# Patient Record
Sex: Female | Born: 1940
Health system: Southern US, Community
[De-identification: ages and names within clinical notes are randomized; demographics above are authoritative.]

## PROBLEM LIST (undated history)

## (undated) DIAGNOSIS — R002 Palpitations: Secondary | ICD-10-CM

## (undated) DIAGNOSIS — K219 Gastro-esophageal reflux disease without esophagitis: Secondary | ICD-10-CM

## (undated) DIAGNOSIS — R413 Other amnesia: Secondary | ICD-10-CM

## (undated) DIAGNOSIS — I10 Essential (primary) hypertension: Secondary | ICD-10-CM

## (undated) DIAGNOSIS — T7840XA Allergy, unspecified, initial encounter: Secondary | ICD-10-CM

## (undated) DIAGNOSIS — I480 Paroxysmal atrial fibrillation: Secondary | ICD-10-CM

## (undated) DIAGNOSIS — E7800 Pure hypercholesterolemia, unspecified: Secondary | ICD-10-CM

## (undated) DIAGNOSIS — R011 Cardiac murmur, unspecified: Secondary | ICD-10-CM

## (undated) HISTORY — DX: Cardiac murmur, unspecified: R01.1

## (undated) HISTORY — DX: Paroxysmal atrial fibrillation: I48.0

## (undated) HISTORY — PX: FRACTURE SURGERY: SHX138

## (undated) HISTORY — PX: BREAST SURGERY: SHX581

## (undated) HISTORY — DX: Allergy, unspecified, initial encounter: T78.40XA

## (undated) HISTORY — DX: Palpitations: R00.2

---

## 1978-09-01 HISTORY — PX: ABDOMINAL HYSTERECTOMY: SHX81

## 2004-11-09 ENCOUNTER — Observation Stay (HOSPITAL_COMMUNITY): Admission: EM | Admit: 2004-11-09 | Discharge: 2004-11-10 | Payer: Self-pay | Admitting: Emergency Medicine

## 2004-11-22 ENCOUNTER — Inpatient Hospital Stay (HOSPITAL_COMMUNITY): Admission: EM | Admit: 2004-11-22 | Discharge: 2004-11-24 | Payer: Self-pay | Admitting: Family Medicine

## 2004-11-23 ENCOUNTER — Encounter (INDEPENDENT_AMBULATORY_CARE_PROVIDER_SITE_OTHER): Payer: Self-pay | Admitting: *Deleted

## 2005-02-14 ENCOUNTER — Ambulatory Visit (HOSPITAL_COMMUNITY): Admission: RE | Admit: 2005-02-14 | Discharge: 2005-02-14 | Payer: Self-pay | Admitting: Gastroenterology

## 2005-02-14 ENCOUNTER — Encounter (INDEPENDENT_AMBULATORY_CARE_PROVIDER_SITE_OTHER): Payer: Self-pay | Admitting: Specialist

## 2006-01-01 ENCOUNTER — Emergency Department (HOSPITAL_COMMUNITY): Admission: EM | Admit: 2006-01-01 | Discharge: 2006-01-01 | Payer: Self-pay | Admitting: Family Medicine

## 2008-01-28 ENCOUNTER — Emergency Department (HOSPITAL_COMMUNITY): Admission: EM | Admit: 2008-01-28 | Discharge: 2008-01-28 | Payer: Self-pay | Admitting: Emergency Medicine

## 2008-07-04 ENCOUNTER — Ambulatory Visit (HOSPITAL_COMMUNITY): Admission: RE | Admit: 2008-07-04 | Discharge: 2008-07-04 | Payer: Self-pay | Admitting: Family Medicine

## 2008-11-30 HISTORY — PX: NM MYOVIEW LTD: HXRAD82

## 2008-12-01 ENCOUNTER — Emergency Department (HOSPITAL_COMMUNITY): Admission: EM | Admit: 2008-12-01 | Discharge: 2008-12-02 | Payer: Self-pay | Admitting: Emergency Medicine

## 2008-12-27 DIAGNOSIS — I480 Paroxysmal atrial fibrillation: Secondary | ICD-10-CM

## 2008-12-27 HISTORY — DX: Paroxysmal atrial fibrillation: I48.0

## 2009-04-04 ENCOUNTER — Emergency Department (HOSPITAL_COMMUNITY): Admission: EM | Admit: 2009-04-04 | Discharge: 2009-04-04 | Payer: Self-pay | Admitting: Emergency Medicine

## 2009-04-05 ENCOUNTER — Ambulatory Visit (HOSPITAL_COMMUNITY): Admission: RE | Admit: 2009-04-05 | Discharge: 2009-04-05 | Payer: Self-pay | Admitting: Emergency Medicine

## 2010-05-13 ENCOUNTER — Emergency Department (HOSPITAL_COMMUNITY): Admission: EM | Admit: 2010-05-13 | Discharge: 2010-05-13 | Payer: Self-pay | Admitting: Emergency Medicine

## 2010-11-14 LAB — POCT CARDIAC MARKERS
CKMB, poc: <1 ng/mL — ABNORMAL LOW (ref 1.0–8.0)
CKMB, poc: <1 ng/mL — ABNORMAL LOW (ref 1.0–8.0)
Myoglobin, poc: 59 ng/mL (ref 12–200)
Myoglobin, poc: 61.3 ng/mL (ref 12–200)
Troponin i, poc: <0.05 ng/mL (ref 0.00–0.09)

## 2010-11-14 LAB — DIFFERENTIAL
Basophils Relative: 0 % (ref 0–1)
Eosinophils Absolute: 0 10*3/uL (ref 0.0–0.7)
Monocytes Relative: 8 % (ref 3–12)
Neutro Abs: 2.5 10*3/uL (ref 1.7–7.7)
Neutrophils Relative %: 66 % (ref 43–77)

## 2010-11-14 LAB — CBC
HCT: 36 % (ref 36.0–46.0)
Hemoglobin: 11.7 g/dL — ABNORMAL LOW (ref 12.0–15.0)
MCH: 25.2 pg — ABNORMAL LOW (ref 26.0–34.0)
MCHC: 32.5 g/dL (ref 30.0–36.0)
MCV: 77.6 fL — ABNORMAL LOW (ref 78.0–100.0)
Platelets: 201 10*3/uL (ref 150–400)
RBC: 4.64 MIL/uL (ref 3.87–5.11)
RDW: 14.5 % (ref 11.5–15.5)
WBC: 3.8 10*3/uL — ABNORMAL LOW (ref 4.0–10.5)

## 2010-11-14 LAB — BASIC METABOLIC PANEL
Chloride: 106 mEq/L (ref 96–112)
GFR calc Af Amer: >60 mL/min (ref >60)
Potassium: 3.9 mEq/L (ref 3.5–5.1)
Sodium: 139 mEq/L (ref 135–145)

## 2010-12-07 LAB — URINE MICROSCOPIC-ADD ON

## 2010-12-07 LAB — CBC
Hemoglobin: 12.1 g/dL (ref 12.0–15.0)
Platelets: 220 10*3/uL (ref 150–400)
RBC: 4.65 MIL/uL (ref 3.87–5.11)
RDW: 14.8 % (ref 11.5–15.5)
WBC: 5.1 10*3/uL (ref 4.0–10.5)

## 2010-12-07 LAB — POCT I-STAT, CHEM 8
BUN: 13 mg/dL (ref 6–23)
Chloride: 105 mEq/L (ref 96–112)
Potassium: 3 mEq/L — ABNORMAL LOW (ref 3.5–5.1)
Sodium: 137 mEq/L (ref 135–145)

## 2010-12-07 LAB — URINALYSIS, ROUTINE W REFLEX MICROSCOPIC
Bilirubin Urine: NEGATIVE
Glucose, UA: NEGATIVE mg/dL
Specific Gravity, Urine: 1.005 (ref 1.005–1.030)

## 2010-12-07 LAB — DIFFERENTIAL
Basophils Absolute: 0 10*3/uL (ref 0.0–0.1)
Basophils Relative: 1 % (ref 0–1)
Lymphocytes Relative: 31 % (ref 12–46)
Neutro Abs: 3.1 10*3/uL (ref 1.7–7.7)
Neutrophils Relative %: 62 % (ref 43–77)

## 2010-12-07 LAB — POCT CARDIAC MARKERS: CKMB, poc: <1 ng/mL — ABNORMAL LOW (ref 1.0–8.0)

## 2010-12-11 LAB — DIFFERENTIAL
Basophils Absolute: 0 10*3/uL (ref 0.0–0.1)
Basophils Relative: 0 % (ref 0–1)
Lymphocytes Relative: 23 % (ref 12–46)
Neutro Abs: 3.5 10*3/uL (ref 1.7–7.7)
Neutrophils Relative %: 70 % (ref 43–77)

## 2010-12-11 LAB — POCT CARDIAC MARKERS
Myoglobin, poc: 83.3 ng/mL (ref 12–200)
Troponin i, poc: <0.05 ng/mL (ref 0.00–0.09)

## 2010-12-11 LAB — CBC
MCV: 79.6 fL (ref 78.0–100.0)
Platelets: 223 10*3/uL (ref 150–400)
WBC: 5.2 10*3/uL (ref 4.0–10.5)

## 2010-12-11 LAB — BASIC METABOLIC PANEL
CO2: 27 mEq/L (ref 19–32)
Calcium: 9.1 mg/dL (ref 8.4–10.5)
Creatinine, Ser: 0.9 mg/dL (ref 0.4–1.2)
GFR calc non Af Amer: >60 mL/min (ref >60)
Glucose, Bld: 96 mg/dL (ref 70–99)
Sodium: 139 mEq/L (ref 135–145)

## 2010-12-15 ENCOUNTER — Other Ambulatory Visit: Payer: Self-pay | Admitting: Emergency Medicine

## 2010-12-15 ENCOUNTER — Emergency Department (HOSPITAL_COMMUNITY): Payer: Medicare Other

## 2010-12-15 ENCOUNTER — Inpatient Hospital Stay (HOSPITAL_COMMUNITY)
Admission: EM | Admit: 2010-12-15 | Discharge: 2010-12-16 | DRG: 287 | Disposition: A | Discharge status: Home / Self Care | Payer: Medicare Other | Attending: Cardiology | Admitting: Cardiology

## 2010-12-15 DIAGNOSIS — I10 Essential (primary) hypertension: Secondary | ICD-10-CM | POA: Diagnosis present

## 2010-12-15 DIAGNOSIS — I4891 Unspecified atrial fibrillation: Secondary | ICD-10-CM | POA: Diagnosis present

## 2010-12-15 DIAGNOSIS — E785 Hyperlipidemia, unspecified: Secondary | ICD-10-CM | POA: Diagnosis present

## 2010-12-15 DIAGNOSIS — K219 Gastro-esophageal reflux disease without esophagitis: Secondary | ICD-10-CM | POA: Diagnosis present

## 2010-12-15 DIAGNOSIS — N390 Urinary tract infection, site not specified: Secondary | ICD-10-CM | POA: Diagnosis present

## 2010-12-15 DIAGNOSIS — I200 Unstable angina: Principal | ICD-10-CM | POA: Diagnosis present

## 2010-12-15 DIAGNOSIS — Z7982 Long term (current) use of aspirin: Secondary | ICD-10-CM

## 2010-12-15 DIAGNOSIS — Z8249 Family history of ischemic heart disease and other diseases of the circulatory system: Secondary | ICD-10-CM

## 2010-12-15 LAB — CBC
MCH: 25.1 pg — ABNORMAL LOW (ref 26.0–34.0)
MCHC: 32.2 g/dL (ref 30.0–36.0)
Platelets: 208 10*3/uL (ref 150–400)

## 2010-12-15 LAB — CARDIAC PANEL(CRET KIN+CKTOT+MB+TROPI)
CK, MB: 0.5 ng/mL (ref 0.3–4.0)
CK, MB: 0.5 ng/mL (ref 0.3–4.0)
Troponin I: 0.11 ng/mL — ABNORMAL HIGH (ref 0.00–0.06)

## 2010-12-15 LAB — LIPID PANEL
Cholesterol: 158 mg/dL (ref 0–200)
LDL Cholesterol: 89 mg/dL (ref 0–99)
Triglycerides: 46 mg/dL (ref <150)

## 2010-12-15 LAB — DIFFERENTIAL
Basophils Absolute: 0 10*3/uL (ref 0.0–0.1)
Lymphocytes Relative: 30 % (ref 12–46)
Neutro Abs: 2 10*3/uL (ref 1.7–7.7)

## 2010-12-15 LAB — BASIC METABOLIC PANEL
BUN: 13 mg/dL (ref 6–23)
Creatinine, Ser: 0.85 mg/dL (ref 0.4–1.2)
GFR calc non Af Amer: >60 mL/min (ref >60)

## 2010-12-15 LAB — URINE MICROSCOPIC-ADD ON

## 2010-12-15 LAB — HEPARIN LEVEL (UNFRACTIONATED): Heparin Unfractionated: 0.97 IU/mL — ABNORMAL HIGH (ref 0.30–0.70)

## 2010-12-15 LAB — CK TOTAL AND CKMB (NOT AT ARMC): Relative Index: INVALID (ref 0.0–2.5)

## 2010-12-15 LAB — URINALYSIS, ROUTINE W REFLEX MICROSCOPIC
Nitrite: NEGATIVE
Protein, ur: 30 mg/dL — AB
Urobilinogen, UA: 1 mg/dL (ref 0.0–1.0)

## 2010-12-15 LAB — POCT CARDIAC MARKERS
CKMB, poc: <1 ng/mL — ABNORMAL LOW (ref 1.0–8.0)
Troponin i, poc: <0.05 ng/mL (ref 0.00–0.09)

## 2010-12-15 LAB — APTT: aPTT: 28 seconds (ref 24–37)

## 2010-12-15 LAB — PROTIME-INR: Prothrombin Time: 13 seconds (ref 11.6–15.2)

## 2010-12-15 LAB — MAGNESIUM: Magnesium: 2.2 mg/dL (ref 1.5–2.5)

## 2010-12-15 LAB — HEMOGLOBIN A1C: Hgb A1c MFr Bld: 5.6 % (ref <5.7)

## 2010-12-15 LAB — BRAIN NATRIURETIC PEPTIDE: Pro B Natriuretic peptide (BNP): 45 pg/mL (ref 0.0–100.0)

## 2010-12-15 MED ORDER — IOHEXOL 300 MG/ML  SOLN: 100.0000 mL | Freq: Once | INTRAMUSCULAR | Status: AC | PRN

## 2010-12-16 HISTORY — PX: LEFT HEART CATH AND CORONARY ANGIOGRAPHY: CATH118249

## 2010-12-16 HISTORY — PX: CARDIAC CATHETERIZATION: SHX172

## 2010-12-16 LAB — URINE CULTURE: Culture  Setup Time: 201204151720

## 2010-12-16 LAB — CARDIAC PANEL(CRET KIN+CKTOT+MB+TROPI)
CK, MB: 0.5 ng/mL (ref 0.3–4.0)
Relative Index: INVALID (ref 0.0–2.5)
Troponin I: 0.02 ng/mL (ref 0.00–0.06)

## 2010-12-16 LAB — BASIC METABOLIC PANEL
BUN: 6 mg/dL (ref 6–23)
CO2: 26 mEq/L (ref 19–32)
Calcium: 8.9 mg/dL (ref 8.4–10.5)
Creatinine, Ser: 1.02 mg/dL (ref 0.4–1.2)
GFR calc Af Amer: >60 mL/min (ref >60)
Glucose, Bld: 97 mg/dL (ref 70–99)

## 2010-12-16 LAB — CBC
HCT: 33 % — ABNORMAL LOW (ref 36.0–46.0)
Hemoglobin: 10.8 g/dL — ABNORMAL LOW (ref 12.0–15.0)
MCHC: 32.7 g/dL (ref 30.0–36.0)

## 2010-12-16 LAB — PROTIME-INR: Prothrombin Time: 14 seconds (ref 11.6–15.2)

## 2010-12-17 ENCOUNTER — Emergency Department (HOSPITAL_COMMUNITY): Payer: Medicare Other

## 2010-12-17 ENCOUNTER — Observation Stay (HOSPITAL_COMMUNITY)
Admission: EM | Admit: 2010-12-17 | Discharge: 2010-12-18 | Disposition: A | Discharge status: Home / Self Care | Payer: Medicare Other | Source: Ambulatory Visit | Attending: Cardiovascular Disease | Admitting: Cardiovascular Disease

## 2010-12-17 DIAGNOSIS — E785 Hyperlipidemia, unspecified: Secondary | ICD-10-CM | POA: Insufficient documentation

## 2010-12-17 DIAGNOSIS — I10 Essential (primary) hypertension: Secondary | ICD-10-CM | POA: Insufficient documentation

## 2010-12-17 DIAGNOSIS — R42 Dizziness and giddiness: Secondary | ICD-10-CM | POA: Insufficient documentation

## 2010-12-17 DIAGNOSIS — K219 Gastro-esophageal reflux disease without esophagitis: Secondary | ICD-10-CM | POA: Insufficient documentation

## 2010-12-17 DIAGNOSIS — I4891 Unspecified atrial fibrillation: Principal | ICD-10-CM | POA: Insufficient documentation

## 2010-12-17 LAB — POCT CARDIAC MARKERS
CKMB, poc: <1 ng/mL — ABNORMAL LOW (ref 1.0–8.0)
Myoglobin, poc: 63.6 ng/mL (ref 12–200)

## 2010-12-17 LAB — DIFFERENTIAL
Basophils Absolute: 0 10*3/uL (ref 0.0–0.1)
Eosinophils Absolute: 0 10*3/uL (ref 0.0–0.7)
Eosinophils Relative: 1 % (ref 0–5)

## 2010-12-17 LAB — COMPREHENSIVE METABOLIC PANEL
Albumin: 3.4 g/dL — ABNORMAL LOW (ref 3.5–5.2)
BUN: 4 mg/dL — ABNORMAL LOW (ref 6–23)
Chloride: 112 mEq/L (ref 96–112)
Creatinine, Ser: 1.07 mg/dL (ref 0.4–1.2)
Glucose, Bld: 101 mg/dL — ABNORMAL HIGH (ref 70–99)
Total Bilirubin: 0.6 mg/dL (ref 0.3–1.2)

## 2010-12-17 LAB — MAGNESIUM: Magnesium: 2.5 mg/dL (ref 1.5–2.5)

## 2010-12-17 LAB — CBC
MCHC: 32.9 g/dL (ref 30.0–36.0)
MCV: 77.5 fL — ABNORMAL LOW (ref 78.0–100.0)
Platelets: 208 10*3/uL (ref 150–400)
RDW: 14.5 % (ref 11.5–15.5)
WBC: 5 10*3/uL (ref 4.0–10.5)

## 2010-12-17 LAB — APTT: aPTT: 33 seconds (ref 24–37)

## 2010-12-17 LAB — PROTIME-INR: INR: 0.99 (ref 0.00–1.49)

## 2010-12-18 LAB — PROTIME-INR
INR: 1.13 (ref 0.00–1.49)
Prothrombin Time: 14.7 seconds (ref 11.6–15.2)

## 2010-12-19 NOTE — Cardiovascular Report (Signed)
NAME:  Donna Simmons, Donna Simmons NO.:  [id number]  MEDICAL RECORD NO.:  [id number]           PATIENT TYPE:  I  LOCATION:  6522                         FACILITY:  [facility]  PHYSICIAN:  Landry Corporal, MD DATE OF BIRTH:  20-Mar-1941  DATE OF PROCEDURE: DATE OF DISCHARGE:  12/16/2010                           CARDIAC CATHETERIZATION   PRIMARY CARDIOLOGIST:  Landry Corporal, MD.  PROCEDURE PERFORMED: 1. Left heart catheterization via 5-French right radial access. 2. Left ventriculography in the RAO projection with 10 mL of contrast     per second for 30 seconds. 3. Native coronary angiography.  INDICATIONS:  Chest pain concerning for unstable angina.  BRIEF HISTORY:  Donna Simmons is a 70 year old woman with history of paroxysmal atrial fibrillation who presented with two separate episodes of chest pain, the most recent one being at rest lasting 10 minutes which prompted a call from the emergency room.  Secondary to cardiac biomarkers, she had a mild elevation in her troponin of 0.1, which was concerning for Non-STEMI.  She was treated accordingly, and I am referred for diagnostic cardiac catheterization.  The risks, benefits, alternatives, and indications of procedure were explained to the patient in detail, was obtained with a signed form placed on the chart.  PROCEDURE:  The patient was brought from the second floor [facility] Cardiac Catheterization lab, prepped and draped in usual sterile fashion.  A modified Allen/Barbeau test was performed on the right radial artery and right ulnar artery demonstrating adequate collateral flow.  She was then draped for the radial access.  After time-out period was performed, the patient was sedated with intravenous Versed and fentanyl in the right radial artery and was accessed using the Seldinger technique after 1% subcutaneous lidocaine used for sedation for local anesthesia.  After the sheath was placed, a total of 10 mL  of radial cocktail was infused, and then a total of 3500 units of heparin was administered intravenously.  A 5-French TIG 4.0 catheter was advanced over safety J-wire into the ascending aorta.  Multiple angiographic views of first right and left coronary artery system were obtained.  Catheter was then removed, exchanged over wire for 5-French angled pigtail catheter, which was then advanced across the aortic valve.  Left ventricle hemodynamics were measured and then a left ventriculogram was performed in the RAO projection.  After completion, left ventricular pressure was then remeasured, and the catheter was pulled back across the aortic valve measuring pullback gradient.  The wires were removed completely out of the body over wire without any complications.    The sheath was then removed in the cath lab with a TR band placed at 12 mL of contrast at 11:10 a.m.  The patient was stable before, during, and after the procedure.  There was excellent reverse Allen with the TR band placed.  CATHETERIZATION STATISTICS: 1. Sedation.  Versed 1 mg. 2. Fentanyl 25 mcg. 3. Heparin 3500 units. 4. Radial cocktail was 400 mcg of nitroglycerin, 5 mg of verapamil,     and 2 mL of 1% lidocaine.  HEMODYNAMICS: 1. Left ventricular pressure 119/20 mmHg, EDP of 40 mmHg. 2.  Aortic pressure 114/61 mmHg with mean pressure of 84 mmHg. 3. Left ventriculogram showed EF of 55% with no wall motion     abnormality.  ANGIOGRAPHIC FINDINGS: 1. Left main is a large caliber long vessel.  It bifurcates in a very     sharp 90-degree angles with LAD and circumflex with no significant     disease. 2. LAD takes a very sharp 90-degree bend of the left main that gives     rise to several septal perforators and two diagonal branches.     There is no significant disease entirely in this system. 3. Left circumflex again takes 90 degrees takeoff of very tortuous at     the atrioventricular groove.  There are three obtuse  marginals with     the last one being bifurcating.  Small atrioventricular groove     vessel that goes to posterolateral system.  There is no disease in     the circumflex system. 4. The right coronary artery is a large-caliber dominant vessel that     gives rise to a bifurcating PDA and posterolateral branches with no     significant disease.  IMPRESSION: 1. No evidence of any coronary artery disease to explain the anginal     pain with positive troponin. 2. Normal left ventricular ejection fraction with no wall motion     abnormalities and normal EDP.  PLAN: 1. Continue optimized medical management.  We will add nitrate for     possible spasm. 2. Also, potential etiology is breakthrough Atrial Fibrillation.  Will      likely need beta blocker 2. We will likely discharge later on today.  Follow up with me at     [facility].          ______________________________ Landry Corporal, MD     DWH/MEDQ  D:  12/16/2010  T:  12/17/2010  Job:  [id number]  Electronically Signed by Bryan Lemma MD on 12/19/2010 07:12:09 PM

## 2010-12-19 NOTE — Discharge Summary (Signed)
  NAME:  Donna Simmons, Donna Simmons NO.:  [id number]  MEDICAL RECORD NO.:  [id number]           PATIENT TYPE:  I  LOCATION:  6522                         FACILITY:  [facility]  PHYSICIAN:  Landry Corporal, MD DATE OF BIRTH:  Mar 09, 1941  DATE OF ADMISSION:  12/15/2010 DATE OF DISCHARGE:  12/16/2010                              DISCHARGE SUMMARY   DISCHARGE DIAGNOSES: 1. Chest pain worrisome for unstable angina, no significant coronary     artery disease at catheterization. 2. Elevated D-dimer on admission with no evidence of pulmonary     embolism by CT scan. 3. Treated hypertension. 4. History of paroxysmal atrial fibrillation, holding sinus rhythm on     flecainide.  The patient is not on Coumadin, she is on aspirin. 5. History of gastroesophageal reflux. 6. Treated hypertension. 7. Possible urinary tract infection, culture is pending.  HOSPITAL COURSE:  The patient is a 70 year old female followed previously by Dr. Elsie Lincoln.  She has a history of PAF.  She has been holding sinus rhythm on flecainide.  She is on aspirin daily.  She presented to the emergency room on December 15, 2010, with chest pain and palpitations.  Her EKG showed sinus rhythm.  She was put on heparin and nitrates.  One of five troponins was 0.11.  EKG showed sinus rhythm without acute changes.  D-dimer was 0.63, CT scan showed no evidence of pulmonary embolism.  She was taken to the cath lab December 16, 2010, and catheterization done by Dr. Herbie Baltimore, the right radial artery showed normal coronaries with an EF of 65%.  Nitrates were added for possible coronary spasm.  We feel she can be discharged on December 16, 2010. Please see med rec for complete discharge medications.  LABORATORY DATA:  CT scan shows no evidence of pulmonary embolism. Chest x-ray shows no significant disease.  Sodium 139, potassium 3.6, BUN 6, creatinine 1.02.  TSH 1.03.  Urinalysis shows leukocytes and few bacteria and nitrites are  negative.  Culture is pending.  Cholesterol 158, HDL 60, LDL 89.  White count 3.5, hemoglobin 10.9, hematocrit 33.8, platelets 208, INR 0.96.  DISPOSITION:  The patient discharged in stable condition.  We will follow up with Dr. Herbie Baltimore in 1-2 weeks.  If possible, she may have had breakthrough PAF to account for symptoms and her troponin.  We have also structured that she can take an extra half metoprolol 25 mg for palpitations.  CK-MBs are negative.  One of five troponins was positive at 0.11, all others were negative.  Cholesterol is 158 with triglyceride of 46, HDL 60, LDL 99.  TSH 1.03.  Urinalysis showed moderate blood with leukocytes, and nitrites were negative.  Urine culture is pending.  She was put on Septra and will continue with a 3- day course.     Abelino Derrick, P.A.   ______________________________ Landry Corporal, MD    LKK/MEDQ  D:  12/16/2010  T:  12/17/2010  Job:  [id number]  Electronically Signed by Corine Shelter P.A. on 12/17/2010 03:43:08 PM Electronically Signed by Bryan Lemma MD on 12/19/2010 07:10:34 PM

## 2010-12-23 NOTE — Discharge Summary (Signed)
NAME:  Donna Simmons, Donna Simmons               ACCOUNT NO.:  [id number]  MEDICAL RECORD NO.:  [id number]           PATIENT TYPE:  O  LOCATION:  3706                         FACILITY:  [facility]  PHYSICIAN:  Italy Hilty, MD         DATE OF BIRTH:  [birth year]  DATE OF ADMISSION:  12/17/2010 DATE OF DISCHARGE:  12/18/2010                              DISCHARGE SUMMARY   DISCHARGE DIAGNOSES: 1. Atrial fibrillation with rapid ventricular response, converted to     normal sinus rhythm in the emergency room on December 17, 2010. 2. Hypertension, controlled and treated. 3. History of dyslipidemia. 4. No significant coronary artery disease, recently cathed on December 16, 2010. 5. History of gastroesophageal reflux disease. 6. History of peptic ulcer disease.  HOSPITAL COURSE:  Donna Simmons is a 70 year old African American female with history of paroxysmal atrial fibrillation, hypertension, dyslipidemia, gastroesophageal reflux disease, peptic ulcer disease, and anemia.  She was recently discharged on December 16, 2010 after having cardiac catheterization which revealed no significant coronary artery disease.  She had been admitted on that occasion for chest pain and increased heart rate with no atrial fibrillation has been identified. She currently takes flecainide 50 mg daily and aspirin 325.  She presented back to the hospital on December 17, 2010 with heart rate she states it was around 200s.  She called EMS and upon arrival in the emergency room, she was found to be in atrial fibrillation with rapid ventricular response.  She was given 5 mg of IV metoprolol.  She was spontaneously converted back to normal sinus rhythm, rate of 70s-80s. She was admitted for observation overnight.  Metoprolol was increased to 50 mg b.i.d.  Chest x-ray revealed no acute or active cardiopulmonary process.  Her CK-MB POC and troponin POC were negative.  Currently, the patient is stable without chest pain.  She was no further  episodes of atrial fibrillation overnight with blood pressure this morning was 96/66 with a heart rate of 60.  I am going to back down on the succinate to 25 mg once daily, I am going to increase her flecainide to 50 mg b.i.d. and discontinue her hydrochlorothiazide at this time.  She also will be started on Coumadin, though her CHADS score in the range of 1-2.  She has seen by Dr. Rennis Golden, feels she is stable for discharge.  She will follow up Dr. Herbie Baltimore on December 24, 2010 and Cou[facility] Clinic on Friday, December 19, 2008.  DISCHARGE LABS:  Hemoglobin 12.0, hematocrit 36.5, WBCs 5.0, platelets 208.  Sodium 141 potassium 3.7, chloride 112, carbon dioxide 24, BUN 4, creatinine 1.07, glucose 101.  PT 14.7, INR 1.13.  T-bili 0.6, alk phos 49, AST 18, ALT 14, total protein 6.4, albumin 3.4, calcium 8.7, magnesium 2.5.  CK-MB POC was less than 1.0, troponin POC less than 0.05, and the myoglobin POC was 63.6.  STUDIES/PROCEDURES:  Chest x-ray showed no acute cardiopulmonary process.  DISPOSITION:  Donna Simmons will be discharged home in stable condition. She can resume normal activities.  It is recommend she eats a heart- healthy diet.  She will follow up with Dr. Herbie Baltimore on December 24, 2010 and at the Coumadin Clinic at [facility] on Friday at 9:45 a.m.    ______________________________ Wilburt Finlay, PA   ______________________________ [place] Hilty, MD    BH/MEDQ  D:  12/18/2010  T:  12/18/2010  Job:  [id number]  cc:   Landry Corporal, MD  Electronically Signed by Wilburt Finlay PA on 12/23/2010 10:54:03 AM Electronically Signed by Kirtland Bouchard. HILTY M.D. on 12/23/2010 01:37:55 PM

## 2011-01-02 NOTE — H&P (Signed)
NAME:  Donna Simmons, Donna Simmons NO.:  [id number]  MEDICAL RECORD NO.:  [id number]           PATIENT TYPE:  E  LOCATION:  [facility]                         FACILITY:  [facility]  PHYSICIAN:  Landry Corporal, MD DATE OF BIRTH:  [birth year]  DATE OF ADMISSION:  12/15/2010 DATE OF DISCHARGE:                             HISTORY & PHYSICAL   CHIEF COMPLAINT:  Chest pain.  HISTORY OF PRESENT ILLNESS:  Donna Simmons is a very pleasant 70 year old African American female with history of paroxysmal atrial fibrillation, currently controlled on flecainide.  She has a history of gastroesophageal reflux disease, as well as peptic ulcer disease, hypertension, and dyslipidemia who presents to the emergency department with acute onset of chest discomfort which occurred last evening or early this morning at around 12:00 a.m. while she was at rest.  She was watching TV and developed left anterior chest pain which radiated into her neck.  This was much different than the discomfort she has experienced in the past with her GERD which is usually substernal and her heart rate was regular; therefore, she knew she was not in AFib. The discomfort lasted about 15 minutes and because it was so different from what she has experienced in the past, she called EMS for transport to [facility].  En route to the hospital, she received sublingual nitroglycerin with complete relief of her chest discomfort.  On arrival, her EKG revealed sinus tachycardia at 114 without acute ST-T wave changes.  She had Nitropaste placed, and she has been pain free since her arrival.  Her cardiac enzymes, point-of-care markers have been negative.  She denies any associated shortness of breath.  No nausea or diaphoresis.  She has not experienced any exertional chest discomfort. She denies any fevers or chills.  No cough or upper respiratory infection.  She denies any urgency, frequency, hematuria, or dysuria. At present, she has no  other complaints.  PAST MEDICAL HISTORY: 1. Paroxysmal atrial fibrillation, treated with flecainide, currently     in sinus rhythm. 2. Hypertension. 3. Dyslipidemia. 4. Gastroesophageal reflux disease. 5. Anemia. 6. History of peptic ulcer disease.  FAMILY HISTORY:  Positive for coronary artery disease in both her parents and grandparents.  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  She denies any tobacco or alcohol use.  ALLERGIES:  None known.  CURRENT MEDICATIONS:  Aspirin 81 mg daily, flecainide, Lipitor, lisinopril, hydrochlorothiazide, and omeprazole, doses are unknown at this time.  REVIEW OF SYSTEMS:  As per HPI, otherwise negative.  PHYSICAL EXAMINATION:  VITAL SIGNS:  Blood pressure is 119/67, pulse is 61 and regular, respirations 16, pulse ox is 95%. GENERAL:  This is a very pleasant 70 year old [place] American female, in no acute distress. HEENT:  Pupils are equal and reactive to light and accommodation. Extraocular movements intact.  Sclerae are nonicteric.  Conjunctivae are pink. NECK:  Supple with no JVD, no carotid bruit or thyromegaly. CARDIOVASCULAR:  Regular rate and rhythm.  S1 and S2 without appreciable murmur, gallop, or rub. LUNGS:  Clear to auscultation bilaterally with normal respiratory effort. ABDOMEN:  Soft, nontender without hepatosplenomegaly or masses. EXTREMITIES:  Radial, femoral, dorsal pedal arteries are present.  No lower extremity edema.  No clubbing, cyanosis, or ulcers. NEUROLOGIC:  Oriented to person, place, and time.  Normal mood and affect.  LABORATORY DATA:  Myoglobin is 26.4, CK-MB is less than 1.0.  Troponin is less than 0.05.  White blood cell count is 3.5, hemoglobin is 10.9, hematocrit 33.8, platelets are 208.  PT is 13, INR is 0.96.  Sodium is 140, potassium is 3.3, BUN is 13, creatinine is 0.85.  Total CK is 60, CK-MB  is 0.4, and troponin is 0.01.  Urine revealed moderate hemoglobin with small leukoesterase with 7-10 wbc's.  D-dimer was elevated, and CT angiogram was performed without evidence of pulmonary embolus.  There is dependent atelectasis in the lung bases.  IMPRESSION: 1. Chest pain, unstable angina. 2. Hypertension, controlled. 3. Dyslipidemia, treated. 4. Paroxysmal atrial fibrillation, currently in normal sinus rhythm,     on flecainide. 5. Gastroesophageal reflux disease. 6. Peptic ulcer disease. 7. Anemia. 8. Urinary tract infection.  We will treat her with Bactrim DS one     p.o. b.i.d. and place her on Protonix 40 mg b.i.d.  PLAN:  We will admit to telemetry and rule out for myocardial infarction.  We will start IV heparin with no bolus in the low therapeutic range.  We will continue with the Nitropaste, aspirin, and a low-dose beta-blocker.  We will continue with her lisinopril, flecainide, and Lipitor.  We will follow up her labs as well as her symptoms and determine whether cath or Myoview will be appropriate to further evaluate her chest discomfort.    ______________________________ Donna College, NP  I saw and examined the patient in the AM after initial stablization.  I agree with Lenae's findings, assessment and plan.  See handwritten notes for my full attestation.   ______________________________ Landry Corporal, MD    LS/MEDQ  D:  12/15/2010  T:  12/15/2010  Job:  [id number]  cc:   [facility]  Electronically Signed by Charmian Muff NP on 01/02/2011 05:26:41 PM Electronically Signed by Bryan Lemma MD on 01/02/2011 07:40:48 PM

## 2011-01-17 NOTE — Discharge Summary (Signed)
NAME:  Donna Simmons, Donna Simmons               ACCOUNT NO.:  [id number]   MEDICAL RECORD NO.:  [id number]          PATIENT TYPE:  INP   LOCATION:  5506                         FACILITY:  [facility]   PHYSICIAN:  Mallory Shirk, MD     DATE OF BIRTH:  1941/08/25   DATE OF ADMISSION:  11/22/2004  DATE OF DISCHARGE:  11/24/2004                                 DISCHARGE SUMMARY   DISCHARGE DIAGNOSES:  1.  Anemia.  2.  Gastric ulcers.  3.  Paroxysmal atrial fibrillation.  4.  Hypertension.   MEDICATIONS ON DISCHARGE:  1.  Lisinopril HCTZ 10/12.5 one tablet  p.o. daily.  2.  Toprol XL 12.5 mg p.o. daily.  3.  Protonix 40 mg p.o. daily x1 month.   FOLLOW-UP APPOINTMENTS:  1.  With Dr. Anselmo Rod of gastroenterology next week.  2.  With Dr. Dorothyann Peng, primary care physician within 7-10 days of      discharge.   HISTORY OF PRESENT ILLNESS:  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 Truett Perna  office.  She was also complaining of feeling tired over the two days prior  to admission.  No bright red blood per rectum or any hematemesis.  The  patient denied any abdominal pain.  Did notice some black stool but  otherwise had no other complaints, no nausea and vomiting or diarrhea.   PAST MEDICAL HISTORY:  1.  Gastroesophageal reflux disease.  2.  Paroxysmal atrial fibrillation.  3.  Hypertension.   MEDICATIONS ON ADMISSION:  1.  Lisinopril HCTZ 10/12.5 one tablet p.o. daily.  2.  Toprol XL 12.5 mg p.o. daily.  3.  Aspirin 81 mg p.o. daily.   ALLERGIES:  No known drug allergies.   PHYSICAL EXAMINATION ON ADMISSION:  VITAL SIGNS:  Blood pressure 125/80,  pulse 98, respiratory rate 18, saturations 100% on room air, temperature  97.8.  GENERAL:  The patient alert and oriented in  no acute distress.  HEENT:  Normocephalic, atraumatic.  PERRLA.  Sclerae anicteric.  Mucous  membranes moist.  NECK:  Supple.  No LAD.  No JVD.  LUNGS:  Clear to auscultation bilaterally.  No wheezes, no rales.  CARDIOVASCULAR:  S1 plus S2.  Regular rate and rhythm.  No murmurs, rubs or  gallops.  ABDOMEN:  Soft.  Positive bowel sounds.  No tenderness.  No masses.  Guaiac-  positive stool.  Dark and melanotic in color.  EXTREMITIES:  No cyanosis, clubbing or edema.   LABORATORIES ON ADMISSION:  WBC 7.3, hemoglobin 8.2, hematocrit 24.7,  platelets 342,000.  Sodium 136, potassium 3.9, chloride 101, carbon dioxide  27, glucose 99, BUN 17, creatinine 1.0, calcium 8.9, total protein 6.5,  albumin 3.6, AST 19, ALT 16, alkaline phosphatase 63, total bilirubin 0.4,  lipase 25.   HOSPITAL COURSE:  The patient was admitted to the floor.  She was  transfused:  1.  Anemia.  She was transfused two units of packed red blood cells.  The      patient was seen by Dr. Anselmo Rod, gastroenterology.  EGD showed      multiple antral ulcers with no visible vessel seen.  Biopsies for      Helicobacter pylori have been done.  Results are pending.  After this      procedure, the plan was to do a colonoscopy if the patient continued to      lose blood or do the colonoscopy on an outpatient basis next week.  The      patient's hemoglobin and hematocrit has remained stable after the      transfusion. This a.m., the hemoglobin and hematocrit was 10.1/29.7.      The patient will be seen by Dr. Loreta Ave next week for colonoscopy.  There      were no episodes of active bleeding during the hospital stay.  2.  Paroxysmal atrial fibrillation.  The patient is in normal sinus rhythm.      No events during the hospital stay.  Rate controlled with Toprol.  3.  Hypertension.  The patient's blood pressure today is 121/68.  Her Toprol      was resumed.  4.  Gastroesophageal reflux disease.  The patient was on Protonix 40 mg  p.o.      daily.  She will be discharged on this dose.   The patient was discharged in stable condition.  She will be followed up by  Dr. Loreta Ave and Dr. Allyne Gee.      GDK/MEDQ  D:  11/24/2004  T:  11/24/2004  Job:  [id number]   cc:   Candyce Churn. Allyne Gee, M.D.  [address]  [address]  [place]  [place] [address]  Fax: [phone]   Anselmo Rod, M.D.  [address]  [address]  [place]  [place] [address]  Fax: [phone]   Madaline Savage, M.D.  [phone]  [place]  [place] [address]  Fax: [phone]

## 2011-01-17 NOTE — H&P (Signed)
NAME:  Donna Simmons, Donna Simmons               ACCOUNT NO.:  [id number]   MEDICAL RECORD NO.:  [id number]          PATIENT TYPE:  INP   LOCATION:  5506                         FACILITY:  [facility]   PHYSICIAN:  Lonia Blood, M.D.      DATE OF BIRTH:  05/14/1941   DATE OF ADMISSION:  11/22/2004  DATE OF DISCHARGE:                                HISTORY & PHYSICAL   PRIMARY CARE PHYSICIAN:  Robyn N. Allyne Gee, M.D.   CARDIOLOGIST:  Madaline Savage, M.D.   PRESENTING COMPLAINT:  Weakness.   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 During that  visit the patient's hemoglobin was found to be 12. She had some heparin  started by it was discontinued prior to her discharge. The patient has been  taking aspirin 81 mg daily.  She later had follow up with Dr. Elsie Lincoln in his office this week where her  blood work was repeated and her hemoglobin was found to be 7.8 (today). The  patient was subsequently asked to return to the emergency room where her  hemoglobin was found to be 8.2. Per patient, she has been feeling tired and  weak over the past couple of days. She noticed her tiredness when she was  going up the stairs. She denied any abdominal pain, bright red blood per  rectum, or ANSAID use. The patient had noticed some black stool yesterday  but has not seen any today. Denied any nausea, vomiting, hematemesis, or  hemoptysis.   PAST MEDICAL HISTORY:  1.  GERD.  2.  Atrial fibrillation.  3.  Hypertension.   MEDICATIONS:  1.  Lisinopril & hydrochlorothiazide 10/12.5 mg.  2.  Toprol XL 12.5 mg daily.  3.  Aspirin 81 mg daily.   ALLERGIES:  The patient has no known drug allergies.   SOCIAL HISTORY:  The patient is a professor at [employer]. 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 She denied any tobacco or alcohol use. She currently lives here in  [place] with her husband after moving from [place] about 7 months ago  (she is originally from [place]).   FAMILY HISTORY:  Pertinet for heart disease in both her parents and  grandparents.   REVIEW OF SYSTEMS:  Essentially all systems reviewed and stable as in HPI.   PHYSICAL EXAMINATION:  VITAL SIGNS:  Temperature is 97.8, blood pressure  125/80, pulse of 98, respiratory rate 18, saturations 100% on room air. The  patient is not orthostatic.  GENERAL:  The patient is alert and oriented, in no acute distress.  HEENT:  Pupils are equal, round and reactive to light. EOMI. Mild  conjunctival pallor but no jaundice.  NECK:  Supple, no JVD, no lymphadenopathy.  RESPIRATORY:  The patient has good air entry bilaterally. No wheezes or  rales.  CARDIOVASCULAR:  Regular rate and rhythm.  ABDOMEN:  Soft, nontender with positive bowel sounds. The patient is guaiac  positive with stool being dark and melanotic in nature.  EXTREMITIES:  Show no edema, cyanosis or clubbing.   LABORATORY DATA:  White count 7.3 thousand, hemoglobin 8.2 with hematocrit  24.7, and normal differential.  RDW is 15.8. Her platelet count is 342,000. Sodium is 136, potassium 3.9,  chloride 101, CO2 27, glucose 99, BUN is 17, creatinine 1.0, calcium 8.9.  total protein 6.5, albumin 3.6, AST 19, ALT 16, alkaline phosphatase 63,  total bilirubin 0.4. She has a normal lipase at 25. Her urinalysis is  essentially normal.   ASSESSMENT:  This is a 70 year old female presenting with GI bleeding and  anemia of unknown cause. The patient denied any trigger, including NSAID  use. She also has no abdominal pain, no nausea or vomiting. Based on her  black stool the chances are this is upper GI or right colon. Differentials  are many. Since patient had no symptoms chances are this is  not peptic ulcer  disease. She however has history of GERD. She is taking aspirin, although  very low dose, could be responsible for some gastritis. Other possibilities  are right-sided colon polyps. The patient could also have some diverticular  disease although this is giving black stool rather than bright red blood per  rectum. The patient would definitely benefit from endoscopy of the colon.  Will therefore proceed as follows:   1.  For GI bleed will admit her to a monitored bed. Will type and cross-      match her for up to four units of packed red blood cells. Will try      transfusing her if her hemoglobin drops to less than eight. Will get a      wide bore IVs and check her hemoglobin q.4h serially. Will also put her      on a proton pump inhibitor and go ahead and call a GI consult. Dr. Loreta Ave      has agreed to see the patient. In the meantime patient will be n.p.o.      for her procedure tomorrow.   1.  For her hypertension I will hold her medications and will use Lopressor      intravenously if needed for systolic above 160.   1.  Atrial fibrillation - the patient's EKG showed normal sinus rhythm at      this point and her rate is 81 which means she is rate controlled. Will      therefore not intervene at this point, will just continue to monitor the      patient closely.      LG/MEDQ  D:  11/22/2004  T:  11/23/2004  Job:  [id number]

## 2011-01-17 NOTE — Discharge Summary (Signed)
NAME:  Donna Simmons, Donna Simmons               ACCOUNT NO.:  [id number]   MEDICAL RECORD NO.:  [id number]          PATIENT TYPE:  OBV   LOCATION:  3735                           FACILITY:   PHYSICIAN:  Madaline Savage, M.D.DATE OF BIRTH:  05/05/1941   DATE OF ADMISSION:  11/09/2004  DATE OF DISCHARGE:  11/10/2004                                 DISCHARGE SUMMARY   ADMISSION DIAGNOSES:  1.  Recurrent atrial fibrillation.  2.  Hypertension.  3.  Mild chest discomfort at the time of atrial fibrillation.  4.  Mild hypokalemia.   DISCHARGE DIAGNOSES:  1.  Recurrent atrial fibrillation, resolved. Maintaining sinus rhythm.  2.  Hypertension.  3.  Mild chest discomfort at the time of atrial fibrillation.  4.  Mild hypokalemia.   HISTORY OF PRESENT ILLNESS:  Donna Simmons is a 70 year old African-American  female with a history of hypertension and PAF who experiences PAF about  every 3 to 4 months which usually lasts about 15 minutes and then resolves  with Valsalva maneuvers, Toprol, or both, and usually are not significantly  symptomatic. Coumadin was stopped in September 2005 by Dr. Elsie Lincoln due to the  fact that she was having self-limited PAF and relatively asymptomatic. She  had a Cardiolite scan September 2005 and was reportedly okay. We do not have  that record as it was the weekend.   On the morning of admission, she felt herself go into atrial fibrillation  upon awakening. This was associated with an episode of chest tightness.  There was no radiation of the tightness, no associated shortness of breath,  nausea, vomiting, or diaphoresis. The atrial fibrillation/palpitations  persisted despite her taking Toprol and using Valsalva maneuvers. Therefore,  she came to [facility] ER. She was given IV Lopressor in the ER earlier that  morning. Heart rate decreased from 130 beats a minute to 80 beats a minute,  and her chest tightness resolved. At the time of Dr. Roque Lias evaluation,  she was  still in atrial fibrillation, rate controlled.   At that point, her blood pressure was 99/68, heart rate 80 to 90, oxygen  saturation 100%. She maintained atrial fibrillation at the time of her  evaluation. At that point, she planned for admission to telemetry, check  serial enzymes, rule out MI. She was started on IV diltiazem once blood  pressure would allow. Potassium was 3.2 which would be repleted. We would  check laboratories and start IV heparin. Only if she had recurrent chest  pain, would we start nitroglycerin.   HOSPITAL COURSE:  On the morning of November 10, 2004, Donna Simmons was stable.  At that time, her heart rate was 65 and she is in sinus rhythm. When we  reviewed telemetry with monitor tags, they reported that she had been sinus  rhythm ever since she was admitted to the floor. She had apparently  converted to atrial fibrillation to sinus rhythm while in the ER. At that  point, cardiac enzymes were negative x3, TSH normal, chest x-ray normal.  Potassium had only come up to 3.3 at that time. It was planned to  further  replete that prior to her discharge home. At this point, she was seen by Dr.  Kem Boroughs. She noted that the patient had spontaneously converted to  sinus rhythm. Potassium was still low despite the fact that Dr. Kem Boroughs had given 80 mEq in the ER. At that point, we planned to give  further potassium and check a magnesium level. Once these were repleted, we  would plan for discharge home. She would see Dr. Elsie Lincoln back for soon  followup. We would consider an outpatient cardiac catheterization versus  Cardiolite scan as Dr. Elsie Lincoln felt necessary given her increased recent  atrial fibrillation and chest pain associated with that. As well, we plan to  start Toprol-XL 12.5 mg daily. In the past, she had only been taking Toprol  p.r.n. palpitations. We told her to take this regularly to try to prevent  recurrence.   At that point, she was deemed stable  for discharge by Dr. Kem Boroughs.   CONSULTATIONS:  None.   PROCEDURES:  None.   LABORATORY DATA:  In the ER on admission, sodium 138, potassium 3.2, BUN 17,  creatinine 1.1. Cardiac markers negative. Hemoglobin 16.7, hematocrit 49. On  November 10, 2004, white count 5.7, hemoglobin 10.9, hematocrit 33.2, platelet  241. Sodium 139, potassium 3.3 this was further repleted prior to discharge  home, BUN 13, creatinine 0.9. Liver function tests normal. Cardiac enzymes  were completely normal x3. TSH normal. Chest x-ray:  No acute disease.   EKG in the emergency room had originally shown atrial fibrillation, 130  beats a minute. After Lopressor, it went to atrial fibrillation at about 80  beats a minute. Subsequent telemetry once the patient was on the floor all  showed normal sinus rhythm in the 60s.   Again, chest x-ray showed no acute disease.   DISCHARGE MEDICATIONS:  1.  Toprol-XL 12.5 mg once a day.  2.  Lisinopril/HCT 10/12.5 a day.  3.  Aspirin 81 mg a day.   FOLLOWUP:  On Monday, call [phone] to make an appointment to see Dr. Elsie Lincoln  in 1 to 2 weeks.   Note that at that office visit, we might need to recheck potassium level and  also will need to decide if she may need Cardiolite and/or catheterization,  and may also need to readdress her Coumadin issue.      MBE/MEDQ  D:  11/13/2004  T:  11/13/2004  Job:  [id number]

## 2011-01-17 NOTE — Op Note (Signed)
NAME:  Donna Simmons, Donna Simmons               ACCOUNT NO.:  [id number]   MEDICAL RECORD NO.:  [id number]          PATIENT TYPE:  INP   LOCATION:  5506                         FACILITY:  [facility]   PHYSICIAN:  Anselmo Rod, M.D.  DATE OF BIRTH:  [birth year]   DATE OF PROCEDURE:  11/23/2004  DATE OF DISCHARGE:  11/24/2004                                 OPERATIVE REPORT   PROCEDURE PERFORMED:  Esophagogastroduodenoscopy with biopsies times three  (cold biopsies).   ENDOSCOPIST:  Charna Elizabeth, M.D.   INSTRUMENT USED:  Olympus video panendoscope.   INDICATIONS FOR PROCEDURE:  The patient is a 63African-American female with  a history of drop in hemoglobin from 12 g/dl to 7.4 g/dl, status post two  units of packed red blood cells.  Repeat hemoglobin pending.  Rule out  peptic ulcer disease, esophagitis, gastritis, gastric masses, polyps etc.   PREPROCEDURE PREPARATION:  Informed consent was procured from the patient.  The patient was fasted for eight hours prior to the procedure and was  monitored on a telemetry bed.  Admission risks and benefits of the procedure  including perforation, bleeding, etc. were discussed with her in great  detail.   PREPROCEDURE PHYSICAL:  The patient had stable vital signs.  Neck supple,  chest clear to auscultation.  S1, S2 regular.  Abdomen soft with normal  bowel sounds.   DESCRIPTION OF PROCEDURE:  The patient was placed in the left lateral  decubitus position and sedated with 75 mg of Demerol and 7 mg of Versed in  slow incremental doses.  Once the patient was adequately sedated and  maintained on low-flow oxygen and continuous cardiac monitoring, the Olympus  video panendoscope was advanced through the mouth piece over the tongue into  the esophagus under direct vision.  The entire esophagus appeared normal  with no evidence of ring, stricture, masses, esophagitis or Barrett's  mucosa.  The scope was then advanced to the stomach.  No abnormalities were  noted on high retroflexion.  There were multiple antral ulcers seen which  were shallow without any evidence of a visible vessel.  The proximal small  bowel appeared normal.  Antral biopsies were done to rule out presence of  Helicobacter pylori by pathology.   IMPRESSION:  1.  Normal-appearing esophagus and proximal small bowel.  2.  Multiple antral ulcers seen without visible vessel.  Biopsies done for      Helicobacter pylori.   RECOMMENDATIONS:  1.  Change IV Protonix to p.o. twice daily.  2.  Low residue diet per now .  3.  Serial CBC's, colonoscopy emergently if hemoglobin continues to drop or      electively, if patient remains stable through her hospitalization.  4.  Ferrous sulfate supplementation to be started by tomorrow.      JNM/MEDQ  D:  11/23/2004  T:  11/24/2004  Job:  [id number]   cc:   Candyce Churn. Allyne Gee, M.D.  [address]  [address]  [place]  [place] [address]  Fax: [phone]   Madaline Savage, M.D.  [phone]  [place]  [place] [address]  Fax: [phone]

## 2011-01-17 NOTE — Op Note (Signed)
NAME:  Donna Simmons, Donna Simmons               ACCOUNT NO.:  [id number]   MEDICAL RECORD NO.:  [id number]          PATIENT TYPE:  AMB   LOCATION:  ENDO                         FACILITY:  [facility]   PHYSICIAN:  Anselmo Rod, M.D.  DATE OF BIRTH:  05/04/1941   DATE OF PROCEDURE:  02/14/2005  DATE OF DISCHARGE:  02/14/2005                                 OPERATIVE REPORT   PROCEDURE PERFORMED:  Colonoscopy with snare polypectomy times three.   ENDOSCOPIST:  Charna Elizabeth, M.D.   INSTRUMENT USED:  Olympus video colonoscope.   INDICATION FOR PROCEDURE:  This 70 year-old African-American female  underwent screening colonoscopy.  Patient has a history of iron deficiency  anemia and is presently on iron supplement.   PREPROCEDURE PREPARATION:  Informed consent was procured from the patient.  The patient fasted for 8 hours prior to the procedure and prepped with a  bottle of magnesium citrate and a gallon of GoLYTELY the night prior to the  procedure.  Risks and benefits of the procedure including a 10% miss rate of  cancer and polyps was discussed with the patient as well.   PREPROCEDURE PHYSICAL:  The patient had stable vital signs.  NECK:  Supple.  CHEST:  Clear to auscultation, S1, S2 regular.  ABDOMEN:  Soft with normal bowel sounds.   DESCRIPTION OF THE PROCEDURE:  The patient was placed in the left lateral  decubitus position, sedated with 60 mg of Demerol and 8 mg of Versed in slow  incremental doses.  Once the patient was adequately sedated and maintained  on low flow oxygen and continuous cardiac monitoring, the Olympus video  colonoscope was advanced from the rectum to the cecum.  The patient had a  lipomatous ileocecal valve.  Two polyps were removed, these were small and  sessile in nature.  These were removed from the hepatic flexure by snare  polypectomy.  Another polyp was snared from 30 cm by snare polypectomy (hot  snare).  Small internal hemorrhoids were seen on retroflexion.  The  rest of  the exam was unremarkable.  The patient tolerated the procedure well without  complications.   IMPRESSION:  1.  Small nonbleeding internal hemorrhoids.  2.  Small sessile polyps snared from 30 cm (hot snare).  3.  Two small sessile polyps snared from hepatic flexure (hot snare).  4.  Lipomatous ileocecal valve.   RECOMMENDATION:  1.  Await pathology results.  2.  Avoid all nonsteroidals including aspirin for the next 2-3 weeks.  3.  Repeat colonoscopy depending on pathology results.  4.  Outpatient followup as need arises in the future.       JNM/MEDQ  D:  02/14/2005  T:  02/16/2005  Job:  [id number]   cc:   Candyce Churn. Allyne Gee, M.D.  [address]  [address]  [place]  [place] [address]  Fax: [phone]   Madaline Savage, M.D.  [phone] [address], [address]  [place]  [place] [address]  Fax: [phone]

## 2011-05-28 LAB — URINALYSIS, ROUTINE W REFLEX MICROSCOPIC
Nitrite: NEGATIVE
Protein, ur: NEGATIVE
Specific Gravity, Urine: 1.009
Urobilinogen, UA: 0.2

## 2011-05-28 LAB — POCT I-STAT, CHEM 8
Calcium, Ion: 1.12
Creatinine, Ser: 1.2
Glucose, Bld: 95
Hemoglobin: 14.3
Potassium: 3.5

## 2011-05-28 LAB — POCT CARDIAC MARKERS
CKMB, poc: <1 — ABNORMAL LOW
Troponin i, poc: <0.05

## 2011-05-28 LAB — URINE MICROSCOPIC-ADD ON

## 2011-08-12 ENCOUNTER — Other Ambulatory Visit: Payer: Self-pay | Admitting: Family Medicine

## 2011-08-12 DIAGNOSIS — Z1231 Encounter for screening mammogram for malignant neoplasm of breast: Secondary | ICD-10-CM

## 2011-08-21 ENCOUNTER — Ambulatory Visit (HOSPITAL_COMMUNITY): Payer: Medicare Other

## 2011-09-23 ENCOUNTER — Ambulatory Visit (HOSPITAL_COMMUNITY)
Admission: RE | Admit: 2011-09-23 | Discharge: 2011-09-23 | Disposition: A | Discharge status: Home / Self Care | Payer: Medicare Other | Source: Ambulatory Visit | Attending: Family Medicine | Admitting: Family Medicine

## 2011-09-23 DIAGNOSIS — Z1231 Encounter for screening mammogram for malignant neoplasm of breast: Secondary | ICD-10-CM

## 2011-11-01 ENCOUNTER — Other Ambulatory Visit: Payer: Self-pay | Admitting: Family Medicine

## 2012-02-21 ENCOUNTER — Other Ambulatory Visit: Payer: Self-pay

## 2012-02-21 ENCOUNTER — Emergency Department (HOSPITAL_COMMUNITY)
Admission: EM | Admit: 2012-02-21 | Discharge: 2012-02-21 | Disposition: A | Discharge status: Home / Self Care | Payer: Medicare Other | Attending: Emergency Medicine | Admitting: Emergency Medicine

## 2012-02-21 ENCOUNTER — Encounter (HOSPITAL_COMMUNITY): Payer: Self-pay | Admitting: *Deleted

## 2012-02-21 DIAGNOSIS — K219 Gastro-esophageal reflux disease without esophagitis: Secondary | ICD-10-CM | POA: Insufficient documentation

## 2012-02-21 DIAGNOSIS — R002 Palpitations: Secondary | ICD-10-CM | POA: Insufficient documentation

## 2012-02-21 DIAGNOSIS — E7800 Pure hypercholesterolemia, unspecified: Secondary | ICD-10-CM | POA: Insufficient documentation

## 2012-02-21 DIAGNOSIS — Z7901 Long term (current) use of anticoagulants: Secondary | ICD-10-CM | POA: Insufficient documentation

## 2012-02-21 DIAGNOSIS — I10 Essential (primary) hypertension: Secondary | ICD-10-CM | POA: Insufficient documentation

## 2012-02-21 HISTORY — DX: Essential (primary) hypertension: I10

## 2012-02-21 HISTORY — DX: Pure hypercholesterolemia, unspecified: E78.00

## 2012-02-21 HISTORY — DX: Gastro-esophageal reflux disease without esophagitis: K21.9

## 2012-02-21 NOTE — ED Provider Notes (Signed)
History     CSN: [id number]  Arrival date & time 02/21/12  0544   First MD Initiated Contact with Patient 02/21/12 [phone]      Chief Complaint  Patient presents with  . Palpitations    (Consider location/radiation/quality/duration/timing/severity/associated sxs/prior treatment) HPI Comments: History of atrial fibrillation. She has paroxysmal episodes which typically resolve quickly with flecainide or gallstone maneuver. She developed an episode approximately 45 minutes prior to arrival which persisted so she sought further evaluation. On arrival to the emergency department she was in normal sinus rhythm and her symptoms had resolved. She denies chest pain, shortness of breath. She states she took a dose of flecainide immediately upon feeling the palpitations.  Patient is a 71 y.o. female presenting with palpitations. The history is provided by the patient. No language interpreter was used.  Palpitations  This is a new problem. The current episode started 1 to 2 hours ago. The problem occurs constantly. The problem has been resolved. The problem is associated with an unknown factor. On average, each episode lasts 45 minutes. Associated symptoms include irregular heartbeat. Pertinent negatives include no diaphoresis, no fever, no malaise/fatigue, no numbness, no chest pain, no exertional chest pressure, no near-syncope, no orthopnea, no PND, no abdominal pain, no nausea, no vomiting, no headaches, no back pain, no leg pain, no dizziness, no weakness, no cough and no shortness of breath. Treatments tried: flecainide. The treatment provided significant relief.    Past Medical History  Diagnosis Date  . Atrial fibrillation   . Hypertension   . Hypercholesteremia   . GERD (gastroesophageal reflux disease)     History reviewed. No pertinent past surgical history.  History reviewed. No pertinent family history.  History  Substance Use Topics  . Smoking status: Not on file  . Smokeless  tobacco: Not on file  . Alcohol Use:     OB History    Grav Para Term Preterm Abortions TAB SAB Ect Mult Living                  Review of Systems  Constitutional: Negative for fever, chills, malaise/fatigue, diaphoresis, activity change, appetite change and fatigue.  HENT: Negative for congestion, sore throat, rhinorrhea, neck pain and neck stiffness.   Respiratory: Negative for cough and shortness of breath.   Cardiovascular: Positive for palpitations (have since resolved). Negative for chest pain, orthopnea, PND and near-syncope.  Gastrointestinal: Negative for nausea, vomiting and abdominal pain.  Genitourinary: Negative for dysuria, urgency, frequency and flank pain.  Musculoskeletal: Negative for myalgias, back pain and arthralgias.  Neurological: Negative for dizziness, weakness, light-headedness, numbness and headaches.  All other systems reviewed and are negative.    Allergies  Review of patient's allergies indicates no known allergies.  Home Medications   Current Outpatient Rx  Name Route Sig Dispense Refill  . ATORVASTATIN CALCIUM 10 MG PO TABS Oral Take 10 mg by mouth daily.    Marland Kitchen FLECAINIDE ACETATE 50 MG PO TABS Oral Take 50 mg by mouth 2 (two) times daily.    Marland Kitchen LISINOPRIL-HYDROCHLOROTHIAZIDE 10-12.5 MG PO TABS Oral Take 1 tablet by mouth daily.    Marland Kitchen METOPROLOL TARTRATE 25 MG PO TABS Oral Take 25 mg by mouth daily.    Marland Kitchen OMEPRAZOLE 20 MG PO CPDR Oral Take 20 mg by mouth daily.    . WARFARIN SODIUM 5 MG PO TABS Oral Take 2.5-5 mg by mouth daily. Take 1 tablet every day except take 1/2 tablet on Monday and Friday  BP 150/77  Temp 97.8 F (36.6 C) (Oral)  Resp 15  SpO2 100%  Physical Exam  Nursing note and vitals reviewed. Constitutional: She is oriented to person, place, and time. She appears well-developed and well-nourished. No distress.  HENT:  Head: Normocephalic and atraumatic.  Mouth/Throat: Oropharynx is clear and moist.  Eyes: Conjunctivae and  EOM are normal. Pupils are equal, round, and reactive to light.  Neck: Normal range of motion. Neck supple.  Cardiovascular: Normal rate, regular rhythm, normal heart sounds and intact distal pulses.  Exam reveals no gallop and no friction rub.   No murmur heard. Pulmonary/Chest: Effort normal and breath sounds normal. No respiratory distress. She exhibits no tenderness.  Abdominal: Soft. Bowel sounds are normal. There is no tenderness. There is no rebound and no guarding.  Musculoskeletal: Normal range of motion. She exhibits no edema and no tenderness.  Neurological: She is alert and oriented to person, place, and time. No cranial nerve deficit.  Skin: Skin is warm and dry. No rash noted.    ED Course  Procedures (including critical care time)   Date: 02/21/2012  Rate: 86  Rhythm: normal sinus rhythm  QRS Axis: normal  Intervals: normal  ST/T Wave abnormalities: normal  Conduction Disutrbances:none  Narrative Interpretation:   Old EKG Reviewed: unchanged  Labs Reviewed - No data to display No results found.   1. Palpitations       MDM  There is no indication for testing at this time. The patient developed palpitations this evening. She took a dose of flecainide as instructed by her cardiologist. On arrival to the emergency department her symptoms had resolved and the patient was in normal sinus rhythm. There is no indication for additional testing at this time. Patient is asymptomatic and is safe for discharge to home. Provided strict return precautions. Instructed to followup with her cardiologist next week        Dayton Bailiff, MD 02/21/12 [phone]

## 2012-02-21 NOTE — ED Notes (Signed)
Per EMS: pt began feeling palpitation approx 45 min ago.  States hx of afib, usually only lasts 5 minutes, tonight is lasting longer.  Denies SOB, CP.

## 2012-02-21 NOTE — ED Notes (Signed)
No rx given, pt voiced understanding to f/u with cardiologist and return for worsening sx

## 2012-02-21 NOTE — ED Notes (Signed)
Pt c/o episode of palpitations this evening that lasted from approx 1 hour.  When episode occurred, pt took extra dose of flecainide.  Palpitations resolved without intervention from EMS/ ED persons.  Denies pain, SOB, n/v at this time.

## 2012-02-21 NOTE — Discharge Instructions (Signed)
Palpitations  A palpitation is the feeling that your heartbeat is irregular or is faster than normal. Although this is frightening, it usually is not serious. Palpitations may be caused by excesses of smoking, caffeine, or alcohol. They are also brought on by stress and anxiety. Sometimes, they are caused by heart disease. Unless otherwise noted, your caregiver did not find any signs of serious illness at this time. HOME CARE INSTRUCTIONS  To help prevent palpitations:  Drink decaffeinated coffee, tea, and soda pop. Avoid chocolate.   If you smoke or drink alcohol, quit or cut down as much as possible.   Reduce your stress or anxiety level. Biofeedback, yoga, or meditation will help you relax. Physical activity such as swimming, jogging, or walking also may be helpful.  SEEK MEDICAL CARE IF:   You continue to have a fast heartbeat.   Your palpitations occur more often.  SEEK IMMEDIATE MEDICAL CARE IF: You develop chest pain, shortness of breath, severe headache, dizziness, or fainting. Document Released: 08/15/2000 Document Revised: 08/07/2011 Document Reviewed: 10/15/2007 [facility] Patient Information 2012 [place], [place].  Take the flecainide as prescribed by your cardiologist when you develop palpitations

## 2012-03-29 ENCOUNTER — Ambulatory Visit: Payer: Self-pay | Admitting: Internal Medicine

## 2012-04-20 ENCOUNTER — Encounter: Payer: Self-pay | Admitting: Physician Assistant

## 2012-04-20 DIAGNOSIS — I10 Essential (primary) hypertension: Secondary | ICD-10-CM | POA: Insufficient documentation

## 2012-04-20 DIAGNOSIS — I480 Paroxysmal atrial fibrillation: Secondary | ICD-10-CM

## 2012-04-20 DIAGNOSIS — T148XXA Other injury of unspecified body region, initial encounter: Secondary | ICD-10-CM | POA: Insufficient documentation

## 2012-04-29 ENCOUNTER — Ambulatory Visit (INDEPENDENT_AMBULATORY_CARE_PROVIDER_SITE_OTHER): Payer: Medicare Other | Admitting: Family Medicine

## 2012-04-29 VITALS — BP 126/60 | HR 70 | Temp 97.8°F | Resp 16 | Ht 63.5 in | Wt 145.0 lb

## 2012-04-29 DIAGNOSIS — I10 Essential (primary) hypertension: Secondary | ICD-10-CM

## 2012-04-29 NOTE — Progress Notes (Signed)
 [facility]:  Office Visit  Chief Complaint:  Chief Complaint  Patient presents with  . Hypertension    patient states BP was 160/100 at home was fine at coumadin clinic this am    HPI: Donna Simmons is a 71 y.o. female who complains of  Here to make sure  her BP is ok. She uses BP cuff and at home her BP this AM was 150s/70s which is rare for her. In our office it is 120/60s which is normal for her.  Past Medical History  Diagnosis Date  . Atrial fibrillation   . Hypertension   . Hypercholesteremia   . GERD (gastroesophageal reflux disease)    No past surgical history on file. History   Social History  . Marital Status: Married    Spouse Name: N/A    Number of Children: N/A  . Years of Education: N/A   Social History Main Topics  . Smoking status: Never Smoker   . Smokeless tobacco: Not on file  . Alcohol Use: Not on file  . Drug Use: Not on file  . Sexually Active: Not on file   Other Topics Concern  . Not on file   Social History Narrative  . No narrative on file   No family history on file. No Known Allergies Prior to Admission medications   Medication Sig Start Date End Date Taking? Authorizing Provider  flecainide (TAMBOCOR) 50 MG tablet Take 50 mg by mouth 2 (two) times daily.   Yes Historical Provider, MD  lisinopril-hydrochlorothiazide (PRINZIDE,ZESTORETIC) 10-12.5 MG per tablet Take 1 tablet by mouth daily.   Yes Historical Provider, MD  metoprolol tartrate (LOPRESSOR) 25 MG tablet Take 25 mg by mouth daily.   Yes Historical Provider, MD  omeprazole (PRILOSEC) 20 MG capsule Take 20 mg by mouth daily.   Yes Historical Provider, MD  warfarin (COUMADIN) 5 MG tablet Take 2.5-5 mg by mouth daily. Take 1 tablet every day except take 1/2 tablet on Monday and Friday   Yes Historical Provider, MD  atorvastatin (LIPITOR) 10 MG tablet Take 10 mg by mouth daily.    Historical Provider, MD     ROS: The patient denies fevers, chills, night  sweats, unintentional weight loss, chest pain, palpitations, wheezing, dyspnea on exertion, nausea, vomiting, abdominal pain, dysuria, hematuria, melena, numbness, weakness, or tingling.   All other systems have been reviewed and were otherwise negative with the exception of those mentioned in the HPI and as above.    PHYSICAL EXAM: Filed Vitals:   04/29/12 1226  BP: 126/60  Pulse: 70  Temp: 97.8 F (36.6 C)  Resp: 16   Filed Vitals:   04/29/12 1226  Height: 5' 3.5" (1.613 m)  Weight: 145 lb (65.772 kg)   Body mass index is 25.28 kg/(m^2).  General: Alert, no acute distress HEENT:  Normocephalic, atraumatic, oropharynx patent.  Cardiovascular:  Irreg irregular no rubs murmurs or gallops.  No Carotid bruits, radial pulse intact. No pedal edema.  Respiratory: Clear to auscultation bilaterally.  No wheezes, rales, or rhonchi.  No cyanosis, no use of accessory musculature GI: No organomegaly, abdomen is soft and non-tender, positive bowel sounds.  No masses. Skin: No rashes. Neurologic: Facial musculature symmetric. Psychiatric: Patient is appropriate throughout our interaction. Lymphatic: No cervical lymphadenopathy Musculoskeletal: Gait intact.   LABS: Results for orders placed during the hospital encounter of 12/17/10  DIFFERENTIAL      Component Value Range   Neutrophils Relative 58  43 - 77 %  Neutro Abs 2.9  1.7 - 7.7 K/uL   Lymphocytes Relative 34  12 - 46 %   Lymphs Abs 1.7  0.7 - 4.0 K/uL   Monocytes Relative 6  3 - 12 %   Monocytes Absolute 0.3  0.1 - 1.0 K/uL   Eosinophils Relative 1  0 - 5 %   Eosinophils Absolute 0.0  0.0 - 0.7 K/uL   Basophils Relative 0  0 - 1 %   Basophils Absolute 0.0  0.0 - 0.1 K/uL  CBC      Component Value Range   WBC 5.0  4.0 - 10.5 K/uL   RBC 4.71  3.87 - 5.11 MIL/uL   Hemoglobin 12.0  12.0 - 15.0 g/dL   HCT 21.3  08.6 - 57.8 %   MCV 77.5 (*) 78.0 - 100.0 fL   MCH 25.5 (*) 26.0 - 34.0 pg   MCHC 32.9  30.0 - 36.0 g/dL   RDW  46.9  62.9 - 52.8 %   Platelets 208  150 - 400 K/uL  PROTIME-INR      Component Value Range   Prothrombin Time 13.3  11.6 - 15.2 seconds   INR 0.99  0.00 - 1.49  APTT      Component Value Range   aPTT 33  24 - 37 seconds  COMPREHENSIVE METABOLIC PANEL      Component Value Range   Sodium 141  135 - 145 mEq/L   Potassium 3.7  3.5 - 5.1 mEq/L   Chloride 112  96 - 112 mEq/L   CO2 24  19 - 32 mEq/L   Glucose, Bld 101 (*) 70 - 99 mg/dL   BUN 4 (*) 6 - 23 mg/dL   Creatinine, Ser 4.13  0.4 - 1.2 mg/dL   Calcium 8.7  8.4 - 24.4 mg/dL   Total Protein 6.4  6.0 - 8.3 g/dL   Albumin 3.4 (*) 3.5 - 5.2 g/dL   AST 18  0 - 37 U/L   ALT 14  0 - 35 U/L   Alkaline Phosphatase 49  39 - 117 U/L   Total Bilirubin 0.6  0.3 - 1.2 mg/dL   GFR calc non Af Amer 51 (*) >60 mL/min   GFR calc Af Amer    >60 mL/min   Value: >60            The eGFR has been calculated     using the MDRD equation.     This calculation has not been     validated in all clinical     situations.     eGFR's persistently     <60 mL/min signify     possible Chronic Kidney Disease.  POCT CARDIAC MARKERS      Component Value Range   Myoglobin, poc 63.6  12 - 200 ng/mL   CKMB, poc <1.0 (*) 1.0 - 8.0 ng/mL   Troponin i, poc <0.05  0.00 - 0.09 ng/mL   Comment       Value:            TROPONIN VALUES IN THE RANGE     OF 0.00-0.09 ng/mL SHOW     NO INDICATION OF     MYOCARDIAL INJURY.                PERSISTENTLY INCREASED TROPONIN     VALUES IN THE RANGE OF 0.10-0.24     ng/mL CAN BE SEEN IN:           -  UNSTAB ANGINA           -CONGESTIVE HEART FAILURE           -MYOCARDITIS           -CHEST TRAUMA           -ARRYHTHMIAS           -LATE PRESENTING MI           -COPD       CLINICAL FOLLOW-UP RECOMMENDED.                TROPONIN VALUES >=0.25 ng/mL     INDICATE POSSIB MYOCARDIAL     ISCHEMIA. SERIAL TESTING     RECOMMENDED.  MAGNESIUM      Component Value Range   Magnesium 2.5  1.5 - 2.5 mg/dL  PROTIME-INR       Component Value Range   Prothrombin Time 14.7  11.6 - 15.2 seconds   INR 1.13  0.00 - 1.49     EKG/XRAY:   Primary read interpreted by Dr. Conley Rolls at [facility].   ASSESSMENT/PLAN: Encounter Diagnosis  Name Primary?  . HTN (hypertension) Yes   BP well controlled in office Continue to monitor if > 140/90 or have Ha, vision changes, nausea, vomiting then need to return for evaluation    ,  PHUONG, DO 04/30/2012 12:11 PM

## 2012-05-10 ENCOUNTER — Ambulatory Visit: Payer: Self-pay | Admitting: Internal Medicine

## 2012-05-18 ENCOUNTER — Ambulatory Visit (INDEPENDENT_AMBULATORY_CARE_PROVIDER_SITE_OTHER): Payer: Medicare Other | Admitting: Family Medicine

## 2012-05-18 ENCOUNTER — Encounter: Payer: Self-pay | Admitting: Family Medicine

## 2012-05-18 ENCOUNTER — Ambulatory Visit: Payer: Medicare Other

## 2012-05-18 VITALS — BP 154/84 | HR 62 | Temp 98.6°F | Resp 16 | Ht 63.0 in | Wt 143.8 lb

## 2012-05-18 DIAGNOSIS — I10 Essential (primary) hypertension: Secondary | ICD-10-CM

## 2012-05-18 DIAGNOSIS — M5030 Other cervical disc degeneration, unspecified cervical region: Secondary | ICD-10-CM

## 2012-05-18 DIAGNOSIS — R209 Unspecified disturbances of skin sensation: Secondary | ICD-10-CM

## 2012-05-18 DIAGNOSIS — R202 Paresthesia of skin: Secondary | ICD-10-CM

## 2012-05-18 MED ORDER — FLUTICASONE PROPIONATE 50 MCG/ACT NA SUSP: 2.0000 | Freq: Every day | NASAL | Status: DC

## 2012-05-18 NOTE — Progress Notes (Signed)
S: This 71 y.o. AA female has well controlled HTN and is asymptomatic. She denies any side effects with medications and gets some labs at Coumadin clinic at [facility]   She has C-spine DDD with crepitus. Has had treatment at Chiropractor which included an adjustment; pt felt fine afterwards. She had a hyperextension of neck ~ 3 weeks ago. She now has intermittent stabbing pains in finger tips and toes. 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 Upon further questioning, she admits tingling primarily in middle and 4th fingers.  ROS: Negative for abnl weight change, diaphoresis, fever, CP or tightness, cough or SOB, palpitations, numbness or weakness in ext, tremor, abnl coordination, HA or syncope.   O: Filed Vitals:   05/18/12 1401  BP: 154/84  Pulse: 62  Temp: 98.6 F (37 C)  Resp: 16   GEN: In NAD: WN,WD. HENT: [place]/AT; EOMI, conj/scl clear. MS: Tender C-spine lateral to midline bilaterally; ROM normal but pt has subjective pain with rotation. NEURO: A&O x 3; CNs intact; Motor: strength- 4+/5 in upper ext. DTRs: 2+/= in upper and lower ext. Gait is normal.  [facility] reading (PRIMARY) by  Dr. Audria Nine: C-spine films- mild deg changes at C5-C6 with spondylolisthesis  A?P:  1. Paresthesias - suspect mild CTS in hands due to hours of computer work. DG Cervical Spine 2-3 Views  2. DDD (degenerative disc disease), cervical  Have discussed possible further work-up is symptoms progress  3. HTN (hypertension) - stable Continue current medications and follow-up at [facility]

## 2012-05-18 NOTE — Patient Instructions (Signed)
Carpal Tunnel Syndrome You may have carpal tunnel syndrome. This is a common condition. Carpal tunnel syndrome occurs when the tendons, bones, or ligaments in the wrist press against the median nerve as it passes into the hand.  Symptoms can include:  Intermittent numbness.   Pain or a tingling sensation in thumb and first two fingers.  The pain may radiate up to the shoulder. There may even be weakness in the hand muscles. The pain is often worse at night and in the early morning. Nerve conduction tests may be used to prove the diagnosis. Carpal tunnel syndrome is most often due to repeated movements of the hand or wrist. Other causes can include:  Prior injuries.   Diabetes.   Obesity.   Smoking.   Pregnancy. Symptoms that develop during pregnancy often stop when the pregnancy is over.  Treatment includes:  Splinting - A wrist splint helps prevent movements that irritate the nerve. Splints are especially helpful at night when the symptoms are often worse.   Ice packs - Cold packs applied to the palm side of the wrist for 20 minutes every 2 hours while awake may give some relief.   Medication - Medicine to reduce inflammation and pain are often used. Cortisone injections around the nerve may also bring improvement.  Severe cases of carpal tunnel syndrome can require surgery to relieve the pressure on the nerve. This may be necessary if there is evidence of weakness or decreased sensation in your hand, or if your symptoms do not improve with conservative treatment. See your caregiver for follow-up to be certain your condition is improving. Document Released: 09/25/2004 Document Revised: 04/30/2011 Document Reviewed: 06/24/2007 Exit[employer] Patient Information 2012 [employer]. 

## 2012-05-19 ENCOUNTER — Telehealth: Payer: Self-pay | Admitting: Family Medicine

## 2012-05-19 DIAGNOSIS — M5030 Other cervical disc degeneration, unspecified cervical region: Secondary | ICD-10-CM | POA: Insufficient documentation

## 2012-05-19 NOTE — Telephone Encounter (Signed)
Pt had paresthesias involving fingertips and tips of toes. She has DDD of C-spine and had xray at most recent visit. She was contacted to discuss final radiology impression of xray (not different from my review in the office at time of visit).

## 2012-07-20 ENCOUNTER — Other Ambulatory Visit: Payer: Self-pay | Admitting: *Deleted

## 2012-07-20 MED ORDER — LISINOPRIL-HYDROCHLOROTHIAZIDE 10-12.5 MG PO TABS: 1.0000 | ORAL_TABLET | Freq: Every day | ORAL | Status: DC

## 2012-09-17 ENCOUNTER — Other Ambulatory Visit: Payer: Self-pay | Admitting: Physician Assistant

## 2012-09-23 ENCOUNTER — Encounter: Payer: Self-pay | Admitting: Family Medicine

## 2012-09-23 ENCOUNTER — Ambulatory Visit (INDEPENDENT_AMBULATORY_CARE_PROVIDER_SITE_OTHER): Payer: Medicare PPO | Admitting: Family Medicine

## 2012-09-23 VITALS — BP 136/75 | HR 65 | Temp 97.7°F | Resp 16 | Ht 63.5 in | Wt 145.0 lb

## 2012-09-23 DIAGNOSIS — I10 Essential (primary) hypertension: Secondary | ICD-10-CM

## 2012-09-23 DIAGNOSIS — Z760 Encounter for issue of repeat prescription: Secondary | ICD-10-CM

## 2012-09-23 DIAGNOSIS — E785 Hyperlipidemia, unspecified: Secondary | ICD-10-CM

## 2012-09-23 DIAGNOSIS — E7800 Pure hypercholesterolemia, unspecified: Secondary | ICD-10-CM

## 2012-09-23 LAB — LIPID PANEL
HDL: 74 mg/dL (ref >39)
LDL Cholesterol: 178 mg/dL — ABNORMAL HIGH (ref 0–99)
Total CHOL/HDL Ratio: 3.7 Ratio
Triglycerides: 103 mg/dL (ref <150)

## 2012-09-23 LAB — COMPREHENSIVE METABOLIC PANEL
CO2: 28 mEq/L (ref 19–32)
Creat: 0.86 mg/dL (ref 0.50–1.10)
Glucose, Bld: 83 mg/dL (ref 70–99)
Total Bilirubin: 0.6 mg/dL (ref 0.3–1.2)

## 2012-09-23 MED ORDER — OMEPRAZOLE 20 MG PO CPDR: 20.0000 mg | DELAYED_RELEASE_CAPSULE | Freq: Every day | ORAL | Status: DC

## 2012-09-23 MED ORDER — LISINOPRIL-HYDROCHLOROTHIAZIDE 10-12.5 MG PO TABS: 1.0000 | ORAL_TABLET | Freq: Every day | ORAL | Status: DC

## 2012-09-23 MED ORDER — METOPROLOL TARTRATE 25 MG PO TABS: 25.0000 mg | ORAL_TABLET | Freq: Every day | ORAL | Status: DC

## 2012-09-23 NOTE — Patient Instructions (Signed)
Hypertriglyceridemia  Diet for High blood levels of Triglycerides Most fats in food are triglycerides. Triglycerides in your blood are stored as fat in your body. High levels of triglycerides in your blood may put you at a greater risk for heart disease and stroke.  Normal triglyceride levels are less than 150 mg/dL. Borderline high levels are 150-199 mg/dl. High levels are 200 - 499 mg/dL, and very high triglyceride levels are greater than 500 mg/dL. The decision to treat high triglycerides is generally based on the level. For people with borderline or high triglyceride levels, treatment includes weight loss and exercise. Drugs are recommended for people with very high triglyceride levels. Many people who need treatment for high triglyceride levels have metabolic syndrome. This syndrome is a collection of disorders that often include: insulin resistance, high blood pressure, blood clotting problems, high cholesterol and triglycerides. TESTING PROCEDURE FOR TRIGLYCERIDES  You should not eat 4 hours before getting your triglycerides measured. The normal range of triglycerides is between 10 and 250 milligrams per deciliter (mg/dl). Some people may have extreme levels (1000 or above), but your triglyceride level may be too high if it is above 150 mg/dl, depending on what other risk factors you have for heart disease.  People with high blood triglycerides may also have high blood cholesterol levels. If you have high blood cholesterol as well as high blood triglycerides, your risk for heart disease is probably greater than if you only had high triglycerides. High blood cholesterol is one of the main risk factors for heart disease. CHANGING YOUR DIET  Your weight can affect your blood triglyceride level. If you are more than 20% above your ideal body weight, you may be able to lower your blood triglycerides by losing weight. Eating less and exercising regularly is the best way to combat this. Fat provides more  calories than any other food. The best way to lose weight is to eat less fat. Only 30% of your total calories should come from fat. Less than 7% of your diet should come from saturated fat. A diet low in fat and saturated fat is the same as a diet to decrease blood cholesterol. By eating a diet lower in fat, you may lose weight, lower your blood cholesterol, and lower your blood triglyceride level.  Eating a diet low in fat, especially saturated fat, may also help you lower your blood triglyceride level. Ask your dietitian to help you figure how much fat you can eat based on the number of calories your caregiver has prescribed for you.  Exercise, in addition to helping with weight loss may also help lower triglyceride levels.   Alcohol can increase blood triglycerides. You may need to stop drinking alcoholic beverages.  Too much carbohydrate in your diet may also increase your blood triglycerides. Some complex carbohydrates are necessary in your diet. These may include bread, rice, potatoes, other starchy vegetables and cereals.  Reduce "simple" carbohydrates. These may include pure sugars, candy, honey, and jelly without losing other nutrients. If you have the kind of high blood triglycerides that is affected by the amount of carbohydrates in your diet, you will need to eat less sugar and less high-sugar foods. Your caregiver can help you with this.  Adding 2-4 grams of fish oil (EPA+ DHA) may also help lower triglycerides. Speak with your caregiver before adding any supplements to your regimen. Following the Diet  Maintain your ideal weight. Your caregivers can help you with a diet. Generally, eating less food and getting more   exercise will help you lose weight. Joining a weight control group may also help. Ask your caregivers for a good weight control group in your area.  Eat low-fat foods instead of high-fat foods. This can help you lose weight too.  These foods are lower in fat. Eat MORE of these:    Dried beans, peas, and lentils.  Egg whites.  Low-fat cottage cheese.  Fish.  Lean cuts of meat, such as round, sirloin, rump, and flank (cut extra fat off meat you fix).  Whole grain breads, cereals and pasta.  Skim and nonfat dry milk.  Low-fat yogurt.  Poultry without the skin.  Cheese made with skim or part-skim milk, such as mozzarella, parmesan, farmers', ricotta, or pot cheese. These are higher fat foods. Eat LESS of these:   Whole milk and foods made from whole milk, such as American, blue, cheddar, monterey jack, and swiss cheese  High-fat meats, such as luncheon meats, sausages, knockwurst, bratwurst, hot dogs, ribs, corned beef, ground pork, and regular ground beef.  Fried foods. Limit saturated fats in your diet. Substituting unsaturated fat for saturated fat may decrease your blood triglyceride level. You will need to read package labels to know which products contain saturated fats.  These foods are high in saturated fat. Eat LESS of these:   Fried pork skins.  Whole milk.  Skin and fat from poultry.  Palm oil.  Butter.  Shortening.  Cream cheese.  Bacon.  Margarines and baked goods made from listed oils.  Vegetable shortenings.  Chitterlings.  Fat from meats.  Coconut oil.  Palm kernel oil.  Lard.  Cream.  Sour cream.  Fatback.  Coffee whiteners and non-dairy creamers made with these oils.  Cheese made from whole milk. Use unsaturated fats (both polyunsaturated and monounsaturated) moderately. Remember, even though unsaturated fats are better than saturated fats; you still want a diet low in total fat.  These foods are high in unsaturated fat:   Canola oil.  Sunflower oil.  Mayonnaise.  Almonds.  Peanuts.  Pine nuts.  Margarines made with these oils.  Safflower oil.  Olive oil.  Avocados.  Cashews.  Peanut butter.  Sunflower seeds.  Soybean oil.  Peanut  oil.  Olives.  Pecans.  Walnuts.  Pumpkin seeds. Avoid sugar and other high-sugar foods. This will decrease carbohydrates without decreasing other nutrients. Sugar in your food goes rapidly to your blood. When there is excess sugar in your blood, your liver may use it to make more triglycerides. Sugar also contains calories without other important nutrients.  Eat LESS of these:   Sugar, brown sugar, powdered sugar, jam, jelly, preserves, honey, syrup, molasses, pies, candy, cakes, cookies, frosting, pastries, colas, soft drinks, punches, fruit drinks, and regular gelatin.  Avoid alcohol. Alcohol, even more than sugar, may increase blood triglycerides. In addition, alcohol is high in calories and low in nutrients. Ask for sparkling water, or a diet soft drink instead of an alcoholic beverage. Suggestions for planning and preparing meals   Bake, broil, grill or roast meats instead of frying.  Remove fat from meats and skin from poultry before cooking.  Add spices, herbs, lemon juice or vinegar to vegetables instead of salt, rich sauces or gravies.  Use a non-stick skillet without fat or use no-stick sprays.  Cool and refrigerate stews and broth. Then remove the hardened fat floating on the surface before serving.  Refrigerate meat drippings and skim off fat to make low-fat gravies.  Serve more fish.  Use less butter,   margarine and other high-fat spreads on bread or vegetables.  Use skim or reconstituted non-fat dry milk for cooking.  Cook with low-fat cheeses.  Substitute low-fat yogurt or cottage cheese for all or part of the sour cream in recipes for sauces, dips or congealed salads.  Use half yogurt/half mayonnaise in salad recipes.  Substitute evaporated skim milk for cream. Evaporated skim milk or reconstituted non-fat dry milk can be whipped and substituted for whipped cream in certain recipes.  Choose fresh fruits for dessert instead of high-fat foods such as pies or  cakes. Fruits are naturally low in fat. When Dining Out   Order low-fat appetizers such as fruit or vegetable juice, pasta with vegetables or tomato sauce.  Select clear, rather than cream soups.  Ask that dressings and gravies be served on the side. Then use less of them.  Order foods that are baked, broiled, poached, steamed, stir-fried, or roasted.  Ask for margarine instead of butter, and use only a small amount.  Drink sparkling water, unsweetened tea or coffee, or diet soft drinks instead of alcohol or other sweet beverages. QUESTIONS AND ANSWERS ABOUT OTHER FATS IN THE BLOOD: SATURATED FAT, TRANS FAT, AND CHOLESTEROL What is trans fat? Trans fat is a type of fat that is formed when vegetable oil is hardened through a process called hydrogenation. This process helps makes foods more solid, gives them shape, and prolongs their shelf life. Trans fats are also called hydrogenated or partially hydrogenated oils.  What do saturated fat, trans fat, and cholesterol in foods have to do with heart disease? Saturated fat, trans fat, and cholesterol in the diet all raise the level of LDL "bad" cholesterol in the blood. The higher the LDL cholesterol, the greater the risk for coronary heart disease (CHD). Saturated fat and trans fat raise LDL similarly.  What foods contain saturated fat, trans fat, and cholesterol? High amounts of saturated fat are found in animal products, such as fatty cuts of meat, chicken skin, and full-fat dairy products like butter, whole milk, cream, and cheese, and in tropical vegetable oils such as palm, palm kernel, and coconut oil. Trans fat is found in some of the same foods as saturated fat, such as vegetable shortening, some margarines (especially hard or stick margarine), crackers, cookies, baked goods, fried foods, salad dressings, and other processed foods made with partially hydrogenated vegetable oils. Small amounts of trans fat also occur naturally in some animal  products, such as milk products, beef, and lamb. Foods high in cholesterol include liver, other organ meats, egg yolks, shrimp, and full-fat dairy products. How can I use the new food label to make heart-healthy food choices? Check the Nutrition Facts panel of the food label. Choose foods lower in saturated fat, trans fat, and cholesterol. For saturated fat and cholesterol, you can also use the Percent Daily Value (%DV): 5% DV or less is low, and 20% DV or more is high. (There is no %DV for trans fat.) Use the Nutrition Facts panel to choose foods low in saturated fat and cholesterol, and if the trans fat is not listed, read the ingredients and limit products that list shortening or hydrogenated or partially hydrogenated vegetable oil, which tend to be high in trans fat. POINTS TO REMEMBER:   Discuss your risk for heart disease with your caregivers, and take steps to reduce risk factors.  Change your diet. Choose foods that are low in saturated fat, trans fat, and cholesterol.  Add exercise to your daily routine if   it is not already being done. Participate in physical activity of moderate intensity, like brisk walking, for at least 30 minutes on most, and preferably all days of the week. No time? Break the 30 minutes into three, 10-minute segments during the day.  Stop smoking. If you do smoke, contact your caregiver to discuss ways in which they can help you quit.  Do not use street drugs.  Maintain a normal weight.  Maintain a healthy blood pressure.  Keep up with your blood work for checking the fats in your blood as directed by your caregiver. Document Released: 06/05/2004 Document Revised: 02/17/2012 Document Reviewed: 01/01/2009 [employer] Patient Information 2013 [employer].  

## 2012-09-24 NOTE — Progress Notes (Signed)
Quick Note:  Please call pt and advise that the following labs are abnormal...  All labs normal except Total cholesterol and LDL ("bad") cholesterol are high; HDL ("good") cholesterol is high also so this is a good situation. Based on these values, your overall risk of heart disease is calculated to be below the average risk (almost half the average risk !- great news). However, 1 year ago, your numbers were excellent; you may have been taking medication at that time and stopped. If you are not interested in prescription medication to lower your total cholesterol, try to eat healthier and get OTC Fish Oil 1200 mg capsule and take 1 daily.   Do not take the Fish Oil with your other medications.   Copy to pt.  ______

## 2012-09-25 ENCOUNTER — Telehealth: Payer: Self-pay | Admitting: Family Medicine

## 2012-09-25 DIAGNOSIS — E7800 Pure hypercholesterolemia, unspecified: Secondary | ICD-10-CM

## 2012-09-25 NOTE — Telephone Encounter (Signed)
I called pt to discuss Lipid results. She had problems on statin and does not want to restart that med. She will get Fish Oil 1200 mg and take 1 daily. I will order repeat Lipid panel to be done in 3 months; pt aware to RTC for fasting lab in 3 months.

## 2012-09-25 NOTE — Progress Notes (Signed)
S:  This 72 y.o. AA female returns for HTN follow-up and labs to check cholesterol. Her primary cardiac care is with Dr. Herbie Baltimore; she is followed in the Coumadin clinic in that office. BP monitoring at home: SBP=125-140, DBP= 70-90. Pt feels well and has no medication side effects. Appetite is good and she maintains  low fat/low cholesterol nutrition except for the recent Holidays. She was taking Lipitor and starting noticing increased aching around knees. She was not sure if the statin was the cause; she discontinues the medication when the knee pain started to interfere w/ activities.  ROS: Negative for fatigue, abnormal weight change, diaphoresis, CP or tightness, palpitations, SOB or DOE, edema, cough, n/v, HA, vision changes, dizziness, numbness, weakness or syncope.  O:  Filed Vitals:   09/23/12 1110  BP: 136/75  Pulse: 65  Temp: 97.7 F (36.5 C)  Resp: 16   GEN: In NAD; WN,WD. HENT: [place]/AT; EOMI w/ clear conj/sclerae; oroph clear and moist. Otherwise unremarkable. NECK: No LAN or TMG. COR: RRR. LUNGS: Normal resp rate and effort. SKIN: W&D. MS: MAEs; knees w/ good ROM w/o erythema or effusion- not tender to touch. No deformities. NEURO: A&O x 3; CNs intact. Nonfocal.   A/P:  1. Hypercholesteremia  Lipid panel, Comprehensive metabolic panel  2. HTN (hypertension)  Lipid panel, Comprehensive metabolic panel  3. Medication refill  As below; pt to see CARDS for other med REs   Meds ordered this encounter  Medications  . lisinopril-hydrochlorothiazide (PRINZIDE,ZESTORETIC) 10-12.5 MG per tablet    Sig: Take 1 tablet by mouth daily. Needs office visit (second notice)    Dispense:  90 tablet    Refill:  3  . metoprolol tartrate (LOPRESSOR) 25 MG tablet    Sig: Take 1 tablet (25 mg total) by mouth daily.    Dispense:  90 tablet    Refill:  3  . omeprazole (PRILOSEC) 20 MG capsule    Sig: Take 1 capsule (20 mg total) by mouth daily. TAKE PRN    Dispense:  30 capsule    Refill:   3

## 2012-10-18 ENCOUNTER — Telehealth: Payer: Self-pay

## 2012-10-18 NOTE — Telephone Encounter (Signed)
Pt has questions about her medications believe the miligrams have been changed and was not sure if this is what the provider wants or did the pharmacy make a mistake  Best number [phone]

## 2012-10-19 NOTE — Telephone Encounter (Signed)
Called patient she is asking about the Metoprolol. She states previously the Cardiologist had her on Metoprolol ER, but her Rx from [place] is for regular Metoprolol. Please advise.

## 2012-10-20 ENCOUNTER — Other Ambulatory Visit: Payer: Self-pay | Admitting: Family Medicine

## 2012-10-20 MED ORDER — METOPROLOL SUCCINATE ER 25 MG PO TB24: 25.0000 mg | ORAL_TABLET | Freq: Every day | ORAL | Status: DC

## 2012-10-20 NOTE — Telephone Encounter (Signed)
I spoke with the pharmacist at [facility]; pt picked up Metoprolol ER 25 mg  #30 tabs (2 RFs remaining) on 09/17/12. She has the correct medication.

## 2012-11-18 ENCOUNTER — Ambulatory Visit: Payer: Self-pay | Admitting: Cardiology

## 2012-11-18 DIAGNOSIS — I480 Paroxysmal atrial fibrillation: Secondary | ICD-10-CM

## 2012-11-18 DIAGNOSIS — Z7901 Long term (current) use of anticoagulants: Secondary | ICD-10-CM | POA: Insufficient documentation

## 2012-12-01 ENCOUNTER — Telehealth: Payer: Self-pay

## 2012-12-01 NOTE — Telephone Encounter (Signed)
PATIENT SAID SHE SAW OUR NUMBER ON HER CALLER ID AND WANTED TO KNOW WHY WE CALLED. ASKED PATIENT IF SHE HAS BEEN IN RECENTLY AND DID SHE HAVE LAB TESTS DONE. PATIENT REPLIED SHE HAS NOT BEEN IN OUR OFFICE FOR A WHILE. STILL WANTS TO KNOW WHY WE CALLED...   [phone]

## 2012-12-01 NOTE — Telephone Encounter (Signed)
I did not call her.

## 2012-12-01 NOTE — Telephone Encounter (Signed)
No calls from here, her next appt is July so it wouldn't be a reminder call.Marland KitchenMarland Kitchen

## 2012-12-01 NOTE — Telephone Encounter (Signed)
Did you guys call her?

## 2012-12-20 ENCOUNTER — Ambulatory Visit (INDEPENDENT_AMBULATORY_CARE_PROVIDER_SITE_OTHER): Payer: Medicare PPO | Admitting: Emergency Medicine

## 2012-12-20 VITALS — BP 126/76 | HR 78 | Temp 98.1°F | Resp 16 | Ht 63.0 in | Wt 143.0 lb

## 2012-12-20 DIAGNOSIS — K219 Gastro-esophageal reflux disease without esophagitis: Secondary | ICD-10-CM

## 2012-12-20 DIAGNOSIS — I4891 Unspecified atrial fibrillation: Secondary | ICD-10-CM

## 2012-12-20 DIAGNOSIS — I4892 Unspecified atrial flutter: Secondary | ICD-10-CM

## 2012-12-20 DIAGNOSIS — R42 Dizziness and giddiness: Secondary | ICD-10-CM

## 2012-12-20 NOTE — Patient Instructions (Addendum)

## 2012-12-20 NOTE — Progress Notes (Signed)
[facility] [address], [place] [address] [phone]  Date:  12/20/2012   Name:  Donna Simmons   DOB:  1941/08/14   MRN:  [id number]  PCP:  No primary provider on file.    Chief Complaint: Dizziness   History of Present Illness:  Donna Simmons is a 72 y.o. very pleasant female patient who presents with the following:  History of atrial fibrillation.  Today was in the yard around 1700 today and developed a strong sense of a fluttering in her chest and a burning in her epigastrium that radiated up into her mid chest.  Symptoms lasted about 10 minutes and spontaneously stopped.  Still having a fluttering feeling. No associated symptoms.  History of GERD and says the burning in her chest is similar to that she experiences with her GERD.  No improvement with over the counter medications or other home remedies. Denies other complaint or health concern today.   Patient Active Problem List  Diagnosis  . Paroxysmal atrial fibrillation  . HTN (hypertension)  . DDD (degenerative disc disease), cervical  . Hypercholesteremia  . Long term (current) use of anticoagulants    Past Medical History  Diagnosis Date  . Atrial fibrillation   . Hypertension   . Hypercholesteremia   . GERD (gastroesophageal reflux disease)     Past Surgical History  Procedure Laterality Date  . Breast surgery    . Fracture surgery    . Abdominal hysterectomy      History  Substance Use Topics  . Smoking status: Never Smoker   . Smokeless tobacco: Not on file  . Alcohol Use: No    Family History  Problem Relation Age of Onset  . Arthritis Mother     OSTEO  . Hypertension Mother   . Dementia Mother   . Heart disease Father   . Hypertension Sister   . Dementia Sister   . Hypertension Brother   . Cancer Brother   . Cancer Maternal Grandmother   . Heart disease Maternal Grandfather     No Known Allergies  Medication list has been reviewed and updated.  Current  Outpatient Prescriptions on File Prior to Visit  Medication Sig Dispense Refill  . flecainide (TAMBOCOR) 50 MG tablet Take 50 mg by mouth daily.       Marland Kitchen lisinopril-hydrochlorothiazide (PRINZIDE,ZESTORETIC) 10-12.5 MG per tablet Take 1 tablet by mouth daily. Needs office visit (second notice)  90 tablet  3  . metoprolol succinate (TOPROL-XL) 25 MG 24 hr tablet Take 1 tablet (25 mg total) by mouth daily.  30 tablet  2  . Multiple Vitamin (MULTI-VITAMIN DAILY PO) Take by mouth. FOR WOMEN      . omeprazole (PRILOSEC) 20 MG capsule Take 1 capsule (20 mg total) by mouth daily. TAKE PRN  30 capsule  3  . warfarin (COUMADIN) 5 MG tablet Take 1 tablet every day except take 1/2 tablet on Monday and Friday       No current facility-administered medications on file prior to visit.    Review of Systems:  As per HPI, otherwise negative.    Physical Examination: Filed Vitals:   12/20/12 1905  BP: 126/76  Pulse: 78  Temp: 98.1 F (36.7 C)  Resp: 16   Filed Vitals:   12/20/12 1905  Height: 5\' 3"  (1.6 m)  Weight: 143 lb (64.864 kg)   Body mass index is 25.34 kg/(m^2). Ideal Body Weight: Weight in (lb) to have BMI =  25: 140.8  GEN: WDWN, NAD, Non-toxic, A & O x 3 HEENT: Atraumatic, Normocephalic. Neck supple. No masses, No LAD. Ears and Nose: No external deformity. CV: RRR, No M/G/R. No JVD. No thrill. No extra heart sounds. PULM: CTA B, no wheezes, crackles, rhonchi. No retractions. No resp. distress. No accessory muscle use. ABD: S, NT, ND, +BS. No rebound. No HSM. EXTR: No c/c/e NEURO Normal gait.  PSYCH: Normally interactive. Conversant. Not depressed or anxious appearing.  Calm demeanor.    Assessment and Plan: Atrial fibrillation by history Currently in RSR with occasional PAC's  No acute change Follow up with cardiologist tomorrow GERD by history GI cocktail   Signed,  Phillips Odor, MD

## 2013-01-10 ENCOUNTER — Encounter: Payer: Self-pay | Admitting: Pharmacist Clinician (PhC)/ Clinical Pharmacy Specialist

## 2013-01-11 ENCOUNTER — Ambulatory Visit (INDEPENDENT_AMBULATORY_CARE_PROVIDER_SITE_OTHER): Payer: Medicare PPO | Admitting: Pharmacist Clinician (PhC)/ Clinical Pharmacy Specialist

## 2013-01-11 VITALS — BP 112/68 | HR 64

## 2013-01-11 DIAGNOSIS — I480 Paroxysmal atrial fibrillation: Secondary | ICD-10-CM

## 2013-01-11 DIAGNOSIS — I4891 Unspecified atrial fibrillation: Secondary | ICD-10-CM

## 2013-01-11 DIAGNOSIS — Z7901 Long term (current) use of anticoagulants: Secondary | ICD-10-CM

## 2013-01-11 LAB — POCT INR: INR: 2.3

## 2013-01-11 NOTE — Progress Notes (Signed)
Looks good

## 2013-01-11 NOTE — Patient Instructions (Addendum)
Continue current does, repeat INR in 3 weeks

## 2013-02-01 ENCOUNTER — Ambulatory Visit (INDEPENDENT_AMBULATORY_CARE_PROVIDER_SITE_OTHER): Payer: Medicare PPO | Admitting: Pharmacist Clinician (PhC)/ Clinical Pharmacy Specialist

## 2013-02-01 VITALS — BP 130/82 | HR 60

## 2013-02-01 DIAGNOSIS — I4891 Unspecified atrial fibrillation: Secondary | ICD-10-CM

## 2013-02-01 DIAGNOSIS — Z7901 Long term (current) use of anticoagulants: Secondary | ICD-10-CM

## 2013-02-01 DIAGNOSIS — I480 Paroxysmal atrial fibrillation: Secondary | ICD-10-CM

## 2013-02-01 LAB — POCT INR: INR: 2.4

## 2013-02-11 ENCOUNTER — Encounter: Payer: Self-pay | Admitting: Cardiology

## 2013-03-01 ENCOUNTER — Ambulatory Visit: Payer: Medicare PPO | Admitting: Cardiology

## 2013-03-01 ENCOUNTER — Ambulatory Visit: Payer: Medicare PPO | Admitting: Pharmacist Clinician (PhC)/ Clinical Pharmacy Specialist

## 2013-03-07 ENCOUNTER — Ambulatory Visit: Payer: Medicare PPO | Admitting: Cardiology

## 2013-03-07 ENCOUNTER — Ambulatory Visit: Payer: Medicare PPO | Admitting: Pharmacist Clinician (PhC)/ Clinical Pharmacy Specialist

## 2013-03-16 ENCOUNTER — Encounter: Payer: Self-pay | Admitting: Family Medicine

## 2013-03-16 ENCOUNTER — Ambulatory Visit (INDEPENDENT_AMBULATORY_CARE_PROVIDER_SITE_OTHER): Payer: Medicare PPO | Admitting: Family Medicine

## 2013-03-16 VITALS — BP 130/77 | HR 55 | Temp 97.8°F | Resp 16 | Ht 64.5 in | Wt 143.0 lb

## 2013-03-16 DIAGNOSIS — I10 Essential (primary) hypertension: Secondary | ICD-10-CM

## 2013-03-16 DIAGNOSIS — J309 Allergic rhinitis, unspecified: Secondary | ICD-10-CM

## 2013-03-16 DIAGNOSIS — Z139 Encounter for screening, unspecified: Secondary | ICD-10-CM

## 2013-03-16 DIAGNOSIS — Z0000 Encounter for general adult medical examination without abnormal findings: Secondary | ICD-10-CM

## 2013-03-16 DIAGNOSIS — Z862 Personal history of diseases of the blood and blood-forming organs and certain disorders involving the immune mechanism: Secondary | ICD-10-CM

## 2013-03-16 DIAGNOSIS — E7800 Pure hypercholesterolemia, unspecified: Secondary | ICD-10-CM

## 2013-03-16 LAB — CBC WITH DIFFERENTIAL/PLATELET
Eosinophils Relative: 1 % (ref 0–5)
HCT: 37.8 % (ref 36.0–46.0)
Lymphocytes Relative: 38 % (ref 12–46)
Lymphs Abs: 1.4 10*3/uL (ref 0.7–4.0)
MCV: 80.6 fL (ref 78.0–100.0)
Monocytes Absolute: 0.3 10*3/uL (ref 0.1–1.0)
Neutro Abs: 2 10*3/uL (ref 1.7–7.7)
RBC: 4.69 MIL/uL (ref 3.87–5.11)
WBC: 3.7 10*3/uL — ABNORMAL LOW (ref 4.0–10.5)

## 2013-03-16 LAB — POCT URINALYSIS DIPSTICK
Bilirubin, UA: NEGATIVE
Leukocytes, UA: NEGATIVE
Nitrite, UA: NEGATIVE
Protein, UA: NEGATIVE
Urobilinogen, UA: 0.2
pH, UA: 7

## 2013-03-16 LAB — T4, FREE: Free T4: 1.34 ng/dL (ref 0.80–1.80)

## 2013-03-16 LAB — BASIC METABOLIC PANEL
Calcium: 9.4 mg/dL (ref 8.4–10.5)
Creat: 0.97 mg/dL (ref 0.50–1.10)

## 2013-03-16 LAB — TSH: TSH: 2.393 u[IU]/mL (ref 0.350–4.500)

## 2013-03-16 LAB — LIPID PANEL: Cholesterol: 283 mg/dL — ABNORMAL HIGH (ref 0–200)

## 2013-03-16 MED ORDER — FLUTICASONE PROPIONATE 50 MCG/ACT NA SUSP: NASAL | Status: DC

## 2013-03-16 NOTE — Patient Instructions (Signed)
Keeping You Healthy  Get These Tests  Blood Pressure- Have your blood pressure checked by your healthcare provider at least once a year.  Normal blood pressure is 120/80.  Weight- Have your body mass index (BMI) calculated to screen for obesity.  BMI is a measure of body fat based on height and weight.  You can calculate your own BMI at https://www.west-esparza.com/  Cholesterol- Have your cholesterol checked every year.  Diabetes- Have your blood sugar checked every year if you have high blood pressure, high cholesterol, a family history of diabetes or if you are overweight.  Pap Smear- Have a pap smear every 1 to 3 years if you have been sexually active.  If you are older than 65 and recent pap smears have been normal you may not need additional pap smears.  In addition, if you have had a hysterectomy  For benign disease additional pap smears are not necessary.  Mammogram-Yearly mammograms are essential for early detection of breast cancer  Screening for Colon Cancer- Colonoscopy starting at age 1. Screening may begin sooner depending on your family history and other health conditions.  Follow up colonoscopy as directed by your Gastroenterologist.  Screening for Osteoporosis- Screening begins at age 66 with bone density scanning, sooner if you are at higher risk for developing Osteoporosis.  Get these medicines  Calcium with Vitamin D- Your body requires 1200-1500 mg of Calcium a day and [phone] IU of Vitamin D a day.  You can only absorb 500 mg of Calcium at a time therefore Calcium must be taken in 2 or 3 separate doses throughout the day.  Hormones- Hormone therapy has been associated with increased risk for certain cancers and heart disease.  Talk to your healthcare provider about if you need relief from menopausal symptoms.  Aspirin- Ask your healthcare provider about taking Aspirin to prevent Heart Disease and Stroke.  Get these Immunizations- all immunizations are current.  Flu  shot- Every fall  Pneumonia shot- Once after the age of 64; if you are younger ask your healthcare provider if you need a pneumonia shot.  Tetanus- Every ten years.  Zostavax- Once after the age of 80 to prevent shingles.  Take these steps  Don't smoke- Your healthcare provider can help you quit. For tips on how to quit, ask your healthcare provider or go to www.smokefree.gov or call 1-800 QUIT-NOW.  Be physically active- Exercise 5 days a week for a minimum of 30 minutes.  If you are not already physically active, start slow and gradually work up to 30 minutes of moderate physical activity.  Try walking, dancing, bike riding, swimming, etc.  Eat a healthy diet- Eat a variety of healthy foods such as fruits, vegetables, whole grains, low fat milk, low fat cheeses, yogurt, lean meats, chicken, fish, eggs, dried beans, tofu, etc.  For more information go to www.thenutritionsource.org  Dental visit- Brush and floss teeth twice daily; visit your dentist twice a year.  Eye exam- Visit your Optometrist or Ophthalmologist yearly.  Drink alcohol in moderation- Limit alcohol intake to one drink or less a day.  Never drink and drive.  Depression- Your emotional health is as important as your physical health.  If you're feeling down or losing interest in things you normally enjoy, please talk to your healthcare provider.  Seat Belts- can save your life; always wear one  Smoke/Carbon Monoxide detectors- These detectors need to be installed on the appropriate level of your home.  Replace batteries at least once a year.  Violence- If anyone is threatening or hurting you, please tell your healthcare provider.  Living Will/ Health care power of attorney- Discuss with your healthcare provider and family.

## 2013-03-16 NOTE — Progress Notes (Signed)
Subjective:    Patient ID: Donna Simmons, female    DOB: 07/02/1941, 72 y.o.   MRN: [id number]  HPI  This 72 y.o. AA female is here for [facility] annual subsequent CPE.  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 Exercsie- walking several times /week for 2 hours.   HCM: PAP- 2012 (normal)           MMG- 2013 (normal)           ECG- at Cardiology visits           DEXA- not done           CRS- 2012 (polyps)           IMM- current  Patient Active Problem List   Diagnosis Date Noted  . Long term (current) use of anticoagulants 11/18/2012  . Hypercholesteremia 09/25/2012  . DDD (degenerative disc disease), cervical 05/19/2012  . Paroxysmal atrial fibrillation 04/20/2012  . HTN (hypertension) 04/20/2012    PMHx, Soc Hx and Fam Hx reviewed.   Review of Systems  Constitutional: Positive for diaphoresis and fatigue.  HENT: Positive for trouble swallowing, dental problem, postnasal drip and sinus pressure.        Has allergic rhinitis but not using any medication.  Eyes:       Periodic vision eval w/ specialist.  Respiratory: Negative.   Cardiovascular: Positive for palpitations.       Chronic arrhythmia monitored and treated by Cardiology.  Endocrine:       "Internal " hot flashes and dizzy episodes occuring once a week.  Genitourinary: Positive for urgency.       S/p TAH and symptoms have increased since that surgery. Wears pads.  Musculoskeletal: Positive for back pain.  Skin: Positive for color change.  Allergic/Immunologic: Positive for environmental allergies.  Neurological: Positive for dizziness and light-headedness.  Hematological: Bruises/bleeds easily.  Psychiatric/Behavioral: Negative.        HANDS Questionnaire for Depression screening score= 2       Objective:   Physical Exam  Nursing note and vitals reviewed. Constitutional: She is oriented to person, place, and time. Vital signs are normal. She appears well-developed and well-nourished. No distress.   HENT:  Head: Normocephalic and atraumatic.  Right Ear: Hearing, external ear and ear canal normal. Tympanic membrane is scarred.  Left Ear: Hearing, external ear and ear canal normal. Tympanic membrane is scarred.  Nose: Mucosal edema present. No rhinorrhea, sinus tenderness, nasal deformity or septal deviation. Right sinus exhibits no maxillary sinus tenderness and no frontal sinus tenderness. Left sinus exhibits no maxillary sinus tenderness and no frontal sinus tenderness.  Mouth/Throat: Uvula is midline, oropharynx is clear and moist and mucous membranes are normal. No oral lesions. Normal dentition. No dental caries.  Eyes: Conjunctivae, EOM and lids are normal. Pupils are equal, round, and reactive to light. No scleral icterus.  Fundoscopic exam:      The right eye shows no papilledema. The right eye shows red reflex.       The left eye shows no papilledema. The left eye shows red reflex.  Neck: Normal range of motion. Neck supple. No JVD present. No thyromegaly present.  Cardiovascular: Normal rate, regular rhythm, normal heart sounds and intact distal pulses.  Exam reveals no gallop and no friction rub.   No murmur heard. Pulmonary/Chest: Effort normal and breath sounds normal. No respiratory distress. She has no wheezes. Right breast exhibits no inverted nipple, no mass, no nipple discharge, no skin change  and no tenderness. Left breast exhibits no inverted nipple, no mass, no nipple discharge, no skin change and no tenderness. Breasts are symmetrical.  Abdominal: Soft. Normal appearance and bowel sounds are normal. She exhibits no distension, no abdominal bruit, no pulsatile midline mass and no mass. There is no hepatosplenomegaly. There is no tenderness. There is no guarding and no CVA tenderness. No hernia.  Genitourinary: Rectum normal. Rectal exam shows no external hemorrhoid, no fissure, no mass, no tenderness and anal tone normal. Guaiac negative stool.  NEFG; no PAP or pelvic exam  performed.  Musculoskeletal: Normal range of motion. She exhibits no edema and no tenderness.  Lymphadenopathy:    She has no cervical adenopathy.  Neurological: She is alert and oriented to person, place, and time. She has normal strength. She is not disoriented. She displays no atrophy. No cranial nerve deficit or sensory deficit. She exhibits normal muscle tone. Coordination and gait normal.  Reflex Scores:      Tricep reflexes are 1+ on the right side and 1+ on the left side.      Bicep reflexes are 1+ on the right side and 1+ on the left side.      Patellar reflexes are 1+ on the right side and 1+ on the left side.      Achilles reflexes are 1+ on the right side and 0 on the left side. Skin: Skin is warm and dry. No rash noted. No erythema.  Psychiatric: She has a normal mood and affect. Her behavior is normal. Judgment and thought content normal.    Results for orders placed in visit on 03/16/13  POCT URINALYSIS DIPSTICK      Result Value Range   Color, UA yellow     Clarity, UA clear     Glucose, UA neg     Bilirubin, UA neg     Ketones, UA trace     Spec Grav, UA 1.020     Blood, UA moderate     pH, UA 7.0     Protein, UA neg     Urobilinogen, UA 0.2     Nitrite, UA neg     Leukocytes, UA Negative    IFOBT (OCCULT BLOOD)      Result Value Range   IFOBT Negative          Assessment & Plan:  Routine general medical examination at a health care facility - Plan: POCT urinalysis dipstick, IFOBT POC (occult bld, rslt in office)  Pure hypercholesterolemia - Plan: Lipid panel  HTN (hypertension) - Stable; continue current medications and follow-up w/ Dr. Herbie Baltimore.  Plan: Basic metabolic panel, TSH, T4, Free  Allergic rhinitis- RX; Fluticasone NS 1 spray EN hs.  History of anemia - Plan: CBC with Differential

## 2013-03-17 NOTE — Progress Notes (Signed)
Quick Note:  Please contact pt and advise that the following labs are abnormal... Lipid panel still shows Total and LDL ("bad") cholesterol above normal. HDL ("good") cholesterol is high which is very good. You are current ly taking Fish Oil 2 grams daily. I think a medication like Pravastatin -low dose or Lipitor- low dose needs to be resumed. You were taking Lipitor 10 mg until April of this year. If you wish to restart one of these "statin" medications, let me know that I can order the med for you.  Thyroid tests are normal. Complete blood counts are normal. Sodium, potassium, calcium and blood sugar as well as kidney function are all normal.  Copy to pt. ______

## 2013-03-18 ENCOUNTER — Other Ambulatory Visit: Payer: Self-pay | Admitting: Family Medicine

## 2013-03-18 MED ORDER — ATORVASTATIN CALCIUM 10 MG PO TABS: 10.0000 mg | ORAL_TABLET | Freq: Every day | ORAL | Status: DC

## 2013-03-23 ENCOUNTER — Ambulatory Visit: Payer: Medicare PPO | Admitting: Cardiology

## 2013-03-23 ENCOUNTER — Ambulatory Visit (INDEPENDENT_AMBULATORY_CARE_PROVIDER_SITE_OTHER): Payer: Medicare PPO | Admitting: Pharmacist Clinician (PhC)/ Clinical Pharmacy Specialist

## 2013-03-23 ENCOUNTER — Encounter: Payer: Self-pay | Admitting: Cardiology

## 2013-03-23 ENCOUNTER — Ambulatory Visit (INDEPENDENT_AMBULATORY_CARE_PROVIDER_SITE_OTHER): Payer: Medicare PPO | Admitting: Cardiology

## 2013-03-23 VITALS — BP 130/90 | HR 59 | Ht 64.0 in | Wt 142.9 lb

## 2013-03-23 DIAGNOSIS — I480 Paroxysmal atrial fibrillation: Secondary | ICD-10-CM

## 2013-03-23 DIAGNOSIS — I10 Essential (primary) hypertension: Secondary | ICD-10-CM

## 2013-03-23 DIAGNOSIS — Z7901 Long term (current) use of anticoagulants: Secondary | ICD-10-CM

## 2013-03-23 DIAGNOSIS — I4891 Unspecified atrial fibrillation: Secondary | ICD-10-CM

## 2013-03-23 DIAGNOSIS — E7800 Pure hypercholesterolemia, unspecified: Secondary | ICD-10-CM

## 2013-03-23 NOTE — Patient Instructions (Addendum)
Things seem pretty stable.  Our trick for stopping your spells seemed to work.  I want to keep your medications as they are now.  I also agree with restarting the Atorvastatin/Lipitor for cholesterol.  Continue to follow your INRs with Belenda Cruise.  I will see you back in 6 months.  Marykay Lex, MD

## 2013-03-24 ENCOUNTER — Ambulatory Visit: Payer: Medicare PPO | Admitting: Family Medicine

## 2013-04-08 ENCOUNTER — Other Ambulatory Visit: Payer: Self-pay | Admitting: *Deleted

## 2013-04-08 ENCOUNTER — Encounter: Payer: Self-pay | Admitting: Cardiology

## 2013-04-08 MED ORDER — WARFARIN SODIUM 5 MG PO TABS: 2.5000 mg | ORAL_TABLET | ORAL | Status: DC

## 2013-04-08 NOTE — Assessment & Plan Note (Signed)
Cholesterol panel looks rough, no real change from her January panel. She does need to be back on her statin, and there may need to be a very increased or switched over to a different statin versus adding Zetia. Goal LDL below 130.

## 2013-04-08 NOTE — Assessment & Plan Note (Signed)
More stable now on twice a day flecainide with the additional half dose of metoprolol. The remaining 12.5 mg of metoprolol is being used as a when necessary.  Continue with current management.

## 2013-04-08 NOTE — Assessment & Plan Note (Signed)
Well-controlled. The diastolic pressure is little high. On the beta blocker dose as she is taking along with ACE inhibitor HCTZ combination to be pretty well-controlled. No need to change.

## 2013-04-08 NOTE — Progress Notes (Signed)
Patient ID: Donna Simmons, female   DOB: 04/14/1941, 72 y.o.   MRN: [id number]  PCP: Dow Adolph, MD  Clinic Note: Chief Complaint  Patient presents with  . 2 month visit    no chest pain ,no sob,one incident when it felt like the heartbeat was long and I felt dizzy,no edema   HPI: Donna Simmons is a 72 y.o. female with a PMH below who presents today for followup after medication adjustments for her atrial fibrillation. I saw her back in May when she was noting increasing frequency of short lived episodes of tachycardia which quite likely to be atrial fibrillation. I increased her metoprolol to 25 the morning and 12.5 the evening and increase her flexion and 50 twice a day. She is now here for two-month followup.  I also restarted her atorvastatin at the last visit..  Interval History: Since the last visit. She did note relatively well she had one episode she felt that her was going pretty fast and there was a pause and it broke. It should be a little bit dizzy with it as she usually does feel dizzy with heart rates going fast. Pedal and L1 episode, compared to several episodes before. She is happy with how or plan is working. She's only had to use the additional half dose of metoprolol on one occasion which is what is described. She is extremely sensitive for when she is at her fibrillation feeling a sense of pushing and holding her chest. It was significant enough to be considered to be possibly an anginal equivalent I first met her. Besides the one episode of mention, she denied any of these symptoms.  Otherwise the remainder of cardiac review of systems is as follows: Cardiovascular ROS: no chest pain or dyspnea on exertion negative for - chest pain, dyspnea on exertion, edema, loss of consciousness, murmur, orthopnea, paroxysmal nocturnal dyspnea or shortness of breath. She denies any other episodes of dizziness, no syncope or near-syncope. No TIA or amaurosis fugax symptoms. No  melena, hematochezia or hematuria.  Past Medical History  Diagnosis Date  . Paroxysmal atrial fibrillation 12/27/2008  . Hypertension   . Hypercholesteremia   . GERD (gastroesophageal reflux disease)   . Palpitations 01/10/2009    14 day monitor- some sinus tachycardia, PVCs    Prior Cardiac Evaluation and Past Surgical History: Past Surgical History  Procedure Laterality Date  . Breast surgery    . Fracture surgery    . Abdominal hysterectomy  1980  . Cardiac catheterization  12/16/2010    no evidence of CAD to explain anginal pain w/ positive troponin.  potential etiology is breakthrough AF  . Transthoracic echocardiogram  April 2010    Echo - EF >55; RV mils/mod dilated, RV systolic function mildly reduced; RA mild/moderately dilated; moderate pulmonary hypertension; mild tricuspid regurgitation  . Nm myoview ltd  April 2010    EF 64%, normal pattern of perfusion in all regions, no scintigraphic evidence of inducible ischemia; no significant wall motion abnormalities; EKG negative for ischemia; no significant change from last study; low risk scan   No Known Allergies  Current Outpatient Prescriptions  Medication Sig Dispense Refill  . atorvastatin (LIPITOR) 10 MG tablet Take 1 tablet (10 mg total) by mouth daily.  90 tablet  3  . flecainide (TAMBOCOR) 50 MG tablet Take 50 mg by mouth 2 (two) times daily.       . fluticasone (FLONASE) 50 MCG/ACT nasal spray Spray once in each nostril at bedtime.  16 g  11  . lisinopril-hydrochlorothiazide (PRINZIDE,ZESTORETIC) 10-12.5 MG per tablet Take 1 tablet by mouth daily. Needs office visit (second notice)  90 tablet  3  . metoprolol succinate (TOPROL-XL) 25 MG 24 hr tablet Take 25 mg by mouth as directed. Take 1.5 tablets (37.5mg ) once daily      . Multiple Vitamin (MULTI-VITAMIN DAILY PO) Take by mouth. FOR WOMEN      . omeprazole (PRILOSEC) 20 MG capsule Take 1 capsule (20 mg total) by mouth daily. TAKE PRN  30 capsule  3  . warfarin  (COUMADIN) 5 MG tablet Take 0.5-1 tablets (2.5-5 mg total) by mouth as directed.  30 tablet  3   No current facility-administered medications for this visit.    History   Social History  . Marital Status: Married    Spouse Name: N/A    Number of Children: N/A  . Years of Education: N/A   Occupational History  . Not on file.   Social History Main Topics  . Smoking status: Never Smoker   . Smokeless tobacco: Not on file  . Alcohol Use: No  . Drug Use: No  . Sexually Active: Yes   Other Topics Concern  . Not on file   Social History Narrative   Married with no children. Walks several times a week at least 3-4 times a week about 2 miles a time. Does not smoke and does not drink.    ROS: A comprehensive Review of Systems - Negative except Pertinent positives noted above. No other positives noted.  PHYSICAL EXAM BP 130/90  Pulse 59  Ht 5\' 4"  (1.626 m)  Wt 142 lb 14.4 oz (64.819 kg)  BMI 24.52 kg/m2 General appearance: alert, cooperative, appears stated age, no distress and Well-nourished, well-groomed. Normal mood and affect. Neck: no adenopathy, no carotid bruit, no JVD and supple, symmetrical, trachea midline Lungs: clear to auscultation bilaterally, normal percussion bilaterally and Nonlabored, good air movement Heart: Bradycardic rate, normal rhythm, S1, S2 normal, no murmur, click, rub or gallop and normal apical impulse Abdomen: soft, non-tender; bowel sounds normal; no masses,  no organomegaly Extremities: extremities normal, atraumatic, no cyanosis or edema, no edema, redness or tenderness in the calves or thighs and no ulcers, gangrene or trophic changes Pulses: 2+ and symmetric Neurologic: Grossly normal  [id number] today: Yes Rate: 59 , Rhythm: Sinus bradycardia, nonspecific ST changes, otherwise normal.  Recent Labs: 03/16/2013: Total cholesterol 283, triglycerides 74, LDL 197, HDL 71; chemistry panel normal. CBC normal.  ASSESSMENT / PLAN:  Paroxysmal  atrial fibrillation More stable now on twice a day flecainide with the additional half dose of metoprolol. The remaining 12.5 mg of metoprolol is being used as a when necessary.  Continue with current management.  HTN (hypertension) Well-controlled. The diastolic pressure is little high. On the beta blocker dose as she is taking along with ACE inhibitor HCTZ combination to be pretty well-controlled. No need to change.  Hypercholesteremia Cholesterol panel looks rough, no real change from her January panel. She does need to be back on her statin, and there may need to be a very increased or switched over to a different statin versus adding Zetia. Goal LDL below 130.  Long term (current) use of anticoagulants On warfarin, record by Phillips Hay, Pharm.D. here in the office.   Orders Placed This Encounter  Procedures  . EKG 12-Lead   No orders of the defined types were placed in this encounter.   Followup: 6 months  DAVID W. HARDING,  M.D., M.S. [facility] [address] [place], [place]  [address]  [phone] Pager # [phone]

## 2013-04-08 NOTE — Telephone Encounter (Signed)
Rx was sent to pharmacy electronically. 

## 2013-04-08 NOTE — Assessment & Plan Note (Signed)
On warfarin, record by Phillips Hay, Pharm.D. here in the office.

## 2013-04-21 ENCOUNTER — Ambulatory Visit (INDEPENDENT_AMBULATORY_CARE_PROVIDER_SITE_OTHER): Payer: Medicare PPO | Admitting: Pharmacist Clinician (PhC)/ Clinical Pharmacy Specialist

## 2013-04-21 VITALS — BP 146/90 | HR 64

## 2013-04-21 DIAGNOSIS — Z7901 Long term (current) use of anticoagulants: Secondary | ICD-10-CM

## 2013-04-21 DIAGNOSIS — I480 Paroxysmal atrial fibrillation: Secondary | ICD-10-CM

## 2013-04-21 DIAGNOSIS — I4891 Unspecified atrial fibrillation: Secondary | ICD-10-CM

## 2013-04-26 ENCOUNTER — Ambulatory Visit (INDEPENDENT_AMBULATORY_CARE_PROVIDER_SITE_OTHER): Payer: Medicare PPO | Admitting: Family Medicine

## 2013-04-26 ENCOUNTER — Encounter: Payer: Self-pay | Admitting: Family Medicine

## 2013-04-26 VITALS — BP 129/74 | HR 72 | Temp 97.4°F | Resp 16 | Ht 64.0 in | Wt 148.0 lb

## 2013-04-26 DIAGNOSIS — Z111 Encounter for screening for respiratory tuberculosis: Secondary | ICD-10-CM

## 2013-04-26 NOTE — Patient Instructions (Signed)
You had a PPD (TB skin test) placed today. It will need to be looked at in 48-72 hours. 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 Daisey Must to you for this academic year!

## 2013-04-26 NOTE — Progress Notes (Signed)
Subjective:    Patient ID: Donna Simmons, female    DOB: 11/20/1940, 72 y.o.   MRN: [id number]  HPI This 72 y.o. 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 [facility] requires a TB skin test w/ form completion. The pt is in good health, having recently had a CPE. She has routine follow-up w/ Cardiology for  evaluation of atrial fibrillation.    Review of Systems  Cardiovascular: Positive for palpitations.  All other systems reviewed and are negative.       Objective:   Physical Exam  Nursing note and vitals reviewed. Constitutional: She is oriented to person, place, and time. She appears well-developed and well-nourished. No distress.  HENT:  Head: Normocephalic and atraumatic.  Right Ear: External ear normal.  Left Ear: External ear normal.  Nose: Nose normal.  Mouth/Throat: Oropharynx is clear and moist. No oropharyngeal exudate.  Eyes: Conjunctivae and EOM are normal. Pupils are equal, round, and reactive to light. No scleral icterus.  Neck: Normal range of motion. Neck supple. No thyromegaly present.  Cardiovascular: Normal rate, regular rhythm and normal heart sounds.  Exam reveals no gallop.   No murmur heard. Pulmonary/Chest: Effort normal and breath sounds normal. No respiratory distress.  Musculoskeletal: Normal range of motion. She exhibits no edema.  Lymphadenopathy:    She has no cervical adenopathy.  Neurological: She is alert and oriented to person, place, and time. No cranial nerve deficit. She exhibits normal muscle tone. Coordination normal.  Skin: Skin is warm and dry.  Psychiatric: She has a normal mood and affect. Her behavior is normal. Judgment and thought content normal.     A/P: Screening-pulmonary TB - Plan: TB Skin Test PPD placed and pt advised to return in 48-72 hours for reading.         Assessment & Plan:   Tuberculosis Risk Questionnaire  1. No  Were you born outside the [place] in one of the following parts of the world: [place], [place], [place], [place] or [place]?    2. No Have you traveled outside the [place] and lived for more than one month in one of the following parts of the world: [place], [place], [place], [place] or [place]?    3. No Do you have a compromised immune system such as from any of the following conditions:HIV/AIDS, organ or bone marrow transplantation, diabetes, immunosuppressive medicines (e.g. Prednisone, Remicaide), leukemia, lymphoma, cancer of the head or neck, gastrectomy or jejunal bypass, end-stage renal disease (on dialysis), or silicosis?     4. No Have you ever or do you plan on working in: a residential care center, a health care facility, a jail or prison or homeless shelter?    5. No Have you ever: injected illegal drugs, used crack cocaine, lived in a homeless shelter  or been in jail or prison?     6. no Have you ever been exposed to anyone with infectious tuberculosis?    Tuberculosis Symptom Questionnaire  Do you currently have any of the following symptoms?  1. No Unexplained cough lasting more than 3 weeks?   2. No Unexplained fever lasting more than 3 weeks.   3. No Night Sweats (sweating that leaves the bedclothes and sheets wet)     4. No Shortness of Breath   5. No Chest Pain   6. No Unintentional weight loss    7. No Unexplained  fatigue (very tired for no reason)

## 2013-04-28 ENCOUNTER — Other Ambulatory Visit: Payer: Self-pay | Admitting: Cardiology

## 2013-04-28 ENCOUNTER — Encounter (INDEPENDENT_AMBULATORY_CARE_PROVIDER_SITE_OTHER): Payer: Medicare PPO | Admitting: Family Medicine

## 2013-04-28 DIAGNOSIS — Z111 Encounter for screening for respiratory tuberculosis: Secondary | ICD-10-CM

## 2013-04-28 LAB — TB SKIN TEST: TB Skin Test: NEGATIVE

## 2013-04-28 NOTE — Telephone Encounter (Signed)
Rx was sent to pharmacy electronically. 

## 2013-06-02 ENCOUNTER — Ambulatory Visit: Payer: Medicare PPO | Admitting: Pharmacist Clinician (PhC)/ Clinical Pharmacy Specialist

## 2013-06-06 ENCOUNTER — Encounter (HOSPITAL_COMMUNITY): Payer: Self-pay

## 2013-06-06 ENCOUNTER — Emergency Department (HOSPITAL_COMMUNITY)
Admission: EM | Admit: 2013-06-06 | Discharge: 2013-06-06 | Disposition: A | Discharge status: Home / Self Care | Payer: Medicare PPO | Attending: Emergency Medicine | Admitting: Emergency Medicine

## 2013-06-06 DIAGNOSIS — I4891 Unspecified atrial fibrillation: Secondary | ICD-10-CM | POA: Insufficient documentation

## 2013-06-06 DIAGNOSIS — Z79899 Other long term (current) drug therapy: Secondary | ICD-10-CM | POA: Insufficient documentation

## 2013-06-06 DIAGNOSIS — E7800 Pure hypercholesterolemia, unspecified: Secondary | ICD-10-CM | POA: Insufficient documentation

## 2013-06-06 DIAGNOSIS — I10 Essential (primary) hypertension: Secondary | ICD-10-CM | POA: Insufficient documentation

## 2013-06-06 DIAGNOSIS — E876 Hypokalemia: Secondary | ICD-10-CM | POA: Insufficient documentation

## 2013-06-06 DIAGNOSIS — Z7901 Long term (current) use of anticoagulants: Secondary | ICD-10-CM | POA: Insufficient documentation

## 2013-06-06 DIAGNOSIS — K219 Gastro-esophageal reflux disease without esophagitis: Secondary | ICD-10-CM | POA: Insufficient documentation

## 2013-06-06 DIAGNOSIS — I480 Paroxysmal atrial fibrillation: Secondary | ICD-10-CM

## 2013-06-06 LAB — BASIC METABOLIC PANEL
BUN: 8 mg/dL (ref 6–23)
Chloride: 102 mEq/L (ref 96–112)
GFR calc Af Amer: 83 mL/min — ABNORMAL LOW (ref >90)
Potassium: 3.4 mEq/L — ABNORMAL LOW (ref 3.5–5.1)

## 2013-06-06 LAB — CBC
HCT: 34.8 % — ABNORMAL LOW (ref 36.0–46.0)
Hemoglobin: 11.4 g/dL — ABNORMAL LOW (ref 12.0–15.0)
WBC: 5.7 10*3/uL (ref 4.0–10.5)

## 2013-06-06 MED ORDER — POTASSIUM CHLORIDE ER 10 MEQ PO TBCR: 10.0000 meq | EXTENDED_RELEASE_TABLET | Freq: Two times a day (BID) | ORAL | Status: DC

## 2013-06-06 MED ORDER — AZITHROMYCIN 250 MG PO TABS: 250.0000 mg | ORAL_TABLET | Freq: Every day | ORAL | Status: DC

## 2013-06-06 NOTE — ED Notes (Signed)
Patient presents from home reporting sudden onset of palpitations that began around 5pm today. Patient denies chest pain but report burning sensation similar to her GERD symptoms. Patient denies SOB, is alert and orientedx3 hx of atrial fib

## 2013-06-06 NOTE — ED Notes (Signed)
Phlebotomy at the bedside  

## 2013-06-06 NOTE — ED Provider Notes (Signed)
CSN: [id number]     Arrival date & time 06/06/13  1806 History   First MD Initiated Contact with Patient 06/06/13 1811     Chief Complaint  Patient presents with  . Palpitations   (Consider location/radiation/quality/duration/timing/severity/associated sxs/prior Treatment) HPI Comments: Pt is a 72 y/o female with hx of afib - usually in nsr - states that she has once weekly episodes of palpitations tht usually last 3-4 minutes - today while eating dinner - she had acute onset of palpitations that lasted 25 minutes and resolved just on arrival to the ER by EMS.  She has no other c/o including no CP, SOB, swelling.  She does note that she has been taking OTC sinus meds and has frequent sinusitis.  At this time she is complaint free.  Patient is a 72 y.o. female presenting with palpitations. The history is provided by the patient, medical records and the EMS personnel.  Palpitations   Past Medical History  Diagnosis Date  . Paroxysmal atrial fibrillation 12/27/2008  . Hypertension   . Hypercholesteremia   . GERD (gastroesophageal reflux disease)   . Palpitations 01/10/2009    14 day monitor- some sinus tachycardia, PVCs   Past Surgical History  Procedure Laterality Date  . Breast surgery    . Fracture surgery    . Abdominal hysterectomy  1980  . Cardiac catheterization  12/16/2010    no evidence of CAD to explain anginal pain w/ positive troponin.  potential etiology is breakthrough AF  . Transthoracic echocardiogram  April 2010    Echo - EF >55; RV mils/mod dilated, RV systolic function mildly reduced; RA mild/moderately dilated; moderate pulmonary hypertension; mild tricuspid regurgitation  . Nm myoview ltd  April 2010    EF 64%, normal pattern of perfusion in all regions, no scintigraphic evidence of inducible ischemia; no significant wall motion abnormalities; EKG negative for ischemia; no significant change from last study; low risk scan   Family History  Problem Relation Age of  Onset  . Arthritis Mother     OSTEO  . Hypertension Mother   . Dementia Mother   . Heart disease Father   . Hypertension Sister   . Dementia Sister   . Cancer - Lung Brother   . Cancer Maternal Grandmother   . Heart disease Maternal Grandfather   . Cancer - Lung Brother   . Hypertension Brother   . Cancer - Prostate Brother    History  Substance Use Topics  . Smoking status: Never Smoker   . Smokeless tobacco: Not on file  . Alcohol Use: No   OB History   Grav Para Term Preterm Abortions TAB SAB Ect Mult Living                 Review of Systems  Cardiovascular: Positive for palpitations.  All other systems reviewed and are negative.    Allergies  Review of patient's allergies indicates no known allergies.  Home Medications   Current Outpatient Rx  Name  Route  Sig  Dispense  Refill  . atorvastatin (LIPITOR) 10 MG tablet   Oral   Take 10 mg by mouth daily with lunch.         . bisacodyl (BISACODYL) 5 MG EC tablet   Oral   Take 5 mg by mouth daily as needed for constipation.         . flecainide (TAMBOCOR) 50 MG tablet   Oral   Take 50 mg by mouth 2 (two) times daily. 12pm  and 12am         . fluticasone (FLONASE) 50 MCG/ACT nasal spray   Nasal   Place 2 sprays into the nose at bedtime as needed (congestion).         Marland Kitchen lisinopril-hydrochlorothiazide (PRINZIDE,ZESTORETIC) 10-12.5 MG per tablet   Oral   Take 1 tablet by mouth daily with lunch.         . metoprolol succinate (TOPROL-XL) 25 MG 24 hr tablet   Oral   Take 25 mg by mouth See admin instructions. Take 1 tablet (25 mg) daily before dinner, may take an additional 1/2 tablet (12.5 mg) 6 hours after initial dose if needed for rapid heart rate or pvc's         . Multiple Vitamin (MULTIVITAMIN WITH MINERALS) TABS tablet   Oral   Take 1 tablet by mouth daily with lunch. Centrum Silver         . omeprazole (PRILOSEC) 20 MG capsule   Oral   Take 20 mg by mouth daily as needed  (indigestion).         . pseudoephedrine-acetaminophen (TYLENOL SINUS) 30-500 MG TABS   Oral   Take 1 tablet by mouth 2 (two) times daily as needed (congestion).         . warfarin (COUMADIN) 5 MG tablet   Oral   Take 2.5-5 mg by mouth daily with lunch. Take 1/2 tablet (2.5 mg) on Monday and Wednesday, take 1 tablet (5 mg) on all other days         . azithromycin (ZITHROMAX Z-PAK) 250 MG tablet   Oral   Take 1 tablet (250 mg total) by mouth daily. 500mg  PO day 1, then 250mg  PO days 205   6 tablet   0   . potassium chloride (K-DUR) 10 MEQ tablet   Oral   Take 1 tablet (10 mEq total) by mouth 2 (two) times daily.   10 tablet   0    BP 124/67  Pulse 88  Temp(Src) 97.8 F (36.6 C) (Oral)  Resp 16  SpO2 100% Physical Exam  Nursing note and vitals reviewed. Constitutional: She appears well-developed and well-nourished. No distress.  HENT:  Head: Normocephalic and atraumatic.  Mouth/Throat: Oropharynx is clear and moist. No oropharyngeal exudate.  Eyes: Conjunctivae and EOM are normal. Pupils are equal, round, and reactive to light. Right eye exhibits no discharge. Left eye exhibits no discharge. No scleral icterus.  Neck: Normal range of motion. Neck supple. No JVD present. No thyromegaly present.  Cardiovascular: Normal rate, regular rhythm, normal heart sounds and intact distal pulses.  Exam reveals no gallop and no friction rub.   No murmur heard. Pulmonary/Chest: Effort normal and breath sounds normal. No respiratory distress. She has no wheezes. She has no rales.  Abdominal: Soft. Bowel sounds are normal. She exhibits no distension and no mass. There is no tenderness.  Musculoskeletal: Normal range of motion. She exhibits no edema and no tenderness.  Lymphadenopathy:    She has no cervical adenopathy.  Neurological: She is alert. Coordination normal.  Skin: Skin is warm and dry. No rash noted. No erythema.  Psychiatric: She has a normal mood and affect. Her behavior  is normal.    ED Course  Procedures (including critical care time) Labs Review Labs Reviewed  BASIC METABOLIC PANEL - Abnormal; Notable for the following:    Potassium 3.4 (*)    Glucose, Bld 102 (*)    GFR calc non Af Amer 71 (*)  GFR calc Af Amer 83 (*)    All other components within normal limits  CBC - Abnormal; Notable for the following:    Hemoglobin 11.4 (*)    HCT 34.8 (*)    MCV 77.5 (*)    MCH 25.4 (*)    All other components within normal limits  PROTIME-INR - Abnormal; Notable for the following:    Prothrombin Time 22.7 (*)    INR 2.08 (*)    All other components within normal limits   Imaging Review No results found.  MDM   1. Paroxysmal atrial fibrillation   2. Hypokalemia    Pt is well appearing, no arrhythmia seen on ECG - she has normal VS other than mild htn, likely caused by OTC stimulant meds in addition to underlying predilection for this rhythm.  She has not missed any of her flecainide or BB.  She appears stable.  Check lytes, h/h - encouraged abstinence from OTC meds - will tx with abx for sinusitis instead.    Short obs period.  Paroxysmal afib.\  ED ECG REPORT  I personally interpreted this EKG   Date: 06/06/2013   Rate: 91  Rhythm: normal sinus rhythm  QRS Axis: normal  Intervals: normal  ST/T Wave abnormalities: normal  Conduction Disutrbances:none  Narrative Interpretation: pvc found  Old EKG Reviewed: unchanged from 02/21/12   Labs show mild hypokalemia - pt otherwise stable, no recurrence of her arrhythmia, home with abx and precautions, pt voices understanding.  Meds given in ED:  Medications - No data to display  New Prescriptions   AZITHROMYCIN (ZITHROMAX Z-PAK) 250 MG TABLET    Take 1 tablet (250 mg total) by mouth daily. 500mg  PO day 1, then 250mg  PO days 205   POTASSIUM CHLORIDE (K-DUR) 10 MEQ TABLET    Take 1 tablet (10 mEq total) by mouth 2 (two) times daily.      Vida Roller, MD 06/06/13 2030

## 2013-06-12 ENCOUNTER — Ambulatory Visit (INDEPENDENT_AMBULATORY_CARE_PROVIDER_SITE_OTHER): Payer: Medicare PPO | Admitting: Internal Medicine

## 2013-06-12 VITALS — BP 132/86 | HR 74 | Temp 97.9°F | Resp 16 | Ht 64.5 in | Wt 149.0 lb

## 2013-06-12 DIAGNOSIS — E876 Hypokalemia: Secondary | ICD-10-CM

## 2013-06-12 DIAGNOSIS — I10 Essential (primary) hypertension: Secondary | ICD-10-CM

## 2013-06-12 MED ORDER — LISINOPRIL 10 MG PO TABS: 10.0000 mg | ORAL_TABLET | Freq: Every day | ORAL | Status: DC

## 2013-06-12 NOTE — Progress Notes (Signed)
Subjective:  This chart was scribed for Donna Sia, MD by Quintella Reichert, ED scribe.  This patient was seen in [facility] Room 9 and the patient's care was started at 3:04 PM.   Patient ID: Donna Simmons, female    DOB: 08-Aug-1941, 72 y.o.   MRN: [id number]  HPI  HPI Comments: Donna Simmons is a 72 y.o. female with h/o paroxysmal atrial fibrillation, HTN and hypercholesteremia who presents for recheck for low potassium levels.  Pt was seen in the ED 6 days ago for an episode of A-fib.  She states that she has once-weekly episodes of palpitations that usually last 3-4 minutes and are generally relieved by Toprol (which she takes daily with an additional 1/2 tablet before dinner during episodes), and by "bearing down and coughing."  Her episode 6 days ago lasted 25 minutes and resolved on arrival to the ER by EMS.  Palpitations were not associated with CP, SOB, leg swelling, or any other associated symptoms.  Her A-fib did not recur while in the ED but her labs revealed hypokalemia with a potassium level of 3.4.  She was also mildly hypertensive at that time.  She was found to be stable for discharge, put on 5d of K+, but advised to f/u for a recheck.  Presently she states "I feel great" and denies any complaints.  Pt is on lisinopril-hydrochlorothiazide and states that she urinates very frequently.  She also has h/o bladder prolapse. She would like to d/c the diuretic. She follows home BPs.  Past Medical History  Diagnosis Date  . Paroxysmal atrial fibrillation 12/27/2008  . Hypertension   . Hypercholesteremia   . GERD (gastroesophageal reflux disease)   . Palpitations 01/10/2009    14 day monitor- some sinus tachycardia, PVCs      Medication List       This list is accurate as of: 06/12/13  3:03 PM.  Always use your most recent med list.               atorvastatin 10 MG tablet  Commonly known as:  LIPITOR  Take 10 mg by mouth daily with lunch.     azithromycin 250 MG tablet   Commonly known as:  ZITHROMAX Z-PAK  Take 1 tablet (250 mg total) by mouth daily. 500mg  PO day 1, then 250mg  PO days 205     bisacodyl 5 MG EC tablet  Generic drug:  bisacodyl  Take 5 mg by mouth daily as needed for constipation.     flecainide 50 MG tablet  Commonly known as:  TAMBOCOR  Take 50 mg by mouth 2 (two) times daily. 12pm and 12am     fluticasone 50 MCG/ACT nasal spray  Commonly known as:  FLONASE  Place 2 sprays into the nose at bedtime as needed (congestion).     lisinopril-hydrochlorothiazide 10-12.5 MG per tablet  Commonly known as:  PRINZIDE,ZESTORETIC  Take 1 tablet by mouth daily with lunch.     metoprolol succinate 25 MG 24 hr tablet  Commonly known as:  TOPROL-XL  Take 25 mg by mouth See admin instructions. Take 1 tablet (25 mg) daily before dinner, may take an additional 1/2 tablet (12.5 mg) 6 hours after initial dose if needed for rapid heart rate or pvc's     multivitamin with minerals Tabs tablet  Take 1 tablet by mouth daily with lunch. Centrum Silver     omeprazole 20 MG capsule  Commonly known as:  PRILOSEC  Take 20 mg by mouth  daily as needed (indigestion).     potassium chloride 10 MEQ tablet  Commonly known as:  K-DUR  Take 1 tablet (10 mEq total) by mouth 2 (two) times daily.ended  p 5d     pseudoephedrine-acetaminophen 30-500 MG Tabs  Commonly known as:  TYLENOL SINUS  Take 1 tablet by mouth 2 (two) times daily as needed (congestion).     warfarin 5 MG tablet  Commonly known as:  COUMADIN  Take 2.5-5 mg by mouth daily with lunch. Take 1/2 tablet (2.5 mg) on Monday and Wednesday, take 1 tablet (5 mg) on all other days        Past Surgical History  Procedure Laterality Date  . Breast surgery    . Fracture surgery    . Abdominal hysterectomy  1980  . Cardiac catheterization  12/16/2010    no evidence of CAD to explain anginal pain w/ positive troponin.  potential etiology is breakthrough AF  . Transthoracic echocardiogram  April 2010     Echo - EF >55; RV mils/mod dilated, RV systolic function mildly reduced; RA mild/moderately dilated; moderate pulmonary hypertension; mild tricuspid regurgitation  . Nm myoview ltd  April 2010    EF 64%, normal pattern of perfusion in all regions, no scintigraphic evidence of inducible ischemia; no significant wall motion abnormalities; EKG negative for ischemia; no significant change from last study; low risk scan       Review of Systems  Constitutional: Negative for fever, appetite change, fatigue and unexpected weight change.  Respiratory: Negative for shortness of breath.   Cardiovascular: Negative for chest pain, palpitations and leg swelling.  Genitourinary: Negative for difficulty urinating.       No nocturia       Objective:   Physical Exam  Nursing note and vitals reviewed. Constitutional: She is oriented to person, place, and time. She appears well-developed and well-nourished. No distress.  Eyes: Conjunctivae and EOM are normal. Pupils are equal, round, and reactive to light.  Cardiovascular: Normal rate and regular rhythm.   Pulmonary/Chest: Effort normal. No respiratory distress.  Neurological: She is alert and oriented to person, place, and time.  Psychiatric: She has a normal mood and affect. Her behavior is normal.   BP 132/86  Pulse 74  Temp(Src) 97.9 F (36.6 C) (Oral)  Resp 16  Ht 5' 4.5" (1.638 m)  Wt 149 lb (67.586 kg)  BMI 25.19 kg/m2  SpO2 98   Assessment & Plan:     Visit Diagnoses   Hypokalemia    -  Primary    Relevant Orders       Basic metabolic panel    Hypertensive disease        Relevant Medications       lisinopril (PRINIVIL,ZESTRIL) tablet.  Discontinue diuretic portion of this med.  Home blood pressures and f/u as planned.          Atrial fibrillation - controled    I have completed the patient encounter in its entirety as documented by the scribe, with editing by me where necessary. Macon Lesesne P. Merla Riches, M.D.

## 2013-06-13 LAB — BASIC METABOLIC PANEL
BUN: 15 mg/dL (ref 6–23)
CO2: 28 mEq/L (ref 19–32)
Glucose, Bld: 87 mg/dL (ref 70–99)
Potassium: 4.5 mEq/L (ref 3.5–5.3)
Sodium: 140 mEq/L (ref 135–145)

## 2013-06-14 ENCOUNTER — Ambulatory Visit (INDEPENDENT_AMBULATORY_CARE_PROVIDER_SITE_OTHER): Payer: Medicare PPO | Admitting: Pharmacist Clinician (PhC)/ Clinical Pharmacy Specialist

## 2013-06-14 VITALS — BP 120/70 | HR 72

## 2013-06-14 DIAGNOSIS — I4891 Unspecified atrial fibrillation: Secondary | ICD-10-CM

## 2013-06-14 DIAGNOSIS — I480 Paroxysmal atrial fibrillation: Secondary | ICD-10-CM

## 2013-06-14 DIAGNOSIS — Z7901 Long term (current) use of anticoagulants: Secondary | ICD-10-CM

## 2013-06-17 ENCOUNTER — Ambulatory Visit (INDEPENDENT_AMBULATORY_CARE_PROVIDER_SITE_OTHER): Payer: Medicare PPO | Admitting: Family Medicine

## 2013-06-17 VITALS — BP 122/72 | HR 81 | Temp 98.8°F | Resp 17 | Ht 64.0 in | Wt 151.0 lb

## 2013-06-17 DIAGNOSIS — K089 Disorder of teeth and supporting structures, unspecified: Secondary | ICD-10-CM

## 2013-06-17 DIAGNOSIS — K0889 Other specified disorders of teeth and supporting structures: Secondary | ICD-10-CM

## 2013-06-17 MED ORDER — PENICILLIN V POTASSIUM 500 MG PO TABS: 500.0000 mg | ORAL_TABLET | Freq: Two times a day (BID) | ORAL | Status: DC

## 2013-06-17 MED ORDER — HYDROCODONE-ACETAMINOPHEN 5-325 MG PO TABS: ORAL_TABLET | ORAL | Status: DC

## 2013-06-17 NOTE — Patient Instructions (Signed)
Use the hydrocodone pain medication as needed for pain.  Remember it can make you sleepy. This medication has tylenol in it, so no additional tylenol at the same time.  Limit your tylenol intake to 2,000 mg a day.    If your tooth continues to bother you, also fill and take the penicillin.  However at this time I do not think the tooth is infected.

## 2013-06-17 NOTE — Progress Notes (Signed)
[facility] [address] [place] [address] [phone]  Date:  06/17/2013   Name:  Donna Simmons   DOB:  [birth year]   MRN:  [id number]  PCP:  Dow Adolph, MD    Chief Complaint: Dental Injury   History of Present Illness:  Donna Simmons is a 72 y.o. very pleasant female patient who presents with the following:  She broke a right mandibular tooth this morning.  It is sore and she is concerned it could be infected.  They had planned to pull this tooth anyway, but it broke before this will be done.   She will see her dentist next week  She does have a.fib and uses coumadin.  Her cardiologist is Dr. Herbie Baltimore with [facility].  Right now she feels that she is in sinus rhythm.  Due to her coumadin she does not use NSAIDS.  OTC tylenol is not controlling her pain and she does not feel she can make it through the weekend without something for relief.    She did take azithromycin for sinusitis about 10 days ago.   No fever or other systemic sx/    Patient Active Problem List   Diagnosis Date Noted  . Long term (current) use of anticoagulants 11/18/2012  . Hypercholesteremia 09/25/2012  . DDD (degenerative disc disease), cervical 05/19/2012  . Paroxysmal atrial fibrillation 04/20/2012  . HTN (hypertension) 04/20/2012    Past Medical History  Diagnosis Date  . Paroxysmal atrial fibrillation 12/27/2008  . Hypertension   . Hypercholesteremia   . GERD (gastroesophageal reflux disease)   . Palpitations 01/10/2009    14 day monitor- some sinus tachycardia, PVCs    Past Surgical History  Procedure Laterality Date  . Breast surgery    . Fracture surgery    . Abdominal hysterectomy  1980  . Cardiac catheterization  12/16/2010    no evidence of CAD to explain anginal pain w/ positive troponin.  potential etiology is breakthrough AF  . Transthoracic echocardiogram  April 2010    Echo - EF >55; RV mils/mod dilated, RV systolic function mildly reduced; RA  mild/moderately dilated; moderate pulmonary hypertension; mild tricuspid regurgitation  . Nm myoview ltd  April 2010    EF 64%, normal pattern of perfusion in all regions, no scintigraphic evidence of inducible ischemia; no significant wall motion abnormalities; EKG negative for ischemia; no significant change from last study; low risk scan    History  Substance Use Topics  . Smoking status: Never Smoker   . Smokeless tobacco: Not on file  . Alcohol Use: No    Family History  Problem Relation Age of Onset  . Arthritis Mother     OSTEO  . Hypertension Mother   . Dementia Mother   . Heart disease Father   . Hypertension Sister   . Dementia Sister   . Cancer - Lung Brother   . Cancer Maternal Grandmother   . Heart disease Maternal Grandfather   . Cancer - Lung Brother   . Hypertension Brother   . Cancer - Prostate Brother     No Known Allergies  Medication list has been reviewed and updated.  Current Outpatient Prescriptions on File Prior to Visit  Medication Sig Dispense Refill  . atorvastatin (LIPITOR) 10 MG tablet Take 10 mg by mouth daily with lunch.      . bisacodyl (BISACODYL) 5 MG EC tablet Take 5 mg by mouth daily as needed for constipation.      Marland Kitchen  flecainide (TAMBOCOR) 50 MG tablet Take 50 mg by mouth 2 (two) times daily. 12pm and 12am      . fluticasone (FLONASE) 50 MCG/ACT nasal spray Place 2 sprays into the nose at bedtime as needed (congestion).      Marland Kitchen lisinopril (PRINIVIL,ZESTRIL) 10 MG tablet Take 1 tablet (10 mg total) by mouth daily.  90 tablet  3  . metoprolol succinate (TOPROL-XL) 25 MG 24 hr tablet Take 25 mg by mouth See admin instructions. Take 1 tablet (25 mg) daily before dinner, may take an additional 1/2 tablet (12.5 mg) 6 hours after initial dose if needed for rapid heart rate or pvc's      . Multiple Vitamin (MULTIVITAMIN WITH MINERALS) TABS tablet Take 1 tablet by mouth daily with lunch. Centrum Silver      . omeprazole (PRILOSEC) 20 MG capsule Take  20 mg by mouth daily as needed (indigestion).      . warfarin (COUMADIN) 5 MG tablet Take 2.5-5 mg by mouth daily with lunch. Take 1/2 tablet (2.5 mg) on Monday and Wednesday, take 1 tablet (5 mg) on all other days      . azithromycin (ZITHROMAX Z-PAK) 250 MG tablet Take 1 tablet (250 mg total) by mouth daily. 500mg  PO day 1, then 250mg  PO days 205  6 tablet  0  . potassium chloride (K-DUR) 10 MEQ tablet Take 1 tablet (10 mEq total) by mouth 2 (two) times daily.  10 tablet  0  . pseudoephedrine-acetaminophen (TYLENOL SINUS) 30-500 MG TABS Take 1 tablet by mouth 2 (two) times daily as needed (congestion).       No current facility-administered medications on file prior to visit.    Review of Systems:  As per HPI- otherwise negative.   Physical Examination: Filed Vitals:   06/17/13 1816  BP: 122/72  Pulse: 81  Temp: 98.8 F (37.1 C)  Resp: 17   Filed Vitals:   06/17/13 1816  Height: 5\' 4"  (1.626 m)  Weight: 151 lb (68.493 kg)   Body mass index is 25.91 kg/(m^2). Ideal Body Weight: Weight in (lb) to have BMI = 25: 145.3  GEN: WDWN, NAD, Non-toxic, A & O x 3, looks well HEENT: Atraumatic, Normocephalic. Neck supple. No masses, No LAD.  TM wnl, PEERL.  Very poor dentition.   Ears and Nose: No external deformity. CV: RRR, No M/G/R. No JVD. No thrill. No extra heart sounds.  At this time seems to be in sinus rhythm  PULM: CTA B, no wheezes, crackles, rhonchi. No retractions. No resp. distress. No accessory muscle use. EXTR: No c/c/e NEURO Normal gait.  PSYCH: Normally interactive. Conversant. Not depressed or anxious appearing.  Calm demeanor.  Tooth # 27 appears to be broken and is quite tender to the touch, no evidence of acute infection or abscess however.  No redness, swelling or pus.    Assessment and Plan: Tooth pain - Plan: penicillin v potassium (VEETID) 500 MG tablet, HYDROcodone-acetaminophen (NORCO/VICODIN) 5-325 MG per tablet  Dental pain not controlled with tylenol  and not able to take NSAIDs due to coumadin use.  Explained that narcotic pain medications can be sedating, and need to be used with caution.  She will start with a 1/2 tab as this may be sufficient.   Hold penicillin and fill if not better.    Signed Abbe Amsterdam, MD

## 2013-07-21 ENCOUNTER — Encounter (HOSPITAL_COMMUNITY): Payer: Self-pay | Admitting: Emergency Medicine

## 2013-07-21 ENCOUNTER — Ambulatory Visit (INDEPENDENT_AMBULATORY_CARE_PROVIDER_SITE_OTHER): Payer: Medicare PPO

## 2013-07-21 ENCOUNTER — Emergency Department (HOSPITAL_COMMUNITY)
Admission: EM | Admit: 2013-07-21 | Discharge: 2013-07-22 | Disposition: A | Discharge status: Home / Self Care | Payer: Medicare PPO | Attending: Emergency Medicine | Admitting: Emergency Medicine

## 2013-07-21 DIAGNOSIS — Z79899 Other long term (current) drug therapy: Secondary | ICD-10-CM | POA: Insufficient documentation

## 2013-07-21 DIAGNOSIS — Z7901 Long term (current) use of anticoagulants: Secondary | ICD-10-CM | POA: Insufficient documentation

## 2013-07-21 DIAGNOSIS — Z23 Encounter for immunization: Secondary | ICD-10-CM

## 2013-07-21 DIAGNOSIS — IMO0002 Reserved for concepts with insufficient information to code with codable children: Secondary | ICD-10-CM | POA: Insufficient documentation

## 2013-07-21 DIAGNOSIS — I493 Ventricular premature depolarization: Secondary | ICD-10-CM

## 2013-07-21 DIAGNOSIS — I4891 Unspecified atrial fibrillation: Secondary | ICD-10-CM | POA: Insufficient documentation

## 2013-07-21 DIAGNOSIS — K219 Gastro-esophageal reflux disease without esophagitis: Secondary | ICD-10-CM | POA: Insufficient documentation

## 2013-07-21 DIAGNOSIS — I4949 Other premature depolarization: Secondary | ICD-10-CM | POA: Insufficient documentation

## 2013-07-21 DIAGNOSIS — I10 Essential (primary) hypertension: Secondary | ICD-10-CM | POA: Insufficient documentation

## 2013-07-21 DIAGNOSIS — E7800 Pure hypercholesterolemia, unspecified: Secondary | ICD-10-CM | POA: Insufficient documentation

## 2013-07-21 LAB — CBC
Hemoglobin: 12.1 g/dL (ref 12.0–15.0)
MCH: 26.1 pg (ref 26.0–34.0)
MCHC: 33 g/dL (ref 30.0–36.0)
MCV: 79.3 fL (ref 78.0–100.0)
Platelets: 207 10*3/uL (ref 150–400)
RBC: 4.63 MIL/uL (ref 3.87–5.11)
RDW: 14.9 % (ref 11.5–15.5)

## 2013-07-21 LAB — BASIC METABOLIC PANEL
BUN: 10 mg/dL (ref 6–23)
CO2: 29 mEq/L (ref 19–32)
Calcium: 9.3 mg/dL (ref 8.4–10.5)
Creatinine, Ser: 0.85 mg/dL (ref 0.50–1.10)
GFR calc Af Amer: 79 mL/min — ABNORMAL LOW (ref >90)
GFR calc non Af Amer: 68 mL/min — ABNORMAL LOW (ref >90)
Glucose, Bld: 82 mg/dL (ref 70–99)
Potassium: 3.3 mEq/L — ABNORMAL LOW (ref 3.5–5.1)

## 2013-07-21 MED ORDER — POTASSIUM CHLORIDE CRYS ER 20 MEQ PO TBCR
40.0000 meq | EXTENDED_RELEASE_TABLET | Freq: Once | ORAL | Status: AC
  Administered 2013-07-22: 40 meq via ORAL
  Filled 2013-07-21: qty 2

## 2013-07-21 NOTE — ED Provider Notes (Addendum)
CSN: [id number]     Arrival date & time 07/21/13  2122 History   First MD Initiated Contact with Patient 07/21/13 2147     Chief Complaint  Patient presents with  . Irregular Heart Beat    HPI Patient reports feeling an occasional skipped beat this evening.  She denies syncope.  No orthopnea.  No exertional shortness of breath.  She states it does not go fast his chest occasional skipped an abnormal beat.  She is on Coumadin for history of paroxysmal atrial fibrillation.  Her symptoms are mild.  No other complaints.  Past Medical History  Diagnosis Date  . Paroxysmal atrial fibrillation 12/27/2008  . Hypertension   . Hypercholesteremia   . GERD (gastroesophageal reflux disease)   . Palpitations 01/10/2009    14 day monitor- some sinus tachycardia, PVCs   Past Surgical History  Procedure Laterality Date  . Breast surgery    . Fracture surgery    . Abdominal hysterectomy  1980  . Cardiac catheterization  12/16/2010    no evidence of CAD to explain anginal pain w/ positive troponin.  potential etiology is breakthrough AF  . Transthoracic echocardiogram  April 2010    Echo - EF >55; RV mils/mod dilated, RV systolic function mildly reduced; RA mild/moderately dilated; moderate pulmonary hypertension; mild tricuspid regurgitation  . Nm myoview ltd  April 2010    EF 64%, normal pattern of perfusion in all regions, no scintigraphic evidence of inducible ischemia; no significant wall motion abnormalities; EKG negative for ischemia; no significant change from last study; low risk scan   Family History  Problem Relation Age of Onset  . Arthritis Mother     OSTEO  . Hypertension Mother   . Dementia Mother   . Heart disease Father   . Hypertension Sister   . Dementia Sister   . Cancer - Lung Brother   . Cancer Maternal Grandmother   . Heart disease Maternal Grandfather   . Cancer - Lung Brother   . Hypertension Brother   . Cancer - Prostate Brother    History  Substance Use Topics   . Smoking status: Never Smoker   . Smokeless tobacco: Not on file  . Alcohol Use: No   OB History   Grav Para Term Preterm Abortions TAB SAB Ect Mult Living                 Review of Systems  All other systems reviewed and are negative.    Allergies  Review of patient's allergies indicates no known allergies.  Home Medications   Current Outpatient Rx  Name  Route  Sig  Dispense  Refill  . atorvastatin (LIPITOR) 10 MG tablet   Oral   Take 10 mg by mouth daily with lunch.         . flecainide (TAMBOCOR) 50 MG tablet   Oral   Take 50 mg by mouth 2 (two) times daily. 12pm and 12am         . fluticasone (FLONASE) 50 MCG/ACT nasal spray   Nasal   Place 2 sprays into the nose at bedtime as needed (congestion).         Marland Kitchen lisinopril (PRINIVIL,ZESTRIL) 10 MG tablet   Oral   Take 1 tablet (10 mg total) by mouth daily.   90 tablet   3   . metoprolol succinate (TOPROL-XL) 25 MG 24 hr tablet   Oral   Take 25 mg by mouth See admin instructions. Take 1 tablet (25  mg) daily before dinner, may take an additional 1/2 tablet (12.5 mg) 6 hours after initial dose if needed for rapid heart rate or pvc's         . Multiple Vitamin (MULTIVITAMIN WITH MINERALS) TABS tablet   Oral   Take 1 tablet by mouth daily with lunch. Centrum Silver         . omeprazole (PRILOSEC) 20 MG capsule   Oral   Take 20 mg by mouth daily as needed (indigestion).         . pseudoephedrine-acetaminophen (TYLENOL SINUS) 30-500 MG TABS   Oral   Take 1 tablet by mouth 2 (two) times daily as needed (congestion).         . warfarin (COUMADIN) 5 MG tablet   Oral   Take 2.5-5 mg by mouth daily with lunch. Take 1/2 tablet (2.5 mg) on Monday and Wednesday, take 1 tablet (5 mg) on all other days          BP 140/71  Pulse 81  Temp(Src) 97.9 F (36.6 C) (Oral)  Resp 17  SpO2 97% Physical Exam  Nursing note and vitals reviewed. Constitutional: She is oriented to person, place, and time. She  appears well-developed and well-nourished. No distress.  HENT:  Head: Normocephalic and atraumatic.  Eyes: EOM are normal.  Neck: Normal range of motion.  Cardiovascular: Normal rate, regular rhythm and normal heart sounds.   Pulmonary/Chest: Effort normal and breath sounds normal.  Abdominal: Soft. She exhibits no distension. There is no tenderness.  Musculoskeletal: Normal range of motion.  Neurological: She is alert and oriented to person, place, and time.  Skin: Skin is warm and dry.  Psychiatric: She has a normal mood and affect. Judgment normal.    ED Course  Procedures (including critical care time) Labs Review Labs Reviewed  BASIC METABOLIC PANEL - Abnormal; Notable for the following:    Potassium 3.3 (*)    GFR calc non Af Amer 68 (*)    GFR calc Af Amer 79 (*)    All other components within normal limits  CBC  TROPONIN I   Imaging Review No results found.  ECG interpretation  Date: 07/21/2013  Rate: 90  Rhythm: normal sinus rhythm  QRS Axis: normal  Intervals: normal  ST/T Wave abnormalities: normal  Conduction Disutrbances: none  Narrative Interpretation: Multiple PVCs  Old EKG Reviewed: No significant changes noted     MDM   1. Frequent PVCs   2. Hypokalemia  Multiple PVCs.  No other symptoms.  Discharge home with PCP followup.  Potassium replaced.  While in the emergency department the patient was placed on telemetry.  It seems that her frequent PVCs have resolved    Lyanne Co, MD 07/21/13 2354  Lyanne Co, MD 08/04/13 [phone]

## 2013-07-21 NOTE — ED Notes (Signed)
Per EMS pt here because she could feel her hear "miss a beat". NSR on monitor with frequent PVCs. Pt has had hx of the same since 2003 and had an episode of the same 2 months ago due to hypokalemia.

## 2013-07-22 ENCOUNTER — Encounter (HOSPITAL_COMMUNITY): Payer: Self-pay | Admitting: Emergency Medicine

## 2013-07-22 ENCOUNTER — Observation Stay (HOSPITAL_COMMUNITY)
Admission: EM | Admit: 2013-07-22 | Discharge: 2013-07-23 | Disposition: A | Discharge status: Home / Self Care | Payer: Medicare PPO | Attending: Family Medicine | Admitting: Family Medicine

## 2013-07-22 ENCOUNTER — Emergency Department (HOSPITAL_COMMUNITY): Payer: Medicare PPO

## 2013-07-22 DIAGNOSIS — Z9889 Other specified postprocedural states: Secondary | ICD-10-CM | POA: Insufficient documentation

## 2013-07-22 DIAGNOSIS — E7800 Pure hypercholesterolemia, unspecified: Secondary | ICD-10-CM | POA: Insufficient documentation

## 2013-07-22 DIAGNOSIS — R002 Palpitations: Secondary | ICD-10-CM | POA: Insufficient documentation

## 2013-07-22 DIAGNOSIS — I10 Essential (primary) hypertension: Secondary | ICD-10-CM | POA: Insufficient documentation

## 2013-07-22 DIAGNOSIS — K219 Gastro-esophageal reflux disease without esophagitis: Secondary | ICD-10-CM | POA: Insufficient documentation

## 2013-07-22 DIAGNOSIS — R079 Chest pain, unspecified: Principal | ICD-10-CM | POA: Insufficient documentation

## 2013-07-22 LAB — URINALYSIS, ROUTINE W REFLEX MICROSCOPIC
Bilirubin Urine: NEGATIVE
Ketones, ur: NEGATIVE mg/dL
Nitrite: NEGATIVE
Specific Gravity, Urine: 1.004 — ABNORMAL LOW (ref 1.005–1.030)
pH: 5.5 (ref 5.0–8.0)

## 2013-07-22 LAB — CBC
HCT: 38.3 % (ref 36.0–46.0)
MCH: 25.8 pg — ABNORMAL LOW (ref 26.0–34.0)
MCHC: 32.9 g/dL (ref 30.0–36.0)
MCV: 78.5 fL (ref 78.0–100.0)
Platelets: 218 10*3/uL (ref 150–400)
RDW: 15.1 % (ref 11.5–15.5)
WBC: 3.7 10*3/uL — ABNORMAL LOW (ref 4.0–10.5)

## 2013-07-22 LAB — BASIC METABOLIC PANEL
BUN: 7 mg/dL (ref 6–23)
CO2: 26 mEq/L (ref 19–32)
Calcium: 9.6 mg/dL (ref 8.4–10.5)
Chloride: 101 mEq/L (ref 96–112)
Creatinine, Ser: 0.92 mg/dL (ref 0.50–1.10)
GFR calc Af Amer: 71 mL/min — ABNORMAL LOW (ref >90)
Potassium: 3.7 mEq/L (ref 3.5–5.1)
Sodium: 138 mEq/L (ref 135–145)

## 2013-07-22 LAB — URINE MICROSCOPIC-ADD ON

## 2013-07-22 MED ORDER — ASPIRIN EC 81 MG PO TBEC
81.0000 mg | DELAYED_RELEASE_TABLET | Freq: Every day | ORAL | Status: DC
  Administered 2013-07-23: 81 mg via ORAL
  Filled 2013-07-22: qty 1

## 2013-07-22 MED ORDER — ONDANSETRON HCL 4 MG/2ML IJ SOLN: 4.0000 mg | Freq: Four times a day (QID) | INTRAMUSCULAR | Status: DC | PRN

## 2013-07-22 MED ORDER — NITROGLYCERIN 0.4 MG SL SUBL: 0.4000 mg | SUBLINGUAL_TABLET | SUBLINGUAL | Status: DC | PRN

## 2013-07-22 MED ORDER — ASPIRIN 325 MG PO TABS
325.0000 mg | ORAL_TABLET | Freq: Once | ORAL | Status: AC
  Administered 2013-07-22: 325 mg via ORAL
  Filled 2013-07-22: qty 1

## 2013-07-22 MED ORDER — PANTOPRAZOLE SODIUM 40 MG PO TBEC
40.0000 mg | DELAYED_RELEASE_TABLET | Freq: Every day | ORAL | Status: DC
  Administered 2013-07-23: 40 mg via ORAL
  Filled 2013-07-22: qty 1

## 2013-07-22 MED ORDER — SODIUM CHLORIDE 0.9 % IJ SOLN
3.0000 mL | Freq: Two times a day (BID) | INTRAMUSCULAR | Status: DC
  Administered 2013-07-23 (×2): 3 mL via INTRAVENOUS

## 2013-07-22 MED ORDER — ADULT MULTIVITAMIN W/MINERALS CH
1.0000 | ORAL_TABLET | Freq: Every day | ORAL | Status: DC
  Administered 2013-07-23: 1 via ORAL
  Filled 2013-07-22: qty 1

## 2013-07-22 MED ORDER — HEPARIN SODIUM (PORCINE) 5000 UNIT/ML IJ SOLN
5000.0000 [IU] | Freq: Three times a day (TID) | INTRAMUSCULAR | Status: DC
  Filled 2013-07-22: qty 1

## 2013-07-22 MED ORDER — ATORVASTATIN CALCIUM 10 MG PO TABS
10.0000 mg | ORAL_TABLET | Freq: Every day | ORAL | Status: DC
  Administered 2013-07-23: 10 mg via ORAL
  Filled 2013-07-22: qty 1

## 2013-07-22 MED ORDER — ACETAMINOPHEN 325 MG PO TABS: 650.0000 mg | ORAL_TABLET | ORAL | Status: DC | PRN

## 2013-07-22 MED ORDER — SODIUM CHLORIDE 0.9 % IJ SOLN: 3.0000 mL | INTRAMUSCULAR | Status: DC | PRN

## 2013-07-22 MED ORDER — LISINOPRIL 10 MG PO TABS
10.0000 mg | ORAL_TABLET | Freq: Every day | ORAL | Status: DC
  Administered 2013-07-23: 10 mg via ORAL
  Filled 2013-07-22: qty 1

## 2013-07-22 MED ORDER — METOPROLOL SUCCINATE ER 25 MG PO TB24
25.0000 mg | ORAL_TABLET | Freq: Every day | ORAL | Status: DC
  Filled 2013-07-22: qty 1

## 2013-07-22 MED ORDER — FLUTICASONE PROPIONATE 50 MCG/ACT NA SUSP
2.0000 | Freq: Every evening | NASAL | Status: DC | PRN
  Filled 2013-07-22: qty 16

## 2013-07-22 MED ORDER — SODIUM CHLORIDE 0.9 % IV SOLN: 250.0000 mL | INTRAVENOUS | Status: DC | PRN

## 2013-07-22 MED ORDER — FLECAINIDE ACETATE 50 MG PO TABS
50.0000 mg | ORAL_TABLET | Freq: Two times a day (BID) | ORAL | Status: DC
  Administered 2013-07-23 (×2): 50 mg via ORAL
  Filled 2013-07-22 (×3): qty 1

## 2013-07-22 NOTE — ED Provider Notes (Signed)
CSN: [id number]     Arrival date & time 07/22/13  1813 History   First MD Initiated Contact with Patient 07/22/13 1901     Chief Complaint  Patient presents with  . Chest Pain  . Irregular Heart Beat   (Consider location/radiation/quality/duration/timing/severity/associated sxs/prior Treatment) HPI CHARNETTA WULFF is a 72 y.o. female who presents to the emergency department with atrial fibrillation.  Patient reports that over last 6 hours she was feeling extra beats in her heart.  Then she developed a pressure sensation over her chest.  Moderate in severity.  Started while she was cleaning house.  Relieved with rest.  No leg swelling.  Not described as ripping or tearing.  Not pleuritic.  Pain lasted approximately 30 minutes before resolving.  Now pain free.  No longer feeling palpitations.  No other symptoms.  Past Medical History  Diagnosis Date  . Paroxysmal atrial fibrillation 12/27/2008  . Hypertension   . Hypercholesteremia   . GERD (gastroesophageal reflux disease)   . Palpitations 01/10/2009    14 day monitor- some sinus tachycardia, PVCs   Past Surgical History  Procedure Laterality Date  . Breast surgery    . Fracture surgery    . Abdominal hysterectomy  1980  . Cardiac catheterization  12/16/2010    no evidence of CAD to explain anginal pain w/ positive troponin.  potential etiology is breakthrough AF  . Transthoracic echocardiogram  April 2010    Echo - EF >55; RV mils/mod dilated, RV systolic function mildly reduced; RA mild/moderately dilated; moderate pulmonary hypertension; mild tricuspid regurgitation  . Nm myoview ltd  April 2010    EF 64%, normal pattern of perfusion in all regions, no scintigraphic evidence of inducible ischemia; no significant wall motion abnormalities; EKG negative for ischemia; no significant change from last study; low risk scan   Family History  Problem Relation Age of Onset  . Arthritis Mother     OSTEO  . Hypertension Mother   . Dementia  Mother   . Heart disease Father   . Hypertension Sister   . Dementia Sister   . Cancer - Lung Brother   . Cancer Maternal Grandmother   . Heart disease Maternal Grandfather   . Cancer - Lung Brother   . Hypertension Brother   . Cancer - Prostate Brother    History  Substance Use Topics  . Smoking status: Never Smoker   . Smokeless tobacco: Not on file  . Alcohol Use: No   OB History   Grav Para Term Preterm Abortions TAB SAB Ect Mult Living                 Review of Systems  Constitutional: Negative for fever and chills.  HENT: Negative for congestion and rhinorrhea.   Respiratory: Negative for cough and shortness of breath.   Cardiovascular: Positive for chest pain and palpitations.  Gastrointestinal: Negative for nausea, vomiting, abdominal pain, diarrhea and abdominal distention.  Endocrine: Negative for polyuria.  Genitourinary: Negative for dysuria.  Musculoskeletal: Negative for neck pain and neck stiffness.  Skin: Negative for rash.  Neurological: Negative for headaches.  Psychiatric/Behavioral: Negative.     Allergies  Review of patient's allergies indicates no known allergies.  Home Medications   Current Outpatient Rx  Name  Route  Sig  Dispense  Refill  . atorvastatin (LIPITOR) 10 MG tablet   Oral   Take 10 mg by mouth daily with lunch.         . flecainide (TAMBOCOR) 50  MG tablet   Oral   Take 50 mg by mouth 2 (two) times daily. 12pm and 12am         . fluticasone (FLONASE) 50 MCG/ACT nasal spray   Nasal   Place 2 sprays into the nose at bedtime as needed (congestion).         Marland Kitchen lisinopril (PRINIVIL,ZESTRIL) 10 MG tablet   Oral   Take 1 tablet (10 mg total) by mouth daily.   90 tablet   3   . metoprolol succinate (TOPROL-XL) 25 MG 24 hr tablet   Oral   Take 25 mg by mouth See admin instructions. Take 1 tablet (25 mg) daily before dinner, may take an additional 1/2 tablet (12.5 mg) 6 hours after initial dose if needed for rapid heart rate  or pvc's         . Multiple Vitamin (MULTIVITAMIN WITH MINERALS) TABS tablet   Oral   Take 1 tablet by mouth daily with lunch. Centrum Silver         . omeprazole (PRILOSEC) 20 MG capsule   Oral   Take 20 mg by mouth daily as needed (indigestion).         . pseudoephedrine-acetaminophen (TYLENOL SINUS) 30-500 MG TABS   Oral   Take 1 tablet by mouth 2 (two) times daily as needed (congestion).         . warfarin (COUMADIN) 5 MG tablet   Oral   Take 2.5-5 mg by mouth daily with lunch. Take 1/2 tablet (2.5 mg) on Monday and Wednesday and Friday, take 1 tablet (5 mg) on all other days          BP 140/71  Pulse 97  Resp 16  SpO2 100% Physical Exam  Nursing note and vitals reviewed. Constitutional: She is oriented to person, place, and time. She appears well-developed and well-nourished. No distress.  HENT:  Head: Normocephalic and atraumatic.  Right Ear: External ear normal.  Left Ear: External ear normal.  Nose: Nose normal.  Mouth/Throat: Oropharynx is clear and moist. No oropharyngeal exudate.  Eyes: EOM are normal. Pupils are equal, round, and reactive to light.  Neck: Normal range of motion. Neck supple. No tracheal deviation present.  Cardiovascular: Normal rate.   Pulmonary/Chest: Effort normal and breath sounds normal. No stridor. No respiratory distress. She has no wheezes. She has no rales.  Abdominal: Soft. She exhibits no distension. There is no tenderness. There is no rebound.  Musculoskeletal: Normal range of motion.  Neurological: She is alert and oriented to person, place, and time.  Skin: Skin is warm and dry. She is not diaphoretic.    ED Course  Procedures (including critical care time) Labs Review Labs Reviewed  CBC  BASIC METABOLIC PANEL  PRO B NATRIURETIC PEPTIDE  POCT I-STAT TROPONIN I   Imaging Review No results found.  EKG Interpretation    Date/Time:  Friday July 22 2013 18:15:56 EST Ventricular Rate:  96 PR  Interval:  152 QRS Duration: 88 QT Interval:  376 QTC Calculation: 475 R Axis:   -4 Text Interpretation:  Sinus rhythm with occasional Premature ventricular complexes and Possible Premature atrial complexes with Abberant conduction Otherwise normal ECG No significant change since last tracing Confirmed by SHELDON  MD, CHARLES ([id number]) on 07/22/2013 6:21:13 PM            MDM   1. Chest pain     KODIE PICK is a 72 y.o. female with history of atrial fibrillation who presented to  the emergency department for concern of CP/palpitations.  Patient now chest pain free.  Atypical story for dissection/PE.  Workup initiated with first troponin negative.  Will admit to family practice service for further rule out and monitoring.    Arloa Koh, MD 07/23/13 [phone]

## 2013-07-22 NOTE — Progress Notes (Signed)
ANTICOAGULATION CONSULT NOTE - Initial Consult  Pharmacy Consult for Coumadin Indication: atrial fibrillation  No Known Allergies   Vital Signs: Temp: 98 F (36.7 C) (11/21 2221) Temp src: Oral (11/21 2221) BP: 119/78 mmHg (11/21 2230) Pulse Rate: 62 (11/21 2230)  Labs:  Recent Labs  07/21/13 2200 07/22/13 1841 07/22/13 2350  HGB 12.1 12.6  --   HCT 36.7 38.3  --   PLT 207 218  --   LABPROT  --   --  33.0*  INR  --   --  3.39*  CREATININE 0.85 0.92  --   TROPONINI <0.30  --   --     Estimated Creatinine Clearance: 51.7 ml/min (by C-G formula based on Cr of 0.92).   Medical History: Past Medical History  Diagnosis Date  . Paroxysmal atrial fibrillation 12/27/2008  . Hypertension   . Hypercholesteremia   . GERD (gastroesophageal reflux disease)   . Palpitations 01/10/2009    14 day monitor- some sinus tachycardia, PVCs    Medications:  Prescriptions prior to admission  Medication Sig Dispense Refill  . atorvastatin (LIPITOR) 10 MG tablet Take 10 mg by mouth daily with lunch.      . flecainide (TAMBOCOR) 50 MG tablet Take 50 mg by mouth 2 (two) times daily. 12pm and 12am      . fluticasone (FLONASE) 50 MCG/ACT nasal spray Place 2 sprays into the nose at bedtime as needed (congestion).      Marland Kitchen lisinopril (PRINIVIL,ZESTRIL) 10 MG tablet Take 1 tablet (10 mg total) by mouth daily.  90 tablet  3  . metoprolol succinate (TOPROL-XL) 25 MG 24 hr tablet Take 25 mg by mouth See admin instructions. Take 1 tablet (25 mg) daily before dinner, may take an additional 1/2 tablet (12.5 mg) 6 hours after initial dose if needed for rapid heart rate or pvc's      . Multiple Vitamin (MULTIVITAMIN WITH MINERALS) TABS tablet Take 1 tablet by mouth daily with lunch. Centrum Silver      . omeprazole (PRILOSEC) 20 MG capsule Take 20 mg by mouth daily as needed (indigestion).      . pseudoephedrine-acetaminophen (TYLENOL SINUS) 30-500 MG TABS Take 1 tablet by mouth 2 (two) times daily as needed  (congestion).      . warfarin (COUMADIN) 5 MG tablet Take 2.5-5 mg by mouth daily with lunch. Take 1/2 tablet (2.5 mg) on Monday and Wednesday and Friday, take 1 tablet (5 mg) on all other days       Scheduled:  . [START ON 07/23/2013] aspirin EC  81 mg Oral Daily  . [START ON 07/23/2013] atorvastatin  10 mg Oral Q lunch  . flecainide  50 mg Oral BID  . heparin  5,000 Units Subcutaneous Q8H  . [START ON 07/23/2013] lisinopril  10 mg Oral Daily  . [START ON 07/23/2013] metoprolol succinate  25 mg Oral QAC supper  . [START ON 07/23/2013] multivitamin with minerals  1 tablet Oral Q lunch  . [START ON 07/23/2013] pantoprazole  40 mg Oral Daily  . sodium chloride  3 mL Intravenous Q12H    Assessment: 72yo female c/o feeling "heart miss a beat", in ED monitor shows NSR w/ frequent PVCs, had episode of same recently d/t hypokalemia, K today 3.7 (yesterday 3.3), to continue Coumadin for PAF; last week pt was therapeutic (INR 2.4) at anti-coag clinic, now with supratherapeutic INR.  Goal of Therapy:  INR 2-3   Plan:  Pt took home dose today PTA; will  hold Coumadin dose for now and monitor INR to resume.  Vernard Gambles, PharmD, BCPS  07/22/2013,11:16 PM

## 2013-07-22 NOTE — ED Notes (Signed)
Dr. Wickline at bedside.  

## 2013-07-22 NOTE — ED Notes (Addendum)
Pt states she was seen here last night for A-fib, was told if her symptoms worsened to come back here so pt is here with c/o fullness in chest, and pain under left breast. Pt also c/o increased urination. Pt states eating makes her pain in chest worse.

## 2013-07-22 NOTE — ED Notes (Signed)
Here last night for irregular beats - PVC; has appt. With cardiologists on Monday. Told to come back here if symptoms get worse.

## 2013-07-22 NOTE — H&P (Signed)
Family Medicine Teaching [facility] Admission History and Physical Service Pager: [phone]  Patient name: Donna Simmons Medical record number: [id number] Date of birth: Jul 11, 1941 Age: 72 y.o. Gender: female  Primary Care Provider: Dow Adolph, MD Consultants: none Code Status: full (discussed with patient upon admission)  Chief Complaint: chest pain and palpitations  Assessment and Plan: Donna Simmons is a 72 y.o. female presenting with chest pain and palpitations . PMH is significant for chronic atrial fibrillation, hypertension, and hyperlipidemia.  # Chest discomfort: likely GERD vs. Musculoskeletal, but will admit for chest pain rule out - cycle troponin q6h x3 - monitor on telemetry - check A1c and lipids, defer checking TSH as had normal in July 2014 - recheck EKG in AM - continue PO PPI - aspirin 81mg  daily  # Suprapubic pain & urinary frequency:  - will check UA  # hyperlipidemia: continue home statin  # atrial fibrillation:  - continue home beta blocker and flecainide - coumadin per pharmacy  FEN/GI: heart healthy diet, SLIV Prophylaxis: SQ heparin  Disposition: pending ACS ruleout  History of Present Illness: Donna Simmons is a 72 y.o. female presenting with chest pain.  Patient reports that she came in to the ER yesterday with palpitations. She has a history of chronic atrial fibrillation. She was discharged home after her entire workup yesterday was negative. This afternoon she began to have palpitations, and had some associated heaviness in her chest. She also had a pain in her left breast. This brought her to the emergency room. She denies any shortness of breath, radiation to her neck or arm, diaphoresis, or nausea. The pain was actually improved with ambulation. The pain happened on and off. It started after eating. She does have a history of GERD.  She also notes a history of urinary frequency. She went to the bathroom about every 15  minutes today around lunch time. Since coming to the emergency room she is urinating about every 30 minutes. She is having some mild suprapubic pain, but no dysuria.   Review Of Systems: Per HPI, otherwise review of systems was performed and was unremarkable.  Patient Active Problem List   Diagnosis Date Noted  . Long term (current) use of anticoagulants 11/18/2012  . Hypercholesteremia 09/25/2012  . DDD (degenerative disc disease), cervical 05/19/2012  . Paroxysmal atrial fibrillation 04/20/2012  . HTN (hypertension) 04/20/2012   Past Medical History: Past Medical History  Diagnosis Date  . Paroxysmal atrial fibrillation 12/27/2008  . Hypertension   . Hypercholesteremia   . GERD (gastroesophageal reflux disease)   . Palpitations 01/10/2009    14 day monitor- some sinus tachycardia, PVCs   Past Surgical History: Past Surgical History  Procedure Laterality Date  . Breast surgery    . Fracture surgery    . Abdominal hysterectomy  1980  . Cardiac catheterization  12/16/2010    no evidence of CAD to explain anginal pain w/ positive troponin.  potential etiology is breakthrough AF  . Transthoracic echocardiogram  April 2010    Echo - EF >55; RV mils/mod dilated, RV systolic function mildly reduced; RA mild/moderately dilated; moderate pulmonary hypertension; mild tricuspid regurgitation  . Nm myoview ltd  April 2010    EF 64%, normal pattern of perfusion in all regions, no scintigraphic evidence of inducible ischemia; no significant wall motion abnormalities; EKG negative for ischemia; no significant change from last study; low risk scan   Social History: History  Substance Use Topics  . Smoking status: Never Smoker   .  Smokeless tobacco: Not on file  . Alcohol Use: No   Additional social history: no smoking, no drugs or alcohol  Please also refer to relevant sections of EMR.  Family History: Family History  Problem Relation Age of Onset  . Arthritis Mother     OSTEO  .  Hypertension Mother   . Dementia Mother   . Heart disease Father   . Hypertension Sister   . Dementia Sister   . Cancer - Lung Brother   . Cancer Maternal Grandmother   . Heart disease Maternal Grandfather   . Cancer - Lung Brother   . Hypertension Brother   . Cancer - Prostate Brother    Allergies and Medications: No Known Allergies No current facility-administered medications on file prior to encounter.   Current Outpatient Prescriptions on File Prior to Encounter  Medication Sig Dispense Refill  . atorvastatin (LIPITOR) 10 MG tablet Take 10 mg by mouth daily with lunch.      . flecainide (TAMBOCOR) 50 MG tablet Take 50 mg by mouth 2 (two) times daily. 12pm and 12am      . fluticasone (FLONASE) 50 MCG/ACT nasal spray Place 2 sprays into the nose at bedtime as needed (congestion).      Marland Kitchen lisinopril (PRINIVIL,ZESTRIL) 10 MG tablet Take 1 tablet (10 mg total) by mouth daily.  90 tablet  3  . metoprolol succinate (TOPROL-XL) 25 MG 24 hr tablet Take 25 mg by mouth See admin instructions. Take 1 tablet (25 mg) daily before dinner, may take an additional 1/2 tablet (12.5 mg) 6 hours after initial dose if needed for rapid heart rate or pvc's      . Multiple Vitamin (MULTIVITAMIN WITH MINERALS) TABS tablet Take 1 tablet by mouth daily with lunch. Centrum Silver      . omeprazole (PRILOSEC) 20 MG capsule Take 20 mg by mouth daily as needed (indigestion).      . pseudoephedrine-acetaminophen (TYLENOL SINUS) 30-500 MG TABS Take 1 tablet by mouth 2 (two) times daily as needed (congestion).      . warfarin (COUMADIN) 5 MG tablet Take 2.5-5 mg by mouth daily with lunch. Take 1/2 tablet (2.5 mg) on Monday and Wednesday and Friday, take 1 tablet (5 mg) on all other days        Objective: BP 119/69  Pulse 64  Temp(Src) 98 F (36.7 C) (Oral)  Resp 16  SpO2 99% Exam: General: NAD, pleasant, cooperative HEENT: NCAT, PERRL, MMM, no oral exudates Cardiovascular: RRR, no murmurs Respiratory: CTAB,  NWOB Abdomen: soft, mild suprapubic tenderness but otherwise nttp, +BS, no masses or organomegaly, no peritoneal signs Extremities: No appreciable lower extremity edema bilaterally, 2+ DP pulses Skin: no rashes noted Neuro: grossly nonfocal, speech intact, follows commands, alert and oriented, excellent historian  Labs and Imaging: CBC BMET   Recent Labs Lab 07/22/13 1841  WBC 3.7*  HGB 12.6  HCT 38.3  PLT 218    Recent Labs Lab 07/22/13 1841  NA 138  K 3.7  CL 101  CO2 26  BUN 7  CREATININE 0.92  GLUCOSE 122*  CALCIUM 9.6     CXR: No active disease. poc trop negative EKG NSR with frequent PVC's  Latrelle Dodrill, MD 07/22/2013, 10:26 PM PGY-2, [facility] Family Medicine FPTS Intern pager: [phone], text pages welcome

## 2013-07-23 DIAGNOSIS — R079 Chest pain, unspecified: Secondary | ICD-10-CM

## 2013-07-23 LAB — LIPID PANEL
LDL Cholesterol: 105 mg/dL — ABNORMAL HIGH (ref 0–99)
Total CHOL/HDL Ratio: 2.7 RATIO
Triglycerides: 99 mg/dL (ref <150)
VLDL: 20 mg/dL (ref 0–40)

## 2013-07-23 LAB — CBC
Hemoglobin: 12.4 g/dL (ref 12.0–15.0)
MCH: 25.6 pg — ABNORMAL LOW (ref 26.0–34.0)
MCHC: 32.4 g/dL (ref 30.0–36.0)
MCV: 79.1 fL (ref 78.0–100.0)
Platelets: 215 10*3/uL (ref 150–400)
RBC: 4.84 MIL/uL (ref 3.87–5.11)

## 2013-07-23 LAB — BASIC METABOLIC PANEL
BUN: 7 mg/dL (ref 6–23)
CO2: 26 mEq/L (ref 19–32)
Calcium: 9.4 mg/dL (ref 8.4–10.5)
Creatinine, Ser: 0.76 mg/dL (ref 0.50–1.10)
GFR calc Af Amer: >90 mL/min (ref >90)
GFR calc non Af Amer: 83 mL/min — ABNORMAL LOW (ref >90)
Glucose, Bld: 83 mg/dL (ref 70–99)
Potassium: 3.9 mEq/L (ref 3.5–5.1)
Sodium: 144 mEq/L (ref 135–145)

## 2013-07-23 LAB — HEMOGLOBIN A1C: Mean Plasma Glucose: 105 mg/dL (ref <117)

## 2013-07-23 LAB — TROPONIN I
Troponin I: <0.3 ng/mL (ref <0.30)
Troponin I: <0.3 ng/mL (ref <0.30)

## 2013-07-23 LAB — PROTIME-INR
INR: 3.39 — ABNORMAL HIGH (ref 0.00–1.49)
Prothrombin Time: 33 seconds — ABNORMAL HIGH (ref 11.6–15.2)

## 2013-07-23 MED ORDER — WARFARIN - PHARMACIST DOSING INPATIENT: Freq: Every day | Status: DC

## 2013-07-23 NOTE — Discharge Summary (Signed)
Family Medicine Teaching [facility] Discharge Summary  Patient name: Donna Simmons Medical record number: [id number] Date of birth: July 04, 1941 Age: 72 y.o. Gender: female Date of Admission: 07/22/2013  Date of Discharge: 07/23/2013 Admitting Physician: Carney Living, MD  Primary Care Provider: Dow Adolph, MD Consultants: none  Indication for Hospitalization: ACS r/o  Discharge Diagnoses/Problem List:  Afib HTN HLD GERD  Disposition: discharge home  Discharge Condition: improved  Discharge Exam: Gen:  NAD HEENT: Moist mucous membranes. Neck supple. No JVD. No adenopathies.  CV: Regular rate and rhythm, no murmurs rubs or gallops PULM: Clear to auscultation bilaterally. No wheezes/rales/rhonchi Chest: Tenderness to palpation on lateral aspect of left sided breast. No masses or skin lesions seen.  ABD: Soft, non tender, non distended, normal bowel sounds EXT: No edema Neuro: Alert and oriented x3. No focalization   Brief Hospital Course: Patient with PMHx of Afib came 2 days ago to ED with palpitations and was discharged home after negative work up. She was admitted yesterday with similar presentation and associated heaviness in her chest and pain in her left breast. She was admitted to r/o ACS even though her pain was atypical. She f/u with [facility] and had negative stress test 5 years ago and not known CAD. Troponin were negative x3 and EKG showed initially PVC's that resolved by today. No alarms on monitor during hospitalization. Pain resolved and only discomfort on area of breast is present that seems MSK in nature. She was continued on Coumadin and BB/ Flecainide during hospital stay.    She reports urinary frequency that self resolved by day 1 of hospitalization. UA showed hemoglobinuria but this is present in pt since 2009. No antibiotic treatment was indicated.   Issues for Follow Up: pt has already a f/u appointment with her cardiologist next  Tuesday.  Significant Procedures: none  Significant Labs and Imaging:   Recent Labs Lab 07/21/13 2200 07/22/13 1841 07/23/13 0626  WBC 6.0 3.7* 3.1*  HGB 12.1 12.6 12.4  HCT 36.7 38.3 38.3  PLT 207 218 215    Recent Labs Lab 07/21/13 2200 07/22/13 1841 07/23/13 0626  NA 139 138 144  K 3.3* 3.7 3.9  CL 101 101 105  CO2 29 26 26   GLUCOSE 82 122* 83  BUN 10 7 7   CREATININE 0.85 0.92 0.76  CALCIUM 9.3 9.6 9.4    Troponin x 3 negative  Results/Tests Pending at Time of Discharge: none  Discharge Medications:    Medication List         atorvastatin 10 MG tablet  Commonly known as:  LIPITOR  Take 10 mg by mouth daily with lunch.     flecainide 50 MG tablet  Commonly known as:  TAMBOCOR  Take 50 mg by mouth 2 (two) times daily. 12pm and 12am     fluticasone 50 MCG/ACT nasal spray  Commonly known as:  FLONASE  Place 2 sprays into the nose at bedtime as needed (congestion).     lisinopril 10 MG tablet  Commonly known as:  PRINIVIL,ZESTRIL  Take 1 tablet (10 mg total) by mouth daily.     metoprolol succinate 25 MG 24 hr tablet  Commonly known as:  TOPROL-XL  Take 25 mg by mouth See admin instructions. Take 1 tablet (25 mg) daily before dinner, may take an additional 1/2 tablet (12.5 mg) 6 hours after initial dose if needed for rapid heart rate or pvc's     multivitamin with minerals Tabs tablet  Take 1 tablet  by mouth daily with lunch. Centrum Silver     omeprazole 20 MG capsule  Commonly known as:  PRILOSEC  Take 20 mg by mouth daily as needed (indigestion).     pseudoephedrine-acetaminophen 30-500 MG Tabs  Commonly known as:  TYLENOL SINUS  Take 1 tablet by mouth 2 (two) times daily as needed (congestion).     warfarin 5 MG tablet  Commonly known as:  COUMADIN  Take 2.5-5 mg by mouth daily with lunch. Take 1/2 tablet (2.5 mg) on Monday and Wednesday and Friday, take 1 tablet (5 mg) on all other days        Discharge Instructions: Please refer to  Patient Instructions section of EMR for full details.  Patient was counseled important signs and symptoms that should prompt return to medical care, changes in medications, dietary instructions, activity restrictions, and follow up appointments.   Follow-Up Appointments: Cardiology [facility]) next Tuesday 07/26/2013. Pt was instructed to schedule and appointment with PCP: Dow Adolph as soon as possible for an office visit in 7-10 days after discharge.  Marchele Decock [place] Peaches, MD 07/23/2013, 11:33 AM PGY-3, [facility] Family Medicine

## 2013-07-23 NOTE — H&P (Signed)
Family Medicine Teaching Service Attending Note  I interviewed and examined patient Donna Simmons and reviewed their tests and x-rays.  I discussed with Dr. Pollie Meyer and reviewed their note for today.  I agree with their assessment and plan.     Additionally  Feels well this AM  Walking in halls without symptoms Pain and palpitations were better with exertion yesterday No sign of ACS Probable transient increase in PVC was cause if symptoms Ok to discharge to follow up with her cardiologist as scheduled in 3 days

## 2013-07-23 NOTE — Discharge Summary (Signed)
I have reviewed this discharge summary and agree.    

## 2013-07-23 NOTE — Progress Notes (Signed)
Family Medicine Teaching Service Daily Progress Note Intern Pager: [phone]  Patient name: Donna Simmons Medical record number: [id number] Date of birth: 10/11/1940 Age: 72 y.o. Gender: female  Primary Care Provider: Dow Adolph, MD Consultants: none Code Status: full  Pt Overview and Major Events to Date: none  Assessment and Plan:  Donna Simmons is a 72 y.o. female presenting with chest pain and palpitations. PMH is significant for chronic atrial fibrillation, hypertension, and hyperlipidemia.   # Chest discomfort: pain is reproducible with palpation. No skin lesions that will raise concern for zoster (pt had vaccination) most likely musculoskeletal. Had strest test done 5 years ago negative for CAD. - troponin neg x3 - no alarms on tele, EKG only with sinus bradycardia. Pt is on Flecainide and BB for rate control.   - A1c in process, TSH normal in July, Lipid profile improved in the last 4 months(on Lipitor)  - continue PO PPI  - aspirin 81mg  daily  - pt has appointment with Cardiologist next Tuesday [facility])  # Suprapubic pain & urinary frequency with no complaints this am.    - UA positive for hemoglobin (this has been present since 2009), no indication for antibiotic therapy.   # hyperlipidemia: Lipide profile  with LDL in 105 (improved form 4 months ago 197) - continue home statin   # atrial fibrillation:  - continue home beta blocker and flecainide. EKG this am with sinus bradycardia 60-70 range - continue coumadin.  FEN/GI: heart healthy diet, SLIV  Prophylaxis: SQ heparin  Disposition: discharge home  Subjective: feeling well, mild discomfort only lateral aspect at the level of 5-6 rib. No other complaints.   Objective: Temp:  [97.5 F (36.4 C)-98.3 F (36.8 C)] 97.5 F (36.4 C) (11/22 0512) Pulse Rate:  [61-97] 66 (11/22 0512) Resp:  [12-19] 15 (11/22 0512) BP: (115-165)/(69-78) 115/75 mmHg (11/22 0512) SpO2:  [98 %-100 %] 99 % (11/22  0512) Weight:  [143 lb 14.4 oz (65.273 kg)] 143 lb 14.4 oz (65.273 kg) (11/21 2317) Physical Exam: Gen:  NAD HEENT: Moist mucous membranes. Neck supple. No JVD. No adenopathies.  CV: Regular rate and rhythm, no murmurs rubs or gallops PULM: Clear to auscultation bilaterally. No wheezes/rales/rhonchi Chest: Tenderness to palpation on lateral aspect of left sided breast. No masses palpated or skin lesions seen.  ABD: Soft, non tender, non distended, normal bowel sounds EXT: No edema Neuro: Alert and oriented x3. No focalization   Laboratory:  Recent Labs Lab 07/21/13 2200 07/22/13 1841 07/23/13 0626  WBC 6.0 3.7* 3.1*  HGB 12.1 12.6 12.4  HCT 36.7 38.3 38.3  PLT 207 218 215    Recent Labs Lab 07/21/13 2200 07/22/13 1841 07/23/13 0626  NA 139 138 144  K 3.3* 3.7 3.9  CL 101 101 105  CO2 29 26 26   BUN 10 7 7   CREATININE 0.85 0.92 0.76  CALCIUM 9.3 9.6 9.4  GLUCOSE 82 122* 83   Troponin: neg x3  Imaging/Diagnostic Tests:  EKG: sinus tachycardia and some PVC's today Sinus Bradycardia.   Wendolyn Raso Piloto de Criselda Peaches, MD 07/23/2013, 9:36 AM PGY-3, [facility] FPTS Intern pager: [phone], text pages welcome

## 2013-07-23 NOTE — Progress Notes (Signed)
Family Medicine Teaching Service Attending Note  I interviewed and examined patient Donna Simmons and reviewed their tests and x-rays.  I discussed with Dr. Piloto and reviewed their note for today.  I agree with their assessment and plan.       

## 2013-07-24 NOTE — ED Provider Notes (Signed)
I have personally seen and examined the patient.  I have discussed the plan of care with the resident.  I have reviewed the documentation on PMH/FH/Soc. History.  I have reviewed the documentation of the resident and agree.  I have reviewed and agree with the ECG interpretation(s) documented by the resident.  Pt stable/feels improved but would benefit from admission/monitoring and patient agreeable   Joya Gaskins, MD 07/24/13 2132

## 2013-07-25 ENCOUNTER — Encounter: Payer: Self-pay | Admitting: Cardiology

## 2013-07-25 ENCOUNTER — Ambulatory Visit (INDEPENDENT_AMBULATORY_CARE_PROVIDER_SITE_OTHER): Payer: Medicare PPO | Admitting: Cardiology

## 2013-07-25 VITALS — BP 142/76 | HR 72 | Ht 64.0 in | Wt 143.9 lb

## 2013-07-25 DIAGNOSIS — R0789 Other chest pain: Secondary | ICD-10-CM

## 2013-07-25 DIAGNOSIS — I10 Essential (primary) hypertension: Secondary | ICD-10-CM

## 2013-07-25 DIAGNOSIS — Z7901 Long term (current) use of anticoagulants: Secondary | ICD-10-CM

## 2013-07-25 DIAGNOSIS — E7800 Pure hypercholesterolemia, unspecified: Secondary | ICD-10-CM

## 2013-07-25 DIAGNOSIS — I4891 Unspecified atrial fibrillation: Secondary | ICD-10-CM

## 2013-07-25 DIAGNOSIS — I480 Paroxysmal atrial fibrillation: Secondary | ICD-10-CM

## 2013-07-25 NOTE — Progress Notes (Signed)
Patient ID: [name], female   DOB: Jan 02, 1941, 72 y.o.   MRN: [id number]  PCP: Donna Adolph, MD  Clinic Note: Chief Complaint  Patient presents with  . Follow-up    ER visit 11/21 for palpitations and 11/22 for chest pressure.  Chest pressure comes and goes throught the day.  Always has pressure after eating.  No palps since 11/20.  No SOB, edema or dizziness.   HPI: Donna Simmons is a 72 y.o. female with a PMH below who presents today for routine followup which happens to be post recent hospitalization. I saw her back in late July and increased her beta blocker dose. Since then, she went to the emergency room on October 6 with increasing frequency of palpitations. Upon arrival to the emergency room, she was in sinus rhythm. At that time she noticed once weekly episodes lasting 3-4 minutes. That event was roughly 25 minutes. Also noted some atypical chest discomfort as well as dyspnea. She was taking an over-the-counter sinus medicine to help with her sinusitis symptoms. She left the emergency room in stable condition after a short visit. She saw swelling about the emergency room on November 20 with palpitations. This is thought to be due to frequent PVCs. No changes are made she went home. She then presented again following day and was admitted for overnight observation. At that time sooner as a pressure sensation over her chest that started while cleaning her house. It was relieved at rest. Lasted about 30 minutes. Upon arrival to the emergency room she was again back in sinus rhythm with PVCs. She was admitted to the [facility] Medicine inpatient service, and ruled out for MI. Discomfort she was noticing in her left chest was really more along the left inferior rib margin. She was discharged home to followup with me today.  Interval History:  Distally the hospital, she says she noticed that her palpitations have come less frequently. Interestingly at the time of her most recent ER  visit she was noticing increasing urinary frequency and almost loss of bladder control. She has not had anymore the chest discomfort. This is very similar to the symptoms she had when she was in atrial fibrillation several years ago when she had a heart catheterization that showed no evidence of CAD -- described as a fullness across her chest. The next episode she noted however was starting from her midaxillary line and left lower back radiating to the front. He was somewhat cramping in nature and is still somewhat present, this is not similar to her age of fibrillation-related discomfort.  Otherwise the remainder of cardiac review of systems is as follows: Cardiovascular ROS: positive for - Increasing episodes of palpitations. In addition to being more frequent, they are lasting longer. I would not be surprised if the episode admit her to the hospital this last time was truly atrial fibrillation. negative for - dyspnea on exertion, edema, loss of consciousness, murmur, orthopnea, paroxysmal nocturnal dyspnea or shortness of breath. She denies any other episodes of dizziness, no syncope or near-syncope. No TIA or amaurosis fugax symptoms. No melena, hematochezia or hematuria.  Past Medical History  Diagnosis Date  . Paroxysmal atrial fibrillation 12/27/2008  . Hypertension   . Hypercholesteremia   . GERD (gastroesophageal reflux disease)   . Palpitations 01/10/2009    14 day monitor- some sinus tachycardia, PVCs   Prior Cardiac Evaluation and Past Surgical History: Past Surgical History  Procedure Laterality Date  . Breast surgery    .  Fracture surgery    . Abdominal hysterectomy  1980  . Cardiac catheterization  12/16/2010    no evidence of CAD to explain anginal pain w/ positive troponin.  potential etiology is breakthrough AF  . Transthoracic echocardiogram  April 2010    Echo - EF >55; RV mils/mod dilated, RV systolic function mildly reduced; RA mild/moderately dilated; moderate pulmonary  hypertension; mild tricuspid regurgitation  . Nm myoview ltd  April 2010    EF 64%, normal pattern of perfusion in all regions, no scintigraphic evidence of inducible ischemia; no significant wall motion abnormalities; EKG negative for ischemia; no significant change from last study; low risk scan   No Known Allergies  Current Outpatient Prescriptions  Medication Sig Dispense Refill  . atorvastatin (LIPITOR) 10 MG tablet Take 10 mg by mouth daily with lunch.      . flecainide (TAMBOCOR) 50 MG tablet Take 50 mg by mouth 2 (two) times daily. 12pm and 12am      . fluticasone (FLONASE) 50 MCG/ACT nasal spray Place 2 sprays into the nose at bedtime as needed (congestion).      Marland Kitchen lisinopril (PRINIVIL,ZESTRIL) 10 MG tablet Take 1 tablet (10 mg total) by mouth daily.  90 tablet  3  . metoprolol succinate (TOPROL-XL) 25 MG 24 hr tablet Take 25 mg by mouth See admin instructions. Take 1 tablet (25 mg) daily before dinner, may take an additional 1/2 tablet (12.5 mg) 6 hours after initial dose if needed for rapid heart rate or pvc's      . Multiple Vitamin (MULTIVITAMIN WITH MINERALS) TABS tablet Take 1 tablet by mouth daily with lunch. Centrum Silver      . omeprazole (PRILOSEC) 20 MG capsule Take 20 mg by mouth daily.       . pseudoephedrine-acetaminophen (TYLENOL SINUS) 30-500 MG TABS Take 1 tablet by mouth 2 (two) times daily as needed (congestion).      . warfarin (COUMADIN) 5 MG tablet Take 2.5-5 mg by mouth daily with lunch. Take 1/2 tablet (2.5 mg) on Monday and Wednesday and Friday, take 1 tablet (5 mg) on all other days       No current facility-administered medications for this visit.    History   Social History  . Marital Status: Married    Spouse Name: N/A    Number of Children: N/A  . Years of Education: N/A   Occupational History  . Not on file.   Social History Main Topics  . Smoking status: Never Smoker   . Smokeless tobacco: Not on file  . Alcohol Use: No  . Drug Use: No  .  Sexual Activity: Yes   Other Topics Concern  . Not on file   Social History Narrative   Married with no children. Walks several times a week at least 3-4 times a week about 2 miles a time. Does not smoke and does not drink.    ROS: A comprehensive Review of Systems - Negative except Pertinent positives noted above in history of present illness, noncardiac symptoms below. Gastrointestinal ROS: positive for - GERD, but is improved since she has adjusted her diet. Genito-Urinary ROS: positive for - urinary frequency/urgency  PHYSICAL EXAM BP 142/76  Pulse 72  Ht 5\' 4"  (1.626 m)  Wt 143 lb 14.4 oz (65.273 kg)  BMI 24.69 kg/m2 General appearance: alert, cooperative, appears stated age, no distress and Well-nourished, well-groomed. Normal mood and affect. Neck: no adenopathy, no carotid bruit, no JVD and supple, symmetrical, trachea midline Lungs: CTA  B., normal percussion bilaterally and Nonlabored, good air movement Heart: RRR, S1, S2 normal, no murmur, click, rub or gallop and normal apical impulse Abdomen: soft, non-tender; bowel sounds normal; no masses,  no organomegaly Extremities: No C./C./E. Pulses: 2+ and symmetric Neurologic: Grossly normal MSK: She definitely has tenderness along the lower left inferior costal margin.  AVW:[id number] today: Yes Rate: 72 , Rhythm: NSR nonspecific ST changes, otherwise normal.  Recent Labs: Admission labs reviewed in Epic  ASSESSMENT / PLAN:  Paroxysmal atrial fibrillation Let's try a different plan for as needed treatment for atrial fibrillation episodes: Using when necessary flecainide as opposed to beta blocker. If she is closer a.m. dose of flecainide, she should certainly take an additional 50 mg tablet if she were to go into atrial fibrillation. It is in the middle of the day 2 hours away from either dose, she should take 100 mg once. It is close to the evening dose she should just take 100 mg a lieu of her evening dose. The hope is  that this would chemically cardiovert her. She will continue with the current dose of metoprolol. Only if she feels her heart going very fast when she is an additional when necessary dose.  Otherwise at the relatively stable. I will check your INR today, and we'll have Belenda Cruise contact you tomorrow.  Long term (current) use of anticoagulants On warfarin. We'll check her INR today. Will be adjusted by Phillips Hay. RPH.  HTN (hypertension) Stable. On good dose of beta blocker.  Hypercholesteremia Goal LDL is less than 130. She is currently on atorvastatin. Last cholesterol shows that she is within goal. Continue current or TC 200, HDL 75, LDL 105, TG 99  Chest pressure A chest pressure sensation she noted, was very similar to what she felt when she was in atrial fibrillation. Heart catheterization at that time showed native coronary arteries disease. The current symptoms she is feeling along the left costal margin is probably musculoskeletal. I agree with her primary family medicine service.   Orders Placed This Encounter  Procedures  . EKG 12-Lead   No orders of the defined types were placed in this encounter.   Followup: 6 months with PA/NP. One year with M.D.  Piedad Climes. Herbie Baltimore, M.D., M.S. [facility] [address] [place], [place]  [address]  [phone] Pager # [phone]

## 2013-07-25 NOTE — Patient Instructions (Signed)
Sorry to you that you had to go to the emergency room this weekend. It sounds like he had an episode of atrial fibrillation. The pain in your left-sided chest seems mostly musculoskeletal, or like a pinched nerve.  Her EKG today says her back in sinus rhythm.  Let's try a different plan for as needed treatment for these are itchy fibrillation episodes: If it's right near your morning dose of flecainide simply take an additional dose, it is close to evening dose to 100 mg (2 tab). If it's during the middle of the day take 2 tablets, and then simply hold your evening dose if you're no longer in nature fibrillation.  Otherwise at the relatively stable. I will check your INR today, and we'll have Belenda Cruise contact you tomorrow.  All actually have a followup with one of my advanced local providers (NP/PA) in 6 months, and then me in a year if stable  Crickett Abbett W, M.D., M.S. [facility] [address] [place], [place]  [address]  [phone]  .

## 2013-07-26 ENCOUNTER — Ambulatory Visit: Payer: Medicare PPO | Admitting: Pharmacist Clinician (PhC)/ Clinical Pharmacy Specialist

## 2013-07-26 ENCOUNTER — Ambulatory Visit (INDEPENDENT_AMBULATORY_CARE_PROVIDER_SITE_OTHER): Payer: Medicare PPO | Admitting: Pharmacist Clinician (PhC)/ Clinical Pharmacy Specialist

## 2013-07-26 DIAGNOSIS — I4891 Unspecified atrial fibrillation: Secondary | ICD-10-CM

## 2013-07-26 DIAGNOSIS — I480 Paroxysmal atrial fibrillation: Secondary | ICD-10-CM

## 2013-07-26 DIAGNOSIS — Z7901 Long term (current) use of anticoagulants: Secondary | ICD-10-CM

## 2013-07-26 LAB — POCT INR: INR: 5.8

## 2013-07-27 ENCOUNTER — Encounter: Payer: Self-pay | Admitting: Cardiology

## 2013-07-27 DIAGNOSIS — R0789 Other chest pain: Secondary | ICD-10-CM | POA: Insufficient documentation

## 2013-07-27 NOTE — Assessment & Plan Note (Addendum)
On warfarin. We'll check her INR today. Will be adjusted by Phillips Hay. RPH.

## 2013-07-27 NOTE — Assessment & Plan Note (Addendum)
Goal LDL is less than 130. She is currently on atorvastatin. Last cholesterol shows that she is within goal. Continue current or TC 200, HDL 75, LDL 105, TG 99

## 2013-07-27 NOTE — Assessment & Plan Note (Signed)
Stable. On good dose of beta blocker.

## 2013-07-27 NOTE — Assessment & Plan Note (Signed)
Let's try a different plan for as needed treatment for atrial fibrillation episodes: Using when necessary flecainide as opposed to beta blocker. If she is closer a.m. dose of flecainide, she should certainly take an additional 50 mg tablet if she were to go into atrial fibrillation. It is in the middle of the day 2 hours away from either dose, she should take 100 mg once. It is close to the evening dose she should just take 100 mg a lieu of her evening dose. The hope is that this would chemically cardiovert her. She will continue with the current dose of metoprolol. Only if she feels her heart going very fast when she is an additional when necessary dose.  Otherwise at the relatively stable. I will check your INR today, and we'll have Belenda Cruise contact you tomorrow.

## 2013-07-27 NOTE — Assessment & Plan Note (Signed)
A chest pressure sensation she noted, was very similar to what she felt when she was in atrial fibrillation. Heart catheterization at that time showed native coronary arteries disease. The current symptoms she is feeling along the left costal margin is probably musculoskeletal. I agree with her primary family medicine service.

## 2013-08-01 ENCOUNTER — Other Ambulatory Visit: Payer: Self-pay | Admitting: Family Medicine

## 2013-08-01 DIAGNOSIS — Z1231 Encounter for screening mammogram for malignant neoplasm of breast: Secondary | ICD-10-CM

## 2013-08-03 ENCOUNTER — Ambulatory Visit (INDEPENDENT_AMBULATORY_CARE_PROVIDER_SITE_OTHER): Payer: Medicare PPO | Admitting: Pharmacist Clinician (PhC)/ Clinical Pharmacy Specialist

## 2013-08-03 VITALS — BP 132/76 | HR 64

## 2013-08-03 DIAGNOSIS — I480 Paroxysmal atrial fibrillation: Secondary | ICD-10-CM

## 2013-08-03 DIAGNOSIS — I4891 Unspecified atrial fibrillation: Secondary | ICD-10-CM

## 2013-08-03 DIAGNOSIS — Z7901 Long term (current) use of anticoagulants: Secondary | ICD-10-CM

## 2013-08-17 ENCOUNTER — Ambulatory Visit (INDEPENDENT_AMBULATORY_CARE_PROVIDER_SITE_OTHER): Payer: Medicare PPO | Admitting: Pharmacist Clinician (PhC)/ Clinical Pharmacy Specialist

## 2013-08-17 VITALS — BP 146/90 | HR 68

## 2013-08-17 DIAGNOSIS — I480 Paroxysmal atrial fibrillation: Secondary | ICD-10-CM

## 2013-08-17 DIAGNOSIS — I4891 Unspecified atrial fibrillation: Secondary | ICD-10-CM

## 2013-08-17 DIAGNOSIS — Z7901 Long term (current) use of anticoagulants: Secondary | ICD-10-CM

## 2013-08-17 LAB — POCT INR: INR: 1.9

## 2013-08-26 ENCOUNTER — Ambulatory Visit (INDEPENDENT_AMBULATORY_CARE_PROVIDER_SITE_OTHER): Payer: Medicare PPO | Admitting: Internal Medicine

## 2013-08-26 VITALS — BP 164/90 | HR 76 | Temp 98.0°F | Resp 16 | Ht 63.5 in | Wt 147.0 lb

## 2013-08-26 DIAGNOSIS — I10 Essential (primary) hypertension: Secondary | ICD-10-CM

## 2013-08-26 DIAGNOSIS — E876 Hypokalemia: Secondary | ICD-10-CM

## 2013-08-26 MED ORDER — LISINOPRIL 10 MG PO TABS: 20.0000 mg | ORAL_TABLET | Freq: Every day | ORAL | Status: DC

## 2013-08-26 NOTE — Progress Notes (Signed)
   Subjective:    Patient ID: Donna Simmons, female    DOB: 04-16-1941, 72 y.o.   MRN: [id number]  HPI has noticed elevated home blood pressure recordings over the last 48 hours No symptoms Denies chest pain palpitations shortness of breath dyspnea on exertion or edema No change in medications History of atrial fib has been stable recently Note visit with Dr. Herbie Baltimore Noticed recent low potassium so hydrochlorothiazide was discontinued early November   Patient Active Problem List   Diagnosis Date Noted  . Chest pressure 07/27/2013  . Long term (current) use of anticoagulants 11/18/2012  . Hypercholesteremia 09/25/2012  . DDD (degenerative disc disease), cervical 05/19/2012  . Paroxysmal atrial fibrillation 04/20/2012  . HTN (hypertension) 04/20/2012  Current outpatient prescriptions:atorvastatin (LIPITOR) 10 MG tablet, Take 10 mg by mouth daily with lunch., Disp: , Rfl: ;  flecainide (TAMBOCOR) 50 MG tablet, Take 50 mg by mouth 2 (two) times daily. 12pm and 12am, Disp: , Rfl: ;  fluticasone (FLONASE) 50 MCG/ACT nasal spray, Place 2 sprays into the nose at bedtime as needed (congestion)., Disp: , Rfl:  lisinopril (PRINIVIL,ZESTRIL) 10 MG tablet, Take 2 tablets (20 mg total) by mouth daily., Disp: 180 tablet, Rfl: 2;  metoprolol succinate (TOPROL-XL) 25 MG 24 hr tablet, Take 25 mg by mouth See admin instructions. Take 1 tablet (25 mg) daily before dinner, may take an additional 1/2 tablet (12.5 mg) 6 hours after initial dose if needed for rapid heart rate or pvc's, Disp: , Rfl:  Multiple Vitamin (MULTIVITAMIN WITH MINERALS) TABS tablet, Take 1 tablet by mouth daily with lunch. Centrum Silver, Disp: , Rfl: ;  omeprazole (PRILOSEC) 20 MG capsule, Take 20 mg by mouth daily. , Disp: , Rfl: ;  pseudoephedrine-acetaminophen (TYLENOL SINUS) 30-500 MG TABS, Take 1 tablet by mouth 2 (two) times daily as needed (congestion)., Disp: , Rfl:  warfarin (COUMADIN) 5 MG tablet, Take 2.5-5 mg by mouth daily  with lunch. Take 1/2 tablet (2.5 mg) on Monday and Wednesday and Friday, take 1 tablet (5 mg) on all other days, Disp: , Rfl:       Review of Systems  Constitutional: Negative for activity change, appetite change, fatigue and unexpected weight change.  Eyes: Negative for visual disturbance.  Respiratory: Negative for chest tightness and shortness of breath.   Cardiovascular: Negative for chest pain, palpitations and leg swelling.  Gastrointestinal: Negative for abdominal distention.  Genitourinary: Negative for difficulty urinating.  Neurological: Negative for weakness and headaches.       Objective:   Physical Exam BP 164/90  Pulse 76  Temp(Src) 98 F (36.7 C) (Oral)  Resp 16  Ht 5' 3.5" (1.613 m)  Wt 147 lb (66.679 kg)  BMI 25.63 kg/m2  SpO2 99% Repeat blood pressure 160/90  HEENT clear No carotid bruits Heart regular without murmur click Lungs clear No peripheral edema       Assessment & Plan:  Hypertension uncontrolled Atrial fibrillation on Coumadin but stable  Etiology could be discontinuation of hydrochlorothiazide, electrolyte abnormality, progressive heart problems  Plan-increase lisinopril to 20 mg Continue home monitoring Check metabolic profile

## 2013-08-27 LAB — CBC WITH DIFFERENTIAL/PLATELET
Basophils Absolute: 0 10*3/uL (ref 0.0–0.1)
Basophils Relative: 0 % (ref 0–1)
Eosinophils Absolute: 0 10*3/uL (ref 0.0–0.7)
HCT: 39.1 % (ref 36.0–46.0)
Lymphocytes Relative: 32 % (ref 12–46)
MCH: 25.1 pg — ABNORMAL LOW (ref 26.0–34.0)
MCHC: 32.2 g/dL (ref 30.0–36.0)
Monocytes Absolute: 0.2 10*3/uL (ref 0.1–1.0)
Neutro Abs: 2.3 10*3/uL (ref 1.7–7.7)
Neutrophils Relative %: 62 % (ref 43–77)
RDW: 16.2 % — ABNORMAL HIGH (ref 11.5–15.5)

## 2013-08-27 LAB — COMPREHENSIVE METABOLIC PANEL
ALT: 15 U/L (ref 0–35)
AST: 20 U/L (ref 0–37)
Albumin: 4.5 g/dL (ref 3.5–5.2)
Alkaline Phosphatase: 59 U/L (ref 39–117)
CO2: 29 mEq/L (ref 19–32)
Calcium: 9.5 mg/dL (ref 8.4–10.5)
Chloride: 103 mEq/L (ref 96–112)
Creat: 0.89 mg/dL (ref 0.50–1.10)
Potassium: 3.8 mEq/L (ref 3.5–5.3)
Sodium: 140 mEq/L (ref 135–145)
Total Protein: 7.2 g/dL (ref 6.0–8.3)

## 2013-08-27 LAB — TSH: TSH: 1.876 u[IU]/mL (ref 0.350–4.500)

## 2013-08-27 LAB — POTASSIUM: Potassium: 3.8 mEq/L (ref 3.5–5.3)

## 2013-08-31 ENCOUNTER — Telehealth: Payer: Self-pay

## 2013-08-31 NOTE — Telephone Encounter (Signed)
Patient called about labs.  Reviewed labs with patient, patient states understanding.

## 2013-09-05 ENCOUNTER — Ambulatory Visit (HOSPITAL_COMMUNITY)
Admission: RE | Admit: 2013-09-05 | Discharge: 2013-09-05 | Disposition: A | Discharge status: Home / Self Care | Payer: Medicare PPO | Source: Ambulatory Visit | Attending: Family Medicine | Admitting: Family Medicine

## 2013-09-05 DIAGNOSIS — Z1231 Encounter for screening mammogram for malignant neoplasm of breast: Secondary | ICD-10-CM | POA: Insufficient documentation

## 2013-09-07 ENCOUNTER — Ambulatory Visit (INDEPENDENT_AMBULATORY_CARE_PROVIDER_SITE_OTHER): Payer: Medicare PPO | Admitting: Family Medicine

## 2013-09-07 ENCOUNTER — Encounter: Payer: Self-pay | Admitting: Family Medicine

## 2013-09-07 VITALS — BP 140/88 | HR 60 | Temp 97.6°F | Resp 16 | Ht 63.0 in | Wt 146.0 lb

## 2013-09-07 DIAGNOSIS — I10 Essential (primary) hypertension: Secondary | ICD-10-CM

## 2013-09-07 NOTE — Patient Instructions (Addendum)
Osteoporosis Osteoporosis happens when your bones become weak because of bone loss. Weak bones can break (fracture) more easily with slips or falls. You are more likely to develop osteoporosis if:  You are a woman.  You are older than 50 years.  You are white or Asian.  You are very thin.  Someone in your family has had osteoporosis.  You smoke or use nicotine. CAUSES   Smoking.  Too much drinking.  Being a weight below normal.  Not being active.  Not going outside in the sun enough.  Certain medical conditions, such as diabetes or Crohn disease.  Certain medicines, such as steroids or antiseizure medicines. TREATMENT  The goal of treatment is to strengthen bones. There are different types of medicines that help your bones. Some medicines make your bones more solid. Some medicines help to slow down how much bone you lose. Your doctor may check to see if you are getting enough calcium and vitamin D in your diet.  GET OSCAL + D or CITRCAL + D and take 1 tablet daily with your multivitamin. PREVENTION   Make sure you get enough calcium and vitamin D.  Make sure you exercise often.  If you smoke, quit. MAKE SURE YOU:  Understand these instructions.  Will watch your condition.  Will get help right away if you are not doing well or get worse. Document Released: 11/10/2011 Document Reviewed: 11/10/2011 [facility] Patient Information 2014 [employer] [place].    HTN- Take LISINOPRIL 10 MG 1 1/2 TABLETS DAILY UNTIL YOU SEE DR. HARDING.

## 2013-09-07 NOTE — Progress Notes (Signed)
S:  This 73 y.o. AA female has HTN; she was seen on 08/26/13 for elevated BP. Other than elevated BP, exam was unremarkable. Pt was asymptomatic and remains so after increasing Lisinopril to 20 mg daily. She monitors BP at home and has record of readings with average 140-160/ 80-90. She does have on reading= 115/69; she felt a little lightheaded w/ that reading. Pt avoids excess salt and caffeine; she and her husband walk almost daily. She denies fatigue, diaphoresis, vision disturbances, CP or tightness, palpitations, SOB or DOE, cough, HA, numbness or syncope.  Pt has appt with Dr. Bryan Lemmaavid Harding later this month for cardiology follow-up/ chronic atrial fibrillation.  Pt had LIFELINE screening and was found to be at "Moderate Risk" for osteoporosis.   Patient Active Problem List   Diagnosis Date Noted  . Chest pressure 07/27/2013  . Long term (current) use of anticoagulants 11/18/2012  . Hypercholesteremia 09/25/2012  . DDD (degenerative disc disease), cervical 05/19/2012  . Paroxysmal atrial fibrillation 04/20/2012  . HTN (hypertension) 04/20/2012    PMHX, Soc and Fam Hx reviewed.  Medications reconciled.  ROS: AS per HPI.  O: Filed Vitals:   09/07/13 1027  BP: 140/88  Pulse: 60  Temp: 97.6 F (36.4 C)  Resp: 16   GEN: In NAD; WN,WD. HENT: [place]/AT; EOMI w/ clear conj/sclerae. COR: RRR. LUNGS: Unlabored resp. SKIN: W&D; intact w/o diaphoresis. NEURO: A&O x 3; CNs intact. Nonfocal.  A/P: HTN (hypertension)- Reduce Lisinopril 10 mg 2 tablets daily to 1 1/2 tablets daily; follow-up w/ Cardiology as scheduled for further medications modifications.  RTC in 6 months for CPE.

## 2013-09-14 ENCOUNTER — Ambulatory Visit (INDEPENDENT_AMBULATORY_CARE_PROVIDER_SITE_OTHER): Payer: Medicare PPO | Admitting: Pharmacist Clinician (PhC)/ Clinical Pharmacy Specialist

## 2013-09-14 ENCOUNTER — Ambulatory Visit: Payer: Medicare PPO | Admitting: Pharmacist Clinician (PhC)/ Clinical Pharmacy Specialist

## 2013-09-14 VITALS — BP 146/80 | HR 72

## 2013-09-14 DIAGNOSIS — I4891 Unspecified atrial fibrillation: Secondary | ICD-10-CM

## 2013-09-14 DIAGNOSIS — Z7901 Long term (current) use of anticoagulants: Secondary | ICD-10-CM

## 2013-09-14 DIAGNOSIS — I480 Paroxysmal atrial fibrillation: Secondary | ICD-10-CM

## 2013-09-14 LAB — POCT INR: INR: 2.6

## 2013-10-12 ENCOUNTER — Ambulatory Visit (INDEPENDENT_AMBULATORY_CARE_PROVIDER_SITE_OTHER): Payer: Medicare PPO | Admitting: Pharmacist Clinician (PhC)/ Clinical Pharmacy Specialist

## 2013-10-12 DIAGNOSIS — I4891 Unspecified atrial fibrillation: Secondary | ICD-10-CM

## 2013-10-12 DIAGNOSIS — I480 Paroxysmal atrial fibrillation: Secondary | ICD-10-CM

## 2013-10-12 DIAGNOSIS — Z7901 Long term (current) use of anticoagulants: Secondary | ICD-10-CM

## 2013-10-12 LAB — POCT INR: INR: 2.6

## 2013-10-18 ENCOUNTER — Other Ambulatory Visit: Payer: Self-pay | Admitting: Cardiology

## 2013-11-07 ENCOUNTER — Other Ambulatory Visit: Payer: Self-pay | Admitting: Cardiology

## 2013-11-09 ENCOUNTER — Ambulatory Visit (INDEPENDENT_AMBULATORY_CARE_PROVIDER_SITE_OTHER): Payer: Medicare PPO | Admitting: Pharmacist Clinician (PhC)/ Clinical Pharmacy Specialist

## 2013-11-09 DIAGNOSIS — I480 Paroxysmal atrial fibrillation: Secondary | ICD-10-CM

## 2013-11-09 DIAGNOSIS — I4891 Unspecified atrial fibrillation: Secondary | ICD-10-CM

## 2013-11-09 DIAGNOSIS — Z7901 Long term (current) use of anticoagulants: Secondary | ICD-10-CM

## 2013-11-09 LAB — POCT INR: INR: 1.4

## 2013-11-30 ENCOUNTER — Ambulatory Visit: Payer: Medicare PPO | Admitting: Pharmacist Clinician (PhC)/ Clinical Pharmacy Specialist

## 2013-12-07 ENCOUNTER — Ambulatory Visit (INDEPENDENT_AMBULATORY_CARE_PROVIDER_SITE_OTHER): Payer: Medicare PPO | Admitting: Pharmacist Clinician (PhC)/ Clinical Pharmacy Specialist

## 2013-12-07 VITALS — BP 150/82 | HR 76

## 2013-12-07 DIAGNOSIS — Z7901 Long term (current) use of anticoagulants: Secondary | ICD-10-CM

## 2013-12-07 DIAGNOSIS — I480 Paroxysmal atrial fibrillation: Secondary | ICD-10-CM

## 2013-12-07 DIAGNOSIS — I4891 Unspecified atrial fibrillation: Secondary | ICD-10-CM

## 2013-12-07 LAB — POCT INR: INR: 1.5

## 2013-12-23 ENCOUNTER — Ambulatory Visit (INDEPENDENT_AMBULATORY_CARE_PROVIDER_SITE_OTHER): Payer: Medicare PPO | Admitting: Family Medicine

## 2013-12-23 VITALS — BP 140/90 | HR 73 | Temp 97.8°F | Resp 16 | Ht 64.5 in | Wt 144.0 lb

## 2013-12-23 DIAGNOSIS — H538 Other visual disturbances: Secondary | ICD-10-CM

## 2013-12-23 DIAGNOSIS — R5381 Other malaise: Secondary | ICD-10-CM

## 2013-12-23 DIAGNOSIS — I10 Essential (primary) hypertension: Secondary | ICD-10-CM

## 2013-12-23 DIAGNOSIS — N951 Menopausal and female climacteric states: Secondary | ICD-10-CM

## 2013-12-23 DIAGNOSIS — R232 Flushing: Secondary | ICD-10-CM

## 2013-12-23 DIAGNOSIS — R5383 Other fatigue: Principal | ICD-10-CM

## 2013-12-23 LAB — POCT URINALYSIS DIPSTICK
Bilirubin, UA: NEGATIVE
Glucose, UA: NEGATIVE
Ketones, UA: NEGATIVE
Leukocytes, UA: NEGATIVE
Nitrite, UA: NEGATIVE
Protein, UA: NEGATIVE
Spec Grav, UA: <=1.005
Urobilinogen, UA: 0.2
pH, UA: 5

## 2013-12-23 LAB — CBC
HCT: 38.7 % (ref 36.0–46.0)
Hemoglobin: 12.5 g/dL (ref 12.0–15.0)
MCH: 24.8 pg — ABNORMAL LOW (ref 26.0–34.0)
MCHC: 32.3 g/dL (ref 30.0–36.0)
MCV: 76.8 fL — ABNORMAL LOW (ref 78.0–100.0)
Platelets: 244 10*3/uL (ref 150–400)
RBC: 5.04 MIL/uL (ref 3.87–5.11)
RDW: 15.8 % — ABNORMAL HIGH (ref 11.5–15.5)
WBC: 4.7 10*3/uL (ref 4.0–10.5)

## 2013-12-23 LAB — COMPLETE METABOLIC PANEL WITH GFR
ALT: 15 U/L (ref 0–35)
AST: 18 U/L (ref 0–37)
Alkaline Phosphatase: 62 U/L (ref 39–117)
CO2: 26 mEq/L (ref 19–32)
Calcium: 9.2 mg/dL (ref 8.4–10.5)
Chloride: 102 mEq/L (ref 96–112)
Creat: 0.86 mg/dL (ref 0.50–1.10)
Total Bilirubin: 0.6 mg/dL (ref 0.2–1.2)
Total Protein: 7.1 g/dL (ref 6.0–8.3)

## 2013-12-23 LAB — COMPLETE METABOLIC PANEL WITHOUT GFR
Albumin: 4.1 g/dL (ref 3.5–5.2)
BUN: 11 mg/dL (ref 6–23)
GFR, Est African American: 79 mL/min
GFR, Est Non African American: 69 mL/min
Glucose, Bld: 92 mg/dL (ref 70–99)
Potassium: 3.8 meq/L (ref 3.5–5.3)
Sodium: 141 meq/L (ref 135–145)

## 2013-12-23 LAB — POCT UA - MICROSCOPIC ONLY
Casts, Ur, LPF, POC: NEGATIVE
Crystals, Ur, HPF, POC: NEGATIVE
Epithelial cells, urine per micros: 0.2
Mucus, UA: NEGATIVE
RBC, urine, microscopic: 0.1
WBC, Ur, HPF, POC: NEGATIVE
Yeast, UA: NEGATIVE

## 2013-12-23 LAB — TSH: TSH: 1.307 u[IU]/mL (ref 0.350–4.500)

## 2013-12-23 MED ORDER — FLUTICASONE PROPIONATE 50 MCG/ACT NA SUSP: 2.0000 | Freq: Every evening | NASAL | Status: DC | PRN

## 2013-12-23 NOTE — Progress Notes (Signed)
 Chief Complaint:  Chief Complaint  Patient presents with  . Fatigue  . Hot Flashes  . Blurred Vision  . Urinary Incontinence  . Sinus Problem    HPI: Donna Simmons is a 73 y.o. female with a history of PAF, PVCs  and HTN  on flecainide, metoprolol , lisinopril  and warfarin who is here for generalized fatigue for the last several weeks. She states her PAF is well controlled, however she has has had elevated BP and and frequent urination, she has lost control of her bladder function. She feels like it "hit her all at once."  She was seen here on January 2015 and her lisinopril 20 mg was dropped from 2 tabs to 1 1/2 sicne her BP would drop and she has hypotensive sxs She was dropping down to 100/60 with 2 tablets of the lisinopril, sicne she ahs been on her lisinopril 1.5 tabs she feels less hypotensive.  Dr Herbie BaltimoreHarding  Is her cardiologist, next appt with Dr Herbie BaltimoreHArding 5/24 or 5/27.  Her BP at home ranges from  115/65-70---135/75 However sometimes it fluctautes, then it drops down. IT comes on suddenly, She feels it during both exertion and nonexertion. She notices it most after she eats.   Deneis n/v/abd pain, she denies any CP with exertion. She is an avid walker.  BP has never been higher than 150/90 usually never over 140 Again no history CP, SOB, pedal edema, DOE, PND, n/w/tingling  Past Medical History  Diagnosis Date  . Paroxysmal atrial fibrillation 12/27/2008  . Hypertension   . Hypercholesteremia   . GERD (gastroesophageal reflux disease)   . Palpitations 01/10/2009    14 day monitor- some sinus tachycardia, PVCs   Past Surgical History  Procedure Laterality Date  . Breast surgery    . Fracture surgery    . Abdominal hysterectomy  1980  . Cardiac catheterization  12/16/2010    no evidence of CAD to explain anginal pain w/ positive troponin.  potential etiology is breakthrough AF  . Transthoracic echocardiogram  April 2010    Echo - EF >55; RV mils/mod dilated, RV  systolic function mildly reduced; RA mild/moderately dilated; moderate pulmonary hypertension; mild tricuspid regurgitation  . Nm myoview ltd  April 2010    EF 64%, normal pattern of perfusion in all regions, no scintigraphic evidence of inducible ischemia; no significant wall motion abnormalities; EKG negative for ischemia; no significant change from last study; low risk scan   History   Social History  . Marital Status: Married    Spouse Name: N/A    Number of Children: N/A  . Years of Education: N/A   Social History Main Topics  . Smoking status: Never Smoker   . Smokeless tobacco: Not on file  . Alcohol Use: No  . Drug Use: No  . Sexual Activity: Yes   Other Topics Concern  . Not on file   Social History Narrative   Married with no children. Walks several times a week at least 3-4 times a week about 2 miles a time. Does not smoke and does not drink.   Family History  Problem Relation Age of Onset  . Arthritis Mother     OSTEO  . Hypertension Mother   . Dementia Mother   . Heart disease Father   . Hypertension Sister   . Dementia Sister   . Cancer - Lung Brother   . Cancer Maternal Grandmother   . Heart disease Maternal Grandfather   .  Cancer - Lung Brother   . Hypertension Brother   . Cancer - Prostate Brother    No Known Allergies Prior to Admission medications   Medication Sig Start Date End Date Taking? Authorizing Provider  atorvastatin (LIPITOR) 10 MG tablet Take 10 mg by mouth daily with lunch.   Yes Historical Provider, MD  flecainide (TAMBOCOR) 50 MG tablet take 1 tablet by mouth twice a day   Yes Marykay Lex, MD  fluticasone [facility]) 50 MCG/ACT nasal spray Place 2 sprays into the nose at bedtime as needed (congestion).   Yes Historical Provider, MD  lisinopril (PRINIVIL,ZESTRIL) 10 MG tablet Take 15 mg by mouth daily.  08/26/13  Yes Tonye Pearson, MD  metoprolol succinate (TOPROL-XL) 25 MG 24 hr tablet Take 25 mg by mouth See admin instructions.  Take 1 tablet (25 mg) daily before dinner, may take an additional 1/2 tablet (12.5 mg) 6 hours after initial dose if needed for rapid heart rate or pvc's 10/20/12  Yes Maurice March, MD  Multiple Vitamin (MULTIVITAMIN WITH MINERALS) TABS tablet Take 1 tablet by mouth daily with lunch. Centrum Silver   Yes Historical Provider, MD  omeprazole (PRILOSEC) 20 MG capsule Take 20 mg by mouth daily.    Yes Historical Provider, MD  pseudoephedrine-acetaminophen (TYLENOL SINUS) 30-500 MG TABS Take 1 tablet by mouth 2 (two) times daily as needed (congestion).   Yes Historical Provider, MD  warfarin (COUMADIN) 5 MG tablet Take 2.5-5 mg by mouth daily with lunch. Take 1/2 tablet (2.5 mg) on Monday and Wednesday and Friday, take 1 tablet (5 mg) on all other days   Yes Historical Provider, MD  warfarin (COUMADIN) 5 MG tablet Take 1 tablet by mouth daily or as directed 10/19/13  Yes Phillips Hay, RPH-CPP     ROS: The patient denies fevers, chills, night sweats, unintentional weight loss, chest pain, palpitations, wheezing, dyspnea on exertion, nausea, vomiting, abdominal pain, dysuria, hematuria, melena, numbness,  or tingling.   All other systems have been reviewed and were otherwise negative with the exception of those mentioned in the HPI and as above.    PHYSICAL EXAM: Filed Vitals:   12/23/13 0957  BP: 140/90  Pulse: 73  Temp: 97.8 F (36.6 C)  Resp: 16   Filed Vitals:   12/23/13 0957  Height: 5' 4.5" (1.638 m)  Weight: 144 lb (65.318 kg)   Body mass index is 24.34 kg/(m^2).  General: Alert, no acute distress HEENT:  Normocephalic, atraumatic, oropharynx patent. EOMI, PERRLA, fundoscopic exam normal Cardiovascular:  Regular rate and rhythm, no rubs murmurs or gallops.  No Carotid bruits, radial pulse intact. No pedal edema.  Respiratory: Clear to auscultation bilaterally.  No wheezes, rales, or rhonchi.  No cyanosis, no use of accessory musculature GI: No organomegaly, abdomen is soft  and non-tender, positive bowel sounds.  No masses. Skin: No rashes. Neurologic: Facial musculature symmetric. Psychiatric: Patient is appropriate throughout our interaction. Lymphatic: No cervical lymphadenopathy Musculoskeletal: Gait intact. 5/5 strength, 2/2 DTRs NO CVA tenderness    LABS: Results for orders placed in visit on 12/23/13  POCT URINALYSIS DIPSTICK      Result Value Ref Range   Color, UA yellow     Clarity, UA clear     Glucose, UA neg     Bilirubin, UA neg     Ketones, UA neg     Spec Grav, UA <=1.005     Blood, UA mod     pH, UA 5.0  Protein, UA neg     Urobilinogen, UA 0.2     Nitrite, UA neg     ukocytes, UA Negative    POCT UA - MICROSCOPIC ONLY      Result Value Ref Range   WBC, Ur, HPF, POC neg     RBC, urine, microscopic 0.1     Bacteria, U Microscopic trace     Mucus, UA neg     Epithelial cells, urine per micros 0.2     Crystals, Ur, HPF, POC neg     Casts, Ur, LPF, POC neg     Yeast, UA neg       EKG/XRAY:   [facility]   ASSESSMENT/PLAN: Encounter Diagnoses  Name Primary?  . Other malaise and fatigue Yes  . Hot flashes   . Blurred vision   . HTN (hypertension)     Donna Simmons is a very pleasant, young and well appearing 73 y/o AA female with a history of PAF in NSR, PVCs, HTN who is here for generalized fatigue, hot flashes.  I am not sure the cause of her generalized fatigue.  Her VSS are normal and she does not have any CP/SOB/DOE or CHF sxs so I am less inclined to say it is cardiac related, however she maybe throwing PVCs and or going in/out of Afib but she does not think so. Already on optimized treatment for PAF She is post menopausal, s/p hysterectomy. Her urine did not show infection but did show blood, I will ask her to return in 1 week to get hematuria rechecked. If still + for blood then need referral to urology.  She denies any vaginal bleeding.  Naphcon vs Zapidor otc for allergies  in her eyes, advise to see optho/optometry to check for cataracts/glaucoma.  Labs pending: CMP , TSH See cardiology as scheduled F/u prn  Gross sideeffects, risk and benefits, and alternatives of medications d/w patient. Patient is aware that all medications have potential sideeffects and we are unable to predict every sideeffect or drug-drug interaction that may occur.  nell Antuhao P , DO 12/23/2013 12:08 PM

## 2013-12-28 ENCOUNTER — Ambulatory Visit: Payer: Medicare PPO | Admitting: Pharmacist Clinician (PhC)/ Clinical Pharmacy Specialist

## 2014-01-02 ENCOUNTER — Telehealth: Payer: Self-pay | Admitting: Pharmacist Clinician (PhC)/ Clinical Pharmacy Specialist

## 2014-01-06 ENCOUNTER — Encounter (HOSPITAL_COMMUNITY): Payer: Self-pay | Admitting: Emergency Medicine

## 2014-01-06 ENCOUNTER — Ambulatory Visit: Payer: Medicare PPO | Admitting: Family Medicine

## 2014-01-06 ENCOUNTER — Emergency Department (HOSPITAL_COMMUNITY)
Admission: EM | Admit: 2014-01-06 | Discharge: 2014-01-06 | Disposition: A | Discharge status: Home / Self Care | Payer: Medicare PPO | Attending: Emergency Medicine | Admitting: Emergency Medicine

## 2014-01-06 DIAGNOSIS — E7800 Pure hypercholesterolemia, unspecified: Secondary | ICD-10-CM | POA: Insufficient documentation

## 2014-01-06 DIAGNOSIS — I4891 Unspecified atrial fibrillation: Secondary | ICD-10-CM | POA: Insufficient documentation

## 2014-01-06 DIAGNOSIS — R42 Dizziness and giddiness: Secondary | ICD-10-CM | POA: Insufficient documentation

## 2014-01-06 DIAGNOSIS — R232 Flushing: Secondary | ICD-10-CM | POA: Insufficient documentation

## 2014-01-06 DIAGNOSIS — Z9889 Other specified postprocedural states: Secondary | ICD-10-CM | POA: Insufficient documentation

## 2014-01-06 DIAGNOSIS — K219 Gastro-esophageal reflux disease without esophagitis: Secondary | ICD-10-CM | POA: Insufficient documentation

## 2014-01-06 DIAGNOSIS — R5381 Other malaise: Secondary | ICD-10-CM | POA: Insufficient documentation

## 2014-01-06 DIAGNOSIS — R002 Palpitations: Secondary | ICD-10-CM | POA: Insufficient documentation

## 2014-01-06 DIAGNOSIS — Z780 Asymptomatic menopausal state: Secondary | ICD-10-CM | POA: Insufficient documentation

## 2014-01-06 DIAGNOSIS — Z7901 Long term (current) use of anticoagulants: Secondary | ICD-10-CM | POA: Insufficient documentation

## 2014-01-06 DIAGNOSIS — Z90710 Acquired absence of both cervix and uterus: Secondary | ICD-10-CM | POA: Insufficient documentation

## 2014-01-06 DIAGNOSIS — Z79899 Other long term (current) drug therapy: Secondary | ICD-10-CM | POA: Insufficient documentation

## 2014-01-06 DIAGNOSIS — I10 Essential (primary) hypertension: Secondary | ICD-10-CM | POA: Insufficient documentation

## 2014-01-06 DIAGNOSIS — R5383 Other fatigue: Secondary | ICD-10-CM

## 2014-01-06 DIAGNOSIS — R3915 Urgency of urination: Secondary | ICD-10-CM | POA: Insufficient documentation

## 2014-01-06 DIAGNOSIS — R350 Frequency of micturition: Secondary | ICD-10-CM | POA: Insufficient documentation

## 2014-01-06 LAB — PROTIME-INR
INR: 2.69 — AB (ref 0.00–1.49)
Prothrombin Time: 27.7 seconds — ABNORMAL HIGH (ref 11.6–15.2)

## 2014-01-06 LAB — URINE MICROSCOPIC-ADD ON

## 2014-01-06 LAB — URINALYSIS, ROUTINE W REFLEX MICROSCOPIC
Bilirubin Urine: NEGATIVE
Glucose, UA: NEGATIVE mg/dL
Ketones, ur: 15 mg/dL — AB
Leukocytes, UA: NEGATIVE
NITRITE: NEGATIVE
PH: 7 (ref 5.0–8.0)
Protein, ur: NEGATIVE mg/dL
SPECIFIC GRAVITY, URINE: 1.012 (ref 1.005–1.030)
UROBILINOGEN UA: 1 mg/dL (ref 0.0–1.0)

## 2014-01-06 LAB — TROPONIN I

## 2014-01-06 MED ORDER — GI COCKTAIL ~~LOC~~
30.0000 mL | Freq: Once | ORAL | Status: AC
  Administered 2014-01-06: 30 mL via ORAL
  Filled 2014-01-06: qty 30

## 2014-01-06 NOTE — Discharge Instructions (Signed)
It was a pleasure taking care of you. - We are not sure why you're having episodes of high blood pressure, flushing, and urine problems. It is possible you're having short episodes of atrial fibrillation or PVCs. You're already on the right medicines for these problems. - It is unlikely you are having a serious condition like a heart attack, kidney problems, liver problems, thyroid problems as your blood work was normal. - Here in the ER we have ruled out a urine infection. You do have a little bit of blood in your urine. Please talk your primary care doctor about referral to urologist. - If you develop very high blood pressure that lasts for more than a few minutes, chest pain, shortness of breath, headache, confusion please return to the ED.

## 2014-01-06 NOTE — Telephone Encounter (Signed)
Closed encounter °

## 2014-01-06 NOTE — ED Notes (Signed)
Pt here from home with c/o weakness that started this morning , also has htn , she had already taken her morning meds ,

## 2014-01-06 NOTE — ED Provider Notes (Signed)
I saw and evaluated the patient, reviewed the resident's note and I agree with the findings and plan.   EKG Interpretation   Date/Time:  Friday Jan 06 2014 10:21:04 EDT Ventricular Rate:  61 PR Interval:  176 QRS Duration: 87 QT Interval:  413 QTC Calculation: 416 R Axis:   12 Text Interpretation:  Sinus rhythm Normal ECG No significant change since  last tracing Confirmed by [facility]  [facility], Jonny RuizJOHN ([id number]) on 01/06/2014 11:25:52 AM     D/w Pt who is aware we doubt HTN crisis.  Hurman HornJohn M Aeneas Longsworth, MD 01/11/14 2130

## 2014-01-06 NOTE — ED Provider Notes (Signed)
CSN: [id number]     Arrival date & time 01/06/14  1016 History   None    Chief Complaint  Patient presents with  . Weakness  . Hypertension     (Consider location/radiation/quality/duration/timing/severity/associated sxs/prior Treatment) HPI  Kathaleen MaserMary P Thieme is a 73 y.o. female with PMH PAF on Coumadin, PVCs, HTN who presents with a chief complaint of elevated blood pressures.  Patient reports for the past several weeks she has had intermittently elevated blood pressures associated with "hot flashes" and lightheadedness. She describes the "hot flash" as a sensation of warmth travelling from her abdomen to her head. When it occurs, she uses her blood pressure cuff, and readings are typically 160-170s systolic. After resolution of symptoms in 2-3 minutes, she will recheck her blood pressure and it will usually be back to normal, 110 to 130s systolic. The episodes are also associated with urinary frequency and fast heart rate. Denies chest pain, shortness of breath, fever, chills. Cannot pinpoint an association between any of her medicines. This happens 2-3 times per day and often is associated with meals. She sometimes has epigastric "GERD pain" with it. She is post menopausal, s/p hysterectomy. Denies vaginal bleeding.  She's been seeing her PCP and her cardiologist about her hypertension and they have recently adjusted her meds. She is taking more lisinopril and it sounds like her diuretic was discontinued due to hypokalemia. She presents today because the symptoms recur this morning and her blood pressure when measured at home with her cuff was 220s systolic.  She has a family history of high blood pressure but no personal or family history of kidney disease or adrenal gland problems. She had an MR of her abdomen in 2010 which showed right hepatic lobe hemangioma, normal adrenal glands.   Past Medical History  Diagnosis Date  . Paroxysmal atrial fibrillation 12/27/2008  . Hypertension   .  Hypercholesteremia   . GERD (gastroesophageal reflux disease)   . Palpitations 01/10/2009    14 day monitor- some sinus tachycardia, PVCs   Past Surgical History  Procedure Laterality Date  . Breast surgery    . Fracture surgery    . Abdominal hysterectomy  1980  . Cardiac catheterization  12/16/2010    no evidence of CAD to explain anginal pain w/ positive troponin.  potential etiology is breakthrough AF  . Transthoracic echocardiogram  April 2010    Echo - EF >55; RV mils/mod dilated, RV systolic function mildly reduced; RA mild/moderately dilated; moderate pulmonary hypertension; mild tricuspid regurgitation  . Nm myoview ltd  April 2010    EF 64%, normal pattern of perfusion in all regions, no scintigraphic evidence of inducible ischemia; no significant wall motion abnormalities; EKG negative for ischemia; no significant change from last study; low risk scan   Family History  Problem Relation Age of Onset  . Arthritis Mother     OSTEO  . Hypertension Mother   . Dementia Mother   . Heart disease Father   . Hypertension Sister   . Dementia Sister   . Cancer - Lung Brother   . Cancer Maternal Grandmother   . Heart disease Maternal Grandfather   . Cancer - Lung Brother   . Hypertension Brother   . Cancer - Prostate Brother    History  Substance Use Topics  . Smoking status: Never Smoker   . Smokeless tobacco: Not on file  . Alcohol Use: No   OB History   Grav Para Term Preterm Abortions TAB SAB Ect Mult Living  Review of Systems  Constitutional: Negative for fever and chills.  Respiratory: Negative for cough and shortness of breath.   Cardiovascular: Positive for palpitations ("fast heart rate" with episodes). Negative for chest pain and leg swelling.  Genitourinary: Positive for urgency and frequency. Negative for vaginal bleeding.  Neurological: Positive for light-headedness. Negative for syncope.     Allergies  Review of patient's allergies  indicates no known allergies.  Home Medications   Prior to Admission medications   Medication Sig Start Date End Date Taking? Authorizing Provider  atorvastatin (LIPITOR) 10 MG tablet Take 10 mg by mouth daily with lunch.    Historical Provider, MD  flecainide (TAMBOCOR) 50 MG tablet take 1 tablet by mouth twice a day    Marykay Lex, MD  fluticasone [facility]) 50 MCG/ACT nasal spray Place 2 sprays into both nostrils at bedtime as needed (congestion). 12/23/13   Thao P Le, DO  lisinopril (PRINIVIL,ZESTRIL) 10 MG tablet Take 15 mg by mouth daily.  08/26/13   Tonye Pearson, MD  metoprolol succinate (TOPROL-XL) 25 MG 24 hr tablet Take 25 mg by mouth See admin instructions. Take 1 tablet (25 mg) daily before dinner, may take an additional 1/2 tablet (12.5 mg) 6 hours after initial dose if needed for rapid heart rate or pvc's 10/20/12   Maurice March, MD  Multiple Vitamin (MULTIVITAMIN WITH MINERALS) TABS tablet Take 1 tablet by mouth daily with lunch. Centrum Silver    Historical Provider, MD  omeprazole (PRILOSEC) 20 MG capsule Take 20 mg by mouth daily.     Historical Provider, MD  pseudoephedrine-acetaminophen (TYLENOL SINUS) 30-500 MG TABS Take 1 tablet by mouth 2 (two) times daily as needed (congestion).    Historical Provider, MD  warfarin (COUMADIN) 5 MG tablet Take 2.5-5 mg by mouth daily with lunch. Take 1/2 tablet (2.5 mg) on Monday and Wednesday and Friday, take 1 tablet (5 mg) on all other days    Historical Provider, MD  warfarin (COUMADIN) 5 MG tablet Take 1 tablet by mouth daily or as directed 10/19/13   Phillips Hay, RPH-CPP   BP 146/69  Pulse 67  Temp(Src) 98.4 F (36.9 C) (Oral)  Resp 16  SpO2 98% Physical Exam  Constitutional: She is oriented to person, place, and time. She appears well-developed and well-nourished.  Pleasant, conversant, appears younger than stated age  HENT:  Head: Normocephalic and atraumatic.  Eyes: Conjunctivae and EOM are normal. Pupils  are equal, round, and reactive to light.  Neck: Normal range of motion. Neck supple.  Cardiovascular: Normal rate, regular rhythm and normal heart sounds.  Exam reveals no gallop and no friction rub.   No murmur heard. Pulmonary/Chest: Effort normal and breath sounds normal. No respiratory distress. She has no wheezes. She has no rales. She exhibits no tenderness.  Abdominal: Soft. Bowel sounds are normal. She exhibits no distension. There is no tenderness.  Musculoskeletal: Normal range of motion. She exhibits no edema and no tenderness.  Neurological: She is alert and oriented to person, place, and time. No cranial nerve deficit.  Skin: Skin is warm and dry.  Psychiatric: She has a normal mood and affect.    ED Course  Procedures (including critical care time) Labs Review Labs Reviewed  URINALYSIS, ROUTINE W REFLEX MICROSCOPIC - Abnormal; Notable for the following:    Hgb urine dipstick MODERATE (*)    Ketones, ur 15 (*)    All other components within normal limits  PROTIME-INR - Abnormal; Notable for the following:  Prothrombin Time 27.7 (*)    INR 2.69 (*)    All other components within normal limits  TROPONIN I  URINE MICROSCOPIC-ADD ON    Imaging Review No results found.   EKG Interpretation   Date/Time:  Friday Jan 06 2014 10:21:04 EDT Ventricular Rate:  61 PR Interval:  176 QRS Duration: 87 QT Interval:  413 QTC Calculation: 416 R Axis:   12 Text Interpretation:  Sinus rhythm Normal ECG No significant change since  last tracing Confirmed by [facility], Jonny RuizJOHN ([id number]) on 01/06/2014 11:25:52 AM     NSR  MDM   Final diagnoses:  Episode of hypertension  Flushing  Urinary frequency    Kathaleen MaserMary P Jares is a 73 y.o. female with PMH PAF on Coumadin, PVCs, HTN who presents with a chief complaint of episodes of elevated blood pressure, lightheadedness, fast heart rate, flushing, and urinary frequency.  She was seen in urgent care on 12/23/2013 for the same problem.  CMP was normal. CBC normal. TSH normal. Dipstick UA had some moderate hematuria. She was advised to repeat this lab in 1 week and ask for referral to urology if positive for blood. Will will check UA and monitor the patient on telemetry for recurrent episodes. Her symptoms may be 2/2 to paroxysmal A. fib versus PVCs. Will provide GI cocktail as patient seems to associate symptoms with meals.  Troponin checked and negative. UA still shows 3-6 reds. No leukocytes or nitrites. INR is therapeutic at 2.69. Recommend PCP followup and suggest urology referral. Patient discharged home in stable condition.   Vivi BarrackSarah Jazmon Kos, MD 01/06/14 [phone]

## 2014-01-09 ENCOUNTER — Ambulatory Visit: Payer: Medicare PPO | Admitting: Pharmacist Clinician (PhC)/ Clinical Pharmacy Specialist

## 2014-01-18 ENCOUNTER — Ambulatory Visit (INDEPENDENT_AMBULATORY_CARE_PROVIDER_SITE_OTHER): Payer: Medicare PPO | Admitting: Cardiology

## 2014-01-18 ENCOUNTER — Encounter: Payer: Self-pay | Admitting: Cardiology

## 2014-01-18 VITALS — BP 162/94 | HR 62 | Ht 64.0 in | Wt 142.2 lb

## 2014-01-18 DIAGNOSIS — I480 Paroxysmal atrial fibrillation: Secondary | ICD-10-CM

## 2014-01-18 DIAGNOSIS — I10 Essential (primary) hypertension: Secondary | ICD-10-CM

## 2014-01-18 DIAGNOSIS — I4891 Unspecified atrial fibrillation: Secondary | ICD-10-CM

## 2014-01-18 DIAGNOSIS — R002 Palpitations: Secondary | ICD-10-CM

## 2014-01-18 DIAGNOSIS — E7800 Pure hypercholesterolemia, unspecified: Secondary | ICD-10-CM

## 2014-01-18 MED ORDER — LISINOPRIL 5 MG PO TABS: 5.0000 mg | ORAL_TABLET | Freq: Every day | ORAL | Status: DC

## 2014-01-18 NOTE — Patient Instructions (Addendum)
TAKE 5 MG  LISINOPRIL IN THE EVENING.  DO NOT USE PSEUDOEPHEDRINE- MAY BE CAUSING IRREGULAR HEART RATE  USE YOUR BETA BLOCKER AS NEEDED FOR RAPID HEART RATE  USE FLECAIDE IF YOU DEVELOP  A FIB.  Your physician recommends that you schedule a follow-up appointment WITH AN EXTENDER IN 1 MONTH   Your physician wants you to follow-up in 4 MONTHS Dr Herbie BaltimoreHarding. You will receive a reminder letter in the mail two months in advance. If you don't receive a letter, please call our office to schedule the follow-up appointment.   4

## 2014-01-18 NOTE — Progress Notes (Signed)
PCP: Dow AdolphMCPHERSON,BARBARA, MD  Clinic Note: Chief Complaint  Patient presents with  . Follow-up    6 months follow-up, pt went to the hospital because of increase BP was 225/104    HPI: Donna Simmons is a 73 y.o. female with a Cardiovascular Problem List below who presents today for hypertension, PAF and frequent PACs/PVCs. I last saw her back in November 2014. She is doing relatively well no major changes were made.  Interval History: She says that she's been doing pretty well up until the last few weeks. About a month ago she went to the emergency room for an episode preceded by GERD type symptoms where she felt hot flushed and dizzy. She had a somewhat of a headache and felt very racy heartbeat. The fluttering lately last a few seconds at a time but is persistently coming in the coming and coming out. She went immersion was on a blood pressure of 225/103 mmHg. She describes it as a hot flash-type sensation going from her abdomen her head. Usually her blood pressures are not as high as they were this time. Apparently her diuretic was discontinued due to hypokalemia. Her lisinopril dose was increased. She says BSO she filling or not at all associated with her similar to the symptoms she noted when she was in A. fib with RVR. She says that palpation is a much more regular than when she is in A. fib. As it turns out, she had been having an issue with some cold and sinus type symptoms and had been taking some Tylenol Cold which has pseudoephedrine in it. Since the ER visit, she tell me that her blood pressures have been relatively labile going from the 150 range down from 1 teens and 120s within 15-20 minutes of the initial reading. Occasionally she feels as she somewhat woozy. Only 1-7 of rapid heartbeat this stable chest discomfort. For the most part she is doing relatively well otherwise.  No chest pain or shortness of breath with rest or exertion.  No PND, orthopnea or edema.  No syncope/near  syncope.  No TIA/amaurosis fugax symptoms. No melena, hematochezia, hematuria, or epstaxis. No claudication.  Past Medical History  Diagnosis Date  . Paroxysmal atrial fibrillation 12/27/2008  . Hypertension   . Hypercholesteremia   . GERD (gastroesophageal reflux disease)   . Palpitations 01/10/2009    14 day monitor- some sinus tachycardia, PVCs    Prior Cardiac Evaluation and Past Surgical History: Past Surgical History  Procedure Laterality Date  . Breast surgery    . Fracture surgery    . Abdominal hysterectomy  1980  . Cardiac catheterization  12/16/2010    no evidence of CAD to explain anginal pain w/ positive troponin.  potential etiology is breakthrough AF  . Transthoracic echocardiogram  April 2010    Echo - EF >55; RV mils/mod dilated, RV systolic function mildly reduced; RA mild/moderately dilated; moderate pulmonary hypertension; mild tricuspid regurgitation  . Nm myoview ltd  April 2010    EF 64%, normal pattern of perfusion in all regions, no scintigraphic evidence of inducible ischemia; no significant wall motion abnormalities; EKG negative for ischemia; no significant change from last study; low risk scan    MEDICATIONS AND ALLERGIES REVIEWED IN EPIC No Change in Social and Family History  ROS: A comprehensive Review of Systems - Negative except Symptoms noted above.  PHYSICAL EXAM BP 162/94  Pulse 62  Ht 5\' 4"  (1.626 m)  Wt 142 lb 3.2 oz (64.501 kg)  BMI  24.40 kg/m2 Gen.: She is oriented to person, place, and time. She appears well-developed and well-nourished. normal mood and affect Pleasant, conversant, appears younger than stated age  HEENT: [place]/AT, EOMI, MMM, anicteric sclera; Eyes: Conjunctivae and EOM are normal. Pupils are equal, round, and reactive to light.  Neck: Normal range of motion. Neck supple.  No JVD or bruit Cardiovascular: RRR, normal S1-S2. No M./R./G. Nondisplaced PMI. Pulses 2+ equal bilaterally. Pulmonary/Chest: Nonlabored, good air  movement. CT AB, no W./R./R. Abdominal: Soft/NT/ND/NABS/ No HSM. Neurological: She is alert and oriented to person, place, and time. No cranial nerve deficit.    Adult ECG Report  Rate:  62  ;  Rhythm: normal sinus rhythm and With nonspecific ST-T wave changes. Normal axis and intervals. Normal voltage; stable EKG  Recent Labs From ER reviewed.  Last lipids were from November 2014. TC 200, TG 99, HDL 75 LDL 105. ; Pretty much a goal for her.   ASSESSMENT / PLAN: HTN (hypertension) Pressure is high today, and otherwise has been, labile up and down. Would have a symptomatic state swings at our target range should be somewhere between 120-160 mHg SBP.  For additional on beta coverage at nighttime, and an additional 5 mg lisinopril for evening dose so significant in the morning 5 at night.  Paroxysmal atrial fibrillation Relatively stable without any systematic recurrence.  We talked about she has an episode where she feels she's in A. fib, she should take the extra half dose of metoprolol as well as an extra dose of flecainide to see if that will break the rhythm.  Palpitations I think probably what she is feeling when she is not in A. fib but feels her heart going fast as if she had sinus tachycardia with some PVCs. The axilla to the emergent probably a Sunday with taking Sudafed, but otherwise I think she has labile pressures. We talked about using the when necessary dose of Toprol for rapid heartbeat was somewhat elevated blood pressures.  Hypercholesteremia Relatively well controlled on current regimen. No changes required. I think i her labs are being followed by her PCP.    Orders Placed This Encounter  Procedures  . EKG 12-Lead   Meds ordered this encounter  Medications  . lisinopril (PRINIVIL,ZESTRIL) 5 MG tablet    Sig: Take 1 tablet (5 mg total) by mouth daily.    Dispense:  30 tablet    Refill:  3    THIS IS AN ADDITIONAL TO 10 MG    Followup:  4  months  DAVID W.  Herbie BaltimoreHARDING, M.D., M.S. Interventional Cardiologist [facility]

## 2014-01-18 NOTE — Assessment & Plan Note (Signed)
Relatively well controlled on current regimen. No changes required. I think i her labs are being followed by her PCP.

## 2014-01-18 NOTE — Assessment & Plan Note (Signed)
Pressure is high today, and otherwise has been, labile up and down. Would have a symptomatic state swings at our target range should be somewhere between 120-160 mHg SBP.  For additional on beta coverage at nighttime, and an additional 5 mg lisinopril for evening dose so significant in the morning 5 at night.

## 2014-01-18 NOTE — Assessment & Plan Note (Signed)
I think probably what she is feeling when she is not in A. fib but feels her heart going fast as if she had sinus tachycardia with some PVCs. The axilla to the emergent probably a Sunday with taking Sudafed, but otherwise I think she has labile pressures. We talked about using the when necessary dose of Toprol for rapid heartbeat was somewhat elevated blood pressures.

## 2014-01-18 NOTE — Assessment & Plan Note (Signed)
Relatively stable without any systematic recurrence.  We talked about she has an episode where she feels she's in A. fib, she should take the extra half dose of metoprolol as well as an extra dose of flecainide to see if that will break the rhythm.

## 2014-02-01 ENCOUNTER — Ambulatory Visit: Payer: Medicare PPO | Admitting: Pharmacist Clinician (PhC)/ Clinical Pharmacy Specialist

## 2014-02-06 ENCOUNTER — Ambulatory Visit (INDEPENDENT_AMBULATORY_CARE_PROVIDER_SITE_OTHER): Payer: Medicare PPO | Admitting: Pharmacist Clinician (PhC)/ Clinical Pharmacy Specialist

## 2014-02-06 DIAGNOSIS — I4891 Unspecified atrial fibrillation: Secondary | ICD-10-CM

## 2014-02-06 DIAGNOSIS — Z7901 Long term (current) use of anticoagulants: Secondary | ICD-10-CM

## 2014-02-06 DIAGNOSIS — I480 Paroxysmal atrial fibrillation: Secondary | ICD-10-CM

## 2014-02-06 LAB — POCT INR: INR: 3.7

## 2014-02-22 ENCOUNTER — Ambulatory Visit (INDEPENDENT_AMBULATORY_CARE_PROVIDER_SITE_OTHER): Payer: Medicare PPO | Admitting: Pharmacist Clinician (PhC)/ Clinical Pharmacy Specialist

## 2014-02-22 ENCOUNTER — Ambulatory Visit (INDEPENDENT_AMBULATORY_CARE_PROVIDER_SITE_OTHER): Payer: Medicare PPO | Admitting: Cardiology

## 2014-02-22 ENCOUNTER — Encounter: Payer: Self-pay | Admitting: Cardiology

## 2014-02-22 VITALS — BP 153/88 | HR 85 | Ht 64.0 in | Wt 144.0 lb

## 2014-02-22 DIAGNOSIS — I10 Essential (primary) hypertension: Secondary | ICD-10-CM

## 2014-02-22 DIAGNOSIS — I480 Paroxysmal atrial fibrillation: Secondary | ICD-10-CM

## 2014-02-22 DIAGNOSIS — I4891 Unspecified atrial fibrillation: Secondary | ICD-10-CM

## 2014-02-22 DIAGNOSIS — Z7901 Long term (current) use of anticoagulants: Secondary | ICD-10-CM

## 2014-02-22 LAB — POCT INR: INR: 2.8

## 2014-02-22 NOTE — Patient Instructions (Signed)
Keep up the good work, call if despite taking the 5 mg lisinopril in the evening your BP is still elevated call us.  Follow up with Dr. Herbie BaltimoreHarding in 6 months.

## 2014-02-22 NOTE — Progress Notes (Signed)
02/26/2014   PCP: Dow AdolphMCPHERSON,BARBARA, MD   Chief Complaint  Patient presents with  . Follow-up    1 month visit    Primary Cardiologist:Dr. Ranae Palms. Harding   HPI: 73 year old female presents for followup of hypertension after visit with Dr. Herbie BaltimoreHarding one month ago.    Today she has no chest pain or shortness of breath she does have reflux symptoms at times. Denies palpitations except for perhaps one episode of rapid heart rate lasting 3-4 seconds. Her blood pressure did drop as low as 100/62 she has decreased Lisinopril by not taking the 5 mg in the pm.  She continues the lisinopril 10 mg in the morning.  She states she actually feels quite well.  No Known Allergies  Current Outpatient Prescriptions  Medication Sig Dispense Refill  . atorvastatin (LIPITOR) 10 MG tablet Take 10 mg by mouth daily with lunch.      . flecainide (TAMBOCOR) 50 MG tablet Take 50 mg by mouth 2 (two) times daily.      Marland Kitchen. lisinopril (PRINIVIL,ZESTRIL) 10 MG tablet Take 10 mg by mouth daily.       Marland Kitchen. lisinopril (PRINIVIL,ZESTRIL) 5 MG tablet Take 1 tablet (5 mg total) by mouth daily.  30 tablet  3  . metoprolol succinate (TOPROL-XL) 25 MG 24 hr tablet Take 12.5-25 mg by mouth See admin instructions. Take 1 tablet (25 mg) daily before dinner, may take an additional 1/2 tablet (12.5 mg) 6 hours after initial dose if needed for rapid heart rate or pvc's      . Multiple Vitamin (MULTIVITAMIN WITH MINERALS) TABS tablet Take 1 tablet by mouth daily with lunch. Centrum Silver      . omeprazole (PRILOSEC) 20 MG capsule Take 20 mg by mouth daily.       Marland Kitchen. warfarin (COUMADIN) 5 MG tablet Take 2.5-5 mg by mouth daily with lunch. Take 1/2 tablet (2.5 mg) on Monday and Friday, take 1 tablet (5 mg) on all other days.       No current facility-administered medications for this visit.    Past Medical History  Diagnosis Date  . Paroxysmal atrial fibrillation 12/27/2008  . Hypertension   . Hypercholesteremia   . GERD  (gastroesophageal reflux disease)   . Palpitations 01/10/2009    14 day monitor- some sinus tachycardia, PVCs    Past Surgical History  Procedure Laterality Date  . Breast surgery    . Fracture surgery    . Abdominal hysterectomy  1980  . Cardiac catheterization  12/16/2010    no evidence of CAD to explain anginal pain w/ positive troponin.  potential etiology is breakthrough AF  . Transthoracic echocardiogram  April 2010    Echo - EF >55; RV mils/mod dilated, RV systolic function mildly reduced; RA mild/moderately dilated; moderate pulmonary hypertension; mild tricuspid regurgitation  . Nm myoview ltd  April 2010    EF 64%, normal pattern of perfusion in all regions, no scintigraphic evidence of inducible ischemia; no significant wall motion abnormalities; EKG negative for ischemia; no significant change from last study; low risk scan    ZOX:[id number]:UJROS:General:no colds or fevers, no weight changes Skin:no rashes or ulcers HEENT:no blurred vision, no congestion CV:see HPI PUL:see HPI GI:no diarrhea constipation or melena, no indigestion GU:no hematuria, no dysuria MS:no joint pain, no claudication Neuro:no syncope, no lightheadedness Endo:no diabetes, no thyroid disease  Wt Readings from Last 3 Encounters:  02/22/14 144 lb (65.318 kg)  01/18/14 142 lb 3.2 oz (  64.501 kg)  12/23/13 144 lb (65.318 kg)    PHYSICAL EXAM BP 153/88  Pulse 85  Ht 5\' 4"  (1.626 m)  Wt 144 lb (65.318 kg)  BMI 24.71 kg/m2 General:Pleasant affect, NAD Skin:Warm and dry, brisk capillary refill HEENT:normocephalic, sclera clear, mucus membranes moist Neck:supple, no JVD, no bruits  Heart:S1S2 RRR without murmur, gallup, rub or click Lungs:clear without rales, rhonchi, or wheezes ZOX:WRUEAbd:soft, non tender, + BS, do not palpate liver spleen or masses Ext:no lower ext edema, 2+ pedal pulses, 2+ radial pulses Neuro:alert and oriented, MAE, follows commands, + facial symmetry   ASSESSMENT AND PLAN HTN  (hypertension) Blood pressure mildly elevated during this visit but if she takes the lisinopril 5 mg in the evening it becomes very low and she feels weak and tired therefore she'll continue 10 mg in the morning and take the 5 mg in the evening as needed for blood pressure greater than 135.  She was agreeable.  Paroxysmal atrial fibrillation One 3-4 second episode of tachycardia since her last visit

## 2014-02-26 NOTE — Assessment & Plan Note (Signed)
Blood pressure mildly elevated during this visit but if she takes the lisinopril 5 mg in the evening it becomes very low and she feels weak and tired therefore she'll continue 10 mg in the morning and take the 5 mg in the evening as needed for blood pressure greater than 135.  She was agreeable.

## 2014-02-26 NOTE — Assessment & Plan Note (Signed)
One 3-4 second episode of tachycardia since her last visit

## 2014-03-21 ENCOUNTER — Encounter: Payer: Medicare PPO | Admitting: Family Medicine

## 2014-03-22 ENCOUNTER — Ambulatory Visit: Payer: Medicare PPO | Admitting: Pharmacist Clinician (PhC)/ Clinical Pharmacy Specialist

## 2014-03-23 ENCOUNTER — Encounter: Payer: Self-pay | Admitting: Family Medicine

## 2014-04-21 ENCOUNTER — Ambulatory Visit (INDEPENDENT_AMBULATORY_CARE_PROVIDER_SITE_OTHER): Payer: Medicare PPO | Admitting: Pharmacist Clinician (PhC)/ Clinical Pharmacy Specialist

## 2014-04-21 DIAGNOSIS — I480 Paroxysmal atrial fibrillation: Secondary | ICD-10-CM

## 2014-04-21 DIAGNOSIS — Z7901 Long term (current) use of anticoagulants: Secondary | ICD-10-CM

## 2014-04-21 DIAGNOSIS — I4891 Unspecified atrial fibrillation: Secondary | ICD-10-CM

## 2014-04-21 LAB — POCT INR: INR: 1.7

## 2014-04-28 ENCOUNTER — Ambulatory Visit: Payer: Medicare PPO | Admitting: Family Medicine

## 2014-05-12 ENCOUNTER — Ambulatory Visit: Payer: Medicare PPO | Admitting: Pharmacist Clinician (PhC)/ Clinical Pharmacy Specialist

## 2014-05-15 ENCOUNTER — Ambulatory Visit: Payer: Medicare PPO | Admitting: Cardiology

## 2014-05-15 ENCOUNTER — Ambulatory Visit: Payer: Medicare PPO | Admitting: Pharmacist Clinician (PhC)/ Clinical Pharmacy Specialist

## 2014-05-19 ENCOUNTER — Ambulatory Visit: Payer: Medicare PPO | Admitting: Pharmacist Clinician (PhC)/ Clinical Pharmacy Specialist

## 2014-05-22 ENCOUNTER — Ambulatory Visit (INDEPENDENT_AMBULATORY_CARE_PROVIDER_SITE_OTHER): Payer: Medicare PPO | Admitting: Pharmacist Clinician (PhC)/ Clinical Pharmacy Specialist

## 2014-05-22 DIAGNOSIS — I480 Paroxysmal atrial fibrillation: Secondary | ICD-10-CM

## 2014-05-22 DIAGNOSIS — I4891 Unspecified atrial fibrillation: Secondary | ICD-10-CM

## 2014-05-22 DIAGNOSIS — Z7901 Long term (current) use of anticoagulants: Secondary | ICD-10-CM

## 2014-05-22 LAB — POCT INR: INR: 1.8

## 2014-05-22 MED ORDER — ATORVASTATIN CALCIUM 10 MG PO TABS: 10.0000 mg | ORAL_TABLET | Freq: Every day | ORAL | Status: DC

## 2014-06-06 ENCOUNTER — Telehealth: Payer: Self-pay | Admitting: Cardiology

## 2014-06-06 MED ORDER — FLECAINIDE ACETATE 50 MG PO TABS: 50.0000 mg | ORAL_TABLET | Freq: Two times a day (BID) | ORAL | Status: DC

## 2014-06-06 NOTE — Telephone Encounter (Signed)
Need [facility] on [facility] 50 mg [facility]180. [phone].

## 2014-06-06 NOTE — Telephone Encounter (Signed)
E SENT TO PHARMACY 

## 2014-06-12 ENCOUNTER — Ambulatory Visit: Payer: Medicare PPO | Admitting: Pharmacist Clinician (PhC)/ Clinical Pharmacy Specialist

## 2014-06-16 ENCOUNTER — Other Ambulatory Visit: Payer: Self-pay

## 2014-07-21 ENCOUNTER — Encounter: Payer: Self-pay | Admitting: Family Medicine

## 2014-07-21 ENCOUNTER — Ambulatory Visit (INDEPENDENT_AMBULATORY_CARE_PROVIDER_SITE_OTHER): Payer: Medicare PPO | Admitting: Family Medicine

## 2014-07-21 VITALS — BP 149/85 | HR 79 | Temp 97.5°F | Resp 18 | Wt 141.0 lb

## 2014-07-21 DIAGNOSIS — Z0000 Encounter for general adult medical examination without abnormal findings: Secondary | ICD-10-CM

## 2014-07-21 DIAGNOSIS — E7800 Pure hypercholesterolemia, unspecified: Secondary | ICD-10-CM

## 2014-07-21 DIAGNOSIS — Z1211 Encounter for screening for malignant neoplasm of colon: Secondary | ICD-10-CM

## 2014-07-21 DIAGNOSIS — Z862 Personal history of diseases of the blood and blood-forming organs and certain disorders involving the immune mechanism: Secondary | ICD-10-CM

## 2014-07-21 DIAGNOSIS — I10 Essential (primary) hypertension: Secondary | ICD-10-CM

## 2014-07-21 LAB — POCT UA - MICROSCOPIC ONLY
CASTS, UR, LPF, POC: NEGATIVE
Crystals, Ur, HPF, POC: NEGATIVE
Epithelial cells, urine per micros: NEGATIVE
Mucus, UA: NEGATIVE
WBC, Ur, HPF, POC: NEGATIVE
YEAST UA: NEGATIVE

## 2014-07-21 LAB — LIPID PANEL
Cholesterol: 192 mg/dL (ref 0–200)
HDL: 75 mg/dL (ref >39)
LDL Cholesterol: 103 mg/dL — ABNORMAL HIGH (ref 0–99)
TRIGLYCERIDES: 72 mg/dL (ref <150)
Total CHOL/HDL Ratio: 2.6 Ratio
VLDL: 14 mg/dL (ref 0–40)

## 2014-07-21 LAB — COMPLETE METABOLIC PANEL WITH GFR
ALT: 12 U/L (ref 0–35)
AST: 17 U/L (ref 0–37)
Albumin: 4.3 g/dL (ref 3.5–5.2)
Alkaline Phosphatase: 53 U/L (ref 39–117)
BILIRUBIN TOTAL: 0.6 mg/dL (ref 0.2–1.2)
BUN: 8 mg/dL (ref 6–23)
CHLORIDE: 102 meq/L (ref 96–112)
CO2: 27 mEq/L (ref 19–32)
CREATININE: 0.83 mg/dL (ref 0.50–1.10)
Calcium: 9.2 mg/dL (ref 8.4–10.5)
GFR, Est African American: 82 mL/min
GFR, Est Non African American: 71 mL/min
Glucose, Bld: 80 mg/dL (ref 70–99)
Potassium: 4 mEq/L (ref 3.5–5.3)
SODIUM: 139 meq/L (ref 135–145)
Total Protein: 6.9 g/dL (ref 6.0–8.3)

## 2014-07-21 LAB — POCT URINALYSIS DIPSTICK
BILIRUBIN UA: NEGATIVE
GLUCOSE UA: NEGATIVE
Ketones, UA: NEGATIVE
LEUKOCYTES UA: NEGATIVE
NITRITE UA: NEGATIVE
Protein, UA: NEGATIVE
Spec Grav, UA: <=1.005
UROBILINOGEN UA: 0.2
pH, UA: 7

## 2014-07-21 MED ORDER — METOPROLOL SUCCINATE ER 25 MG PO TB24: ORAL_TABLET | ORAL | Status: DC

## 2014-07-21 MED ORDER — LISINOPRIL 10 MG PO TABS: 10.0000 mg | ORAL_TABLET | Freq: Every day | ORAL | Status: DC

## 2014-07-21 MED ORDER — OMEPRAZOLE MAGNESIUM 20 MG PO TBEC: 20.0000 mg | DELAYED_RELEASE_TABLET | Freq: Every day | ORAL | Status: DC

## 2014-07-21 NOTE — Patient Instructions (Signed)

## 2014-07-23 NOTE — Progress Notes (Signed)
Subjective:    Patient ID: Donna Simmons, female    DOB: Feb 11, 1941, 73 y.o.   MRN: [id number]  HPI  This 73 y.o. AA female is [facility]. 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 She is compliant w/ medications w/o adverse effects.   HCM: MMG- Current.           CRS- Current (2012 w/ 10-year recall).           IMM- Current; consider [id number].           DEXA- Not documented.  Patient Active Problem List   Diagnosis Date Noted  . Chest pressure 07/27/2013  . Long term (current) use of anticoagulants 11/18/2012  . Hypercholesteremia 09/25/2012  . DDD (degenerative disc disease), cervical 05/19/2012  . Paroxysmal atrial fibrillation 04/20/2012  . HTN (hypertension) 04/20/2012  . Palpitations 01/10/2009    Prior to Admission medications   Medication Sig Start Date End Date Taking? Authorizing Provider  atorvastatin (LIPITOR) 10 MG tablet Take 1 tablet (10 mg total) by mouth daily with lunch. 05/22/14  Yes Donna Lexavid W Harding, MD  flecainide (TAMBOCOR) 50 MG tablet Take 1 tablet (50 mg total) by mouth 2 (two) times daily. 06/06/14  Yes Donna Lexavid W Harding, MD  lisinopril (PRINIVIL,ZESTRIL) 10 MG tablet Take 1 tablet (10 mg total) by mouth daily.   Yes Donna MarchBarbara B Tyleigh Mahn, MD  lisinopril (PRINIVIL,ZESTRIL) 5 MG tablet Take 1 tablet (5 mg total) by mouth daily. 01/18/14  Yes Donna Lexavid W Harding, MD  metoprolol succinate (TOPROL-XL) 25 MG 24 hr tablet Take 1 tablet (25 mg) daily before dinner, may take an additional 1/2 tablet (12.5 mg) 6 hours after as needed.   Yes Donna MarchBarbara B Zuri Lascala, MD  Multiple Vitamin (MULTIVITAMIN WITH MINERALS) TABS tablet Take 1 tablet by mouth daily with lunch. Centrum Silver   Yes Historical Provider, MD  omeprazole (PRILOSEC) 20 MG capsule Take 20 mg by mouth daily.    Yes Historical Provider, MD  warfarin (COUMADIN) 5 MG tablet Take 2.5-5 mg by mouth daily with lunch. Take 1/2 tablet (2.5 mg) on Monday and Friday,  take 1 tablet (5 mg) on all other days.   Yes Historical Provider, MD  omeprazole (PRILOSEC OTC) 20 MG tablet Take 1 tablet (20 mg total) by mouth daily.    Donna MarchBarbara B Tabitha Tupper, MD    History   Social History  . Marital Status: Married    Spouse Name: N/A    Number of Children: N/A  . Years of Education: N/A   Occupational History  . Not on file.   Social History Main Topics  . Smoking status: Never Smoker   . Smokeless tobacco: Not on file  . Alcohol Use: No  . Drug Use: No  . Sexual Activity: Yes   Other Topics Concern  . Not on file   Social History Narrative   Married with no children. Walks several times a week at least 3-4 times a week about 2 miles a time. Does not smoke and does not drink.    Family History  Problem Relation Age of Onset  . Arthritis Mother     OSTEO  . Hypertension Mother   . Dementia Mother   . Heart disease Father   . Hypertension Sister   . Dementia Sister   . Cancer - Lung Brother   . Cancer Maternal Grandmother   . Heart disease Maternal Grandfather   . Cancer - Lung  Brother   . Hypertension Brother   . Cancer - Prostate Brother     Review of Systems  Constitutional: Negative.   HENT: Negative.   Eyes: Negative.        Has annual examination by eye care professional; has an immature cataract.  Respiratory: Negative.   Cardiovascular: Negative.   Gastrointestinal: Negative.   Genitourinary: Negative.   Musculoskeletal: Negative.   Skin: Negative.   Allergic/Immunologic: Negative.   Neurological: Negative.   Hematological: Negative.   Psychiatric/Behavioral: Negative.       Objective:   Physical Exam  Constitutional: She is oriented to person, place, and time. Vital signs are normal. She appears well-developed and well-nourished. No distress.  HENT:  Head: Normocephalic and atraumatic.  Right Ear: Hearing, tympanic membrane, external ear and ear canal normal.  Left Ear: Hearing, tympanic membrane, external ear and ear  canal normal.  Nose: Nose normal. No nasal deformity or septal deviation.  Mouth/Throat: Uvula is midline, oropharynx is clear and moist and mucous membranes are normal. No oral lesions. Normal dentition.  Eyes: Conjunctivae, EOM and lids are normal. Pupils are equal, round, and reactive to light. No scleral icterus.  Fundoscopic exam:      The right eye shows no papilledema. The right eye shows red reflex.       The left eye shows no papilledema. The left eye shows red reflex.  Neck: Trachea normal, normal range of motion, full passive range of motion without pain and phonation normal. Neck supple. No JVD present. No spinous process tenderness and no muscular tenderness present. Carotid bruit is not present. No thyroid mass and no thyromegaly present.  Cardiovascular: Normal rate, regular rhythm, S1 normal, S2 normal, normal heart sounds, intact distal pulses and normal pulses.   No extrasystoles are present. PMI is not displaced.  Exam reveals no gallop, no distant heart sounds and no friction rub.   No murmur heard. Pulmonary/Chest: Effort normal and breath sounds normal. No respiratory distress. Right breast exhibits no inverted nipple, no mass, no nipple discharge, no skin change and no tenderness. Left breast exhibits no inverted nipple, no mass, no nipple discharge, no skin change and no tenderness. Breasts are symmetrical.  Abdominal: Soft. Normal appearance, normal aorta and bowel sounds are normal. She exhibits no distension, no abdominal bruit, no pulsatile midline mass and no mass. There is no hepatosplenomegaly. There is no tenderness. There is no guarding and no CVA tenderness.  Genitourinary:  Deferred.  Musculoskeletal:       Cervical back: Normal.       Thoracic back: Normal.       Lumbar back: Normal.  Good ROM in major joints w/ mild degenerative changes.  Lymphadenopathy:       Head (right side): No submental, no submandibular, no tonsillar, no preauricular, no posterior  auricular and no occipital adenopathy present.       Head (left side): No submental, no submandibular, no tonsillar, no preauricular, no posterior auricular and no occipital adenopathy present.    She has no cervical adenopathy.    She has no axillary adenopathy.       Right: No inguinal and no supraclavicular adenopathy present.       Left: No inguinal and no supraclavicular adenopathy present.  Neurological: She is alert and oriented to person, place, and time. She has normal strength and normal reflexes. She displays no atrophy and no tremor. No cranial nerve deficit or sensory deficit. She exhibits normal muscle tone. She displays a  negative Romberg sign. Coordination and gait normal.  Get Up and Go test: score = 10 sec.  Skin: Skin is warm, dry and intact. No ecchymosis, no lesion and no rash noted. She is not diaphoretic. No cyanosis or erythema. Nails show no clubbing.  Psychiatric: She has a normal mood and affect. Her speech is normal and behavior is normal. Judgment and thought content normal. Cognition and memory are normal.  Nursing note and vitals reviewed.      Assessment & Plan:  Routine general medical examination at a health care facility  Essential hypertension - Stable on current medications; continue same. Plan: COMPLETE METABOLIC PANEL WITH GFR, POCT UA - Microscopic Only, POCT urinalysis dipstick  History of anemia -  April 2015 CBC: H/H 12.5/38.7 w/ MCV= 76.8. Plan: IFOBT POC (occult bld, rslt in office), COMPLETE METABOLIC PANEL WITH GFR  Pure hypercholesterolemia - Continue atorvastatin 10 mg 1 tablet daily.  Plan: Lipid panel  Screening for colon cancer - Plan: IFOBT POC (occult bld, rslt in office)  Meds ordered this encounter  Medications  . lisinopril (PRINIVIL,ZESTRIL) 10 MG tablet    Sig: Take 1 tablet (10 mg total) by mouth daily.    Dispense:  90 tablet    Refill:  1  . metoprolol succinate (TOPROL-XL) 25 MG 24 hr tablet    Sig: Take 1 tablet (25  mg) daily before dinner, may take an additional 1/2 tablet (12.5 mg) 6 hours after as needed.    Dispense:  90 tablet    Refill:  1  . omeprazole (PRILOSEC OTC) 20 MG tablet    Sig: Take 1 tablet (20 mg total) by mouth daily.    Dispense:  30 tablet    Refill:  5

## 2014-08-09 ENCOUNTER — Other Ambulatory Visit: Payer: Self-pay | Admitting: Pharmacist Clinician (PhC)/ Clinical Pharmacy Specialist

## 2014-08-11 ENCOUNTER — Telehealth: Payer: Self-pay | Admitting: Pharmacist Clinician (PhC)/ Clinical Pharmacy Specialist

## 2014-08-11 NOTE — Telephone Encounter (Signed)
Please call,need to change manufacturer.

## 2014-08-14 MED ORDER — WARFARIN SODIUM 5 MG PO TABS: ORAL_TABLET | ORAL | Status: DC

## 2014-08-28 ENCOUNTER — Ambulatory Visit (INDEPENDENT_AMBULATORY_CARE_PROVIDER_SITE_OTHER): Payer: Medicare PPO | Admitting: Pharmacist Clinician (PhC)/ Clinical Pharmacy Specialist

## 2014-08-28 DIAGNOSIS — Z7901 Long term (current) use of anticoagulants: Secondary | ICD-10-CM

## 2014-08-28 DIAGNOSIS — I480 Paroxysmal atrial fibrillation: Secondary | ICD-10-CM

## 2014-08-28 LAB — POCT INR: INR: 1.5

## 2014-09-12 ENCOUNTER — Other Ambulatory Visit: Payer: Self-pay | Admitting: Pharmacist Clinician (PhC)/ Clinical Pharmacy Specialist

## 2014-09-12 MED ORDER — WARFARIN SODIUM 5 MG PO TABS: ORAL_TABLET | ORAL | Status: DC

## 2014-09-18 ENCOUNTER — Ambulatory Visit: Payer: Medicare PPO | Admitting: Pharmacist Clinician (PhC)/ Clinical Pharmacy Specialist

## 2014-09-18 ENCOUNTER — Other Ambulatory Visit: Payer: Self-pay | Admitting: Internal Medicine

## 2014-09-22 ENCOUNTER — Ambulatory Visit: Payer: Medicare PPO | Admitting: Pharmacist Clinician (PhC)/ Clinical Pharmacy Specialist

## 2014-09-29 ENCOUNTER — Ambulatory Visit (INDEPENDENT_AMBULATORY_CARE_PROVIDER_SITE_OTHER): Payer: Medicare PPO | Admitting: Pharmacist Clinician (PhC)/ Clinical Pharmacy Specialist

## 2014-09-29 DIAGNOSIS — I480 Paroxysmal atrial fibrillation: Secondary | ICD-10-CM

## 2014-09-29 DIAGNOSIS — Z7901 Long term (current) use of anticoagulants: Secondary | ICD-10-CM

## 2014-09-29 LAB — POCT INR: INR: 1.9

## 2014-09-29 MED ORDER — LISINOPRIL 10 MG PO TABS: 10.0000 mg | ORAL_TABLET | Freq: Every day | ORAL | Status: DC

## 2014-10-20 ENCOUNTER — Ambulatory Visit (INDEPENDENT_AMBULATORY_CARE_PROVIDER_SITE_OTHER): Payer: Medicare PPO | Admitting: Pharmacist Clinician (PhC)/ Clinical Pharmacy Specialist

## 2014-10-20 DIAGNOSIS — Z7901 Long term (current) use of anticoagulants: Secondary | ICD-10-CM

## 2014-10-20 DIAGNOSIS — I480 Paroxysmal atrial fibrillation: Secondary | ICD-10-CM

## 2014-10-20 LAB — POCT INR: INR: 2.5

## 2014-11-17 ENCOUNTER — Ambulatory Visit (INDEPENDENT_AMBULATORY_CARE_PROVIDER_SITE_OTHER): Payer: Medicare PPO | Admitting: Pharmacist Clinician (PhC)/ Clinical Pharmacy Specialist

## 2014-11-17 DIAGNOSIS — I480 Paroxysmal atrial fibrillation: Secondary | ICD-10-CM

## 2014-11-17 DIAGNOSIS — Z7901 Long term (current) use of anticoagulants: Secondary | ICD-10-CM

## 2014-11-17 LAB — POCT INR: INR: 1.9

## 2014-12-07 ENCOUNTER — Other Ambulatory Visit: Payer: Self-pay | Admitting: Cardiology

## 2014-12-15 ENCOUNTER — Other Ambulatory Visit: Payer: Self-pay | Admitting: Pharmacist Clinician (PhC)/ Clinical Pharmacy Specialist

## 2014-12-15 ENCOUNTER — Ambulatory Visit (INDEPENDENT_AMBULATORY_CARE_PROVIDER_SITE_OTHER): Payer: Medicare PPO | Admitting: Pharmacist Clinician (PhC)/ Clinical Pharmacy Specialist

## 2014-12-15 DIAGNOSIS — I480 Paroxysmal atrial fibrillation: Secondary | ICD-10-CM | POA: Diagnosis not present

## 2014-12-15 DIAGNOSIS — Z7901 Long term (current) use of anticoagulants: Secondary | ICD-10-CM

## 2014-12-15 LAB — POCT INR: INR: 1.6

## 2014-12-15 MED ORDER — WARFARIN SODIUM 5 MG PO TABS: ORAL_TABLET | ORAL | Status: DC

## 2015-01-05 ENCOUNTER — Ambulatory Visit (INDEPENDENT_AMBULATORY_CARE_PROVIDER_SITE_OTHER): Payer: Medicare PPO | Admitting: Pharmacist Clinician (PhC)/ Clinical Pharmacy Specialist

## 2015-01-05 DIAGNOSIS — Z7901 Long term (current) use of anticoagulants: Secondary | ICD-10-CM

## 2015-01-05 DIAGNOSIS — I480 Paroxysmal atrial fibrillation: Secondary | ICD-10-CM

## 2015-01-05 LAB — POCT INR: INR: 3.1

## 2015-01-05 MED ORDER — WARFARIN SODIUM 5 MG PO TABS: ORAL_TABLET | ORAL | Status: DC

## 2015-01-10 ENCOUNTER — Other Ambulatory Visit: Payer: Self-pay | Admitting: Family Medicine

## 2015-01-10 DIAGNOSIS — Z1231 Encounter for screening mammogram for malignant neoplasm of breast: Secondary | ICD-10-CM

## 2015-01-12 ENCOUNTER — Ambulatory Visit: Payer: Medicare PPO | Admitting: Pharmacist Clinician (PhC)/ Clinical Pharmacy Specialist

## 2015-01-30 ENCOUNTER — Ambulatory Visit (HOSPITAL_COMMUNITY): Payer: Medicare PPO

## 2015-02-02 ENCOUNTER — Ambulatory Visit: Payer: Medicare PPO | Admitting: Pharmacist Clinician (PhC)/ Clinical Pharmacy Specialist

## 2015-02-07 ENCOUNTER — Ambulatory Visit (HOSPITAL_COMMUNITY)
Admission: RE | Admit: 2015-02-07 | Discharge: 2015-02-07 | Disposition: A | Discharge status: Home / Self Care | Payer: Medicare PPO | Source: Ambulatory Visit | Attending: Family Medicine | Admitting: Family Medicine

## 2015-02-07 DIAGNOSIS — Z1231 Encounter for screening mammogram for malignant neoplasm of breast: Secondary | ICD-10-CM | POA: Diagnosis present

## 2015-02-08 ENCOUNTER — Ambulatory Visit (INDEPENDENT_AMBULATORY_CARE_PROVIDER_SITE_OTHER): Payer: Medicare PPO | Admitting: Pharmacist Clinician (PhC)/ Clinical Pharmacy Specialist

## 2015-02-08 DIAGNOSIS — Z7901 Long term (current) use of anticoagulants: Secondary | ICD-10-CM | POA: Diagnosis not present

## 2015-02-08 DIAGNOSIS — I480 Paroxysmal atrial fibrillation: Secondary | ICD-10-CM

## 2015-02-08 LAB — POCT INR: INR: 1.4

## 2015-02-26 ENCOUNTER — Other Ambulatory Visit: Payer: Self-pay

## 2015-02-27 ENCOUNTER — Ambulatory Visit (INDEPENDENT_AMBULATORY_CARE_PROVIDER_SITE_OTHER): Payer: Medicare PPO | Admitting: Pharmacist Clinician (PhC)/ Clinical Pharmacy Specialist

## 2015-02-27 ENCOUNTER — Ambulatory Visit (INDEPENDENT_AMBULATORY_CARE_PROVIDER_SITE_OTHER): Payer: Medicare PPO | Admitting: Cardiology

## 2015-02-27 ENCOUNTER — Encounter: Payer: Self-pay | Admitting: Cardiology

## 2015-02-27 VITALS — BP 140/84 | HR 70 | Ht 64.0 in | Wt 136.0 lb

## 2015-02-27 DIAGNOSIS — I480 Paroxysmal atrial fibrillation: Secondary | ICD-10-CM | POA: Diagnosis not present

## 2015-02-27 DIAGNOSIS — E7800 Pure hypercholesterolemia, unspecified: Secondary | ICD-10-CM

## 2015-02-27 DIAGNOSIS — R002 Palpitations: Secondary | ICD-10-CM

## 2015-02-27 DIAGNOSIS — I10 Essential (primary) hypertension: Secondary | ICD-10-CM | POA: Diagnosis not present

## 2015-02-27 DIAGNOSIS — Z7901 Long term (current) use of anticoagulants: Secondary | ICD-10-CM

## 2015-02-27 LAB — POCT INR: INR: 3.7

## 2015-02-27 MED ORDER — METOPROLOL SUCCINATE ER 25 MG PO TB24: ORAL_TABLET | ORAL | Status: DC

## 2015-02-27 MED ORDER — LISINOPRIL 10 MG PO TABS: 10.0000 mg | ORAL_TABLET | Freq: Every day | ORAL | Status: DC

## 2015-02-27 MED ORDER — ATORVASTATIN CALCIUM 10 MG PO TABS: 10.0000 mg | ORAL_TABLET | Freq: Every day | ORAL | Status: DC

## 2015-02-27 MED ORDER — WARFARIN SODIUM 5 MG PO TABS: ORAL_TABLET | ORAL | Status: DC

## 2015-02-27 MED ORDER — FLECAINIDE ACETATE 50 MG PO TABS: 50.0000 mg | ORAL_TABLET | Freq: Two times a day (BID) | ORAL | Status: DC

## 2015-02-27 NOTE — Patient Instructions (Signed)
NO CHANGE WITH CURRENT MEDICATIONS.  Your physician wants you to follow-up in 12 MONTHS DR HARDING.  You will receive a reminder letter in the mail two months in advance. If you don't receive a letter, please call our office to schedule the follow-up appointment.  

## 2015-02-27 NOTE — Progress Notes (Signed)
PCP: Ellsworth Lennox, MD  Clinic Note: Chief Complaint  Patient presents with  . Annual Exam    1 year follow; no chest pain, no shortness of breath, no edema, no pain in legs, no cramping in legs, lightheadedness-occassional, dizziness-occassional  . Medication Refill    ALL CARDIADIAC MEDICATIONS, CHOL LEVEL DONE 07/2014  . Atrial Fibrillation    HPI: Donna Simmons is a 74 y.o. female with a PMH below who presents today for ~1 yr f/u of HTN, PAF & frequent PACx/PVCs.  I first met her in April 2012 when she presented with symptoms concerning for unstable angina/mild troponin elevation in the setting of atrial fibrillation. She had normal coronary arteries, so the elevated troponin was thought to be related to mild heart failure symptoms and A. fib RVR.  Last seen May 2015.  No new studies since then  Past Medical History  Diagnosis Date  . Paroxysmal atrial fibrillation 12/27/2008  . Hypertension   . Hypercholesteremia   . GERD (gastroesophageal reflux disease)   . Palpitations 01/10/2009    14 day monitor- some sinus tachycardia, PVCs  . Allergy   . Heart murmur     No significant valvular lesion noted on echo.    Prior Cardiac Evaluation and Past Surgical History: Past Surgical History  Procedure Laterality Date  . Breast surgery    . Fracture surgery    . Abdominal hysterectomy  1980  . Cardiac catheterization  12/16/2010    no evidence of CAD to explain anginal pain w/ positive troponin.  potential etiology is breakthrough AF  . Transthoracic echocardiogram  April 2010    Echo - EF >55; RV mils/mod dilated, RV systolic function mildly reduced; RA mild/moderately dilated; moderate pulmonary hypertension; mild tricuspid regurgitation  . Nm myoview ltd  April 2010    EF 64%, normal pattern of perfusion in all regions, no scintigraphic evidence of inducible ischemia; no significant wall motion abnormalities; EKG negative for ischemia; no significant change from last  study; low risk scan    Interval History: Doing well without any complaints.  No notable palpitation Sx.  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  She cogently notes feeling somewhat woozy sometimes early in AM - may be somewhat associated with chronic sinusitis. Has not needed PRN Metoprolol.  No chest pain or shortness of breath with rest or exertion. No PND, orthopnea or edema.  Rare palpitations, No lightheadedness, dizziness, weakness or syncope/near syncope. No TIA/amaurosis fugax symptoms. No melena, hematochezia, hematuria, or epstaxis. No claudication.  ROS: A comprehensive was performed. Review of Systems  HENT: Positive for congestion (chronic sinusitis).   Respiratory: Negative for cough.        Chronic nasal drainage - leads to cough  Cardiovascular: Negative for claudication.  Gastrointestinal: Positive for heartburn.  Neurological: Positive for dizziness (some positional) and headaches (sinus HA / allergy related).  Endo/Heme/Allergies: Does not bruise/bleed easily.  All other systems reviewed and are negative.   Current Outpatient Prescriptions on File Prior to Visit  Medication Sig Dispense Refill  . Multiple Vitamin (MULTIVITAMIN WITH MINERALS) TABS tablet Take 1 tablet by mouth daily with lunch. Centrum Silver    . omeprazole (PRILOSEC) 20 MG capsule Take 20 mg by mouth daily.      No current facility-administered medications on file prior to visit.   Allergies  Allergen Reactions  . Antihistamines, Chlorpheniramine-Type Palpitations    Makes heart beat fast   History  Substance Use Topics  . Smoking  status: Never Smoker   . Smokeless tobacco: Not on file  . Alcohol Use: No   Family History  Problem Relation Age of Onset  . Arthritis Mother     OSTEO  . Hypertension Mother   . Dementia Mother   . Heart disease Father   . Hypertension Sister   . Dementia Sister   . Cancer - Lung Brother   . Cancer Maternal Grandmother   .  Heart disease Maternal Grandfather   . Cancer - Lung Brother   . Hypertension Brother   . Cancer - Prostate Brother     Wt Readings from Last 3 Encounters:  02/27/15 61.689 kg (136 lb)  07/21/14 63.957 kg (141 lb)  02/22/14 65.318 kg (144 lb)    PHYSICAL EXAM BP 140/84 mmHg  Pulse 70  Ht _0  (1.626 m)  Wt 61.689 kg (136 lb)  BMI 23.33 kg/m2 Gen.: She is oriented to person, place, and time. She appears well-developed and well-nourished. normal mood and affect Pleasant, conversant, appears younger than stated age  48: [place]/AT, EOMI, MMM, anicteric sclera; Eyes: Conjunctivae and EOM are normal. Pupils are equal, round, and reactive to light.  Neck: Normal range of motion. Neck supple. No JVD or bruit Cardiovascular: RRR, normal S1-S2. No M./R./G. Nondisplaced PMI. Pulses 2+ equal bilaterally. Pulmonary/Chest: Nonlabored, good air movement. CT AB, no W./R./R. Abdominal: Soft/NT/ND/NABS/ No HSM. Neurological: She is alert and oriented to person, place, and time. No cranial nerve defic   Adult ECG Report  Rate: 70 ;  Rhythm: normal sinus rhythm and Normal axis, intervals and durations.  Narrative Interpretation: Normal EKG  Recent Labs:  No new labs available   Other studies Reviewed: - No new studies  ASSESSMENT / PLAN: Problem List Items Addressed This Visit    Essential hypertension (Chronic)    Borderline pressures today. Is on 10 mg of lisinopril plus metoprolol. Continue to monitor and increase as needed.      Relevant Medications   atorvastatin (LIPITOR) 10 MG tablet   flecainide (TAMBOCOR) 50 MG tablet   lisinopril (PRINIVIL,ZESTRIL) 10 MG tablet   metoprolol succinate (TOPROL-XL) 25 MG 24 hr tablet   warfarin (COUMADIN) 5 MG tablet   Other Relevant Orders   EKG 12-Lead (Completed)   Hypercholesteremia - Primary (Chronic)    Abdomen well controlled and monitored by PCP.  On low-dose statin.      Relevant Medications   atorvastatin (LIPITOR) 10 MG tablet     flecainide (TAMBOCOR) 50 MG tablet   lisinopril (PRINIVIL,ZESTRIL) 10 MG tablet   metoprolol succinate (TOPROL-XL) 25 MG 24 hr tablet   warfarin (COUMADIN) 5 MG tablet   Other Relevant Orders   EKG 12-Lead (Completed)   Long term current use of anticoagulant therapy (Chronic)    By choice, she is on warfarin. Would continue to discuss with her she would want to switch over to NOAC.      Palpitations (Chronic)   Paroxysmal atrial fibrillation (Chronic)    No recurrent symptoms of A. fib. Her presenting symptom was truthfully almost like angina. On flecainide for rhythm control along with beta blocker as the AV nodal agent. Anticoagulated on warfarin.      Relevant Medications   atorvastatin (LIPITOR) 10 MG tablet   flecainide (TAMBOCOR) 50 MG tablet   lisinopril (PRINIVIL,ZESTRIL) 10 MG tablet   metoprolol succinate (TOPROL-XL) 25 MG 24 hr tablet   warfarin (COUMADIN) 5 MG tablet   Other Relevant Orders   EKG 12-Lead (Completed)  Current medicines are reviewed at length with the patient today. (+/- concerns) n/a The following changes have been made: n/a labs/ tests ordered today include:   Orders Placed This Encounter  Procedures  . EKG 12-Lead    refills:  Meds ordered this encounter  Medications  . atorvastatin (LIPITOR) 10 MG tablet    Sig: Take 1 tablet (10 mg total) by mouth daily with lunch.    Dispense:  90 tablet    Refill:  3  . flecainide (TAMBOCOR) 50 MG tablet    Sig: Take 1 tablet (50 mg total) by mouth 2 (two) times daily.    Dispense:  180 tablet    Refill:  3  . lisinopril (PRINIVIL,ZESTRIL) 10 MG tablet    Sig: Take 1 tablet (10 mg total) by mouth daily.    Dispense:  90 tablet    Refill:  3  . metoprolol succinate (TOPROL-XL) 25 MG 24 hr tablet    Sig: Take 1 tablet (25 mg) daily before dinner, may take an additional 1/2 tablet (12.5 mg) 6 hours after as needed.    Dispense:  90 tablet    Refill:  1  . warfarin (COUMADIN) 5 MG tablet     Sig: Take 1 tablet by mouth daily or as directed by coumadin clinic    Dispense:  90 tablet    Refill:  1     Followup: One year   Leonie Man, M.D., M.S. Interventional Cardiologist   Pager # [phone]

## 2015-02-28 ENCOUNTER — Encounter: Payer: Self-pay | Admitting: Cardiology

## 2015-02-28 NOTE — Assessment & Plan Note (Signed)
By choice, she is on warfarin. Would continue to discuss with her she would want to switch over to NOAC.

## 2015-02-28 NOTE — Assessment & Plan Note (Signed)
No recurrent symptoms of A. fib. Her presenting symptom was truthfully almost like angina. On flecainide for rhythm control along with beta blocker as the AV nodal agent. Anticoagulated on warfarin.

## 2015-02-28 NOTE — Assessment & Plan Note (Signed)
Borderline pressures today. Is on 10 mg of lisinopril plus metoprolol. Continue to monitor and increase as needed.

## 2015-02-28 NOTE — Assessment & Plan Note (Signed)
Abdomen well controlled and monitored by PCP.  On low-dose statin.

## 2015-03-12 ENCOUNTER — Ambulatory Visit (INDEPENDENT_AMBULATORY_CARE_PROVIDER_SITE_OTHER): Payer: Medicare PPO | Admitting: Pharmacist Clinician (PhC)/ Clinical Pharmacy Specialist

## 2015-03-12 DIAGNOSIS — I480 Paroxysmal atrial fibrillation: Secondary | ICD-10-CM

## 2015-03-12 DIAGNOSIS — Z7901 Long term (current) use of anticoagulants: Secondary | ICD-10-CM | POA: Diagnosis not present

## 2015-03-12 LAB — POCT INR: INR: 4.4

## 2015-03-26 ENCOUNTER — Ambulatory Visit (INDEPENDENT_AMBULATORY_CARE_PROVIDER_SITE_OTHER): Payer: Medicare PPO | Admitting: Pharmacist Clinician (PhC)/ Clinical Pharmacy Specialist

## 2015-03-26 DIAGNOSIS — I480 Paroxysmal atrial fibrillation: Secondary | ICD-10-CM | POA: Diagnosis not present

## 2015-03-26 DIAGNOSIS — Z7901 Long term (current) use of anticoagulants: Secondary | ICD-10-CM

## 2015-03-26 LAB — POCT INR: INR: 1.6

## 2015-04-09 ENCOUNTER — Ambulatory Visit: Payer: Medicare PPO | Admitting: Pharmacist Clinician (PhC)/ Clinical Pharmacy Specialist

## 2015-04-11 ENCOUNTER — Ambulatory Visit (INDEPENDENT_AMBULATORY_CARE_PROVIDER_SITE_OTHER): Payer: Medicare PPO | Admitting: Pharmacist Clinician (PhC)/ Clinical Pharmacy Specialist

## 2015-04-11 DIAGNOSIS — I480 Paroxysmal atrial fibrillation: Secondary | ICD-10-CM

## 2015-04-11 DIAGNOSIS — Z7901 Long term (current) use of anticoagulants: Secondary | ICD-10-CM | POA: Diagnosis not present

## 2015-04-11 LAB — POCT INR: INR: 4

## 2015-04-13 ENCOUNTER — Encounter (HOSPITAL_COMMUNITY): Payer: Self-pay | Admitting: Emergency Medicine

## 2015-04-13 ENCOUNTER — Emergency Department (HOSPITAL_COMMUNITY)
Admission: EM | Admit: 2015-04-13 | Discharge: 2015-04-13 | Disposition: A | Discharge status: Home / Self Care | Payer: Medicare PPO | Source: Home / Self Care | Attending: Family Medicine | Admitting: Family Medicine

## 2015-04-13 DIAGNOSIS — J3489 Other specified disorders of nose and nasal sinuses: Secondary | ICD-10-CM | POA: Diagnosis not present

## 2015-04-13 DIAGNOSIS — J0101 Acute recurrent maxillary sinusitis: Secondary | ICD-10-CM

## 2015-04-13 MED ORDER — AMOXICILLIN 500 MG PO CAPS: 500.0000 mg | ORAL_CAPSULE | Freq: Two times a day (BID) | ORAL | Status: DC

## 2015-04-13 NOTE — ED Notes (Signed)
Co sinus States she has right ear pain Does have facial soreness, sneezing, watery eyes and stuffy nose Used nasal sprays as tx

## 2015-04-13 NOTE — Discharge Instructions (Signed)
Sinusitis °Sinusitis is redness, soreness, and puffiness (inflammation) of the air pockets in the bones of your face (sinuses). The redness, soreness, and puffiness can cause air and mucus to get trapped in your sinuses. This can allow germs to grow and cause an infection.  °HOME CARE  °· Drink enough fluids to keep your pee (urine) clear or pale yellow. °· Use a humidifier in your home. °· Run a hot shower to create steam in the bathroom. Sit in the bathroom with the door closed. Breathe in the steam 3-4 times a day. °· Put a warm, moist washcloth on your face 3-4 times a day, or as told by your doctor. °· Use salt water sprays (saline sprays) to wet the thick fluid in your nose. This can help the sinuses drain. °· Only take medicine as told by your doctor. °GET HELP RIGHT AWAY IF:  °· Your pain gets worse. °· You have very bad headaches. °· You are sick to your stomach (nauseous). °· You throw up (vomit). °· You are very sleepy (drowsy) all the time. °· Your face is puffy (swollen). °· Your vision changes. °· You have a stiff neck. °· You have trouble breathing. °MAKE SURE YOU:  °· Understand these instructions. °· Will watch your condition. °· Will get help right away if you are not doing well or get worse. °Document Released: 02/04/2008 Document Revised: 05/12/2012 Document Reviewed: 03/23/2012 °Exit[employer]® Patient Information ©2015 [employer]. This information is not intended to replace advice given to you by your health care provider. Make sure you discuss any questions you have with your health care provider. ° °

## 2015-04-13 NOTE — ED Provider Notes (Signed)
CSN: [id number]     Arrival date & time 04/13/15  1605 History   First MD Initiated Contact with Patient 04/13/15 1736     Chief Complaint  Patient presents with  . Sinusitis   (Consider location/radiation/quality/duration/timing/severity/associated sxs/prior Treatment) HPI Comments: Patient presents with a 4 day history of sinus congestion, rhinorrhea, tenderness along eyes and nose. Headaches are also noted. No cough, fevers are noted. She reports these are "chronic" and amoxicillin works for her well. She is requesting this today.   Patient is a 74 y.o. female presenting with sinusitis. The history is provided by the patient.  Sinusitis Associated symptoms: congestion, fatigue and rhinorrhea   Associated symptoms: no chills and no fever     Past Medical History  Diagnosis Date  . Paroxysmal atrial fibrillation 12/27/2008  . Hypertension   . Hypercholesteremia   . GERD (gastroesophageal reflux disease)   . Palpitations 01/10/2009    14 day monitor- some sinus tachycardia, PVCs  . Allergy   . Heart murmur     No significant valvular lesion noted on echo.   Past Surgical History  Procedure Laterality Date  . Breast surgery    . Fracture surgery    . Abdominal hysterectomy  1980  . Cardiac catheterization  12/16/2010    no evidence of CAD to explain anginal pain w/ positive troponin.  potential etiology is breakthrough AF  . Transthoracic echocardiogram  April 2010    Echo - EF >55; RV mils/mod dilated, RV systolic function mildly reduced; RA mild/moderately dilated; moderate pulmonary hypertension; mild tricuspid regurgitation  . Nm myoview ltd  April 2010    EF 64%, normal pattern of perfusion in all regions, no scintigraphic evidence of inducible ischemia; no significant wall motion abnormalities; EKG negative for ischemia; no significant change from last study; low risk scan   Family History  Problem Relation Age of Onset  . Arthritis Mother     OSTEO  . Hypertension  Mother   . Dementia Mother   . Heart disease Father   . Hypertension Sister   . Dementia Sister   . Cancer - Lung Brother   . Cancer Maternal Grandmother   . Heart disease Maternal Grandfather   . Cancer - Lung Brother   . Hypertension Brother   . Cancer - Prostate Brother    Social History  Substance Use Topics  . Smoking status: Never Smoker   . Smokeless tobacco: None  . Alcohol Use: No   OB History    No data available     Review of Systems  Constitutional: Positive for fatigue. Negative for fever and chills.  HENT: Positive for congestion, rhinorrhea and sinus pressure.   Eyes: Positive for discharge.  Respiratory: Negative.   Allergic/Immunologic: Positive for environmental allergies.  Psychiatric/Behavioral: Negative.     Allergies  Antihistamines, chlorpheniramine-type  Home Medications   Prior to Admission medications   Medication Sig Start Date End Date Taking? Authorizing Provider  amoxicillin (AMOXIL) 500 MG capsule Take 1 capsule (500 mg total) by mouth 2 (two) times daily. 04/13/15   Riki Sheer, PA-C  atorvastatin (LIPITOR) 10 MG tablet Take 1 tablet (10 mg total) by mouth daily with lunch. 02/27/15   Marykay Lex, MD  flecainide (TAMBOCOR) 50 MG tablet Take 1 tablet (50 mg total) by mouth 2 (two) times daily. 02/27/15   Marykay Lex, MD  lisinopril (PRINIVIL,ZESTRIL) 10 MG tablet Take 1 tablet (10 mg total) by mouth daily. 02/27/15   Piedad Climes  Herbie Baltimore, MD  metoprolol succinate (TOPROL-XL) 25 MG 24 hr tablet Take 1 tablet (25 mg) daily before dinner, may take an additional 1/2 tablet (12.5 mg) 6 hours after as needed. 02/27/15   Marykay Lex, MD  Multiple Vitamin (MULTIVITAMIN WITH MINERALS) TABS tablet Take 1 tablet by mouth daily with lunch. Centrum Silver    Historical Provider, MD  omeprazole (PRILOSEC) 20 MG capsule Take 20 mg by mouth daily.     Historical Provider, MD  warfarin (COUMADIN) 5 MG tablet Take 1 tablet by mouth daily or as directed  by coumadin clinic 02/27/15   Marykay Lex, MD   BP 179/84 mmHg  Pulse 68  Temp(Src) 98.1 F (36.7 C) (Oral)  Resp 18  SpO2 99% Physical Exam  Constitutional: She is oriented to person, place, and time. She appears well-developed and well-nourished. No distress.  HENT:  Head: Normocephalic and atraumatic.  Right Ear: External ear normal.  Left Ear: External ear normal.  Mouth/Throat: Oropharynx is clear and moist.  Posterior turbinates with erythema and swelling. Tenderness along the maxillary sinus to palpation.  Cardiovascular: Normal rate and regular rhythm.   Pulmonary/Chest: Effort normal and breath sounds normal. No respiratory distress.  Neurological: She is alert and oriented to person, place, and time.  Skin: Skin is warm and dry. She is not diaphoretic.  Psychiatric: Her behavior is normal.  Nursing note and vitals reviewed.   ED Course  Procedures (including critical care time) Labs Review Labs Reviewed - No data to display  Imaging Review No results found.   MDM   1. Acute recurrent maxillary sinusitis   2. Nasal drainage    Cover with Amox as requested; this appears to have worked well for her in the past. She will also resume her Flonase. F/U if needed.     Riki Sheer, PA-C 04/13/15 1758

## 2015-04-25 ENCOUNTER — Ambulatory Visit: Payer: Medicare PPO | Admitting: Pharmacist Clinician (PhC)/ Clinical Pharmacy Specialist

## 2015-04-26 ENCOUNTER — Ambulatory Visit (INDEPENDENT_AMBULATORY_CARE_PROVIDER_SITE_OTHER): Payer: Medicare PPO | Admitting: Pharmacist Clinician (PhC)/ Clinical Pharmacy Specialist

## 2015-04-26 DIAGNOSIS — I480 Paroxysmal atrial fibrillation: Secondary | ICD-10-CM

## 2015-04-26 DIAGNOSIS — Z7901 Long term (current) use of anticoagulants: Secondary | ICD-10-CM | POA: Diagnosis not present

## 2015-04-26 LAB — POCT INR: INR: 1.5

## 2015-05-08 IMAGING — CR DG CHEST 1V PORT
1 series · 1 of 1 positions shown · non-contrast
Comparison: 12/17/2010

CLINICAL DATA: Chest pain and irregular heartbeat

EXAM:
PORTABLE CHEST - 1 VIEW

[AP]
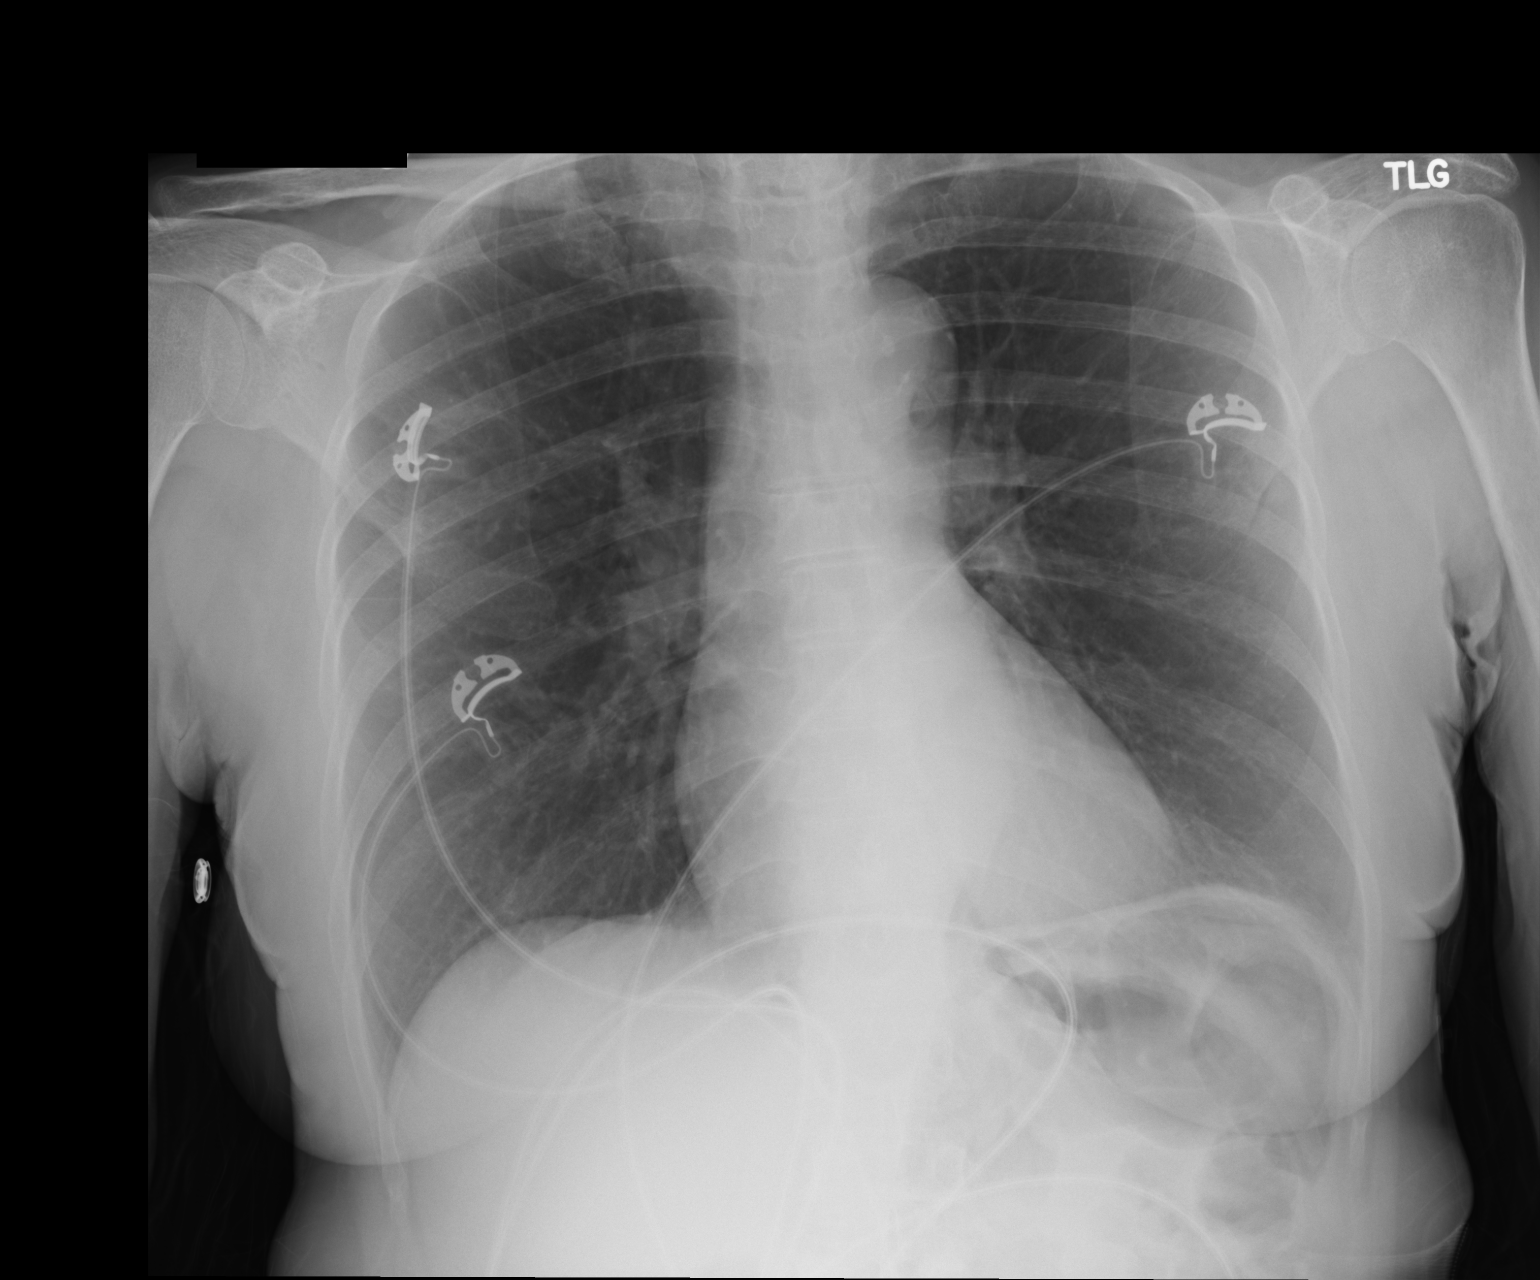

[1 of 1 positions shown; findings below may reference images not displayed]

FINDINGS: Mild hyperinflation. No infiltrate or edema. No effusion or
pneumothorax. Normal heart size. No acute osseous findings.
IMPRESSION: No active disease.

## 2015-05-18 ENCOUNTER — Ambulatory Visit (INDEPENDENT_AMBULATORY_CARE_PROVIDER_SITE_OTHER): Payer: Medicare PPO | Admitting: Pharmacist Clinician (PhC)/ Clinical Pharmacy Specialist

## 2015-05-18 DIAGNOSIS — I480 Paroxysmal atrial fibrillation: Secondary | ICD-10-CM

## 2015-05-18 DIAGNOSIS — Z7901 Long term (current) use of anticoagulants: Secondary | ICD-10-CM | POA: Diagnosis not present

## 2015-05-18 LAB — POCT INR: INR: 4.1

## 2015-06-01 ENCOUNTER — Ambulatory Visit (INDEPENDENT_AMBULATORY_CARE_PROVIDER_SITE_OTHER): Payer: Medicare PPO | Admitting: Pharmacist Clinician (PhC)/ Clinical Pharmacy Specialist

## 2015-06-01 DIAGNOSIS — Z7901 Long term (current) use of anticoagulants: Secondary | ICD-10-CM

## 2015-06-01 DIAGNOSIS — I480 Paroxysmal atrial fibrillation: Secondary | ICD-10-CM | POA: Diagnosis not present

## 2015-06-01 LAB — POCT INR: INR: 3.3

## 2015-06-04 ENCOUNTER — Ambulatory Visit (INDEPENDENT_AMBULATORY_CARE_PROVIDER_SITE_OTHER): Payer: Medicare PPO

## 2015-06-04 DIAGNOSIS — Z23 Encounter for immunization: Secondary | ICD-10-CM

## 2015-06-18 ENCOUNTER — Ambulatory Visit (INDEPENDENT_AMBULATORY_CARE_PROVIDER_SITE_OTHER): Payer: Medicare PPO | Admitting: Pharmacist Clinician (PhC)/ Clinical Pharmacy Specialist

## 2015-06-18 DIAGNOSIS — I480 Paroxysmal atrial fibrillation: Secondary | ICD-10-CM

## 2015-06-18 DIAGNOSIS — Z7901 Long term (current) use of anticoagulants: Secondary | ICD-10-CM

## 2015-06-18 LAB — POCT INR: INR: 2.8

## 2015-07-09 ENCOUNTER — Ambulatory Visit (INDEPENDENT_AMBULATORY_CARE_PROVIDER_SITE_OTHER): Payer: Medicare PPO | Admitting: Pharmacist Clinician (PhC)/ Clinical Pharmacy Specialist

## 2015-07-09 DIAGNOSIS — I480 Paroxysmal atrial fibrillation: Secondary | ICD-10-CM

## 2015-07-09 DIAGNOSIS — Z7901 Long term (current) use of anticoagulants: Secondary | ICD-10-CM | POA: Diagnosis not present

## 2015-07-09 LAB — POCT INR: INR: 2.7

## 2015-08-06 ENCOUNTER — Ambulatory Visit: Payer: Medicare PPO | Admitting: Pharmacist Clinician (PhC)/ Clinical Pharmacy Specialist

## 2015-08-15 ENCOUNTER — Ambulatory Visit: Payer: Medicare PPO | Admitting: Pharmacist Clinician (PhC)/ Clinical Pharmacy Specialist

## 2015-08-22 ENCOUNTER — Ambulatory Visit (INDEPENDENT_AMBULATORY_CARE_PROVIDER_SITE_OTHER): Payer: Medicare PPO | Admitting: Pharmacist Clinician (PhC)/ Clinical Pharmacy Specialist

## 2015-08-22 DIAGNOSIS — I480 Paroxysmal atrial fibrillation: Secondary | ICD-10-CM

## 2015-08-22 DIAGNOSIS — Z7901 Long term (current) use of anticoagulants: Secondary | ICD-10-CM

## 2015-08-22 LAB — POCT INR: INR: 4.5

## 2015-08-29 ENCOUNTER — Encounter: Payer: Self-pay | Admitting: Family Medicine

## 2015-08-29 ENCOUNTER — Other Ambulatory Visit: Payer: Self-pay | Admitting: Family Medicine

## 2015-08-29 ENCOUNTER — Ambulatory Visit (INDEPENDENT_AMBULATORY_CARE_PROVIDER_SITE_OTHER): Payer: Medicare PPO | Admitting: Family Medicine

## 2015-08-29 VITALS — BP 160/90 | HR 74 | Temp 98.4°F | Resp 16 | Ht 63.5 in | Wt 138.4 lb

## 2015-08-29 DIAGNOSIS — E785 Hyperlipidemia, unspecified: Secondary | ICD-10-CM

## 2015-08-29 DIAGNOSIS — Z23 Encounter for immunization: Secondary | ICD-10-CM

## 2015-08-29 DIAGNOSIS — N393 Stress incontinence (female) (male): Secondary | ICD-10-CM

## 2015-08-29 DIAGNOSIS — D72819 Decreased white blood cell count, unspecified: Secondary | ICD-10-CM | POA: Diagnosis not present

## 2015-08-29 DIAGNOSIS — R208 Other disturbances of skin sensation: Secondary | ICD-10-CM

## 2015-08-29 DIAGNOSIS — I10 Essential (primary) hypertension: Secondary | ICD-10-CM | POA: Diagnosis not present

## 2015-08-29 DIAGNOSIS — Z0000 Encounter for general adult medical examination without abnormal findings: Secondary | ICD-10-CM | POA: Diagnosis not present

## 2015-08-29 DIAGNOSIS — R209 Unspecified disturbances of skin sensation: Secondary | ICD-10-CM | POA: Diagnosis not present

## 2015-08-29 DIAGNOSIS — Z7901 Long term (current) use of anticoagulants: Secondary | ICD-10-CM | POA: Diagnosis not present

## 2015-08-29 DIAGNOSIS — IMO0002 Reserved for concepts with insufficient information to code with codable children: Secondary | ICD-10-CM

## 2015-08-29 LAB — LIPID PANEL
Cholesterol: 230 mg/dL — ABNORMAL HIGH (ref 125–200)
HDL: 85 mg/dL (ref >=46)
LDL CALC: 125 mg/dL (ref <130)
Total CHOL/HDL Ratio: 2.7 Ratio (ref <=5.0)
Triglycerides: 100 mg/dL (ref <150)
VLDL: 20 mg/dL (ref <30)

## 2015-08-29 LAB — COMPREHENSIVE METABOLIC PANEL
ALT: 8 U/L (ref 6–29)
AST: 15 U/L (ref 10–35)
Albumin: 4 g/dL (ref 3.6–5.1)
Alkaline Phosphatase: 53 U/L (ref 33–130)
BILIRUBIN TOTAL: 0.6 mg/dL (ref 0.2–1.2)
BUN: 6 mg/dL — AB (ref 7–25)
CHLORIDE: 105 mmol/L (ref 98–110)
CO2: 27 mmol/L (ref 20–31)
CREATININE: 0.88 mg/dL (ref 0.60–0.93)
Calcium: 8.9 mg/dL (ref 8.6–10.4)
Glucose, Bld: 85 mg/dL (ref 65–99)
Potassium: 4.2 mmol/L (ref 3.5–5.3)
SODIUM: 141 mmol/L (ref 135–146)
TOTAL PROTEIN: 6.5 g/dL (ref 6.1–8.1)

## 2015-08-29 LAB — VITAMIN B12: Vitamin B-12: 303 pg/mL (ref 211–911)

## 2015-08-29 LAB — FOLATE: FOLATE: 10.2 ng/mL

## 2015-08-29 LAB — CBC
HCT: 39.8 % (ref 36.0–46.0)
HEMOGLOBIN: 12.3 g/dL (ref 12.0–15.0)
MCH: 25.1 pg — AB (ref 26.0–34.0)
MCHC: 30.9 g/dL (ref 30.0–36.0)
MCV: 81.1 fL (ref 78.0–100.0)
MPV: 10.6 fL (ref 8.6–12.4)
PLATELETS: 251 10*3/uL (ref 150–400)
RBC: 4.91 MIL/uL (ref 3.87–5.11)
RDW: 15.2 % (ref 11.5–15.5)
WBC: 2.8 10*3/uL — AB (ref 4.0–10.5)

## 2015-08-29 NOTE — Progress Notes (Signed)
[facility] [address], [place] [address] [phone]  Date:  08/29/2015   Name:  Donna Simmons   DOB:  12-05-1940   MRN:  [id number]  PCP:  Dow Adolph, MD    Chief Complaint: Annual Exam   History of Present Illness:  Donna Simmons is a 74 y.o. very pleasant female patient who presents with the following:  History of A fib on flecainide and coumadin.  Also metoprolol, lisinopril and lipitor.  Her cardiologist is Dr. Herbie Baltimore and she is managed by the [facility] coumadin clinic.  Last visit there a week ago- next visit 09/05/15  She has not had a prevnar but shots are OW UTD.  She has not noted any CP and is overall feeling well. She does suffer from some GERD- uses prilosec on a prn basis.  She will take it for 2 weeks every 4 months per Dr. Elnoria Howard.  When her GERD is more prolematic she notes that her BP will also go up.   When she is feeling well her BP tends to be 130- 140/80s.  She knows it is higher right now as her GERD is exacerbated  She likes to jog for exercise- she goes about 2 miles twice a week.  She used to run the 10 k but has not done this in a few years since she fractured her ankle  She has noted a little tingling in the tips of her fingers and toes, bilateally for about one week.    Cholesterol and CMP checked about this time a year ago.  She is fasting today for labs   Per her metrics she is not always complaint with her statin- however her chl has looked great.  She may not need to take it every day to control her lipids. Will check today  Lab Results  Component Value Date   INR 4.5 08/22/2015   INR 2.7 07/09/2015   INR 2.8 06/18/2015     Patient Active Problem List   Diagnosis Date Noted  . Long term current use of anticoagulant therapy 11/18/2012  . Hypercholesteremia 09/25/2012  . DDD (degenerative disc disease), cervical 05/19/2012  . Paroxysmal atrial fibrillation (HCC) 04/20/2012  . Essential hypertension  04/20/2012  . Palpitations 01/10/2009    Past Medical History  Diagnosis Date  . Paroxysmal atrial fibrillation (HCC) 12/27/2008  . Hypertension   . Hypercholesteremia   . GERD (gastroesophageal reflux disease)   . Palpitations 01/10/2009    14 day monitor- some sinus tachycardia, PVCs  . Allergy   . Heart murmur     No significant valvular lesion noted on echo.    Past Surgical History  Procedure Laterality Date  . Breast surgery    . Fracture surgery    . Abdominal hysterectomy  1980  . Cardiac catheterization  12/16/2010    no evidence of CAD to explain anginal pain w/ positive troponin.  potential etiology is breakthrough AF  . Transthoracic echocardiogram  April 2010    Echo - EF >55; RV mils/mod dilated, RV systolic function mildly reduced; RA mild/moderately dilated; moderate pulmonary hypertension; mild tricuspid regurgitation  . Nm myoview ltd  April 2010    EF 64%, normal pattern of perfusion in all regions, no scintigraphic evidence of inducible ischemia; no significant wall motion abnormalities; EKG negative for ischemia; no significant change from last study; low risk scan    Social History  Substance Use Topics  . Smoking status: Never Smoker   .  Smokeless tobacco: None  . Alcohol Use: No    Family History  Problem Relation Age of Onset  . Arthritis Mother     OSTEO  . Hypertension Mother   . Dementia Mother   . Heart disease Father   . Hypertension Sister   . Dementia Sister   . Cancer - Lung Brother   . Cancer Maternal Grandmother   . Heart disease Maternal Grandfather   . Cancer - Lung Brother   . Hypertension Brother   . Cancer - Prostate Brother     Allergies  Allergen Reactions  . Antihistamines, Chlorpheniramine-Type Palpitations    Makes heart beat fast    Medication list has been reviewed and updated.  Current Outpatient Prescriptions on File Prior to Visit  Medication Sig Dispense Refill  . atorvastatin (LIPITOR) 10 MG tablet Take 1  tablet (10 mg total) by mouth daily with lunch. 90 tablet 3  . flecainide (TAMBOCOR) 50 MG tablet Take 1 tablet (50 mg total) by mouth 2 (two) times daily. 180 tablet 3  . lisinopril (PRINIVIL,ZESTRIL) 10 MG tablet Take 1 tablet (10 mg total) by mouth daily. 90 tablet 3  . metoprolol succinate (TOPROL-XL) 25 MG 24 hr tablet Take 1 tablet (25 mg) daily before dinner, may take an additional 1/2 tablet (12.5 mg) 6 hours after as needed. 90 tablet 1  . Multiple Vitamin (MULTIVITAMIN WITH MINERALS) TABS tablet Take 1 tablet by mouth daily with lunch. Centrum Silver    . omeprazole (PRILOSEC) 20 MG capsule Take 20 mg by mouth daily.     Marland Kitchen. warfarin (COUMADIN) 5 MG tablet Take 1 tablet by mouth daily or as directed by coumadin clinic 90 tablet 1   No current facility-administered medications on file prior to visit.    Review of Systems:  As per HPI- otherwise negative.   Physical Examination: Filed Vitals:   08/29/15 0842  BP: 162/85  Pulse: 74  Temp: 98.4 F (36.9 C)  Resp: 16   Filed Vitals:   08/29/15 0842  Height: 5' 3.5" (1.613 m)  Weight: 138 lb 6.4 oz (62.778 kg)   Body mass index is 24.13 kg/(m^2). Ideal Body Weight: Weight in (lb) to have BMI = 25: 143.1  GEN: WDWN, NAD, Non-toxic, A & O x 3, looks well, very fit for age HEENT: Atraumatic, Normocephalic. Neck supple. No masses, No LAD.  Bilateral TM wnl, oropharynx normal.  PEERL,EOMI.   Ears and Nose: No external deformity. CV: RRR, No M/G/R. No JVD. No thrill. No extra heart sounds. PULM: CTA B, no wheezes, crackles, rhonchi. No retractions. No resp. distress. No accessory muscle use. ABD: S, NT, ND. No rebound. No HSM. Breast: normal exam, no masses/ dimpling/ discharge EXTR: No c/c/e NEURO Normal gait.  PSYCH: Normally interactive. Conversant. Not depressed or anxious appearing.  Calm demeanor.    Assessment and Plan: Physical exam  Immunization due - Plan: Pneumococcal conjugate vaccine 13-valent IM  Stress  incontinence  Occasional numbness/prickling/tingling of fingers and toes - Plan: Comprehensive metabolic panel, Vitamin B12, Folate  Hyperlipidemia - Plan: Lipid panel  Chronic anticoagulation - Plan: CBC  Benign essential HTN   Here today for a PE.  She is doing very well overall.  She does have some stress incont and has to wear a "diaper" most of the time.  However she is not interested in a pessary or surgery at this time as it is not that bothersome Discussed her options and she will let me know if she  does desire anything further in this regard Her BP is a bit high today which she relates to current GERD exacerbation. She will watch this and let me know if not back to baseline  Will plan further follow- up pending labs.   Signed Abbe Amsterdam, MD

## 2015-08-29 NOTE — Patient Instructions (Signed)
It was great to see you today- I will be in touch with your labs asap Continue to exercise- you are taking great care of yourself!  Please keep an eye on your BP- if your readings continue to run over 150/90 please contact myself or your cardiologist because we may need to adjust your medication a bit I will check your potassium, vitamin b12 and folate to see if we can find any cause of the tingling in your fingers and toes.  If this persists please let me know   You got your pneumonia booster (prevnar 13) shot today

## 2015-08-30 ENCOUNTER — Encounter: Payer: Self-pay | Admitting: Family Medicine

## 2015-08-30 NOTE — Addendum Note (Signed)
Addended by: Abbe AmsterdamOPLAND, Angelena Sand C on: 08/30/2015 05:39 PM   Modules accepted: Orders

## 2015-09-05 ENCOUNTER — Ambulatory Visit (INDEPENDENT_AMBULATORY_CARE_PROVIDER_SITE_OTHER): Payer: Medicare Other | Admitting: Pharmacist Clinician (PhC)/ Clinical Pharmacy Specialist

## 2015-09-05 DIAGNOSIS — I480 Paroxysmal atrial fibrillation: Secondary | ICD-10-CM | POA: Diagnosis not present

## 2015-09-05 DIAGNOSIS — Z7901 Long term (current) use of anticoagulants: Secondary | ICD-10-CM

## 2015-09-05 LAB — POCT INR: INR: 2.4

## 2015-09-22 ENCOUNTER — Ambulatory Visit (INDEPENDENT_AMBULATORY_CARE_PROVIDER_SITE_OTHER): Payer: Medicare Other | Admitting: Physician Assistant

## 2015-09-22 VITALS — BP 140/90 | HR 78 | Temp 97.8°F | Resp 18 | Ht 64.25 in | Wt 137.0 lb

## 2015-09-22 DIAGNOSIS — R0789 Other chest pain: Secondary | ICD-10-CM | POA: Diagnosis not present

## 2015-09-22 DIAGNOSIS — K219 Gastro-esophageal reflux disease without esophagitis: Secondary | ICD-10-CM | POA: Diagnosis not present

## 2015-09-22 MED ORDER — OMEPRAZOLE 20 MG PO CPDR: 20.0000 mg | DELAYED_RELEASE_CAPSULE | Freq: Every day | ORAL | Status: DC

## 2015-09-22 NOTE — Patient Instructions (Signed)
Your symptoms sound like your GERD is worsening.  We are checking you for an H pylori infection today which can cause a worsening of your GERD. I'll be in touch with you in a few days when this result is back.  In the meantime, please take your prilosec once daily until you see your GI doctor in early February. They will look into this further when you see them.  If you have any of the chest burning with walking or exertion be sure to go to the ER right away.   Gastroesophageal Reflux Disease, Adult Normally, food travels down the esophagus and stays in the stomach to be digested. However, when a person has gastroesophageal reflux disease (GERD), food and stomach acid move back up into the esophagus. When this happens, the esophagus becomes sore and inflamed. Over time, GERD can create small holes (ulcers) in the lining of the esophagus.  CAUSES This condition is caused by a problem with the muscle between the esophagus and the stomach (lower esophageal sphincter, or LES). Normally, the LES muscle closes after food passes through the esophagus to the stomach. When the LES is weakened or abnormal, it does not close properly, and that allows food and stomach acid to go back up into the esophagus. The LES can be weakened by certain dietary substances, medicines, and medical conditions, including:  Tobacco use.  Pregnancy.  Having a hiatal hernia.  Heavy alcohol use.  Certain foods and beverages, such as coffee, chocolate, onions, and peppermint. RISK FACTORS This condition is more likely to develop in:  People who have an increased body weight.  People who have connective tissue disorders.  People who use NSAID medicines. SYMPTOMS Symptoms of this condition include:  Heartburn.  Difficult or painful swallowing.  The feeling of having a lump in the throat.  Abitter taste in the mouth.  Bad breath.  Having a large amount of saliva.  Having an upset or bloated  stomach.  Belching.  Chest pain.  Shortness of breath or wheezing.  Ongoing (chronic) cough or a night-time cough.  Wearing away of tooth enamel.  Weight loss. Different conditions can cause chest pain. Make sure to see your health care provider if you experience chest pain. DIAGNOSIS Your health care provider will take a medical history and perform a physical exam. To determine if you have mild or severe GERD, your health care provider may also monitor how you respond to treatment. You may also have other tests, including:  An endoscopy toexamine your stomach and esophagus with a small camera.  A test thatmeasures the acidity level in your esophagus.  A test thatmeasures how much pressure is on your esophagus.  A barium swallow or modified barium swallow to show the shape, size, and functioning of your esophagus. TREATMENT The goal of treatment is to help relieve your symptoms and to prevent complications. Treatment for this condition may vary depending on how severe your symptoms are. Your health care provider may recommend:  Changes to your diet.  Medicine.  Surgery. HOME CARE INSTRUCTIONS Diet  Follow a diet as recommended by your health care provider. This may involve avoiding foods and drinks such as:  Coffee and tea (with or without caffeine).  Drinks that containalcohol.  Energy drinks and sports drinks.  Carbonated drinks or sodas.  Chocolate and cocoa.  Peppermint and mint flavorings.  Garlic and onions.  Horseradish.  Spicy and acidic foods, including peppers, chili powder, curry powder, vinegar, hot sauces, and barbecue sauce.  Citrus fruit juices and citrus fruits, such as oranges, lemons, and limes.  Tomato-based foods, such as red sauce, chili, salsa, and pizza with red sauce.  Fried and fatty foods, such as donuts, french fries, potato chips, and high-fat dressings.  High-fat meats, such as hot dogs and fatty cuts of red and white  meats, such as rib eye steak, sausage, ham, and bacon.  High-fat dairy items, such as whole milk, butter, and cream cheese.  Eat small, frequent meals instead of large meals.  Avoid drinking large amounts of liquid with your meals.  Avoid eating meals during the 2-3 hours before bedtime.  Avoid lying down right after you eat.  Do not exercise right after you eat. General Instructions  Pay attention to any changes in your symptoms.  Take over-the-counter and prescription medicines only as told by your health care provider. Do not take aspirin, ibuprofen, or other NSAIDs unless your health care provider told you to do so.  Do not use any tobacco products, including cigarettes, chewing tobacco, and e-cigarettes. If you need help quitting, ask your health care provider.  Wear loose-fitting clothing. Do not wear anything tight around your waist that causes pressure on your abdomen.  Raise (elevate) the head of your bed 6 inches (15cm).  Try to reduce your stress, such as with yoga or meditation. If you need help reducing stress, ask your health care provider.  If you are overweight, reduce your weight to an amount that is healthy for you. Ask your health care provider for guidance about a safe weight loss goal.  Keep all follow-up visits as told by your health care provider. This is important. SEEK MEDICAL CARE IF:  You have new symptoms.  You have unexplained weight loss.  You have difficulty swallowing, or it hurts to swallow.  You have wheezing or a persistent cough.  Your symptoms do not improve with treatment.  You have a hoarse voice. SEEK IMMEDIATE MEDICAL CARE IF:  You have pain in your arms, neck, jaw, teeth, or back.  You feel sweaty, dizzy, or light-headed.  You have chest pain or shortness of breath.  You vomit and your vomit looks like blood or coffee grounds.  You faint.  Your stool is bloody or black.  You cannot swallow, drink, or eat.   This  information is not intended to replace advice given to you by your health care provider. Make sure you discuss any questions you have with your health care provider.   Document Released: 05/28/2005 Document Revised: 05/09/2015 Document Reviewed: 12/13/2014 Elsevier Interactive Patient Education [employer].

## 2015-09-22 NOTE — Progress Notes (Signed)
   Subjective:    Patient ID: Donna Simmons, female    DOB: 02/10/1941, 75 y.o.   MRN: [id number]  Chief Complaint  Patient presents with  . Gastroesophageal Reflux    x 3 days-tried taking Mylanta & it caused consitpation   Medications, allergies, past medical history, surgical history, family history, social history and problem list reviewed and updated.  HPI  53 yof with hx gerd presents with worsening gerd like symptoms.   Per pt she has seen GI before. She had an EGD several years back which was normal per pt. She has a prescription for prilosec, and takes the medicine for 14 days every 4 months per her GI physician. She last completed this 14 day course in early December.   For past 3 days has had epigastric burning radiating from stomach to upper chest after meals and when lying flat. Resolves after 20 minutes. Taking mylanta but not PPI/zantac. She does not take NSAIDs at home at all as she is on warfarin. Denies chest burning with exertion. Denies sob. Walks 2 miles 2x week, completed this yesterday without chest discomfort. The epigastric burning does not radiate into jaw or down arms. No associated diaphoresis. Denies odynophagia or dysphagia.   Denies hx osteoporosis.   Review of Systems No fevers, chills. See HPI.      Objective:   Physical Exam  Constitutional: She appears well-developed and well-nourished.  Non-toxic appearance. She does not have a sickly appearance. She does not appear ill. No distress.  BP 140/90 mmHg  Pulse 78  Temp(Src) 97.8 F (36.6 C) (Oral)  Resp 18  Ht 5' 4.25" (1.632 m)  Wt 137 lb (62.143 kg)  BMI 23.33 kg/m2  SpO2 99%   Cardiovascular: Normal rate, regular rhythm and normal heart sounds.   Psychiatric: She has a normal mood and affect. Her speech is normal and behavior is normal.      Assessment & Plan:   Gastroesophageal reflux disease, esophagitis presence not specified - Plan: H. pylori breath test, omeprazole (PRILOSEC) 20 MG  capsule  Burning in the chest --doubt cardiac etiology as pt has no exertional symptoms, had normal LHC in 2012 --suspect GERD causing symptoms, start prilosec qd, checking hpylori today --pt is scheduled to see her GI in 3 weeks, take prilosec until this appt --rtc sooner with dysphagia, odynophagia  Donna Lopes, PA-C Physician Assistant-Certified [facility]  09/22/2015 3:53 PM

## 2015-09-24 LAB — H. PYLORI BREATH TEST: H. PYLORI BREATH TEST: NOT DETECTED

## 2015-09-25 ENCOUNTER — Encounter (HOSPITAL_COMMUNITY): Payer: Self-pay

## 2015-09-25 ENCOUNTER — Emergency Department (HOSPITAL_COMMUNITY)
Admission: EM | Admit: 2015-09-25 | Discharge: 2015-09-25 | Disposition: A | Discharge status: Home / Self Care | Payer: Medicare Other | Attending: Emergency Medicine | Admitting: Emergency Medicine

## 2015-09-25 ENCOUNTER — Emergency Department (HOSPITAL_COMMUNITY): Payer: Medicare Other

## 2015-09-25 DIAGNOSIS — I10 Essential (primary) hypertension: Secondary | ICD-10-CM | POA: Insufficient documentation

## 2015-09-25 DIAGNOSIS — Z9889 Other specified postprocedural states: Secondary | ICD-10-CM | POA: Insufficient documentation

## 2015-09-25 DIAGNOSIS — R011 Cardiac murmur, unspecified: Secondary | ICD-10-CM | POA: Insufficient documentation

## 2015-09-25 DIAGNOSIS — K219 Gastro-esophageal reflux disease without esophagitis: Secondary | ICD-10-CM | POA: Diagnosis not present

## 2015-09-25 DIAGNOSIS — Z7901 Long term (current) use of anticoagulants: Secondary | ICD-10-CM | POA: Diagnosis not present

## 2015-09-25 DIAGNOSIS — Z79899 Other long term (current) drug therapy: Secondary | ICD-10-CM | POA: Diagnosis not present

## 2015-09-25 DIAGNOSIS — R002 Palpitations: Secondary | ICD-10-CM | POA: Diagnosis present

## 2015-09-25 DIAGNOSIS — E7800 Pure hypercholesterolemia, unspecified: Secondary | ICD-10-CM | POA: Insufficient documentation

## 2015-09-25 DIAGNOSIS — I480 Paroxysmal atrial fibrillation: Secondary | ICD-10-CM | POA: Diagnosis not present

## 2015-09-25 LAB — URINE MICROSCOPIC-ADD ON: WBC UA: NONE SEEN WBC/hpf (ref 0–5)

## 2015-09-25 LAB — BASIC METABOLIC PANEL
Anion gap: 11 (ref 5–15)
CHLORIDE: 104 mmol/L (ref 101–111)
CO2: 25 mmol/L (ref 22–32)
CREATININE: 0.92 mg/dL (ref 0.44–1.00)
Calcium: 8.8 mg/dL — ABNORMAL LOW (ref 8.9–10.3)
GFR calc Af Amer: >60 mL/min (ref >60)
GFR calc non Af Amer: >60 mL/min (ref >60)
GLUCOSE: 101 mg/dL — AB (ref 65–99)
Potassium: 3.3 mmol/L — ABNORMAL LOW (ref 3.5–5.1)
SODIUM: 140 mmol/L (ref 135–145)

## 2015-09-25 LAB — CBC WITH DIFFERENTIAL/PLATELET
Basophils Absolute: 0 10*3/uL (ref 0.0–0.1)
Basophils Relative: 0 %
EOS ABS: 0 10*3/uL (ref 0.0–0.7)
Eosinophils Relative: 0 %
HCT: 36.3 % (ref 36.0–46.0)
HEMOGLOBIN: 11.4 g/dL — AB (ref 12.0–15.0)
LYMPHS ABS: 1.1 10*3/uL (ref 0.7–4.0)
LYMPHS PCT: 28 %
MCH: 24.5 pg — AB (ref 26.0–34.0)
MCHC: 31.4 g/dL (ref 30.0–36.0)
MCV: 78.1 fL (ref 78.0–100.0)
MONOS PCT: 9 %
Monocytes Absolute: 0.3 10*3/uL (ref 0.1–1.0)
NEUTROS PCT: 63 %
Neutro Abs: 2.4 10*3/uL (ref 1.7–7.7)
Platelets: 214 10*3/uL (ref 150–400)
RBC: 4.65 MIL/uL (ref 3.87–5.11)
RDW: 14.6 % (ref 11.5–15.5)
WBC: 3.8 10*3/uL — ABNORMAL LOW (ref 4.0–10.5)

## 2015-09-25 LAB — MAGNESIUM: MAGNESIUM: 2.1 mg/dL (ref 1.7–2.4)

## 2015-09-25 LAB — URINALYSIS, ROUTINE W REFLEX MICROSCOPIC
BILIRUBIN URINE: NEGATIVE
Glucose, UA: NEGATIVE mg/dL
KETONES UR: NEGATIVE mg/dL
LEUKOCYTES UA: NEGATIVE
NITRITE: NEGATIVE
PROTEIN: NEGATIVE mg/dL
Specific Gravity, Urine: 1.003 — ABNORMAL LOW (ref 1.005–1.030)
pH: 5.5 (ref 5.0–8.0)

## 2015-09-25 LAB — PROTIME-INR
INR: 1.55 — AB (ref 0.00–1.49)
Prothrombin Time: 18.6 seconds — ABNORMAL HIGH (ref 11.6–15.2)

## 2015-09-25 LAB — BRAIN NATRIURETIC PEPTIDE: B NATRIURETIC PEPTIDE 5: 32.9 pg/mL (ref 0.0–100.0)

## 2015-09-25 LAB — I-STAT TROPONIN, ED: Troponin i, poc: 0 ng/mL (ref 0.00–0.08)

## 2015-09-25 MED ORDER — POTASSIUM CHLORIDE CRYS ER 20 MEQ PO TBCR
40.0000 meq | EXTENDED_RELEASE_TABLET | Freq: Once | ORAL | Status: AC
  Administered 2015-09-25: 40 meq via ORAL
  Filled 2015-09-25: qty 2

## 2015-09-25 NOTE — ED Notes (Signed)
Per EMS: Pt from home, began experiencing heart palpitations this evening. States she was doing work around [employer]. Began experiencing some tremors in arms as well. Hx a-fib and htn. BP 200/120 upon EMS arrival, BP down to 170/98 upon arrival to ED. Pt denies any chest pain or SOB at this time.

## 2015-09-25 NOTE — ED Provider Notes (Signed)
CSN: [id number]     Arrival date & time 09/25/15  0246 History  By signing my name below, I, Donna Simmons, attest that this documentation has been prepared under the direction and in the presence of Tomasita Crumble, MD.   Electronically Signed: Iona Simmons, ED Scribe. 09/25/2015. 3:32 AM   Chief Complaint  Patient presents with  . Palpitations      The history is provided by the patient. No language interpreter was used.   HPI Comments: Donna Simmons is a 75 y.o. female with PMHx of A-fib who presents to the Emergency Department via ambulance complaining of sudden onset palpitations, onset around midnight when she was doing chores around the house. Pt states that the palpitations resolved when she was on her way to the hospital. She states that she is usually able to manage her palpitations with breathing techniques but was unable to tonight. Palpitations are typically stimulated by decongestants and antihistamines but pt denies taking any of those medications recently. Pt regularly takes coumadin, metoprolol, and flecainide and has not missed any doses. Pt has hx of bladder prolapse and states she has typical urinary frequency which has increased recently. Pt denies vomiting, diarrhea, chest pain, and shortness of breath, dysuria, and hematuria.    Past Medical History  Diagnosis Date  . Paroxysmal atrial fibrillation (HCC) 12/27/2008  . Hypertension   . Hypercholesteremia   . GERD (gastroesophageal reflux disease)   . Palpitations 01/10/2009    14 day monitor- some sinus tachycardia, PVCs  . Allergy   . Heart murmur     No significant valvular lesion noted on echo.   Past Surgical History  Procedure Laterality Date  . Breast surgery    . Fracture surgery    . Abdominal hysterectomy  1980  . Cardiac catheterization  12/16/2010    no evidence of CAD to explain anginal pain w/ positive troponin.  potential etiology is breakthrough AF  . Transthoracic echocardiogram  April 2010     Echo - EF >55; RV mils/mod dilated, RV systolic function mildly reduced; RA mild/moderately dilated; moderate pulmonary hypertension; mild tricuspid regurgitation  . Nm myoview ltd  April 2010    EF 64%, normal pattern of perfusion in all regions, no scintigraphic evidence of inducible ischemia; no significant wall motion abnormalities; EKG negative for ischemia; no significant change from last study; low risk scan   Family History  Problem Relation Age of Onset  . Arthritis Mother     OSTEO  . Hypertension Mother   . Dementia Mother   . Heart disease Father   . Hypertension Sister   . Dementia Sister   . Cancer - Lung Brother   . Cancer Maternal Grandmother   . Heart disease Maternal Grandfather   . Cancer - Lung Brother   . Hypertension Brother   . Cancer - Prostate Brother    Social History  Substance Use Topics  . Smoking status: Never Smoker   . Smokeless tobacco: None  . Alcohol Use: No   OB History    No data available     Review of Systems 10 Systems reviewed and all are negative for acute change except as noted in the HPI.   Allergies  Antihistamines, chlorpheniramine-type  Home Medications   Prior to Admission medications   Medication Sig Start Date End Date Taking? Authorizing Provider  atorvastatin (LIPITOR) 10 MG tablet Take 1 tablet (10 mg total) by mouth daily with lunch. 02/27/15   Marykay Lex, MD  flecainide (TAMBOCOR) 50 MG tablet Take 1 tablet (50 mg total) by mouth 2 (two) times daily. 02/27/15   Marykay Lex, MD  lisinopril (PRINIVIL,ZESTRIL) 10 MG tablet Take 1 tablet (10 mg total) by mouth daily. 02/27/15   Marykay Lex, MD  metoprolol succinate (TOPROL-XL) 25 MG 24 hr tablet Take 1 tablet (25 mg) daily before dinner, may take an additional 1/2 tablet (12.5 mg) 6 hours after as needed. 02/27/15   Marykay Lex, MD  Multiple Vitamin (MULTIVITAMIN WITH MINERALS) TABS tablet Take 1 tablet by mouth daily with lunch. Centrum Silver     Historical Provider, MD  omeprazole (PRILOSEC) 20 MG capsule Take 1 capsule (20 mg total) by mouth daily. Take BID for 14 days every 4 months 09/22/15   Raelyn Ensign, PA  warfarin (COUMADIN) 5 MG tablet Take 1 tablet by mouth daily or as directed by coumadin clinic 02/27/15   Marykay Lex, MD   There were no vitals taken for this visit. Physical Exam  Constitutional: She is oriented to person, place, and time. She appears well-developed and well-nourished. No distress.  HENT:  Head: Normocephalic and atraumatic.  Nose: Nose normal.  Mouth/Throat: Oropharynx is clear and moist. No oropharyngeal exudate.  Eyes: Conjunctivae and EOM are normal. Pupils are equal, round, and reactive to light. No scleral icterus.  Neck: Normal range of motion. Neck supple. No JVD present. No tracheal deviation present. No thyromegaly present.  Cardiovascular: Normal rate and normal heart sounds.  An irregularly irregular rhythm present. Exam reveals no gallop and no friction rub.   No murmur heard. Pulmonary/Chest: Effort normal and breath sounds normal. No respiratory distress. She has no wheezes. She exhibits no tenderness.  Abdominal: Soft. Bowel sounds are normal. She exhibits no distension and no mass. There is no tenderness. There is no rebound and no guarding.  Musculoskeletal: Normal range of motion. She exhibits no edema or tenderness.  Lymphadenopathy:    She has no cervical adenopathy.  Neurological: She is alert and oriented to person, place, and time. No cranial nerve deficit. She exhibits normal muscle tone.  Skin: Skin is warm and dry. No rash noted. No erythema. No pallor.  Nursing note and vitals reviewed.   ED Course  Procedures  DIAGNOSTIC STUDIES: Oxygen Saturation is 100% on RA, normal by my interpretation.    COORDINATION OF CARE:  3:02 AM-Discussed treatment plan which includes CXR, EKG, urinalysis, CBC with differential, BMP, and troponin with pt at bedside and pt agreed to plan.    Labs Review Labs Reviewed  CBC WITH DIFFERENTIAL/PLATELET - Abnormal; Notable for the following:    WBC 3.8 (*)    Hemoglobin 11.4 (*)    MCH 24.5 (*)    All other components within normal limits  BASIC METABOLIC PANEL - Abnormal; Notable for the following:    Potassium 3.3 (*)    Glucose, Bld 101 (*)    BUN <5 (*)    Calcium 8.8 (*)    All other components within normal limits  PROTIME-INR - Abnormal; Notable for the following:    Prothrombin Time 18.6 (*)    INR 1.55 (*)    All other components within normal limits  URINALYSIS, ROUTINE W REFLEX MICROSCOPIC (NOT AT [facility]) - Abnormal; Notable for the following:    Specific Gravity, Urine 1.003 (*)    Hgb urine dipstick MODERATE (*)    All other components within normal limits  URINE MICROSCOPIC-ADD ON - Abnormal; Notable for the following:  Squamous Epithelial / LPF 0-5 (*)    Bacteria, UA RARE (*)    All other components within normal limits  MAGNESIUM  BRAIN NATRIURETIC PEPTIDE  I-STAT TROPOININ, ED    Imaging Review Dg Chest 2 View  09/25/2015  CLINICAL DATA:  Rapid heartbeat tonight. Chest pain. History of atrial fibrillation. EXAM: CHEST  2 VIEW COMPARISON:  07/22/2013 FINDINGS: Pulmonary hyperinflation. Normal heart size and pulmonary vascularity. No focal airspace disease or consolidation in the lungs. No blunting of costophrenic angles. No pneumothorax. Mediastinal contours appear intact. Calcified and tortuous aorta. IMPRESSION: No active cardiopulmonary disease. Electronically Signed   By: Burman Nieves M.D.   On: 09/25/2015 03:25   I have personally reviewed and evaluated these images and lab results as part of my medical decision-making.   EKG Interpretation   Date/Time:  Tuesday September 25 2015 02:53:30 EST Ventricular Rate:  65 PR Interval:  180 QRS Duration: 95 QT Interval:  401 QTC Calculation: 417 R Axis:   27 Text Interpretation:  Sinus rhythm No significant change since last  tracing Confirmed  by Erroll Luna [phone]) on 09/25/2015 3:43:58 AM      MDM   Final diagnoses:  None    Patient presents to the emergency department for palpitations which began at home without any exacerbating factors. Infectious workup is negative. Potassium is low at 3.3, this was replaced in the emergency department. She has had no recurrence of her symptoms, EKG does not show any ischemia. She has been observed in the emergency department, she appears well-developed and no acute distress. Vital signs were within her normal limits and she is safe for discharge. Patient was informed of INR results of 1.5, encouraged to continue medications and follow-up with her doctor within 3 days for repeat check.   I personally performed the services described in this documentation, which was scribed in my presence. The recorded information has been reviewed and is accurate.      Tomasita Crumble, MD 09/25/15 [phone]

## 2015-09-25 NOTE — Discharge Instructions (Signed)
Atrial Fibrillation Ms. Ingber, Your INR today is 1.55.  Continue to take your coumadin and see your doctor within 3 days for close follow up and repeat check.  Your potassium was also low and was replaced.  Come back to the ED immediately if symptoms worsen.  Thank you. Atrial fibrillation is a type of heartbeat that is irregular or fast (rapid). If you have this condition, your heart keeps quivering in a weird (chaotic) way. This condition can make it so your heart cannot pump blood normally. Having this condition gives a person more risk for stroke, heart failure, and other heart problems. There are different types of atrial fibrillation. Talk with your doctor to learn about the type that you have. HOME CARE  Take over-the-counter and prescription medicines only as told by your doctor.  If your doctor prescribed a blood-thinning medicine, take it exactly as told. Taking too much of it can cause bleeding. If you do not take enough of it, you will not have the protection that you need against stroke and other problems.  Do not use any tobacco products. These include cigarettes, chewing tobacco, and e-cigarettes. If you need help quitting, ask your doctor.  If you have apnea (obstructive sleep apnea), manage it as told by your doctor.  Do not drink alcohol.  Do not drink beverages that have caffeine. These include coffee, soda, and tea.  Maintain a healthy weight. Do not use diet pills unless your doctor says they are safe for you. Diet pills may make heart problems worse.  Follow diet instructions as told by your doctor.  Exercise regularly as told by your doctor.  Keep all follow-up visits as told by your doctor. This is important. GET HELP IF:  You notice a change in the speed, rhythm, or strength of your heartbeat.  You are taking a blood-thinning medicine and you notice more bruising.  You get tired more easily when you move or exercise. GET HELP RIGHT AWAY IF:  You have pain  in your chest or your belly (abdomen).  You have sweating or weakness.  You feel sick to your stomach (nauseous).  You notice blood in your throw up (vomit), poop (stool), or pee (urine).  You are short of breath.  You suddenly have swollen feet and ankles.  You feel dizzy.  Your suddenly get weak or numb in your face, arms, or legs, especially if it happens on one side of your body.  You have trouble talking, trouble understanding, or both.  Your face or your eyelid droops on one side. These symptoms may be an emergency. Do not wait to see if the symptoms will go away. Get medical help right away. Call your local emergency services (911 in the [place] Do not drive yourself to the hospital.   This information is not intended to replace advice given to you by your health care provider. Make sure you discuss any questions you have with your health care provider.   Document Released: 05/27/2008 Document Revised: 05/09/2015 Document Reviewed: 12/13/2014 Elsevier Interactive Patient Education [employer]

## 2015-10-01 ENCOUNTER — Telehealth: Payer: Self-pay | Admitting: Cardiology

## 2015-10-01 NOTE — Telephone Encounter (Signed)
Received records from [facility] for appointment on 10/11/15 with Dr Herbie Baltimore.  Records given to [facility] (medical records) for Dr Elissa Hefty schedule on 10/11/15. lp

## 2015-10-03 ENCOUNTER — Ambulatory Visit (INDEPENDENT_AMBULATORY_CARE_PROVIDER_SITE_OTHER): Payer: Medicare Other | Admitting: Pharmacist Clinician (PhC)/ Clinical Pharmacy Specialist

## 2015-10-03 DIAGNOSIS — Z7901 Long term (current) use of anticoagulants: Secondary | ICD-10-CM | POA: Diagnosis not present

## 2015-10-03 DIAGNOSIS — I480 Paroxysmal atrial fibrillation: Secondary | ICD-10-CM | POA: Diagnosis not present

## 2015-10-03 LAB — POCT INR: INR: 3.4

## 2015-10-11 ENCOUNTER — Encounter: Payer: Self-pay | Admitting: Cardiology

## 2015-10-11 ENCOUNTER — Ambulatory Visit (INDEPENDENT_AMBULATORY_CARE_PROVIDER_SITE_OTHER): Payer: Medicare Other | Admitting: Pharmacist Clinician (PhC)/ Clinical Pharmacy Specialist

## 2015-10-11 ENCOUNTER — Ambulatory Visit (INDEPENDENT_AMBULATORY_CARE_PROVIDER_SITE_OTHER): Payer: Medicare Other | Admitting: Cardiology

## 2015-10-11 VITALS — BP 138/82 | HR 64 | Ht 64.25 in | Wt 138.0 lb

## 2015-10-11 DIAGNOSIS — I10 Essential (primary) hypertension: Secondary | ICD-10-CM | POA: Diagnosis not present

## 2015-10-11 DIAGNOSIS — Z7901 Long term (current) use of anticoagulants: Secondary | ICD-10-CM | POA: Diagnosis not present

## 2015-10-11 DIAGNOSIS — I480 Paroxysmal atrial fibrillation: Secondary | ICD-10-CM

## 2015-10-11 DIAGNOSIS — E7800 Pure hypercholesterolemia, unspecified: Secondary | ICD-10-CM

## 2015-10-11 LAB — POCT INR: INR: 1.6

## 2015-10-11 MED ORDER — METOPROLOL SUCCINATE ER 25 MG PO TB24: ORAL_TABLET | ORAL | Status: DC

## 2015-10-11 NOTE — Patient Instructions (Signed)
IF ANY BREAKTHROUGH WITH IRREGULAR HEART RATE MAY TAKE AN EXTRA METOPROLOL TABLET  AND  DOUBLE FLECAINIDE DOSE WAIT AT LEAST AN HOUR TO SEE IF RATE SLOWS DOAN BEFORE GOING TO ER.  NO OTHER CHANGES WITH CURRENT MEDICATIONS  Your physician wants you to follow-up in 6 MONTHS WITH DR HARDING. You will receive a reminder letter in the mail two months in advance. If you don't receive a letter, please call our office to schedule the follow-up appointment.  If you need a refill on your cardiac medications before your next appointment, please call your pharmacy.

## 2015-10-11 NOTE — Progress Notes (Signed)
PCP: Ellsworth Lennox, MD  Clinic Note: Chief Complaint  Patient presents with  . Follow-up    no chest pain, no shortness of breath, no swelling, no cramping, some dizziness  . Atrial Fibrillation    HPI: Donna Simmons is a 75 y.o. female with a PMH below who presents today for ~6-8 month f/u for PAF, HTN & Freq PAC/PVCs. I first met her in April 2012 when Donna Simmons presented with symptoms concerning for unstable angina/mild troponin elevation in the setting of atrial fibrillation. Donna Simmons had normal coronary arteries, so the elevated troponin was thought to be related to mild heart failure symptoms and A. fib RVR  Donna Simmons was last seen in June 2016.  Recent Hospitalizations:  ER Jan 24 - for rapid palpitations --> resolved en route to hospital. INR was sub therapeutic.  Studies Reviewed: none  Interval History: Donna Simmons presents today doing quite well overall from a cardiac standpoint. Donna Simmons had some rapid palpitations back in January did take an extra dose of Toprol prior to going to the emergency room. Upon arrival to the emergency room, her symptoms had resolved. That is really the only episode of rapid heartbeats that has been lasting more than a minute since I last saw her. The biggest complaint Donna Simmons has currently is that Donna Simmons's been having some pretty bad GERD symptoms. Donna Simmons had to prop the head of her bed up a little bit. Other than that, no chest symptoms. Donna Simmons is actually supposed to going to see her gastroenterologist soon.  Cardiac review of symptoms: Palpitation episode noted above No chest pain or shortness of breath with rest or exertion.  No PND, orthopnea or edema.  No lightheadedness, dizziness, weakness or syncope/near syncope. No TIA/amaurosis fugax symptoms. No melena, hematochezia, hematuria, or epstaxis. No claudication.  ROS: A comprehensive was performed. Review of Systems  Constitutional: Negative for fever, chills, weight loss and malaise/fatigue.  HENT:  Negative for nosebleeds.   Cardiovascular: Negative.   Gastrointestinal: Positive for heartburn, nausea and abdominal pain. Negative for vomiting, blood in stool and melena.  Musculoskeletal: Negative.   Neurological: Negative for weakness and headaches.  Endo/Heme/Allergies: Does not bruise/bleed easily.  Psychiatric/Behavioral: Negative.   All other systems reviewed and are negative.   Past Medical History  Diagnosis Date  . Paroxysmal atrial fibrillation ([place]) 12/27/2008  . Hypertension   . Hypercholesteremia   . GERD (gastroesophageal reflux disease)   . Palpitations 01/10/2009    14 day monitor- some sinus tachycardia, PVCs  . Allergy   . Heart murmur     No significant valvular lesion noted on echo.    Past Surgical History  Procedure Laterality Date  . Breast surgery    . Fracture surgery    . Abdominal hysterectomy  1980  . Cardiac catheterization  12/16/2010    no evidence of CAD to explain anginal pain w/ positive troponin.  potential etiology is breakthrough AF  . Transthoracic echocardiogram  April 2010    Echo - EF >55; RV mils/mod dilated, RV systolic function mildly reduced; RA mild/moderately dilated; moderate pulmonary hypertension; mild tricuspid regurgitation  . Nm myoview ltd  April 2010    EF 64%, normal pattern of perfusion in all regions, no scintigraphic evidence of inducible ischemia; no significant wall motion abnormalities; EKG negative for ischemia; no significant change from last study; low risk scan    Prior to Admission medications   Medication Sig Start Date End Date Taking? Authorizing Provider  atorvastatin (LIPITOR) 10 MG tablet Take  1 tablet (10 mg total) by mouth daily with lunch. 02/27/15  Yes Leonie Man, MD  flecainide (TAMBOCOR) 50 MG tablet Take 1 tablet (50 mg total) by mouth 2 (two) times daily. 02/27/15  Yes Leonie Man, MD  lisinopril (PRINIVIL,ZESTRIL) 10 MG tablet Take 1 tablet (10 mg total) by mouth daily. 02/27/15  Yes Leonie Man, MD  metoprolol succinate (TOPROL-XL) 25 MG 24 hr tablet Take 1 tablet (25 mg) daily before dinner, may take an additional 1/2 tablet (12.5 mg) 6 hours after as needed. Patient taking differently: Take 12.5-25 mg by mouth See admin instructions. Take 1 tablet (25 mg) daily before dinner, may take an additional 1/2 tablet (12.5 mg) 6 hours after as needed for racing heart. 02/27/15  Yes Leonie Man, MD  Multiple Vitamin (MULTIVITAMIN WITH MINERALS) TABS tablet Take 1 tablet by mouth daily with lunch. Centrum Silver   Yes Historical Provider, MD  omeprazole (PRILOSEC) 20 MG capsule Take 1 capsule (20 mg total) by mouth daily. Take BID for 14 days every 4 months 09/22/15  Yes Araceli Bouche, PA  warfarin (COUMADIN) 5 MG tablet Take 1 tablet by mouth daily or as directed by coumadin clinic Patient taking differently: Take 2.5-5 mg by mouth daily. Take 2.45m on Monday, Wednesday and Friday. All other days take 534m6/28/16  Yes DaLeonie ManMD   Allergies  Allergen Reactions  . Antihistamines, Chlorpheniramine-Type Palpitations    Makes heart beat fast    Social History   Social History  . Marital Status: Married    Spouse Name: N/A  . Number of Children: N/A  . Years of Education: N/A   Social History Main Topics  . Smoking status: Never Smoker   . Smokeless tobacco: None  . Alcohol Use: No  . Drug Use: No  . Sexual Activity: Yes   Other Topics Concern  . None   Social History Narrative   Married with no children. Walks several times a week at least 3-4 times a week about 2 miles a time. Does not smoke and does not drink.   Family History  Problem Relation Age of Onset  . Arthritis Mother     OSTEO  . Hypertension Mother   . Dementia Mother   . Heart disease Father   . Hypertension Sister   . Dementia Sister   . Cancer - Lung Brother   . Cancer Maternal Grandmother   . Heart disease Maternal Grandfather   . Cancer - Lung Brother   . Hypertension Brother   .  Cancer - Prostate Brother     Wt Readings from Last 3 Encounters:  10/11/15 138 lb (62.596 kg)  09/22/15 137 lb (62.143 kg)  08/29/15 138 lb 6.4 oz (62.778 kg)    PHYSICAL EXAM BP 138/82 mmHg  Pulse 64  Ht 5' 4.25" (1.632 m)  Wt 138 lb (62.596 kg)  BMI 23.50 kg/m2   General appearance: alert, cooperative, appears ated age, no distress and well-nourished, well-groomed. Pleasant mood and affect. Appears younger than his stated age HE2NC/AT, EOMI, MMM, anicteric sclera Neck: no adenopathy, no carotid bruit and no JVD Lungs: clear to auscultation bilaterally, normal percussion bilaterally and non-labored. No W/R/R Heart: regular rate and rhythm, S1, S2 normal, no murmur, click, rub or gallop; nondisplaced PMI Abdomen: soft, non-tender; bowel sounds normal; no masses,  no organomegaly; no HJR Extremities: extremities normal, atraumatic, no cyanosis, and edema Pulses: 2+ and symmetric; Neurologic: Mental status: Alert, oriented, thought  content appropriate; Cranial nerves: normal (II-XII grossly intact)   Adult ECG Report - not checked  Other studies Reviewed: Additional studies/ records that were reviewed today include:  Recent Labs:  none    Chemistry      Component Value Date/Time   NA 140 09/25/2015 0313   K 3.3* 09/25/2015 0313   CL 104 09/25/2015 0313   CO2 25 09/25/2015 0313   BUN <5* 09/25/2015 0313   CREATININE 0.92 09/25/2015 0313      Component Value Date/Time   CALCIUM 8.8* 09/25/2015 0313   ALKPHOS 53 08/29/2015 0910   AST 15 08/29/2015 0910   ALT 8 08/29/2015 0910   BILITOT 0.6 08/29/2015 0910      ASSESSMENT / PLAN: Problem List Items Addressed This Visit    Paroxysmal atrial fibrillation ([place]) - Primary (Chronic)    Pretty well-controlled. Donna Simmons may have had one breakthrough episode in January. We talked about when necessary options. Donna Simmons is already on flecainide 50 twice a day in addition to Toprol 25 mg. The plan had been converted take an additional  half dose of Toprol for breakthrough. I think we can also use when necessary flecainide 100 mg for breakthrough spells as well. This would be in conjunction with the additional dose of beta blocker. If Donna Simmons is not successful breaking it with that effort, then perhaps Donna Simmons should come to the emergency room.  Donna Simmons is on warfarin. Donna Simmons will discuss potential conversion to a DOAC with our clinical pharmacist.      Relevant Medications   metoprolol succinate (TOPROL-XL) 25 MG 24 hr tablet   Long term current use of anticoagulant therapy (Chronic)    Has been on warfarin. Will discuss with clinical pharmacist options for DOAC. No bleeding issues.      Hypercholesteremia (Chronic)    Monitored by PCP. Donna Simmons is on low-dose statin.      Relevant Medications   metoprolol succinate (TOPROL-XL) 25 MG 24 hr tablet   Essential hypertension (Chronic)    Well-controlled blood pressure. On stable dose of lisinopril and Toprol.      Relevant Medications   metoprolol succinate (TOPROL-XL) 25 MG 24 hr tablet      Current medicines are reviewed at length with the patient today. (+/- concerns) questions about DOAC therapy, questions breakthrough therapy  The following changes have been made:  IF ANY [place] AN EXTRA METOPROLOL TABLET  AND  DOUBLE FLECAINIDE DOSE WAIT AT LEAST AN HOUR TO SEE IF RATE SLOWS [place] TO ER.  NO OTHER CHANGES WITH CURRENT MEDICATIONS  Your physician wants you to follow-up in [place].   Studies Ordered:   No orders of the defined types were placed in this encounter.      Leonie Man, M.D., M.S. Interventional Cardiologist   Pager # [phone] Phone # [phone] [address] [place], [place] [address]

## 2015-10-13 ENCOUNTER — Encounter: Payer: Self-pay | Admitting: Cardiology

## 2015-10-13 NOTE — Assessment & Plan Note (Signed)
Monitored by PCP. She is on low-dose statin.

## 2015-10-13 NOTE — Assessment & Plan Note (Signed)
Well-controlled blood pressure. On stable dose of lisinopril and Toprol.

## 2015-10-13 NOTE — Assessment & Plan Note (Signed)
Pretty well-controlled. She may have had one breakthrough episode in January. We talked about when necessary options. She is already on flecainide 50 twice a day in addition to Toprol 25 mg. The plan had been converted take an additional half dose of Toprol for breakthrough. I think we can also use when necessary flecainide 100 mg for breakthrough spells as well. This would be in conjunction with the additional dose of beta blocker. If she is not successful breaking it with that effort, then perhaps she should come to the emergency room.  She is on warfarin. She will discuss potential conversion to a DOAC with our clinical pharmacist.

## 2015-10-13 NOTE — Assessment & Plan Note (Signed)
Has been on warfarin. Will discuss with clinical pharmacist options for DOAC. No bleeding issues.

## 2015-10-31 ENCOUNTER — Encounter: Payer: Medicare Other | Admitting: Pharmacist Clinician (PhC)/ Clinical Pharmacy Specialist

## 2015-11-05 ENCOUNTER — Ambulatory Visit (INDEPENDENT_AMBULATORY_CARE_PROVIDER_SITE_OTHER): Payer: Medicare Other | Admitting: Pharmacist Clinician (PhC)/ Clinical Pharmacy Specialist

## 2015-11-05 ENCOUNTER — Encounter: Payer: Self-pay | Admitting: Physician Assistant

## 2015-11-05 DIAGNOSIS — Z7901 Long term (current) use of anticoagulants: Secondary | ICD-10-CM

## 2015-11-05 DIAGNOSIS — I480 Paroxysmal atrial fibrillation: Secondary | ICD-10-CM

## 2015-11-05 DIAGNOSIS — R1310 Dysphagia, unspecified: Secondary | ICD-10-CM | POA: Insufficient documentation

## 2015-11-05 LAB — POCT INR: INR: 4.5

## 2015-11-20 ENCOUNTER — Encounter: Payer: Medicare Other | Admitting: Pharmacist Clinician (PhC)/ Clinical Pharmacy Specialist

## 2015-11-23 ENCOUNTER — Ambulatory Visit (INDEPENDENT_AMBULATORY_CARE_PROVIDER_SITE_OTHER): Payer: Medicare Other | Admitting: Pharmacist Clinician (PhC)/ Clinical Pharmacy Specialist

## 2015-11-23 DIAGNOSIS — Z7901 Long term (current) use of anticoagulants: Secondary | ICD-10-CM

## 2015-11-23 DIAGNOSIS — I480 Paroxysmal atrial fibrillation: Secondary | ICD-10-CM

## 2015-11-23 LAB — POCT INR: INR: 2.9

## 2015-12-07 ENCOUNTER — Ambulatory Visit (INDEPENDENT_AMBULATORY_CARE_PROVIDER_SITE_OTHER): Payer: Medicare Other | Admitting: Physician Assistant

## 2015-12-07 VITALS — BP 130/76 | HR 73 | Temp 97.5°F | Resp 16 | Ht 65.0 in | Wt 136.0 lb

## 2015-12-07 DIAGNOSIS — J301 Allergic rhinitis due to pollen: Secondary | ICD-10-CM

## 2015-12-07 MED ORDER — FLUTICASONE PROPIONATE 50 MCG/ACT NA SUSP: 2.0000 | Freq: Every day | NASAL | Status: DC

## 2015-12-07 NOTE — Progress Notes (Signed)
12/07/2015 3:12 PM   DOB: 01/19/1941 / MRN: [id number]  SUBJECTIVE:  Donna Simmons is a 75 y.o. female with a history of seasonal allergies presenting for nasal congestion, sneezing, throat itching, and ear pressure.  She has used flonase in the past for these symptoms with excellent relief, however her prescription has run out.  She denies HA, fever, chills, teeth pain.    She is allergic to antihistamines, chlorpheniramine-type.   She  has a past medical history of Paroxysmal atrial fibrillation ([facility]) (12/27/2008); Hypertension; Hypercholesteremia; GERD (gastroesophageal reflux disease); Palpitations (01/10/2009); Allergy; and Heart murmur.    She  reports that she has never smoked. She has never used smokeless tobacco. She reports that she does not drink alcohol or use illicit drugs. She  reports that she currently engages in sexual activity. The patient  has past surgical history that includes Breast surgery; Fracture surgery; Abdominal hysterectomy (1980); Cardiac catheterization (12/16/2010); transthoracic echocardiogram (April 2010); and [facility] MYOVIEW LTD (April 2010).  Her family history includes Arthritis in her mother; Cancer in her maternal grandmother; Cancer - Lung in her brother and brother; Cancer - Prostate in her brother; Dementia in her mother and sister; Heart disease in her father and maternal grandfather; Hypertension in her brother, mother, and sister.  Review of Systems  Constitutional: Negative for fever and chills.  HENT: Positive for congestion. Negative for hearing loss and sore throat.   Eyes: Negative for pain.  Respiratory: Negative for cough.   Cardiovascular: Negative for chest pain and palpitations.  Musculoskeletal: Negative for neck pain.  Neurological: Negative for dizziness and headaches.    Problem list and medications reviewed and updated by myself where necessary, and exist elsewhere in the encounter.   OBJECTIVE:  BP 130/76 mmHg  Pulse 73  Temp(Src)  97.5 F (36.4 C) (Oral)  Resp 16  Ht  (1.651 m)  Wt 136 lb (61.689 kg)  BMI 22.63 kg/m2  SpO2 97%  Physical Exam  Constitutional: She is oriented to person, place, and time. She appears well-nourished. No distress.  HENT:  Nose: Mucosal edema (bluish hue) present. No rhinorrhea. Right sinus exhibits no maxillary sinus tenderness and no frontal sinus tenderness. Left sinus exhibits no maxillary sinus tenderness and no frontal sinus tenderness.  Mouth/Throat: Uvula is midline, oropharynx is clear and moist and mucous membranes are normal.  Eyes: EOM are normal. Pupils are equal, round, and reactive to light.  Cardiovascular: Normal rate and regular rhythm.   Pulmonary/Chest: Effort normal and breath sounds normal.  Abdominal: She exhibits no distension.  Neurological: She is alert and oriented to person, place, and time. No cranial nerve deficit. Gait normal.  Skin: Skin is dry. She is not diaphoretic.  Psychiatric: She has a normal mood and affect.  Vitals reviewed.   No results found for this or any previous visit (from the past 72 hour(s)).  No results found.  ASSESSMENT AND PLAN  Donna Simmons was seen today for sinus problem.  Diagnoses and all orders for this visit:  Allergic rhinitis due to pollen: She is sensitive to antihistamines given her history of afib, but has used claritin in the past.  Advised that she try 1/2 to 1 tab daily while flonase is loading.   -     fluticasone (FLONASE) 50 MCG/ACT nasal spray; Place 2 sprays into both nostrils daily.    The patient was advised to call or return to clinic if she does not see an improvement in symptoms or to seek the  care of the closest emergency department if she worsens with the above plan.   Deliah BostonMichael Gabby Rackers, MHS, PA-C [facility] 12/07/2015 3:12 PM

## 2015-12-07 NOTE — Patient Instructions (Addendum)
Please start using sunscreen daily.  Try 1/2 to 1 claritin daily while waiting for flonase to start working effectively.      IF you received an x-ray today, you will receive an invoice from [facility]. Please contact [facility] at [phone] with questions or concerns regarding your invoice.   IF you received labwork today, you will receive an invoice from [facility]. Please contact [facility] at [phone] with questions or concerns regarding your invoice.   Our billing staff will not be able to assist you with questions regarding bills from these companies.  You will be contacted with the lab results as soon as they are available. The fastest way to get your results is to activate your My Chart account. Instructions are located on the last page of this paperwork. If you have not heard from us regarding the results in 2 weeks, please contact this office.

## 2015-12-17 ENCOUNTER — Ambulatory Visit (INDEPENDENT_AMBULATORY_CARE_PROVIDER_SITE_OTHER): Payer: Medicare Other | Admitting: Pharmacist Clinician (PhC)/ Clinical Pharmacy Specialist

## 2015-12-17 DIAGNOSIS — I480 Paroxysmal atrial fibrillation: Secondary | ICD-10-CM | POA: Diagnosis not present

## 2015-12-17 DIAGNOSIS — Z7901 Long term (current) use of anticoagulants: Secondary | ICD-10-CM

## 2015-12-17 LAB — POCT INR: INR: 1.5

## 2015-12-20 ENCOUNTER — Encounter (HOSPITAL_COMMUNITY): Payer: Self-pay | Admitting: Emergency Medicine

## 2015-12-20 ENCOUNTER — Ambulatory Visit (HOSPITAL_COMMUNITY)
Admission: EM | Admit: 2015-12-20 | Discharge: 2015-12-20 | Disposition: A | Discharge status: Home / Self Care | Payer: Medicare Other | Attending: Family Medicine | Admitting: Family Medicine

## 2015-12-20 DIAGNOSIS — I10 Essential (primary) hypertension: Secondary | ICD-10-CM | POA: Insufficient documentation

## 2015-12-20 DIAGNOSIS — I480 Paroxysmal atrial fibrillation: Secondary | ICD-10-CM | POA: Diagnosis not present

## 2015-12-20 DIAGNOSIS — K219 Gastro-esophageal reflux disease without esophagitis: Secondary | ICD-10-CM | POA: Insufficient documentation

## 2015-12-20 DIAGNOSIS — N390 Urinary tract infection, site not specified: Secondary | ICD-10-CM | POA: Diagnosis present

## 2015-12-20 DIAGNOSIS — Z888 Allergy status to other drugs, medicaments and biological substances status: Secondary | ICD-10-CM | POA: Diagnosis not present

## 2015-12-20 DIAGNOSIS — E7800 Pure hypercholesterolemia, unspecified: Secondary | ICD-10-CM | POA: Insufficient documentation

## 2015-12-20 DIAGNOSIS — N952 Postmenopausal atrophic vaginitis: Secondary | ICD-10-CM | POA: Diagnosis not present

## 2015-12-20 DIAGNOSIS — Z7901 Long term (current) use of anticoagulants: Secondary | ICD-10-CM | POA: Diagnosis not present

## 2015-12-20 DIAGNOSIS — Z9889 Other specified postprocedural states: Secondary | ICD-10-CM | POA: Diagnosis not present

## 2015-12-20 DIAGNOSIS — Z79899 Other long term (current) drug therapy: Secondary | ICD-10-CM | POA: Diagnosis not present

## 2015-12-20 LAB — POCT I-STAT, CHEM 8
BUN: 4 mg/dL — ABNORMAL LOW (ref 6–20)
Calcium, Ion: 1.2 mmol/L (ref 1.13–1.30)
Chloride: 106 mmol/L (ref 101–111)
Creatinine, Ser: 0.8 mg/dL (ref 0.44–1.00)
Glucose, Bld: 92 mg/dL (ref 65–99)
HEMATOCRIT: 43 % (ref 36.0–46.0)
HEMOGLOBIN: 14.6 g/dL (ref 12.0–15.0)
POTASSIUM: 4.2 mmol/L (ref 3.5–5.1)
SODIUM: 142 mmol/L (ref 135–145)
TCO2: 26 mmol/L (ref 0–100)

## 2015-12-20 LAB — POCT URINALYSIS DIP (DEVICE)
Bilirubin Urine: NEGATIVE
GLUCOSE, UA: NEGATIVE mg/dL
Ketones, ur: NEGATIVE mg/dL
LEUKOCYTES UA: NEGATIVE
NITRITE: NEGATIVE
Protein, ur: NEGATIVE mg/dL
SPECIFIC GRAVITY, URINE: 1.015 (ref 1.005–1.030)
UROBILINOGEN UA: 0.2 mg/dL (ref 0.0–1.0)
pH: 7 (ref 5.0–8.0)

## 2015-12-20 MED ORDER — ESTROGENS, CONJUGATED 0.625 MG/GM VA CREA: 0.5000 | TOPICAL_CREAM | VAGINAL | Status: DC

## 2015-12-20 NOTE — ED Provider Notes (Signed)
CSN: [id number]     Arrival date & time 12/20/15  1333 History   First MD Initiated Contact with Patient 12/20/15 1521     Chief Complaint  Patient presents with  . Urinary Tract Infection   (Consider location/radiation/quality/duration/timing/severity/associated sxs/prior Treatment) Patient is a 75 y.o. female presenting with urinary tract infection and frequency.  Urinary Tract Infection Associated symptoms: no abdominal pain   Urinary Frequency This is a new problem. The current episode started 6 to 12 hours ago (has bladder prolapse s/p hyst and today developed uti sx.). The problem has not changed since onset.Pertinent negatives include no abdominal pain.    Past Medical History  Diagnosis Date  . Paroxysmal atrial fibrillation ([facility]) 12/27/2008  . Hypertension   . Hypercholesteremia   . GERD (gastroesophageal reflux disease)   . Palpitations 01/10/2009    14 day monitor- some sinus tachycardia, PVCs  . Allergy   . Heart murmur     No significant valvular lesion noted on echo.   Past Surgical History  Procedure Laterality Date  . Breast surgery    . Fracture surgery    . Abdominal hysterectomy  1980  . Cardiac catheterization  12/16/2010    no evidence of CAD to explain anginal pain w/ positive troponin.  potential etiology is breakthrough AF  . Transthoracic echocardiogram  April 2010    Echo - EF >55; RV mils/mod dilated, RV systolic function mildly reduced; RA mild/moderately dilated; moderate pulmonary hypertension; mild tricuspid regurgitation  . Nm myoview ltd  April 2010    EF 64%, normal pattern of perfusion in all regions, no scintigraphic evidence of inducible ischemia; no significant wall motion abnormalities; EKG negative for ischemia; no significant change from last study; low risk scan   Family History  Problem Relation Age of Onset  . Arthritis Mother     OSTEO  . Hypertension Mother   . Dementia Mother   . Heart disease Father   . Hypertension Sister    . Dementia Sister   . Cancer - Lung Brother   . Cancer Maternal Grandmother   . Heart disease Maternal Grandfather   . Cancer - Lung Brother   . Hypertension Brother   . Cancer - Prostate Brother    Social History  Substance Use Topics  . Smoking status: Never Smoker   . Smokeless tobacco: Never Used  . Alcohol Use: No   OB History    No data available     Review of Systems  Constitutional: Negative.   Gastrointestinal: Negative.  Negative for abdominal pain.  Genitourinary: Positive for dysuria, urgency and frequency. Negative for pelvic pain.  All other systems reviewed and are negative.   Allergies  Antihistamines, chlorpheniramine-type  Home Medications   Prior to Admission medications   Medication Sig Start Date End Date Taking? Authorizing Provider  atorvastatin (LIPITOR) 10 MG tablet Take 1 tablet (10 mg total) by mouth daily with lunch. 02/27/15   Marykay Lexavid W Harding, MD  conjugated estrogens (PREMARIN) vaginal cream Place 0.5 Applicatorfuls vaginally 2 (two) times a week. 12/20/15   Linna HoffJames D Alyssamarie Mounsey, MD  flecainide (TAMBOCOR) 50 MG tablet Take 1 tablet (50 mg total) by mouth 2 (two) times daily. 02/27/15   Marykay Lexavid W Harding, MD  fluticasone [facility](FLONASE) 50 MCG/ACT nasal spray Place 2 sprays into both nostrils daily. 12/07/15   Ofilia NeasMichael L Clark, PA-C  lisinopril (PRINIVIL,ZESTRIL) 10 MG tablet Take 1 tablet (10 mg total) by mouth daily. 02/27/15   Marykay Lexavid W Harding, MD  metoprolol succinate (TOPROL-XL) 25 MG 24 hr tablet Take 1 tablet (25 mg) daily before dinner, may take an additional 1/2 tablet (12.5 mg) 6 hours after as needed. 10/11/15   Marykay Lex, MD  Multiple Vitamin (MULTIVITAMIN WITH MINERALS) TABS tablet Take 1 tablet by mouth daily with lunch. Centrum Silver    Historical Provider, MD  omeprazole (PRILOSEC) 20 MG capsule Take 1 capsule (20 mg total) by mouth daily. Take BID for 14 days every 4 months 09/22/15   Raelyn Ensign, PA  warfarin (COUMADIN) 5 MG tablet Take 1 tablet  by mouth daily or as directed by coumadin clinic Patient taking differently: Take 2.5-5 mg by mouth daily. Take 2.5mg  on Monday, Wednesday and Friday. All other days take  02/27/15   Marykay Lex, MD   Meds Ordered and Administered this Visit  Medications - No data to display  BP 180/105 mmHg  Pulse 78  Temp(Src) 97.6 F (36.4 C) (Oral)  SpO2 96% No data found.   Physical Exam  Constitutional: She is oriented to person, place, and time. She appears well-developed and well-nourished.  Cardiovascular: Normal rate and normal heart sounds.   Pulmonary/Chest: Effort normal and breath sounds normal.  Abdominal: Soft. Bowel sounds are normal. She exhibits no distension and no mass. There is no tenderness. There is no rebound and no guarding.  Neurological: She is alert and oriented to person, place, and time.  Skin: Skin is warm and dry.  Nursing note and vitals reviewed.   ED Course  Procedures (including critical care time)  Labs Review Labs Reviewed  POCT URINALYSIS DIP (DEVICE) - Abnormal; Notable for the following:    Hgb urine dipstick MODERATE (*)    All other components within normal limits  POCT I-STAT, CHEM 8 - Abnormal; Notable for the following:    BUN 4 (*)    All other components within normal limits  URINE CULTURE   U/a [os bld. i-stat wnl. Imaging Review No results found.   Visual Acuity Review  Right Eye Distance:   Left Eye Distance:   Bilateral Distance:    Right Eye Near:   Left Eye Near:    Bilateral Near:         MDM   1. Postmenopausal atrophic vaginitis        Linna Hoff, MD 12/20/15 1659

## 2015-12-20 NOTE — ED Notes (Signed)
Patient her for concerns for uti, increase in urination, lightheaded and decreased energy

## 2015-12-22 LAB — URINE CULTURE: Special Requests: NORMAL

## 2015-12-31 ENCOUNTER — Encounter: Payer: Medicare Other | Admitting: Pharmacist Clinician (PhC)/ Clinical Pharmacy Specialist

## 2016-01-04 ENCOUNTER — Encounter: Payer: Medicare Other | Admitting: Pharmacist Clinician (PhC)/ Clinical Pharmacy Specialist

## 2016-01-14 ENCOUNTER — Ambulatory Visit (INDEPENDENT_AMBULATORY_CARE_PROVIDER_SITE_OTHER): Payer: Medicare Other | Admitting: Pharmacist

## 2016-01-14 DIAGNOSIS — Z7901 Long term (current) use of anticoagulants: Secondary | ICD-10-CM

## 2016-01-14 DIAGNOSIS — I480 Paroxysmal atrial fibrillation: Secondary | ICD-10-CM | POA: Diagnosis not present

## 2016-01-14 LAB — POCT INR: INR: 3.5

## 2016-01-14 MED ORDER — APIXABAN 5 MG PO TABS: 5.0000 mg | ORAL_TABLET | Freq: Two times a day (BID) | ORAL | Status: DC

## 2016-03-10 ENCOUNTER — Encounter: Payer: Self-pay | Admitting: Cardiology

## 2016-03-10 ENCOUNTER — Telehealth: Payer: Self-pay | Admitting: Nurse Practitioner

## 2016-03-13 NOTE — Telephone Encounter (Signed)
Closed encounter °

## 2016-03-20 ENCOUNTER — Ambulatory Visit (INDEPENDENT_AMBULATORY_CARE_PROVIDER_SITE_OTHER): Payer: Medicare Other | Admitting: Physician Assistant

## 2016-03-20 VITALS — BP 122/72 | HR 77 | Temp 98.3°F | Resp 17 | Ht 64.5 in | Wt 135.0 lb

## 2016-03-20 DIAGNOSIS — I10 Essential (primary) hypertension: Secondary | ICD-10-CM

## 2016-03-20 LAB — CBC
HCT: 39.6 % (ref 35.0–45.0)
HEMOGLOBIN: 12.4 g/dL (ref 11.7–15.5)
MCH: 25.1 pg — AB (ref 27.0–33.0)
MCHC: 31.3 g/dL — ABNORMAL LOW (ref 32.0–36.0)
MCV: 80.2 fL (ref 80.0–100.0)
MPV: 10.5 fL (ref 7.5–12.5)
Platelets: 252 10*3/uL (ref 140–400)
RBC: 4.94 MIL/uL (ref 3.80–5.10)
RDW: 15.8 % — ABNORMAL HIGH (ref 11.0–15.0)
WBC: 2.8 10*3/uL — ABNORMAL LOW (ref 3.8–10.8)

## 2016-03-20 LAB — COMPLETE METABOLIC PANEL WITH GFR
ALBUMIN: 3.9 g/dL (ref 3.6–5.1)
ALK PHOS: 47 U/L (ref 33–130)
ALT: 6 U/L (ref 6–29)
AST: 16 U/L (ref 10–35)
BILIRUBIN TOTAL: 0.6 mg/dL (ref 0.2–1.2)
BUN: 9 mg/dL (ref 7–25)
CO2: 28 mmol/L (ref 20–31)
CREATININE: 0.89 mg/dL (ref 0.60–0.93)
Calcium: 9.2 mg/dL (ref 8.6–10.4)
Chloride: 104 mmol/L (ref 98–110)
GFR, EST AFRICAN AMERICAN: 75 mL/min (ref >=60)
GFR, EST NON AFRICAN AMERICAN: 65 mL/min (ref >=60)
GLUCOSE: 86 mg/dL (ref 65–99)
Potassium: 4 mmol/L (ref 3.5–5.3)
Sodium: 140 mmol/L (ref 135–146)
TOTAL PROTEIN: 6.6 g/dL (ref 6.1–8.1)

## 2016-03-20 MED ORDER — LISINOPRIL 10 MG PO TABS: 10.0000 mg | ORAL_TABLET | Freq: Every day | ORAL | Status: DC

## 2016-03-20 NOTE — Progress Notes (Signed)
03/20/2016 2:28 PM   DOB: 08-12-1941 / MRN: [id number]  SUBJECTIVE:  Donna Simmons is a 75 y.o. female presenting for medication refills.  She is out of her lisinopril at this time and typically receives this medication from cardiology.  She has a follow up with them in less than 30 days and plans to attend that appointment.  She denies leg swelling, orthopnea, chest pain, SOB, and DOE.  No vision changes.    She is requesting a refill of premarin cream which she uses on time weekly.  She has a history of hysterectomy and urinary bladder prolapse.  She is frequently incontinent of urine and reports she will use this after she has had an episode of nocturia, states that the medication helps with vaginal irritation. She also states that zinc oxide works just as well. She denies vaginal pain, dysuria, urgency, dysparunia and excessive vaginal dryness.   Immunization History  Administered Date(s) Administered  . Hepatitis A 03/22/1998  . Influenza Split 08/02/2012, 06/20/2014  . Influenza,inj,Quad PF,36+ Mos 07/21/2013, 06/04/2015  . Influenza-Unspecified 06/01/2014  . PPD Test 04/26/2013  . Pneumococcal Conjugate-13 08/29/2015  . Pneumococcal Polysaccharide-23 09/01/2005  . Td 03/22/1998, 07/31/2010  . Tdap 09/01/2009  . Zoster 08/02/2012     She is allergic to antihistamines, chlorpheniramine-type.   She  has a past medical history of Paroxysmal atrial fibrillation (HCC) (12/27/2008); Hypertension; Hypercholesteremia; GERD (gastroesophageal reflux disease); Palpitations (01/10/2009); Allergy; and Heart murmur.    She  reports that she has never smoked. She has never used smokeless tobacco. She reports that she does not drink alcohol or use illicit drugs. She  reports that she currently engages in sexual activity. The patient  has past surgical history that includes Breast surgery; Fracture surgery; Abdominal hysterectomy (1980); Cardiac catheterization (12/16/2010); transthoracic  echocardiogram (April 2010); and NM MYOVIEW LTD (April 2010).  Her family history includes Arthritis in her mother; Cancer in her maternal grandmother; Cancer - Lung in her brother and brother; Cancer - Prostate in her brother; Dementia in her mother and sister; Heart disease in her father and maternal grandfather; Hypertension in her brother, mother, and sister.  Review of Systems  Respiratory: Negative for cough.   Cardiovascular: Negative for chest pain, orthopnea and leg swelling.  Gastrointestinal: Negative for nausea.  Neurological: Negative for dizziness and headaches.    Problem list and medications reviewed and updated by myself where necessary, and exist elsewhere in the encounter.   OBJECTIVE:  BP 122/72 mmHg  Pulse 77  Temp(Src) 98.3 F (36.8 C) (Oral)  Resp 17  Ht 5' 4.5" (1.638 m)  Wt 135 lb (61.236 kg)  BMI 22.82 kg/m2  SpO2 98%  Physical Exam  Constitutional: She is oriented to person, place, and time.  Cardiovascular: Normal rate, regular rhythm and normal heart sounds.   No murmur heard. Pulmonary/Chest: Effort normal and breath sounds normal.  Neurological: She is alert and oriented to person, place, and time.  Skin: Skin is warm and dry.  Psychiatric: She has a normal mood and affect. Her behavior is normal.    Lab Results  Component Value Date   HGBA1C 5.3 07/22/2013   Lab Results  Component Value Date   ALT 8 08/29/2015   AST 15 08/29/2015   ALKPHOS 53 08/29/2015   BILITOT 0.6 08/29/2015   Lab Results  Component Value Date   NA 142 12/20/2015   K 4.2 12/20/2015   CL 106 12/20/2015   CO2 25 09/25/2015   Lab Results  Component Value Date   CREATININE 0.80 12/20/2015   Lab Results  Component Value Date   WBC 3.8* 09/25/2015   HGB 14.6 12/20/2015   HCT 43.0 12/20/2015   MCV 78.1 09/25/2015   PLT 214 09/25/2015   Lab Results  Component Value Date   TSH 1.307 12/23/2013   Lab Results  Component Value Date   CHOL 230* 08/29/2015    HDL 85 08/29/2015   LDLCALC 125 08/29/2015   TRIG 100 08/29/2015   CHOLHDL 2.7 08/29/2015     No results found for this or any previous visit (from the past 72 hour(s)).  No results found.  ASSESSMENT AND PLAN  Paiton was seen today for medication refill. I will hold on premarin cream for now to see how she is doing with zinc oxide only.    Diagnoses and all orders for this visit:  Well-controlled hypertension: Will get her a month of Lisinopril and I have encouraged her to keep her cards appointment.   -     CBC -     COMPLETE METABOLIC PANEL WITH GFR -     lisinopril (PRINIVIL,ZESTRIL) 10 MG tablet; Take 1 tablet (10 mg total) by mouth daily.   The patient was advised to call or return to clinic if she does not see an improvement in symptoms, or to seek the care of the closest emergency department if she worsens with the above plan.   Deliah Boston, MHS, PA-C [facility] 03/20/2016 2:28 PM

## 2016-03-24 LAB — PATHOLOGIST SMEAR REVIEW

## 2016-03-26 ENCOUNTER — Ambulatory Visit (INDEPENDENT_AMBULATORY_CARE_PROVIDER_SITE_OTHER): Payer: Medicare Other | Admitting: Physician Assistant

## 2016-03-26 ENCOUNTER — Telehealth: Payer: Self-pay

## 2016-03-26 DIAGNOSIS — D72819 Decreased white blood cell count, unspecified: Secondary | ICD-10-CM | POA: Diagnosis not present

## 2016-03-26 NOTE — Progress Notes (Signed)
Lab only  Deliah Boston, MS, PA-C 7:06 PM, 03/26/2016

## 2016-03-27 LAB — CBC
HEMATOCRIT: 38.6 % (ref 35.0–45.0)
Hemoglobin: 11.9 g/dL (ref 11.7–15.5)
MCH: 25.2 pg — ABNORMAL LOW (ref 27.0–33.0)
MCHC: 30.8 g/dL — AB (ref 32.0–36.0)
MCV: 81.6 fL (ref 80.0–100.0)
MPV: 10.7 fL (ref 7.5–12.5)
PLATELETS: 245 10*3/uL (ref 140–400)
RBC: 4.73 MIL/uL (ref 3.80–5.10)
RDW: 16.2 % — AB (ref 11.0–15.0)
WBC: 3.2 10*3/uL — AB (ref 3.8–10.8)

## 2016-03-27 LAB — PATHOLOGIST SMEAR REVIEW

## 2016-04-04 ENCOUNTER — Ambulatory Visit (INDEPENDENT_AMBULATORY_CARE_PROVIDER_SITE_OTHER): Payer: Medicare Other | Admitting: Nurse Practitioner

## 2016-04-04 ENCOUNTER — Encounter: Payer: Self-pay | Admitting: Nurse Practitioner

## 2016-04-04 VITALS — BP 148/88 | HR 65 | Ht 64.5 in | Wt 137.0 lb

## 2016-04-04 DIAGNOSIS — I480 Paroxysmal atrial fibrillation: Secondary | ICD-10-CM | POA: Diagnosis not present

## 2016-04-04 DIAGNOSIS — I10 Essential (primary) hypertension: Secondary | ICD-10-CM

## 2016-04-04 DIAGNOSIS — Z79899 Other long term (current) drug therapy: Secondary | ICD-10-CM | POA: Diagnosis not present

## 2016-04-04 DIAGNOSIS — E785 Hyperlipidemia, unspecified: Secondary | ICD-10-CM | POA: Diagnosis not present

## 2016-04-04 MED ORDER — FLECAINIDE ACETATE 50 MG PO TABS: 50.0000 mg | ORAL_TABLET | Freq: Two times a day (BID) | ORAL | 3 refills | Status: DC

## 2016-04-04 MED ORDER — LISINOPRIL 20 MG PO TABS: 20.0000 mg | ORAL_TABLET | Freq: Every day | ORAL | 3 refills | Status: DC

## 2016-04-04 NOTE — Progress Notes (Signed)
Office Visit    Patient Name: Donna Simmons Date of Encounter: 04/04/2016  Primary Care Provider:  Dow Adolph, MD Primary Cardiologist:  Ranae Palms, MD   Chief Complaint    75 year old female with a prior history of paroxysmal atrial fibrillation who presents for routine follow-up.  Past Medical History    Past Medical History:  Diagnosis Date  . Allergy   . GERD (gastroesophageal reflux disease)   . Heart murmur    No significant valvular lesion noted on echo.  . Hypercholesteremia   . Hypertension   . Palpitations 01/10/2009   14 day monitor- some sinus tachycardia, PVCs  . Paroxysmal atrial fibrillation (HCC) 12/27/2008   Past Surgical History:  Procedure Laterality Date  . ABDOMINAL HYSTERECTOMY  1980  . BREAST SURGERY    . CARDIAC CATHETERIZATION  12/16/2010   no evidence of CAD to explain anginal pain w/ positive troponin.  potential etiology is breakthrough AF  . FRACTURE SURGERY    . NM MYOVIEW LTD  April 2010   EF 64%, normal pattern of perfusion in all regions, no scintigraphic evidence of inducible ischemia; no significant wall motion abnormalities; EKG negative for ischemia; no significant change from last study; low risk scan  . TRANSTHORACIC ECHOCARDIOGRAM  April 2010   Echo - EF >55; RV mils/mod dilated, RV systolic function mildly reduced; RA mild/moderately dilated; moderate pulmonary hypertension; mild tricuspid regurgitation    Allergies  Allergies  Allergen Reactions  . Antihistamines, Chlorpheniramine-Type Palpitations    Makes heart beat fast    History of Present Illness    75 year old female with prior history of paroxysmal atrial fibrillation, hypertension, and frequent PACs/PVCs. She previously underwent diagnostic catheterization in 2012 in the setting of chest pain and A. fib. Catheterization revealed normal coronary arteries. She has since been managed with flecainide and was present to coagulate with Coumadin but was recently  switched over to eliquis in early 2017. She has done well since her office visit in February 2017. She has only very brief and infrequent episodes of palpitations lasting less than 1 minute, and resolving spontaneously. She has not had to take any when necessary metoprolol or flecainide. 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  Home Medications    Prior to Admission medications   Medication Sig Start Date End Date Taking? Authorizing Provider  apixaban (ELIQUIS) 5 MG TABS tablet Take 1 tablet (5 mg total) by mouth 2 (two) times daily. 01/14/16  Yes Marykay Lex, MD  atorvastatin (LIPITOR) 10 MG tablet Take 1 tablet (10 mg total) by mouth daily with lunch. 02/27/15  Yes Marykay Lex, MD  conjugated estrogens (PREMARIN) vaginal cream Place 0.5 Applicatorfuls vaginally 2 (two) times a week. 12/20/15  Yes Linna Hoff, MD  flecainide (TAMBOCOR) 50 MG tablet Take 1 tablet (50 mg total) by mouth 2 (two) times daily. 04/04/16  Yes Ok Anis, NP  fluticasone (FLONASE) 50 MCG/ACT nasal spray Place 2 sprays into both nostrils daily. 12/07/15  Yes Ofilia Neas, PA-C  lisinopril (PRINIVIL,ZESTRIL) 20 MG tablet Take 1 tablet (20 mg total) by mouth daily. 04/04/16  Yes Ok Anis, NP  metoprolol succinate (TOPROL-XL) 25 MG 24 hr tablet Take 1 tablet (25 mg) daily before dinner, may take an additional 1/2 tablet (12.5 mg) 6 hours after as needed. 10/11/15  Yes Marykay Lex, MD  Multiple Vitamin (MULTIVITAMIN WITH MINERALS) TABS tablet  Take 1 tablet by mouth daily with lunch. Centrum Silver   Yes Historical Provider, MD  omeprazole (PRILOSEC) 20 MG capsule Take 1 capsule (20 mg total) by mouth daily. Take BID for 14 days every 4 months 09/22/15  Yes Raelyn Ensign, PA    Review of Systems    As above, she has been doing quite well since her last visit in February.  She has rare, brief palpitations.   She denies chest pain, dyspnea, pnd, orthopnea, n, v, dizziness, syncope, edema, weight gain, or early satiety.  All other systems reviewed and are otherwise negative except as noted above.  Physical Exam    VS:  BP (!) 148/88   Pulse 65   Ht 5' 4.5" (1.638 m)   Wt 137 lb (62.1 kg)   BMI 23.15 kg/m  , BMI Body mass index is 23.15 kg/m. GEN: Well nourished, well developed, in no acute distress.  HEENT: normal.  Neck: Supple, no JVD, carotid bruits, or masses. Cardiac: RRR, no murmurs, rubs, or gallops. No clubbing, cyanosis, edema.  Radials/DP/PT 2+ and equal bilaterally.  Respiratory:  Respirations regular and unlabored, clear to auscultation bilaterally. GI: Soft, nontender, nondistended, BS + x 4. MS: no deformity or atrophy. Skin: warm and dry, no rash. Neuro:  Strength and sensation are intact. Psych: Normal affect.  Accessory Clinical Findings    ECG - Reveals sinus rhythm, 65, no acute ST or T changes.  Assessment & Plan    1.  Paroxysmal atrial fibrillation: Patient has been doing well and continues to tolerate flecainide and eliquis. She is also metoprolol therapy. No changes required today.  2. Essential hypertension: Blood pressure is 148/88 today. We discussed this and she says that pressures typically run in the mid 140s over mid 80s at home. In that setting, I will increase her lisinopril to 20 mg daily. We will follow-up a basic metabolic panel next week.  3. Hyperlipidemia: This followed by primary care. Her last LDL in December 2016, was 161. She is on low-dose atorvastatin therapy.  4. Disposition: Follow-up basic metabolic panel in one week in the setting of increasing ACE inhibitor dose. Follow-up with Dr. Herbie Baltimore in 6 months.   Nicolasa Ducking, NP 04/04/2016, 10:38 AM

## 2016-04-04 NOTE — Patient Instructions (Signed)
Medication Instructions: Ward Givens, NP, has recommended making the following medication changes: 1. INCREASE Lisinopril to 20 mg daily  Labwork: Your physician recommends that you return for lab work in 1 week.  Testing/Procedures: NONE ORDERED  Follow-up: Thayer Ohm recommends that you schedule a follow-up appointment in 6 months with Dr Herbie Baltimore. You will receive a reminder letter in the mail two months in advance. If you don't receive a letter, please call our office to schedule the follow-up appointment.  If you need a refill on your cardiac medications before your next appointment, please call your pharmacy.

## 2016-04-06 ENCOUNTER — Other Ambulatory Visit: Payer: Self-pay | Admitting: Family Medicine

## 2016-04-06 ENCOUNTER — Telehealth: Payer: Self-pay | Admitting: Family Medicine

## 2016-04-06 DIAGNOSIS — D72819 Decreased white blood cell count, unspecified: Secondary | ICD-10-CM

## 2016-04-06 NOTE — Telephone Encounter (Signed)
Called to go over recent labs. Persistent mild leukopenia which appeared about 3 years ago.  Suspect this is benign, but do not have explanation. Will refer to hematology for eval- advised pt I suspect they will release her with a dx of benign finding but want to be sure   Results for orders placed or performed in visit on 03/26/16  CBC  Result Value Ref Range   WBC 3.2 (L) 3.8 - 10.8 K/uL   RBC 4.73 3.80 - 5.10 MIL/uL   Hemoglobin 11.9 11.7 - 15.5 g/dL   HCT 19.138.6 47.835.0 - 29.545.0 %   MCV 81.6 80.0 - 100.0 fL   MCH 25.2 (L) 27.0 - 33.0 pg   MCHC 30.8 (L) 32.0 - 36.0 g/dL   RDW 62.116.2 (H) 30.811.0 - 65.715.0 %   Platelets 245 140 - 400 K/uL   MPV 10.7 7.5 - 12.5 fL  Pathologist smear review  Result Value Ref Range   Path Review SEE NOTE

## 2016-04-08 ENCOUNTER — Ambulatory Visit: Payer: Medicare Other | Admitting: Nurse Practitioner

## 2016-04-11 ENCOUNTER — Other Ambulatory Visit: Payer: Self-pay | Admitting: Physician Assistant

## 2016-04-18 ENCOUNTER — Ambulatory Visit (INDEPENDENT_AMBULATORY_CARE_PROVIDER_SITE_OTHER): Payer: Medicare Other | Admitting: Physician Assistant

## 2016-04-18 ENCOUNTER — Encounter: Payer: Self-pay | Admitting: Physician Assistant

## 2016-04-18 ENCOUNTER — Telehealth: Payer: Self-pay

## 2016-04-18 VITALS — BP 124/76 | HR 76 | Temp 98.4°F | Resp 18 | Ht 64.5 in | Wt 131.0 lb

## 2016-04-18 DIAGNOSIS — Z0000 Encounter for general adult medical examination without abnormal findings: Secondary | ICD-10-CM

## 2016-04-18 DIAGNOSIS — Z01818 Encounter for other preprocedural examination: Secondary | ICD-10-CM

## 2016-04-18 NOTE — Patient Instructions (Signed)
     IF you received an x-ray today, you will receive an invoice from [facility]. Please contact [facility] at [phone] with questions or concerns regarding your invoice.   IF you received labwork today, you will receive an invoice from [facility]. Please contact [facility] at [phone] with questions or concerns regarding your invoice.   Our billing staff will not be able to assist you with questions regarding bills from these companies.  You will be contacted with the lab results as soon as they are available. The fastest way to get your results is to activate your My Chart account. Instructions are located on the last page of this paperwork. If you have not heard from us regarding the results in 2 weeks, please contact this office.      

## 2016-04-18 NOTE — Telephone Encounter (Signed)
Called patient per Deliah BostonMichael Clark. Wanted to tell her that Casimiro NeedleMichael spoke with Dr. Lavona MoundGrout and he agreed to not change any of her medications prior to her procedure. Wasn't able to leave a message on phone for patient.

## 2016-04-18 NOTE — Progress Notes (Signed)
04/18/2016 2:13 PM   DOB: 30-May-1941 / MRN: [id number]  SUBJECTIVE:  Donna Simmons is a 75 y.o. female presenting for surgical clearance for cataract surgery.  She reports to me that there will be no general anesthesia associated with this. She has a form she would like completed. She has a history of atrial fib and has been controlled with flecanide and metoprolol and she takes Eliquis to prevent embolism.  She is taking 20 mg of lisinopril for HTN.     Current Outpatient Prescriptions:  .  apixaban (ELIQUIS) 5 MG TABS tablet, Take 1 tablet (5 mg total) by mouth 2 (two) times daily., Disp: 60 tablet, Rfl: 5 .  atorvastatin (LIPITOR) 10 MG tablet, Take 1 tablet (10 mg total) by mouth daily with lunch., Disp: 90 tablet, Rfl: 3 .  conjugated estrogens (PREMARIN) vaginal cream, Place 0.5 Applicatorfuls vaginally 2 (two) times a week., Disp: 42.5 g, Rfl: 1 .  flecainide (TAMBOCOR) 50 MG tablet, Take 1 tablet (50 mg total) by mouth 2 (two) times daily., Disp: 180 tablet, Rfl: 3 .  fluticasone (FLONASE) 50 MCG/ACT nasal spray, Place 2 sprays into both nostrils daily., Disp: 16 g, Rfl: 11 .  lisinopril (PRINIVIL,ZESTRIL) 20 MG tablet, Take 1 tablet (20 mg total) by mouth daily., Disp: 90 tablet, Rfl: 3 .  metoprolol succinate (TOPROL-XL) 25 MG 24 hr tablet, Take 1 tablet (25 mg) daily before dinner, may take an additional 1/2 tablet (12.5 mg) 6 hours after as needed., Disp: 90 tablet, Rfl: 3 .  Multiple Vitamin (MULTIVITAMIN WITH MINERALS) TABS tablet, Take 1 tablet by mouth daily with lunch. Centrum Silver, Disp: , Rfl:  .  omeprazole (PRILOSEC) 20 MG capsule, Take 1 capsule (20 mg total) by mouth daily. Take BID for 14 days every 4 months, Disp: 30 capsule, Rfl: 1   She is allergic to antihistamines, chlorpheniramine-type.   She  has a past medical history of Allergy; GERD (gastroesophageal reflux disease); Heart murmur; Hypercholesteremia; Hypertension; Palpitations (01/10/2009); and Paroxysmal  atrial fibrillation (HCC) (12/27/2008).    She  reports that she has never smoked. She has never used smokeless tobacco. She reports that she does not drink alcohol or use drugs. She  reports that she currently engages in sexual activity. The patient  has a past surgical history that includes Breast surgery; Fracture surgery; Abdominal hysterectomy (1980); Cardiac catheterization (12/16/2010); transthoracic echocardiogram (April 2010); and NM MYOVIEW LTD (April 2010).  Her family history includes Arthritis in her mother; Cancer in her maternal grandmother; Cancer - Lung in her brother and brother; Cancer - Prostate in her brother; Dementia in her mother and sister; Heart disease in her father and maternal grandfather; Hypertension in her brother, mother, and sister.  Review of Systems  Constitutional: Negative for chills and fever.  Respiratory: Negative for cough and wheezing.   Cardiovascular: Negative for chest pain and leg swelling.  Gastrointestinal: Negative for nausea.  Skin: Negative for itching and rash.  Neurological: Negative for headaches.    The problem list and medications were reviewed and updated by myself where necessary and exist elsewhere in the encounter.   OBJECTIVE:  BP 124/76 (BP Location: Right Arm, Patient Position: Sitting, Cuff Size: Small)   Pulse 76   Temp 98.4 F (36.9 C) (Oral)   Resp 18   Ht 5' 4.5" (1.638 m)   Wt 131 lb (59.4 kg)   SpO2 98%   BMI 22.14 kg/m   Physical Exam  Constitutional: She is oriented to person,  place, and time. She appears well-developed and well-nourished. No distress.  Cardiovascular: Normal rate, regular rhythm and normal heart sounds.   Pulmonary/Chest: Effort normal and breath sounds normal.  Abdominal: Soft. Bowel sounds are normal.  Musculoskeletal: Normal range of motion.  Neurological: She is alert and oriented to person, place, and time. She displays normal reflexes. No cranial nerve deficit. She exhibits normal muscle  tone. Coordination normal.  Skin: Skin is warm and dry. She is not diaphoretic.  Psychiatric: She has a normal mood and affect.   No results found for this or any previous visit (from the past 72 hour(s)).  No results found.  ASSESSMENT AND PLAN  Donna Simmons was seen today for medical clearance.  Diagnoses and all orders for this visit:  Routine general medical examination at a health care facility Comments: She is cleared.  I have run her case by Dr. Dione Simmons who tells me that there are really no restrictions to cataract surgery.  He advised that we do not change any of her meds for this.     The patient is advised to call or return to clinic if she does not see an improvement in symptoms, or to seek the care of the closest emergency department if she worsens with the above plan.   Donna Simmons, MHS, PA-C [facility] 04/18/2016 2:13 PM

## 2016-05-16 ENCOUNTER — Ambulatory Visit: Payer: Medicare Other

## 2016-05-16 ENCOUNTER — Ambulatory Visit: Payer: Medicare Other | Admitting: Hematology & Oncology

## 2016-05-16 ENCOUNTER — Other Ambulatory Visit: Payer: Medicare Other

## 2016-05-31 ENCOUNTER — Other Ambulatory Visit: Payer: Self-pay | Admitting: Cardiology

## 2016-06-02 NOTE — Telephone Encounter (Signed)
Rx request sent to pharmacy.  

## 2016-06-03 ENCOUNTER — Ambulatory Visit (INDEPENDENT_AMBULATORY_CARE_PROVIDER_SITE_OTHER): Payer: Medicare Other | Admitting: Family Medicine

## 2016-06-03 VITALS — BP 122/72 | HR 90 | Temp 98.3°F | Resp 17 | Ht 64.5 in | Wt 131.0 lb

## 2016-06-03 DIAGNOSIS — I10 Essential (primary) hypertension: Secondary | ICD-10-CM

## 2016-06-03 DIAGNOSIS — R102 Pelvic and perineal pain: Secondary | ICD-10-CM

## 2016-06-03 DIAGNOSIS — K219 Gastro-esophageal reflux disease without esophagitis: Secondary | ICD-10-CM | POA: Diagnosis not present

## 2016-06-03 DIAGNOSIS — R0789 Other chest pain: Secondary | ICD-10-CM

## 2016-06-03 DIAGNOSIS — R3121 Asymptomatic microscopic hematuria: Secondary | ICD-10-CM

## 2016-06-03 DIAGNOSIS — R350 Frequency of micturition: Secondary | ICD-10-CM | POA: Diagnosis not present

## 2016-06-03 LAB — POCT URINALYSIS DIP (MANUAL ENTRY)
Bilirubin, UA: NEGATIVE
Glucose, UA: NEGATIVE
Ketones, POC UA: NEGATIVE
Leukocytes, UA: NEGATIVE
NITRITE UA: NEGATIVE
PH UA: 7
Protein Ur, POC: NEGATIVE
Spec Grav, UA: 1.01
UROBILINOGEN UA: 0.2

## 2016-06-03 LAB — POC MICROSCOPIC URINALYSIS (UMFC): Mucus: ABSENT

## 2016-06-03 MED ORDER — SUCRALFATE 1 GM/10ML PO SUSP: 1.0000 g | Freq: Three times a day (TID) | ORAL | 0 refills | Status: DC

## 2016-06-03 NOTE — Progress Notes (Deleted)
   Patient ID: Donna MaserMary P Quezada, female    DOB: March 26, 1941, 75 y.o.   MRN: [id number]  PCP: Dow AdolphMCPHERSON,BARBARA, MD  No chief complaint on file.   Subjective:   HPI Presents for evaluation of   ***.    Review of Systems     Patient Active Problem List   Diagnosis Date Noted  . Dysphagia 11/05/2015  . Long term current use of anticoagulant therapy 11/18/2012  . Hypercholesteremia 09/25/2012  . DDD (degenerative disc disease), cervical 05/19/2012  . Paroxysmal atrial fibrillation (HCC) 04/20/2012  . Essential hypertension 04/20/2012  . Palpitations 01/10/2009     Prior to Admission medications   Medication Sig Start Date End Date Taking? Authorizing Provider  apixaban (ELIQUIS) 5 MG TABS tablet Take 1 tablet (5 mg total) by mouth 2 (two) times daily. 01/14/16   Marykay Lexavid W Harding, MD  atorvastatin (LIPITOR) 10 MG tablet TAKE ONE TABLET BY MOUTH ONCE DAILY WITH  LUNCH 06/02/16   Marykay Lexavid W Harding, MD  conjugated estrogens (PREMARIN) vaginal cream Place 0.5 Applicatorfuls vaginally 2 (two) times a week. 12/20/15   Linna HoffJames D Kindl, MD  flecainide (TAMBOCOR) 50 MG tablet Take 1 tablet (50 mg total) by mouth 2 (two) times daily. 04/04/16   Ok Anishristopher R Berge, NP  fluticasone (FLONASE) 50 MCG/ACT nasal spray Place 2 sprays into both nostrils daily. 12/07/15   Ofilia NeasMichael L Clark, PA-C  lisinopril (PRINIVIL,ZESTRIL) 20 MG tablet Take 1 tablet (20 mg total) by mouth daily. 04/04/16   Ok Anishristopher R Berge, NP  metoprolol succinate (TOPROL-XL) 25 MG 24 hr tablet Take 1 tablet (25 mg) daily before dinner, may take an additional 1/2 tablet (12.5 mg) 6 hours after as needed. 10/11/15   Marykay Lexavid W Harding, MD  Multiple Vitamin (MULTIVITAMIN WITH MINERALS) TABS tablet Take 1 tablet by mouth daily with lunch. Centrum Silver    Historical Provider, MD  omeprazole (PRILOSEC) 20 MG capsule Take 1 capsule (20 mg total) by mouth daily. Take BID for 14 days every 4 months 09/22/15   Raelyn Ensignodd McVeigh, PA     Allergies    Allergen Reactions  . Antihistamines, Chlorpheniramine-Type Palpitations    Makes heart beat fast       Objective:  Physical Exam         Assessment & Plan:   ***

## 2016-06-03 NOTE — Progress Notes (Addendum)
Subjective:  By signing my name below, I, Stann Ore, attest that this documentation has been prepared under the direction and in the presence of Meredith Staggers, MD. Electronically Signed: Stann Ore, Scribe. 06/03/2016 , 3:36 PM .  Patient was seen in Room 9 .   Patient ID: Donna Simmons, female    DOB: October 16, 1940, 75 y.o.   MRN: [id number] Chief Complaint  Patient presents with  . Hypertension   HPI Donna Simmons is a 75 y.o. female Here for HTN follow up. She has history of HTN, and paroxysmal afib.   Patient states she has history of GERD. When her GERD flares up, her BP tends to become elevated. She checks her BP about once a week, running about 165/95 to 177/94 this morning. She's been checking her BP with the same machine at home. Usually, her BP would run 130s~140s/70~85. She notes she's been having a constant burning sensation in her lower abdomen up to her esophagus right beneath her breast bone. She denies any radiation of the pain. She denies fever, chest pain, weakness, nausea, vomiting, slurred speech, facial droop, or chest tightness. She denies history of heart attack. She took lisinopril 1.5 pill this morning; usually takes just 1.   She's been belching a lot with this abdominal burning. She's been instructed to take omeprazole for 2 weeks, every 4 months. When she's having an episode of GERD, she takes omeprazole qd for 30 days. She started back on it 2 days ago when she had the problem and elevated BP. There is some soreness when she pushes over the area. Her GI doctor is Dr. Elnoria Howard. Her cardiologist is Dr. Herbie Baltimore.   She also notes lower abdominal tenderness, which is new. She has had urinary frequency ever since her hysterectomy. She denies any changes with her frequency though. She denies dysuria.   Patient Active Problem List   Diagnosis Date Noted  . Dysphagia 11/05/2015  . Long term current use of anticoagulant therapy 11/18/2012  . Hypercholesteremia  09/25/2012  . DDD (degenerative disc disease), cervical 05/19/2012  . Paroxysmal atrial fibrillation (HCC) 04/20/2012  . Essential hypertension 04/20/2012  . Palpitations 01/10/2009   Past Medical History:  Diagnosis Date  . Allergy   . GERD (gastroesophageal reflux disease)   . Heart murmur    No significant valvular lesion noted on echo.  . Hypercholesteremia   . Hypertension   . Palpitations 01/10/2009   14 day monitor- some sinus tachycardia, PVCs  . Paroxysmal atrial fibrillation (HCC) 12/27/2008   Past Surgical History:  Procedure Laterality Date  . ABDOMINAL HYSTERECTOMY  1980  . BREAST SURGERY    . CARDIAC CATHETERIZATION  12/16/2010   no evidence of CAD to explain anginal pain w/ positive troponin.  potential etiology is breakthrough AF  . FRACTURE SURGERY    . NM MYOVIEW LTD  April 2010   EF 64%, normal pattern of perfusion in all regions, no scintigraphic evidence of inducible ischemia; no significant wall motion abnormalities; EKG negative for ischemia; no significant change from last study; low risk scan  . TRANSTHORACIC ECHOCARDIOGRAM  April 2010   Echo - EF >55; RV mils/mod dilated, RV systolic function mildly reduced; RA mild/moderately dilated; moderate pulmonary hypertension; mild tricuspid regurgitation   Allergies  Allergen Reactions  . Antihistamines, Chlorpheniramine-Type Palpitations    Makes heart beat fast   Prior to Admission medications   Medication Sig Start Date End Date Taking? Authorizing Provider  apixaban (ELIQUIS) 5 MG TABS tablet Take  1 tablet (5 mg total) by mouth 2 (two) times daily. 01/14/16  Yes Marykay Lex, MD  atorvastatin (LIPITOR) 10 MG tablet TAKE ONE TABLET BY MOUTH ONCE DAILY WITH  LUNCH 06/02/16  Yes Marykay Lex, MD  conjugated estrogens (PREMARIN) vaginal cream Place 0.5 Applicatorfuls vaginally 2 (two) times a week. 12/20/15  Yes Linna Hoff, MD  flecainide (TAMBOCOR) 50 MG tablet Take 1 tablet (50 mg total) by mouth 2 (two)  times daily. 04/04/16  Yes Ok Anis, NP  fluticasone (FLONASE) 50 MCG/ACT nasal spray Place 2 sprays into both nostrils daily. 12/07/15  Yes Ofilia Neas, PA-C  lisinopril (PRINIVIL,ZESTRIL) 20 MG tablet Take 1 tablet (20 mg total) by mouth daily. 04/04/16  Yes Ok Anis, NP  metoprolol succinate (TOPROL-XL) 25 MG 24 hr tablet Take 1 tablet (25 mg) daily before dinner, may take an additional 1/2 tablet (12.5 mg) 6 hours after as needed. 10/11/15  Yes Marykay Lex, MD  Multiple Vitamin (MULTIVITAMIN WITH MINERALS) TABS tablet Take 1 tablet by mouth daily with lunch. Centrum Silver   Yes Historical Provider, MD  omeprazole (PRILOSEC) 20 MG capsule Take 1 capsule (20 mg total) by mouth daily. Take BID for 14 days every 4 months 09/22/15  Yes Raelyn Ensign, PA   Social History   Social History  . Marital status: Married    Spouse name: N/A  . Number of children: N/A  . Years of education: N/A   Occupational History  . Not on file.   Social History Main Topics  . Smoking status: Never Smoker  . Smokeless tobacco: Never Used  . Alcohol use No  . Drug use: No  . Sexual activity: Yes   Other Topics Concern  . Not on file   Social History Narrative   Married with no children. Walks several times a week at least 3-4 times a week about 2 miles a time. Does not smoke and does not drink.   Review of Systems  Constitutional: Negative for fatigue and unexpected weight change.  Respiratory: Negative for chest tightness and shortness of breath.   Cardiovascular: Negative for chest pain, palpitations and leg swelling.  Gastrointestinal: Negative for abdominal pain and blood in stool.  Genitourinary: Positive for frequency. Negative for dysuria.  Neurological: Negative for dizziness, syncope, light-headedness and headaches.       Objective:   Physical Exam  Constitutional: She is oriented to person, place, and time. She appears well-developed and well-nourished.  HENT:    Head: Normocephalic and atraumatic.  Eyes: Conjunctivae and EOM are normal. Pupils are equal, round, and reactive to light.  Neck: Carotid bruit is not present.  Cardiovascular: Normal rate, regular rhythm, normal heart sounds and intact distal pulses.   Pulmonary/Chest: Effort normal and breath sounds normal.  Reproducible pain over lower sternum  Abdominal: Soft. She exhibits no pulsatile midline mass. There is tenderness in the suprapubic area. There is no rebound and no guarding.  Suprapubic tenderness on the right  Neurological: She is alert and oriented to person, place, and time.  Skin: Skin is warm and dry.  Psychiatric: She has a normal mood and affect. Her behavior is normal.  Vitals reviewed.   Vitals:   06/03/16 1436  BP: 122/72  Pulse: 90  Resp: 17  Temp: 98.3 F (36.8 C)  TempSrc: Oral  SpO2: 99%  Weight: 131 lb (59.4 kg)  Height: 5' 4.5" (1.638 m)   Results for orders placed or performed in visit  on 06/03/16  POCT urinalysis dipstick  Result Value Ref Range   Color, UA yellow yellow   Clarity, UA clear clear   Glucose, UA negative negative   Bilirubin, UA negative negative   Ketones, POC UA negative negative   Spec Grav, UA 1.010    Blood, UA moderate (A) negative   pH, UA 7.0    Protein Ur, POC negative negative   Urobilinogen, UA 0.2    Nitrite, UA Negative Negative   Leukocytes, UA Negative Negative  POCT Microscopic Urinalysis ([facility])  Result Value Ref Range   WBC,UR,HPF,POC None None WBC/hpf   RBC,UR,HPF,POC Few (A) None RBC/hpf   Bacteria Few (A) None, Too numerous to count   Mucus Absent Absent   Epithelial Cells, UR Per Microscopy None None, Too numerous to count cells/hpf       Assessment & Plan:      Donna Simmons is a 75 y.o. female Gastroesophageal reflux disease, esophagitis presence not specified - Plan: sucralfate (CARAFATE) 1 GM/10ML suspension  - Suspected cause of chest symptoms. Just restarted PPI, may improve in the next  day or 2, if not start Carafate until symptoms improve, then if persistent symptoms next week, return for recheck.  Chest wall pain  - Tylenol, symptomatic care,, RTC/ER precautions.  Essential hypertension  - Elevated reading at home, but normal in office. Less likely due to her additional half dose of ACE inhibitor as may be too soon to see that effect, but advised to check home readings and if remains elevated, return to office with her monitor to verify accuracy. RTC/ER precautions if symptomatic.  Suprapubic abdominal pain Urinary frequency - Plan: POCT urinalysis dipstick, POCT Microscopic Urinalysis ([facility]) Asymptomatic microscopic hematuria  -Long-standing urinary frequency, no sign of infection on urinalysis. Asymptomatic microscopic hematuria, but she does wear pads and noticed some raw or bleeding area previously. Return for recheck U/A in the next 2 weeks, or sooner if any new urinary symptoms or if persistent abdominal pain.    Meds ordered this encounter  Medications  . sucralfate (CARAFATE) 1 GM/10ML suspension    Sig: Take 10 mLs (1 g total) by mouth 4 (four) times daily -  with meals and at bedtime.    Dispense:  420 mL    Refill:  0   Patient Instructions    For your heartburn, continue the omeprazole, see information on diet and foods to avoid below. If your symptoms are persisting into tomorrow, you can try the Carafate suspension that was discussed today, then as symptoms improve later this week, return to just omeprazole. If not improving by the end of this week, return for recheck. Tylenol for chest wall pain as needed. Return to the clinic or go to the nearest emergency room if any of your symptoms worsen or new symptoms occur.  Your blood pressure readings at home may not be accurate as your reading here looks okay. Return to same dose of blood pressure medication, check your blood pressure twice per day, using techniques discussed below, and if level over 150/90 in  the next 2 days, return for recheck with your home machine to make sure that is accurate. Return to the clinic or go to the nearest emergency room if any of your symptoms worsen or new symptoms occur.  If abdominal pain not improving in the next few days, return for recheck. Sooner if fevers, nausea, vomiting, or worsening. Your urine test did indicate some possible blood, but no sign of infection. Repeat  that test in the next few weeks, but if any burning with urination, or other changes sooner - return for recheck.   Food Choices for Gastroesophageal Reflux Disease, Adult When you have gastroesophageal reflux disease (GERD), the foods you eat and your eating habits are very important. Choosing the right foods can help ease the discomfort of GERD. WHAT GENERAL GUIDELINES DO I NEED TO FOLLOW?  Choose fruits, vegetables, whole grains, low-fat dairy products, and low-fat meat, fish, and poultry.  Limit fats such as oils, salad dressings, butter, nuts, and avocado.  Keep a food diary to identify foods that cause symptoms.  Avoid foods that cause reflux. These may be different for different people.  Eat frequent small meals instead of three large meals each day.  Eat your meals slowly, in a relaxed setting.  Limit fried foods.  Cook foods using methods other than frying.  Avoid drinking alcohol.  Avoid drinking large amounts of liquids with your meals.  Avoid bending over or lying down until 2-3 hours after eating. WHAT FOODS ARE NOT RECOMMENDED? The following are some foods and drinks that may worsen your symptoms: Vegetables Tomatoes. Tomato juice. Tomato and spaghetti sauce. Chili peppers. Onion and garlic. Horseradish. Fruits Oranges, grapefruit, and lemon (fruit and juice). Meats High-fat meats, fish, and poultry. This includes hot dogs, ribs, ham, sausage, salami, and bacon. Dairy Whole milk and chocolate milk. Sour cream. Cream. Butter. Ice cream. Cream cheese.   Beverages Coffee and tea, with or without caffeine. Carbonated beverages or energy drinks. Condiments Hot sauce. Barbecue sauce.  Sweets/Desserts Chocolate and cocoa. Donuts. Peppermint and spearmint. Fats and Oils High-fat foods, including [place] fries and potato chips. Other Vinegar. Strong spices, such as black pepper, white pepper, red pepper, cayenne, curry powder, cloves, ginger, and chili powder. The items listed above may not be a complete list of foods and beverages to avoid. Contact your dietitian for more information.   This information is not intended to replace advice given to you by your health care provider. Make sure you discuss any questions you have with your health care provider.   Document Released: 08/18/2005 Document Revised: 09/08/2014 Document Reviewed: 06/22/2013 Elsevier Interactive Patient Education 2016 [employer].    How to Take Your Blood Pressure HOW DO I GET A BLOOD PRESSURE MACHINE?  You can buy an electronic home blood pressure machine at your local pharmacy. Insurance will sometimes cover the cost if you have a prescription.  Ask your doctor what type of machine is best for you. There are different machines for your arm and your wrist.  If you decide to buy a machine to check your blood pressure on your arm, first check the size of your arm so you can buy the right size cuff. To check the size of your arm:   Use a measuring tape that shows both inches and centimeters.   Wrap the measuring tape around the upper-middle part of your arm. You may need someone to help you measure.   Write down your arm measurement in both inches and centimeters.   To measure your blood pressure correctly, it is important to have the right size cuff.   If your arm is up to 13 inches (up to 34 centimeters), get an adult cuff size.  If your arm is 13 to 17 inches (35 to 44 centimeters), get a large adult cuff size.    If your arm is 17 to 20 inches (45 to  52 centimeters), get an adult thigh cuff.  WHAT DO THE NUMBERS MEAN?   There are two numbers that make up your blood pressure. For example: 120/80.  The first number (120 in our example) is called the "systolic pressure." It is a measure of the pressure in your blood vessels when your heart is pumping blood.  The second number (80 in our example) is called the "diastolic pressure." It is a measure of the pressure in your blood vessels when your heart is resting between beats.  Your doctor will tell you what your blood pressure should be. WHAT SHOULD I DO BEFORE I CHECK MY BLOOD PRESSURE?   Try to rest or relax for at least 30 minutes before you check your blood pressure.  Do not smoke.  Do not have any drinks with caffeine, such as:  Soda.  Coffee.  Tea.  Check your blood pressure in a quiet room.  Sit down and stretch out your arm on a table. Keep your arm at about the level of your heart. Let your arm relax.  Make sure that your legs are not crossed. HOW DO I CHECK MY BLOOD PRESSURE?  Follow the directions that came with your machine.  Make sure you remove any tight-fitting clothing from your arm or wrist. Wrap the cuff around your upper arm or wrist. You should be able to fit a finger between the cuff and your arm. If you cannot fit a finger between the cuff and your arm, it is too tight and should be removed and rewrapped.  Some units require you to manually pump up the arm cuff.  Automatic units inflate the cuff when you press a button.  Cuff deflation is automatic in both models.  After the cuff is inflated, the unit measures your blood pressure and pulse. The readings are shown on a monitor. Hold still and breathe normally while the cuff is inflated.  Getting a reading takes less than a minute.  Some models store readings in a memory. Some provide a printout of readings. If your machine does not store your readings, keep a written record.  Take readings with you  to your next visit with your doctor.   This information is not intended to replace advice given to you by your health care provider. Make sure you discuss any questions you have with your health care provider.   Document Released: 07/31/2008 Document Revised: 09/08/2014 Document Reviewed: 10/13/2013 Elsevier Interactive Patient Education 2016 [employer] Inc[employer]   Chest Wall Pain Chest wall pain is pain in or around the bones and muscles of your chest. Sometimes, an injury causes this pain. Sometimes, the cause may not be known. This pain may take several weeks or longer to get better. HOME CARE INSTRUCTIONS  Pay attention to any changes in your symptoms. Take these actions to help with your pain:   Rest as told by your health care provider.   Avoid activities that cause pain. These include any activities that use your chest muscles or your abdominal and side muscles to lift heavy items.   If directed, apply ice to the painful area:  Put ice in a plastic bag.  Place a towel between your skin and the bag.  Leave the ice on for 20 minutes, 2-3 times per day.  Take over-the-counter and prescription medicines only as told by your health care provider.  Do not use tobacco products, including cigarettes, chewing tobacco, and e-cigarettes. If you need help quitting, ask your health care provider.  Keep all follow-up visits as told by  your health care provider. This is important. SEEK MEDICAL CARE IF:  You have a fever.  Your chest pain becomes worse.  You have new symptoms. SEEK IMMEDIATE MEDICAL CARE IF:  You have nausea or vomiting.  You feel sweaty or light-headed.  You have a cough with phlegm (sputum) or you cough up blood.  You develop shortness of breath.   This information is not intended to replace advice given to you by your health care provider. Make sure you discuss any questions you have with your health care provider.   Document Released: 08/18/2005 Document  Revised: 05/09/2015 Document Reviewed: 11/13/2014 Elsevier Interactive Patient Education 2016 [employer].   IF you received an x-ray today, you will receive an invoice from [facility]. Please contact [facility] at [phone] with questions or concerns regarding your invoice.   IF you received labwork today, you will receive an invoice from [employer]. Please contact [facility] at [phone] with questions or concerns regarding your invoice.   Our billing staff will not be able to assist you with questions regarding bills from these companies.  You will be contacted with the lab results as soon as they are available. The fastest way to get your results is to activate your My Chart account. Instructions are located on the last page of this paperwork. If you have not heard from [place] regarding the results in 2 weeks, please contact this office.        I personally performed the services described in this documentation, which was scribed in my presence. The recorded information has been reviewed and considered, and addended by me as needed.   Signed,   Meredith Staggers, MD [facility].  06/04/16 1:03 PM

## 2016-06-03 NOTE — Patient Instructions (Addendum)
For your heartburn, continue the omeprazole, see information on diet and foods to avoid below. If your symptoms are persisting into tomorrow, you can try the Carafate suspension that was discussed today, then as symptoms improve later this week, return to just omeprazole. If not improving by the end of this week, return for recheck. Tylenol for chest wall pain as needed. Return to the clinic or go to the nearest emergency room if any of your symptoms worsen or new symptoms occur.  Your blood pressure readings at home may not be accurate as your reading here looks okay. Return to same dose of blood pressure medication, check your blood pressure twice per day, using techniques discussed below, and if level over 150/90 in the next 2 days, return for recheck with your home machine to make sure that is accurate. Return to the clinic or go to the nearest emergency room if any of your symptoms worsen or new symptoms occur.  If abdominal pain not improving in the next few days, return for recheck. Sooner if fevers, nausea, vomiting, or worsening. Your urine test did indicate some possible blood, but no sign of infection. Repeat that test in the next few weeks, but if any burning with urination, or other changes sooner - return for recheck.   Food Choices for Gastroesophageal Reflux Disease, Adult When you have gastroesophageal reflux disease (GERD), the foods you eat and your eating habits are very important. Choosing the right foods can help ease the discomfort of GERD. WHAT GENERAL GUIDELINES DO I NEED TO FOLLOW?  Choose fruits, vegetables, whole grains, low-fat dairy products, and low-fat meat, fish, and poultry.  Limit fats such as oils, salad dressings, butter, nuts, and avocado.  Keep a food diary to identify foods that cause symptoms.  Avoid foods that cause reflux. These may be different for different people.  Eat frequent small meals instead of three large meals each day.  Eat your meals  slowly, in a relaxed setting.  Limit fried foods.  Cook foods using methods other than frying.  Avoid drinking alcohol.  Avoid drinking large amounts of liquids with your meals.  Avoid bending over or lying down until 2-3 hours after eating. WHAT FOODS ARE NOT RECOMMENDED? The following are some foods and drinks that may worsen your symptoms: Vegetables Tomatoes. Tomato juice. Tomato and spaghetti sauce. Chili peppers. Onion and garlic. Horseradish. Fruits Oranges, grapefruit, and lemon (fruit and juice). Meats High-fat meats, fish, and poultry. This includes hot dogs, ribs, ham, sausage, salami, and bacon. Dairy Whole milk and chocolate milk. Sour cream. Cream. Butter. Ice cream. Cream cheese.  Beverages Coffee and tea, with or without caffeine. Carbonated beverages or energy drinks. Condiments Hot sauce. Barbecue sauce.  Sweets/Desserts Chocolate and cocoa. Donuts. Peppermint and spearmint. Fats and Oils High-fat foods, including [place] fries and potato chips. Other Vinegar. Strong spices, such as black pepper, white pepper, red pepper, cayenne, curry powder, cloves, ginger, and chili powder. The items listed above may not be a complete list of foods and beverages to avoid. Contact your dietitian for more information.   This information is not intended to replace advice given to you by your health care provider. Make sure you discuss any questions you have with your health care provider.   Document Released: 08/18/2005 Document Revised: 09/08/2014 Document Reviewed: 06/22/2013 Elsevier Interactive Patient Education 2016 [employer]    How to Take Your Blood Pressure HOW DO I GET A BLOOD PRESSURE MACHINE?  You can buy an electronic home blood  pressure machine at your local pharmacy. Insurance will sometimes cover the cost if you have a prescription.  Ask your doctor what type of machine is best for you. There are different machines for your arm and your wrist.  If  you decide to buy a machine to check your blood pressure on your arm, first check the size of your arm so you can buy the right size cuff. To check the size of your arm:   Use a measuring tape that shows both inches and centimeters.   Wrap the measuring tape around the upper-middle part of your arm. You may need someone to help you measure.   Write down your arm measurement in both inches and centimeters.   To measure your blood pressure correctly, it is important to have the right size cuff.   If your arm is up to 13 inches (up to 34 centimeters), get an adult cuff size.  If your arm is 13 to 17 inches (35 to 44 centimeters), get a large adult cuff size.    If your arm is 17 to 20 inches (45 to 52 centimeters), get an adult thigh cuff.  WHAT DO THE NUMBERS MEAN?   There are two numbers that make up your blood pressure. For example: 120/80.  The first number (120 in our example) is called the "systolic pressure." It is a measure of the pressure in your blood vessels when your heart is pumping blood.  The second number (80 in our example) is called the "diastolic pressure." It is a measure of the pressure in your blood vessels when your heart is resting between beats.  Your doctor will tell you what your blood pressure should be. WHAT SHOULD I DO BEFORE I CHECK MY BLOOD PRESSURE?   Try to rest or relax for at least 30 minutes before you check your blood pressure.  Do not smoke.  Do not have any drinks with caffeine, such as:  Soda.  Coffee.  Tea.  Check your blood pressure in a quiet room.  Sit down and stretch out your arm on a table. Keep your arm at about the level of your heart. Let your arm relax.  Make sure that your legs are not crossed. HOW DO I CHECK MY BLOOD PRESSURE?  Follow the directions that came with your machine.  Make sure you remove any tight-fitting clothing from your arm or wrist. Wrap the cuff around your upper arm or wrist. You should be able  to fit a finger between the cuff and your arm. If you cannot fit a finger between the cuff and your arm, it is too tight and should be removed and rewrapped.  Some units require you to manually pump up the arm cuff.  Automatic units inflate the cuff when you press a button.  Cuff deflation is automatic in both models.  After the cuff is inflated, the unit measures your blood pressure and pulse. The readings are shown on a monitor. Hold still and breathe normally while the cuff is inflated.  Getting a reading takes less than a minute.  Some models store readings in a memory. Some provide a printout of readings. If your machine does not store your readings, keep a written record.  Take readings with you to your next visit with your doctor.   This information is not intended to replace advice given to you by your health care provider. Make sure you discuss any questions you have with your health care provider.  Document Released: 07/31/2008 Document Revised: 09/08/2014 Document Reviewed: 10/13/2013 Elsevier Interactive Patient Education 2016 Elsevier Inc.   Chest Wall Pain Chest wall pain is pain in or around the bones and muscles of your chest. Sometimes, an injury causes this pain. Sometimes, the cause may not be known. This pain may take several weeks or longer to get better. HOME CARE INSTRUCTIONS  Pay attention to any changes in your symptoms. Take these actions to help with your pain:   Rest as told by your health care provider.   Avoid activities that cause pain. These include any activities that use your chest muscles or your abdominal and side muscles to lift heavy items.   If directed, apply ice to the painful area:  Put ice in a plastic bag.  Place a towel between your skin and the bag.  Leave the ice on for 20 minutes, 2-3 times per day.  Take over-the-counter and prescription medicines only as told by your health care provider.  Do not use tobacco products,  including cigarettes, chewing tobacco, and e-cigarettes. If you need help quitting, ask your health care provider.  Keep all follow-up visits as told by your health care provider. This is important. SEEK MEDICAL CARE IF:  You have a fever.  Your chest pain becomes worse.  You have new symptoms. SEEK IMMEDIATE MEDICAL CARE IF:  You have nausea or vomiting.  You feel sweaty or light-headed.  You have a cough with phlegm (sputum) or you cough up blood.  You develop shortness of breath.   This information is not intended to replace advice given to you by your health care provider. Make sure you discuss any questions you have with your health care provider.   Document Released: 08/18/2005 Document Revised: 05/09/2015 Document Reviewed: 11/13/2014 Elsevier Interactive Patient Education 2016 [employer].   IF you received an x-ray today, you will receive an invoice from [facility]. Please contact [facility] at [phone] with questions or concerns regarding your invoice.   IF you received labwork today, you will receive an invoice from [employer]. Please contact [facility] at [phone] with questions or concerns regarding your invoice.   Our billing staff will not be able to assist you with questions regarding bills from these companies.  You will be contacted with the lab results as soon as they are available. The fastest way to get your results is to activate your My Chart account. Instructions are located on the last page of this paperwork. If you have not heard from [place] regarding the results in 2 weeks, please contact this office.

## 2016-06-04 ENCOUNTER — Ambulatory Visit: Payer: Medicare Other

## 2016-06-04 ENCOUNTER — Encounter: Payer: Self-pay | Admitting: Hematology & Oncology

## 2016-06-04 ENCOUNTER — Other Ambulatory Visit (HOSPITAL_BASED_OUTPATIENT_CLINIC_OR_DEPARTMENT_OTHER): Payer: Medicare Other

## 2016-06-04 ENCOUNTER — Ambulatory Visit (HOSPITAL_BASED_OUTPATIENT_CLINIC_OR_DEPARTMENT_OTHER): Payer: Medicare Other | Admitting: Hematology & Oncology

## 2016-06-04 VITALS — BP 165/85 | HR 79 | Temp 97.9°F | Resp 18 | Ht 64.5 in | Wt 130.1 lb

## 2016-06-04 DIAGNOSIS — D72819 Decreased white blood cell count, unspecified: Secondary | ICD-10-CM

## 2016-06-04 LAB — CHCC SATELLITE - SMEAR

## 2016-06-04 LAB — CBC WITH DIFFERENTIAL (CANCER CENTER ONLY)
BASO#: 0 10*3/uL (ref 0.0–0.2)
BASO%: 0.3 % (ref 0.0–2.0)
EOS ABS: 0 10*3/uL (ref 0.0–0.5)
EOS%: 1 % (ref 0.0–7.0)
HCT: 38 % (ref 34.8–46.6)
HEMOGLOBIN: 12.7 g/dL (ref 11.6–15.9)
LYMPH#: 1.2 10*3/uL (ref 0.9–3.3)
LYMPH%: 30.9 % (ref 14.0–48.0)
MCH: 26.2 pg (ref 26.0–34.0)
MCHC: 33.4 g/dL (ref 32.0–36.0)
MCV: 79 fL — ABNORMAL LOW (ref 81–101)
MONO#: 0.3 10*3/uL (ref 0.1–0.9)
MONO%: 8.8 % (ref 0.0–13.0)
NEUT%: 59 % (ref 39.6–80.0)
NEUTROS ABS: 2.3 10*3/uL (ref 1.5–6.5)
Platelets: 225 10*3/uL (ref 145–400)
RBC: 4.84 10*6/uL (ref 3.70–5.32)
RDW: 14.4 % (ref 11.1–15.7)
WBC: 3.9 10*3/uL (ref 3.9–10.0)

## 2016-06-04 NOTE — Progress Notes (Signed)
Referral MD  Reason for Referral: Mild leukopenia   Chief Complaint  Patient presents with  . Other    New Patient  : My white blood cell count is low.  HPI: Donna Simmons is a very charming 75 year old African-American female. 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 Her husband is retired Hotel manager. I had fun talking to her about all this.  She is followed by Dr. Patsy Lager. She was found to have some leukopenia. Back in July, her white cell count was 2.8. Hemoglobin 12.4 and platelet count 252,000.  Prior to that, in January, her white cell count 3.8. Hemoglobin 11.4 and platelet count 214,000. Her MCV was 78. She had normal white cell differential.  Her white cell count has been transiently suppressed. Back in December 2014, her white cell count was 3.7.  She has never had problems with infections. She's had no rashes. She's had no weight loss or weight gain. She's had no change in bowel or bladder habits. She is yearly on her mammograms.  She does not smoke. She does not drink. She has no obvious occupational exposures.  There's been no change in medications.  There is no family history of any type of blood problem. There is no sickle cell in the family.  Overall, her performance status is ECOG 0.    Past Medical History:  Diagnosis Date  . Allergy   . GERD (gastroesophageal reflux disease)   . Heart murmur    No significant valvular lesion noted on echo.  . Hypercholesteremia   . Hypertension   . Palpitations 01/10/2009   14 day monitor- some sinus tachycardia, PVCs  . Paroxysmal atrial fibrillation (HCC) 12/27/2008  :  Past Surgical History:  Procedure Laterality Date  . ABDOMINAL HYSTERECTOMY  1980  . BREAST SURGERY    . CARDIAC CATHETERIZATION  12/16/2010   no evidence of CAD to explain anginal pain w/ positive troponin.  potential etiology is breakthrough AF  . FRACTURE SURGERY    . NM MYOVIEW LTD  April 2010    EF 64%, normal pattern of perfusion in all regions, no scintigraphic evidence of inducible ischemia; no significant wall motion abnormalities; EKG negative for ischemia; no significant change from last study; low risk scan  . TRANSTHORACIC ECHOCARDIOGRAM  April 2010   Echo - EF >55; RV mils/mod dilated, RV systolic function mildly reduced; RA mild/moderately dilated; moderate pulmonary hypertension; mild tricuspid regurgitation  :   Current Outpatient Prescriptions:  .  apixaban (ELIQUIS) 5 MG TABS tablet, Take 1 tablet (5 mg total) by mouth 2 (two) times daily., Disp: 60 tablet, Rfl: 5 .  atorvastatin (LIPITOR) 10 MG tablet, TAKE ONE TABLET BY MOUTH ONCE DAILY WITH  LUNCH, Disp: 90 tablet, Rfl: 3 .  conjugated estrogens (PREMARIN) vaginal cream, Place 0.5 Applicatorfuls vaginally 2 (two) times a week., Disp: 42.5 g, Rfl: 1 .  flecainide (TAMBOCOR) 50 MG tablet, Take 1 tablet (50 mg total) by mouth 2 (two) times daily., Disp: 180 tablet, Rfl: 3 .  fluticasone (FLONASE) 50 MCG/ACT nasal spray, Place 2 sprays into both nostrils daily., Disp: 16 g, Rfl: 11 .  lisinopril (PRINIVIL,ZESTRIL) 20 MG tablet, Take 1 tablet (20 mg total) by mouth daily., Disp: 90 tablet, Rfl: 3 .  metoprolol succinate (TOPROL-XL) 25 MG 24 hr tablet, Take 1 tablet (25 mg) daily before dinner, may take an additional 1/2 tablet (12.5 mg) 6 hours after as needed., Disp: 90 tablet, Rfl: 3 .  Multiple Vitamin (MULTIVITAMIN WITH MINERALS) TABS tablet, Take 1 tablet by mouth daily with lunch. Centrum Silver, Disp: , Rfl:  .  omeprazole (PRILOSEC) 20 MG capsule, Take 1 capsule (20 mg total) by mouth daily. Take BID for 14 days every 4 months, Disp: 30 capsule, Rfl: 1 .  sucralfate (CARAFATE) 1 GM/10ML suspension, Take 10 mLs (1 g total) by mouth 4 (four) times daily -  with meals and at bedtime., Disp: 420 mL, Rfl: 0:  :  Allergies  Allergen Reactions  . Antihistamines, Chlorpheniramine-Type Palpitations    Makes heart beat fast   :  Family History  Problem Relation Age of Onset  . Arthritis Mother     OSTEO  . Hypertension Mother   . Dementia Mother   . Heart disease Father   . Hypertension Sister   . Dementia Sister   . Cancer - Lung Brother   . Cancer Maternal Grandmother   . Heart disease Maternal Grandfather   . Cancer - Lung Brother   . Hypertension Brother   . Cancer - Prostate Brother   :  Social History   Social History  . Marital status: Married    Spouse name: N/A  . Number of children: N/A  . Years of education: N/A   Occupational History  . Not on file.   Social History Main Topics  . Smoking status: Never Smoker  . Smokeless tobacco: Never Used  . Alcohol use No  . Drug use: No  . Sexual activity: Yes   Other Topics Concern  . Not on file   Social History Narrative   Married with no children. Walks several times a week at least 3-4 times a week about 2 miles a time. Does not smoke and does not drink.  :  Pertinent items are noted in HPI.  Exam: @IPVITALS @  Thin but well-nourished after meriting female in no obvious distress. Vital signs show temperature of 97.9. Pulse 79. Blood pressure 165/85. Her weight is 130 pounds. Head and neck exam shows no ocular or oral lesions. She has no palpable cervical or supraclavicular lymph nodes. Lungs are clear to percussion and auscultation bilaterally. Cardiac exam regular rate and rhythm with no murmurs, rubs or bruits. Abdomen is soft. She has good bowel sounds. There is no fluid wave. There is no palpable liver or spleen tip. Back exam shows no tenderness over the spine, ribs or hips. Extremities shows no clubbing, cyanosis or edema. Neurological exam shows no focal neurological deficits. Skin exam shows no rashes, ecchymoses or petechia.    Recent Labs  06/04/16 1339  WBC 3.9  HGB 12.7  HCT 38.0  PLT 225   No results for input(s): NA, K, CL, CO2, GLUCOSE, BUN, CREATININE, CALCIUM in the last 72 hours.  Blood smear review:   Normochromic and normocytic population of red blood cells. There are no nucleated red blood cells. There are no teardrop cells. I see no schistocytes or spherocytes. She has no target cells. White blood cells are mildly decreased in number. She has good maturity of her white blood cells. I see no hyper segmented polys. She has no immature myeloid or lymphoid forms. Platelets are adequate in number and size.  Pathology: None     Assessment and Plan:  Donna Simmons is a very nice 75 year old African-American female. She has transient leukopenia. Today, her white cell count is at the low end of normal.  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 Her white cell count has been transiently depressed for several years area and she has been totally asymptomatic with this. She has had a normal white cell differential.  I just do not see any issues with respect to a bone marrow disorder. I do not believe that she has myelodysplasia. I find nothing on her physical exam or on her blood smear that would suggest an underlying hematologic issue.  I spoke to her for about 45 minutes. Again she is very nice. I would like to see her back in about 4 months. I think everything is okay in 4 months, then we can probably let her go from the clinic.  Again, I suspect that this leukopenia is benign and not associated with any bone marrow disorder.

## 2016-06-21 ENCOUNTER — Ambulatory Visit (INDEPENDENT_AMBULATORY_CARE_PROVIDER_SITE_OTHER): Payer: Medicare Other | Admitting: Physician Assistant

## 2016-06-21 DIAGNOSIS — Z23 Encounter for immunization: Secondary | ICD-10-CM | POA: Diagnosis not present

## 2016-07-18 ENCOUNTER — Other Ambulatory Visit: Payer: Self-pay

## 2016-07-18 ENCOUNTER — Emergency Department (HOSPITAL_COMMUNITY)
Admission: EM | Admit: 2016-07-18 | Discharge: 2016-07-19 | Disposition: A | Discharge status: Home / Self Care | Payer: Medicare Other | Attending: Emergency Medicine | Admitting: Emergency Medicine

## 2016-07-18 ENCOUNTER — Encounter (HOSPITAL_COMMUNITY): Payer: Self-pay

## 2016-07-18 ENCOUNTER — Other Ambulatory Visit: Payer: Self-pay | Admitting: Physician Assistant

## 2016-07-18 DIAGNOSIS — R002 Palpitations: Secondary | ICD-10-CM | POA: Diagnosis present

## 2016-07-18 DIAGNOSIS — K219 Gastro-esophageal reflux disease without esophagitis: Secondary | ICD-10-CM

## 2016-07-18 DIAGNOSIS — I10 Essential (primary) hypertension: Secondary | ICD-10-CM | POA: Insufficient documentation

## 2016-07-18 DIAGNOSIS — I480 Paroxysmal atrial fibrillation: Secondary | ICD-10-CM | POA: Insufficient documentation

## 2016-07-18 DIAGNOSIS — Z7901 Long term (current) use of anticoagulants: Secondary | ICD-10-CM | POA: Insufficient documentation

## 2016-07-18 DIAGNOSIS — Z79899 Other long term (current) drug therapy: Secondary | ICD-10-CM | POA: Insufficient documentation

## 2016-07-18 NOTE — ED Provider Notes (Addendum)
[facility]-EMERGENCY DEPT Provider Note   CSN: [id number] Arrival date & time: 07/18/16  2240  By signing my name below, I, Donna Simmons, attest that this documentation has been prepared under the direction and in the presence of Donna Kaplan, MD. Electronically Signed: Ether Simmons, ED Scribe. 07/19/16. 1:37 AM.    History   Chief Complaint Chief Complaint  Patient presents with  . Dizziness     The history is provided by the patient. No language interpreter was used.     HPI Comments: Donna Simmons is a 75 y.o. female with a history of AFIB, GERD and HTN, who presents to the Emergency Department complaining of palpitations which began around 9pm. She describes rapid and irregular heart beat similar to past episodes of AFIB. Pt associated symptoms include dizziness, HA with pain to her temples and across forehead. She also notes burning substernal CP and epigastric secondary to her GERD. She notes she has experienced episodes of AFIB with GERD flare-ups. Pt states she took flecainide PTA and felt little relief at home, however her palpitations have resolved at this time. Pt states that she takes her medication once in the morning and once at night and was advised to take an extra dose when she has palpitations. She denies SOB, vomiting, acute urinary symptoms, abdominal pain, cough, rash, and h/o frequent UTIs.   Past Medical History:  Diagnosis Date  . Allergy   . GERD (gastroesophageal reflux disease)   . Heart murmur    No significant valvular lesion noted on echo.  . Hypercholesteremia   . Hypertension   . Palpitations 01/10/2009   14 day monitor- some sinus tachycardia, PVCs  . Paroxysmal atrial fibrillation (HCC) 12/27/2008    Patient Active Problem List   Diagnosis Date Noted  . Dysphagia 11/05/2015  . Long term current use of anticoagulant therapy 11/18/2012  . Hypercholesteremia 09/25/2012  . DDD (degenerative disc disease), cervical 05/19/2012  . Paroxysmal atrial  fibrillation (HCC) 04/20/2012  . Essential hypertension 04/20/2012  . Palpitations 01/10/2009    Past Surgical History:  Procedure Laterality Date  . ABDOMINAL HYSTERECTOMY  1980  . BREAST SURGERY    . CARDIAC CATHETERIZATION  12/16/2010   no evidence of CAD to explain anginal pain w/ positive troponin.  potential etiology is breakthrough AF  . FRACTURE SURGERY    . NM MYOVIEW LTD  April 2010   EF 64%, normal pattern of perfusion in all regions, no scintigraphic evidence of inducible ischemia; no significant wall motion abnormalities; EKG negative for ischemia; no significant change from last study; low risk scan  . TRANSTHORACIC ECHOCARDIOGRAM  April 2010   Echo - EF >55; RV mils/mod dilated, RV systolic function mildly reduced; RA mild/moderately dilated; moderate pulmonary hypertension; mild tricuspid regurgitation    OB History    No data available       Home Medications    Prior to Admission medications   Medication Sig Start Date End Date Taking? Authorizing Provider  apixaban (ELIQUIS) 5 MG TABS tablet Take 1 tablet (5 mg total) by mouth 2 (two) times daily. 01/14/16  Yes Marykay Lex, MD  atorvastatin (LIPITOR) 10 MG tablet TAKE ONE TABLET BY MOUTH ONCE DAILY WITH  LUNCH 06/02/16  Yes Marykay Lex, MD  flecainide (TAMBOCOR) 50 MG tablet Take 1 tablet (50 mg total) by mouth 2 (two) times daily. 04/04/16  Yes Ok Anis, NP  fluticasone (FLONASE) 50 MCG/ACT nasal spray Place 2 sprays into both nostrils daily. 12/07/15  Yes Ofilia NeasMichael L Clark, PA-C  lisinopril (PRINIVIL,ZESTRIL) 20 MG tablet Take 1 tablet (20 mg total) by mouth daily. 04/04/16  Yes Ok Anishristopher R Berge, NP  metoprolol succinate (TOPROL-XL) 25 MG 24 hr tablet Take 1 tablet (25 mg) daily before dinner, may take an additional 1/2 tablet (12.5 mg) 6 hours after as needed. Patient taking differently: Take 25 mg by mouth daily. may take an additional 1/2 tablet (12.5 mg) 6 hours after as needed. 10/11/15  Yes Marykay Lexavid W  Harding, MD  Multiple Vitamin (MULTIVITAMIN WITH MINERALS) TABS tablet Take 1 tablet by mouth daily with lunch. Centrum Silver   Yes Historical Provider, MD  omeprazole (PRILOSEC) 20 MG capsule Take 1 capsule (20 mg total) by mouth daily. Take BID for 14 days every 4 months Patient taking differently: Take 20 mg by mouth daily.  09/22/15  Yes Todd McVeigh, PA  sucralfate (CARAFATE) 1 GM/10ML suspension Take 10 mLs (1 g total) by mouth 4 (four) times daily -  with meals and at bedtime. 06/03/16  Yes Shade FloodJeffrey R Greene, MD  conjugated estrogens (PREMARIN) vaginal cream Place 0.5 Applicatorfuls vaginally 2 (two) times a week. Patient not taking: Reported on 07/18/2016 12/20/15   Linna HoffJames D Kindl, MD    Family History Family History  Problem Relation Age of Onset  . Arthritis Mother     OSTEO  . Hypertension Mother   . Dementia Mother   . Heart disease Father   . Hypertension Sister   . Dementia Sister   . Cancer - Lung Brother   . Cancer Maternal Grandmother   . Heart disease Maternal Grandfather   . Cancer - Lung Brother   . Hypertension Brother   . Cancer - Prostate Brother     Social History Social History  Substance Use Topics  . Smoking status: Never Smoker  . Smokeless tobacco: Never Used  . Alcohol use No     Allergies   Antihistamines, chlorpheniramine-type   Review of Systems Review of Systems 10 systems reviewed and all are negative for acute change except as noted in the HPI.    Physical Exam Updated Vital Signs BP 138/78   Pulse (!) 58   Resp 15   SpO2 98%   Physical Exam  Constitutional: She appears well-developed and well-nourished. No distress.  No distended neck range Mild D   HENT:  Head: Normocephalic and atraumatic.  Eyes: Conjunctivae are normal.  Neck: Neck supple. JVD (mild) present.  Cardiovascular: Normal rate and regular rhythm.   No murmur heard. Pulmonary/Chest: Effort normal and breath sounds normal. No respiratory distress.  Lungs CTA   Abdominal: Soft. There is no tenderness.  No distended neck veins   Musculoskeletal: She exhibits no edema.  Neurological: She is alert.  Skin: Skin is warm and dry.  Psychiatric: She has a normal mood and affect.  Nursing note and vitals reviewed.    ED Treatments / Results  DIAGNOSTIC STUDIES:  Oxygen Saturation is 99% on RA, normal by my interpretation.    COORDINATION OF CARE:  12:08 AM Discussed treatment plan with pt at bedside and pt agreed to plan.  Labs (all labs ordered are listed, but only abnormal results are displayed) Labs Reviewed  I-STAT CHEM 8, ED - Abnormal; Notable for the following:       Result Value   Potassium 3.4 (*)    All other components within normal limits  MAGNESIUM    EKG  EKG Interpretation None       Radiology  No results found.  Procedures Procedures (including critical care time)  Medications Ordered in ED Medications - No data to display   Initial Impression / Assessment and Plan / ED Course  I have reviewed the triage vital signs and the nursing notes.  Pertinent labs & imaging results that were available during my care of the patient were reviewed by me and considered in my medical decision making (see chart for details).  Clinical Course as of Jul 20 315  Sat Jul 19, 2016  [id number] Patient reassessed. Pt is comfortable at this time. She is in sinus rhythm, Hr in there 60s. Results of the workup discussed. Strict ER return precautions discussed. Follow up instruction discussed, and pt agrees with the plan and is comfortable with it.   [AN]    Clinical Course User Index [AN] Donna KaplanAnkit Lucifer Soja, MD    Pt comes in with cc of palpitations. Hx of Paroxysmal afib, and it appears that she went into Afib, and was symptomatic. Pt took flecainide which took longer than expected to work, but by the time pt came to the ER, she was in sinus rhythm.  Will observe in the ER for a period of time. Hx and exam not indicative of any clear  reason for her to be in afib (infection/acs/pe screens neg).   CHA2DS2/VAS Stroke Risk Points      2 >= 2 Points: High Risk  1 - 1.99 Points: Medium Risk  0 Points: Low Risk    The patient's score has not changed in the past year.:  No Change         Details    Note: External data might be a factor in metrics not marked with    Points Metrics   This score determines the patient's risk of having a stroke if the  patient has atrial fibrillation.       0 Has Congestive Heart Failure:  No   0 Has Vascular Disease:  No   1 Has Hypertension:  Yes   1 Age:  [age](65-74)  0 Has Diabetes:  No   0 Had Stroke:  No Had TIA:  No Had thromboembolism:  No   1 Female:  Yes     This patients CHA2DS2-VASc Score is 3  Above score calculated as 1 point each if present [CHF, HTN, DM, Vascular=MI/PAD/Aortic Plaque, Age if 65-74, or Female] Above score calculated as 2 points each if present [Age > 75, or Stroke/TIA/TE]     Final Clinical Impressions(s) / ED Diagnoses   Final diagnoses:  PAF (paroxysmal atrial fibrillation) (HCC)    New Prescriptions New Prescriptions   No medications on file  I personally performed the services described in this documentation, which was scribed in my presence. The recorded information has been reviewed and is accurate.    Donna KaplanAnkit Vinh Sachs, MD 07/19/16 [id number]    Donna KaplanAnkit Jeily Guthridge, MD 07/26/16 1328

## 2016-07-18 NOTE — ED Triage Notes (Signed)
Pt from home with a onset of chest flutter that lasted . Pt has history of A-fib and is currently in NSR with no pain or flutter.

## 2016-07-19 ENCOUNTER — Emergency Department (HOSPITAL_COMMUNITY)
Admission: EM | Admit: 2016-07-19 | Discharge: 2016-07-19 | Disposition: A | Discharge status: Home / Self Care | Payer: Medicare Other | Source: Home / Self Care | Attending: Emergency Medicine | Admitting: Emergency Medicine

## 2016-07-19 ENCOUNTER — Encounter (HOSPITAL_COMMUNITY): Payer: Self-pay | Admitting: Emergency Medicine

## 2016-07-19 DIAGNOSIS — I10 Essential (primary) hypertension: Secondary | ICD-10-CM

## 2016-07-19 DIAGNOSIS — E860 Dehydration: Secondary | ICD-10-CM

## 2016-07-19 DIAGNOSIS — Z7901 Long term (current) use of anticoagulants: Secondary | ICD-10-CM | POA: Insufficient documentation

## 2016-07-19 DIAGNOSIS — K2100 Gastro-esophageal reflux disease with esophagitis, without bleeding: Secondary | ICD-10-CM

## 2016-07-19 LAB — CBC
HCT: 40.9 % (ref 36.0–46.0)
Hemoglobin: 13.2 g/dL (ref 12.0–15.0)
MCH: 25.4 pg — AB (ref 26.0–34.0)
MCHC: 32.3 g/dL (ref 30.0–36.0)
MCV: 78.7 fL (ref 78.0–100.0)
PLATELETS: 225 10*3/uL (ref 150–400)
RBC: 5.2 MIL/uL — ABNORMAL HIGH (ref 3.87–5.11)
RDW: 13.8 % (ref 11.5–15.5)
WBC: 5.1 10*3/uL (ref 4.0–10.5)

## 2016-07-19 LAB — MAGNESIUM
MAGNESIUM: 2 mg/dL (ref 1.7–2.4)
Magnesium: 2.1 mg/dL (ref 1.7–2.4)

## 2016-07-19 LAB — URINALYSIS, ROUTINE W REFLEX MICROSCOPIC
BILIRUBIN URINE: NEGATIVE
GLUCOSE, UA: NEGATIVE mg/dL
Ketones, ur: 40 mg/dL — AB
Leukocytes, UA: NEGATIVE
Nitrite: NEGATIVE
PH: 5.5 (ref 5.0–8.0)
Protein, ur: NEGATIVE mg/dL
SPECIFIC GRAVITY, URINE: 1.01 (ref 1.005–1.030)

## 2016-07-19 LAB — URINE MICROSCOPIC-ADD ON

## 2016-07-19 LAB — I-STAT CHEM 8, ED
BUN: 7 mg/dL (ref 6–20)
CHLORIDE: 103 mmol/L (ref 101–111)
Calcium, Ion: 1.16 mmol/L (ref 1.15–1.40)
Creatinine, Ser: 0.8 mg/dL (ref 0.44–1.00)
Glucose, Bld: 84 mg/dL (ref 65–99)
HEMATOCRIT: 37 % (ref 36.0–46.0)
HEMOGLOBIN: 12.6 g/dL (ref 12.0–15.0)
POTASSIUM: 3.4 mmol/L — AB (ref 3.5–5.1)
SODIUM: 142 mmol/L (ref 135–145)
TCO2: 25 mmol/L (ref 0–100)

## 2016-07-19 LAB — BASIC METABOLIC PANEL
Anion gap: 12 (ref 5–15)
BUN: 5 mg/dL — AB (ref 6–20)
CALCIUM: 9.6 mg/dL (ref 8.9–10.3)
CO2: 24 mmol/L (ref 22–32)
CREATININE: 0.88 mg/dL (ref 0.44–1.00)
Chloride: 102 mmol/L (ref 101–111)
GFR calc Af Amer: >60 mL/min (ref >60)
GLUCOSE: 85 mg/dL (ref 65–99)
Potassium: 3.1 mmol/L — ABNORMAL LOW (ref 3.5–5.1)
SODIUM: 138 mmol/L (ref 135–145)

## 2016-07-19 LAB — TROPONIN I: Troponin I: <0.03 ng/mL (ref <0.03)

## 2016-07-19 LAB — TSH: TSH: 0.903 u[IU]/mL (ref 0.350–4.500)

## 2016-07-19 MED ORDER — RANITIDINE HCL 150 MG PO TABS: 150.0000 mg | ORAL_TABLET | Freq: Two times a day (BID) | ORAL | 0 refills | Status: DC

## 2016-07-19 MED ORDER — ALUM & MAG HYDROXIDE-SIMETH 200-200-20 MG/5ML PO SUSP: 30.0000 mL | Freq: Four times a day (QID) | ORAL | 0 refills | Status: DC | PRN

## 2016-07-19 MED ORDER — FAMOTIDINE IN NACL 20-0.9 MG/50ML-% IV SOLN
20.0000 mg | Freq: Once | INTRAVENOUS | Status: DC
Start: 2016-07-19 — End: 2016-07-19

## 2016-07-19 MED ORDER — FAMOTIDINE 20 MG PO TABS
20.0000 mg | ORAL_TABLET | Freq: Once | ORAL | Status: AC
  Administered 2016-07-19: 20 mg via ORAL
  Filled 2016-07-19: qty 1

## 2016-07-19 MED ORDER — SODIUM CHLORIDE 0.9 % IV BOLUS (SEPSIS)
1000.0000 mL | Freq: Once | INTRAVENOUS | Status: AC
  Administered 2016-07-19: 1000 mL via INTRAVENOUS

## 2016-07-19 MED ORDER — POTASSIUM CHLORIDE CRYS ER 20 MEQ PO TBCR
40.0000 meq | EXTENDED_RELEASE_TABLET | Freq: Once | ORAL | Status: AC
  Administered 2016-07-19: 40 meq via ORAL
  Filled 2016-07-19: qty 2

## 2016-07-19 MED ORDER — POTASSIUM CHLORIDE CRYS ER 20 MEQ PO TBCR: 40.0000 meq | EXTENDED_RELEASE_TABLET | Freq: Once | ORAL | Status: DC

## 2016-07-19 MED ORDER — GI COCKTAIL ~~LOC~~
30.0000 mL | Freq: Once | ORAL | Status: AC
  Administered 2016-07-19: 30 mL via ORAL
  Filled 2016-07-19: qty 30

## 2016-07-19 NOTE — ED Provider Notes (Signed)
[facility]PT Provider Note   CSN: [id number] Arrival date & time: 07/19/16  1522     History   Chief Complaint Chief Complaint  Patient presents with  . Dizziness  . Palpitations    HPI Donna Simmons is a 75 y.o. female.  HPI 75 year old female with history of paroxysmal A. fib, hypertension, GERD, who presents with burning epigastric pain. Patient was just seen overnight for symptomatically palpitations. She had taken her flecainide prior to arrival and was in normal sinus rhythm with unremarkable screening labs. She was subsequent sent home. She states that she slept well but upon awakening, she experienced an aching, burning, epigastric pain after eating. The pain was worse with lying flat. The pain was similar to her usual GERD pain. However, she noticed that she was markedly hypertensive to the 180s over 100s, although she states this may be due to pain and anxiety. She subsequent presents for evaluation. She also took a double dose of her lisinopril. Currently, patient versus persistent epigastric burning that has not significantly improved. Denies any palpitations and she took her flecainide.. Her symptoms do not feel similar to her palpitations with atrial fibrillation. Denies any recent fevers or chills. Denies any recent medication changes.  Past Medical History:  Diagnosis Date  . Allergy   . GERD (gastroesophageal reflux disease)   . Heart murmur    No significant valvular lesion noted on echo.  . Hypercholesteremia   . Hypertension   . Palpitations 01/10/2009   14 day monitor- some sinus tachycardia, PVCs  . Paroxysmal atrial fibrillation (HCC) 12/27/2008    Patient Active Problem List   Diagnosis Date Noted  . Dysphagia 11/05/2015  . Long term current use of anticoagulant therapy 11/18/2012  . Hypercholesteremia 09/25/2012  . DDD (degenerative disc disease), cervical 05/19/2012  . Paroxysmal atrial fibrillation (HCC) 04/20/2012  . Essential hypertension  04/20/2012  . Palpitations 01/10/2009    Past Surgical History:  Procedure Laterality Date  . ABDOMINAL HYSTERECTOMY  1980  . BREAST SURGERY    . CARDIAC CATHETERIZATION  12/16/2010   no evidence of CAD to explain anginal pain w/ positive troponin.  potential etiology is breakthrough AF  . FRACTURE SURGERY    . NM MYOVIEW LTD  April 2010   EF 64%, normal pattern of perfusion in all regions, no scintigraphic evidence of inducible ischemia; no significant wall motion abnormalities; EKG negative for ischemia; no significant change from last study; low risk scan  . TRANSTHORACIC ECHOCARDIOGRAM  April 2010   Echo - EF >55; RV mils/mod dilated, RV systolic function mildly reduced; RA mild/moderately dilated; moderate pulmonary hypertension; mild tricuspid regurgitation    OB History    No data available       Home Medications    Prior to Admission medications   Medication Sig Start Date End Date Taking? Authorizing Provider  alum & mag hydroxide-simeth (MAALOX ADVANCED) 200-200-20 MG/5ML suspension Take 30 mLs by mouth every 6 (six) hours as needed for indigestion or heartburn. 07/19/16   Shaune Pollack, MD  apixaban (ELIQUIS) 5 MG TABS tablet Take 1 tablet (5 mg total) by mouth 2 (two) times daily. 01/14/16   Marykay Lex, MD  atorvastatin (LIPITOR) 10 MG tablet TAKE ONE TABLET BY MOUTH ONCE DAILY WITH  LUNCH 06/02/16   Marykay Lex, MD  conjugated estrogens (PREMARIN) vaginal cream Place 0.5 Applicatorfuls vaginally 2 (two) times a week. Patient not taking: Reported on 07/18/2016 12/20/15   Linna Hoff, MD  flecainide [facility])  50 MG tablet Take 1 tablet (50 mg total) by mouth 2 (two) times daily. 04/04/16   Ok Anis, NP  fluticasone (FLONASE) 50 MCG/ACT nasal spray Place 2 sprays into both nostrils daily. 12/07/15   Ofilia Neas, PA-C  lisinopril (PRINIVIL,ZESTRIL) 20 MG tablet Take 1 tablet (20 mg total) by mouth daily. 04/04/16   Ok Anis, NP  metoprolol  succinate (TOPROL-XL) 25 MG 24 hr tablet Take 1 tablet (25 mg) daily before dinner, may take an additional 1/2 tablet (12.5 mg) 6 hours after as needed. Patient taking differently: Take 25 mg by mouth daily. may take an additional 1/2 tablet (12.5 mg) 6 hours after as needed. 10/11/15   Marykay Lex, MD  Multiple Vitamin (MULTIVITAMIN WITH MINERALS) TABS tablet Take 1 tablet by mouth daily with lunch. Centrum Silver    Historical Provider, MD  omeprazole (PRILOSEC) 20 MG capsule Take 1 capsule (20 mg total) by mouth daily. Take BID for 14 days every 4 months Patient taking differently: Take 20 mg by mouth daily.  09/22/15   Raelyn Ensign, PA  ranitidine (ZANTAC) 150 MG tablet Take 1 tablet (150 mg total) by mouth 2 (two) times daily. 07/19/16 07/26/16  Shaune Pollack, MD  sucralfate (CARAFATE) 1 GM/10ML suspension Take 10 mLs (1 g total) by mouth 4 (four) times daily -  with meals and at bedtime. 06/03/16   [name], MD    Family History Family History  Problem Relation Age of Onset  . Arthritis Mother     OSTEO  . Hypertension Mother   . Dementia Mother   . Heart disease Father   . Hypertension Sister   . Dementia Sister   . Cancer - Lung Brother   . Cancer Maternal Grandmother   . Heart disease Maternal Grandfather   . Cancer - Lung Brother   . Hypertension Brother   . Cancer - Prostate Brother     Social History Social History  Substance Use Topics  . Smoking status: Never Smoker  . Smokeless tobacco: Never Used  . Alcohol use No     Allergies   Antihistamines, chlorpheniramine-type   Review of Systems Review of Systems  Constitutional: Negative for chills, fatigue and fever.  HENT: Negative for congestion and rhinorrhea.   Eyes: Negative for visual disturbance.  Respiratory: Negative for cough, shortness of breath and wheezing.   Cardiovascular: Negative for chest pain and leg swelling.  Gastrointestinal: Positive for abdominal pain. Negative for diarrhea,  nausea and vomiting.  Genitourinary: Negative for dysuria and flank pain.  Musculoskeletal: Negative for neck pain and neck stiffness.  Skin: Negative for rash and wound.  Allergic/Immunologic: Negative for immunocompromised state.  Neurological: Negative for syncope, weakness and headaches.  All other systems reviewed and are negative.    Physical Exam Updated Vital Signs BP 147/78   Pulse (!) 59   Temp 97.7 F (36.5 C) (Oral)   Resp 17   Ht 5\' 4"  (1.626 m)   Wt 135 lb (61.2 kg)   SpO2 100%   BMI 23.17 kg/m   Physical Exam  Constitutional: She is oriented to person, place, and time. She appears well-developed and well-nourished. No distress.  HENT:  Head: Normocephalic and atraumatic.  Eyes: Conjunctivae are normal.  Neck: Neck supple.  Cardiovascular: Normal rate, regular rhythm and normal heart sounds.  Exam reveals no friction rub.   No murmur heard. Pulmonary/Chest: Effort normal and breath sounds normal. No respiratory distress. She has no wheezes. She  has no rales.  Abdominal: Soft. Bowel sounds are normal. She exhibits no distension. There is tenderness (Mild, epigastric). There is no rebound and no guarding.  Musculoskeletal: She exhibits no edema.  Neurological: She is alert and oriented to person, place, and time. She exhibits normal muscle tone.  Skin: Skin is warm. Capillary refill takes less than 2 seconds.  Psychiatric: She has a normal mood and affect.  Nursing note and vitals reviewed.    ED Treatments / Results  Labs (all labs ordered are listed, but only abnormal results are displayed) Labs Reviewed  CBC - Abnormal; Notable for the following:       Result Value   RBC 5.20 (*)    MCH 25.4 (*)    All other components within normal limits  BASIC METABOLIC PANEL - Abnormal; Notable for the following:    Potassium 3.1 (*)    BUN 5 (*)    All other components within normal limits  URINALYSIS, ROUTINE W REFLEX MICROSCOPIC (NOT AT [facility]) - Abnormal;  Notable for the following:    Hgb urine dipstick MODERATE (*)    Ketones, ur 40 (*)    All other components within normal limits  URINE MICROSCOPIC-ADD ON - Abnormal; Notable for the following:    Squamous Epithelial / LPF 0-5 (*)    Bacteria, UA RARE (*)    All other components within normal limits  MAGNESIUM  TSH  TROPONIN I    EKG  EKG Interpretation  Date/Time:  Saturday July 19 2016 18:57:16 EST Ventricular Rate:  67 PR Interval:    QRS Duration: 99 QT Interval:  418 QTC Calculation: 442 R Axis:   -4 Text Interpretation:  Sinus rhythm Atrial premature complex Probable left ventricular hypertrophy No significant change since last tracing Confirmed by Jacqueleen Pulver MD, Sheria Lang [phone]) on 07/19/2016 7:27:46 PM       Radiology No results found.  Procedures Procedures (including critical care time)  Medications Ordered in ED Medications  gi cocktail (Maalox,Lidocaine,Donnatal) (30 mLs Oral Given 07/19/16 1914)  potassium chloride SA (K-DUR,KLOR-CON) CR tablet 40 mEq (40 mEq Oral Given 07/19/16 1953)  famotidine (PEPCID) tablet 20 mg (20 mg Oral Given 07/19/16 1953)  sodium chloride 0.9 % bolus 1,000 mL (0 mLs Intravenous Stopped 07/19/16 2202)     Initial Impression / Assessment and Plan / ED Course  I have reviewed the triage vital signs and the nursing notes.  Pertinent labs & imaging results that were available during my care of the patient were reviewed by me and considered in my medical decision making (see chart for details).  Clinical Course     75 yo F with PMHx of pAFib, GERD, HTN here with burning epigastric pain that started after eating. On arrival, VSS and WNL. Exam is as above. Pt is well-appearing and in NAD. She has mild epigastric TTP. Primary suspicion is acute on chronic GERD with esophagitis. Pt states pain feels exactly similar to her prior episodes and began after eating. She o/w denies complaints. Denies any overt CP, SOB, or palpitations and  states her palpitations from yesterday are resolved. Will check screening labs, give GI cocktail and reassess.   Labs as above.  CBC unremarkable. BMP with mild hypoK, which was replated. UA with mild dehydration, otherwise no evidence of UTI. EKG unremarkable as above. Troponin negative, reassuring in setting of constant sx for >6 hours and I do not suspect ACS.  Following GI cocktail, sx completely resolved. Pt states she "feels like dancing." She  also feels better after 1L NS and is ambulatory without difficulty, well appearing. Given reassuring labs and work-up, will d/c on zantac, maalox (as carafate has not helped previously), and d/c with outpt follow-up.  Final Clinical Impressions(s) / ED Diagnoses   Final diagnoses:  Gastroesophageal reflux disease with esophagitis  Dehydration    New Prescriptions Discharge Medication List as of 07/19/2016  9:31 PM    START taking these medications   Details  alum & mag hydroxide-simeth (MAALOX ADVANCED) 200-200-20 MG/5ML suspension Take 30 mLs by mouth every 6 (six) hours as needed for indigestion or heartburn., Starting Sat 07/19/2016, Print    ranitidine (ZANTAC) 150 MG tablet Take 1 tablet (150 mg total) by mouth 2 (two) times daily., Starting Sat 07/19/2016, Until Sat 07/26/2016, Print         Shaune Pollackameron Donnesha Karg, MD 07/20/16 0000

## 2016-07-19 NOTE — Discharge Instructions (Signed)
If your blood pressure continues to increase, it is OKAY to take ONE and ONE HALF of your lisinopril pills (for a total of 30 mg) per day. Do NOT take more than this.  However, you should not start doing this until you call your doctor on Monday as you will need labs checked once you increase it. I think your high blood pressure today may be related to the pain from your reflux, which should get better with the prescribed medications.

## 2016-07-19 NOTE — ED Triage Notes (Signed)
Seen in ED last night for AFib. An hour ago felt dizzy and took blood pressure. It was 189/84. Denies CP, denies SOB. Reports the flutter feeling is still there. No longer feels dizzy.

## 2016-07-19 NOTE — Discharge Instructions (Signed)
You are back in normal sinus rhythm - and stayed that way for the entire duration in the ER.  See your doctor as needed.

## 2016-07-21 ENCOUNTER — Telehealth: Payer: Self-pay | Admitting: Cardiology

## 2016-07-21 NOTE — Telephone Encounter (Signed)
Line busy when dialed. 

## 2016-07-21 NOTE — Telephone Encounter (Signed)
Please call,question about Lipitor.

## 2016-07-22 ENCOUNTER — Telehealth (HOSPITAL_COMMUNITY): Payer: Self-pay | Admitting: *Deleted

## 2016-07-22 MED ORDER — LISINOPRIL 30 MG PO TABS: 20.0000 mg | ORAL_TABLET | Freq: Every day | ORAL | 1 refills | Status: DC

## 2016-07-22 NOTE — Telephone Encounter (Signed)
Have her continue with 30 mg daily, get BMET in 7-10 days.  If her pressure continues to hold > 140 systolic after 1-2 weeks on the higher dose, she will need to have a CVRR hypertension OV

## 2016-07-22 NOTE — Telephone Encounter (Signed)
Spoke with pt states that she went to there ER for Palpitations and they told her it was just her GERD. Also her BP was high (185/108-@home  and 147/78@ER ) so their recommendation was-per Note:  If your blood pressure continues to increase, it is OKAY to take ONE and ONE HALF of your lisinopril pills (for a total of 30 mg) per day. Do NOT take more than this.   However, you should not start doing this until you call your doctor on Monday as you will need labs checked once you increase it. I think your high blood pressure today may be related to the pain from your reflux, which should get better with the prescribed medications.

## 2016-07-22 NOTE — Telephone Encounter (Signed)
Pt notified she will have lab done next week, I sent in refill for new lisinopril 30 mg dose Need to call back with appt for 1-2 weeks w/CVRR

## 2016-07-22 NOTE — Telephone Encounter (Signed)
Pt on  ED afib follow up report, however, not appropriate for follow up.  Discharge and presenting NSR and dx GERD.

## 2016-07-22 NOTE — Telephone Encounter (Signed)
Ok to schedule 07-31-16@noon  per Landis MartinsKelly [facility]  Appt scheduled and pt notified she will call back and cancel if appt is not needed

## 2016-07-24 NOTE — Telephone Encounter (Signed)
Thanks for helping out Donna Simmons, Donna Simmons& Donna Simmons.  Phone notes are very hard to keep up with while rounding.  Bryan Lemmaavid Harding, MD

## 2016-07-31 ENCOUNTER — Ambulatory Visit (INDEPENDENT_AMBULATORY_CARE_PROVIDER_SITE_OTHER): Payer: Medicare Other | Admitting: Pharmacist Clinician (PhC)/ Clinical Pharmacy Specialist

## 2016-07-31 ENCOUNTER — Encounter: Payer: Self-pay | Admitting: Pharmacist Clinician (PhC)/ Clinical Pharmacy Specialist

## 2016-07-31 DIAGNOSIS — I10 Essential (primary) hypertension: Secondary | ICD-10-CM

## 2016-07-31 MED ORDER — AMLODIPINE BESYLATE 5 MG PO TABS: 5.0000 mg | ORAL_TABLET | Freq: Every day | ORAL | 3 refills | Status: DC

## 2016-07-31 NOTE — Assessment & Plan Note (Signed)
Patient still with elevated BP in the office today at 164/96.  Will add amlodipine 5 mg daily and have asked that she take it about 12 hours apart from her lisinopril.  She is to continue with home BP monitoring and will come back in 3 weeks for follow up.

## 2016-07-31 NOTE — Patient Instructions (Signed)
Return for a a follow up appointment in 3-4 weeks  Your blood pressure today is 164/96  (goal is < 130/80)  Check your blood pressure at home daily and keep record of the readings.  Take your BP meds as follows:  Lisinopril 30 mg each morning  Amlodipine 5 mg each evening  Bring all of your meds, your BP cuff and your record of home blood pressures to your next appointment.  Exercise as you're able, try to walk approximately 30 minutes per day.  Keep salt intake to a minimum, especially watch canned and prepared boxed foods.  Eat more fresh fruits and vegetables and fewer canned items.  Avoid eating in fast food restaurants.    HOW TO TAKE YOUR BLOOD PRESSURE: . Rest 5 minutes before taking your blood pressure. .  Don't smoke or drink caffeinated beverages for at least 30 minutes before. . Take your blood pressure before (not after) you eat. . Sit comfortably with your back supported and both feet on the floor (don't cross your legs). . Elevate your arm to heart level on a table or a desk. . Use the proper sized cuff. It should fit smoothly and snugly around your bare upper arm. There should be enough room to slip a fingertip under the cuff. The bottom edge of the cuff should be 1 inch above the crease of the elbow. . Ideally, take 3 measurements at one sitting and record the average.

## 2016-07-31 NOTE — Progress Notes (Signed)
07/31/2016 Donna MaserMary P Trimble 01/08/1941 [id number]   HPI:  Donna Simmons is a 75 y.o. female patient of Dr Herbie BaltimoreHarding, with a PMH below who presents today for hypertension clinic evaluation.  She has had controlled hypertension for some time, noting that when she has bouts of GERD her pressure seems to get worse. She was at ER on Nov 17 and 18,  for what she though was palpitations but may have actually been GERD.  She was noted to have a BP of 185/108 and was told to take an extra 1/2 tablet of her lisinopril (30 mg total).  She was then ordered a 30 mg tablet and is confused about why she should take 45 mg daily.    Blood Pressure Goal:  130/80    Current Medications:  Lisinopril 30 mg qd  Metoprolol succ 25 mg qd  Cardiac Hx:  AF, hyperlipidemia, hypertension  Family Hx:  Father died from CHF at age 73, sister from MI at 7480.     Social Hx:  No tobacco or alcohol, rare caffeine  Diet:  Eats mostly home cooked foods, does not add salt with cooking or at table, eats more fish and chicken, rare beef or pork  Exercise:  Walks 2 miles 3 times per week; [facility] [employer]  Home BP readings:  Brought home meter (without cuff) average 155/84 (range 140-154/77-93)  Intolerances:   No antihypertensive intolerances  Wt Readings from Last 3 Encounters:  07/19/16 135 lb (61.2 kg)  06/04/16 130 lb 1.9 oz (59 kg)  06/03/16 131 lb (59.4 kg)   BP Readings from Last 3 Encounters:  07/31/16 (!) 164/96  07/19/16 147/78  07/19/16 138/78   Pulse Readings from Last 3 Encounters:  07/31/16 67  07/19/16 (!) 59  07/19/16 (!) 58    Current Outpatient Prescriptions  Medication Sig Dispense Refill  . alum & mag hydroxide-simeth (MAALOX ADVANCED) 200-200-20 MG/5ML suspension Take 30 mLs by mouth every 6 (six) hours as needed for indigestion or heartburn. 355 mL 0  . amLODipine (NORVASC) 5 MG tablet Take 1 tablet (5 mg total) by mouth daily. 30 tablet 3  .  apixaban (ELIQUIS) 5 MG TABS tablet Take 1 tablet (5 mg total) by mouth 2 (two) times daily. 60 tablet 5  . atorvastatin (LIPITOR) 10 MG tablet TAKE ONE TABLET BY MOUTH ONCE DAILY WITH  LUNCH 90 tablet 3  . conjugated estrogens (PREMARIN) vaginal cream Place 0.5 Applicatorfuls vaginally 2 (two) times a week. (Patient not taking: Reported on 07/18/2016) 42.5 g 1  . flecainide (TAMBOCOR) 50 MG tablet Take 1 tablet (50 mg total) by mouth 2 (two) times daily. 180 tablet 3  . fluticasone (FLONASE) 50 MCG/ACT nasal spray Place 2 sprays into both nostrils daily. 16 g 11  . lisinopril (PRINIVIL,ZESTRIL) 30 MG tablet Take 0.5 tablets (15 mg total) by mouth daily. 90 tablet 1  . metoprolol succinate (TOPROL-XL) 25 MG 24 hr tablet Take 1 tablet (25 mg) daily before dinner, may take an additional 1/2 tablet (12.5 mg) 6 hours after as needed. (Patient taking differently: Take 25 mg by mouth daily. may take an additional 1/2 tablet (12.5 mg) 6 hours after as needed.) 90 tablet 3  . Multiple Vitamin (MULTIVITAMIN WITH MINERALS) TABS tablet Take 1 tablet by mouth daily with lunch. Centrum Silver    . omeprazole (PRILOSEC) 20 MG capsule TAKE ONE CAPSULE ONCE DAILY, THEN TAKE ONE TWICE DAILY FOR 14 DAYS  EVERY 4 MONTHS 90 capsule 1  . ranitidine (ZANTAC) 150 MG tablet Take 1 tablet (150 mg total) by mouth 2 (two) times daily. 14 tablet 0  . sucralfate (CARAFATE) 1 GM/10ML suspension Take 10 mLs (1 g total) by mouth 4 (four) times daily -  with meals and at bedtime. 420 mL 0   No current facility-administered medications for this visit.     Allergies  Allergen Reactions  . Antihistamines, Chlorpheniramine-Type Palpitations    Makes heart beat fast    Past Medical History:  Diagnosis Date  . Allergy   . GERD (gastroesophageal reflux disease)   . Heart murmur    No significant valvular lesion noted on echo.  . Hypercholesteremia   . Hypertension   . Palpitations 01/10/2009   14 day monitor- some sinus  tachycardia, PVCs  . Paroxysmal atrial fibrillation (HCC) 12/27/2008    Blood pressure (!) 164/96, pulse 67.  Essential hypertension Patient still with elevated BP in the office today at 164/96.  Will add amlodipine 5 mg daily and have asked that she take it about 12 hours apart from her lisinopril.  She is to continue with home BP monitoring and will come back in 3 weeks for follow up.   Phillips HayKristin Telesa Jeancharles PharmD CPP  [facility]

## 2016-08-22 ENCOUNTER — Ambulatory Visit: Payer: Medicare Other

## 2016-08-27 NOTE — Telephone Encounter (Signed)
error 

## 2016-09-11 ENCOUNTER — Ambulatory Visit (INDEPENDENT_AMBULATORY_CARE_PROVIDER_SITE_OTHER): Payer: Medicare Other | Admitting: Pharmacist Clinician (PhC)/ Clinical Pharmacy Specialist

## 2016-09-11 DIAGNOSIS — I10 Essential (primary) hypertension: Secondary | ICD-10-CM

## 2016-09-11 NOTE — Assessment & Plan Note (Signed)
Today her pressure is slightly higher than she has been getting at home, at 146/88.  She will continue with the same medications and continue to monitor home BP.  Should she see a trend to >150 she knows to call the office for follow up.

## 2016-09-11 NOTE — Progress Notes (Signed)
09/11/2016 Donna MaserMary P Monette 1940-10-27 [id number]   HPI:  Donna Simmons is a 76 y.o. female patient of Dr Herbie BaltimoreHarding, with a PMH below who presents today for hypertension clinic evaluation.  She has had controlled hypertension for some time, noting that when she has bouts of GERD her pressure seems to get worse. She was at ER on Nov 17 and 18,  for what she though was palpitations but may have actually been GERD. At her last visit in with me her pressure was 164/96 and amlodipine 5 mg was added to her lisinopril and metoprolol.  She reports no side effects or problems since adding that on.    Today she reports home BP readings are much improved, in that all of her readings were between 120-150 systolic.  She has just come from her ophthalmologist appointment and is a little upset as she will need to have cataract surgery in both eyes, the first being scheduled for February.     Blood Pressure Goal:  130/80    Current Medications:  Lisinopril 30 mg qd  Metoprolol succ 25 mg qd  Amlodipine 5 mg qd  Cardiac Hx:  AF, hyperlipidemia, hypertension  Family Hx:  Father died from CHF at age 74, sister from MI at 4980.     Social Hx:  No tobacco or alcohol, rare caffeine  Diet:  Eats mostly home cooked foods, does not add salt with cooking or at table, eats more fish and chicken, rare beef or pork  Exercise:  Walks 2 miles 3 times per week; [facility] [employer]  Home BP readings:  States home readings have improved greatly.  Consistently 120-150 systolic.  Intolerances:   No antihypertensive intolerances  Wt Readings from Last 3 Encounters:  07/19/16 135 lb (61.2 kg)  06/04/16 130 lb 1.9 oz (59 kg)  06/03/16 131 lb (59.4 kg)   BP Readings from Last 3 Encounters:  09/11/16 (!) 146/88  07/31/16 (!) 164/96  07/19/16 147/78   Pulse Readings from Last 3 Encounters:  09/11/16 68  07/31/16 67  07/19/16 (!) 59    Current Outpatient Prescriptions    Medication Sig Dispense Refill  . alum & mag hydroxide-simeth (MAALOX ADVANCED) 200-200-20 MG/5ML suspension Take 30 mLs by mouth every 6 (six) hours as needed for indigestion or heartburn. 355 mL 0  . amLODipine (NORVASC) 5 MG tablet Take 1 tablet (5 mg total) by mouth daily. 30 tablet 3  . apixaban (ELIQUIS) 5 MG TABS tablet Take 1 tablet (5 mg total) by mouth 2 (two) times daily. 60 tablet 5  . atorvastatin (LIPITOR) 10 MG tablet TAKE ONE TABLET BY MOUTH ONCE DAILY WITH  LUNCH 90 tablet 3  . conjugated estrogens (PREMARIN) vaginal cream Place 0.5 Applicatorfuls vaginally 2 (two) times a week. (Patient not taking: Reported on 07/18/2016) 42.5 g 1  . flecainide (TAMBOCOR) 50 MG tablet Take 1 tablet (50 mg total) by mouth 2 (two) times daily. 180 tablet 3  . fluticasone (FLONASE) 50 MCG/ACT nasal spray Place 2 sprays into both nostrils daily. 16 g 11  . lisinopril (PRINIVIL,ZESTRIL) 30 MG tablet Take 0.5 tablets (15 mg total) by mouth daily. 90 tablet 1  . metoprolol succinate (TOPROL-XL) 25 MG 24 hr tablet Take 1 tablet (25 mg) daily before dinner, may take an additional 1/2 tablet (12.5 mg) 6 hours after as needed. (Patient taking differently: Take 25 mg by mouth daily. may take an additional 1/2 tablet (12.5  mg) 6 hours after as needed.) 90 tablet 3  . Multiple Vitamin (MULTIVITAMIN WITH MINERALS) TABS tablet Take 1 tablet by mouth daily with lunch. Centrum Silver    . omeprazole (PRILOSEC) 20 MG capsule TAKE ONE CAPSULE ONCE DAILY, THEN TAKE ONE TWICE DAILY FOR 14 DAYS EVERY 4 MONTHS 90 capsule 1  . ranitidine (ZANTAC) 150 MG tablet Take 1 tablet (150 mg total) by mouth 2 (two) times daily. 14 tablet 0  . sucralfate (CARAFATE) 1 GM/10ML suspension Take 10 mLs (1 g total) by mouth 4 (four) times daily -  with meals and at bedtime. 420 mL 0   No current facility-administered medications for this visit.     Allergies  Allergen Reactions  . Antihistamines, Chlorpheniramine-Type Palpitations     Makes heart beat fast    Past Medical History:  Diagnosis Date  . Allergy   . GERD (gastroesophageal reflux disease)   . Heart murmur    No significant valvular lesion noted on echo.  . Hypercholesteremia   . Hypertension   . Palpitations 01/10/2009   14 day monitor- some sinus tachycardia, PVCs  . Paroxysmal atrial fibrillation (HCC) 12/27/2008    Blood pressure (!) 146/88, pulse 68.  Essential hypertension Today her pressure is slightly higher than she has been getting at home, at 146/88.  She will continue with the same medications and continue to monitor home BP.  Should she see a trend to >150 she knows to call the office for follow up.     Phillips Hay PharmD CPP [facility]

## 2016-09-11 NOTE — Patient Instructions (Addendum)
   Your blood pressure today is 146/88  Check your blood pressure at home several times each week and keep record of the readings.  Take your BP meds as follows:  Continue with your lisinopril and amlodipine  Bring all of your meds, your BP cuff and your record of home blood pressures to your next appointment.  Exercise as you're able, try to walk approximately 30 minutes per day.  Keep salt intake to a minimum, especially watch canned and prepared boxed foods.  Eat more fresh fruits and vegetables and fewer canned items.  Avoid eating in fast food restaurants.    HOW TO TAKE YOUR BLOOD PRESSURE: . Rest 5 minutes before taking your blood pressure. .  Don't smoke or drink caffeinated beverages for at least 30 minutes before. . Take your blood pressure before (not after) you eat. . Sit comfortably with your back supported and both feet on the floor (don't cross your legs). . Elevate your arm to heart level on a table or a desk. . Use the proper sized cuff. It should fit smoothly and snugly around your bare upper arm. There should be enough room to slip a fingertip under the cuff. The bottom edge of the cuff should be 1 inch above the crease of the elbow. . Ideally, take 3 measurements at one sitting and record the average.

## 2016-09-23 ENCOUNTER — Other Ambulatory Visit: Payer: Self-pay | Admitting: Cardiology

## 2016-10-03 ENCOUNTER — Other Ambulatory Visit: Payer: Self-pay | Admitting: *Deleted

## 2016-10-03 DIAGNOSIS — Z7901 Long term (current) use of anticoagulants: Secondary | ICD-10-CM

## 2016-10-06 ENCOUNTER — Encounter: Payer: Self-pay | Admitting: Cardiology

## 2016-10-06 ENCOUNTER — Ambulatory Visit (HOSPITAL_BASED_OUTPATIENT_CLINIC_OR_DEPARTMENT_OTHER): Payer: Medicare Other | Admitting: Hematology & Oncology

## 2016-10-06 ENCOUNTER — Ambulatory Visit (INDEPENDENT_AMBULATORY_CARE_PROVIDER_SITE_OTHER): Payer: Medicare Other | Admitting: Cardiology

## 2016-10-06 ENCOUNTER — Other Ambulatory Visit (HOSPITAL_BASED_OUTPATIENT_CLINIC_OR_DEPARTMENT_OTHER): Payer: Medicare Other

## 2016-10-06 VITALS — BP 142/84 | HR 68 | Ht 64.0 in | Wt 129.0 lb

## 2016-10-06 VITALS — BP 148/85 | HR 72 | Temp 98.4°F | Wt 128.1 lb

## 2016-10-06 DIAGNOSIS — I10 Essential (primary) hypertension: Secondary | ICD-10-CM | POA: Diagnosis not present

## 2016-10-06 DIAGNOSIS — R002 Palpitations: Secondary | ICD-10-CM

## 2016-10-06 DIAGNOSIS — Z7901 Long term (current) use of anticoagulants: Secondary | ICD-10-CM

## 2016-10-06 DIAGNOSIS — D708 Other neutropenia: Secondary | ICD-10-CM

## 2016-10-06 DIAGNOSIS — D72819 Decreased white blood cell count, unspecified: Secondary | ICD-10-CM

## 2016-10-06 DIAGNOSIS — I480 Paroxysmal atrial fibrillation: Secondary | ICD-10-CM

## 2016-10-06 LAB — CBC WITH DIFFERENTIAL (CANCER CENTER ONLY)
BASO#: 0 10*3/uL (ref 0.0–0.2)
BASO%: 0.2 % (ref 0.0–2.0)
EOS ABS: 0 10*3/uL (ref 0.0–0.5)
EOS%: 0.2 % (ref 0.0–7.0)
HEMATOCRIT: 35.9 % (ref 34.8–46.6)
HGB: 11.8 g/dL (ref 11.6–15.9)
LYMPH#: 1.1 10*3/uL (ref 0.9–3.3)
LYMPH%: 27.3 % (ref 14.0–48.0)
MCH: 25.9 pg — ABNORMAL LOW (ref 26.0–34.0)
MCHC: 32.9 g/dL (ref 32.0–36.0)
MCV: 79 fL — AB (ref 81–101)
MONO#: 0.3 10*3/uL (ref 0.1–0.9)
MONO%: 7.3 % (ref 0.0–13.0)
NEUT#: 2.7 10*3/uL (ref 1.5–6.5)
NEUT%: 65 % (ref 39.6–80.0)
Platelets: 233 10*3/uL (ref 145–400)
RBC: 4.55 10*6/uL (ref 3.70–5.32)
RDW: 14.9 % (ref 11.1–15.7)
WBC: 4.1 10*3/uL (ref 3.9–10.0)

## 2016-10-06 LAB — CHCC SATELLITE - SMEAR

## 2016-10-06 MED ORDER — AMLODIPINE BESYLATE 10 MG PO TABS: 10.0000 mg | ORAL_TABLET | Freq: Every day | ORAL | 3 refills | Status: DC

## 2016-10-06 NOTE — Progress Notes (Signed)
PCP: Ellsworth Lennox, MD  Clinic Note: Chief Complaint  Patient presents with  . Follow-up    no complaints  . Atrial Fibrillation  . Hypertension    HPI: Donna Simmons is a 76 y.o. female with a PMH below who presents today for annual follow-up for PAF. She also has hypertension and at baseline frequent PACs and PVCs. She is anticoagulated with ELIQUIS and flecainide for rhythm control. I first met her in April 2012 when she presented with symptoms concerning for unstable angina/mild troponin elevation in the setting of atrial fibrillation. She had normal coronary arteries, so the elevated troponin was thought to be related to mild heart failure symptoms and A. fib RVR  I last saw [name] in February 2017. She was doing relatively well at that time.  She saw Ignacia Bayley, NP in August - was doing well without complaints.  She has also been seeing by Tommy Medal, [facility] for HTN - last seen in November.   Recent Hospitalizations: November 2017 - she went to the emergency room for symptoms of A. fib, but by the time she arrived, she had converted to sinus rhythm. She had taken her flecainide, but did not notice any relief. By the time she arrived to the emergency room she was in sinus rhythm.  Studies Reviewed: None  Interval History: Amiee presents today pretty much doing well with no recurrent episodes since November. This is the only time she notices having any chest discomfort is when she is in A. fib. Otherwise he denies any resting or exertional chest tightness or pressure. Since starting flecainide, her palpitations of been extremely well-controlled.  Minimal palpitations without lightheadedness, dizziness, weakness or syncope/near syncope. No PND, orthopnea or edema. No chest pain or shortness of breath with rest or exertion. No TIA/amaurosis fugax symptoms. No melena, hematochezia, hematuria, or epstaxis. No claudication.  ROS: A comprehensive was  performed. Review of Systems  Constitutional: Negative for malaise/fatigue.  HENT: Negative for nosebleeds.   Respiratory:       Recovered from a cold, but otherwise doing well  Cardiovascular:       Per history of present illness  Gastrointestinal: Negative for abdominal pain.  Genitourinary: Negative for dysuria.  Skin: Negative.   Neurological: Positive for dizziness (Only when having A. fib episodes).  Psychiatric/Behavioral: The patient is not nervous/anxious.   All other systems reviewed and are negative.   Past Medical History:  Diagnosis Date  . Allergy   . GERD (gastroesophageal reflux disease)   . Heart murmur    No significant valvular lesion noted on echo.  . Hypercholesteremia   . Hypertension   . Palpitations 01/10/2009   14 day monitor- some sinus tachycardia, PVCs  . Paroxysmal atrial fibrillation ([place]) 12/27/2008    Past Surgical History:  Procedure Laterality Date  . ABDOMINAL HYSTERECTOMY  1980  . BREAST SURGERY    . CARDIAC CATHETERIZATION  12/16/2010   no evidence of CAD to explain anginal pain w/ positive troponin.  potential etiology is breakthrough AF  . FRACTURE SURGERY    . NM MYOVIEW LTD  April 2010   EF 64%, normal pattern of perfusion in all regions, no scintigraphic evidence of inducible ischemia; no significant wall motion abnormalities; EKG negative for ischemia; no significant change from last study; low risk scan  . TRANSTHORACIC ECHOCARDIOGRAM  April 2010   Echo - EF >55; RV mils/mod dilated, RV systolic function mildly reduced; RA mild/moderately dilated; moderate pulmonary hypertension; mild tricuspid regurgitation  Current Meds  Medication Sig  . alum & mag hydroxide-simeth (MAALOX ADVANCED) 200-200-20 MG/5ML suspension Take 30 mLs by mouth every 6 (six) hours as needed for indigestion or heartburn.  Marland Kitchen atorvastatin (LIPITOR) 10 MG tablet TAKE ONE TABLET BY MOUTH ONCE DAILY WITH  LUNCH  . conjugated estrogens (PREMARIN) vaginal cream  Place 0.5 Applicatorfuls vaginally 2 (two) times a week.  Marland Kitchen ELIQUIS 5 MG TABS tablet TAKE ONE TABLET BY MOUTH TWICE DAILY  . flecainide (TAMBOCOR) 50 MG tablet Take 1 tablet (50 mg total) by mouth 2 (two) times daily.  . fluticasone (FLONASE) 50 MCG/ACT nasal spray Place 2 sprays into both nostrils daily.  Marland Kitchen lisinopril (PRINIVIL,ZESTRIL) 30 MG tablet Take 0.5 tablets (15 mg total) by mouth daily.  . metoprolol succinate (TOPROL-XL) 25 MG 24 hr tablet Take 1 tablet (25 mg) daily before dinner, may take an additional 1/2 tablet (12.5 mg) 6 hours after as needed. (Patient taking differently: Take 25 mg by mouth daily. may take an additional 1/2 tablet (12.5 mg) 6 hours after as needed.)  . Multiple Vitamin (MULTIVITAMIN WITH MINERALS) TABS tablet Take 1 tablet by mouth daily with lunch. Centrum Silver  . omeprazole (PRILOSEC) 20 MG capsule TAKE ONE CAPSULE ONCE DAILY, THEN TAKE ONE TWICE DAILY FOR 14 DAYS EVERY 4 MONTHS  . ranitidine (ZANTAC) 150 MG tablet Take 1 tablet by mouth daily.  . sucralfate (CARAFATE) 1 GM/10ML suspension Take 10 mLs (1 g total) by mouth 4 (four) times daily -  with meals and at bedtime.  . [DISCONTINUED] amLODipine (NORVASC) 5 MG tablet Take 1 tablet (5 mg total) by mouth daily.    Allergies  Allergen Reactions  . Antihistamines, Chlorpheniramine-Type Palpitations    Makes heart beat fast    Social History   Social History  . Marital status: Married    Spouse name: N/A  . Number of children: N/A  . Years of education: N/A   Social History Main Topics  . Smoking status: Never Smoker  . Smokeless tobacco: Never Used  . Alcohol use No  . Drug use: No  . Sexual activity: Yes   Other Topics Concern  . None   Social History Narrative   Married with no children. Walks several times a week at least 3-4 times a week about 2 miles a time. Does not smoke and does not drink.    family history includes Arthritis in her mother; Cancer in her maternal grandmother;  Cancer - Lung in her brother and brother; Cancer - Prostate in her brother; Dementia in her mother and sister; Heart disease in her father and maternal grandfather; Hypertension in her brother, mother, and sister.  Wt Readings from Last 3 Encounters:  10/06/16 58.1 kg (128 lb 1.9 oz)  10/06/16 58.5 kg (129 lb)  07/19/16 61.2 kg (135 lb)    PHYSICAL EXAM BP (!) 142/84 (BP Location: Left Arm, Patient Position: Sitting, Cuff Size: Small)   Pulse 68   Ht _0  (1.626 m)   Wt 58.5 kg (129 lb)   BMI 22.14 kg/m  General appearance: alert, cooperative, appears stated age, no distress and Well-nourished, well-groomed. She looks younger than stated age. Neck: no adenopathy, no carotid bruit and no JVD Lungs: clear to auscultation bilaterally, normal percussion bilaterally and non-labored Heart: regular rate and rhythm, S1 & S2 normal, no murmur, click, rub or gallop; nondisplaced PMI Abdomen: soft, non-tender; bowel sounds normal; no masses,  no organomegaly; no HJR Extremities: extremities normal, atraumatic, no cyanosis, or  edema Pulses: 2+ and symmetric;  Skin: mobility and turgor normal and no evidence of bleeding or bruising or  Neurologic: Mental status: Alert, oriented, thought content appropriate   Adult ECG Report Not checked  Other studies Reviewed: Additional studies/ records that were reviewed today include:  Recent Labs:   Lab Results  Component Value Date   CHOL 230 (H) 08/29/2015   HDL 85 08/29/2015   LDLCALC 125 08/29/2015   TRIG 100 08/29/2015   CHOLHDL 2.7 08/29/2015    ASSESSMENT / PLAN: Problem List Items Addressed This Visit    Paroxysmal atrial fibrillation ([place]) (Chronic)    Usually well controlled. On flecainide 50 mg twice a day along with Toprol.  Plan for breakthrough: In addition to taking additional half dose of Toprol she will take when necessary 100 mg flecainide --followed by a second dose of 100 mg if symptoms not resolved after 1-2 hours. (Would  prefer not to take greater than 200 mg daily)  Converted DOAC - ELIQUIS without bleeding complication.      Relevant Medications   amLODipine (NORVASC) 10 MG tablet   Essential hypertension (Chronic)    Has been relatively well-controlled. Still borderline, but much improved with amlodipine added. Plan: Increase amlodipine to 10 mg daily.      Relevant Medications   amLODipine (NORVASC) 10 MG tablet   Long term current use of anticoagulant therapy (Chronic)    Now on ELIQUIS. No bleeding complication.      Palpitations (Chronic)    These symptoms seem to have been essentially eradicated with the use of flecainide.         Current medicines are reviewed at length with the patient today. (+/- concerns) not sure about how to take Flecainide PRN The following changes have been made: see below.  Patient Instructions  MAY  TAKE 100 MG OF FLECAINIDE IF YOU FEEL  ATRIAL FIBRILLATION  OCCURRING AND IF CONTINUE FOR FOR MORE THAN 1 TO 2 HOURS MAY TAKE ANOTHER 100 MG FLECAINIDE- ONLTY TAKE A TOTAL OF 200 MG IN 24 HOUR PERIOD.   INCREASE AMLODIPINE TO 10 MG ONCE DAILY     Your physician recommends that you schedule a follow-up appointment in April 2018 WITH CVRR- BLOOD PRESSURE   Your physician wants you to follow-up in [place]. You will receive a reminder letter in the mail two months in advance. If you don't receive a letter, please call our office to schedule the follow-up appointment.    Studies Ordered:   No orders of the defined types were placed in this encounter.     Glenetta Hew, M.D., M.S. Interventional Cardiologist   Pager # [phone] Phone # [phone] [address] [place],  [address]

## 2016-10-06 NOTE — Progress Notes (Signed)
Hematology and Oncology Follow Up Visit  TERRIYAH WESTRA [id number] [birth year] 76 y.o. 10/06/2016   Principle Diagnosis:   Ethnic associated leukopenia (EAL)  Current Therapy:    Observation     Interim History:  Ms. Rison is back for follow-up. I saw her initially on October 4. At that point in time, I thought that she had ethnic associated leukopenia. Her white cell count was minimally low.  Since her last saw her, she has been doing well. She has had no problems infections. She's got through the flu season okay.  She currently is working on a book about her family lineage. I find this very fascinating.  She had a nice Christmas and New Year's. She was with family.  She has had no rashes. She has had no leg swelling. She has had no change in bowel or bladder habits.  There's been no weight loss or weight gain. Her appetite has been good.  Overall, her performance status is ECOG 0.  Medications:  Current Outpatient Prescriptions:  .  alum & mag hydroxide-simeth (MAALOX ADVANCED) 200-200-20 MG/5ML suspension, Take 30 mLs by mouth every 6 (six) hours as needed for indigestion or heartburn., Disp: 355 mL, Rfl: 0 .  amLODipine (NORVASC) 10 MG tablet, Take 1 tablet (10 mg total) by mouth daily., Disp: 90 tablet, Rfl: 3 .  atorvastatin (LIPITOR) 10 MG tablet, TAKE ONE TABLET BY MOUTH ONCE DAILY WITH  LUNCH, Disp: 90 tablet, Rfl: 3 .  conjugated estrogens (PREMARIN) vaginal cream, Place 0.5 Applicatorfuls vaginally 2 (two) times a week., Disp: 42.5 g, Rfl: 1 .  ELIQUIS 5 MG TABS tablet, TAKE ONE TABLET BY MOUTH TWICE DAILY, Disp: 60 tablet, Rfl: 3 .  flecainide (TAMBOCOR) 50 MG tablet, Take 1 tablet (50 mg total) by mouth 2 (two) times daily., Disp: 180 tablet, Rfl: 3 .  fluticasone (FLONASE) 50 MCG/ACT nasal spray, Place 2 sprays into both nostrils daily., Disp: 16 g, Rfl: 11 .  lisinopril (PRINIVIL,ZESTRIL) 30 MG tablet, Take 0.5 tablets (15 mg total) by mouth daily., Disp: 90  tablet, Rfl: 1 .  metoprolol succinate (TOPROL-XL) 25 MG 24 hr tablet, Take 1 tablet (25 mg) daily before dinner, may take an additional 1/2 tablet (12.5 mg) 6 hours after as needed. (Patient taking differently: Take 25 mg by mouth daily. may take an additional 1/2 tablet (12.5 mg) 6 hours after as needed.), Disp: 90 tablet, Rfl: 3 .  Multiple Vitamin (MULTIVITAMIN WITH MINERALS) TABS tablet, Take 1 tablet by mouth daily with lunch. Centrum Silver, Disp: , Rfl:  .  omeprazole (PRILOSEC) 20 MG capsule, TAKE ONE CAPSULE ONCE DAILY, THEN TAKE ONE TWICE DAILY FOR 14 DAYS EVERY 4 MONTHS, Disp: 90 capsule, Rfl: 1 .  ranitidine (ZANTAC) 150 MG tablet, Take 1 tablet by mouth daily., Disp: , Rfl:  .  sucralfate (CARAFATE) 1 GM/10ML suspension, Take 10 mLs (1 g total) by mouth 4 (four) times daily -  with meals and at bedtime., Disp: 420 mL, Rfl: 0  Allergies:  Allergies  Allergen Reactions  . Antihistamines, Chlorpheniramine-Type Palpitations    Makes heart beat fast    Past Medical History, Surgical history, Social history, and Family History were reviewed and updated.  Review of Systems:  As above  Physical Exam:  weight is 128 lb 1.9 oz (58.1 kg). Her oral temperature is 98.4 F (36.9 C). Her blood pressure is 148/85 (abnormal) and her pulse is 72.   Wt Readings from Last 3 Encounters:  10/06/16 128 lb  1.9 oz (58.1 kg)  10/06/16 129 lb (58.5 kg)  07/19/16 135 lb (61.2 kg)     Head and neck exam shows no ocular or oral lesions. She has no palpable cervical or supraclavicular lymph nodes. Lungs are clear to percussion and auscultation bilaterally. Cardiac exam regular rate and rhythm with no murmurs, rubs or bruits. Abdomen is soft. She has good bowel sounds. There is no fluid wave. There is no palpable liver or spleen tip. Back exam shows no tenderness over the spine, ribs or hips. Extremities shows no clubbing, cyanosis or edema. Neurological exam shows no focal neurological deficits. Skin  exam shows no rashes, ecchymoses or petechia.   Lab Results  Component Value Date   WBC 4.1 10/06/2016   HGB 11.8 10/06/2016   HCT 35.9 10/06/2016   MCV 79 (L) 10/06/2016   PLT 233 10/06/2016     Chemistry      Component Value Date/Time   NA 138 07/19/2016 1855   K 3.1 (L) 07/19/2016 1855   CL 102 07/19/2016 1855   CO2 24 07/19/2016 1855   BUN 5 (L) 07/19/2016 1855   CREATININE 0.88 07/19/2016 1855   CREATININE 0.89 03/20/2016 1434      Component Value Date/Time   CALCIUM 9.6 07/19/2016 1855   ALKPHOS 47 03/20/2016 1434   AST 16 03/20/2016 1434   ALT 6 03/20/2016 1434   BILITOT 0.6 03/20/2016 1434         Impression and Plan: Ms. Mayford KnifeWilliams is a 76 year old African-American female with ethnic associated leukopenia. I looked at her blood smear today. I did not see anything that looked suspicious.  I don't see that we have to do a bone marrow test on her. I'm just not sure how much more we are really adding to her medical care.  I would like to see her back in 6 months. If all looks good in 6 months, then I suspect we can let her go from the clinic.   Josph MachoENNEVER,Cavion Faiola R, MD 2/5/20185:27 PM

## 2016-10-06 NOTE — Patient Instructions (Addendum)
MAY  TAKE 100 MG OF FLECAINIDE IF YOU FEEL  ATRIAL FIBRILLATION  OCCURRING AND IF CONTINUE FOR FOR MORE THAN 1 TO 2 HOURS MAY TAKE ANOTHER 100 MG FLECAINIDE- ONLTY TAKE A TOTAL OF 200 MG IN 24 HOUR PERIOD.   INCREASE AMLODIPINE TO 10 MG ONCE DAILY     Your physician recommends that you schedule a follow-up appointment in April 2018 WITH CVRR- BLOOD PRESSURE   Your physician wants you to follow-up in 12 MONTH DR [facility]ARD[facility]. You will receive a reminder letter in the mail two months in advance. If you don't receive a letter, please call our office to schedule the follow-up appointment.

## 2016-10-07 ENCOUNTER — Encounter: Payer: Self-pay | Admitting: Cardiology

## 2016-10-07 NOTE — Assessment & Plan Note (Signed)
Usually well controlled. On flecainide 50 mg twice a day along with Toprol.  Plan for breakthrough: In addition to taking additional half dose of Toprol she will take when necessary 100 mg flecainide --followed by a second dose of 100 mg if symptoms not resolved after 1-2 hours. (Would prefer not to take greater than 200 mg daily)  Converted DOAC - ELIQUIS without bleeding complication.

## 2016-10-07 NOTE — Assessment & Plan Note (Signed)
Now on ELIQUIS. No bleeding complication.

## 2016-10-07 NOTE — Assessment & Plan Note (Signed)
These symptoms seem to have been essentially eradicated with the use of flecainide.

## 2016-10-07 NOTE — Assessment & Plan Note (Signed)
Has been relatively well-controlled. Still borderline, but much improved with amlodipine added. Plan: Increase amlodipine to 10 mg daily.

## 2016-11-24 ENCOUNTER — Other Ambulatory Visit: Payer: Self-pay | Admitting: Cardiology

## 2016-11-25 NOTE — Telephone Encounter (Signed)
REFILL 

## 2016-12-11 ENCOUNTER — Ambulatory Visit (INDEPENDENT_AMBULATORY_CARE_PROVIDER_SITE_OTHER): Payer: Medicare Other | Admitting: Pharmacist

## 2016-12-11 ENCOUNTER — Telehealth: Payer: Self-pay | Admitting: *Deleted

## 2016-12-11 VITALS — BP 152/84 | HR 67

## 2016-12-11 DIAGNOSIS — I10 Essential (primary) hypertension: Secondary | ICD-10-CM | POA: Diagnosis not present

## 2016-12-11 NOTE — Progress Notes (Signed)
Patient ID: Donna Simmons                 DOB: 02-Dec-1940                      MRN: [id number]     HPI:  Donna Simmons is a 76 y.o. female patient of Dr Herbie Baltimore, with a PMH below who presents today for hypertension clinic evaluation.  She was at ER on Nov 17 and 18, for what she though was palpitations but may have actually been GERD.  At her last visit in with Dr Herbie Baltimore her blood pressure was 142/84 and amlodipine 5 mg was increased to .  She reports few episodes if lightheadedness while taking amlodipine . Today she reports home BP readings are much improved, in that all of her readings were 120-140 systolic.      Noted medication discrepancy while taking to patient. Patient was taking Lisinopril , metoprolol succinate  and amlodipine  while experiencing light-headed symptoms.  Now patient taking Lisinopril  daily, metoprolol succinate  and amlodipine  daily.   Blood Pressure Goal:  130/80    Current Medications:             Lisinopril 20 mg daily             Metoprolol succinate 25 mg daily             Amlodipine 5 mg daily  Cardiac Hx:             AF, hyperlipidemia, hypertension  Family Hx:             Father died from CHF at age 50, sister from MI at 83.     Social Hx:             No tobacco or alcohol, rare caffeine  Diet:             Eats mostly home cooked foods, does not add salt with cooking or at table, eats more fish and chicken, rare beef or pork  Exercise:             Walks 2 miles 3 times per week; goes to [facility] once weekly for [employer]  Home BP readings:             States home readings have improved greatly.  Consistently 120-150 systolic.  Home BP readings:  14 readings; 131/78 average (114-149/65-88)  Wt Readings from Last 3 Encounters:  10/06/16 128 lb 1.9 oz (58.1 kg)  10/06/16 129 lb (58.5 kg)  07/19/16 135 lb (61.2 kg)   BP Readings from Last 3 Encounters:  12/11/16 (!) 152/84  10/06/16 (!)  148/85  10/06/16 (!) 142/84   Pulse Readings from Last 3 Encounters:  12/11/16 67  10/06/16 72  10/06/16 68    Past Medical History:  Diagnosis Date  . Allergy   . GERD (gastroesophageal reflux disease)   . Heart murmur    No significant valvular lesion noted on echo.  . Hypercholesteremia   . Hypertension   . Palpitations 01/10/2009   14 day monitor- some sinus tachycardia, PVCs  . Paroxysmal atrial fibrillation (HCC) 12/27/2008    Current Outpatient Prescriptions on File Prior to Visit  Medication Sig Dispense Refill  . amLODipine (NORVASC) 10 MG tablet Take 1 tablet (10 mg total) by mouth daily. 90 tablet 3  . alum & mag hydroxide-simeth (MAALOX ADVANCED) 200-200-20 MG/5ML suspension Take 30 mLs by mouth  every 6 (six) hours as needed for indigestion or heartburn. 355 mL 0  . atorvastatin (LIPITOR) 10 MG tablet TAKE ONE TABLET BY MOUTH ONCE DAILY WITH  LUNCH 90 tablet 3  . conjugated estrogens (PREMARIN) vaginal cream Place 0.5 Applicatorfuls vaginally 2 (two) times a week. 42.5 g 1  . ELIQUIS 5 MG TABS tablet TAKE ONE TABLET BY MOUTH TWICE DAILY 60 tablet 3  . flecainide (TAMBOCOR) 50 MG tablet Take 1 tablet (50 mg total) by mouth 2 (two) times daily. 180 tablet 3  . fluticasone (FLONASE) 50 MCG/ACT nasal spray Place 2 sprays into both nostrils daily. 16 g 11  . metoprolol succinate (TOPROL-XL) 25 MG 24 hr tablet TAKE ONE TABLET BY MOUTH ONCE DAILY BEFORE  DINNER,  MAY  TAKE  AN  ADDITIONAL  ONE-HALF  TABLET  6  HOURS  AFTER  AS  NEEDED 135 tablet 3  . Multiple Vitamin (MULTIVITAMIN WITH MINERALS) TABS tablet Take 1 tablet by mouth daily with lunch. Centrum Silver    . omeprazole (PRILOSEC) 20 MG capsule TAKE ONE CAPSULE ONCE DAILY, THEN TAKE ONE TWICE DAILY FOR 14 DAYS EVERY 4 MONTHS 90 capsule 1  . ranitidine (ZANTAC) 150 MG tablet Take 1 tablet by mouth daily.    . sucralfate (CARAFATE) 1 GM/10ML suspension Take 10 mLs (1 g total) by mouth 4 (four) times daily -  with meals and  at bedtime. 420 mL 0   No current facility-administered medications on file prior to visit.     Allergies  Allergen Reactions  . Antihistamines, Chlorpheniramine-Type Palpitations    Makes heart beat fast    Blood pressure (!) 152/84, pulse 67, SpO2 98 %.   Essential hypertension:  Blood pressure today above goal of 130/80 and higher than previous office readings.  Patient currently not experiencing ADRs to current therapy. Denies headaches, dizziness, and fatigue.  Will increase amlodipine back to  daily. Continue Lisinopril  daily and metoprolol succinate  . Follow up with HNT clinic in 4 weeks   Xyler Terpening Rodriguez-Guzman PharmD, BCPS [facility] [address] [place] [address] 12/11/2016 8:29 PM

## 2016-12-11 NOTE — Patient Instructions (Signed)
Return for a a follow up appointment in 4 weeks  Your blood pressure today is 152/84 pulse 67  Check your blood pressure at home daily (if able) and keep record of the readings.  Take your BP meds as follows:  Lisinopril 20 mg daily  Metoprolol succinate 25 mg daily  ** Resume Amlodipine  daily**  Bring all of your meds, your BP cuff and your record of home blood pressures to your next appointment.  Exercise as you're able, try to walk approximately 30 minutes per day.  Keep salt intake to a minimum, especially watch canned and prepared boxed foods.  Eat more fresh fruits and vegetables and fewer canned items.  Avoid eating in fast food restaurants.    HOW TO TAKE YOUR BLOOD PRESSURE: . Rest 5 minutes before taking your blood pressure. .  Don't smoke or drink caffeinated beverages for at least 30 minutes before. . Take your blood pressure before (not after) you eat. . Sit comfortably with your back supported and both feet on the floor (don't cross your legs). . Elevate your arm to heart level on a table or a desk. . Use the proper sized cuff. It should fit smoothly and snugly around your bare upper arm. There should be enough room to slip a fingertip under the cuff. The bottom edge of the cuff should be 1 inch above the crease of the elbow. . Ideally, take 3 measurements at one sitting and record the average.

## 2016-12-11 NOTE — Telephone Encounter (Signed)
Okay for surgery. No need to change medications.  Bryan Lemma, MD

## 2016-12-11 NOTE — Telephone Encounter (Signed)
DEFER  TO DR [facility]  LAST CARDIAC VISIT 02/205/2018    Request for surgical clearance:  1. What type of surgery is being performed? CATARACT EXTRACTION W/INTRAOCULAR LENS IMPLANTATION EYES     2. When is this surgery scheduled? DATE 12/29/2016   AND 01/19/2017  3. Are there any medications that need to be held prior to surgery and how long? PATIENT  DOES NOT NEED TO STOP ANY MEDICATION. TOPICAL ANESTHESIA ONLY  4. Name of physician performing surgery? DR TIMOTHY BEVIS   5. Is patient medically stable to proceed with eye procedure?  6. What is your office phone and fax number? Phone [phone]  Attn michelle  Fax [phone]  ( NEED CURRENT MEDICATION LIST , DX)

## 2016-12-12 NOTE — Telephone Encounter (Signed)
FAXED PAPER CLEARANCE  FORM TO OFFICE WITH COPY OF MEDICATION LIST

## 2016-12-22 ENCOUNTER — Ambulatory Visit (INDEPENDENT_AMBULATORY_CARE_PROVIDER_SITE_OTHER): Payer: Medicare Other | Admitting: Family Medicine

## 2016-12-22 VITALS — BP 157/80 | HR 71 | Temp 98.0°F | Resp 16 | Ht 64.0 in | Wt 126.4 lb

## 2016-12-22 DIAGNOSIS — I10 Essential (primary) hypertension: Secondary | ICD-10-CM | POA: Diagnosis not present

## 2016-12-22 DIAGNOSIS — I480 Paroxysmal atrial fibrillation: Secondary | ICD-10-CM

## 2016-12-22 DIAGNOSIS — R12 Heartburn: Secondary | ICD-10-CM | POA: Diagnosis not present

## 2016-12-22 DIAGNOSIS — R002 Palpitations: Secondary | ICD-10-CM | POA: Diagnosis not present

## 2016-12-22 NOTE — Progress Notes (Signed)
By signing my name below, I, Mesha Guinyard, attest that this documentation has been prepared under the direction and in the presence of Meredith Staggers, MD.  Electronically Signed: Arvilla Market, Medical Scribe. 12/22/16. 1:59 PM.  Subjective:    Patient ID: Donna Simmons, female    DOB: May 21, 1941, 76 y.o.   MRN: [id number]  HPI Chief Complaint  Patient presents with  . Hypertension    question about medication    HPI Comments: Donna Simmons is a 76 y.o. female who presents to the Primary Care at [facility] and [facility] complaining of palpitations onset yesterday. Donna Simmons has a PMHx of HTN, and A. Fib; treated by  Dr. Herbie Baltimore. Last office visit 10/06/16. Was seen by pharmacist 12/11/16 at [facility]. Planned follow-up in 4 weeks. She takes flecainide for rhythm control and anticoagulated with eliquis. Was advised in Feb if having breakthrough sxs, can take addiation dose of flecainide; maximum 200 mg in 24 hour period. Her amlodipine was increased for bp at Feb visit. She also takes lisinopril 20 mg and toprolol 25 mg QD. Previous cardiology notes were reviewed.  Reports palpitations yesterday and the rhythm was "out of sync". Pt has appt with cardiologist next week, but plans on driving to [facility] this weekend. Described as "2 beats and a space" for 15 mins and didn't find relief after taking flecainide for her sxs. She has not experienced these sxs today but it has occurred in the past. Pt reports her PMHx of GERD triggers her palpitations and she will treat it with prilosec BID for 2 weeks, every "4th month". Denies chest pain, light-headedness, dizziness, weakness, chest tightness, sleep disturbance, SOB, anxiety, life stressors, and drinking caffeine that day.  Patient Active Problem List   Diagnosis Date Noted  . Dysphagia 11/05/2015  . Long term current use of anticoagulant therapy 11/18/2012  . Hypercholesteremia 09/25/2012  . DDD (degenerative disc disease),  cervical 05/19/2012  . Paroxysmal atrial fibrillation (HCC) 04/20/2012  . Essential hypertension 04/20/2012  . Palpitations 01/10/2009   Past Medical History:  Diagnosis Date  . Allergy   . GERD (gastroesophageal reflux disease)   . Heart murmur    No significant valvular lesion noted on echo.  . Hypercholesteremia   . Hypertension   . Palpitations 01/10/2009   14 day monitor- some sinus tachycardia, PVCs  . Paroxysmal atrial fibrillation (HCC) 12/27/2008   Past Surgical History:  Procedure Laterality Date  . ABDOMINAL HYSTERECTOMY  1980  . BREAST SURGERY    . CARDIAC CATHETERIZATION  12/16/2010   no evidence of CAD to explain anginal pain w/ positive troponin.  potential etiology is breakthrough AF  . FRACTURE SURGERY    . NM MYOVIEW LTD  April 2010   EF 64%, normal pattern of perfusion in all regions, no scintigraphic evidence of inducible ischemia; no significant wall motion abnormalities; EKG negative for ischemia; no significant change from last study; low risk scan  . TRANSTHORACIC ECHOCARDIOGRAM  April 2010   Echo - EF >55; RV mils/mod dilated, RV systolic function mildly reduced; RA mild/moderately dilated; moderate pulmonary hypertension; mild tricuspid regurgitation   Allergies  Allergen Reactions  . Antihistamines, Chlorpheniramine-Type Palpitations    Makes heart beat fast   Prior to Admission medications   Medication Sig Start Date End Date Taking? Authorizing Provider  alum & mag hydroxide-simeth (MAALOX ADVANCED) 200-200-20 MG/5ML suspension Take 30 mLs by mouth every 6 (six) hours as needed for indigestion or heartburn. 07/19/16  Yes Sheria Lang  Erma Heritage, MD  amLODipine (NORVASC) 10 MG tablet Take 1 tablet (10 mg total) by mouth daily. 10/06/16 01/04/17 Yes Lewayne Bunting, MD  atorvastatin (LIPITOR) 10 MG tablet TAKE ONE TABLET BY MOUTH ONCE DAILY WITH  LUNCH 06/02/16  Yes Marykay Lex, MD  conjugated estrogens (PREMARIN) vaginal cream Place 0.5 Applicatorfuls vaginally  2 (two) times a week. 12/20/15  Yes Linna Hoff, MD  ELIQUIS 5 MG TABS tablet TAKE ONE TABLET BY MOUTH TWICE DAILY 09/24/16  Yes Marykay Lex, MD  flecainide (TAMBOCOR) 50 MG tablet Take 1 tablet (50 mg total) by mouth 2 (two) times daily. 04/04/16  Yes Ok Anis, NP  fluticasone (FLONASE) 50 MCG/ACT nasal spray Place 2 sprays into both nostrils daily. 12/07/15  Yes Ofilia Neas, PA-C  lisinopril (PRINIVIL,ZESTRIL) 20 MG tablet Take 20 mg by mouth daily.   Yes Historical Provider, MD  metoprolol succinate (TOPROL-XL) 25 MG 24 hr tablet TAKE ONE TABLET BY MOUTH ONCE DAILY BEFORE  DINNER,  MAY  TAKE  AN  ADDITIONAL  ONE-HALF  TABLET  6  HOURS  AFTER  AS  NEEDED 11/25/16  Yes Marykay Lex, MD  Multiple Vitamin (MULTIVITAMIN WITH MINERALS) TABS tablet Take 1 tablet by mouth daily with lunch. Centrum Silver   Yes Historical Provider, MD  omeprazole (PRILOSEC) 20 MG capsule TAKE ONE CAPSULE ONCE DAILY, THEN TAKE ONE TWICE DAILY FOR 14 DAYS EVERY 4 MONTHS 07/22/16  Yes [name], MD  ranitidine (ZANTAC) 150 MG tablet Take 1 tablet by mouth daily. 07/23/16  Yes Historical Provider, MD  sucralfate (CARAFATE) 1 GM/10ML suspension Take 10 mLs (1 g total) by mouth 4 (four) times daily -  with meals and at bedtime. 06/03/16  Yes [name], MD   Social History   Social History  . Marital status: Married    Spouse name: N/A  . Number of children: N/A  . Years of education: N/A   Occupational History  . Not on file.   Social History Main Topics  . Smoking status: Never Smoker  . Smokeless tobacco: Never Used  . Alcohol use No  . Drug use: No  . Sexual activity: Yes   Other Topics Concern  . Not on file   Social History Narrative   Married with no children. Walks several times a week at least 3-4 times a week about 2 miles a time. Does not smoke and does not drink.   Review of Systems  Respiratory: Negative for chest tightness and shortness of breath.   Cardiovascular:  Positive for palpitations. Negative for chest pain.  Neurological: Negative for dizziness, weakness and light-headedness.  Psychiatric/Behavioral: Negative for sleep disturbance. The patient is not nervous/anxious.     Objective:  Physical Exam  Constitutional: She appears well-developed and well-nourished. No distress.  HENT:  Head: Normocephalic and atraumatic.  Eyes: Conjunctivae are normal.  Neck: Neck supple.  Cardiovascular: Normal rate, regular rhythm and normal heart sounds.  Exam reveals no gallop and no friction rub.   No murmur heard. Pulmonary/Chest: Effort normal. No respiratory distress. She has no wheezes. She has no rales.  Neurological: She is alert.  Skin: Skin is warm and dry.  Psychiatric: She has a normal mood and affect. Her behavior is normal.  Nursing note and vitals reviewed.   Vitals:   12/22/16 1332  BP: (!) 157/80  Pulse: 71  Resp: 16  Temp: 98 F (36.7 C)  TempSrc: Oral  SpO2: 98%  Weight:  126 lb 6.4 oz (57.3 kg)  Height:  (1.626 m)  Body mass index is 21.7 kg/m.   [EKG Reading]: Sinus bradycardia. Rate 59. Flat T-wave in lead III. No acute findings or changes from previous EKG otherwise. Assessment & Plan:   Donna Simmons is a 76 y.o. female Paroxysmal atrial fibrillation (HCC) Palpitations - Plan: EKG 12-Lead  - History of paroxysmal atrial fibrillation, suspected flare yesterday, lasting 15 minutes, but did take flecainide as instructed by cardiology. Asymptomatic today. No apparent acute findings or changes on EKG today, and in sinus rhythm. Slightly elevated blood pressure, but otherwise reassuring exam and vital signs.  -Continue plan as developed with cardiology with flecainide including additional dose if needed up to a max dose of 200 mg daily.  -Continue Toprol, continue lisinopril, continue Eliquis for anticoagulation. ER/RTC precautions discussed including if associated symptoms with her palpitations as noted on after visit  summary.  Essential hypertension - Plan: Care order/instruction:  - Mildly elevated in office. Continue same regimen, can monitor outside of visit, and follow-up with cardiology as planned next week.  Heartburn  -Common associated foods were given on handout, trigger avoidance discussed.. Continue PPI as instructed by her gastroenterologist.   No orders of the defined types were placed in this encounter.  Patient Instructions    See information below on foods to avoid with GERD. Continue omeprazole as prescribed by Dr. Elnoria Howard.   Follow up with cardiologist as planned next week and continue plan of extra flecainide as discussed with your cardiologist if breakthrough symptoms.  If chest pains, shortness of breath, lightheadedness with your symptoms, would recommend seeking care immediately. Otherwise keep follow-up as planned with cardiology.  Atrial Fibrillation Atrial fibrillation is a type of irregular or rapid heartbeat (arrhythmia). In atrial fibrillation, the heart quivers continuously in a chaotic pattern. This occurs when parts of the heart receive disorganized signals that make the heart unable to pump blood normally. This can increase the risk for stroke, heart failure, and other heart-related conditions. There are different types of atrial fibrillation, including:  Paroxysmal atrial fibrillation. This type starts suddenly, and it usually stops on its own shortly after it starts.  Persistent atrial fibrillation. This type often lasts longer than a week. It may stop on its own or with treatment.  Long-lasting persistent atrial fibrillation. This type lasts longer than 12 months.  Permanent atrial fibrillation. This type does not go away. Talk with your health care provider to learn about the type of atrial fibrillation that you have. What are the causes? This condition is caused by some heart-related conditions or procedures, including:  A heart attack.  Coronary artery  disease.  Heart failure.  Heart valve conditions.  High blood pressure.  Inflammation of the sac that surrounds the heart (pericarditis).  Heart surgery.  Certain heart rhythm disorders, such as Wolf-Parkinson-White syndrome. Other causes include:  Pneumonia.  Obstructive sleep apnea.  Blockage of an artery in the lungs (pulmonary embolism, or PE).  Lung cancer.  Chronic lung disease.  Thyroid problems, especially if the thyroid is overactive (hyperthyroidism).  Caffeine.  Excessive alcohol use or illegal drug use.  Use of some medicines, including certain decongestants and diet pills. Sometimes, the cause cannot be found. What increases the risk? This condition is more likely to develop in:  People who are older in age.  People who smoke.  People who have diabetes mellitus.  People who are overweight (obese).  Athletes who exercise vigorously. What are the signs  or symptoms? Symptoms of this condition include:  A feeling that your heart is beating rapidly or irregularly.  A feeling of discomfort or pain in your chest.  Shortness of breath.  Sudden light-headedness or weakness.  Getting tired easily during exercise. In some cases, there are no symptoms. How is this diagnosed? Your health care provider may be able to detect atrial fibrillation when taking your pulse. If detected, this condition may be diagnosed with:  An electrocardiogram (ECG).  A Holter monitor test that records your heartbeat patterns over a 24-hour period.  Transthoracic echocardiogram (TTE) to evaluate how blood flows through your heart.  Transesophageal echocardiogram (TEE) to view more detailed images of your heart.  A stress test.  Imaging tests, such as a CT scan or chest X-ray.  Blood tests. How is this treated? The main goals of treatment are to prevent blood clots from forming and to keep your heart beating at a normal rate and rhythm. The type of treatment that  you receive depends on many factors, such as your underlying medical conditions and how you feel when you are experiencing atrial fibrillation. This condition may be treated with:  Medicine to slow down the heart rate, bring the heart's rhythm back to normal, or prevent clots from forming.  Electrical cardioversion. This is a procedure that resets your heart's rhythm by delivering a controlled, low-energy shock to the heart through your skin.  Different types of ablation, such as catheter ablation, catheter ablation with pacemaker, or surgical ablation. These procedures destroy the heart tissues that send abnormal signals. When the pacemaker is used, it is placed under your skin to help your heart beat in a regular rhythm. Follow these instructions at home:  Take over-the counter and prescription medicines only as told by your health care provider.  If your health care provider prescribed a blood-thinning medicine (anticoagulant), take it exactly as told. Taking too much blood-thinning medicine can cause bleeding. If you do not take enough blood-thinning medicine, you will not have the protection that you need against stroke and other problems.  Do not use tobacco products, including cigarettes, chewing tobacco, and e-cigarettes. If you need help quitting, ask your health care provider.  If you have obstructive sleep apnea, manage your condition as told by your health care provider.  Do not drink alcohol.  Do not drink beverages that contain caffeine, such as coffee, soda, and tea.  Maintain a healthy weight. Do not use diet pills unless your health care provider approves. Diet pills may make heart problems worse.  Follow diet instructions as told by your health care provider.  Exercise regularly as told by your health care provider.  Keep all follow-up visits as told by your health care provider. This is important. How is this prevented?  Avoid drinking beverages that contain caffeine  or alcohol.  Avoid certain medicines, especially medicines that are used for breathing problems.  Avoid certain herbs and herbal medicines, such as those that contain ephedra or ginseng.  Do not use illegal drugs, such as cocaine and amphetamines.  Do not smoke.  Manage your high blood pressure. Contact a health care provider if:  You notice a change in the rate, rhythm, or strength of your heartbeat.  You are taking an anticoagulant and you notice increased bruising.  You tire more easily when you exercise or exert yourself. Get help right away if:  You have chest pain, abdominal pain, sweating, or weakness.  You feel nauseous.  You notice blood in  your vomit, bowel movement, or urine.  You have shortness of breath.  You suddenly have swollen feet and ankles.  You feel dizzy.  You have sudden weakness or numbness of the face, arm, or leg, especially on one side of the body.  You have trouble speaking, trouble understanding, or both (aphasia).  Your face or your eyelid droops on one side. These symptoms may represent a serious problem that is an emergency. Do not wait to see if the symptoms will go away. Get medical help right away. Call your local emergency services (911 in the [place] Do not drive yourself to the hospital. This information is not intended to replace advice given to you by your health care provider. Make sure you discuss any questions you have with your health care provider. Document Released: 08/18/2005 Document Revised: 12/26/2015 Document Reviewed: 12/13/2014 Elsevier Interactive Patient Education  2017 [employer].   Palpitations A palpitation is the feeling that your heartbeat is irregular or is faster than normal. It may feel like your heart is fluttering or skipping a beat. Palpitations are usually not a serious problem. They may be caused by many things, including smoking, caffeine, alcohol, stress, and certain medicines. Although most causes of  palpitations are not serious, palpitations can be a sign of a serious medical problem. In some cases, you may need further medical evaluation. Follow these instructions at home: Pay attention to any changes in your symptoms. Take these actions to help with your condition:  Avoid the following:  Caffeinated coffee, tea, soft drinks, diet pills, and energy drinks.  Chocolate.  Alcohol.  Do not use any tobacco products, such as cigarettes, chewing tobacco, and e-cigarettes. If you need help quitting, ask your health care provider.  Try to reduce your stress and anxiety. Things that can help you relax include:  Yoga.  Meditation.  Physical activity, such as swimming, jogging, or walking.  Biofeedback. This is a method that helps you learn to use your mind to control things in your body, such as your heartbeats.  Get plenty of rest and sleep.  Take over-the-counter and prescription medicines only as told by your health care provider.  Keep all follow-up visits as told by your health care provider. This is important. Contact a health care provider if:  You continue to have a fast or irregular heartbeat after 24 hours.  Your palpitations occur more often. Get help right away if:  You have chest pain or shortness of breath.  You have a severe headache.  You feel dizzy or you faint. This information is not intended to replace advice given to you by your health care provider. Make sure you discuss any questions you have with your health care provider. Document Released: 08/15/2000 Document Revised: 01/21/2016 Document Reviewed: 05/03/2015 Elsevier Interactive Patient Education  2017 [employer].   Food Choices for Gastroesophageal Reflux Disease, Adult When you have gastroesophageal reflux disease (GERD), the foods you eat and your eating habits are very important. Choosing the right foods can help ease the discomfort of GERD. Consider working with a diet and nutrition  specialist (dietitian) to help you make healthy food choices. What general guidelines should I follow? Eating plan   Choose healthy foods low in fat, such as fruits, vegetables, whole grains, low-fat dairy products, and lean meat, fish, and poultry.  Eat frequent, small meals instead of three large meals each day. Eat your meals slowly, in a relaxed setting. Avoid bending over or lying down until 2-3 hours after eating.  Limit high-fat foods such as fatty meats or fried foods.  Limit your intake of oils, butter, and shortening to less than 8 teaspoons each day.  Avoid the following:  Foods that cause symptoms. These may be different for different people. Keep a food diary to keep track of foods that cause symptoms.  Alcohol.  Drinking large amounts of liquid with meals.  Eating meals during the 2-3 hours before bed.  Cook foods using methods other than frying. This may include baking, grilling, or broiling. Lifestyle    Maintain a healthy weight. Ask your health care provider what weight is healthy for you. If you need to lose weight, work with your health care provider to do so safely.  Exercise for at least 30 minutes on 5 or more days each week, or as told by your health care provider.  Avoid wearing clothes that fit tightly around your waist and chest.  Do not use any products that contain nicotine or tobacco, such as cigarettes and e-cigarettes. If you need help quitting, ask your health care provider.  Sleep with the head of your bed raised. Use a wedge under the mattress or blocks under the bed frame to raise the head of the bed. What foods are not recommended? The items listed may not be a complete list. Talk with your dietitian about what dietary choices are best for you. Grains  Pastries or quick breads with added fat. [place] toast. Vegetables  Deep fried vegetables. [place] fries. Any vegetables prepared with added fat. Any vegetables that cause symptoms. For some  people this may include tomatoes and tomato products, chili peppers, onions and garlic, and horseradish. Fruits  Any fruits prepared with added fat. Any fruits that cause symptoms. For some people this may include citrus fruits, such as oranges, grapefruit, pineapple, and lemons. Meats and other protein foods  High-fat meats, such as fatty beef or pork, hot dogs, ribs, ham, sausage, salami and bacon. Fried meat or protein, including fried fish and fried chicken. Nuts and nut butters. Dairy  Whole milk and chocolate milk. Sour cream. Cream. Ice cream. Cream cheese. Milk shakes. Beverages  Coffee and tea, with or without caffeine. Carbonated beverages. Sodas. Energy drinks. Fruit juice made with acidic fruits (such as orange or grapefruit). Tomato juice. Alcoholic drinks. Fats and oils  Butter. Margarine. Shortening. Ghee. Sweets and desserts  Chocolate and cocoa. Donuts. Seasoning and other foods  Pepper. Peppermint and spearmint. Any condiments, herbs, or seasonings that cause symptoms. For some people, this may include curry, hot sauce, or vinegar-based salad dressings. Summary  When you have gastroesophageal reflux disease (GERD), food and lifestyle choices are very important to help ease the discomfort of GERD.  Eat frequent, small meals instead of three large meals each day. Eat your meals slowly, in a relaxed setting. Avoid bending over or lying down until 2-3 hours after eating.  Limit high-fat foods such as fatty meat or fried foods. This information is not intended to replace advice given to you by your health care provider. Make sure you discuss any questions you have with your health care provider. Document Released: 08/18/2005 Document Revised: 08/19/2016 Document Reviewed: 08/19/2016 Elsevier Interactive Patient Education  2017 [employer].   IF you received an x-ray today, you will receive an invoice from [facility]. Please contact [facility] at  [phone] with questions or concerns regarding your invoice.   IF you received labwork today, you will receive an invoice from [facility]. Please contact [facility] at [phone] with  questions or concerns regarding your invoice.   Our billing staff will not be able to assist you with questions regarding bills from these companies.  You will be contacted with the lab results as soon as they are available. The fastest way to get your results is to activate your My Chart account. Instructions are located on the last page of this paperwork. If you have not heard from [place] regarding the results in 2 weeks, please contact this office.      I personally performed the services described in this documentation, which was scribed in my presence. The recorded information has been reviewed and considered for accuracy and completeness, addended by me as needed, and agree with information above.  Signed,   Meredith Staggers, MD Primary Care at [facility].  12/22/16 2:50 PM

## 2016-12-22 NOTE — Patient Instructions (Addendum)
See information below on foods to avoid with GERD. Continue omeprazole as prescribed by Dr. Elnoria Howard.   Follow up with cardiologist as planned next week and continue plan of extra flecainide as discussed with your cardiologist if breakthrough symptoms.  If chest pains, shortness of breath, lightheadedness with your symptoms, would recommend seeking care immediately. Otherwise keep follow-up as planned with cardiology.  Atrial Fibrillation Atrial fibrillation is a type of irregular or rapid heartbeat (arrhythmia). In atrial fibrillation, the heart quivers continuously in a chaotic pattern. This occurs when parts of the heart receive disorganized signals that make the heart unable to pump blood normally. This can increase the risk for stroke, heart failure, and other heart-related conditions. There are different types of atrial fibrillation, including:  Paroxysmal atrial fibrillation. This type starts suddenly, and it usually stops on its own shortly after it starts.  Persistent atrial fibrillation. This type often lasts longer than a week. It may stop on its own or with treatment.  Long-lasting persistent atrial fibrillation. This type lasts longer than 12 months.  Permanent atrial fibrillation. This type does not go away. Talk with your health care provider to learn about the type of atrial fibrillation that you have. What are the causes? This condition is caused by some heart-related conditions or procedures, including:  A heart attack.  Coronary artery disease.  Heart failure.  Heart valve conditions.  High blood pressure.  Inflammation of the sac that surrounds the heart (pericarditis).  Heart surgery.  Certain heart rhythm disorders, such as Wolf-Parkinson-White syndrome. Other causes include:  Pneumonia.  Obstructive sleep apnea.  Blockage of an artery in the lungs (pulmonary embolism, or PE).  Lung cancer.  Chronic lung disease.  Thyroid problems, especially if the  thyroid is overactive (hyperthyroidism).  Caffeine.  Excessive alcohol use or illegal drug use.  Use of some medicines, including certain decongestants and diet pills. Sometimes, the cause cannot be found. What increases the risk? This condition is more likely to develop in:  People who are older in age.  People who smoke.  People who have diabetes mellitus.  People who are overweight (obese).  Athletes who exercise vigorously. What are the signs or symptoms? Symptoms of this condition include:  A feeling that your heart is beating rapidly or irregularly.  A feeling of discomfort or pain in your chest.  Shortness of breath.  Sudden light-headedness or weakness.  Getting tired easily during exercise. In some cases, there are no symptoms. How is this diagnosed? Your health care provider may be able to detect atrial fibrillation when taking your pulse. If detected, this condition may be diagnosed with:  An electrocardiogram (ECG).  A Holter monitor test that records your heartbeat patterns over a 24-hour period.  Transthoracic echocardiogram (TTE) to evaluate how blood flows through your heart.  Transesophageal echocardiogram (TEE) to view more detailed images of your heart.  A stress test.  Imaging tests, such as a CT scan or chest X-ray.  Blood tests. How is this treated? The main goals of treatment are to prevent blood clots from forming and to keep your heart beating at a normal rate and rhythm. The type of treatment that you receive depends on many factors, such as your underlying medical conditions and how you feel when you are experiencing atrial fibrillation. This condition may be treated with:  Medicine to slow down the heart rate, bring the heart's rhythm back to normal, or prevent clots from forming.  Electrical cardioversion. This is a procedure that resets  your heart's rhythm by delivering a controlled, low-energy shock to the heart through your  skin.  Different types of ablation, such as catheter ablation, catheter ablation with pacemaker, or surgical ablation. These procedures destroy the heart tissues that send abnormal signals. When the pacemaker is used, it is placed under your skin to help your heart beat in a regular rhythm. Follow these instructions at home:  Take over-the counter and prescription medicines only as told by your health care provider.  If your health care provider prescribed a blood-thinning medicine (anticoagulant), take it exactly as told. Taking too much blood-thinning medicine can cause bleeding. If you do not take enough blood-thinning medicine, you will not have the protection that you need against stroke and other problems.  Do not use tobacco products, including cigarettes, chewing tobacco, and e-cigarettes. If you need help quitting, ask your health care provider.  If you have obstructive sleep apnea, manage your condition as told by your health care provider.  Do not drink alcohol.  Do not drink beverages that contain caffeine, such as coffee, soda, and tea.  Maintain a healthy weight. Do not use diet pills unless your health care provider approves. Diet pills may make heart problems worse.  Follow diet instructions as told by your health care provider.  Exercise regularly as told by your health care provider.  Keep all follow-up visits as told by your health care provider. This is important. How is this prevented?  Avoid drinking beverages that contain caffeine or alcohol.  Avoid certain medicines, especially medicines that are used for breathing problems.  Avoid certain herbs and herbal medicines, such as those that contain ephedra or ginseng.  Do not use illegal drugs, such as cocaine and amphetamines.  Do not smoke.  Manage your high blood pressure. Contact a health care provider if:  You notice a change in the rate, rhythm, or strength of your heartbeat.  You are taking an  anticoagulant and you notice increased bruising.  You tire more easily when you exercise or exert yourself. Get help right away if:  You have chest pain, abdominal pain, sweating, or weakness.  You feel nauseous.  You notice blood in your vomit, bowel movement, or urine.  You have shortness of breath.  You suddenly have swollen feet and ankles.  You feel dizzy.  You have sudden weakness or numbness of the face, arm, or leg, especially on one side of the body.  You have trouble speaking, trouble understanding, or both (aphasia).  Your face or your eyelid droops on one side. These symptoms may represent a serious problem that is an emergency. Do not wait to see if the symptoms will go away. Get medical help right away. Call your local emergency services (911 in the [place] Do not drive yourself to the hospital. This information is not intended to replace advice given to you by your health care provider. Make sure you discuss any questions you have with your health care provider. Document Released: 08/18/2005 Document Revised: 12/26/2015 Document Reviewed: 12/13/2014 Elsevier Interactive Patient Education  2017 [employer].   Palpitations A palpitation is the feeling that your heartbeat is irregular or is faster than normal. It may feel like your heart is fluttering or skipping a beat. Palpitations are usually not a serious problem. They may be caused by many things, including smoking, caffeine, alcohol, stress, and certain medicines. Although most causes of palpitations are not serious, palpitations can be a sign of a serious medical problem. In some cases, you  may need further medical evaluation. Follow these instructions at home: Pay attention to any changes in your symptoms. Take these actions to help with your condition:  Avoid the following:  Caffeinated coffee, tea, soft drinks, diet pills, and energy drinks.  Chocolate.  Alcohol.  Do not use any tobacco products, such  as cigarettes, chewing tobacco, and e-cigarettes. If you need help quitting, ask your health care provider.  Try to reduce your stress and anxiety. Things that can help you relax include:  Yoga.  Meditation.  Physical activity, such as swimming, jogging, or walking.  Biofeedback. This is a method that helps you learn to use your mind to control things in your body, such as your heartbeats.  Get plenty of rest and sleep.  Take over-the-counter and prescription medicines only as told by your health care provider.  Keep all follow-up visits as told by your health care provider. This is important. Contact a health care provider if:  You continue to have a fast or irregular heartbeat after 24 hours.  Your palpitations occur more often. Get help right away if:  You have chest pain or shortness of breath.  You have a severe headache.  You feel dizzy or you faint. This information is not intended to replace advice given to you by your health care provider. Make sure you discuss any questions you have with your health care provider. Document Released: 08/15/2000 Document Revised: 01/21/2016 Document Reviewed: 05/03/2015 Elsevier Interactive Patient Education  2017 [employer].   Food Choices for Gastroesophageal Reflux Disease, Adult When you have gastroesophageal reflux disease (GERD), the foods you eat and your eating habits are very important. Choosing the right foods can help ease the discomfort of GERD. Consider working with a diet and nutrition specialist (dietitian) to help you make healthy food choices. What general guidelines should I follow? Eating plan   Choose healthy foods low in fat, such as fruits, vegetables, whole grains, low-fat dairy products, and lean meat, fish, and poultry.  Eat frequent, small meals instead of three large meals each day. Eat your meals slowly, in a relaxed setting. Avoid bending over or lying down until 2-3 hours after eating.  Limit  high-fat foods such as fatty meats or fried foods.  Limit your intake of oils, butter, and shortening to less than 8 teaspoons each day.  Avoid the following:  Foods that cause symptoms. These may be different for different people. Keep a food diary to keep track of foods that cause symptoms.  Alcohol.  Drinking large amounts of liquid with meals.  Eating meals during the 2-3 hours before bed.  Cook foods using methods other than frying. This may include baking, grilling, or broiling. Lifestyle    Maintain a healthy weight. Ask your health care provider what weight is healthy for you. If you need to lose weight, work with your health care provider to do so safely.  Exercise for at least 30 minutes on 5 or more days each week, or as told by your health care provider.  Avoid wearing clothes that fit tightly around your waist and chest.  Do not use any products that contain nicotine or tobacco, such as cigarettes and e-cigarettes. If you need help quitting, ask your health care provider.  Sleep with the head of your bed raised. Use a wedge under the mattress or blocks under the bed frame to raise the head of the bed. What foods are not recommended? The items listed may not be a complete list. Talk  with your dietitian about what dietary choices are best for you. Grains  Pastries or quick breads with added fat. [place] toast. Vegetables  Deep fried vegetables. [place] fries. Any vegetables prepared with added fat. Any vegetables that cause symptoms. For some people this may include tomatoes and tomato products, chili peppers, onions and garlic, and horseradish. Fruits  Any fruits prepared with added fat. Any fruits that cause symptoms. For some people this may include citrus fruits, such as oranges, grapefruit, pineapple, and lemons. Meats and other protein foods  High-fat meats, such as fatty beef or pork, hot dogs, ribs, ham, sausage, salami and bacon. Fried meat or protein, including  fried fish and fried chicken. Nuts and nut butters. Dairy  Whole milk and chocolate milk. Sour cream. Cream. Ice cream. Cream cheese. Milk shakes. Beverages  Coffee and tea, with or without caffeine. Carbonated beverages. Sodas. Energy drinks. Fruit juice made with acidic fruits (such as orange or grapefruit). Tomato juice. Alcoholic drinks. Fats and oils  Butter. Margarine. Shortening. Ghee. Sweets and desserts  Chocolate and cocoa. Donuts. Seasoning and other foods  Pepper. Peppermint and spearmint. Any condiments, herbs, or seasonings that cause symptoms. For some people, this may include curry, hot sauce, or vinegar-based salad dressings. Summary  When you have gastroesophageal reflux disease (GERD), food and lifestyle choices are very important to help ease the discomfort of GERD.  Eat frequent, small meals instead of three large meals each day. Eat your meals slowly, in a relaxed setting. Avoid bending over or lying down until 2-3 hours after eating.  Limit high-fat foods such as fatty meat or fried foods. This information is not intended to replace advice given to you by your health care provider. Make sure you discuss any questions you have with your health care provider. Document Released: 08/18/2005 Document Revised: 08/19/2016 Document Reviewed: 08/19/2016 Elsevier Interactive Patient Education  2017 [employer].   IF you received an x-ray today, you will receive an invoice from [facility]. Please contact [facility] at [phone] with questions or concerns regarding your invoice.   IF you received labwork today, you will receive an invoice from [facility]. Please contact [facility] at [phone] with questions or concerns regarding your invoice.   Our billing staff will not be able to assist you with questions regarding bills from these companies.  You will be contacted with the lab results as soon as they are available. The fastest way to get your  results is to activate your My Chart account. Instructions are located on the last page of this paperwork. If you have not heard from [place] regarding the results in 2 weeks, please contact this office.

## 2016-12-29 HISTORY — PX: EYE SURGERY: SHX253

## 2017-01-01 ENCOUNTER — Encounter: Payer: Self-pay | Admitting: Cardiology

## 2017-01-01 ENCOUNTER — Ambulatory Visit (INDEPENDENT_AMBULATORY_CARE_PROVIDER_SITE_OTHER): Payer: Medicare Other | Admitting: Cardiology

## 2017-01-01 VITALS — BP 154/100 | HR 62 | Ht 64.0 in | Wt 132.4 lb

## 2017-01-01 DIAGNOSIS — I10 Essential (primary) hypertension: Secondary | ICD-10-CM

## 2017-01-01 DIAGNOSIS — R002 Palpitations: Secondary | ICD-10-CM | POA: Diagnosis not present

## 2017-01-01 DIAGNOSIS — Z7901 Long term (current) use of anticoagulants: Secondary | ICD-10-CM

## 2017-01-01 DIAGNOSIS — I480 Paroxysmal atrial fibrillation: Secondary | ICD-10-CM | POA: Diagnosis not present

## 2017-01-01 NOTE — Progress Notes (Signed)
PCP: Donna Simmons, Michael L, PA-C  Clinic Note: Chief Complaint  Patient presents with  . Follow-up    Pt. states no complaints; episode of Heart Pounding (not fast)    HPI: Donna Simmons is a 76 y.o. female with a PMH below who presents today for Follow-up for hypertension and atrial fibrillation She's had several frequent episodes of PACs and PVCs. She also has documented PAF on ELIQUIS and flecainide. Her last ER visit for A. fib was in November 2017. By time she arrived she is back in sinus rhythm. This is the only time she notes chest discomfort is when she is actually in A. fib.  Donna Simmons was last seen on On February 5, and was doing fairly well with no recurrent episodes of A. fib since her November 2017 episode.  Recent Hospitalizations: None  Studies Personally Reviewed - if available, images/films reviewed: From Epic Chart or Care Everywhere  None  Interval History: Donna Simmons presented today because she had a few episodes over the last couple days (last one was 2 days ago when she woke up with a sensation of her heart pounding and feelings as though it would skip a beat. Episodes would last about 30 seconds and then resolve. Did not feel at all like her atrial fibrillation spells. They were more slow hard pounding beats that would intermittently be a pounding sensation in an otherwise been normal. This startled her and had her somewhat scared. She was not dizzy or woozy associated with it. She had no real dyspnea associated besides during each interval between beats. Once she got up and walk around the symptoms felt better. She actually did take an extra dose of flecainide when the most significant episode occurred and it resolved pretty quickly. No further episodes of occurred since.  Besides these couple episodes, she has not had any symptoms of rapid irregular heartbeats to suggest recurrence of A. fib. No chest pain or shortness of breath with rest or exertion. No PND,  orthopnea or edema.  No syncope/near syncope, or TIA/amaurosis fugax symptoms. No melena, hematochezia, hematuria, or epstaxis. No claudication.  ROS: A comprehensive was performed. All pertinent positives and negatives noted above. Review of Systems  Psychiatric/Behavioral: The patient is nervous/anxious (When she started having these episodes).   All other systems reviewed and are negative.   I have reviewed and (if needed) personally updated the patient's problem list, medications, allergies, past medical and surgical history, social and family history.   Past Medical History:  Diagnosis Date  . Allergy   . GERD (gastroesophageal reflux disease)   . Heart murmur    No significant valvular lesion noted on echo.  . Hypercholesteremia   . Hypertension   . Palpitations 01/10/2009   14 day monitor- some sinus tachycardia, PVCs  . Paroxysmal atrial fibrillation (HCC) 12/27/2008    Past Surgical History:  Procedure Laterality Date  . ABDOMINAL HYSTERECTOMY  1980  . BREAST SURGERY    . CARDIAC CATHETERIZATION  12/16/2010   no evidence of CAD to explain anginal pain w/ positive troponin.  potential etiology is breakthrough AF  . FRACTURE SURGERY    . NM MYOVIEW LTD  April 2010   EF 64%, normal pattern of perfusion in all regions, no scintigraphic evidence of inducible ischemia; no significant wall motion abnormalities; EKG negative for ischemia; no significant change from last study; low risk scan  . TRANSTHORACIC ECHOCARDIOGRAM  April 2010   Echo - EF >55; RV mils/mod dilated, RV systolic  function mildly reduced; RA mild/moderately dilated; moderate pulmonary hypertension; mild tricuspid regurgitation    Current Meds  Medication Sig  . alum & mag hydroxide-simeth (MAALOX ADVANCED) 200-200-20 MG/5ML suspension Take 30 mLs by mouth every 6 (six) hours as needed for indigestion or heartburn.  Marland Kitchen amLODipine (NORVASC) 10 MG tablet Take 1 tablet (10 mg total) by mouth daily.  Marland Kitchen  atorvastatin (LIPITOR) 10 MG tablet TAKE ONE TABLET BY MOUTH ONCE DAILY WITH  LUNCH  . conjugated estrogens (PREMARIN) vaginal cream Place 0.5 Applicatorfuls vaginally 2 (two) times a week.  Marland Kitchen ELIQUIS 5 MG TABS tablet TAKE ONE TABLET BY MOUTH TWICE DAILY  . flecainide (TAMBOCOR) 50 MG tablet Take 1 tablet (50 mg total) by mouth 2 (two) times daily.  . fluticasone (FLONASE) 50 MCG/ACT nasal spray Place 2 sprays into both nostrils daily.  Marland Kitchen lisinopril (PRINIVIL,ZESTRIL) 20 MG tablet Take 20 mg by mouth daily.  . metoprolol succinate (TOPROL-XL) 25 MG 24 hr tablet TAKE ONE TABLET BY MOUTH ONCE DAILY BEFORE  DINNER,  MAY  TAKE  AN  ADDITIONAL  ONE-HALF  TABLET  6  HOURS  AFTER  AS  NEEDED  . Multiple Vitamin (MULTIVITAMIN WITH MINERALS) TABS tablet Take 1 tablet by mouth daily with lunch. Centrum Silver  . omeprazole (PRILOSEC) 20 MG capsule TAKE ONE CAPSULE ONCE DAILY, THEN TAKE ONE TWICE DAILY FOR 14 DAYS EVERY 4 MONTHS  . ranitidine (ZANTAC) 150 MG tablet Take 1 tablet by mouth daily.  . sucralfate (CARAFATE) 1 GM/10ML suspension Take 10 mLs (1 g total) by mouth 4 (four) times daily -  with meals and at bedtime.    Allergies  Allergen Reactions  . Antihistamines, Chlorpheniramine-Type Palpitations    Makes heart beat fast    Social History   Social History  . Marital status: Married    Spouse name: N/A  . Number of children: N/A  . Years of education: N/A   Social History Main Topics  . Smoking status: Never Smoker  . Smokeless tobacco: Never Used  . Alcohol use No  . Drug use: No  . Sexual activity: Yes   Other Topics Concern  . None   Social History Narrative   Married with no children. Walks several times a week at least 3-4 times a week about 2 miles a time. Does not smoke and does not drink.    family history includes Arthritis in her mother; Cancer in her maternal grandmother; Cancer - Lung in her brother and brother; Cancer - Prostate in her brother; Dementia in her  mother and sister; Heart disease in her father and maternal grandfather; Hypertension in her brother, mother, and sister.  Wt Readings from Last 3 Encounters:  01/01/17 132 lb 6.4 oz (60.1 kg)  12/22/16 126 lb 6.4 oz (57.3 kg)  10/06/16 128 lb 1.9 oz (58.1 kg)    PHYSICAL EXAM BP (!) 154/100   Pulse 62   Ht 5\' 4"  (1.626 m)   Wt 132 lb 6.4 oz (60.1 kg)   BMI 22.73 kg/m  General appearance: alert, cooperative, appears stated age, no distress and Healthy-appearing. She is wearing dark glasses because of her recent cataract surgery. She is due for her next cataract surgery in a month. Lungs: clear to auscultation bilaterally, normal percussion bilaterally and non-labored Heart: regular rate and rhythm, S1& S2 normal, no murmur, click, rub or gallop  Abdomen: soft, non-tender; bowel sounds normal; no masses,  no organomegaly Extremities: extremities normal, atraumatic, no cyanosis, or edema Pulses:  2+ and symmetric Neurologic: Mental status: Alert, oriented, thought content appropriate    Adult ECG Report  Rate: 62 ;  Rhythm: normal sinus rhythm and Normal axis, intervals and durations.;   Narrative Interpretation: Normal EKG   Other studies Reviewed: Additional studies/ records that were reviewed today include:  Recent Labs:  n/a     ASSESSMENT / PLAN: Problem List Items Addressed This Visit    Essential hypertension - Primary (Chronic)    Blood pressure still looks high today. When I last saw her we increased her amlodipine to 10 mg. She tells me it home that her blood pressure has been running roughly at 130/83-suspect she may just be a bit anxious here today. Would recommend that we just simply continue to monitor.      Relevant Orders   EKG 12-Lead   Long term current use of anticoagulant therapy (Chronic)   Relevant Orders   EKG 12-Lead   Palpitations (Chronic)    I think is probably the first time she has had PVC type symptoms in the setting of being on flecainide  and Toprol. The symptoms she describes sound very consistent with probably PACs or PVCs in bigeminy or trigeminy - with baseline bradycardia (this makes the pauses between beats longer in the interval beats more forceful). The fact that they'll improve with flecainide dosing would argue that she did the right thing by taking the extra dose. I explained this to her that she did the exact right thing that she was should've been done. I reassured her mobility symptoms are and she was quite happy. She has not had any further symptoms since she upped her dose of flecainide for a day.  This will be her plan for future events.      Relevant Orders   EKG 12-Lead   Paroxysmal atrial fibrillation (HCC) (Chronic)    I don't think the episodes that she have her A. fib because she usually has chest pain with those episodes. Maintained sinus rhythm with flecainide on beta blocker for rate control. Again using when necessary dose of additional flecainide for breakthrough symptoms. Antiplatelet with ELIQUIS without bleeding issues.      Relevant Orders   EKG 12-Lead      Current medicines are reviewed at length with the patient today. (+/- concerns) n/a The following changes have been made: -- no change.  Patient Instructions  NO CHANGES WITH CURRENT TREATMENT   YOU DID THE RIGHT THINK IN TAKING FLECAINIDE     Your physician wants you to follow-up in 6 MONTHS WITH DR Shariece Viveiros.You will receive a reminder letter in the mail two months in advance. If you don't receive a letter, please call our office to schedule the follow-up appointment.   If you need a refill on your cardiac medications before your next appointment, please call your pharmacy.     Studies Ordered:   Orders Placed This Encounter  Procedures  . EKG 12-Lead      Bryan Lemma, M.D., M.S. Interventional Cardiologist   Pager # [phone] Phone # [phone] [address] [place], [place] [address]

## 2017-01-01 NOTE — Patient Instructions (Addendum)
NO CHANGES WITH CURRENT TREATMENT   YOU DID THE RIGHT THINK IN TAKING FLECAINIDE     Your physician wants you to follow-up in 6 MONTHS WITH DR HARDING.You will receive a reminder letter in the mail two months in advance. If you don't receive a letter, please call our office to schedule the follow-up appointment.   If you need a refill on your cardiac medications before your next appointment, please call your pharmacy.

## 2017-01-03 ENCOUNTER — Encounter: Payer: Self-pay | Admitting: Cardiology

## 2017-01-03 ENCOUNTER — Other Ambulatory Visit: Payer: Self-pay | Admitting: Physician Assistant

## 2017-01-03 DIAGNOSIS — J301 Allergic rhinitis due to pollen: Secondary | ICD-10-CM

## 2017-01-03 NOTE — Assessment & Plan Note (Signed)
I don't think the episodes that she have her A. fib because she usually has chest pain with those episodes. Maintained sinus rhythm with flecainide on beta blocker for rate control. Again using when necessary dose of additional flecainide for breakthrough symptoms. Antiplatelet with ELIQUIS without bleeding issues.

## 2017-01-03 NOTE — Assessment & Plan Note (Signed)
I think is probably the first time she has had PVC type symptoms in the setting of being on flecainide and Toprol. The symptoms she describes sound very consistent with probably PACs or PVCs in bigeminy or trigeminy - with baseline bradycardia (this makes the pauses between beats longer in the interval beats more forceful). The fact that they'll improve with flecainide dosing would argue that she did the right thing by taking the extra dose. I explained this to her that she did the exact right thing that she was should've been done. I reassured her mobility symptoms are and she was quite happy. She has not had any further symptoms since she upped her dose of flecainide for a day.  This will be her plan for future events.

## 2017-01-03 NOTE — Assessment & Plan Note (Signed)
Blood pressure still looks high today. When I last saw her we increased her amlodipine to 10 mg. She tells me it home that her blood pressure has been running roughly at 130/83-suspect she may just be a bit anxious here today. Would recommend that we just simply continue to monitor.

## 2017-01-15 ENCOUNTER — Ambulatory Visit (INDEPENDENT_AMBULATORY_CARE_PROVIDER_SITE_OTHER): Payer: Medicare Other | Admitting: Pharmacist

## 2017-01-15 VITALS — BP 158/80 | HR 69

## 2017-01-15 DIAGNOSIS — I10 Essential (primary) hypertension: Secondary | ICD-10-CM | POA: Diagnosis not present

## 2017-01-15 NOTE — Patient Instructions (Addendum)
Return for a  follow up appointment in 8 weeks  Your blood pressure today is 159/80 pulse 69  Check your blood pressure at home daily (if able) and keep record of the readings.  Take your BP meds as follows: **ALL medication as prescribed**  Bring all of your meds, your BP cuff and your record of home blood pressures to your next appointment.  Exercise as you're able, try to walk approximately 30 minutes per day.  Keep salt intake to a minimum, especially watch canned and prepared boxed foods.  Eat more fresh fruits and vegetables and fewer canned items.  Avoid eating in fast food restaurants.    HOW TO TAKE YOUR BLOOD PRESSURE: . Rest 5 minutes before taking your blood pressure. .  Don't smoke or drink caffeinated beverages for at least 30 minutes before. . Take your blood pressure before (not after) you eat. . Sit comfortably with your back supported and both feet on the floor (don't cross your legs). . Elevate your arm to heart level on a table or a desk. . Use the proper sized cuff. It should fit smoothly and snugly around your bare upper arm. There should be enough room to slip a fingertip under the cuff. The bottom edge of the cuff should be 1 inch above the crease of the elbow. . Ideally, take 3 measurements at one sitting and record the average.

## 2017-01-15 NOTE — Progress Notes (Signed)
Patient ID: Donna MaserMary P Simmons                 DOB: 1941/07/21                      MRN: [id number]     HPI: Donna MaserMary P Simmons is a 76 y.o. female referred by Dr. Herbie BaltimoreHarding to HTN clinic. PMH includes A-Fib, hyperlipidemia and hypertension.  Patient experienced some light-headedness in the past while taking lisinopril 30mg  plus metoprolol 25mg  plus amlodipine 10mg .  Blood pressure was elevated during most recent OV with cardiologist (Dr Herbie BaltimoreHarding) on 01/01/2017. No changes to medication therapy were recommended at the time due to anxiety during OV and good home BP readings.   Denies dizziness, light-headedness or chest pain.  Reports complete resolution of dizziness and not experiencing any other ADR.  She suffered a fall this morning while trying to reach behind her bed but not related to dizziness.    Current HTN meds:  Lisinopril 20mg  daily Metoprolol succinate 25mg  daily Amlodipine 10mg  daily  BP goal: 140/90  Family History: Father died from HF at age 74, sister from MI at age 74  Social History: denies tobacco or alcohol use  Diet: Mostly home cooked meals, avoid adding salt to food, eats more fish and chicken  Exercise: walks 2 miles 3x/week, [facility]  Home BP readings: no records ; 136/75 this morning per patient recall; mostly 130s systolic at home  Wt Readings from Last 3 Encounters:  01/01/17 132 lb 6.4 oz (60.1 kg)  12/22/16 126 lb 6.4 oz (57.3 kg)  10/06/16 128 lb 1.9 oz (58.1 kg)   BP Readings from Last 3 Encounters:  01/15/17 (!) 158/80  01/01/17 (!) 154/100  12/22/16 (!) 157/80   Pulse Readings from Last 3 Encounters:  01/15/17 69  01/01/17 62  12/22/16 71    Past Medical History:  Diagnosis Date  . Allergy   . GERD (gastroesophageal reflux disease)   . Heart murmur    No significant valvular lesion noted on echo.  . Hypercholesteremia   . Hypertension   . Palpitations 01/10/2009   14 day monitor- some sinus tachycardia, PVCs  . Paroxysmal atrial  fibrillation (HCC) 12/27/2008    Current Outpatient Prescriptions on File Prior to Visit  Medication Sig Dispense Refill  . alum & mag hydroxide-simeth (MAALOX ADVANCED) 200-200-20 MG/5ML suspension Take 30 mLs by mouth every 6 (six) hours as needed for indigestion or heartburn. 355 mL 0  . amLODipine (NORVASC) 10 MG tablet Take 1 tablet (10 mg total) by mouth daily. 90 tablet 3  . atorvastatin (LIPITOR) 10 MG tablet TAKE ONE TABLET BY MOUTH ONCE DAILY WITH  LUNCH 90 tablet 3  . conjugated estrogens (PREMARIN) vaginal cream Place 0.5 Applicatorfuls vaginally 2 (two) times a week. 42.5 g 1  . ELIQUIS 5 MG TABS tablet TAKE ONE TABLET BY MOUTH TWICE DAILY 60 tablet 3  . flecainide (TAMBOCOR) 50 MG tablet Take 1 tablet (50 mg total) by mouth 2 (two) times daily. 180 tablet 3  . fluticasone (FLONASE) 50 MCG/ACT nasal spray USE TWO SPRAY(S) IN EACH NOSTRIL ONCE DAILY 16 g 1  . lisinopril (PRINIVIL,ZESTRIL) 20 MG tablet Take 20 mg by mouth daily.    . metoprolol succinate (TOPROL-XL) 25 MG 24 hr tablet TAKE ONE TABLET BY MOUTH ONCE DAILY BEFORE  DINNER,  MAY  TAKE  AN  ADDITIONAL  ONE-HALF  TABLET  6  HOURS  AFTER  AS  NEEDED 135 tablet 3  . Multiple Vitamin (MULTIVITAMIN WITH MINERALS) TABS tablet Take 1 tablet by mouth daily with lunch. Centrum Silver    . omeprazole (PRILOSEC) 20 MG capsule TAKE ONE CAPSULE ONCE DAILY, THEN TAKE ONE TWICE DAILY FOR 14 DAYS EVERY 4 MONTHS 90 capsule 1  . ranitidine (ZANTAC) 150 MG tablet Take 1 tablet by mouth daily.    . sucralfate (CARAFATE) 1 GM/10ML suspension Take 10 mLs (1 g total) by mouth 4 (four) times daily -  with meals and at bedtime. 420 mL 0   No current facility-administered medications on file prior to visit.     Allergies  Allergen Reactions  . Antihistamines, Chlorpheniramine-Type Palpitations    Makes heart beat fast    Blood pressure (!) 158/80, pulse 69, SpO2 99 %.  Essential hypertension: Blood pressure remains elevated during office  visit but patient is known to suffer from "white coat" syndrome. Patient BP at home remains controlled with readings in 130s systolic and today's morning reading at 136/75.  Also noted patient had a fall few hours ago and still experiencing some discomfort from it.  Will continue current therapy without changes and follow up in 8 weeks. Patient instructed to bring home BP log and home monitor for next appointment. Plan to increase lisinopril to 30mg  daily if additional BP control needed.  Katisha Shimizu Rodriguez-Guzman PharmD, BCPS [facility] [address] [place] [address] 01/15/2017 2:08 PM

## 2017-01-19 HISTORY — PX: EYE SURGERY: SHX253

## 2017-02-02 ENCOUNTER — Encounter: Payer: Self-pay | Admitting: Emergency Medicine

## 2017-02-02 ENCOUNTER — Ambulatory Visit (INDEPENDENT_AMBULATORY_CARE_PROVIDER_SITE_OTHER): Payer: Medicare Other | Admitting: Emergency Medicine

## 2017-02-02 VITALS — BP 162/78 | HR 71 | Temp 97.9°F | Resp 18 | Ht 63.94 in | Wt 131.6 lb

## 2017-02-02 DIAGNOSIS — L239 Allergic contact dermatitis, unspecified cause: Secondary | ICD-10-CM | POA: Diagnosis not present

## 2017-02-02 DIAGNOSIS — L299 Pruritus, unspecified: Secondary | ICD-10-CM

## 2017-02-02 MED ORDER — PREDNISONE 20 MG PO TABS: 20.0000 mg | ORAL_TABLET | Freq: Every day | ORAL | 1 refills | Status: AC

## 2017-02-02 MED ORDER — TRIAMCINOLONE ACETONIDE 0.1 % EX CREA: 1.0000 "application " | TOPICAL_CREAM | Freq: Two times a day (BID) | CUTANEOUS | 0 refills | Status: DC

## 2017-02-02 MED ORDER — PREDNISONE 20 MG PO TABS: 40.0000 mg | ORAL_TABLET | Freq: Every day | ORAL | 0 refills | Status: DC

## 2017-02-02 NOTE — Patient Instructions (Signed)
Hives Hives (urticaria) are itchy, red, swollen areas on your skin. Hives can show up on any part of your body, and they can vary in size. They can be as small as the tip of a pen or much larger. Hives often fade within 24 hours (acute hives). In other cases, new hives show up after old ones fade. This can continue for many days or weeks (chronic hives). Hives are caused by your body's reaction to an irritant or to something that you are allergic to (trigger). You can get hives right after being around a trigger or hours later. Hives do not spread from person to person (are not contagious). Hives may get worse if you scratch them, if you exercise, or if you have worries (emotional stress). Follow these instructions at home: Medicines  Take or apply over-the-counter and prescription medicines only as told by your doctor.  If you were prescribed an antibiotic medicine, use it as told by your doctor. Do not stop taking the antibiotic even if you start to feel better. Skin Care  Apply cool, wet cloths (cool compresses) to the itchy, red, swollen areas.  Do not scratch your skin. Do not rub your skin. General instructions  Do not take hot showers or baths. This can make itching worse.  Do not wear tight clothes.  Use sunscreen and wear clothing that covers your skin when you are outside.  Avoid any triggers that cause your hives. Keep a journal to help you keep track of what causes your hives. Write down: ? What medicines you take. ? What you eat and drink. ? What products you use on your skin.  Keep all follow-up visits as told by your doctor. This is important. Contact a doctor if:  Your symptoms are not better with medicine.  Your joints are painful or swollen. Get help right away if:  You have a fever.  You have belly pain.  Your tongue or lips are swollen.  Your eyelids are swollen.  Your chest or throat feels tight.  You have trouble breathing or swallowing. These  symptoms may be an emergency. Do not wait to see if the symptoms will go away. Get medical help right away. Call your local emergency services (911 in the [place] Do not drive yourself to the hospital. This information is not intended to replace advice given to you by your health care provider. Make sure you discuss any questions you have with your health care provider. Document Released: 05/27/2008 Document Revised: 01/24/2016 Document Reviewed: 06/06/2015 Elsevier Interactive Patient Education  2018 Elsevier Inc.  

## 2017-02-02 NOTE — Progress Notes (Signed)
Donna Simmons 76 y.o.   Chief Complaint  Patient presents with  . Rash    X 1 week- arms, chest, shoulder and back    HISTORY OF PRESENT ILLNESS: This is a 76 y.o. female complaining of itchy rash that comes and goes x 1 week; no other significant symptoms.  Rash  This is a new problem. The current episode started in the past 7 days. The problem has been waxing and waning since onset. The rash is diffuse. The rash is characterized by burning, redness and itchiness. She was exposed to nothing. Pertinent negatives include no anorexia, congestion, cough, diarrhea, facial edema, fever, joint pain, rhinorrhea, shortness of breath, sore throat or vomiting.     Prior to Admission medications   Medication Sig Start Date End Date Taking? Authorizing Provider  alum & mag hydroxide-simeth (MAALOX ADVANCED) 200-200-20 MG/5ML suspension Take 30 mLs by mouth every 6 (six) hours as needed for indigestion or heartburn. 07/19/16  Yes Shaune PollackIsaacs, Cameron, MD  atorvastatin (LIPITOR) 10 MG tablet TAKE ONE TABLET BY MOUTH ONCE DAILY WITH  LUNCH 06/02/16  Yes Marykay LexHarding, David W, MD  conjugated estrogens (PREMARIN) vaginal cream Place 0.5 Applicatorfuls vaginally 2 (two) times a week. 12/20/15  Yes Kindl, Quita SkyeJames D, MD  ELIQUIS 5 MG TABS tablet TAKE ONE TABLET BY MOUTH TWICE DAILY 09/24/16  Yes Marykay LexHarding, David W, MD  flecainide (TAMBOCOR) 50 MG tablet Take 1 tablet (50 mg total) by mouth 2 (two) times daily. 04/04/16  Yes Ok AnisBerge, Christopher R, NP  fluticasone (FLONASE) 50 MCG/ACT nasal spray USE TWO SPRAY(S) IN EACH NOSTRIL ONCE DAILY 01/05/17  Yes Shade FloodGreene, Jeffrey R, MD  lisinopril (PRINIVIL,ZESTRIL) 20 MG tablet Take 20 mg by mouth daily.   Yes [provider]  metoprolol succinate (TOPROL-XL) 25 MG 24 hr tablet TAKE ONE TABLET BY MOUTH ONCE DAILY BEFORE  DINNER,  MAY  TAKE  AN  ADDITIONAL  ONE-HALF  TABLET  6  HOURS  AFTER  AS  NEEDED 11/25/16  Yes Marykay LexHarding, David W, MD  Multiple Vitamin (MULTIVITAMIN WITH MINERALS)  TABS tablet Take 1 tablet by mouth daily with lunch. Centrum Silver   Yes [provider]  omeprazole (PRILOSEC) 20 MG capsule TAKE ONE CAPSULE ONCE DAILY, THEN TAKE ONE TWICE DAILY FOR 14 DAYS EVERY 4 MONTHS 07/22/16  Yes Shade FloodGreene, Jeffrey R, MD  ranitidine (ZANTAC) 150 MG tablet Take 1 tablet by mouth daily. 07/23/16  Yes [provider]  sucralfate (CARAFATE) 1 GM/10ML suspension Take 10 mLs (1 g total) by mouth 4 (four) times daily -  with meals and at bedtime. 06/03/16  Yes Shade FloodGreene, Jeffrey R, MD  amLODipine (NORVASC) 10 MG tablet Take 1 tablet (10 mg total) by mouth daily. 10/06/16 01/04/17  Lewayne Buntingrenshaw, Brian S, MD    Allergies  Allergen Reactions  . Antihistamines, Chlorpheniramine-Type Palpitations    Makes heart beat fast    Patient Active Problem List   Diagnosis Date Noted  . Dysphagia 11/05/2015  . Long term current use of anticoagulant therapy 11/18/2012  . Hypercholesteremia 09/25/2012  . DDD (degenerative disc disease), cervical 05/19/2012  . Paroxysmal atrial fibrillation (HCC) 04/20/2012  . Essential hypertension 04/20/2012  . Palpitations 01/10/2009    Past Medical History:  Diagnosis Date  . Allergy   . GERD (gastroesophageal reflux disease)   . Heart murmur    No significant valvular lesion noted on echo.  . Hypercholesteremia   . Hypertension   . Palpitations 01/10/2009   14 day monitor- some sinus  tachycardia, PVCs  . Paroxysmal atrial fibrillation (HCC) 12/27/2008    Past Surgical History:  Procedure Laterality Date  . ABDOMINAL HYSTERECTOMY  1980  . BREAST SURGERY    . CARDIAC CATHETERIZATION  12/16/2010   no evidence of CAD to explain anginal pain w/ positive troponin.  potential etiology is breakthrough AF  . EYE SURGERY Right 12/29/2016  . EYE SURGERY Left 01/19/2017  . FRACTURE SURGERY    . NM MYOVIEW LTD  April 2010   EF 64%, normal pattern of perfusion in all regions, no scintigraphic evidence of inducible ischemia; no significant wall  motion abnormalities; EKG negative for ischemia; no significant change from last study; low risk scan  . TRANSTHORACIC ECHOCARDIOGRAM  April 2010   Echo - EF >55; RV mils/mod dilated, RV systolic function mildly reduced; RA mild/moderately dilated; moderate pulmonary hypertension; mild tricuspid regurgitation    Social History   Social History  . Marital status: Married    Spouse name: N/A  . Number of children: N/A  . Years of education: N/A   Occupational History  . Not on file.   Social History Main Topics  . Smoking status: Never Smoker  . Smokeless tobacco: Never Used  . Alcohol use No  . Drug use: No  . Sexual activity: Yes   Other Topics Concern  . Not on file   Social History Narrative   Married with no children. Walks several times a week at least 3-4 times a week about 2 miles a time. Does not smoke and does not drink.    Family History  Problem Relation Age of Onset  . Arthritis Mother        OSTEO  . Hypertension Mother   . Dementia Mother   . Heart disease Father   . Hypertension Sister   . Dementia Sister   . Cancer - Lung Brother   . Cancer Maternal Grandmother   . Heart disease Maternal Grandfather   . Cancer - Lung Brother   . Hypertension Brother   . Cancer - Prostate Brother      Review of Systems  Constitutional: Negative for chills and fever.  HENT: Negative.  Negative for congestion, nosebleeds, rhinorrhea, sinus pain and sore throat.   Eyes: Negative.  Negative for discharge and redness.  Respiratory: Negative for cough, hemoptysis and shortness of breath.   Cardiovascular: Negative.  Negative for chest pain and palpitations.  Gastrointestinal: Negative for abdominal pain, anorexia, blood in stool, diarrhea, nausea and vomiting.  Genitourinary: Negative for hematuria.  Musculoskeletal: Negative.  Negative for joint pain.  Skin: Positive for itching and rash.  Neurological: Negative for dizziness and headaches.  Endo/Heme/Allergies:  Negative.   All other systems reviewed and are negative.  Vitals:   02/02/17 1436  BP: (!) 162/78  Pulse: 71  Resp: 18  Temp: 97.9 F (36.6 C)     Physical Exam  Constitutional: She is oriented to person, place, and time. She appears well-developed and well-nourished.  HENT:  Head: Normocephalic and atraumatic.  Nose: Nose normal.  Mouth/Throat: Oropharynx is clear and moist.  Eyes: Conjunctivae and EOM are normal. Pupils are equal, round, and reactive to light.  Neck: Normal range of motion. Neck supple.  Cardiovascular: Normal rate, regular rhythm and normal heart sounds.   Pulmonary/Chest: Effort normal and breath sounds normal.  Abdominal: Soft. She exhibits no distension. There is no tenderness.  Musculoskeletal: Normal range of motion.  Neurological: She is alert and oriented to person, place, and time. No  sensory deficit. She exhibits normal muscle tone.  Skin: Skin is warm and dry. Rash (+wheals in arms and back) noted.  Psychiatric: She has a normal mood and affect. Her behavior is normal.  Vitals reviewed.    ASSESSMENT & PLAN: Donna Simmons was seen today for rash.  Diagnoses and all orders for this visit:  Allergic dermatitis  Other orders -     Discontinue: predniSONE (DELTASONE) 20 MG tablet; Take 2 tablets (40 mg total) by mouth daily with breakfast. -     triamcinolone cream (KENALOG) 0.1 %; Apply 1 application topically 2 (two) times daily. -     predniSONE (DELTASONE) 20 MG tablet; Take 1 tablet (20 mg total) by mouth daily with breakfast.    Patient Instructions  Hives Hives (urticaria) are itchy, red, swollen areas on your skin. Hives can show up on any part of your body, and they can vary in size. They can be as small as the tip of a pen or much larger. Hives often fade within 24 hours (acute hives). In other cases, new hives show up after old ones fade. This can continue for many days or weeks (chronic hives). Hives are caused by your body's reaction to  an irritant or to something that you are allergic to (trigger). You can get hives right after being around a trigger or hours later. Hives do not spread from person to person (are not contagious). Hives may get worse if you scratch them, if you exercise, or if you have worries (emotional stress). Follow these instructions at home: Medicines  Take or apply over-the-counter and prescription medicines only as told by your doctor.  If you were prescribed an antibiotic medicine, use it as told by your doctor. Do not stop taking the antibiotic even if you start to feel better. Skin Care  Apply cool, wet cloths (cool compresses) to the itchy, red, swollen areas.  Do not scratch your skin. Do not rub your skin. General instructions  Do not take hot showers or baths. This can make itching worse.  Do not wear tight clothes.  Use sunscreen and wear clothing that covers your skin when you are outside.  Avoid any triggers that cause your hives. Keep a journal to help you keep track of what causes your hives. Write down: ? What medicines you take. ? What you eat and drink. ? What products you use on your skin.  Keep all follow-up visits as told by your doctor. This is important. Contact a doctor if:  Your symptoms are not better with medicine.  Your joints are painful or swollen. Get help right away if:  You have a fever.  You have belly pain.  Your tongue or lips are swollen.  Your eyelids are swollen.  Your chest or throat feels tight.  You have trouble breathing or swallowing. These symptoms may be an emergency. Do not wait to see if the symptoms will go away. Get medical help right away. Call your local emergency services (911 in the [place] Do not drive yourself to the hospital. This information is not intended to replace advice given to you by your health care provider. Make sure you discuss any questions you have with your health care provider. Document Released: 05/27/2008  Document Revised: 01/24/2016 Document Reviewed: 06/06/2015 Elsevier Interactive Patient Education  2018 Elsevier Inc.      Edwina Barth, MD [facility]

## 2017-02-24 ENCOUNTER — Other Ambulatory Visit: Payer: Self-pay | Admitting: Cardiology

## 2017-03-12 ENCOUNTER — Ambulatory Visit: Payer: Medicare Other

## 2017-03-23 ENCOUNTER — Ambulatory Visit (INDEPENDENT_AMBULATORY_CARE_PROVIDER_SITE_OTHER): Payer: Medicare Other | Admitting: Pharmacist

## 2017-03-23 VITALS — BP 150/80 | HR 64

## 2017-03-23 DIAGNOSIS — I10 Essential (primary) hypertension: Secondary | ICD-10-CM | POA: Diagnosis not present

## 2017-03-23 NOTE — Patient Instructions (Addendum)
Return for a follow up appointment as needed  Your blood pressure today is 150/80 pulse 63   Check your blood pressure at home daily (if able) and keep record of the readings.  Take your BP meds as follows: *ALL medication as prescribed*  Bring all of your meds, your BP cuff and your record of home blood pressures to your next appointment.  Exercise as you're able, try to walk approximately 30 minutes per day.  Keep salt intake to a minimum, especially watch canned and prepared boxed foods.  Eat more fresh fruits and vegetables and fewer canned items.  Avoid eating in fast food restaurants.    HOW TO TAKE YOUR BLOOD PRESSURE: . Rest 5 minutes before taking your blood pressure. .  Don't smoke or drink caffeinated beverages for at least 30 minutes before. . Take your blood pressure before (not after) you eat. . Sit comfortably with your back supported and both feet on the floor (don't cross your legs). . Elevate your arm to heart level on a table or a desk. . Use the proper sized cuff. It should fit smoothly and snugly around your bare upper arm. There should be enough room to slip a fingertip under the cuff. The bottom edge of the cuff should be 1 inch above the crease of the elbow. . Ideally, take 3 measurements at one sitting and record the average.

## 2017-03-23 NOTE — Progress Notes (Signed)
Patient ID: Donna Simmons                 DOB: 1941/04/25                      MRN: [id number]     HPI: Donna Simmons is a 76 y.o. female referred by Dr. Herbie Baltimore to HTN clinic. PMH includes A-Fib, hyperlipidemia and hypertension. Patient experienced some light-headedness in the past while taking lisinopril 30mg  plus metoprolol 25mg  plus amlodipine 10mg .  No changes to medication therapy were recommended during last OV due to anxiety and good home BP readings.   Patient presents today to HTN follow up. Denies dizziness, light-headedness or chest pain. She continues to exercise couple of time per week at the [facility]. Report noted correlation between GERD symptoms and high BP readings.    Current HTN meds:  Lisinopril 20mg  daily Metoprolol succinate 25mg  daily Amlodipine 10mg  daily  BP goal: 140/90  Family History: Father died from HF at age 39, sister from MI at age 32  Social History: denies tobacco or alcohol use  Diet: Mostly home cooked meals, avoid adding salt to food, eats more fish and chicken  Exercise: walks 2 miles 3x/week, yoga weekly at [facility] BP readings: 5 readings; average 138/79 (pulse 68-80)  Wt Readings from Last 3 Encounters:  02/02/17 131 lb 9.6 oz (59.7 kg)  01/01/17 132 lb 6.4 oz (60.1 kg)  12/22/16 126 lb 6.4 oz (57.3 kg)   BP Readings from Last 3 Encounters:  03/23/17 (!) 150/80  02/02/17 (!) 162/78  01/15/17 (!) 158/80   Pulse Readings from Last 3 Encounters:  03/23/17 64  02/02/17 71  01/15/17 69    Past Medical History:  Diagnosis Date  . Allergy   . GERD (gastroesophageal reflux disease)   . Heart murmur    No significant valvular lesion noted on echo.  . Hypercholesteremia   . Hypertension   . Palpitations 01/10/2009   14 day monitor- some sinus tachycardia, PVCs  . Paroxysmal atrial fibrillation (HCC) 12/27/2008    Current Outpatient Prescriptions on File Prior to Visit  Medication Sig Dispense Refill  . alum & mag  hydroxide-simeth (MAALOX ADVANCED) 200-200-20 MG/5ML suspension Take 30 mLs by mouth every 6 (six) hours as needed for indigestion or heartburn. 355 mL 0  . amLODipine (NORVASC) 10 MG tablet Take 1 tablet (10 mg total) by mouth daily. 90 tablet 3  . atorvastatin (LIPITOR) 10 MG tablet TAKE ONE TABLET BY MOUTH ONCE DAILY WITH  LUNCH 90 tablet 3  . conjugated estrogens (PREMARIN) vaginal cream Place 0.5 Applicatorfuls vaginally 2 (two) times a week. 42.5 g 1  . ELIQUIS 5 MG TABS tablet TAKE ONE TABLET BY MOUTH TWICE DAILY 60 tablet 5  . flecainide (TAMBOCOR) 50 MG tablet Take 1 tablet (50 mg total) by mouth 2 (two) times daily. 180 tablet 3  . fluticasone (FLONASE) 50 MCG/ACT nasal spray USE TWO SPRAY(S) IN EACH NOSTRIL ONCE DAILY 16 g 1  . lisinopril (PRINIVIL,ZESTRIL) 20 MG tablet Take 20 mg by mouth daily.    . metoprolol succinate (TOPROL-XL) 25 MG 24 hr tablet TAKE ONE TABLET BY MOUTH ONCE DAILY BEFORE  DINNER,  MAY  TAKE  AN  ADDITIONAL  ONE-HALF  TABLET  6  HOURS  AFTER  AS  NEEDED 135 tablet 3  . Multiple Vitamin (MULTIVITAMIN WITH MINERALS) TABS tablet Take 1 tablet by mouth daily with lunch. Centrum Silver    .  omeprazole (PRILOSEC) 20 MG capsule TAKE ONE CAPSULE ONCE DAILY, THEN TAKE ONE TWICE DAILY FOR 14 DAYS EVERY 4 MONTHS 90 capsule 1  . ranitidine (ZANTAC) 150 MG tablet Take 1 tablet by mouth daily.    . sucralfate (CARAFATE) 1 GM/10ML suspension Take 10 mLs (1 g total) by mouth 4 (four) times daily -  with meals and at bedtime. 420 mL 0  . triamcinolone cream (KENALOG) 0.1 % Apply 1 application topically 2 (two) times daily. 30 g 0   No current facility-administered medications on file prior to visit.     Allergies  Allergen Reactions  . Antihistamines, Chlorpheniramine-Type Palpitations    Makes heart beat fast    Blood pressure (!) 150/80, pulse 64, SpO2 99 %.  Essential hypertension Blood pressure remains above goal during office visit but well controlled at home with an  average reading of 138/79. Patient reports severe dizziness if blood when more agressive BP control targeted. Will continue current regimen without changes and follow up with HTN clinic as needed. Patient encouraged to call clinic if systolic BP > 150 or <100.   Dijon Cosens Rodriguez-Guzman PharmD, BCPS [facility] [address] [place] [phone] 03/24/2017 11:09 AM

## 2017-03-24 NOTE — Assessment & Plan Note (Signed)
Blood pressure remains above goal during office visit but well controlled at home with an average reading of 138/79. Patient reports severe dizziness if blood when more agressive BP control targeted. Will continue current regimen without changes and follow up with HTN clinic as needed. Patient encouraged to call clinic if systolic BP > 150 or <100.

## 2017-03-30 ENCOUNTER — Ambulatory Visit: Payer: Medicare Other | Admitting: Hematology & Oncology

## 2017-04-06 ENCOUNTER — Ambulatory Visit: Payer: Medicare Other | Admitting: Hematology & Oncology

## 2017-04-06 ENCOUNTER — Other Ambulatory Visit: Payer: Medicare Other

## 2017-04-20 ENCOUNTER — Ambulatory Visit (HOSPITAL_BASED_OUTPATIENT_CLINIC_OR_DEPARTMENT_OTHER): Payer: Medicare Other | Admitting: Hematology & Oncology

## 2017-04-20 ENCOUNTER — Other Ambulatory Visit (HOSPITAL_BASED_OUTPATIENT_CLINIC_OR_DEPARTMENT_OTHER): Payer: Medicare Other

## 2017-04-20 VITALS — BP 149/87 | HR 68 | Temp 97.9°F | Resp 18 | Wt 129.0 lb

## 2017-04-20 DIAGNOSIS — D708 Other neutropenia: Secondary | ICD-10-CM

## 2017-04-20 DIAGNOSIS — D72819 Decreased white blood cell count, unspecified: Secondary | ICD-10-CM

## 2017-04-20 DIAGNOSIS — D702 Other drug-induced agranulocytosis: Secondary | ICD-10-CM

## 2017-04-20 LAB — CBC WITH DIFFERENTIAL (CANCER CENTER ONLY)
BASO#: 0 10*3/uL (ref 0.0–0.2)
BASO%: 0.2 % (ref 0.0–2.0)
EOS%: 0.2 % (ref 0.0–7.0)
Eosinophils Absolute: 0 10*3/uL (ref 0.0–0.5)
HCT: 35.8 % (ref 34.8–46.6)
HGB: 11.8 g/dL (ref 11.6–15.9)
LYMPH#: 1.2 10*3/uL (ref 0.9–3.3)
LYMPH%: 28 % (ref 14.0–48.0)
MCH: 26.4 pg (ref 26.0–34.0)
MCHC: 33 g/dL (ref 32.0–36.0)
MCV: 80 fL — ABNORMAL LOW (ref 81–101)
MONO#: 0.4 10*3/uL (ref 0.1–0.9)
MONO%: 9.7 % (ref 0.0–13.0)
NEUT#: 2.6 10*3/uL (ref 1.5–6.5)
NEUT%: 61.9 % (ref 39.6–80.0)
Platelets: 200 10*3/uL (ref 145–400)
RBC: 4.47 10*6/uL (ref 3.70–5.32)
RDW: 14.9 % (ref 11.1–15.7)
WBC: 4.1 10*3/uL (ref 3.9–10.0)

## 2017-04-20 LAB — CHCC SATELLITE - SMEAR

## 2017-04-20 NOTE — Progress Notes (Signed)
Hematology and Oncology Follow Up Visit  Donna Simmons [id number] 17-Feb-1941 76 y.o. 04/20/2017   Principle Diagnosis:   Ethnic associated leukopenia (EAL)  Current Therapy:    Observation     Interim History:  Donna Simmons is back for follow-up. Since we last saw her in February, everything has been doing quite well. She's had no problem with infections. She's had no nausea or vomiting. She's had no rashes. She's had no swollen lymph nodes.  She is still working on a book about her family lineage. She says that it is almost done.  She has had no problems with her appetite. She's had no change in bowel or bladder habits. She does have some urinary incontinence because of a hysterectomy.  She is on ELIQUIS. She is on flecainide. I suppose the flecainide. I suppose this might be a contributor to the leukopenia.  Overall, her performance status is ECOG 0.  Medications:  Current Outpatient Prescriptions:  .  alum & mag hydroxide-simeth (MAALOX ADVANCED) 200-200-20 MG/5ML suspension, Take 30 mLs by mouth every 6 (six) hours as needed for indigestion or heartburn., Disp: 355 mL, Rfl: 0 .  amLODipine (NORVASC) 10 MG tablet, Take 1 tablet (10 mg total) by mouth daily., Disp: 90 tablet, Rfl: 3 .  atorvastatin (LIPITOR) 10 MG tablet, TAKE ONE TABLET BY MOUTH ONCE DAILY WITH  LUNCH, Disp: 90 tablet, Rfl: 3 .  conjugated estrogens (PREMARIN) vaginal cream, Place 0.5 Applicatorfuls vaginally 2 (two) times a week., Disp: 42.5 g, Rfl: 1 .  ELIQUIS 5 MG TABS tablet, TAKE ONE TABLET BY MOUTH TWICE DAILY, Disp: 60 tablet, Rfl: 5 .  flecainide (TAMBOCOR) 50 MG tablet, Take 1 tablet (50 mg total) by mouth 2 (two) times daily., Disp: 180 tablet, Rfl: 3 .  fluticasone (FLONASE) 50 MCG/ACT nasal spray, USE TWO SPRAY(S) IN EACH NOSTRIL ONCE DAILY, Disp: 16 g, Rfl: 1 .  lisinopril (PRINIVIL,ZESTRIL) 20 MG tablet, Take 20 mg by mouth daily., Disp: , Rfl:  .  metoprolol succinate (TOPROL-XL) 25 MG 24 hr  tablet, TAKE ONE TABLET BY MOUTH ONCE DAILY BEFORE  DINNER,  MAY  TAKE  AN  ADDITIONAL  ONE-HALF  TABLET  6  HOURS  AFTER  AS  NEEDED, Disp: 135 tablet, Rfl: 3 .  Multiple Vitamin (MULTIVITAMIN WITH MINERALS) TABS tablet, Take 1 tablet by mouth daily with lunch. Centrum Silver, Disp: , Rfl:  .  omeprazole (PRILOSEC) 20 MG capsule, TAKE ONE CAPSULE ONCE DAILY, THEN TAKE ONE TWICE DAILY FOR 14 DAYS EVERY 4 MONTHS, Disp: 90 capsule, Rfl: 1 .  sucralfate (CARAFATE) 1 GM/10ML suspension, Take 10 mLs (1 g total) by mouth 4 (four) times daily -  with meals and at bedtime., Disp: 420 mL, Rfl: 0  Allergies:  Allergies  Allergen Reactions  . Antihistamines, Chlorpheniramine-Type Palpitations    Makes heart beat fast    Past Medical History, Surgical history, Social history, and Family History were reviewed and updated.  Review of Systems:  As above  Physical Exam:  weight is 129 lb (58.5 kg). Her oral temperature is 97.9 F (36.6 C). Her blood pressure is 149/87 (abnormal) and her pulse is 68. Her respiration is 18 and oxygen saturation is 100%.   Wt Readings from Last 3 Encounters:  04/20/17 129 lb (58.5 kg)  02/02/17 131 lb 9.6 oz (59.7 kg)  01/01/17 132 lb 6.4 oz (60.1 kg)     Head and neck exam shows no ocular or oral lesions. She has no palpable  cervical or supraclavicular lymph nodes. Lungs are clear to percussion and auscultation bilaterally. Cardiac exam regular rate and rhythm with no murmurs, rubs or bruits. Abdomen is soft. She has good bowel sounds. There is no fluid wave. There is no palpable liver or spleen tip. Back exam shows no tenderness over the spine, ribs or hips. Extremities shows no clubbing, cyanosis or edema. Neurological exam shows no focal neurological deficits. Skin exam shows no rashes, ecchymoses or petechia.   Lab Results  Component Value Date   WBC 4.1 04/20/2017   HGB 11.8 04/20/2017   HCT 35.8 04/20/2017   MCV 80 (L) 04/20/2017   PLT 200 04/20/2017      Chemistry      Component Value Date/Time   NA 138 07/19/2016 1855   K 3.1 (L) 07/19/2016 1855   CL 102 07/19/2016 1855   CO2 24 07/19/2016 1855   BUN 5 (L) 07/19/2016 1855   CREATININE 0.88 07/19/2016 1855   CREATININE 0.89 03/20/2016 1434      Component Value Date/Time   CALCIUM 9.6 07/19/2016 1855   ALKPHOS 47 03/20/2016 1434   AST 16 03/20/2016 1434   ALT 6 03/20/2016 1434   BILITOT 0.6 03/20/2016 1434         Impression and Plan: Donna Simmons is a 76 year old African-American female with ethnic associated leukopenia. I looked at her blood smear today. I did not see anything that looked suspicious.  Everything really looks stable. As such, I do still think that we have to see her back. I just do not believe that there is any blood problem that we are dealing with. Again I suspect that she has ethnic associated leukopenia.  For now, we will let her go from the clinic. We will certainly see her back if she has any other issues.Josph Macho, MD 8/20/201811:56 AM

## 2017-05-13 ENCOUNTER — Ambulatory Visit (INDEPENDENT_AMBULATORY_CARE_PROVIDER_SITE_OTHER): Payer: Medicare Other | Admitting: Pharmacist

## 2017-05-13 VITALS — BP 138/78 | HR 58 | Wt 124.8 lb

## 2017-05-13 DIAGNOSIS — I10 Essential (primary) hypertension: Secondary | ICD-10-CM | POA: Diagnosis not present

## 2017-05-13 NOTE — Assessment & Plan Note (Signed)
Blood pressure of 138/78 today is at desire goal of <140/90. Noted 2 home readings provided by patient also within goal range. Patient denies dizziness, fatigue or head aches. Will continue current therapy without changes and follow up at HTN clinic as needed.

## 2017-05-13 NOTE — Progress Notes (Signed)
Patient ID: Donna Simmons                 DOB: July 08, 1941                      MRN: [id number]     HPI: Donna Simmons is a 76 y.o. female referred by Dr. Herbie Baltimore to HTN clinic. PMH includes A-Fib, hyperlipidemia and hypertension. Patient experienced some light-headedness in the past while taking lisinopril  plus metoprolol  plus amlodipine .  No changes to medication therapy were recommended during last HTN clinic follow up.  Patient presents today to HTN follow up. Denies dizziness, light-headedness or chest pain. She continues to exercise couple of time per week at the [facility]    Current HTN meds:  Lisinopril  daily Metoprolol succinate  daily Amlodipine  daily  BP goal: 140/90  Family History: Father died from HF at age 55, sister from MI at age 58  Social History: denies tobacco or alcohol use  Diet: Mostly home cooked meals, avoid adding salt to food, eats more fish and chicken  Exercise: walks 2 miles 3x/week, yoga weekly at [facility] BP readings: Only 2 readings available;  132/84 done by home RN visit; 137/71 on 9/12 at home  Wt Readings from Last 3 Encounters:  05/13/17 124 lb 12.8 oz (56.6 kg)  04/20/17 129 lb (58.5 kg)  02/02/17 131 lb 9.6 oz (59.7 kg)   BP Readings from Last 3 Encounters:  05/13/17 138/78  04/20/17 (!) 149/87  03/23/17 (!) 150/80   Pulse Readings from Last 3 Encounters:  05/13/17 (!) 58  04/20/17 68  03/23/17 64    Past Medical History:  Diagnosis Date  . Allergy   . GERD (gastroesophageal reflux disease)   . Heart murmur    No significant valvular lesion noted on echo.  . Hypercholesteremia   . Hypertension   . Palpitations 01/10/2009   14 day monitor- some sinus tachycardia, PVCs  . Paroxysmal atrial fibrillation (HCC) 12/27/2008    Current Outpatient Prescriptions on File Prior to Visit  Medication Sig Dispense Refill  . alum & mag hydroxide-simeth (MAALOX ADVANCED) 200-200-20 MG/5ML suspension Take  30 mLs by mouth every 6 (six) hours as needed for indigestion or heartburn. 355 mL 0  . amLODipine (NORVASC) 10 MG tablet Take 1 tablet (10 mg total) by mouth daily. 90 tablet 3  . atorvastatin (LIPITOR) 10 MG tablet TAKE ONE TABLET BY MOUTH ONCE DAILY WITH  LUNCH 90 tablet 3  . conjugated estrogens (PREMARIN) vaginal cream Place 0.5 Applicatorfuls vaginally 2 (two) times a week. 42.5 g 1  . ELIQUIS 5 MG TABS tablet TAKE ONE TABLET BY MOUTH TWICE DAILY 60 tablet 5  . flecainide (TAMBOCOR) 50 MG tablet Take 1 tablet (50 mg total) by mouth 2 (two) times daily. 180 tablet 3  . fluticasone (FLONASE) 50 MCG/ACT nasal spray USE TWO SPRAY(S) IN EACH NOSTRIL ONCE DAILY 16 g 1  . lisinopril (PRINIVIL,ZESTRIL) 20 MG tablet Take 20 mg by mouth daily.    . metoprolol succinate (TOPROL-XL) 25 MG 24 hr tablet TAKE ONE TABLET BY MOUTH ONCE DAILY BEFORE  DINNER,  MAY  TAKE  AN  ADDITIONAL  ONE-HALF  TABLET  6  HOURS  AFTER  AS  NEEDED 135 tablet 3  . Multiple Vitamin (MULTIVITAMIN WITH MINERALS) TABS tablet Take 1 tablet by mouth daily with lunch. Centrum Silver    . omeprazole (PRILOSEC) 20 MG capsule TAKE ONE  CAPSULE ONCE DAILY, THEN TAKE ONE TWICE DAILY FOR 14 DAYS EVERY 4 MONTHS 90 capsule 1  . sucralfate (CARAFATE) 1 GM/10ML suspension Take 10 mLs (1 g total) by mouth 4 (four) times daily -  with meals and at bedtime. 420 mL 0   No current facility-administered medications on file prior to visit.     Allergies  Allergen Reactions  . Antihistamines, Chlorpheniramine-Type Palpitations    Makes heart beat fast    Blood pressure 138/78, pulse (!) 58, weight 124 lb 12.8 oz (56.6 kg).  Essential hypertension Blood pressure of 138/78 today is at desire goal of <140/90. Noted 2 home readings provided by patient also within goal range. Patient denies dizziness, fatigue or head aches. Will continue current therapy without changes and follow up at HTN clinic as needed.   Harlyn Italiano Rodriguez-Guzman PharmD,  BCPS [facility] [address] [place] [phone] 05/13/2017 8:47 PM

## 2017-05-13 NOTE — Patient Instructions (Signed)
Return for a  follow up appointment as needed  Your blood pressure today is 138/78 pulse 58  Check your blood pressure at home daily (if able) and keep record of the readings.  Take your BP meds as follows: *ALL medication as previously prescribed*  Bring all of your meds, your BP cuff and your record of home blood pressures to your next appointment.  Exercise as you're able, try to walk approximately 30 minutes per day.  Keep salt intake to a minimum, especially watch canned and prepared boxed foods.  Eat more fresh fruits and vegetables and fewer canned items.  Avoid eating in fast food restaurants.    HOW TO TAKE YOUR BLOOD PRESSURE: . Rest 5 minutes before taking your blood pressure. .  Don't smoke or drink caffeinated beverages for at least 30 minutes before. . Take your blood pressure before (not after) you eat. . Sit comfortably with your back supported and both feet on the floor (don't cross your legs). . Elevate your arm to heart level on a table or a desk. . Use the proper sized cuff. It should fit smoothly and snugly around your bare upper arm. There should be enough room to slip a fingertip under the cuff. The bottom edge of the cuff should be 1 inch above the crease of the elbow. . Ideally, take 3 measurements at one sitting and record the average.

## 2017-05-18 ENCOUNTER — Ambulatory Visit: Payer: Medicare Other

## 2017-05-21 ENCOUNTER — Encounter: Payer: Self-pay | Admitting: Physician Assistant

## 2017-05-21 ENCOUNTER — Ambulatory Visit (INDEPENDENT_AMBULATORY_CARE_PROVIDER_SITE_OTHER): Payer: Medicare Other | Admitting: Physician Assistant

## 2017-05-21 VITALS — BP 146/82 | HR 76 | Temp 98.0°F | Resp 18 | Ht 63.5 in | Wt 126.8 lb

## 2017-05-21 DIAGNOSIS — Z23 Encounter for immunization: Secondary | ICD-10-CM | POA: Diagnosis not present

## 2017-05-21 DIAGNOSIS — Z13228 Encounter for screening for other metabolic disorders: Secondary | ICD-10-CM | POA: Diagnosis not present

## 2017-05-21 DIAGNOSIS — Z1329 Encounter for screening for other suspected endocrine disorder: Secondary | ICD-10-CM

## 2017-05-21 DIAGNOSIS — Z1231 Encounter for screening mammogram for malignant neoplasm of breast: Secondary | ICD-10-CM | POA: Diagnosis not present

## 2017-05-21 DIAGNOSIS — Z1321 Encounter for screening for nutritional disorder: Secondary | ICD-10-CM

## 2017-05-21 DIAGNOSIS — Z130 Encounter for screening for diseases of the blood and blood-forming organs and certain disorders involving the immune mechanism: Secondary | ICD-10-CM

## 2017-05-21 DIAGNOSIS — Z0000 Encounter for general adult medical examination without abnormal findings: Secondary | ICD-10-CM

## 2017-05-21 DIAGNOSIS — Z1239 Encounter for other screening for malignant neoplasm of breast: Secondary | ICD-10-CM

## 2017-05-21 MED ORDER — ZOSTER VAC RECOMB ADJUVANTED 50 MCG/0.5ML IM SUSR: 0.5000 mL | Freq: Once | INTRAMUSCULAR | 1 refills | Status: AC

## 2017-05-21 MED ORDER — ZOSTER VAC RECOMB ADJUVANTED 50 MCG/0.5ML IM SUSR: 0.5000 mL | Freq: Once | INTRAMUSCULAR | 1 refills | Status: DC

## 2017-05-21 NOTE — Patient Instructions (Signed)
     IF you received an x-ray today, you will receive an invoice from [facility]. Please contact [facility] at [phone] with questions or concerns regarding your invoice.   IF you received labwork today, you will receive an invoice from [facility]. Please contact [facility] at [phone] with questions or concerns regarding your invoice.   Our billing staff will not be able to assist you with questions regarding bills from these companies.  You will be contacted with the lab results as soon as they are available. The fastest way to get your results is to activate your My Chart account. Instructions are located on the last page of this paperwork. If you have not heard from us regarding the results in 2 weeks, please contact this office.    We recommend that you schedule a mammogram for breast cancer screening. Typically, you do not need a referral to do this. Please contact a local imaging center to schedule your mammogram.  [facility] - [phone]  *ask for the Radiology Department [facility]) - [phone] or [phone]  [facility] - [phone] [facility] - [phone] [facility] - [phone]  *ask for the Radiology Department  [facility] - [phone]  *ask for the Radiology Department [facility] - [phone]  *ask for the Mammography Department [facility] - [phone] 

## 2017-05-21 NOTE — Progress Notes (Signed)
05/21/2017 2:44 PM   DOB: Jan 26, 1941 / MRN: [id number]  SUBJECTIVE:  Donna Simmons is a 76 y.o. female presenting for annual exam. Most recent colonoscopy in 2017 by Dr. Man with one tubular adenoma and is being followed there.  Tells me that she is due for a mammogram which was negative in 01/2015. Sees cards for afib and is well managed on flecanide and toprol and is medication compliant.   Has a history of familial leukopenia. She denies vaginal bleeding today. She likes to do yard work for exercise and goes to [employer] and likes to walk there.  She also has to climb stairs at her house. Sleeps roughly 12 am and is able to sleeps through to 8:30. Tells me that she has no pain today. Takes no estrogen products. Recently had cataract surgery and tells me her vision is good today.    Immunization History  Administered Date(s) Administered  . Hepatitis A 03/22/1998  . Influenza Split 08/02/2012, 06/20/2014  . Influenza,inj,Quad PF,6+ Mos 07/21/2013, 06/04/2015, 06/21/2016  . Influenza-Unspecified 06/01/2014  . PPD Test 04/26/2013  . Pneumococcal Conjugate-13 08/29/2015  . Pneumococcal Polysaccharide-23 09/01/2005  . Td 03/22/1998, 07/31/2010  . Tdap 09/01/2009  . Zoster 08/02/2012     Depression screen PHQ 2/9 05/21/2017  Decreased Interest 0  Down, Depressed, Hopeless 0  PHQ - 2 Score 0      Current Outpatient Prescriptions:  .  alum & mag hydroxide-simeth (MAALOX ADVANCED) 200-200-20 MG/5ML suspension, Take 30 mLs by mouth every 6 (six) hours as needed for indigestion or heartburn., Disp: 355 mL, Rfl: 0 .  atorvastatin (LIPITOR) 10 MG tablet, TAKE ONE TABLET BY MOUTH ONCE DAILY WITH  LUNCH, Disp: 90 tablet, Rfl: 3 .  conjugated estrogens (PREMARIN) vaginal cream, Place 0.5 Applicatorfuls vaginally 2 (two) times a week., Disp: 42.5 g, Rfl: 1 .  ELIQUIS 5 MG TABS tablet, TAKE ONE TABLET BY MOUTH TWICE DAILY, Disp: 60 tablet, Rfl: 5 .  flecainide (TAMBOCOR) 50 MG tablet, Take 1  tablet (50 mg total) by mouth 2 (two) times daily., Disp: 180 tablet, Rfl: 3 .  fluticasone (FLONASE) 50 MCG/ACT nasal spray, USE TWO SPRAY(S) IN EACH NOSTRIL ONCE DAILY, Disp: 16 g, Rfl: 1 .  lisinopril (PRINIVIL,ZESTRIL) 20 MG tablet, Take 20 mg by mouth daily., Disp: , Rfl:  .  metoprolol succinate (TOPROL-XL) 25 MG 24 hr tablet, TAKE ONE TABLET BY MOUTH ONCE DAILY BEFORE  DINNER,  MAY  TAKE  AN  ADDITIONAL  ONE-HALF  TABLET  6  HOURS  AFTER  AS  NEEDED, Disp: 135 tablet, Rfl: 3 .  Multiple Vitamin (MULTIVITAMIN WITH MINERALS) TABS tablet, Take 1 tablet by mouth daily with lunch. Centrum Silver, Disp: , Rfl:  .  omeprazole (PRILOSEC) 20 MG capsule, TAKE ONE CAPSULE ONCE DAILY, THEN TAKE ONE TWICE DAILY FOR 14 DAYS EVERY 4 MONTHS, Disp: 90 capsule, Rfl: 1 .  sucralfate (CARAFATE) 1 GM/10ML suspension, Take 10 mLs (1 g total) by mouth 4 (four) times daily -  with meals and at bedtime., Disp: 420 mL, Rfl: 0 .  amLODipine (NORVASC) 10 MG tablet, Take 1 tablet (10 mg total) by mouth daily., Disp: 90 tablet, Rfl: 3 .  Zoster Vac Recomb Adjuvanted [facility]) injection, Inject 0.5 mLs into the muscle once. Booster (refill) due 6 months after initial injection., Disp: 1 each, Rfl: 1   She is allergic to antihistamines, chlorpheniramine-type.   She  has a past medical history of Allergy; GERD (gastroesophageal  reflux disease); Heart murmur; Hypercholesteremia; Hypertension; Palpitations (01/10/2009); and Paroxysmal atrial fibrillation (HCC) (12/27/2008).    She  reports that she has never smoked. She has never used smokeless tobacco. She reports that she does not drink alcohol or use drugs. She  reports that she currently engages in sexual activity. The patient  has a past surgical history that includes Breast surgery; Fracture surgery; Abdominal hysterectomy (1980); Cardiac catheterization (12/16/2010); transthoracic echocardiogram (April 2010); NM MYOVIEW LTD (April 2010); Eye surgery (Right, 12/29/2016); and  Eye surgery (Left, 01/19/2017).  Her family history includes Arthritis in her mother; Cancer in her maternal grandmother; Cancer - Lung in her brother and brother; Cancer - Prostate in her brother; Dementia in her mother and sister; Heart disease in her father and maternal grandfather; Hypertension in her brother, mother, and sister.  Review of Systems  Constitutional: Negative for chills, diaphoresis and fever.  Eyes: Negative.   Respiratory: Negative for shortness of breath.   Cardiovascular: Negative for chest pain, orthopnea and leg swelling.  Gastrointestinal: Negative for abdominal pain, blood in stool, constipation, diarrhea, heartburn, melena, nausea and vomiting.  Genitourinary: Negative for flank pain.  Skin: Negative for rash.  Neurological: Negative for dizziness, sensory change, speech change, focal weakness and headaches.    The problem list and medications were reviewed and updated by myself where necessary and exist elsewhere in the encounter.   OBJECTIVE:  BP (!) 146/82 (BP Location: Left Arm, Patient Position: Sitting, Cuff Size: Normal)   Pulse 76   Temp 98 F (36.7 C) (Oral)   Resp 18   Ht 5' 3.5" (1.613 m)   Wt 126 lb 12.8 oz (57.5 kg)   SpO2 100%   BMI 22.11 kg/m   Physical Exam  Constitutional: She is active.  Non-toxic appearance.  Cardiovascular: Normal rate, regular rhythm, S1 normal, S2 normal, normal heart sounds and intact distal pulses.  Exam reveals no gallop, no friction rub and no decreased pulses.   No murmur heard. Pulmonary/Chest: Effort normal. No stridor. No tachypnea. No respiratory distress. She has no wheezes. She has no rales.  Abdominal: She exhibits no distension.  Musculoskeletal: She exhibits no edema.  Neurological: She is alert.  Skin: Skin is warm and dry. She is not diaphoretic. No pallor.    Lab Results  Component Value Date   WBC 4.1 04/20/2017   HGB 11.8 04/20/2017   HCT 35.8 04/20/2017   MCV 80 (L) 04/20/2017   PLT 200  04/20/2017    Lab Results  Component Value Date   CREATININE 0.88 07/19/2016   BUN 5 (L) 07/19/2016   NA 138 07/19/2016   K 3.1 (L) 07/19/2016   CL 102 07/19/2016   CO2 24 07/19/2016    Lab Results  Component Value Date   ALT 6 03/20/2016   AST 16 03/20/2016   ALKPHOS 47 03/20/2016   BILITOT 0.6 03/20/2016    Lab Results  Component Value Date   TSH 0.903 07/19/2016    Lab Results  Component Value Date   HGBA1C 5.3 07/22/2013    Lab Results  Component Value Date   CHOL 230 (H) 08/29/2015   HDL 85 08/29/2015   LDLCALC 125 08/29/2015   TRIG 100 08/29/2015   CHOLHDL 2.7 08/29/2015    No results found for this or any previous visit (from the past 72 hour(s)).  No results found.  ASSESSMENT AND PLAN:  Anju was seen today for annual exam.  Diagnoses and all orders for this visit:  Annual  physical exam  Screening for endocrine, nutritional, metabolic and immunity disorder -     CBC -     Lipid panel -     TSH -     Hemoglobin A1c -     CMP and Liver -     Iron  Screening for breast cancer -     MM DIGITAL SCREENING BILATERAL; Future  Need for shingles vaccine -     Zoster Vac Recomb Adjuvanted [facility]) injection; Inject 0.5 mLs into the muscle once. Booster (refill) due 6 months after initial injection.  Needs flu shot -     Flu Vaccine QUAD 36+ mos IM    The patient is advised to call or return to clinic if she does not see an improvement in symptoms, or to seek the care of the closest emergency department if she worsens with the above plan.   Deliah Boston, MHS, PA-C Primary Care at [facility] 05/21/2017 2:44 PM

## 2017-05-22 LAB — CBC
HEMATOCRIT: 36.1 % (ref 34.0–46.6)
Hemoglobin: 11.6 g/dL (ref 11.1–15.9)
MCH: 25.4 pg — AB (ref 26.6–33.0)
MCHC: 32.1 g/dL (ref 31.5–35.7)
MCV: 79 fL (ref 79–97)
PLATELETS: 246 10*3/uL (ref 150–379)
RBC: 4.57 x10E6/uL (ref 3.77–5.28)
RDW: 16.2 % — AB (ref 12.3–15.4)
WBC: 3.7 10*3/uL (ref 3.4–10.8)

## 2017-05-22 LAB — LIPID PANEL
Chol/HDL Ratio: 2.9 ratio (ref 0.0–4.4)
Cholesterol, Total: 253 mg/dL — ABNORMAL HIGH (ref 100–199)
HDL: 86 mg/dL (ref >39)
LDL Calculated: 152 mg/dL — ABNORMAL HIGH (ref 0–99)
Triglycerides: 75 mg/dL (ref 0–149)
VLDL Cholesterol Cal: 15 mg/dL (ref 5–40)

## 2017-05-22 LAB — CMP AND LIVER
ALT: 8 IU/L (ref 0–32)
AST: 15 IU/L (ref 0–40)
Albumin: 4.3 g/dL (ref 3.5–4.8)
Alkaline Phosphatase: 60 IU/L (ref 39–117)
BUN: 9 mg/dL (ref 8–27)
Bilirubin Total: 0.5 mg/dL (ref 0.0–1.2)
Bilirubin, Direct: 0.16 mg/dL (ref 0.00–0.40)
CALCIUM: 9.2 mg/dL (ref 8.7–10.3)
CO2: 25 mmol/L (ref 20–29)
CREATININE: 0.92 mg/dL (ref 0.57–1.00)
Chloride: 103 mmol/L (ref 96–106)
GFR, EST AFRICAN AMERICAN: 71 mL/min/{1.73_m2} (ref >59)
GFR, EST NON AFRICAN AMERICAN: 62 mL/min/{1.73_m2} (ref >59)
GLUCOSE: 79 mg/dL (ref 65–99)
Potassium: 4.3 mmol/L (ref 3.5–5.2)
Sodium: 140 mmol/L (ref 134–144)
Total Protein: 6.6 g/dL (ref 6.0–8.5)

## 2017-05-22 LAB — HEMOGLOBIN A1C
ESTIMATED AVERAGE GLUCOSE: 100 mg/dL
HEMOGLOBIN A1C: 5.1 % (ref 4.8–5.6)

## 2017-05-22 LAB — TSH: TSH: 1.15 u[IU]/mL (ref 0.450–4.500)

## 2017-05-22 LAB — IRON: Iron: 71 ug/dL (ref 27–139)

## 2017-06-12 ENCOUNTER — Other Ambulatory Visit: Payer: Self-pay | Admitting: Nurse Practitioner

## 2017-06-12 NOTE — Telephone Encounter (Signed)
REFILL 

## 2017-06-14 ENCOUNTER — Emergency Department (HOSPITAL_COMMUNITY)
Admission: EM | Admit: 2017-06-14 | Discharge: 2017-06-14 | Disposition: A | Discharge status: Home / Self Care | Payer: Medicare Other | Attending: Emergency Medicine | Admitting: Emergency Medicine

## 2017-06-14 ENCOUNTER — Emergency Department (HOSPITAL_COMMUNITY): Payer: Medicare Other

## 2017-06-14 ENCOUNTER — Encounter (HOSPITAL_COMMUNITY): Payer: Self-pay | Admitting: Emergency Medicine

## 2017-06-14 DIAGNOSIS — I10 Essential (primary) hypertension: Secondary | ICD-10-CM | POA: Diagnosis present

## 2017-06-14 DIAGNOSIS — Z79899 Other long term (current) drug therapy: Secondary | ICD-10-CM | POA: Diagnosis not present

## 2017-06-14 DIAGNOSIS — Z7901 Long term (current) use of anticoagulants: Secondary | ICD-10-CM | POA: Diagnosis not present

## 2017-06-14 LAB — URINALYSIS, ROUTINE W REFLEX MICROSCOPIC
BILIRUBIN URINE: NEGATIVE
Bacteria, UA: NONE SEEN
Glucose, UA: NEGATIVE mg/dL
KETONES UR: NEGATIVE mg/dL
LEUKOCYTES UA: NEGATIVE
Nitrite: NEGATIVE
PH: 5 (ref 5.0–8.0)
PROTEIN: NEGATIVE mg/dL
SQUAMOUS EPITHELIAL / LPF: NONE SEEN
Specific Gravity, Urine: 1.006 (ref 1.005–1.030)

## 2017-06-14 LAB — BASIC METABOLIC PANEL
ANION GAP: 10 (ref 5–15)
BUN: 7 mg/dL (ref 6–20)
CALCIUM: 9.1 mg/dL (ref 8.9–10.3)
CO2: 25 mmol/L (ref 22–32)
CREATININE: 0.85 mg/dL (ref 0.44–1.00)
Chloride: 101 mmol/L (ref 101–111)
GLUCOSE: 96 mg/dL (ref 65–99)
Potassium: 3.5 mmol/L (ref 3.5–5.1)
Sodium: 136 mmol/L (ref 135–145)

## 2017-06-14 LAB — I-STAT TROPONIN, ED: Troponin i, poc: 0 ng/mL (ref 0.00–0.08)

## 2017-06-14 LAB — CBC
HCT: 36.6 % (ref 36.0–46.0)
Hemoglobin: 12.1 g/dL (ref 12.0–15.0)
MCH: 26 pg (ref 26.0–34.0)
MCHC: 33.1 g/dL (ref 30.0–36.0)
MCV: 78.5 fL (ref 78.0–100.0)
PLATELETS: 224 10*3/uL (ref 150–400)
RBC: 4.66 MIL/uL (ref 3.87–5.11)
RDW: 15 % (ref 11.5–15.5)
WBC: 5.2 10*3/uL (ref 4.0–10.5)

## 2017-06-14 LAB — MAGNESIUM: Magnesium: 1.9 mg/dL (ref 1.7–2.4)

## 2017-06-14 MED ORDER — METOPROLOL TARTRATE 5 MG/5ML IV SOLN: 5.0000 mg | Freq: Once | INTRAVENOUS | Status: DC

## 2017-06-14 NOTE — ED Notes (Signed)
Pt went to x-ray.

## 2017-06-14 NOTE — ED Notes (Signed)
Patient walked to bathroom with no assistance and placed back on cardiac monitor upon return to D36.

## 2017-06-14 NOTE — Discharge Instructions (Signed)
Continue to take all blood pressure medications as prescribed.  Please follow-up with your primary care physician or cardiologist for reevaluation of your blood pressure in 2-3 days.  Return to the ED as needed.

## 2017-06-14 NOTE — ED Provider Notes (Signed)
[facility]-EMERGENCY DEPT Provider Note   CSN: [id number] Arrival date & time: 06/14/17  4098  History   Chief Complaint Chief Complaint  Patient presents with  . Hypertension   HPI Donna Simmons is a 76 y.o. female.  The patient is a 76 year old female with a past medical history significant for atrial fibrillation on Eliquis, hypertension, cardiac murmur, hypercholesterolemia, and GERD, who presents to the ED complaining of hypertension. The patient evaluates her blood pressure twice weekly. Today, her blood pressure was higher than normal, so she decided to come to the ED for further evaluation.  She denies headache, chest pain, or shortness of breath prior to ED arrival, however she also reports an episode of palpitations earlier today associated with dizziness and lightheadedness that resolved spontaneously. She has been compliant with all of her home medications.  She has no symptoms at this time aside from urinary frequency, which has been present for the past 5 days.   The history is provided by the patient and medical records. No language interpreter was used.    Past Medical History:  Diagnosis Date  . Allergy   . GERD (gastroesophageal reflux disease)   . Heart murmur    No significant valvular lesion noted on echo.  . Hypercholesteremia   . Hypertension   . Palpitations 01/10/2009   14 day monitor- some sinus tachycardia, PVCs  . Paroxysmal atrial fibrillation (HCC) 12/27/2008   Patient Active Problem List   Diagnosis Date Noted  . Allergic dermatitis 02/02/2017  . Dysphagia 11/05/2015  . Long term current use of anticoagulant therapy 11/18/2012  . Hypercholesteremia 09/25/2012  . DDD (degenerative disc disease), cervical 05/19/2012  . Paroxysmal atrial fibrillation (HCC) 04/20/2012  . Essential hypertension 04/20/2012  . Palpitations 01/10/2009   Past Surgical History:  Procedure Laterality Date  . ABDOMINAL HYSTERECTOMY  1980  . BREAST SURGERY    . CARDIAC  CATHETERIZATION  12/16/2010   no evidence of CAD to explain anginal pain w/ positive troponin.  potential etiology is breakthrough AF  . EYE SURGERY Right 12/29/2016  . EYE SURGERY Left 01/19/2017  . FRACTURE SURGERY    . NM MYOVIEW LTD  April 2010   EF 64%, normal pattern of perfusion in all regions, no scintigraphic evidence of inducible ischemia; no significant wall motion abnormalities; EKG negative for ischemia; no significant change from last study; low risk scan  . TRANSTHORACIC ECHOCARDIOGRAM  April 2010   Echo - EF >55; RV mils/mod dilated, RV systolic function mildly reduced; RA mild/moderately dilated; moderate pulmonary hypertension; mild tricuspid regurgitation   OB History    No data available     Home Medications    Prior to Admission medications   Medication Sig Start Date End Date Taking? Authorizing Provider  alum & mag hydroxide-simeth (MAALOX ADVANCED) 200-200-20 MG/5ML suspension Take 30 mLs by mouth every 6 (six) hours as needed for indigestion or heartburn. 07/19/16   Shaune Pollack, MD  amLODipine (NORVASC) 10 MG tablet Take 1 tablet (10 mg total) by mouth daily. 10/06/16 01/04/17  Lewayne Bunting, MD  atorvastatin (LIPITOR) 10 MG tablet TAKE ONE TABLET BY MOUTH ONCE DAILY WITH  LUNCH 06/02/16   Marykay Lex, MD  conjugated estrogens (PREMARIN) vaginal cream Place 0.5 Applicatorfuls vaginally 2 (two) times a week. 12/20/15   Linna Hoff, MD  ELIQUIS 5 MG TABS tablet TAKE ONE TABLET BY MOUTH TWICE DAILY 02/24/17   Marykay Lex, MD  flecainide (TAMBOCOR) 50 MG tablet Take 1 tablet (50  mg total) by mouth 2 (two) times daily. 04/04/16   Ok Anis, NP  fluticasone (FLONASE) 50 MCG/ACT nasal spray USE TWO SPRAY(S) IN EACH NOSTRIL ONCE DAILY 01/05/17   [name], MD  lisinopril (PRINIVIL,ZESTRIL) 20 MG tablet TAKE ONE TABLET BY MOUTH ONCE DAILY 06/12/17   Ok Anis, NP  metoprolol succinate (TOPROL-XL) 25 MG 24 hr tablet TAKE ONE TABLET BY  MOUTH ONCE DAILY BEFORE  DINNER,  MAY  TAKE  AN  ADDITIONAL  ONE-HALF  TABLET  6  HOURS  AFTER  AS  NEEDED 11/25/16   Marykay Lex, MD  Multiple Vitamin (MULTIVITAMIN WITH MINERALS) TABS tablet Take 1 tablet by mouth daily with lunch. Centrum Silver    [provider]  omeprazole (PRILOSEC) 20 MG capsule TAKE ONE CAPSULE ONCE DAILY, THEN TAKE ONE TWICE DAILY FOR 14 DAYS EVERY 4 MONTHS 07/22/16   [name], MD  sucralfate (CARAFATE) 1 GM/10ML suspension Take 10 mLs (1 g total) by mouth 4 (four) times daily -  with meals and at bedtime. 06/03/16   [name], MD   Family History Family History  Problem Relation Age of Onset  . Arthritis Mother        OSTEO  . Hypertension Mother   . Dementia Mother   . Heart disease Father   . Hypertension Sister   . Dementia Sister   . Cancer - Lung Brother   . Cancer Maternal Grandmother   . Heart disease Maternal Grandfather   . Cancer - Lung Brother   . Hypertension Brother   . Cancer - Prostate Brother    Social History Social History  Substance Use Topics  . Smoking status: Never Smoker  . Smokeless tobacco: Never Used  . Alcohol use No   Allergies   Antihistamines, chlorpheniramine-type  Review of Systems Review of Systems  Constitutional: Negative for chills and fever.  Eyes: Negative for visual disturbance.  Respiratory: Negative for shortness of breath.   Cardiovascular: Positive for palpitations. Negative for chest pain and leg swelling.  Gastrointestinal: Negative for abdominal pain, diarrhea, nausea and vomiting.  Genitourinary: Positive for frequency. Negative for flank pain.  Musculoskeletal: Negative.   Skin: Negative.   Allergic/Immunologic: Negative for immunocompromised state.  Neurological: Positive for dizziness and light-headedness.  Hematological: Negative.   Psychiatric/Behavioral: Negative.    Physical Exam Updated Vital Signs BP (!) 149/75   Pulse 70   Temp 98.2 F (36.8 C) (Oral)    Resp 15   Ht  (1.626 m)   Wt 54.4 kg (120 lb)   SpO2 100%   BMI 20.60 kg/m   Physical Exam  Constitutional: She is oriented to person, place, and time. She appears well-developed and well-nourished. No distress.  Alert, elderly female in no acute distress, sitting on the edge of the bed  HENT:  Head: Normocephalic and atraumatic.  Eyes: Conjunctivae and EOM are normal.  Neck: Neck supple.  Cardiovascular: Normal rate, regular rhythm and intact distal pulses.   Pulmonary/Chest: Effort normal and breath sounds normal. No stridor. No respiratory distress. She has no wheezes.  Abdominal: Soft. She exhibits no mass. There is tenderness (suprapubic region). There is no guarding.  Musculoskeletal: She exhibits no edema.  Neurological: She is alert and oriented to person, place, and time.  Skin: Skin is warm and dry.  Psychiatric: She has a normal mood and affect. Her behavior is normal. Judgment and thought content normal.  pleasant  Nursing note and vitals  reviewed.  ED Treatments / Results  Labs (all labs ordered are listed, but only abnormal results are displayed) Labs Reviewed  URINALYSIS, ROUTINE W REFLEX MICROSCOPIC - Abnormal; Notable for the following:       Result Value   Color, Urine STRAW (*)    Hgb urine dipstick MODERATE (*)    All other components within normal limits  CBC  BASIC METABOLIC PANEL  MAGNESIUM  I-STAT TROPONIN, ED  I-STAT TROPONIN, ED    EKG  EKG Interpretation None      Radiology Dg Chest 2 View  Result Date: 06/14/2017 CLINICAL DATA:  Hypertension. EXAM: CHEST  2 VIEW COMPARISON:  09/25/2015 FINDINGS: The lungs are clear except for stable linear scarring in the left base. Normal heart size. Normal pulmonary vasculature. No pleural effusions. Normal hilar and mediastinal contours. IMPRESSION: No active cardiopulmonary disease. Electronically Signed   By: Ellery Plunk M.D.   On: 06/14/2017 21:34    Procedures Procedures (including  critical care time)  Medications Ordered in ED Medications - No data to display  Initial Impression / Assessment and Plan / ED Course  I have reviewed the triage vital signs and the nursing notes.  Pertinent labs & imaging results that were available during my care of the patient were reviewed by me and considered in my medical decision making (see chart for details).    At the time of my initial exam, the patient's blood pressure was 189/95 with a normal heart rate.  Initial differential diagnosis included hypertensive emergency, hypertensive urgency, hypertension, ACS, UTI, pyelonephritis, pneumonia, and pleural effusion.  I elected to perform labs given the patient's episode of palpitations with associated dizziness and lightheadedness earlier this afternoon. Pertinent labs included CBC without leukocytosis, anemia, or platelet count abnormalities.  BMP unremarkable. Magnesium normal.  UA without evidence of infection.  Troponin normal.  EKG with a regular rate and normal sinus rhythm.  Normal PR, narrow QRS, and normal QTc.No T-wave or ST changes suggestive of ischemia or infarct.  Imaging studies included a chest x-ray with no acute cardiopulmonary abnormalities.    Upon reassessment, the patient was symptom-free. I initially ordered antihypertensives, however her blood pressure spontaneously decreased and medications were not indicated.  She had no return of palpitations and did not feel dizzy, short of breath, or lightheaded.  Based on the above findings, I suspect the patient's episode of palpitations may have been an intermittent period of atrial fibrillation.  ACS unlikely at this time given the absence of troponin anemia or EKG changes. She does not have a UTI at present, nor does she have pyelonephritis.  I provided reassurance to the patient at this time.  She was instructed to continue taking all home antihypertensives as prescribed.  I discussed the above results with the patient who  verbalized understanding.  Return precautions and follow-up plans discussed including close follow-up with her primary care physician.  The patient was discharged in stable condition.  Final Clinical Impressions(s) / ED Diagnoses   Final diagnoses:  Hypertension, unspecified type   New Prescriptions Discharge Medication List as of 06/14/2017 11:10 PM       Levester Fresh, MD 06/15/17 [id number]    Pricilla Loveless, MD 06/17/17 1427

## 2017-06-14 NOTE — ED Triage Notes (Signed)
Pt was at home and took her blood pressure as she always does on Sunday and found it to be higher than normal 194/104.  She stated she wanted to be checked out to make sure she was OK.

## 2017-06-14 NOTE — ED Notes (Signed)
emt currently drawing labs. 

## 2017-06-18 ENCOUNTER — Other Ambulatory Visit: Payer: Self-pay | Admitting: Physician Assistant

## 2017-06-18 DIAGNOSIS — Z1231 Encounter for screening mammogram for malignant neoplasm of breast: Secondary | ICD-10-CM

## 2017-07-07 ENCOUNTER — Ambulatory Visit
Admission: RE | Admit: 2017-07-07 | Discharge: 2017-07-07 | Disposition: A | Discharge status: Home / Self Care | Payer: Medicare Other | Source: Ambulatory Visit | Attending: Physician Assistant | Admitting: Physician Assistant

## 2017-07-07 DIAGNOSIS — Z1231 Encounter for screening mammogram for malignant neoplasm of breast: Secondary | ICD-10-CM

## 2017-07-08 ENCOUNTER — Other Ambulatory Visit: Payer: Self-pay | Admitting: Nurse Practitioner

## 2017-07-15 ENCOUNTER — Telehealth: Payer: Self-pay

## 2017-07-15 NOTE — Telephone Encounter (Signed)
Called pt to reschedule AWV, due to inclement weather when she scheduled in September. Patient reports that her insurance carrier came out and completed AWV.   Sherle PoeNicole Amarie Viles, B.A.  Care Guide - [facility] at [facility] [phone]

## 2017-07-20 ENCOUNTER — Ambulatory Visit: Payer: Medicare Other

## 2017-08-03 ENCOUNTER — Ambulatory Visit: Payer: Medicare Other | Admitting: Cardiology

## 2017-08-03 ENCOUNTER — Encounter: Payer: Self-pay | Admitting: Cardiology

## 2017-08-03 VITALS — BP 140/84 | HR 78 | Ht 64.0 in | Wt 124.2 lb

## 2017-08-03 DIAGNOSIS — I10 Essential (primary) hypertension: Secondary | ICD-10-CM

## 2017-08-03 DIAGNOSIS — R002 Palpitations: Secondary | ICD-10-CM | POA: Diagnosis not present

## 2017-08-03 DIAGNOSIS — E7800 Pure hypercholesterolemia, unspecified: Secondary | ICD-10-CM | POA: Diagnosis not present

## 2017-08-03 DIAGNOSIS — I480 Paroxysmal atrial fibrillation: Secondary | ICD-10-CM

## 2017-08-03 MED ORDER — METOPROLOL SUCCINATE ER 50 MG PO TB24: 50.0000 mg | ORAL_TABLET | Freq: Every day | ORAL | 3 refills | Status: DC

## 2017-08-03 MED ORDER — CLONIDINE HCL 0.1 MG PO TABS: ORAL_TABLET | ORAL | 11 refills | Status: DC

## 2017-08-03 NOTE — Assessment & Plan Note (Signed)
Relatively well controlled on current regimen.  We are now increasing beta blood should do well for controlling episodes anymore.

## 2017-08-03 NOTE — Assessment & Plan Note (Signed)
Labs followed by PCP

## 2017-08-03 NOTE — Assessment & Plan Note (Signed)
Still borderline elevated today.  Has also had those breakthrough episodes of hypertension. Plan: Increase Toprol to 50 mg daily. We will add as needed clonidine 0.1 mg for SBP greater than 170 mmHg.

## 2017-08-03 NOTE — Progress Notes (Signed)
PCP: Ofilia Neaslark, Michael L, PA-C  Clinic Note: Chief Complaint  Patient presents with  . Follow-up    NO CHEST PAIN  . Atrial Fibrillation    Paroxysmal  . Hypertension    Recent episode of accelerated hypertension    HPI: Donna Simmons is a 76 y.o. female with a PMH below who presents today for 1294-month follow-up for hypertension and atrial fibrillation She's had several frequent episodes of PACs and PVCs. She also has documented PAF on ELIQUIS and flecainide. Her last ER visit for A. fib was in November 2017. By time she arrived she is back in sinus rhythm. This is the only time she notes chest discomfort is when she is actually in A. fib.  Donna Simmons was last seen in May 2018 she had had a couple episodes over the last few days where she woke up feeling her heart pounding.  Episodes lasted maybe 30 seconds but did not feel like A. fib.  Recent Hospitalizations: None  Studies Personally Reviewed - if available, images/films reviewed: From Epic Chart or Care Everywhere  None  Interval History: Donna DandyMary presented today for follow-up doing quite well.  She really has not noticed that much in the way of any significant palpitations since I last saw her.  What she indicates is that about 2 months ago she had a spell where her blood pressure went up in the 200s and lasted like that for several hours.  She felt funny with a little bit dizzy and lightheaded, but had no syncope.  She did have a little bit of a headache but no blurred vision.  She has not had another episode but has had some episodes where her blood pressures have been a little bit higher than usual.  Denied having any chest pain associated with it.  No palpitations or pounding heartbeats.  Nothing to suggest a rapid irregular heartbeats of A. fib. No TIA or amaurosis fugax symptoms. No resting or exertional chest tightness or pressure.  No PND, orthopnea or edema. No melena, hematochezia, hematuria.  No claudication..      ROS: A comprehensive was performed. All pertinent positives and negatives noted above. Review of Systems  Constitutional: Negative for malaise/fatigue.  HENT: Negative for nosebleeds.   Respiratory: Negative for shortness of breath.   Gastrointestinal: Negative for abdominal pain, blood in stool and melena.  Genitourinary: Negative for hematuria.  Musculoskeletal: Negative for falls, joint pain and myalgias.  Neurological: Positive for dizziness (Occasional orthostatic). Negative for headaches.  Psychiatric/Behavioral: The patient is not nervous/anxious (When she started having these episodes).   All other systems reviewed and are negative.   I have reviewed and (if needed) personally updated the patient's problem list, medications, allergies, past medical and surgical history, social and family history.   Past Medical History:  Diagnosis Date  . Allergy   . GERD (gastroesophageal reflux disease)   . Heart murmur    No significant valvular lesion noted on echo.  . Hypercholesteremia   . Hypertension   . Palpitations 01/10/2009   14 day monitor- some sinus tachycardia, PVCs  . Paroxysmal atrial fibrillation (HCC) 12/27/2008    Past Surgical History:  Procedure Laterality Date  . ABDOMINAL HYSTERECTOMY  1980  . BREAST SURGERY    . CARDIAC CATHETERIZATION  12/16/2010   no evidence of CAD to explain anginal pain w/ positive troponin.  potential etiology is breakthrough AF  . EYE SURGERY Right 12/29/2016  . EYE SURGERY Left 01/19/2017  . FRACTURE  SURGERY    . NM MYOVIEW LTD  April 2010   EF 64%, normal pattern of perfusion in all regions, no scintigraphic evidence of inducible ischemia; no significant wall motion abnormalities; EKG negative for ischemia; no significant change from last study; low risk scan  . TRANSTHORACIC ECHOCARDIOGRAM  April 2010   Echo - EF >55; RV mils/mod dilated, RV systolic function mildly reduced; RA mild/moderately dilated; moderate pulmonary  hypertension; mild tricuspid regurgitation    Current Meds  Medication Sig  . amLODipine (NORVASC) 10 MG tablet Take 1 tablet (10 mg total) by mouth daily.  Marland Kitchen. atorvastatin (LIPITOR) 10 MG tablet TAKE ONE TABLET BY MOUTH ONCE DAILY WITH  LUNCH  . conjugated estrogens (PREMARIN) vaginal cream Place 0.5 Applicatorfuls vaginally 2 (two) times a week.  Marland Kitchen. ELIQUIS 5 MG TABS tablet TAKE ONE TABLET BY MOUTH TWICE DAILY  . flecainide (TAMBOCOR) 50 MG tablet TAKE ONE TABLET BY MOUTH TWICE DAILY  . fluticasone (FLONASE) 50 MCG/ACT nasal spray USE TWO SPRAY(S) IN EACH NOSTRIL ONCE DAILY  . lisinopril (PRINIVIL,ZESTRIL) 20 MG tablet TAKE ONE TABLET BY MOUTH ONCE DAILY  . Multiple Vitamin (MULTIVITAMIN WITH MINERALS) TABS tablet Take 1 tablet by mouth daily with lunch. Centrum Silver  . omeprazole (PRILOSEC) 20 MG capsule TAKE ONE CAPSULE ONCE DAILY, THEN TAKE ONE TWICE DAILY FOR 14 DAYS EVERY 4 MONTHS  . RaNITidine HCl (ZANTAC PO) Take 1 tablet by mouth daily. Patient not sure of dosage  . sucralfate (CARAFATE) 1 GM/10ML suspension Take 10 mLs (1 g total) by mouth 4 (four) times daily -  with meals and at bedtime.  . [DISCONTINUED] metoprolol succinate (TOPROL-XL) 25 MG 24 hr tablet TAKE ONE TABLET BY MOUTH ONCE DAILY BEFORE  DINNER,  MAY  TAKE  AN  ADDITIONAL  ONE-HALF  TABLET  6  HOURS  AFTER  AS  NEEDED    Allergies  Allergen Reactions  . Antihistamines, Chlorpheniramine-Type Palpitations    Makes heart beat fast    Social History   Socioeconomic History  . Marital status: Married    Spouse name: None  . Number of children: None  . Years of education: None  . Highest education level: None  Social Needs  . Financial resource strain: None  . Food insecurity - worry: None  . Food insecurity - inability: None  . Transportation needs - medical: None  . Transportation needs - non-medical: None  Occupational History  . None  Tobacco Use  . Smoking status: Never Smoker  . Smokeless tobacco:  Never Used  Substance and Sexual Activity  . Alcohol use: No    Alcohol/week: 0.0 oz  . Drug use: No  . Sexual activity: Yes  Other Topics Concern  . None  Social History Narrative   Married with no children. Walks several times a week at least 3-4 times a week about 2 miles a time. Does not smoke and does not drink.    family history includes Arthritis in her mother; Cancer in her maternal grandmother; Cancer - Lung in her brother and brother; Cancer - Prostate in her brother; Dementia in her mother and sister; Heart disease in her father and maternal grandfather; Hypertension in her brother, mother, and sister.  Wt Readings from Last 3 Encounters:  08/03/17 124 lb 3.2 oz (56.3 kg)  06/14/17 120 lb (54.4 kg)  05/21/17 126 lb 12.8 oz (57.5 kg)    PHYSICAL EXAM BP 140/84   Pulse 78   Ht 5\' 4"  (1.626 m)  Wt 124 lb 3.2 oz (56.3 kg)   SpO2 99%   BMI 21.32 kg/m   Physical Exam  Constitutional: She is oriented to person, place, and time. She appears well-developed and well-nourished. No distress.  Well-groomed.  Healthy-appearing  HENT:  Head: Normocephalic and atraumatic.  Eyes: EOM are normal.  Neck: No hepatojugular reflux and no JVD present. Carotid bruit is not present.  Cardiovascular: Normal rate, regular rhythm, normal heart sounds and intact distal pulses. Exam reveals no gallop and no friction rub.  No murmur heard. Nondisplaced PMI  Pulmonary/Chest: Effort normal and breath sounds normal. No respiratory distress. She has no wheezes. She has no rales.  Abdominal: Bowel sounds are normal. She exhibits no distension. There is no tenderness. There is no rebound.  Musculoskeletal: Normal range of motion. She exhibits deformity. She exhibits no edema.  Neurological: She is alert and oriented to person, place, and time.  Skin: Skin is warm and dry. No erythema.  Psychiatric: Her behavior is normal. Judgment and thought content normal.  Pleasant mood and affect.  Nursing  note and vitals reviewed.   Adult ECG Report Not checked   Other studies Reviewed: Additional studies/ records that were reviewed today include:  Recent Labs:  n/a     ASSESSMENT / PLAN: Problem List Items Addressed This Visit    Essential hypertension - Primary (Chronic)    Still borderline elevated today.  Has also had those breakthrough episodes of hypertension. Plan: Increase Toprol to 50 mg daily. We will add as needed clonidine 0.1 mg for SBP greater than 170 mmHg.      Relevant Medications   metoprolol succinate (TOPROL-XL) 50 MG 24 hr tablet   cloNIDine (CATAPRES) 0.1 MG tablet   Hypercholesteremia (Chronic)    Labs followed by PCP.      Relevant Medications   metoprolol succinate (TOPROL-XL) 50 MG 24 hr tablet   cloNIDine (CATAPRES) 0.1 MG tablet   Palpitations (Chronic)    Relatively well controlled on current regimen.  We are now increasing beta blood should do well for controlling episodes anymore.      Paroxysmal atrial fibrillation (HCC) (Chronic)    Stable on current dose of flecainide plus Toprol.  We are increasing Toprol for more rate control and blood pressure. No bleeding on Eliquis. This patients CHA2DS2-VASc Score and unadjusted Ischemic Stroke Rate (% per year) is equal to 3.2 % stroke rate/year from a score of 3  Above score calculated as 1 point each if present [CHF, HTN, DM, Vascular=MI/PAD/Aortic Plaque, Age if 41-74, or Female]; 2 points each if present [Age > 75, or Stroke/TIA/TE]        Relevant Medications   metoprolol succinate (TOPROL-XL) 50 MG 24 hr tablet   cloNIDine (CATAPRES) 0.1 MG tablet      Current medicines are reviewed at length with the patient today. (+/- concerns) n/a The following changes have been made: -- no change.  Patient Instructions  Medication Instruction  INCREASE Metoprolol ER 50 MG  ONE TABLET DAILY, may take an extra 1/2 tablet if needed    If systolic  blood pressure is > 161 , May take Clonidine  0.1 mg tablet .     Your physician wants you to follow-up in: 6 months with DR Blakelee Allington. You will receive a reminder letter in the mail two months in advance. If you don't receive a letter, please call our office to schedule the follow-up appointment.    If you need a refill on your cardiac  medications before your next appointment, please call your pharmacy.    Studies Ordered:   No orders of the defined types were placed in this encounter.     Bryan Lemma, M.D., M.S. Interventional Cardiologist   Pager # [phone] Phone # [phone] [address] [place], [place] [address]

## 2017-08-03 NOTE — Patient Instructions (Addendum)
Medication Instruction  INCREASE Metoprolol ER 50 MG  ONE TABLET DAILY, may take an extra 1/2 tablet if needed    If systolic  blood pressure is > 914170 , May take Clonidine 0.1 mg tablet .     Your physician wants you to follow-up in: 6 months with DR HARDING. You will receive a reminder letter in the mail two months in advance. If you don't receive a letter, please call our office to schedule the follow-up appointment.    If you need a refill on your cardiac medications before your next appointment, please call your pharmacy.

## 2017-08-03 NOTE — Assessment & Plan Note (Signed)
Stable on current dose of flecainide plus Toprol.  We are increasing Toprol for more rate control and blood pressure. No bleeding on Eliquis. This patients CHA2DS2-VASc Score and unadjusted Ischemic Stroke Rate (% per year) is equal to 3.2 % stroke rate/year from a score of 3  Above score calculated as 1 point each if present [CHF, HTN, DM, Vascular=MI/PAD/Aortic Plaque, Age if 65-74, or Female]; 2 points each if present [Age > 75, or Stroke/TIA/TE]

## 2017-08-05 ENCOUNTER — Encounter: Payer: Self-pay | Admitting: Cardiology

## 2017-09-08 ENCOUNTER — Other Ambulatory Visit: Payer: Self-pay | Admitting: Cardiology

## 2017-10-28 ENCOUNTER — Telehealth: Payer: Self-pay | Admitting: Cardiology

## 2017-10-28 ENCOUNTER — Other Ambulatory Visit: Payer: Self-pay | Admitting: Cardiology

## 2017-10-28 MED ORDER — APIXABAN 5 MG PO TABS: 5.0000 mg | ORAL_TABLET | Freq: Two times a day (BID) | ORAL | 1 refills | Status: DC

## 2017-10-28 NOTE — Telephone Encounter (Signed)
Pt called stating she dropped her Rx bottle of Eliquis in the toilet this evening and has no refills. She called the Pharmacy but worried insurance may not fill. Instructed I will call in another Rx. If unable to fill tonight asked that she call back in am to the office to request assistance and may need samples. Patient understood and thanked me for follow up.  Laverda PageLindsay Roberts NP

## 2017-10-29 ENCOUNTER — Telehealth: Payer: Self-pay | Admitting: Cardiology

## 2017-10-29 NOTE — Telephone Encounter (Signed)
Patient aware samples are at the front desk for pick up  

## 2017-10-29 NOTE — Telephone Encounter (Signed)
New Message     Patient calling the office for samples of medication:   1.  What medication and dosage are you requesting samples for? Eliquis   2.  Are you currently out of this medication?  Yes dropped medication in the toilet, insurance will not cover a refill, they said she has to wait until March

## 2017-11-03 ENCOUNTER — Ambulatory Visit: Payer: Medicare Other | Admitting: Family Medicine

## 2017-11-03 ENCOUNTER — Encounter: Payer: Self-pay | Admitting: Family Medicine

## 2017-11-03 VITALS — BP 140/82 | HR 69 | Temp 97.6°F | Resp 18 | Ht 64.0 in | Wt 122.0 lb

## 2017-11-03 DIAGNOSIS — Z1321 Encounter for screening for nutritional disorder: Secondary | ICD-10-CM

## 2017-11-03 DIAGNOSIS — K219 Gastro-esophageal reflux disease without esophagitis: Secondary | ICD-10-CM

## 2017-11-03 DIAGNOSIS — R232 Flushing: Secondary | ICD-10-CM | POA: Diagnosis not present

## 2017-11-03 DIAGNOSIS — R208 Other disturbances of skin sensation: Secondary | ICD-10-CM

## 2017-11-03 DIAGNOSIS — Z79899 Other long term (current) drug therapy: Secondary | ICD-10-CM | POA: Diagnosis not present

## 2017-11-03 LAB — GLUCOSE, POCT (MANUAL RESULT ENTRY): POC GLUCOSE: 93 mg/dL (ref 70–99)

## 2017-11-03 NOTE — Patient Instructions (Addendum)
  I will check bloodwork as discussed, but please follow up in 1 week to discuss those results and to decide if other testing or referral needed. Return to the clinic or go to the nearest emergency room if any of your symptoms worsen or new symptoms occur.     IF you received an x-ray today, you will receive an invoice from [facility]. Please contact [facility] at [phone] with questions or concerns regarding your invoice.   IF you received labwork today, you will receive an invoice from [facility]. Please contact [facility] at [phone] with questions or concerns regarding your invoice.   Our billing staff will not be able to assist you with questions regarding bills from these companies.  You will be contacted with the lab results as soon as they are available. The fastest way to get your results is to activate your My Chart account. Instructions are located on the last page of this paperwork. If you have not heard from us regarding the results in 2 weeks, please contact this office.

## 2017-11-03 NOTE — Progress Notes (Signed)
Subjective:  By signing my name below, I, [name], attest that this documentation has been prepared under the direction and in the presence of [name], MD Electronically Signed: Charline Bills, ED Scribe 11/03/2017 at 9:53 AM.   Patient ID: Donna Simmons, female    DOB: 1941-03-04, 77 y.o.   MRN: [id number]  Chief Complaint  Patient presents with  . Gastroesophageal Reflux  . Medication Reaction    *Main concern* "Prickling feeling" in fingers and toes bilateral due to Eliquis per pt.  . Labs Only    Magnesium, Potassium (patients states she has been having tremors and would like to check if low potassium/magnsium are the caue)   HPI Donna Simmons is a 77 y.o. female who presents to Primary Care at [facility] multiple concerns. I have not seen her since 11/2016. She was seen by Deliah Boston her PCP in Sept. On 2 meds for HTN, a-fib, reflux and omeprazole 20 mg qd. Complaining of dysesthesia in fingers and toes. Did have normal magnesium in Oct, normal iron in Sept. Last TSH normal in Sept. Normal A1C in Sept, normal glucose in Oct.  Prickling Sensation Pt reports a prickling sensation in her fingertips and toes x 3 days. Reports h/o same which she states was due to Eliquis. Pt takes omeprazole every 2 wks at a time every 4 months. She is currently on the last dose of her 2 wk dose. Denies tingling in tongue/mouth, weakness in extremities, HA, feeling off-balance.  Shakiness Pt has also noticed shakiness in her hands over the past few days. Reports that she occasionally feels a little flushed as well. Denies seizure-like activities, bladder/bowel incontinence, LOC, cp, sob.  Patient Active Problem List   Diagnosis Date Noted  . Allergic dermatitis 02/02/2017  . Dysphagia 11/05/2015  . Long term current use of anticoagulant therapy 11/18/2012  . Hypercholesteremia 09/25/2012  . DDD (degenerative disc disease), cervical 05/19/2012  . Paroxysmal atrial fibrillation (HCC)  04/20/2012  . Essential hypertension 04/20/2012  . Palpitations 01/10/2009   Past Medical History:  Diagnosis Date  . Allergy   . GERD (gastroesophageal reflux disease)   . Heart murmur    No significant valvular lesion noted on echo.  . Hypercholesteremia   . Hypertension   . Palpitations 01/10/2009   14 day monitor- some sinus tachycardia, PVCs  . Paroxysmal atrial fibrillation (HCC) 12/27/2008   Past Surgical History:  Procedure Laterality Date  . ABDOMINAL HYSTERECTOMY  1980  . BREAST SURGERY    . CARDIAC CATHETERIZATION  12/16/2010   no evidence of CAD to explain anginal pain w/ positive troponin.  potential etiology is breakthrough AF  . EYE SURGERY Right 12/29/2016  . EYE SURGERY Left 01/19/2017  . FRACTURE SURGERY    . NM MYOVIEW LTD  April 2010   EF 64%, normal pattern of perfusion in all regions, no scintigraphic evidence of inducible ischemia; no significant wall motion abnormalities; EKG negative for ischemia; no significant change from last study; low risk scan  . TRANSTHORACIC ECHOCARDIOGRAM  April 2010   Echo - EF >55; RV mils/mod dilated, RV systolic function mildly reduced; RA mild/moderately dilated; moderate pulmonary hypertension; mild tricuspid regurgitation   Allergies  Allergen Reactions  . Antihistamines, Chlorpheniramine-Type Palpitations    Makes heart beat fast   Prior to Admission medications   Medication Sig Start Date End Date Taking? Authorizing Provider  amLODipine (NORVASC) 10 MG tablet Take 1 tablet (10 mg total) by mouth daily. 10/06/16 08/03/17  Crenshaw,  Madolyn Frieze, MD  apixaban (ELIQUIS) 5 MG TABS tablet Take 1 tablet (5 mg total) by mouth 2 (two) times daily. 10/28/17   Arty Baumgartner, NP  atorvastatin (LIPITOR) 10 MG tablet TAKE ONE TABLET BY MOUTH ONCE DAILY WITH  LUNCH 06/02/16   Marykay Lex, MD  cloNIDine (CATAPRES) 0.1 MG tablet May take a tablet as needed for blood pressure greater than sytolic 170. 08/03/17   Marykay Lex, MD    conjugated estrogens (PREMARIN) vaginal cream Place 0.5 Applicatorfuls vaginally 2 (two) times a week. 12/20/15   Linna Hoff, MD  flecainide (TAMBOCOR) 50 MG tablet TAKE ONE TABLET BY MOUTH TWICE DAILY 07/09/17   Creig Hines, NP  fluticasone [facility]) 50 MCG/ACT nasal spray USE TWO SPRAY(S) IN EACH NOSTRIL ONCE DAILY 01/05/17   [name], MD  lisinopril (PRINIVIL,ZESTRIL) 20 MG tablet TAKE ONE TABLET BY MOUTH ONCE DAILY 06/12/17   Creig Hines, NP  metoprolol succinate (TOPROL-XL) 50 MG 24 hr tablet Take 1 tablet (50 mg total) by mouth daily. Take with or immediately following a meal. 08/03/17 11/01/17  Marykay Lex, MD  Multiple Vitamin (MULTIVITAMIN WITH MINERALS) TABS tablet Take 1 tablet by mouth daily with lunch. Centrum Silver    [provider]  omeprazole (PRILOSEC) 20 MG capsule TAKE ONE CAPSULE ONCE DAILY, THEN TAKE ONE TWICE DAILY FOR 14 DAYS EVERY 4 MONTHS 07/22/16   [name], MD  RaNITidine HCl (ZANTAC PO) Take 1 tablet by mouth daily. Patient not sure of dosage    [provider]  sucralfate (CARAFATE) 1 GM/10ML suspension Take 10 mLs (1 g total) by mouth 4 (four) times daily -  with meals and at bedtime. 06/03/16   [name], MD   Social History   Socioeconomic History  . Marital status: Married    Spouse name: Not on file  . Number of children: Not on file  . Years of education: Not on file  . Highest education level: Not on file  Social Needs  . Financial resource strain: Not on file  . Food insecurity - worry: Not on file  . Food insecurity - inability: Not on file  . Transportation needs - medical: Not on file  . Transportation needs - non-medical: Not on file  Occupational History  . Not on file  Tobacco Use  . Smoking status: Never Smoker  . Smokeless tobacco: Never Used  Substance and Sexual Activity  . Alcohol use: No    Alcohol/week: 0.0 oz  . Drug use: No  . Sexual activity: Yes  Other  Topics Concern  . Not on file  Social History Narrative   Married with no children. Walks several times a week at least 3-4 times a week about 2 miles a time. Does not smoke and does not drink.   Review of Systems  Respiratory: Negative for shortness of breath.   Cardiovascular: Negative for chest pain.  Genitourinary: Negative for enuresis.  Neurological: Positive for tremors (in hands) and numbness (tingling in fingers/toes). Negative for seizures, syncope, weakness and headaches.      Objective:   Physical Exam  Constitutional: She is oriented to person, place, and time. She appears well-developed and well-nourished.  HENT:  Head: Normocephalic and atraumatic.  Eyes: Conjunctivae and EOM are normal. Pupils are equal, round, and reactive to light.  Neck: Carotid bruit is not present.  Cardiovascular: Normal rate, regular rhythm, normal heart sounds and intact distal pulses.  Pulses:  Dorsalis pedis pulses are 2+ on the right side, and 2+ on the left side.  Pulmonary/Chest: Effort normal and breath sounds normal.  Abdominal: Soft. She exhibits no pulsatile midline mass. There is no tenderness.  Musculoskeletal:  Both feet and fingers: cap refill less than 1 sec. No rash. Sensation in tact.  Neurological: She is alert and oriented to person, place, and time.  Skin: Skin is warm and dry.  Psychiatric: She has a normal mood and affect. Her behavior is normal.  Vitals reviewed.  Vitals:   11/03/17 0931 11/03/17 0938  BP: (!) 165/93 140/82  Pulse: 69   Resp: 18   Temp: 97.6 F (36.4 C)   TempSrc: Oral   SpO2: 100%   Weight: 122 lb (55.3 kg)   Height: 5\' 4"  (1.626 m)    Results for orders placed or performed in visit on 11/03/17  POCT glucose (manual entry)  Result Value Ref Range   POC Glucose 93 70 - 99 mg/dl      Assessment & Plan:  Donna MaserMary P Krzyzanowski is a 77 y.o. female Dysesthesia of multiple sites - Plan: POCT glucose (manual entry), Magnesium, TSH, CBC  Current  use of proton pump inhibitor - Plan: Vitamin B12, Basic metabolic panel, CBC  Gastroesophageal reflux disease, esophagitis presence not specified  Flushing - Plan: Magnesium, TSH, CBC  Encounter for screening for nutritional disorder - Plan: Vitamin B12, Magnesium, CBC  Dysesthesias, shakiness as reported above. Vital signs reassuring, glucose reassuring. Nonfocal neurologic exam, no tremor visualized on exam. Unlikely side effect of Eliquis.   -Check TSH, B12, magnesium, CBC with history of proton pump inhibitor used to rule out deficiency  -Recheck 1 week to discuss these results and determine next step. RTC precautions if worsening symptoms  No orders of the defined types were placed in this encounter.  Patient Instructions    I will check bloodwork as discussed, but please follow up in 1 week to discuss those results and to decide if other testing or referral needed. Return to the clinic or go to the nearest emergency room if any of your symptoms worsen or new symptoms occur.     IF you received an x-ray today, you will receive an invoice from [facility]. Please contact [facility] at [phone] with questions or concerns regarding your invoice.   IF you received labwork today, you will receive an invoice from [facility]. Please contact [facility] at [phone] with questions or concerns regarding your invoice.   Our billing staff will not be able to assist you with questions regarding bills from these companies.  You will be contacted with the lab results as soon as they are available. The fastest way to get your results is to activate your My Chart account. Instructions are located on the last page of this paperwork. If you have not heard from us regarding the results in 2 weeks, please contact this office.      I personally performed the services described in this documentation, which was scribed in my presence. The recorded information has been reviewed and  considered for accuracy and completeness, addended by me as needed, and agree with information above.  Signed,   Meredith StaggersJeffrey Tinnie Kunin, MD Primary Care at [facility].  11/05/17 9:12 AM

## 2017-11-04 LAB — BASIC METABOLIC PANEL
BUN/Creatinine Ratio: 10 — ABNORMAL LOW (ref 12–28)
BUN: 8 mg/dL (ref 8–27)
CALCIUM: 9.6 mg/dL (ref 8.7–10.3)
CHLORIDE: 104 mmol/L (ref 96–106)
CO2: 25 mmol/L (ref 20–29)
Creatinine, Ser: 0.83 mg/dL (ref 0.57–1.00)
GFR calc Af Amer: 80 mL/min/{1.73_m2} (ref >59)
GFR calc non Af Amer: 70 mL/min/{1.73_m2} (ref >59)
GLUCOSE: 91 mg/dL (ref 65–99)
POTASSIUM: 4.2 mmol/L (ref 3.5–5.2)
Sodium: 144 mmol/L (ref 134–144)

## 2017-11-04 LAB — TSH: TSH: 2.35 u[IU]/mL (ref 0.450–4.500)

## 2017-11-04 LAB — VITAMIN B12: VITAMIN B 12: 273 pg/mL (ref 232–1245)

## 2017-11-04 LAB — CBC
Hematocrit: 40.7 % (ref 34.0–46.6)
Hemoglobin: 12.3 g/dL (ref 11.1–15.9)
MCH: 25.8 pg — ABNORMAL LOW (ref 26.6–33.0)
MCHC: 30.2 g/dL — AB (ref 31.5–35.7)
MCV: 85 fL (ref 79–97)
Platelets: 240 10*3/uL (ref 150–379)
RBC: 4.77 x10E6/uL (ref 3.77–5.28)
RDW: 14.8 % (ref 12.3–15.4)
WBC: 2.9 10*3/uL — ABNORMAL LOW (ref 3.4–10.8)

## 2017-11-04 LAB — MAGNESIUM: MAGNESIUM: 2.4 mg/dL — AB (ref 1.6–2.3)

## 2017-11-05 ENCOUNTER — Encounter: Payer: Self-pay | Admitting: Family Medicine

## 2017-11-12 ENCOUNTER — Ambulatory Visit: Payer: Medicare Other | Admitting: Physician Assistant

## 2017-11-12 ENCOUNTER — Other Ambulatory Visit: Payer: Self-pay

## 2017-11-12 ENCOUNTER — Encounter: Payer: Self-pay | Admitting: Physician Assistant

## 2017-11-12 VITALS — BP 138/78 | HR 78 | Temp 97.9°F | Resp 18 | Ht 64.0 in | Wt 121.0 lb

## 2017-11-12 DIAGNOSIS — K219 Gastro-esophageal reflux disease without esophagitis: Secondary | ICD-10-CM

## 2017-11-12 DIAGNOSIS — R10812 Left upper quadrant abdominal tenderness: Secondary | ICD-10-CM | POA: Diagnosis not present

## 2017-11-12 LAB — POCT CBC
GRANULOCYTE PERCENT: 67.8 % (ref 37–80)
HCT, POC: 37.4 % — AB (ref 37.7–47.9)
Hemoglobin: 11.5 g/dL — AB (ref 12.2–16.2)
LYMPH, POC: 0.9 (ref 0.6–3.4)
MCH, POC: 24.9 pg — AB (ref 27–31.2)
MCHC: 30.7 g/dL — AB (ref 31.8–35.4)
MCV: 81 fL (ref 80–97)
MID (CBC): 0.1 (ref 0–0.9)
MPV: 7.4 fL (ref 0–99.8)
PLATELET COUNT, POC: 246 10*3/uL (ref 142–424)
POC Granulocyte: 2.1 (ref 2–6.9)
POC LYMPH PERCENT: 28.7 %L (ref 10–50)
POC MID %: 3.5 %M (ref 0–12)
RBC: 4.62 M/uL (ref 4.04–5.48)
RDW, POC: 14.5 %
WBC: 3.1 10*3/uL — AB (ref 4.6–10.2)

## 2017-11-12 MED ORDER — METOPROLOL SUCCINATE ER 50 MG PO TB24: 50.0000 mg | ORAL_TABLET | Freq: Every day | ORAL | 3 refills | Status: DC

## 2017-11-12 MED ORDER — OMEPRAZOLE 20 MG PO CPDR: 20.0000 mg | DELAYED_RELEASE_CAPSULE | Freq: Two times a day (BID) | ORAL | 0 refills | Status: DC

## 2017-11-12 NOTE — Progress Notes (Signed)
11/12/2017 12:09 PM   DOB: 15-Apr-1941 / MRN: [id number]  SUBJECTIVE:  Donna Simmons is a 77 y.o. female presenting for worsening GERD symptoms.  She sees Dr. Audley Hose for a history of same.  She takes omeprazole once every 4 weeks or so for about 2 weeks and then stops.  This is worked very well for her in the past however she tells me over the last 3-4 weeks she has had worsening GERD despite following her same regimen.  She denies dizziness or frank bleeding from the rectum.  She tells me she thinks her GERD is causing her to have some palpitations.  She denies chest pain during these, and denies dizziness.  She describes it more as a fluttering in the chest.  She is allergic to antihistamines, chlorpheniramine-type.   She  has a past medical history of Allergy, GERD (gastroesophageal reflux disease), Heart murmur, Hypercholesteremia, Hypertension, Palpitations (01/10/2009), and Paroxysmal atrial fibrillation (HCC) (12/27/2008).    She  reports that  has never smoked. she has never used smokeless tobacco. She reports that she does not drink alcohol or use drugs. She  reports that she currently engages in sexual activity. The patient  has a past surgical history that includes Breast surgery; Fracture surgery; Abdominal hysterectomy (1980); Cardiac catheterization (12/16/2010); transthoracic echocardiogram (April 2010); NM MYOVIEW LTD (April 2010); Eye surgery (Right, 12/29/2016); and Eye surgery (Left, 01/19/2017).  Her family history includes Arthritis in her mother; Cancer in her maternal grandmother; Cancer - Lung in her brother and brother; Cancer - Prostate in her brother; Dementia in her mother and sister; Heart disease in her father and maternal grandfather; Hypertension in her brother, mother, and sister.  Review of Systems  Constitutional: Negative for fever.  HENT: Negative for congestion.   Respiratory: Negative for cough.   Cardiovascular: Negative for chest pain.  Gastrointestinal:  Positive for abdominal pain and heartburn. Negative for blood in stool, constipation, diarrhea, melena, nausea and vomiting.  Skin: Negative for rash.    The problem list and medications were reviewed and updated by myself where necessary and exist elsewhere in the encounter.   OBJECTIVE:  BP 138/78   Pulse 78   Temp 97.9 F (36.6 C) (Oral)   Resp 18   Ht 5\' 4"  (1.626 m)   Wt 121 lb (54.9 kg)   SpO2 98%   BMI 20.77 kg/m   Physical Exam  Constitutional: She is active.  Non-toxic appearance.  Cardiovascular: Normal rate, regular rhythm, S1 normal, S2 normal, normal heart sounds and intact distal pulses. Exam reveals no gallop, no friction rub and no decreased pulses.  No murmur heard. Pulmonary/Chest: Effort normal. No stridor. No tachypnea. No respiratory distress. She has no wheezes. She has no rales.  Abdominal: She exhibits no distension and no mass. There is tenderness (Mild, epigastric.  Abdomen soft, without rebound.). There is no rebound and no guarding.  Musculoskeletal: She exhibits no edema.  Neurological: She is alert.  Skin: Skin is warm and dry. She is not diaphoretic. No pallor.    Results for orders placed or performed in visit on 11/12/17 (from the past 72 hour(s))  POCT CBC     Status: Abnormal   Collection Time: 11/12/17 12:01 PM  Result Value Ref Range   WBC 3.1 (A) 4.6 - 10.2 K/uL   Lymph, poc 0.9 0.6 - 3.4   POC LYMPH PERCENT 28.7 10 - 50 %L   MID (cbc) 0.1 0 - 0.9   POC MID %  3.5 0 - 12 %M   POC Granulocyte 2.1 2 - 6.9   Granulocyte percent 67.8 37 - 80 %G   RBC 4.62 4.04 - 5.48 M/uL   Hemoglobin 11.5 (A) 12.2 - 16.2 g/dL   HCT, POC 16.137.4 (A) 09.637.7 - 47.9 %   MCV 81.0 80 - 97 fL   MCH, POC 24.9 (A) 27 - 31.2 pg   MCHC 30.7 (A) 31.8 - 35.4 g/dL   RDW, POC 04.514.5 %   Platelet Count, POC 246 142 - 424 K/uL   MPV 7.4 0 - 99.8 fL   CBC Latest Ref Rng & Units 11/12/2017 11/03/2017 06/14/2017  WBC 4.6 - 10.2 K/uL 3.1(A) 2.9(L) 5.2  Hemoglobin 12.2 - 16.2  g/dL 11.5(A) 12.3 12.1  Hematocrit 37.7 - 47.9 % 37.4(A) 40.7 36.6  Platelets 150 - 379 x10E3/uL - 240 224     No results found.  ASSESSMENT AND PLAN:  Donna DandyMary was seen today for gastroesophageal reflux and tingling.  Diagnoses and all orders for this visit:  Left upper quadrant abdominal tenderness without rebound tenderness patient with some recalcitrant GERD symptoms.  I am starting her on 30 days of her proton pump inhibitor and have advised that she take this first thing in the morning about 30 minutes before breakfast.  She is somewhat frail at her baseline.  I am going to see her back in about 10 days or so to check her progress.  She will come back in sooner if she is having any worsening symptoms or changes in her symptoms. -     POCT CBC -     H. pylori breath test    The patient is advised to call or return to clinic if she does not see an improvement in symptoms, or to seek the care of the closest emergency department if she worsens with the above plan.   Deliah BostonMichael Clark, MHS, PA-C Primary Care at [facility] 11/12/2017 12:09 PM

## 2017-11-12 NOTE — Patient Instructions (Addendum)
See you in one week.  If at any point you are acutely worse come in sooner.      IF you received an x-ray today, you will receive an invoice from [facility]. Please contact [facility] at [phone] with questions or concerns regarding your invoice.   IF you received labwork today, you will receive an invoice from [facility]. Please contact [facility] at [phone] with questions or concerns regarding your invoice.   Our billing staff will not be able to assist you with questions regarding bills from these companies.  You will be contacted with the lab results as soon as they are available. The fastest way to get your results is to activate your My Chart account. Instructions are located on the last page of this paperwork. If you have not heard from us regarding the results in 2 weeks, please contact this office.

## 2017-11-14 LAB — H. PYLORI BREATH TEST: H pylori Breath Test: NEGATIVE

## 2017-11-17 ENCOUNTER — Encounter: Payer: Self-pay | Admitting: *Deleted

## 2017-11-20 ENCOUNTER — Ambulatory Visit: Payer: Medicare Other | Admitting: Physician Assistant

## 2017-11-20 VITALS — BP 164/82 | HR 73 | Temp 97.9°F | Resp 16 | Ht 64.0 in | Wt 121.0 lb

## 2017-11-20 DIAGNOSIS — K219 Gastro-esophageal reflux disease without esophagitis: Secondary | ICD-10-CM | POA: Diagnosis not present

## 2017-11-20 NOTE — Patient Instructions (Addendum)
I would like to check back with you in early June so we can go through your chronic conditions.     IF you received an x-ray today, you will receive an invoice from [facility]. Please contact [facility] at [phone] with questions or concerns regarding your invoice.   IF you received labwork today, you will receive an invoice from [facility]. Please contact [facility] at [phone] with questions or concerns regarding your invoice.   Our billing staff will not be able to assist you with questions regarding bills from these companies.  You will be contacted with the lab results as soon as they are available. The fastest way to get your results is to activate your My Chart account. Instructions are located on the last page of this paperwork. If you have not heard from us regarding the results in 2 weeks, please contact this office.

## 2017-11-20 NOTE — Progress Notes (Signed)
11/20/2017 1:58 PM   DOB: 08-08-1941 / MRN: [id number]  SUBJECTIVE:  Donna Simmons is a 77 y.o. female presenting for follow-up of GERD.  Since I saw her last she has been taking omeprazole 20 mg in the morning 30 minutes before breakfast.  She reports that she is 85% better today.  She is missed her morning dose of blood pressure medicine.  She denies blood in the stool and is no longer having abdominal pain.  She is allergic to antihistamines, chlorpheniramine-type.   She  has a past medical history of Allergy, GERD (gastroesophageal reflux disease), Heart murmur, Hypercholesteremia, Hypertension, Palpitations (01/10/2009), and Paroxysmal atrial fibrillation (HCC) (12/27/2008).    She  reports that she has never smoked. She has never used smokeless tobacco. She reports that she does not drink alcohol or use drugs. She  reports that she currently engages in sexual activity. The patient  has a past surgical history that includes Breast surgery; Fracture surgery; Abdominal hysterectomy (1980); Cardiac catheterization (12/16/2010); transthoracic echocardiogram (April 2010); NM MYOVIEW LTD (April 2010); Eye surgery (Right, 12/29/2016); and Eye surgery (Left, 01/19/2017).  Her family history includes Arthritis in her mother; Cancer in her maternal grandmother; Cancer - Lung in her brother and brother; Cancer - Prostate in her brother; Dementia in her mother and sister; Heart disease in her father and maternal grandfather; Hypertension in her brother, mother, and sister.  Review of Systems  Constitutional: Negative for chills, diaphoresis and fever.  Respiratory: Negative for cough, hemoptysis, sputum production, shortness of breath and wheezing.   Cardiovascular: Negative for chest pain, orthopnea and leg swelling.  Gastrointestinal: Negative for abdominal pain, blood in stool, constipation, diarrhea, heartburn, melena, nausea and vomiting.  Genitourinary: Negative for flank pain.  Skin: Negative  for rash.  Neurological: Negative for dizziness.    The problem list and medications were reviewed and updated by myself where necessary and exist elsewhere in the encounter.   OBJECTIVE:  BP (!) 164/82   Pulse 73   Temp 97.9 F (36.6 C) (Oral)   Resp 16   Ht 5\' 4"  (1.626 m)   Wt 121 lb (54.9 kg)   BMI 20.77 kg/m   CBC Latest Ref Rng & Units 11/12/2017 11/03/2017 06/14/2017  WBC 4.6 - 10.2 K/uL 3.1(A) 2.9(L) 5.2  Hemoglobin 12.2 - 16.2 g/dL 11.5(A) 12.3 12.1  Hematocrit 37.7 - 47.9 % 37.4(A) 40.7 36.6  Platelets 150 - 379 x10E3/uL - 240 224     Physical Exam  Constitutional: She is active.  Non-toxic appearance.  Cardiovascular: Normal rate, regular rhythm, S1 normal, S2 normal, normal heart sounds and intact distal pulses. Exam reveals no gallop, no friction rub and no decreased pulses.  No murmur heard. Pulmonary/Chest: Effort normal. No stridor. No tachypnea. No respiratory distress. She has no wheezes. She has no rales.  Abdominal: She exhibits no distension and no mass. There is no tenderness. There is no rebound and no guarding.  Musculoskeletal: She exhibits no edema.  Neurological: She is alert.  Skin: Skin is warm and dry. She is not diaphoretic. No pallor.    No results found for this or any previous visit (from the past 72 hour(s)).  No results found.  ASSESSMENT AND PLAN:  Donna Simmons was seen today for gastroesophageal reflux.  Diagnoses and all orders for this visit:  Gastroesophageal reflux disease, esophagitis presence not specified Comments: Patient 85% better today.  She denies blood in the stool and is no longer having any abdominal discomfort.  Her  exam is negative.  Will hold the course.    The patient is advised to call or return to clinic if she does not see an improvement in symptoms, or to seek the care of the closest emergency department if she worsens with the above plan.   Deliah BostonMichael Clark, MHS, PA-C [facility] at [facility] 11/20/2017 1:58 PM

## 2017-12-02 ENCOUNTER — Other Ambulatory Visit: Payer: Self-pay | Admitting: Nurse Practitioner

## 2018-01-05 ENCOUNTER — Ambulatory Visit: Payer: Medicare Other | Admitting: Family Medicine

## 2018-01-05 ENCOUNTER — Encounter: Payer: Self-pay | Admitting: Family Medicine

## 2018-01-05 ENCOUNTER — Other Ambulatory Visit: Payer: Self-pay

## 2018-01-05 VITALS — BP 150/82 | HR 68 | Temp 97.8°F | Resp 18 | Ht 64.0 in | Wt 119.2 lb

## 2018-01-05 DIAGNOSIS — R030 Elevated blood-pressure reading, without diagnosis of hypertension: Secondary | ICD-10-CM | POA: Diagnosis not present

## 2018-01-05 DIAGNOSIS — K219 Gastro-esophageal reflux disease without esophagitis: Secondary | ICD-10-CM

## 2018-01-05 MED ORDER — SUCRALFATE 1 GM/10ML PO SUSP: 1.0000 g | Freq: Three times a day (TID) | ORAL | 0 refills | Status: DC

## 2018-01-05 NOTE — Progress Notes (Signed)
Subjective:  By signing my name below, I, Stann Ore, attest that this documentation has been prepared under the direction and in the presence of Meredith Staggers, MD. Electronically Signed: Stann Ore, Scribe. 01/05/2018 , 1:51 PM .  Patient was seen in Room 1 .   Patient ID: Donna Simmons, female    DOB: [birth year], 77 y.o.   MRN: [id number] Chief Complaint  Patient presents with  . Gastroesophageal Reflux    pt states symptoms have been on and off for a week. Pt states burning got really bad yesterday. Pt states burning is from the mid chest up up to her throat. Pt states burning probably got worse because she was bending over in her yard yestersday.  . Follow-up   HPI Donna Simmons is a 77 y.o. female  Patient is here for reflux symptoms over the past week. She was seen on March 22nd by Deliah Boston. He had been seen 1 week prior, started on omeprazole , reportedly feeling much better on March 22nd without abdominal pain or blood in stool. She was continued on omeprazole. She did have a negative H. Pylori on March 14th, and hemoglobin 11.5 on March 14th. She does have a history of afib, HTN, PVC's and PAC's, followed by cardiologist, Dr. Herbie Baltimore at [facility], last seen in December. She was treated with Toprol and flecainide for rate control, rhythm control and Eliquis for anticoagulation.   Patient states her reflux symptoms started a week ago with some associated gurgling and belching. She mentions her GI, Dr. Elnoria Howard, had instructed her to take omeprazole for 2 weeks every 4th month; last taken omeprazole 3 weeks ago. She hasn't been taking it lately. She's been taking Zantac  BID with mild relief temporarily. She hasn't taken carafate recently. She notes having slight dizziness when her GERD becomes really bad, and it'll shoot her BP up. She denies abdominal pain, vomiting, night sweats, difficulty sleeping or unexplained weight loss. Her next appointment with Dr. Elnoria Howard  is in June, seen every 6 months.   Her cardiologist informed her to take clonidine if her BP shoots up above 170.   Patient Active Problem List   Diagnosis Date Noted  . Allergic dermatitis 02/02/2017  . Dysphagia 11/05/2015  . Long term current use of anticoagulant therapy 11/18/2012  . Hypercholesteremia 09/25/2012  . DDD (degenerative disc disease), cervical 05/19/2012  . Paroxysmal atrial fibrillation (HCC) 04/20/2012  . Essential hypertension 04/20/2012  . Palpitations 01/10/2009   Past Medical History:  Diagnosis Date  . Allergy   . GERD (gastroesophageal reflux disease)   . Heart murmur    No significant valvular lesion noted on echo.  . Hypercholesteremia   . Hypertension   . Palpitations 01/10/2009   14 day monitor- some sinus tachycardia, PVCs  . Paroxysmal atrial fibrillation (HCC) 12/27/2008   Past Surgical History:  Procedure Laterality Date  . ABDOMINAL HYSTERECTOMY  1980  . BREAST SURGERY    . CARDIAC CATHETERIZATION  12/16/2010   no evidence of CAD to explain anginal pain w/ positive troponin.  potential etiology is breakthrough AF  . EYE SURGERY Right 12/29/2016  . EYE SURGERY Left 01/19/2017  . FRACTURE SURGERY    . NM MYOVIEW LTD  April 2010   EF 64%, normal pattern of perfusion in all regions, no scintigraphic evidence of inducible ischemia; no significant wall motion abnormalities; EKG negative for ischemia; no significant change from last study; low risk scan  . TRANSTHORACIC ECHOCARDIOGRAM  April 2010  Echo - EF >55; RV mils/mod dilated, RV systolic function mildly reduced; RA mild/moderately dilated; moderate pulmonary hypertension; mild tricuspid regurgitation   Allergies  Allergen Reactions  . Antihistamines, Chlorpheniramine-Type Palpitations    Makes heart beat fast   Prior to Admission medications   Medication Sig Start Date End Date Taking? Authorizing Provider  amLODipine (NORVASC) 10 MG tablet  09/14/17   [provider]  apixaban  (ELIQUIS) 5 MG TABS tablet Take 1 tablet (5 mg total) by mouth 2 (two) times daily. 10/28/17   Arty Baumgartner, NP  atorvastatin (LIPITOR) 10 MG tablet TAKE ONE TABLET BY MOUTH ONCE DAILY WITH  LUNCH 06/02/16   Marykay Lex, MD  cloNIDine (CATAPRES) 0.1 MG tablet May take a tablet as needed for blood pressure greater than sytolic 170. 08/03/17   Marykay Lex, MD  conjugated estrogens (PREMARIN) vaginal cream Place 0.5 Applicatorfuls vaginally 2 (two) times a week. 12/20/15   Linna Hoff, MD  flecainide (TAMBOCOR) 50 MG tablet TAKE ONE TABLET BY MOUTH TWICE DAILY 07/09/17   Creig Hines, NP  fluticasone [facility]) 50 MCG/ACT nasal spray USE TWO SPRAY(S) IN EACH NOSTRIL ONCE DAILY 01/05/17   [name], MD  lisinopril (PRINIVIL,ZESTRIL) 20 MG tablet TAKE 1 TABLET BY MOUTH ONCE DAILY 12/03/17   Marykay Lex, MD  metoprolol succinate (TOPROL-XL) 50 MG 24 hr tablet Take 1 tablet (50 mg total) by mouth daily. Take with or immediately following a meal. 11/12/17 02/10/18  Ofilia Neas, PA-C  Multiple Vitamin (MULTIVITAMIN WITH MINERALS) TABS tablet Take 1 tablet by mouth daily with lunch. Centrum Silver    [provider]  omeprazole (PRILOSEC) 20 MG capsule Take 1 capsule (20 mg total) by mouth 2 (two) times daily before a meal. Take before breakfast. 11/12/17   Ofilia Neas, PA-C  RaNITidine HCl (ZANTAC PO) Take 1 tablet by mouth daily. Patient not sure of dosage    [provider]  sucralfate (CARAFATE) 1 GM/10ML suspension Take 10 mLs (1 g total) by mouth 4 (four) times daily -  with meals and at bedtime. 06/03/16   [name], MD   Social History   Socioeconomic History  . Marital status: Married    Spouse name: Not on file  . Number of children: Not on file  . Years of education: Not on file  . Highest education level: Not on file  Occupational History  . Not on file  Social Needs  . Financial resource strain: Not on file  . Food  insecurity:    Worry: Not on file    Inability: Not on file  . Transportation needs:    Medical: Not on file    Non-medical: Not on file  Tobacco Use  . Smoking status: Never Smoker  . Smokeless tobacco: Never Used  Substance and Sexual Activity  . Alcohol use: No    Alcohol/week: 0.0 oz  . Drug use: No  . Sexual activity: Yes  Lifestyle  . Physical activity:    Days per week: Not on file    Minutes per session: Not on file  . Stress: Not on file  Relationships  . Social connections:    Talks on phone: Not on file    Gets together: Not on file    Attends religious service: Not on file    Active member of club or organization: Not on file    Attends meetings of clubs or organizations: Not on file  Relationship status: Not on file  . Intimate partner violence:    Fear of current or ex partner: Not on file    Emotionally abused: Not on file    Physically abused: Not on file    Forced sexual activity: Not on file  Other Topics Concern  . Not on file  Social History Narrative   Married with no children. Walks several times a week at least 3-4 times a week about 2 miles a time. Does not smoke and does not drink.   Review of Systems  Constitutional: Negative for chills, fatigue, fever and unexpected weight change.  Respiratory: Negative for cough.   Gastrointestinal: Negative for abdominal distention, abdominal pain, blood in stool, constipation, diarrhea, nausea and vomiting.       Belching  Skin: Negative for rash and wound.  Neurological: Negative for dizziness, weakness and headaches.       Objective:   Physical Exam  Constitutional: She is oriented to person, place, and time. She appears well-developed and well-nourished. No distress.  HENT:  Head: Normocephalic and atraumatic.  Eyes: Pupils are equal, round, and reactive to light. EOM are normal.  Neck: Neck supple.  Cardiovascular: Normal rate, regular rhythm and normal heart sounds.  No ectopy    Pulmonary/Chest: Effort normal. No respiratory distress.  Abdominal: Soft. Bowel sounds are normal. She exhibits no distension. There is no tenderness.  Musculoskeletal: Normal range of motion.  Neurological: She is alert and oriented to person, place, and time.  Skin: Skin is warm and dry.  Psychiatric: She has a normal mood and affect. Her behavior is normal.  Nursing note and vitals reviewed.   Vitals:   01/05/18 1331 01/05/18 1351 01/05/18 1430  BP: (!) 170/88 (!) 180/98 (!) 150/82  Pulse: 68    Resp: 18    Temp: 97.8 F (36.6 C)    TempSrc: Oral    SpO2: 100%    Weight: 119 lb 3.2 oz (54.1 kg)    Height:  (1.626 m)         Assessment & Plan:    DARLYNE SCHMIESING is a 77 y.o. female Gastroesophageal reflux disease, esophagitis presence not specified - Plan: sucralfate (CARAFATE) 1 GM/10ML suspension  - appears to be typical reflux without apparent red flag symptoms.    -restart PPI, restart carafate and GERD trigger avoidance. Contact GI to discuss follow up, and RTC precautions given.   Elevated blood pressure reading  - improved during OV with self administration of clonidine. Monitor home readings and ER precautions if symptomatic with elevated readings.   Meds ordered this encounter  Medications  . sucralfate (CARAFATE) 1 GM/10ML suspension    Sig: Take 10 mLs (1 g total) by mouth 4 (four) times daily -  with meals and at bedtime.    Dispense:  420 mL    Refill:  0   Patient Instructions   Currently your symptoms do appear consistent with heartburn. Restart omeprazole once per day, ok to restart carafate for now, avoid foods below that can cause heartburn. Please call Dr. Elnoria Howard for evaluation of your heartburn symptoms in next 10 days if possible. Return to the clinic or go to the nearest emergency room if any of your symptoms worsen or new symptoms occur.  Thank you for coming in today.  Blood pressure was elevated today - can take medication as recommended  by your cardiologist but monitor that reading at home as well. If any new chest pains, shortness of breath,  headache, dizziness or weakness - be seen in the emergency room.   Food Choices for Gastroesophageal Reflux Disease, Adult When you have gastroesophageal reflux disease (GERD), the foods you eat and your eating habits are very important. Choosing the right foods can help ease your discomfort. What guidelines do I need to follow?  Choose fruits, vegetables, whole grains, and low-fat dairy products.  Choose low-fat meat, fish, and poultry.  Limit fats such as oils, salad dressings, butter, nuts, and avocado.  Keep a food diary. This helps you identify foods that cause symptoms.  Avoid foods that cause symptoms. These may be different for everyone.  Eat small meals often instead of 3 large meals a day.  Eat your meals slowly, in a place where you are relaxed.  Limit fried foods.  Cook foods using methods other than frying.  Avoid drinking alcohol.  Avoid drinking large amounts of liquids with your meals.  Avoid bending over or lying down until 2-3 hours after eating. What foods are not recommended? These are some foods and drinks that may make your symptoms worse: Vegetables Tomatoes. Tomato juice. Tomato and spaghetti sauce. Chili peppers. Onion and garlic. Horseradish. Fruits Oranges, grapefruit, and lemon (fruit and juice). Meats High-fat meats, fish, and poultry. This includes hot dogs, ribs, ham, sausage, salami, and bacon. Dairy Whole milk and chocolate milk. Sour cream. Cream. Butter. Ice cream. Cream cheese. Drinks Coffee and tea. Bubbly (carbonated) drinks or energy drinks. Condiments Hot sauce. Barbecue sauce. Sweets/Desserts Chocolate and cocoa. Donuts. Peppermint and spearmint. Fats and Oils High-fat foods. This includes [place] fries and potato chips. Other Vinegar. Strong spices. This includes black pepper, white pepper, red pepper, cayenne, curry  powder, cloves, ginger, and chili powder. The items listed above may not be a complete list of foods and drinks to avoid. Contact your dietitian for more information. This information is not intended to replace advice given to you by your health care provider. Make sure you discuss any questions you have with your health care provider. Document Released: 02/17/2012 Document Revised: 01/24/2016 Document Reviewed: 06/22/2013 Elsevier Interactive Patient Education  2017 [employer].    IF you received an x-ray today, you will receive an invoice from [facility]. Please contact [facility] at [phone] with questions or concerns regarding your invoice.   IF you received labwork today, you will receive an invoice from [place]. Please contact [facility] at [phone] with questions or concerns regarding your invoice.   Our billing staff will not be able to assist you with questions regarding bills from these companies.  You will be contacted with the lab results as soon as they are available. The fastest way to get your results is to activate your My Chart account. Instructions are located on the last page of this paperwork. If you have not heard from [place] regarding the results in 2 weeks, please contact this office.      I personally performed the services described in this documentation, which was scribed in my presence. The recorded information has been reviewed and considered for accuracy and completeness, addended by me as needed, and agree with information above.  Signed,   Meredith Staggers, MD [facility] at [facility].  01/08/18 9:36 PM

## 2018-01-05 NOTE — Patient Instructions (Addendum)
Currently your symptoms do appear consistent with heartburn. Restart omeprazole once per day, ok to restart carafate for now, avoid foods below that can cause heartburn. Please call Dr. Elnoria Howard for evaluation of your heartburn symptoms in next 10 days if possible. Return to the clinic or go to the nearest emergency room if any of your symptoms worsen or new symptoms occur.  Thank you for coming in today.  Blood pressure was elevated today - can take medication as recommended by your cardiologist but monitor that reading at home as well. If any new chest pains, shortness of breath, headache, dizziness or weakness - be seen in the emergency room.   Food Choices for Gastroesophageal Reflux Disease, Adult When you have gastroesophageal reflux disease (GERD), the foods you eat and your eating habits are very important. Choosing the right foods can help ease your discomfort. What guidelines do I need to follow?  Choose fruits, vegetables, whole grains, and low-fat dairy products.  Choose low-fat meat, fish, and poultry.  Limit fats such as oils, salad dressings, butter, nuts, and avocado.  Keep a food diary. This helps you identify foods that cause symptoms.  Avoid foods that cause symptoms. These may be different for everyone.  Eat small meals often instead of 3 large meals a day.  Eat your meals slowly, in a place where you are relaxed.  Limit fried foods.  Cook foods using methods other than frying.  Avoid drinking alcohol.  Avoid drinking large amounts of liquids with your meals.  Avoid bending over or lying down until 2-3 hours after eating. What foods are not recommended? These are some foods and drinks that may make your symptoms worse: Vegetables Tomatoes. Tomato juice. Tomato and spaghetti sauce. Chili peppers. Onion and garlic. Horseradish. Fruits Oranges, grapefruit, and lemon (fruit and juice). Meats High-fat meats, fish, and poultry. This includes hot dogs, ribs, ham,  sausage, salami, and bacon. Dairy Whole milk and chocolate milk. Sour cream. Cream. Butter. Ice cream. Cream cheese. Drinks Coffee and tea. Bubbly (carbonated) drinks or energy drinks. Condiments Hot sauce. Barbecue sauce. Sweets/Desserts Chocolate and cocoa. Donuts. Peppermint and spearmint. Fats and Oils High-fat foods. This includes [place] fries and potato chips. Other Vinegar. Strong spices. This includes black pepper, white pepper, red pepper, cayenne, curry powder, cloves, ginger, and chili powder. The items listed above may not be a complete list of foods and drinks to avoid. Contact your dietitian for more information. This information is not intended to replace advice given to you by your health care provider. Make sure you discuss any questions you have with your health care provider. Document Released: 02/17/2012 Document Revised: 01/24/2016 Document Reviewed: 06/22/2013 Elsevier Interactive Patient Education  2017 [employer].    IF you received an x-ray today, you will receive an invoice from [facility]. Please contact [facility] at [phone] with questions or concerns regarding your invoice.   IF you received labwork today, you will receive an invoice from [place]. Please contact [facility] at [phone] with questions or concerns regarding your invoice.   Our billing staff will not be able to assist you with questions regarding bills from these companies.  You will be contacted with the lab results as soon as they are available. The fastest way to get your results is to activate your My Chart account. Instructions are located on the last page of this paperwork. If you have not heard from [place] regarding the results in 2 weeks, please contact this office.

## 2018-01-11 ENCOUNTER — Telehealth: Payer: Self-pay | Admitting: Cardiology

## 2018-01-11 NOTE — Telephone Encounter (Signed)
Received Progress notes from [facility] on 01/11/18, Appt 02/01/18 @ 1:30PM.[place]

## 2018-01-18 ENCOUNTER — Other Ambulatory Visit: Payer: Self-pay | Admitting: Cardiology

## 2018-01-18 NOTE — Telephone Encounter (Signed)
Rx has been sent to the pharmacy electronically. ° °

## 2018-02-01 ENCOUNTER — Ambulatory Visit: Payer: Medicare Other | Admitting: Cardiology

## 2018-02-01 ENCOUNTER — Encounter: Payer: Self-pay | Admitting: Cardiology

## 2018-02-01 VITALS — BP 148/86 | HR 71 | Ht 64.0 in | Wt 118.0 lb

## 2018-02-01 DIAGNOSIS — I10 Essential (primary) hypertension: Secondary | ICD-10-CM

## 2018-02-01 DIAGNOSIS — Z7901 Long term (current) use of anticoagulants: Secondary | ICD-10-CM

## 2018-02-01 DIAGNOSIS — E7800 Pure hypercholesterolemia, unspecified: Secondary | ICD-10-CM

## 2018-02-01 DIAGNOSIS — I480 Paroxysmal atrial fibrillation: Secondary | ICD-10-CM | POA: Diagnosis not present

## 2018-02-01 DIAGNOSIS — R002 Palpitations: Secondary | ICD-10-CM | POA: Diagnosis not present

## 2018-02-01 MED ORDER — ATORVASTATIN CALCIUM 20 MG PO TABS: 20.0000 mg | ORAL_TABLET | Freq: Every day | ORAL | 3 refills | Status: DC

## 2018-02-01 NOTE — Progress Notes (Signed)
PCP: Ofilia Neas, PA-C  Clinic Note: Chief Complaint  Patient presents with  . Follow-up    No major symptoms  . Atrial Fibrillation    No recurrence    HPI: Donna Simmons is a 77 y.o. female with a PMH below who presents today for 11-month follow-up for hypertension and atrial fibrillation She's had several frequent episodes of PACs and PVCs. She also has documented PAF on ELIQUIS and flecainide. Her last ER visit for A. fib was in November 2017. By time she arrived she is back in sinus rhythm. This is the only time she notes chest discomfort is when she is actually in A. fib.  Donna Simmons was last seen in December 2018 --he was doing well.  Had not noticed any recurrent episodes of palpitations.  She had some episodes of high blood pressure however felt lightheaded and dizzy with blood pressures in the 200s.  She also has orthostatic dizziness. -->  Increase Imdur to 50 mg and added PRN clonidine SBP 170.  Recent Hospitalizations:   None  Studies Personally Reviewed - if available, images/films reviewed: From Epic Chart or Care Everywhere  None  Interval History: Donna Simmons presented today for six-month follow-up doing fine.  She had a really bad bout GERD and is now back on a PPI.  Somewhat Her statin was stopped by her PCP and now her LDL was noted to be elevated in September.  As for her palpitations, she really has not had any notable palpitations and her systolic blood pressures have been mostly at home ranging in the 120 to 140 mmHg range.  She brought her monitor with her showing with a trend.  Nothing to suggest recurrence of A. fib which for her is a very symptomatic condition. She also denies any fatigue or dizziness myalgias etc. but being on flecainide.  She is not having any further headache or blurred vision episodes.  No dizziness or wooziness.  No heart failure symptoms of PND, orthopnea or edema.  No syncope/near syncope or TIA/amaurosis fugax. She is on  Eliquis and denies any melena, hematochezia, hematuria or epistaxis.  No claudication    ROS: A comprehensive was performed. All pertinent positives and negatives noted above. Review of Systems  Constitutional: Negative for malaise/fatigue and weight loss.  HENT: Negative for nosebleeds.   Respiratory: Negative for shortness of breath.   Gastrointestinal: Negative for abdominal pain, blood in stool and melena.  Genitourinary: Negative for hematuria.  Musculoskeletal: Negative for falls, joint pain and myalgias.  Neurological: Positive for dizziness (Occasional orthostatic). Negative for headaches.  Endo/Heme/Allergies: Does not bruise/bleed easily.  Psychiatric/Behavioral: The patient is not nervous/anxious.   All other systems reviewed and are negative.   I have reviewed and (if needed) personally updated the patient's problem list, medications, allergies, past medical and surgical history, social and family history.   Past Medical History:  Diagnosis Date  . Allergy   . GERD (gastroesophageal reflux disease)   . Heart murmur    No significant valvular lesion noted on echo.  . Hypercholesteremia   . Hypertension   . Palpitations 01/10/2009   14 day monitor- some sinus tachycardia, PVCs  . Paroxysmal atrial fibrillation (HCC) 12/27/2008    Past Surgical History:  Procedure Laterality Date  . ABDOMINAL HYSTERECTOMY  1980  . BREAST SURGERY    . CARDIAC CATHETERIZATION  12/16/2010   no evidence of CAD to explain anginal pain w/ positive troponin.  potential etiology is breakthrough AF  .  EYE SURGERY Right 12/29/2016  . EYE SURGERY Left 01/19/2017  . FRACTURE SURGERY    . NM MYOVIEW LTD  April 2010   EF 64%, normal pattern of perfusion in all regions, no scintigraphic evidence of inducible ischemia; no significant wall motion abnormalities; EKG negative for ischemia; no significant change from last study; low risk scan  . TRANSTHORACIC ECHOCARDIOGRAM  April 2010   Echo - EF >55;  RV mils/mod dilated, RV systolic function mildly reduced; RA mild/moderately dilated; moderate pulmonary hypertension; mild tricuspid regurgitation    Current Meds  Medication Sig  . amLODipine (NORVASC) 10 MG tablet   . apixaban (ELIQUIS) 5 MG TABS tablet Take 1 tablet (5 mg total) by mouth 2 (two) times daily.  . cloNIDine (CATAPRES) 0.1 MG tablet May take a tablet as needed for blood pressure greater than sytolic 170.  . conjugated estrogens (PREMARIN) vaginal cream Place 0.5 Applicatorfuls vaginally 2 (two) times a week.  Marland Kitchen. ELIQUIS 5 MG TABS tablet TAKE 1 TABLET BY MOUTH TWICE DAILY  . flecainide (TAMBOCOR) 50 MG tablet TAKE ONE TABLET BY MOUTH TWICE DAILY  . fluticasone (FLONASE) 50 MCG/ACT nasal spray USE TWO SPRAY(S) IN EACH NOSTRIL ONCE DAILY  . lisinopril (PRINIVIL,ZESTRIL) 20 MG tablet TAKE 1 TABLET BY MOUTH ONCE DAILY  . metoprolol succinate (TOPROL-XL) 50 MG 24 hr tablet Take 1 tablet (50 mg total) by mouth daily. Take with or immediately following a meal.  . Multiple Vitamin (MULTIVITAMIN WITH MINERALS) TABS tablet Take 1 tablet by mouth daily with lunch. Centrum Silver  . omeprazole (PRILOSEC) 20 MG capsule Take 1 capsule (20 mg total) by mouth 2 (two) times daily before a meal. Take before breakfast.  . RaNITidine HCl (ZANTAC PO) Take 1 tablet by mouth daily. Patient not sure of dosage  . sucralfate (CARAFATE) 1 GM/10ML suspension Take 10 mLs (1 g total) by mouth 4 (four) times daily -  with meals and at bedtime.    Allergies  Allergen Reactions  . Antihistamines, Chlorpheniramine-Type Palpitations    Makes heart beat fast    Social History   Tobacco Use  . Smoking status: Never Smoker  . Smokeless tobacco: Never Used  Substance Use Topics  . Alcohol use: No    Alcohol/week: 0.0 oz  . Drug use: No   Social History   Social History Narrative   Married with no children. Walks several times a week at least 3-4 times a week about 2 miles a time. Does not smoke and  does not drink.    Family History   family history includes Arthritis in her mother; Cancer in her maternal grandmother; Cancer - Lung in her brother and brother; Cancer - Prostate in her brother; Dementia in her mother and sister; Heart disease in her father and maternal grandfather; Hypertension in her brother, mother, and sister.  Wt Readings from Last 3 Encounters:  02/01/18 118 lb (53.5 kg)  01/05/18 119 lb 3.2 oz (54.1 kg)  11/20/17 121 lb (54.9 kg)    PHYSICAL EXAM BP (!) 148/86   Pulse 71   Ht 5\' 4"  (1.626 m)   Wt 118 lb (53.5 kg)   SpO2 100%   BMI 20.25 kg/m   Physical Exam  Constitutional: She is oriented to person, place, and time. She appears well-developed and well-nourished. No distress.  Well-groomed.  Thin, but not frail.  Well appearing  HENT:  Head: Normocephalic and atraumatic.  Neck: No hepatojugular reflux and no JVD present. Carotid bruit is not present.  Cardiovascular: Normal rate, regular rhythm, normal heart sounds and intact distal pulses. Exam reveals no gallop and no friction rub.  No murmur heard. Nondisplaced PMI  Pulmonary/Chest: Effort normal and breath sounds normal. No respiratory distress. She has no wheezes. She has no rales. She exhibits no tenderness.  Abdominal: Soft. Bowel sounds are normal. She exhibits no distension. There is no tenderness. There is no rebound.  Musculoskeletal: Normal range of motion. She exhibits no edema.  Neurological: She is alert and oriented to person, place, and time.  Psychiatric: She has a normal mood and affect. Her behavior is normal. Judgment and thought content normal.  Vitals reviewed.   Adult ECG Report Not checked   Other studies Reviewed: Additional studies/ records that were reviewed today include:  Recent Labs:  n/a   2018: Total cholesterol 253, TG 75, HDL 86, LDL 152.  A1c 5.1.  BUN/Cr  8/0.83.    ASSESSMENT / PLAN: Problem List Items Addressed This Visit    Paroxysmal atrial  fibrillation (HCC) - CHA2DS2-VASc Score 3, on Eliquis - Primary (Chronic)    Stable, as far as  she can tell from a symptom standpoint no recurrences on current meds: Plan: Continue flecainide at low dose (with as needed dosing for breakthrough discussed)  Continue Toprol 50 mL daily for rate control.  (No sign of chronotropic incompetence)  Continue Eliquis --need to pay attention to renal function to ensure stable dose.      Relevant Medications   atorvastatin (LIPITOR) 20 MG tablet   Other Relevant Orders   Comprehensive metabolic panel (Completed)   Palpitations (Chronic)    Has had some sinus tachycardia and PVCs in the past as well as her A. fib.  These all been controlled on current dose of Toprol and flecainide.      Long term current use of anticoagulant therapy (Chronic)    On Eliquis.  No issues of bleeding.        Hypercholesteremia (Chronic)    Labs are being followed by PCP.  I think she probably needs to go back on her statin.  Recommend going back on Lipitor 20 mg daily.  I will recheck a set of labs now as a new baseline, but she should probably check in 6 months after starting statin.  The lipid function, and also renal function due to hypertension.      Relevant Medications   atorvastatin (LIPITOR) 20 MG tablet   Other Relevant Orders   Lipid panel (Completed)   Comprehensive metabolic panel (Completed)   Essential hypertension (Chronic)    Blood pressure looks better today and on her home monitors pretty well controlled.  I would not try to be more aggressive.  Plan:   Continue current dose of lisinopril, Toprol and amlodipine.  Still has clonidine for as needed, but has not had to use in the last several months.      Relevant Medications   atorvastatin (LIPITOR) 20 MG tablet      Current medicines are reviewed at length with the patient today. (+/- concerns) n/a The following changes have been made: -- no change.  Patient Instructions    MEDICATION INSTRUCTIONS  START ATORVASTATIN 20 MG- ONE TABLET AT BEDTIME    LABS ANYDAY IN THE NEXT WEEK CMP LIPID     Your physician wants you to follow-up in 12 MONTHS WITH DR Farah Lepak.You will receive a reminder letter in the mail two months in advance. If you don't receive a letter, please call our office  to schedule the follow-up appointment.   If you need a refill on your cardiac medications before your next appointment, please call your pharmacy.      Studies Ordered:   Orders Placed This Encounter  Procedures  . Lipid panel  . Comprehensive metabolic panel      Bryan Lemma, M.D., M.S. Interventional Cardiologist   Pager # [phone] Phone # [phone] [address] [place], [place] [address]

## 2018-02-01 NOTE — Patient Instructions (Addendum)
MEDICATION INSTRUCTIONS  START ATORVASTATIN 20 MG- ONE TABLET AT BEDTIME    LABS ANYDAY IN THE NEXT WEEK CMP LIPID     Your physician wants you to follow-up in 12 MONTHS WITH DR HARDING.You will receive a reminder letter in the mail two months in advance. If you don't receive a letter, please call our office to schedule the follow-up appointment.   If you need a refill on your cardiac medications before your next appointment, please call your pharmacy.

## 2018-02-02 LAB — COMPREHENSIVE METABOLIC PANEL
A/G RATIO: 1.8 (ref 1.2–2.2)
ALT: 7 IU/L (ref 0–32)
AST: 13 IU/L (ref 0–40)
Albumin: 4.2 g/dL (ref 3.5–4.8)
Alkaline Phosphatase: 55 IU/L (ref 39–117)
BUN/Creatinine Ratio: 19 (ref 12–28)
BUN: 15 mg/dL (ref 8–27)
Bilirubin Total: 0.5 mg/dL (ref 0.0–1.2)
CALCIUM: 9.2 mg/dL (ref 8.7–10.3)
CO2: 24 mmol/L (ref 20–29)
CREATININE: 0.81 mg/dL (ref 0.57–1.00)
Chloride: 105 mmol/L (ref 96–106)
GFR, EST AFRICAN AMERICAN: 83 mL/min/{1.73_m2} (ref >59)
GFR, EST NON AFRICAN AMERICAN: 72 mL/min/{1.73_m2} (ref >59)
Globulin, Total: 2.4 g/dL (ref 1.5–4.5)
Glucose: 83 mg/dL (ref 65–99)
POTASSIUM: 4 mmol/L (ref 3.5–5.2)
Sodium: 143 mmol/L (ref 134–144)
TOTAL PROTEIN: 6.6 g/dL (ref 6.0–8.5)

## 2018-02-02 LAB — LIPID PANEL
CHOL/HDL RATIO: 3 ratio (ref 0.0–4.4)
Cholesterol, Total: 231 mg/dL — ABNORMAL HIGH (ref 100–199)
HDL: 78 mg/dL (ref >39)
LDL Calculated: 138 mg/dL — ABNORMAL HIGH (ref 0–99)
TRIGLYCERIDES: 76 mg/dL (ref 0–149)
VLDL Cholesterol Cal: 15 mg/dL (ref 5–40)

## 2018-02-04 ENCOUNTER — Encounter: Payer: Self-pay | Admitting: Cardiology

## 2018-02-04 NOTE — Assessment & Plan Note (Signed)
On Eliquis.  No issues of bleeding.

## 2018-02-04 NOTE — Assessment & Plan Note (Signed)
Has had some sinus tachycardia and PVCs in the past as well as her A. fib.  These all been controlled on current dose of Toprol and flecainide.

## 2018-02-04 NOTE — Assessment & Plan Note (Signed)
Blood pressure looks better today and on her home monitors pretty well controlled.  I would not try to be more aggressive.  Plan:   Continue current dose of lisinopril, Toprol and amlodipine.  Still has clonidine for as needed, but has not had to use in the last several months.

## 2018-02-04 NOTE — Assessment & Plan Note (Addendum)
Stable, as far as  she can tell from a symptom standpoint no recurrences on current meds: Plan: Continue flecainide at low dose (with as needed dosing for breakthrough discussed)  Continue Toprol 50 mL daily for rate control.  (No sign of chronotropic incompetence)  Continue Eliquis --need to pay attention to renal function to ensure stable dose.

## 2018-02-04 NOTE — Assessment & Plan Note (Addendum)
Labs are being followed by PCP.  I think she probably needs to go back on her statin.  Recommend going back on Lipitor 20 mg daily.  I will recheck a set of labs now as a new baseline, but she should probably check in 6 months after starting statin.  The lipid function, and also renal function due to hypertension.

## 2018-02-05 ENCOUNTER — Telehealth: Payer: Self-pay | Admitting: *Deleted

## 2018-02-05 DIAGNOSIS — L239 Allergic contact dermatitis, unspecified cause: Secondary | ICD-10-CM

## 2018-02-05 DIAGNOSIS — E7800 Pure hypercholesterolemia, unspecified: Secondary | ICD-10-CM

## 2018-02-05 NOTE — Telephone Encounter (Signed)
Spoke to patient. Result given . Verbalized understanding Order placed for labs

## 2018-02-05 NOTE — Telephone Encounter (Signed)
-----   Message from Marykay Lexavid W Harding, MD sent at 02/04/2018 12:23 AM EDT ----- Cholesterol level was checked.  A little bit better than back in September, but still LDL 138.  Probably need to be checked in 4 to 6 months after starting back on statin.  Bryan Lemmaavid Harding, MD

## 2018-02-15 ENCOUNTER — Emergency Department (HOSPITAL_COMMUNITY): Payer: Medicare Other

## 2018-02-15 ENCOUNTER — Emergency Department (HOSPITAL_COMMUNITY)
Admission: EM | Admit: 2018-02-15 | Discharge: 2018-02-15 | Disposition: A | Discharge status: Home / Self Care | Payer: Medicare Other | Attending: Emergency Medicine | Admitting: Emergency Medicine

## 2018-02-15 ENCOUNTER — Encounter (HOSPITAL_COMMUNITY): Payer: Self-pay | Admitting: Emergency Medicine

## 2018-02-15 DIAGNOSIS — R1011 Right upper quadrant pain: Secondary | ICD-10-CM | POA: Diagnosis not present

## 2018-02-15 DIAGNOSIS — I10 Essential (primary) hypertension: Secondary | ICD-10-CM | POA: Insufficient documentation

## 2018-02-15 DIAGNOSIS — R109 Unspecified abdominal pain: Secondary | ICD-10-CM | POA: Diagnosis present

## 2018-02-15 DIAGNOSIS — R002 Palpitations: Secondary | ICD-10-CM | POA: Diagnosis not present

## 2018-02-15 DIAGNOSIS — Z79899 Other long term (current) drug therapy: Secondary | ICD-10-CM | POA: Diagnosis not present

## 2018-02-15 LAB — COMPREHENSIVE METABOLIC PANEL
ALK PHOS: 51 U/L (ref 38–126)
ALT: 11 U/L — AB (ref 14–54)
AST: 20 U/L (ref 15–41)
Albumin: 3.7 g/dL (ref 3.5–5.0)
Anion gap: 11 (ref 5–15)
BILIRUBIN TOTAL: 0.5 mg/dL (ref 0.3–1.2)
BUN: 6 mg/dL (ref 6–20)
CALCIUM: 9.1 mg/dL (ref 8.9–10.3)
CO2: 23 mmol/L (ref 22–32)
CREATININE: 0.99 mg/dL (ref 0.44–1.00)
Chloride: 106 mmol/L (ref 101–111)
GFR, EST NON AFRICAN AMERICAN: 55 mL/min — AB (ref >60)
Glucose, Bld: 94 mg/dL (ref 65–99)
Potassium: 3.8 mmol/L (ref 3.5–5.1)
Sodium: 140 mmol/L (ref 135–145)
Total Protein: 6.4 g/dL — ABNORMAL LOW (ref 6.5–8.1)

## 2018-02-15 LAB — URINALYSIS, ROUTINE W REFLEX MICROSCOPIC
BILIRUBIN URINE: NEGATIVE
Glucose, UA: NEGATIVE mg/dL
Ketones, ur: NEGATIVE mg/dL
Leukocytes, UA: NEGATIVE
Nitrite: NEGATIVE
PH: 9 — AB (ref 5.0–8.0)
Protein, ur: NEGATIVE mg/dL
SPECIFIC GRAVITY, URINE: 1.005 (ref 1.005–1.030)

## 2018-02-15 LAB — LIPASE, BLOOD: Lipase: 29 U/L (ref 11–51)

## 2018-02-15 LAB — CBC
HCT: 37.6 % (ref 36.0–46.0)
HEMOGLOBIN: 11.5 g/dL — AB (ref 12.0–15.0)
MCH: 25.1 pg — AB (ref 26.0–34.0)
MCHC: 30.6 g/dL (ref 30.0–36.0)
MCV: 82.1 fL (ref 78.0–100.0)
Platelets: 223 10*3/uL (ref 150–400)
RBC: 4.58 MIL/uL (ref 3.87–5.11)
RDW: 14.5 % (ref 11.5–15.5)
WBC: 4.4 10*3/uL (ref 4.0–10.5)

## 2018-02-15 LAB — I-STAT TROPONIN, ED: TROPONIN I, POC: 0 ng/mL (ref 0.00–0.08)

## 2018-02-15 NOTE — ED Provider Notes (Signed)
Patient placed in Quick Look pathway, seen and evaluated   Chief Complaint: Atrial fib  HPI:   Pt reports her heart was racing.  Pt reports over 100  ROS: no fever, no chiils   Physical Exam:   Gen: No distress  Neuro: Awake and Alert  Skin: Warm    Focused Exam: Lungs clear Heart rrr   Initiation of care has begun. The patient has been counseled on the process, plan, and necessity for staying for the completion/evaluation, and the remainder of the medical screening examination   Osie CheeksSofia, Kharson Rasmusson K, PA-C 02/15/18 1604    Wynetta FinesMessick, Peter C, MD 02/16/18 0000

## 2018-02-15 NOTE — ED Provider Notes (Signed)
[facility] EMERGENCY DEPARTMENT Provider Note   CSN: [id number] Arrival date & time: 02/15/18  1535     History   Chief Complaint Chief Complaint  Patient presents with  . Abdominal Pain    HPI Donna Simmons is a 77 y.o. female.  The history is provided by the patient. No language interpreter was used.  Abdominal Pain      Donna Simmons is a 77 y.o. female who presents to the Emergency Department complaining of abdominal pain, afib. He has a history of paroxysmal atrial fibrillation in this morning she felt like she was in rapid atrial fibrillation with a pounding heartbeat. The pounding heartbeat quickly went away. She took her routine medications around lunchtime and developed immediate right upper quadrant pain. Pain was described as a gas type feeling. Pain is completely resolved at this time. Currently she is completely symptom-free. She denies any fevers, nausea, vomiting, shortness of breath, diaphoresis, leg swelling or pain. She came in to be checked out because she felt like her heart was beating very hard earlier today. She is on Eliquis.    Past Medical History:  Diagnosis Date  . Allergy   . GERD (gastroesophageal reflux disease)   . Heart murmur    No significant valvular lesion noted on echo.  . Hypercholesteremia   . Hypertension   . Palpitations 01/10/2009   14 day monitor- some sinus tachycardia, PVCs  . Paroxysmal atrial fibrillation (HCC) 12/27/2008    Patient Active Problem List   Diagnosis Date Noted  . Allergic dermatitis 02/02/2017  . Dysphagia 11/05/2015  . Long term current use of anticoagulant therapy 11/18/2012  . Hypercholesteremia 09/25/2012  . DDD (degenerative disc disease), cervical 05/19/2012  . Paroxysmal atrial fibrillation (HCC) - CHA2DS2-VASc Score 3, on Eliquis 04/20/2012  . Essential hypertension 04/20/2012  . Palpitations 01/10/2009    Past Surgical History:  Procedure Laterality Date  . ABDOMINAL  HYSTERECTOMY  1980  . BREAST SURGERY    . CARDIAC CATHETERIZATION  12/16/2010   no evidence of CAD to explain anginal pain w/ positive troponin.  potential etiology is breakthrough AF  . EYE SURGERY Right 12/29/2016  . EYE SURGERY Left 01/19/2017  . FRACTURE SURGERY    . NM MYOVIEW LTD  April 2010   EF 64%, normal pattern of perfusion in all regions, no scintigraphic evidence of inducible ischemia; no significant wall motion abnormalities; EKG negative for ischemia; no significant change from last study; low risk scan  . TRANSTHORACIC ECHOCARDIOGRAM  April 2010   Echo - EF >55; RV mils/mod dilated, RV systolic function mildly reduced; RA mild/moderately dilated; moderate pulmonary hypertension; mild tricuspid regurgitation     OB History   None      Home Medications    Prior to Admission medications   Medication Sig Start Date End Date Taking? Authorizing Provider  acetaminophen (TYLENOL) 325 MG tablet Take 650 mg by mouth daily as needed for headache (pain).   Yes [provider]  amLODipine (NORVASC) 10 MG tablet Take 10 mg by mouth daily as needed (SBP >160).  09/14/17  Yes [provider]  apixaban (ELIQUIS) 5 MG TABS tablet Take 1 tablet (5 mg total) by mouth 2 (two) times daily. 10/28/17  Yes Arty Baumgartner, NP  atorvastatin (LIPITOR) 20 MG tablet Take 1 tablet (20 mg total) by mouth at bedtime. 02/01/18 05/02/18 Yes Marykay Lex, MD  cloNIDine (CATAPRES) 0.1 MG tablet May take a tablet as needed for blood  pressure greater than sytolic 170. Patient taking differently: Take 0.1 mg by mouth daily as needed (SBP >170).  08/03/17  Yes Marykay LexHarding, David W, MD  ENSURE (ENSURE) Take 237 mLs by mouth daily.   Yes [provider]  flecainide (TAMBOCOR) 50 MG tablet TAKE ONE TABLET BY MOUTH TWICE DAILY Patient taking differently: TAKE ONE TABLET BY MOUTH TWICE DAILY, MAY TAKE AN ADDITIONAL TABLET DURING THE DAY IS HEART RACES FOR MORE THAN 20 MINUTES 07/09/17  Yes  Creig HinesBerge, Christopher Ronald, NP  fluticasone (FLONASE) 50 MCG/ACT nasal spray USE TWO SPRAY(S) IN EACH NOSTRIL ONCE DAILY Patient taking differently: USE TWO SPRAY(S) IN EACH NOSTRIL ONCE DAILY AS NEEDED FOR CONGESTION 01/05/17  Yes Shade FloodGreene, Jeffrey R, MD  lisinopril (PRINIVIL,ZESTRIL) 20 MG tablet TAKE 1 TABLET BY MOUTH ONCE DAILY Patient taking differently: TAKE 1 TABLET BY MOUTH ONCE DAILY AT NOON 12/03/17  Yes Marykay LexHarding, David W, MD  metoprolol succinate (TOPROL-XL) 50 MG 24 hr tablet Take 50 mg by mouth daily at 12 noon. Take with or immediately following a meal.   Yes [provider]  Multiple Vitamin (MULTIVITAMIN WITH MINERALS) TABS tablet Take 1 tablet by mouth daily with lunch. Centrum Silver   Yes [provider]  omeprazole (PRILOSEC) 20 MG capsule Take 1 capsule (20 mg total) by mouth 2 (two) times daily before a meal. Take before breakfast. Patient taking differently: Take 20 mg by mouth daily before breakfast.  11/12/17  Yes Ofilia Neaslark, Michael L, PA-C  sucralfate (CARAFATE) 1 GM/10ML suspension Take 10 mLs (1 g total) by mouth 4 (four) times daily -  with meals and at bedtime. Patient taking differently: Take 1 g by mouth daily as needed (when eating gas producing foods).  01/05/18  Yes Shade FloodGreene, Jeffrey R, MD  Tetrahydrozoline HCl (VISINE OP) Place 1 drop into both eyes daily as needed (dry eyes).   Yes [provider]  conjugated estrogens (PREMARIN) vaginal cream Place 0.5 Applicatorfuls vaginally 2 (two) times a week. Patient not taking: Reported on 02/15/2018 12/20/15   Linna HoffKindl, James D, MD  ELIQUIS 5 MG TABS tablet TAKE 1 TABLET BY MOUTH TWICE DAILY Patient not taking: Reported on 02/15/2018 01/18/18   Marykay LexHarding, David W, MD  metoprolol succinate (TOPROL-XL) 50 MG 24 hr tablet Take 1 tablet (50 mg total) by mouth daily. Take with or immediately following a meal. 11/12/17 02/10/18  Ofilia Neaslark, Michael L, PA-C    Family History Family History  Problem Relation Age of Onset  .  Arthritis Mother        OSTEO  . Hypertension Mother   . Dementia Mother   . Heart disease Father   . Hypertension Sister   . Dementia Sister   . Cancer - Lung Brother   . Cancer Maternal Grandmother   . Heart disease Maternal Grandfather   . Cancer - Lung Brother   . Hypertension Brother   . Cancer - Prostate Brother     Social History Social History   Tobacco Use  . Smoking status: Never Smoker  . Smokeless tobacco: Never Used  Substance Use Topics  . Alcohol use: No    Alcohol/week: 0.0 oz  . Drug use: No     Allergies   Antihistamines, chlorpheniramine-type   Review of Systems Review of Systems  Gastrointestinal: Positive for abdominal pain.  All other systems reviewed and are negative.    Physical Exam Updated Vital Signs BP 127/70   Pulse (!) 57   Temp 97.9 F (36.6 C) (Oral)  Resp 13   SpO2 100%   Physical Exam  Constitutional: She is oriented to person, place, and time. She appears well-developed and well-nourished.  HENT:  Head: Normocephalic and atraumatic.  Cardiovascular: Normal rate and regular rhythm.  No murmur heard. Pulmonary/Chest: Effort normal and breath sounds normal. No respiratory distress. She exhibits no tenderness.  Abdominal: Soft. There is no rebound and no guarding.   Mild RUQ tenderness  Musculoskeletal: She exhibits no edema or tenderness.  Neurological: She is alert and oriented to person, place, and time.  Skin: Skin is warm and dry.  Psychiatric: She has a normal mood and affect. Her behavior is normal.  Nursing note and vitals reviewed.    ED Treatments / Results  Labs (all labs ordered are listed, but only abnormal results are displayed) Labs Reviewed  COMPREHENSIVE METABOLIC PANEL - Abnormal; Notable for the following components:      Result Value   Total Protein 6.4 (*)    ALT 11 (*)    GFR calc non Af Amer 55 (*)    All other components within normal limits  CBC - Abnormal; Notable for the following  components:   Hemoglobin 11.5 (*)    MCH 25.1 (*)    All other components within normal limits  URINALYSIS, ROUTINE W REFLEX MICROSCOPIC - Abnormal; Notable for the following components:   Color, Urine STRAW (*)    pH 9.0 (*)    Hgb urine dipstick SMALL (*)    Bacteria, UA RARE (*)    All other components within normal limits  LIPASE, BLOOD  I-STAT TROPONIN, ED    EKG EKG Interpretation  Date/Time:  Monday February 15 2018 15:59:59 EDT Ventricular Rate:  71 PR Interval:  174 QRS Duration: 84 QT Interval:  406 QTC Calculation: 441 R Axis:   7 Text Interpretation:  Normal sinus rhythm Normal ECG Confirmed by Tilden Fossa [phone]) on 02/15/2018 6:02:09 PM   Radiology Dg Chest 2 View  Result Date: 02/15/2018 CLINICAL DATA:  Atrial fibrillation, hypertension EXAM: CHEST - 2 VIEW COMPARISON:  PA and lateral chest x-ray of June 14, 2017 FINDINGS: The lungs are mildly hyperinflated. There is no focal infiltrate. There is no pleural effusion. The heart and pulmonary vascularity are normal. The mediastinum is normal in width. There is calcification in the wall of the aortic arch. The bony thorax exhibits no acute abnormality. IMPRESSION: There is no pneumonia, CHF, nor other acute cardiopulmonary abnormality. Thoracic aortic atherosclerosis. Electronically Signed   By: David  [place] M.D.   On: 02/15/2018 16:41   US Abdomen Complete  Result Date: 02/15/2018 CLINICAL DATA:  RIGHT upper quadrant pain.  Diarrhea. EXAM: ABDOMEN ULTRASOUND COMPLETE COMPARISON:  MR abdomen 04/05/2009 FINDINGS: Gallbladder: No gallstones or wall thickening visualized. No sonographic Murphy sign noted by sonographer. Common bile duct: Diameter: Normal, 4.5 mm. Liver: No gallstones or biliary ductal dilatation. Normal echogenicity. Known 2 cm hemangioma, RIGHT lobe of liver. Portal vein is patent on color Doppler imaging with normal direction of blood flow towards the liver. IVC: No abnormality visualized. Pancreas:  Visualized portion unremarkable. Spleen: Size and appearance within normal limits. Right Kidney: Length: 9.5 cm. Echogenicity within normal limits. No mass or hydronephrosis visualized. Left Kidney: Length: 9.5 cm. 2 mm echogenic focus, nonshadowing, lower pole, possible calculus. Echogenicity within normal limits. No mass or hydronephrosis visualized. Abdominal aorta: No aneurysm visualized. Other findings: None. IMPRESSION: Stable 2 cm hepatic hemangioma. No gallstones, gallbladder wall thickening, or biliary ductal dilatation. Electronically Signed  By: Elsie Stain M.D.   On: 02/15/2018 20:39    Procedures Procedures (including critical care time)  Medications Ordered in ED Medications - No data to display   Initial Impression / Assessment and Plan / ED Course  I have reviewed the triage vital signs and the nursing notes.  Pertinent labs & imaging results that were available during my care of the patient were reviewed by me and considered in my medical decision making (see chart for details).     Patient with history of paroxysmal atrial fibrillation here for evaluation following episode of right upper quadrant abdominal pain with associated palpitations. Her symptoms have completely resolved on ED arrival but she does have some mild right upper quadrant tenderness. EKG demonstrates sinus rhythm with no evidence of ischemia. Presentation is not consistent with ACS, PE, dissection, biliary colic, cholecystitis. Patient is feeling well on repeat assessment, eating without difficulty. Plan to discharge home with outpatient follow-up and return precautions.  Final Clinical Impressions(s) / ED Diagnoses   Final diagnoses:  RUQ pain  Palpitations    ED Discharge Orders    None       Tilden Fossa, MD 02/15/18 2300

## 2018-02-15 NOTE — ED Notes (Signed)
Pt transported to US

## 2018-02-15 NOTE — ED Triage Notes (Signed)
Pt here from home with  C/o right upper quadrant pain and diarrhea tim,es 1 day , pt also felt  Her heart was racing ems states that she was in SR

## 2018-02-15 NOTE — Discharge Instructions (Addendum)
The cause of your symptoms was not identified today.  Follow up with your family doctor for recheck.  Follow up with your Cardiologist.  Get rechecked immediately if you have any new or concerning symptoms.

## 2018-02-15 NOTE — ED Notes (Signed)
Pt given Mal[place]urkey sandwich with applesauce and water.

## 2018-02-22 ENCOUNTER — Encounter: Payer: Self-pay | Admitting: Physician Assistant

## 2018-02-22 ENCOUNTER — Ambulatory Visit: Payer: Medicare Other | Admitting: Physician Assistant

## 2018-02-22 VITALS — BP 133/74 | HR 80 | Temp 97.8°F | Resp 18 | Ht 64.0 in | Wt 121.4 lb

## 2018-02-22 DIAGNOSIS — R1011 Right upper quadrant pain: Secondary | ICD-10-CM | POA: Diagnosis not present

## 2018-02-22 NOTE — Progress Notes (Signed)
02/22/2018 11:43 AM   DOB: 1941-01-21 / MRN: [id number]  SUBJECTIVE:  Donna Simmons is a 77 y.o. female presenting for recheck right upper quadrant pain. Symptoms present for about 12 hours and she presented to the ED for evaluation.  The problem is resolved at this time. She has tried taking an extra Tambocor for associated palpitations.  Emergency department documentation reviewed shows that she was asymptomatic at the time of arrival.  Per patient she was advised that she likely did have an episode of atrial fibrillation.  She is compliant with metoprolol, Tambocor, Eliquis and is also managed by cardiology who is recently advised that she be seen yearly versus biannually.  She does tell me that she had drank prune juice at the advice of a friend prior to the right upper abdominal quadrant pain.  Drinking prune juice is new for her and she was unaware that this was also used for constipation.  She is allergic to antihistamines, chlorpheniramine-type.   She  has a past medical history of Allergy, GERD (gastroesophageal reflux disease), Heart murmur, Hypercholesteremia, Hypertension, Palpitations (01/10/2009), and Paroxysmal atrial fibrillation (HCC) (12/27/2008).    She  reports that she has never smoked. She has never used smokeless tobacco. She reports that she does not drink alcohol or use drugs. She  reports that she currently engages in sexual activity. The patient  has a past surgical history that includes Breast surgery; Fracture surgery; Abdominal hysterectomy (1980); Cardiac catheterization (12/16/2010); transthoracic echocardiogram (April 2010); NM MYOVIEW LTD (April 2010); Eye surgery (Right, 12/29/2016); and Eye surgery (Left, 01/19/2017).  Her family history includes Arthritis in her mother; Cancer in her maternal grandmother; Cancer - Lung in her brother and brother; Cancer - Prostate in her brother; Dementia in her mother and sister; Heart disease in her father and maternal  grandfather; Hypertension in her brother, mother, and sister.  Review of Systems  Constitutional: Negative for chills, diaphoresis and fever.  Eyes: Negative.   Respiratory: Negative for cough, hemoptysis, sputum production, shortness of breath and wheezing.   Cardiovascular: Negative for chest pain, orthopnea and leg swelling.  Gastrointestinal: Negative for abdominal pain, blood in stool, constipation, diarrhea, heartburn, melena, nausea and vomiting.  Genitourinary: Negative for flank pain.  Skin: Negative for rash.  Neurological: Negative for dizziness, sensory change, speech change, focal weakness and headaches.    The problem list and medications were reviewed and updated by myself where necessary and exist elsewhere in the encounter.   OBJECTIVE:  BP 133/74   Pulse 80   Temp 97.8 F (36.6 C) (Oral)   Resp 18   Ht 5\' 4"  (1.626 m)   Wt 121 lb 6.4 oz (55.1 kg)   SpO2 98%   BMI 20.84 kg/m   Wt Readings from Last 3 Encounters:  02/22/18 121 lb 6.4 oz (55.1 kg)  02/01/18 118 lb (53.5 kg)  01/05/18 119 lb 3.2 oz (54.1 kg)   Temp Readings from Last 3 Encounters:  02/22/18 97.8 F (36.6 C) (Oral)  02/15/18 97.9 F (36.6 C) (Oral)  01/05/18 97.8 F (36.6 C) (Oral)   BP Readings from Last 3 Encounters:  02/22/18 133/74  02/15/18 127/70  02/01/18 (!) 148/86   Pulse Readings from Last 3 Encounters:  02/22/18 80  02/15/18 (!) 57  02/01/18 71    Physical Exam  Constitutional: She is oriented to person, place, and time. She appears well-nourished.  Non-toxic appearance. No distress.  Eyes: Pupils are equal, round, and reactive to light. EOM  are normal.  Cardiovascular: Normal rate, regular rhythm, S1 normal, S2 normal, normal heart sounds and intact distal pulses. Exam reveals no gallop, no friction rub and no decreased pulses.  No murmur heard. Pulmonary/Chest: Effort normal. No stridor. No respiratory distress. She has no wheezes. She has no rales.  Abdominal: She  exhibits no distension.  Musculoskeletal: She exhibits no edema.  Neurological: She is alert and oriented to person, place, and time. No cranial nerve deficit. Gait normal.  Skin: Skin is warm and dry. She is not diaphoretic. No pallor.  Psychiatric: She has a normal mood and affect.  Vitals reviewed.   Lab Results  Component Value Date   HGBA1C 5.1 05/21/2017    Lab Results  Component Value Date   WBC 4.4 02/15/2018   HGB 11.5 (L) 02/15/2018   HCT 37.6 02/15/2018   MCV 82.1 02/15/2018   PLT 223 02/15/2018    Lab Results  Component Value Date   CREATININE 0.99 02/15/2018   BUN 6 02/15/2018   NA 140 02/15/2018   K 3.8 02/15/2018   CL 106 02/15/2018   CO2 23 02/15/2018    Lab Results  Component Value Date   ALT 11 (L) 02/15/2018   AST 20 02/15/2018   ALKPHOS 51 02/15/2018   BILITOT 0.5 02/15/2018    Lab Results  Component Value Date   TSH 2.350 11/03/2017    Lab Results  Component Value Date   CHOL 231 (H) 02/02/2018   HDL 78 02/02/2018   LDLCALC 138 (H) 02/02/2018   TRIG 76 02/02/2018   CHOLHDL 3.0 02/02/2018     ASSESSMENT AND PLAN:  [name] was seen today for hospital follow up and abdominal pain.  Diagnoses and all orders for this visit:  RUQ abdominal pain: No medications needed.  Problem has resolved.  I reviewed the ED work-up and documentation and I feel there is nothing left at this time. Comments: Resolved with normal vitals and normal exam.     The patient is advised to call or return to clinic if she does not see an improvement in symptoms, or to seek the care of the closest emergency department if she worsens with the above plan.   Deliah BostonMichael Monaye Blackie, MHS, PA-C [facility] 02/22/2018 11:43 AM

## 2018-02-22 NOTE — Patient Instructions (Addendum)
   Come back if you are having any trouble.   IF you received an x-ray today, you will receive an invoice from [facility]. Please contact [facility] at [phone] with questions or concerns regarding your invoice.   IF you received labwork today, you will receive an invoice from [facility]. Please contact [facility] at [phone] with questions or concerns regarding your invoice.   Our billing staff will not be able to assist you with questions regarding bills from these companies.  You will be contacted with the lab results as soon as they are available. The fastest way to get your results is to activate your My Chart account. Instructions are located on the last page of this paperwork. If you have not heard from us regarding the results in 2 weeks, please contact this office.

## 2018-03-24 ENCOUNTER — Encounter: Payer: Self-pay | Admitting: Emergency Medicine

## 2018-03-24 ENCOUNTER — Ambulatory Visit (INDEPENDENT_AMBULATORY_CARE_PROVIDER_SITE_OTHER): Payer: Medicare Other | Admitting: Emergency Medicine

## 2018-03-24 ENCOUNTER — Other Ambulatory Visit: Payer: Self-pay

## 2018-03-24 VITALS — BP 130/68 | HR 69 | Temp 98.3°F | Resp 16 | Ht 63.0 in | Wt 114.0 lb

## 2018-03-24 DIAGNOSIS — I10 Essential (primary) hypertension: Secondary | ICD-10-CM | POA: Diagnosis not present

## 2018-03-24 NOTE — Patient Instructions (Addendum)
   IF you received an x-ray today, you will receive an invoice from [facility]. Please contact [facility] at [phone] with questions or concerns regarding your invoice.   IF you received labwork today, you will receive an invoice from [facility]. Please contact [facility] at [phone] with questions or concerns regarding your invoice.   Our billing staff will not be able to assist you with questions regarding bills from these companies.  You will be contacted with the lab results as soon as they are available. The fastest way to get your results is to activate your My Chart account. Instructions are located on the last page of this paperwork. If you have not heard from us regarding the results in 2 weeks, please contact this office.     Hypertension Hypertension, commonly called high blood pressure, is when the force of blood pumping through the arteries is too strong. The arteries are the blood vessels that carry blood from the heart throughout the body. Hypertension forces the heart to work harder to pump blood and may cause arteries to become narrow or stiff. Having untreated or uncontrolled hypertension can cause heart attacks, strokes, kidney disease, and other problems. A blood pressure reading consists of a higher number over a lower number. Ideally, your blood pressure should be below 120/80. The first ("top") number is called the systolic pressure. It is a measure of the pressure in your arteries as your heart beats. The second ("bottom") number is called the diastolic pressure. It is a measure of the pressure in your arteries as the heart relaxes. What are the causes? The cause of this condition is not known. What increases the risk? Some risk factors for high blood pressure are under your control. Others are not. Factors you can change  Smoking.  Having type 2 diabetes mellitus, high cholesterol, or both.  Not getting enough exercise or physical  activity.  Being overweight.  Having too much fat, sugar, calories, or salt (sodium) in your diet.  Drinking too much alcohol. Factors that are difficult or impossible to change  Having chronic kidney disease.  Having a family history of high blood pressure.  Age. Risk increases with age.  Race. You may be at higher risk if you are African-American.  Gender. Men are at higher risk than women before age 45. After age 65, women are at higher risk than men.  Having obstructive sleep apnea.  Stress. What are the signs or symptoms? Extremely high blood pressure (hypertensive crisis) may cause:  Headache.  Anxiety.  Shortness of breath.  Nosebleed.  Nausea and vomiting.  Severe chest pain.  Jerky movements you cannot control (seizures).  How is this diagnosed? This condition is diagnosed by measuring your blood pressure while you are seated, with your arm resting on a surface. The cuff of the blood pressure monitor will be placed directly against the skin of your upper arm at the level of your heart. It should be measured at least twice using the same arm. Certain conditions can cause a difference in blood pressure between your right and left arms. Certain factors can cause blood pressure readings to be lower or higher than normal (elevated) for a short period of time:  When your blood pressure is higher when you are in a health care provider's office than when you are at home, this is called white coat hypertension. Most people with this condition do not need medicines.  When your blood pressure is higher at home than when you   are in a health care provider's office, this is called masked hypertension. Most people with this condition may need medicines to control blood pressure.  If you have a high blood pressure reading during one visit or you have normal blood pressure with other risk factors:  You may be asked to return on a different day to have your blood pressure  checked again.  You may be asked to monitor your blood pressure at home for 1 week or longer.  If you are diagnosed with hypertension, you may have other blood or imaging tests to help your health care provider understand your overall risk for other conditions. How is this treated? This condition is treated by making healthy lifestyle changes, such as eating healthy foods, exercising more, and reducing your alcohol intake. Your health care provider may prescribe medicine if lifestyle changes are not enough to get your blood pressure under control, and if:  Your systolic blood pressure is above 130.  Your diastolic blood pressure is above 80.  Your personal target blood pressure may vary depending on your medical conditions, your age, and other factors. Follow these instructions at home: Eating and drinking  Eat a diet that is high in fiber and potassium, and low in sodium, added sugar, and fat. An example eating plan is called the DASH (Dietary Approaches to Stop Hypertension) diet. To eat this way: ? Eat plenty of fresh fruits and vegetables. Try to fill half of your plate at each meal with fruits and vegetables. ? Eat whole grains, such as whole wheat pasta, brown rice, or whole grain bread. Fill about one quarter of your plate with whole grains. ? Eat or drink low-fat dairy products, such as skim milk or low-fat yogurt. ? Avoid fatty cuts of meat, processed or cured meats, and poultry with skin. Fill about one quarter of your plate with lean proteins, such as fish, chicken without skin, beans, eggs, and tofu. ? Avoid premade and processed foods. These tend to be higher in sodium, added sugar, and fat.  Reduce your daily sodium intake. Most people with hypertension should eat less than 1,500 mg of sodium a day.  Limit alcohol intake to no more than 1 drink a day for nonpregnant women and 2 drinks a day for men. One drink equals 12 oz of beer, 5 oz of wine, or 1 oz of hard  liquor. Lifestyle  Work with your health care provider to maintain a healthy body weight or to lose weight. Ask what an ideal weight is for you.  Get at least 30 minutes of exercise that causes your heart to beat faster (aerobic exercise) most days of the week. Activities may include walking, swimming, or biking.  Include exercise to strengthen your muscles (resistance exercise), such as pilates or lifting weights, as part of your weekly exercise routine. Try to do these types of exercises for 30 minutes at least 3 days a week.  Do not use any products that contain nicotine or tobacco, such as cigarettes and e-cigarettes. If you need help quitting, ask your health care provider.  Monitor your blood pressure at home as told by your health care provider.  Keep all follow-up visits as told by your health care provider. This is important. Medicines  Take over-the-counter and prescription medicines only as told by your health care provider. Follow directions carefully. Blood pressure medicines must be taken as prescribed.  Do not skip doses of blood pressure medicine. Doing this puts you at risk for problems and   can make the medicine less effective.  Ask your health care provider about side effects or reactions to medicines that you should watch for. Contact a health care provider if:  You think you are having a reaction to a medicine you are taking.  You have headaches that keep coming back (recurring).  You feel dizzy.  You have swelling in your ankles.  You have trouble with your vision. Get help right away if:  You develop a severe headache or confusion.  You have unusual weakness or numbness.  You feel faint.  You have severe pain in your chest or abdomen.  You vomit repeatedly.  You have trouble breathing. Summary  Hypertension is when the force of blood pumping through your arteries is too strong. If this condition is not controlled, it may put you at risk for serious  complications.  Your personal target blood pressure may vary depending on your medical conditions, your age, and other factors. For most people, a normal blood pressure is less than 120/80.  Hypertension is treated with lifestyle changes, medicines, or a combination of both. Lifestyle changes include weight loss, eating a healthy, low-sodium diet, exercising more, and limiting alcohol. This information is not intended to replace advice given to you by your health care provider. Make sure you discuss any questions you have with your health care provider. Document Released: 08/18/2005 Document Revised: 07/16/2016 Document Reviewed: 07/16/2016 [employer] Interactive Patient Education  2018 [employer]  

## 2018-03-24 NOTE — Progress Notes (Addendum)
Donna Simmons 77 y.o.   Chief Complaint  Patient presents with  . Hypertension    per patient wants blood pressure checked, at home the monitor indicates low readings    HISTORY OF PRESENT ILLNESS: This is a 77 y.o. female with history of hypertension on multiple medications.  Home blood pressure monitor showed a couple of low readings the past week.  Patient asymptomatic but wanted to be checked.  Denies syncope, chest pain, difficulty breathing, nausea or vomiting, diaphoresis, or any other significant symptoms.  HPI   Prior to Admission medications   Medication Sig Start Date End Date Taking? Authorizing Provider  acetaminophen (TYLENOL) 325 MG tablet Take 650 mg by mouth daily as needed for headache (pain).   Yes [provider]  amLODipine (NORVASC) 10 MG tablet Take 10 mg by mouth daily as needed (SBP >160).  09/14/17  Yes [provider]  apixaban (ELIQUIS) 5 MG TABS tablet Take 1 tablet (5 mg total) by mouth 2 (two) times daily. 10/28/17  Yes Arty Baumgartner, NP  atorvastatin (LIPITOR) 20 MG tablet Take 1 tablet (20 mg total) by mouth at bedtime. 02/01/18 05/02/18 Yes Marykay Lex, MD  cloNIDine (CATAPRES) 0.1 MG tablet May take a tablet as needed for blood pressure greater than sytolic 170. Patient taking differently: Take 0.1 mg by mouth daily as needed (SBP >170).  08/03/17  Yes Marykay Lex, MD  conjugated estrogens (PREMARIN) vaginal cream Place 0.5 Applicatorfuls vaginally 2 (two) times a week. 12/20/15  Yes Kindl, Quita Skye, MD  ENSURE (ENSURE) Take 237 mLs by mouth daily.   Yes [provider]  flecainide (TAMBOCOR) 50 MG tablet TAKE ONE TABLET BY MOUTH TWICE DAILY Patient taking differently: TAKE ONE TABLET BY MOUTH TWICE DAILY, MAY TAKE AN ADDITIONAL TABLET DURING THE DAY IS HEART RACES FOR MORE THAN 20 MINUTES 07/09/17  Yes Creig Hines, NP  fluticasone (FLONASE) 50 MCG/ACT nasal spray USE TWO SPRAY(S) IN EACH NOSTRIL ONCE  DAILY Patient taking differently: USE TWO SPRAY(S) IN EACH NOSTRIL ONCE DAILY AS NEEDED FOR CONGESTION 01/05/17  Yes [name], MD  lisinopril (PRINIVIL,ZESTRIL) 20 MG tablet TAKE 1 TABLET BY MOUTH ONCE DAILY Patient taking differently: TAKE 1 TABLET BY MOUTH ONCE DAILY AT NOON 12/03/17  Yes Marykay Lex, MD  Multiple Vitamin (MULTIVITAMIN WITH MINERALS) TABS tablet Take 1 tablet by mouth daily with lunch. Centrum Silver   Yes [provider]  omeprazole (PRILOSEC) 20 MG capsule Take 1 capsule (20 mg total) by mouth 2 (two) times daily before a meal. Take before breakfast. Patient taking differently: Take 20 mg by mouth daily before breakfast.  11/12/17  Yes Ofilia Neas, PA-C  sucralfate (CARAFATE) 1 GM/10ML suspension Take 10 mLs (1 g total) by mouth 4 (four) times daily -  with meals and at bedtime. Patient taking differently: Take 1 g by mouth daily as needed (when eating gas producing foods).  01/05/18  Yes [name], MD  Tetrahydrozoline HCl (VISINE OP) Place 1 drop into both eyes daily as needed (dry eyes).   Yes [provider]  ELIQUIS 5 MG TABS tablet TAKE 1 TABLET BY MOUTH TWICE DAILY 01/18/18   Marykay Lex, MD  metoprolol succinate (TOPROL-XL) 50 MG 24 hr tablet Take 1 tablet (50 mg total) by mouth daily. Take with or immediately following a meal. 11/12/17 02/10/18  Ofilia Neas, PA-C  metoprolol succinate (TOPROL-XL) 50 MG 24 hr tablet Take 50 mg by  mouth daily at 12 noon. Take with or immediately following a meal.    [provider]    Allergies  Allergen Reactions  . Antihistamines, Chlorpheniramine-Type Palpitations    Makes heart beat fast    Patient Active Problem List   Diagnosis Date Noted  . Allergic dermatitis 02/02/2017  . Dysphagia 11/05/2015  . Long term current use of anticoagulant therapy 11/18/2012  . Hypercholesteremia 09/25/2012  . DDD (degenerative disc disease), cervical 05/19/2012  . Paroxysmal atrial  fibrillation (HCC) - CHA2DS2-VASc Score 3, on Eliquis 04/20/2012  . Essential hypertension 04/20/2012  . Palpitations 01/10/2009    Past Medical History:  Diagnosis Date  . Allergy   . GERD (gastroesophageal reflux disease)   . Heart murmur    No significant valvular lesion noted on echo.  . Hypercholesteremia   . Hypertension   . Palpitations 01/10/2009   14 day monitor- some sinus tachycardia, PVCs  . Paroxysmal atrial fibrillation (HCC) 12/27/2008    Past Surgical History:  Procedure Laterality Date  . ABDOMINAL HYSTERECTOMY  1980  . BREAST SURGERY    . CARDIAC CATHETERIZATION  12/16/2010   no evidence of CAD to explain anginal pain w/ positive troponin.  potential etiology is breakthrough AF  . EYE SURGERY Right 12/29/2016  . EYE SURGERY Left 01/19/2017  . FRACTURE SURGERY    . NM MYOVIEW LTD  April 2010   EF 64%, normal pattern of perfusion in all regions, no scintigraphic evidence of inducible ischemia; no significant wall motion abnormalities; EKG negative for ischemia; no significant change from last study; low risk scan  . TRANSTHORACIC ECHOCARDIOGRAM  April 2010   Echo - EF >55; RV mils/mod dilated, RV systolic function mildly reduced; RA mild/moderately dilated; moderate pulmonary hypertension; mild tricuspid regurgitation    Social History   Socioeconomic History  . Marital status: Married    Spouse name: Not on file  . Number of children: Not on file  . Years of education: Not on file  . Highest education level: Not on file  Occupational History  . Not on file  Social Needs  . Financial resource strain: Not on file  . Food insecurity:    Worry: Not on file    Inability: Not on file  . Transportation needs:    Medical: Not on file    Non-medical: Not on file  Tobacco Use  . Smoking status: Never Smoker  . Smokeless tobacco: Never Used  Substance and Sexual Activity  . Alcohol use: No    Alcohol/week: 0.0 oz  . Drug use: No  . Sexual activity: Yes    Lifestyle  . Physical activity:    Days per week: Not on file    Minutes per session: Not on file  . Stress: Not on file  Relationships  . Social connections:    Talks on phone: Not on file    Gets together: Not on file    Attends religious service: Not on file    Active member of club or organization: Not on file    Attends meetings of clubs or organizations: Not on file    Relationship status: Not on file  . Intimate partner violence:    Fear of current or ex partner: Not on file    Emotionally abused: Not on file    Physically abused: Not on file    Forced sexual activity: Not on file  Other Topics Concern  . Not on file  Social History Narrative   Married with  no children. Walks several times a week at least 3-4 times a week about 2 miles a time. Does not smoke and does not drink.    Family History  Problem Relation Age of Onset  . Arthritis Mother        OSTEO  . Hypertension Mother   . Dementia Mother   . Heart disease Father   . Hypertension Sister   . Dementia Sister   . Cancer - Lung Brother   . Cancer Maternal Grandmother   . Heart disease Maternal Grandfather   . Cancer - Lung Brother   . Hypertension Brother   . Cancer - Prostate Brother      Review of Systems  Constitutional: Negative.  Negative for chills and fever.  HENT: Negative.  Negative for congestion, nosebleeds and sore throat.   Eyes: Negative.   Respiratory: Negative.  Negative for cough and shortness of breath.   Cardiovascular: Negative.  Negative for chest pain and palpitations.  Gastrointestinal: Negative.  Negative for abdominal pain, nausea and vomiting.  Genitourinary: Negative.  Negative for dysuria and hematuria.  Musculoskeletal: Negative.  Negative for back pain, myalgias and neck pain.  Skin: Negative.  Negative for rash.  Neurological: Negative for dizziness and headaches.  Endo/Heme/Allergies: Negative.   All other systems reviewed and are negative.   Vitals:   03/24/18  1354  BP: 130/68  Pulse: 69  Resp: 16  Temp: 98.3 F (36.8 C)  SpO2: 98%    Physical Exam  Constitutional: She is oriented to person, place, and time. She appears well-developed and well-nourished.  HENT:  Head: Normocephalic and atraumatic.  Nose: Nose normal.  Mouth/Throat: Oropharynx is clear and moist.  Eyes: Pupils are equal, round, and reactive to light. Conjunctivae and EOM are normal.  Neck: Normal range of motion. Neck supple. No JVD present.  Cardiovascular: Normal rate, regular rhythm and normal heart sounds.  Pulmonary/Chest: Effort normal and breath sounds normal.  Musculoskeletal: Normal range of motion. She exhibits no edema or tenderness.  Lymphadenopathy:    She has no cervical adenopathy.  Neurological: She is alert and oriented to person, place, and time. No sensory deficit. She exhibits normal muscle tone.  Skin: Skin is warm and dry. Capillary refill takes less than 2 seconds. No rash noted.  Psychiatric: She has a normal mood and affect. Her behavior is normal.  Vitals reviewed.  A total of 25 minutes was spent in the room with the patient, greater than 50% of which was in counseling/coordination of care regarding hypertension, management, medications, need for follow-up.  ASSESSMENT & PLAN: Donna Simmons was seen today for hypertension.  Diagnoses and all orders for this visit:  Essential hypertension     Patient Instructions       IF you received an x-ray today, you will receive an invoice from [facility]. Please contact [facility] at [phone] with questions or concerns regarding your invoice.   IF you received labwork today, you will receive an invoice from [place]. Please contact [facility] at [phone] with questions or concerns regarding your invoice.   Our billing staff will not be able to assist you with questions regarding bills from these companies.  You will be contacted with the lab results as soon as they are  available. The fastest way to get your results is to activate your My Chart account. Instructions are located on the last page of this paperwork. If you have not heard from [place] regarding the results in 2 weeks, please contact this  office.     Hypertension Hypertension, commonly called high blood pressure, is when the force of blood pumping through the arteries is too strong. The arteries are the blood vessels that carry blood from the heart throughout the body. Hypertension forces the heart to work harder to pump blood and may cause arteries to become narrow or stiff. Having untreated or uncontrolled hypertension can cause heart attacks, strokes, kidney disease, and other problems. A blood pressure reading consists of a higher number over a lower number. Ideally, your blood pressure should be below 120/80. The first ("top") number is called the systolic pressure. It is a measure of the pressure in your arteries as your heart beats. The second ("bottom") number is called the diastolic pressure. It is a measure of the pressure in your arteries as the heart relaxes. What are the causes? The cause of this condition is not known. What increases the risk? Some risk factors for high blood pressure are under your control. Others are not. Factors you can change  Smoking.  Having type 2 diabetes mellitus, high cholesterol, or both.  Not getting enough exercise or physical activity.  Being overweight.  Having too much fat, sugar, calories, or salt (sodium) in your diet.  Drinking too much alcohol. Factors that are difficult or impossible to change  Having chronic kidney disease.  Having a family history of high blood pressure.  Age. Risk increases with age.  Race. You may be at higher risk if you are African-American.  Gender. Men are at higher risk than women before age 57. After age 34, women are at higher risk than men.  Having obstructive sleep apnea.  Stress. What are the signs or  symptoms? Extremely high blood pressure (hypertensive crisis) may cause:  Headache.  Anxiety.  Shortness of breath.  Nosebleed.  Nausea and vomiting.  Severe chest pain.  Jerky movements you cannot control (seizures).  How is this diagnosed? This condition is diagnosed by measuring your blood pressure while you are seated, with your arm resting on a surface. The cuff of the blood pressure monitor will be placed directly against the skin of your upper arm at the level of your heart. It should be measured at least twice using the same arm. Certain conditions can cause a difference in blood pressure between your right and left arms. Certain factors can cause blood pressure readings to be lower or higher than normal (elevated) for a short period of time:  When your blood pressure is higher when you are in a health care provider's office than when you are at home, this is called white coat hypertension. Most people with this condition do not need medicines.  When your blood pressure is higher at home than when you are in a health care provider's office, this is called masked hypertension. Most people with this condition may need medicines to control blood pressure.  If you have a high blood pressure reading during one visit or you have normal blood pressure with other risk factors:  You may be asked to return on a different day to have your blood pressure checked again.  You may be asked to monitor your blood pressure at home for 1 week or longer.  If you are diagnosed with hypertension, you may have other blood or imaging tests to help your health care provider understand your overall risk for other conditions. How is this treated? This condition is treated by making healthy lifestyle changes, such as eating healthy foods, exercising more, and  reducing your alcohol intake. Your health care provider may prescribe medicine if lifestyle changes are not enough to get your blood pressure  under control, and if:  Your systolic blood pressure is above 130.  Your diastolic blood pressure is above 80.  Your personal target blood pressure may vary depending on your medical conditions, your age, and other factors. Follow these instructions at home: Eating and drinking  Eat a diet that is high in fiber and potassium, and low in sodium, added sugar, and fat. An example eating plan is called the DASH (Dietary Approaches to Stop Hypertension) diet. To eat this way: ? Eat plenty of fresh fruits and vegetables. Try to fill half of your plate at each meal with fruits and vegetables. ? Eat whole grains, such as whole wheat pasta, brown rice, or whole grain bread. Fill about one quarter of your plate with whole grains. ? Eat or drink low-fat dairy products, such as skim milk or low-fat yogurt. ? Avoid fatty cuts of meat, processed or cured meats, and poultry with skin. Fill about one quarter of your plate with lean proteins, such as fish, chicken without skin, beans, eggs, and tofu. ? Avoid premade and processed foods. These tend to be higher in sodium, added sugar, and fat.  Reduce your daily sodium intake. Most people with hypertension should eat less than 1,500 mg of sodium a day.  Limit alcohol intake to no more than 1 drink a day for nonpregnant women and 2 drinks a day for men. One drink equals 12 oz of beer, 5 oz of wine, or 1 oz of hard liquor. Lifestyle  Work with your health care provider to maintain a healthy body weight or to lose weight. Ask what an ideal weight is for you.  Get at least 30 minutes of exercise that causes your heart to beat faster (aerobic exercise) most days of the week. Activities may include walking, swimming, or biking.  Include exercise to strengthen your muscles (resistance exercise), such as pilates or lifting weights, as part of your weekly exercise routine. Try to do these types of exercises for 30 minutes at least 3 days a week.  Do not use any  products that contain nicotine or tobacco, such as cigarettes and e-cigarettes. If you need help quitting, ask your health care provider.  Monitor your blood pressure at home as told by your health care provider.  Keep all follow-up visits as told by your health care provider. This is important. Medicines  Take over-the-counter and prescription medicines only as told by your health care provider. Follow directions carefully. Blood pressure medicines must be taken as prescribed.  Do not skip doses of blood pressure medicine. Doing this puts you at risk for problems and can make the medicine less effective.  Ask your health care provider about side effects or reactions to medicines that you should watch for. Contact a health care provider if:  You think you are having a reaction to a medicine you are taking.  You have headaches that keep coming back (recurring).  You feel dizzy.  You have swelling in your ankles.  You have trouble with your vision. Get help right away if:  You develop a severe headache or confusion.  You have unusual weakness or numbness.  You feel faint.  You have severe pain in your chest or abdomen.  You vomit repeatedly.  You have trouble breathing. Summary  Hypertension is when the force of blood pumping through your arteries is too strong.  If this condition is not controlled, it may put you at risk for serious complications.  Your personal target blood pressure may vary depending on your medical conditions, your age, and other factors. For most people, a normal blood pressure is less than 120/80.  Hypertension is treated with lifestyle changes, medicines, or a combination of both. Lifestyle changes include weight loss, eating a healthy, low-sodium diet, exercising more, and limiting alcohol. This information is not intended to replace advice given to you by your health care provider. Make sure you discuss any questions you have with your health care  provider. Document Released: 08/18/2005 Document Revised: 07/16/2016 Document Reviewed: 07/16/2016 Elsevier Interactive Patient Education  2018 Elsevier Inc.     Edwina BarthMiguel Kaliah Haddaway, MD [facility]

## 2018-03-30 ENCOUNTER — Other Ambulatory Visit: Payer: Self-pay | Admitting: Cardiology

## 2018-04-20 ENCOUNTER — Other Ambulatory Visit: Payer: Self-pay | Admitting: Cardiology

## 2018-04-26 ENCOUNTER — Ambulatory Visit (INDEPENDENT_AMBULATORY_CARE_PROVIDER_SITE_OTHER): Payer: Medicare Other | Admitting: Emergency Medicine

## 2018-04-26 ENCOUNTER — Encounter: Payer: Self-pay | Admitting: Emergency Medicine

## 2018-04-26 ENCOUNTER — Other Ambulatory Visit: Payer: Self-pay

## 2018-04-26 ENCOUNTER — Ambulatory Visit (INDEPENDENT_AMBULATORY_CARE_PROVIDER_SITE_OTHER): Payer: Medicare Other

## 2018-04-26 VITALS — BP 170/83 | HR 70 | Temp 98.4°F | Resp 16 | Ht 62.5 in | Wt 116.4 lb

## 2018-04-26 DIAGNOSIS — Z23 Encounter for immunization: Secondary | ICD-10-CM

## 2018-04-26 DIAGNOSIS — S8012XA Contusion of left lower leg, initial encounter: Secondary | ICD-10-CM | POA: Diagnosis not present

## 2018-04-26 DIAGNOSIS — S8992XA Unspecified injury of left lower leg, initial encounter: Secondary | ICD-10-CM

## 2018-04-26 NOTE — Patient Instructions (Addendum)
     If you have lab work done today you will be contacted with your lab results within the next 2 weeks.  If you have not heard from us then please contact us. The fastest way to get your results is to register for My Chart.   IF you received an x-ray today, you will receive an invoice from [facility]. Please contact [facility] at [phone] with questions or concerns regarding your invoice.   IF you received labwork today, you will receive an invoice from [facility]. Please contact [facility] at [phone] with questions or concerns regarding your invoice.   Our billing staff will not be able to assist you with questions regarding bills from these companies.  You will be contacted with the lab results as soon as they are available. The fastest way to get your results is to activate your My Chart account. Instructions are located on the last page of this paperwork. If you have not heard from us regarding the results in 2 weeks, please contact this office.     Hematoma A hematoma is a collection of blood. The collection of blood can turn into a hard, painful lump under the skin. Your skin may turn blue or yellow if the hematoma is close to the surface of the skin. Most hematomas get better in a few days to weeks. Some hematomas are serious and need medical care. Hematomas can be very small or very big. Follow these instructions at home:  Apply ice to the injured area: ? Put ice in a plastic bag. ? Place a towel between your skin and the bag. ? Leave the ice on for 20 minutes, 2-3 times a day for the first 1 to 2 days.  After the first 2 days, switch to using warm packs on the injured area.  Raise (elevate) the injured area to lessen pain and puffiness (swelling). You may also wrap the area with an elastic bandage. Make sure the bandage is not wrapped too tight.  If you have a painful hematoma on your leg or foot, you may use crutches for a couple days.  Only take  medicines as told by your doctor. Get help right away if:  Your pain gets worse.  Your pain is not controlled with medicine.  You have a fever.  Your puffiness gets worse.  Your skin turns more blue or yellow.  Your skin over the hematoma breaks or starts bleeding.  Your hematoma is in your chest or belly (abdomen) and you are short of breath, feel weak, or have a change in consciousness.  Your hematoma is on your scalp and you have a headache that gets worse or a change in alertness or consciousness. This information is not intended to replace advice given to you by your health care provider. Make sure you discuss any questions you have with your health care provider. Document Released: 09/25/2004 Document Revised: 01/24/2016 Document Reviewed: 01/26/2013 Elsevier Interactive Patient Education  2017 [employer]

## 2018-04-26 NOTE — Progress Notes (Signed)
Donna Simmons 77 y.o.   Chief Complaint  Patient presents with  . Fall    x 1 week - injury to LEFT leg    HISTORY OF PRESENT ILLNESS: This is a 77 y.o. female slipped and injured left lower leg 1 week ago.  Has developed a hematoma with pain.  Here for evaluation.  Denies syncope.  Denies any other injuries. HPI   Prior to Admission medications   Medication Sig Start Date End Date Taking? Authorizing Provider  amLODipine (NORVASC) 10 MG tablet Take 10 mg by mouth daily as needed (SBP >160).  09/14/17  Yes [provider]  apixaban (ELIQUIS) 5 MG TABS tablet Take 1 tablet (5 mg total) by mouth 2 (two) times daily. 10/28/17  Yes Arty Baumgartner, NP  atorvastatin (LIPITOR) 20 MG tablet Take 1 tablet (20 mg total) by mouth at bedtime. 02/01/18 05/02/18 Yes Marykay Lex, MD  cloNIDine (CATAPRES) 0.1 MG tablet May take a tablet as needed for blood pressure greater than sytolic 170. Patient taking differently: Take 0.1 mg by mouth daily as needed (SBP >170).  08/03/17  Yes Marykay Lex, MD  conjugated estrogens (PREMARIN) vaginal cream Place 0.5 Applicatorfuls vaginally 2 (two) times a week. 12/20/15  Yes Linna Hoff, MD  ELIQUIS 5 MG TABS tablet TAKE 1 TABLET BY MOUTH TWICE DAILY 04/20/18  Yes Marykay Lex, MD  ENSURE (ENSURE) Take 237 mLs by mouth daily.   Yes [provider]  flecainide (TAMBOCOR) 50 MG tablet TAKE ONE TABLET BY MOUTH TWICE DAILY Patient taking differently: TAKE ONE TABLET BY MOUTH TWICE DAILY, MAY TAKE AN ADDITIONAL TABLET DURING THE DAY IS HEART RACES FOR MORE THAN 20 MINUTES 07/09/17  Yes Creig Hines, NP  fluticasone (FLONASE) 50 MCG/ACT nasal spray USE TWO SPRAY(S) IN EACH NOSTRIL ONCE DAILY Patient taking differently: USE TWO SPRAY(S) IN EACH NOSTRIL ONCE DAILY AS NEEDED FOR CONGESTION 01/05/17  Yes [name], MD  lisinopril (PRINIVIL,ZESTRIL) 20 MG tablet TAKE 1 TABLET BY MOUTH ONCE DAILY 03/30/18  Yes Marykay Lex,  MD  metoprolol succinate (TOPROL-XL) 50 MG 24 hr tablet Take 50 mg by mouth daily at 12 noon. Take with or immediately following a meal.   Yes [provider]  Multiple Vitamin (MULTIVITAMIN WITH MINERALS) TABS tablet Take 1 tablet by mouth daily with lunch. Centrum Silver   Yes [provider]  omeprazole (PRILOSEC) 20 MG capsule Take 1 capsule (20 mg total) by mouth 2 (two) times daily before a meal. Take before breakfast. Patient taking differently: Take 20 mg by mouth daily before breakfast.  11/12/17  Yes Ofilia Neas, PA-C  sucralfate (CARAFATE) 1 GM/10ML suspension Take 10 mLs (1 g total) by mouth 4 (four) times daily -  with meals and at bedtime. Patient taking differently: Take 1 g by mouth daily as needed (when eating gas producing foods).  01/05/18  Yes [name], MD  Tetrahydrozoline HCl (VISINE OP) Place 1 drop into both eyes daily as needed (dry eyes).   Yes [provider]  acetaminophen (TYLENOL) 325 MG tablet Take 650 mg by mouth daily as needed for headache (pain).    [provider]  metoprolol succinate (TOPROL-XL) 50 MG 24 hr tablet Take 1 tablet (50 mg total) by mouth daily. Take with or immediately following a meal. 11/12/17 02/10/18  Ofilia Neas, PA-C    Allergies  Allergen Reactions  . Antihistamines, Chlorpheniramine-Type Palpitations    Makes heart  beat fast    Patient Active Problem List   Diagnosis Date Noted  . Allergic dermatitis 02/02/2017  . Dysphagia 11/05/2015  . Long term current use of anticoagulant therapy 11/18/2012  . Hypercholesteremia 09/25/2012  . DDD (degenerative disc disease), cervical 05/19/2012  . Paroxysmal atrial fibrillation (HCC) - CHA2DS2-VASc Score 3, on Eliquis 04/20/2012  . Essential hypertension 04/20/2012  . Palpitations 01/10/2009    Past Medical History:  Diagnosis Date  . Allergy   . GERD (gastroesophageal reflux disease)   . Heart murmur    No significant valvular lesion  noted on echo.  . Hypercholesteremia   . Hypertension   . Palpitations 01/10/2009   14 day monitor- some sinus tachycardia, PVCs  . Paroxysmal atrial fibrillation (HCC) 12/27/2008    Past Surgical History:  Procedure Laterality Date  . ABDOMINAL HYSTERECTOMY  1980  . BREAST SURGERY    . CARDIAC CATHETERIZATION  12/16/2010   no evidence of CAD to explain anginal pain w/ positive troponin.  potential etiology is breakthrough AF  . EYE SURGERY Right 12/29/2016  . EYE SURGERY Left 01/19/2017  . FRACTURE SURGERY    . NM MYOVIEW LTD  April 2010   EF 64%, normal pattern of perfusion in all regions, no scintigraphic evidence of inducible ischemia; no significant wall motion abnormalities; EKG negative for ischemia; no significant change from last study; low risk scan  . TRANSTHORACIC ECHOCARDIOGRAM  April 2010   Echo - EF >55; RV mils/mod dilated, RV systolic function mildly reduced; RA mild/moderately dilated; moderate pulmonary hypertension; mild tricuspid regurgitation    Social History   Socioeconomic History  . Marital status: Married    Spouse name: Not on file  . Number of children: Not on file  . Years of education: Not on file  . Highest education level: Not on file  Occupational History  . Not on file  Social Needs  . Financial resource strain: Not on file  . Food insecurity:    Worry: Not on file    Inability: Not on file  . Transportation needs:    Medical: Not on file    Non-medical: Not on file  Tobacco Use  . Smoking status: Never Smoker  . Smokeless tobacco: Never Used  Substance and Sexual Activity  . Alcohol use: No    Alcohol/week: 0.0 standard drinks  . Drug use: No  . Sexual activity: Yes  Lifestyle  . Physical activity:    Days per week: Not on file    Minutes per session: Not on file  . Stress: Not on file  Relationships  . Social connections:    Talks on phone: Not on file    Gets together: Not on file    Attends religious service: Not on file     Active member of club or organization: Not on file    Attends meetings of clubs or organizations: Not on file    Relationship status: Not on file  . Intimate partner violence:    Fear of current or ex partner: Not on file    Emotionally abused: Not on file    Physically abused: Not on file    Forced sexual activity: Not on file  Other Topics Concern  . Not on file  Social History Narrative   Married with no children. Walks several times a week at least 3-4 times a week about 2 miles a time. Does not smoke and does not drink.    Family History  Problem Relation Age of  Onset  . Arthritis Mother        OSTEO  . Hypertension Mother   . Dementia Mother   . Heart disease Father   . Hypertension Sister   . Dementia Sister   . Cancer - Lung Brother   . Cancer Maternal Grandmother   . Heart disease Maternal Grandfather   . Cancer - Lung Brother   . Hypertension Brother   . Cancer - Prostate Brother      Review of Systems  Constitutional: Negative.  Negative for chills and fever.  HENT: Negative.   Respiratory: Negative.  Negative for shortness of breath.   Cardiovascular: Negative.  Negative for chest pain and palpitations.  Gastrointestinal: Negative.  Negative for abdominal pain, nausea and vomiting.  Genitourinary: Negative.  Negative for dysuria and hematuria.  Musculoskeletal: Negative for myalgias and neck pain.  Skin: Negative.  Negative for rash.  Neurological: Negative.  Negative for dizziness and headaches.  Endo/Heme/Allergies: Negative.   All other systems reviewed and are negative.  Vitals:   04/26/18 1507  BP: (!) 170/83  Pulse: 70  Resp: 16  Temp: 98.4 F (36.9 C)  SpO2: 100%     Physical Exam  Constitutional: She is oriented to person, place, and time. She appears well-developed and well-nourished.  HENT:  Head: Normocephalic and atraumatic.  Eyes: Pupils are equal, round, and reactive to light. EOM are normal.  Neck: Normal range of motion. Neck  supple.  Cardiovascular: Normal rate and regular rhythm.  Pulmonary/Chest: Effort normal and breath sounds normal.  Abdominal: Soft. There is no tenderness.  Musculoskeletal: Normal range of motion.  Left tib-fib area: Upper part shows tender hard hematoma.  No skin breakdown.  Otherwise unremarkable.  Neurological: She is alert and oriented to person, place, and time.  Skin: Skin is warm and dry. Capillary refill takes less than 2 seconds.  Psychiatric: She has a normal mood and affect. Her behavior is normal.  Vitals reviewed.   Dg Tibia/fibula Left  Result Date: 04/26/2018 CLINICAL DATA:  Leg injury, initial encounter. EXAM: LEFT TIBIA AND FIBULA - 2 VIEW COMPARISON:  None. FINDINGS: There is no evidence of fracture or other focal bone lesions. Soft tissues are unremarkable. There has been previous surgical repair of a medial malleolar fracture. The ankle joint appears narrow. IMPRESSION: No acute findings of the tibia or fibula. Electronically Signed   By: Elsie Stain M.D.   On: 04/26/2018 15:36   A total of 25 minutes was spent in the room with the patient, greater than 50% of which was in counseling/coordination of care regarding diagnosis, review of x-ray, management, prognosis and need for follow-up if no better or worse.  ASSESSMENT & PLAN:  Donna Simmons was seen today for fall.  Diagnoses and all orders for this visit:  Leg injury, left, initial encounter -     DG Tibia/Fibula Left; Future  Need for prophylactic vaccination and inoculation against influenza -     Flu Vaccine QUAD 36+ mos IM  Traumatic hematoma of left lower leg, initial encounter    Patient Instructions       If you have lab work done today you will be contacted with your lab results within the next 2 weeks.  If you have not heard from [place] then please contact us. The fastest way to get your results is to register for My Chart.   IF you received an x-ray today, you will receive an invoice from [facility]  Radiology. Please contact [facility] at  [phone] with questions or concerns regarding your invoice.   IF you received labwork today, you will receive an invoice from [facility]. Please contact [facility] at [phone] with questions or concerns regarding your invoice.   Our billing staff will not be able to assist you with questions regarding bills from these companies.  You will be contacted with the lab results as soon as they are available. The fastest way to get your results is to activate your My Chart account. Instructions are located on the last page of this paperwork. If you have not heard from us regarding the results in 2 weeks, please contact this office.     Hematoma A hematoma is a collection of blood. The collection of blood can turn into a hard, painful lump under the skin. Your skin may turn blue or yellow if the hematoma is close to the surface of the skin. Most hematomas get better in a few days to weeks. Some hematomas are serious and need medical care. Hematomas can be very small or very big. Follow these instructions at home:  Apply ice to the injured area: ? Put ice in a plastic bag. ? Place a towel between your skin and the bag. ? Leave the ice on for 20 minutes, 2-3 times a day for the first 1 to 2 days.  After the first 2 days, switch to using warm packs on the injured area.  Raise (elevate) the injured area to lessen pain and puffiness (swelling). You may also wrap the area with an elastic bandage. Make sure the bandage is not wrapped too tight.  If you have a painful hematoma on your leg or foot, you may use crutches for a couple days.  Only take medicines as told by your doctor. Get help right away if:  Your pain gets worse.  Your pain is not controlled with medicine.  You have a fever.  Your puffiness gets worse.  Your skin turns more blue or yellow.  Your skin over the hematoma breaks or starts bleeding.  Your hematoma is in your chest  or belly (abdomen) and you are short of breath, feel weak, or have a change in consciousness.  Your hematoma is on your scalp and you have a headache that gets worse or a change in alertness or consciousness. This information is not intended to replace advice given to you by your health care provider. Make sure you discuss any questions you have with your health care provider. Document Released: 09/25/2004 Document Revised: 01/24/2016 Document Reviewed: 01/26/2013 Elsevier Interactive Patient Education  2017 Elsevier Inc.     Edwina BarthMiguel Jayel Scaduto, MD [facility]

## 2018-06-08 ENCOUNTER — Telehealth: Payer: Self-pay | Admitting: *Deleted

## 2018-06-10 NOTE — Telephone Encounter (Signed)
Open error 

## 2018-06-11 ENCOUNTER — Telehealth: Payer: Self-pay | Admitting: *Deleted

## 2018-06-11 DIAGNOSIS — E7800 Pure hypercholesterolemia, unspecified: Secondary | ICD-10-CM

## 2018-06-11 NOTE — Telephone Encounter (Signed)
Mail  Letter and labslip

## 2018-06-11 NOTE — Telephone Encounter (Signed)
-----   Message from Tobin Chad, RN sent at 02/05/2018  1:40 PM EDT ----- DUE NOV 7 ,2019  MAIL @ OCT 7,2019 LIPID , HEPATIC

## 2018-07-02 HISTORY — PX: TRANSTHORACIC ECHOCARDIOGRAM: SHX275

## 2018-07-16 ENCOUNTER — Encounter (HOSPITAL_COMMUNITY): Payer: Self-pay | Admitting: Emergency Medicine

## 2018-07-16 ENCOUNTER — Emergency Department (HOSPITAL_COMMUNITY)
Admission: EM | Admit: 2018-07-16 | Discharge: 2018-07-16 | Disposition: A | Discharge status: Home / Self Care | Payer: Medicare Other | Attending: Emergency Medicine | Admitting: Emergency Medicine

## 2018-07-16 ENCOUNTER — Other Ambulatory Visit: Payer: Self-pay

## 2018-07-16 DIAGNOSIS — I10 Essential (primary) hypertension: Secondary | ICD-10-CM | POA: Insufficient documentation

## 2018-07-16 DIAGNOSIS — R002 Palpitations: Secondary | ICD-10-CM | POA: Diagnosis not present

## 2018-07-16 LAB — COMPREHENSIVE METABOLIC PANEL
ALBUMIN: 3.9 g/dL (ref 3.5–5.0)
ALT: 14 U/L (ref 0–44)
AST: 23 U/L (ref 15–41)
Alkaline Phosphatase: 55 U/L (ref 38–126)
Anion gap: 8 (ref 5–15)
BUN: 10 mg/dL (ref 8–23)
CHLORIDE: 104 mmol/L (ref 98–111)
CO2: 28 mmol/L (ref 22–32)
Calcium: 9.4 mg/dL (ref 8.9–10.3)
Creatinine, Ser: 1.03 mg/dL — ABNORMAL HIGH (ref 0.44–1.00)
GFR calc Af Amer: >60 mL/min (ref >60)
GFR calc non Af Amer: 52 mL/min — ABNORMAL LOW (ref >60)
Glucose, Bld: 104 mg/dL — ABNORMAL HIGH (ref 70–99)
Potassium: 3.8 mmol/L (ref 3.5–5.1)
SODIUM: 140 mmol/L (ref 135–145)
Total Bilirubin: 0.8 mg/dL (ref 0.3–1.2)
Total Protein: 6.8 g/dL (ref 6.5–8.1)

## 2018-07-16 LAB — CBC WITH DIFFERENTIAL/PLATELET
ABS IMMATURE GRANULOCYTES: 0 10*3/uL (ref 0.00–0.07)
BASOS ABS: 0 10*3/uL (ref 0.0–0.1)
BASOS PCT: 0 %
Eosinophils Absolute: 0 10*3/uL (ref 0.0–0.5)
Eosinophils Relative: 0 %
HCT: 38.6 % (ref 36.0–46.0)
Hemoglobin: 11.5 g/dL — ABNORMAL LOW (ref 12.0–15.0)
IMMATURE GRANULOCYTES: 0 %
Lymphocytes Relative: 37 %
Lymphs Abs: 1.6 10*3/uL (ref 0.7–4.0)
MCH: 24.5 pg — ABNORMAL LOW (ref 26.0–34.0)
MCHC: 29.8 g/dL — ABNORMAL LOW (ref 30.0–36.0)
MCV: 82.3 fL (ref 80.0–100.0)
Monocytes Absolute: 0.4 10*3/uL (ref 0.1–1.0)
Monocytes Relative: 9 %
NEUTROS PCT: 54 %
Neutro Abs: 2.3 10*3/uL (ref 1.7–7.7)
Platelets: 231 10*3/uL (ref 150–400)
RBC: 4.69 MIL/uL (ref 3.87–5.11)
RDW: 14.6 % (ref 11.5–15.5)
WBC: 4.4 10*3/uL (ref 4.0–10.5)
nRBC: 0 % (ref 0.0–0.2)

## 2018-07-16 LAB — I-STAT TROPONIN, ED: Troponin i, poc: 0 ng/mL (ref 0.00–0.08)

## 2018-07-16 NOTE — ED Provider Notes (Signed)
Emergency Department Provider Note   I have reviewed the triage vital signs and the nursing notes.   HISTORY  Chief Complaint Heart Palpitations   HPI Donna Simmons is a 77 y.o. female with a history of atrial fibrillation and other medical problems documented below the presents to the emergency department today with palpitations.  Patient states that she had palpitations that started earlier tonight and she took her flecainide and metoprolol as instructed by her doctors however after approximately 2 or 3 hours they did not resolve so she presented here.  Just prior to being evaluated she states that they improved.  States that usually when this happens it does not take that long to get better.  She denies any chest pain, shortness of breath, dizziness, lightheadedness or vision changes.  No syncope.  No recent illnesses.  No vomiting, diarrhea or other associated symptoms.  No urinary symptoms. No other associated or modifying symptoms.    Past Medical History:  Diagnosis Date  . Allergy   . GERD (gastroesophageal reflux disease)   . Heart murmur    No significant valvular lesion noted on echo.  . Hypercholesteremia   . Hypertension   . Palpitations 01/10/2009   14 day monitor- some sinus tachycardia, PVCs  . Paroxysmal atrial fibrillation (HCC) 12/27/2008    Patient Active Problem List   Diagnosis Date Noted  . Leg injury, left, initial encounter 04/26/2018  . Traumatic hematoma of left lower leg 04/26/2018  . Allergic dermatitis 02/02/2017  . Dysphagia 11/05/2015  . Long term current use of anticoagulant therapy 11/18/2012  . Hypercholesteremia 09/25/2012  . DDD (degenerative disc disease), cervical 05/19/2012  . Paroxysmal atrial fibrillation (HCC) - CHA2DS2-VASc Score 3, on Eliquis 04/20/2012  . Essential hypertension 04/20/2012  . Palpitations 01/10/2009    Past Surgical History:  Procedure Laterality Date  . ABDOMINAL HYSTERECTOMY  1980  . BREAST SURGERY      . CARDIAC CATHETERIZATION  12/16/2010   no evidence of CAD to explain anginal pain w/ positive troponin.  potential etiology is breakthrough AF  . EYE SURGERY Right 12/29/2016  . EYE SURGERY Left 01/19/2017  . FRACTURE SURGERY    . NM MYOVIEW LTD  April 2010   EF 64%, normal pattern of perfusion in all regions, no scintigraphic evidence of inducible ischemia; no significant wall motion abnormalities; EKG negative for ischemia; no significant change from last study; low risk scan  . TRANSTHORACIC ECHOCARDIOGRAM  April 2010   Echo - EF >55; RV mils/mod dilated, RV systolic function mildly reduced; RA mild/moderately dilated; moderate pulmonary hypertension; mild tricuspid regurgitation    Current Outpatient Rx  . Order #: [id number] Class: Historical Med  . Order #: [id number] Class: Historical Med  . Order #: [id number] Class: Normal  . Order #: [id number] Class: Normal  . Order #: [id number] Class: Normal  . Order #: [id number] Class: Historical Med  . Order #: [id number] Class: Normal  . Order #: [id number] Class: Normal  . Order #: [id number] Class: Normal  . Order #: [id number] Class: Normal  . Order #: [id number] Class: Historical Med  . Order #: [id number] Class: Normal  . Order #: [id number] Class: Normal    Allergies Antihistamines, chlorpheniramine-type  Family History  Problem Relation Age of Onset  . Arthritis Mother        OSTEO  . Hypertension Mother   . Dementia Mother   . Heart disease Father   . Hypertension Sister   . Dementia Sister   . Cancer - Lung Brother   . Cancer Maternal Grandmother   .  Heart disease Maternal Grandfather   . Cancer - Lung Brother   . Hypertension Brother   . Cancer - Prostate Brother     Social History Social History   Tobacco Use  . Smoking status: Never Smoker  . Smokeless tobacco: Never Used  Substance Use Topics  . Alcohol use: No    Alcohol/week: 0.0 standard drinks  . Drug use: No    Review of Systems  All other systems negative except  as documented in the HPI. All pertinent positives and negatives as reviewed in the HPI. ____________________________________________   PHYSICAL EXAM:  VITAL SIGNS: Blood pressure 134/80, pulse 71, temperature 97.8 F (36.6 C), temperature source Oral, resp. rate 13, weight 52.8 kg, SpO2 100 %.  Constitutional: Alert and oriented. Well appearing and in no acute distress. Eyes: Conjunctivae are normal. PERRL. EOMI. Head: Atraumatic. Nose: No congestion/rhinnorhea. Mouth/Throat: Mucous membranes are moist.  Oropharynx non-erythematous. Neck: No stridor.  No meningeal signs.   Cardiovascular: Normal rate, regular rhythm. Good peripheral circulation. Grossly normal heart sounds.   Respiratory: Normal respiratory effort.  No retractions. Lungs CTAB. Gastrointestinal: Soft and nontender. No distention.  Musculoskeletal: No lower extremity tenderness nor edema. No gross deformities of extremities. Neurologic:  Normal speech and language. No gross focal neurologic deficits are appreciated.  Skin:  Skin is warm, dry and intact. No rash noted.  ____________________________________________   LABS (all labs ordered are listed, but only abnormal results are displayed)  Labs Reviewed  CBC WITH DIFFERENTIAL/PLATELET - Abnormal; Notable for the following components:      Result Value   Hemoglobin 11.5 (*)    MCH 24.5 (*)    MCHC 29.8 (*)    All other components within normal limits  COMPREHENSIVE METABOLIC PANEL - Abnormal; Notable for the following components:   Glucose, Bld 104 (*)    Creatinine, Ser 1.03 (*)    GFR calc non Af Amer 52 (*)    All other components within normal limits  I-STAT TROPONIN, ED   ____________________________________________  EKG   EKG Interpretation  Date/Time:  Friday July 16 2018 04:34:51 EST Ventricular Rate:  76 PR Interval:    QRS Duration: 92 QT Interval:  385 QTC Calculation: 433 R Axis:   43 Text Interpretation:  Sinus rhythm Borderline  repolarization abnormality No significant change since last tracing Confirmed by Marily MemosMesner, Rylynn Schoneman [phone]([id number]) on 07/16/2018 5:19:15 AM       ____________________________________________   INITIAL IMPRESSION / ASSESSMENT AND PLAN / ED COURSE  Sinus rhythm here. Observed for >1 hour and no recurrence of symptoms. Labs unremarkable.   Pertinent labs & imaging results that were available during my care of the patient were reviewed by me and considered in my medical decision making (see chart for details).  ____________________________________________  FINAL CLINICAL IMPRESSION(S) / ED DIAGNOSES  Final diagnoses:  Palpitations    MEDICATIONS GIVEN DURING THIS VISIT:  Medications - No data to display   NEW OUTPATIENT MEDICATIONS STARTED DURING THIS VISIT:  Current Discharge Medication List      Note:  This note was prepared with assistance of Dragon voice recognition software. Occasional wrong-word or sound-a-like substitutions may have occurred due to the inherent limitations of voice recognition software.   Marily MemosMesner, Colbin Jovel, MD 07/16/18 [phone]

## 2018-07-16 NOTE — ED Triage Notes (Signed)
Pt in from home via [facility] with c/o heart palpitations and tachycardia in 150's-160's, began while doing house cleaning. Pt took Metoprolol and Flecanide prior to EMS arrival. When EMS arrived, pt's HR was 110. Denies cp, n/v or sob. Hx of afib

## 2018-07-25 ENCOUNTER — Other Ambulatory Visit: Payer: Self-pay

## 2018-07-25 ENCOUNTER — Emergency Department (HOSPITAL_COMMUNITY): Payer: Medicare Other

## 2018-07-25 ENCOUNTER — Observation Stay (HOSPITAL_BASED_OUTPATIENT_CLINIC_OR_DEPARTMENT_OTHER): Payer: Medicare Other

## 2018-07-25 ENCOUNTER — Other Ambulatory Visit: Payer: Self-pay | Admitting: Medical

## 2018-07-25 ENCOUNTER — Observation Stay (HOSPITAL_COMMUNITY)
Admission: EM | Admit: 2018-07-25 | Discharge: 2018-07-25 | Disposition: A | Discharge status: Home / Self Care | Payer: Medicare Other | Attending: Internal Medicine | Admitting: Internal Medicine

## 2018-07-25 ENCOUNTER — Encounter (HOSPITAL_COMMUNITY): Payer: Self-pay | Admitting: Oncology

## 2018-07-25 DIAGNOSIS — R079 Chest pain, unspecified: Secondary | ICD-10-CM | POA: Diagnosis present

## 2018-07-25 DIAGNOSIS — Z7901 Long term (current) use of anticoagulants: Secondary | ICD-10-CM | POA: Diagnosis not present

## 2018-07-25 DIAGNOSIS — I208 Other forms of angina pectoris: Secondary | ICD-10-CM

## 2018-07-25 DIAGNOSIS — R071 Chest pain on breathing: Secondary | ICD-10-CM | POA: Diagnosis not present

## 2018-07-25 DIAGNOSIS — I480 Paroxysmal atrial fibrillation: Secondary | ICD-10-CM | POA: Diagnosis not present

## 2018-07-25 DIAGNOSIS — Z79899 Other long term (current) drug therapy: Secondary | ICD-10-CM | POA: Diagnosis not present

## 2018-07-25 DIAGNOSIS — E782 Mixed hyperlipidemia: Secondary | ICD-10-CM | POA: Diagnosis not present

## 2018-07-25 DIAGNOSIS — R0789 Other chest pain: Secondary | ICD-10-CM

## 2018-07-25 DIAGNOSIS — E7800 Pure hypercholesterolemia, unspecified: Secondary | ICD-10-CM | POA: Diagnosis not present

## 2018-07-25 DIAGNOSIS — I259 Chronic ischemic heart disease, unspecified: Secondary | ICD-10-CM

## 2018-07-25 DIAGNOSIS — I10 Essential (primary) hypertension: Secondary | ICD-10-CM | POA: Diagnosis not present

## 2018-07-25 DIAGNOSIS — I2089 Other forms of angina pectoris: Secondary | ICD-10-CM | POA: Diagnosis present

## 2018-07-25 LAB — I-STAT TROPONIN, ED: Troponin i, poc: 0 ng/mL (ref 0.00–0.08)

## 2018-07-25 LAB — TROPONIN I
Troponin I: <0.03 ng/mL (ref <0.03)
Troponin I: <0.03 ng/mL (ref <0.03)
Troponin I: <0.03 ng/mL (ref <0.03)

## 2018-07-25 LAB — BASIC METABOLIC PANEL
Anion gap: 8 (ref 5–15)
BUN: 7 mg/dL — AB (ref 8–23)
CHLORIDE: 102 mmol/L (ref 98–111)
CO2: 25 mmol/L (ref 22–32)
Calcium: 8.9 mg/dL (ref 8.9–10.3)
Creatinine, Ser: 0.76 mg/dL (ref 0.44–1.00)
GFR calc Af Amer: >60 mL/min (ref >60)
GFR calc non Af Amer: >60 mL/min (ref >60)
GLUCOSE: 108 mg/dL — AB (ref 70–99)
POTASSIUM: 3.4 mmol/L — AB (ref 3.5–5.1)
Sodium: 135 mmol/L (ref 135–145)

## 2018-07-25 LAB — CBC WITH DIFFERENTIAL/PLATELET
Abs Immature Granulocytes: 0.01 10*3/uL (ref 0.00–0.07)
BASOS PCT: 0 %
Basophils Absolute: 0 10*3/uL (ref 0.0–0.1)
Eosinophils Absolute: 0 10*3/uL (ref 0.0–0.5)
Eosinophils Relative: 0 %
HCT: 34.6 % — ABNORMAL LOW (ref 36.0–46.0)
HEMOGLOBIN: 10.7 g/dL — AB (ref 12.0–15.0)
Immature Granulocytes: 0 %
LYMPHS PCT: 34 %
Lymphs Abs: 1.2 10*3/uL (ref 0.7–4.0)
MCH: 25.2 pg — AB (ref 26.0–34.0)
MCHC: 30.9 g/dL (ref 30.0–36.0)
MCV: 81.4 fL (ref 80.0–100.0)
MONO ABS: 0.3 10*3/uL (ref 0.1–1.0)
MONOS PCT: 8 %
NEUTROS ABS: 2 10*3/uL (ref 1.7–7.7)
Neutrophils Relative %: 58 %
Platelets: 176 10*3/uL (ref 150–400)
RBC: 4.25 MIL/uL (ref 3.87–5.11)
RDW: 14.6 % (ref 11.5–15.5)
WBC: 3.4 10*3/uL — ABNORMAL LOW (ref 4.0–10.5)
nRBC: 0 % (ref 0.0–0.2)

## 2018-07-25 LAB — GLUCOSE, CAPILLARY: Glucose-Capillary: 160 mg/dL — ABNORMAL HIGH (ref 70–99)

## 2018-07-25 LAB — ECHOCARDIOGRAM COMPLETE
Height: 64 in
WEIGHTICAEL: 1824 [oz_av]

## 2018-07-25 MED ORDER — ACETAMINOPHEN 325 MG PO TABS: 650.0000 mg | ORAL_TABLET | Freq: Every day | ORAL | Status: DC | PRN

## 2018-07-25 MED ORDER — APIXABAN 5 MG PO TABS
5.0000 mg | ORAL_TABLET | Freq: Two times a day (BID) | ORAL | Status: DC
  Administered 2018-07-25: 5 mg via ORAL
  Filled 2018-07-25: qty 1

## 2018-07-25 MED ORDER — ENSURE ENLIVE PO LIQD
237.0000 mL | Freq: Every day | ORAL | Status: DC
  Administered 2018-07-25: 237 mL via ORAL

## 2018-07-25 MED ORDER — PANTOPRAZOLE SODIUM 40 MG PO TBEC
40.0000 mg | DELAYED_RELEASE_TABLET | Freq: Every day | ORAL | Status: DC
  Administered 2018-07-25: 40 mg via ORAL
  Filled 2018-07-25: qty 1

## 2018-07-25 MED ORDER — AMLODIPINE BESYLATE 10 MG PO TABS: 10.0000 mg | ORAL_TABLET | Freq: Every day | ORAL | Status: DC

## 2018-07-25 MED ORDER — ADULT MULTIVITAMIN W/MINERALS CH
1.0000 | ORAL_TABLET | Freq: Every day | ORAL | Status: DC
  Administered 2018-07-25: 1 via ORAL
  Filled 2018-07-25: qty 1

## 2018-07-25 MED ORDER — FLUTICASONE PROPIONATE 50 MCG/ACT NA SUSP
2.0000 | Freq: Every day | NASAL | Status: DC | PRN
  Filled 2018-07-25: qty 16

## 2018-07-25 MED ORDER — CLONIDINE HCL 0.1 MG PO TABS: 0.1000 mg | ORAL_TABLET | Freq: Every day | ORAL | Status: DC | PRN

## 2018-07-25 MED ORDER — LISINOPRIL 20 MG PO TABS
20.0000 mg | ORAL_TABLET | Freq: Every day | ORAL | Status: DC
  Administered 2018-07-25: 20 mg via ORAL
  Filled 2018-07-25: qty 1

## 2018-07-25 MED ORDER — SUCRALFATE 1 GM/10ML PO SUSP: 1.0000 g | Freq: Every day | ORAL | Status: DC | PRN

## 2018-07-25 MED ORDER — AMLODIPINE BESYLATE 10 MG PO TABS
10.0000 mg | ORAL_TABLET | Freq: Every day | ORAL | Status: DC
  Filled 2018-07-25: qty 1

## 2018-07-25 MED ORDER — ACETAMINOPHEN 325 MG PO TABS: 650.0000 mg | ORAL_TABLET | ORAL | Status: DC | PRN

## 2018-07-25 MED ORDER — FLECAINIDE ACETATE 50 MG PO TABS
50.0000 mg | ORAL_TABLET | Freq: Two times a day (BID) | ORAL | Status: DC
  Administered 2018-07-25: 50 mg via ORAL
  Filled 2018-07-25 (×2): qty 1

## 2018-07-25 MED ORDER — METOPROLOL SUCCINATE ER 50 MG PO TB24
50.0000 mg | ORAL_TABLET | Freq: Every day | ORAL | Status: DC
  Administered 2018-07-25: 50 mg via ORAL
  Filled 2018-07-25: qty 1

## 2018-07-25 MED ORDER — ATORVASTATIN CALCIUM 10 MG PO TABS: 20.0000 mg | ORAL_TABLET | Freq: Every day | ORAL | Status: DC

## 2018-07-25 MED ORDER — ONDANSETRON HCL 4 MG/2ML IJ SOLN: 4.0000 mg | Freq: Four times a day (QID) | INTRAMUSCULAR | Status: DC | PRN

## 2018-07-25 NOTE — Consult Note (Addendum)
Cardiology Consultation:   Patient ID: ADANELY REYNOSO; [id number]; 07-Jun-1941   Admit date: 07/25/2018 Date of Consult: 07/25/2018  Primary Care Provider: Patient, No Pcp Per Primary Cardiologist: Donna Lemma, MD Primary Electrophysiologist:  None   Patient Profile:   Donna Simmons is a 77 y.o. female with a PMH of HTN, HLD, PAF, and GERD, who is being seen today for the evaluation of chest pain at the request of Donna Simmons.  History of Present Illness:   Donna Simmons was in her usual state of health until yesterday evening when she developed chest tightness. Chest tightness was non-exertional, non-radiating, and without associated SOB, diaphoresis, N/V, dizziness, lightheadedness, or syncope. She took her blood pressure at that time and reported elevated BP of 200s/90s. She took her prn clonidine at that time but given high BP she activated EMS. BP was improved on EMS evaluation. She was transported to the ED and chest tightness eventually resolved with SL nitro in the ED.   She was last seen by cardiology outpatient, Donna Simmons, 01/2018 for routine follow-up of her HTN and PAF. Per his note, the only time she notes chest discomfort is when she is in atrial fibrillation. She reported a bad flair of her GERD and was back on a PPI. She had no cardiac complaints at that time and was recommended to restart lipitor 20mg  daily (discontinued by PCP). She has no prior CAD history. Her last ischemic evaluation was a NST in 2010 which was normal. Last echo in 2010 with EF 55%, moderate RV dilation, and moderate pulmonary hypertension.   At the time of this evaluation she is chest pain free. She is quite active at baseline and [facility], doing yard work, and recently has been cleaning out a house she and her husband just sold. She has been lifting boxes without any exertional chest pain or SOB. She denies orthopnea, PND, or LE edema. She has been compliant with her medications. No  problems with bleeding and no falls in the past 3 months.   Hospital course: VSS. Labs notable for K 3.4, Cr 0.76, Hgb 10.7, PLT 176, Trop negative x2. CXR with emphysematous changes but no acute findings. EKG with sinus rhythm with no STE/D, no TWI. She was admitted to medicine. Cardiology asked to evaluate for chest pain.   Past Medical History:  Diagnosis Date  . Allergy   . GERD (gastroesophageal reflux disease)   . Heart murmur    No significant valvular lesion noted on echo.  . Hypercholesteremia   . Hypertension   . Palpitations 01/10/2009   14 day monitor- some sinus tachycardia, PVCs  . Paroxysmal atrial fibrillation (HCC) 12/27/2008    Past Surgical History:  Procedure Laterality Date  . ABDOMINAL HYSTERECTOMY  1980  . BREAST SURGERY    . CARDIAC CATHETERIZATION  12/16/2010   no evidence of CAD to explain anginal pain Simmons/ positive troponin.  potential etiology is breakthrough AF  . EYE SURGERY Right 12/29/2016  . EYE SURGERY Left 01/19/2017  . FRACTURE SURGERY    . NM MYOVIEW LTD  April 2010   EF 64%, normal pattern of perfusion in all regions, no scintigraphic evidence of inducible ischemia; no significant wall motion abnormalities; EKG negative for ischemia; no significant change from last study; low risk scan  . TRANSTHORACIC ECHOCARDIOGRAM  April 2010   Echo - EF >55; RV mils/mod dilated, RV systolic function mildly reduced; RA mild/moderately dilated; moderate pulmonary hypertension; mild tricuspid regurgitation  Home Medications:  Prior to Admission medications   Medication Sig Start Date End Date Taking? Authorizing Provider  acetaminophen (TYLENOL) 325 MG tablet Take 650 mg by mouth daily as needed for headache (pain).   Yes [provider]  amLODipine (NORVASC) 10 MG tablet Take 10 mg by mouth daily.  09/14/17  Yes [provider]  atorvastatin (LIPITOR) 20 MG tablet Take 1 tablet (20 mg total) by mouth at bedtime. 02/01/18 07/16/26 Yes Donna LexHarding,  Donna W, MD  cloNIDine (CATAPRES) 0.1 MG tablet May take a tablet as needed for blood pressure greater than sytolic 170. Patient taking differently: Take 0.1 mg by mouth daily as needed (SBP >170).  08/03/17  Yes Donna LexHarding, Donna W, MD  ELIQUIS 5 MG TABS tablet TAKE 1 TABLET BY MOUTH TWICE DAILY Patient taking differently: Take 5 mg by mouth 2 (two) times daily.  04/20/18  Yes Donna LexHarding, Donna W, MD  ENSURE (ENSURE) Take 237 mLs by mouth daily.   Yes [provider]  flecainide (TAMBOCOR) 50 MG tablet TAKE ONE TABLET BY MOUTH TWICE DAILY Patient taking differently: Take 50 mg by mouth 2 (two) times daily.  07/09/17  Yes Donna HinesBerge, Donna Ronald, NP  fluticasone (FLONASE) 50 MCG/ACT nasal spray USE TWO SPRAY(S) IN EACH NOSTRIL ONCE DAILY Patient taking differently: Place 2 sprays into both nostrils daily as needed for allergies.  01/05/17  Yes Donna FloodGreene, Donna R, MD  lisinopril (PRINIVIL,ZESTRIL) 20 MG tablet TAKE 1 TABLET BY MOUTH ONCE DAILY Patient taking differently: Take 20 mg by mouth daily.  03/30/18  Yes Donna LexHarding, Donna W, MD  metoprolol succinate (TOPROL-XL) 50 MG 24 hr tablet Take 1 tablet (50 mg total) by mouth daily. Take with or immediately following a meal. 11/12/17 07/16/26 Yes Donna Simmons, Donna Simmons, Simmons  Multiple Vitamin (MULTIVITAMIN WITH MINERALS) TABS tablet Take 1 tablet by mouth daily with lunch. Centrum Silver   Yes [provider]  omeprazole (PRILOSEC) 20 MG capsule Take 1 capsule (20 mg total) by mouth 2 (two) times daily before a meal. Take before breakfast. Patient taking differently: Take 20 mg by mouth daily before breakfast.  11/12/17  Yes Donna Simmons, Donna Simmons, Simmons  sucralfate (CARAFATE) 1 GM/10ML suspension Take 10 mLs (1 g total) by mouth 4 (four) times daily -  with meals and at bedtime. Patient taking differently: Take 1 g by mouth daily as needed (when eating gas producing foods).  01/05/18  Yes Donna FloodGreene, Donna R, MD    Inpatient Medications: Scheduled Meds: .  amLODipine  10 mg Oral Daily  . apixaban  5 mg Oral BID  . atorvastatin  20 mg Oral QHS  . feeding supplement (ENSURE ENLIVE)  237 mL Oral Daily  . flecainide  50 mg Oral BID  . lisinopril  20 mg Oral Daily  . metoprolol succinate  50 mg Oral Daily  . multivitamin with minerals  1 tablet Oral Q lunch  . pantoprazole  40 mg Oral Daily   Continuous Infusions:  PRN Meds: acetaminophen, cloNIDine, fluticasone, ondansetron (ZOFRAN) IV, sucralfate  Allergies:    Allergies  Allergen Reactions  . Antihistamines, Chlorpheniramine-Type Palpitations    Makes heart beat fast    Social History:   Social History   Socioeconomic History  . Marital status: Married    Spouse name: Not on file  . Number of children: Not on file  . Years of education: Not on file  . Highest education level: Not on file  Occupational History  . Not on file  Social Needs  . Financial resource strain: Not on file  . Food insecurity:    Worry: Not on file    Inability: Not on file  . Transportation needs:    Medical: Not on file    Non-medical: Not on file  Tobacco Use  . Smoking status: Never Smoker  . Smokeless tobacco: Never Used  Substance and Sexual Activity  . Alcohol use: No    Alcohol/week: 0.0 standard drinks  . Drug use: No  . Sexual activity: Yes  Lifestyle  . Physical activity:    Days per week: Not on file    Minutes per session: Not on file  . Stress: Not on file  Relationships  . Social connections:    Talks on phone: Not on file    Gets together: Not on file    Attends religious service: Not on file    Active member of club or organization: Not on file    Attends meetings of clubs or organizations: Not on file    Relationship status: Not on file  . Intimate partner violence:    Fear of current or ex partner: Not on file    Emotionally abused: Not on file    Physically abused: Not on file    Forced sexual activity: Not on file  Other Topics Concern  . Not on file  Social  History Narrative   Married with no children. Walks several times a week at least 3-4 times a week about 2 miles a time. Does not smoke and does not drink.    Family History:    Family History  Problem Relation Age of Onset  . Arthritis Mother        OSTEO  . Hypertension Mother   . Dementia Mother   . Heart disease Father   . Hypertension Sister   . Dementia Sister   . Cancer - Lung Brother   . Cancer Maternal Grandmother   . Heart disease Maternal Grandfather   . Cancer - Lung Brother   . Hypertension Brother   . Cancer - Prostate Brother      ROS:  Please see the history of present illness.   All other ROS reviewed and negative.     Physical Exam/Data:   Vitals:   07/25/18 0215 07/25/18 0230 07/25/18 0315 07/25/18 0356  BP: 129/68 (!) 146/73 110/63 138/86  Pulse: 61 67 (!) 57 (!) 57  Resp: 18 13    Temp:    (!) 97.5 F (36.4 C)  TempSrc:    Oral  SpO2: 99% 100% 99% 95%  Weight:    51.7 kg  Height:    5\' 4"  (1.626 m)   No intake or output data in the 24 hours ending 07/25/18 0809 Filed Weights   07/25/18 0126 07/25/18 0356  Weight: 51.7 kg 51.7 kg   Body mass index is 19.57 kg/m.  General:  Well nourished, well developed, thin AAF sitting on the edge of the hospital bed in no acute distress HEENT: sclera anicteric  Neck: no JVD Vascular: No carotid bruits; distal pulses 2+ bilaterally Cardiac:  normal S1, S2; RRR; no murmurs, rubs, or gallops Lungs:  clear to auscultation bilaterally, no wheezing, rhonchi or rales  Abd: NABS, soft, nontender, no hepatomegaly Ext: no edema Musculoskeletal:  No deformities, BUE and BLE strength normal and equal Skin: warm and dry  Neuro:  CNs 2-12 intact, no focal abnormalities noted Psych:  Normal affect   EKG:  The EKG was personally  reviewed and demonstrates:  Sinus rhythm with rate 65 bpm, no STE/D, no TWI Telemetry:  Telemetry was personally reviewed and demonstrates:  Sinus rhythm with occasional   Relevant CV  Studies: NST 2010: without ischemia  Echocardiogram 2010: EF 55%, moderately dilated RV, moderate pulmonary HTN   Laboratory Data:  Chemistry Recent Labs  Lab 07/25/18 0149  NA 135  K 3.4*  CL 102  CO2 25  GLUCOSE 108*  BUN 7*  CREATININE 0.76  CALCIUM 8.9  GFRNONAA >60  GFRAA >60  ANIONGAP 8    No results for input(s): PROT, ALBUMIN, AST, ALT, ALKPHOS, BILITOT in the last 168 hours. Hematology Recent Labs  Lab 07/25/18 0149  WBC 3.4*  RBC 4.25  HGB 10.7*  HCT 34.6*  MCV 81.4  MCH 25.2*  MCHC 30.9  RDW 14.6  PLT 176   Cardiac Enzymes Recent Labs  Lab 07/25/18 0430  TROPONINI <0.03    Recent Labs  Lab 07/25/18 0141  TROPIPOC 0.00    BNPNo results for input(s): BNP, PROBNP in the last 168 hours.  DDimer No results for input(s): DDIMER in the last 168 hours.  Radiology/Studies:  Dg Chest 2 View  Result Date: 07/25/2018 CLINICAL DATA:  Central chest pain with radiation starting about 1 hour ago. Hypertension. EXAM: CHEST - 2 VIEW COMPARISON:  02/15/2018 FINDINGS: Heart size and pulmonary vascularity are normal. Emphysematous changes in the lungs. Scattered fibrosis and peribronchial thickening consistent with chronic bronchitis. No airspace disease or consolidation. No blunting of costophrenic angles. No pneumothorax. Mediastinal contours appear intact. Calcified and tortuous aorta. IMPRESSION: Emphysematous and chronic bronchitic changes in the lungs. No evidence of active pulmonary disease. Electronically Signed   By: Burman Nieves M.D.   On: 07/25/2018 02:16    Assessment and Plan:   1. Chest pain: patient reported chest tightness in the setting of elevated BP (200s/90s), for which she took her prn clonidine. Pain eventually resolved with administration of SL nitro in the ED. No recent exertional chest pain/SOB despite being more active than usual in the last several weeks cleaning out a property she and her husband sold. Troponins are negative x2. EKG  without ischemic changes; NSR. She has not had any ischemic testing in the last 9 years. Risk factors for heart disease include HTN, HLD, and family history of CAD (father with MI at age 34).  - Echo pending - if normal EF and no significant wall motion abnormalities, do not anticipate further inpatient testing. - She would be a great candidate for an outpatient coronary CTA to further evaluate coronary anatomy.  - Continue risk factor modifications with good BP and cholesterol control.   2. HTN: BP overall stable. CP occurred in the setting of elevated BP. She takes prn clonidine in those instances, however this was the first time in a long time she has needed to do so.  - Continue amlodipine, lisinopril, metoprolol, and prn clonidine  3. Paroxysmal atrial fibrillation: maintaining sinus rhythm this admission. No bleeding issues with eliquis.  - Continue metoprolol and flecainide for rate/rhythm control - Continue eliquis for stroke ppx (CHA2DS2-VASc Score of 4 - HTN, Female, Age >75)   4. HLD: LDL 138 01/2018 at which time her lipitor was restarted at 20mg  daily - Continue statin    For questions or updates, please contact [facility] Please consult [email or web address] for contact info under Cardiology/STEMI.   Signed, Beatriz Stallion, Simmons  07/25/2018 8:09 AM [phone]  Cardiology Attending  Patient seen and examined.  Agree with above. The patient is a pleasant 76 yo woman with HTN, PAF, dyslipidemia who presented to the hospital with chest pressure in the setting of very high blood pressure. She denies medical or dietary non-compliance. She feels well, ruled out for MI, and her ECG looks good. As she is doing well, she can be discharged home without inpatient stress testing and we will schedule a coronary CT scan as an outpatient. She will followup with Dr. Dorethea Clan. We will sign off.   Leonia Reeves.D.

## 2018-07-25 NOTE — Discharge Instructions (Signed)

## 2018-07-25 NOTE — H&P (Signed)
History and Physical   Donna Simmons NWG:[id number] DOB: 03/11/1941 DOA: 07/25/2018  Referring MD/NP/PA: Dr ward  PCP: Patient, No Pcp Per   Outpatient Specialists:  Dr Bryan Lemma, MD Cardiology  Patient coming from: Home  Chief Complaint: Chest pain  HPI: Donna Simmons is a 77 y.o. female with medical history significant of hyperlipidemia, hypertension, paroxysmal atrial fibrillation on chronic anticoagulation, GERD who presents to the ER with recurrent chest pain in the last 12 hours.  Pain is more pressure in the middle of the chest.  It got worse around midnight.  Blood pressure at that time rose to 200/120.  Patient took some aspirin.  She given nitroglycerin in the ER and chest pressure and pain is currently gone.  Denied any shortness of breath, no diaphoresis.  Symptoms were worse with activities but not relieved by anything except nitroglycerin.  No prior history of coronary artery disease.  Patient has had GERD but this did not feel like GERD symptoms.  She was seen in the ER and based on her risk factors for coronary artery disease she is being admitted for rule out MI.   ED Course: Temperature is 97.6 blood pressure now down to 137/94 pulse 69 respirate of 18 oxygen sats 100% room air.  She has a white count of 3.4 hemoglobin 10.7 and platelet 176.  Sodium is 135 potassium 3.4 chloride 102 CO2 25 with BUN of 7.  Glucose 108.  EKG showed sinus rhythm with a rate of 75.  Initial cardiac enzymes are negative.  Chest x-ray showed no active disease.  Review of Systems: As per HPI otherwise 10 point review of systems negative.    Past Medical History:  Diagnosis Date  . Allergy   . GERD (gastroesophageal reflux disease)   . Heart murmur    No significant valvular lesion noted on echo.  . Hypercholesteremia   . Hypertension   . Palpitations 01/10/2009   14 day monitor- some sinus tachycardia, PVCs  . Paroxysmal atrial fibrillation (HCC) 12/27/2008    Past Surgical  History:  Procedure Laterality Date  . ABDOMINAL HYSTERECTOMY  1980  . BREAST SURGERY    . CARDIAC CATHETERIZATION  12/16/2010   no evidence of CAD to explain anginal pain w/ positive troponin.  potential etiology is breakthrough AF  . EYE SURGERY Right 12/29/2016  . EYE SURGERY Left 01/19/2017  . FRACTURE SURGERY    . NM MYOVIEW LTD  April 2010   EF 64%, normal pattern of perfusion in all regions, no scintigraphic evidence of inducible ischemia; no significant wall motion abnormalities; EKG negative for ischemia; no significant change from last study; low risk scan  . TRANSTHORACIC ECHOCARDIOGRAM  April 2010   Echo - EF >55; RV mils/mod dilated, RV systolic function mildly reduced; RA mild/moderately dilated; moderate pulmonary hypertension; mild tricuspid regurgitation     reports that she has never smoked. She has never used smokeless tobacco. She reports that she does not drink alcohol or use drugs.  Allergies  Allergen Reactions  . Antihistamines, Chlorpheniramine-Type Palpitations    Makes heart beat fast    Family History  Problem Relation Age of Onset  . Arthritis Mother        OSTEO  . Hypertension Mother   . Dementia Mother   . Heart disease Father   . Hypertension Sister   . Dementia Sister   . Cancer - Lung Brother   . Cancer Maternal Grandmother   . Heart disease Maternal Grandfather   .  Cancer - Lung Brother   . Hypertension Brother   . Cancer - Prostate Brother      Prior to Admission medications   Medication Sig Start Date End Date Taking? Authorizing Provider  acetaminophen (TYLENOL) 325 MG tablet Take 650 mg by mouth daily as needed for headache (pain).   Yes [provider]  amLODipine (NORVASC) 10 MG tablet Take 10 mg by mouth daily.  09/14/17  Yes [provider]  atorvastatin (LIPITOR) 20 MG tablet Take 1 tablet (20 mg total) by mouth at bedtime. 02/01/18 07/16/26 Yes Marykay LexHarding, David W, MD  cloNIDine (CATAPRES) 0.1 MG tablet May take a  tablet as needed for blood pressure greater than sytolic 170. Patient taking differently: Take 0.1 mg by mouth daily as needed (SBP >170).  08/03/17  Yes Marykay LexHarding, David W, MD  ELIQUIS 5 MG TABS tablet TAKE 1 TABLET BY MOUTH TWICE DAILY Patient taking differently: Take 5 mg by mouth 2 (two) times daily.  04/20/18  Yes Marykay LexHarding, David W, MD  ENSURE (ENSURE) Take 237 mLs by mouth daily.   Yes [provider]  flecainide (TAMBOCOR) 50 MG tablet TAKE ONE TABLET BY MOUTH TWICE DAILY Patient taking differently: Take 50 mg by mouth 2 (two) times daily.  07/09/17  Yes Creig HinesBerge, Christopher Ronald, NP  fluticasone (FLONASE) 50 MCG/ACT nasal spray USE TWO SPRAY(S) IN EACH NOSTRIL ONCE DAILY Patient taking differently: Place 2 sprays into both nostrils daily as needed for allergies.  01/05/17  Yes Shade FloodGreene, Jeffrey R, MD  lisinopril (PRINIVIL,ZESTRIL) 20 MG tablet TAKE 1 TABLET BY MOUTH ONCE DAILY Patient taking differently: Take 20 mg by mouth daily.  03/30/18  Yes Marykay LexHarding, David W, MD  metoprolol succinate (TOPROL-XL) 50 MG 24 hr tablet Take 1 tablet (50 mg total) by mouth daily. Take with or immediately following a meal. 11/12/17 07/16/26 Yes Ofilia Neaslark, Michael L, PA-C  Multiple Vitamin (MULTIVITAMIN WITH MINERALS) TABS tablet Take 1 tablet by mouth daily with lunch. Centrum Silver   Yes [provider]  omeprazole (PRILOSEC) 20 MG capsule Take 1 capsule (20 mg total) by mouth 2 (two) times daily before a meal. Take before breakfast. Patient taking differently: Take 20 mg by mouth daily before breakfast.  11/12/17  Yes Ofilia Neaslark, Michael L, PA-C  sucralfate (CARAFATE) 1 GM/10ML suspension Take 10 mLs (1 g total) by mouth 4 (four) times daily -  with meals and at bedtime. Patient taking differently: Take 1 g by mouth daily as needed (when eating gas producing foods).  01/05/18  Yes Shade FloodGreene, Jeffrey R, MD    Physical Exam: Vitals:   07/25/18 0130 07/25/18 0145 07/25/18 0200 07/25/18 0215  BP: 133/82 119/65  109/61 129/68  Pulse: 63 (!) 58 (!) 58 61  Resp: 17 11 14 18   Temp:      TempSrc:      SpO2: 100% 100% 100% 99%  Weight:      Height:          Constitutional: NAD, calm, comfortable Vitals:   07/25/18 0130 07/25/18 0145 07/25/18 0200 07/25/18 0215  BP: 133/82 119/65 109/61 129/68  Pulse: 63 (!) 58 (!) 58 61  Resp: 17 11 14 18   Temp:      TempSrc:      SpO2: 100% 100% 100% 99%  Weight:      Height:       Eyes: PERRL, lids and conjunctivae normal ENMT: Mucous membranes are moist. Posterior pharynx clear of any exudate or lesions.Normal dentition.  Neck: normal,  supple, no masses, no thyromegaly Respiratory: clear to auscultation bilaterally, no wheezing, no crackles. Normal respiratory effort. No accessory muscle use.  Cardiovascular: Regular rate and rhythm, no murmurs / rubs / gallops. No extremity edema. 2+ pedal pulses. No carotid bruits.  Abdomen: no tenderness, no masses palpated. No hepatosplenomegaly. Bowel sounds positive.  Musculoskeletal: no clubbing / cyanosis. No joint deformity upper and lower extremities. Good ROM, no contractures. Normal muscle tone.  Skin: no rashes, lesions, ulcers. No induration Neurologic: CN 2-12 grossly intact. Sensation intact, DTR normal. Strength 5/5 in all 4.  Psychiatric: Normal judgment and insight. Alert and oriented x 3. Normal mood.     Labs on Admission: I have personally reviewed following labs and imaging studies  CBC: Recent Labs  Lab 07/25/18 0149  WBC 3.4*  NEUTROABS 2.0  HGB 10.7*  HCT 34.6*  MCV 81.4  PLT 176   Basic Metabolic Panel: Recent Labs  Lab 07/25/18 0149  NA 135  K 3.4*  CL 102  CO2 25  GLUCOSE 108*  BUN 7*  CREATININE 0.76  CALCIUM 8.9   GFR: Estimated Creatinine Clearance: 49.6 mL/min (by C-G formula based on SCr of 0.76 mg/dL). Liver Function Tests: No results for input(s): AST, ALT, ALKPHOS, BILITOT, PROT, ALBUMIN in the last 168 hours. No results for input(s): LIPASE, AMYLASE in the  last 168 hours. No results for input(s): AMMONIA in the last 168 hours. Coagulation Profile: No results for input(s): INR, PROTIME in the last 168 hours. Cardiac Enzymes: No results for input(s): CKTOTAL, CKMB, CKMBINDEX, TROPONINI in the last 168 hours. BNP (last 3 results) No results for input(s): PROBNP in the last 8760 hours. HbA1C: No results for input(s): HGBA1C in the last 72 hours. CBG: No results for input(s): GLUCAP in the last 168 hours. Lipid Profile: No results for input(s): CHOL, HDL, LDLCALC, TRIG, CHOLHDL, LDLDIRECT in the last 72 hours. Thyroid Function Tests: No results for input(s): TSH, T4TOTAL, FREET4, T3FREE, THYROIDAB in the last 72 hours. Anemia Panel: No results for input(s): VITAMINB12, FOLATE, FERRITIN, TIBC, IRON, RETICCTPCT in the last 72 hours. Urine analysis:    Component Value Date/Time   COLORURINE STRAW (A) 02/15/2018 1627   APPEARANCEUR CLEAR 02/15/2018 1627   LABSPEC 1.005 02/15/2018 1627   PHURINE 9.0 (H) 02/15/2018 1627   GLUCOSEU NEGATIVE 02/15/2018 1627   HGBUR SMALL (A) 02/15/2018 1627   BILIRUBINUR NEGATIVE 02/15/2018 1627   BILIRUBINUR negative 06/03/2016 1542   BILIRUBINUR neg 07/21/2014 1622   KETONESUR NEGATIVE 02/15/2018 1627   PROTEINUR NEGATIVE 02/15/2018 1627   UROBILINOGEN 0.2 06/03/2016 1542   UROBILINOGEN 0.2 12/20/2015 1535   NITRITE NEGATIVE 02/15/2018 1627   LEUKOCYTESUR NEGATIVE 02/15/2018 1627   Sepsis Labs: @LABRCNTIP (procalcitonin:4,lacticidven:4) )No results found for this or any previous visit (from the past 240 hour(s)).   Radiological Exams on Admission: Dg Chest 2 View  Result Date: 07/25/2018 CLINICAL DATA:  Central chest pain with radiation starting about 1 hour ago. Hypertension. EXAM: CHEST - 2 VIEW COMPARISON:  02/15/2018 FINDINGS: Heart size and pulmonary vascularity are normal. Emphysematous changes in the lungs. Scattered fibrosis and peribronchial thickening consistent with chronic bronchitis. No  airspace disease or consolidation. No blunting of costophrenic angles. No pneumothorax. Mediastinal contours appear intact. Calcified and tortuous aorta. IMPRESSION: Emphysematous and chronic bronchitic changes in the lungs. No evidence of active pulmonary disease. Electronically Signed   By: Burman Nieves M.D.   On: 07/25/2018 02:16    EKG: Independently reviewed.  Normal sinus rhythm with a rate of  75.  Nonspecific ST changes  Assessment/Plan Active Problems:   Paroxysmal atrial fibrillation (HCC) - CHA2DS2-VASc Score 3, on Eliquis   Essential hypertension   Hypercholesteremia   Long term current use of anticoagulant therapy   Chest pain     #1 chest pain: Patient will be admitted for rule out MI.  She has risk factors for coronary artery disease including hypertension hyperlipidemia.  She has history of A. fib but rate control and EKG showing normal sinus rhythm.  We will maintain her on her home regimen cycle enzymes get echocardiogram.  #2 paroxysmal atrial fibrillation: Patient is currently on Eliquis.  Continue.  Rate is controlled.  #3 hyperlipidemia: Continue with statin.  #4 hypertension: Blood pressure at this point has come down.  Continue current home regimen.   DVT prophylaxis: Eliquis Code Status: Full code Family Communication: No family available  Disposition Plan: Home Consults called: None Admission status: Observation  Severity of Illness: The appropriate patient status for this patient is OBSERVATION. Observation status is judged to be reasonable and necessary in order to provide the required intensity of service to ensure the patient's safety. The patient's presenting symptoms, physical exam findings, and initial radiographic and laboratory data in the context of their medical condition is felt to place them at decreased risk for further clinical deterioration. Furthermore, it is anticipated that the patient will be medically stable for discharge from the  hospital within 2 midnights of admission. The following factors support the patient status of observation.   " The patient's presenting symptoms include chest pain. " The physical exam findings include no specific. " The initial radiographic and laboratory data are normal x-rays and labs.     Lonia Blood MD [facility] Pager [phone]  If 7PM-7AM, please contact night-coverage [email or web address] Password [email or web address]  07/25/2018, 2:43 AM

## 2018-07-25 NOTE — Progress Notes (Signed)
  Echocardiogram 2D Echocardiogram has been performed.  Donna Simmons, [name] M 07/25/2018, 2:06 PM

## 2018-07-25 NOTE — Discharge Summary (Signed)
Donna Simmons, is a 77 y.o. female  DOB Jun 30, 1941  MRN [id number].  Admission date:  07/25/2018  Admitting Physician  Rometta Emery, MD  Discharge Date:  07/25/2018   Primary MD  Patient, No Pcp Per  Recommendations for primary care physician for things to follow:  -Patient to follow with cardiology as an outpatient regarding CTA coronaries.   Admission Diagnosis  Other chest pain [R07.89]   Discharge Diagnosis  Other chest pain [R07.89]    Active Problems:   Paroxysmal atrial fibrillation (HCC) - CHA2DS2-VASc Score 3, on Eliquis   Essential hypertension   Hypercholesteremia   Long term current use of anticoagulant therapy   Chest pain      Past Medical History:  Diagnosis Date  . Allergy   . GERD (gastroesophageal reflux disease)   . Heart murmur    No significant valvular lesion noted on echo.  . Hypercholesteremia   . Hypertension   . Palpitations 01/10/2009   14 day monitor- some sinus tachycardia, PVCs  . Paroxysmal atrial fibrillation (HCC) 12/27/2008    Past Surgical History:  Procedure Laterality Date  . ABDOMINAL HYSTERECTOMY  1980  . BREAST SURGERY    . CARDIAC CATHETERIZATION  12/16/2010   no evidence of CAD to explain anginal pain w/ positive troponin.  potential etiology is breakthrough AF  . EYE SURGERY Right 12/29/2016  . EYE SURGERY Left 01/19/2017  . FRACTURE SURGERY    . NM MYOVIEW LTD  April 2010   EF 64%, normal pattern of perfusion in all regions, no scintigraphic evidence of inducible ischemia; no significant wall motion abnormalities; EKG negative for ischemia; no significant change from last study; low risk scan  . TRANSTHORACIC ECHOCARDIOGRAM  April 2010   Echo - EF >55; RV mils/mod dilated, RV systolic function mildly reduced; RA mild/moderately dilated; moderate pulmonary hypertension; mild tricuspid regurgitation       History of present illness  and  Hospital Course:     Kindly see H&P for history of present illness and admission details, please review complete Labs, Consult reports and Test reports for all details in brief  HPI  from the history and physical done on the day of admission 07/25/2018  HPI: Donna Simmons is a 77 y.o. female with medical history significant of hyperlipidemia, hypertension, paroxysmal atrial fibrillation on chronic anticoagulation, GERD who presents to the ER with recurrent chest pain in the last 12 hours.  Pain is more pressure in the middle of the chest.  It got worse around midnight.  Blood pressure at that time rose to 200/120.  Patient took some aspirin.  She given nitroglycerin in the ER and chest pressure and pain is currently gone.  Denied any shortness of breath, no diaphoresis.  Symptoms were worse with activities but not relieved by anything except nitroglycerin.  No prior history of coronary artery disease.  Patient has had GERD but this did not feel like GERD symptoms.  She was seen in the ER and based on her risk factors  for coronary artery disease she is being admitted for rule out MI.   ED Course: Temperature is 97.6 blood pressure now down to 137/94 pulse 69 respirate of 18 oxygen sats 100% room air.  She has a white count of 3.4 hemoglobin 10.7 and platelet 176.  Sodium is 135 potassium 3.4 chloride 102 CO2 25 with BUN of 7.  Glucose 108.  EKG showed sinus rhythm with a rate of 75.  Initial cardiac enzymes are negative.  Chest x-ray showed no active disease.   Hospital Course   Chest pain -Pain in the setting of elevated blood pressure he has ruled out with negative troponins, neurology input greatly appreciated, plan for CTA coronary as an outpatient, EKG nonacute, negative troponins -2D echo was obtained, has a preserved EF, with no evidence of wall motion abnormality,  paroxysmal atrial fibrillation: Patient is currently on Eliquis.  Continue.  Rate is controlled.  hyperlipidemia:  Continue with statin.  hypertension: Continue current home regimen.  Per [place]: BP overall stable. CP occurred in the setting of elevated BP. She takes prn clonidine in those instances, however this was the first time in a long time she has needed to do so.  - Continue amlodipine, lisinopril, metoprolol, and prn clonidine   Discharge Condition: stable   Follow UP  Follow-up Information    [facility] Follow up.   Specialty:  Cardiology Why:  The office will contact you directly to coordinate an outpatient CT scan of your heart and follow-up with Dr. Herbie Baltimore.  Contact information: [address] [place] [address] [phone]            Discharge Instructions  and  Discharge Medications     Discharge Instructions    Discharge instructions   Complete by:  As directed    Follow with Primary MD Patient, No Pcp Per in 7 days   Get CBC, CMP,checked  by Primary MD next visit.    Activity: As tolerated with Full fall precautions use walker/cane & assistance as needed   Disposition Home    Diet: Heart Healthy  , with feeding assistance and aspiration precautions.  For Heart failure patients - Check your Weight same time everyday, if you gain over 2 pounds, or you develop in leg swelling, experience more shortness of breath or chest pain, call your Primary MD immediately. Follow Cardiac Low Salt Diet and 1.5 lit/day fluid restriction.   On your next visit with your primary care physician please Get Medicines reviewed and adjusted.   Please request your Prim.MD to go over all Hospital Tests and Procedure/Radiological results at the follow up, please get all Hospital records sent to your Prim MD by signing hospital release before you go home.   If you experience worsening of your admission symptoms, develop shortness of breath, life threatening emergency, suicidal or homicidal thoughts you must seek medical attention immediately  by calling 911 or calling your MD immediately  if symptoms less severe.  You Must read complete instructions/literature along with all the possible adverse reactions/side effects for all the Medicines you take and that have been prescribed to you. Take any new Medicines after you have completely understood and accpet all the possible adverse reactions/side effects.   Do not drive, operating heavy machinery, perform activities at heights, swimming or participation in water activities or provide baby sitting services if your were admitted for syncope or siezures until you have seen by Primary MD or a Neurologist and advised to do  so again.  Do not drive when taking Pain medications.    Do not take more than prescribed Pain, Sleep and Anxiety Medications  Special Instructions: If you have smoked or chewed Tobacco  in the last 2 yrs please stop smoking, stop any regular Alcohol  and or any Recreational drug use.  Wear Seat belts while driving.   Please note  You were cared for by a hospitalist during your hospital stay. If you have any questions about your discharge medications or the care you received while you were in the hospital after you are discharged, you can call the unit and asked to speak with the hospitalist on call if the hospitalist that took care of you is not available. Once you are discharged, your primary care physician will handle any further medical issues. Please note that NO REFILLS for any discharge medications will be authorized once you are discharged, as it is imperative that you return to your primary care physician (or establish a relationship with a primary care physician if you do not have one) for your aftercare needs so that they can reassess your need for medications and monitor your lab values.   Increase activity slowly   Complete by:  As directed      Allergies as of 07/25/2018      Reactions   Antihistamines, Chlorpheniramine-type Palpitations   Makes heart  beat fast      Medication List    TAKE these medications   acetaminophen 325 MG tablet Commonly known as:  TYLENOL Take 650 mg by mouth daily as needed for headache (pain).   amLODipine 10 MG tablet Commonly known as:  NORVASC Take 10 mg by mouth daily.   atorvastatin 20 MG tablet Commonly known as:  LIPITOR Take 1 tablet (20 mg total) by mouth at bedtime.   cloNIDine 0.1 MG tablet Commonly known as:  CATAPRES May take a tablet as needed for blood pressure greater than sytolic 170. What changed:    how much to take  how to take this  when to take this  reasons to take this  additional instructions   ELIQUIS 5 MG Tabs tablet Generic drug:  apixaban TAKE 1 TABLET BY MOUTH TWICE DAILY What changed:  how much to take   ENSURE Take 237 mLs by mouth daily.   flecainide 50 MG tablet Commonly known as:  TAMBOCOR TAKE ONE TABLET BY MOUTH TWICE DAILY   fluticasone 50 MCG/ACT nasal spray Commonly known as:  FLONASE USE TWO SPRAY(S) IN EACH NOSTRIL ONCE DAILY What changed:  See the new instructions.   lisinopril 20 MG tablet Commonly known as:  PRINIVIL,ZESTRIL TAKE 1 TABLET BY MOUTH ONCE DAILY   metoprolol succinate 50 MG 24 hr tablet Commonly known as:  TOPROL-XL Take 1 tablet (50 mg total) by mouth daily. Take with or immediately following a meal.   multivitamin with minerals Tabs tablet Take 1 tablet by mouth daily with lunch. Centrum Silver   omeprazole 20 MG capsule Commonly known as:  PRILOSEC Take 1 capsule (20 mg total) by mouth 2 (two) times daily before a meal. Take before breakfast. What changed:    when to take this  additional instructions   sucralfate 1 GM/10ML suspension Commonly known as:  CARAFATE Take 10 mLs (1 g total) by mouth 4 (four) times daily -  with meals and at bedtime. What changed:    when to take this  reasons to take this  Diet and Activity recommendation: See Discharge Instructions above   Consults  obtained -  cardiology   Major procedures and Radiology Reports - PLEASE review detailed and final reports for all details, in brief -    Dg Chest 2 View  Result Date: 07/25/2018 CLINICAL DATA:  Central chest pain with radiation starting about 1 hour ago. Hypertension. EXAM: CHEST - 2 VIEW COMPARISON:  02/15/2018 FINDINGS: Heart size and pulmonary vascularity are normal. Emphysematous changes in the lungs. Scattered fibrosis and peribronchial thickening consistent with chronic bronchitis. No airspace disease or consolidation. No blunting of costophrenic angles. No pneumothorax. Mediastinal contours appear intact. Calcified and tortuous aorta. IMPRESSION: Emphysematous and chronic bronchitic changes in the lungs. No evidence of active pulmonary disease. Electronically Signed   By: Burman Nieves M.D.   On: 07/25/2018 02:16    Micro Results    No results found for this or any previous visit (from the past 240 hour(s)).     Today   Subjective:   Adonis Ryther today has no headache,no chest abdominal pain,no new weakness tingling or numbness, feels much better wants to go home today. *  Objective:   Blood pressure 109/69, pulse 70, temperature (!) 97.5 F (36.4 C), temperature source Oral, resp. rate 13, height 5\' 4"  (1.626 m), weight 51.7 kg, SpO2 95 %.  No intake or output data in the 24 hours ending 07/25/18 1430  Exam Awake Alert, Oriented x 3, No new F.N deficits, Normal affect Symmetrical Chest wall movement, Good air movement bilaterally, CTAB RRR,No Gallops,Rubs or new Murmurs, No Parasternal Heave +ve B.Sounds, Abd Soft, Non tender, No organomegaly appriciated, No rebound -guarding or rigidity. No Cyanosis, Clubbing or edema, No new Rash or bruise  Data Review   CBC w Diff:  Lab Results  Component Value Date   WBC 3.4 (L) 07/25/2018   HGB 10.7 (L) 07/25/2018   HGB 12.3 11/03/2017   HGB 11.8 04/20/2017   HCT 34.6 (L) 07/25/2018   HCT 40.7 11/03/2017   HCT  35.8 04/20/2017   PLT 176 07/25/2018   PLT 240 11/03/2017   LYMPHOPCT 34 07/25/2018   LYMPHOPCT 28.0 04/20/2017   MONOPCT 8 07/25/2018   MONOPCT 9.7 04/20/2017   EOSPCT 0 07/25/2018   EOSPCT 0.2 04/20/2017   BASOPCT 0 07/25/2018   BASOPCT 0.2 04/20/2017    CMP:  Lab Results  Component Value Date   NA 135 07/25/2018   NA 143 02/02/2018   K 3.4 (L) 07/25/2018   CL 102 07/25/2018   CO2 25 07/25/2018   BUN 7 (L) 07/25/2018   BUN 15 02/02/2018   CREATININE 0.76 07/25/2018   CREATININE 0.89 03/20/2016   PROT 6.8 07/16/2018   PROT 6.6 02/02/2018   ALBUMIN 3.9 07/16/2018   ALBUMIN 4.2 02/02/2018   BILITOT 0.8 07/16/2018   BILITOT 0.5 02/02/2018   ALKPHOS 55 07/16/2018   AST 23 07/16/2018   ALT 14 07/16/2018  .   Total Time in preparing paper work, data evaluation and todays exam - 30 minutes  Huey Bienenstock M.D on 07/25/2018 at 2:30 PM  [facility]   Office  [phone]

## 2018-07-25 NOTE — ED Triage Notes (Addendum)
Pt bib [facility] from home d/t central CP w/o radiation that started approximately one hour ago. Pt took her BP at home at found it to be 200/120 and took a PRN medication she is not sure of the name it.  Denies any other sx of.   Pt given 324 asa and nitro x 2 w/o relief of pain.  Pt was cleaning at time pain started.

## 2018-07-25 NOTE — ED Provider Notes (Signed)
TIME SEEN: 1:38 AM  CHIEF COMPLAINT: Chest pain, hypertension  HPI: Patient is a 77 year old female with history of hypertension, hyperlipidemia, atrial fibrillation on Eliquis who presents to the emergency department with episode of central chest heaviness that started at rest around 12:30 AM tonight.  States she has had some intermittent chest heaviness all day but became concerned just prior to arrival because she checked her blood pressure and it was 200/120.  Does state that she checks her blood pressure regularly and is normally in the 130s/80s.  Denies any shortness of breath, nausea, vomiting, diaphoresis but did feel dizzy.  No aggravating factors.  Pain resolved after 2 nitroglycerin and aspirin with EMS.  States last stress test was years ago.  No history of cardiac catheterization.  No fever, cough, lower extremity swelling or pain.  ROS: See HPI Constitutional: no fever  Eyes: no drainage  ENT: no runny nose   Cardiovascular:   chest pain  Resp: no SOB  GI: no vomiting GU: no dysuria Integumentary: no rash  Allergy: no hives  Musculoskeletal: no leg swelling  Neurological: no slurred speech ROS otherwise negative  PAST MEDICAL HISTORY/PAST SURGICAL HISTORY:  Past Medical History:  Diagnosis Date  . Allergy   . GERD (gastroesophageal reflux disease)   . Heart murmur    No significant valvular lesion noted on echo.  . Hypercholesteremia   . Hypertension   . Palpitations 01/10/2009   14 day monitor- some sinus tachycardia, PVCs  . Paroxysmal atrial fibrillation (HCC) 12/27/2008    MEDICATIONS:  Prior to Admission medications   Medication Sig Start Date End Date Taking? Authorizing Provider  acetaminophen (TYLENOL) 325 MG tablet Take 650 mg by mouth daily as needed for headache (pain).    [provider]  amLODipine (NORVASC) 10 MG tablet Take 10 mg by mouth daily as needed (SBP >160).  09/14/17   [provider]  atorvastatin (LIPITOR) 20 MG tablet Take  1 tablet (20 mg total) by mouth at bedtime. 02/01/18 07/16/26  Marykay Lex, MD  cloNIDine (CATAPRES) 0.1 MG tablet May take a tablet as needed for blood pressure greater than sytolic 170. Patient taking differently: Take 0.1 mg by mouth daily as needed (SBP >170).  08/03/17   Marykay Lex, MD  ELIQUIS 5 MG TABS tablet TAKE 1 TABLET BY MOUTH TWICE DAILY Patient taking differently: Take 5 mg by mouth 2 (two) times daily.  04/20/18   Marykay Lex, MD  ENSURE (ENSURE) Take 237 mLs by mouth daily.    [provider]  flecainide (TAMBOCOR) 50 MG tablet TAKE ONE TABLET BY MOUTH TWICE DAILY Patient taking differently: Take 50 mg by mouth 2 (two) times daily.  07/09/17   Creig Hines, NP  fluticasone (FLONASE) 50 MCG/ACT nasal spray USE TWO SPRAY(S) IN EACH NOSTRIL ONCE DAILY Patient taking differently: Place 2 sprays into both nostrils daily as needed for allergies.  01/05/17   [name], MD  lisinopril (PRINIVIL,ZESTRIL) 20 MG tablet TAKE 1 TABLET BY MOUTH ONCE DAILY Patient taking differently: Take 20 mg by mouth daily.  03/30/18   Marykay Lex, MD  metoprolol succinate (TOPROL-XL) 50 MG 24 hr tablet Take 1 tablet (50 mg total) by mouth daily. Take with or immediately following a meal. 11/12/17 07/16/26  Ofilia Neas, PA-C  Multiple Vitamin (MULTIVITAMIN WITH MINERALS) TABS tablet Take 1 tablet by mouth daily with lunch. Centrum Silver    [provider]  omeprazole (PRILOSEC) 20 MG capsule  Take 1 capsule (20 mg total) by mouth 2 (two) times daily before a meal. Take before breakfast. Patient taking differently: Take 20 mg by mouth daily before breakfast.  11/12/17   Ofilia Neaslark, Michael L, PA-C  sucralfate (CARAFATE) 1 GM/10ML suspension Take 10 mLs (1 g total) by mouth 4 (four) times daily -  with meals and at bedtime. Patient taking differently: Take 1 g by mouth daily as needed (when eating gas producing foods).  01/05/18   Shade FloodGreene, Jeffrey R, MD     ALLERGIES:  Allergies  Allergen Reactions  . Antihistamines, Chlorpheniramine-Type Palpitations    Makes heart beat fast    SOCIAL HISTORY:  Social History   Tobacco Use  . Smoking status: Never Smoker  . Smokeless tobacco: Never Used  Substance Use Topics  . Alcohol use: No    Alcohol/week: 0.0 standard drinks    FAMILY HISTORY: Family History  Problem Relation Age of Onset  . Arthritis Mother        OSTEO  . Hypertension Mother   . Dementia Mother   . Heart disease Father   . Hypertension Sister   . Dementia Sister   . Cancer - Lung Brother   . Cancer Maternal Grandmother   . Heart disease Maternal Grandfather   . Cancer - Lung Brother   . Hypertension Brother   . Cancer - Prostate Brother     EXAM: BP (!) 137/94 (BP Location: Right Arm)   Pulse 69   Temp 97.6 F (36.4 C) (Oral)   Resp 13   Ht 5\' 4"  (1.626 m)   Wt 51.7 kg   SpO2 100%   BMI 19.57 kg/m  CONSTITUTIONAL: Alert and oriented and responds appropriately to questions. Well-appearing; well-nourished, elderly HEAD: Normocephalic EYES: Conjunctivae clear, pupils appear equal, EOMI ENT: normal nose; moist mucous membranes NECK: Supple, no meningismus, no nuchal rigidity, no LAD  CARD: RRR; S1 and S2 appreciated; no murmurs, no clicks, no rubs, no gallops RESP: Normal chest excursion without splinting or tachypnea; breath sounds clear and equal bilaterally; no wheezes, no rhonchi, no rales, no hypoxia or respiratory distress, speaking full sentences ABD/GI: Normal bowel sounds; non-distended; soft, non-tender, no rebound, no guarding, no peritoneal signs, no hepatosplenomegaly BACK:  The back appears normal and is non-tender to palpation, there is no CVA tenderness EXT: Normal ROM in all joints; non-tender to palpation; no edema; normal capillary refill; no cyanosis, no calf tenderness or swelling    SKIN: Normal color for age and race; warm; no rash NEURO: Moves all extremities equally PSYCH: The  patient's mood and manner are appropriate. Grooming and personal hygiene are appropriate.  MEDICAL DECISION MAKING: Patient here with chest pain.  Has multiple risk factors for ACS.  EKG shows no ischemic changes.  No infectious symptoms to suggest pneumonia.  No signs of volume overload.  Doubt PE or dissection given patient pain-free currently.  No recent provocative testing.  Anticipate admission after cardiac work-up for chest pain rule out.  ED PROGRESS: Patient's labs unremarkable.  Negative troponin.  Chest x-ray shows emphysematous and chronic bronchitic changes but no acute pulmonary abnormality.  Will discuss with medicine for admission for chest pain rule out.  She is still chest pain-free.   2:39 AM Discussed patient's case with hospitalist, Dr. Mikeal HawthorneGarba.  I have recommended admission and patient (and family if present) agree with this plan. Admitting physician will place admission orders.   I reviewed all nursing notes, vitals, pertinent previous records, EKGs, lab and urine  results, imaging (as available).    EKG Interpretation  Date/Time:  Sunday July 25 2018 01:24:07 EST Ventricular Rate:  65 PR Interval:    QRS Duration: 95 QT Interval:  418 QTC Calculation: 435 R Axis:   6 Text Interpretation:  Sinus rhythm No significant change since last tracing Confirmed by Ward, Baxter Hire [phone]) on 07/25/2018 1:57:29 AM          Ward, Layla Maw, DO 07/25/18 6045

## 2018-08-01 HISTORY — PX: CT CTA CORONARY W/CA SCORE W/CM &/OR WO/CM: HXRAD787

## 2018-08-04 ENCOUNTER — Encounter: Payer: Self-pay | Admitting: Cardiology

## 2018-08-04 ENCOUNTER — Ambulatory Visit: Payer: Medicare Other | Admitting: Cardiology

## 2018-08-04 VITALS — BP 140/90 | HR 74 | Ht 62.5 in | Wt 116.0 lb

## 2018-08-04 DIAGNOSIS — I10 Essential (primary) hypertension: Secondary | ICD-10-CM

## 2018-08-04 DIAGNOSIS — I480 Paroxysmal atrial fibrillation: Secondary | ICD-10-CM

## 2018-08-04 DIAGNOSIS — I259 Chronic ischemic heart disease, unspecified: Secondary | ICD-10-CM | POA: Diagnosis not present

## 2018-08-04 DIAGNOSIS — E7800 Pure hypercholesterolemia, unspecified: Secondary | ICD-10-CM | POA: Diagnosis not present

## 2018-08-04 NOTE — Patient Instructions (Signed)
Medication Instructions:  NOT NEEDED If you need a refill on your cardiac medications before your next appointment, please call your pharmacy.   Lab work: NOT NEEDED If you have labs (blood work) drawn today and your tests are completely normal, you will receive your results only by: Marland Kitchen. MyChart Message (if you have MyChart) OR . A paper copy in the mail If you have any lab test that is abnormal or we need to change your treatment, we will call you to review the results.  Testing/Procedures:  KEEP APPOINTMENT FOR CORONARY CTA ONCE  TEST HAS BEEN SCHEDULE  Follow-Up: [facility] you and your health needs are our priority.  As part of our continuing mission to provide you with exceptional heart care, we have created designated Provider Care Teams.  These Care Teams include your primary Cardiologist (physician) and Advanced Practice Providers (APPs -  Physician Assistants and Nurse Practitioners) who all work together to provide you with the care you need, when you need it. You will need a follow up appointment in 12 months.  Please call our office 2 months in advance to schedule this appointment.  You may see Bryan Lemmaavid Harding, MD or one of the following Advanced Practice Providers on your designated Care Team:   Theodore DemarkRhonda Barrett, PA-C . Joni ReiningKathryn Lawrence, DNP, ANP  Any Other Special Instructions Will Be Listed Below (If Applicable).   IF TEST IS ABNORMAL WILL MAKE AN APPOINTMENT TO BE SEEN BY DR HARDING.

## 2018-08-04 NOTE — Progress Notes (Signed)
PCP: Patient, No Pcp Per  Clinic Note: Chief Complaint  Patient presents with  . Hospitalization Follow-up    Chest pain admission  . Atrial Fibrillation    Has been stable    HPI: Donna Simmons is a 77 y.o. female with a PMH notable for PAF & HTN as well as PACs/PVCs who presents today for 61-month / Post-Hospital follow-up for hypertension and atrial fibrillation  Donna Simmons was last seen in June 2019.  She was doing well.  No issues.  Just noted GERD.  After short stop, was placed back on statin.  No recurrent symptoms of A. fib.  Recent Hospitalizations:   November 15: Palpitations.  -- after taking antihistamine (known adverse Rxn = but she forgot)  November 24th: ER-hospital chest pain / pressure -->   ruled out for MI.  Plan for coronary CT angiogram as an outpatien (not done yet).  Had a normal echo.  (Was seen by Dr. Ladona Ridgel in consult).  Studies Personally Reviewed - if available, images/films reviewed: From Epic Chart or Care Everywhere  2d Echo 07/25/18: Normal EF 55-60%.  No RWMA. GR 1 DD. Ao Sclerosis - no Stenosis. Mild AI. Trivial MR.   Interval History: Donna Simmons is here today for hospital follow-up and basically 10-month follow-up.  She has had 2 episodes where she went to the emergency room in November.  The first 1 related to her taking a antihistamine and this has historically caused her to have tachycardia.  Unfortunately she forgot about that and was having some allergic reaction symptoms.  She took not 1 but 2 antihistamine tablets before realizing what she had done.  She subsequently went to the emergency room for tachycardia.  That resolved spontaneously.  She then returned on the 24th for an episode of chest discomfort.  The recommendation was for outpatient CT angiogram.  She has not had any further episodes of chest discomfort.  She says that episode really if seen more consistent with that her blood pressure going really high and having some abdominal pain.   She had a lot of GERD at that time as well and thinks that may have started. Since discharge she is not had any further episodes of chest pain or pressure.  Nothing to suggest any recurrence of A. fib such as rapid irregular heartbeats palpitations --she is taken extra flecainide dose 3 times in the last 6 months, 1 of them was when she went to the emergency room..  No syncope or near syncope.  No TIA or amaurosis fugax. No bleeding issues on Eliquis. She tells me that she has had to use clonidine once only since prescribed for high blood pressure.  Otherwise for the most part, blood pressures been relatively stable.  No claudication    ROS: A comprehensive was performed. All pertinent positives and negatives noted above. Review of Systems  Constitutional: Negative for malaise/fatigue and weight loss.  HENT: Negative for nosebleeds.   Respiratory: Negative for shortness of breath.   Cardiovascular: Positive for palpitations (Per HPI).  Gastrointestinal: Positive for abdominal pain and heartburn. Negative for blood in stool and melena.       Per HPI  Genitourinary: Negative for hematuria.  Musculoskeletal: Negative for falls, joint pain and myalgias.  Neurological: Positive for dizziness (Occasional orthostatic). Negative for headaches.  Endo/Heme/Allergies: Does not bruise/bleed easily.  Psychiatric/Behavioral: The patient is not nervous/anxious.   All other systems reviewed and are negative.   I have reviewed and (if needed) personally updated  the patient's problem list, medications, allergies, past medical and surgical history, social and family history.   Past Medical History:  Diagnosis Date  . Allergy   . GERD (gastroesophageal reflux disease)   . Heart murmur    No significant valvular lesion noted on echo.  . Hypercholesteremia   . Hypertension   . Palpitations 01/10/2009   14 day monitor- some sinus tachycardia, PVCs  . Paroxysmal atrial fibrillation (HCC) 12/27/2008     Past Surgical History:  Procedure Laterality Date  . ABDOMINAL HYSTERECTOMY  1980  . BREAST SURGERY    . CARDIAC CATHETERIZATION  12/16/2010   no evidence of CAD to explain anginal pain w/ positive troponin.  potential etiology is breakthrough AF  . EYE SURGERY Right 12/29/2016  . EYE SURGERY Left 01/19/2017  . FRACTURE SURGERY    . NM MYOVIEW LTD  April 2010   EF 64%, normal pattern of perfusion in all regions, no scintigraphic evidence of inducible ischemia; no significant wall motion abnormalities; EKG negative for ischemia; no significant change from last study; low risk scan  . TRANSTHORACIC ECHOCARDIOGRAM  07/2018   Normal EF 55-60%.  No RWMA. GR 1 DD. Ao Sclerosis - no Stenosis. Mild AI. Trivial MR.     Current Meds  Medication Sig  . acetaminophen (TYLENOL) 325 MG tablet Take 650 mg by mouth daily as needed for headache (pain).  Marland Kitchen amLODipine (NORVASC) 10 MG tablet Take 10 mg by mouth daily.   Marland Kitchen atorvastatin (LIPITOR) 20 MG tablet Take 1 tablet (20 mg total) by mouth at bedtime.  . cloNIDine (CATAPRES) 0.1 MG tablet May take a tablet as needed for blood pressure greater than sytolic 170. (Patient taking differently: Take 0.1 mg by mouth daily as needed (SBP >170). )  . ENSURE (ENSURE) Take 237 mLs by mouth daily.  . flecainide (TAMBOCOR) 50 MG tablet TAKE ONE TABLET BY MOUTH TWICE DAILY (Patient taking differently: Take 50 mg by mouth 2 (two) times daily. )  . fluticasone (FLONASE) 50 MCG/ACT nasal spray USE TWO SPRAY(S) IN EACH NOSTRIL ONCE DAILY (Patient taking differently: Place 2 sprays into both nostrils daily as needed for allergies. )  . lisinopril (PRINIVIL,ZESTRIL) 20 MG tablet TAKE 1 TABLET BY MOUTH ONCE DAILY (Patient taking differently: Take 20 mg by mouth daily. )  . metoprolol succinate (TOPROL-XL) 50 MG 24 hr tablet Take 1 tablet (50 mg total) by mouth daily. Take with or immediately following a meal.  . Multiple Vitamin (MULTIVITAMIN WITH MINERALS) TABS tablet  Take 1 tablet by mouth daily with lunch. Centrum Silver  . omeprazole (PRILOSEC) 20 MG capsule Take 1 capsule (20 mg total) by mouth 2 (two) times daily before a meal. Take before breakfast. (Patient taking differently: Take 20 mg by mouth daily before breakfast. )  . sucralfate (CARAFATE) 1 GM/10ML suspension Take 10 mLs (1 g total) by mouth 4 (four) times daily -  with meals and at bedtime. (Patient taking differently: Take 1 g by mouth daily as needed (when eating gas producing foods). )  . [DISCONTINUED] ELIQUIS 5 MG TABS tablet TAKE 1 TABLET BY MOUTH TWICE DAILY (Patient taking differently: Take 5 mg by mouth 2 (two) times daily. )    Allergies  Allergen Reactions  . Antihistamines, Chlorpheniramine-Type Palpitations    Makes heart beat fast    Social History   Tobacco Use  . Smoking status: Never Smoker  . Smokeless tobacco: Never Used  Substance Use Topics  . Alcohol use: No  Alcohol/week: 0.0 standard drinks  . Drug use: No   Social History   Social History Narrative   Married with no children. Walks several times a week at least 3-4 times a week about 2 miles a time. Does not smoke and does not drink.    Family History   family history includes Arthritis in her mother; Cancer in her maternal grandmother; Cancer - Lung in her brother and brother; Cancer - Prostate in her brother; Dementia in her mother and sister; Heart disease in her father and maternal grandfather; Hypertension in her brother, mother, and sister.  Wt Readings from Last 3 Encounters:  08/04/18 116 lb (52.6 kg)  07/25/18 114 lb (51.7 kg)  07/16/18 116 lb 6.5 oz (52.8 kg)    PHYSICAL EXAM BP 140/90 (BP Location: Left Arm, Patient Position: Sitting, Cuff Size: Normal)   Pulse 74   Ht 5' 2.5" (1.588 m)   Wt 116 lb (52.6 kg)   BMI 20.88 kg/m   Physical Exam  Constitutional: She is oriented to person, place, and time. She appears well-developed and well-nourished. No distress.  Well-groomed.  Thin,  but not frail.  Well appearing  HENT:  Head: Normocephalic and atraumatic.  Neck: No hepatojugular reflux and no JVD present. Carotid bruit is not present.  Cardiovascular: Normal rate, regular rhythm, normal heart sounds and intact distal pulses. Exam reveals no gallop and no friction rub.  No murmur heard. Nondisplaced PMI  Pulmonary/Chest: Effort normal and breath sounds normal. No respiratory distress. She has no wheezes. She has no rales. She exhibits no tenderness.  Abdominal: Soft. Bowel sounds are normal. She exhibits no distension. There is no tenderness. There is no rebound.  Musculoskeletal: Normal range of motion. She exhibits no edema.  Neurological: She is alert and oriented to person, place, and time.  Psychiatric: She has a normal mood and affect. Her behavior is normal. Judgment and thought content normal.  Vitals reviewed.   Adult ECG Report Normal sinus rhythm, rate 74 bpm.  PAC noted.  nonspecific ST and T wave changes.Otherwise normal.  Other studies Reviewed: Additional studies/ records that were reviewed today include:  Recent Labs:  n/a   June 2019: Total cholesterol 231, TG 76, HDL 78, LDL 138.  A1c 5.1.  BUN/Cr 7/0.76.  TSH 2.35.   ASSESSMENT / PLAN: Problem List Items Addressed This Visit    Chest pain - Primary    She denies any chest discomfort associated with high blood pressure.  She went to the emergency room and ruled out for MI.  I think it is probably not cardiac based on her history, but not unreasonable to proceed with the planned coronary CT angiogram.  If abnormal, I will see her back sooner, if not, I will see him back in a year.      Relevant Orders   EKG 12-Lead (Completed)   Essential hypertension (Chronic)    Usually pretty well controlled at home.  Has had some labile features.  She now has PRN clonidine to take and only used it once.  She is on amlodipine high-dose as well as lisinopril and Toprol.      Hypercholesteremia (Chronic)     LDL still way up.  Is now back on statin at 20 mg.  She is not had lipids checked since being back on the statin so we will see what looks like in follow-up visits.  May require higher dose.      Paroxysmal atrial fibrillation (HCC) - CHA2DS2-VASc Score  3, on Eliquis (Chronic)    Has been pretty stable.   Currently has maintained sinus rhythm with flecainide and metoprolol.She has a plan for breakthrough episodes --taking additional 100 mg flecainide plus metoprolol.  She is on that since I saw her last.  I think the tachycardia episode she had when she went to emergency room was probably related to her adverse reaction to antihistamine.  She remains on Eliquis.  No bleeding issues.      Relevant Orders   EKG 12-Lead (Completed)      Current medicines are reviewed at length with the patient today. (+/- concerns) n/a The following changes have been made: -- no change.  Patient Instructions  Medication Instructions:  NOT NEEDED If you need a refill on your cardiac medications before your next appointment, please call your pharmacy.   Lab work: NOT NEEDED If you have labs (blood work) drawn today and your tests are completely normal, you will receive your results only by: Marland Kitchen. MyChart Message (if you have MyChart) OR . A paper copy in the mail If you have any lab test that is abnormal or we need to change your treatment, we will call you to review the results.  Testing/Procedures:  KEEP APPOINTMENT FOR CORONARY CTA ONCE  TEST HAS BEEN SCHEDULE  Follow-Up: At [facility], you and your health needs are our priority.  As part of our continuing mission to provide you with exceptional heart care, we have created designated Provider Care Teams.  These Care Teams include your primary Cardiologist (physician) and Advanced Practice Providers (APPs -  Physician Assistants and Nurse Practitioners) who all work together to provide you with the care you need, when you need it. You will need a  follow up appointment in 12 months.  Please call our office 2 months in advance to schedule this appointment.  You may see Bryan Lemmaavid Taijah Macrae, MD or one of the following Advanced Practice Providers on your designated Care Team:   Theodore DemarkRhonda Barrett, PA-C . Joni ReiningKathryn Lawrence, DNP, ANP  Any Other Special Instructions Will Be Listed Below (If Applicable).   IF TEST IS ABNORMAL WILL MAKE AN APPOINTMENT TO BE SEEN BY DR Glorian Mcdonell.     Studies Ordered:   Orders Placed This Encounter  Procedures  . EKG 12-Lead      Bryan Lemmaavid Scharlene Catalina, M.D., M.S. Interventional Cardiologist   Pager # [phone] Phone # [phone] [address] [place], [place] [id number]

## 2018-08-05 ENCOUNTER — Other Ambulatory Visit: Payer: Self-pay | Admitting: Cardiology

## 2018-08-05 ENCOUNTER — Encounter: Payer: Self-pay | Admitting: Cardiology

## 2018-08-05 NOTE — Assessment & Plan Note (Signed)
Has been pretty stable.   Currently has maintained sinus rhythm with flecainide and metoprolol.She has a plan for breakthrough episodes --taking additional 100 mg flecainide plus metoprolol.  She is on that since I saw her last.  I think the tachycardia episode she had when she went to emergency room was probably related to her adverse reaction to antihistamine.  She remains on Eliquis.  No bleeding issues.

## 2018-08-05 NOTE — Assessment & Plan Note (Signed)
She denies any chest discomfort associated with high blood pressure.  She went to the emergency room and ruled out for MI.  I think it is probably not cardiac based on her history, but not unreasonable to proceed with the planned coronary CT angiogram.  If abnormal, I will see her back sooner, if not, I will see him back in a year.

## 2018-08-05 NOTE — Assessment & Plan Note (Signed)
LDL still way up.  Is now back on statin at 20 mg.  She is not had lipids checked since being back on the statin so we will see what looks like in follow-up visits.  May require higher dose.

## 2018-08-05 NOTE — Assessment & Plan Note (Signed)
Usually pretty well controlled at home.  Has had some labile features.  She now has PRN clonidine to take and only used it once.  She is on amlodipine high-dose as well as lisinopril and Toprol.

## 2018-08-10 ENCOUNTER — Other Ambulatory Visit: Payer: Self-pay

## 2018-08-10 MED ORDER — FLECAINIDE ACETATE 50 MG PO TABS: 50.0000 mg | ORAL_TABLET | Freq: Two times a day (BID) | ORAL | 3 refills | Status: DC

## 2018-08-11 ENCOUNTER — Other Ambulatory Visit: Payer: Self-pay | Admitting: *Deleted

## 2018-08-11 DIAGNOSIS — I259 Chronic ischemic heart disease, unspecified: Secondary | ICD-10-CM

## 2018-08-12 ENCOUNTER — Telehealth: Payer: Self-pay | Admitting: *Deleted

## 2018-08-12 NOTE — Telephone Encounter (Signed)
RECEIVED INFORMATION FROM SCHEDULER - PATIENT SCHEDULE FOR CCTA ON DEC 24 ,2019.  PATIENT NEEDS INSTRUCTION GIVEN .   RN CALLED PATIENT WENT OVER INSTRUCTION IN DETAILED . NO LABS OR MEDICATION NEEDED TO BE ORDER DUE TO PATIENT ALREADY TAKING TOPROL XL 50 MG DAILY , AND LABS WERE DONE 07/25/18.   PATIENT VERBALIZED UNDERSTANDING INSTRUCTIONS AND WRITTEN INSTRUCTION MAILED TO PATIENT.

## 2018-08-23 ENCOUNTER — Telehealth (HOSPITAL_COMMUNITY): Payer: Self-pay | Admitting: Emergency Medicine

## 2018-08-23 NOTE — Telephone Encounter (Signed)
Reaching out to patient to offer assistance regarding upcoming cardiac imaging study; phone number no longer in service [facility] Earlie Arciga RN Navigator Cardiac Imaging [phone]

## 2018-08-24 ENCOUNTER — Ambulatory Visit (HOSPITAL_COMMUNITY)
Admission: RE | Admit: 2018-08-24 | Discharge: 2018-08-24 | Disposition: A | Discharge status: Home / Self Care | Payer: Medicare Other | Source: Ambulatory Visit | Attending: Cardiology | Admitting: Cardiology

## 2018-08-24 ENCOUNTER — Ambulatory Visit (HOSPITAL_COMMUNITY): Admission: RE | Admit: 2018-08-24 | Payer: Medicare Other | Source: Ambulatory Visit

## 2018-08-24 DIAGNOSIS — I259 Chronic ischemic heart disease, unspecified: Secondary | ICD-10-CM | POA: Diagnosis not present

## 2018-08-24 MED ORDER — IOPAMIDOL (ISOVUE-370) INJECTION 76%
100.0000 mL | Freq: Once | INTRAVENOUS | Status: AC | PRN
  Administered 2018-08-24: 100 mL via INTRAVENOUS

## 2018-08-24 MED ORDER — METOPROLOL TARTRATE 5 MG/5ML IV SOLN
5.0000 mg | INTRAVENOUS | Status: DC | PRN
  Administered 2018-08-24: 5 mg via INTRAVENOUS
  Filled 2018-08-24: qty 5

## 2018-08-24 MED ORDER — NITROGLYCERIN 0.4 MG SL SUBL
SUBLINGUAL_TABLET | SUBLINGUAL | Status: AC
  Filled 2018-08-24: qty 2

## 2018-08-24 MED ORDER — NITROGLYCERIN 0.4 MG SL SUBL
0.8000 mg | SUBLINGUAL_TABLET | SUBLINGUAL | Status: DC | PRN
  Administered 2018-08-24: 0.8 mg via SUBLINGUAL
  Filled 2018-08-24 (×2): qty 25

## 2018-08-24 MED ORDER — METOPROLOL TARTRATE 5 MG/5ML IV SOLN
INTRAVENOUS | Status: AC
  Administered 2018-08-24: 5 mg
  Filled 2018-08-24: qty 20

## 2018-09-17 ENCOUNTER — Ambulatory Visit: Payer: Medicare Other | Admitting: Cardiology

## 2018-09-17 DIAGNOSIS — I259 Chronic ischemic heart disease, unspecified: Secondary | ICD-10-CM | POA: Diagnosis not present

## 2018-09-17 DIAGNOSIS — I10 Essential (primary) hypertension: Secondary | ICD-10-CM

## 2018-09-17 DIAGNOSIS — I480 Paroxysmal atrial fibrillation: Secondary | ICD-10-CM

## 2018-09-17 NOTE — Patient Instructions (Signed)
Medication Instructions:  Not needed If you need a refill on your cardiac medications before your next appointment, please call your pharmacy.   Lab work: NOT NEEDED If you have labs (blood work) drawn today and your tests are completely normal, you will receive your results only by: Marland Kitchen MyChart Message (if you have MyChart) OR . A paper copy in the mail If you have any lab test that is abnormal or we need to change your treatment, we will call you to review the results.  Testing/Procedures: NOT NEEDED  Follow-Up: At [facility], you and your health needs are our priority.  As part of our continuing mission to provide you with exceptional heart care, we have created designated Provider Care Teams.  These Care Teams include your primary Cardiologist (physician) and Advanced Practice Providers (APPs -  Physician Assistants and Nurse Practitioners) who all work together to provide you with the care you need, when you need it. You will need a follow up appointment in 6 months July 2020.  Please call our office 2 months in advance to schedule this appointment.  You may see Bryan Lemma, MD or one of the following Advanced Practice Providers on your designated Care Team:   Theodore Demark, PA-C . Joni Reining, DNP, ANP  Any Other Special Instructions Will Be Listed Below (If Applicable).

## 2018-09-17 NOTE — Progress Notes (Signed)
..   PCP: Patient, No Pcp Per  Clinic Note: No chief complaint on file.   HPI: Donna Simmons is a 78 y.o. female with a PMH notable for PAF & HTN as well as PACs/PVCs who presents today for 63-month / Post-Hospital follow-up for hypertension and atrial fibrillation  Donna Simmons was last seen in June 2019.  She was doing well.  No issues.  Just noted GERD.  After short stop, was placed back on statin.  No recurrent symptoms of A. fib.  Recent Hospitalizations:   November 15: Palpitations.  -- after taking antihistamine (known adverse Rxn = but she forgot)  November 24th: ER-hospital chest pain / pressure -->   ruled out for MI.  Plan for coronary CT angiogram as an outpatien (not done yet).  Had a normal echo.  (Was seen by Dr. Ladona Ridgel in consult).  Studies Personally Reviewed - if available, images/films reviewed: From Epic Chart or Care Everywhere  2d Echo 07/25/18: Normal EF 55-60%.  No RWMA. GR 1 DD. Ao Sclerosis - no Stenosis. Mild AI. Trivial MR.   Cor CTA -- Coronary Calcium Score 24 (LOW RISK).  Non-obstructive CAD (<30%) in pLAD & Cx.  Interval History: Donna Simmons is here today to discuss the Echo & Cor CTA results.  She is doing well & is happy to hear the results.  She has not had any further chest discomfort that lead to her last admission -- she now believes that it was related to GERD.  No further CP or dyspnea with rest or exertion.  No PND, orthopnea or notable edema (may have som e mild edema @ end of the day).   No sensation of recurrent A Fib.  Her blood pressure is usually pretty well controlled at home but is little up today because she was fussing with her husband before driving into the clinic.  She is otherwise very stable.  No syncope/near syncope or TIA/amaurosis fugax.  She has started using the amlodipine more as a as needed medicine that she takes maybe once or twice in the last couple months.  Has not required any clonidine.  She is merely on the lisinopril and  metoprolol succinate. No adverse effects of Tambocor and does not take any additional doses.  She does note a few occasional palpitations but nothing significant.   A comprehensive was performed. All pertinent positives and negatives noted above. Review of Systems  Constitutional: Negative for malaise/fatigue and weight loss.  HENT: Negative for nosebleeds.   Respiratory: Negative for cough and shortness of breath.   Cardiovascular: Positive for palpitations (Per HPI).  Gastrointestinal: Positive for abdominal pain and heartburn. Negative for blood in stool and melena.       Per HPI  Genitourinary: Negative for hematuria.  Musculoskeletal: Negative for falls, joint pain and myalgias.  Neurological: Positive for dizziness (Occasional orthostatic). Negative for speech change, focal weakness and headaches.  Endo/Heme/Allergies: Does not bruise/bleed easily.  Psychiatric/Behavioral: Negative for memory loss. The patient is not nervous/anxious and does not have insomnia.   All other systems reviewed and are negative.   I have reviewed and (if needed) personally updated the patient's problem list, medications, allergies, past medical and surgical history, social and family history.   Past Medical History:  Diagnosis Date  . Allergy   . GERD (gastroesophageal reflux disease)   . Heart murmur    No significant valvular lesion noted on echo.  . Hypercholesteremia   . Hypertension   . Palpitations 01/10/2009  14 day monitor- some sinus tachycardia, PVCs  . Paroxysmal atrial fibrillation (HCC) 12/27/2008    Past Surgical History:  Procedure Laterality Date  . ABDOMINAL HYSTERECTOMY  1980  . BREAST SURGERY    . CARDIAC CATHETERIZATION  12/16/2010   no evidence of CAD to explain anginal pain w/ positive troponin.  potential etiology is breakthrough AF  . EYE SURGERY Right 12/29/2016  . EYE SURGERY Left 01/19/2017  . FRACTURE SURGERY    . NM MYOVIEW LTD  April 2010   EF 64%, normal  pattern of perfusion in all regions, no scintigraphic evidence of inducible ischemia; no significant wall motion abnormalities; EKG negative for ischemia; no significant change from last study; low risk scan  . TRANSTHORACIC ECHOCARDIOGRAM  07/2018   Normal EF 55-60%.  No RWMA. GR 1 DD. Ao Sclerosis - no Stenosis. Mild AI. Trivial MR.     Current Meds  Medication Sig  . acetaminophen (TYLENOL) 325 MG tablet Take 650 mg by mouth daily as needed for headache (pain).  Marland Kitchen. amLODipine (NORVASC) 10 MG tablet Take 10 mg by mouth daily.   Marland Kitchen. atorvastatin (LIPITOR) 20 MG tablet Take 1 tablet (20 mg total) by mouth at bedtime.  . cloNIDine (CATAPRES) 0.1 MG tablet May take a tablet as needed for blood pressure greater than sytolic 170. (Patient taking differently: Take 0.1 mg by mouth daily as needed (SBP >170). )  . ELIQUIS 5 MG TABS tablet TAKE 1 TABLET BY MOUTH TWICE DAILY  . ENSURE (ENSURE) Take 237 mLs by mouth daily.  . flecainide (TAMBOCOR) 50 MG tablet Take 1 tablet (50 mg total) by mouth 2 (two) times daily.  . fluticasone (FLONASE) 50 MCG/ACT nasal spray USE TWO SPRAY(S) IN EACH NOSTRIL ONCE DAILY (Patient taking differently: Place 2 sprays into both nostrils daily as needed for allergies. )  . lisinopril (PRINIVIL,ZESTRIL) 20 MG tablet TAKE 1 TABLET BY MOUTH ONCE DAILY (Patient taking differently: Take 20 mg by mouth daily. )  . metoprolol succinate (TOPROL-XL) 50 MG 24 hr tablet Take 1 tablet (50 mg total) by mouth daily. Take with or immediately following a meal.  . Multiple Vitamin (MULTIVITAMIN WITH MINERALS) TABS tablet Take 1 tablet by mouth daily with lunch. Centrum Silver  . omeprazole (PRILOSEC) 20 MG capsule Take 1 capsule (20 mg total) by mouth 2 (two) times daily before a meal. Take before breakfast. (Patient taking differently: Take 20 mg by mouth daily before breakfast. )  . sucralfate (CARAFATE) 1 GM/10ML suspension Take 10 mLs (1 g total) by mouth 4 (four) times daily -  with meals  and at bedtime. (Patient taking differently: Take 1 g by mouth daily as needed (when eating gas producing foods). )    Allergies  Allergen Reactions  . Antihistamines, Chlorpheniramine-Type Palpitations    Makes heart beat fast    Social History   Tobacco Use  . Smoking status: Never Smoker  . Smokeless tobacco: Never Used  Substance Use Topics  . Alcohol use: No    Alcohol/week: 0.0 standard drinks  . Drug use: No   Social History   Social History Narrative   Married with no children. Walks several times a week at least 3-4 times a week about 2 miles a time. Does not smoke and does not drink.    Family History   family history includes Arthritis in her mother; Cancer in her maternal grandmother; Cancer - Lung in her brother and brother; Cancer - Prostate in her brother; Dementia in her  mother and sister; Heart disease in her father and maternal grandfather; Hypertension in her brother, mother, and sister.  Wt Readings from Last 3 Encounters:  09/17/18 112 lb 12.8 oz (51.2 kg)  08/04/18 116 lb (52.6 kg)  07/25/18 114 lb (51.7 kg)    PHYSICAL EXAM BP (!) 155/80   Pulse 64   Ht 5\' 4"  (1.626 m)   Wt 112 lb 12.8 oz (51.2 kg)   BMI 19.36 kg/m   Physical Exam  Constitutional: She is oriented to person, place, and time. She appears well-developed and well-nourished. No distress.  Well-groomed.  Thin, but not frail.  Well appearing  HENT:  Head: Normocephalic and atraumatic.  Neck: Normal range of motion. Neck supple. No hepatojugular reflux and no JVD present. Carotid bruit is not present.  Cardiovascular: Normal rate, regular rhythm, normal heart sounds and intact distal pulses. PMI is not displaced. Exam reveals no gallop and no friction rub.  No murmur heard. Nondisplaced PMI  Pulmonary/Chest: Effort normal and breath sounds normal. No respiratory distress. She has no wheezes. She exhibits no tenderness.  Musculoskeletal: Normal range of motion.        General: No  edema.  Neurological: She is alert and oriented to person, place, and time.  Psychiatric: She has a normal mood and affect. Her behavior is normal. Judgment and thought content normal.  Vitals reviewed.   Adult ECG Report Not checked  Other studies Reviewed: Additional studies/ records that were reviewed today include:  Recent Labs:  n/a   June 2019: Total cholesterol 231, TG 76, HDL 78, LDL 138.  A1c 5.1.  BUN/Cr 7/0.76.  TSH 2.35.  Some new labs from November: Creatinine 0.76.  Potassium 3.4.  Hemoglobin 10.7.   ASSESSMENT / PLAN: Problem List Items Addressed This Visit    Chest pain    Pretty much her atrial fibrillation recurrences lead to a sensation of chest pain she is now had heart catheterization and several stress test which argue against any presence of ischemia.  She tends to think and I tend to agree that the most recent symptoms are probably GERD related.      Essential hypertension (Chronic)    Blood pressure is little high today, but she has not yet taken blood pressure medications today.  She says at home her blood pressures are usually much more controlled in the 120 to 130 mmHg range.  She is now using the amlodipine as the first line PRN for moderately elevated pressures, and then clonidine for pressures over 160-170.      Paroxysmal atrial fibrillation (HCC) - CHA2DS2-VASc Score 3, on Eliquis (Chronic)    Stable, likely maintaining sinus rhythm with metoprolol and flecainide.  Has not had to do any breakthrough flecainide. No bleeding on Eliquis.  Otherwise doing well.  Okay to follow-up in 6 months.           Current medicines are reviewed at length with the patient today. (+/- concerns) n/a The following changes have been made: -- no change.  Patient Instructions  Medication Instructions:  Not needed If you need a refill on your cardiac medications before your next appointment, please call your pharmacy.   Lab work: NOT NEEDED If you have  labs (blood work) drawn today and your tests are completely normal, you will receive your results only by: Marland Kitchen. MyChart Message (if you have MyChart) OR . A paper copy in the mail If you have any lab test that is abnormal or we need to  change your treatment, we will call you to review the results.  Testing/Procedures: NOT NEEDED  Follow-Up: At [facility], you and your health needs are our priority.  As part of our continuing mission to provide you with exceptional heart care, we have created designated Provider Care Teams.  These Care Teams include your primary Cardiologist (physician) and Advanced Practice Providers (APPs -  Physician Assistants and Nurse Practitioners) who all work together to provide you with the care you need, when you need it. You will need a follow up appointment in 6 months July 2020.  Please call our office 2 months in advance to schedule this appointment.  You may see Bryan Lemma, MD or one of the following Advanced Practice Providers on your designated Care Team:   Theodore Demark, PA-C . Joni Reining, DNP, ANP  Any Other Special Instructions Will Be Listed Below (If Applicable).     Studies Ordered:   No orders of the defined types were placed in this encounter.     Bryan Lemma, M.D., M.S. Interventional Cardiologist   Pager # [phone] Phone # [phone] [address] [place], [place] [address]

## 2018-09-19 ENCOUNTER — Encounter: Payer: Self-pay | Admitting: Cardiology

## 2018-09-19 NOTE — Assessment & Plan Note (Signed)
Blood pressure is little high today, but she has not yet taken blood pressure medications today.  She says at home her blood pressures are usually much more controlled in the 120 to 130 mmHg range.  She is now using the amlodipine as the first line PRN for moderately elevated pressures, and then clonidine for pressures over 160-170.

## 2018-09-19 NOTE — Assessment & Plan Note (Signed)
Stable, likely maintaining sinus rhythm with metoprolol and flecainide.  Has not had to do any breakthrough flecainide. No bleeding on Eliquis.  Otherwise doing well.  Okay to follow-up in 6 months.

## 2018-09-19 NOTE — Assessment & Plan Note (Signed)
Pretty much her atrial fibrillation recurrences lead to a sensation of chest pain she is now had heart catheterization and several stress test which argue against any presence of ischemia.  She tends to think and I tend to agree that the most recent symptoms are probably GERD related.

## 2018-10-27 ENCOUNTER — Emergency Department (HOSPITAL_COMMUNITY): Payer: Medicare Other

## 2018-10-27 ENCOUNTER — Emergency Department (HOSPITAL_COMMUNITY)
Admission: EM | Admit: 2018-10-27 | Discharge: 2018-10-28 | Disposition: A | Discharge status: Home / Self Care | Payer: Medicare Other | Attending: Emergency Medicine | Admitting: Emergency Medicine

## 2018-10-27 ENCOUNTER — Encounter (HOSPITAL_COMMUNITY): Payer: Self-pay | Admitting: Emergency Medicine

## 2018-10-27 ENCOUNTER — Other Ambulatory Visit: Payer: Self-pay

## 2018-10-27 DIAGNOSIS — Z7901 Long term (current) use of anticoagulants: Secondary | ICD-10-CM | POA: Diagnosis not present

## 2018-10-27 DIAGNOSIS — Z79899 Other long term (current) drug therapy: Secondary | ICD-10-CM | POA: Diagnosis not present

## 2018-10-27 DIAGNOSIS — R002 Palpitations: Secondary | ICD-10-CM | POA: Insufficient documentation

## 2018-10-27 DIAGNOSIS — I10 Essential (primary) hypertension: Secondary | ICD-10-CM | POA: Insufficient documentation

## 2018-10-27 NOTE — ED Triage Notes (Signed)
Pt arrived via EMS from home. Pt states she felt her heart rate go into afib (last happened 6 months ago) pt had elevated BP at home 190 systolic and took clonidine. Once EMS arrived pt states she felt much better and back to normal. NSR with EMS. Pt has no chest pain, shortness of breath, N/V.

## 2018-10-27 NOTE — ED Notes (Signed)
ED Provider at bedside. 

## 2018-10-27 NOTE — ED Provider Notes (Signed)
[facility]   CSN: [id number] Arrival date & time: 10/27/18  2308    History   Chief Complaint Chief Complaint  Patient presents with  . Atrial Fibrillation    HPI Donna Simmons is a 78 y.o. female.     Patient presents by EMS with episode of lightheadedness and palpitations.  States she went to check her blood pressure tonight as she normally does and it was 190 systolic.  For several minutes before this she felt like she was in atrial fibrillation because she had a racing heart and palpitations felt that her rhythm was irregular.  This lasted about 10 minutes.  She took clonidine as needed which she takes for elevated blood pressure and then felt better.  She was sinus rhythm on EMS arrival.  Blood pressure has improved.  No chest pain, nausea, vomiting, shortness of breath, difficulty speaking, difficulty swallowing.  No focal weakness, numbness or tingling.  Patient states last episode of atrial fibrillation was about 6 months ago.  She is compliant with her Eliquis.  She also takes lisinopril, metoprolol, amlodipine for blood pressure.  She only takes clonidine for blood pressure greater than 170.  The history is provided by the patient and the EMS personnel.  Atrial Fibrillation  Pertinent negatives include no chest pain, no abdominal pain, no headaches and no shortness of breath.    Past Medical History:  Diagnosis Date  . Allergy   . GERD (gastroesophageal reflux disease)   . Heart murmur    No significant valvular lesion noted on echo.  . Hypercholesteremia   . Hypertension   . Palpitations 01/10/2009   14 day monitor- some sinus tachycardia, PVCs  . Paroxysmal atrial fibrillation (HCC) 12/27/2008    Patient Active Problem List   Diagnosis Date Noted  . Chest pain 07/25/2018  . Leg injury, left, initial encounter 04/26/2018  . Traumatic hematoma of left lower leg 04/26/2018  . Allergic dermatitis 02/02/2017  .  Dysphagia 11/05/2015  . Long term current use of anticoagulant therapy 11/18/2012  . Hypercholesteremia 09/25/2012  . DDD (degenerative disc disease), cervical 05/19/2012  . Paroxysmal atrial fibrillation (HCC) - CHA2DS2-VASc Score 3, on Eliquis 04/20/2012  . Essential hypertension 04/20/2012  . Palpitations 01/10/2009    Past Surgical History:  Procedure Laterality Date  . ABDOMINAL HYSTERECTOMY  1980  . BREAST SURGERY    . CARDIAC CATHETERIZATION  12/16/2010   no evidence of CAD to explain anginal pain w/ positive troponin.  potential etiology is breakthrough AF  . EYE SURGERY Right 12/29/2016  . EYE SURGERY Left 01/19/2017  . FRACTURE SURGERY    . NM MYOVIEW LTD  April 2010   EF 64%, normal pattern of perfusion in all regions, no scintigraphic evidence of inducible ischemia; no significant wall motion abnormalities; EKG negative for ischemia; no significant change from last study; low risk scan  . TRANSTHORACIC ECHOCARDIOGRAM  07/2018   Normal EF 55-60%.  No RWMA. GR 1 DD. Ao Sclerosis - no Stenosis. Mild AI. Trivial MR.      OB History   No obstetric history on file.      Home Medications    Prior to Admission medications   Medication Sig Start Date End Date Taking? Authorizing Provider  acetaminophen (TYLENOL) 325 MG tablet Take 650 mg by mouth daily as needed for headache (pain).    [provider]  amLODipine (NORVASC) 10 MG tablet Take 10 mg by mouth daily.  09/14/17  [provider]  atorvastatin (LIPITOR) 20 MG tablet Take 1 tablet (20 mg total) by mouth at bedtime. 02/01/18 07/16/26  Marykay Lex, MD  cloNIDine (CATAPRES) 0.1 MG tablet May take a tablet as needed for blood pressure greater than sytolic 170. Patient taking differently: Take 0.1 mg by mouth daily as needed (SBP >170).  08/03/17   Marykay Lex, MD  ELIQUIS 5 MG TABS tablet TAKE 1 TABLET BY MOUTH TWICE DAILY 08/05/18   Marykay Lex, MD  ENSURE (ENSURE) Take 237 mLs by mouth  daily.    [provider]  flecainide (TAMBOCOR) 50 MG tablet Take 1 tablet (50 mg total) by mouth 2 (two) times daily. 08/10/18   Creig Hines, NP  fluticasone (FLONASE) 50 MCG/ACT nasal spray USE TWO SPRAY(S) IN EACH NOSTRIL ONCE DAILY Patient taking differently: Place 2 sprays into both nostrils daily as needed for allergies.  01/05/17   [name], MD  lisinopril (PRINIVIL,ZESTRIL) 20 MG tablet TAKE 1 TABLET BY MOUTH ONCE DAILY Patient taking differently: Take 20 mg by mouth daily.  03/30/18   Marykay Lex, MD  metoprolol succinate (TOPROL-XL) 50 MG 24 hr tablet Take 1 tablet (50 mg total) by mouth daily. Take with or immediately following a meal. 11/12/17 07/16/26  Ofilia Neas, PA-C  Multiple Vitamin (MULTIVITAMIN WITH MINERALS) TABS tablet Take 1 tablet by mouth daily with lunch. Centrum Silver    [provider]  omeprazole (PRILOSEC) 20 MG capsule Take 1 capsule (20 mg total) by mouth 2 (two) times daily before a meal. Take before breakfast. Patient taking differently: Take 20 mg by mouth daily before breakfast.  11/12/17   Ofilia Neas, PA-C  sucralfate (CARAFATE) 1 GM/10ML suspension Take 10 mLs (1 g total) by mouth 4 (four) times daily -  with meals and at bedtime. Patient taking differently: Take 1 g by mouth daily as needed (when eating gas producing foods).  01/05/18   [name], MD    Family History Family History  Problem Relation Age of Onset  . Arthritis Mother        OSTEO  . Hypertension Mother   . Dementia Mother   . Heart disease Father   . Hypertension Sister   . Dementia Sister   . Cancer - Lung Brother   . Cancer Maternal Grandmother   . Heart disease Maternal Grandfather   . Cancer - Lung Brother   . Hypertension Brother   . Cancer - Prostate Brother     Social History Social History   Tobacco Use  . Smoking status: Never Smoker  . Smokeless tobacco: Never Used  Substance Use Topics  . Alcohol use: No     Alcohol/week: 0.0 standard drinks  . Drug use: No     Allergies   Antihistamines, chlorpheniramine-type   Review of Systems Review of Systems  Constitutional: Negative for activity change, appetite change and fever.  HENT: Negative for congestion and rhinorrhea.   Eyes: Negative for visual disturbance.  Respiratory: Negative for cough, chest tightness and shortness of breath.   Cardiovascular: Positive for palpitations. Negative for chest pain.  Gastrointestinal: Negative for abdominal pain, nausea and vomiting.  Genitourinary: Negative for dysuria and hematuria.  Musculoskeletal: Negative for arthralgias and myalgias.  Skin: Negative for rash.  Neurological: Positive for dizziness and light-headedness. Negative for weakness and headaches.   all other systems are negative except as noted in the HPI and PMH.     Physical Exam  Updated Vital Signs BP (!) 143/81 (BP Location: Right Arm)   Pulse (!) 59   Temp 97.9 F (36.6 C) (Oral)   Resp 13   SpO2 99%   Physical Exam Vitals signs and nursing note reviewed.  Constitutional:      General: She is not in acute distress.    Appearance: Normal appearance. She is well-developed and normal weight.  HENT:     Head: Normocephalic and atraumatic.     Mouth/Throat:     Pharynx: No oropharyngeal exudate.  Eyes:     Conjunctiva/sclera: Conjunctivae normal.     Pupils: Pupils are equal, round, and reactive to light.  Neck:     Musculoskeletal: Normal range of motion and neck supple.     Comments: No meningismus. Cardiovascular:     Rate and Rhythm: Normal rate and regular rhythm.     Heart sounds: Normal heart sounds. No murmur.  Pulmonary:     Effort: Pulmonary effort is normal. No respiratory distress.     Breath sounds: Normal breath sounds.  Abdominal:     Palpations: Abdomen is soft.     Tenderness: There is no abdominal tenderness. There is no guarding or rebound.  Musculoskeletal: Normal range of motion.         General: No tenderness.  Skin:    General: Skin is warm.  Neurological:     Mental Status: She is alert and oriented to person, place, and time.     Cranial Nerves: No cranial nerve deficit.     Motor: No abnormal muscle tone.     Coordination: Coordination normal.     Comments:  5/5 strength throughout. CN 2-12 intact.Equal grip strength.   Psychiatric:        Behavior: Behavior normal.      ED Treatments / Results  Labs (all labs ordered are listed, but only abnormal results are displayed) Labs Reviewed  CBC WITH DIFFERENTIAL/PLATELET - Abnormal; Notable for the following components:      Result Value   Hemoglobin 10.8 (*)    HCT 35.5 (*)    MCH 25.0 (*)    All other components within normal limits  URINALYSIS, ROUTINE W REFLEX MICROSCOPIC - Abnormal; Notable for the following components:   Color, Urine COLORLESS (*)    Specific Gravity, Urine 1.001 (*)    Hgb urine dipstick SMALL (*)    All other components within normal limits  BASIC METABOLIC PANEL  TROPONIN I    EKG EKG Interpretation  Date/Time:  Wednesday October 27 2018 23:26:09 EST Ventricular Rate:  59 PR Interval:    QRS Duration: 91 QT Interval:  424 QTC Calculation: 420 R Axis:   22 Text Interpretation:  Sinus rhythm No significant change was found Confirmed by Glynn Octave [phone]) on 10/27/2018 11:44:24 PM   Radiology Dg Chest 2 View  Result Date: 10/28/2018 CLINICAL DATA:  Atrial fibrillation, shortness of breath. EXAM: CHEST - 2 VIEW COMPARISON:  Chest radiograph July 25, 2018 FINDINGS: Cardiomediastinal silhouette is normal. Mildly calcified aortic arch. No pleural effusions or focal consolidations. Trachea projects midline and there is no pneumothorax. Soft tissue planes and included osseous structures are non-suspicious. IMPRESSION: 1. No acute cardiopulmonary process. 2.  Aortic Atherosclerosis (ICD10-I70.0). Electronically Signed   By: Awilda Metro M.D.   On: 10/28/2018 00:02    Review is a CT scan  Procedures Procedures (including critical care time)  Medications Ordered in ED Medications - No data to display   Initial Impression / Assessment  and Plan / ED Course  I have reviewed the triage vital signs and the nursing notes.  Pertinent labs & imaging results that were available during my care of the patient were reviewed by me and considered in my medical decision making (see chart for details).       Episode of palpitations and suspected atrial fibrillation at home that is since resolved.  Was hyper tensive took clonidine.  No chest pain or shortness of breath. Feels improved on arrival and back to baseline.  EKG is sinus rhythm.  Labs show stable anemia. Electrolytes normal.    Remains in sinus rhythm throughout ED stay.  No chest pain or shortness of breath.  Patient feels back to baseline without dizziness or lightheadedness. Her blood pressure has improved.  She is advised to follow-up with her primary doctor and cardiologist.  Return precautions discussed including worsening symptoms such as chest pain, shortness of breath or other concerns. Final Clinical Impressions(s) / ED Diagnoses   Final diagnoses:  Palpitations    ED Discharge Orders    None       Nykolas Bacallao, Jeannett Senior, MD 10/28/18 [phone]

## 2018-10-27 NOTE — ED Notes (Signed)
Patient transported to X-ray 

## 2018-10-28 LAB — BASIC METABOLIC PANEL
ANION GAP: 9 (ref 5–15)
BUN: 12 mg/dL (ref 8–23)
CO2: 24 mmol/L (ref 22–32)
Calcium: 9 mg/dL (ref 8.9–10.3)
Chloride: 104 mmol/L (ref 98–111)
Creatinine, Ser: 0.87 mg/dL (ref 0.44–1.00)
GFR calc non Af Amer: >60 mL/min (ref >60)
Glucose, Bld: 97 mg/dL (ref 70–99)
Potassium: 3.5 mmol/L (ref 3.5–5.1)
SODIUM: 137 mmol/L (ref 135–145)

## 2018-10-28 LAB — CBC WITH DIFFERENTIAL/PLATELET
Abs Immature Granulocytes: 0.01 10*3/uL (ref 0.00–0.07)
Basophils Absolute: 0 10*3/uL (ref 0.0–0.1)
Basophils Relative: 0 %
Eosinophils Absolute: 0 10*3/uL (ref 0.0–0.5)
Eosinophils Relative: 0 %
HCT: 35.5 % — ABNORMAL LOW (ref 36.0–46.0)
Hemoglobin: 10.8 g/dL — ABNORMAL LOW (ref 12.0–15.0)
IMMATURE GRANULOCYTES: 0 %
Lymphocytes Relative: 20 %
Lymphs Abs: 1 10*3/uL (ref 0.7–4.0)
MCH: 25 pg — ABNORMAL LOW (ref 26.0–34.0)
MCHC: 30.4 g/dL (ref 30.0–36.0)
MCV: 82.2 fL (ref 80.0–100.0)
Monocytes Absolute: 0.3 10*3/uL (ref 0.1–1.0)
Monocytes Relative: 7 %
Neutro Abs: 3.6 10*3/uL (ref 1.7–7.7)
Neutrophils Relative %: 73 %
Platelets: 205 10*3/uL (ref 150–400)
RBC: 4.32 MIL/uL (ref 3.87–5.11)
RDW: 15.1 % (ref 11.5–15.5)
WBC: 4.9 10*3/uL (ref 4.0–10.5)
nRBC: 0 % (ref 0.0–0.2)

## 2018-10-28 LAB — URINALYSIS, ROUTINE W REFLEX MICROSCOPIC
Bacteria, UA: NONE SEEN
Bilirubin Urine: NEGATIVE
Glucose, UA: NEGATIVE mg/dL
KETONES UR: NEGATIVE mg/dL
Leukocytes,Ua: NEGATIVE
NITRITE: NEGATIVE
Protein, ur: NEGATIVE mg/dL
Specific Gravity, Urine: 1.001 — ABNORMAL LOW (ref 1.005–1.030)
pH: 6 (ref 5.0–8.0)

## 2018-10-28 LAB — TROPONIN I: Troponin I: <0.03 ng/mL (ref <0.03)

## 2018-10-28 NOTE — ED Notes (Signed)
Pt returned to room from xray.

## 2018-10-28 NOTE — Discharge Instructions (Addendum)
Continue your medications as prescribed and follow-up with your cardiologist.  Return to the ED if you develop chest pain, shortness of breath or other concerns.

## 2018-10-28 NOTE — ED Notes (Signed)
Patient verbalizes understanding of discharge instructions. Opportunity for questioning and answers were provided. Armband removed by staff, pt discharged from ED in wheelchair.  

## 2018-11-14 NOTE — Progress Notes (Signed)
Cardiology Office Note   Date:  11/15/2018   ID:  Mercer, Stallworth 1941/07/19, MRN [id number]  PCP:  Georgina Quint, MD  Cardiologist: Dr. Herbie Baltimore  Chief Complaint  Patient presents with  . Follow-up  . Atrial Fibrillation     History of Present Illness: Donna Simmons is a 78 y.o. female who presents for ongoing assessment and management of PAF on Eliquis, PAC's, hypercholesterolemia and hypertension. Other history includes GERD. She was last seen by Dr. Herbie Baltimore on 09/17/2018. At that time she was doing well, Cardiac CT scan had been planned but not completed by that office visit.  This was deferred as she was asymptomatic, feeling that chest discomfort was related to GERD.  She was seen in the ER on 10/27/2018 for symptoms of light-headedness and hypertension. She felt that she was in atrial fib due to increased palpitations. She took a clonidine to help with BP control and was in NSR on arrival to the ER. She is her for follow up.   She is having some episodes of short term memory loss but her husband has been making sure she takes her medications. She has recently lost a close friend to death and has had some BP elevations with this. Otherwise she is doing well and is without recurrent racing HR.   Past Medical History:  Diagnosis Date  . Allergy   . GERD (gastroesophageal reflux disease)   . Heart murmur    No significant valvular lesion noted on echo.  . Hypercholesteremia   . Hypertension   . Palpitations 01/10/2009   14 day monitor- some sinus tachycardia, PVCs  . Paroxysmal atrial fibrillation (HCC) 12/27/2008    Past Surgical History:  Procedure Laterality Date  . ABDOMINAL HYSTERECTOMY  1980  . BREAST SURGERY    . CARDIAC CATHETERIZATION  12/16/2010   no evidence of CAD to explain anginal pain w/ positive troponin.  potential etiology is breakthrough AF  . EYE SURGERY Right 12/29/2016  . EYE SURGERY Left 01/19/2017  . FRACTURE SURGERY    . NM MYOVIEW LTD   April 2010   EF 64%, normal pattern of perfusion in all regions, no scintigraphic evidence of inducible ischemia; no significant wall motion abnormalities; EKG negative for ischemia; no significant change from last study; low risk scan  . TRANSTHORACIC ECHOCARDIOGRAM  07/2018   Normal EF 55-60%.  No RWMA. GR 1 DD. Ao Sclerosis - no Stenosis. Mild AI. Trivial MR.      Current Outpatient Medications  Medication Sig Dispense Refill  . acetaminophen (TYLENOL) 325 MG tablet Take 650 mg by mouth daily as needed for headache (pain).    Marland Kitchen amLODipine (NORVASC) 10 MG tablet Take 10 mg by mouth daily.     Marland Kitchen atorvastatin (LIPITOR) 20 MG tablet Take 1 tablet (20 mg total) by mouth at bedtime. 90 tablet 3  . cloNIDine (CATAPRES) 0.1 MG tablet May take a tablet as needed for blood pressure greater than sytolic 170. (Patient taking differently: Take 0.1 mg by mouth daily as needed (SBP >170). ) 15 tablet 11  . ELIQUIS 5 MG TABS tablet TAKE 1 TABLET BY MOUTH TWICE DAILY 90 tablet 3  . ENSURE (ENSURE) Take 237 mLs by mouth daily.    . flecainide (TAMBOCOR) 50 MG tablet Take 1 tablet (50 mg total) by mouth 2 (two) times daily. 180 tablet 3  . fluticasone (FLONASE) 50 MCG/ACT nasal spray USE TWO SPRAY(S) IN EACH NOSTRIL ONCE DAILY (Patient taking differently: Place 2  sprays into both nostrils daily as needed for allergies. ) 16 g 1  . lisinopril (PRINIVIL,ZESTRIL) 20 MG tablet TAKE 1 TABLET BY MOUTH ONCE DAILY (Patient taking differently: Take 20 mg by mouth daily. ) 90 tablet 3  . metoprolol succinate (TOPROL-XL) 50 MG 24 hr tablet Take 1 tablet (50 mg total) by mouth daily. Take with or immediately following a meal. 90 tablet 3  . Multiple Vitamin (MULTIVITAMIN WITH MINERALS) TABS tablet Take 1 tablet by mouth daily with lunch. Centrum Silver    . omeprazole (PRILOSEC) 20 MG capsule Take 1 capsule (20 mg total) by mouth 2 (two) times daily before a meal. Take before breakfast. (Patient taking differently: Take 20  mg by mouth daily before breakfast. ) 30 capsule 0  . sucralfate (CARAFATE) 1 GM/10ML suspension Take 10 mLs (1 g total) by mouth 4 (four) times daily -  with meals and at bedtime. (Patient taking differently: Take 1 g by mouth daily as needed (when eating gas producing foods). ) 420 mL 0   No current facility-administered medications for this visit.     Allergies:   Antihistamines, chlorpheniramine-type    Social History:  The patient  reports that she has never smoked. She has never used smokeless tobacco. She reports that she does not drink alcohol or use drugs.   Family History:  The patient's family history includes Arthritis in her mother; Cancer in her maternal grandmother; Cancer - Lung in her brother and brother; Cancer - Prostate in her brother; Dementia in her mother and sister; Heart disease in her father and maternal grandfather; Hypertension in her brother, mother, and sister.    ROS: All other systems are reviewed and negative. Unless otherwise mentioned in H&P    PHYSICAL EXAM: VS:  BP (!) 150/86   Pulse 75   Ht 5\' 4"  (1.626 m)   Wt 113 lb 3.2 oz (51.3 kg)   SpO2 98%   BMI 19.43 kg/m  , BMI Body mass index is 19.43 kg/m. GEN: Well nourished, well developed, in no acute distress HEENT: normal Neck: no JVD, carotid bruits, or masses Cardiac: RRR; no murmurs, rubs, or gallops,no edema  Respiratory:  Clear to auscultation bilaterally, normal work of breathing GI: soft, nontender, nondistended, + BS MS: no deformity or atrophy Skin: warm and dry, no rash Neuro:  Strength and sensation are intact Psych: euthymic mood, full affect   EKG:  Not completed this office visit.   Recent Labs: 07/16/2018: ALT 14 10/27/2018: BUN 12; Creatinine, Ser 0.87; Hemoglobin 10.8; Platelets 205; Potassium 3.5; Sodium 137    Lipid Panel    Component Value Date/Time   CHOL 231 (H) 02/02/2018 0945   TRIG 76 02/02/2018 0945   HDL 78 02/02/2018 0945   CHOLHDL 3.0 02/02/2018 0945    CHOLHDL 2.7 08/29/2015 0910   VLDL 20 08/29/2015 0910   LDLCALC 138 (H) 02/02/2018 0945      Wt Readings from Last 3 Encounters:  11/15/18 113 lb 3.2 oz (51.3 kg)  09/17/18 112 lb 12.8 oz (51.2 kg)  08/04/18 116 lb (52.6 kg)      Other studies Reviewed: Echocardiogram 08-05-18 Left ventricle: The cavity size was normal. Wall thickness was   normal. Systolic function was normal. The estimated ejection   fraction was in the range of 55% to 60%. Wall motion was normal;   there were no regional wall motion abnormalities. Doppler   parameters are consistent with abnormal left ventricular   relaxation (grade 1 diastolic  dysfunction). The E/e&' ratio is   >15, suggesting elevated LV filling pressure. - Aortic valve: Trileaflet. Sclerosis without stenosis. There was   mild regurgitation. - Mitral valve: Mildly thickened leaflets . There was trivial   regurgitation. - Left atrium: The atrium was normal in size. - Tricuspid valve: There was trivial regurgitation. - Pulmonary arteries: PA peak pressure: 19 mm Hg (S). - Inferior vena cava: The vessel was normal in size. The   respirophasic diameter changes were in the normal range (>= 50%),   consistent with normal central venous pressure.  ASSESSMENT AND PLAN:  1. PAF: She is doing well and continues on Eliquis. She is medically compliant and does not have recurrent symptoms of rapid HR.  She is given refills on metoprolol, should continue Flecainide as directed.   2. Hypertension: I have rechecked her BP in the exam room. 138/68. No changes in her medication regimen.   3. GERD: Continue medical management.    Current medicines are reviewed at length with the patient today.    Labs/ tests ordered today include: None   Bettey Mare. Liborio Nixon, ANP, AACC   11/15/2018 4:44 PM    [facility] [address] Office [phone] Fax [phone]

## 2018-11-15 ENCOUNTER — Encounter: Payer: Self-pay | Admitting: Adult Health

## 2018-11-15 ENCOUNTER — Ambulatory Visit: Payer: Medicare Other | Admitting: Adult Health

## 2018-11-15 ENCOUNTER — Other Ambulatory Visit: Payer: Self-pay

## 2018-11-15 VITALS — BP 150/86 | HR 75 | Ht 64.0 in | Wt 113.2 lb

## 2018-11-15 DIAGNOSIS — I10 Essential (primary) hypertension: Secondary | ICD-10-CM

## 2018-11-15 DIAGNOSIS — I480 Paroxysmal atrial fibrillation: Secondary | ICD-10-CM

## 2018-11-15 DIAGNOSIS — K219 Gastro-esophageal reflux disease without esophagitis: Secondary | ICD-10-CM

## 2018-11-15 MED ORDER — METOPROLOL SUCCINATE ER 50 MG PO TB24: 50.0000 mg | ORAL_TABLET | Freq: Every day | ORAL | 3 refills | Status: DC

## 2018-11-15 NOTE — Patient Instructions (Signed)
Follow-Up: You will need a follow up appointment in 6 months.  Please call our office 2 months in advance July 2020 to schedule this SEPT 2020 appointment.  You may see Bryan Lemma, MD or one of the following Advanced Practice Providers on your designated Care Team:  Theodore Demark, PA-C Joni Reining, DNP, AACC      Medication Instructions:  NO CHANGES- Your physician recommends that you continue on your current medications as directed. Please refer to the Current Medication list given to you today. If you need a refill on your cardiac medications before your next appointment, please call your pharmacy. Labwork: When you have labs (blood work) and your tests are completely normal, you will receive your results ONLY by MyChart Message (if you have MyChart) -OR- A paper copy in the mail.  At [facility], you and your health needs are our priority.  As part of our continuing mission to provide you with exceptional heart care, we have created designated Provider Care Teams.  These Care Teams include your primary Cardiologist (physician) and Advanced Practice Providers (APPs -  Physician Assistants and Nurse Practitioners) who all work together to provide you with the care you need, when you need it.  Thank you for choosing [facility]

## 2019-01-05 ENCOUNTER — Telehealth: Payer: Self-pay | Admitting: Family Medicine

## 2019-02-03 ENCOUNTER — Other Ambulatory Visit: Payer: Self-pay | Admitting: Cardiology

## 2019-02-08 NOTE — Telephone Encounter (Signed)
Called 02/07/19 still no answer

## 2019-03-23 ENCOUNTER — Encounter: Payer: Self-pay | Admitting: Family Medicine

## 2019-03-23 ENCOUNTER — Ambulatory Visit (INDEPENDENT_AMBULATORY_CARE_PROVIDER_SITE_OTHER): Payer: Medicare Other | Admitting: Family Medicine

## 2019-03-23 ENCOUNTER — Other Ambulatory Visit: Payer: Self-pay

## 2019-03-23 VITALS — BP 138/72 | HR 72 | Temp 97.9°F | Resp 17 | Ht 64.0 in | Wt 114.4 lb

## 2019-03-23 DIAGNOSIS — R300 Dysuria: Secondary | ICD-10-CM | POA: Diagnosis not present

## 2019-03-23 DIAGNOSIS — Z23 Encounter for immunization: Secondary | ICD-10-CM | POA: Diagnosis not present

## 2019-03-23 DIAGNOSIS — N952 Postmenopausal atrophic vaginitis: Secondary | ICD-10-CM

## 2019-03-23 DIAGNOSIS — R1900 Intra-abdominal and pelvic swelling, mass and lump, unspecified site: Secondary | ICD-10-CM | POA: Diagnosis not present

## 2019-03-23 LAB — POCT WET + KOH PREP
Trich by wet prep: ABSENT
Yeast by KOH: ABSENT
Yeast by wet prep: ABSENT

## 2019-03-23 NOTE — Patient Instructions (Addendum)
If you have lab work done today you will be contacted with your lab results within the next 2 weeks.  If you have not heard from [place] then please contact us. The fastest way to get your results is to register for My Chart.   IF you received an x-ray today, you will receive an invoice from [facility]. Please contact [facility] at [phone] with questions or concerns regarding your invoice.   IF you received labwork today, you will receive an invoice from [facility]. Please contact [facility] at [phone] with questions or concerns regarding your invoice.   Our billing staff will not be able to assist you with questions regarding bills from these companies.  You will be contacted with the lab results as soon as they are available. The fastest way to get your results is to activate your My Chart account. Instructions are located on the last page of this paperwork. If you have not heard from [place] regarding the results in 2 weeks, please contact this office.     Atrophic Vaginitis  Atrophic vaginitis is a condition in which the tissues that line the vagina become dry and thin. This condition is most common in women who have stopped having regular menstrual periods (are in menopause). This usually starts when a woman is 35-77 years old. That is the time when a woman's estrogen levels begin to drop (decrease). Estrogen is a female hormone. It helps to keep the tissues of the vagina moist. It stimulates the vagina to produce a clear fluid that lubricates the vagina for sexual intercourse. This fluid also protects the vagina from infection. Lack of estrogen can cause the lining of the vagina to get thinner and dryer. The vagina may also shrink in size. It may become less elastic. Atrophic vaginitis tends to get worse over time as a woman's estrogen level drops. What are the causes? This condition is caused by the normal drop in estrogen that happens around the time of  menopause. What increases the risk? Certain conditions or situations may lower a woman's estrogen level, leading to a higher risk for atrophic vaginitis. You are more likely to develop this condition if:  You are taking medicines that block estrogen.  You have had your ovaries removed.  You are being treated for cancer with X-ray (radiation) or medicines (chemotherapy).  You have given birth or are breastfeeding.  You are older than age 15.  You smoke. What are the signs or symptoms? Symptoms of this condition include:  Pain, soreness, or bleeding during sexual intercourse (dyspareunia).  Vaginal burning, irritation, or itching.  Pain or bleeding when a speculum is used in a vaginal exam (pelvic exam).  Having burning pain when passing urine.  Vaginal discharge that is brown or yellow. In some cases, there are no symptoms. How is this diagnosed? This condition is diagnosed by taking a medical history and doing a physical exam. This will include a pelvic exam that checks the vaginal tissues. Though rare, you may also have other tests, including:  A urine test.  A test that checks the acid balance in your vagina (acid balance test). How is this treated? Treatment for this condition depends on how severe your symptoms are. Treatment may include:  Using an over-the-counter vaginal lubricant before sex.  Using a long-acting vaginal moisturizer.  Using low-dose vaginal estrogen for moderate to severe symptoms that do not respond to other treatments. Options include creams, tablets, and inserts (vaginal rings). Before you use  a vaginal estrogen, tell your health care provider if you have a history of: ? Breast cancer. ? Endometrial cancer. ? Blood clots. If you are not sexually active and your symptoms are very mild, you may not need treatment. Follow these instructions at home: Medicines  Take over-the-counter and prescription medicines only as told by your health care  provider. Do not use herbal or alternative medicines unless your health care provider says that you can.  Use over-the-counter creams, lubricants, or moisturizers for dryness only as directed by your health care provider. General instructions  If your atrophic vaginitis is caused by menopause, discuss all of your menopause symptoms and treatment options with your health care provider.  Do not douche.  Do not use products that can make your vagina dry. These include: ? Scented feminine sprays. ? Scented tampons. ? Scented soaps.  Vaginal intercourse can help to improve blood flow and elasticity of vaginal tissue. If it hurts to have sex, try using a lubricant or moisturizer just before having intercourse. Contact a health care provider if:  Your discharge looks different than normal.  Your vagina has an unusual smell.  You have new symptoms.  Your symptoms do not improve with treatment.  Your symptoms get worse. Summary  Atrophic vaginitis is a condition in which the tissues that line the vagina become dry and thin. It is most common in women who have stopped having regular menstrual periods (are in menopause).  Treatment options include using vaginal lubricants and low-dose vaginal estrogen.  Contact a health care provider if your vagina has an unusual smell, or if your symptoms get worse or do not improve after treatment. This information is not intended to replace advice given to you by your health care provider. Make sure you discuss any questions you have with your health care provider. Document Released: 01/02/2015 Document Revised: 07/31/2017 Document Reviewed: 05/14/2017 Elsevier Patient Education  2020 [employer].

## 2019-03-23 NOTE — Progress Notes (Signed)
Established Patient Office Visit  Subjective:  Patient ID: Donna Simmons, female    DOB: 17-Jun-1941  Age: 78 y.o. MRN: [id number]  CC:  Chief Complaint  Patient presents with  . 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  She is from [place] her grandmother was a medicine woman. She is married and lives with her husband.  Past Medical History:  Diagnosis Date  . Allergy   . GERD (gastroesophageal reflux disease)   . Heart murmur    No significant valvular lesion noted on echo.  . Hypercholesteremia   . Hypertension   . Palpitations 01/10/2009   14 day monitor- some sinus tachycardia, PVCs  . Paroxysmal atrial fibrillation (HCC) 12/27/2008    Past Surgical History:  Procedure Laterality Date  . ABDOMINAL HYSTERECTOMY  1980  . BREAST SURGERY    . CARDIAC CATHETERIZATION  12/16/2010   no evidence of CAD to explain anginal pain w/ positive troponin.  potential etiology is breakthrough AF  . EYE SURGERY Right 12/29/2016  . EYE SURGERY Left 01/19/2017  . FRACTURE SURGERY    . NM MYOVIEW LTD  April 2010   EF 64%, normal pattern of perfusion in all regions, no scintigraphic evidence of inducible ischemia; no significant wall motion abnormalities; EKG negative for ischemia; no significant change from last study; low risk scan  . TRANSTHORACIC ECHOCARDIOGRAM  07/2018   Normal EF 55-60%.  No RWMA. GR 1 DD. Ao Sclerosis - no Stenosis. Mild AI. Trivial MR.     Family  History  Problem Relation Age of Onset  . Arthritis Mother        OSTEO  . Hypertension Mother   . Dementia Mother   . Heart disease Father   . Hypertension Sister   . Dementia Sister   . Cancer - Lung Brother   . Cancer Maternal Grandmother   . Heart disease Maternal Grandfather   . Cancer - Lung Brother   . Hypertension Brother   . Cancer - Prostate Brother     Social History   Socioeconomic History  . Marital status: Married    Spouse name: Not on file  . Number of children: Not on file  . Years of education: Not on file  . Highest education level: Not on file  Occupational History  . Not on file  Social Needs  . Financial resource strain: Not on file  . Food insecurity    Worry: Not on file    Inability: Not on file  . Transportation needs    Medical: Not on file    Non-medical: Not on file  Tobacco Use  . Smoking status: Never Smoker  . Smokeless tobacco: Never Used  Substance and Sexual Activity  . Alcohol use: No    Alcohol/week: 0.0 standard drinks  . Drug use: No  . Sexual activity: Yes  Lifestyle  . Physical activity    Days per week: Not on file  Minutes per session: Not on file  . Stress: Not on file  Relationships  . Social Musicianconnections    Talks on phone: Not on file    Gets together: Not on file    Attends religious service: Not on file    Active member of club or organization: Not on file    Attends meetings of clubs or organizations: Not on file    Relationship status: Not on file  . Intimate partner violence    Fear of current or ex partner: Not on file    Emotionally abused: Not on file    Physically abused: Not on file    Forced sexual activity: Not on file  Other Topics Concern  . Not on file  Social History Narrative   Married with no children. Walks several times a week at least 3-4 times a week about 2 miles a time. Does not smoke and does not drink.    Outpatient Medications Prior to Visit  Medication Sig Dispense Refill  .  acetaminophen (TYLENOL) 325 MG tablet Take 650 mg by mouth daily as needed for headache (pain).    Marland Kitchen. amLODipine (NORVASC) 10 MG tablet Take 10 mg by mouth daily.     Marland Kitchen. atorvastatin (LIPITOR) 20 MG tablet Take 1 tablet (20 mg total) by mouth at bedtime. 90 tablet 3  . cloNIDine (CATAPRES) 0.1 MG tablet May take a tablet as needed for blood pressure greater than sytolic 170. (Patient taking differently: Take 0.1 mg by mouth daily as needed (SBP >170). ) 15 tablet 11  . ELIQUIS 5 MG TABS tablet Take 1 tablet by mouth twice daily 180 tablet 0  . ENSURE (ENSURE) Take 237 mLs by mouth daily.    . flecainide (TAMBOCOR) 50 MG tablet Take 1 tablet (50 mg total) by mouth 2 (two) times daily. 180 tablet 3  . fluticasone (FLONASE) 50 MCG/ACT nasal spray USE TWO SPRAY(S) IN EACH NOSTRIL ONCE DAILY (Patient taking differently: Place 2 sprays into both nostrils daily as needed for allergies. ) 16 g 1  . lisinopril (PRINIVIL,ZESTRIL) 20 MG tablet TAKE 1 TABLET BY MOUTH ONCE DAILY (Patient taking differently: Take 20 mg by mouth daily. ) 90 tablet 3  . Multiple Vitamin (MULTIVITAMIN WITH MINERALS) TABS tablet Take 1 tablet by mouth daily with lunch. Centrum Silver    . omeprazole (PRILOSEC) 20 MG capsule Take 1 capsule (20 mg total) by mouth 2 (two) times daily before a meal. Take before breakfast. (Patient taking differently: Take 20 mg by mouth daily before breakfast. ) 30 capsule 0  . sucralfate (CARAFATE) 1 GM/10ML suspension Take 10 mLs (1 g total) by mouth 4 (four) times daily -  with meals and at bedtime. (Patient taking differently: Take 1 g by mouth daily as needed (when eating gas producing foods). ) 420 mL 0  . metoprolol succinate (TOPROL-XL) 50 MG 24 hr tablet Take 1 tablet (50 mg total) by mouth daily. Take with or immediately following a meal. 90 tablet 3   No facility-administered medications prior to visit.     Allergies  Allergen Reactions  . Antihistamines, Chlorpheniramine-Type Palpitations     Makes heart beat fast    ROS Review of Systems Review of Systems  Constitutional: Negative for activity change, appetite change, chills and fever.  HENT: Negative for congestion, nosebleeds, trouble swallowing and voice change.   Respiratory: Negative for cough, shortness of breath and wheezing.   Gastrointestinal: Negative for diarrhea, nausea and vomiting.  Genitourinary: Negative for  difficulty urinating, dysuria, flank pain and hematuria.  Musculoskeletal: Negative for back pain, joint swelling and neck pain.  Neurological: Negative for dizziness, speech difficulty, light-headedness and numbness.  See HPI. All other review of systems negative.     Objective:    Physical Exam  BP 138/72 (BP Location: Right Arm, Patient Position: Sitting, Cuff Size: Normal)   Pulse 72   Temp 97.9 F (36.6 C) (Oral)   Resp 17   Ht 5\' 4"  (1.626 m)   Wt 114 lb 6.4 oz (51.9 kg)   SpO2 99%   BMI 19.64 kg/m  Wt Readings from Last 3 Encounters:  03/23/19 114 lb 6.4 oz (51.9 kg)  11/15/18 113 lb 3.2 oz (51.3 kg)  09/17/18 112 lb 12.8 oz (51.2 kg)   Physical Exam  Constitutional: Oriented to person, place, and time. Appears well-developed and well-nourished.  HENT:  Head: Normocephalic and atraumatic.  Eyes: Conjunctivae and EOM are normal.  Cardiovascular: Normal rate, regular rhythm, normal heart sounds and intact distal pulses.  No murmur heard. Pulmonary/Chest: Effort normal and breath sounds normal. No stridor. No respiratory distress. Has no wheezes.  Neurological: Is alert and oriented to person, place, and time.  Skin: Skin is warm. Capillary refill takes less than 2 seconds.  Psychiatric: Has a normal mood and affect. Behavior is normal. Judgment and thought content normal.   Gynecology - Chaperone Present Breast exam - small flat breasts without axillary lymphedema Vaginal exam Labia normal bilaterally without skin lesions Urethral meatus normal appearing without erythema but is  very prominent Vagina without discharge No CMT, ovaries small and not palpable Uterus midline, nontender   Health Maintenance Due  Topic Date Due  . PNA vac Low Risk Adult (2 of 2 - PPSV23) 08/28/2016    There are no preventive care reminders to display for this patient.  Lab Results  Component Value Date   TSH 2.350 11/03/2017   Lab Results  Component Value Date   WBC 4.9 10/27/2018   HGB 10.8 (L) 10/27/2018   HCT 35.5 (L) 10/27/2018   MCV 82.2 10/27/2018   PLT 205 10/27/2018   Lab Results  Component Value Date   NA 137 10/27/2018   K 3.5 10/27/2018   CO2 24 10/27/2018   GLUCOSE 97 10/27/2018   BUN 12 10/27/2018   CREATININE 0.87 10/27/2018   BILITOT 0.8 07/16/2018   ALKPHOS 55 07/16/2018   AST 23 07/16/2018   ALT 14 07/16/2018   PROT 6.8 07/16/2018   ALBUMIN 3.9 07/16/2018   CALCIUM 9.0 10/27/2018   ANIONGAP 9 10/27/2018   Lab Results  Component Value Date   CHOL 231 (H) 02/02/2018   Lab Results  Component Value Date   HDL 78 02/02/2018   Lab Results  Component Value Date   LDLCALC 138 (H) 02/02/2018   Lab Results  Component Value Date   TRIG 76 02/02/2018   Lab Results  Component Value Date   CHOLHDL 3.0 02/02/2018   Lab Results  Component Value Date   HGBA1C 5.1 05/21/2017      Assessment & Plan:   Problem List Items Addressed This Visit    None    Visit Diagnoses    Need for prophylactic vaccination and inoculation against influenza       Relevant Orders   Pneumococcal conjugate vaccine 13-valent IM   Vaginal atrophy    -  Advised pt to use a spray bottle to wash the perineum if she feels like she is clean as  vigorous wiping can lead to irritation of the urethral meatus which is very prominent Also advised lubrication with coconut or mineral oil a few times weekly   Dysuria       Relevant Orders   POCT urinalysis dipstick   Pelvic fullness in female    -  Will check pelvic ultrasound, normal bimanual exam, no other symptoms  Discussed that there have been no changes in the guidelines for routine screening for ovarian cancer but will check ultrasound based on her age and symptoms   Relevant Orders   US Pelvic Complete With Transvaginal   POCT Wet + KOH Prep (Completed)      No orders of the defined types were placed in this encounter.   Follow-up: No follow-ups on file.    Forrest Moron, MD

## 2019-03-29 ENCOUNTER — Other Ambulatory Visit: Payer: Self-pay | Admitting: Cardiology

## 2019-04-06 ENCOUNTER — Other Ambulatory Visit: Payer: Self-pay

## 2019-04-06 ENCOUNTER — Ambulatory Visit (INDEPENDENT_AMBULATORY_CARE_PROVIDER_SITE_OTHER): Payer: Medicare Other | Admitting: Emergency Medicine

## 2019-04-06 ENCOUNTER — Encounter: Payer: Self-pay | Admitting: Emergency Medicine

## 2019-04-06 VITALS — BP 131/80 | HR 70 | Temp 98.4°F | Resp 16 | Wt 109.6 lb

## 2019-04-06 DIAGNOSIS — I10 Essential (primary) hypertension: Secondary | ICD-10-CM

## 2019-04-06 DIAGNOSIS — I480 Paroxysmal atrial fibrillation: Secondary | ICD-10-CM | POA: Diagnosis not present

## 2019-04-06 DIAGNOSIS — Z0000 Encounter for general adult medical examination without abnormal findings: Secondary | ICD-10-CM

## 2019-04-06 DIAGNOSIS — Z7901 Long term (current) use of anticoagulants: Secondary | ICD-10-CM

## 2019-04-06 DIAGNOSIS — Z0001 Encounter for general adult medical examination with abnormal findings: Secondary | ICD-10-CM | POA: Diagnosis not present

## 2019-04-06 DIAGNOSIS — E7800 Pure hypercholesterolemia, unspecified: Secondary | ICD-10-CM

## 2019-04-06 NOTE — Progress Notes (Signed)
11/15/2018 cardiology visit:  ASSESSMENT AND PLAN:  1. PAF: She is doing well and continues on Eliquis. She is medically compliant and does not have recurrent symptoms of rapid HR.  She is given refills on metoprolol, should continue Flecainide as directed.   2. Hypertension: I have rechecked her BP in the exam room. 138/68. No changes in her medication regimen.   3. GERD: Continue medical management.    Current medicines are reviewed at length with the patient today.    Labs/ tests ordered today include: None   Donna MareKathryn Simmons. Liborio NixonLawrence Simmons, ANP, AACC   Donna Simmons 78 y.o.   Chief Complaint  Patient presents with  . Annual Exam    per patient no pap    HISTORY OF PRESENT ILLNESS: This is a 78 y.o. female here for annual exam.  Has no complaints or medical concerns. Active problem list reviewed.  Compliant with medications.  HPI   Prior to Admission medications   Medication Sig Start Date End Date Taking? Authorizing Provider  acetaminophen (TYLENOL) 325 MG tablet Take 650 mg by mouth daily as needed for headache (pain).   Yes [provider]  amLODipine (NORVASC) 10 MG tablet Take 10 mg by mouth daily.  09/14/17  Yes [provider]  cloNIDine (CATAPRES) 0.1 MG tablet May take a tablet as needed for blood pressure greater than sytolic 170. Patient taking differently: Take 0.1 mg by mouth daily as needed (SBP >170).  08/03/17  Yes Donna LexHarding, David W, MD  ELIQUIS 5 MG TABS tablet Take 1 tablet by mouth twice daily 02/03/19  Yes Donna LexHarding, David W, MD  ENSURE (ENSURE) Take 237 mLs by mouth daily.   Yes [provider]  flecainide (TAMBOCOR) 50 MG tablet Take 1 tablet (50 mg total) by mouth 2 (two) times daily. 08/10/18  Yes Creig HinesBerge, Christopher Ronald, NP  fluticasone (FLONASE) 50 MCG/ACT nasal spray USE TWO SPRAY(S) IN EACH NOSTRIL ONCE DAILY Patient taking differently: Place 2 sprays into both nostrils daily as needed for allergies.  01/05/17  Yes  Donna FloodGreene, Jeffrey R, MD  lisinopril (ZESTRIL) 20 MG tablet Take 1 tablet by mouth once daily 03/30/19  Yes Donna LexHarding, David W, MD  Multiple Vitamin (MULTIVITAMIN WITH MINERALS) TABS tablet Take 1 tablet by mouth daily with lunch. Centrum Silver   Yes [provider]  omeprazole (PRILOSEC) 20 MG capsule Take 1 capsule (20 mg total) by mouth 2 (two) times daily before a meal. Take before breakfast. Patient taking differently: Take 20 mg by mouth daily before breakfast.  11/12/17  Yes Ofilia Neaslark, Michael L, PA-C  sucralfate (CARAFATE) 1 GM/10ML suspension Take 10 mLs (1 g total) by mouth 4 (four) times daily -  with meals and at bedtime. Patient taking differently: Take 1 g by mouth daily as needed (when eating gas producing foods).  01/05/18  Yes Donna FloodGreene, Jeffrey R, MD  atorvastatin (LIPITOR) 20 MG tablet Take 1 tablet (20 mg total) by mouth at bedtime. Patient not taking: Reported on 04/06/2019 02/01/18 07/16/26  Donna LexHarding, David W, MD  metoprolol succinate (TOPROL-XL) 50 MG 24 hr tablet Take 1 tablet (50 mg total) by mouth daily. Take with or immediately following a meal. 11/15/18 02/13/19  Donna GrossLawrence, Kathryn M, NP    Allergies  Allergen Reactions  . Antihistamines, Chlorpheniramine-Type Palpitations    Makes heart beat fast    Patient Active Problem List   Diagnosis Date Noted  . Long term current use of anticoagulant therapy 11/18/2012  . Hypercholesteremia 09/25/2012  .  DDD (degenerative disc disease), cervical 05/19/2012  . Paroxysmal atrial fibrillation (HCC) - CHA2DS2-VASc Score 3, on Eliquis 04/20/2012  . Essential hypertension 04/20/2012    Past Medical History:  Diagnosis Date  . Allergy   . GERD (gastroesophageal reflux disease)   . Heart murmur    No significant valvular lesion noted on echo.  . Hypercholesteremia   . Hypertension   . Palpitations 01/10/2009   14 day monitor- some sinus tachycardia, PVCs  . Paroxysmal atrial fibrillation ([place]) 12/27/2008    Past Surgical History:   Procedure Laterality Date  . ABDOMINAL HYSTERECTOMY  1980  . BREAST SURGERY    . CARDIAC CATHETERIZATION  12/16/2010   no evidence of CAD to explain anginal pain Simmons/ positive troponin.  potential etiology is breakthrough AF  . EYE SURGERY Right 12/29/2016  . EYE SURGERY Left 01/19/2017  . FRACTURE SURGERY    . NM MYOVIEW LTD  April 2010   EF 64%, normal pattern of perfusion in all regions, no scintigraphic evidence of inducible ischemia; no significant wall motion abnormalities; EKG negative for ischemia; no significant change from last study; low risk scan  . TRANSTHORACIC ECHOCARDIOGRAM  07/2018   Normal EF 55-60%.  No RWMA. GR 1 DD. Ao Sclerosis - no Stenosis. Mild AI. Trivial MR.     Social History   Socioeconomic History  . Marital status: Married    Spouse name: Not on file  . Number of children: Not on file  . Years of education: Not on file  . Highest education level: Not on file  Occupational History  . Not on file  Social Needs  . Financial resource strain: Not on file  . Food insecurity    Worry: Not on file    Inability: Not on file  . Transportation needs    Medical: Not on file    Non-medical: Not on file  Tobacco Use  . Smoking status: Never Smoker  . Smokeless tobacco: Never Used  Substance and Sexual Activity  . Alcohol use: No    Alcohol/week: 0.0 standard drinks  . Drug use: No  . Sexual activity: Yes  Lifestyle  . Physical activity    Days per week: Not on file    Minutes per session: Not on file  . Stress: Not on file  Relationships  . Social Herbalist on phone: Not on file    Gets together: Not on file    Attends religious service: Not on file    Active member of club or organization: Not on file    Attends meetings of clubs or organizations: Not on file    Relationship status: Not on file  . Intimate partner violence    Fear of current or ex partner: Not on file    Emotionally abused: Not on file    Physically abused: Not on  file    Forced sexual activity: Not on file  Other Topics Concern  . Not on file  Social History Narrative   Married with no children. Walks several times a week at least 3-4 times a week about 2 miles a time. Does not smoke and does not drink.    Family History  Problem Relation Age of Onset  . Arthritis Mother        OSTEO  . Hypertension Mother   . Dementia Mother   . Heart disease Father   . Hypertension Sister   . Dementia Sister   . Cancer - Lung Brother   .  Cancer Maternal Grandmother   . Heart disease Maternal Grandfather   . Cancer - Lung Brother   . Hypertension Brother   . Cancer - Prostate Brother      Review of Systems  Constitutional: Negative.  Negative for chills and fever.  HENT: Negative.  Negative for congestion and sore throat.   Eyes: Negative.   Respiratory: Negative.  Negative for cough and shortness of breath.   Cardiovascular: Negative.  Negative for chest pain, palpitations and leg swelling.  Gastrointestinal: Negative.  Negative for blood in stool, diarrhea, melena, nausea and vomiting.  Genitourinary: Negative.  Negative for dysuria.  Musculoskeletal: Negative for back pain, myalgias and neck pain.  Skin: Negative.  Negative for rash.  Neurological: Negative.  Negative for dizziness and headaches.  All other systems reviewed and are negative.   Today's Vitals   04/06/19 0908  BP: 131/80  Pulse: 70  Resp: 16  Temp: 98.4 F (36.9 C)  TempSrc: Oral  SpO2: 99%  Weight: 109 lb 9.6 oz (49.7 kg)   Body mass index is 18.81 kg/Simmons.  Physical Exam Vitals signs reviewed.  Constitutional:      Appearance: Normal appearance.  HENT:     Head: Normocephalic and atraumatic.     Nose: Nose normal.  Eyes:     Extraocular Movements: Extraocular movements intact.     Conjunctiva/sclera: Conjunctivae normal.     Pupils: Pupils are equal, round, and reactive to light.  Neck:     Musculoskeletal: Normal range of motion and neck supple. No muscular  tenderness.  Cardiovascular:     Rate and Rhythm: Normal rate and regular rhythm.     Heart sounds: Normal heart sounds.     Comments: No A. fib Pulmonary:     Effort: Pulmonary effort is normal.     Breath sounds: Normal breath sounds.  Abdominal:     General: There is no distension.     Palpations: Abdomen is soft.     Tenderness: There is no abdominal tenderness.  Musculoskeletal: Normal range of motion.  Skin:    General: Skin is warm and dry.     Capillary Refill: Capillary refill takes less than 2 seconds.  Neurological:     General: No focal deficit present.     Mental Status: She is alert and oriented to person, place, and time.  Psychiatric:        Mood and Affect: Mood normal.        Behavior: Behavior normal.      ASSESSMENT & PLAN: Donna Simmons was seen today for annual exam.  Diagnoses and all orders for this visit:  Routine general medical examination at a health care facility  Essential hypertension -     CBC with Differential/Platelet -     Comprehensive metabolic panel  Paroxysmal atrial fibrillation (HCC) - CHA2DS2-VASc Score 3, on Eliquis -     Comprehensive metabolic panel  Hypercholesteremia -     Lipid panel  Long term current use of anticoagulant therapy -     CBC with Differential/Platelet    Patient Instructions       If you have lab work done today you will be contacted with your lab results within the next 2 weeks.  If you have not heard from [place] then please contact us. The fastest way to get your results is to register for My Chart.   IF you received an x-ray today, you will receive an invoice from [facility]. Please contact [facility] at  [phone] with questions or concerns regarding your invoice.   IF you received labwork today, you will receive an invoice from [facility]. Please contact [facility] at [phone] with questions or concerns regarding your invoice.   Our billing staff will not be able to assist you  with questions regarding bills from these companies.  You will be contacted with the lab results as soon as they are available. The fastest way to get your results is to activate your My Chart account. Instructions are located on the last page of this paperwork. If you have not heard from us regarding the results in 2 weeks, please contact this office.      Health Maintenance, Female Adopting a healthy lifestyle and getting preventive care are important in promoting health and wellness. Ask your health care provider about:  The right schedule for you to have regular tests and exams.  Things you can do on your own to prevent diseases and keep yourself healthy. What should I know about diet, weight, and exercise? Eat a healthy diet   Eat a diet that includes plenty of vegetables, fruits, low-fat dairy products, and lean protein.  Do not eat a lot of foods that are high in solid fats, added sugars, or sodium. Maintain a healthy weight Body mass index (BMI) is used to identify weight problems. It estimates body fat based on height and weight. Your health care provider can help determine your BMI and help you achieve or maintain a healthy weight. Get regular exercise Get regular exercise. This is one of the most important things you can do for your health. Most adults should:  Exercise for at least 150 minutes each week. The exercise should increase your heart rate and make you sweat (moderate-intensity exercise).  Do strengthening exercises at least twice a week. This is in addition to the moderate-intensity exercise.  Spend less time sitting. Even light physical activity can be beneficial. Watch cholesterol and blood lipids Have your blood tested for lipids and cholesterol at 78 years of age, then have this test every 5 years. Have your cholesterol levels checked more often if:  Your lipid or cholesterol levels are high.  You are older than 76 years of age.  You are at high risk  for heart disease. What should I know about cancer screening? Depending on your health history and family history, you may need to have cancer screening at various ages. This may include screening for:  Breast cancer.  Cervical cancer.  Colorectal cancer.  Skin cancer.  Lung cancer. What should I know about heart disease, diabetes, and high blood pressure? Blood pressure and heart disease  High blood pressure causes heart disease and increases the risk of stroke. This is more likely to develop in people who have high blood pressure readings, are of African descent, or are overweight.  Have your blood pressure checked: ? Every 3-5 years if you are 6918-76 years of age. ? Every year if you are 76 years old or older. Diabetes Have regular diabetes screenings. This checks your fasting blood sugar level. Have the screening done:  Once every three years after age 76 if you are at a normal weight and have a low risk for diabetes.  More often and at a younger age if you are overweight or have a high risk for diabetes. What should I know about preventing infection? Hepatitis B If you have a higher risk for hepatitis B, you should be screened for this virus. Talk with your health care  provider to find out if you are at risk for hepatitis B infection. Hepatitis C Testing is recommended for:  Everyone born from [birth year] through 1963.  Anyone with known risk factors for hepatitis C. Sexually transmitted infections (STIs)  Get screened for STIs, including gonorrhea and chlamydia, if: ? You are sexually active and are younger than 76 years of age. ? You are older than 76 years of age and your health care provider tells you that you are at risk for this type of infection. ? Your sexual activity has changed since you were last screened, and you are at increased risk for chlamydia or gonorrhea. Ask your health care provider if you are at risk.  Ask your health care provider about whether you are  at high risk for HIV. Your health care provider may recommend a prescription medicine to help prevent HIV infection. If you choose to take medicine to prevent HIV, you should first get tested for HIV. You should then be tested every 3 months for as long as you are taking the medicine. Pregnancy  If you are about to stop having your period (premenopausal) and you may become pregnant, seek counseling before you get pregnant.  Take 400 to 800 micrograms (mcg) of folic acid every day if you become pregnant.  Ask for birth control (contraception) if you want to prevent pregnancy. Osteoporosis and menopause Osteoporosis is a disease in which the bones lose minerals and strength with aging. This can result in bone fractures. If you are 60 years old or older, or if you are at risk for osteoporosis and fractures, ask your health care provider if you should:  Be screened for bone loss.  Take a calcium or vitamin D supplement to lower your risk of fractures.  Be given hormone replacement therapy (HRT) to treat symptoms of menopause. Follow these instructions at home: Lifestyle  Do not use any products that contain nicotine or tobacco, such as cigarettes, e-cigarettes, and chewing tobacco. If you need help quitting, ask your health care provider.  Do not use street drugs.  Do not share needles.  Ask your health care provider for help if you need support or information about quitting drugs. Alcohol use  Do not drink alcohol if: ? Your health care provider tells you not to drink. ? You are pregnant, may be pregnant, or are planning to become pregnant.  If you drink alcohol: ? Limit how much you use to 0-1 drink a day. ? Limit intake if you are breastfeeding.  Be aware of how much alcohol is in your drink. In the [place] one drink equals one 12 oz bottle of beer (355 mL), one 5 oz glass of wine (148 mL), or one 1 oz glass of hard liquor (44 mL). General instructions  Schedule regular health,  dental, and eye exams.  Stay current with your vaccines.  Tell your health care provider if: ? You often feel depressed. ? You have ever been abused or do not feel safe at home. Summary  Adopting a healthy lifestyle and getting preventive care are important in promoting health and wellness.  Follow your health care provider's instructions about healthy diet, exercising, and getting tested or screened for diseases.  Follow your health care provider's instructions on monitoring your cholesterol and blood pressure. This information is not intended to replace advice given to you by your health care provider. Make sure you discuss any questions you have with your health care provider. Document Released: 03/03/2011 Document Revised: 08/11/2018 Document  Reviewed: 08/11/2018 Elsevier Patient Education  2020 Elsevier Inc.      Edwina BarthMiguel Marium Ragan, MD [facility]

## 2019-04-06 NOTE — Patient Instructions (Addendum)
   If you have lab work done today you will be contacted with your lab results within the next 2 weeks.  If you have not heard from us then please contact us. The fastest way to get your results is to register for My Chart.   IF you received an x-ray today, you will receive an invoice from [facility]. Please contact [facility] at [phone] with questions or concerns regarding your invoice.   IF you received labwork today, you will receive an invoice from [facility]. Please contact [facility] at [phone] with questions or concerns regarding your invoice.   Our billing staff will not be able to assist you with questions regarding bills from these companies.  You will be contacted with the lab results as soon as they are available. The fastest way to get your results is to activate your My Chart account. Instructions are located on the last page of this paperwork. If you have not heard from us regarding the results in 2 weeks, please contact this office.      Health Maintenance, Female Adopting a healthy lifestyle and getting preventive care are important in promoting health and wellness. Ask your health care provider about:  The right schedule for you to have regular tests and exams.  Things you can do on your own to prevent diseases and keep yourself healthy. What should I know about diet, weight, and exercise? Eat a healthy diet   Eat a diet that includes plenty of vegetables, fruits, low-fat dairy products, and lean protein.  Do not eat a lot of foods that are high in solid fats, added sugars, or sodium. Maintain a healthy weight Body mass index (BMI) is used to identify weight problems. It estimates body fat based on height and weight. Your health care provider can help determine your BMI and help you achieve or maintain a healthy weight. Get regular exercise Get regular exercise. This is one of the most important things you can do for your health. Most  adults should:  Exercise for at least 150 minutes each week. The exercise should increase your heart rate and make you sweat (moderate-intensity exercise).  Do strengthening exercises at least twice a week. This is in addition to the moderate-intensity exercise.  Spend less time sitting. Even light physical activity can be beneficial. Watch cholesterol and blood lipids Have your blood tested for lipids and cholesterol at 76 years of age, then have this test every 5 years. Have your cholesterol levels checked more often if:  Your lipid or cholesterol levels are high.  You are older than 76 years of age.  You are at high risk for heart disease. What should I know about cancer screening? Depending on your health history and family history, you may need to have cancer screening at various ages. This may include screening for:  Breast cancer.  Cervical cancer.  Colorectal cancer.  Skin cancer.  Lung cancer. What should I know about heart disease, diabetes, and high blood pressure? Blood pressure and heart disease  High blood pressure causes heart disease and increases the risk of stroke. This is more likely to develop in people who have high blood pressure readings, are of African descent, or are overweight.  Have your blood pressure checked: ? Every 3-5 years if you are 18-39 years of age. ? Every year if you are 40 years old or older. Diabetes Have regular diabetes screenings. This checks your fasting blood sugar level. Have the screening done:  Once every   three years after age 40 if you are at a normal weight and have a low risk for diabetes.  More often and at a younger age if you are overweight or have a high risk for diabetes. What should I know about preventing infection? Hepatitis B If you have a higher risk for hepatitis B, you should be screened for this virus. Talk with your health care provider to find out if you are at risk for hepatitis B infection. Hepatitis  C Testing is recommended for:  Everyone born from 1945 through 1965.  Anyone with known risk factors for hepatitis C. Sexually transmitted infections (STIs)  Get screened for STIs, including gonorrhea and chlamydia, if: ? You are sexually active and are younger than 76 years of age. ? You are older than 76 years of age and your health care provider tells you that you are at risk for this type of infection. ? Your sexual activity has changed since you were last screened, and you are at increased risk for chlamydia or gonorrhea. Ask your health care provider if you are at risk.  Ask your health care provider about whether you are at high risk for HIV. Your health care provider may recommend a prescription medicine to help prevent HIV infection. If you choose to take medicine to prevent HIV, you should first get tested for HIV. You should then be tested every 3 months for as long as you are taking the medicine. Pregnancy  If you are about to stop having your period (premenopausal) and you may become pregnant, seek counseling before you get pregnant.  Take 400 to 800 micrograms (mcg) of folic acid every day if you become pregnant.  Ask for birth control (contraception) if you want to prevent pregnancy. Osteoporosis and menopause Osteoporosis is a disease in which the bones lose minerals and strength with aging. This can result in bone fractures. If you are 65 years old or older, or if you are at risk for osteoporosis and fractures, ask your health care provider if you should:  Be screened for bone loss.  Take a calcium or vitamin D supplement to lower your risk of fractures.  Be given hormone replacement therapy (HRT) to treat symptoms of menopause. Follow these instructions at home: Lifestyle  Do not use any products that contain nicotine or tobacco, such as cigarettes, e-cigarettes, and chewing tobacco. If you need help quitting, ask your health care provider.  Do not use street  drugs.  Do not share needles.  Ask your health care provider for help if you need support or information about quitting drugs. Alcohol use  Do not drink alcohol if: ? Your health care provider tells you not to drink. ? You are pregnant, may be pregnant, or are planning to become pregnant.  If you drink alcohol: ? Limit how much you use to 0-1 drink a day. ? Limit intake if you are breastfeeding.  Be aware of how much alcohol is in your drink. In the [place] one drink equals one 12 oz bottle of beer (355 mL), one 5 oz glass of wine (148 mL), or one 1 oz glass of hard liquor (44 mL). General instructions  Schedule regular health, dental, and eye exams.  Stay current with your vaccines.  Tell your health care provider if: ? You often feel depressed. ? You have ever been abused or do not feel safe at home. Summary  Adopting a healthy lifestyle and getting preventive care are important in promoting health and wellness.    Follow your health care provider's instructions about healthy diet, exercising, and getting tested or screened for diseases.  Follow your health care provider's instructions on monitoring your cholesterol and blood pressure. This information is not intended to replace advice given to you by your health care provider. Make sure you discuss any questions you have with your health care provider. Document Released: 03/03/2011 Document Revised: 08/11/2018 Document Reviewed: 08/11/2018 Elsevier Patient Education  2020 Elsevier Inc.  

## 2019-04-07 ENCOUNTER — Other Ambulatory Visit: Payer: Self-pay | Admitting: Family Medicine

## 2019-04-07 ENCOUNTER — Encounter: Payer: Medicare Other | Admitting: Family Medicine

## 2019-04-07 ENCOUNTER — Ambulatory Visit
Admission: RE | Admit: 2019-04-07 | Discharge: 2019-04-07 | Disposition: A | Discharge status: Home / Self Care | Payer: Medicare Other | Source: Ambulatory Visit | Attending: Family Medicine | Admitting: Family Medicine

## 2019-04-07 ENCOUNTER — Other Ambulatory Visit: Payer: Self-pay | Admitting: Emergency Medicine

## 2019-04-07 DIAGNOSIS — R944 Abnormal results of kidney function studies: Secondary | ICD-10-CM

## 2019-04-07 DIAGNOSIS — R1900 Intra-abdominal and pelvic swelling, mass and lump, unspecified site: Secondary | ICD-10-CM

## 2019-04-07 LAB — CBC WITH DIFFERENTIAL/PLATELET
Basophils Absolute: 0 10*3/uL (ref 0.0–0.2)
Basos: 1 %
EOS (ABSOLUTE): 0 10*3/uL (ref 0.0–0.4)
Eos: 0 %
Hematocrit: 38.2 % (ref 34.0–46.6)
Hemoglobin: 12 g/dL (ref 11.1–15.9)
Immature Grans (Abs): 0 10*3/uL (ref 0.0–0.1)
Immature Granulocytes: 0 %
Lymphocytes Absolute: 1 10*3/uL (ref 0.7–3.1)
Lymphs: 32 %
MCH: 26 pg — ABNORMAL LOW (ref 26.6–33.0)
MCHC: 31.4 g/dL — ABNORMAL LOW (ref 31.5–35.7)
MCV: 83 fL (ref 79–97)
Monocytes Absolute: 0.3 10*3/uL (ref 0.1–0.9)
Monocytes: 9 %
Neutrophils Absolute: 1.8 10*3/uL (ref 1.4–7.0)
Neutrophils: 58 %
Platelets: 214 10*3/uL (ref 150–450)
RBC: 4.61 x10E6/uL (ref 3.77–5.28)
RDW: 14.3 % (ref 11.7–15.4)
WBC: 3.1 10*3/uL — ABNORMAL LOW (ref 3.4–10.8)

## 2019-04-07 LAB — COMPREHENSIVE METABOLIC PANEL
ALT: 12 IU/L (ref 0–32)
AST: 19 IU/L (ref 0–40)
Albumin/Globulin Ratio: 1.8 (ref 1.2–2.2)
Albumin: 4.6 g/dL (ref 3.7–4.7)
Alkaline Phosphatase: 59 IU/L (ref 39–117)
BUN/Creatinine Ratio: 16 (ref 12–28)
BUN: 18 mg/dL (ref 8–27)
Bilirubin Total: 0.4 mg/dL (ref 0.0–1.2)
CO2: 26 mmol/L (ref 20–29)
Calcium: 9.9 mg/dL (ref 8.7–10.3)
Chloride: 102 mmol/L (ref 96–106)
Creatinine, Ser: 1.12 mg/dL — ABNORMAL HIGH (ref 0.57–1.00)
GFR calc Af Amer: 56 mL/min/{1.73_m2} — ABNORMAL LOW (ref >59)
GFR calc non Af Amer: 48 mL/min/{1.73_m2} — ABNORMAL LOW (ref >59)
Globulin, Total: 2.6 g/dL (ref 1.5–4.5)
Glucose: 92 mg/dL (ref 65–99)
Potassium: 4.5 mmol/L (ref 3.5–5.2)
Sodium: 142 mmol/L (ref 134–144)
Total Protein: 7.2 g/dL (ref 6.0–8.5)

## 2019-04-07 LAB — LIPID PANEL
Chol/HDL Ratio: 2.9 ratio (ref 0.0–4.4)
Cholesterol, Total: 243 mg/dL — ABNORMAL HIGH (ref 100–199)
HDL: 84 mg/dL (ref >39)
LDL Calculated: 141 mg/dL — ABNORMAL HIGH (ref 0–99)
Triglycerides: 90 mg/dL (ref 0–149)
VLDL Cholesterol Cal: 18 mg/dL (ref 5–40)

## 2019-04-12 ENCOUNTER — Ambulatory Visit (INDEPENDENT_AMBULATORY_CARE_PROVIDER_SITE_OTHER): Payer: Medicare Other | Admitting: Emergency Medicine

## 2019-04-12 VITALS — BP 138/72 | Ht 64.0 in | Wt 109.0 lb

## 2019-04-12 DIAGNOSIS — Z0000 Encounter for general adult medical examination without abnormal findings: Secondary | ICD-10-CM

## 2019-04-12 NOTE — Patient Instructions (Addendum)
Thank you for taking time to come for your Medicare Wellness Visit. I appreciate your ongoing commitment to your health goals. Please review the following plan we discussed and let me know if I can assist you in the future.  Julie Greer LPN  Preventive Care 76 Years and Older, Female Preventive care refers to lifestyle choices and visits with your health care provider that can promote health and wellness. This includes:  A yearly physical exam. This is also called an annual well check.  Regular dental and eye exams.  Immunizations.  Screening for certain conditions.  Healthy lifestyle choices, such as diet and exercise. What can I expect for my preventive care visit? Physical exam Your health care provider will check:  Height and weight. These may be used to calculate body mass index (BMI), which is a measurement that tells if you are at a healthy weight.  Heart rate and blood pressure.  Your skin for abnormal spots. Counseling Your health care provider may ask you questions about:  Alcohol, tobacco, and drug use.  Emotional well-being.  Home and relationship well-being.  Sexual activity.  Eating habits.  History of falls.  Memory and ability to understand (cognition).  Work and work environment.  Pregnancy and menstrual history. What immunizations do I need?  Influenza (flu) vaccine  This is recommended every year. Tetanus, diphtheria, and pertussis (Tdap) vaccine  You may need a Td booster every 10 years. Varicella (chickenpox) vaccine  You may need this vaccine if you have not already been vaccinated. Zoster (shingles) vaccine  You may need this after age 60. Pneumococcal conjugate (PCV13) vaccine  One dose is recommended after age 76. Pneumococcal polysaccharide (PPSV23) vaccine  One dose is recommended after age 76. Measles, mumps, and rubella (MMR) vaccine  You may need at least one dose of MMR if you were born in 1957 or later. You may also  need a second dose. Meningococcal conjugate (MenACWY) vaccine  You may need this if you have certain conditions. Hepatitis A vaccine  You may need this if you have certain conditions or if you travel or work in places where you may be exposed to hepatitis A. Hepatitis B vaccine  You may need this if you have certain conditions or if you travel or work in places where you may be exposed to hepatitis B. Haemophilus influenzae type b (Hib) vaccine  You may need this if you have certain conditions. You may receive vaccines as individual doses or as more than one vaccine together in one shot (combination vaccines). Talk with your health care provider about the risks and benefits of combination vaccines. What tests do I need? Blood tests  Lipid and cholesterol levels. These may be checked every 5 years, or more frequently depending on your overall health.  Hepatitis C test.  Hepatitis B test. Screening  Lung cancer screening. You may have this screening every year starting at age 76 if you have a 30-pack-year history of smoking and currently smoke or have quit within the past 15 years.  Colorectal cancer screening. All adults should have this screening starting at age 76 and continuing until age 76. Your health care provider may recommend screening at age 45 if you are at increased risk. You will have tests every 1-10 years, depending on your results and the type of screening test.  Diabetes screening. This is done by checking your blood sugar (glucose) after you have not eaten for a while (fasting). You may have this done every 1-3   years.  Mammogram. This may be done every 1-2 years. Talk with your health care provider about how often you should have regular mammograms.  BRCA-related cancer screening. This may be done if you have a family history of breast, ovarian, tubal, or peritoneal cancers. Other tests  Sexually transmitted disease (STD) testing.  Bone density scan. This is done  to screen for osteoporosis. You may have this done starting at age 76. Follow these instructions at home: Eating and drinking  Eat a diet that includes fresh fruits and vegetables, whole grains, lean protein, and low-fat dairy products. Limit your intake of foods with high amounts of sugar, saturated fats, and salt.  Take vitamin and mineral supplements as recommended by your health care provider.  Do not drink alcohol if your health care provider tells you not to drink.  If you drink alcohol: ? Limit how much you have to 0-1 drink a day. ? Be aware of how much alcohol is in your drink. In the [place] one drink equals one 12 oz bottle of beer (355 mL), one 5 oz glass of wine (148 mL), or one 1 oz glass of hard liquor (44 mL). Lifestyle  Take daily care of your teeth and gums.  Stay active. Exercise for at least 30 minutes on 5 or more days each week.  Do not use any products that contain nicotine or tobacco, such as cigarettes, e-cigarettes, and chewing tobacco. If you need help quitting, ask your health care provider.  If you are sexually active, practice safe sex. Use a condom or other form of protection in order to prevent STIs (sexually transmitted infections).  Talk with your health care provider about taking a low-dose aspirin or statin. What's next?  Go to your health care provider once a year for a well check visit.  Ask your health care provider how often you should have your eyes and teeth checked.  Stay up to date on all vaccines. This information is not intended to replace advice given to you by your health care provider. Make sure you discuss any questions you have with your health care provider. Document Released: 09/14/2015 Document Revised: 08/12/2018 Document Reviewed: 08/12/2018 Elsevier Patient Education  2020 [employer].

## 2019-04-12 NOTE — Progress Notes (Addendum)
Presents today for [employer] [facility] [employer] [facility] Annual [facility]   Date of last exam:04/06/2019  Interpreter used for this visit? No  I connected with  Donna Simmons on 04/12/19 by telephone  and verified that I am speaking with the correct person using two identifiers.  .   Patient Care Team: Georgina QuintSagardia, Miguel Jose, MD as PCP - General (Internal Medicine) Marykay LexHarding, David W, MD as PCP - Cardiology (Cardiology) Marykay LexHarding, David W, MD as Attending Physician (Cardiology) Jeani HawkingHung, Patrick, MD as Consulting Physician (Gastroenterology)   Other items to address today:   Discussed Eye/Dental Discussed Immunizations    Other Screening:  Last lipid screening: 04/06/2019  ADVANCE DIRECTIVES: Discussed: Yes On File no (copy requested) Materials Provided: no  Immunization status:  Immunization History  Administered Date(s) Administered  . Hepatitis A 03/22/1998  . Influenza Split 08/02/2012, 06/20/2014  . Influenza,inj,Quad PF,6+ Mos 07/21/2013, 06/04/2015, 06/21/2016, 05/21/2017, 04/26/2018  . Influenza-Unspecified 06/01/2014  . PPD Test 04/26/2013  . Pneumococcal Conjugate-13 08/29/2015  . Pneumococcal Polysaccharide-23 09/01/2005  . Td 03/22/1998, 07/31/2010  . Tdap 09/01/2009  . Zoster 08/02/2012     Health Maintenance Due  Topic Date Due  . INFLUENZA VACCINE  04/02/2019     Functional Status Survey: Is the patient deaf or have difficulty hearing?: No Does the patient have difficulty seeing, even when wearing glasses/contacts?: No Does the patient have difficulty concentrating, remembering, or making decisions?: No Does the patient have difficulty walking or climbing stairs?: No Does the patient have difficulty dressing or bathing?: No Does the patient have difficulty doing errands alone such as visiting a doctor's office or shopping?: No   6CIT Screen 04/12/2019 04/12/2019  What Year? 0 points 0 points  What month? 0 points 0 points  What time? 0 points -  Count back from  20 0 points -  Months in reverse 0 points -  Repeat phrase 6 points -  Total Score 6 -        Clinical Support from 04/12/2019 in [facility]  AUDIT-C Score  0       Home Environment:   Lives in two story home with husband No trouble climbing stairs No scattered rugs Yes grab bars Has a walk in shower No clutter/ Adequate lighting   Patient Active Problem List   Diagnosis Date Noted  . Long term current use of anticoagulant therapy 11/18/2012  . Hypercholesteremia 09/25/2012  . DDD (degenerative disc disease), cervical 05/19/2012  . Paroxysmal atrial fibrillation (HCC) - CHA2DS2-VASc Score 3, on Eliquis 04/20/2012  . Essential hypertension 04/20/2012     Past Medical History:  Diagnosis Date  . Allergy   . GERD (gastroesophageal reflux disease)   . Heart murmur    No significant valvular lesion noted on echo.  . Hypercholesteremia   . Hypertension   . Palpitations 01/10/2009   14 day monitor- some sinus tachycardia, PVCs  . Paroxysmal atrial fibrillation (HCC) 12/27/2008     Past Surgical History:  Procedure Laterality Date  . ABDOMINAL HYSTERECTOMY  1980  . BREAST SURGERY    . CARDIAC CATHETERIZATION  12/16/2010   no evidence of CAD to explain anginal pain w/ positive troponin.  potential etiology is breakthrough AF  . EYE SURGERY Right 12/29/2016  . EYE SURGERY Left 01/19/2017  . FRACTURE SURGERY    . NM MYOVIEW LTD  April 2010   EF 64%, normal pattern of perfusion in all regions, no scintigraphic evidence of inducible ischemia; no significant wall motion abnormalities;  EKG negative for ischemia; no significant change from last study; low risk scan  . TRANSTHORACIC ECHOCARDIOGRAM  07/2018   Normal EF 55-60%.  No RWMA. GR 1 DD. Ao Sclerosis - no Stenosis. Mild AI. Trivial MR.      Family History  Problem Relation Age of Onset  . Arthritis Mother        OSTEO  . Hypertension Mother   . Dementia Mother   . Heart disease Father   .  Hypertension Sister   . Dementia Sister   . Cancer - Lung Brother   . Cancer Maternal Grandmother   . Heart disease Maternal Grandfather   . Cancer - Lung Brother   . Hypertension Brother   . Cancer - Prostate Brother      Social History   Socioeconomic History  . Marital status: Married    Spouse name: Not on file  . Number of children: Not on file  . Years of education: Not on file  . Highest education level: Not on file  Occupational History  . Not on file  Social Needs  . Financial resource strain: Not on file  . Food insecurity    Worry: Not on file    Inability: Not on file  . Transportation needs    Medical: Not on file    Non-medical: Not on file  Tobacco Use  . Smoking status: Never Smoker  . Smokeless tobacco: Never Used  Substance and Sexual Activity  . Alcohol use: No    Alcohol/week: 0.0 standard drinks  . Drug use: No  . Sexual activity: Yes  Lifestyle  . Physical activity    Days per week: Not on file    Minutes per session: Not on file  . Stress: Not on file  Relationships  . Social Herbalist on phone: Not on file    Gets together: Not on file    Attends religious service: Not on file    Active member of club or organization: Not on file    Attends meetings of clubs or organizations: Not on file    Relationship status: Not on file  . Intimate partner violence    Fear of current or ex partner: Not on file    Emotionally abused: Not on file    Physically abused: Not on file    Forced sexual activity: Not on file  Other Topics Concern  . Not on file  Social History Narrative   Married with no children. Walks several times a week at least 3-4 times a week about 2 miles a time. Does not smoke and does not drink.     Allergies  Allergen Reactions  . Antihistamines, Chlorpheniramine-Type Palpitations    Makes heart beat fast     Prior to Admission medications   Medication Sig Start Date End Date Taking? Authorizing Provider   acetaminophen (TYLENOL) 325 MG tablet Take 650 mg by mouth daily as needed for headache (pain).   Yes [provider]  cloNIDine (CATAPRES) 0.1 MG tablet May take a tablet as needed for blood pressure greater than sytolic 027. Patient taking differently: Take 0.1 mg by mouth daily as needed (SBP >170).  08/03/17  Yes Leonie Man, MD  ELIQUIS 5 MG TABS tablet Take 1 tablet by mouth twice daily 02/03/19  Yes Leonie Man, MD  ENSURE (ENSURE) Take 237 mLs by mouth daily.   Yes [provider]  flecainide (TAMBOCOR) 50 MG tablet Take 1 tablet (50  mg total) by mouth 2 (two) times daily. 08/10/18  Yes Creig HinesBerge, Christopher Ronald, NP  fluticasone (FLONASE) 50 MCG/ACT nasal spray USE TWO SPRAY(S) IN EACH NOSTRIL ONCE DAILY Patient taking differently: Place 2 sprays into both nostrils daily as needed for allergies.  01/05/17  Yes Shade FloodGreene, Jeffrey R, MD  lisinopril (ZESTRIL) 20 MG tablet Take 1 tablet by mouth once daily 03/30/19  Yes Marykay LexHarding, David W, MD  Multiple Vitamin (MULTIVITAMIN WITH MINERALS) TABS tablet Take 1 tablet by mouth daily with lunch. Centrum Silver   Yes [provider]  omeprazole (PRILOSEC) 20 MG capsule Take 1 capsule (20 mg total) by mouth 2 (two) times daily before a meal. Take before breakfast. Patient taking differently: Take 20 mg by mouth daily before breakfast.  11/12/17  Yes Ofilia Neaslark, Michael L, PA-C  sucralfate (CARAFATE) 1 GM/10ML suspension Take 10 mLs (1 g total) by mouth 4 (four) times daily -  with meals and at bedtime. Patient taking differently: Take 1 g by mouth daily as needed (when eating gas producing foods).  01/05/18  Yes Shade FloodGreene, Jeffrey R, MD  amLODipine (NORVASC) 10 MG tablet Take 10 mg by mouth daily.  09/14/17   [provider]  atorvastatin (LIPITOR) 20 MG tablet Take 1 tablet (20 mg total) by mouth at bedtime. Patient not taking: Reported on 04/06/2019 02/01/18 07/16/26  Marykay LexHarding, David W, MD     Depression screen [facility] 2/9 04/12/2019  04/06/2019 03/23/2019 04/26/2018 03/24/2018  Decreased Interest 0 0 0 0 0  Down, Depressed, Hopeless 0 0 0 0 0  PHQ - 2 Score 0 0 0 0 0     Fall Risk  04/12/2019 04/06/2019 03/23/2019 04/26/2018 03/24/2018  Falls in the past year? 0 1 0 Yes No  Number falls in past yr: 0 0 0 1 -  Injury with Fall? 0 1 0 Yes -  Comment - hematoma right leg - left leg -  Follow up Falls evaluation completed;Education provided;Falls prevention discussed Falls evaluation completed Falls evaluation completed - -      PHYSICAL EXAM: BP 138/72 Comment: taken from last visit  Ht 5\' 4"  (1.626 m)   Wt 109 lb (49.4 kg)   BMI 18.71 kg/m    Wt Readings from Last 3 Encounters:  04/12/19 109 lb (49.4 kg)  04/06/19 109 lb 9.6 oz (49.7 kg)  03/23/19 114 lb 6.4 oz (51.9 kg)     No exam data present    Physical Exam   Education/Counseling provided regarding diet and exercise, prevention of chronic diseases, smoking/tobacco cessation, if applicable, and reviewed "Covered Medicare Preventive Services."  I have reviewed and agree with the above AWV documentation. Edwina BarthMiguel Sagardia, MD

## 2019-04-20 ENCOUNTER — Telehealth: Payer: Self-pay | Admitting: *Deleted

## 2019-04-20 DIAGNOSIS — I10 Essential (primary) hypertension: Secondary | ICD-10-CM

## 2019-04-20 MED ORDER — BLOOD PRESSURE CUFF MISC: 0 refills | Status: DC

## 2019-04-20 NOTE — Telephone Encounter (Signed)
Patient came by office on  04/18/19 She requested an prescription for a blood pressure  Monitor/cuff.  prescription  Awaiting for dr signature . Will contact patient when she can pick it up

## 2019-04-21 NOTE — Telephone Encounter (Signed)
rx mailedSpoke to patient -  Aware prescription is ready for pick up   RN offered to mail to patient . Patient agreed and verbalized understanding.  Rx mailed

## 2019-04-26 ENCOUNTER — Telehealth: Payer: Self-pay | Admitting: Cardiology

## 2019-04-26 NOTE — Telephone Encounter (Signed)
Follow Up:      Jasamine from [facility] called. She said they had received a prescription for a blood pressure cuff. She wanted Dr Ellyn Hack to know that they do not fill prescriptions for blood pressure cuffs.

## 2019-04-27 NOTE — Telephone Encounter (Signed)
FY --PATIENT  RECEIVED A WRITTEN PRESCRIPTION  PATIENT WANTED TO EE IF INSURANCE WOULD PAY FOR IT.  IF PATIENT CALLS BACK SHE COULD TRY A  MEDICAL SUPPLY  STORE

## 2019-05-01 ENCOUNTER — Other Ambulatory Visit: Payer: Self-pay | Admitting: Cardiology

## 2019-05-02 NOTE — Telephone Encounter (Signed)
39f 49.4kg Scr 1.12 04/06/19 Lovw/[facility] 11/15/18

## 2019-05-02 NOTE — Telephone Encounter (Signed)
Please review for refill. Thank you! 

## 2019-05-11 ENCOUNTER — Other Ambulatory Visit: Payer: Self-pay

## 2019-05-11 ENCOUNTER — Emergency Department (HOSPITAL_COMMUNITY)
Admission: EM | Admit: 2019-05-11 | Discharge: 2019-05-12 | Disposition: A | Discharge status: Home / Self Care | Payer: Medicare Other | Attending: Emergency Medicine | Admitting: Emergency Medicine

## 2019-05-11 ENCOUNTER — Emergency Department (HOSPITAL_COMMUNITY): Payer: Medicare Other

## 2019-05-11 ENCOUNTER — Encounter (HOSPITAL_COMMUNITY): Payer: Self-pay

## 2019-05-11 DIAGNOSIS — R002 Palpitations: Secondary | ICD-10-CM

## 2019-05-11 DIAGNOSIS — I10 Essential (primary) hypertension: Secondary | ICD-10-CM | POA: Insufficient documentation

## 2019-05-11 DIAGNOSIS — I4891 Unspecified atrial fibrillation: Secondary | ICD-10-CM | POA: Diagnosis not present

## 2019-05-11 DIAGNOSIS — Z79899 Other long term (current) drug therapy: Secondary | ICD-10-CM | POA: Diagnosis not present

## 2019-05-11 DIAGNOSIS — Z7901 Long term (current) use of anticoagulants: Secondary | ICD-10-CM | POA: Insufficient documentation

## 2019-05-11 DIAGNOSIS — R0789 Other chest pain: Secondary | ICD-10-CM | POA: Diagnosis not present

## 2019-05-11 LAB — BASIC METABOLIC PANEL
Anion gap: 9 (ref 5–15)
BUN: 5 mg/dL — ABNORMAL LOW (ref 8–23)
CO2: 24 mmol/L (ref 22–32)
Calcium: 8.8 mg/dL — ABNORMAL LOW (ref 8.9–10.3)
Chloride: 104 mmol/L (ref 98–111)
Creatinine, Ser: 0.93 mg/dL (ref 0.44–1.00)
GFR calc Af Amer: >60 mL/min (ref >60)
GFR calc non Af Amer: >60 mL/min (ref >60)
Glucose, Bld: 91 mg/dL (ref 70–99)
Potassium: 3.7 mmol/L (ref 3.5–5.1)
Sodium: 137 mmol/L (ref 135–145)

## 2019-05-11 LAB — TROPONIN I (HIGH SENSITIVITY): Troponin I (High Sensitivity): 5 ng/L (ref <18)

## 2019-05-11 LAB — CBC
HCT: 35.4 % — ABNORMAL LOW (ref 36.0–46.0)
Hemoglobin: 10.9 g/dL — ABNORMAL LOW (ref 12.0–15.0)
MCH: 25.5 pg — ABNORMAL LOW (ref 26.0–34.0)
MCHC: 30.8 g/dL (ref 30.0–36.0)
MCV: 82.9 fL (ref 80.0–100.0)
Platelets: 201 10*3/uL (ref 150–400)
RBC: 4.27 MIL/uL (ref 3.87–5.11)
RDW: 14.9 % (ref 11.5–15.5)
WBC: 4.3 10*3/uL (ref 4.0–10.5)
nRBC: 0 % (ref 0.0–0.2)

## 2019-05-11 MED ORDER — SODIUM CHLORIDE 0.9% FLUSH: 3.0000 mL | Freq: Once | INTRAVENOUS | Status: DC

## 2019-05-11 NOTE — ED Provider Notes (Signed)
[facility] EMERGENCY DEPARTMENT Provider Note   CSN: [id number] Arrival date & time: 05/11/19  2046     History   Chief Complaint Chief Complaint  Patient presents with  . Palpitations    HPI Donna Simmons is a 78 y.o. female with a past medical history of HTN, HLD, paroxysmal atrial fibrillation who presents to the emergency department with 15 mins of chest pressure that started after moving furniture today. Patient was given ASA and 1 nitroglycerin by EMS.  HPI: A 78 year old patient with a history of hypertension and hypercholesterolemia presents for evaluation of chest pain. Initial onset of pain was approximately 1-3 hours ago. The patient's chest pain is described as heaviness/pressure/tightness, is worse with exertion and is relieved by nitroglycerin. The patient's chest pain is middle- or left-sided, is not well-localized, is not sharp and does not radiate to the arms/jaw/neck. The patient does not complain of nausea and denies diaphoresis. The patient has no history of stroke, has no history of peripheral artery disease, has not smoked in the past 90 days, denies any history of treated diabetes, has no relevant family history of coronary artery disease (first degree relative at less than age 37) and does not have an elevated BMI (>=30).   The history is provided by the patient.    Past Medical History:  Diagnosis Date  . Allergy   . GERD (gastroesophageal reflux disease)   . Heart murmur    No significant valvular lesion noted on echo.  . Hypercholesteremia   . Hypertension   . Palpitations 01/10/2009   14 day monitor- some sinus tachycardia, PVCs  . Paroxysmal atrial fibrillation ([place]) 12/27/2008    Patient Active Problem List   Diagnosis Date Noted  . Long term current use of anticoagulant therapy 11/18/2012  . Hypercholesteremia 09/25/2012  . DDD (degenerative disc disease), cervical 05/19/2012  . Paroxysmal atrial fibrillation ([facility]) -  CHA2DS2-VASc Score 3, on Eliquis 04/20/2012  . Essential hypertension 04/20/2012    Past Surgical History:  Procedure Laterality Date  . ABDOMINAL HYSTERECTOMY  1980  . BREAST SURGERY    . CARDIAC CATHETERIZATION  12/16/2010   no evidence of CAD to explain anginal pain w/ positive troponin.  potential etiology is breakthrough AF  . EYE SURGERY Right 12/29/2016  . EYE SURGERY Left 01/19/2017  . FRACTURE SURGERY    . NM MYOVIEW LTD  April 2010   EF 64%, normal pattern of perfusion in all regions, no scintigraphic evidence of inducible ischemia; no significant wall motion abnormalities; EKG negative for ischemia; no significant change from last study; low risk scan  . TRANSTHORACIC ECHOCARDIOGRAM  07/2018   Normal EF 55-60%.  No RWMA. GR 1 DD. Ao Sclerosis - no Stenosis. Mild AI. Trivial MR.      OB History   No obstetric history on file.      Home Medications    Prior to Admission medications   Medication Sig Start Date End Date Taking? Authorizing Provider  acetaminophen (TYLENOL) 325 MG tablet Take 325-650 mg by mouth daily as needed (pain or headaches).    Yes [provider]  amLODipine (NORVASC) 10 MG tablet Take 10 mg by mouth daily.  09/14/17  Yes [provider]  cloNIDine (CATAPRES) 0.1 MG tablet May take a tablet as needed for blood pressure greater than sytolic 474. Patient taking differently: Take 0.1 mg by mouth daily as needed (IF SB/P >170).  08/03/17  Yes Leonie Man, MD  ELIQUIS 5 MG  TABS tablet Take 1 tablet by mouth twice daily Patient taking differently: Take 5 mg by mouth 2 (two) times daily.  05/02/19  Yes Jodelle GrossLawrence, Kathryn M, NP  ENSURE (ENSURE) Take 237 mLs by mouth daily.   Yes [provider]  flecainide (TAMBOCOR) 50 MG tablet Take 1 tablet (50 mg total) by mouth 2 (two) times daily. 08/10/18  Yes Creig HinesBerge, Christopher Ronald, NP  fluticasone (FLONASE) 50 MCG/ACT nasal spray USE TWO SPRAY(S) IN EACH NOSTRIL ONCE DAILY Patient  taking differently: Place 2 sprays into both nostrils daily as needed for allergies.  01/05/17  Yes Shade FloodGreene, Jeffrey R, MD  lisinopril (ZESTRIL) 20 MG tablet Take 1 tablet by mouth once daily Patient taking differently: Take 20 mg by mouth daily.  03/30/19  Yes Marykay LexHarding, David W, MD  metoprolol succinate (TOPROL-XL) 50 MG 24 hr tablet Take 50 mg by mouth daily after lunch.  05/01/19  Yes [provider]  Multiple Vitamins-Minerals (CENTRUM SILVER 50+WOMEN) TABS Take 1 tablet by mouth daily with lunch.   Yes [provider]  omeprazole (PRILOSEC) 20 MG capsule Take 1 capsule (20 mg total) by mouth 2 (two) times daily before a meal. Take before breakfast. Patient taking differently: Take 20 mg by mouth daily after breakfast.  11/12/17  Yes Ofilia Neaslark, Michael L, PA-C  sucralfate (CARAFATE) 1 GM/10ML suspension Take 10 mLs (1 g total) by mouth 4 (four) times daily -  with meals and at bedtime. Patient taking differently: Take 1 g by mouth daily as needed (when eating gas-producing foods).  01/05/18  Yes Shade FloodGreene, Jeffrey R, MD  atorvastatin (LIPITOR) 20 MG tablet Take 1 tablet (20 mg total) by mouth at bedtime. Patient not taking: Reported on 05/11/2019 02/01/18 07/16/26  Marykay LexHarding, David W, MD  Blood Pressure Monitoring (BLOOD PRESSURE CUFF) MISC Take your  blood pressure with blood pressure cuff/moniotor- can be automatic or manual - one to three  times a week as needed 04/20/19   Marykay LexHarding, David W, MD    Family History Family History  Problem Relation Age of Onset  . Arthritis Mother        OSTEO  . Hypertension Mother   . Dementia Mother   . Heart disease Father   . Hypertension Sister   . Dementia Sister   . Cancer - Lung Brother   . Cancer Maternal Grandmother   . Heart disease Maternal Grandfather   . Cancer - Lung Brother   . Hypertension Brother   . Cancer - Prostate Brother     Social History Social History   Tobacco Use  . Smoking status: Never Smoker  . Smokeless tobacco:  Never Used  Substance Use Topics  . Alcohol use: No    Alcohol/week: 0.0 standard drinks  . Drug use: No     Allergies   Antihistamines, chlorpheniramine-type   Review of Systems Review of Systems  Constitutional: Negative for fatigue and fever.  HENT: Negative for congestion and trouble swallowing.   Eyes: Negative for visual disturbance.  Respiratory: Negative for cough.   Cardiovascular: Positive for chest pain and palpitations. Negative for leg swelling.  Gastrointestinal: Negative for abdominal pain, constipation, diarrhea and vomiting.  Genitourinary: Negative for dysuria.  Musculoskeletal: Negative for gait problem.  Skin: Negative for rash.  Neurological: Negative for seizures, syncope, weakness, light-headedness, numbness and headaches.  Psychiatric/Behavioral: Negative for confusion.     Physical Exam Updated Vital Signs BP (!) 153/81 (BP Location: Right Arm)   Pulse 66   Temp 98.1 F (  36.7 C) (Oral)   Resp 14   Ht 5' (1.524 m)   Wt 49.4 kg   SpO2 99%   BMI 21.29 kg/m   Physical Exam Constitutional:      General: She is not in acute distress.    Appearance: She is not diaphoretic.  HENT:     Head: Normocephalic and atraumatic.     Nose: Nose normal.     Mouth/Throat:     Mouth: Mucous membranes are moist.     Pharynx: Oropharynx is clear.  Eyes:     Pupils: Pupils are equal, round, and reactive to light.  Neck:     Musculoskeletal: Neck supple.  Cardiovascular:     Rate and Rhythm: Normal rate and regular rhythm.     Pulses: Normal pulses.  Pulmonary:     Effort: Pulmonary effort is normal. No respiratory distress.     Breath sounds: Normal breath sounds. No wheezing, rhonchi or rales.  Chest:     Chest wall: No tenderness.  Abdominal:     Palpations: Abdomen is soft.     Tenderness: There is no abdominal tenderness. There is no guarding or rebound.  Musculoskeletal:     Right lower leg: No edema.     Left lower leg: No edema.  Skin:     General: Skin is warm and dry.  Neurological:     General: No focal deficit present.     Mental Status: She is alert and oriented to person, place, and time.     Cranial Nerves: No cranial nerve deficit.     Sensory: No sensory deficit.     Motor: No weakness.     Coordination: Coordination normal.      ED Treatments / Results  Labs (all labs ordered are listed, but only abnormal results are displayed) Labs Reviewed  BASIC METABOLIC PANEL - Abnormal; Notable for the following components:      Result Value   BUN 5 (*)    Calcium 8.8 (*)    All other components within normal limits  CBC - Abnormal; Notable for the following components:   Hemoglobin 10.9 (*)    HCT 35.4 (*)    MCH 25.5 (*)    All other components within normal limits  TROPONIN I (HIGH SENSITIVITY)  TROPONIN I (HIGH SENSITIVITY)    EKG EKG Interpretation  Date/Time:  Wednesday May 11 2019 21:02:10 EDT Ventricular Rate:  64 PR Interval:    QRS Duration: 98 QT Interval:  426 QTC Calculation: 440 R Axis:   21 Text Interpretation:  Sinus or ectopic atrial rhythm Short PR interval Probable left ventricular hypertrophy No significant change since last tracing Confirmed by Melene PlanFloyd, Dan [phone]([id number]) on 05/11/2019 9:38:38 PM   Radiology Dg Chest Portable 1 View  Result Date: 05/11/2019 CLINICAL DATA:  Chest pain EXAM: PORTABLE CHEST 1 VIEW COMPARISON:  10/27/2018 FINDINGS: Heart and mediastinal contours are within normal limits. No focal opacities or effusions. No acute bony abnormality. IMPRESSION: No active disease. Electronically Signed   By: Charlett NoseKevin  Dover M.D.   On: 05/11/2019 21:21    Procedures Procedures (including critical care time)  Medications Ordered in ED Medications  sodium chloride flush (NS) 0.9 % injection 3 mL (has no administration in time range)     Initial Impression / Assessment and Plan / ED Course  I have reviewed the triage vital signs and the nursing notes.  Pertinent labs &  imaging results that were available during my care of the patient  were reviewed by me and considered in my medical decision making (see chart for details).    Concern for possible ACS. EKG shows sinus rhythm, patient may have had episode of a. Fib which self converted. Low suspicion for PE, no shortness of breath, patient compliant with anticoagulation.  HEAR Score: 5. Initial troponin negative. Patient given ASA by EMS.  Patient care transferred to oncoming provider at approximately 11:30 PM, plan to follow up delta troponin.  Patient seen and plan discussed with Dr. Adela Lank.  Final Clinical Impressions(s) / ED Diagnoses   Final diagnoses:  None    ED Discharge Orders    None       Ignacia Palma, MD 05/12/19 0055    Melene Plan, DO 05/14/19 [phone]

## 2019-05-11 NOTE — ED Triage Notes (Signed)
Per [facility] pt from home, reports heart palpitations while moving furniture around associated with mid-sternum chest pressure, 2/10 pain, nitro x1 given, pt reports she is pain free.

## 2019-05-12 LAB — TROPONIN I (HIGH SENSITIVITY): Troponin I (High Sensitivity): 6 ng/L (ref <18)

## 2019-05-12 NOTE — ED Notes (Signed)
Pt ambulated to restroom. 

## 2019-05-12 NOTE — ED Provider Notes (Signed)
I assumed care of this patient from Dr. Joylene Draft and Tyrone Nine.  Please see their note for further details of Hx, PE.  Briefly patient is a 79 y.o. female who presented with palpitations and chest discomfort/ reassuring EKG and intial trop.   Current plan is to obtain delta trop. If negative, safe for DC.  Delta trop negative.  Asymptomatic.   The patient appears reasonably screened and/or stabilized for discharge and I doubt any other medical condition or other [facility] requiring further screening, evaluation, or treatment in the ED at this time prior to discharge.  The patient is safe for discharge with strict return precautions.   The patient appears reasonably screened and/or stabilized for discharge and I doubt any other medical condition or other [facility] requiring further screening, evaluation, or treatment in the ED at this time prior to discharge.  Disposition: Discharge  Condition: Good  I have discussed the results, Dx and Tx plan with the patient who expressed understanding and agree(s) with the plan. Discharge instructions discussed at great length. The patient was given strict return precautions who verbalized understanding of the instructions. No further questions at time of discharge.    ED Discharge Orders    None        Follow Up: Leonie Man, [address] [place] [address] [place] [address] [phone]  Schedule an appointment as soon as possible for a visit  As needed        Fatima Blank, MD 05/12/19 [phone]

## 2019-05-17 ENCOUNTER — Other Ambulatory Visit: Payer: Self-pay

## 2019-05-17 ENCOUNTER — Encounter: Payer: Self-pay | Admitting: *Deleted

## 2019-05-17 ENCOUNTER — Encounter: Payer: Self-pay | Admitting: Physician Assistant

## 2019-05-17 ENCOUNTER — Ambulatory Visit (INDEPENDENT_AMBULATORY_CARE_PROVIDER_SITE_OTHER): Payer: Medicare Other | Admitting: Physician Assistant

## 2019-05-17 ENCOUNTER — Other Ambulatory Visit: Payer: Self-pay | Admitting: Physician Assistant

## 2019-05-17 VITALS — BP 164/97 | HR 70 | Ht 64.0 in | Wt 110.4 lb

## 2019-05-17 DIAGNOSIS — I480 Paroxysmal atrial fibrillation: Secondary | ICD-10-CM

## 2019-05-17 DIAGNOSIS — R072 Precordial pain: Secondary | ICD-10-CM | POA: Diagnosis not present

## 2019-05-17 DIAGNOSIS — I10 Essential (primary) hypertension: Secondary | ICD-10-CM

## 2019-05-17 MED ORDER — METOPROLOL TARTRATE 100 MG PO TABS: 100.0000 mg | ORAL_TABLET | Freq: Once | ORAL | 0 refills | Status: DC

## 2019-05-17 NOTE — Progress Notes (Signed)
Cardiology Office Note   Date:  05/17/2019   ID:  Tovah, Slavick September 15, 1940, MRN [id number]  PCP:  Horald Pollen, MD Cardiologist:  Glenetta Hew, MD 09/19/2018 Rosaria Ferries, PA-C    History of Present Illness: Donna Simmons is a 78 y.o. female with a history of PAF on Eliquis, PACs, HTN, HLD, GERD  ER visit 09/10 for palpitations>>appt made  Donna Simmons presents for cardiology follow up from the ER.  When she was having the palpitations, her heart was going very fast. She is compliant with the Toprol XL, but it took it longer to work.  She did not get an exact heart rate.  She had chest pain, pressure, with the palpitations. It reached a 6/10. She has never had that before. When the palpitations stopped, so did the pain. The pain is one of the reasons she went to the ER.   She checks her BP at home, generally 2 x day. SBP is usually in the 130s when checked. She has had meds today, BP is unusually high for her here, but was not that high at home today.   She and her husband still run track. They may only run once a week, but do yard work and walking for additional exercise.  Her family has a 100 acre farm in [place], they garden and do work there as well.   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 She feels they have overworked themselves doing this.  She feels a little tired, but with the running and other activities, has not had the chest pressure that she had during the palpitations.  No bleeding issues on the Eliquis.   Past Medical History:  Diagnosis Date  . Allergy   . GERD (gastroesophageal reflux disease)   . Heart murmur    No significant valvular lesion noted on echo.  . Hypercholesteremia   . Hypertension   . Paroxysmal atrial fibrillation ([place]) 12/27/2008    Past Surgical History:  Procedure Laterality Date  . ABDOMINAL HYSTERECTOMY  1980  . BREAST SURGERY    . CARDIAC  CATHETERIZATION  12/16/2010   no evidence of CAD to explain anginal pain w/ positive troponin.  potential etiology is breakthrough AF  . EYE SURGERY Right 12/29/2016  . EYE SURGERY Left 01/19/2017  . FRACTURE SURGERY    . NM MYOVIEW LTD  April 2010   EF 64%, normal pattern of perfusion in all regions, no scintigraphic evidence of inducible ischemia; no significant wall motion abnormalities; EKG negative for ischemia; no significant change from last study; low risk scan  . TRANSTHORACIC ECHOCARDIOGRAM  07/2018   Normal EF 55-60%.  No RWMA. GR 1 DD. Ao Sclerosis - no Stenosis. Mild AI. Trivial MR.     Current Outpatient Medications  Medication Sig Dispense Refill  . acetaminophen (TYLENOL) 325 MG tablet Take 325-650 mg by mouth daily as needed (pain or headaches).     Marland Kitchen amLODipine (NORVASC) 10 MG tablet Take 10 mg by mouth daily.     Marland Kitchen atorvastatin (LIPITOR) 20 MG tablet Take 1 tablet (20 mg total) by mouth at bedtime. 90 tablet 3  . Blood Pressure Monitoring (BLOOD PRESSURE CUFF) MISC Take your  blood pressure with blood pressure cuff/moniotor- can be automatic or manual - one to three  times a week as needed 1 each 0  . ELIQUIS 5 MG TABS tablet Take 1 tablet by mouth twice  daily (Patient taking differently: Take 5 mg by mouth 2 (two) times daily. ) 180 tablet 0  . ENSURE (ENSURE) Take 237 mLs by mouth daily.    . flecainide (TAMBOCOR) 50 MG tablet Take 1 tablet (50 mg total) by mouth 2 (two) times daily. 180 tablet 3  . fluticasone (FLONASE) 50 MCG/ACT nasal spray USE TWO SPRAY(S) IN EACH NOSTRIL ONCE DAILY (Patient taking differently: Place 2 sprays into both nostrils daily as needed for allergies. ) 16 g 1  . lisinopril (ZESTRIL) 20 MG tablet Take 1 tablet by mouth once daily (Patient taking differently: Take 20 mg by mouth daily. ) 90 tablet 1  . metoprolol succinate (TOPROL-XL) 50 MG 24 hr tablet Take 50 mg by mouth daily after lunch.     . Multiple Vitamins-Minerals (CENTRUM SILVER  50+WOMEN) TABS Take 1 tablet by mouth daily with lunch.    . sucralfate (CARAFATE) 1 GM/10ML suspension Take 10 mLs (1 g total) by mouth 4 (four) times daily -  with meals and at bedtime. (Patient taking differently: Take 1 g by mouth daily as needed (when eating gas-producing foods). ) 420 mL 0   No current facility-administered medications for this visit.     Allergies:   Antihistamines, chlorpheniramine-type    Social History:  The patient  reports that she has never smoked. She has never used smokeless tobacco. She reports that she does not drink alcohol or use drugs.   Family History:  The patient's family history includes Arthritis in her mother; Cancer in her maternal grandmother; Cancer - Lung in her brother and brother; Cancer - Prostate in her brother; Dementia in her mother and sister; Heart disease in her father and maternal grandfather; Hypertension in her brother, mother, and sister.  She indicated that her mother is deceased. She indicated that her father is deceased. She indicated that her sister is deceased. She indicated that five of her seven brothers are alive. She indicated that her maternal grandmother is deceased. She indicated that her maternal grandfather is deceased.   ROS:  Please see the history of present illness. All other systems are reviewed and negative.   PHYSICAL EXAM: VS:  BP (!) 164/97   Pulse 70   Ht 5\' 4"  (1.626 m)   Wt 110 lb 6.4 oz (50.1 kg)   BMI 18.95 kg/m  , BMI Body mass index is 18.95 kg/m. GEN: Well nourished, slender elderly female in no acute distress HEENT: normal for age  Neck: no JVD, no carotid bruit, no masses Cardiac: RRR; no murmur, no rubs, or gallops Respiratory:  clear to auscultation bilaterally, normal work of breathing GI: soft, nontender, nondistended, + BS MS: no deformity or atrophy; no edema; distal pulses are 2+ in all 4 extremities  Skin: warm and dry, no rash Neuro:  Strength and sensation are intact Psych:  euthymic mood, full affect   EKG:  EKG is not ordered today. The ECG 09/09 from the ER, is SR, HR 64, no acute ischemic changes  ECHO: 07/25/2018 - Left ventricle: The cavity size was normal. Wall thickness was   normal. Systolic function was normal. The estimated ejection   fraction was in the range of 55% to 60%. Wall motion was normal;   there were no regional wall motion abnormalities. Doppler   parameters are consistent with abnormal left ventricular   relaxation (grade 1 diastolic dysfunction). The E/e&' ratio is   >15, suggesting elevated LV filling pressure. - Aortic valve: Trileaflet. Sclerosis without stenosis. There  was   mild regurgitation. - Mitral valve: Mildly thickened leaflets . There was trivial   regurgitation. - Left atrium: The atrium was normal in size. - Tricuspid valve: There was trivial regurgitation. - Pulmonary arteries: PA peak pressure: 19 mm Hg (S). - Inferior vena cava: The vessel was normal in size. The   respirophasic diameter changes were in the normal range (>= 50%),   consistent with normal central venous pressure. Impressions: - LVEF 55-60%, normal wall thickness, normal wall motion, grade 1   DD, elevated LV filling pressure, aortic sclerosis with mild AI,   trivial MR, normal LA size, trivial TR, RVSP 19 mmHg, normal IVC.  MYOVIEW: 12/27/2008 No scar or ischemia, EF 64%   Recent Labs: 04/06/2019: ALT 12 05/11/2019: BUN 5; Creatinine, Ser 0.93; Hemoglobin 10.9; Platelets 201; Potassium 3.7; Sodium 137  CBC    Component Value Date/Time   WBC 4.3 05/11/2019 2100   RBC 4.27 05/11/2019 2100   HGB 10.9 (L) 05/11/2019 2100   HGB 12.0 04/06/2019 1106   HGB 11.8 04/20/2017 1108   HCT 35.4 (L) 05/11/2019 2100   HCT 38.2 04/06/2019 1106   HCT 35.8 04/20/2017 1108   PLT 201 05/11/2019 2100   PLT 214 04/06/2019 1106   MCV 82.9 05/11/2019 2100   MCV 83 04/06/2019 1106   MCV 80 (L) 04/20/2017 1108   MCH 25.5 (L) 05/11/2019 2100   MCHC 30.8  05/11/2019 2100   RDW 14.9 05/11/2019 2100   RDW 14.3 04/06/2019 1106   RDW 14.9 04/20/2017 1108   LYMPHSABS 1.0 04/06/2019 1106   LYMPHSABS 1.2 04/20/2017 1108   MONOABS 0.3 10/27/2018 2347   EOSABS 0.0 04/06/2019 1106   EOSABS 0.0 04/20/2017 1108   BASOSABS 0.0 04/06/2019 1106   BASOSABS 0.0 04/20/2017 1108   CMP Latest Ref Rng & Units 05/11/2019 04/06/2019 10/27/2018  Glucose 70 - 99 mg/dL 91 92 97  BUN 8 - 23 mg/dL 5(L) 18 12  Creatinine 0.44 - 1.00 mg/dL 7.62 8.31(D) 1.76  Sodium 135 - 145 mmol/L 137 142 137  Potassium 3.5 - 5.1 mmol/L 3.7 4.5 3.5  Chloride 98 - 111 mmol/L 104 102 104  CO2 22 - 32 mmol/L 24 26 24   Calcium 8.9 - 10.3 mg/dL 1.6(W) 9.9 9.0  Total Protein 6.0 - 8.5 g/dL - 7.2 -  Total Bilirubin 0.0 - 1.2 mg/dL - 0.4 -  Alkaline Phos 39 - 117 IU/L - 59 -  AST 0 - 40 IU/L - 19 -  ALT 0 - 32 IU/L - 12 -     Lipid Panel Lab Results  Component Value Date   CHOL 243 (H) 04/06/2019   HDL 84 04/06/2019   LDLCALC 141 (H) 04/06/2019   TRIG 90 04/06/2019   CHOLHDL 2.9 04/06/2019      Wt Readings from Last 3 Encounters:  05/17/19 110 lb 6.4 oz (50.1 kg)  05/11/19 109 lb (49.4 kg)  04/12/19 109 lb (49.4 kg)     Other studies Reviewed: Additional studies/ records that were reviewed today include: Office notes, hospital records and testing.  ASSESSMENT AND PLAN:  1.  Chest pain: - She had symptoms when her heart rate was very high because of what was most likely another episode of atrial fib - She has had exertional symptoms previously, a cardiac CT had been ordered in the past but never performed - I discussed this with the patient, we will go ahead and schedule the CT  2.  PAF: - She had  palpitations the other night, they took longer than usual to go away. -However, she does not have palpitations frequently - Let us know if she gets more palpitations with symptoms - Reassured her that her heart rate is fine now and the Eliquis is her stroke  prevention.  3.  Hypertension: - She has had her medications today. -She takes her blood pressure twice a day and it is generally well controlled, no recent readings greater than 140 systolic - She feels the stress of getting here in dealing with the pandemic in the process have run her blood pressure up today -Continue to follow blood pressure readings at home and report if they are not well controlled   Current medicines are reviewed at length with the patient today.  The patient does not have concerns regarding medicines.  The following changes have been made:  no change  Labs/ tests ordered today include:  No orders of the defined types were placed in this encounter.    Disposition:   FU with Bryan Lemmaavid Harding, MD  Signed, Theodore Demarkhonda Tayllor Breitenstein, PA-C  05/17/2019 12:30 PM    [facility] Phone: [phone]; Fax: [phone]

## 2019-05-17 NOTE — Patient Instructions (Addendum)
Your cardiac CT will be scheduled at one of the below locations:   [facility] [address] [place], [place] [address] (336) [place] [address] [place], [place] [address] [phone]  If scheduled at [facility], please arrive at the [facility] main entrance of [facility] 30-45 minutes prior to test start time. Proceed to the [facility] Radiology Department (first floor) to check-in and test prep.  If scheduled at [facility], please arrive 15 mins early for check-in and test prep.  Please follow these instructions carefully (unless otherwise directed):   On the Night Before the Test: . Be sure to Drink plenty of water. . Do not consume any caffeinated/decaffeinated beverages or chocolate 12 hours prior to your test.  On the Day of the Test: . Drink plenty of water. Do not drink any water within one hour of the test. . Do not eat any food 4 hours prior to the test. . You may take your regular medications prior to the test.  . Take metoprolol (Lopressor) two hours prior to test. . HOLD Furosemide/Hydrochlorothiazide morning of the test. . FEMALES- please wear underwire-free bra if available       After the Test: . Drink plenty of water. . After receiving IV contrast, you may experience a mild flushed feeling. This is normal. . On occasion, you may experience a mild rash up to 24 hours after the test. This is not dangerous. If this occurs, you can take Benadryl 25 mg and increase your fluid intake. . If you experience trouble breathing, this can be serious. If it is severe call 911 IMMEDIATELY. If it is mild, please call our office. . If you take any of these medications: Glipizide/Metformin, Avandament, Glucavance, please do not take 48 hours after completing test unless otherwise instructed.    Please contact the cardiac imaging nurse navigator  should you have any questions/concerns Marchia Bond, RN Navigator [facility] [phone] Office  [phone] Cell

## 2019-06-06 ENCOUNTER — Ambulatory Visit (INDEPENDENT_AMBULATORY_CARE_PROVIDER_SITE_OTHER): Payer: Medicare Other | Admitting: Emergency Medicine

## 2019-06-06 ENCOUNTER — Other Ambulatory Visit: Payer: Self-pay

## 2019-06-06 DIAGNOSIS — Z23 Encounter for immunization: Secondary | ICD-10-CM

## 2019-06-06 NOTE — Progress Notes (Signed)
NURSE VISIT ONLY

## 2019-06-14 ENCOUNTER — Telehealth (HOSPITAL_COMMUNITY): Payer: Self-pay | Admitting: Emergency Medicine

## 2019-06-14 NOTE — Telephone Encounter (Signed)
Reaching out to patient to offer assistance regarding upcoming cardiac imaging study; pt verbalizes understanding of appt date/time, parking situation and where to check in, pre-test NPO status and medications ordered, and verified current allergies; name and call back number provided for further questions should they arise Donna Bond RN [facility] [phone] office [phone] cell   Pt states she will have a ride tomorrow for test

## 2019-06-15 ENCOUNTER — Ambulatory Visit (HOSPITAL_COMMUNITY)
Admission: RE | Admit: 2019-06-15 | Discharge: 2019-06-15 | Disposition: A | Discharge status: Home / Self Care | Payer: Medicare Other | Source: Ambulatory Visit | Attending: Physician Assistant | Admitting: Physician Assistant

## 2019-06-15 ENCOUNTER — Encounter (HOSPITAL_COMMUNITY): Payer: Self-pay

## 2019-06-15 ENCOUNTER — Other Ambulatory Visit: Payer: Self-pay

## 2019-06-15 DIAGNOSIS — I480 Paroxysmal atrial fibrillation: Secondary | ICD-10-CM | POA: Diagnosis present

## 2019-06-15 DIAGNOSIS — R072 Precordial pain: Secondary | ICD-10-CM | POA: Insufficient documentation

## 2019-06-15 MED ORDER — NITROGLYCERIN 0.4 MG SL SUBL
0.8000 mg | SUBLINGUAL_TABLET | Freq: Once | SUBLINGUAL | Status: AC
  Administered 2019-06-15: 15:00:00 0.8 mg via SUBLINGUAL

## 2019-06-15 MED ORDER — IOHEXOL 350 MG/ML SOLN
100.0000 mL | Freq: Once | INTRAVENOUS | Status: AC | PRN
  Administered 2019-06-15: 16:00:00 100 mL via INTRAVENOUS

## 2019-06-15 MED ORDER — NITROGLYCERIN 0.4 MG SL SUBL
SUBLINGUAL_TABLET | SUBLINGUAL | Status: AC
  Filled 2019-06-15: qty 2

## 2019-06-15 NOTE — Progress Notes (Signed)
Pt tolerated exam without incident.  PIV removed, dressing applied.  Pt provided with snack and beverage.  Discharge instructions discussed, all questions answered.  Pt discharged

## 2019-06-15 NOTE — Discharge Instructions (Signed)
Testing With IV Contrast Material °IV contrast material is a fluid that is used with some imaging tests. It is injected into your body through a vein. Contrast material is used when your health care providers need a detailed look at organs, tissues, or blood vessels that may not show up with the standard test. The material may be used when an X-ray, an MRI, a CT scan, or an ultrasound is done. °IV contrast material may be used for imaging tests that check: °· Muscles, skin, and fat. °· Breasts. °· Brain. °· Digestive tract. °· Heart. °· Organs such as the liver, kidneys, lungs, bladder, and many others. °· Arteries and veins. °Tell a health care provider about: °· Any allergies you have, especially an allergy to contrast material. °· All medicines you are taking, including metformin, beta blockers, NSAIDs (such as ibuprofen), interleukin-2, vitamins, herbs, eye drops, creams, and over-the-counter medicines. °· Any problems you or family members have had with the use of contrast material. °· Any blood disorders you have, such as sickle cell anemia. °· Any surgeries you have had. °· Any medical conditions you have or have had, especially alcohol abuse, dehydration, asthma, or kidney, liver, or heart problems. °· Whether you are pregnant or may be pregnant. °· Whether you are breastfeeding. Most contrast materials are safe for use in breastfeeding women. °What are the risks? °Generally, this is a safe procedure. However, problems may occur, including: °· Headache. °· Itching, skin rash, and hives. °· Nausea and vomiting. °· Allergic reactions. °· Wheezing or difficulty breathing. °· Abnormal heart rate. °· Changes in blood pressure. °· Throat swelling. °· Kidney damage. °What happens before the procedure? °Medicines °Ask your health care provider about: °· Changing or stopping your regular medicines. This is especially important if you are taking diabetes medicines or blood thinners. °· Taking medicines such as aspirin  and ibuprofen. These medicines can thin your blood. Do not take these medicines unless your health care provider tells you to take them. °· Taking over-the-counter medicines, vitamins, herbs, and supplements. °If you are at risk of having a reaction to the IV contrast material, you may be asked to take medicine before the procedure to prevent a reaction. °General instructions °· Follow instructions from your health care provider about eating or drinking restrictions. °· You may have an exam or lab tests to make sure that you can safely get IV contrast material. °· Ask if you will be given a medicine to help you relax (sedative) during the procedure. If so, plan to have someone take you home from the hospital or clinic. °What happens during the procedure? °· You may be given a sedative to help you relax. °· An IV will be inserted into one of your veins. °· Contrast material will be injected into your IV. °· You may feel warmth or flushing as the contrast material enters your bloodstream. °· You may have a metallic taste in your mouth for a few minutes. °· The needle may cause some discomfort and bruising. °· After the contrast material is in your body, the imaging test will be done. °The procedure may vary among health care providers and hospitals. °What can I expect after the procedure? °· The IV will be removed. °· You may be taken to a recovery area if sedation medicines were used. Your blood pressure, heart rate, breathing rate, and blood oxygen level will be monitored until you leave the hospital or clinic. °Follow these instructions at home: ° °· Take over-the-counter and   prescription medicines only as told by your health care provider. °? Your health care provider may tell you to not take certain medicines for a couple of days after the procedure. This is especially important if you are taking diabetes medicines. °· If you are told, drink enough fluid to keep your urine pale yellow. This will help to remove  the contrast material out of your body. °· Do not drive for 24 hours if you were given a sedative during your procedure. °· It is up to you to get the results of your procedure. Ask your health care provider, or the department that is doing the procedure, when your results will be ready. °· Keep all follow-up visits as told by your health care provider. This is important. °Contact a health care provider if: °· You have redness, swelling, or pain near your IV site. °Get help right away if: °· You have an abnormal heart rhythm. °· You have trouble breathing. °· You have: °? Chest pain. °? Pain in your back, neck, arm, jaw, or stomach. °? Nausea or sweating. °? Hives or a rash. °· You start shaking and cannot stop. °These symptoms may represent a serious problem that is an emergency. Do not wait to see if the symptoms will go away. Get medical help right away. Call your local emergency services (911 in the [place] Do not drive yourself to the hospital. °Summary °· IV contrast material may be used for imaging tests to help your health care providers see your organs and tissues more clearly. °· Tell your health care provider if you are pregnant or may be pregnant. °· During the procedure, you may feel warmth or flushing as the contrast material enters your bloodstream. °· After the procedure, drink enough fluid to keep your urine pale yellow. °This information is not intended to replace advice given to you by your health care provider. Make sure you discuss any questions you have with your health care provider. °Document Released: 08/06/2009 Document Revised: 11/04/2018 Document Reviewed: 11/04/2018 °Elsevier Patient Education © 2020 [employer] ° ° °Cardiac CT Angiogram ° °A cardiac CT angiogram is a procedure to look at the heart and the area around the heart. It may be done to help find the cause of chest pains or other symptoms of heart disease. During this procedure, a large X-ray machine, called a CT scanner,  takes detailed pictures of the heart and the surrounding area after a dye (contrast material) has been injected into blood vessels in the area. The procedure is also sometimes called a coronary CT angiogram, coronary artery scanning, or CTA. °A cardiac CT angiogram allows the health care provider to see how well blood is flowing to and from the heart. The health care provider will be able to see if there are any problems, such as: °· Blockage or narrowing of the coronary arteries in the heart. °· Fluid around the heart. °· Signs of weakness or disease in the muscles, valves, and tissues of the heart. °Tell a health care provider about: °· Any allergies you have. This is especially important if you have had a previous allergic reaction to contrast dye. °· All medicines you are taking, including vitamins, herbs, eye drops, creams, and over-the-counter medicines. °· Any blood disorders you have. °· Any surgeries you have had. °· Any medical conditions you have. °· Whether you are pregnant or may be pregnant. °· Any anxiety disorders, chronic pain, or other conditions you have that may increase your stress or prevent   you from lying still. °What are the risks? °Generally, this is a safe procedure. However, problems may occur, including: °· Bleeding. °· Infection. °· Allergic reactions to medicines or dyes. °· Damage to other structures or organs. °· Kidney damage from the dye or contrast that is used. °· Increased risk of cancer from radiation exposure. This risk is low. Talk with your health care provider about: °? The risks and benefits of testing. °? How you can receive the lowest dose of radiation. °What happens before the procedure? °· Wear comfortable clothing and remove any jewelry, glasses, dentures, and hearing aids. °· Follow instructions from your health care provider about eating and drinking. This may include: °? For 12 hours before the test -- avoid caffeine. This includes tea, coffee, soda, energy drinks,  and diet pills. Drink plenty of water or other fluids that do not have caffeine in them. Being well-hydrated can prevent complications. °? For 4-6 hours before the test -- stop eating and drinking. The contrast dye can cause nausea, but this is less likely if your stomach is empty. °· Ask your health care provider about changing or stopping your regular medicines. This is especially important if you are taking diabetes medicines, blood thinners, or medicines to treat erectile dysfunction. °What happens during the procedure? °· Hair on your chest may need to be removed so that small sticky patches called electrodes can be placed on your chest. These will transmit information that helps to monitor your heart during the test. °· An IV tube will be inserted into one of your veins. °· You might be given a medicine to control your heart rate during the test. This will help to ensure that good images are obtained. °· You will be asked to lie on an exam table. This table will slide in and out of the CT machine during the procedure. °· Contrast dye will be injected into the IV tube. You might feel warm, or you may get a metallic taste in your mouth. °· You will be given a medicine (nitroglycerin) to relax (dilate) the arteries in your heart. °· The table that you are lying on will move into the CT machine tunnel for the scan. °· The person running the machine will give you instructions while the scans are being done. You may be asked to: °? Keep your arms above your head. °? Hold your breath. °? Stay very still, even if the table is moving. °· When the scanning is complete, you will be moved out of the machine. °· The IV tube will be removed. °The procedure may vary among health care providers and hospitals. °What happens after the procedure? °· You might feel warm, or you may get a metallic taste in your mouth from the contrast dye. °· You may have a headache from the nitroglycerin. °· After the procedure, drink water or  other fluids to wash (flush) the contrast material out of your body. °· Contact a health care provider if you have any symptoms of allergy to the contrast. These symptoms include: °? Shortness of breath. °? Rash or hives. °? A racing heartbeat. °· Most people can return to their normal activities right after the procedure. Ask your health care provider what activities are safe for you. °· It is up to you to get the results of your procedure. Ask your health care provider, or the department that is doing the procedure, when your results will be ready. °Summary °· A cardiac CT angiogram is a procedure to   look at the heart and the area around the heart. It may be done to help find the cause of chest pains or other symptoms of heart disease. °· During this procedure, a large X-ray machine, called a CT scanner, takes detailed pictures of the heart and the surrounding area after a dye (contrast material) has been injected into blood vessels in the area. °· Ask your health care provider about changing or stopping your regular medicines before the procedure. This is especially important if you are taking diabetes medicines, blood thinners, or medicines to treat erectile dysfunction. °· After the procedure, drink water or other fluids to wash (flush) the contrast material out of your body. °This information is not intended to replace advice given to you by your health care provider. Make sure you discuss any questions you have with your health care provider. °Document Released: 07/31/2008 Document Revised: 07/31/2017 Document Reviewed: 07/07/2016 °Elsevier Patient Education © 2020 [employer] ° °

## 2019-06-20 ENCOUNTER — Telehealth: Payer: Self-pay

## 2019-06-20 NOTE — Telephone Encounter (Addendum)
Called patient twice the first time we were disconnected and the second time the patient answered asked me to hold on and never came back to the phone. Will try calling patient again.   ----- Message from Lonn Georgia, PA-C sent at 06/16/2019  2:56 PM EDT ----- Please let her know that they did not see any significant blockages in your vessels.  There was one area that was not well seen. However, her calcium score was only 51% for her age. Dr. Ellyn Hack reviewed the results as well. If she has additional symptoms, we can look at doing a cardiac catheterization. As long as she is able to exercise and run without getting chest pain, I think we can hold off. Thanks

## 2019-06-20 NOTE — Telephone Encounter (Signed)
Created in error This encounter was created in error - please disregard. 

## 2019-06-20 NOTE — Telephone Encounter (Signed)
-----   Message from Lonn Georgia, PA-C sent at 06/16/2019  2:56 PM EDT ----- Please let her know that they did not see any significant blockages in your vessels.  There was one area that was not well seen. However, her calcium score was only 51% for her age. Dr. Ellyn Hack reviewed the results as well. If she has additional symptoms, we can look at doing a cardiac catheterization. As long as she is able to exercise and run without getting chest pain, I think we can hold off. Thanks

## 2019-07-08 ENCOUNTER — Other Ambulatory Visit: Payer: Self-pay

## 2019-07-08 MED ORDER — FLECAINIDE ACETATE 50 MG PO TABS: 50.0000 mg | ORAL_TABLET | Freq: Two times a day (BID) | ORAL | 3 refills | Status: DC

## 2019-07-15 ENCOUNTER — Other Ambulatory Visit: Payer: Self-pay | Admitting: Adult Health

## 2019-07-18 ENCOUNTER — Encounter: Payer: Self-pay | Admitting: Emergency Medicine

## 2019-07-18 ENCOUNTER — Telehealth: Payer: Self-pay | Admitting: Emergency Medicine

## 2019-07-18 NOTE — Telephone Encounter (Signed)
Patient's husband is calling to request referral to [facility]- Neurology. The patient is having memory loss. The spouse is concerned of demetia. Please advise CB- [phone]. When the referral is placed, can the spouse be notified Please.

## 2019-07-21 ENCOUNTER — Other Ambulatory Visit: Payer: Self-pay

## 2019-07-21 DIAGNOSIS — R413 Other amnesia: Secondary | ICD-10-CM

## 2019-07-21 NOTE — Telephone Encounter (Signed)
Ok thanks 

## 2019-07-21 NOTE — Telephone Encounter (Signed)
Send a referral please.  Thanks.

## 2019-07-21 NOTE — Telephone Encounter (Signed)
Referral has been sent.

## 2019-07-21 NOTE — Telephone Encounter (Signed)
Would you like me to send a referral for this pt or would you like her to have an OV first?

## 2019-07-22 NOTE — Telephone Encounter (Signed)
Referral previously entered.

## 2019-07-27 ENCOUNTER — Encounter: Payer: Self-pay | Admitting: Neurology

## 2019-07-27 ENCOUNTER — Other Ambulatory Visit: Payer: Self-pay

## 2019-07-27 ENCOUNTER — Ambulatory Visit: Payer: Medicare Other | Admitting: Neurology

## 2019-07-27 VITALS — BP 156/60 | HR 69 | Temp 97.6°F | Ht 64.0 in | Wt 113.0 lb

## 2019-07-27 DIAGNOSIS — E538 Deficiency of other specified B group vitamins: Secondary | ICD-10-CM | POA: Diagnosis not present

## 2019-07-27 DIAGNOSIS — R413 Other amnesia: Secondary | ICD-10-CM

## 2019-07-27 MED ORDER — MEMANTINE HCL 5 MG PO TABS: ORAL_TABLET | ORAL | 0 refills | Status: DC

## 2019-07-27 NOTE — Patient Instructions (Signed)
We will start Namenda for the memory, look out for dizziness on this medication.

## 2019-07-27 NOTE — Progress Notes (Signed)
Reason for visit: Memory disturbance  Referring physician: Dr. Violet Baldy Donna Simmons is a 78 y.o. female  History of present illness:  Donna Simmons is a 78 year old right-handed black female with a history of a memory disturbance that has been present for at least 2 years.  Around that time, she stopped driving because she was getting lost, her husband had to take over doing the finances because she could not manage this anymore.  As time has gone on, she has had gradual worsening problems with short-term memory.  The patient is having increasing problems with keeping up with her medications even when using a pill dispenser.  The patient no longer cooks much, her husband does most with this.  The patient is sleeping fairly well at night, she has a good energy level during the day.  Her husband claims that she has lost a "considerable amount of weight" over the last 6 months.  The patient herself believes that she is eating fairly well.  The patient has occasionally had sundowning with hallucinations in the evening hours.  The patient will see people about the house, there have been no significant problems with agitation.  The patient reports no other problems such as numbness or weakness of the extremities, she has not had any change in balance, she denies any headaches or dizziness.  Her mother and her sister also had dementia.  Past Medical History:  Diagnosis Date  . Allergy   . GERD (gastroesophageal reflux disease)   . Heart murmur    No significant valvular lesion noted on echo.  . Hypercholesteremia   . Hypertension   . Paroxysmal atrial fibrillation ([place]) 12/27/2008    Past Surgical History:  Procedure Laterality Date  . ABDOMINAL HYSTERECTOMY  1980  . BREAST SURGERY    . CARDIAC CATHETERIZATION  12/16/2010   no evidence of CAD to explain anginal pain w/ positive troponin.  potential etiology is breakthrough AF  . EYE SURGERY Right 12/29/2016  . EYE SURGERY Left 01/19/2017   . FRACTURE SURGERY    . NM MYOVIEW LTD  April 2010   EF 64%, normal pattern of perfusion in all regions, no scintigraphic evidence of inducible ischemia; no significant wall motion abnormalities; EKG negative for ischemia; no significant change from last study; low risk scan  . TRANSTHORACIC ECHOCARDIOGRAM  07/2018   Normal EF 55-60%.  No RWMA. GR 1 DD. Ao Sclerosis - no Stenosis. Mild AI. Trivial MR.     Family History  Problem Relation Age of Onset  . Arthritis Mother        OSTEO  . Hypertension Mother   . Dementia Mother   . Heart disease Father   . Hypertension Sister   . Dementia Sister   . Cancer - Lung Brother   . Cancer Maternal Grandmother   . Heart disease Maternal Grandfather   . Cancer - Lung Brother   . Hypertension Brother   . Cancer - Prostate Brother     Social history:  reports that she has never smoked. She has never used smokeless tobacco. She reports that she does not drink alcohol or use drugs.  Medications:  Prior to Admission medications   Medication Sig Start Date End Date Taking? Authorizing Provider  acetaminophen (TYLENOL) 325 MG tablet Take 325-650 mg by mouth daily as needed (pain or headaches).    Yes [provider]  amLODipine (NORVASC) 10 MG tablet Take 10 mg by mouth daily.  09/14/17  Yes [provider]  apixaban (ELIQUIS) 5 MG TABS tablet Take 1 tablet (5 mg total) by mouth 2 (two) times daily. 07/15/19  Yes Marykay LexHarding, David W, MD  atorvastatin (LIPITOR) 20 MG tablet Take 1 tablet (20 mg total) by mouth at bedtime. 02/01/18 07/16/26 Yes Marykay LexHarding, David W, MD  Blood Pressure Monitoring (BLOOD PRESSURE CUFF) MISC Take your  blood pressure with blood pressure cuff/moniotor- can be automatic or manual - one to three  times a week as needed 04/20/19  Yes Marykay LexHarding, David W, MD  ENSURE (ENSURE) Take 237 mLs by mouth daily.   Yes [provider]  flecainide (TAMBOCOR) 50 MG tablet Take 1 tablet (50 mg total) by mouth 2 (two) times  daily. 07/08/19  Yes Marykay LexHarding, David W, MD  fluticasone [facility](FLONASE) 50 MCG/ACT nasal spray USE TWO SPRAY(S) IN EACH NOSTRIL ONCE DAILY Patient taking differently: Place 2 sprays into both nostrils daily as needed for allergies.  01/05/17  Yes Shade FloodGreene, Jeffrey R, MD  lisinopril (ZESTRIL) 20 MG tablet Take 1 tablet by mouth once daily Patient taking differently: Take 20 mg by mouth daily.  03/30/19  Yes Marykay LexHarding, David W, MD  metoprolol succinate (TOPROL-XL) 50 MG 24 hr tablet Take 50 mg by mouth daily after lunch.  05/01/19  Yes [provider]  Multiple Vitamins-Minerals (CENTRUM SILVER 50+WOMEN) TABS Take 1 tablet by mouth daily with lunch.   Yes [provider]  sucralfate (CARAFATE) 1 GM/10ML suspension Take 10 mLs (1 g total) by mouth 4 (four) times daily -  with meals and at bedtime. Patient taking differently: Take 1 g by mouth daily as needed (when eating gas-producing foods).  01/05/18  Yes Shade FloodGreene, Jeffrey R, MD  metoprolol tartrate (LOPRESSOR) 100 MG tablet Take 1 tablet (100 mg total) by mouth once for 1 dose. Two hours prior to your CTA. 05/17/19 05/17/19  Barrett, Joline Salthonda G, PA-C      Allergies  Allergen Reactions  . Antihistamines, Chlorpheniramine-Type Palpitations    Makes heart beat fast    ROS:  Out of a complete 14 system review of symptoms, the patient complains only of the following symptoms, and all other reviewed systems are negative.  Memory problems Hallucinations, confusion  Blood pressure (!) 156/60, pulse 69, temperature 97.6 F (36.4 C), height 5\' 4"  (1.626 m), weight 113 lb (51.3 kg).  Physical Exam  General: The patient is alert and cooperative at the time of the examination.  Eyes: Pupils are equal, round, and reactive to light. Discs are flat bilaterally.  Neck: The neck is supple, no carotid bruits are noted.  Respiratory: The respiratory examination is clear.  Cardiovascular: The cardiovascular examination reveals a regular rate and rhythm,  no obvious murmurs or rubs are noted.  Skin: Extremities are without significant edema.  Neurologic Exam  Mental status: The patient is alert and oriented x 2 at the time of the examination (not oriented to date). The Mini-Mental status examination done today shows a total score of 18/30.  Cranial nerves: Facial symmetry is present. There is good sensation of the face to pinprick and soft touch bilaterally. The strength of the facial muscles and the muscles to head turning and shoulder shrug are normal bilaterally. Speech is well enunciated, no aphasia or dysarthria is noted. Extraocular movements are full. Visual fields are full. The tongue is midline, and the patient has symmetric elevation of the soft palate. No obvious hearing deficits are noted.  Motor: The motor testing reveals 5 over 5 strength of all 4  extremities. Good symmetric motor tone is noted throughout.  Sensory: Sensory testing is intact to pinprick, soft touch, vibration sensation, and position sense on all 4 extremities.  Coordination: Cerebellar testing reveals good finger-nose-finger and heel-to-shin bilaterally.  Gait and station: Gait is normal. Tandem gait is slightly unsteady. Romberg is negative. No drift is seen.  Reflexes: Deep tendon reflexes are symmetric and normal bilaterally, with exception of some depression of the ankle jerk reflexes bilaterally. Toes are downgoing bilaterally.   Assessment/Plan:  1.  Progressive memory disturbance, dementia  The patient appears to have a moderate level of dementia at this time, she has had trouble with the memory for least 2 years.  The patient apparently has been losing weight recently, for this reason we will avoid Aricept or Exelon and start with Namenda for the memory.  She will have blood work done today and a CT scan of the brain will be done.  She will follow-up here in 6 months.  The patient is already having some problems with hallucinations and sundowning.  Marlan Palau MD 07/27/2019 10:50 AM  [facility] [address] [place], [place] [address]  Phone [phone] Fax [phone]

## 2019-07-28 LAB — VITAMIN B12: Vitamin B-12: 545 pg/mL (ref 232–1245)

## 2019-07-28 LAB — SEDIMENTATION RATE: Sed Rate: 15 mm/hr (ref 0–40)

## 2019-07-28 LAB — RPR: RPR Ser Ql: NONREACTIVE

## 2019-08-04 ENCOUNTER — Ambulatory Visit (INDEPENDENT_AMBULATORY_CARE_PROVIDER_SITE_OTHER): Payer: Medicare Other | Admitting: Cardiology

## 2019-08-04 ENCOUNTER — Encounter: Payer: Self-pay | Admitting: Cardiology

## 2019-08-04 ENCOUNTER — Other Ambulatory Visit: Payer: Self-pay

## 2019-08-04 VITALS — BP 161/90 | HR 72 | Temp 96.8°F | Ht 64.0 in | Wt 120.2 lb

## 2019-08-04 DIAGNOSIS — E7800 Pure hypercholesterolemia, unspecified: Secondary | ICD-10-CM | POA: Diagnosis not present

## 2019-08-04 DIAGNOSIS — R079 Chest pain, unspecified: Secondary | ICD-10-CM | POA: Diagnosis not present

## 2019-08-04 DIAGNOSIS — I10 Essential (primary) hypertension: Secondary | ICD-10-CM | POA: Diagnosis not present

## 2019-08-04 DIAGNOSIS — Z7901 Long term (current) use of anticoagulants: Secondary | ICD-10-CM

## 2019-08-04 DIAGNOSIS — I480 Paroxysmal atrial fibrillation: Secondary | ICD-10-CM | POA: Diagnosis not present

## 2019-08-04 MED ORDER — LISINOPRIL 20 MG PO TABS: 20.0000 mg | ORAL_TABLET | Freq: Every day | ORAL | 3 refills | Status: DC

## 2019-08-04 MED ORDER — ROSUVASTATIN CALCIUM 20 MG PO TABS: 20.0000 mg | ORAL_TABLET | Freq: Every day | ORAL | 3 refills | Status: DC

## 2019-08-04 NOTE — Progress Notes (Signed)
Primary Care Provider: Horald Pollen, MD Cardiologist: Glenetta Hew, MD Electrophysiologist:   Clinic Note: Chief Complaint  Patient presents with  . Follow-up    Had coronary CTA rechecked  . Atrial Fibrillation    Symptoms with this include chest tightness pressure and dyspnea that sounds like angina    HPI:    Donna Simmons is a 78 y.o. female with a PMH notable for PAF (on Eliquis) with frequent PACs, hypertension and hypercholesterolemia who presents today for 1-monthfollow-up (to review results of CTA).  Recent Hospitalizations:   October 27, 2018: ER for palpitations -> felt as though she was in A. fib during his palpitations.  She took a clonidine to help blood pressure.  Was in sinus rhythm in the ER.   Was seen in follow-up by KJory Sims NP -> noted some short-term memory loss.  Was doing well without any further palpitations.  Had had some blood pressure elevation related to grieving from loss of). ->  Continued on metoprolol and flecainide.  May 11, 2019: ER visit for palpitations and chest heaviness/pressure-worse with exertion, relieved with nitroglycerin. Negative delta troponin.  Reassuring EKG.  Donna TWETENwas last seen on September 15 by RRosaria Ferries PA for ER follow-up.  At that time the patient noted that her Toprol down to work.  She noted that she and her husband still walk on the track very nicely runs).  He also the yard work work on a [address]  No further chest pressure palpitations.  Original plans for cardiac CTA will follow through on and CTA ordered.   Reviewed  CV studies:    The following studies were reviewed today: (if available, images/films reviewed: From Epic Chart or Care Everywhere) . Coronary CTA (06/15/2019): Significant motion artifact which affects interpretation.  Coronary calcium score 26.  Not able to accurately assess for plaque because of motion artifact. o Coronary CTA December 2019: Coronary  calcium score 24.  Nonobstructive CAD with less than 30% plaque in proximal LAD.  Normal aortic root.   Interval History:   Donna BOXLEYis here today with her husband who is assisting with providing history.  She is doing very well still walking on the track, but not as much as she had been because of the restrictions with COVID-19.  She is not having any more episodes of chest tightness or pressure.  In fact she only has the chest discomfort when she has the irregular heartbeats of A. fib.  At baseline no resting exertional chest tightness or pressure.  She does note that her blood pressures have been up and down of late but she has not taken her blood pressure medications yet today.  She denies any heart failure symptoms or any recurrence of A. fib.  No bleeding issues on Eliquis.   CV Review of Symptoms (Summary): no chest pain or dyspnea on exertion negative for - edema, irregular heartbeat, orthopnea, palpitations, paroxysmal nocturnal dyspnea, rapid heart rate, shortness of breath or Syncope/near syncope, TIA/amaurosis fugax; claudication.  She does have mild stable lower extremity edema which goes down when she puts her feet up.  The patient does not have symptoms concerning for COVID-19 infection (fever, chills, cough, or new shortness of breath).  The patient is practicing social distancing. ++ Masking.  Rarely goes out for groceries/shopping.  Usually, her husband will go into the store.   REVIEWED OF SYSTEMS   A comprehensive ROS was performed. Review of Systems  Constitutional:  Negative for malaise/fatigue and weight loss.  HENT: Negative for congestion and nosebleeds.   Respiratory: Negative for cough and shortness of breath.   Gastrointestinal: Negative for blood in stool and melena.  Genitourinary: Negative for hematuria.  Musculoskeletal: Positive for joint pain (Mild arthritis pains).  Neurological: Negative for dizziness (Only if she stands up fast).   Endo/Heme/Allergies: Does not bruise/bleed easily.  Psychiatric/Behavioral: Positive for memory loss. The patient is nervous/anxious (Sometimes if she can remember something). The patient does not have insomnia (Has a hard time falling back asleep when she wakes up to urinate).   All other systems reviewed and are negative.   Anxiety ongoing memory loss issues last 2 years.  Short-term memory issues with sundowning (and reported hallucinations) in the evening hours. Also significant weight loss.-Husband does much the cooking now.   I have reviewed and (if needed) personally updated the patient's problem list, medications, allergies, past medical and surgical history, social and family history.   PAST MEDICAL HISTORY   Past Medical History:  Diagnosis Date  . Allergy   . GERD (gastroesophageal reflux disease)   . Heart murmur    No significant valvular lesion noted on echo.  . Hypercholesteremia   . Hypertension   . Paroxysmal atrial fibrillation ([place]) 12/27/2008    PAST SURGICAL HISTORY   Past Surgical History:  Procedure Laterality Date  . ABDOMINAL HYSTERECTOMY  1980  . BREAST SURGERY    . CT CTA CORONARY W/CA SCORE W/CM &/OR WO/CM  08/2018   Coronary calcium score 24.  Nonobstructive CAD with less than 30% plaque in proximal LAD.  Normal aortic root. ->  Relook in October 2020 had significant motion artifact but coronary calcium score 26.  Marland Kitchen EYE SURGERY Right 12/29/2016  . EYE SURGERY Left 01/19/2017  . FRACTURE SURGERY    . LEFT HEART CATH AND CORONARY ANGIOGRAPHY  12/16/2010   no evidence of CAD to explain anginal pain w/ positive troponin.  potential etiology is breakthrough AF  . NM MYOVIEW LTD  April 2010   EF 64%, normal pattern of perfusion in all regions, no scintigraphic evidence of inducible ischemia; no significant wall motion abnormalities; EKG negative for ischemia; no significant change from last study; low risk scan  . TRANSTHORACIC ECHOCARDIOGRAM  07/2018    Normal EF 55-60%.  No RWMA. GR 1 DD. Ao Sclerosis - no Stenosis. Mild AI. Trivial MR.     MEDICATIONS/ALLERGIES   Current Meds  Medication Sig  . acetaminophen (TYLENOL) 325 MG tablet Take 325-650 mg by mouth daily as needed (pain or headaches).   Marland Kitchen amLODipine (NORVASC) 10 MG tablet Take 10 mg by mouth daily.   Marland Kitchen apixaban (ELIQUIS) 5 MG TABS tablet Take 1 tablet (5 mg total) by mouth 2 (two) times daily.  . Blood Pressure Monitoring (BLOOD PRESSURE CUFF) MISC Take your  blood pressure with blood pressure cuff/moniotor- can be automatic or manual - one to three  times a week as needed  . ENSURE (ENSURE) Take 237 mLs by mouth daily.  . flecainide (TAMBOCOR) 50 MG tablet Take 1 tablet (50 mg total) by mouth 2 (two) times daily.  . fluticasone (FLONASE) 50 MCG/ACT nasal spray USE TWO SPRAY(S) IN EACH NOSTRIL ONCE DAILY (Patient taking differently: Place 2 sprays into both nostrils daily as needed for allergies. )  . lisinopril (ZESTRIL) 20 MG tablet Take 1 tablet (20 mg total) by mouth daily.  . memantine (NAMENDA) 5 MG tablet Take 1 tablet daily for one week, then  take 1 tablet twice daily for one week, then take 1 tablet in the morning and 2 in the evening for one week, then take 2 tablets twice daily  . metoprolol succinate (TOPROL-XL) 50 MG 24 hr tablet Take 50 mg by mouth daily after lunch.   . Multiple Vitamins-Minerals (CENTRUM SILVER 50+WOMEN) TABS Take 1 tablet by mouth daily with lunch.  . sucralfate (CARAFATE) 1 GM/10ML suspension Take 10 mLs (1 g total) by mouth 4 (four) times daily -  with meals and at bedtime. (Patient taking differently: Take 1 g by mouth daily as needed (when eating gas-producing foods). )  . [DISCONTINUED] atorvastatin (LIPITOR) 20 MG tablet Take 1 tablet (20 mg total) by mouth at bedtime.  . [DISCONTINUED] lisinopril (ZESTRIL) 20 MG tablet Take 1 tablet by mouth once daily (Patient taking differently: Take 20 mg by mouth daily. )    Allergies  Allergen Reactions   . Antihistamines, Chlorpheniramine-Type Palpitations    Makes heart beat fast    SOCIAL HISTORY/FAMILY HISTORY   Social History   Tobacco Use  . Smoking status: Never Smoker  . Smokeless tobacco: Never Used  Substance Use Topics  . Alcohol use: No    Alcohol/week: 0.0 standard drinks  . Drug use: No   Social History   Social History Narrative   Married with no children. Walks several times a week at least 3-4 times a week about 2 miles a time. Does not smoke and does not drink.    Family History family history includes Arthritis in her mother; Cancer in her maternal grandmother; Cancer - Lung in her brother and brother; Cancer - Prostate in her brother; Dementia in her mother and sister; Heart disease in her father and maternal grandfather; Hypertension in her brother, mother, and sister.   OBJCTIVE -PE, EKG, labs   Wt Readings from Last 3 Encounters:  08/04/19 120 lb 3.2 oz (54.5 kg)  07/27/19 113 lb (51.3 kg)  05/17/19 110 lb 6.4 oz (50.1 kg)    Physical Exam: BP (!) 161/90   Pulse 72   Temp (!) 96.8 F (36 C)   Ht 5' 4"  (1.626 m)   Wt 120 lb 3.2 oz (54.5 kg)   SpO2 97%   BMI 20.63 kg/m  Physical Exam  Constitutional: She is oriented to person, place, and time. She appears well-developed and well-nourished. No distress.  Healthy-appearing.  Well-groomed  HENT:  Head: Normocephalic and atraumatic.  Neck: Normal range of motion. Neck supple. No hepatojugular reflux and no JVD present. Carotid bruit is not present.  Cardiovascular: Normal rate, regular rhythm, normal heart sounds and intact distal pulses.  No extrasystoles are present. PMI is not displaced. Exam reveals no gallop and no friction rub.  No murmur heard. Pulmonary/Chest: Breath sounds normal. No respiratory distress. She has no wheezes. She has no rales.  Abdominal: Soft. Bowel sounds are normal. She exhibits no distension. There is no abdominal tenderness.  No HSM  Musculoskeletal: Normal range  of motion.        General: Edema present.  Neurological: She is alert and oriented to person, place, and time.  Psychiatric: She has a normal mood and affect. Her behavior is normal.  Somewhat slow to answer questions.  More forgetful than last visit.  Often defers to her husband now  Vitals reviewed.   Adult ECG Report  Rate: 65;  Rhythm: normal sinus rhythm and Normal axis, intervals and durations.;   Narrative Interpretation: Stable EKG  Recent Labs:  Lab Results  Component Value Date   CHOL 243 (H) 04/06/2019   HDL 84 04/06/2019   LDLCALC 141 (H) 04/06/2019   TRIG 90 04/06/2019   CHOLHDL 2.9 04/06/2019   Lab Results  Component Value Date   CREATININE 0.93 05/11/2019   BUN 5 (L) 05/11/2019   NA 137 05/11/2019   K 3.7 05/11/2019   CL 104 05/11/2019   CO2 24 05/11/2019    ASSESSMENT/PLAN    Problem List Items Addressed This Visit    Paroxysmal atrial fibrillation (HCC) - CHA2DS2-VASc Score 3, on Eliquis - Primary (Chronic)    She has now had several breakthrough episodes and a little bit concerned that every time she has been she is very symptomatic.  Doing well on Eliquis.  For the most part well controlled on current dose of Toprol with low-dose flecainide.  Plan: We will have her use an additional dose of flecainide as well as a as needed 50 mg Toprol for breakthrough episodes of A. Fib.  If you have break through  Chest pain or palpations- you may take an extra flecainide tablet  50 mg if you just took your dose  Along with 50 mg of metoprolol Tartrate( lopressor) , if it has been 6 six hours or more since your schedule dose of flecainide - then take 100 mg ( 2 tablets) with Lopressor 50 mg , and the take regular dose of flecainide at the the regular time.       Relevant Medications   lisinopril (ZESTRIL) 20 MG tablet   rosuvastatin (CRESTOR) 20 MG tablet   Other Relevant Orders   EKG 12-Lead   Essential hypertension (Chronic)    Blood pressure is high today,  but she has not yet taken her medicines.  Target systolic range for her should be 125 to 150 mmHg.  Would not want to be too aggressive.      Relevant Medications   lisinopril (ZESTRIL) 20 MG tablet   rosuvastatin (CRESTOR) 20 MG tablet   Hypercholesteremia (Chronic)    Her cholesterol was quite elevated as of August and then she was started on atorvastatin.  I am concerned about her memory issues and would like to see how she does off of the statin altogether.  With relatively normal coronary calcium score and coronary CTA, I do not think we need to be all that aggressive since she is now starting to show signs of dementia.  Recommendations: Stop taking Atorvastatin  For 1 month  Contact office if memory has in improve after one month , if no improvement  Start taking Rosuvastatin 20 mg at bedtime      Relevant Medications   lisinopril (ZESTRIL) 20 MG tablet   rosuvastatin (CRESTOR) 20 MG tablet   Long term current use of anticoagulant therapy (Chronic)    Currently on Eliquis.  Normal renal function and not yet 80, but does have borderline weight.  Were she to have worsening renal function, and when she gets older, would probably need to reduce to 2.5 mg twice daily.      Relevant Orders   EKG 12-Lead   Chest pain with moderate risk for cardiac etiology    When I first met her, she had symptoms very concerning for angina during an episode of A. fib RVR.  She underwent cardiac catheterization at time of the day which did not show any CAD.  She has subsequently had a coronary CTA last December showing 20 calcium score 26 with no significant  disease.  Attempted relook had significant motion artifact, but for the coronary cath score still 24 unlikely there is been progression of disease.  I think her symptoms of A. fib are similar to what 1 would consider angina.  I do not think she needs another ischemic evaluation.      Relevant Orders   EKG 12-Lead       COVID-19 Education: The  signs and symptoms of COVID-19 were discussed with the patient and how to seek care for testing (follow up with PCP or arrange E-visit).   The importance of social distancing was discussed today.  I spent a total of 59mnutes with the patient and chart review. >  50% of the time was spent in direct patient consultation.  Additional time spent with chart review (studies, outside notes, etc): 10 Total Time: 32 min   Current medicines are reviewed at length with the patient today.  (+/- concerns) n/a   Patient Instructions / Medication Changes & Studies & Tests Ordered   Patient Instructions  Medication Instructions:  Stop taking Atorvastatin  For 1 month  Contact office if memory has in improve after one month , if no improvement  Start taking Rosuvastatin 20 mg at bedtime  If you have break through  Chest pain or palpations- you may take an extra flecainide tablet  50 mg if you just took your dose  Along with 50 mg of metoprolol Tartrate( lopressor) , if it has been 6 six hours or more since your schedule dose of flecainide - then take 100 mg ( 2 tablets) with Lopressor 50 mg , and the take regular dose of flecainide at the the regular time.   *If you need a refill on your cardiac medications before your next appointment, please call your pharmacy*  Lab Work: Not needed  Testing/Procedures: Not needed  Follow-Up: [facility] you and your health needs are our priority.  As part of our continuing mission to provide you with exceptional heart care, we have created designated Provider Care Teams.  These Care Teams include your primary Cardiologist (physician) and Advanced Practice Providers (APPs -  Physician Assistants and Nurse Practitioners) who all work together to provide you with the care you need, when you need it.  Your next appointment:   6 month(s)  The format for your next appointment:   In Person  Provider:    KJory Sims DNP, ANP  6 MONTHS _ JUN  2021  DGlenetta Hew MD   - 12 months _ [id number]  Other Instructions     Studies Ordered:   Orders Placed This Encounter  Procedures  . EKG 12-Lead     DGlenetta Hew M.D., M.S. Interventional Cardiologist   Pager # [phone] # [phone] [place] [address]  Thank you for [facility]!

## 2019-08-04 NOTE — Patient Instructions (Addendum)
Medication Instructions:  Stop taking Atorvastatin  For 1 month  Contact office if memory has in improve after one month , if no improvement  Start taking Rosuvastatin 20 mg at bedtime  If you have break through  Chest pain or palpations- you may take an extra flecainide tablet  50 mg if you just took your dose  Along with 50 mg of metoprolol Tartrate( lopressor) , if it has been 6 six hours or more since your schedule dose of flecainide - then take 100 mg ( 2 tablets) with Lopressor 50 mg , and the take regular dose of flecainide at the the regular time.   *If you need a refill on your cardiac medications before your next appointment, please call your pharmacy*  Lab Work: Not needed  Testing/Procedures: Not needed  Follow-Up: At [facility], you and your health needs are our priority.  As part of our continuing mission to provide you with exceptional heart care, we have created designated Provider Care Teams.  These Care Teams include your primary Cardiologist (physician) and Advanced Practice Providers (APPs -  Physician Assistants and Nurse Practitioners) who all work together to provide you with the care you need, when you need it.  Your next appointment:   6 month(s)  The format for your next appointment:   In Person  Provider:    Jory Sims, DNP, ANP  6 MONTHS _ JUN 2021  Glenetta Hew, MD   - 12 months _ [place]  Other Instructions   We did not call in the prescription for Lopressor (short acting metoprolol) 50 mg daily to use in conjunction with the additional dose of flecainide.  Rx: Lopressor 50 mg p.o. as needed breakthrough A. fib (take with flecainide).  Dispense: 20 tab, 3 refill

## 2019-08-04 NOTE — Assessment & Plan Note (Signed)
When I first met her, she had symptoms very concerning for angina during an episode of A. fib RVR.  She underwent cardiac catheterization at time of the day which did not show any CAD.  She has subsequently had a coronary CTA last December showing 20 calcium score 26 with no significant disease.  Attempted relook had significant motion artifact, but for the coronary cath score still 24 unlikely there is been progression of disease.  I think her symptoms of A. fib are similar to what 1 would consider angina.  I do not think she needs another ischemic evaluation.

## 2019-08-05 ENCOUNTER — Encounter: Payer: Self-pay | Admitting: Cardiology

## 2019-08-05 NOTE — Assessment & Plan Note (Addendum)
She has now had several breakthrough episodes and a little bit concerned that every time she has been she is very symptomatic.  Doing well on Eliquis.  For the most part well controlled on current dose of Toprol with low-dose flecainide.  Plan: We will have her use an additional dose of flecainide as well as a as needed 50 mg Toprol for breakthrough episodes of A. Fib.  If you have break through  Chest pain or palpations- you may take an extra flecainide tablet  50 mg if you just took your dose  Along with 50 mg of metoprolol Tartrate( lopressor) , if it has been 6 six hours or more since your schedule dose of flecainide - then take 100 mg ( 2 tablets) with Lopressor 50 mg , and the take regular dose of flecainide at the the regular time.

## 2019-08-05 NOTE — Assessment & Plan Note (Signed)
Blood pressure is high today, but she has not yet taken her medicines.  Target systolic range for her should be 125 to 150 mmHg.  Would not want to be too aggressive.

## 2019-08-05 NOTE — Assessment & Plan Note (Signed)
Her cholesterol was quite elevated as of August and then she was started on atorvastatin.  I am concerned about her memory issues and would like to see how she does off of the statin altogether.  With relatively normal coronary calcium score and coronary CTA, I do not think we need to be all that aggressive since she is now starting to show signs of dementia.  Recommendations: Stop taking Atorvastatin  For 1 month  Contact office if memory has in improve after one month , if no improvement  Start taking Rosuvastatin 20 mg at bedtime

## 2019-08-05 NOTE — Assessment & Plan Note (Signed)
Currently on Eliquis.  Normal renal function and not yet 82, but does have borderline weight.  Were she to have worsening renal function, and when she gets older, would probably need to reduce to 2.5 mg twice daily.

## 2019-08-08 ENCOUNTER — Other Ambulatory Visit: Payer: Self-pay | Admitting: *Deleted

## 2019-08-08 MED ORDER — METOPROLOL TARTRATE 50 MG PO TABS: ORAL_TABLET | ORAL | 6 refills | Status: DC

## 2019-08-08 NOTE — Progress Notes (Signed)
Prescription sent from  Recent office visit .  To take in addition with flecainide

## 2019-08-09 ENCOUNTER — Ambulatory Visit
Admission: RE | Admit: 2019-08-09 | Discharge: 2019-08-09 | Disposition: A | Discharge status: Home / Self Care | Payer: Medicare Other | Source: Ambulatory Visit | Attending: Neurology | Admitting: Neurology

## 2019-08-09 ENCOUNTER — Other Ambulatory Visit: Payer: Self-pay

## 2019-08-09 DIAGNOSIS — R413 Other amnesia: Secondary | ICD-10-CM | POA: Diagnosis not present

## 2019-08-11 ENCOUNTER — Telehealth: Payer: Self-pay | Admitting: Neurology

## 2019-08-11 NOTE — Telephone Encounter (Signed)
I called and talk to the husband.  The CT of the brain showed mild small vessel disease, the patient likely has Alzheimer's process causing her memory issue.   CT head 08/11/19:  IMPRESSION:   CT head (without) demonstrating: - Mild atrophy and mild chronic small vessel ischemic disease.  - No acute findings.

## 2019-08-16 ENCOUNTER — Telehealth (INDEPENDENT_AMBULATORY_CARE_PROVIDER_SITE_OTHER): Payer: Medicare Other | Admitting: Emergency Medicine

## 2019-08-16 ENCOUNTER — Other Ambulatory Visit: Payer: Self-pay

## 2019-08-16 ENCOUNTER — Encounter: Payer: Self-pay | Admitting: Emergency Medicine

## 2019-08-16 VITALS — Ht 64.0 in | Wt 120.0 lb

## 2019-08-16 DIAGNOSIS — R32 Unspecified urinary incontinence: Secondary | ICD-10-CM

## 2019-08-16 DIAGNOSIS — R350 Frequency of micturition: Secondary | ICD-10-CM | POA: Diagnosis not present

## 2019-08-16 NOTE — Progress Notes (Signed)
Telemedicine Encounter- SOAP NOTE Established Patient  This telephone encounter was conducted with the patient's (or proxy's) verbal consent via audio telecommunications: yes/no: Yes Patient was instructed to have this encounter in a suitably private space; and to only have persons present to whom they give permission to participate. In addition, patient identity was confirmed by use of name plus two identifiers (DOB and address).  I discussed the limitations, risks, security and privacy concerns of performing an evaluation and management service by telephone and the availability of in person appointments. I also discussed with the patient that there may be a patient responsible charge related to this service. The patient expressed understanding and agreed to proceed.  I spent a total of TIME; 0 MIN TO 60 MIN: 15 minutes talking with the patient or their proxy.  Chief Complaint  Patient presents with  . Urinary Frequency    with burning for one week, per pt 4-5 times and wants referral to Urology     Subjective   Donna Simmons is a 78 y.o. female established patient. Telephone visit today complaining of urinary frequency with occasional urinary incontinence for the past 2 months.  No other associated urinary symptoms.  Denies dysuria, hematuria, fever or chills, burning, or any other significant symptoms.  Requesting referral to urology.  HPI   Patient Active Problem List   Diagnosis Date Noted  . Chest pain with moderate risk for cardiac etiology 07/25/2018  . Long term current use of anticoagulant therapy 11/18/2012  . Hypercholesteremia 09/25/2012  . DDD (degenerative disc disease), cervical 05/19/2012  . Paroxysmal atrial fibrillation (HCC) - CHA2DS2-VASc Score 3, on Eliquis 04/20/2012  . Essential hypertension 04/20/2012    Past Medical History:  Diagnosis Date  . Allergy   . GERD (gastroesophageal reflux disease)   . Heart murmur    No significant valvular lesion  noted on echo.  . Hypercholesteremia   . Hypertension   . Paroxysmal atrial fibrillation (HCC) 12/27/2008    Current Outpatient Medications  Medication Sig Dispense Refill  . acetaminophen (TYLENOL) 325 MG tablet Take 325-650 mg by mouth daily as needed (pain or headaches).     Marland Kitchen amLODipine (NORVASC) 10 MG tablet Take 10 mg by mouth daily.     Marland Kitchen apixaban (ELIQUIS) 5 MG TABS tablet Take 1 tablet (5 mg total) by mouth 2 (two) times daily. 180 tablet 1  . ENSURE (ENSURE) Take 237 mLs by mouth daily.    . flecainide (TAMBOCOR) 50 MG tablet Take 1 tablet (50 mg total) by mouth 2 (two) times daily. 180 tablet 3  . fluticasone (FLONASE) 50 MCG/ACT nasal spray USE TWO SPRAY(S) IN EACH NOSTRIL ONCE DAILY (Patient taking differently: Place 2 sprays into both nostrils daily as needed for allergies. ) 16 g 1  . lisinopril (ZESTRIL) 20 MG tablet Take 1 tablet (20 mg total) by mouth daily. 90 tablet 3  . memantine (NAMENDA) 5 MG tablet Take 1 tablet daily for one week, then take 1 tablet twice daily for one week, then take 1 tablet in the morning and 2 in the evening for one week, then take 2 tablets twice daily 70 tablet 0  . metoprolol succinate (TOPROL-XL) 50 MG 24 hr tablet Take 50 mg by mouth daily after lunch.     . Multiple Vitamins-Minerals (CENTRUM SILVER 50+WOMEN) TABS Take 1 tablet by mouth daily with lunch.    . rosuvastatin (CRESTOR) 20 MG tablet Take 1 tablet (20 mg total) by mouth daily. 90  tablet 3  . sucralfate (CARAFATE) 1 GM/10ML suspension Take 10 mLs (1 g total) by mouth 4 (four) times daily -  with meals and at bedtime. (Patient taking differently: Take 1 g by mouth daily as needed (when eating gas-producing foods). ) 420 mL 0  . Blood Pressure Monitoring (BLOOD PRESSURE CUFF) MISC Take your  blood pressure with blood pressure cuff/moniotor- can be automatic or manual - one to three  times a week as needed 1 each 0  . metoprolol tartrate (LOPRESSOR) 50 MG tablet Take 1 tablet only with  flecainide as needed for rapid fast heart rate (Patient not taking: Reported on 08/16/2019) 10 tablet 6   No current facility-administered medications for this visit.    Allergies  Allergen Reactions  . Antihistamines, Chlorpheniramine-Type Palpitations    Makes heart beat fast    Social History   Socioeconomic History  . Marital status: Married    Spouse name: Not on file  . Number of children: Not on file  . Years of education: Not on file  . Highest education level: Not on file  Occupational History  . Not on file  Tobacco Use  . Smoking status: Never Smoker  . Smokeless tobacco: Never Used  Substance and Sexual Activity  . Alcohol use: No    Alcohol/week: 0.0 standard drinks  . Drug use: No  . Sexual activity: Yes  Other Topics Concern  . Not on file  Social History Narrative   Married with no children. Walks several times a week at least 3-4 times a week about 2 miles a time. Does not smoke and does not drink.   Social Determinants of Health   Financial Resource Strain:   . Difficulty of Paying Living Expenses: Not on file  Food Insecurity:   . Worried About Programme researcher, broadcasting/film/videounning Out of Food in the Last Year: Not on file  . Ran Out of Food in the Last Year: Not on file  Transportation Needs:   . Lack of Transportation (Medical): Not on file  . Lack of Transportation (Non-Medical): Not on file  Physical Activity:   . Days of Exercise per Week: Not on file  . Minutes of Exercise per Session: Not on file  Stress:   . Feeling of Stress : Not on file  Social Connections:   . Frequency of Communication with Friends and Family: Not on file  . Frequency of Social Gatherings with Friends and Family: Not on file  . Attends Religious Services: Not on file  . Active Member of Clubs or Organizations: Not on file  . Attends BankerClub or Organization Meetings: Not on file  . Marital Status: Not on file  Intimate Partner Violence:   . Fear of Current or Ex-Partner: Not on file  .  Emotionally Abused: Not on file  . Physically Abused: Not on file  . Sexually Abused: Not on file    Review of Systems  Constitutional: Negative.  Negative for chills and fever.  HENT: Negative.  Negative for congestion and sore throat.   Respiratory: Negative.  Negative for cough and shortness of breath.   Cardiovascular: Negative.  Negative for chest pain and palpitations.  Gastrointestinal: Negative.  Negative for abdominal pain, nausea and vomiting.  Genitourinary: Positive for frequency. Negative for flank pain, hematuria and urgency.  Musculoskeletal: Negative.  Negative for myalgias.  Skin: Negative.  Negative for rash.  Neurological: Negative for dizziness and headaches.  All other systems reviewed and are negative.   Objective  Alert and  oriented x3 in no apparent respiratory distress. Vitals as reported by the patient: Today's Vitals   08/16/19 1647  Weight: 120 lb (54.4 kg)  Height: 5\' 4"  (1.626 m)    There are no diagnoses linked to this encounter. Joelly was seen today for urinary frequency.  Diagnoses and all orders for this visit:  Urinary frequency -     Ambulatory referral to Urology  Intermittent urinary incontinence -     Ambulatory referral to Urology     I discussed the assessment and treatment plan with the patient. The patient was provided an opportunity to ask questions and all were answered. The patient agreed with the plan and demonstrated an understanding of the instructions.   The patient was advised to call back or seek an in-person evaluation if the symptoms worsen or if the condition fails to improve as anticipated.  I provided 15 minutes of non-face-to-face time during this encounter.  Horald Pollen, MD  Primary Care at [facility]

## 2019-09-13 DIAGNOSIS — N3281 Overactive bladder: Secondary | ICD-10-CM | POA: Diagnosis not present

## 2019-09-17 ENCOUNTER — Other Ambulatory Visit: Payer: Self-pay | Admitting: Adult Health

## 2019-10-05 ENCOUNTER — Encounter: Payer: Self-pay | Admitting: Emergency Medicine

## 2019-10-05 ENCOUNTER — Ambulatory Visit: Payer: Medicare PPO | Admitting: Emergency Medicine

## 2019-10-05 ENCOUNTER — Other Ambulatory Visit: Payer: Self-pay

## 2019-10-05 VITALS — BP 158/80 | HR 67 | Temp 98.3°F | Resp 16 | Ht 64.0 in | Wt 121.0 lb

## 2019-10-05 DIAGNOSIS — I480 Paroxysmal atrial fibrillation: Secondary | ICD-10-CM

## 2019-10-05 DIAGNOSIS — I208 Other forms of angina pectoris: Secondary | ICD-10-CM

## 2019-10-05 DIAGNOSIS — E785 Hyperlipidemia, unspecified: Secondary | ICD-10-CM | POA: Diagnosis not present

## 2019-10-05 DIAGNOSIS — Z7901 Long term (current) use of anticoagulants: Secondary | ICD-10-CM | POA: Diagnosis not present

## 2019-10-05 DIAGNOSIS — I10 Essential (primary) hypertension: Secondary | ICD-10-CM

## 2019-10-05 NOTE — Assessment & Plan Note (Signed)
Stable

## 2019-10-05 NOTE — Assessment & Plan Note (Signed)
Well-controlled on medication including beta-blocker and long-term anticoagulation.  No concerns.

## 2019-10-05 NOTE — Progress Notes (Signed)
[name] 79 y.o.   Chief Complaint  Patient presents with  . Hypertension    FOLLOW UP and Medical condition    HISTORY OF PRESENT ILLNESS: This is a 79 y.o. female with multiple chronic medical problems here for follow-up.  Has the following chronic medical problems: 1.  Paroxysmal atrial fibrillation on beta-blocker and long-term anticoagulation, Eliquis.  Also on flecainide 50 mg twice daily. 2.  Hypertension: On amlodipine 10 mg daily, lisinopril 20 mg daily.  Blood pressure readings at home 130s over 80s. 3.  Dyslipidemia: On Crestor 20 mg daily. Also recently started on Namenda.  Noticed some intermittent jerk movements of both hands that started after starting this medication. No other complaints or medical concerns today  HPI   Prior to Admission medications   Medication Sig Start Date End Date Taking? Authorizing Provider  acetaminophen (TYLENOL) 325 MG tablet Take 325-650 mg by mouth daily as needed (pain or headaches).    Yes [provider]  amLODipine (NORVASC) 10 MG tablet Take 10 mg by mouth daily.  09/14/17  Yes [provider]  apixaban (ELIQUIS) 5 MG TABS tablet Take 1 tablet (5 mg total) by mouth 2 (two) times daily. 07/15/19  Yes Marykay Lex, MD  ENSURE (ENSURE) Take 237 mLs by mouth daily.   Yes [provider]  flecainide (TAMBOCOR) 50 MG tablet Take 1 tablet (50 mg total) by mouth 2 (two) times daily. 07/08/19  Yes Marykay Lex, MD  fluticasone [facility]) 50 MCG/ACT nasal spray USE TWO SPRAY(S) IN EACH NOSTRIL ONCE DAILY Patient taking differently: Place 2 sprays into both nostrils daily as needed for allergies.  01/05/17  Yes [name], MD  lisinopril (ZESTRIL) 20 MG tablet Take 1 tablet (20 mg total) by mouth daily. 08/04/19  Yes Marykay Lex, MD  memantine [facility]) 5 MG tablet Take 1 tablet daily for one week, then take 1 tablet twice daily for one week, then take 1 tablet in the morning and 2 in the evening  for one week, then take 2 tablets twice daily 07/27/19  Yes York Spaniel, MD  metoprolol succinate (TOPROL-XL) 50 MG 24 hr tablet TAKE 1 TABLET BY MOUTH ONCE DAILY TAKE  WITH  OR  IMMEDIATELY  FOLLOWING  A  MEAL 09/19/19  Yes Marykay Lex, MD  metoprolol tartrate (LOPRESSOR) 50 MG tablet Take 1 tablet only with flecainide as needed for rapid fast heart rate 08/08/19  Yes Marykay Lex, MD  mirabegron ER (MYRBETRIQ) 25 MG TB24 tablet Take 25 mg by mouth daily.   Yes [provider]  Multiple Vitamins-Minerals (CENTRUM SILVER 50+WOMEN) TABS Take 1 tablet by mouth daily with lunch.   Yes [provider]  rosuvastatin (CRESTOR) 20 MG tablet Take 1 tablet (20 mg total) by mouth daily. 08/04/19 11/02/19 Yes Marykay Lex, MD  sucralfate (CARAFATE) 1 GM/10ML suspension Take 10 mLs (1 g total) by mouth 4 (four) times daily -  with meals and at bedtime. Patient taking differently: Take 1 g by mouth daily as needed (when eating gas-producing foods).  01/05/18  Yes [name], MD  Blood Pressure Monitoring (BLOOD PRESSURE CUFF) MISC Take your  blood pressure with blood pressure cuff/moniotor- can be automatic or manual - one to three  times a week as needed 04/20/19   Marykay Lex, MD    Allergies  Allergen Reactions  . Antihistamines, Chlorpheniramine-Type Palpitations    Makes heart beat fast  Patient Active Problem List   Diagnosis Date Noted  . Other forms of angina pectoris (HCC) 07/25/2018  . Long term current use of anticoagulant therapy 11/18/2012  . Hypercholesteremia 09/25/2012  . DDD (degenerative disc disease), cervical 05/19/2012  . Paroxysmal atrial fibrillation (HCC) - CHA2DS2-VASc Score 3, on Eliquis 04/20/2012  . Essential hypertension 04/20/2012    Past Medical History:  Diagnosis Date  . Allergy   . GERD (gastroesophageal reflux disease)   . Heart murmur    No significant valvular lesion noted on echo.  . Hypercholesteremia   .  Hypertension   . Paroxysmal atrial fibrillation (HCC) 12/27/2008    Past Surgical History:  Procedure Laterality Date  . ABDOMINAL HYSTERECTOMY  1980  . BREAST SURGERY    . CT CTA CORONARY W/CA SCORE W/CM &/OR WO/CM  08/2018   Coronary calcium score 24.  Nonobstructive CAD with less than 30% plaque in proximal LAD.  Normal aortic root. ->  Relook in October 2020 had significant motion artifact but coronary calcium score 26.  Marland Kitchen EYE SURGERY Right 12/29/2016  . EYE SURGERY Left 01/19/2017  . FRACTURE SURGERY    . LEFT HEART CATH AND CORONARY ANGIOGRAPHY  12/16/2010   no evidence of CAD to explain anginal pain w/ positive troponin.  potential etiology is breakthrough AF  . NM MYOVIEW LTD  April 2010   EF 64%, normal pattern of perfusion in all regions, no scintigraphic evidence of inducible ischemia; no significant wall motion abnormalities; EKG negative for ischemia; no significant change from last study; low risk scan  . TRANSTHORACIC ECHOCARDIOGRAM  07/2018   Normal EF 55-60%.  No RWMA. GR 1 DD. Ao Sclerosis - no Stenosis. Mild AI. Trivial MR.     Social History   Socioeconomic History  . Marital status: Married    Spouse name: Not on file  . Number of children: Not on file  . Years of education: Not on file  . Highest education level: Not on file  Occupational History  . Not on file  Tobacco Use  . Smoking status: Never Smoker  . Smokeless tobacco: Never Used  Substance and Sexual Activity  . Alcohol use: No    Alcohol/week: 0.0 standard drinks  . Drug use: No  . Sexual activity: Yes  Other Topics Concern  . Not on file  Social History Narrative   Married with no children. Walks several times a week at least 3-4 times a week about 2 miles a time. Does not smoke and does not drink.   Social Determinants of Health   Financial Resource Strain:   . Difficulty of Paying Living Expenses: Not on file  Food Insecurity:   . Worried About Programme researcher, broadcasting/film/video in the Last Year: Not  on file  . Ran Out of Food in the Last Year: Not on file  Transportation Needs:   . Lack of Transportation (Medical): Not on file  . Lack of Transportation (Non-Medical): Not on file  Physical Activity:   . Days of Exercise per Week: Not on file  . Minutes of Exercise per Session: Not on file  Stress:   . Feeling of Stress : Not on file  Social Connections:   . Frequency of Communication with Friends and Family: Not on file  . Frequency of Social Gatherings with Friends and Family: Not on file  . Attends Religious Services: Not on file  . Active Member of Clubs or Organizations: Not on file  . Attends Banker  Meetings: Not on file  . Marital Status: Not on file  Intimate Partner Violence:   . Fear of Current or Ex-Partner: Not on file  . Emotionally Abused: Not on file  . Physically Abused: Not on file  . Sexually Abused: Not on file    Family History  Problem Relation Age of Onset  . Arthritis Mother        OSTEO  . Hypertension Mother   . Dementia Mother   . Heart disease Father   . Hypertension Sister   . Dementia Sister   . Cancer - Lung Brother   . Cancer Maternal Grandmother   . Heart disease Maternal Grandfather   . Cancer - Lung Brother   . Hypertension Brother   . Cancer - Prostate Brother      Review of Systems  Constitutional: Negative.  Negative for chills and fever.  HENT: Negative.  Negative for congestion and sore throat.   Eyes: Negative.   Respiratory: Negative.  Negative for cough and shortness of breath.   Cardiovascular: Negative.  Negative for chest pain and palpitations.  Gastrointestinal: Negative.  Negative for abdominal pain, diarrhea, nausea and vomiting.  Genitourinary: Negative.  Negative for dysuria and hematuria.  Musculoskeletal: Negative.  Negative for back pain, myalgias and neck pain.  Skin: Negative.  Negative for rash.  Neurological: Negative.  Negative for dizziness and headaches.  Endo/Heme/Allergies: Negative.     All other systems reviewed and are negative.   Today's Vitals   10/05/19 0842 10/05/19 0916  BP: (!) 162/86 (!) 158/80  Pulse: 67   Resp: 16   Temp: 98.3 F (36.8 C)   TempSrc: Temporal   SpO2: 100%   Weight: 121 lb (54.9 kg)   Height: 5\' 4"  (1.626 m)    Body mass index is 20.77 kg/m.  Physical Exam Vitals reviewed.  Constitutional:      Appearance: Normal appearance.  HENT:     Head: Normocephalic.  Eyes:     Extraocular Movements: Extraocular movements intact.     Pupils: Pupils are equal, round, and reactive to light.  Cardiovascular:     Rate and Rhythm: Normal rate and regular rhythm.     Pulses: Normal pulses.     Heart sounds: Normal heart sounds.  Pulmonary:     Effort: Pulmonary effort is normal.     Breath sounds: Normal breath sounds.  Abdominal:     General: There is no distension.     Palpations: Abdomen is soft.     Tenderness: There is no abdominal tenderness.  Musculoskeletal:        General: Normal range of motion.     Cervical back: Normal range of motion and neck supple.     Comments: Mild bilateral ankle swelling most likely amlodipine effect  Skin:    General: Skin is warm and dry.     Capillary Refill: Capillary refill takes less than 2 seconds.  Neurological:     General: No focal deficit present.     Mental Status: She is alert and oriented to person, place, and time.  Psychiatric:        Mood and Affect: Mood normal.        Behavior: Behavior normal.    A total of 30 minutes was spent with the patient, greater than 50% of which was in counseling/coordination of care regarding chronic medical problems, review of all medications and side effects, review of most recent office visit notes and specialist notes, review of most  recent blood work, diet and nutrition, prognosis, need for blood work today, and need for follow-up.   ASSESSMENT & PLAN: Clinically stable.  Doing well.  Stable chronic medical conditions.  Continue present  medications.  No changes.  Follow-up in 6 months. Jaritza was seen today for hypertension.  Diagnoses and all orders for this visit:  Essential hypertension -     Comprehensive metabolic panel  Paroxysmal atrial fibrillation (HCC)  Other forms of angina pectoris (HCC)  Long term current use of anticoagulant therapy  Dyslipidemia -     Lipid panel    Patient Instructions       If you have lab work done today you will be contacted with your lab results within the next 2 weeks.  If you have not heard from [place] then please contact us. The fastest way to get your results is to register for My Chart.   IF you received an x-ray today, you will receive an invoice from [facility]. Please contact [facility] at [phone] with questions or concerns regarding your invoice.   IF you received labwork today, you will receive an invoice from [facility]. Please contact [facility] at [phone] with questions or concerns regarding your invoice.   Our billing staff will not be able to assist you with questions regarding bills from these companies.  You will be contacted with the lab results as soon as they are available. The fastest way to get your results is to activate your My Chart account. Instructions are located on the last page of this paperwork. If you have not heard from [place] regarding the results in 2 weeks, please contact this office.     Bleeding Precautions When on Anticoagulant Therapy, Adult Anticoagulant therapy, also called blood thinner therapy, is medicine that helps to prevent and treat blood clots. The medicine works by stopping blood clots from forming or growing. Blood clots that form in your blood vessels can be dangerous. They can break loose and travel to the heart, lungs, or brain. This increases the risk of a heart attack, stroke, or blocked lung artery (pulmonary embolism). Anticoagulants also increase the risk of bleeding. Try to protect yourself from  cuts and other injuries that can cause bleeding. It is important to take anticoagulants exactly as told by your health care provider. Why do I need to be on anticoagulant therapy? You may need this medicine if you are at risk of developing a blood clot. Conditions that increase your risk of a blood clot include:  Being born with heart disease or a heart malformation (congenital heart disease).  Developing heart disease.  Having had surgery, such as valve replacement.  Having had a serious accident or other type of severe injury (trauma).  Having certain types of cancer.  Having certain diseases that can increase blood clotting.  Having a high risk of stroke or heart attack.  Having atrial fibrillation (AF). What are the common anticoagulant medicines? There are several types of anticoagulant medicines. The most common types are:  Medicines that you take by mouth (oral medicines), such as: ? Warfarin. ? Novel oral anticoagulants (NOACs), such as:  Direct thrombin inhibitors (dabigatran).  Factor Xa inhibitors (apixaban, edoxaban, and rivaroxaban).  Injections, such as: ? Unfractionated heparin. ? Low molecular weight heparin. These anticoagulants work in different ways to prevent blood clots. They also have different risks and side effects. What do I need to remember while on anticoagulant therapy? Taking anticoagulants  Take your medicine at the same time every  day. If you forget to take your medicine, take it as soon as you remember. Do not double your dosage of medicine if you miss a whole day. Take your normal dose and call your health care provider.  Do not stop taking your medicine unless your health care provider approves. Stopping the medicine can increase your risk of developing a blood clot. Taking other medicines  Take over-the-counter and prescriptions medicines only as told by your health care provider.  Do not take over-the-counter NSAIDs, including aspirin  and ibuprofen, while you are on anticoagulant therapy. These medicines increase your risk of dangerous bleeding.  Get approval from your health care provider before you start taking any new medicines, vitamins, or herbal products. Some of these could interfere with your therapy. General instructions  Keep all follow-up visits as told by your health care provider. This is important.  If you are pregnant or trying to get pregnant, talk with a health care provider about anticoagulants. Some of these medicines are not safe to take during pregnancy.  Tell all health care providers, including your dentist, that you are on anticoagulant therapy. It is especially important to tell providers before you have any surgery, medical procedures, or dental work done. What precautions should I take?   Be very careful when using knives, scissors, or other sharp objects.  Use an electric razor instead of a blade.  Do not use toothpicks.  Use a soft-bristled toothbrush. Brush your teeth gently.  Always wear shoes outdoors and wear slippers indoors.  Be careful when cutting your fingernails and toenails.  Place bath mats in the bathroom. If possible, install handrails as well.  Wear gloves while you do yard work.  Wear your seat belt.  Prevent falls by removing loose rugs and extension cords from areas where you walk. Use a cane or walker if you need it.  Avoid constipation by: ? Drinking enough fluid to keep your urine clear or pale yellow. ? Eating foods that are high in fiber, such as fresh fruits and vegetables, whole grains, and beans. ? Limiting foods that are high in fat and processed sugars, such as fried and sweet foods.  Do not play contact sports or participate in other activities that have a high risk for injury. What other precautions are important if on warfarin therapy? If you are taking a type of anticoagulant called warfarin, make sure you:  Work with a diet and nutrition  specialist (dietitian) to make an eating plan. Do not make any sudden changes to your diet after you have started your eating plan.  Do not drink alcohol. It can interfere with your medicine and increase your risk of an injury that causes bleeding.  Get regular blood tests as told by your health care provider. What are some questions to ask my health care provider?  Why do I need anticoagulant therapy?  What is the best anticoagulant therapy for my condition?  How long will I need anticoagulant therapy?  What are the side effects of anticoagulant therapy?  When should I take my medicine? What should I do if I forget to take it?  Will I need to have regular blood tests?  Do I need to change my diet? Are there foods or drinks that I should avoid?  What activities are safe for me?  What should I do if I want to get pregnant? Contact a health care provider if:  You miss a dose of medicine: ? And you are not sure what  to do. ? For more than one day.  You have: ? Menstrual bleeding that is heavier than normal. ? Bloody or brown urine. ? Easy bruising. ? Black and tarry stool or bright red stool. ? Side effects from your medicine.  You feel weak or dizzy.  You become pregnant. Get help right away if:  You have bleeding that will not stop within 20 minutes from: ? The nose. ? The gums. ? A cut on the skin.  You have a severe headache or stomachache.  You vomit or cough up blood.  You fall or hit your head. Summary  Anticoagulant therapy, also called blood thinner therapy, is medicine that helps to prevent and treat blood clots.  Anticoagulants work in different ways to prevent blood clots. They also have different risks and side effects.  Talk with your health care provider about any precautions that you should take while on anticoagulant therapy. This information is not intended to replace advice given to you by your health care provider. Make sure you discuss any  questions you have with your health care provider. Document Revised: 12/08/2018 Document Reviewed: 11/04/2016 Elsevier Patient Education  2020 [employer]  Hypertension, Adult High blood pressure (hypertension) is when the force of blood pumping through the arteries is too strong. The arteries are the blood vessels that carry blood from the heart throughout the body. Hypertension forces the heart to work harder to pump blood and may cause arteries to become narrow or stiff. Untreated or uncontrolled hypertension can cause a heart attack, heart failure, a stroke, kidney disease, and other problems. A blood pressure reading consists of a higher number over a lower number. Ideally, your blood pressure should be below 120/80. The first ("top") number is called the systolic pressure. It is a measure of the pressure in your arteries as your heart beats. The second ("bottom") number is called the diastolic pressure. It is a measure of the pressure in your arteries as the heart relaxes. What are the causes? The exact cause of this condition is not known. There are some conditions that result in or are related to high blood pressure. What increases the risk? Some risk factors for high blood pressure are under your control. The following factors may make you more likely to develop this condition:  Smoking.  Having type 2 diabetes mellitus, high cholesterol, or both.  Not getting enough exercise or physical activity.  Being overweight.  Having too much fat, sugar, calories, or salt (sodium) in your diet.  Drinking too much alcohol. Some risk factors for high blood pressure may be difficult or impossible to change. Some of these factors include:  Having chronic kidney disease.  Having a family history of high blood pressure.  Age. Risk increases with age.  Race. You may be at higher risk if you are African American.  Gender. Men are at higher risk than women before age 77. After age 77, women  are at higher risk than men.  Having obstructive sleep apnea.  Stress. What are the signs or symptoms? High blood pressure may not cause symptoms. Very high blood pressure (hypertensive crisis) may cause:  Headache.  Anxiety.  Shortness of breath.  Nosebleed.  Nausea and vomiting.  Vision changes.  Severe chest pain.  Seizures. How is this diagnosed? This condition is diagnosed by measuring your blood pressure while you are seated, with your arm resting on a flat surface, your legs uncrossed, and your feet flat on the floor. The cuff of the  blood pressure monitor will be placed directly against the skin of your upper arm at the level of your heart. It should be measured at least twice using the same arm. Certain conditions can cause a difference in blood pressure between your right and left arms. Certain factors can cause blood pressure readings to be lower or higher than normal for a short period of time:  When your blood pressure is higher when you are in a health care provider's office than when you are at home, this is called white coat hypertension. Most people with this condition do not need medicines.  When your blood pressure is higher at home than when you are in a health care provider's office, this is called masked hypertension. Most people with this condition may need medicines to control blood pressure. If you have a high blood pressure reading during one visit or you have normal blood pressure with other risk factors, you may be asked to:  Return on a different day to have your blood pressure checked again.  Monitor your blood pressure at home for 1 week or longer. If you are diagnosed with hypertension, you may have other blood or imaging tests to help your health care provider understand your overall risk for other conditions. How is this treated? This condition is treated by making healthy lifestyle changes, such as eating healthy foods, exercising more, and  reducing your alcohol intake. Your health care provider may prescribe medicine if lifestyle changes are not enough to get your blood pressure under control, and if:  Your systolic blood pressure is above 130.  Your diastolic blood pressure is above 80. Your personal target blood pressure may vary depending on your medical conditions, your age, and other factors. Follow these instructions at home: Eating and drinking   Eat a diet that is high in fiber and potassium, and low in sodium, added sugar, and fat. An example eating plan is called the DASH (Dietary Approaches to Stop Hypertension) diet. To eat this way: ? Eat plenty of fresh fruits and vegetables. Try to fill one half of your plate at each meal with fruits and vegetables. ? Eat whole grains, such as whole-wheat pasta, brown rice, or whole-grain bread. Fill about one fourth of your plate with whole grains. ? Eat or drink low-fat dairy products, such as skim milk or low-fat yogurt. ? Avoid fatty cuts of meat, processed or cured meats, and poultry with skin. Fill about one fourth of your plate with lean proteins, such as fish, chicken without skin, beans, eggs, or tofu. ? Avoid pre-made and processed foods. These tend to be higher in sodium, added sugar, and fat.  Reduce your daily sodium intake. Most people with hypertension should eat less than 1,500 mg of sodium a day.  Do not drink alcohol if: ? Your health care provider tells you not to drink. ? You are pregnant, may be pregnant, or are planning to become pregnant.  If you drink alcohol: ? Limit how much you use to:  0-1 drink a day for women.  0-2 drinks a day for men. ? Be aware of how much alcohol is in your drink. In the [place] one drink equals one 12 oz bottle of beer (355 mL), one 5 oz glass of wine (148 mL), or one 1 oz glass of hard liquor (44 mL). Lifestyle   Work with your health care provider to maintain a healthy body weight or to lose weight. Ask what an ideal  weight is for  you.  Get at least 30 minutes of exercise most days of the week. Activities may include walking, swimming, or biking.  Include exercise to strengthen your muscles (resistance exercise), such as Pilates or lifting weights, as part of your weekly exercise routine. Try to do these types of exercises for 30 minutes at least 3 days a week.  Do not use any products that contain nicotine or tobacco, such as cigarettes, e-cigarettes, and chewing tobacco. If you need help quitting, ask your health care provider.  Monitor your blood pressure at home as told by your health care provider.  Keep all follow-up visits as told by your health care provider. This is important. Medicines  Take over-the-counter and prescription medicines only as told by your health care provider. Follow directions carefully. Blood pressure medicines must be taken as prescribed.  Do not skip doses of blood pressure medicine. Doing this puts you at risk for problems and can make the medicine less effective.  Ask your health care provider about side effects or reactions to medicines that you should watch for. Contact a health care provider if you:  Think you are having a reaction to a medicine you are taking.  Have headaches that keep coming back (recurring).  Feel dizzy.  Have swelling in your ankles.  Have trouble with your vision. Get help right away if you:  Develop a severe headache or confusion.  Have unusual weakness or numbness.  Feel faint.  Have severe pain in your chest or abdomen.  Vomit repeatedly.  Have trouble breathing. Summary  Hypertension is when the force of blood pumping through your arteries is too strong. If this condition is not controlled, it may put you at risk for serious complications.  Your personal target blood pressure may vary depending on your medical conditions, your age, and other factors. For most people, a normal blood pressure is less than  120/80.  Hypertension is treated with lifestyle changes, medicines, or a combination of both. Lifestyle changes include losing weight, eating a healthy, low-sodium diet, exercising more, and limiting alcohol. This information is not intended to replace advice given to you by your health care provider. Make sure you discuss any questions you have with your health care provider. Document Revised: 04/28/2018 Document Reviewed: 04/28/2018 Elsevier Patient Education  2020 [employer] Inc.      Edwina Barth, MD [facility]

## 2019-10-05 NOTE — Patient Instructions (Addendum)
If you have lab work done today you will be contacted with your lab results within the next 2 weeks.  If you have not heard from [place] then please contact us. The fastest way to get your results is to register for My Chart.   IF you received an x-ray today, you will receive an invoice from [facility]. Please contact [facility] at [phone] with questions or concerns regarding your invoice.   IF you received labwork today, you will receive an invoice from [facility]. Please contact [facility] at [phone] with questions or concerns regarding your invoice.   Our billing staff will not be able to assist you with questions regarding bills from these companies.  You will be contacted with the lab results as soon as they are available. The fastest way to get your results is to activate your My Chart account. Instructions are located on the last page of this paperwork. If you have not heard from [place] regarding the results in 2 weeks, please contact this office.     Bleeding Precautions When on Anticoagulant Therapy, Adult Anticoagulant therapy, also called blood thinner therapy, is medicine that helps to prevent and treat blood clots. The medicine works by stopping blood clots from forming or growing. Blood clots that form in your blood vessels can be dangerous. They can break loose and travel to the heart, lungs, or brain. This increases the risk of a heart attack, stroke, or blocked lung artery (pulmonary embolism). Anticoagulants also increase the risk of bleeding. Try to protect yourself from cuts and other injuries that can cause bleeding. It is important to take anticoagulants exactly as told by your health care provider. Why do I need to be on anticoagulant therapy? You may need this medicine if you are at risk of developing a blood clot. Conditions that increase your risk of a blood clot include:  Being born with heart disease or a heart malformation (congenital heart  disease).  Developing heart disease.  Having had surgery, such as valve replacement.  Having had a serious accident or other type of severe injury (trauma).  Having certain types of cancer.  Having certain diseases that can increase blood clotting.  Having a high risk of stroke or heart attack.  Having atrial fibrillation (AF). What are the common anticoagulant medicines? There are several types of anticoagulant medicines. The most common types are:  Medicines that you take by mouth (oral medicines), such as: ? Warfarin. ? Novel oral anticoagulants (NOACs), such as:  Direct thrombin inhibitors (dabigatran).  Factor Xa inhibitors (apixaban, edoxaban, and rivaroxaban).  Injections, such as: ? Unfractionated heparin. ? Low molecular weight heparin. These anticoagulants work in different ways to prevent blood clots. They also have different risks and side effects. What do I need to remember while on anticoagulant therapy? Taking anticoagulants  Take your medicine at the same time every day. If you forget to take your medicine, take it as soon as you remember. Do not double your dosage of medicine if you miss a whole day. Take your normal dose and call your health care provider.  Do not stop taking your medicine unless your health care provider approves. Stopping the medicine can increase your risk of developing a blood clot. Taking other medicines  Take over-the-counter and prescriptions medicines only as told by your health care provider.  Do not take over-the-counter NSAIDs, including aspirin and ibuprofen, while you are on anticoagulant therapy. These medicines increase your risk of dangerous bleeding.  Get  approval from your health care provider before you start taking any new medicines, vitamins, or herbal products. Some of these could interfere with your therapy. General instructions  Keep all follow-up visits as told by your health care provider. This is  important.  If you are pregnant or trying to get pregnant, talk with a health care provider about anticoagulants. Some of these medicines are not safe to take during pregnancy.  Tell all health care providers, including your dentist, that you are on anticoagulant therapy. It is especially important to tell providers before you have any surgery, medical procedures, or dental work done. What precautions should I take?   Be very careful when using knives, scissors, or other sharp objects.  Use an electric razor instead of a blade.  Do not use toothpicks.  Use a soft-bristled toothbrush. Brush your teeth gently.  Always wear shoes outdoors and wear slippers indoors.  Be careful when cutting your fingernails and toenails.  Place bath mats in the bathroom. If possible, install handrails as well.  Wear gloves while you do yard work.  Wear your seat belt.  Prevent falls by removing loose rugs and extension cords from areas where you walk. Use a cane or walker if you need it.  Avoid constipation by: ? Drinking enough fluid to keep your urine clear or pale yellow. ? Eating foods that are high in fiber, such as fresh fruits and vegetables, whole grains, and beans. ? Limiting foods that are high in fat and processed sugars, such as fried and sweet foods.  Do not play contact sports or participate in other activities that have a high risk for injury. What other precautions are important if on warfarin therapy? If you are taking a type of anticoagulant called warfarin, make sure you:  Work with a diet and nutrition specialist (dietitian) to make an eating plan. Do not make any sudden changes to your diet after you have started your eating plan.  Do not drink alcohol. It can interfere with your medicine and increase your risk of an injury that causes bleeding.  Get regular blood tests as told by your health care provider. What are some questions to ask my health care provider?  Why do I  need anticoagulant therapy?  What is the best anticoagulant therapy for my condition?  How long will I need anticoagulant therapy?  What are the side effects of anticoagulant therapy?  When should I take my medicine? What should I do if I forget to take it?  Will I need to have regular blood tests?  Do I need to change my diet? Are there foods or drinks that I should avoid?  What activities are safe for me?  What should I do if I want to get pregnant? Contact a health care provider if:  You miss a dose of medicine: ? And you are not sure what to do. ? For more than one day.  You have: ? Menstrual bleeding that is heavier than normal. ? Bloody or brown urine. ? Easy bruising. ? Black and tarry stool or bright red stool. ? Side effects from your medicine.  You feel weak or dizzy.  You become pregnant. Get help right away if:  You have bleeding that will not stop within 20 minutes from: ? The nose. ? The gums. ? A cut on the skin.  You have a severe headache or stomachache.  You vomit or cough up blood.  You fall or hit your head. Summary  Anticoagulant  therapy, also called blood thinner therapy, is medicine that helps to prevent and treat blood clots.  Anticoagulants work in different ways to prevent blood clots. They also have different risks and side effects.  Talk with your health care provider about any precautions that you should take while on anticoagulant therapy. This information is not intended to replace advice given to you by your health care provider. Make sure you discuss any questions you have with your health care provider. Document Revised: 12/08/2018 Document Reviewed: 11/04/2016 Elsevier Patient Education  [place].  Hypertension, Adult High blood pressure (hypertension) is when the force of blood pumping through the arteries is too strong. The arteries are the blood vessels that carry blood from the heart throughout the body.  Hypertension forces the heart to work harder to pump blood and may cause arteries to become narrow or stiff. Untreated or uncontrolled hypertension can cause a heart attack, heart failure, a stroke, kidney disease, and other problems. A blood pressure reading consists of a higher number over a lower number. Ideally, your blood pressure should be below 120/80. The first ("top") number is called the systolic pressure. It is a measure of the pressure in your arteries as your heart beats. The second ("bottom") number is called the diastolic pressure. It is a measure of the pressure in your arteries as the heart relaxes. What are the causes? The exact cause of this condition is not known. There are some conditions that result in or are related to high blood pressure. What increases the risk? Some risk factors for high blood pressure are under your control. The following factors may make you more likely to develop this condition:  Smoking.  Having type 2 diabetes mellitus, high cholesterol, or both.  Not getting enough exercise or physical activity.  Being overweight.  Having too much fat, sugar, calories, or salt (sodium) in your diet.  Drinking too much alcohol. Some risk factors for high blood pressure may be difficult or impossible to change. Some of these factors include:  Having chronic kidney disease.  Having a family history of high blood pressure.  Age. Risk increases with age.  Race. You may be at higher risk if you are African American.  Gender. Men are at higher risk than women before age 9. After age 70, women are at higher risk than men.  Having obstructive sleep apnea.  Stress. What are the signs or symptoms? High blood pressure may not cause symptoms. Very high blood pressure (hypertensive crisis) may cause:  Headache.  Anxiety.  Shortness of breath.  Nosebleed.  Nausea and vomiting.  Vision changes.  Severe chest pain.  Seizures. How is this  diagnosed? This condition is diagnosed by measuring your blood pressure while you are seated, with your arm resting on a flat surface, your legs uncrossed, and your feet flat on the floor. The cuff of the blood pressure monitor will be placed directly against the skin of your upper arm at the level of your heart. It should be measured at least twice using the same arm. Certain conditions can cause a difference in blood pressure between your right and left arms. Certain factors can cause blood pressure readings to be lower or higher than normal for a short period of time:  When your blood pressure is higher when you are in a health care provider's office than when you are at home, this is called white coat hypertension. Most people with this condition do not need medicines.  When your  blood pressure is higher at home than when you are in a health care provider's office, this is called masked hypertension. Most people with this condition may need medicines to control blood pressure. If you have a high blood pressure reading during one visit or you have normal blood pressure with other risk factors, you may be asked to:  Return on a different day to have your blood pressure checked again.  Monitor your blood pressure at home for 1 week or longer. If you are diagnosed with hypertension, you may have other blood or imaging tests to help your health care provider understand your overall risk for other conditions. How is this treated? This condition is treated by making healthy lifestyle changes, such as eating healthy foods, exercising more, and reducing your alcohol intake. Your health care provider may prescribe medicine if lifestyle changes are not enough to get your blood pressure under control, and if:  Your systolic blood pressure is above 130.  Your diastolic blood pressure is above 80. Your personal target blood pressure may vary depending on your medical conditions, your age, and other  factors. Follow these instructions at home: Eating and drinking   Eat a diet that is high in fiber and potassium, and low in sodium, added sugar, and fat. An example eating plan is called the DASH (Dietary Approaches to Stop Hypertension) diet. To eat this way: ? Eat plenty of fresh fruits and vegetables. Try to fill one half of your plate at each meal with fruits and vegetables. ? Eat whole grains, such as whole-wheat pasta, brown rice, or whole-grain bread. Fill about one fourth of your plate with whole grains. ? Eat or drink low-fat dairy products, such as skim milk or low-fat yogurt. ? Avoid fatty cuts of meat, processed or cured meats, and poultry with skin. Fill about one fourth of your plate with lean proteins, such as fish, chicken without skin, beans, eggs, or tofu. ? Avoid pre-made and processed foods. These tend to be higher in sodium, added sugar, and fat.  Reduce your daily sodium intake. Most people with hypertension should eat less than 1,500 mg of sodium a day.  Do not drink alcohol if: ? Your health care provider tells you not to drink. ? You are pregnant, may be pregnant, or are planning to become pregnant.  If you drink alcohol: ? Limit how much you use to:  0-1 drink a day for women.  0-2 drinks a day for men. ? Be aware of how much alcohol is in your drink. In the [place] one drink equals one 12 oz bottle of beer (355 mL), one 5 oz glass of wine (148 mL), or one 1 oz glass of hard liquor (44 mL). Lifestyle   Work with your health care provider to maintain a healthy body weight or to lose weight. Ask what an ideal weight is for you.  Get at least 30 minutes of exercise most days of the week. Activities may include walking, swimming, or biking.  Include exercise to strengthen your muscles (resistance exercise), such as Pilates or lifting weights, as part of your weekly exercise routine. Try to do these types of exercises for 30 minutes at least 3 days a week.  Do  not use any products that contain nicotine or tobacco, such as cigarettes, e-cigarettes, and chewing tobacco. If you need help quitting, ask your health care provider.  Monitor your blood pressure at home as told by your health care provider.  Keep all follow-up visits  as told by your health care provider. This is important. Medicines  Take over-the-counter and prescription medicines only as told by your health care provider. Follow directions carefully. Blood pressure medicines must be taken as prescribed.  Do not skip doses of blood pressure medicine. Doing this puts you at risk for problems and can make the medicine less effective.  Ask your health care provider about side effects or reactions to medicines that you should watch for. Contact a health care provider if you:  Think you are having a reaction to a medicine you are taking.  Have headaches that keep coming back (recurring).  Feel dizzy.  Have swelling in your ankles.  Have trouble with your vision. Get help right away if you:  Develop a severe headache or confusion.  Have unusual weakness or numbness.  Feel faint.  Have severe pain in your chest or abdomen.  Vomit repeatedly.  Have trouble breathing. Summary  Hypertension is when the force of blood pumping through your arteries is too strong. If this condition is not controlled, it may put you at risk for serious complications.  Your personal target blood pressure may vary depending on your medical conditions, your age, and other factors. For most people, a normal blood pressure is less than 120/80.  Hypertension is treated with lifestyle changes, medicines, or a combination of both. Lifestyle changes include losing weight, eating a healthy, low-sodium diet, exercising more, and limiting alcohol. This information is not intended to replace advice given to you by your health care provider. Make sure you discuss any questions you have with your health care  provider. Document Revised: 04/28/2018 Document Reviewed: 04/28/2018 Elsevier Patient Education  2020 [employer].

## 2019-10-06 ENCOUNTER — Encounter: Payer: Self-pay | Admitting: Emergency Medicine

## 2019-10-06 LAB — LIPID PANEL
Chol/HDL Ratio: 1.9 ratio (ref 0.0–4.4)
Cholesterol, Total: 173 mg/dL (ref 100–199)
HDL: 92 mg/dL (ref >39)
LDL Chol Calc (NIH): 71 mg/dL (ref 0–99)
Triglycerides: 46 mg/dL (ref 0–149)
VLDL Cholesterol Cal: 10 mg/dL (ref 5–40)

## 2019-10-06 LAB — COMPREHENSIVE METABOLIC PANEL
ALT: 9 IU/L (ref 0–32)
AST: 23 IU/L (ref 0–40)
Albumin/Globulin Ratio: 1.7 (ref 1.2–2.2)
Albumin: 4.3 g/dL (ref 3.7–4.7)
Alkaline Phosphatase: 67 IU/L (ref 39–117)
BUN/Creatinine Ratio: 13 (ref 12–28)
BUN: 12 mg/dL (ref 8–27)
Bilirubin Total: 0.5 mg/dL (ref 0.0–1.2)
CO2: 25 mmol/L (ref 20–29)
Calcium: 9.4 mg/dL (ref 8.7–10.3)
Chloride: 104 mmol/L (ref 96–106)
Creatinine, Ser: 0.93 mg/dL (ref 0.57–1.00)
GFR calc Af Amer: 69 mL/min/{1.73_m2} (ref >59)
GFR calc non Af Amer: 60 mL/min/{1.73_m2} (ref >59)
Globulin, Total: 2.5 g/dL (ref 1.5–4.5)
Glucose: 72 mg/dL (ref 65–99)
Potassium: 3.7 mmol/L (ref 3.5–5.2)
Sodium: 143 mmol/L (ref 134–144)
Total Protein: 6.8 g/dL (ref 6.0–8.5)

## 2019-10-09 ENCOUNTER — Emergency Department (HOSPITAL_COMMUNITY): Payer: Medicare PPO

## 2019-10-09 ENCOUNTER — Emergency Department (HOSPITAL_COMMUNITY)
Admission: EM | Admit: 2019-10-09 | Discharge: 2019-10-09 | Disposition: A | Discharge status: Home / Self Care | Payer: Medicare PPO | Attending: Emergency Medicine | Admitting: Emergency Medicine

## 2019-10-09 DIAGNOSIS — I10 Essential (primary) hypertension: Secondary | ICD-10-CM | POA: Insufficient documentation

## 2019-10-09 DIAGNOSIS — Z7901 Long term (current) use of anticoagulants: Secondary | ICD-10-CM | POA: Diagnosis not present

## 2019-10-09 DIAGNOSIS — Z743 Need for continuous supervision: Secondary | ICD-10-CM | POA: Diagnosis not present

## 2019-10-09 DIAGNOSIS — R0789 Other chest pain: Secondary | ICD-10-CM | POA: Insufficient documentation

## 2019-10-09 DIAGNOSIS — Z79899 Other long term (current) drug therapy: Secondary | ICD-10-CM | POA: Insufficient documentation

## 2019-10-09 DIAGNOSIS — R609 Edema, unspecified: Secondary | ICD-10-CM | POA: Diagnosis not present

## 2019-10-09 DIAGNOSIS — I480 Paroxysmal atrial fibrillation: Secondary | ICD-10-CM | POA: Insufficient documentation

## 2019-10-09 DIAGNOSIS — R600 Localized edema: Secondary | ICD-10-CM | POA: Insufficient documentation

## 2019-10-09 DIAGNOSIS — R079 Chest pain, unspecified: Secondary | ICD-10-CM | POA: Diagnosis not present

## 2019-10-09 LAB — CBC WITH DIFFERENTIAL/PLATELET
Abs Immature Granulocytes: 0.01 10*3/uL (ref 0.00–0.07)
Basophils Absolute: 0 10*3/uL (ref 0.0–0.1)
Basophils Relative: 1 %
Eosinophils Absolute: 0 10*3/uL (ref 0.0–0.5)
Eosinophils Relative: 1 %
HCT: 35.2 % — ABNORMAL LOW (ref 36.0–46.0)
Hemoglobin: 10.8 g/dL — ABNORMAL LOW (ref 12.0–15.0)
Immature Granulocytes: 0 %
Lymphocytes Relative: 30 %
Lymphs Abs: 1.1 10*3/uL (ref 0.7–4.0)
MCH: 25.7 pg — ABNORMAL LOW (ref 26.0–34.0)
MCHC: 30.7 g/dL (ref 30.0–36.0)
MCV: 83.8 fL (ref 80.0–100.0)
Monocytes Absolute: 0.4 10*3/uL (ref 0.1–1.0)
Monocytes Relative: 10 %
Neutro Abs: 2.2 10*3/uL (ref 1.7–7.7)
Neutrophils Relative %: 58 %
Platelets: 199 10*3/uL (ref 150–400)
RBC: 4.2 MIL/uL (ref 3.87–5.11)
RDW: 14.3 % (ref 11.5–15.5)
WBC: 3.7 10*3/uL — ABNORMAL LOW (ref 4.0–10.5)
nRBC: 0 % (ref 0.0–0.2)

## 2019-10-09 LAB — COMPREHENSIVE METABOLIC PANEL
ALT: 18 U/L (ref 0–44)
AST: 20 U/L (ref 15–41)
Albumin: 3.4 g/dL — ABNORMAL LOW (ref 3.5–5.0)
Alkaline Phosphatase: 50 U/L (ref 38–126)
Anion gap: 9 (ref 5–15)
BUN: 10 mg/dL (ref 8–23)
CO2: 25 mmol/L (ref 22–32)
Calcium: 9 mg/dL (ref 8.9–10.3)
Chloride: 107 mmol/L (ref 98–111)
Creatinine, Ser: 0.91 mg/dL (ref 0.44–1.00)
GFR calc Af Amer: >60 mL/min (ref >60)
GFR calc non Af Amer: >60 mL/min (ref >60)
Glucose, Bld: 100 mg/dL — ABNORMAL HIGH (ref 70–99)
Potassium: 4.1 mmol/L (ref 3.5–5.1)
Sodium: 141 mmol/L (ref 135–145)
Total Bilirubin: 0.7 mg/dL (ref 0.3–1.2)
Total Protein: 6 g/dL — ABNORMAL LOW (ref 6.5–8.1)

## 2019-10-09 LAB — BRAIN NATRIURETIC PEPTIDE: B Natriuretic Peptide: 73.3 pg/mL (ref 0.0–100.0)

## 2019-10-09 LAB — LIPASE, BLOOD: Lipase: 26 U/L (ref 11–51)

## 2019-10-09 LAB — TROPONIN I (HIGH SENSITIVITY)
Troponin I (High Sensitivity): <2 ng/L (ref <18)
Troponin I (High Sensitivity): <2 ng/L (ref <18)

## 2019-10-09 MED ORDER — FUROSEMIDE 20 MG PO TABS: 20.0000 mg | ORAL_TABLET | Freq: Every day | ORAL | 0 refills | Status: DC

## 2019-10-09 NOTE — ED Triage Notes (Signed)
Pt BIB [facility] for chest pain that started yesterday. Also has some lower extremity swelling that patient noticed started yesterday. Pt reporting some upper abdomen lower central chest burning and pressure. Pt given 324 of aspirin by EMS and two doses of SL nitro. Pt denying any pain at present time. Pt alert and oriented x4 at present time. Does have a history of afib.

## 2019-10-09 NOTE — ED Provider Notes (Signed)
[facility] EMERGENCY DEPARTMENT Provider Note   CSN: [id number] Arrival date & time: 10/09/19  1010     History Chief Complaint  Patient presents with  . Chest Pain    Donna Simmons is a 79 y.o. female.  The history is provided by the patient, the EMS personnel and medical records. No language interpreter was used.  Chest Pain  Donna Simmons is a 79 y.o. female who presents to the Emergency Department complaining of pain and leg swelling. She presents the emergency department by EMS from home for evaluation of bilateral lower extremity edema that began yesterday as well as central and right right sided chest pain. Her chest pain is described as burning in nature and waxing and waning. She received aspirin and two doses of sublingual nitroglycerin by EMS with resolution of her chest pain. She denies any fevers, chills, shortness of breath, cough, nausea, vomiting, abdominal pain. She has had problems with lower extremity edema in the past. She is compliant with all of her home medications. Symptoms are moderate in nature.      Past Medical History:  Diagnosis Date  . Allergy   . GERD (gastroesophageal reflux disease)   . Heart murmur    No significant valvular lesion noted on echo.  . Hypercholesteremia   . Hypertension   . Paroxysmal atrial fibrillation (HCC) 12/27/2008    Patient Active Problem List   Diagnosis Date Noted  . Other forms of angina pectoris (HCC) 07/25/2018  . Long term current use of anticoagulant therapy 11/18/2012  . Hypercholesteremia 09/25/2012  . DDD (degenerative disc disease), cervical 05/19/2012  . Paroxysmal atrial fibrillation (HCC) - CHA2DS2-VASc Score 3, on Eliquis 04/20/2012  . Essential hypertension 04/20/2012    Past Surgical History:  Procedure Laterality Date  . ABDOMINAL HYSTERECTOMY  1980  . BREAST SURGERY    . CT CTA CORONARY W/CA SCORE W/CM &/OR WO/CM  08/2018   Coronary calcium score 24.  Nonobstructive CAD  with less than 30% plaque in proximal LAD.  Normal aortic root. ->  Relook in October 2020 had significant motion artifact but coronary calcium score 26.  Marland Kitchen EYE SURGERY Right 12/29/2016  . EYE SURGERY Left 01/19/2017  . FRACTURE SURGERY    . LEFT HEART CATH AND CORONARY ANGIOGRAPHY  12/16/2010   no evidence of CAD to explain anginal pain w/ positive troponin.  potential etiology is breakthrough AF  . NM MYOVIEW LTD  April 2010   EF 64%, normal pattern of perfusion in all regions, no scintigraphic evidence of inducible ischemia; no significant wall motion abnormalities; EKG negative for ischemia; no significant change from last study; low risk scan  . TRANSTHORACIC ECHOCARDIOGRAM  07/2018   Normal EF 55-60%.  No RWMA. GR 1 DD. Ao Sclerosis - no Stenosis. Mild AI. Trivial MR.      OB History   No obstetric history on file.     Family History  Problem Relation Age of Onset  . Arthritis Mother        OSTEO  . Hypertension Mother   . Dementia Mother   . Heart disease Father   . Hypertension Sister   . Dementia Sister   . Cancer - Lung Brother   . Cancer Maternal Grandmother   . Heart disease Maternal Grandfather   . Cancer - Lung Brother   . Hypertension Brother   . Cancer - Prostate Brother     Social History   Tobacco Use  . Smoking status:  Never Smoker  . Smokeless tobacco: Never Used  Substance Use Topics  . Alcohol use: No    Alcohol/week: 0.0 standard drinks  . Drug use: No    Home Medications Prior to Admission medications   Medication Sig Start Date End Date Taking? Authorizing Provider  acetaminophen (TYLENOL) 325 MG tablet Take 325-650 mg by mouth daily as needed (pain or headaches).    Yes [provider]  amLODipine (NORVASC) 10 MG tablet Take 10 mg by mouth daily.  09/14/17  Yes [provider]  apixaban (ELIQUIS) 5 MG TABS tablet Take 1 tablet (5 mg total) by mouth 2 (two) times daily. 07/15/19  Yes Marykay Lex, MD  ENSURE (ENSURE) Take  237 mLs by mouth daily.   Yes [provider]  flecainide (TAMBOCOR) 50 MG tablet Take 1 tablet (50 mg total) by mouth 2 (two) times daily. 07/08/19  Yes Marykay Lex, MD  fluticasone [facility]) 50 MCG/ACT nasal spray USE TWO SPRAY(S) IN EACH NOSTRIL ONCE DAILY Patient taking differently: Place 2 sprays into both nostrils daily as needed for allergies.  01/05/17  Yes [name], MD  lisinopril (ZESTRIL) 20 MG tablet Take 1 tablet (20 mg total) by mouth daily. 08/04/19  Yes Marykay Lex, MD  metoprolol succinate (TOPROL-XL) 50 MG 24 hr tablet TAKE 1 TABLET BY MOUTH ONCE DAILY TAKE  WITH  OR  IMMEDIATELY  FOLLOWING  A  MEAL Patient taking differently: Take 50 mg by mouth daily.  09/19/19  Yes Marykay Lex, MD  metoprolol tartrate (LOPRESSOR) 50 MG tablet Take 1 tablet only with flecainide as needed for rapid fast heart rate 08/08/19  Yes Marykay Lex, MD  Multiple Vitamins-Minerals (CENTRUM SILVER 50+WOMEN) TABS Take 1 tablet by mouth daily with lunch.   Yes [provider]  rosuvastatin (CRESTOR) 20 MG tablet Take 1 tablet (20 mg total) by mouth daily. 08/04/19 11/02/19 Yes Marykay Lex, MD  Blood Pressure Monitoring (BLOOD PRESSURE CUFF) MISC Take your  blood pressure with blood pressure cuff/moniotor- can be automatic or manual - one to three  times a week as needed 04/20/19   Marykay Lex, MD  furosemide (LASIX) 20 MG tablet Take 1 tablet (20 mg total) by mouth daily. 10/09/19   Tilden Fossa, MD  memantine [facility]) 5 MG tablet Take 1 tablet daily for one week, then take 1 tablet twice daily for one week, then take 1 tablet in the morning and 2 in the evening for one week, then take 2 tablets twice daily Patient not taking: Reported on 10/09/2019 07/27/19   York Spaniel, MD  sucralfate (CARAFATE) 1 GM/10ML suspension Take 10 mLs (1 g total) by mouth 4 (four) times daily -  with meals and at bedtime. Patient not taking: Reported on 10/09/2019 01/05/18   [name], MD    Allergies    Antihistamines, chlorpheniramine-type  Review of Systems   Review of Systems  Cardiovascular: Positive for chest pain.  All other systems reviewed and are negative.   Physical Exam Updated Vital Signs BP 133/66   Pulse (!) 58   Temp 98.3 F (36.8 C) (Oral)   Resp 20   Ht 5\' 4"  (1.626 m)   Wt 56.7 kg   SpO2 99%   BMI 21.46 kg/m   Physical Exam Vitals and nursing note reviewed.  Constitutional:      Appearance: She is well-developed.  HENT:     Head: Normocephalic and atraumatic.  Cardiovascular:  Rate and Rhythm: Normal rate and regular rhythm.     Heart sounds: No murmur.  Pulmonary:     Effort: Pulmonary effort is normal. No respiratory distress.     Breath sounds: Normal breath sounds.  Abdominal:     Palpations: Abdomen is soft.     Tenderness: There is no abdominal tenderness. There is no guarding or rebound.  Musculoskeletal:        General: No tenderness.     Comments: 2+ pitting edema, 2+ DP pulses bilaterally  Skin:    General: Skin is warm and dry.  Neurological:     Mental Status: She is alert and oriented to person, place, and time.  Psychiatric:        Behavior: Behavior normal.     ED Results / Procedures / Treatments   Labs (all labs ordered are listed, but only abnormal results are displayed) Labs Reviewed  COMPREHENSIVE METABOLIC PANEL - Abnormal; Notable for the following components:      Result Value   Glucose, Bld 100 (*)    Total Protein 6.0 (*)    Albumin 3.4 (*)    All other components within normal limits  CBC WITH DIFFERENTIAL/PLATELET - Abnormal; Notable for the following components:   WBC 3.7 (*)    Hemoglobin 10.8 (*)    HCT 35.2 (*)    MCH 25.7 (*)    All other components within normal limits  LIPASE, BLOOD  BRAIN NATRIURETIC PEPTIDE  TROPONIN I (HIGH SENSITIVITY)  TROPONIN I (HIGH SENSITIVITY)    EKG EKG Interpretation  Date/Time:  Sunday October 09 2019 10:15:45  EST Ventricular Rate:  67 PR Interval:    QRS Duration: 90 QT Interval:  391 QTC Calculation: 413 R Axis:   47 Text Interpretation: Sinus rhythm Atrial premature complex Short PR interval Probable left atrial enlargement Confirmed by Quintella Reichert [phone]) on 10/09/2019 10:19:48 AM   Radiology DG Chest Port 1 View  Result Date: 10/09/2019 CLINICAL DATA:  Chest pain. EXAM: PORTABLE CHEST 1 VIEW COMPARISON:  May 11, 2019. FINDINGS: The heart size and mediastinal contours are within normal limits. Both lungs are clear. No pneumothorax or pleural effusion is noted. The visualized skeletal structures are unremarkable. IMPRESSION: No active disease. Electronically Signed   By: Marijo Conception M.D.   On: 10/09/2019 11:20    Procedures Procedures (including critical care time)  Medications Ordered in ED Medications - No data to display  ED Course  I have reviewed the triage vital signs and the nursing notes.  Pertinent labs & imaging results that were available during my care of the patient were reviewed by me and considered in my medical decision making (see chart for details).    MDM Rules/Calculators/A&P                     Patient with history of atrial fibrillation on eliquis here for evaluation of bilateral lower extremity edema as well as burning chest pain since yesterday. She is pain free on evaluation with no respiratory distress. EKG without acute ischemic changes in troponin is negative times two. No evidence of volume overload. Presentation is not consistent with ACS, PE, dissection. Discussed with patient home care for lower extremity edema, atypical chest pain. Will start short course of Lasix for diuresis. Discussed importance of outpatient follow-up and return precautions. Findings of studies and planned also discussed with patient's husband. Final Clinical Impression(s) / ED Diagnoses Final diagnoses:  Bilateral lower extremity edema  Atypical  chest pain    Rx / DC  Orders ED Discharge Orders         Ordered    furosemide (LASIX) 20 MG tablet  Daily     10/09/19 1510           Tilden Fossa, MD 10/09/19 1549

## 2019-10-09 NOTE — ED Notes (Signed)
Patient verbalizes understanding of discharge instructions. Opportunity for questioning and answers were provided. Armband removed by staff, pt discharged from ED.  

## 2019-10-11 ENCOUNTER — Telehealth: Payer: Self-pay

## 2019-10-11 DIAGNOSIS — R311 Benign essential microscopic hematuria: Secondary | ICD-10-CM | POA: Diagnosis not present

## 2019-10-11 DIAGNOSIS — N3281 Overactive bladder: Secondary | ICD-10-CM | POA: Diagnosis not present

## 2019-10-11 NOTE — Telephone Encounter (Signed)
FYI  [facility] called to inform the practice that the pt. Had been called and no contact was able to be established. Office will hold onto referral for two weeks

## 2019-10-24 DIAGNOSIS — K219 Gastro-esophageal reflux disease without esophagitis: Secondary | ICD-10-CM | POA: Diagnosis not present

## 2019-10-31 ENCOUNTER — Encounter: Payer: Self-pay | Admitting: Emergency Medicine

## 2019-11-01 DIAGNOSIS — D1803 Hemangioma of intra-abdominal structures: Secondary | ICD-10-CM | POA: Diagnosis not present

## 2019-11-01 DIAGNOSIS — R311 Benign essential microscopic hematuria: Secondary | ICD-10-CM | POA: Diagnosis not present

## 2019-11-01 DIAGNOSIS — R3129 Other microscopic hematuria: Secondary | ICD-10-CM | POA: Diagnosis not present

## 2019-11-04 ENCOUNTER — Telehealth: Payer: Self-pay

## 2019-11-04 NOTE — Telephone Encounter (Signed)
Patient husband has made virtual appt to speak with pcp regarding wifes mental health. He asking for referral to specialty doctor to help her. Appt is 11/07/19 at 240

## 2019-11-04 NOTE — Telephone Encounter (Signed)
patient husband called to schedule appt for wife concerning mental stability

## 2019-11-07 ENCOUNTER — Other Ambulatory Visit: Payer: Self-pay

## 2019-11-07 ENCOUNTER — Telehealth (INDEPENDENT_AMBULATORY_CARE_PROVIDER_SITE_OTHER): Payer: Medicare PPO | Admitting: Emergency Medicine

## 2019-11-07 ENCOUNTER — Encounter: Payer: Self-pay | Admitting: Emergency Medicine

## 2019-11-07 VITALS — BP 150/76 | Ht 64.0 in | Wt 120.0 lb

## 2019-11-07 DIAGNOSIS — F0390 Unspecified dementia without behavioral disturbance: Secondary | ICD-10-CM | POA: Diagnosis not present

## 2019-11-07 DIAGNOSIS — R443 Hallucinations, unspecified: Secondary | ICD-10-CM | POA: Diagnosis not present

## 2019-11-07 NOTE — Progress Notes (Signed)
Telemedicine Encounter- SOAP NOTE Established Patient  This telephone encounter was conducted with the patient's (or proxy's) verbal consent via audio telecommunications: yes/no: Yes Patient was instructed to have this encounter in a suitably private space; and to only have persons present to whom they give permission to participate. In addition, patient identity was confirmed by use of name plus two identifiers (DOB and address).  I discussed the limitations, risks, security and privacy concerns of performing an evaluation and management service by telephone and the availability of in person appointments. I also discussed with the patient that there may be a patient responsible charge related to this service. The patient expressed understanding and agreed to proceed.  I spent a total of TIME; 0 MIN TO 60 MIN: 15 minutes talking with the patient or their proxy.  Chief Complaint  Patient presents with  . Hallucinations    x 1 month ago, per pt spouse at night his wife is up walking through the home seeing people, REFERRAL    Subjective   Donna Simmons is a 79 y.o. female established patient. Telephone visit today with her husband who is concerned about patient's behavior over the past 2 months.  She has gone through periods of hallucinations mostly in the evening, almost daily, when she walks through the house seeing people and children that are not there.  No aggressive behavior.  No flulike symptoms.  No new medications.  No new recent traumatic experiences.  No history of dementia yet.  No associated symptoms.  HPI   Patient Active Problem List   Diagnosis Date Noted  . Other forms of angina pectoris (HCC) 07/25/2018  . Long term current use of anticoagulant therapy 11/18/2012  . Hypercholesteremia 09/25/2012  . DDD (degenerative disc disease), cervical 05/19/2012  . Paroxysmal atrial fibrillation (HCC) - CHA2DS2-VASc Score 3, on Eliquis 04/20/2012  . Essential hypertension  04/20/2012    Past Medical History:  Diagnosis Date  . Allergy   . GERD (gastroesophageal reflux disease)   . Heart murmur    No significant valvular lesion noted on echo.  . Hypercholesteremia   . Hypertension   . Paroxysmal atrial fibrillation (HCC) 12/27/2008    Current Outpatient Medications  Medication Sig Dispense Refill  . acetaminophen (TYLENOL) 325 MG tablet Take 325-650 mg by mouth daily as needed (pain or headaches).     Marland Kitchen amLODipine (NORVASC) 10 MG tablet Take 10 mg by mouth daily.     Marland Kitchen apixaban (ELIQUIS) 5 MG TABS tablet Take 1 tablet (5 mg total) by mouth 2 (two) times daily. 180 tablet 1  . ENSURE (ENSURE) Take 237 mLs by mouth daily.    . flecainide (TAMBOCOR) 50 MG tablet Take 1 tablet (50 mg total) by mouth 2 (two) times daily. 180 tablet 3  . fluticasone (FLONASE) 50 MCG/ACT nasal spray USE TWO SPRAY(S) IN EACH NOSTRIL ONCE DAILY (Patient taking differently: Place 2 sprays into both nostrils daily as needed for allergies. ) 16 g 1  . furosemide (LASIX) 20 MG tablet Take 1 tablet (20 mg total) by mouth daily. 5 tablet 0  . lisinopril (ZESTRIL) 20 MG tablet Take 1 tablet (20 mg total) by mouth daily. 90 tablet 3  . memantine (NAMENDA) 5 MG tablet Take 1 tablet daily for one week, then take 1 tablet twice daily for one week, then take 1 tablet in the morning and 2 in the evening for one week, then take 2 tablets twice daily 70 tablet 0  .  metoprolol succinate (TOPROL-XL) 50 MG 24 hr tablet TAKE 1 TABLET BY MOUTH ONCE DAILY TAKE  WITH  OR  IMMEDIATELY  FOLLOWING  A  MEAL (Patient taking differently: Take 50 mg by mouth daily. ) 90 tablet 3  . metoprolol tartrate (LOPRESSOR) 50 MG tablet Take 1 tablet only with flecainide as needed for rapid fast heart rate 10 tablet 6  . Multiple Vitamins-Minerals (CENTRUM SILVER 50+WOMEN) TABS Take 1 tablet by mouth daily with lunch.    . sucralfate (CARAFATE) 1 GM/10ML suspension Take 10 mLs (1 g total) by mouth 4 (four) times daily -   with meals and at bedtime. 420 mL 0  . Blood Pressure Monitoring (BLOOD PRESSURE CUFF) MISC Take your  blood pressure with blood pressure cuff/moniotor- can be automatic or manual - one to three  times a week as needed 1 each 0  . rosuvastatin (CRESTOR) 20 MG tablet Take 1 tablet (20 mg total) by mouth daily. 90 tablet 3   No current facility-administered medications for this visit.    Allergies  Allergen Reactions  . Antihistamines, Chlorpheniramine-Type Palpitations    Makes heart beat fast    Social History   Socioeconomic History  . Marital status: Married    Spouse name: Not on file  . Number of children: Not on file  . Years of education: Not on file  . Highest education level: Not on file  Occupational History  . Not on file  Tobacco Use  . Smoking status: Never Smoker  . Smokeless tobacco: Never Used  Substance and Sexual Activity  . Alcohol use: No    Alcohol/week: 0.0 standard drinks  . Drug use: No  . Sexual activity: Yes  Other Topics Concern  . Not on file  Social History Narrative   Married with no children. Walks several times a week at least 3-4 times a week about 2 miles a time. Does not smoke and does not drink.   Social Determinants of Health   Financial Resource Strain:   . Difficulty of Paying Living Expenses: Not on file  Food Insecurity:   . Worried About Charity fundraiser in the Last Year: Not on file  . Ran Out of Food in the Last Year: Not on file  Transportation Needs:   . Lack of Transportation (Medical): Not on file  . Lack of Transportation (Non-Medical): Not on file  Physical Activity:   . Days of Exercise per Week: Not on file  . Minutes of Exercise per Session: Not on file  Stress:   . Feeling of Stress : Not on file  Social Connections:   . Frequency of Communication with Friends and Family: Not on file  . Frequency of Social Gatherings with Friends and Family: Not on file  . Attends Religious Services: Not on file  . Active  Member of Clubs or Organizations: Not on file  . Attends Archivist Meetings: Not on file  . Marital Status: Not on file  Intimate Partner Violence:   . Fear of Current or Ex-Partner: Not on file  . Emotionally Abused: Not on file  . Physically Abused: Not on file  . Sexually Abused: Not on file    Review of Systems  Constitutional: Negative.  Negative for chills and fever.  HENT: Negative.  Negative for congestion and sore throat.   Respiratory: Negative.  Negative for cough and shortness of breath.   Cardiovascular: Negative.  Negative for chest pain.  Gastrointestinal: Negative.  Negative for  abdominal pain, diarrhea, nausea and vomiting.  Musculoskeletal: Negative for back pain and neck pain.  Skin: Negative.  Negative for rash.  Neurological: Negative.  Negative for dizziness, seizures, loss of consciousness and headaches.  Psychiatric/Behavioral: Positive for hallucinations.  All other systems reviewed and are negative.   Objective   Vitals as reported by the patient: Today's Vitals   11/07/19 1415  BP: (!) 150/76  Weight: 120 lb (54.4 kg)  Height: 5\' 4"  (1.626 m)    There are no diagnoses linked to this encounter. Chivonne was seen today for hallucinations.  Diagnoses and all orders for this visit:  Hallucinations -     Cancel: Ambulatory referral to Neurology -     Ambulatory referral to Neurology  Dementia without behavioral disturbance, unspecified dementia type (HCC) -     Cancel: Ambulatory referral to Neurology -     Ambulatory referral to Neurology     I discussed the assessment and treatment plan with the patient. The patient was provided an opportunity to ask questions and all were answered. The patient agreed with the plan and demonstrated an understanding of the instructions.   The patient was advised to call back or seek an in-person evaluation if the symptoms worsen or if the condition fails to improve as anticipated.  I provided 15  minutes of non-face-to-face time during this encounter.  [name], MD  Primary Care at [facility]

## 2019-11-07 NOTE — Patient Instructions (Signed)
° ° ° °  If you have lab work done today you will be contacted with your lab results within the next 2 weeks.  If you have not heard from us then please contact us. The fastest way to get your results is to register for My Chart. ° ° °IF you received an x-ray today, you will receive an invoice from [facility]. Please contact  Radiology at [phone] with questions or concerns regarding your invoice.  ° °IF you received labwork today, you will receive an invoice from [facility]. Please contact [facility] at [phone] with questions or concerns regarding your invoice.  ° °Our billing staff will not be able to assist you with questions regarding bills from these companies. ° °You will be contacted with the lab results as soon as they are available. The fastest way to get your results is to activate your My Chart account. Instructions are located on the last page of this paperwork. If you have not heard from us regarding the results in 2 weeks, please contact this office. °  ° ° ° °

## 2019-11-11 ENCOUNTER — Encounter: Payer: Self-pay | Admitting: Emergency Medicine

## 2019-11-14 ENCOUNTER — Institutional Professional Consult (permissible substitution): Payer: Medicare PPO | Admitting: Neurology

## 2019-11-14 ENCOUNTER — Telehealth: Payer: Self-pay | Admitting: Neurology

## 2019-11-14 NOTE — Telephone Encounter (Signed)
This patient showed up late for an appointment today.  The patient had to be rescheduled.

## 2019-11-14 NOTE — Telephone Encounter (Signed)
Patient showed up 17 minutes late for appointment today, and we had to reschedule. Patient and husband declined rescheduling and said to cancel the appointment.

## 2019-12-02 IMAGING — CR DG CHEST 2V
2 series · 2 of 2 positions shown · non-contrast
Comparison: PA and lateral chest x-ray June 14, 2017

CLINICAL DATA: Atrial fibrillation, hypertension

EXAM:
CHEST - 2 VIEW

[chest pa]
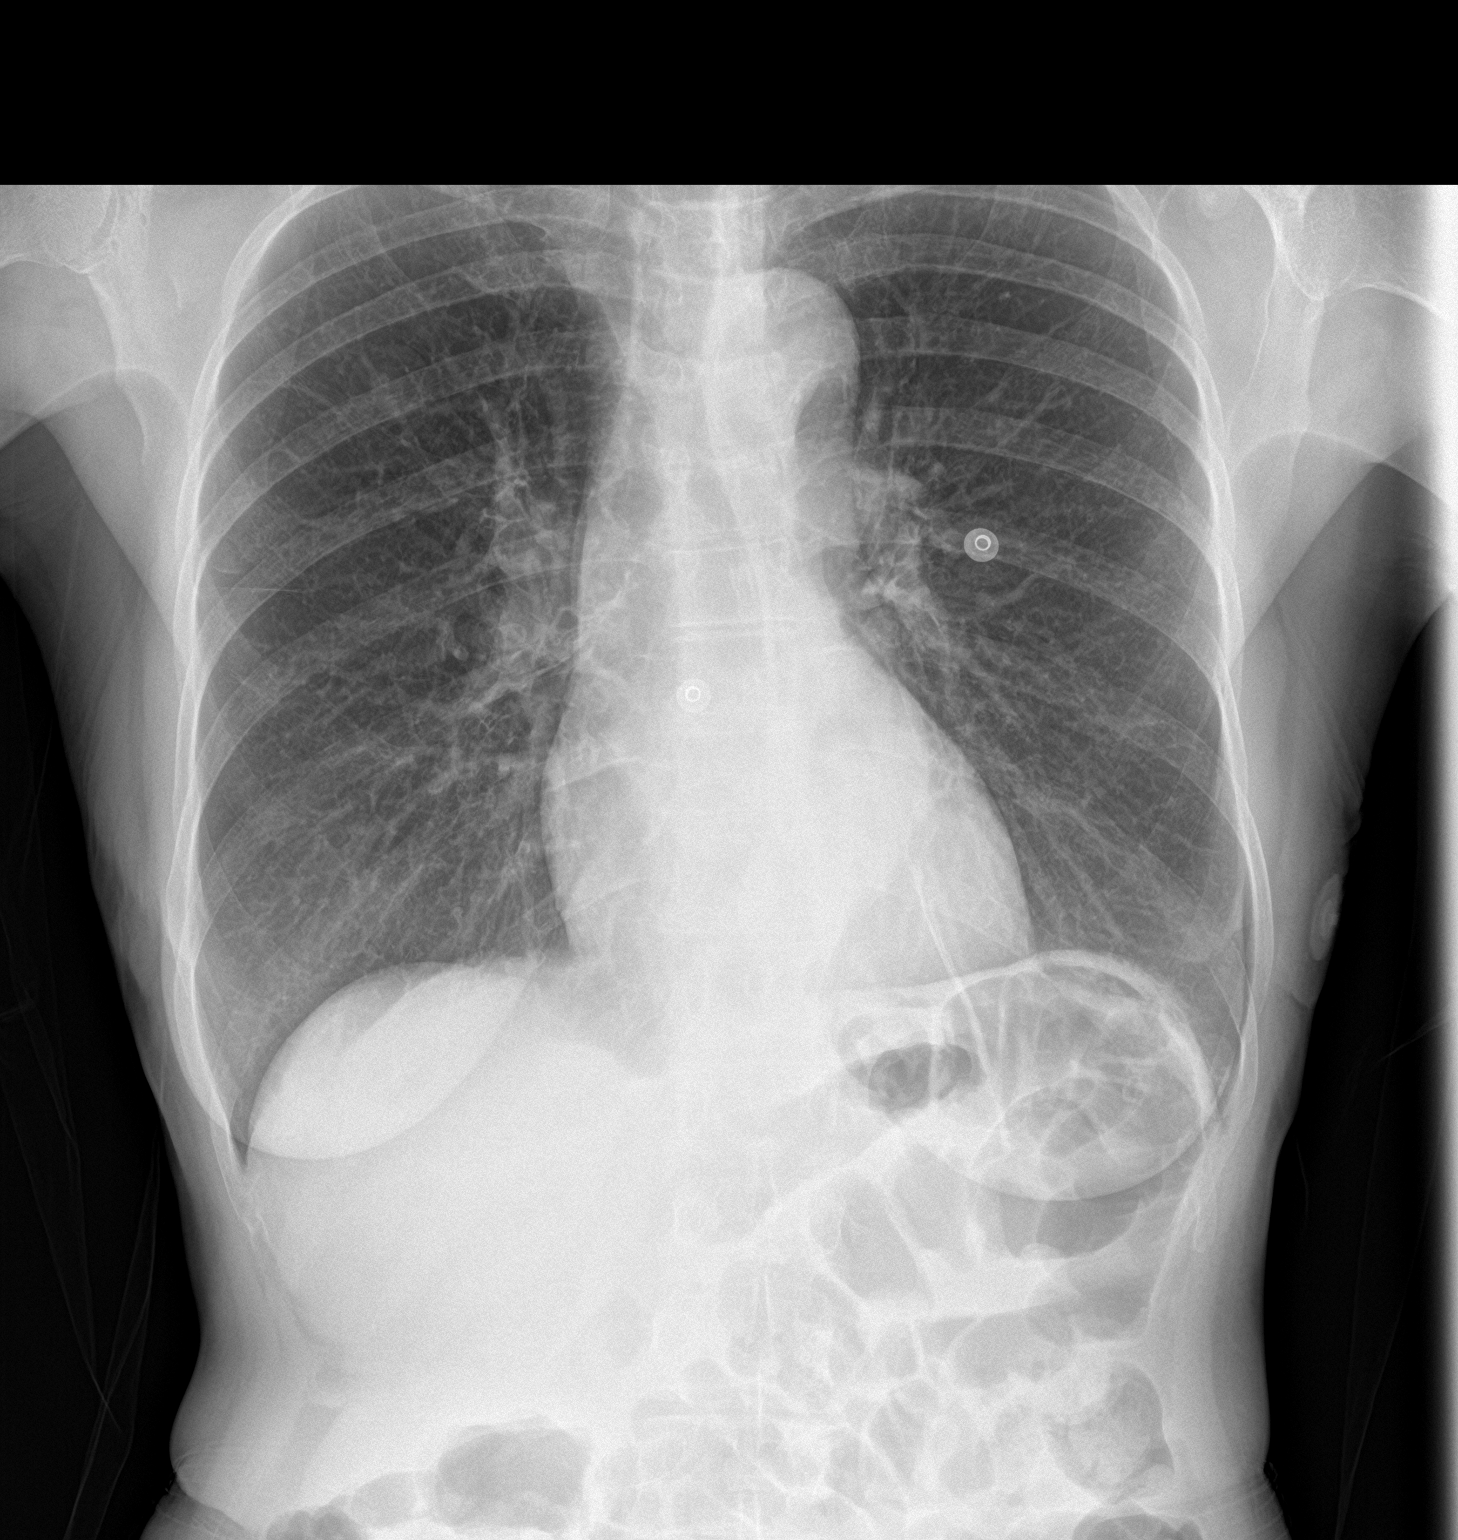

[chest lat]
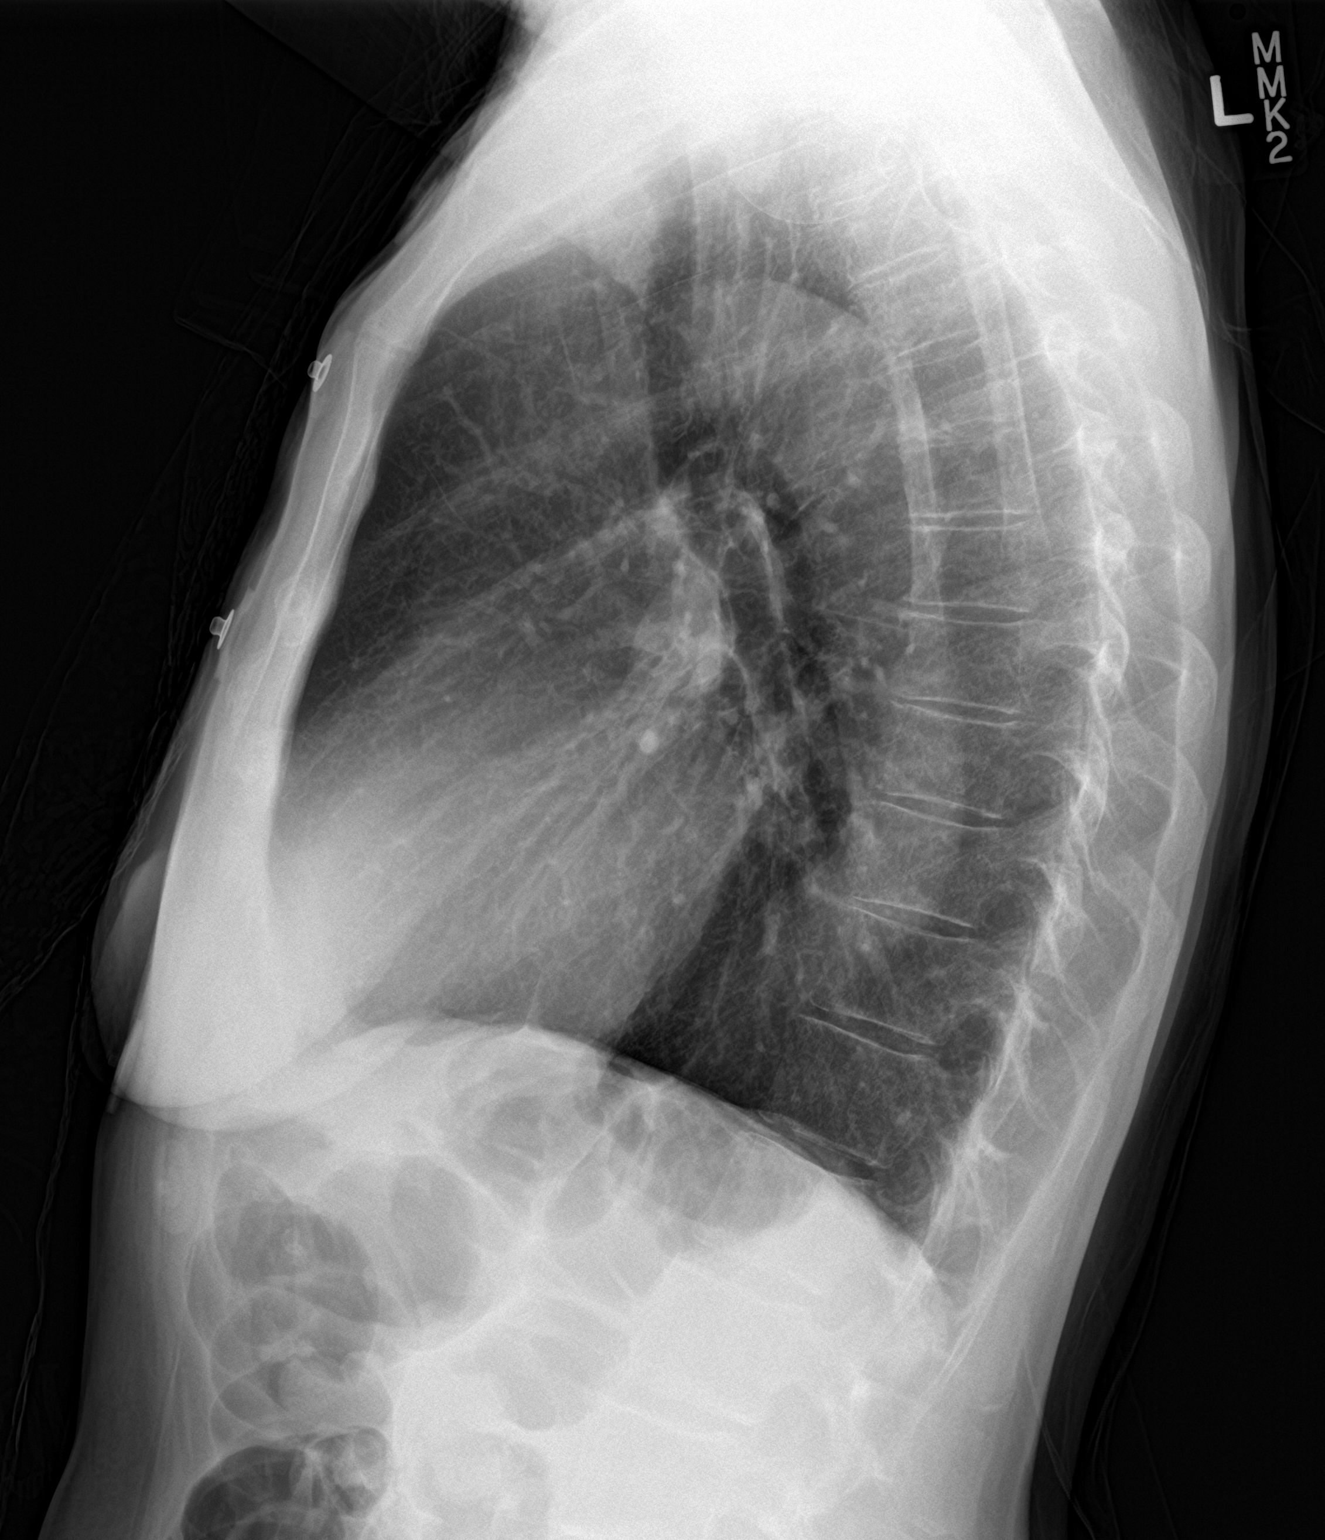

[2 of 2 positions shown; findings below may reference images not displayed]

FINDINGS: The lungs are mildly hyperinflated. There is no focal infiltrate.
There is no pleural effusion. The heart and pulmonary vascularity
are normal. The mediastinum is normal in width. There is
calcification in the wall of the aortic arch. The bony thorax
exhibits no acute abnormality.
IMPRESSION: There is no pneumonia, CHF, nor other acute cardiopulmonary
abnormality.

Thoracic aortic atherosclerosis.

## 2020-01-10 ENCOUNTER — Encounter: Payer: Self-pay | Admitting: Emergency Medicine

## 2020-01-12 NOTE — Telephone Encounter (Signed)
Spoke with Donna Simmons letting him know that I have reached out to [place] at [facility], left message for call bk. Pt is requesting records to be sent. Release form is needed.

## 2020-01-16 ENCOUNTER — Telehealth: Payer: Self-pay

## 2020-01-16 NOTE — Telephone Encounter (Signed)
Sent fax to [facility] asking for a consent be faxed to our office. Husband of the pt has appt on Thursday. Will give him consent for him to complete while here.

## 2020-01-17 ENCOUNTER — Telehealth: Payer: Self-pay

## 2020-01-17 DIAGNOSIS — F0390 Unspecified dementia without behavioral disturbance: Secondary | ICD-10-CM

## 2020-01-17 NOTE — Telephone Encounter (Signed)
Please go ahead and send the referral.  Thanks.

## 2020-01-17 NOTE — Telephone Encounter (Signed)
Erin from [employer] at [facility] called to request that we send a referral to the [facility] in [place] and the last 2 appointment notes in order to get patient started with evaluation for cognitive issues requested by pt's husband. Ph # [phone] fx # [phone] physician's name [facility].

## 2020-01-19 ENCOUNTER — Telehealth: Payer: Self-pay

## 2020-01-19 NOTE — Telephone Encounter (Signed)
Faxed medical to [facility] [phone] per Mr. Sterbenz. Consent hs been scanned to pt chart.

## 2020-01-24 ENCOUNTER — Other Ambulatory Visit: Payer: Self-pay | Admitting: Cardiology

## 2020-01-25 ENCOUNTER — Ambulatory Visit: Payer: Medicare Other | Admitting: Family Medicine

## 2020-01-26 ENCOUNTER — Other Ambulatory Visit: Payer: Self-pay | Admitting: Cardiology

## 2020-02-12 NOTE — Progress Notes (Signed)
Cardiology Office Note   Date:  02/13/2020   ID:  Donna Simmons, Donna Simmons [birth year], MRN [id number]  PCP:  Georgina Quint, MD  Cardiologist:  Dr. Herbie Baltimore No chief complaint on file.    History of Present Illness: Donna Simmons is a 79 y.o. female who presents for ongoing assessment and management of paroxysmal atrial fibrillation, anticoagulated with Eliquis, CHADS VASC Score of 3, frequent PACs, hypercholesterolemia and hypertension.  She had a coronary CTA on 06/15/2019 with significant motion artifact which affected interpretation.  Coronary calcium score was 26 not able to accurately access for plaque because of motion artifact.  Prior CTA in December 2019 was essentially the same however coronary calcium score was at 24, nonobstructive CAD with less than 30% plaque in the proximal LAD was noted.  She was last seen by Dr. Herbie Baltimore on 08/04/2019 at which time she had no complaints of shortness of breath, but did have some breakthrough episodes of irregular heart rate at which time she became slightly concerned as she is symptomatic with this.  She denied excessive bruising, or bleeding issues on Eliquis.  She was walking on the treadmill for exercise as she did not go to gyms or organized exercise groups in the setting of pandemic.  Because of breakthrough episodes, she was continued on Toprol and low-dose flecainide, however she was advised to only take an additional dose of flecainide 50 mg tablet and and 50 mg of Toprol for breakthrough episodes which were sustained longer than 30 minutes.  Due to memory issues, statin therapy was discontinued.  Dr. Herbie Baltimore did not feel as if she needed to have aggressive therapy as she is showing signs of dementia.  However if memory issues returned to normal she could restart statin with rosuvastatin 20 mg at at bedtime.  On follow-up labs of renal function worsened would need to adjust Eliquis dose.  She is here today with her husband who helps her  to answer questions that she continues to have some issues with her memory.  They have been referred by her primary care physician to the [facility] at [facility] and are waiting on their appointment for further assessment of her memory issues and had been referred by her primary care provider Dr. Alvy Bimler.  She offers no complaints of chest pain palpitations and dizziness or fatigue.  She remains as active as she can be at home but has not returned to exercise classes as not all have been open yet.  She is medically compliant.  She complains of lower extremity edema which tends to worsen throughout the day but she finds that her legs are puffy also in the morning.  She has a prescription for Lasix but is not taking it.  Recent labs have been drawn by her primary care physician, through [place] [facility].  We are requesting results.   Past Medical History:  Diagnosis Date  . Allergy   . GERD (gastroesophageal reflux disease)   . Heart murmur    No significant valvular lesion noted on echo.  . Hypercholesteremia   . Hypertension   . Paroxysmal atrial fibrillation (HCC) 12/27/2008    Past Surgical History:  Procedure Laterality Date  . ABDOMINAL HYSTERECTOMY  1980  . BREAST SURGERY    . CT CTA CORONARY W/CA SCORE W/CM &/OR WO/CM  08/2018   Coronary calcium score 24.  Nonobstructive CAD with less than 30% plaque in proximal LAD.  Normal aortic root. ->  Relook in  October 2020 had significant motion artifact but coronary calcium score 26.  Marland Kitchen EYE SURGERY Right 12/29/2016  . EYE SURGERY Left 01/19/2017  . FRACTURE SURGERY    . LEFT HEART CATH AND CORONARY ANGIOGRAPHY  12/16/2010   no evidence of CAD to explain anginal pain w/ positive troponin.  potential etiology is breakthrough AF  . NM MYOVIEW LTD  April 2010   EF 64%, normal pattern of perfusion in all regions, no scintigraphic evidence of inducible ischemia; no significant wall motion abnormalities; EKG negative  for ischemia; no significant change from last study; low risk scan  . TRANSTHORACIC ECHOCARDIOGRAM  07/2018   Normal EF 55-60%.  No RWMA. GR 1 DD. Ao Sclerosis - no Stenosis. Mild AI. Trivial MR.      Current Outpatient Medications  Medication Sig Dispense Refill  . acetaminophen (TYLENOL) 325 MG tablet Take 325-650 mg by mouth daily as needed (pain or headaches).     Marland Kitchen amLODipine (NORVASC) 10 MG tablet Take 10 mg by mouth daily.     . Blood Pressure Monitoring (BLOOD PRESSURE CUFF) MISC Take your  blood pressure with blood pressure cuff/moniotor- can be automatic or manual - one to three  times a week as needed 1 each 0  . ELIQUIS 5 MG TABS tablet Take 1 tablet by mouth twice daily 180 tablet 0  . ENSURE (ENSURE) Take 237 mLs by mouth daily.    . flecainide (TAMBOCOR) 50 MG tablet Take 1 tablet (50 mg total) by mouth 2 (two) times daily. 180 tablet 3  . fluticasone (FLONASE) 50 MCG/ACT nasal spray USE TWO SPRAY(S) IN EACH NOSTRIL ONCE DAILY (Patient taking differently: Place 2 sprays into both nostrils daily as needed for allergies. ) 16 g 1  . furosemide (LASIX) 20 MG tablet Take 1 tablet by mouth once weekly. 30 tablet 0  . lisinopril (ZESTRIL) 20 MG tablet Take 1 tablet (20 mg total) by mouth daily. 90 tablet 3  . memantine (NAMENDA) 5 MG tablet Take 1 tablet daily for one week, then take 1 tablet twice daily for one week, then take 1 tablet in the morning and 2 in the evening for one week, then take 2 tablets twice daily 70 tablet 0  . metoprolol succinate (TOPROL-XL) 50 MG 24 hr tablet TAKE 1 TABLET BY MOUTH ONCE DAILY TAKE  WITH  OR  IMMEDIATELY  FOLLOWING  A  MEAL (Patient taking differently: Take 50 mg by mouth daily. ) 90 tablet 3  . metoprolol tartrate (LOPRESSOR) 50 MG tablet Take 1 tablet only with flecainide as needed for rapid fast heart rate 10 tablet 6  . Multiple Vitamins-Minerals (CENTRUM SILVER 50+WOMEN) TABS Take 1 tablet by mouth daily with lunch.    Marland Kitchen omeprazole (PRILOSEC)  40 MG capsule     . sucralfate (CARAFATE) 1 GM/10ML suspension Take 10 mLs (1 g total) by mouth 4 (four) times daily -  with meals and at bedtime. 420 mL 0  . rosuvastatin (CRESTOR) 20 MG tablet Take 1 tablet (20 mg total) by mouth daily. 90 tablet 3   No current facility-administered medications for this visit.    Allergies:   Antihistamines, chlorpheniramine-type    Social History:  The patient  reports that she has never smoked. She has never used smokeless tobacco. She reports that she does not drink alcohol and does not use drugs.   Family History:  The patient's family history includes Arthritis in her mother; Cancer in her maternal grandmother; Cancer - Lung in  her brother and brother; Cancer - Prostate in her brother; Dementia in her mother and sister; Heart disease in her father and maternal grandfather; Hypertension in her brother, mother, and sister.    ROS: All other systems are reviewed and negative. Unless otherwise mentioned in H&P    PHYSICAL EXAM: VS:  BP 128/70   Pulse 61   Ht 5' (1.524 m)   Wt 112 lb 6.4 oz (51 kg)   SpO2 94%   BMI 21.95 kg/m  , BMI Body mass index is 21.95 kg/m. GEN: Well nourished, well developed, in no acute distress, frail. HEENT: normal Neck: no JVD, carotid bruits, or masses Cardiac: RRR; no appreciable murmurs, rubs, or gallops, 2+ pitting dependent bilateral lower extremity edema  Respiratory:  Clear to auscultation bilaterally, normal work of breathing GI: soft, nontender, nondistended, + BS MS: no deformity or atrophy Skin: warm and dry, no rash Neuro:  Strength and sensation are intact Psych: euthymic mood, full affect, some issues with memory.   EKG: Normal sinus rhythm heart rate of 61 bpm (personally reviewed).  Recent Labs: 10/09/2019: ALT 18; B Natriuretic Peptide 73.3; BUN 10; Creatinine, Ser 0.91; Hemoglobin 10.8; Platelets 199; Potassium 4.1; Sodium 141    Lipid Panel    Component Value Date/Time   CHOL 173  10/05/2019 1020   TRIG 46 10/05/2019 1020   HDL 92 10/05/2019 1020   CHOLHDL 1.9 10/05/2019 1020   CHOLHDL 2.7 08/29/2015 0910   VLDL 20 08/29/2015 0910   LDLCALC 71 10/05/2019 1020      Wt Readings from Last 3 Encounters:  02/13/20 112 lb 6.4 oz (51 kg)  11/07/19 120 lb (54.4 kg)  10/09/19 125 lb (56.7 kg)      Other studies Reviewed: Echocardiogram 09-Aug-2018 Left ventricle: The cavity size was normal. Wall thickness was  normal. Systolic function was normal. The estimated ejection  fraction was in the range of 55% to 60%. Wall motion was normal;  there were no regional wall motion abnormalities. Doppler  parameters are consistent with abnormal left ventricular  relaxation (grade 1 diastolic dysfunction). The E/e&' ratio is  >15, suggesting elevated LV filling pressure.  - Aortic valve: Trileaflet. Sclerosis without stenosis. There was  mild regurgitation.  - Mitral valve: Mildly thickened leaflets . There was trivial  regurgitation.  - Left atrium: The atrium was normal in size.  - Tricuspid valve: There was trivial regurgitation.  - Pulmonary arteries: PA peak pressure: 19 mm Hg (S).  - Inferior vena cava: The vessel was normal in size. The  respirophasic diameter changes were in the normal range (>= 50%),  consistent with normal central venous pressure.  Coronary CTA 06/2019 IMPRESSION: 1. Significant artifact due to both respiratory motion and cardiac motion from irregular rhythm. This artifact prevents interpretation of a segment of the proximal LAD, proximal LCX, and mid RCA. In the segments that are interpretable, minimal plaque is seen, with calcified plaque in the ostial LCX causing 0-24% stenosis  2. Coronary calcium score of 26. This was 63 percentile for age and sex matched control.  3.  Normal coronary origin with right dominance.  ASSESSMENT AND PLAN:  1.  PAF: EKG today reveals normal sinus rhythm.  She continues on flecainide  and metoprolol.  She has not had to take any extra doses for rapid heart rhythm or irregular heart rhythm.  She remains on Eliquis 5 mg twice daily.  Denies any bleeding issues.  Will request labs from her primary care physician to evaluate  her kidney function.  No changes in her regimen at this time  2.  Hypertension: Blood pressures well controlled currently on amlodipine 10 mg daily along with metoprolol and lisinopril.  In fact it is a little soft today.  May need to reduce dose but she is not symptomatic with dizziness lightheadedness or near syncope.  3.  Lower extremity edema: I have advised her to use Lasix about once a week to help with the lower extremity edema as she does have evidence of some fluid overload/dependent edema which can be uncomfortable for her.  Her shoes are tight.  I have explained to her that the amlodipine is likely contributing to this.  However she states that they are swollen with her feet elevated as well.  Therefore she may take 20 mg of Lasix once a week as needed for fluid retention in the lower extremities.  If this becomes more of an issue we may need to have her take it more often but will need to decrease amlodipine to prevent hypotension.  They will call us if the symptoms begin or if her blood pressure lowers.  4.  Memory loss: We have reviewed the records and she has been referred to the [facility], but primary care physician has forms to fill out for them to include the last office visits etc. which they are still waiting on.  I have advised him to contact her primary care physician to find out where they are in the process as they are concerned about being missed concerning getting appointment made.  Her husband will be doing so today.   Current medicines are reviewed at length with the patient today.  I have spent 25 minutes dedicated to the care of this patient on the date of this encounter to include pre-visit review of records, assessment,  management and diagnostic testing,with shared decision making.  Labs/ tests ordered today include: None Requesting labs.   Phill Myron. West Pugh, ANP, AACC   02/13/2020 2:49 PM    [facility] [place] [address] Office [phone] Fax [phone]  Notice: This dictation was prepared with Dragon dictation along with smaller phrase technology. Any transcriptional errors that result from this process are unintentional and may not be corrected upon review.

## 2020-02-13 ENCOUNTER — Other Ambulatory Visit: Payer: Self-pay

## 2020-02-13 ENCOUNTER — Ambulatory Visit (INDEPENDENT_AMBULATORY_CARE_PROVIDER_SITE_OTHER): Payer: Medicare PPO | Admitting: Adult Health

## 2020-02-13 ENCOUNTER — Encounter: Payer: Self-pay | Admitting: Adult Health

## 2020-02-13 VITALS — BP 128/70 | HR 61 | Ht 60.0 in | Wt 112.4 lb

## 2020-02-13 DIAGNOSIS — R600 Localized edema: Secondary | ICD-10-CM | POA: Diagnosis not present

## 2020-02-13 DIAGNOSIS — I10 Essential (primary) hypertension: Secondary | ICD-10-CM

## 2020-02-13 DIAGNOSIS — I480 Paroxysmal atrial fibrillation: Secondary | ICD-10-CM | POA: Diagnosis not present

## 2020-02-13 DIAGNOSIS — R413 Other amnesia: Secondary | ICD-10-CM | POA: Diagnosis not present

## 2020-02-13 MED ORDER — FUROSEMIDE 20 MG PO TABS: ORAL_TABLET | ORAL | 0 refills | Status: DC

## 2020-02-13 NOTE — Patient Instructions (Signed)
Medication Instructions:  Your physician recommends that you continue on your current medications as directed. Please refer to the Current Medication list given to you today.  Take Furosemide 20 mg---take one tablet by mouth once weekly.  *If you need a refill on your cardiac medications before your next appointment, please call your pharmacy*   Follow-Up: At [facility], you and your health needs are our priority.  As part of our continuing mission to provide you with exceptional heart care, we have created designated Provider Care Teams.  These Care Teams include your primary Cardiologist (physician) and Advanced Practice Providers (APPs -  Physician Assistants and Nurse Practitioners) who all work together to provide you with the care you need, when you need it.  We recommend signing up for the patient portal called "MyChart".  Sign up information is provided on this After Visit Summary.  MyChart is used to connect with patients for Virtual Visits (Telemedicine).  Patients are able to view lab/test results, encounter notes, upcoming appointments, etc.  Non-urgent messages can be sent to your provider as well.   To learn more about what you can do with MyChart, go to [email or web address].    Your next appointment:   6 month(s)  The format for your next appointment:   In Person  Provider:   You may see Bryan Lemma, MD or one of the following Advanced Practice Providers on your designated Care Team:    Theodore Demark, PA-C  Joni Reining, DNP, ANP  Cadence Fransico Michael, NP    Other Instructions Please call our office 2 months in advance to schedule your follow-up appointment with Dr. Herbie Baltimore.

## 2020-03-07 NOTE — Telephone Encounter (Signed)
Jodelle Gross, NP  You 1 hour ago (7:07 AM)   Not sure. May need to call them and ask for a more timely appointment on their behalf, or have the patient contact PCP to help with this too. Not sure how we can expedite this otherwise.   KL

## 2020-03-07 NOTE — Telephone Encounter (Signed)
Call made to [facility] Provided update that Samara Deist NP talked with patient about dementia assessment and referral and that wanted me to call on patient's behalf. Provided update that patient's husband had called back in March 2021 to this clinic to ask for evaluation and that PCP has also assessed patient for dementia care - noted hallucinations in 11/07/2019 Sagardia MD  They will contact patient if there is sooner availability

## 2020-03-08 ENCOUNTER — Telehealth: Payer: Self-pay | Admitting: Adult Health

## 2020-03-08 NOTE — Telephone Encounter (Signed)
Denny Peon from [facility]) returning call in regard to mychart message 03/06/2020. She states she was not able to get a sooner appointment for the patient, but she was put on the wait list. She states the patient's husband is aware of this.

## 2020-03-08 NOTE — Telephone Encounter (Signed)
Noted will notify Samara Deist of this since they were [place] back and forth.   Thanks!

## 2020-03-09 NOTE — Telephone Encounter (Signed)
Thank you for letting me know.   Donna Simmons

## 2020-04-04 ENCOUNTER — Ambulatory Visit: Payer: Medicare PPO | Admitting: Emergency Medicine

## 2020-04-04 ENCOUNTER — Encounter: Payer: Self-pay | Admitting: Emergency Medicine

## 2020-04-04 ENCOUNTER — Other Ambulatory Visit: Payer: Self-pay

## 2020-04-04 ENCOUNTER — Telehealth: Payer: Self-pay | Admitting: *Deleted

## 2020-04-04 VITALS — BP 115/68 | HR 63 | Temp 98.2°F | Ht 63.0 in | Wt 108.8 lb

## 2020-04-04 DIAGNOSIS — R413 Other amnesia: Secondary | ICD-10-CM | POA: Diagnosis not present

## 2020-04-04 DIAGNOSIS — Z7901 Long term (current) use of anticoagulants: Secondary | ICD-10-CM

## 2020-04-04 DIAGNOSIS — F0390 Unspecified dementia without behavioral disturbance: Secondary | ICD-10-CM

## 2020-04-04 DIAGNOSIS — I208 Other forms of angina pectoris: Secondary | ICD-10-CM

## 2020-04-04 DIAGNOSIS — R443 Hallucinations, unspecified: Secondary | ICD-10-CM | POA: Diagnosis not present

## 2020-04-04 DIAGNOSIS — I480 Paroxysmal atrial fibrillation: Secondary | ICD-10-CM

## 2020-04-04 NOTE — Telephone Encounter (Signed)
Called today left message in voice mail at [facility] Neurological referral to call back concerning referral 11/07/2019. I left message for Kathie Rhodes or Lowella Bandy in referral, because I wanted to check on the referral sent 11/07/2019 for hallucination and dementia, it looks like the referral was not completed.

## 2020-04-04 NOTE — Telephone Encounter (Signed)
I have called [facility] and spoke to a new Software engineer. Pt is scheduled for Geriatric Memory clinic and she will send a message to the provider to see if they are willing to see the patient sooner.

## 2020-04-04 NOTE — Progress Notes (Signed)
[name] 79 y.o.   Chief Complaint  Patient presents with  . Medical Management of Chronic Issues    6 m f/u and has memory issues    HISTORY OF PRESENT ILLNESS: This is a 79 y.o. female complaining of memory issues and hallucinations since last March. Telemedicine visit with me on 11/07/2019.  Referred to neurology then but not scheduled yet. No other complaints or medical concerns today.  Here with her husband today.  No frequent falls. Has history of hypertension and chronic A. fib on long-term anticoagulation.  HPI   Prior to Admission medications   Medication Sig Start Date End Date Taking? Authorizing Provider  acetaminophen (TYLENOL) 325 MG tablet Take 325-650 mg by mouth daily as needed (pain or headaches).    Yes [provider]  amLODipine (NORVASC) 10 MG tablet Take 10 mg by mouth daily.  09/14/17  Yes [provider]  Blood Pressure Monitoring (BLOOD PRESSURE CUFF) MISC Take your  blood pressure with blood pressure cuff/moniotor- can be automatic or manual - one to three  times a week as needed 04/20/19  Yes Leonie Man, MD  ELIQUIS 5 MG TABS tablet Take 1 tablet by mouth twice daily 01/26/20  Yes Leonie Man, MD  ENSURE (ENSURE) Take 237 mLs by mouth daily.   Yes [provider]  flecainide (TAMBOCOR) 50 MG tablet Take 1 tablet (50 mg total) by mouth 2 (two) times daily. 07/08/19  Yes Leonie Man, MD  fluticasone [facility]) 50 MCG/ACT nasal spray USE TWO SPRAY(S) IN EACH NOSTRIL ONCE DAILY Patient taking differently: Place 2 sprays into both nostrils daily as needed for allergies.  01/05/17  Yes Wendie Agreste, MD  furosemide (LASIX) 20 MG tablet Take 1 tablet by mouth once weekly. 02/13/20  Yes Lendon Colonel, NP  lisinopril (ZESTRIL) 20 MG tablet Take 1 tablet (20 mg total) by mouth daily. 08/04/19  Yes Leonie Man, MD  mem[facility]) 5 MG tablet Take 1 tablet daily for one week, then take 1 tablet twice daily for  one week, then take 1 tablet in the morning and 2 in the evening for one week, then take 2 tablets twice daily 07/27/19  Yes Kathrynn Ducking, MD  metoprolol succinate (TOPROL-XL) 50 MG 24 hr tablet TAKE 1 TABLET BY MOUTH ONCE DAILY TAKE  WITH  OR  IMMEDIATELY  FOLLOWING  A  MEAL Patient taking differently: Take 50 mg by mouth daily.  09/19/19  Yes Leonie Man, MD  metoprolol tartrate (LOPRESSOR) 50 MG tablet Take 1 tablet only with flecainide as needed for rapid fast heart rate 08/08/19  Yes Leonie Man, MD  Multiple Vitamins-Minerals (CENTRUM SILVER 50+WOMEN) TABS Take 1 tablet by mouth daily with lunch.   Yes [provider]  omeprazole (PRILOSEC) 40 MG capsule  01/23/20  Yes [provider]  rosuvastatin (CRESTOR) 20 MG tablet Take 1 tablet (20 mg total) by mouth daily. 08/04/19 04/04/20 Yes Leonie Man, MD  sucralfate (CARAFATE) 1 GM/10ML suspension Take 10 mLs (1 g total) by mouth 4 (four) times daily -  with meals and at bedtime. 01/05/18  Yes Wendie Agreste, MD    Allergies  Allergen Reactions  . Antihistamines, Chlorpheniramine-Type Palpitations    Makes heart beat fast    Patient Active Problem List   Diagnosis Date Noted  . Other forms of angina pectoris ([place]) 07/25/2018  . Long term current use of anticoagulant therapy 11/18/2012  . Hypercholesteremia  09/25/2012  . DDD (degenerative disc disease), cervical 05/19/2012  . Paroxysmal atrial fibrillation ([facility]) - CHA2DS2-VASc Score 3, on Eliquis 04/20/2012  . Essential hypertension 04/20/2012    Past Medical History:  Diagnosis Date  . Allergy   . GERD (gastroesophageal reflux disease)   . Heart murmur    No significant valvular lesion noted on echo.  . Hypercholesteremia   . Hypertension   . Paroxysmal atrial fibrillation ([place]) 12/27/2008    Past Surgical History:  Procedure Laterality Date  . ABDOMINAL HYSTERECTOMY  1980  . BREAST SURGERY    . CT CTA CORONARY W/CA SCORE W/CM &/OR WO/CM   08/2018   Coronary calcium score 24.  Nonobstructive CAD with less than 30% plaque in proximal LAD.  Normal aortic root. ->  Relook in October 2020 had significant motion artifact but coronary calcium score 26.  Marland Kitchen EYE SURGERY Right 12/29/2016  . EYE SURGERY Left 01/19/2017  . FRACTURE SURGERY    . LEFT HEART CATH AND CORONARY ANGIOGRAPHY  12/16/2010   no evidence of CAD to explain anginal pain w/ positive troponin.  potential etiology is breakthrough AF  . NM MYOVIEW LTD  April 2010   EF 64%, normal pattern of perfusion in all regions, no scintigraphic evidence of inducible ischemia; no significant wall motion abnormalities; EKG negative for ischemia; no significant change from last study; low risk scan  . TRANSTHORACIC ECHOCARDIOGRAM  07/2018   Normal EF 55-60%.  No RWMA. GR 1 DD. Ao Sclerosis - no Stenosis. Mild AI. Trivial MR.     Social History   Socioeconomic History  . Marital status: Married    Spouse name: Not on file  . Number of children: Not on file  . Years of education: Not on file  . Highest education level: Not on file  Occupational History  . Not on file  Tobacco Use  . Smoking status: Never Smoker  . Smokeless tobacco: Never Used  Substance and Sexual Activity  . Alcohol use: No    Alcohol/week: 0.0 standard drinks  . Drug use: No  . Sexual activity: Yes  Other Topics Concern  . Not on file  Social History Narrative   Married with no children. Walks several times a week at least 3-4 times a week about 2 miles a time. Does not smoke and does not drink.   Social Determinants of Health   Financial Resource Strain:   . Difficulty of Paying Living Expenses:   Food Insecurity:   . Worried About Charity fundraiser in the Last Year:   . Arboriculturist in the Last Year:   Transportation Needs:   . Film/video editor (Medical):   Marland Kitchen Lack of Transportation (Non-Medical):   Physical Activity:   . Days of Exercise per Week:   . Minutes of Exercise per Session:    Stress:   . Feeling of Stress :   Social Connections:   . Frequency of Communication with Friends and Family:   . Frequency of Social Gatherings with Friends and Family:   . Attends Religious Services:   . Active Member of Clubs or Organizations:   . Attends Archivist Meetings:   Marland Kitchen Marital Status:   Intimate Partner Violence:   . Fear of Current or Ex-Partner:   . Emotionally Abused:   Marland Kitchen Physically Abused:   . Sexually Abused:     Family History  Problem Relation Age of Onset  . Arthritis Mother  OSTEO  . Hypertension Mother   . Dementia Mother   . Heart disease Father   . Hypertension Sister   . Dementia Sister   . Cancer - Lung Brother   . Cancer Maternal Grandmother   . Heart disease Maternal Grandfather   . Cancer - Lung Brother   . Hypertension Brother   . Cancer - Prostate Brother      Review of Systems  Constitutional: Negative.  Negative for chills and fever.  HENT: Negative.  Negative for congestion and sore throat.   Eyes: Negative.   Respiratory: Negative.  Negative for cough and shortness of breath.   Cardiovascular: Negative.  Negative for chest pain and palpitations.  Gastrointestinal: Negative.  Negative for abdominal pain, diarrhea, nausea and vomiting.  Genitourinary: Negative.  Negative for dysuria and hematuria.  Musculoskeletal: Negative.  Negative for back pain, myalgias and neck pain.  Skin: Negative.  Negative for rash.  Neurological: Negative.  Negative for dizziness and headaches.  Endo/Heme/Allergies: Negative.   All other systems reviewed and are negative.  Today's Vitals   04/04/20 1018  BP: 115/68  Pulse: 63  Temp: 98.2 F (36.8 C)  TempSrc: Temporal  SpO2: 100%  Weight: 108 lb 12.8 oz (49.4 kg)  Height: 5' 3"  (1.6 m)   Body mass index is 19.27 kg/m.   Physical Exam Vitals reviewed.  Constitutional:      Appearance: Normal appearance.  HENT:     Head: Normocephalic.     Right Ear: Tympanic membrane,  ear canal and external ear normal.     Left Ear: Tympanic membrane, ear canal and external ear normal.     Mouth/Throat:     Mouth: Mucous membranes are moist.     Pharynx: Oropharynx is clear.  Eyes:     Extraocular Movements: Extraocular movements intact.     Conjunctiva/sclera: Conjunctivae normal.     Pupils: Pupils are equal, round, and reactive to light.  Cardiovascular:     Rate and Rhythm: Normal rate and regular rhythm.     Pulses: Normal pulses.     Heart sounds: Normal heart sounds.  Pulmonary:     Effort: Pulmonary effort is normal.     Breath sounds: Normal breath sounds.  Abdominal:     Palpations: Abdomen is soft.     Tenderness: There is no abdominal tenderness.  Musculoskeletal:        General: Normal range of motion.     Cervical back: Normal range of motion and neck supple.     Right lower leg: No edema.     Left lower leg: No edema.  Skin:    General: Skin is warm and dry.     Capillary Refill: Capillary refill takes less than 2 seconds.  Neurological:     General: No focal deficit present.     Mental Status: She is alert and oriented to person, place, and time.  Psychiatric:        Mood and Affect: Mood normal.        Behavior: Behavior normal.      ASSESSMENT & PLAN: Breuna was seen today for medical management of chronic issues.  Diagnoses and all orders for this visit:  Senile dementia without behavioral disturbance ([place]) -     Ambulatory referral to Neurology  Long term current use of anticoagulant therapy  Paroxysmal atrial fibrillation ([place])  Other forms of angina pectoris ([place])  Memory loss -     CMP14+EGFR -     CBC  with Differential/Platelet -     Comprehensive metabolic panel -     Hemoglobin A1c -     TSH -     Vitamin B12 -     Lipid panel -     Ambulatory referral to Neurology  Hallucinations -     Ambulatory referral to Neurology    Patient Instructions       If you have lab work done today you will be contacted  with your lab results within the next 2 weeks.  If you have not heard from [place] then please contact us. The fastest way to get your results is to register for My Chart.   IF you received an x-ray today, you will receive an invoice from [facility]. Please contact [facility] at [phone] with questions or concerns regarding your invoice.   IF you received labwork today, you will receive an invoice from [place]. Please contact [facility] at [phone] with questions or concerns regarding your invoice.   Our billing staff will not be able to assist you with questions regarding bills from these companies.  You will be contacted with the lab results as soon as they are available. The fastest way to get your results is to activate your My Chart account. Instructions are located on the last page of this paperwork. If you have not heard from [place] regarding the results in 2 weeks, please contact this office.      Dementia Dementia is a condition that affects the way the brain works. It often affects memory and thinking. There are many types of dementia. Some types get worse with time and cannot be reversed. Some types of dementia include:  Alzheimer's disease. This is the most common type.  Vascular dementia. This type may happen due to a stroke.  Lewy body dementia. This type may happen to people who have Parkinson's disease.  Frontotemporal dementia. This type is caused by damage to nerve cells in certain parts of the brain. Some people may have more than one type, and this is called mixed dementia. What are the causes? This condition is caused by damage to cells in the brain. Some causes that cannot be reversed include:  Having a condition that affects the blood vessels of the brain, such as diabetes, heart disease, or blood vessel disease.  Changes to genes. Some causes that can be reversed or slowed include:  Injury to the brain.  Certain  medicines.  Infection.  Not having enough vitamin B12 in the body, or thyroid problems.  A tumor or blood clot in the brain. What are the signs or symptoms? Symptoms depend on the type of dementia. This may include:  Problems remembering things.  Having trouble taking a bath or putting clothes on.  Forgetting appointments.  Forgetting to pay bills.  Trouble planning and making meals.  Having trouble speaking.  Getting lost easily. How is this treated? Treatment depends on the cause of the dementia. It might include taking medicines that help:  To control the dementia.  To slow down the dementia.  To manage symptoms. In some cases, treating the cause of your dementia can improve symptoms, reverse symptoms, or slow down how quickly it gets worse. Your doctor can help you find support groups and other doctors who can help with your care. Follow these instructions at home: Medicines  Take over-the-counter and prescription medicines only as told by your doctor.  Use a pill organizer to help you manage your medicines.  Avoidtaking medicines for  pain or for sleep. Lifestyle  Make healthy choices: ? Be active as told by your doctor. ? Do not use any products that contain nicotine or tobacco, such as cigarettes, e-cigarettes, and chewing tobacco. If you need help quitting, ask your doctor. ? Do not drink alcohol. ? When you get stressed, do something that will help you to relax. Your doctor can give you tips. ? Spend time with other people.  Make sure you get good sleep. To get good sleep: ? Try not to take naps during the day. ? Keep your bedroom dark and cool. ? In the few hours before you go to bed, try not to do any exercise. ? Do not have foods and drinks with caffeine at night. Eating and drinking  Drink enough fluid to keep your pee (urine) pale yellow.  Eat a healthy diet. General instructions   Talk with your doctor to figure out: ? What you need help  with. ? What your safety needs are.  Ask your doctor if it is safe for you to drive.  If told, wear a bracelet that tracks where you are or shows that you are a person with memory loss.  Work with your family to make big decisions.  Keep all follow-up visits as told by your doctor. This is important. Contact a doctor if:  You have any new symptoms.  Your symptoms get worse.  You have problems with swallowing or choking. Get help right away if:  You feel very sad, or feel that you want to harm yourself.  You or your family members are worried for your safety. If you ever feel like you may hurt yourself or others, or have thoughts about taking your own life, get help right away. You can go to your nearest emergency department or call:  Your local emergency services (911 in the [place]  A suicide crisis helpline, such as the [place] at 660-733-9695. This is open 24 hours a day. Summary  Dementia often affects memory and thinking.  Some types of dementia get worse with time and cannot be reversed.  Treatment for this condition depends on the cause.  Talk with your doctor to figure out what you need help with.  Your doctor can help you find support groups and other doctors who can help with your care. This information is not intended to replace advice given to you by your health care provider. Make sure you discuss any questions you have with your health care provider. Document Revised: 11/02/2018 Document Reviewed: 11/02/2018 Elsevier Patient Education  2020 Elsevier Inc.      Agustina Caroli, MD [facility]

## 2020-04-04 NOTE — Patient Instructions (Addendum)
If you have lab work done today you will be contacted with your lab results within the next 2 weeks.  If you have not heard from [place] then please contact us. The fastest way to get your results is to register for My Chart.   IF you received an x-ray today, you will receive an invoice from [facility]. Please contact [facility] at [phone] with questions or concerns regarding your invoice.   IF you received labwork today, you will receive an invoice from [place]. Please contact [facility] at [phone] with questions or concerns regarding your invoice.   Our billing staff will not be able to assist you with questions regarding bills from these companies.  You will be contacted with the lab results as soon as they are available. The fastest way to get your results is to activate your My Chart account. Instructions are located on the last page of this paperwork. If you have not heard from [place] regarding the results in 2 weeks, please contact this office.      Dementia Dementia is a condition that affects the way the brain works. It often affects memory and thinking. There are many types of dementia. Some types get worse with time and cannot be reversed. Some types of dementia include:  Alzheimer's disease. This is the most common type.  Vascular dementia. This type may happen due to a stroke.  Lewy body dementia. This type may happen to people who have Parkinson's disease.  Frontotemporal dementia. This type is caused by damage to nerve cells in certain parts of the brain. Some people may have more than one type, and this is called mixed dementia. What are the causes? This condition is caused by damage to cells in the brain. Some causes that cannot be reversed include:  Having a condition that affects the blood vessels of the brain, such as diabetes, heart disease, or blood vessel disease.  Changes to genes. Some causes that can be reversed or slowed  include:  Injury to the brain.  Certain medicines.  Infection.  Not having enough vitamin B12 in the body, or thyroid problems.  A tumor or blood clot in the brain. What are the signs or symptoms? Symptoms depend on the type of dementia. This may include:  Problems remembering things.  Having trouble taking a bath or putting clothes on.  Forgetting appointments.  Forgetting to pay bills.  Trouble planning and making meals.  Having trouble speaking.  Getting lost easily. How is this treated? Treatment depends on the cause of the dementia. It might include taking medicines that help:  To control the dementia.  To slow down the dementia.  To manage symptoms. In some cases, treating the cause of your dementia can improve symptoms, reverse symptoms, or slow down how quickly it gets worse. Your doctor can help you find support groups and other doctors who can help with your care. Follow these instructions at home: Medicines  Take over-the-counter and prescription medicines only as told by your doctor.  Use a pill organizer to help you manage your medicines.  Avoidtaking medicines for pain or for sleep. Lifestyle  Make healthy choices: ? Be active as told by your doctor. ? Do not use any products that contain nicotine or tobacco, such as cigarettes, e-cigarettes, and chewing tobacco. If you need help quitting, ask your doctor. ? Do not drink alcohol. ? When you get stressed, do something that will help you to relax. Your doctor can give you  tips. ? Spend time with other people.  Make sure you get good sleep. To get good sleep: ? Try not to take naps during the day. ? Keep your bedroom dark and cool. ? In the few hours before you go to bed, try not to do any exercise. ? Do not have foods and drinks with caffeine at night. Eating and drinking  Drink enough fluid to keep your pee (urine) pale yellow.  Eat a healthy diet. General instructions   Talk with your  doctor to figure out: ? What you need help with. ? What your safety needs are.  Ask your doctor if it is safe for you to drive.  If told, wear a bracelet that tracks where you are or shows that you are a person with memory loss.  Work with your family to make big decisions.  Keep all follow-up visits as told by your doctor. This is important. Contact a doctor if:  You have any new symptoms.  Your symptoms get worse.  You have problems with swallowing or choking. Get help right away if:  You feel very sad, or feel that you want to harm yourself.  You or your family members are worried for your safety. If you ever feel like you may hurt yourself or others, or have thoughts about taking your own life, get help right away. You can go to your nearest emergency department or call:  Your local emergency services (911 in the [place]  A suicide crisis helpline, such as the National Suicide Prevention Lifeline at 346-285-9885. This is open 24 hours a day. Summary  Dementia often affects memory and thinking.  Some types of dementia get worse with time and cannot be reversed.  Treatment for this condition depends on the cause.  Talk with your doctor to figure out what you need help with.  Your doctor can help you find support groups and other doctors who can help with your care. This information is not intended to replace advice given to you by your health care provider. Make sure you discuss any questions you have with your health care provider. Document Revised: 11/02/2018 Document Reviewed: 11/02/2018 Elsevier Patient Education  2020 [employer].

## 2020-04-05 LAB — CBC WITH DIFFERENTIAL/PLATELET
Basophils Absolute: 0 10*3/uL (ref 0.0–0.2)
Basos: 1 %
EOS (ABSOLUTE): 0 10*3/uL (ref 0.0–0.4)
Eos: 0 %
Hematocrit: 36 % (ref 34.0–46.6)
Hemoglobin: 10.9 g/dL — ABNORMAL LOW (ref 11.1–15.9)
Immature Grans (Abs): 0 10*3/uL (ref 0.0–0.1)
Immature Granulocytes: 1 %
Lymphocytes Absolute: 1 10*3/uL (ref 0.7–3.1)
Lymphs: 30 %
MCH: 25.6 pg — ABNORMAL LOW (ref 26.6–33.0)
MCHC: 30.3 g/dL — ABNORMAL LOW (ref 31.5–35.7)
MCV: 85 fL (ref 79–97)
Monocytes Absolute: 0.3 10*3/uL (ref 0.1–0.9)
Monocytes: 8 %
Neutrophils Absolute: 2 10*3/uL (ref 1.4–7.0)
Neutrophils: 60 %
Platelets: 195 10*3/uL (ref 150–450)
RBC: 4.25 x10E6/uL (ref 3.77–5.28)
RDW: 14.6 % (ref 11.7–15.4)
WBC: 3.3 10*3/uL — ABNORMAL LOW (ref 3.4–10.8)

## 2020-04-05 LAB — CMP14+EGFR
ALT: 19 IU/L (ref 0–32)
AST: 20 IU/L (ref 0–40)
Albumin/Globulin Ratio: 1.7 (ref 1.2–2.2)
Albumin: 4.3 g/dL (ref 3.7–4.7)
Alkaline Phosphatase: 64 IU/L (ref 48–121)
BUN/Creatinine Ratio: 15 (ref 12–28)
BUN: 15 mg/dL (ref 8–27)
Bilirubin Total: 0.5 mg/dL (ref 0.0–1.2)
CO2: 23 mmol/L (ref 20–29)
Calcium: 9.3 mg/dL (ref 8.7–10.3)
Chloride: 102 mmol/L (ref 96–106)
Creatinine, Ser: 1.02 mg/dL — ABNORMAL HIGH (ref 0.57–1.00)
GFR calc Af Amer: 62 mL/min/{1.73_m2} (ref >59)
GFR calc non Af Amer: 54 mL/min/{1.73_m2} — ABNORMAL LOW (ref >59)
Globulin, Total: 2.6 g/dL (ref 1.5–4.5)
Glucose: 81 mg/dL (ref 65–99)
Potassium: 4.4 mmol/L (ref 3.5–5.2)
Sodium: 138 mmol/L (ref 134–144)
Total Protein: 6.9 g/dL (ref 6.0–8.5)

## 2020-04-05 LAB — LIPID PANEL
Chol/HDL Ratio: 2.2 ratio (ref 0.0–4.4)
Cholesterol, Total: 157 mg/dL (ref 100–199)
HDL: 71 mg/dL (ref >39)
LDL Chol Calc (NIH): 74 mg/dL (ref 0–99)
Triglycerides: 60 mg/dL (ref 0–149)
VLDL Cholesterol Cal: 12 mg/dL (ref 5–40)

## 2020-04-05 LAB — HEMOGLOBIN A1C
Est. average glucose Bld gHb Est-mCnc: 105 mg/dL
Hgb A1c MFr Bld: 5.3 % (ref 4.8–5.6)

## 2020-04-05 LAB — TSH: TSH: 1.16 u[IU]/mL (ref 0.450–4.500)

## 2020-04-05 LAB — VITAMIN B12: Vitamin B-12: 729 pg/mL (ref 232–1245)

## 2020-04-17 ENCOUNTER — Other Ambulatory Visit: Payer: Self-pay

## 2020-04-17 ENCOUNTER — Ambulatory Visit: Payer: Medicare PPO | Admitting: Emergency Medicine

## 2020-04-17 ENCOUNTER — Encounter: Payer: Self-pay | Admitting: Emergency Medicine

## 2020-04-17 VITALS — BP 112/67 | HR 67 | Temp 98.1°F | Resp 16 | Ht 63.0 in | Wt 107.0 lb

## 2020-04-17 DIAGNOSIS — S0990XA Unspecified injury of head, initial encounter: Secondary | ICD-10-CM

## 2020-04-17 DIAGNOSIS — I480 Paroxysmal atrial fibrillation: Secondary | ICD-10-CM | POA: Diagnosis not present

## 2020-04-17 DIAGNOSIS — F0390 Unspecified dementia without behavioral disturbance: Secondary | ICD-10-CM | POA: Diagnosis not present

## 2020-04-17 DIAGNOSIS — I10 Essential (primary) hypertension: Secondary | ICD-10-CM

## 2020-04-17 DIAGNOSIS — Z7901 Long term (current) use of anticoagulants: Secondary | ICD-10-CM

## 2020-04-17 NOTE — Progress Notes (Signed)
Donna Simmons 79 y.o.   Chief Complaint  Patient presents with  . Head Injury    per patient yesterdy she bumped her head on the door while cleaning causing soreness top of head    HISTORY OF PRESENT ILLNESS: This is a 79 y.o. female bumped front of her head on the door yesterday while cleaning, today complaining of soreness to that area. Denies loss of consciousness, headache, nausea or vomiting, visual changes, balance problems, or any other associated significant symptoms. Patient has a history of senile dementia.  Also has a history of hypertension and paroxysmal atrial fibrillation for which she takes Eliquis. Denies any hematoma or bleeding from nose, ears, or mouth. No other complaints or medical concerns today.  HPI   Prior to Admission medications   Medication Sig Start Date End Date Taking? Authorizing Provider  acetaminophen (TYLENOL) 325 MG tablet Take 325-650 mg by mouth daily as needed (pain or headaches).     [provider]  amLODipine (NORVASC) 10 MG tablet Take 10 mg by mouth daily.  09/14/17   [provider]  Blood Pressure Monitoring (BLOOD PRESSURE CUFF) MISC Take your  blood pressure with blood pressure cuff/moniotor- can be automatic or manual - one to three  times a week as needed 04/20/19   Marykay Lex, MD  ELIQUIS 5 MG TABS tablet Take 1 tablet by mouth twice daily 01/26/20   Marykay Lex, MD  ENSURE (ENSURE) Take 237 mLs by mouth daily.    [provider]  flecainide (TAMBOCOR) 50 MG tablet Take 1 tablet (50 mg total) by mouth 2 (two) times daily. 07/08/19   Marykay Lex, MD  fluticasone (FLONASE) 50 MCG/ACT nasal spray USE TWO SPRAY(S) IN EACH NOSTRIL ONCE DAILY Patient taking differently: Place 2 sprays into both nostrils daily as needed for allergies.  01/05/17   [name], MD  furosemide (LASIX) 20 MG tablet Take 1 tablet by mouth once weekly. 02/13/20   Jodelle Gross, NP  lisinopril (ZESTRIL) 20 MG tablet  Take 1 tablet (20 mg total) by mouth daily. 08/04/19   Marykay Lex, MD  memantine [facility]) 5 MG tablet Take 1 tablet daily for one week, then take 1 tablet twice daily for one week, then take 1 tablet in the morning and 2 in the evening for one week, then take 2 tablets twice daily 07/27/19   York Spaniel, MD  metoprolol succinate (TOPROL-XL) 50 MG 24 hr tablet TAKE 1 TABLET BY MOUTH ONCE DAILY TAKE  WITH  OR  IMMEDIATELY  FOLLOWING  A  MEAL Patient taking differently: Take 50 mg by mouth daily.  09/19/19   Marykay Lex, MD  metoprolol tartrate (LOPRESSOR) 50 MG tablet Take 1 tablet only with flecainide as needed for rapid fast heart rate 08/08/19   Marykay Lex, MD  Multiple Vitamins-Minerals (CENTRUM SILVER 50+WOMEN) TABS Take 1 tablet by mouth daily with lunch.    [provider]  omeprazole (PRILOSEC) 40 MG capsule  01/23/20   [provider]  rosuvastatin (CRESTOR) 20 MG tablet Take 1 tablet (20 mg total) by mouth daily. 08/04/19 04/04/20  Marykay Lex, MD  sucralfate (CARAFATE) 1 GM/10ML suspension Take 10 mLs (1 g total) by mouth 4 (four) times daily -  with meals and at bedtime. 01/05/18   [name], MD    Allergies  Allergen Reactions  . Antihistamines, Chlorpheniramine-Type Palpitations    Makes heart beat fast  Patient Active Problem List   Diagnosis Date Noted  . Other forms of angina pectoris (HCC) 07/25/2018  . Long term current use of anticoagulant therapy 11/18/2012  . Hypercholesteremia 09/25/2012  . DDD (degenerative disc disease), cervical 05/19/2012  . Paroxysmal atrial fibrillation (HCC) - CHA2DS2-VASc Score 3, on Eliquis 04/20/2012  . Essential hypertension 04/20/2012    Past Medical History:  Diagnosis Date  . Allergy   . GERD (gastroesophageal reflux disease)   . Heart murmur    No significant valvular lesion noted on echo.  . Hypercholesteremia   . Hypertension   . Paroxysmal atrial fibrillation (HCC) 12/27/2008     Past Surgical History:  Procedure Laterality Date  . ABDOMINAL HYSTERECTOMY  1980  . BREAST SURGERY    . CT CTA CORONARY W/CA SCORE W/CM &/OR WO/CM  08/2018   Coronary calcium score 24.  Nonobstructive CAD with less than 30% plaque in proximal LAD.  Normal aortic root. ->  Relook in October 2020 had significant motion artifact but coronary calcium score 26.  Marland Kitchen. EYE SURGERY Right 12/29/2016  . EYE SURGERY Left 01/19/2017  . FRACTURE SURGERY    . LEFT HEART CATH AND CORONARY ANGIOGRAPHY  12/16/2010   no evidence of CAD to explain anginal pain w/ positive troponin.  potential etiology is breakthrough AF  . NM MYOVIEW LTD  April 2010   EF 64%, normal pattern of perfusion in all regions, no scintigraphic evidence of inducible ischemia; no significant wall motion abnormalities; EKG negative for ischemia; no significant change from last study; low risk scan  . TRANSTHORACIC ECHOCARDIOGRAM  07/2018   Normal EF 55-60%.  No RWMA. GR 1 DD. Ao Sclerosis - no Stenosis. Mild AI. Trivial MR.     Social History   Socioeconomic History  . Marital status: Married    Spouse name: Not on file  . Number of children: Not on file  . Years of education: Not on file  . Highest education level: Not on file  Occupational History  . Not on file  Tobacco Use  . Smoking status: Never Smoker  . Smokeless tobacco: Never Used  Substance and Sexual Activity  . Alcohol use: No    Alcohol/week: 0.0 standard drinks  . Drug use: No  . Sexual activity: Yes  Other Topics Concern  . Not on file  Social History Narrative   Married with no children. Walks several times a week at least 3-4 times a week about 2 miles a time. Does not smoke and does not drink.   Social Determinants of Health   Financial Resource Strain:   . Difficulty of Paying Living Expenses:   Food Insecurity:   . Worried About Programme researcher, broadcasting/film/videounning Out of Food in the Last Year:   . Baristaan Out of Food in the Last Year:   Transportation Needs:   . Automotive engineerLack of  Transportation (Medical):   Marland Kitchen. Lack of Transportation (Non-Medical):   Physical Activity:   . Days of Exercise per Week:   . Minutes of Exercise per Session:   Stress:   . Feeling of Stress :   Social Connections:   . Frequency of Communication with Friends and Family:   . Frequency of Social Gatherings with Friends and Family:   . Attends Religious Services:   . Active Member of Clubs or Organizations:   . Attends BankerClub or Organization Meetings:   Marland Kitchen. Marital Status:   Intimate Partner Violence:   . Fear of Current or Ex-Partner:   . Emotionally Abused:   .  Physically Abused:   . Sexually Abused:     Family History  Problem Relation Age of Onset  . Arthritis Mother        OSTEO  . Hypertension Mother   . Dementia Mother   . Heart disease Father   . Hypertension Sister   . Dementia Sister   . Cancer - Lung Brother   . Cancer Maternal Grandmother   . Heart disease Maternal Grandfather   . Cancer - Lung Brother   . Hypertension Brother   . Cancer - Prostate Brother      Review of Systems  Constitutional: Negative.  Negative for chills and fever.  HENT: Negative for nosebleeds.   Eyes: Negative for blurred vision, double vision, pain and redness.  Respiratory: Negative.  Negative for cough and shortness of breath.   Cardiovascular: Negative.  Negative for chest pain.  Gastrointestinal: Negative for abdominal pain, nausea and vomiting.  Genitourinary: Negative for hematuria.  Skin: Negative.  Negative for rash.  Neurological: Negative for dizziness, sensory change, speech change, focal weakness, seizures, loss of consciousness and headaches.  All other systems reviewed and are negative.   Today's Vitals   04/17/20 1107  BP: 112/67  Pulse: 67  Resp: 16  Temp: 98.1 F (36.7 C)  TempSrc: Temporal  SpO2: 99%  Weight: 107 lb (48.5 kg)  Height: 5\' 3"  (1.6 m)   Body mass index is 18.95 kg/m.  Physical Exam Vitals reviewed.  Constitutional:      Appearance:  Normal appearance.  HENT:     Head: Normocephalic.     Right Ear: Tympanic membrane, ear canal and external ear normal.     Left Ear: Tympanic membrane, ear canal and external ear normal.     Ears:     Comments: No hemotympanum    Nose: Nose normal.     Comments: No epistaxis    Mouth/Throat:     Mouth: Mucous membranes are moist.     Pharynx: Oropharynx is clear.  Eyes:     Extraocular Movements: Extraocular movements intact.     Conjunctiva/sclera: Conjunctivae normal.     Pupils: Pupils are equal, round, and reactive to light.  Cardiovascular:     Rate and Rhythm: Normal rate.  Pulmonary:     Effort: Pulmonary effort is normal.  Musculoskeletal:        General: Normal range of motion.     Cervical back: No tenderness.  Skin:    General: Skin is warm and dry.     Capillary Refill: Capillary refill takes less than 2 seconds.  Neurological:     General: No focal deficit present.     Mental Status: She is alert and oriented to person, place, and time. Mental status is at baseline.     Sensory: No sensory deficit.     Motor: No weakness.     Gait: Gait normal.  Psychiatric:        Mood and Affect: Mood normal.        Behavior: Behavior normal.    A total of 30 minutes was spent with the patient, greater than 50% of which was in counseling/coordination of care regarding head injuries and red flag signs and symptoms, ED precautions, review of most recent office visit notes, review of all medications, prognosis and need to contact the office immediately if her condition changes or worsens.   ASSESSMENT & PLAN: Clinically stable.  No red flag signs or symptoms of a serious head injury.  ED precautions  given.  Head injury instructions given. Azjah was seen today for head injury.  Diagnoses and all orders for this visit:  Acute head injury, initial encounter  Senile dementia without behavioral disturbance (HCC)  Long term current use of anticoagulant therapy  Paroxysmal  atrial fibrillation [facility])  Essential hypertension    Patient Instructions       If you have lab work done today you will be contacted with your lab results within the next 2 weeks.  If you have not heard from [place] then please contact us. The fastest way to get your results is to register for My Chart.   IF you received an x-ray today, you will receive an invoice from [facility]. Please contact [facility] at [phone] with questions or concerns regarding your invoice.   IF you received labwork today, you will receive an invoice from [place]. Please contact [facility] at [phone] with questions or concerns regarding your invoice.   Our billing staff will not be able to assist you with questions regarding bills from these companies.  You will be contacted with the lab results as soon as they are available. The fastest way to get your results is to activate your My Chart account. Instructions are located on the last page of this paperwork. If you have not heard from [place] regarding the results in 2 weeks, please contact this office.     Head Injury, Adult There are many types of head injuries. They can be as minor as a bump. Some head injuries can be worse. Worse injuries include:  A strong hit to the head that shakes the brain back and forth causing damage (concussion).  A bruise (contusion) of the brain. This means there is bleeding in the brain that can cause swelling.  A cracked skull (skull fracture).  Bleeding in the brain that gathers, gets thick (makes a clot), and forms a bump (hematoma). Most problems from a head injury come in the first 24 hours. However, you may still have side effects up to 7-10 days after your injury. It is important to watch your condition for any changes. You may need to be watched in the emergency department or urgent care, or you may need to stay in the hospital. What are the causes? There are many possible causes of a head  injury. A serious head injury may be caused by:  A car accident.  Bicycle or motorcycle accidents.  Sports injuries.  Falls. What are the signs or symptoms? Symptoms of a head injury include a bruise, bump, or bleeding where the injury happened. Other physical symptoms may include:  Headache.  Feeling sick to your stomach (nauseous) or vomiting.  Dizziness.  Feeling tired.  Being uncomfortable around bright lights or loud noises.  Shaking movements that you cannot control (seizures).  Trouble being woken up.  Passing out (fainting). Mental or emotional symptoms may include:  Feeling grumpy or cranky.  Confusion and memory problems.  Having trouble paying attention or concentrating.  Changes in eating or sleeping habits.  Feeling worried or nervous (anxious).  Feeling sad (depressed). How is this treated? Treatment for this condition depends on how severe the injury is and the type of injury you have. The main goal is to prevent complications and to allow the brain time to heal. Mild head injury If you have a mild head injury, you may be sent home and treatment may include:  Being watched. A responsible adult should stay with you for 24 hours after your  injury and check on you often.  Physical rest.  Brain rest.  Pain medicines. Severe head injury If you have a severe head injury, treatment may include:  Being watched closely. This includes hospitalization with frequent physical exams.  Medicines to: ? Help with pain. ? Prevent shaking movements that you cannot control. ? Help with brain swelling.  Using a machine that helps you breathe (ventilator).  Treatments to manage the swelling inside the brain.  Brain surgery. This may be needed to: ? Remove a blood clot. ? Stop the bleeding. ? Remove a part of the skull. This allows room for the brain to swell. Follow these instructions at home: Activity  Rest.  Avoid activities that are hard or  tiring.  Make sure you get enough sleep.  Limit activities that need a lot of thought or attention, such as: ? Watching TV. ? Playing memory games and puzzles. ? Job-related work or homework. ? Working on [employer], and texting.  Avoid activities that could cause another head injury until your doctor says it is okay. This includes playing sports. Having another head injury, especially before the first one has healed, can be dangerous.  Ask your doctor when it is safe for you to go back to your normal activities, such as work or school. Ask your doctor for a step-by-step plan for slowly going back to your normal activities.  Ask your doctor when you can drive, ride a bicycle, or use heavy machinery. Do not do these activities if you are dizzy. Lifestyle   Do not drink alcohol until your doctor says it is okay.  Do not use drugs.  If it is harder than usual to remember things, write them down.  If you are easily distracted, try to do one thing at a time.  Talk with family members or close friends when making important decisions.  Tell your friends, family, a trusted coworker, and work Production designer, theatre/television/film about your injury, symptoms, and limits (restrictions). Have them watch for any problems that are new or getting worse. General instructions  Take over-the-counter and prescription medicines only as told by your doctor.  Have someone stay with you for 24 hours after your head injury. This person should watch you for any changes in your symptoms and be ready to get help.  Keep all follow-up visits as told by your doctor. This is important. How is this prevented?  Work on Therapist, music. This can help you avoid falls.  Wear a seatbelt when you are in a moving vehicle.  Wear a helmet when you: ? Ride a bicycle. ? Ski. ? Do any other sport or activity that has a risk of injury.  If you drink alcohol: ? Limit how much you use to:  0-1 drink a day for  women.  0-2 drinks a day for men. ? Be aware of how much alcohol is in your drink. In the [place] one drink equals one 12 oz bottle of beer (355 mL), one 5 oz glass of wine (148 mL), or one 1 oz glass of hard liquor (44 mL).  Make your home safer by: ? Getting rid of clutter from the floors and stairs. This includes things that can make you trip. ? Using grab bars in bathrooms and handrails by stairs. ? Placing non-slip mats on floors and in bathtubs. ? Putting more light in dim areas. Get help right away if:  You have: ? A very bad headache that is not helped by  medicine. ? Trouble walking or weakness in your arms and legs. ? Clear or bloody fluid coming from your nose or ears. ? Changes in how you see (vision). ? Shaking movements that you cannot control.  You lose your balance.  You vomit.  The black centers of your eyes (pupils) change in size.  Your speech is slurred.  Your dizziness gets worse.  You pass out.  You are sleepier than normal and have trouble staying awake.  Your symptoms get worse. These symptoms may be an emergency. Do not wait to see if the symptoms will go away. Get medical help right away. Call your local emergency services (911 in the [place] Do not drive yourself to the hospital. Summary  There are many types of head injuries. They can be as minor as a bump. Some head injuries can be worse  Treatment for this condition depends on how severe the injury is and the type of injury you have.  Ask your doctor when it is safe for you to go back to your normal activities, such as work or school.  To prevent a head injury, wear a seat belt in a car, wear a helmet when you use a a bicycle, limit your alcohol use, and make your home safer. This information is not intended to replace advice given to you by your health care provider. Make sure you discuss any questions you have with your health care provider. Document Revised: 12/09/2018 Document Reviewed:  09/10/2018 Elsevier Patient Education  2020 [employer]      Edwina Barth, MD [facility]

## 2020-04-17 NOTE — Patient Instructions (Addendum)
   If you have lab work done today you will be contacted with your lab results within the next 2 weeks.  If you have not heard from us then please contact us. The fastest way to get your results is to register for My Chart.   IF you received an x-ray today, you will receive an invoice from [facility]. Please contact [facility] at [phone] with questions or concerns regarding your invoice.   IF you received labwork today, you will receive an invoice from [facility]. Please contact [facility] at [phone] with questions or concerns regarding your invoice.   Our billing staff will not be able to assist you with questions regarding bills from these companies.  You will be contacted with the lab results as soon as they are available. The fastest way to get your results is to activate your My Chart account. Instructions are located on the last page of this paperwork. If you have not heard from us regarding the results in 2 weeks, please contact this office.      Head Injury, Adult There are many types of head injuries. They can be as minor as a bump. Some head injuries can be worse. Worse injuries include:  A strong hit to the head that shakes the brain back and forth causing damage (concussion).  A bruise (contusion) of the brain. This means there is bleeding in the brain that can cause swelling.  A cracked skull (skull fracture).  Bleeding in the brain that gathers, gets thick (makes a clot), and forms a bump (hematoma). Most problems from a head injury come in the first 24 hours. However, you may still have side effects up to 7-10 days after your injury. It is important to watch your condition for any changes. You may need to be watched in the emergency department or urgent care, or you may need to stay in the hospital. What are the causes? There are many possible causes of a head injury. A serious head injury may be caused by:  A car accident.  Bicycle or  motorcycle accidents.  Sports injuries.  Falls. What are the signs or symptoms? Symptoms of a head injury include a bruise, bump, or bleeding where the injury happened. Other physical symptoms may include:  Headache.  Feeling sick to your stomach (nauseous) or vomiting.  Dizziness.  Feeling tired.  Being uncomfortable around bright lights or loud noises.  Shaking movements that you cannot control (seizures).  Trouble being woken up.  Passing out (fainting). Mental or emotional symptoms may include:  Feeling grumpy or cranky.  Confusion and memory problems.  Having trouble paying attention or concentrating.  Changes in eating or sleeping habits.  Feeling worried or nervous (anxious).  Feeling sad (depressed). How is this treated? Treatment for this condition depends on how severe the injury is and the type of injury you have. The main goal is to prevent complications and to allow the brain time to heal. Mild head injury If you have a mild head injury, you may be sent home and treatment may include:  Being watched. A responsible adult should stay with you for 24 hours after your injury and check on you often.  Physical rest.  Brain rest.  Pain medicines. Severe head injury If you have a severe head injury, treatment may include:  Being watched closely. This includes hospitalization with frequent physical exams.  Medicines to: ? Help with pain. ? Prevent shaking movements that you cannot control. ? Help with brain   swelling.  Using a machine that helps you breathe (ventilator).  Treatments to manage the swelling inside the brain.  Brain surgery. This may be needed to: ? Remove a blood clot. ? Stop the bleeding. ? Remove a part of the skull. This allows room for the brain to swell. Follow these instructions at home: Activity  Rest.  Avoid activities that are hard or tiring.  Make sure you get enough sleep.  Limit activities that need a lot of  thought or attention, such as: ? Watching TV. ? Playing memory games and puzzles. ? Job-related work or homework. ? Working on the computer, social media, and texting.  Avoid activities that could cause another head injury until your doctor says it is okay. This includes playing sports. Having another head injury, especially before the first one has healed, can be dangerous.  Ask your doctor when it is safe for you to go back to your normal activities, such as work or school. Ask your doctor for a step-by-step plan for slowly going back to your normal activities.  Ask your doctor when you can drive, ride a bicycle, or use heavy machinery. Do not do these activities if you are dizzy. Lifestyle   Do not drink alcohol until your doctor says it is okay.  Do not use drugs.  If it is harder than usual to remember things, write them down.  If you are easily distracted, try to do one thing at a time.  Talk with family members or close friends when making important decisions.  Tell your friends, family, a trusted coworker, and work manager about your injury, symptoms, and limits (restrictions). Have them watch for any problems that are new or getting worse. General instructions  Take over-the-counter and prescription medicines only as told by your doctor.  Have someone stay with you for 24 hours after your head injury. This person should watch you for any changes in your symptoms and be ready to get help.  Keep all follow-up visits as told by your doctor. This is important. How is this prevented?  Work on your balance and strength. This can help you avoid falls.  Wear a seatbelt when you are in a moving vehicle.  Wear a helmet when you: ? Ride a bicycle. ? Ski. ? Do any other sport or activity that has a risk of injury.  If you drink alcohol: ? Limit how much you use to:  0-1 drink a day for women.  0-2 drinks a day for men. ? Be aware of how much alcohol is in your drink. In  the [place] one drink equals one 12 oz bottle of beer (355 mL), one 5 oz glass of wine (148 mL), or one 1 oz glass of hard liquor (44 mL).  Make your home safer by: ? Getting rid of clutter from the floors and stairs. This includes things that can make you trip. ? Using grab bars in bathrooms and handrails by stairs. ? Placing non-slip mats on floors and in bathtubs. ? Putting more light in dim areas. Get help right away if:  You have: ? A very bad headache that is not helped by medicine. ? Trouble walking or weakness in your arms and legs. ? Clear or bloody fluid coming from your nose or ears. ? Changes in how you see (vision). ? Shaking movements that you cannot control.  You lose your balance.  You vomit.  The black centers of your eyes (pupils) change in size.  Your speech   is slurred.  Your dizziness gets worse.  You pass out.  You are sleepier than normal and have trouble staying awake.  Your symptoms get worse. These symptoms may be an emergency. Do not wait to see if the symptoms will go away. Get medical help right away. Call your local emergency services (911 in the [place] Do not drive yourself to the hospital. Summary  There are many types of head injuries. They can be as minor as a bump. Some head injuries can be worse  Treatment for this condition depends on how severe the injury is and the type of injury you have.  Ask your doctor when it is safe for you to go back to your normal activities, such as work or school.  To prevent a head injury, wear a seat belt in a car, wear a helmet when you use a a bicycle, limit your alcohol use, and make your home safer. This information is not intended to replace advice given to you by your health care provider. Make sure you discuss any questions you have with your health care provider. Document Revised: 12/09/2018 Document Reviewed: 09/10/2018 Elsevier Patient Education  2020 [employer]vier Inc.  

## 2020-04-30 ENCOUNTER — Other Ambulatory Visit: Payer: Self-pay | Admitting: Cardiology

## 2020-05-05 ENCOUNTER — Emergency Department (HOSPITAL_COMMUNITY)
Admission: EM | Admit: 2020-05-05 | Discharge: 2020-05-06 | Disposition: A | Discharge status: Home / Self Care | Payer: Medicare PPO | Attending: Emergency Medicine | Admitting: Emergency Medicine

## 2020-05-05 DIAGNOSIS — F29 Unspecified psychosis not due to a substance or known physiological condition: Secondary | ICD-10-CM | POA: Diagnosis not present

## 2020-05-05 DIAGNOSIS — Z8659 Personal history of other mental and behavioral disorders: Secondary | ICD-10-CM | POA: Diagnosis not present

## 2020-05-05 DIAGNOSIS — Z79899 Other long term (current) drug therapy: Secondary | ICD-10-CM | POA: Diagnosis not present

## 2020-05-05 DIAGNOSIS — R442 Other hallucinations: Secondary | ICD-10-CM | POA: Diagnosis not present

## 2020-05-05 DIAGNOSIS — R443 Hallucinations, unspecified: Secondary | ICD-10-CM | POA: Insufficient documentation

## 2020-05-05 DIAGNOSIS — Z7901 Long term (current) use of anticoagulants: Secondary | ICD-10-CM | POA: Diagnosis not present

## 2020-05-05 DIAGNOSIS — R404 Transient alteration of awareness: Secondary | ICD-10-CM | POA: Diagnosis not present

## 2020-05-05 DIAGNOSIS — F0390 Unspecified dementia without behavioral disturbance: Secondary | ICD-10-CM | POA: Diagnosis not present

## 2020-05-05 DIAGNOSIS — Z743 Need for continuous supervision: Secondary | ICD-10-CM | POA: Diagnosis not present

## 2020-05-05 DIAGNOSIS — R52 Pain, unspecified: Secondary | ICD-10-CM | POA: Diagnosis not present

## 2020-05-05 DIAGNOSIS — I10 Essential (primary) hypertension: Secondary | ICD-10-CM | POA: Insufficient documentation

## 2020-05-05 HISTORY — DX: Other amnesia: R41.3

## 2020-05-06 ENCOUNTER — Other Ambulatory Visit: Payer: Self-pay

## 2020-05-06 ENCOUNTER — Encounter (HOSPITAL_COMMUNITY): Payer: Self-pay

## 2020-05-06 ENCOUNTER — Telehealth: Payer: Self-pay

## 2020-05-06 DIAGNOSIS — Z743 Need for continuous supervision: Secondary | ICD-10-CM | POA: Diagnosis not present

## 2020-05-06 DIAGNOSIS — M2550 Pain in unspecified joint: Secondary | ICD-10-CM | POA: Diagnosis not present

## 2020-05-06 DIAGNOSIS — Z7401 Bed confinement status: Secondary | ICD-10-CM | POA: Diagnosis not present

## 2020-05-06 DIAGNOSIS — R404 Transient alteration of awareness: Secondary | ICD-10-CM | POA: Diagnosis not present

## 2020-05-06 DIAGNOSIS — F0390 Unspecified dementia without behavioral disturbance: Secondary | ICD-10-CM | POA: Diagnosis not present

## 2020-05-06 NOTE — ED Notes (Signed)
Pt left with belongings (walking cane) w PTAR, VS WNL, pt in NAD

## 2020-05-06 NOTE — ED Provider Notes (Signed)
[facility] EMERGENCY DEPARTMENT Provider Note  CSN: [id number] Arrival date & time:    Chief Complaint(s) Dementia (pt w dementia/memory loss left home at 1800, husband could not locate pt and called 911. Pt has no medical complaints- EMS states pt confusion increased- pt A&O X3)  HPI Donna MCCLATCHY is a 79 y.o. female with a history of dementia who left her home this afternoon and it was found 3 blocks away.  Husband reports that she was found by bystanders before he got to her who had already called EMS.  He decided to allow her to be transported by EMS stating that he was hoping he can get some help for managing her symptoms.  He reports that the patient has otherwise been at her normal state of health.  He reports that she intermittently has hallucinations, stating that she feels there is other people in her home.  This is not unusual for her.  Reports that she had the same this afternoon prior to leaving the home.  HPI  Past Medical History Past Medical History:  Diagnosis Date  . Allergy   . GERD (gastroesophageal reflux disease)   . Heart murmur    No significant valvular lesion noted on echo.  . Hypercholesteremia   . Hypertension   . Memory loss   . Paroxysmal atrial fibrillation (HCC) 12/27/2008   Patient Active Problem List   Diagnosis Date Noted  . Other forms of angina pectoris (HCC) 07/25/2018  . Long term current use of anticoagulant therapy 11/18/2012  . Hypercholesteremia 09/25/2012  . DDD (degenerative disc disease), cervical 05/19/2012  . Paroxysmal atrial fibrillation (HCC) - CHA2DS2-VASc Score 3, on Eliquis 04/20/2012  . Essential hypertension 04/20/2012   Home Medication(s) Prior to Admission medications   Medication Sig Start Date End Date Taking? Authorizing Provider  acetaminophen (TYLENOL) 325 MG tablet Take 325-650 mg by mouth daily as needed (pain or headaches).     [provider]  amLODipine (NORVASC) 10 MG tablet Take  10 mg by mouth daily.  09/14/17   [provider]  Blood Pressure Monitoring (BLOOD PRESSURE CUFF) MISC Take your  blood pressure with blood pressure cuff/moniotor- can be automatic or manual - one to three  times a week as needed 04/20/19   Marykay Lex, MD  ELIQUIS 5 MG TABS tablet Take 1 tablet by mouth twice daily 04/30/20   Marykay Lex, MD  ENSURE (ENSURE) Take 237 mLs by mouth daily.    [provider]  flecainide (TAMBOCOR) 50 MG tablet Take 1 tablet (50 mg total) by mouth 2 (two) times daily. 07/08/19   Marykay Lex, MD  fluticasone (FLONASE) 50 MCG/ACT nasal spray USE TWO SPRAY(S) IN EACH NOSTRIL ONCE DAILY Patient taking differently: Place 2 sprays into both nostrils daily as needed for allergies.  01/05/17   [name], MD  furosemide (LASIX) 20 MG tablet Take 1 tablet by mouth once weekly. 02/13/20   Jodelle Gross, NP  lisinopril (ZESTRIL) 20 MG tablet Take 1 tablet (20 mg total) by mouth daily. 08/04/19   Marykay Lex, MD  memantine [facility]) 5 MG tablet Take 1 tablet daily for one week, then take 1 tablet twice daily for one week, then take 1 tablet in the morning and 2 in the evening for one week, then take 2 tablets twice daily 07/27/19   York Spaniel, MD  metoprolol succinate (TOPROL-XL) 50 MG 24 hr tablet TAKE 1 TABLET BY MOUTH ONCE  DAILY TAKE  WITH  OR  IMMEDIATELY  FOLLOWING  A  MEAL Patient taking differently: Take 50 mg by mouth daily.  09/19/19   Marykay Lex, MD  metoprolol tartrate (LOPRESSOR) 50 MG tablet Take 1 tablet only with flecainide as needed for rapid fast heart rate 08/08/19   Marykay Lex, MD  Multiple Vitamins-Minerals (CENTRUM SILVER 50+WOMEN) TABS Take 1 tablet by mouth daily with lunch.    [provider]  omeprazole (PRILOSEC) 40 MG capsule  01/23/20   [provider]  rosuvastatin (CRESTOR) 20 MG tablet Take 1 tablet (20 mg total) by mouth daily. 08/04/19 04/04/20  Marykay Lex, MD    sucralfate (CARAFATE) 1 GM/10ML suspension Take 10 mLs (1 g total) by mouth 4 (four) times daily -  with meals and at bedtime. 01/05/18   [name], MD                                                                                                                                    Past Surgical History Past Surgical History:  Procedure Laterality Date  . ABDOMINAL HYSTERECTOMY  1980  . BREAST SURGERY    . CT CTA CORONARY W/CA SCORE W/CM &/OR WO/CM  08/2018   Coronary calcium score 24.  Nonobstructive CAD with less than 30% plaque in proximal LAD.  Normal aortic root. ->  Relook in October 2020 had significant motion artifact but coronary calcium score 26.  Marland Kitchen EYE SURGERY Right 12/29/2016  . EYE SURGERY Left 01/19/2017  . FRACTURE SURGERY    . LEFT HEART CATH AND CORONARY ANGIOGRAPHY  12/16/2010   no evidence of CAD to explain anginal pain w/ positive troponin.  potential etiology is breakthrough AF  . NM MYOVIEW LTD  April 2010   EF 64%, normal pattern of perfusion in all regions, no scintigraphic evidence of inducible ischemia; no significant wall motion abnormalities; EKG negative for ischemia; no significant change from last study; low risk scan  . TRANSTHORACIC ECHOCARDIOGRAM  07/2018   Normal EF 55-60%.  No RWMA. GR 1 DD. Ao Sclerosis - no Stenosis. Mild AI. Trivial MR.    Family History Family History  Problem Relation Age of Onset  . Arthritis Mother        OSTEO  . Hypertension Mother   . Dementia Mother   . Heart disease Father   . Hypertension Sister   . Dementia Sister   . Cancer - Lung Brother   . Cancer Maternal Grandmother   . Heart disease Maternal Grandfather   . Cancer - Lung Brother   . Hypertension Brother   . Cancer - Prostate Brother     Social History Social History   Tobacco Use  . Smoking status: Never Smoker  . Smokeless tobacco: Never Used  Vaping Use  . Vaping Use: Never used  Substance Use Topics  . Alcohol use: No    Alcohol/week:  0.0  standard drinks  . Drug use: No   Allergies Antihistamines, chlorpheniramine-type  Review of Systems Review of Systems  Unable to perform ROS: Dementia    Physical Exam Vital Signs  I have reviewed the triage vital signs BP 138/79 (BP Location: Left Arm)   Pulse (!) 58   Temp 97.8 F (36.6 C) (Oral)   Resp 16   SpO2 100%   Physical Exam Vitals reviewed.  Constitutional:      General: She is not in acute distress.    Appearance: She is well-developed. She is not diaphoretic.  HENT:     Head: Normocephalic and atraumatic.     Nose: Nose normal.  Eyes:     General: No scleral icterus.       Right eye: No discharge.        Left eye: No discharge.     Conjunctiva/sclera: Conjunctivae normal.     Pupils: Pupils are equal, round, and reactive to light.  Cardiovascular:     Rate and Rhythm: Normal rate and regular rhythm.     Heart sounds: No murmur heard.  No friction rub. No gallop.   Pulmonary:     Effort: Pulmonary effort is normal. No respiratory distress.     Breath sounds: Normal breath sounds. No stridor. No rales.  Abdominal:     General: There is no distension.     Palpations: Abdomen is soft.     Tenderness: There is no abdominal tenderness.  Musculoskeletal:        General: No tenderness.     Cervical back: Normal range of motion and neck supple.  Skin:    General: Skin is warm and dry.     Findings: No erythema or rash.  Neurological:     Mental Status: She is alert.     Comments: Pleasantly demented Moves all extremities     ED Results and Treatments Labs (all labs ordered are listed, but only abnormal results are displayed) Labs Reviewed - No data to display                                                                                                                       EKG  EKG Interpretation  Date/Time:    Ventricular Rate:    PR Interval:    QRS Duration:   QT Interval:    QTC Calculation:   R Axis:     Text Interpretation:         Radiology No results found.  Pertinent labs & imaging results that were available during my care of the patient were reviewed by me and considered in my medical decision making (see chart for details).  Medications Ordered in ED Medications - No data to display  Procedures Procedures  (including critical care time)  Medical Decision Making / ED Course I have reviewed the nursing notes for this encounter and the patient's prior records (if available in EHR or on provided paperwork).   Kathaleen MaserMary P Blatchley was evaluated in Emergency Department on 05/06/2020 for the symptoms described in the history of present illness. She was evaluated in the context of the global COVID-19 pandemic, which necessitated consideration that the patient might be at risk for infection with the SARS-CoV-2 virus that causes COVID-19. Institutional protocols and algorithms that pertain to the evaluation of patients at risk for COVID-19 are in a state of rapid change based on information released by regulatory bodies including the CDC and federal and state organizations. These policies and algorithms were followed during the patient's care in the ED.  History of dementia who wandered off away from home. The known normal state of health per the husband. No obvious signs of trauma. Patient is afebrile with stable vital signs, well-appearing and well-hydrated.  Has no physical complaints.  Spoke with husband and offered to have transitional care team help arrange for home health.  Face-to-face orders placed.      Final Clinical Impression(s) / ED Diagnoses Final diagnoses:  History of dementia   The patient appears reasonably screened and/or stabilized for discharge and I doubt any other medical condition or other [facility] requiring further screening, evaluation, or treatment in the ED at this time  prior to discharge. Safe for discharge with strict return precautions.  Disposition: Discharge  Condition: Good  I have discussed the results, Dx and Tx plan with the patient/family who expressed understanding and agree(s) with the plan. Discharge instructions discussed at length. The patient/family was given strict return precautions who verbalized understanding of the instructions. No further questions at time of discharge.    ED Discharge Orders    None        Follow Up: Georgina QuintSagardia, Miguel Jose, MD [address] [place] [id number] [phone]  Schedule an appointment as soon as possible for a visit        This chart was dictated using voice recognition software.  Despite best efforts to proofread,  errors can occur which can change the documentation meaning.   Nira Connardama, Lavaun Greenfield Eduardo, MD 05/06/20 [phone]

## 2020-05-06 NOTE — ED Notes (Signed)
Pt up for discharge, provider spoke w husband, will arrange transport home for pt w PTAR

## 2020-05-06 NOTE — ED Notes (Signed)
Assisted pt to restroom, pt in NAD

## 2020-05-06 NOTE — ED Notes (Signed)
[facility] called 

## 2020-05-06 NOTE — Telephone Encounter (Signed)
Called home number Donna Simmons answered. I told him that I secured [facility] for home health Aide, RN and Social work and they would be calling him to schedule. He stated that [name] is doing "just fine" and he appreciates the call

## 2020-05-06 NOTE — TOC Transition Note (Signed)
Transition of Care [facility]) - CM/SW Discharge Note   Patient Details  Name: Donna Simmons MRN: [id number] Date of Birth: [birth year]  [facility] CM/SW Contact:  Lockie Pares, RN Phone Number: 05/06/2020, 11:30 AM   Clinical Narrative:     [facility] will take patient for RN, aide, social work. Called number listed, mailbox has not been set up, could not leave message will try back later to inform  patient/spouse of home health set up.   Final next level of care: Home w Home Health Services Barriers to Discharge: No Barriers Identified   Patient Goals and CMS Choice        Discharge Placement                       Discharge Plan and Services                          HH Arranged: RN, Social Work, Nurse's Aide HH Agency: [facility] Date [facility] Agency Contacted: 05/06/20 Time HH Agency Contacted: 1100 Representative spoke with at [facility] Agency: Jenell Milliner  Social Determinants of Health (SDOH) Interventions     Readmission Risk Interventions No flowsheet data found.

## 2020-05-18 ENCOUNTER — Emergency Department (HOSPITAL_COMMUNITY): Payer: Medicare PPO

## 2020-05-18 ENCOUNTER — Emergency Department (HOSPITAL_COMMUNITY)
Admission: EM | Admit: 2020-05-18 | Discharge: 2020-05-18 | Disposition: A | Discharge status: Home / Self Care | Payer: Medicare PPO | Attending: Emergency Medicine | Admitting: Emergency Medicine

## 2020-05-18 ENCOUNTER — Encounter (HOSPITAL_COMMUNITY): Payer: Self-pay

## 2020-05-18 ENCOUNTER — Ambulatory Visit (HOSPITAL_COMMUNITY)
Admission: EM | Admit: 2020-05-18 | Discharge: 2020-05-18 | Disposition: A | Discharge status: Other Acute Inpatient Hospital | Payer: Medicare PPO

## 2020-05-18 ENCOUNTER — Other Ambulatory Visit: Payer: Self-pay

## 2020-05-18 DIAGNOSIS — S0083XA Contusion of other part of head, initial encounter: Secondary | ICD-10-CM

## 2020-05-18 DIAGNOSIS — Y9389 Activity, other specified: Secondary | ICD-10-CM | POA: Diagnosis not present

## 2020-05-18 DIAGNOSIS — Z79899 Other long term (current) drug therapy: Secondary | ICD-10-CM | POA: Insufficient documentation

## 2020-05-18 DIAGNOSIS — I6782 Cerebral ischemia: Secondary | ICD-10-CM | POA: Diagnosis not present

## 2020-05-18 DIAGNOSIS — S0990XA Unspecified injury of head, initial encounter: Secondary | ICD-10-CM | POA: Diagnosis not present

## 2020-05-18 DIAGNOSIS — W228XXA Striking against or struck by other objects, initial encounter: Secondary | ICD-10-CM | POA: Diagnosis not present

## 2020-05-18 DIAGNOSIS — I10 Essential (primary) hypertension: Secondary | ICD-10-CM | POA: Diagnosis not present

## 2020-05-18 DIAGNOSIS — G319 Degenerative disease of nervous system, unspecified: Secondary | ICD-10-CM | POA: Diagnosis not present

## 2020-05-18 DIAGNOSIS — Y92003 Bedroom of unspecified non-institutional (private) residence as the place of occurrence of the external cause: Secondary | ICD-10-CM | POA: Insufficient documentation

## 2020-05-18 DIAGNOSIS — W19XXXA Unspecified fall, initial encounter: Secondary | ICD-10-CM

## 2020-05-18 NOTE — ED Triage Notes (Signed)
Pt reports mechanical fall this morning, hitting her forehead on the bedpost. Hematoma noted to forehead, pt denies any other injuries from the fall. Pt take Eliquis. Pt a.o, ambulatory.

## 2020-05-18 NOTE — Discharge Instructions (Signed)
CAT scan today showed no signs of internal bleeding in the brain.  Only a bruise on the forehead.  It is ok to sleep and can do normal activity.

## 2020-05-18 NOTE — ED Provider Notes (Signed)
[facility] EMERGENCY DEPARTMENT Provider Note   CSN: [id number] Arrival date & time: 05/18/20  1056     History Chief Complaint  Patient presents with  . Fall    Donna Simmons is a 79 y.o. female.  Patient is a 79 year old female with a history of paroxysmal atrial fibrillation on Eliquis, hypertension, GERD and memory loss who is presenting today after an injury to her head.  She reports she was getting out of bed this morning and there was an object on the floor that she was trying to get out of the way and it caused her to fall.  She reports she hit her head on a piece of furniture and fell to the floor.  She did not lose consciousness but she is having soreness on her forehead where she hit.  She denies any neck pain.  She denies any chest pain, shortness of breath or abdominal pain.  She does not know exactly what time this happened this morning but it was this morning when she was getting out of bed.  She reports her husband usually gives the history because of her memory issues but he is not currently at bedside.  She reports that she feels her normal self except for the ache on her head but otherwise denies any vision changes, unilateral weakness or numbness.  The history is provided by the patient. The history is limited by the absence of a caregiver.  Fall This is a new problem. Episode onset: this morning. The problem occurs constantly. The problem has not changed since onset.Pertinent negatives include no chest pain, no abdominal pain and no shortness of breath. Associated symptoms comments: Pain where she hit her head. Nothing aggravates the symptoms. Nothing relieves the symptoms. She has tried nothing for the symptoms. The treatment provided no relief.       Past Medical History:  Diagnosis Date  . Allergy   . GERD (gastroesophageal reflux disease)   . Heart murmur    No significant valvular lesion noted on echo.  . Hypercholesteremia   .  Hypertension   . Memory loss   . Paroxysmal atrial fibrillation (HCC) 12/27/2008    Patient Active Problem List   Diagnosis Date Noted  . Other forms of angina pectoris (HCC) 07/25/2018  . Long term current use of anticoagulant therapy 11/18/2012  . Hypercholesteremia 09/25/2012  . DDD (degenerative disc disease), cervical 05/19/2012  . Paroxysmal atrial fibrillation (HCC) - CHA2DS2-VASc Score 3, on Eliquis 04/20/2012  . Essential hypertension 04/20/2012    Past Surgical History:  Procedure Laterality Date  . ABDOMINAL HYSTERECTOMY  1980  . BREAST SURGERY    . CT CTA CORONARY W/CA SCORE W/CM &/OR WO/CM  08/2018   Coronary calcium score 24.  Nonobstructive CAD with less than 30% plaque in proximal LAD.  Normal aortic root. ->  Relook in October 2020 had significant motion artifact but coronary calcium score 26.  Marland Kitchen EYE SURGERY Right 12/29/2016  . EYE SURGERY Left 01/19/2017  . FRACTURE SURGERY    . LEFT HEART CATH AND CORONARY ANGIOGRAPHY  12/16/2010   no evidence of CAD to explain anginal pain w/ positive troponin.  potential etiology is breakthrough AF  . NM MYOVIEW LTD  April 2010   EF 64%, normal pattern of perfusion in all regions, no scintigraphic evidence of inducible ischemia; no significant wall motion abnormalities; EKG negative for ischemia; no significant change from last study; low risk scan  . TRANSTHORACIC ECHOCARDIOGRAM  07/2018  Normal EF 55-60%.  No RWMA. GR 1 DD. Ao Sclerosis - no Stenosis. Mild AI. Trivial MR.      OB History   No obstetric history on file.     Family History  Problem Relation Age of Onset  . Arthritis Mother        OSTEO  . Hypertension Mother   . Dementia Mother   . Heart disease Father   . Hypertension Sister   . Dementia Sister   . Cancer - Lung Brother   . Cancer Maternal Grandmother   . Heart disease Maternal Grandfather   . Cancer - Lung Brother   . Hypertension Brother   . Cancer - Prostate Brother     Social History    Tobacco Use  . Smoking status: Never Smoker  . Smokeless tobacco: Never Used  Vaping Use  . Vaping Use: Never used  Substance Use Topics  . Alcohol use: No    Alcohol/week: 0.0 standard drinks  . Drug use: No    Home Medications Prior to Admission medications   Medication Sig Start Date End Date Taking? Authorizing Provider  acetaminophen (TYLENOL) 325 MG tablet Take 325-650 mg by mouth daily as needed (pain or headaches).     [provider]  amLODipine (NORVASC) 10 MG tablet Take 10 mg by mouth daily.  09/14/17   [provider]  Blood Pressure Monitoring (BLOOD PRESSURE CUFF) MISC Take your  blood pressure with blood pressure cuff/moniotor- can be automatic or manual - one to three  times a week as needed 04/20/19   Marykay Lex, MD  ELIQUIS 5 MG TABS tablet Take 1 tablet by mouth twice daily 04/30/20   Marykay Lex, MD  ENSURE (ENSURE) Take 237 mLs by mouth daily.    [provider]  flecainide (TAMBOCOR) 50 MG tablet Take 1 tablet (50 mg total) by mouth 2 (two) times daily. 07/08/19   Marykay Lex, MD  fluticasone (FLONASE) 50 MCG/ACT nasal spray USE TWO SPRAY(S) IN EACH NOSTRIL ONCE DAILY Patient taking differently: Place 2 sprays into both nostrils daily as needed for allergies.  01/05/17   [name], MD  furosemide (LASIX) 20 MG tablet Take 1 tablet by mouth once weekly. 02/13/20   Jodelle Gross, NP  lisinopril (ZESTRIL) 20 MG tablet Take 1 tablet (20 mg total) by mouth daily. 08/04/19   Marykay Lex, MD  memantine [facility]) 5 MG tablet Take 1 tablet daily for one week, then take 1 tablet twice daily for one week, then take 1 tablet in the morning and 2 in the evening for one week, then take 2 tablets twice daily 07/27/19   York Spaniel, MD  metoprolol succinate (TOPROL-XL) 50 MG 24 hr tablet TAKE 1 TABLET BY MOUTH ONCE DAILY TAKE  WITH  OR  IMMEDIATELY  FOLLOWING  A  MEAL Patient taking differently: Take 50 mg by mouth  daily.  09/19/19   Marykay Lex, MD  metoprolol tartrate (LOPRESSOR) 50 MG tablet Take 1 tablet only with flecainide as needed for rapid fast heart rate 08/08/19   Marykay Lex, MD  Multiple Vitamins-Minerals (CENTRUM SILVER 50+WOMEN) TABS Take 1 tablet by mouth daily with lunch.    [provider]  omeprazole (PRILOSEC) 40 MG capsule  01/23/20   [provider]  rosuvastatin (CRESTOR) 20 MG tablet Take 1 tablet (20 mg total) by mouth daily. 08/04/19 04/04/20  Marykay Lex, MD  sucralfate (CARAFATE) 1 GM/10ML suspension  Take 10 mLs (1 g total) by mouth 4 (four) times daily -  with meals and at bedtime. 01/05/18   [name], MD    Allergies    Antihistamines, chlorpheniramine-type  Review of Systems   Review of Systems  Respiratory: Negative for shortness of breath.   Cardiovascular: Negative for chest pain.  Gastrointestinal: Negative for abdominal pain.  Musculoskeletal:       Swelling in bilateral lower ext but unclear duration  All other systems reviewed and are negative.   Physical Exam Updated Vital Signs BP 138/78 (BP Location: Right Arm)   Pulse 69   Temp 98.1 F (36.7 C) (Oral)   Resp 20   Ht 5\' 3"  (1.6 m)   Wt 48.5 kg   SpO2 100%   BMI 18.94 kg/m   Physical Exam Vitals and nursing note reviewed.  Constitutional:      General: She is not in acute distress.    Appearance: Normal appearance. She is well-developed and normal weight.  HENT:     Head: Normocephalic.      Nose: Nose normal.     Mouth/Throat:     Mouth: Mucous membranes are moist.  Eyes:     Extraocular Movements: Extraocular movements intact.     Conjunctiva/sclera: Conjunctivae normal.     Pupils: Pupils are equal, round, and reactive to light.  Cardiovascular:     Rate and Rhythm: Normal rate and regular rhythm.     Pulses: Normal pulses.     Heart sounds: Normal heart sounds. No murmur heard.  No friction rub.  Pulmonary:     Effort: Pulmonary effort is normal.      Breath sounds: Normal breath sounds. No wheezing or rales.  Abdominal:     General: Bowel sounds are normal. There is no distension.     Palpations: Abdomen is soft.     Tenderness: There is no abdominal tenderness. There is no guarding or rebound.  Musculoskeletal:        General: No tenderness. Normal range of motion.     Cervical back: Normal range of motion and neck supple. No tenderness.     Right lower leg: Edema present.     Left lower leg: Edema present.     Comments: Edema present that is mostly non-pitting from the knee down bilaterally.  No erythema, warmth or tenderness with palpation  Skin:    General: Skin is warm and dry.     Capillary Refill: Capillary refill takes less than 2 seconds.     Findings: No rash.  Neurological:     Mental Status: She is alert. Mental status is at baseline.     Cranial Nerves: No cranial nerve deficit.     Sensory: No sensory deficit.     Motor: No weakness.     Comments: Oriented to person and place.  Able to stand and walk without difficulty  Psychiatric:        Mood and Affect: Mood normal.        Behavior: Behavior normal.        Cognition and Memory: Memory is impaired.     ED Results / Procedures / Treatments   Labs (all labs ordered are listed, but only abnormal results are displayed) Labs Reviewed - No data to display  EKG None  Radiology No results found.  CLINICAL DATA: Head trauma, minor. On Eliquis.  EXAM: CT HEAD WITHOUT CONTRAST  TECHNIQUE: Contiguous axial images were obtained from the base of the  skull through the vertex without intravenous contrast.  COMPARISON: 08/09/2019  FINDINGS: Brain: No evidence of acute infarction, hemorrhage, hydrocephalus, extra-axial collection or mass lesion/mass effect. Generalized atrophy. Chronic small vessel ischemia in the deep white matter.  Vascular: No hyperdense vessel or unexpected calcification.  Skull: Large central forehead hematoma. No acute  fracture.  Sinuses/Orbits: Bilateral cataract resection.  IMPRESSION: 1. No evidence of intracranial injury. 2. Forehead hematoma without calvarial fracture. 3. Brain atrophy.   Electronically Signed By: Marnee SpringJonathon Watts M.D. On: 05/18/2020 11:48 Procedures Procedures (including critical care time)  Medications Ordered in ED Medications - No data to display  ED Course  I have reviewed the triage vital signs and the nursing notes.  Pertinent labs & imaging results that were available during my care of the patient were reviewed by me and considered in my medical decision making (see chart for details).    MDM Rules/Calculators/A&P                          Elderly female presenting today after a fall where she hit the front of her head.  She denies loss of consciousness but does take Eliquis.  Her only complaint is soreness at the area where she hit her head.  She is neurovascularly intact at this time.  She has no other findings of trauma.  Patient does have some memory issues and her husband is not currently present.  Will assess to ensure there is no further history that needs to be evaluated.  Patient is a level 2 trauma given head injury, age and anticoagulation.  Head CT was ordered.  12:25 PM Head CT is neg for acute pathology other than  Forehead hematoma.  Pt has been ambulating here and still well appearing.  Spoke with pt's husband and he confirms story and has no further complaints  MDM Number of Diagnoses or Management Options   Amount and/or Complexity of Data Reviewed Tests in the radiology section of CPT: ordered and reviewed Independent visualization of images, tracings, or specimens: yes  Risk of Complications, Morbidity, and/or Mortality Presenting problems: moderate Diagnostic procedures: minimal Management options: minimal  Patient Progress Patient progress: stable   Final Clinical Impression(s) / ED Diagnoses Final diagnoses:  Traumatic hematoma of  forehead, initial encounter  Fall, initial encounter    Rx / DC Orders ED Discharge Orders    None       Gwyneth SproutPlunkett, Dushaun Okey, MD 05/18/20 1230

## 2020-06-13 ENCOUNTER — Institutional Professional Consult (permissible substitution): Payer: Medicare PPO | Admitting: Neurology

## 2020-06-21 ENCOUNTER — Other Ambulatory Visit: Payer: Self-pay

## 2020-06-21 ENCOUNTER — Ambulatory Visit: Payer: Medicare PPO | Admitting: Sports Medicine

## 2020-06-21 ENCOUNTER — Encounter: Payer: Self-pay | Admitting: Sports Medicine

## 2020-06-21 DIAGNOSIS — B351 Tinea unguium: Secondary | ICD-10-CM | POA: Diagnosis not present

## 2020-06-21 DIAGNOSIS — M79674 Pain in right toe(s): Secondary | ICD-10-CM

## 2020-06-21 DIAGNOSIS — I890 Lymphedema, not elsewhere classified: Secondary | ICD-10-CM | POA: Diagnosis not present

## 2020-06-21 DIAGNOSIS — R413 Other amnesia: Secondary | ICD-10-CM

## 2020-06-21 DIAGNOSIS — I739 Peripheral vascular disease, unspecified: Secondary | ICD-10-CM

## 2020-06-21 DIAGNOSIS — M79675 Pain in left toe(s): Secondary | ICD-10-CM | POA: Diagnosis not present

## 2020-06-21 NOTE — Progress Notes (Signed)
Subjective: Donna Simmons is a 79 y.o. female patient seen today in office with complaint of mildly painful thickened and elongated toenails; unable to trim. Patient denies history of Diabetes, Neuropathy, or Vascular disease but has swelling that has been getting worse over the last month in both legs, Denies pain or SOB. Patient has no other pedal complaints at this time.   Review of Systems  All other systems reviewed and are negative.   Patient Active Problem List   Diagnosis Date Noted  . Other forms of angina pectoris (HCC) 07/25/2018  . Long term current use of anticoagulant therapy 11/18/2012  . Hypercholesteremia 09/25/2012  . DDD (degenerative disc disease), cervical 05/19/2012  . Paroxysmal atrial fibrillation (HCC) - CHA2DS2-VASc Score 3, on Eliquis 04/20/2012  . Essential hypertension 04/20/2012    Current Outpatient Medications on File Prior to Visit  Medication Sig Dispense Refill  . acetaminophen (TYLENOL) 325 MG tablet Take 325-650 mg by mouth daily as needed (pain or headaches).     Marland Kitchen amLODipine (NORVASC) 10 MG tablet Take 10 mg by mouth daily.     . Blood Pressure Monitoring (BLOOD PRESSURE CUFF) MISC Take your  blood pressure with blood pressure cuff/moniotor- can be automatic or manual - one to three  times a week as needed 1 each 0  . ELIQUIS 5 MG TABS tablet Take 1 tablet by mouth twice daily 180 tablet 0  . ENSURE (ENSURE) Take 237 mLs by mouth daily.    . flecainide (TAMBOCOR) 50 MG tablet Take 1 tablet (50 mg total) by mouth 2 (two) times daily. 180 tablet 3  . fluticasone (FLONASE) 50 MCG/ACT nasal spray USE TWO SPRAY(S) IN EACH NOSTRIL ONCE DAILY (Patient taking differently: Place 2 sprays into both nostrils daily as needed for allergies. ) 16 g 1  . furosemide (LASIX) 20 MG tablet Take 1 tablet by mouth once weekly. 30 tablet 0  . lisinopril (ZESTRIL) 20 MG tablet Take 1 tablet (20 mg total) by mouth daily. 90 tablet 3  . memantine (NAMENDA) 5 MG tablet  Take 1 tablet daily for one week, then take 1 tablet twice daily for one week, then take 1 tablet in the morning and 2 in the evening for one week, then take 2 tablets twice daily 70 tablet 0  . metoprolol succinate (TOPROL-XL) 50 MG 24 hr tablet TAKE 1 TABLET BY MOUTH ONCE DAILY TAKE  WITH  OR  IMMEDIATELY  FOLLOWING  A  MEAL (Patient taking differently: Take 50 mg by mouth daily. ) 90 tablet 3  . metoprolol tartrate (LOPRESSOR) 50 MG tablet Take 1 tablet only with flecainide as needed for rapid fast heart rate 10 tablet 6  . Multiple Vitamins-Minerals (CENTRUM SILVER 50+WOMEN) TABS Take 1 tablet by mouth daily with lunch.    Marland Kitchen omeprazole (PRILOSEC) 40 MG capsule     . rosuvastatin (CRESTOR) 20 MG tablet Take 1 tablet (20 mg total) by mouth daily. 90 tablet 3  . sucralfate (CARAFATE) 1 GM/10ML suspension Take 10 mLs (1 g total) by mouth 4 (four) times daily -  with meals and at bedtime. 420 mL 0   No current facility-administered medications on file prior to visit.    Allergies  Allergen Reactions  . Antihistamines, Chlorpheniramine-Type Palpitations    Makes heart beat fast    Objective: Physical Exam  General: Well developed, nourished, no acute distress, awake, alert and oriented x 2 history of memory problems  Vascular: Dorsalis pedis artery 0/4 bilateral, Posterior tibial  artery 0/4 bilateral, skin temperature warm to warm proximal to distal bilateral lower extremities, there is trophic skin changes consistent with lymphedema noted bilateral.  Neurological: Gross sensation present via light touch bilateral.   Dermatological: Skin is warm, dry, and supple bilateral, Nails 1-10 are tender, long, thick, and discolored with mild subungal debris, no webspace macerations present bilateral, no open lesions present bilateral, no callus/corns/hyperkeratotic tissue present bilateral.  Old surgical scars noted to left ankle.  No signs of infection bilateral.  Musculoskeletal: Asymptomatic pes  planus boney deformities noted bilateral. Muscular strength within normal limits without pain on range of motion. No pain with calf compression bilateral.  Assessment and Plan:  Problem List Items Addressed This Visit    None    Visit Diagnoses    Pain due to onychomycosis of toenails of both feet    -  Primary   PVD (peripheral vascular disease) (HCC)       Lymphedema       Memory difficulties          -Examined patient.  -Discussed treatment options for painful mycotic nails and lymphedema. -Mechanically debrided and reduced mycotic nails with sterile nail nipper and dremel nail file without incident. -Dispensed Surgigrip compression sleeve for patient to wear as instructed for edema control advised elevation limit sodium intake and to also discuss swelling in her legs with her PCP for possible medication management -Patient to return in 3 months for follow up evaluation or sooner if symptoms worsen.  Asencion Islam, DPM

## 2020-06-29 DIAGNOSIS — N3281 Overactive bladder: Secondary | ICD-10-CM | POA: Diagnosis not present

## 2020-06-29 DIAGNOSIS — R311 Benign essential microscopic hematuria: Secondary | ICD-10-CM | POA: Diagnosis not present

## 2020-07-02 ENCOUNTER — Other Ambulatory Visit: Payer: Self-pay | Admitting: Cardiology

## 2020-07-12 DIAGNOSIS — Z961 Presence of intraocular lens: Secondary | ICD-10-CM | POA: Diagnosis not present

## 2020-07-17 DIAGNOSIS — Z888 Allergy status to other drugs, medicaments and biological substances status: Secondary | ICD-10-CM | POA: Diagnosis not present

## 2020-07-17 DIAGNOSIS — G301 Alzheimer's disease with late onset: Secondary | ICD-10-CM | POA: Diagnosis not present

## 2020-07-17 DIAGNOSIS — R441 Visual hallucinations: Secondary | ICD-10-CM | POA: Diagnosis not present

## 2020-07-17 DIAGNOSIS — F0281 Dementia in other diseases classified elsewhere with behavioral disturbance: Secondary | ICD-10-CM | POA: Diagnosis not present

## 2020-08-02 ENCOUNTER — Other Ambulatory Visit: Payer: Self-pay

## 2020-08-02 MED ORDER — FUROSEMIDE 20 MG PO TABS: ORAL_TABLET | ORAL | 3 refills | Status: DC

## 2020-08-07 ENCOUNTER — Other Ambulatory Visit: Payer: Self-pay | Admitting: Cardiology

## 2020-08-07 ENCOUNTER — Encounter: Payer: Self-pay | Admitting: Cardiology

## 2020-08-07 ENCOUNTER — Other Ambulatory Visit: Payer: Self-pay

## 2020-08-07 ENCOUNTER — Ambulatory Visit (INDEPENDENT_AMBULATORY_CARE_PROVIDER_SITE_OTHER): Payer: Medicare PPO | Admitting: Cardiology

## 2020-08-07 VITALS — BP 130/80 | HR 69 | Ht 62.0 in | Wt 127.0 lb

## 2020-08-07 DIAGNOSIS — I10 Essential (primary) hypertension: Secondary | ICD-10-CM | POA: Diagnosis not present

## 2020-08-07 DIAGNOSIS — R600 Localized edema: Secondary | ICD-10-CM

## 2020-08-07 DIAGNOSIS — Z7901 Long term (current) use of anticoagulants: Secondary | ICD-10-CM | POA: Diagnosis not present

## 2020-08-07 DIAGNOSIS — E7800 Pure hypercholesterolemia, unspecified: Secondary | ICD-10-CM | POA: Diagnosis not present

## 2020-08-07 DIAGNOSIS — I480 Paroxysmal atrial fibrillation: Secondary | ICD-10-CM | POA: Diagnosis not present

## 2020-08-07 MED ORDER — FUROSEMIDE 20 MG PO TABS: ORAL_TABLET | ORAL | 3 refills | Status: DC

## 2020-08-07 NOTE — Patient Instructions (Signed)
Medication Instructions:  TAKE 20 MG FUROSEMIDE (LASIX ) EVERYDAY FOR  7 DAYS THEN  START TAKING EVERY MONDAY, Wednesday , AND Friday  AND AS NEED FOR SWELLING.  *If you need a refill on your cardiac medications before your next appointment, please call your pharmacy*   Lab Work: NOT NEEDED   Testing/Procedures: NOT NEEDED  Follow-Up: At [facility], you and your health needs are our priority.  As part of our continuing mission to provide you with exceptional heart care, we have created designated Provider Care Teams.  These Care Teams include your primary Cardiologist (physician) and Advanced Practice Providers (APPs -  Physician Assistants and Nurse Practitioners) who all work together to provide you with the care you need, when you need it.     Your next appointment:   6 month(s)  The format for your next appointment:   In Person  Provider:   You will see one of the following Advanced Practice Providers on your designated Care Team:    Theodore Demark, PA-C  Joni Reining, DNP, ANP  Then, Bryan Lemma, MD will plan to see you again in 12 month(s).   Other Instructions  PURCHASE AND WEAR SUPPORT /COMPRESSION SOCKS  15 - 20 MMHG  recommends you purchase some compression  socks/hose from [employer] [facility] in [place] ,[place]. You do not need an prescription to purchase the items.  Address  [address] [place], [place] [address]  Phone  [phone]   Compression   strength    8-15 mmHg x15-20 mmHg                           20-30 mmHg  30-40 mmHg.  You may also try a medical supply store, department store (i.e.- [employer], [employer], [employer], specialty shoe stores ( shoe market), [employer] and [employer]) or  [employer] uniform store.

## 2020-08-07 NOTE — Progress Notes (Signed)
Primary Care Provider: Georgina Quint, MD Cardiologist: Bryan Lemma, MD Electrophysiologist: None  Clinic Note: Chief Complaint  Patient presents with  . Follow-up    28-month, and hospital  . Atrial Fibrillation    Paroxysmal.  Stable   Problem List Items Addressed This Visit    Paroxysmal atrial fibrillation (HCC) - CHA2DS2-VASc Score 3, on Eliquis - Primary (Chronic)   Other Relevant Orders   EKG 12-Lead (Completed)   Essential hypertension (Chronic)   Relevant Medications   furosemide (LASIX) 20 MG tablet     HPI:    Donna Simmons is a 79 y.o. female with a PMH notable for PAF (on Eliquis) Frequent PACs, HLD and HTN with coronary calcium score of 26.  Who presents today for 72-month follow-up.  I last saw [name] on August 04, 2019-no complaints of dyspnea although did have some brief episodes of irregular heartbeats, but not to be symptomatic.  No excessive bruising.  Walking on treadmill for exercise (was too scared to go to the gym during pandemic).  Continued on Toprol plus low-dose flecainide.  Plan was for PRN additional 5 mg flecainide plus Toprol.  Statin therapy discontinued because of memory issues.  IO DIEUJUSTE was last seen on on February 13, 2020 by Bailey Mech, NP.  At that time she was accompanied by her husband to help out with some of the memory issues.  No chest pain, palpitations or dizziness.  Remain active.  Medically compliant.  Edema somewhat worsening during the day.  Recent Hospitalizations:   May 05, 2020: Patient's husband called EMS because she had left home and he could not find her.  She has about 3 blocks away, without applied by bystanders.  Recommend having hallucinations (feeling like there is someone else in the home).  May 18, 2020: Fall with head injury.  No LOC.  Negative CT  Reviewed  CV studies:    The following studies were reviewed today: (if available, images/films reviewed: From Epic Chart or Care  Everywhere) . None:  Interval History:   [name] is here today for routine follow-up.  Unfortunately she is not accompanied by her husband.  She is not the greatest of historians, but from what she can tell me she is not having any notable symptoms of exertional dyspnea.  She has not felt any irregular heartbeats or palpitations.  She says that she is involved in a treatment clinic in [facility] for management of her Alzheimer's.  She is only been taking her Lasix once a week, and has had pretty much persistent edema but does not necessarily go down much with her feet up.  She has not really been wearing support hose.  No PND orthopnea or edema.  No signs or symptoms of recurrent A. fib.  No chest pain or pressure.  CV Review of Symptoms (Summary): no chest pain or dyspnea on exertion positive for - edema, palpitations and Has not really been all that active, husband is concerned about her walking by herself. negative for - irregular heartbeat, orthopnea, paroxysmal nocturnal dyspnea, rapid heart rate, shortness of breath or Syncope or near syncope, TIA/amaurosis fugax, claudication  The patient does not have symptoms concerning for COVID-19 infection (fever, chills, cough, or new shortness of breath).   REVIEWED OF SYSTEMS   Review of Systems  Constitutional: Negative for malaise/fatigue and weight loss (Is actually gained some weight).  HENT: Negative for congestion and sinus pain.   Respiratory: Negative for cough and shortness  of breath.   Cardiovascular: Positive for leg swelling.  Gastrointestinal: Negative for blood in stool and melena.  Genitourinary: Negative for hematuria.  Musculoskeletal: Positive for falls (None since the 1 ER visit).  Neurological: Positive for dizziness (Intermittent). Negative for tremors, focal weakness, loss of consciousness and headaches.  Psychiatric/Behavioral: Positive for hallucinations and memory loss. Negative for depression. The patient  has insomnia.    I have reviewed and (if needed) personally updated the patient's problem list, medications, allergies, past medical and surgical history, social and family history.   PAST MEDICAL HISTORY   Past Medical History:  Diagnosis Date  . Allergy   . GERD (gastroesophageal reflux disease)   . Heart murmur    No significant valvular lesion noted on echo.  . Hypercholesteremia   . Hypertension   . Memory loss   . Paroxysmal atrial fibrillation (HCC) 12/27/2008    PAST SURGICAL HISTORY   Past Surgical History:  Procedure Laterality Date  . ABDOMINAL HYSTERECTOMY  1980  . BREAST SURGERY    . CT CTA CORONARY W/CA SCORE W/CM &/OR WO/CM  08/2018   Coronary calcium score 24.  Nonobstructive CAD with less than 30% plaque in proximal LAD.  Normal aortic root. ->  Relook in October 2020 had significant motion artifact but coronary calcium score 26.  Marland Kitchen EYE SURGERY Right 12/29/2016  . EYE SURGERY Left 01/19/2017  . FRACTURE SURGERY    . LEFT HEART CATH AND CORONARY ANGIOGRAPHY  12/16/2010   no evidence of CAD to explain anginal pain w/ positive troponin.  potential etiology is breakthrough AF  . NM MYOVIEW LTD  April 2010   EF 64%, normal pattern of perfusion in all regions, no scintigraphic evidence of inducible ischemia; no significant wall motion abnormalities; EKG negative for ischemia; no significant change from last study; low risk scan  . TRANSTHORACIC ECHOCARDIOGRAM  07/2018   Normal EF 55-60%.  No RWMA. GR 1 DD. Ao Sclerosis - no Stenosis. Mild AI. Trivial MR.     Immunization History  Administered Date(s) Administered  . Fluad Quad(high Dose 65+) 06/06/2019  . Hepatitis A 03/22/1998  . Influenza Split 08/02/2012, 06/20/2014  . Influenza,inj,Quad PF,6+ Mos 07/21/2013, 06/04/2015, 06/21/2016, 05/21/2017, 04/26/2018  . Influenza-Unspecified 06/01/2014  . PPD Test 04/26/2013  . Pneumococcal Conjugate-13 08/29/2015  . Pneumococcal Polysaccharide-23 09/01/2005  . Td  03/22/1998, 07/31/2010  . Tdap 09/01/2009  . Zoster 08/02/2012    MEDICATIONS/ALLERGIES   Current Meds  Medication Sig  . acetaminophen (TYLENOL) 325 MG tablet Take 325-650 mg by mouth daily as needed (pain or headaches).   Marland Kitchen amLODipine (NORVASC) 10 MG tablet Take 10 mg by mouth daily.   . Blood Pressure Monitoring (BLOOD PRESSURE CUFF) MISC Take your  blood pressure with blood pressure cuff/moniotor- can be automatic or manual - one to three  times a week as needed  . ENSURE (ENSURE) Take 237 mLs by mouth daily.  . flecainide (TAMBOCOR) 50 MG tablet Take 1 tablet by mouth twice daily  . fluticasone (FLONASE) 50 MCG/ACT nasal spray USE TWO SPRAY(S) IN EACH NOSTRIL ONCE DAILY (Patient taking differently: Place 2 sprays into both nostrils daily as needed for allergies. )  . furosemide (LASIX) 20 MG tablet Take 1 tablet by mouth every Monday, Wednesday and Friday and/ or as needed for swelling.  . memantine (NAMENDA) 5 MG tablet Take 1 tablet daily for one week, then take 1 tablet twice daily for one week, then take 1 tablet in the morning and 2 in  the evening for one week, then take 2 tablets twice daily  . metoprolol succinate (TOPROL-XL) 50 MG 24 hr tablet TAKE 1 TABLET BY MOUTH ONCE DAILY TAKE  WITH  OR  IMMEDIATELY  FOLLOWING  A  MEAL (Patient taking differently: Take 50 mg by mouth daily. )  . metoprolol tartrate (LOPRESSOR) 50 MG tablet Take 1 tablet only with flecainide as needed for rapid fast heart rate  . Multiple Vitamins-Minerals (CENTRUM SILVER 50+WOMEN) TABS Take 1 tablet by mouth daily with lunch.  Marland Kitchen. omeprazole (PRILOSEC) 40 MG capsule   . sucralfate (CARAFATE) 1 GM/10ML suspension Take 10 mLs (1 g total) by mouth 4 (four) times daily -  with meals and at bedtime.  . [DISCONTINUED] ELIQUIS 5 MG TABS tablet Take 1 tablet by mouth twice daily  . [DISCONTINUED] furosemide (LASIX) 20 MG tablet Take 1 tablet by mouth once weekly.  . [DISCONTINUED] lisinopril (ZESTRIL) 20 MG tablet  Take 1 tablet (20 mg total) by mouth daily.    Allergies  Allergen Reactions  . Antihistamines, Chlorpheniramine-Type Palpitations    Makes heart beat fast    SOCIAL HISTORY/FAMILY HISTORY   Reviewed in Epic:  Pertinent findings: Has progressing signs of dementia; has [facility]  OBJCTIVE -PE, EKG, labs   Wt Readings from Last 3 Encounters:  08/07/20 127 lb (57.6 kg)  05/18/20 106 lb 14.8 oz (48.5 kg)  04/17/20 107 lb (48.5 kg)    Physical Exam: BP 130/80   Pulse 69   Ht 5\' 2"  (1.575 m)   Wt 127 lb (57.6 kg)   SpO2 97%   BMI 23.23 kg/m  Physical Exam Constitutional:      General: She is not in acute distress.    Appearance: Normal appearance. She is normal weight. She is not ill-appearing or toxic-appearing.     Comments: Otherwise healthy-appearing; well-groomed.  HENT:     Head: Normocephalic and atraumatic.  Neck:     Vascular: No carotid bruit.  Cardiovascular:     Rate and Rhythm: Normal rate and regular rhythm.     Pulses: Normal pulses.     Heart sounds: Murmur (Soft 1/6 SEM at RUSB.) heard.  No friction rub. No gallop.   Pulmonary:     Effort: Pulmonary effort is normal. No respiratory distress.     Breath sounds: Normal breath sounds.  Chest:     Chest wall: No tenderness.  Musculoskeletal:        General: Swelling (Bilateral 1-2+ pitting edema with mild spider and varicose veins.  No discoloration.) present. Normal range of motion.     Cervical back: Normal range of motion and neck supple.  Neurological:     General: No focal deficit present.     Mental Status: She is alert and oriented to person, place, and time.     Comments: Poor memory  Psychiatric:        Mood and Affect: Mood normal.        Thought Content: Thought content normal.        Judgment: Judgment normal.     Comments: A little confused.  Repeats things that are said to her in order to help her remember.  Seems to forget earlier partially  conversation.     Adult ECG Report  Rate: 69 ;  Rhythm: normal sinus rhythm and Normal axis, intervals and durations.;   Narrative Interpretation: Stable  Recent Labs: Reviewed Lab Results  Component Value Date   CHOL  157 04/04/2020   HDL 71 04/04/2020   LDLCALC 74 04/04/2020   TRIG 60 04/04/2020   CHOLHDL 2.2 04/04/2020   Lab Results  Component Value Date   CREATININE 1.02 (H) 04/04/2020   BUN 15 04/04/2020   NA 138 04/04/2020   K 4.4 04/04/2020   CL 102 04/04/2020   CO2 23 04/04/2020   Lab Results  Component Value Date   TSH 1.160 04/04/2020    ASSESSMENT/PLAN    Problem List Items Addressed This Visit    Paroxysmal atrial fibrillation (HCC) - CHA2DS2-VASc Score 3, on Eliquis - Primary (Chronic)    Well-controlled on flecainide and Toprol.  Remains on Eliquis without any bleeding issues.  She has plan for breakthrough A. fib being additional dose of flecainide plus short acting beta-blocker 50 mg Lopressor with the potential for cardioversion if that does not work.       Relevant Medications   furosemide (LASIX) 20 MG tablet   Other Relevant Orders   EKG 12-Lead (Completed)   Essential hypertension (Chronic)    Blood pressure actually is pretty good today on current dose of amlodipine, lisinopril and Toprol.  No change      Relevant Medications   furosemide (LASIX) 20 MG tablet   Other Relevant Orders   EKG 12-Lead (Completed)   Hypercholesteremia (Chronic)    Most recent Impella looks great on current dose of rosuvastatin.  It seems like this did make a difference for her memory issues.  Therefore it was really started.    It would be okay to stop if there is at all concerns that this could exacerbate her dementia.      Relevant Medications   furosemide (LASIX) 20 MG tablet   Long term current use of anticoagulant therapy (Chronic)    Normal renal function borderline weight.  Okay to remain on 5 mg twice daily Eliquis.  No bleeding issues.       Bilateral lower extremity edema (Chronic)    Probably related to venous stasis.  She is also on amlodipine.  She does have some mild varicose veins but nothing significant.  Plan: I did recommend she use support stockings (could consider the zipper style).  I would like her to take Lasix at least 3 days a week, will start with with taking it daily for 1 week and then switched to Monday/Wednesday/Friday.          COVID-19 Education: The signs and symptoms of COVID-19 were discussed with the patient and how to seek care for testing (follow up with PCP or arrange E-visit).   The importance of social distancing and COVID-19 vaccination was discussed today. 1 min The patient is practicing social distancing & Masking.   I spent a total of with the patient spent in direct patient consultation.  Additional time spent with chart review  / charting (studies, outside notes, etc): 10 Total Time: 25 min   Current medicines are reviewed at length with the patient today.  (+/- concerns) n/a  This visit occurred during the SARS-CoV-2 public health emergency.  Safety protocols were in place, including screening questions prior to the visit, additional usage of staff PPE, and extensive cleaning of exam room while observing appropriate contact time as indicated for disinfecting solutions.  Notice: This dictation was prepared with Dragon dictation along with smaller phrase technology. Any transcriptional errors that result from this process are unintentional and may not be corrected upon review.  Patient Instructions / Medication Changes & Studies &  Tests Ordered   Patient Instructions  Medication Instructions:  TAKE 20 MG FUROSEMIDE (LASIX ) EVERYDAY FOR  7 DAYS THEN  START TAKING EVERY MONDAY, Wednesday , AND Friday  AND AS NEED FOR SWELLING.  *If you need a refill on your cardiac medications before your next appointment, please call your pharmacy*   Lab Work: NOT  NEEDED   Testing/Procedures: NOT NEEDED  Follow-Up: At [facility], you and your health needs are our priority.  As part of our continuing mission to provide you with exceptional heart care, we have created designated Provider Care Teams.  These Care Teams include your primary Cardiologist (physician) and Advanced Practice Providers (APPs -  Physician Assistants and Nurse Practitioners) who all work together to provide you with the care you need, when you need it.     Your next appointment:   6 month(s)  The format for your next appointment:   In Person  Provider:   You will see one of the following Advanced Practice Providers on your designated Care Team:    Theodore Demark, PA-C  Joni Reining, DNP, ANP  Then, Bryan Lemma, MD will plan to see you again in 12 month(s).   Other Instructions  PURCHASE AND WEAR SUPPORT /COMPRESSION SOCKS  15 - 20 MMHG  recommends you purchase some compression  socks/hose from [employer] in [place] ,[place]. You do not need an prescription to purchase the items.  Address  [address] [place], [place] [address]  Phone  [phone]   Compression   strength    8-15 mmHg x15-20 mmHg                           20-30 mmHg  30-40 mmHg.  You may also try a medical supply store, department store (i.e.- [employer], [employer], [employer], specialty shoe stores ( shoe market), [employer] and [employer]) or  [employer] uniform store.    Studies Ordered:   Orders Placed This Encounter  Procedures  . EKG 12-Lead     Bryan Lemma, M.D., M.S. Interventional Cardiologist   Pager # [phone] Phone # [phone] [address] [place], [place] [address]   Thank you for choosing Heartcare at [facility]!!

## 2020-08-10 ENCOUNTER — Other Ambulatory Visit: Payer: Self-pay | Admitting: Cardiology

## 2020-08-10 NOTE — Telephone Encounter (Signed)
Prescription refill request for Eliquis received. Indication: Atrial Fibrillation Last office visit: 08/2020 Herbie Baltimore Scr: 1.02  04/2020 Age: 79 Weight: 57.6 kg  Prescription refilled

## 2020-08-12 ENCOUNTER — Other Ambulatory Visit: Payer: Self-pay | Admitting: Cardiology

## 2020-08-12 IMAGING — DX DG CHEST 2V
2 series · 2 of 2 positions shown · non-contrast
Comparison: Chest radiograph July 25, 2018

CLINICAL DATA: Atrial fibrillation, shortness of breath.

EXAM:
CHEST - 2 VIEW

[chest pa]
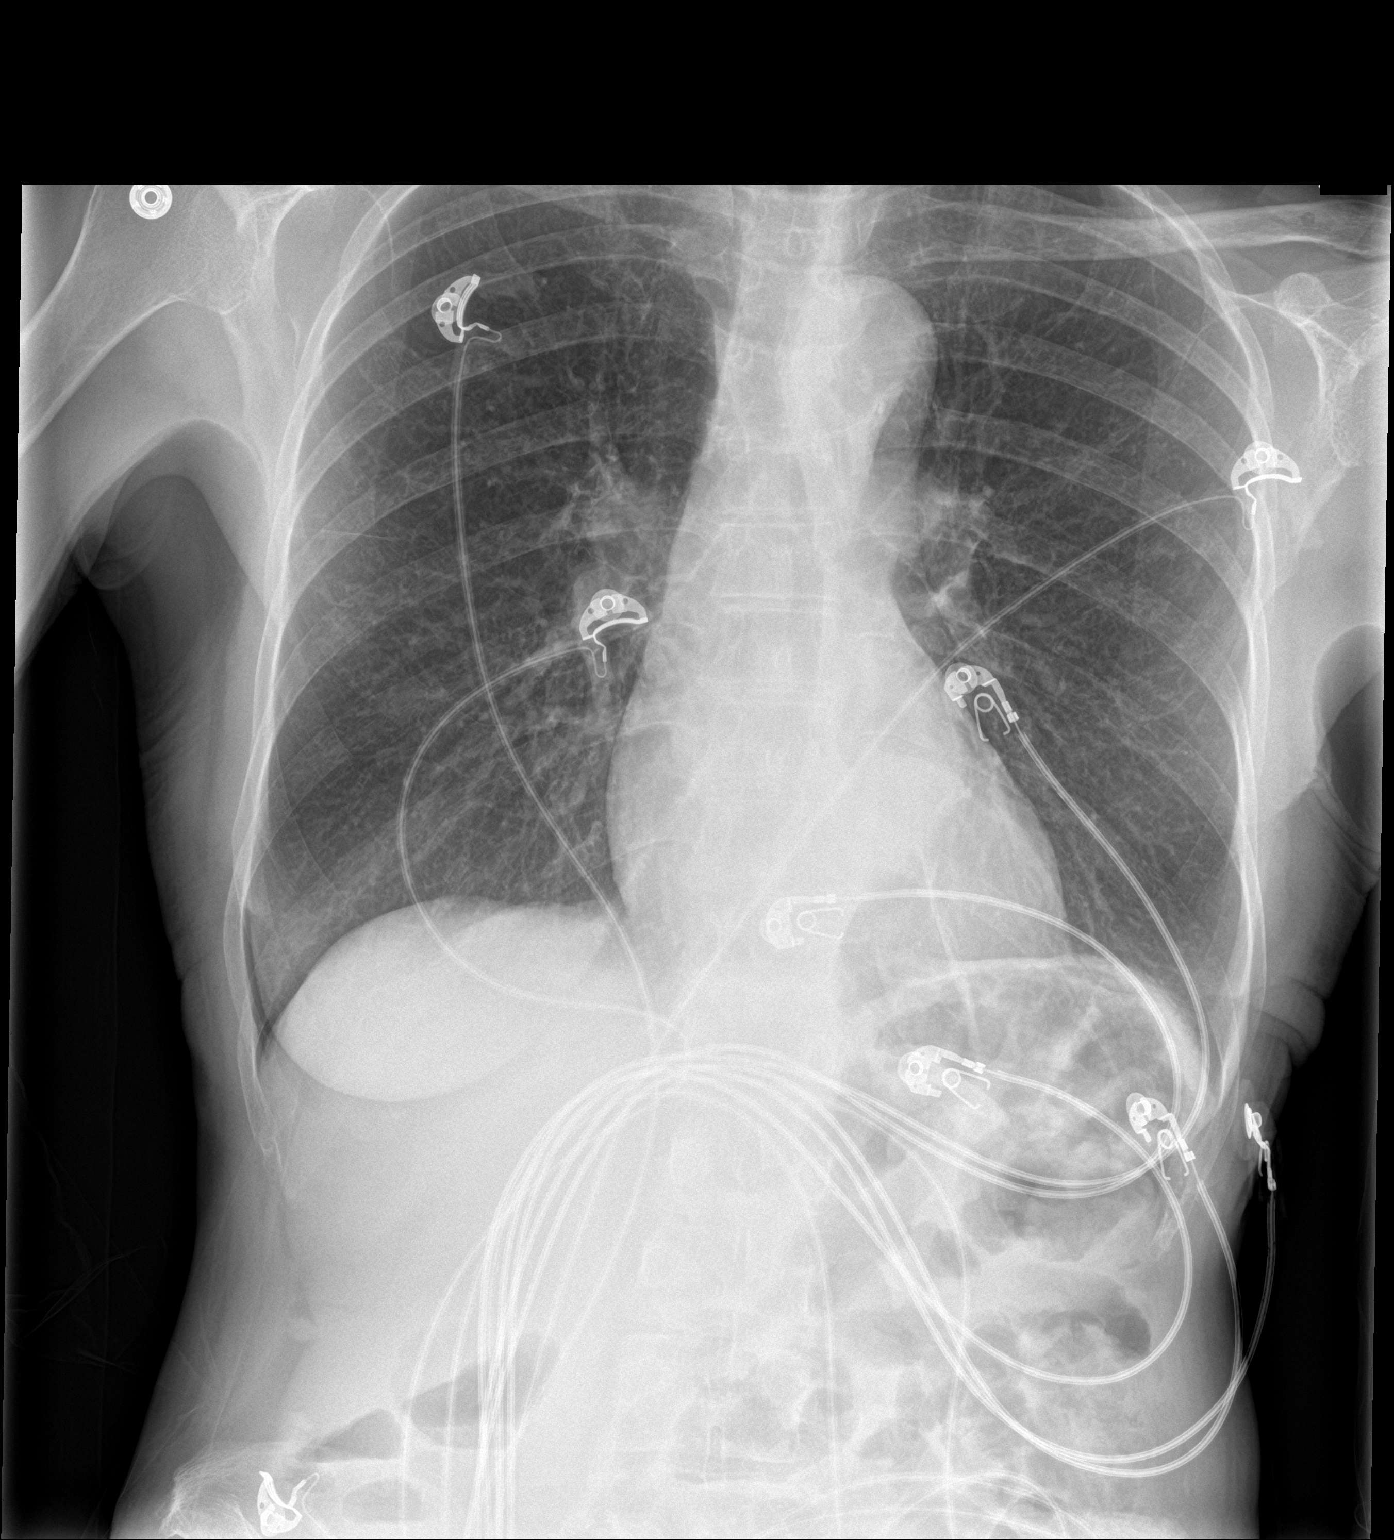

[chest lat]
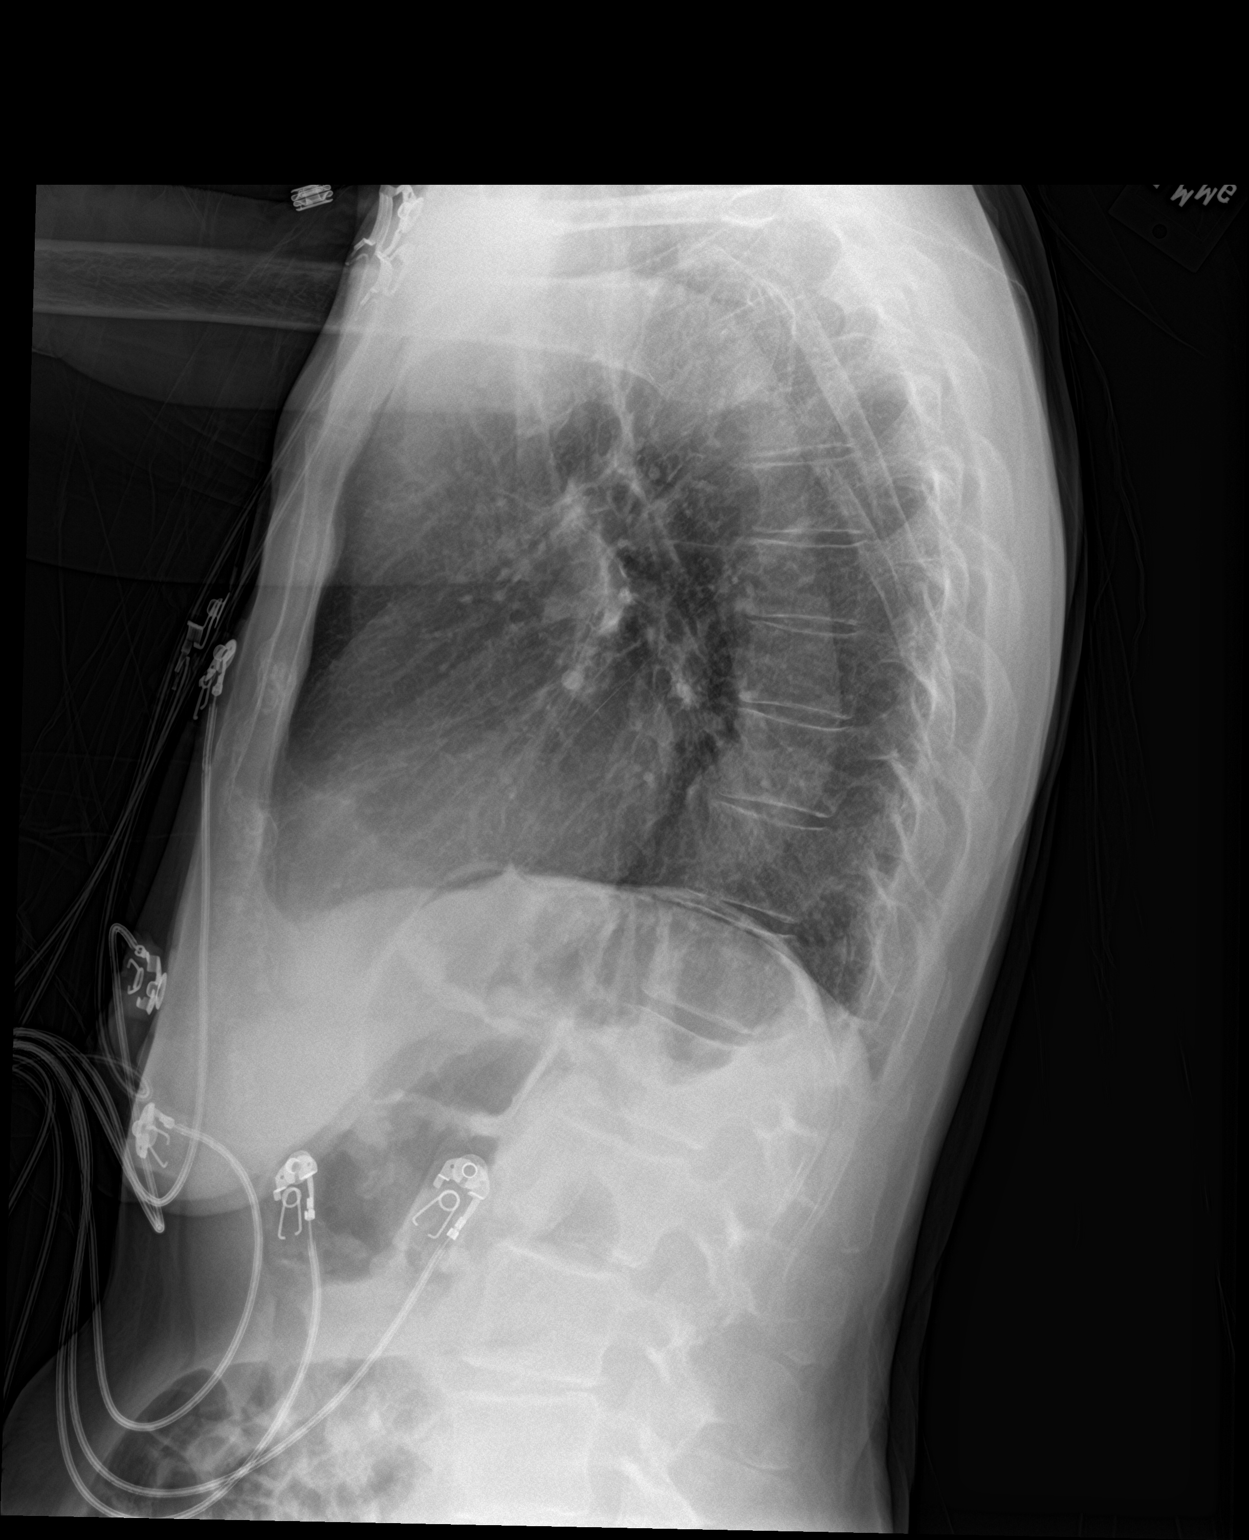

[2 of 2 positions shown; findings below may reference images not displayed]

FINDINGS: Cardiomediastinal silhouette is normal. Mildly calcified aortic
arch. No pleural effusions or focal consolidations. Trachea projects
midline and there is no pneumothorax. Soft tissue planes and
included osseous structures are non-suspicious.
IMPRESSION: 1. No acute cardiopulmonary process.
2.  Aortic Atherosclerosis ([id number].E).

## 2020-08-19 ENCOUNTER — Encounter: Payer: Self-pay | Admitting: Cardiology

## 2020-08-19 DIAGNOSIS — R600 Localized edema: Secondary | ICD-10-CM | POA: Insufficient documentation

## 2020-08-19 NOTE — Assessment & Plan Note (Signed)
Well-controlled on flecainide and Toprol.  Remains on Eliquis without any bleeding issues.  She has plan for breakthrough A. fib being additional dose of flecainide plus short acting beta-blocker 50 mg Lopressor with the potential for cardioversion if that does not work.

## 2020-08-19 NOTE — Assessment & Plan Note (Signed)
Blood pressure actually is pretty good today on current dose of amlodipine, lisinopril and Toprol.  No change

## 2020-08-19 NOTE — Assessment & Plan Note (Signed)
Probably related to venous stasis.  She is also on amlodipine.  She does have some mild varicose veins but nothing significant.  Plan: I did recommend she use support stockings (could consider the zipper style).  I would like her to take Lasix at least 3 days a week, will start with with taking it daily for 1 week and then switched to Monday/Wednesday/Friday.

## 2020-08-19 NOTE — Assessment & Plan Note (Signed)
Normal renal function borderline weight.  Okay to remain on 5 mg twice daily Eliquis.  No bleeding issues.

## 2020-08-19 NOTE — Assessment & Plan Note (Signed)
Most recent Impella looks great on current dose of rosuvastatin.  It seems like this did make a difference for her memory issues.  Therefore it was really started.    It would be okay to stop if there is at all concerns that this could exacerbate her dementia.

## 2020-08-30 ENCOUNTER — Emergency Department (HOSPITAL_COMMUNITY)
Admission: EM | Admit: 2020-08-30 | Discharge: 2020-08-30 | Disposition: A | Discharge status: Home / Self Care | Payer: Medicare PPO | Attending: Emergency Medicine | Admitting: Emergency Medicine

## 2020-08-30 ENCOUNTER — Encounter (HOSPITAL_COMMUNITY): Payer: Self-pay | Admitting: Emergency Medicine

## 2020-08-30 ENCOUNTER — Other Ambulatory Visit: Payer: Self-pay

## 2020-08-30 DIAGNOSIS — R1084 Generalized abdominal pain: Secondary | ICD-10-CM | POA: Diagnosis not present

## 2020-08-30 DIAGNOSIS — Z7901 Long term (current) use of anticoagulants: Secondary | ICD-10-CM | POA: Insufficient documentation

## 2020-08-30 DIAGNOSIS — N3001 Acute cystitis with hematuria: Secondary | ICD-10-CM | POA: Insufficient documentation

## 2020-08-30 DIAGNOSIS — K219 Gastro-esophageal reflux disease without esophagitis: Secondary | ICD-10-CM | POA: Diagnosis not present

## 2020-08-30 DIAGNOSIS — R52 Pain, unspecified: Secondary | ICD-10-CM | POA: Diagnosis not present

## 2020-08-30 DIAGNOSIS — I10 Essential (primary) hypertension: Secondary | ICD-10-CM | POA: Diagnosis not present

## 2020-08-30 DIAGNOSIS — Z79899 Other long term (current) drug therapy: Secondary | ICD-10-CM | POA: Insufficient documentation

## 2020-08-30 DIAGNOSIS — R1030 Lower abdominal pain, unspecified: Secondary | ICD-10-CM | POA: Diagnosis present

## 2020-08-30 DIAGNOSIS — I480 Paroxysmal atrial fibrillation: Secondary | ICD-10-CM | POA: Diagnosis not present

## 2020-08-30 LAB — URINALYSIS, ROUTINE W REFLEX MICROSCOPIC
Bacteria, UA: NONE SEEN
Bilirubin Urine: NEGATIVE
Glucose, UA: NEGATIVE mg/dL
Ketones, ur: NEGATIVE mg/dL
Nitrite: NEGATIVE
Protein, ur: NEGATIVE mg/dL
Specific Gravity, Urine: 1.02 (ref 1.005–1.030)
WBC, UA: >50 WBC/hpf — ABNORMAL HIGH (ref 0–5)
pH: 5 (ref 5.0–8.0)

## 2020-08-30 LAB — CBC
HCT: 34.3 % — ABNORMAL LOW (ref 36.0–46.0)
Hemoglobin: 10.8 g/dL — ABNORMAL LOW (ref 12.0–15.0)
MCH: 25.7 pg — ABNORMAL LOW (ref 26.0–34.0)
MCHC: 31.5 g/dL (ref 30.0–36.0)
MCV: 81.5 fL (ref 80.0–100.0)
Platelets: 207 10*3/uL (ref 150–400)
RBC: 4.21 MIL/uL (ref 3.87–5.11)
RDW: 14.5 % (ref 11.5–15.5)
WBC: 3.9 10*3/uL — ABNORMAL LOW (ref 4.0–10.5)
nRBC: 0 % (ref 0.0–0.2)

## 2020-08-30 LAB — COMPREHENSIVE METABOLIC PANEL
ALT: 18 U/L (ref 0–44)
AST: 21 U/L (ref 15–41)
Albumin: 3.5 g/dL (ref 3.5–5.0)
Alkaline Phosphatase: 55 U/L (ref 38–126)
Anion gap: 11 (ref 5–15)
BUN: 19 mg/dL (ref 8–23)
CO2: 24 mmol/L (ref 22–32)
Calcium: 8.8 mg/dL — ABNORMAL LOW (ref 8.9–10.3)
Chloride: 105 mmol/L (ref 98–111)
Creatinine, Ser: 0.92 mg/dL (ref 0.44–1.00)
GFR, Estimated: >60 mL/min (ref >60)
Glucose, Bld: 85 mg/dL (ref 70–99)
Potassium: 3.6 mmol/L (ref 3.5–5.1)
Sodium: 140 mmol/L (ref 135–145)
Total Bilirubin: 0.4 mg/dL (ref 0.3–1.2)
Total Protein: 6.8 g/dL (ref 6.5–8.1)

## 2020-08-30 LAB — LIPASE, BLOOD: Lipase: 35 U/L (ref 11–51)

## 2020-08-30 MED ORDER — CEPHALEXIN 500 MG PO CAPS: 500.0000 mg | ORAL_CAPSULE | Freq: Four times a day (QID) | ORAL | 0 refills | Status: DC

## 2020-08-30 MED ORDER — CEPHALEXIN 250 MG PO CAPS
500.0000 mg | ORAL_CAPSULE | Freq: Once | ORAL | Status: AC
  Administered 2020-08-30: 19:00:00 500 mg via ORAL
  Filled 2020-08-30: qty 2

## 2020-08-30 NOTE — ED Triage Notes (Signed)
Patient arrived with EMS from home reports generalized abdominal pain with polyuria onset this week , no emesis or diarrhea , denies fever or chills .

## 2020-08-30 NOTE — Discharge Instructions (Addendum)
You were seen in the emergency department for increased urination and lower abdominal discomfort  Urinalysis confirms that you have a urine infection  Take cephalexin 500 mg every 6 hours until completed.  Your symptoms should improve in the next 24 to 48 hours.  Return to the emergency department if you have worsening symptoms, fever, nausea, vomiting, worsening lower abdominal or back/flank pain, sudden changes in mental status or confusion

## 2020-08-30 NOTE — ED Provider Notes (Addendum)
[facility] Provider Note   CSN: [id number] Arrival date & time: 08/30/20  8250     History Chief Complaint  Patient presents with  . Abdominal Pain    Donna Simmons is a 79 y.o. female with past medical history of dementia, paroxysmal atrial fibrillation on Eliquis, hypertension presents to the ED for evaluation of increased urination.  She reports lower abdominal "tightness".  Patient tells me this has been going on for "4 weeks".  Level 5 caveat due documented history of dementia.  Patient tells me that she has "that condition that makes your mind slow down".  She is able to tell me her full name, that she is in the hospital, where she lives and her husbands name.  Denies fever, vomiting, back or flank pain, dysuria.  States she feels "fine" otherwise. States she walks 3 miles a day. Has not had any trouble with this lately. No recent falls. She gives me permission to call her husband to obtain more history.   HPI     Past Medical History:  Diagnosis Date  . Allergy   . GERD (gastroesophageal reflux disease)   . Heart murmur    No significant valvular lesion noted on echo.  . Hypercholesteremia   . Hypertension   . Memory loss   . Paroxysmal atrial fibrillation (HCC) 12/27/2008    Patient Active Problem List   Diagnosis Date Noted  . Bilateral lower extremity edema 08/19/2020  . Other forms of angina pectoris (HCC) 07/25/2018  . Long term current use of anticoagulant therapy 11/18/2012  . Hypercholesteremia 09/25/2012  . DDD (degenerative disc disease), cervical 05/19/2012  . Paroxysmal atrial fibrillation (HCC) - CHA2DS2-VASc Score 3, on Eliquis 04/20/2012  . Essential hypertension 04/20/2012    Past Surgical History:  Procedure Laterality Date  . ABDOMINAL HYSTERECTOMY  1980  . BREAST SURGERY    . CT CTA CORONARY W/CA SCORE W/CM &/OR WO/CM  08/2018   Coronary calcium score 24.  Nonobstructive CAD with less than 30% plaque  in proximal LAD.  Normal aortic root. ->  Relook in October 2020 had significant motion artifact but coronary calcium score 26.  Marland Kitchen EYE SURGERY Right 12/29/2016  . EYE SURGERY Left 01/19/2017  . FRACTURE SURGERY    . LEFT HEART CATH AND CORONARY ANGIOGRAPHY  12/16/2010   no evidence of CAD to explain anginal pain w/ positive troponin.  potential etiology is breakthrough AF  . NM MYOVIEW LTD  April 2010   EF 64%, normal pattern of perfusion in all regions, no scintigraphic evidence of inducible ischemia; no significant wall motion abnormalities; EKG negative for ischemia; no significant change from last study; low risk scan  . TRANSTHORACIC ECHOCARDIOGRAM  07/2018   Normal EF 55-60%.  No RWMA. GR 1 DD. Ao Sclerosis - no Stenosis. Mild AI. Trivial MR.      OB History   No obstetric history on file.     Family History  Problem Relation Age of Onset  . Arthritis Mother        OSTEO  . Hypertension Mother   . Dementia Mother   . Heart disease Father   . Hypertension Sister   . Dementia Sister   . Cancer - Lung Brother   . Cancer Maternal Grandmother   . Heart disease Maternal Grandfather   . Cancer - Lung Brother   . Hypertension Brother   . Cancer - Prostate Brother     Social History   Tobacco  Use  . Smoking status: Never Smoker  . Smokeless tobacco: Never Used  Vaping Use  . Vaping Use: Never used  Substance Use Topics  . Alcohol use: No    Alcohol/week: 0.0 standard drinks  . Drug use: No    Home Medications Prior to Admission medications   Medication Sig Start Date End Date Taking? Authorizing Provider  cephALEXin (KEFLEX) 500 MG capsule Take 1 capsule (500 mg total) by mouth 4 (four) times daily. 08/30/20  Yes Liberty Handy, PA-C  acetaminophen (TYLENOL) 325 MG tablet Take 325-650 mg by mouth daily as needed (pain or headaches).     [provider]  amLODipine (NORVASC) 10 MG tablet Take 10 mg by mouth daily.  09/14/17   [provider]  Blood  Pressure Monitoring (BLOOD PRESSURE CUFF) MISC Take your  blood pressure with blood pressure cuff/moniotor- can be automatic or manual - one to three  times a week as needed 04/20/19   Marykay Lex, MD  ELIQUIS 5 MG TABS tablet Take 1 tablet by mouth twice daily 08/10/20   Marykay Lex, MD  ENSURE (ENSURE) Take 237 mLs by mouth daily.    [provider]  flecainide (TAMBOCOR) 50 MG tablet Take 1 tablet by mouth twice daily 07/02/20   Marykay Lex, MD  fluticasone [facility]) 50 MCG/ACT nasal spray USE TWO SPRAY(S) IN EACH NOSTRIL ONCE DAILY Patient taking differently: Place 2 sprays into both nostrils daily as needed for allergies.  01/05/17   [name], MD  furosemide (LASIX) 20 MG tablet Take 1 tablet by mouth every Monday, Wednesday and Friday and/ or as needed for swelling. 08/07/20   Marykay Lex, MD  lisinopril (ZESTRIL) 20 MG tablet Take 1 tablet by mouth once daily 08/14/20   Marykay Lex, MD  memantine [facility]) 5 MG tablet Take 1 tablet daily for one week, then take 1 tablet twice daily for one week, then take 1 tablet in the morning and 2 in the evening for one week, then take 2 tablets twice daily 07/27/19   York Spaniel, MD  metoprolol succinate (TOPROL-XL) 50 MG 24 hr tablet TAKE 1 TABLET BY MOUTH ONCE DAILY TAKE  WITH  OR  IMMEDIATELY  FOLLOWING  A  MEAL Patient taking differently: Take 50 mg by mouth daily.  09/19/19   Marykay Lex, MD  metoprolol tartrate (LOPRESSOR) 50 MG tablet Take 1 tablet only with flecainide as needed for rapid fast heart rate 08/08/19   Marykay Lex, MD  Multiple Vitamins-Minerals (CENTRUM SILVER 50+WOMEN) TABS Take 1 tablet by mouth daily with lunch.    [provider]  omeprazole (PRILOSEC) 40 MG capsule  01/23/20   [provider]  rosuvastatin (CRESTOR) 20 MG tablet Take 1 tablet by mouth once daily 08/08/20   Marykay Lex, MD  sucralfate (CARAFATE) 1 GM/10ML suspension Take 10 mLs (1 g total) by  mouth 4 (four) times daily -  with meals and at bedtime. 01/05/18   [name], MD    Allergies    Antihistamines, chlorpheniramine-type  Review of Systems   Review of Systems  Gastrointestinal: Positive for abdominal pain.  Hematological: Bruises/bleeds easily.  All other systems reviewed and are negative.   Physical Exam Updated Vital Signs BP (!) 144/83 (BP Location: Right Arm)   Pulse 67   Temp 97.7 F (36.5 C)   Resp 20   Ht 5\' 7"  (1.702 m)   Wt 54.4 kg  SpO2 95%   BMI 18.79 kg/m   Physical Exam Vitals and nursing note reviewed.  Constitutional:      Appearance: She is well-developed.     Comments: Non toxic in NAD  HENT:     Head: Normocephalic and atraumatic.     Nose: Nose normal.  Eyes:     Conjunctiva/sclera: Conjunctivae normal.  Cardiovascular:     Rate and Rhythm: Normal rate and regular rhythm.  Pulmonary:     Effort: Pulmonary effort is normal.     Breath sounds: Normal breath sounds.  Abdominal:     General: Bowel sounds are normal.     Palpations: Abdomen is soft.     Tenderness: There is no abdominal tenderness.     Comments: No G/R/R. No suprapubic or CVA tenderness. Negative Murphy's and McBurney's. Active BS to lower quadrants.   Musculoskeletal:        General: Normal range of motion.     Cervical back: Normal range of motion.  Skin:    General: Skin is warm and dry.     Capillary Refill: Capillary refill takes less than 2 seconds.  Neurological:     Mental Status: She is alert.     Comments: Oriented to name, hospital, events. Disoriented to date. Strength 5/5 in upper/lower extremities. No leg or arm drop/dirft. Speech is clear.  Able to sit up without assistance  Psychiatric:        Behavior: Behavior normal.     ED Results / Procedures / Treatments   Labs (all labs ordered are listed, but only abnormal results are displayed) Labs Reviewed  COMPREHENSIVE METABOLIC PANEL - Abnormal; Notable for the following components:       Result Value   Calcium 8.8 (*)    All other components within normal limits  CBC - Abnormal; Notable for the following components:   WBC 3.9 (*)    Hemoglobin 10.8 (*)    HCT 34.3 (*)    MCH 25.7 (*)    All other components within normal limits  URINALYSIS, ROUTINE W REFLEX MICROSCOPIC - Abnormal; Notable for the following components:   APPearance HAZY (*)    Hgb urine dipstick LARGE (*)    Leukocytes,Ua LARGE (*)    WBC, UA >50 (*)    All other components within normal limits  URINE CULTURE  LIPASE, BLOOD    EKG EKG Interpretation  Date/Time:  Thursday August 30 2020 02:54:59 EST Ventricular Rate:  77 PR Interval:  170 QRS Duration: 80 QT Interval:  378 QTC Calculation: 427 R Axis:   1 Text Interpretation: Sinus rhythm with occasional Premature ventricular complexes Possible Anterior infarct , age undetermined Abnormal ECG No STEMI Confirmed by Alvester Chou [phone]) on 08/30/2020 5:38:36 PM   Radiology No results found.  Procedures Procedures (including critical care time)  Medications Ordered in ED Medications  cephALEXin (KEFLEX) capsule 500 mg (has no administration in time range)    ED Course  I have reviewed the triage vital signs and the nursing notes.  Pertinent labs & imaging results that were available during my care of the patient were reviewed by me and considered in my medical decision making (see chart for details).  Clinical Course as of 08/30/20 1856  Thu Aug 30, 2020  1808 79 yo female w/ mild dementia presenting to ED with suprapubic discomfort, "pulling" sensation in lower abdomen.  Unfortunately had 15 hour wait in waiting room overnight.  Labs today notable for WBC 3.9, UA with +leuks, >  50 WBC, 0-5 squamous, suspect this is a UTI.  Vitals otherwise stable and she is extremely well appearing on exam.  Okay to treat with PO antibiotics.  Husband called for discharge. [MT]    Clinical Course User Index [MT] Trifan, Kermit BaloMatthew J, MD   MDM  Rules/Calculators/A&P                           79 y.o. yo with chief complaint of increased urination and lower abdominal "tightness". H/o dementia.    Previous medical records available, triage and nursing notes reviewed to obtain more history and assist with MDM  Additional information obtained from husband.  He corroborates patient's history and states that for the last couple of days she has complained of increased urination and lower abdominal discomfort.  He denies any other medical concerns and states patient has been at her baseline in terms of mental status.  Denies fever, vomiting. No other concerns.   Differential diagnosis: Cystitis. She denies symptoms to suggest higher infection like pyelonephritis, systemic illness. Considered ureteral stone less likely.   ER lab work and imaging ordered by triage RN and me, as above  I have personally visualized and interpreted ER diagnostic work up including labs and imaging.    Labs reveal - large hemoglobin, 21-50 RBCs, >50 WBC, large leukocytes.    Medications ordered - PO keflex. She is tolerating PO, well appearing without systemic infection.   Re-evaluated the patient.   No clinical decline, tolerating PO antibiotics.   Discussed lab work and urinalysis with husband consistent with uncomplicated UTI. They deny systemic symptoms, acute mental status changes, vomiting.  Don't think further emergent lab work, imaging, admission is necessary today. Will send urine for culture. Husband is able to pick her up, confirmed right pharmacy. Discussed return precautions and follow up with PCP in 48 hours if symptoms not improving. Patient and husband comfortable with plan. Shared with EDP.   Final Clinical Impression(s) / ED Diagnoses Final diagnoses:  Acute cystitis with hematuria    Rx / DC Orders ED Discharge Orders         Ordered    cephALEXin (KEFLEX) 500 MG capsule  4 times daily        08/30/20 1840           Jerrell MylarGibbons,  Arushi Partridge J, PA-C 08/30/20 1856    Terald Sleeperrifan, Matthew J, MD 08/30/20 [phone]

## 2020-08-30 NOTE — ED Notes (Signed)
Pt becoming more altered in evening hours. Pt getting upset.

## 2020-09-01 LAB — URINE CULTURE

## 2020-09-03 ENCOUNTER — Telehealth: Payer: Self-pay | Admitting: *Deleted

## 2020-09-03 NOTE — Telephone Encounter (Signed)
Spoke with husband he stated Donna Simmons is doing much better.

## 2020-09-20 DIAGNOSIS — N3941 Urge incontinence: Secondary | ICD-10-CM | POA: Diagnosis not present

## 2020-09-20 DIAGNOSIS — N3281 Overactive bladder: Secondary | ICD-10-CM | POA: Diagnosis not present

## 2020-09-22 ENCOUNTER — Encounter (HOSPITAL_COMMUNITY): Payer: Self-pay | Admitting: *Deleted

## 2020-09-22 ENCOUNTER — Other Ambulatory Visit: Payer: Self-pay

## 2020-09-22 ENCOUNTER — Emergency Department (HOSPITAL_COMMUNITY)
Admission: EM | Admit: 2020-09-22 | Discharge: 2020-09-22 | Disposition: A | Discharge status: Home / Self Care | Payer: Medicare PPO | Attending: Emergency Medicine | Admitting: Emergency Medicine

## 2020-09-22 DIAGNOSIS — N3001 Acute cystitis with hematuria: Secondary | ICD-10-CM | POA: Diagnosis not present

## 2020-09-22 DIAGNOSIS — F0390 Unspecified dementia without behavioral disturbance: Secondary | ICD-10-CM | POA: Diagnosis not present

## 2020-09-22 DIAGNOSIS — N390 Urinary tract infection, site not specified: Secondary | ICD-10-CM | POA: Diagnosis present

## 2020-09-22 DIAGNOSIS — Z79899 Other long term (current) drug therapy: Secondary | ICD-10-CM | POA: Insufficient documentation

## 2020-09-22 DIAGNOSIS — Z7901 Long term (current) use of anticoagulants: Secondary | ICD-10-CM | POA: Insufficient documentation

## 2020-09-22 DIAGNOSIS — R1084 Generalized abdominal pain: Secondary | ICD-10-CM | POA: Diagnosis not present

## 2020-09-22 DIAGNOSIS — I10 Essential (primary) hypertension: Secondary | ICD-10-CM | POA: Insufficient documentation

## 2020-09-22 DIAGNOSIS — R300 Dysuria: Secondary | ICD-10-CM | POA: Diagnosis not present

## 2020-09-22 LAB — URINALYSIS, ROUTINE W REFLEX MICROSCOPIC
Bacteria, UA: NONE SEEN
Bilirubin Urine: NEGATIVE
Glucose, UA: NEGATIVE mg/dL
Ketones, ur: 5 mg/dL — AB
Nitrite: NEGATIVE
Protein, ur: 100 mg/dL — AB
RBC / HPF: >50 RBC/hpf — ABNORMAL HIGH (ref 0–5)
Specific Gravity, Urine: 1.032 — ABNORMAL HIGH (ref 1.005–1.030)
pH: 6 (ref 5.0–8.0)

## 2020-09-22 LAB — CBC WITH DIFFERENTIAL/PLATELET
Abs Immature Granulocytes: 0.01 10*3/uL (ref 0.00–0.07)
Basophils Absolute: 0 10*3/uL (ref 0.0–0.1)
Basophils Relative: 1 %
Eosinophils Absolute: 0 10*3/uL (ref 0.0–0.5)
Eosinophils Relative: 0 %
HCT: 40.1 % (ref 36.0–46.0)
Hemoglobin: 12.8 g/dL (ref 12.0–15.0)
Immature Granulocytes: 0 %
Lymphocytes Relative: 10 %
Lymphs Abs: 0.6 10*3/uL — ABNORMAL LOW (ref 0.7–4.0)
MCH: 25.8 pg — ABNORMAL LOW (ref 26.0–34.0)
MCHC: 31.9 g/dL (ref 30.0–36.0)
MCV: 80.8 fL (ref 80.0–100.0)
Monocytes Absolute: 0.5 10*3/uL (ref 0.1–1.0)
Monocytes Relative: 8 %
Neutro Abs: 5.1 10*3/uL (ref 1.7–7.7)
Neutrophils Relative %: 81 %
Platelets: 204 10*3/uL (ref 150–400)
RBC: 4.96 MIL/uL (ref 3.87–5.11)
RDW: 14.9 % (ref 11.5–15.5)
WBC: 6.3 10*3/uL (ref 4.0–10.5)
nRBC: 0 % (ref 0.0–0.2)

## 2020-09-22 LAB — COMPREHENSIVE METABOLIC PANEL
ALT: 16 U/L (ref 0–44)
AST: 27 U/L (ref 15–41)
Albumin: 3.8 g/dL (ref 3.5–5.0)
Alkaline Phosphatase: 61 U/L (ref 38–126)
Anion gap: 13 (ref 5–15)
BUN: 10 mg/dL (ref 8–23)
CO2: 26 mmol/L (ref 22–32)
Calcium: 9.4 mg/dL (ref 8.9–10.3)
Chloride: 102 mmol/L (ref 98–111)
Creatinine, Ser: 0.92 mg/dL (ref 0.44–1.00)
GFR, Estimated: >60 mL/min (ref >60)
Glucose, Bld: 104 mg/dL — ABNORMAL HIGH (ref 70–99)
Potassium: 3.6 mmol/L (ref 3.5–5.1)
Sodium: 141 mmol/L (ref 135–145)
Total Bilirubin: 0.8 mg/dL (ref 0.3–1.2)
Total Protein: 7.4 g/dL (ref 6.5–8.1)

## 2020-09-22 MED ORDER — SODIUM CHLORIDE 0.9 % IV SOLN
1.0000 g | Freq: Once | INTRAVENOUS | Status: AC
  Administered 2020-09-22: 1 g via INTRAVENOUS
  Filled 2020-09-22: qty 10

## 2020-09-22 NOTE — Discharge Instructions (Addendum)
Please continue taking the antibiotic prescribed to you by [facility].  Your laboratory results were within normal limits today.  If you experience a fever, back pain, worsening symptoms please return to the emergency department.

## 2020-09-22 NOTE — ED Provider Notes (Signed)
[facility] EMERGENCY DEPARTMENT Provider Note   CSN: [id number] Arrival date & time: 09/22/20  1751     History Chief Complaint  Patient presents with  . Urinary Tract Infection    Donna Simmons is a 80 y.o. female.  80 y.o female with a PMH of HTN, PAF on eliquis presents to the ED via EMS with a chief complaint of UTI. According to Ems reports she was started on antibiotics yesterday for a UTI. Level 5 Caveat due to Dementia.   The history is provided by the spouse, the patient and the EMS personnel.       Past Medical History:  Diagnosis Date  . Allergy   . GERD (gastroesophageal reflux disease)   . Heart murmur    No significant valvular lesion noted on echo.  . Hypercholesteremia   . Hypertension   . Memory loss   . Paroxysmal atrial fibrillation (HCC) 12/27/2008    Patient Active Problem List   Diagnosis Date Noted  . Bilateral lower extremity edema 08/19/2020  . Other forms of angina pectoris (HCC) 07/25/2018  . Long term current use of anticoagulant therapy 11/18/2012  . Hypercholesteremia 09/25/2012  . DDD (degenerative disc disease), cervical 05/19/2012  . Paroxysmal atrial fibrillation (HCC) - CHA2DS2-VASc Score 3, on Eliquis 04/20/2012  . Essential hypertension 04/20/2012    Past Surgical History:  Procedure Laterality Date  . ABDOMINAL HYSTERECTOMY  1980  . BREAST SURGERY    . CT CTA CORONARY W/CA SCORE W/CM &/OR WO/CM  08/2018   Coronary calcium score 24.  Nonobstructive CAD with less than 30% plaque in proximal LAD.  Normal aortic root. ->  Relook in October 2020 had significant motion artifact but coronary calcium score 26.  Marland Kitchen EYE SURGERY Right 12/29/2016  . EYE SURGERY Left 01/19/2017  . FRACTURE SURGERY    . LEFT HEART CATH AND CORONARY ANGIOGRAPHY  12/16/2010   no evidence of CAD to explain anginal pain w/ positive troponin.  potential etiology is breakthrough AF  . NM MYOVIEW LTD  April 2010   EF 64%, normal pattern of  perfusion in all regions, no scintigraphic evidence of inducible ischemia; no significant wall motion abnormalities; EKG negative for ischemia; no significant change from last study; low risk scan  . TRANSTHORACIC ECHOCARDIOGRAM  07/2018   Normal EF 55-60%.  No RWMA. GR 1 DD. Ao Sclerosis - no Stenosis. Mild AI. Trivial MR.      OB History   No obstetric history on file.     Family History  Problem Relation Age of Onset  . Arthritis Mother        OSTEO  . Hypertension Mother   . Dementia Mother   . Heart disease Father   . Hypertension Sister   . Dementia Sister   . Cancer - Lung Brother   . Cancer Maternal Grandmother   . Heart disease Maternal Grandfather   . Cancer - Lung Brother   . Hypertension Brother   . Cancer - Prostate Brother     Social History   Tobacco Use  . Smoking status: Never Smoker  . Smokeless tobacco: Never Used  Vaping Use  . Vaping Use: Never used  Substance Use Topics  . Alcohol use: No    Alcohol/week: 0.0 standard drinks  . Drug use: No    Home Medications Prior to Admission medications   Medication Sig Start Date End Date Taking? Authorizing Provider  acetaminophen (TYLENOL) 325 MG tablet Take 325-650 mg by  mouth daily as needed (pain or headaches).     [provider]  amLODipine (NORVASC) 10 MG tablet Take 10 mg by mouth daily.  09/14/17   [provider]  Blood Pressure Monitoring (BLOOD PRESSURE CUFF) MISC Take your  blood pressure with blood pressure cuff/moniotor- can be automatic or manual - one to three  times a week as needed 04/20/19   Marykay Lex, MD  cephALEXin (KEFLEX) 500 MG capsule Take 1 capsule (500 mg total) by mouth 4 (four) times daily. 08/30/20   Liberty Handy, PA-C  ELIQUIS 5 MG TABS tablet Take 1 tablet by mouth twice daily 08/10/20   Marykay Lex, MD  ENSURE (ENSURE) Take 237 mLs by mouth daily.    [provider]  flecainide (TAMBOCOR) 50 MG tablet Take 1 tablet by mouth twice  daily 07/02/20   Marykay Lex, MD  fluticasone [facility]) 50 MCG/ACT nasal spray USE TWO SPRAY(S) IN EACH NOSTRIL ONCE DAILY Patient taking differently: Place 2 sprays into both nostrils daily as needed for allergies.  01/05/17   [name], MD  furosemide (LASIX) 20 MG tablet Take 1 tablet by mouth every Monday, Wednesday and Friday and/ or as needed for swelling. 08/07/20   Marykay Lex, MD  lisinopril (ZESTRIL) 20 MG tablet Take 1 tablet by mouth once daily 08/14/20   Marykay Lex, MD  memantine [facility]) 5 MG tablet Take 1 tablet daily for one week, then take 1 tablet twice daily for one week, then take 1 tablet in the morning and 2 in the evening for one week, then take 2 tablets twice daily 07/27/19   York Spaniel, MD  metoprolol succinate (TOPROL-XL) 50 MG 24 hr tablet TAKE 1 TABLET BY MOUTH ONCE DAILY TAKE  WITH  OR  IMMEDIATELY  FOLLOWING  A  MEAL Patient taking differently: Take 50 mg by mouth daily.  09/19/19   Marykay Lex, MD  metoprolol tartrate (LOPRESSOR) 50 MG tablet Take 1 tablet only with flecainide as needed for rapid fast heart rate 08/08/19   Marykay Lex, MD  Multiple Vitamins-Minerals (CENTRUM SILVER 50+WOMEN) TABS Take 1 tablet by mouth daily with lunch.    [provider]  omeprazole (PRILOSEC) 40 MG capsule  01/23/20   [provider]  rosuvastatin (CRESTOR) 20 MG tablet Take 1 tablet by mouth once daily 08/08/20   Marykay Lex, MD  sucralfate (CARAFATE) 1 GM/10ML suspension Take 10 mLs (1 g total) by mouth 4 (four) times daily -  with meals and at bedtime. 01/05/18   [name], MD    Allergies    Antihistamines, chlorpheniramine-type  Review of Systems   Review of Systems  Unable to perform ROS: Dementia    Physical Exam Updated Vital Signs BP (!) 123/104   Pulse 84   Temp 99.2 F (37.3 C) (Rectal)   Resp 17   Ht 5\' 7"  (1.702 m)   Wt 54.4 kg   SpO2 100%   BMI 18.78 kg/m   Physical Exam Vitals and  nursing note reviewed.  Constitutional:      Appearance: Normal appearance.  HENT:     Head: Atraumatic.     Mouth/Throat:     Mouth: Mucous membranes are moist.  Eyes:     Pupils: Pupils are equal, round, and reactive to light.  Pulmonary:     Effort: Pulmonary effort is normal.  Abdominal:     General: Abdomen is flat.  Palpations: Abdomen is soft.     Tenderness: There is no abdominal tenderness. There is no right CVA tenderness or left CVA tenderness.  Musculoskeletal:     Cervical back: Normal range of motion and neck supple.  Skin:    General: Skin is warm and dry.  Neurological:     Mental Status: She is alert. Mental status is at baseline.     ED Results / Procedures / Treatments   Labs (all labs ordered are listed, but only abnormal results are displayed) Labs Reviewed  CBC WITH DIFFERENTIAL/PLATELET - Abnormal; Notable for the following components:      Result Value   MCH 25.8 (*)    Lymphs Abs 0.6 (*)    All other components within normal limits  COMPREHENSIVE METABOLIC PANEL - Abnormal; Notable for the following components:   Glucose, Bld 104 (*)    All other components within normal limits  URINALYSIS, ROUTINE W REFLEX MICROSCOPIC - Abnormal; Notable for the following components:   APPearance HAZY (*)    Specific Gravity, Urine 1.032 (*)    Hgb urine dipstick MODERATE (*)    Ketones, ur 5 (*)    Protein, ur 100 (*)    Leukocytes,Ua SMALL (*)    RBC / HPF >50 (*)    All other components within normal limits  URINE CULTURE    EKG None  Radiology No results found.  Procedures Procedures (including critical care time)  Medications Ordered in ED Medications  cefTRIAXone (ROCEPHIN) 1 g in sodium chloride 0.9 % 100 mL IVPB (1 g Intravenous New Bag/Given 09/22/20 0857)    ED Course  I have reviewed the triage vital signs and the nursing notes.  Pertinent labs & imaging results that were available during my care of the patient were reviewed by me  and considered in my medical decision making (see chart for details).  Clinical Course as of 09/22/20 1016  Sat Sep 22, 2020  0934 Hgb urine dipstickMarland Kitchen(!): MODERATE [JS]  0934 Glori LuisLeukocytes,Ua(!): SMALL [JS]    Clinical Course User Index [JS] Claude MangesSoto, Letricia Krinsky, PA-C   MDM Rules/Calculators/A&P   Patient brought in via EMS for possible UTI.  According to EMS report patient was started on antibiotics for UTI yesterday, EMS was called out by husband.  Level five caveat due to patient altered mental status.  Will place call for husband in order to obtain collateral information.  6:48 AM Spoke to husband Mr.Thurmond who reports patient was urinating frequently and complaining that she was hurting  "pointing below the waist", "I couldn't make up what she was pointing at so I figure a good idea for someone else to take a look at her".Patient began antibiotics for UTI yesterday, currently on cephalexin 500 mg BID. Given by [facility] Gaynelle CageJanneya Davis NP who examined them two days ago. Patient has not had any fever, no nausea, no vomiting.   Patient is overall nontoxic, non-ill-appearing, oral temperature was 98.5, a rectal temperature was obtained at 99.2.  Her abdomen is soft, nontender to palpation, no pain along chest wall region, lungs are clear to auscultation, no bilateral leg swelling.  According to husband she was having urinary frequency, attempting to obtain urine specimen.  She was given 1 g of Rocephin while in the ED awaiting UA.  She is currently on day two of antibiotic therapy.  Screening labs obtained, no leukocytosis on CBC, hemoglobin is normal.  CMP without any electrolyte abnormality, her creatinine level is within normal limits at 0.9.  LFTs are unremarkable.  UA showed small leukocytes, a clean specimen was obtained for culture.  On reassessment, patient is at baseline, not reporting any pain at this time.  No fever, no CVA tenderness, I have a low suspicion for Pilo at this time.  I do  not believe he has failed outpatient therapy as she is only completed day 1 of antibiotics.  She was given a shot of Rocephin while in the ED to help cover her for her UTI.  I have personally called Mr.Thurmond, husband who I discussed results with, along with plan.  He is to pick up patient from the ED.  Patient is otherwise in stable condition, return precautions discussed at length.  I have discussed this patient with Dr. Senaida Ores, who agrees with plan and management.   Portions of this note were generated with Scientist, clinical (histocompatibility and immunogenetics). Dictation errors may occur despite best attempts at proofreading.  Final Clinical Impression(s) / ED Diagnoses Final diagnoses:  Acute cystitis with hematuria    Rx / DC Orders ED Discharge Orders    None       Claude Manges, PA-C 09/22/20 1016    Pollina, Canary Brim, MD 09/23/20 [phone]

## 2020-09-22 NOTE — ED Triage Notes (Signed)
Pt arrived with EMS for painful urination. Started antibiotics yesterday for UTI.

## 2020-09-23 LAB — URINE CULTURE: Culture: NO GROWTH

## 2020-09-26 ENCOUNTER — Other Ambulatory Visit: Payer: Self-pay | Admitting: Cardiology

## 2020-09-27 ENCOUNTER — Ambulatory Visit: Payer: Medicare PPO | Admitting: Sports Medicine

## 2020-09-27 ENCOUNTER — Encounter: Payer: Self-pay | Admitting: Sports Medicine

## 2020-09-27 ENCOUNTER — Other Ambulatory Visit: Payer: Self-pay

## 2020-09-27 DIAGNOSIS — L853 Xerosis cutis: Secondary | ICD-10-CM | POA: Diagnosis not present

## 2020-09-27 DIAGNOSIS — I890 Lymphedema, not elsewhere classified: Secondary | ICD-10-CM | POA: Diagnosis not present

## 2020-09-27 DIAGNOSIS — R413 Other amnesia: Secondary | ICD-10-CM

## 2020-09-27 DIAGNOSIS — I739 Peripheral vascular disease, unspecified: Secondary | ICD-10-CM | POA: Diagnosis not present

## 2020-09-27 NOTE — Progress Notes (Signed)
Subjective: Donna Simmons is a 80 y.o. female patient seen today in office for follow up on ankle pain and swelling. Patient denies any pain at this time and reports that her swelling is getting better and her skin is dry. No other issues noted.  Patient Active Problem List   Diagnosis Date Noted  . Bilateral lower extremity edema 08/19/2020  . Other forms of angina pectoris (HCC) 07/25/2018  . Long term current use of anticoagulant therapy 11/18/2012  . Hypercholesteremia 09/25/2012  . DDD (degenerative disc disease), cervical 05/19/2012  . Paroxysmal atrial fibrillation (HCC) - CHA2DS2-VASc Score 3, on Eliquis 04/20/2012  . Essential hypertension 04/20/2012    Current Outpatient Medications on File Prior to Visit  Medication Sig Dispense Refill  . acetaminophen (TYLENOL) 325 MG tablet Take 325-650 mg by mouth daily as needed (pain or headaches).     Marland Kitchen amLODipine (NORVASC) 10 MG tablet Take 10 mg by mouth daily.     . Blood Pressure Monitoring (BLOOD PRESSURE CUFF) MISC Take your  blood pressure with blood pressure cuff/moniotor- can be automatic or manual - one to three  times a week as needed 1 each 0  . cephALEXin (KEFLEX) 500 MG capsule Take 1 capsule (500 mg total) by mouth 4 (four) times daily. 20 capsule 0  . ELIQUIS 5 MG TABS tablet Take 1 tablet by mouth twice daily 180 tablet 1  . ENSURE (ENSURE) Take 237 mLs by mouth daily.    . flecainide (TAMBOCOR) 50 MG tablet Take 1 tablet by mouth twice daily 180 tablet 2  . fluticasone (FLONASE) 50 MCG/ACT nasal spray USE TWO SPRAY(S) IN EACH NOSTRIL ONCE DAILY (Patient taking differently: Place 2 sprays into both nostrils daily as needed for allergies. ) 16 g 1  . furosemide (LASIX) 20 MG tablet Take 1 tablet by mouth every Monday, Wednesday and Friday and/ or as needed for swelling. 90 tablet 3  . lisinopril (ZESTRIL) 20 MG tablet Take 1 tablet by mouth once daily 90 tablet 3  . memantine (NAMENDA) 5 MG tablet Take 1 tablet daily  for one week, then take 1 tablet twice daily for one week, then take 1 tablet in the morning and 2 in the evening for one week, then take 2 tablets twice daily 70 tablet 0  . metoprolol succinate (TOPROL-XL) 50 MG 24 hr tablet TAKE 1 TABLET BY MOUTH ONCE DAILY WITH OR IMMEDIATELY FOLLOWING A MEAL 90 tablet 0  . metoprolol tartrate (LOPRESSOR) 50 MG tablet Take 1 tablet only with flecainide as needed for rapid fast heart rate 10 tablet 6  . Multiple Vitamins-Minerals (CENTRUM SILVER 50+WOMEN) TABS Take 1 tablet by mouth daily with lunch.    Marland Kitchen MYRBETRIQ 25 MG TB24 tablet Take 25 mg by mouth daily.    Marland Kitchen omeprazole (PRILOSEC) 40 MG capsule     . rosuvastatin (CRESTOR) 20 MG tablet Take 1 tablet by mouth once daily 90 tablet 3  . sucralfate (CARAFATE) 1 GM/10ML suspension Take 10 mLs (1 g total) by mouth 4 (four) times daily -  with meals and at bedtime. 420 mL 0   No current facility-administered medications on file prior to visit.    Allergies  Allergen Reactions  . Antihistamines, Chlorpheniramine-Type Palpitations    Makes heart beat fast    Objective: Physical Exam  General: Well developed, nourished, no acute distress, awake, alert and oriented x 2 history of memory problems  Vascular: Dorsalis pedis artery 0/4 bilateral, Posterior tibial artery 0/4 bilateral, skin  temperature warm to warm proximal to distal bilateral lower extremities, there is trophic skin changes consistent with lymphedema noted bilateral.  Neurological: Gross sensation present via light touch bilateral.   Dermatological: Skin is warm, dry, and supple bilateral, Nails 1-10 are short thick, and discolored with mild subungal debris, no webspace macerations present bilateral, no open lesions present bilateral, no callus/corns/hyperkeratotic tissue present bilateral.  Old surgical scars noted to left ankle.  No signs of infection bilateral.  Musculoskeletal: Asymptomatic pes planus boney deformities noted bilateral.  Muscular strength within normal limits without pain on range of motion. No pain with calf compression bilateral.No pain to ankls.   Assessment and Plan:  Problem List Items Addressed This Visit   None   Visit Diagnoses    Lymphedema    -  Primary   PVD (peripheral vascular disease) (HCC)       Memory difficulties       Dry skin          -Examined patient.  -Dispensed foot miracle for dry skin. -Continue with Surgigrip compression sleeve for patient to wear as instructed for edema control advised again elevation limit sodium intake and to also discuss swelling in her legs with her PCP for possible medication management if fails to improve -Patient to return as needed or sooner if issues arise. Asencion Islam, DPM

## 2020-10-02 DIAGNOSIS — N3281 Overactive bladder: Secondary | ICD-10-CM | POA: Diagnosis not present

## 2020-10-22 ENCOUNTER — Encounter (HOSPITAL_COMMUNITY): Payer: Self-pay

## 2020-10-22 ENCOUNTER — Emergency Department (HOSPITAL_COMMUNITY)
Admission: EM | Admit: 2020-10-22 | Discharge: 2020-10-22 | Disposition: A | Discharge status: Home / Self Care | Payer: Medicare PPO | Attending: Emergency Medicine | Admitting: Emergency Medicine

## 2020-10-22 ENCOUNTER — Other Ambulatory Visit: Payer: Self-pay

## 2020-10-22 DIAGNOSIS — Z7901 Long term (current) use of anticoagulants: Secondary | ICD-10-CM | POA: Diagnosis not present

## 2020-10-22 DIAGNOSIS — R404 Transient alteration of awareness: Secondary | ICD-10-CM | POA: Diagnosis not present

## 2020-10-22 DIAGNOSIS — F0390 Unspecified dementia without behavioral disturbance: Secondary | ICD-10-CM | POA: Diagnosis not present

## 2020-10-22 DIAGNOSIS — R350 Frequency of micturition: Secondary | ICD-10-CM

## 2020-10-22 DIAGNOSIS — R1030 Lower abdominal pain, unspecified: Secondary | ICD-10-CM | POA: Diagnosis not present

## 2020-10-22 DIAGNOSIS — Z79899 Other long term (current) drug therapy: Secondary | ICD-10-CM | POA: Insufficient documentation

## 2020-10-22 DIAGNOSIS — I10 Essential (primary) hypertension: Secondary | ICD-10-CM | POA: Diagnosis not present

## 2020-10-22 DIAGNOSIS — I480 Paroxysmal atrial fibrillation: Secondary | ICD-10-CM | POA: Insufficient documentation

## 2020-10-22 DIAGNOSIS — R109 Unspecified abdominal pain: Secondary | ICD-10-CM

## 2020-10-22 DIAGNOSIS — R1084 Generalized abdominal pain: Secondary | ICD-10-CM | POA: Diagnosis not present

## 2020-10-22 DIAGNOSIS — R300 Dysuria: Secondary | ICD-10-CM | POA: Diagnosis not present

## 2020-10-22 LAB — COMPREHENSIVE METABOLIC PANEL
ALT: 22 U/L (ref 0–44)
AST: 22 U/L (ref 15–41)
Albumin: 3.9 g/dL (ref 3.5–5.0)
Alkaline Phosphatase: 61 U/L (ref 38–126)
Anion gap: 11 (ref 5–15)
BUN: 22 mg/dL (ref 8–23)
CO2: 25 mmol/L (ref 22–32)
Calcium: 9.1 mg/dL (ref 8.9–10.3)
Chloride: 105 mmol/L (ref 98–111)
Creatinine, Ser: 0.89 mg/dL (ref 0.44–1.00)
GFR, Estimated: >60 mL/min (ref >60)
Glucose, Bld: 82 mg/dL (ref 70–99)
Potassium: 4.5 mmol/L (ref 3.5–5.1)
Sodium: 141 mmol/L (ref 135–145)
Total Bilirubin: 0.7 mg/dL (ref 0.3–1.2)
Total Protein: 7.5 g/dL (ref 6.5–8.1)

## 2020-10-22 LAB — CBC
HCT: 37.1 % (ref 36.0–46.0)
Hemoglobin: 11.6 g/dL — ABNORMAL LOW (ref 12.0–15.0)
MCH: 25.4 pg — ABNORMAL LOW (ref 26.0–34.0)
MCHC: 31.3 g/dL (ref 30.0–36.0)
MCV: 81.2 fL (ref 80.0–100.0)
Platelets: 183 10*3/uL (ref 150–400)
RBC: 4.57 MIL/uL (ref 3.87–5.11)
RDW: 15.9 % — ABNORMAL HIGH (ref 11.5–15.5)
WBC: 4.9 10*3/uL (ref 4.0–10.5)
nRBC: 0 % (ref 0.0–0.2)

## 2020-10-22 LAB — URINALYSIS, ROUTINE W REFLEX MICROSCOPIC
Bilirubin Urine: NEGATIVE
Glucose, UA: NEGATIVE mg/dL
Ketones, ur: NEGATIVE mg/dL
Leukocytes,Ua: NEGATIVE
Nitrite: NEGATIVE
Protein, ur: NEGATIVE mg/dL
Specific Gravity, Urine: 1.021 (ref 1.005–1.030)
pH: 6 (ref 5.0–8.0)

## 2020-10-22 LAB — LIPASE, BLOOD: Lipase: 39 U/L (ref 11–51)

## 2020-10-22 NOTE — ED Provider Notes (Signed)
[facility] DEPT Provider Note   CSN: [id number] Arrival date & time: 10/22/20  1618     History Chief Complaint  Patient presents with  . Abdominal Pain    Donna Simmons is a 80 y.o. female with history of paroxysmal atrial fibrillation (on Eliquis), hypertension, dementia.  Is alert to person and place.  Patient reports that she has had increased urinary frequency dysuria for the past month.  Patient denies any other symptoms.  Spoke to her husband reports that patient has been planing of increased urination, dysuria and abdominal pain.  Reports that pain has been located in her lower abdomen.  Denies patient has had any fevers, vomiting, or diarrhea.  Reports patient is at her baseline mental status.  HPI     Past Medical History:  Diagnosis Date  . Allergy   . GERD (gastroesophageal reflux disease)   . Heart murmur    No significant valvular lesion noted on echo.  . Hypercholesteremia   . Hypertension   . Memory loss   . Paroxysmal atrial fibrillation ([place]) 12/27/2008    Patient Active Problem List   Diagnosis Date Noted  . Bilateral lower extremity edema 08/19/2020  . Other forms of angina pectoris ([place]) 07/25/2018  . Long term current use of anticoagulant therapy 11/18/2012  . Hypercholesteremia 09/25/2012  . DDD (degenerative disc disease), cervical 05/19/2012  . Paroxysmal atrial fibrillation (HCC) - CHA2DS2-VASc Score 3, on Eliquis 04/20/2012  . Essential hypertension 04/20/2012    Past Surgical History:  Procedure Laterality Date  . ABDOMINAL HYSTERECTOMY  1980  . BREAST SURGERY    . CT CTA CORONARY W/CA SCORE W/CM &/OR WO/CM  08/2018   Coronary calcium score 24.  Nonobstructive CAD with less than 30% plaque in proximal LAD.  Normal aortic root. ->  Relook in October 2020 had significant motion artifact but coronary calcium score 26.  Marland Kitchen EYE SURGERY Right 12/29/2016  . EYE SURGERY Left 01/19/2017  . FRACTURE SURGERY    .  LEFT HEART CATH AND CORONARY ANGIOGRAPHY  12/16/2010   no evidence of CAD to explain anginal pain w/ positive troponin.  potential etiology is breakthrough AF  . NM MYOVIEW LTD  April 2010   EF 64%, normal pattern of perfusion in all regions, no scintigraphic evidence of inducible ischemia; no significant wall motion abnormalities; EKG negative for ischemia; no significant change from last study; low risk scan  . TRANSTHORACIC ECHOCARDIOGRAM  07/2018   Normal EF 55-60%.  No RWMA. GR 1 DD. Ao Sclerosis - no Stenosis. Mild AI. Trivial MR.      OB History   No obstetric history on file.     Family History  Problem Relation Age of Onset  . Arthritis Mother        OSTEO  . Hypertension Mother   . Dementia Mother   . Heart disease Father   . Hypertension Sister   . Dementia Sister   . Cancer - Lung Brother   . Cancer Maternal Grandmother   . Heart disease Maternal Grandfather   . Cancer - Lung Brother   . Hypertension Brother   . Cancer - Prostate Brother     Social History   Tobacco Use  . Smoking status: Never Smoker  . Smokeless tobacco: Never Used  Vaping Use  . Vaping Use: Never used  Substance Use Topics  . Alcohol use: No    Alcohol/week: 0.0 standard drinks  . Drug use: No    Home  Medications Prior to Admission medications   Medication Sig Start Date End Date Taking? Authorizing Provider  acetaminophen (TYLENOL) 325 MG tablet Take 325-650 mg by mouth daily as needed (pain or headaches).     [provider]  amLODipine (NORVASC) 10 MG tablet Take 10 mg by mouth daily.  09/14/17   [provider]  Blood Pressure Monitoring (BLOOD PRESSURE CUFF) MISC Take your  blood pressure with blood pressure cuff/moniotor- can be automatic or manual - one to three  times a week as needed 04/20/19   Leonie Man, MD  cephALEXin (KEFLEX) 500 MG capsule Take 1 capsule (500 mg total) by mouth 4 (four) times daily. 08/30/20   Kinnie Feil, PA-C  ELIQUIS 5 MG  TABS tablet Take 1 tablet by mouth twice daily 08/10/20   Leonie Man, MD  ENSURE (ENSURE) Take 237 mLs by mouth daily.    [provider]  flecainide (TAMBOCOR) 50 MG tablet Take 1 tablet by mouth twice daily 07/02/20   Leonie Man, MD  fluticasone [facility]) 50 MCG/ACT nasal spray USE TWO SPRAY(S) IN EACH NOSTRIL ONCE DAILY Patient taking differently: Place 2 sprays into both nostrils daily as needed for allergies.  01/05/17   Wendie Agreste, MD  furosemide (LASIX) 20 MG tablet Take 1 tablet by mouth every Monday, Wednesday and Friday and/ or as needed for swelling. 08/07/20   Leonie Man, MD  lisinopril (ZESTRIL) 20 MG tablet Take 1 tablet by mouth once daily 08/14/20   Leonie Man, MD  memantine [facility]) 5 MG tablet Take 1 tablet daily for one week, then take 1 tablet twice daily for one week, then take 1 tablet in the morning and 2 in the evening for one week, then take 2 tablets twice daily 07/27/19   Kathrynn Ducking, MD  metoprolol succinate (TOPROL-XL) 50 MG 24 hr tablet TAKE 1 TABLET BY MOUTH ONCE DAILY WITH OR IMMEDIATELY FOLLOWING A MEAL 09/26/20   Leonie Man, MD  metoprolol tartrate (LOPRESSOR) 50 MG tablet Take 1 tablet only with flecainide as needed for rapid fast heart rate 08/08/19   Leonie Man, MD  Multiple Vitamins-Minerals (CENTRUM SILVER 50+WOMEN) TABS Take 1 tablet by mouth daily with lunch.    [provider]  MYRBETRIQ 25 MG TB24 tablet Take 25 mg by mouth daily. 09/15/20   [provider]  omeprazole (PRILOSEC) 40 MG capsule  01/23/20   [provider]  rosuvastatin (CRESTOR) 20 MG tablet Take 1 tablet by mouth once daily 08/08/20   Leonie Man, MD  sucralfate (CARAFATE) 1 GM/10ML suspension Take 10 mLs (1 g total) by mouth 4 (four) times daily -  with meals and at bedtime. 01/05/18   Wendie Agreste, MD    Allergies    Antihistamines, chlorpheniramine-type  Review of Systems   Review of Systems  Unable  to perform ROS: Dementia    Physical Exam Updated Vital Signs BP (!) 142/80 (BP Location: Right Arm)   Pulse 60   Temp 98.2 F (36.8 C) (Oral)   Resp 15   SpO2 99%   Physical Exam Vitals and nursing note reviewed.  Constitutional:      General: She is not in acute distress.    Appearance: She is not ill-appearing, toxic-appearing or diaphoretic.  HENT:     Head: Normocephalic.  Eyes:     General: No scleral icterus.       Right eye: No discharge.  Left eye: No discharge.  Cardiovascular:     Rate and Rhythm: Normal rate.     Heart sounds: Normal heart sounds.  Pulmonary:     Effort: Pulmonary effort is normal. No tachypnea, bradypnea or respiratory distress.     Breath sounds: Normal breath sounds. No decreased breath sounds, wheezing, rhonchi or rales.  Abdominal:     General: Abdomen is flat. Bowel sounds are normal. There is no distension. There are no signs of injury.     Palpations: Abdomen is soft. There is no mass or pulsatile mass.     Tenderness: There is no abdominal tenderness. There is no right CVA tenderness, left CVA tenderness, guarding or rebound.     Hernia: There is no hernia in the umbilical area or ventral area.  Musculoskeletal:     Cervical back: Neck supple.     Right lower leg: 1+ Edema present.     Left lower leg: 1+ Edema present.  Feet:     Right foot:     Skin integrity: Dry skin present. No ulcer, blister, skin breakdown, erythema, warmth, callus or fissure.     Toenail Condition: Right toenails are abnormally thick.     Left foot:     Skin integrity: Dry skin present. No ulcer, blister, skin breakdown, erythema, warmth, callus or fissure.     Toenail Condition: Left toenails are abnormally thick.  Skin:    General: Skin is warm and dry.     Coloration: Skin is not jaundiced or pale.  Neurological:     General: No focal deficit present.     Mental Status: She is alert. Mental status is at baseline.     GCS: GCS eye subscore is 4.  GCS verbal subscore is 5. GCS motor subscore is 6.     Comments: Alert to person and place, Per patient's husband is at mental baseline  Psychiatric:        Behavior: Behavior is cooperative.     ED Results / Procedures / Treatments   Labs (all labs ordered are listed, but only abnormal results are displayed) Labs Reviewed  CBC - Abnormal; Notable for the following components:      Result Value   Hemoglobin 11.6 (*)    MCH 25.4 (*)    RDW 15.9 (*)    All other components within normal limits  URINALYSIS, ROUTINE W REFLEX MICROSCOPIC - Abnormal; Notable for the following components:   Hgb urine dipstick MODERATE (*)    Bacteria, UA RARE (*)    All other components within normal limits  URINE CULTURE  LIPASE, BLOOD  COMPREHENSIVE METABOLIC PANEL    EKG None  Radiology No results found.  Procedures Procedures   Medications Ordered in ED Medications - No data to display  ED Course  I have reviewed the triage vital signs and the nursing notes.  Pertinent labs & imaging results that were available during my care of the patient were reviewed by me and considered in my medical decision making (see chart for details).    MDM Rules/Calculators/A&P                          Alert 80 year old female no acute distress, nontoxic appearing.  Patient has a history of dementia is alert to person and place.  Level 5 caveat applies.  Patient is able to report that she is having increased urinary frequency and dysuria x1 month.  Spoke to patient's husband who  reports that patient also endorsed lower abdominal pain over the last few days.  Reports that patient is at her baseline mental status.    Normal active bowel sounds, abdomen soft, nondistended, nontender.  No mass, pulsatile mass, ventral hernia, umbilical hernia.  Lipase within normal limits less concerning for acute pancreatitis.  CMP is unremarkable.  AST, ALT, alk phos, total bili all within normal limits; less concern for  acute hepatobiliary disease.  No leukocytosis noted hemoglobin is slightly decreased at 11.6.  UA shows bacteriuria, leukocytes negative, nitrate negative, WBC 0-5; less concerning for urinary tract infection.  She is noted to have RBC 21-50 in urinalysis.  We will send urine for culture.    Abdomen nonsurgical and labs do not indicate any acute abdominal process.  No indication for further imaging at this time.  UA shows no signs of infection will send for culture and contact patient if positive regarding treatment.  Will discharge patient home.  Patient to follow-up with primary care provider.    Patient was discussed with and evaluated by Dr. Alvino Chapel.    Final Clinical Impression(s) / ED Diagnoses Final diagnoses:  Urinary frequency  Abdominal pain, unspecified abdominal location    Rx / DC Orders ED Discharge Orders    None       Dyann Ruddle 10/22/20 2225    Davonna Belling, MD 10/22/20 [phone]

## 2020-10-22 NOTE — ED Triage Notes (Signed)
Pt arrived via EMS, from home. Pt lives home with husband. Both husband and pt with hx of dementia. C/o abd pain x5 days, painful urination. Alert oriented x2, baseline.

## 2020-10-22 NOTE — Discharge Instructions (Addendum)
You came to the hospital to be evaluated for your urinary frequency, pain with urination, and abdominal pain.  Your lab work showed no urinary tract infection.  Your abdominal exam was unremarkable and your lab work did not show any signs of acute problem with your abdomen. Based on your findings we will discharge you home home and have you follow-up with primary care provider.    Get help right away if: Your pain does not go away as soon as your health care provider told you to expect. You cannot stop vomiting. Your pain is only in areas of the abdomen, such as the right side or the left lower portion of the abdomen. Pain on the right side could be caused by appendicitis. You have bloody or black stools, or stools that look like tar. You have severe pain, cramping, or bloating in your abdomen. You have signs of dehydration, such as: Dark urine, very little urine, or no urine. Cracked lips. Dry mouth. Sunken eyes. Sleepiness. Weakness. You have trouble breathing or chest pain.

## 2020-10-24 ENCOUNTER — Encounter: Payer: Self-pay | Admitting: *Deleted

## 2020-10-24 LAB — URINE CULTURE: Culture: <10000 — AB

## 2020-10-25 ENCOUNTER — Ambulatory Visit: Payer: Medicare PPO | Admitting: Emergency Medicine

## 2020-11-05 ENCOUNTER — Encounter: Payer: Self-pay | Admitting: Emergency Medicine

## 2020-11-05 ENCOUNTER — Other Ambulatory Visit: Payer: Self-pay

## 2020-11-05 ENCOUNTER — Ambulatory Visit: Payer: Medicare PPO | Admitting: Emergency Medicine

## 2020-11-05 VITALS — BP 138/81 | HR 70 | Temp 97.9°F | Ht 63.0 in | Wt 126.4 lb

## 2020-11-05 DIAGNOSIS — R350 Frequency of micturition: Secondary | ICD-10-CM | POA: Diagnosis not present

## 2020-11-05 NOTE — Progress Notes (Signed)
Donna Simmons 80 y.o.   Chief Complaint  Patient presents with   hospital f/u    Patient stated she is a little better. Still taking medication    HISTORY OF PRESENT ILLNESS: This is a 80 y.o. female here for follow-up of emergency room visit on 10/22/2020 when she was seen for urinary frequency. Urinalysis was positive for red blood cells but urine culture was negative.  Symptoms today much improved. Patient has some degree of dementia, at her baseline's today. No new complaints or any new concerns.  HPI   Prior to Admission medications   Medication Sig Start Date End Date Taking? Authorizing Provider  acetaminophen (TYLENOL) 325 MG tablet Take 325-650 mg by mouth daily as needed (pain or headaches).    Yes [provider]  amLODipine (NORVASC) 10 MG tablet Take 10 mg by mouth daily. 09/14/17  Yes [provider]  Blood Pressure Monitoring (BLOOD PRESSURE CUFF) MISC Take your  blood pressure with blood pressure cuff/moniotor- can be automatic or manual - one to three  times a week as needed 04/20/19  Yes Marykay Lex, MD  cephALEXin (KEFLEX) 500 MG capsule Take 1 capsule (500 mg total) by mouth 4 (four) times daily. 08/30/20  Yes Liberty Handy, PA-C  ELIQUIS 5 MG TABS tablet Take 1 tablet by mouth twice daily 08/10/20  Yes Marykay Lex, MD  ENSURE (ENSURE) Take 237 mLs by mouth daily.   Yes [provider]  flecainide (TAMBOCOR) 50 MG tablet Take 1 tablet by mouth twice daily 07/02/20  Yes Marykay Lex, MD  fluticasone [facility]) 50 MCG/ACT nasal spray USE TWO SPRAY(S) IN EACH NOSTRIL ONCE DAILY Patient taking differently: Place 2 sprays into both nostrils daily as needed for allergies. 01/05/17  Yes [name], MD  furosemide (LASIX) 20 MG tablet Take 1 tablet by mouth every Monday, Wednesday and Friday and/ or as needed for swelling. 08/07/20  Yes Marykay Lex, MD  lisinopril (ZESTRIL) 20 MG tablet Take 1 tablet by mouth once daily  08/14/20  Yes Marykay Lex, MD  memantine [facility]) 5 MG tablet Take 1 tablet daily for one week, then take 1 tablet twice daily for one week, then take 1 tablet in the morning and 2 in the evening for one week, then take 2 tablets twice daily 07/27/19  Yes York Spaniel, MD  metoprolol succinate (TOPROL-XL) 50 MG 24 hr tablet TAKE 1 TABLET BY MOUTH ONCE DAILY WITH OR IMMEDIATELY FOLLOWING A MEAL 09/26/20  Yes Marykay Lex, MD  metoprolol tartrate (LOPRESSOR) 50 MG tablet Take 1 tablet only with flecainide as needed for rapid fast heart rate 08/08/19  Yes Marykay Lex, MD  Multiple Vitamins-Minerals (CENTRUM SILVER 50+WOMEN) TABS Take 1 tablet by mouth daily with lunch.   Yes [provider]  MYRBETRIQ 25 MG TB24 tablet Take 25 mg by mouth daily. 09/15/20  Yes [provider]  omeprazole (PRILOSEC) 40 MG capsule  01/23/20  Yes [provider]  rosuvastatin (CRESTOR) 20 MG tablet Take 1 tablet by mouth once daily 08/08/20  Yes Marykay Lex, MD  sucralfate (CARAFATE) 1 GM/10ML suspension Take 10 mLs (1 g total) by mouth 4 (four) times daily -  with meals and at bedtime. 01/05/18  Yes [name], MD    Allergies  Allergen Reactions   Antihistamines, Chlorpheniramine-Type Palpitations    Makes heart beat fast    Patient Active Problem List   Diagnosis Date Noted  Bilateral lower extremity edema 08/19/2020   Other forms of angina pectoris (HCC) 07/25/2018   Long term current use of anticoagulant therapy 11/18/2012   Hypercholesteremia 09/25/2012   DDD (degenerative disc disease), cervical 05/19/2012   Paroxysmal atrial fibrillation (HCC) - CHA2DS2-VASc Score 3, on Eliquis 04/20/2012   Essential hypertension 04/20/2012    Past Medical History:  Diagnosis Date   Allergy    GERD (gastroesophageal reflux disease)    Heart murmur    No significant valvular lesion noted on echo.   Hypercholesteremia    Hypertension    Memory loss     Paroxysmal atrial fibrillation (HCC) 12/27/2008    Past Surgical History:  Procedure Laterality Date   ABDOMINAL HYSTERECTOMY  1980   BREAST SURGERY     CT CTA CORONARY W/CA SCORE W/CM &/OR WO/CM  08/2018   Coronary calcium score 24.  Nonobstructive CAD with less than 30% plaque in proximal LAD.  Normal aortic root. ->  Relook in October 2020 had significant motion artifact but coronary calcium score 26.   EYE SURGERY Right 12/29/2016   EYE SURGERY Left 01/19/2017   FRACTURE SURGERY     LEFT HEART CATH AND CORONARY ANGIOGRAPHY  12/16/2010   no evidence of CAD to explain anginal pain w/ positive troponin.  potential etiology is breakthrough AF   NM MYOVIEW LTD  April 2010   EF 64%, normal pattern of perfusion in all regions, no scintigraphic evidence of inducible ischemia; no significant wall motion abnormalities; EKG negative for ischemia; no significant change from last study; low risk scan   TRANSTHORACIC ECHOCARDIOGRAM  07/2018   Normal EF 55-60%.  No RWMA. GR 1 DD. Ao Sclerosis - no Stenosis. Mild AI. Trivial MR.     Social History   Socioeconomic History   Marital status: Married    Spouse name: Not on file   Number of children: Not on file   Years of education: Not on file   Highest education level: Not on file  Occupational History   Not on file  Tobacco Use   Smoking status: Never Smoker   Smokeless tobacco: Never Used  Vaping Use   Vaping Use: Never used  Substance and Sexual Activity   Alcohol use: No    Alcohol/week: 0.0 standard drinks   Drug use: No   Sexual activity: Yes  Other Topics Concern   Not on file  Social History Narrative   Married with no children. Walks several times a week at least 3-4 times a week about 2 miles a time. Does not smoke and does not drink.   Social Determinants of Health   Financial Resource Strain: Not on file  Food Insecurity: Not on file  Transportation Needs: Not on file  Physical Activity: Not on  file  Stress: Not on file  Social Connections: Not on file  Intimate Partner Violence: Not on file    Family History  Problem Relation Age of Onset   Arthritis Mother        OSTEO   Hypertension Mother    Dementia Mother    Heart disease Father    Hypertension Sister    Dementia Sister    Cancer - Lung Brother    Cancer Maternal Grandmother    Heart disease Maternal Grandfather    Cancer - Lung Brother    Hypertension Brother    Cancer - Prostate Brother      Review of Systems  Constitutional: Negative.  Negative for chills and fever.  HENT:  Negative.  Negative for congestion and sore throat.   Respiratory: Negative.  Negative for cough and shortness of breath.   Cardiovascular: Negative.  Negative for chest pain and palpitations.  Gastrointestinal: Negative for abdominal pain, blood in stool, diarrhea, melena, nausea and vomiting.  Genitourinary: Negative.  Negative for dysuria and hematuria.  Musculoskeletal: Negative.  Negative for myalgias and neck pain.  Skin: Negative.  Negative for rash.  Neurological: Negative.  Negative for dizziness and headaches.  All other systems reviewed and are negative.  Today's Vitals   11/05/20 1105  BP: 138/81  Pulse: 70  Temp: 97.9 F (36.6 C)  TempSrc: Temporal  SpO2: 99%  Weight: 126 lb 6.4 oz (57.3 kg)  Height: 5\' 3"  (1.6 m)   Body mass index is 22.39 kg/m.   Physical Exam Vitals reviewed.  Constitutional:      Appearance: Normal appearance.  HENT:     Head: Normocephalic.  Eyes:     Extraocular Movements: Extraocular movements intact.     Pupils: Pupils are equal, round, and reactive to light.  Cardiovascular:     Rate and Rhythm: Normal rate and regular rhythm.     Pulses: Normal pulses.     Heart sounds: Normal heart sounds.  Pulmonary:     Effort: Pulmonary effort is normal.     Breath sounds: Normal breath sounds.  Abdominal:     General: Bowel sounds are normal. There is no distension.      Palpations: Abdomen is soft.     Tenderness: There is no right CVA tenderness or left CVA tenderness.  Musculoskeletal:        General: Normal range of motion.     Cervical back: Normal range of motion.  Skin:    General: Skin is warm and dry.     Capillary Refill: Capillary refill takes less than 2 seconds.  Neurological:     General: No focal deficit present.     Mental Status: She is alert and oriented to person, place, and time.  Psychiatric:        Mood and Affect: Mood normal.        Behavior: Behavior normal.      ASSESSMENT & PLAN: Kinzlie was seen today for hospital f/u.  Diagnoses and all orders for this visit:  Urinary frequency -     Urine Culture -     POCT urinalysis dipstick    Patient Instructions       If you have lab work done today you will be contacted with your lab results within the next 2 weeks.  If you have not heard from [name] then please contact us. The fastest way to get your results is to register for My Chart.   IF you received an x-ray today, you will receive an invoice from [facility]. Please contact [facility] at [phone] with questions or concerns regarding your invoice.   IF you received labwork today, you will receive an invoice from [facility]. Please contact [facility] at [phone] with questions or concerns regarding your invoice.   Our billing staff will not be able to assist you with questions regarding bills from these companies.  You will be contacted with the lab results as soon as they are available. The fastest way to get your results is to activate your My Chart account. Instructions are located on the last page of this paperwork. If you have not heard from [phone] regarding the results in 2 weeks, please contact this office.  Urinary Frequency, Adult Urinary frequency means urinating more often than usual. You may urinate every 1-2 hours even though you drink a normal amount of fluid and do not have a  bladder infection or condition. Although you urinate more often than normal, the total amount of urine produced in a day is normal. With urinary frequency, you may have an urgent need to urinate often. The stress and anxiety of needing to find a bathroom quickly can make this urge worse. This condition may go away on its own or you may need treatment at home. Home treatment may include bladder training, exercises, taking medicines, or making changes to your diet. Follow these instructions at home: Bladder health  Keep a bladder diary if told by your health care provider. Keep track of: ? What you eat and drink. ? How often you urinate. ? How much you urinate.  Follow a bladder training program if told by your health care provider. This may include: ? Learning to delay going to the bathroom. ? Double urinating (voiding). This helps if you are not completely emptying your bladder. ? Scheduled voiding.  Do Kegel exercises as told by your health care provider. Kegel exercises strengthen the muscles that help control urination, which may help the condition.   Eating and drinking  If told by your health care provider, make diet changes, such as: ? Avoiding caffeine. ? Drinking fewer fluids, especially alcohol. ? Not drinking in the evening. ? Avoiding foods or drinks that may irritate the bladder. These include coffee, tea, soda, artificial sweeteners, citrus, tomato-based foods, and chocolate. ? Eating foods that help prevent or ease constipation. Constipation can make this condition worse. Your health care provider may recommend that you:  Drink enough fluid to keep your urine pale yellow.  Take over-the-counter or prescription medicines.  Eat foods that are high in fiber, such as beans, whole grains, and fresh fruits and vegetables.  Limit foods that are high in fat and processed sugars, such as fried or sweet foods. General instructions  Take over-the-counter and prescription  medicines only as told by your health care provider.  Keep all follow-up visits as told by your health care provider. This is important. Contact a health care provider if:  You start urinating more often.  You feel pain or irritation when you urinate.  You notice blood in your urine.  Your urine looks cloudy.  You develop a fever.  You begin vomiting. Get help right away if:  You are unable to urinate. Summary  Urinary frequency means urinating more often than usual. With urinary frequency, you may urinate every 1-2 hours even though you drink a normal amount of fluid and do not have a bladder infection or other bladder condition.  Your health care provider may recommend that you keep a bladder diary, follow a bladder training program, or make dietary changes.  If told by your health care provider, do Kegel exercises to strengthen the muscles that help control urination.  Take over-the-counter and prescription medicines only as told by your health care provider.  Contact a health care provider if your symptoms do not improve or get worse. This information is not intended to replace advice given to you by your health care provider. Make sure you discuss any questions you have with your health care provider. Document Revised: 02/25/2018 Document Reviewed: 02/25/2018 Elsevier Patient Education  2021 Elsevier Inc.      Edwina Barth, MD [facility]

## 2020-11-05 NOTE — Patient Instructions (Addendum)
If you have lab work done today you will be contacted with your lab results within the next 2 weeks.  If you have not heard from [place] then please contact us. The fastest way to get your results is to register for My Chart.   IF you received an x-ray today, you will receive an invoice from [facility]. Please contact [facility] at [phone] with questions or concerns regarding your invoice.   IF you received labwork today, you will receive an invoice from [facility]. Please contact [facility] at [phone] with questions or concerns regarding your invoice.   Our billing staff will not be able to assist you with questions regarding bills from these companies.  You will be contacted with the lab results as soon as they are available. The fastest way to get your results is to activate your My Chart account. Instructions are located on the last page of this paperwork. If you have not heard from [place] regarding the results in 2 weeks, please contact this office.      Urinary Frequency, Adult Urinary frequency means urinating more often than usual. You may urinate every 1-2 hours even though you drink a normal amount of fluid and do not have a bladder infection or condition. Although you urinate more often than normal, the total amount of urine produced in a day is normal. With urinary frequency, you may have an urgent need to urinate often. The stress and anxiety of needing to find a bathroom quickly can make this urge worse. This condition may go away on its own or you may need treatment at home. Home treatment may include bladder training, exercises, taking medicines, or making changes to your diet. Follow these instructions at home: Bladder health  Keep a bladder diary if told by your health care provider. Keep track of: ? What you eat and drink. ? How often you urinate. ? How much you urinate.  Follow a bladder training program if told by your health care provider. This  may include: ? Learning to delay going to the bathroom. ? Double urinating (voiding). This helps if you are not completely emptying your bladder. ? Scheduled voiding.  Do Kegel exercises as told by your health care provider. Kegel exercises strengthen the muscles that help control urination, which may help the condition.   Eating and drinking  If told by your health care provider, make diet changes, such as: ? Avoiding caffeine. ? Drinking fewer fluids, especially alcohol. ? Not drinking in the evening. ? Avoiding foods or drinks that may irritate the bladder. These include coffee, tea, soda, artificial sweeteners, citrus, tomato-based foods, and chocolate. ? Eating foods that help prevent or ease constipation. Constipation can make this condition worse. Your health care provider may recommend that you:  Drink enough fluid to keep your urine pale yellow.  Take over-the-counter or prescription medicines.  Eat foods that are high in fiber, such as beans, whole grains, and fresh fruits and vegetables.  Limit foods that are high in fat and processed sugars, such as fried or sweet foods. General instructions  Take over-the-counter and prescription medicines only as told by your health care provider.  Keep all follow-up visits as told by your health care provider. This is important. Contact a health care provider if:  You start urinating more often.  You feel pain or irritation when you urinate.  You notice blood in your urine.  Your urine looks cloudy.  You develop a fever.  You begin  vomiting. Get help right away if:  You are unable to urinate. Summary  Urinary frequency means urinating more often than usual. With urinary frequency, you may urinate every 1-2 hours even though you drink a normal amount of fluid and do not have a bladder infection or other bladder condition.  Your health care provider may recommend that you keep a bladder diary, follow a bladder training  program, or make dietary changes.  If told by your health care provider, do Kegel exercises to strengthen the muscles that help control urination.  Take over-the-counter and prescription medicines only as told by your health care provider.  Contact a health care provider if your symptoms do not improve or get worse. This information is not intended to replace advice given to you by your health care provider. Make sure you discuss any questions you have with your health care provider. Document Revised: 02/25/2018 Document Reviewed: 02/25/2018 Elsevier Patient Education  2021 [employer].

## 2020-11-08 DIAGNOSIS — R350 Frequency of micturition: Secondary | ICD-10-CM | POA: Diagnosis not present

## 2020-11-10 ENCOUNTER — Other Ambulatory Visit: Payer: Self-pay | Admitting: Emergency Medicine

## 2020-11-10 ENCOUNTER — Telehealth: Payer: Self-pay | Admitting: Emergency Medicine

## 2020-11-10 DIAGNOSIS — N3001 Acute cystitis with hematuria: Secondary | ICD-10-CM

## 2020-11-10 MED ORDER — CEFUROXIME AXETIL 500 MG PO TABS: 500.0000 mg | ORAL_TABLET | Freq: Two times a day (BID) | ORAL | 0 refills | Status: DC

## 2020-11-10 NOTE — Telephone Encounter (Signed)
Spoke to husband about urine culture results and need to start antibiotic. Prescription for Ceftin 500 mg bid x 7 days sent.

## 2020-11-11 LAB — URINE CULTURE

## 2020-11-17 ENCOUNTER — Other Ambulatory Visit: Payer: Self-pay | Admitting: Emergency Medicine

## 2020-11-17 DIAGNOSIS — N3001 Acute cystitis with hematuria: Secondary | ICD-10-CM

## 2020-11-17 NOTE — Telephone Encounter (Signed)
Requested medication (s) are due for refill today: -  Requested medication (s) are on the active medication list: expired  Last refill:  11/10/20 #14  Future visit scheduled: no  Notes to clinic:  med not assigned to a protocol   Requested Prescriptions  Pending Prescriptions Disp Refills   cefUROXime (CEFTIN) 500 MG tablet [Pharmacy Med Name: Cefuroxime Axetil 500 MG Oral Tablet] 14 tablet 0    Sig: TAKE 1 TABLET BY MOUTH TWICE DAILY WITH A MEAL FOR 7 DAYS      Off-Protocol Failed - 11/17/2020  1:30 PM      Failed - Medication not assigned to a protocol, review manually.      Passed - Valid encounter within last 12 months    Recent Outpatient Visits           1 week ago Urinary frequency   [facility] at [facility], Eilleen Kempf, MD   7 months ago Acute head injury, initial encounter   [facility] at [facility], [place], [place]   7 months ago Senile dementia without behavioral disturbance [facility])   [facility] at [facility], Eilleen Kempf, MD   1 year ago Hallucinations   [facility] Care at [facility], Eilleen Kempf, MD   1 year ago Essential hypertension   [facility] at [facility], [place], MD

## 2020-11-19 DIAGNOSIS — Z86010 Personal history of colonic polyps: Secondary | ICD-10-CM | POA: Diagnosis not present

## 2020-11-19 DIAGNOSIS — R140 Abdominal distension (gaseous): Secondary | ICD-10-CM | POA: Diagnosis not present

## 2020-11-19 DIAGNOSIS — K219 Gastro-esophageal reflux disease without esophagitis: Secondary | ICD-10-CM | POA: Diagnosis not present

## 2020-11-19 DIAGNOSIS — I482 Chronic atrial fibrillation, unspecified: Secondary | ICD-10-CM | POA: Diagnosis not present

## 2020-11-19 NOTE — Telephone Encounter (Signed)
If she is still having UTI symptoms?  Thanks.

## 2020-11-19 NOTE — Telephone Encounter (Signed)
Patient is requesting a refill of the following medications: Requested Prescriptions   Pending Prescriptions Disp Refills   cefUROXime (CEFTIN) 500 MG tablet [Pharmacy Med Name: Cefuroxime Axetil 500 MG Oral Tablet] 14 tablet 0    Sig: TAKE 1 TABLET BY MOUTH TWICE DAILY WITH A MEAL FOR 7 DAYS    Date of patient request: 11/17/20 Last office visit: 11/05/20 Date of last refill: 11/10/20 Last refill amount:14 Follow up time period per chart:

## 2020-12-05 ENCOUNTER — Other Ambulatory Visit: Payer: Self-pay | Admitting: Emergency Medicine

## 2020-12-05 DIAGNOSIS — N3001 Acute cystitis with hematuria: Secondary | ICD-10-CM

## 2020-12-27 ENCOUNTER — Ambulatory Visit: Payer: Medicare PPO | Admitting: Sports Medicine

## 2020-12-27 ENCOUNTER — Encounter: Payer: Self-pay | Admitting: Sports Medicine

## 2020-12-27 ENCOUNTER — Other Ambulatory Visit: Payer: Self-pay

## 2020-12-27 DIAGNOSIS — R413 Other amnesia: Secondary | ICD-10-CM

## 2020-12-27 DIAGNOSIS — B351 Tinea unguium: Secondary | ICD-10-CM

## 2020-12-27 DIAGNOSIS — I890 Lymphedema, not elsewhere classified: Secondary | ICD-10-CM

## 2020-12-27 DIAGNOSIS — M79675 Pain in left toe(s): Secondary | ICD-10-CM | POA: Diagnosis not present

## 2020-12-27 DIAGNOSIS — I739 Peripheral vascular disease, unspecified: Secondary | ICD-10-CM

## 2020-12-27 DIAGNOSIS — M79674 Pain in right toe(s): Secondary | ICD-10-CM | POA: Diagnosis not present

## 2020-12-27 NOTE — Progress Notes (Signed)
Subjective: Donna Simmons is a 80 y.o. female patient seen today in office with complaint of mildly painful thickened and elongated toenails; unable to trim. Patient has history of edema with chronic swelling in both legs. No other issues noted.  Patient Active Problem List   Diagnosis Date Noted  . Bilateral lower extremity edema 08/19/2020  . Other forms of angina pectoris (HCC) 07/25/2018  . Long term current use of anticoagulant therapy 11/18/2012  . Hypercholesteremia 09/25/2012  . DDD (degenerative disc disease), cervical 05/19/2012  . Paroxysmal atrial fibrillation (HCC) - CHA2DS2-VASc Score 3, on Eliquis 04/20/2012  . Essential hypertension 04/20/2012    Current Outpatient Medications on File Prior to Visit  Medication Sig Dispense Refill  . acetaminophen (TYLENOL) 325 MG tablet Take 325-650 mg by mouth daily as needed (pain or headaches).     Marland Kitchen amLODipine (NORVASC) 10 MG tablet Take 10 mg by mouth daily.    . Blood Pressure Monitoring (BLOOD PRESSURE CUFF) MISC Take your  blood pressure with blood pressure cuff/moniotor- can be automatic or manual - one to three  times a week as needed 1 each 0  . cefUROXime (CEFTIN) 500 MG tablet TAKE 1 TABLET BY MOUTH TWICE DAILY WITH A MEAL FOR 7 DAYS 14 tablet 0  . ELIQUIS 5 MG TABS tablet Take 1 tablet by mouth twice daily 180 tablet 1  . ENSURE (ENSURE) Take 237 mLs by mouth daily.    . flecainide (TAMBOCOR) 50 MG tablet Take 1 tablet by mouth twice daily 180 tablet 2  . fluticasone (FLONASE) 50 MCG/ACT nasal spray USE TWO SPRAY(S) IN EACH NOSTRIL ONCE DAILY (Patient taking differently: Place 2 sprays into both nostrils daily as needed for allergies.) 16 g 1  . furosemide (LASIX) 20 MG tablet Take 1 tablet by mouth every Monday, Wednesday and Friday and/ or as needed for swelling. 90 tablet 3  . lisinopril (ZESTRIL) 20 MG tablet Take 1 tablet by mouth once daily 90 tablet 3  . memantine (NAMENDA) 5 MG tablet Take 1 tablet daily for one  week, then take 1 tablet twice daily for one week, then take 1 tablet in the morning and 2 in the evening for one week, then take 2 tablets twice daily 70 tablet 0  . metoprolol succinate (TOPROL-XL) 50 MG 24 hr tablet TAKE 1 TABLET BY MOUTH ONCE DAILY WITH OR IMMEDIATELY FOLLOWING A MEAL 90 tablet 0  . metoprolol tartrate (LOPRESSOR) 50 MG tablet Take 1 tablet only with flecainide as needed for rapid fast heart rate 10 tablet 6  . Multiple Vitamins-Minerals (CENTRUM SILVER 50+WOMEN) TABS Take 1 tablet by mouth daily with lunch.    Marland Kitchen MYRBETRIQ 25 MG TB24 tablet Take 25 mg by mouth daily.    Marland Kitchen omeprazole (PRILOSEC) 40 MG capsule     . rosuvastatin (CRESTOR) 20 MG tablet Take 1 tablet by mouth once daily 90 tablet 3  . sucralfate (CARAFATE) 1 GM/10ML suspension Take 10 mLs (1 g total) by mouth 4 (four) times daily -  with meals and at bedtime. 420 mL 0   No current facility-administered medications on file prior to visit.    Allergies  Allergen Reactions  . Antihistamines, Chlorpheniramine-Type Palpitations    Makes heart beat fast    Objective: Physical Exam  General: Well developed, nourished, no acute distress, awake, alert and oriented x 2 history of memory problems  Vascular: Dorsalis pedis artery 0/4 bilateral, Posterior tibial artery 0/4 bilateral, skin temperature warm to warm proximal to  distal bilateral lower extremities, there is trophic skin changes consistent with lymphedema noted bilateral.  Neurological: Gross sensation present via light touch bilateral.   Dermatological: Skin is warm, dry, and supple bilateral, Nails 1-10 are tender, long, thick, and discolored with mild subungal debris, no webspace macerations present bilateral, no open lesions present bilateral, no callus/corns/hyperkeratotic tissue present bilateral.  Old surgical scars noted to left ankle.  No signs of infection bilateral.  Musculoskeletal: Asymptomatic pes planus boney deformities noted bilateral.  Muscular strength within normal limits without pain on range of motion. No pain with calf compression bilateral.  Assessment and Plan:  Problem List Items Addressed This Visit   None   Visit Diagnoses    Pain due to onychomycosis of toenails of both feet    -  Primary   Lymphedema       PVD (peripheral vascular disease) (HCC)       Memory difficulties          -Examined patient.  -Discussed treatment options for painful mycotic nails and lymphedema. -Mechanically debrided and reduced mycotic nails with sterile nail nipper and dremel nail file without incident. -Recommend elevation limit sodium intake and to also discuss swelling in her legs with her PCP for possible medication management like before especially if she can not tolerate compression -Patient to return in 3 months for follow up evaluation or sooner if symptoms worsen.  Asencion Islam, DPM

## 2021-01-01 ENCOUNTER — Other Ambulatory Visit: Payer: Self-pay | Admitting: Cardiology

## 2021-01-12 ENCOUNTER — Other Ambulatory Visit: Payer: Self-pay | Admitting: Cardiology

## 2021-01-15 ENCOUNTER — Other Ambulatory Visit: Payer: Self-pay

## 2021-01-15 ENCOUNTER — Ambulatory Visit (HOSPITAL_COMMUNITY)
Admission: EM | Admit: 2021-01-15 | Discharge: 2021-01-15 | Disposition: A | Discharge status: Home / Self Care | Payer: Medicare PPO | Attending: Family | Admitting: Family

## 2021-01-15 ENCOUNTER — Encounter (HOSPITAL_COMMUNITY): Payer: Self-pay | Admitting: Emergency Medicine

## 2021-01-15 DIAGNOSIS — I872 Venous insufficiency (chronic) (peripheral): Secondary | ICD-10-CM

## 2021-01-15 DIAGNOSIS — L03116 Cellulitis of left lower limb: Secondary | ICD-10-CM

## 2021-01-15 DIAGNOSIS — Z8679 Personal history of other diseases of the circulatory system: Secondary | ICD-10-CM

## 2021-01-15 DIAGNOSIS — L03115 Cellulitis of right lower limb: Secondary | ICD-10-CM | POA: Diagnosis not present

## 2021-01-15 DIAGNOSIS — I10 Essential (primary) hypertension: Secondary | ICD-10-CM

## 2021-01-15 MED ORDER — BACITRACIN ZINC 500 UNIT/GM EX OINT
TOPICAL_OINTMENT | CUTANEOUS | Status: AC
  Filled 2021-01-15: qty 2.7

## 2021-01-15 MED ORDER — CEPHALEXIN 500 MG PO CAPS: 500.0000 mg | ORAL_CAPSULE | Freq: Three times a day (TID) | ORAL | 0 refills | Status: AC

## 2021-01-15 MED ORDER — FUROSEMIDE 20 MG PO TABS: 20.0000 mg | ORAL_TABLET | Freq: Every day | ORAL | 0 refills | Status: DC

## 2021-01-15 NOTE — ED Triage Notes (Signed)
Pt brought in by husband with c/o of wounds to BLE with drainage x 1.5 months. Denies fever. She has Dementia.

## 2021-01-15 NOTE — Discharge Instructions (Signed)
Start antibiotic Keflex 500mg  3 times a day for 10 days. May start Lasix 20mg  tablets in the morning to help with swelling/fluid in lower legs. Take daily for 5 days and then continue depending upon instructions from your primary care provider. Continue to change bandages daily to legs. Do not apply any lotrimin, alcohol or hydrogen peroxide to lower legs. Wash with soap and water only. Keep legs elevated as much as possible. Follow-up with your Primary care provider this week for recheck or contact the [facility] (Dr. ) today to schedule appointment for further evaluation. If redness, swelling or ulcers get worse, go to the ER ASAP. Otherwise, follow-up with your doctors as recommended.

## 2021-01-15 NOTE — ED Provider Notes (Addendum)
[facility]    CSN: [id number] Arrival date & time: 01/15/21  7829      History   Chief Complaint Chief Complaint  Patient presents with  . Rash    BLE    HPI Donna Simmons is a 80 y.o. female.   80 year old female accompanied by her husband with concern over lower leg swelling and open wounds to both lower legs just above ankles. Started with lower leg swelling about 6 to 8 weeks ago. Swelling has gotten worse and now having open wounds that have been draining for the past 2 weeks. Over the past week, discharge has a foul odor and has noticed yellow crusting around wounds. No distinct pain. No distinct numbness. Husband has cleaned area with alcohol and tried Lotrimin ointment with no relief. Recently went to the Podiatrist for mycotic toe nail evaluation about 3 weeks ago (late April)- nails were trimmed and debrided but no oral medication or topical medication prescribed. Ankle and lower leg swelling was noted on exam at Podiatrist and was recommended to follow-up with PCP. No previous history of cellulitis. Patient had seen her PCP in March for urinary concerns and next follow-up appointment is not until September 2022. Last blood work/ kidney function testing was done about 3 months ago and was normal except slight anemia. Other chronic health issues include HTN, hyperlipidemia, Atrial fibrillation, peripheral vascular disease with edema, arthritis, GERD, and memory loss. Current medication include Lisinopril, Amlodipine, Metoprolol (uncertain if taking Toprol XL and generic- patient did not bring medications with her), Eliquis, Crestor, Flecainide, Namenda, Flonase, Myrbetriq, Prilosec and Carafate daily and Tylenol prn. Has been on Lasix in the past for peripheral edema with good success and no distinct side effects.   The history is provided by the patient and the spouse.    Past Medical History:  Diagnosis Date  . Allergy   . GERD (gastroesophageal reflux disease)    . Heart murmur    No significant valvular lesion noted on echo.  . Hypercholesteremia   . Hypertension   . Memory loss   . Paroxysmal atrial fibrillation (HCC) 12/27/2008    Patient Active Problem List   Diagnosis Date Noted  . Bilateral lower extremity edema 08/19/2020  . Other forms of angina pectoris (HCC) 07/25/2018  . Long term current use of anticoagulant therapy 11/18/2012  . Hypercholesteremia 09/25/2012  . DDD (degenerative disc disease), cervical 05/19/2012  . Paroxysmal atrial fibrillation (HCC) - CHA2DS2-VASc Score 3, on Eliquis 04/20/2012  . Essential hypertension 04/20/2012    Past Surgical History:  Procedure Laterality Date  . ABDOMINAL HYSTERECTOMY  1980  . BREAST SURGERY    . CT CTA CORONARY W/CA SCORE W/CM &/OR WO/CM  08/2018   Coronary calcium score 24.  Nonobstructive CAD with less than 30% plaque in proximal LAD.  Normal aortic root. ->  Relook in October 2020 had significant motion artifact but coronary calcium score 26.  Marland Kitchen EYE SURGERY Right 12/29/2016  . EYE SURGERY Left 01/19/2017  . FRACTURE SURGERY    . LEFT HEART CATH AND CORONARY ANGIOGRAPHY  12/16/2010   no evidence of CAD to explain anginal pain w/ positive troponin.  potential etiology is breakthrough AF  . NM MYOVIEW LTD  April 2010   EF 64%, normal pattern of perfusion in all regions, no scintigraphic evidence of inducible ischemia; no significant wall motion abnormalities; EKG negative for ischemia; no significant change from last study; low risk scan  . TRANSTHORACIC ECHOCARDIOGRAM  07/2018  Normal EF 55-60%.  No RWMA. GR 1 DD. Ao Sclerosis - no Stenosis. Mild AI. Trivial MR.     OB History   No obstetric history on file.      Home Medications    Prior to Admission medications   Medication Sig Start Date End Date Taking? Authorizing Provider  cephALEXin (KEFLEX) 500 MG capsule Take 1 capsule (500 mg total) by mouth 3 (three) times daily for 10 days. 01/15/21 01/25/21 Yes Keontay Vora, Ali LoweAnn  Berry, NP  furosemide (LASIX) 20 MG tablet Take 1 tablet (20 mg total) by mouth daily. Take daily for 5 days then continue medication per Primary Care provider guidance. 01/15/21  Yes Malyk Girouard, Ali LoweAnn Berry, NP  acetaminophen (TYLENOL) 325 MG tablet Take 325-650 mg by mouth daily as needed (pain or headaches).     [provider]  amLODipine (NORVASC) 10 MG tablet Take 10 mg by mouth daily. 09/14/17   [provider]  Blood Pressure Monitoring (BLOOD PRESSURE CUFF) MISC Take your  blood pressure with blood pressure cuff/moniotor- can be automatic or manual - one to three  times a week as needed 04/20/19   Marykay LexHarding, David W, MD  ELIQUIS 5 MG TABS tablet Take 1 tablet by mouth twice daily 08/10/20   Marykay LexHarding, David W, MD  ENSURE (ENSURE) Take 237 mLs by mouth daily.    [provider]  flecainide (TAMBOCOR) 50 MG tablet TAKE 1 TABLET BY MOUTH TWICE DAILY . APPOINTMENT REQUIRED FOR FUTURE REFILLS 01/14/21   Marykay LexHarding, David W, MD  fluticasone [facility](FLONASE) 50 MCG/ACT nasal spray USE TWO SPRAY(S) IN EACH NOSTRIL ONCE DAILY Patient taking differently: Place 2 sprays into both nostrils daily as needed for allergies. 01/05/17   Shade FloodGreene, Jeffrey R, MD  lisinopril (ZESTRIL) 20 MG tablet Take 1 tablet by mouth once daily 08/14/20   Marykay LexHarding, David W, MD  memantine [facility](NAMENDA) 5 MG tablet Take 1 tablet daily for one week, then take 1 tablet twice daily for one week, then take 1 tablet in the morning and 2 in the evening for one week, then take 2 tablets twice daily 07/27/19   York SpanielWillis, Charles K, MD  metoprolol succinate (TOPROL-XL) 50 MG 24 hr tablet TAKE 1 TABLET BY MOUTH ONCE DAILY WITH OR IMMEDIATELY FOLLOWING A MEAL 01/02/21   Marykay LexHarding, David W, MD  metoprolol tartrate (LOPRESSOR) 50 MG tablet Take 1 tablet only with flecainide as needed for rapid fast heart rate 08/08/19   Marykay LexHarding, David W, MD  Multiple Vitamins-Minerals (CENTRUM SILVER 50+WOMEN) TABS Take 1 tablet by mouth daily with lunch.    [provider]  MYRBETRIQ 25 MG TB24 tablet Take 25 mg by mouth daily. 09/15/20   [provider]  omeprazole (PRILOSEC) 40 MG capsule  01/23/20   [provider]  rosuvastatin (CRESTOR) 20 MG tablet Take 1 tablet by mouth once daily 08/08/20   Marykay LexHarding, David W, MD  sucralfate (CARAFATE) 1 GM/10ML suspension Take 10 mLs (1 g total) by mouth 4 (four) times daily -  with meals and at bedtime. 01/05/18   Shade FloodGreene, Jeffrey R, MD    Family History Family History  Problem Relation Age of Onset  . Arthritis Mother        OSTEO  . Hypertension Mother   . Dementia Mother   . Heart disease Father   . Hypertension Sister   . Dementia Sister   . Cancer - Lung Brother   . Cancer Maternal Grandmother   . Heart disease Maternal Grandfather   .  Cancer - Lung Brother   . Hypertension Brother   . Cancer - Prostate Brother     Social History Social History   Tobacco Use  . Smoking status: Never Smoker  . Smokeless tobacco: Never Used  Vaping Use  . Vaping Use: Never used  Substance Use Topics  . Alcohol use: No    Alcohol/week: 0.0 standard drinks  . Drug use: No     Allergies   Antihistamines, chlorpheniramine-type   Review of Systems Review of Systems  Constitutional: Negative for activity change, appetite change, chills, fatigue and fever.  Respiratory: Negative for cough, chest tightness and shortness of breath.   Cardiovascular: Positive for leg swelling. Negative for chest pain and palpitations (not currently).  Gastrointestinal: Negative for nausea and vomiting.  Genitourinary: Negative for difficulty urinating and hematuria.  Musculoskeletal: Positive for arthralgias. Negative for gait problem.  Skin: Positive for color change and wound.  Allergic/Immunologic: Negative for environmental allergies and food allergies.  Neurological: Negative for dizziness, tremors, seizures, syncope, speech difficulty, weakness, numbness and headaches.  Hematological: Negative for  adenopathy. Bruises/bleeds easily.     Physical Exam Triage Vital Signs ED Triage Vitals  Enc Vitals Group     BP 01/15/21 0836 (!) 162/81     Pulse Rate 01/15/21 0836 98     Resp 01/15/21 0836 18     Temp 01/15/21 0836 98 F (36.7 C)     Temp Source 01/15/21 0836 Oral     SpO2 01/15/21 0836 100 %     Weight --      Height --      Head Circumference --      Peak Flow --      Pain Score 01/15/21 0833 0     Pain Loc --      Pain Edu? --      Excl. in GC? --    No data found.  Updated Vital Signs BP (!) 162/81 (BP Location: Right Arm)   Pulse 98   Temp 98 F (36.7 C) (Oral)   Resp 18   SpO2 100%   Visual Acuity Right Eye Distance:   Left Eye Distance:   Bilateral Distance:    Right Eye Near:   Left Eye Near:    Bilateral Near:     Physical Exam Vitals and nursing note reviewed.  Constitutional:      General: She is awake.     Appearance: She is well-developed and well-groomed.     Comments: She is sitting comfortably in the exam chair in no acute distress.   HENT:     Head: Normocephalic and atraumatic.  Eyes:     Extraocular Movements: Extraocular movements intact.     Conjunctiva/sclera: Conjunctivae normal.  Cardiovascular:     Rate and Rhythm: Normal rate and regular rhythm.     Heart sounds: Normal heart sounds.  Pulmonary:     Effort: Pulmonary effort is normal. No tachypnea or respiratory distress.     Breath sounds: Normal breath sounds and air entry. No stridor or decreased air movement. No decreased breath sounds, wheezing, rhonchi or rales.  Musculoskeletal:        General: Swelling present. No tenderness. Normal range of motion.     Right lower leg: Swelling present. No tenderness. 2+ Pitting Edema present.     Left lower leg: Swelling present. No tenderness. 2+ Pitting Edema present.       Legs:     Comments: Peripheral Edema present in both lower  legs and ankles with swelling extending to mid dorsal aspect of both feet. Darker discoloration  of skin of lower legs- right darker than left. Multiple open wounds present on right lower leg frontal area and medial. No distinct wounds on lower ankles or feet. Most wounds with only slight yellowish drainage and slight odor. Most areas crusted over or dried drainage down both ankles. 2+ pitting edema both lower legs and dorsal aspect of foot. No distinct tenderness present. Decreased dorsal pedal pulses bilaterally but good capillary refill in toes. Normal sensation and no distinct neuro deficits noted.  See pictures for details.   Skin:    General: Skin is warm and dry.     Capillary Refill: Capillary refill takes less than 2 seconds.     Coloration: Skin is not jaundiced or pale.     Findings: Erythema and wound present. No bruising, ecchymosis or petechiae.  Neurological:     General: No focal deficit present.     Mental Status: She is alert.     Sensory: Sensation is intact. No sensory deficit.     Motor: Motor function is intact.     Comments: Patient is oriented to place but not to time.   Psychiatric:        Attention and Perception: Attention normal.        Mood and Affect: Mood and affect normal.        Speech: Speech normal.        Behavior: Behavior normal. Behavior is cooperative.        Thought Content: Thought content normal.        Cognition and Memory: Memory is impaired. She exhibits impaired remote memory.        Judgment: Judgment normal.     Comments: Patient unable to remember more remote details (unable to tell me what year she got married or how long she has been married for example).             UC Treatments / Results  Labs (all labs ordered are listed, but only abnormal results are displayed) Labs Reviewed - No data to display  EKG   Radiology No results found.  Procedures Wound Care  Date/Time: 01/15/2021 9:15 AM Performed by: Sudie Grumbling, NP Authorized by: Sudie Grumbling, NP   Consent:    Consent obtained:  Verbal   Consent  given by:  Patient and spouse   Risks, benefits, and alternatives were discussed: yes     Risks discussed:  Pain and infection   Alternatives discussed:  Alternative treatment Universal protocol:    Procedure explained and questions answered to patient or proxy's satisfaction: yes     Patient identity confirmed:  Verbally with patient and arm band Sedation:    Sedation type:  None Anesthesia:    Anesthesia method:  None Procedure details:    Indications: open wounds and skin infection     Wound location:  Leg   Leg location:  R lower leg (and left lower leg)   Wound age (days):  10   Area debrided (cm2): See picture for wound areas.   Debridement performed: No     Infected skin BSA:  Up to 10% Dressing:    Dressing applied:  3x12   Wrapped with:  Kling 2 inch and elastic bandage 4 inch Post-procedure details:    Procedure completion:  Tolerated well, no immediate complications Comments:     Cleaned both lower legs and wounds with antiseptic spray- removed  dried crusts and discharge. Applied Bacitracin ointment to open wounds and covered with non-stick gauze, Kling and elastic ace wrap for support. Patient tolerated well with minimal pain and discomfort.    (including critical care time)  Medications Ordered in UC Medications - No data to display  Initial Impression / Assessment and Plan / UC Course  I have reviewed the triage vital signs and the nursing notes.  Pertinent labs & imaging results that were available during my care of the patient were reviewed by me and considered in my medical decision making (see chart for details).    Reviewed with patient and her husband that she has a skin infection of both lower legs (Cellulitis) that is due to swelling and poor circulation. Unable to obtain sufficient wound specimen for evaluation- most open wounds were crusted over or slightly bleeding- not enough discharge present without superficial skin contamination. Cleaned wounds with  antiseptic wound cleaner, applied Bacitracin ointment and covered with non-stick gauze, kling gauze and ace wrap for compression and support.  Will start Keflex 500mg  3 times a day as directed. Will restart Lasix 20mg  daily for the next 5 days and recommend continue this medication pending recommendation from either her PCP/ Wound Care Specialist or Cardiologist. Lasix can decrease potassium level so recommend patient continue Ensure and eat foods high in potassium. Try to keep feet elevated as much as possible. Change bandages daily. Wash area with soap and water. Do not use alcohol, hydrogen peroxide, Lotrimin or other topical treatments. Call Wound Care Specialist today to schedule appointment for further evaluation and treatment. Also call her PCP today to schedule recheck for continuation of Lasix and/or referral back to Cardiology for additional evaluation. If redness, swelling, discharge, ulcers get worse, go to the ER ASAP. Otherwise follow-up with PCP and Specialists as planned.  Final Clinical Impressions(s) / UC Diagnoses   Final diagnoses:  Cellulitis of right lower extremity  Cellulitis of left lower extremity  Edema of both lower extremities due to peripheral venous insufficiency  Elevated blood pressure reading with diagnosis of hypertension  History of atrial fibrillation     Discharge Instructions     Start antibiotic Keflex 500mg  3 times a day for 10 days. May start Lasix 20mg  tablets in the morning to help with swelling/fluid in lower legs. Take daily for 5 days and then continue depending upon instructions from your primary care provider. Continue to change bandages daily to legs. Do not apply any lotrimin, alcohol or hydrogen peroxide to lower legs. Wash with soap and water only. Keep legs elevated as much as possible. Follow-up with your Primary care provider this week for recheck or contact the [facility] (Dr. ) today to schedule appointment for further  evaluation. If redness, swelling or ulcers get worse, go to the ER ASAP. Otherwise, follow-up with your doctors as recommended.     ED Prescriptions    Medication Sig Dispense Auth. Provider   furosemide (LASIX) 20 MG tablet Take 1 tablet (20 mg total) by mouth daily. Take daily for 5 days then continue medication per Primary Care provider guidance. 30 tablet , NP   cephALEXin (KEFLEX) 500 MG capsule Take 1 capsule (500 mg total) by mouth 3 (three) times daily for 10 days. 30 capsule Latoria Dry, , NP     PDMP not reviewed this encounter.   , NP 01/15/21 2159    Ali Lowe, NP 01/15/21 2237

## 2021-02-06 ENCOUNTER — Other Ambulatory Visit: Payer: Self-pay

## 2021-02-06 ENCOUNTER — Encounter (HOSPITAL_BASED_OUTPATIENT_CLINIC_OR_DEPARTMENT_OTHER): Payer: Medicare PPO | Attending: Internal Medicine | Admitting: Physician Assistant

## 2021-02-06 DIAGNOSIS — I480 Paroxysmal atrial fibrillation: Secondary | ICD-10-CM | POA: Diagnosis not present

## 2021-02-06 DIAGNOSIS — L97822 Non-pressure chronic ulcer of other part of left lower leg with fat layer exposed: Secondary | ICD-10-CM | POA: Insufficient documentation

## 2021-02-06 DIAGNOSIS — I10 Essential (primary) hypertension: Secondary | ICD-10-CM | POA: Diagnosis not present

## 2021-02-06 DIAGNOSIS — Z7901 Long term (current) use of anticoagulants: Secondary | ICD-10-CM | POA: Insufficient documentation

## 2021-02-06 DIAGNOSIS — I872 Venous insufficiency (chronic) (peripheral): Secondary | ICD-10-CM | POA: Insufficient documentation

## 2021-02-06 NOTE — Progress Notes (Signed)
Donna Simmons, Donna Simmons ([id number]) Visit Report for 02/06/2021 Abuse/Suicide Risk Screen Details Patient Name: Date of Service: [place], [place]. 02/06/2021 7:30 A M Medical Record Number: [id number] Patient Account Number: [id number] Date of Birth/Sex: Treating RN: 1940-11-27 (80 y.o. Female) Donna Simmons Primary Care Demondre Aguas: SA GA RDIA, MIGUEL Other Clinician: Referring Talynn Lebon: Treating Tykeisha Peer/Extender: Lenda Kelp SA GA RDIA, MIGUEL Weeks in Treatment: 0 Abuse/Suicide Risk Screen Items Answer ABUSE RISK SCREEN: Has anyone close to you tried to hurt or harm you recentlyo No Do you feel uncomfortable with anyone in your familyo No Has anyone forced you do things that you didnt want to doo No Electronic Signature(s) Signed: 02/06/2021 5:57:05 PM By: Donna Abts RN, BSN Entered By: Donna Simmons on 02/06/2021 08:13:24 -------------------------------------------------------------------------------- Activities of Daily Living Details Patient Name: Date of Service: Donna Simmons, [place] RY P. 02/06/2021 7:30 A M Medical Record Number: [id number] Patient Account Number: [id number] Date of Birth/Sex: Treating RN: 1941/04/07 (80 y.o. Female) Donna Simmons Primary Care Keosha Rossa: SA GA RDIA, MIGUEL Other Clinician: Referring Madigan Rosensteel: Treating Tacey Dimaggio/Extender: Lenda Kelp SA GA RDIA, MIGUEL Weeks in Treatment: 0 Activities of Daily Living Items Answer Activities of Daily Living (Please select one for each item) Drive Automobile Not Able T Medications ake Need Assistance Use T elephone Need Assistance Care for Appearance Completely Able Use T oilet Completely Able Bath / Shower Completely Able Dress Self Completely Able Feed Self Completely Able Walk Completely Able Get In / Out Bed Completely Able Housework Need Assistance Prepare Meals Need Assistance Handle Money Need Assistance Shop for Self Need Assistance Electronic Signature(s) Signed: 02/06/2021 5:57:05 PM By:  Donna Abts RN, BSN Entered By: Donna Simmons on 02/06/2021 08:13:47 -------------------------------------------------------------------------------- Education Screening Details Patient Name: Date of Service: Donna Blalock [place], [name] 02/06/2021 7:30 A M Medical Record Number: [id number] Patient Account Number: [id number] Date of Birth/Sex: Treating RN: 08-11-1941 (80 y.o. Female) Donna Simmons Primary Care Kadasia Kassing: SA GA RDIA, MIGUEL Other Clinician: Referring Corbin Falck: Treating Jacky Dross/Extender: Lenda Kelp SA GA RDIA, MIGUEL Weeks in Treatment: 0 Primary Learner Assessed: Patient Learning Preferences/Education Level/Primary Language Learning Preference: Explanation, Demonstration, Printed Material Highest Education Level: High School Preferred Language: English Cognitive Barrier Language Barrier: No Translator Needed: No Memory Deficit: No Emotional Barrier: No Cultural/Religious Beliefs Affecting Medical Care: No Physical Barrier Impaired Vision: No Impaired Hearing: No Decreased Hand dexterity: No Knowledge/Comprehension Knowledge Level: High Comprehension Level: High Ability to understand written instructions: High Ability to understand verbal instructions: High Motivation Anxiety Level: Calm Cooperation: Cooperative Education Importance: Acknowledges Need Interest in Health Problems: Asks Questions Perception: Coherent Willingness to Engage in Self-Management High Activities: Readiness to Engage in Self-Management High Activities: Electronic Signature(s) Signed: 02/06/2021 5:57:05 PM By: Donna Abts RN, BSN Entered By: Donna Simmons on 02/06/2021 08:14:04 -------------------------------------------------------------------------------- Fall Risk Assessment Details Patient Name: Date of Service: Donna Blalock MS, [name] 02/06/2021 7:30 A M Medical Record Number: [id number] Patient Account Number: [id number] Date of Birth/Sex: Treating RN: 15-Feb-1941 (80 y.o.  Female) Donna Simmons Primary Care Deforrest Bogle: SA GA RDIA, MIGUEL Other Clinician: Referring Tajae Maiolo: Treating Rowene Suto/Extender: Lenda Kelp SA GA RDIA, MIGUEL Weeks in Treatment: 0 Fall Risk Assessment Items Have you had 2 or more falls in the last 12 monthso 0 No Have you had any fall that resulted in injury in the last 12 monthso 0 No FALLS RISK SCREEN History of falling - immediate or within 3 months 0 No Secondary diagnosis (Do you have 2 or more medical diagnoseso) 15 Yes Ambulatory  aid None/bed rest/wheelchair/nurse 0 Yes Crutches/cane/walker 0 No Furniture 0 No Intravenous therapy Access/Saline/Heparin Lock 0 No Gait/Transferring Normal/ bed rest/ wheelchair 0 Yes Weak (short steps with or without shuffle, stooped but able to lift head while walking, may seek 0 No support from furniture) Impaired (short steps with shuffle, may have difficulty arising from chair, head down, impaired 0 No balance) Mental Status Oriented to own ability 0 No Electronic Signature(s) Signed: 02/06/2021 5:57:05 PM By: Donna Abts RN, BSN Entered By: Donna Simmons on 02/06/2021 08:14:38 -------------------------------------------------------------------------------- Foot Assessment Details Patient Name: Date of Service: Donna Blalock MS, [name] 02/06/2021 7:30 A M Medical Record Number: [id number] Patient Account Number: [id number] Date of Birth/Sex: Treating RN: 1940/11/17 (80 y.o. Female) Donna Simmons Primary Care Elisheva Fallas: SA GA RDIA, MIGUEL Other Clinician: Referring Jeannifer Drakeford: Treating [name]/Extender: Lenda Kelp SA GA RDIA, MIGUEL Weeks in Treatment: 0 Foot Assessment Items [x]  Unable to perform due to altered mental status Site Locations + = Sensation present, - = Sensation absent, C = Callus, U = Ulcer R = Redness, W = Warmth, M = Maceration, PU = Pre-ulcerative lesion F = Fissure, S = Swelling, D = Dryness Assessment Right: Left: Other Deformity: No No Prior Foot  Ulcer: No No Prior Amputation: No No Charcot Joint: No No Ambulatory Status: Ambulatory Without Help Gait: Steady Electronic Signature(s) Signed: 02/06/2021 5:57:05 PM By: 04/08/2021 RN, BSN Entered By: Donna Simmons on 02/06/2021 08:15:18 -------------------------------------------------------------------------------- Nutrition Risk Screening Details Patient Name: Date of Service: 04/08/2021 MS, [name] 02/06/2021 7:30 A M Medical Record Number: 04/08/2021 Patient Account Number: [id number] Date of Birth/Sex: Treating RN: Nov 07, 1940 (80 y.o. Female) (68 Primary Care Omer Puccinelli: SA GA RDIA, MIGUEL Other Clinician: Referring Floretta Petro: Treating Versie Fleener/Extender: Donna Simmons SA GA RDIA, MIGUEL Weeks in Treatment: 0 Height (in): Weight (lbs): Body Mass Index (BMI): Nutrition Risk Screening Items Score Screening NUTRITION RISK SCREEN: I have an illness or condition that made me change the kind and/or amount of food I eat 0 No I eat fewer than two meals per day 0 No I eat few fruits and vegetables, or milk products 0 No I have three or more drinks of beer, liquor or wine almost every day 0 No I have tooth or mouth problems that make it hard for me to eat 0 No I don't always have enough money to buy the food I need 0 No I eat alone most of the time 0 No I take three or more different prescribed or over-the-counter drugs a day 1 Yes Without wanting to, I have lost or gained 10 pounds in the last six months 0 No I am not always physically able to shop, cook and/or feed myself 2 Yes Nutrition Protocols Good Risk Protocol Moderate Risk Protocol 0 Provide education on nutrition High Risk Proctocol Risk Level: Moderate Risk Score: 3 Electronic Signature(s) Signed: 02/06/2021 5:57:05 PM By: 04/08/2021 RN, BSN Entered By: Donna Simmons on 02/06/2021 08:15:12

## 2021-02-06 NOTE — Progress Notes (Signed)
Donna, Simmons ([id number]) Visit Report for 02/06/2021 Chief Complaint Document Details Patient Name: Date of Service: [place] [place] [facility]. 02/06/2021 7:30 A M Medical Record Number: [id number] Patient Account Number: [id number] Date of Birth/Sex: Treating RN: August 07, 1941 (80 y.o. Female) Donna Simmons Primary Care Provider: SA GA RDIA, MIGUEL Other Clinician: Referring Provider: Treating Provider/Extender: Lenda Kelp SA GA RDIA, MIGUEL Weeks in Treatment: 0 Information Obtained from: Patient Chief Complaint Bilateral LE Ulcers Electronic Signature(s) Signed: 02/06/2021 8:44:12 AM By: Lenda Kelp PA-C Entered By: Lenda Kelp on 02/06/2021 08:44:12 -------------------------------------------------------------------------------- HPI Details Patient Name: Date of Service: [facility] [name] 02/06/2021 7:30 A M Medical Record Number: [id number] Patient Account Number: [id number] Date of Birth/Sex: Treating RN: [birth year] (80 y.o. Female) Donna Simmons Primary Care Provider: SA GA RDIA, MIGUEL Other Clinician: Referring Provider: Treating Provider/Extender: Lenda Kelp SA GA RDIA, MIGUEL Weeks in Treatment: 0 History of Present Illness HPI Description: 02/06/2021 upon evaluation today patient presents for initial evaluation here in clinic concerning issues he has been having with her legs. Fortunately there does not appear to be any signs of active infection at this time which is great news. She was given Keflex by the emergency department when she was initially seen here. With that being said the patient did not have any poor response to the Keflex she actually tolerated it quite well and overall seems to be doing quite well at this time. No fevers, chills, nausea, vomiting, or diarrhea. It was on May 17 that she was given the Keflex. Currently they have been putting Neosporin on this followed by Kerlix and Coban. Her husband's been doing this at home. She is on Eliquis due  to atrial fibrillation. Other than atrial fibrillation the patient also is on the long-term anticoagulant therapy with Eliquis secondary to this, has hypertension, and chronic venous insufficiency. She has been told she should wear compression stockings but is not able to get those on. Electronic Signature(s) Signed: 02/06/2021 1:01:22 PM By: Lenda Kelp PA-C Entered By: Lenda Kelp on 02/06/2021 13:01:22 -------------------------------------------------------------------------------- Physical Exam Details Patient Name: Date of Service: [facility] [name] 02/06/2021 7:30 A M Medical Record Number: [id number] Patient Account Number: [id number] Date of Birth/Sex: Treating RN: December 03, 1940 (80 y.o. Female) Donna Simmons Primary Care Provider: Other Clinician: SA GA RDIA, MIGUEL Referring Provider: Treating Provider/Extender: Lenda Kelp SA GA RDIA, MIGUEL Weeks in Treatment: 0 Constitutional patient is hypertensive.. pulse regular and within target range for patient.Marland Kitchen respirations regular, non-labored and within target range for patient.Marland Kitchen temperature within target range for patient.. Well-nourished and well-hydrated in no acute distress. Eyes conjunctiva clear no eyelid edema noted. pupils equal round and reactive to light and accommodation. Ears, Nose, Mouth, and Throat no gross abnormality of ear auricles or external auditory canals. normal hearing noted during conversation. mucus membranes moist. Respiratory normal breathing without difficulty. Cardiovascular 1+ dorsalis pedis/posterior tibialis pulses. 2+ pitting edema of the bilateral lower extremities. Musculoskeletal normal gait and posture. no significant deformity or arthritic changes, no loss or range of motion, no clubbing. Psychiatric this patient is able to make decisions and demonstrates good insight into disease process. Alert and Oriented x 3. pleasant and cooperative. Notes Upon inspection patient's wounds  again are showing signs of some dry and crusty drainage on the surface of the wound. They do not appear to be too deeply seated which is good news. Fortunately I think that this is something we will probably be able to heal  quite effectively and quickly this is great news. Nonetheless I do believe that the patient is in general experiencing fairly good blood flow to her extremities which also should lend itself to much more appropriate healing at this point. She does however have edema that was noted today and I discussed with her that she really needs to have compression therapy probably long-term. With that being said I do believe that we will probably need to order Velcro compression wraps as this may be the only way that she is able to actually get a wrap on and achieve any success in this regard. Patient voiced an understanding. Electronic Signature(s) Signed: 02/06/2021 1:02:30 PM By: Lenda Kelp PA-C Entered By: Lenda Kelp on 02/06/2021 13:02:30 -------------------------------------------------------------------------------- Physician Orders Details Patient Name: Date of Service: [name] 02/06/2021 7:30 A M Medical Record Number: [id number] Patient Account Number: [id number] Date of Birth/Sex: Treating RN: 06-14-1941 (80 y.o. Female) Donna Simmons Primary Care Provider: SA GA RDIA, MIGUEL Other Clinician: Referring Provider: Treating Provider/Extender: Lenda Kelp SA GA RDIA, MIGUEL Weeks in Treatment: 0 Verbal / Phone Orders: No Diagnosis Coding ICD-10 Coding Code Description I87.2 Venous insufficiency (chronic) (peripheral) L97.822 Non-pressure chronic ulcer of other part of left lower leg with fat layer exposed L97.822 Non-pressure chronic ulcer of other part of left lower leg with fat layer exposed I10 Essential (primary) hypertension I48.0 Paroxysmal atrial fibrillation Z79.01 Long term (current) use of anticoagulants Follow-up Appointments ppointment  in 1 week. - with Leonard Schwartz Return A Bathing/ Shower/ Hygiene May shower with protection but do not get wound dressing(s) wet. - use cast protector to to keep wraps dry in the shower shower Edema Control - Lymphedema / SCD / Other Bilateral Lower Extremities Elevate legs to the level of the heart or above for 30 minutes daily and/or when sitting, a frequency of: - throughout the day whenever sitting Avoid standing for long periods of time. Exercise regularly Wound Treatment Wound #1 - Lower Leg Wound Laterality: Right, Anterior Peri-Wound Care: Sween Lotion (Moisturizing lotion) Discharge Instructions: Apply moisturizing lotion as directed Prim Dressing: KerraCel Ag Gelling Fiber Dressing, 4x5 in (silver alginate) ary Discharge Instructions: Apply silver alginate to wound bed as instructed Secondary Dressing: Woven Gauze Sponge, Non-Sterile 4x4 in Discharge Instructions: Apply over primary dressing as directed. Compression Wrap: ThreePress (3 layer compression wrap) Discharge Instructions: Apply three layer compression as directed. Wound #2 - Lower Leg Wound Laterality: Right, Posterior Peri-Wound Care: Sween Lotion (Moisturizing lotion) Discharge Instructions: Apply moisturizing lotion as directed Prim Dressing: KerraCel Ag Gelling Fiber Dressing, 4x5 in (silver alginate) ary Discharge Instructions: Apply silver alginate to wound bed as instructed Secondary Dressing: Woven Gauze Sponge, Non-Sterile 4x4 in Discharge Instructions: Apply over primary dressing as directed. Compression Wrap: ThreePress (3 layer compression wrap) Discharge Instructions: Apply three layer compression as directed. Wound #3 - Lower Leg Wound Laterality: Right, Lateral, Proximal Peri-Wound Care: Sween Lotion (Moisturizing lotion) Discharge Instructions: Apply moisturizing lotion as directed Prim Dressing: KerraCel Ag Gelling Fiber Dressing, 4x5 in (silver alginate) ary Discharge Instructions: Apply silver  alginate to wound bed as instructed Secondary Dressing: Woven Gauze Sponge, Non-Sterile 4x4 in Discharge Instructions: Apply over primary dressing as directed. Compression Wrap: ThreePress (3 layer compression wrap) Discharge Instructions: Apply three layer compression as directed. Wound #4 - Lower Leg Wound Laterality: Right, Lateral, Distal Peri-Wound Care: Sween Lotion (Moisturizing lotion) Discharge Instructions: Apply moisturizing lotion as directed Prim Dressing: KerraCel Ag Gelling Fiber Dressing, 4x5 in (silver alginate) ary Discharge Instructions:  Apply silver alginate to wound bed as instructed Secondary Dressing: Woven Gauze Sponge, Non-Sterile 4x4 in Discharge Instructions: Apply over primary dressing as directed. Compression Wrap: ThreePress (3 layer compression wrap) Discharge Instructions: Apply three layer compression as directed. Wound #5 - Lower Leg Wound Laterality: Left, Medial, Anterior Peri-Wound Care: Sween Lotion (Moisturizing lotion) Discharge Instructions: Apply moisturizing lotion as directed Prim Dressing: KerraCel Ag Gelling Fiber Dressing, 4x5 in (silver alginate) ary Discharge Instructions: Apply silver alginate to wound bed as instructed Secondary Dressing: Woven Gauze Sponge, Non-Sterile 4x4 in Discharge Instructions: Apply over primary dressing as directed. Compression Wrap: ThreePress (3 layer compression wrap) Discharge Instructions: Apply three layer compression as directed. Electronic Signature(s) Signed: 02/06/2021 5:40:25 PM By: Lenda Kelp PA-C Signed: 02/06/2021 6:12:56 PM By: Donna Deed RN, BSN Entered By: Donna Simmons on 02/06/2021 08:52:47 -------------------------------------------------------------------------------- Problem List Details Patient Name: Date of Service: [name] 02/06/2021 7:30 A M Medical Record Number: [id number] Patient Account Number: [id number] Date of Birth/Sex: Treating RN: Sep 24, 1940 (80 y.o.  Female) Donna Simmons Primary Care Provider: SA GA RDIA, MIGUEL Other Clinician: Referring Provider: Treating Provider/Extender: Lenda Kelp SA GA RDIA, MIGUEL Weeks in Treatment: 0 Active Problems ICD-10 Encounter Code Description Active Date MDM Diagnosis I87.2 Venous insufficiency (chronic) (peripheral) 02/06/2021 No Yes L97.822 Non-pressure chronic ulcer of other part of left lower leg with fat layer exposed6/04/2021 No Yes L97.822 Non-pressure chronic ulcer of other part of left lower leg with fat layer exposed6/04/2021 No Yes I10 Essential (primary) hypertension 02/06/2021 No Yes I48.0 Paroxysmal atrial fibrillation 02/06/2021 No Yes Z79.01 Long term (current) use of anticoagulants 02/06/2021 No Yes Inactive Problems Resolved Problems Electronic Signature(s) Signed: 02/06/2021 8:41:58 AM By: Lenda Kelp PA-C Entered By: Lenda Kelp on 02/06/2021 08:41:57 -------------------------------------------------------------------------------- Progress Note Details Patient Name: Date of Service: [facility] [name] 02/06/2021 7:30 A M Medical Record Number: [id number] Patient Account Number: [id number] Date of Birth/Sex: Treating RN: 10/25/1940 (80 y.o. Female) Donna Simmons Primary Care Provider: SA GA RDIA, MIGUEL Other Clinician: Referring Provider: Treating Provider/Extender: Lenda Kelp SA GA RDIA, MIGUEL Weeks in Treatment: 0 Subjective Chief Complaint Information obtained from Patient Bilateral LE Ulcers History of Present Illness (HPI) 02/06/2021 upon evaluation today patient presents for initial evaluation here in clinic concerning issues he has been having with her legs. Fortunately there does not appear to be any signs of active infection at this time which is great news. She was given Keflex by the emergency department when she was initially seen here. With that being said the patient did not have any poor response to the Keflex she actually tolerated it quite  well and overall seems to be doing quite well at this time. No fevers, chills, nausea, vomiting, or diarrhea. It was on May 17 that she was given the Keflex. Currently they have been putting Neosporin on this followed by Kerlix and Coban. Her husband's been doing this at home. She is on Eliquis due to atrial fibrillation. Other than atrial fibrillation the patient also is on the long-term anticoagulant therapy with Eliquis secondary to this, has hypertension, and chronic venous insufficiency. She has been told she should wear compression stockings but is not able to get those on. Patient History Allergies chlorpheniramine Family History Unknown History. Social History Never smoker, Marital Status - Married, Alcohol Use - Never, Drug Use - No History, Caffeine Use - Rarely. Medical History Cardiovascular Patient has history of Arrhythmia - A-fib, Hypertension Musculoskeletal Patient has history of Osteoarthritis Neurologic  Patient has history of Dementia Medical A Surgical History Notes nd Cardiovascular Hyperlipidemia Musculoskeletal Degenerative disc disease Review of Systems (ROS) Constitutional Symptoms (General Health) Denies complaints or symptoms of Fatigue, Fever, Chills, Marked Weight Change. Eyes Denies complaints or symptoms of Dry Eyes, Vision Changes, Glasses / Contacts. Ear/Nose/Mouth/Throat Denies complaints or symptoms of Chronic sinus problems or rhinitis. Respiratory Denies complaints or symptoms of Chronic or frequent coughs, Shortness of Breath. Gastrointestinal Denies complaints or symptoms of Frequent diarrhea, Nausea, Vomiting. Endocrine Denies complaints or symptoms of Heat/cold intolerance. Genitourinary Denies complaints or symptoms of Frequent urination. Integumentary (Skin) Complains or has symptoms of Wounds - multiple wounds on BLE. Psychiatric Denies complaints or symptoms of Claustrophobia, Suicidal. Objective Constitutional patient is  hypertensive.. pulse regular and within target range for patient.Marland Kitchen respirations regular, non-labored and within target range for patient.Marland Kitchen temperature within target range for patient.. Well-nourished and well-hydrated in no acute distress. Vitals Time Taken: 7:54 AM, Temperature: 97.8 F, Pulse: 65 bpm, Respiratory Rate: 16 breaths/min, Blood Pressure: 167/84 mmHg. Eyes conjunctiva clear no eyelid edema noted. pupils equal round and reactive to light and accommodation. Ears, Nose, Mouth, and Throat no gross abnormality of ear auricles or external auditory canals. normal hearing noted during conversation. mucus membranes moist. Respiratory normal breathing without difficulty. Cardiovascular 1+ dorsalis pedis/posterior tibialis pulses. 2+ pitting edema of the bilateral lower extremities. Musculoskeletal normal gait and posture. no significant deformity or arthritic changes, no loss or range of motion, no clubbing. Psychiatric this patient is able to make decisions and demonstrates good insight into disease process. Alert and Oriented x 3. pleasant and cooperative. General Notes: Upon inspection patient's wounds again are showing signs of some dry and crusty drainage on the surface of the wound. They do not appear to be too deeply seated which is good news. Fortunately I think that this is something we will probably be able to heal quite effectively and quickly this is great news. Nonetheless I do believe that the patient is in general experiencing fairly good blood flow to her extremities which also should lend itself to much more appropriate healing at this point. She does however have edema that was noted today and I discussed with her that she really needs to have compression therapy probably long-term. With that being said I do believe that we will probably need to order Velcro compression wraps as this may be the only way that she is able to actually get a wrap on and achieve any success in  this regard. Patient voiced an understanding. Integumentary (Hair, Skin) Wound #1 status is Open. Original cause of wound was Blister. The date acquired was: 11/30/2020. The wound is located on the Right,Anterior Lower Leg. The wound measures 2cm length x 4.7cm width x 0.1cm depth; 7.383cm^2 area and 0.738cm^3 volume. There is Fat Layer (Subcutaneous Tissue) exposed. There is no tunneling or undermining noted. There is a medium amount of serosanguineous drainage noted. The wound margin is flat and intact. There is large (67- 100%) pink granulation within the wound bed. There is a small (1-33%) amount of necrotic tissue within the wound bed including Adherent Slough. Wound #2 status is Open. Original cause of wound was Blister. The date acquired was: 11/30/2020. The wound is located on the Right,Posterior Lower Leg. The wound measures 1cm length x 2.5cm width x 0.1cm depth; 1.963cm^2 area and 0.196cm^3 volume. There is Fat Layer (Subcutaneous Tissue) exposed. There is no tunneling or undermining noted. There is a medium amount of serosanguineous drainage noted. The wound  margin is flat and intact. There is large (67- 100%) pink granulation within the wound bed. There is no necrotic tissue within the wound bed. Wound #3 status is Open. Original cause of wound was Blister. The date acquired was: 11/30/2020. The wound is located on the Right,Proximal,Lateral Lower Leg. The wound measures 0.8cm length x 0.5cm width x 0.1cm depth; 0.314cm^2 area and 0.031cm^3 volume. There is Fat Layer (Subcutaneous Tissue) exposed. There is no tunneling or undermining noted. There is a medium amount of serosanguineous drainage noted. The wound margin is flat and intact. There is medium (34-66%) pink granulation within the wound bed. There is a medium (34-66%) amount of necrotic tissue within the wound bed including Adherent Slough. Wound #4 status is Open. Original cause of wound was Blister. The date acquired was: 11/30/2020. The  wound is located on the Right,Distal,Lateral Lower Leg. The wound measures 1cm length x 1cm width x 0.1cm depth; 0.785cm^2 area and 0.079cm^3 volume. There is Fat Layer (Subcutaneous Tissue) exposed. There is no tunneling or undermining noted. There is a medium amount of serosanguineous drainage noted. The wound margin is flat and intact. There is large (67-100%) pink granulation within the wound bed. There is no necrotic tissue within the wound bed. Wound #5 status is Open. Original cause of wound was Blister. The date acquired was: 11/30/2020. The wound is located on the Left,Medial,Anterior Lower Leg. The wound measures 0.6cm length x 0.9cm width x 0.1cm depth; 0.424cm^2 area and 0.042cm^3 volume. There is Fat Layer (Subcutaneous Tissue) exposed. There is no tunneling or undermining noted. There is a medium amount of serosanguineous drainage noted. The wound margin is flat and intact. There is large (67-100%) pink granulation within the wound bed. There is no necrotic tissue within the wound bed. Assessment Active Problems ICD-10 Venous insufficiency (chronic) (peripheral) Non-pressure chronic ulcer of other part of left lower leg with fat layer exposed Non-pressure chronic ulcer of other part of left lower leg with fat layer exposed Essential (primary) hypertension Paroxysmal atrial fibrillation Long term (current) use of anticoagulants Procedures Wound #1 Pre-procedure diagnosis of Wound #1 is a Venous Leg Ulcer located on the Right,Anterior Lower Leg . There was a Three Layer Compression Therapy Procedure by Fonnie Mu, RN. Post procedure Diagnosis Wound #1: Same as Pre-Procedure Wound #5 Pre-procedure diagnosis of Wound #5 is a Venous Leg Ulcer located on the Left,Medial,Anterior Lower Leg . There was a Three Layer Compression Therapy Procedure by Fonnie Mu, RN. Post procedure Diagnosis Wound #5: Same as Pre-Procedure Plan Follow-up Appointments: Return Appointment in  1 week. - with Luana Shu Shower/ Hygiene: May shower with protection but do not get wound dressing(s) wet. - use cast protector to to keep wraps dry in the shower shower Edema Control - Lymphedema / SCD / Other: Elevate legs to the level of the heart or above for 30 minutes daily and/or when sitting, a frequency of: - throughout the day whenever sitting Avoid standing for long periods of time. Exercise regularly WOUND #1: - Lower Leg Wound Laterality: Right, Anterior Peri-Wound Care: Sween Lotion (Moisturizing lotion) Discharge Instructions: Apply moisturizing lotion as directed Prim Dressing: KerraCel Ag Gelling Fiber Dressing, 4x5 in (silver alginate) ary Discharge Instructions: Apply silver alginate to wound bed as instructed Secondary Dressing: Woven Gauze Sponge, Non-Sterile 4x4 in Discharge Instructions: Apply over primary dressing as directed. Com pression Wrap: ThreePress (3 layer compression wrap) Discharge Instructions: Apply three layer compression as directed. WOUND #2: - Lower Leg Wound Laterality: Right, Posterior Peri-Wound Care: Sween  Lotion (Moisturizing lotion) Discharge Instructions: Apply moisturizing lotion as directed Prim Dressing: KerraCel Ag Gelling Fiber Dressing, 4x5 in (silver alginate) ary Discharge Instructions: Apply silver alginate to wound bed as instructed Secondary Dressing: Woven Gauze Sponge, Non-Sterile 4x4 in Discharge Instructions: Apply over primary dressing as directed. Com pression Wrap: ThreePress (3 layer compression wrap) Discharge Instructions: Apply three layer compression as directed. WOUND #3: - Lower Leg Wound Laterality: Right, Lateral, Proximal Peri-Wound Care: Sween Lotion (Moisturizing lotion) Discharge Instructions: Apply moisturizing lotion as directed Prim Dressing: KerraCel Ag Gelling Fiber Dressing, 4x5 in (silver alginate) ary Discharge Instructions: Apply silver alginate to wound bed as instructed Secondary Dressing:  Woven Gauze Sponge, Non-Sterile 4x4 in Discharge Instructions: Apply over primary dressing as directed. Com pression Wrap: ThreePress (3 layer compression wrap) Discharge Instructions: Apply three layer compression as directed. WOUND #4: - Lower Leg Wound Laterality: Right, Lateral, Distal Peri-Wound Care: Sween Lotion (Moisturizing lotion) Discharge Instructions: Apply moisturizing lotion as directed Prim Dressing: KerraCel Ag Gelling Fiber Dressing, 4x5 in (silver alginate) ary Discharge Instructions: Apply silver alginate to wound bed as instructed Secondary Dressing: Woven Gauze Sponge, Non-Sterile 4x4 in Discharge Instructions: Apply over primary dressing as directed. Com pression Wrap: ThreePress (3 layer compression wrap) Discharge Instructions: Apply three layer compression as directed. WOUND #5: - Lower Leg Wound Laterality: Left, Medial, Anterior Peri-Wound Care: Sween Lotion (Moisturizing lotion) Discharge Instructions: Apply moisturizing lotion as directed Prim Dressing: KerraCel Ag Gelling Fiber Dressing, 4x5 in (silver alginate) ary Discharge Instructions: Apply silver alginate to wound bed as instructed Secondary Dressing: Woven Gauze Sponge, Non-Sterile 4x4 in Discharge Instructions: Apply over primary dressing as directed. Com pression Wrap: ThreePress (3 layer compression wrap) Discharge Instructions: Apply three layer compression as directed. 1. I do recommend currently that we go ahead and initiate treatment with a silver alginate dressing I think this is can be a good option for the patient. 2. I am also can recommend at this time that we have the patient go ahead and initiate treatment with a continuation of the recommendation for compression right now we will get a go ahead and do a 3 layer compression wrap which I think will be appropriate. 3. I am also can recommend that we have the patient go ahead and utilize the elevation technique as far as her legs are  concerned I think the more she can keep this elevated the better. 4. With regard to shower she should use a cast protector which I think will be absolutely appropriate. We will see patient back for reevaluation in 1 week here in the clinic. If anything worsens or changes patient will contact our office for additional recommendations. Electronic Signature(s) Signed: 02/06/2021 1:09:02 PM By: Lenda Kelp PA-C Entered By: Lenda Kelp on 02/06/2021 13:09:02 -------------------------------------------------------------------------------- HxROS Details Patient Name: Date of Service: [facility] [place], [name] 02/06/2021 7:30 A M Medical Record Number: [id number] Patient Account Number: [id number] Date of Birth/Sex: Treating RN: 1941/03/27 (80 y.o. Female) [facility] Provider: SA GA RDIA, MIGUEL Other Clinician: Referring Provider: Treating Provider/Extender: Lenda Kelp SA GA RDIA, MIGUEL Weeks in Treatment: 0 Constitutional Symptoms (General Health) Complaints and Symptoms: Negative for: Fatigue; Fever; Chills; Marked Weight Change Eyes Complaints and Symptoms: Negative for: Dry Eyes; Vision Changes; Glasses / Contacts Ear/Nose/Mouth/Throat Complaints and Symptoms: Negative for: Chronic sinus problems or rhinitis Respiratory Complaints and Symptoms: Negative for: Chronic or frequent coughs; Shortness of Breath Gastrointestinal Complaints and Symptoms: Negative for: Frequent diarrhea; Nausea; Vomiting Endocrine Complaints and Symptoms:  Negative for: Heat/cold intolerance Genitourinary Complaints and Symptoms: Negative for: Frequent urination Integumentary (Skin) Complaints and Symptoms: Positive for: Wounds - multiple wounds on BLE Psychiatric Complaints and Symptoms: Negative for: Claustrophobia; Suicidal Hematologic/Lymphatic Cardiovascular Medical History: Positive for: Arrhythmia - A-fib; Hypertension Past Medical History  Notes: Hyperlipidemia Immunological Musculoskeletal Medical History: Positive for: Osteoarthritis Past Medical History Notes: Degenerative disc disease Neurologic Medical History: Positive for: Dementia Oncologic Immunizations Pneumococcal Vaccine: Received Pneumococcal Vaccination: Yes Implantable Devices None Family and Social History Unknown History: Yes; Never smoker; Marital Status - Married; Alcohol Use: Never; Drug Use: No History; Caffeine Use: Rarely; Financial Concerns: No; Food, Clothing or Shelter Needs: No; Support System Lacking: No; Transportation Concerns: No Electronic Signature(s) Signed: 02/06/2021 5:40:25 PM By: Lenda KelpStone III, Shakara Tweedy PA-C Signed: 02/06/2021 5:57:05 PM By: Zandra AbtsLynch, Shatara RN, BSN Entered By: Zandra AbtsLynch, Shatara on 02/06/2021 08:13:16 -------------------------------------------------------------------------------- SuperBill Details Patient Name: Date of Service: Orlinda BlalockWILLIA MS, [name] 02/06/2021 Medical Record Number: [id number] Patient Account Number: [id number] Date of Birth/Sex: Treating RN: 03/06/1941 [age] y.o. 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 Modifier: Quantity: 1 Physician Procedures : CPT4 Code Description Modifier [id number] WC PHYS LEVEL 3 NEW PT ICD-10 Diagnosis Description I87.2 Venous insufficiency (chronic) (peripheral) L97.822 Non-pressure chronic ulcer of other part of left lower leg with fat layer exposed I10 Essential  (primary) hypertension I48.0 Paroxysmal atrial fibrillation Quantity: 1 Electronic Signature(s) Signed: 02/06/2021 1:09:15 PM By: Lenda KelpStone III, Uchechi Denison PA-C Entered By: Lenda KelpStone III, Dareion Kneece on 02/06/2021 13:09:15

## 2021-02-06 NOTE — Progress Notes (Signed)
LATRECIA, CAPITO ([id number]) Visit Report for 02/06/2021 Allergy List Details Patient Name: Date of Service: [place], [place]. 02/06/2021 7:30 A M Medical Record Number: [id number] Patient Account Number: [id number] Date of Birth/Sex: Treating RN: November 23, 1940 (80 y.o. Female) Zandra Abts Primary Care Major Santerre: SA GA RDIA, MIGUEL Other Clinician: Referring Efrain Clauson: Treating Pattye Meda/Extender: Lenda Kelp SA GA RDIA, MIGUEL Weeks in Treatment: 0 Allergies Active Allergies chlorpheniramine Allergy Notes Electronic Signature(s) Signed: 02/06/2021 5:57:05 PM By: Zandra Abts RN, BSN Entered By: Zandra Abts on 02/06/2021 08:11:34 -------------------------------------------------------------------------------- Arrival Information Details Patient Name: Date of Service: [name], [name] 02/06/2021 7:30 A M Medical Record Number: [id number] Patient Account Number: [id number] Date of Birth/Sex: Treating RN: Mar 26, 1941 (80 y.o. Female) Zandra Abts Primary Care Kierstin January: SA GA RDIA, MIGUEL Other Clinician: Referring Rebeka Kimble: Treating Jowel Waltner/Extender: Lenda Kelp SA GA RDIA, MIGUEL Weeks in Treatment: 0 Visit Information Patient Arrived: Ambulatory Arrival Time: 07:54 Accompanied By: husband Transfer Assistance: None Patient Identification Verified: Yes Secondary Verification Process Completed: Yes Patient Requires Transmission-Based Precautions: No Patient Has Alerts: Yes Patient Alerts: Patient on Blood Thinner Electronic Signature(s) Signed: 02/06/2021 5:57:05 PM By: Zandra Abts RN, BSN Entered By: Zandra Abts on 02/06/2021 07:58:06 -------------------------------------------------------------------------------- Clinic Level of Care Assessment Details Patient Name: Date of Service: [facility] MS, [name] 02/06/2021 7:30 A M Medical Record Number: [id number] Patient Account Number: [id number] Date of Birth/Sex: Treating RN: 09-Nov-1940 (80 y.o. Female) Zenaida Deed Primary Care Shamyra Farias: SA GA RDIA, MIGUEL Other Clinician: Referring Edell Mesenbrink: Treating Leighanna Kirn/Extender: Lenda Kelp SA GA RDIA, MIGUEL Weeks in Treatment: 0 Clinic Level of Care Assessment Items TOOL 1 Quantity Score []  - 0 Use when EandM and Procedure is performed on INITIAL visit ASSESSMENTS - Nursing Assessment / Reassessment X- 1 20 General Physical Exam (combine w/ comprehensive assessment (listed just below) when performed on new pt. evals) X- 1 25 Comprehensive Assessment (HX, ROS, Risk Assessments, Wounds Hx, etc.) ASSESSMENTS - Wound and Skin Assessment / Reassessment []  - 0 Dermatologic / Skin Assessment (not related to wound area) ASSESSMENTS - Ostomy and/or Continence Assessment and Care []  - 0 Incontinence Assessment and Management []  - 0 Ostomy Care Assessment and Management (repouching, etc.) PROCESS - Coordination of Care X - Simple Patient / Family Education for ongoing care 1 15 []  - 0 Complex (extensive) Patient / Family Education for ongoing care X- 1 10 Staff obtains , Records, T Results / Process Orders est []  - 0 Staff telephones HHA, Nursing Homes / Clarify orders / etc []  - 0 Routine Transfer to another Facility (non-emergent condition) []  - 0 Routine Hospital Admission (non-emergent condition) X- 1 15 New Admissions / / Ordering NPWT Apligraf, etc. , []  - 0 Emergency Hospital Admission (emergent condition) PROCESS - Special Needs []  - 0 Pediatric / Minor Patient Management []  - 0 Isolation Patient Management []  - 0 Hearing / Language / Visual special needs []  - 0 Assessment of Community assistance (transportation, D/C planning, etc.) []  - 0 Additional assistance / Altered mentation []  - 0 Support Surface(s) Assessment (bed, cushion, seat, etc.) INTERVENTIONS - Miscellaneous []  - 0 External ear exam []  - 0 Patient Transfer (multiple staff / / Similar devices) []  - 0 Simple  Staple / Suture removal (25 or less) []  - 0 Complex Staple / Suture removal (26 or more) []  - 0 Hypo/Hyperglycemic Management (do not check if billed separately) X- 1 15 Ankle / Brachial Index (ABI) - do not check if  billed separately Has the patient been seen at the hospital within the last three years: Yes Total Score: 100 Level Of Care: New/Established - Level 3 Electronic Signature(s) Signed: 02/06/2021 6:12:56 PM By: Zenaida Deed RN, BSN Entered By: Zenaida Deed on 02/06/2021 08:48:59 -------------------------------------------------------------------------------- Compression Therapy Details Patient Name: Date of Service: [name], [name] 02/06/2021 7:30 A M Medical Record Number: [id number] Patient Account Number: [id number] Date of Birth/Sex: Treating RN: 1940-11-17 (80 y.o. Female) Zenaida Deed Primary Care Teejay Meader: SA GA RDIA, MIGUEL Other Clinician: Referring Pj Zehner: Treating Roseanna Koplin/Extender: Lenda Kelp SA GA RDIA, MIGUEL Weeks in Treatment: 0 Compression Therapy Performed for Wound Assessment: Wound #1 Right,Anterior Lower Leg Performed By: Clinician Fonnie Mu, RN Compression Type: Three Layer Post Procedure Diagnosis Same as Pre-procedure Electronic Signature(s) Signed: 02/06/2021 6:12:56 PM By: Zenaida Deed RN, BSN Entered By: Zenaida Deed on 02/06/2021 08:49:45 -------------------------------------------------------------------------------- Compression Therapy Details Patient Name: Date of Service: [name], [name] 02/06/2021 7:30 A M Medical Record Number: [id number] Patient Account Number: [id number] Date of Birth/Sex: Treating RN: 1941-07-26 (80 y.o. Female) Zenaida Deed Primary Care Damiah Mcdonald: SA GA RDIA, MIGUEL Other Clinician: Referring Jamiya Nims: Treating Lendell Gallick/Extender: Lenda Kelp SA GA RDIA, MIGUEL Weeks in Treatment: 0 Compression Therapy Performed for Wound Assessment: Wound #5 Left,Medial,Anterior Lower  Leg Performed By: Clinician Fonnie Mu, RN Compression Type: Three Layer Post Procedure Diagnosis Same as Pre-procedure Electronic Signature(s) Signed: 02/06/2021 6:12:56 PM By: Zenaida Deed RN, BSN Entered By: Zenaida Deed on 02/06/2021 08:49:45 -------------------------------------------------------------------------------- Encounter Discharge Information Details Patient Name: Date of Service: [name], [name] 02/06/2021 7:30 A M Medical Record Number: [id number] Patient Account Number: [id number] Date of Birth/Sex: Treating RN: 1940/09/16 (80 y.o. Female) Fonnie Mu Primary Care Elmer Merwin: SA GA RDIA, MIGUEL Other Clinician: Referring Chantry Headen: Treating Merryn Thaker/Extender: Lenda Kelp SA GA RDIA, MIGUEL Weeks in Treatment: 0 Encounter Discharge Information Items Discharge Condition: Stable Ambulatory Status: Ambulatory Discharge Destination: Home Transportation: Private Auto Accompanied By: self Schedule Follow-up Appointment: Yes Clinical Summary of Care: Patient Declined Electronic Signature(s) Signed: 02/06/2021 6:04:38 PM By: Fonnie Mu RN Entered By: Fonnie Mu on 02/06/2021 08:55:46 -------------------------------------------------------------------------------- Lower Extremity Assessment Details Patient Name: Date of Service: [name], [name] 02/06/2021 7:30 A M Medical Record Number: [id number] Patient Account Number: [id number] Date of Birth/Sex: Treating RN: [birth year] (80 y.o. Female) Zandra Abts Primary Care Zeriyah Wain: SA GA RDIA, MIGUEL Other Clinician: Referring Raybon Conard: Treating Adelheid Hoggard/Extender: Lenda Kelp SA GA RDIA, MIGUEL Weeks in Treatment: 0 Edema Assessment Assessed: [Left: No] [Right: No] Edema: [Left: Yes] [Right: Yes] Calf Left: Right: Point of Measurement: 31 cm From Medial Instep 40.5 cm 43.5 cm Ankle Left: Right: Point of Measurement: 11 cm From Medial Instep 28 cm 27.5 cm Vascular  Assessment Blood Pressure: Brachial: [Left:167] [Right:167] Ankle: [Left:Dorsalis Pedis: 190 1.14] [Right:Dorsalis Pedis: 200 1.20] Electronic Signature(s) Signed: 02/06/2021 5:57:05 PM By: Zandra Abts RN, BSN Entered By: Zandra Abts on 02/06/2021 08:21:04 -------------------------------------------------------------------------------- Multi-Disciplinary Care Plan Details Patient Name: Date of Service: [name], [name] 02/06/2021 7:30 A M Medical Record Number: [id number] Patient Account Number: [id number] Date of Birth/Sex: Treating RN: [birth year] (80 y.o. Female) Zenaida Deed Primary Care Zitlaly Malson: SA GA RDIA, MIGUEL Other Clinician: Referring Dhilan Brauer: Treating Norvel Wenker/Extender: Lenda Kelp SA GA RDIA, MIGUEL Weeks in Treatment: 0 Multidisciplinary Care Plan reviewed with physician Active Inactive Abuse / Safety / Falls / Self Care Management Nursing Diagnoses: Potential for falls Goals: Patient/caregiver will verbalize/demonstrate measures taken to prevent injury and/or falls Date  Initiated: 02/06/2021 Target Resolution Date: 03/06/2021 Goal Status: Active Interventions: Assess fall risk on admission and as needed Assess impairment of mobility on admission and as needed per policy Notes: Venous Leg Ulcer Nursing Diagnoses: Knowledge deficit related to disease process and management Potential for venous Insuffiency (use before diagnosis confirmed) Goals: Patient will maintain optimal edema control Date Initiated: 02/06/2021 Target Resolution Date: 03/06/2021 Goal Status: Active Interventions: Assess peripheral edema status every visit. Compression as ordered Provide education on venous insufficiency Treatment Activities: Therapeutic compression applied : 02/06/2021 Notes: Wound/Skin Impairment Nursing Diagnoses: Impaired tissue integrity Knowledge deficit related to ulceration/compromised skin integrity Goals: Patient/caregiver will verbalize  understanding of skin care regimen Date Initiated: 02/06/2021 Target Resolution Date: 03/06/2021 Goal Status: Active Ulcer/skin breakdown will have a volume reduction of 30% by week 4 Date Initiated: 02/06/2021 Target Resolution Date: 03/06/2021 Goal Status: Active Interventions: Assess patient/caregiver ability to obtain necessary supplies Assess patient/caregiver ability to perform ulcer/skin care regimen upon admission and as needed Assess ulceration(s) every visit Provide education on ulcer and skin care Treatment Activities: Skin care regimen initiated : 02/06/2021 Topical wound management initiated : 02/06/2021 Notes: Electronic Signature(s) Signed: 02/06/2021 6:12:56 PM By: Zenaida Deed RN, BSN Entered By: Zenaida Deed on 02/06/2021 08:47:16 -------------------------------------------------------------------------------- Pain Assessment Details Patient Name: Date of Service: [name], [name] 02/06/2021 7:30 A M Medical Record Number: [id number] Patient Account Number: [id number] Date of Birth/Sex: Treating RN: [birth year] (80 y.o. Female) Zandra Abts Primary Care Jaydian Santana: SA GA RDIA, MIGUEL Other Clinician: Referring Dee Maday: Treating Alontae Chaloux/Extender: Lenda Kelp SA GA RDIA, MIGUEL Weeks in Treatment: 0 Active Problems Location of Pain Severity and Description of Pain Patient Has Paino No Site Locations Pain Management and Medication Current Pain Management: Electronic Signature(s) Signed: 02/06/2021 5:57:05 PM By: Zandra Abts RN, BSN Entered By: Zandra Abts on 02/06/2021 08:26:22 -------------------------------------------------------------------------------- Patient/Caregiver Education Details Patient Name: Date of Service: [name], MA RY Demetrius Charity 6/8/2022andnbsp7:30 A M Medical Record Number: [id number] Patient Account Number: [id number] Date of Birth/Gender: Treating RN: 18-Jan-1941 (80 y.o. Female) Zenaida Deed Primary Care Physician: SA GA RDIA,  MIGUEL Other Clinician: Referring Physician: Treating Physician/Extender: Lenda Kelp SA GA RDIA, MIGUEL Weeks in Treatment: 0 Education Assessment Education Provided To: Patient Education Topics Provided Venous: Handouts: Controlling Swelling with Compression Stockings , Managing Venous Disease and Related Ulcers Methods: Explain/Verbal, Printed Responses: Reinforcements needed, State content correctly Welcome [facility]: o Handouts: Welcome [facility] o Methods: Explain/Verbal, Printed Responses: Reinforcements needed, State content correctly Electronic Signature(s) Signed: 02/06/2021 6:12:56 PM By: Zenaida Deed RN, BSN Entered By: Zenaida Deed on 02/06/2021 08:47:50 -------------------------------------------------------------------------------- Wound Assessment Details Patient Name: Date of Service: [name], [name] 02/06/2021 7:30 A M Medical Record Number: [id number] Patient Account Number: [id number] Date of Birth/Sex: Treating RN: February 03, 1941 (80 y.o. Female) Zandra Abts Primary Care Azar South: SA GA RDIA, MIGUEL Other Clinician: Referring Ellise Kovack: Treating Trenda Corliss/Extender: Lenda Kelp SA GA RDIA, MIGUEL Weeks in Treatment: 0 Wound Status Wound Number: 1 Primary Etiology: Venous Leg Ulcer Wound Location: Right, Anterior Lower Leg Wound Status: Open Wounding Event: Blister Comorbid History: Arrhythmia, Hypertension, Osteoarthritis, Dementia Date Acquired: 11/30/2020 Weeks Of Treatment: 0 Clustered Wound: Yes Photos Wound Measurements Length: (cm) 2 Width: (cm) 4.7 Depth: (cm) 0.1 Clustered Quantity: 3 Area: (cm) 7.383 Volume: (cm) 0.738 % Reduction in Area: 0% % Reduction in Volume: 0% Epithelialization: None Tunneling: No Undermining: No Wound Description Classification: Full Thickness Without Exposed Support Structures Wound Margin: Flat and Intact Exudate  Amount: Medium Exudate Type:  Serosanguineous Exudate Color: red, brown Foul Odor After Cleansing: No Slough/Fibrino Yes Wound Bed Granulation Amount: Large (67-100%) Exposed Structure Granulation Quality: Pink Fascia Exposed: No Necrotic Amount: Small (1-33%) Fat Layer (Subcutaneous Tissue) Exposed: Yes Necrotic Quality: Adherent Slough Tendon Exposed: No Muscle Exposed: No Joint Exposed: No Bone Exposed: No Treatment Notes Wound #1 (Lower Leg) Wound Laterality: Right, Anterior Cleanser Peri-Wound Care Sween Lotion (Moisturizing lotion) Discharge Instruction: Apply moisturizing lotion as directed Topical Primary Dressing KerraCel Ag Gelling Fiber Dressing, 4x5 in (silver alginate) Discharge Instruction: Apply silver alginate to wound bed as instructed Secondary Dressing Woven Gauze Sponge, Non-Sterile 4x4 in Discharge Instruction: Apply over primary dressing as directed. Secured With Compression Wrap ThreePress (3 layer compression wrap) Discharge Instruction: Apply three layer compression as directed. Compression Stockings Add-Ons Electronic Signature(s) Signed: 02/06/2021 5:19:11 PM By: Karl Ito Signed: 02/06/2021 5:57:05 PM By: Zandra Abts RN, BSN Entered By: Karl Ito on 02/06/2021 16:40:11 -------------------------------------------------------------------------------- Wound Assessment Details Patient Name: Date of Service: [name], [name] 02/06/2021 7:30 A M Medical Record Number: [id number] Patient Account Number: [id number] Date of Birth/Sex: Treating RN: May 27, 1941 (80 y.o. Female) Zandra Abts Primary Care Sonal Dorwart: SA GA RDIA, MIGUEL Other Clinician: Referring Silvie Obremski: Treating Destaney Sarkis/Extender: Lenda Kelp SA GA RDIA, MIGUEL Weeks in Treatment: 0 Wound Status Wound Number: 2 Primary Etiology: Venous Leg Ulcer Wound Location: Right, Posterior Lower Leg Wound Status: Open Wounding Event: Blister Comorbid History: Arrhythmia, Hypertension, Osteoarthritis,  Dementia Date Acquired: 11/30/2020 Weeks Of Treatment: 0 Clustered Wound: No Photos Wound Measurements Length: (cm) 1 Width: (cm) 2.5 Depth: (cm) 0.1 Area: (cm) 1.963 Volume: (cm) 0.196 % Reduction in Area: 0% % Reduction in Volume: 0% Epithelialization: None Tunneling: No Undermining: No Wound Description Classification: Full Thickness Without Exposed Support Structures Wound Margin: Flat and Intact Exudate Amount: Medium Exudate Type: Serosanguineous Exudate Color: red, brown Foul Odor After Cleansing: No Slough/Fibrino No Wound Bed Granulation Amount: Large (67-100%) Exposed Structure Granulation Quality: Pink Fascia Exposed: No Necrotic Amount: None Present (0%) Fat Layer (Subcutaneous Tissue) Exposed: Yes Tendon Exposed: No Muscle Exposed: No Joint Exposed: No Bone Exposed: No Treatment Notes Wound #2 (Lower Leg) Wound Laterality: Right, Posterior Cleanser Peri-Wound Care Sween Lotion (Moisturizing lotion) Discharge Instruction: Apply moisturizing lotion as directed Topical Primary Dressing KerraCel Ag Gelling Fiber Dressing, 4x5 in (silver alginate) Discharge Instruction: Apply silver alginate to wound bed as instructed Secondary Dressing Woven Gauze Sponge, Non-Sterile 4x4 in Discharge Instruction: Apply over primary dressing as directed. Secured With Compression Wrap ThreePress (3 layer compression wrap) Discharge Instruction: Apply three layer compression as directed. Compression Stockings Add-Ons Electronic Signature(s) Signed: 02/06/2021 5:19:11 PM By: Karl Ito Signed: 02/06/2021 5:57:05 PM By: Zandra Abts RN, BSN Entered By: Karl Ito on 02/06/2021 16:40:33 -------------------------------------------------------------------------------- Wound Assessment Details Patient Name: Date of Service: [name], [name] 02/06/2021 7:30 A M Medical Record Number: [id number] Patient Account Number: [id number] Date of Birth/Sex: Treating  RN: 05/24/1941 (80 y.o. Female) Zandra Abts Primary Care Tucker Minter: SA GA RDIA, MIGUEL Other Clinician: Referring Pola Furno: Treating Mccoy Testa/Extender: Lenda Kelp SA GA RDIA, MIGUEL Weeks in Treatment: 0 Wound Status Wound Number: 3 Primary Etiology: Venous Leg Ulcer Wound Location: Right, Proximal, Lateral Lower Leg Wound Status: Open Wounding Event: Blister Comorbid History: Arrhythmia, Hypertension, Osteoarthritis, Dementia Date Acquired: 11/30/2020 Weeks Of Treatment: 0 Clustered Wound: No Photos Wound Measurements Length: (cm) 0.8 Width: (cm) 0.5 Depth: (cm) 0.1 Area: (cm) 0.314 Volume: (cm) 0.031 % Reduction in Area: 0% % Reduction in  Volume: 0% Epithelialization: None Tunneling: No Undermining: No Wound Description Classification: Full Thickness Without Exposed Support Structu Wound Margin: Flat and Intact Exudate Amount: Medium Exudate Type: Serosanguineous Exudate Color: red, brown res Foul Odor After Cleansing: No Slough/Fibrino Yes Wound Bed Granulation Amount: Medium (34-66%) Exposed Structure Granulation Quality: Pink Fascia Exposed: No Necrotic Amount: Medium (34-66%) Fat Layer (Subcutaneous Tissue) Exposed: Yes Necrotic Quality: Adherent Slough Tendon Exposed: No Muscle Exposed: No Joint Exposed: No Bone Exposed: No Treatment Notes Wound #3 (Lower Leg) Wound Laterality: Right, Lateral, Proximal Cleanser Peri-Wound Care Sween Lotion (Moisturizing lotion) Discharge Instruction: Apply moisturizing lotion as directed Topical Primary Dressing KerraCel Ag Gelling Fiber Dressing, 4x5 in (silver alginate) Discharge Instruction: Apply silver alginate to wound bed as instructed Secondary Dressing Woven Gauze Sponge, Non-Sterile 4x4 in Discharge Instruction: Apply over primary dressing as directed. Secured With Compression Wrap ThreePress (3 layer compression wrap) Discharge Instruction: Apply three layer compression as  directed. Compression Stockings Add-Ons Electronic Signature(s) Signed: 02/06/2021 5:19:11 PM By: Karl Itoawkins, Destiny Signed: 02/06/2021 5:57:05 PM By: Zandra AbtsLynch, Shatara RN, BSN Entered By: Karl Itoawkins, Destiny on 02/06/2021 16:41:01 -------------------------------------------------------------------------------- Wound Assessment Details Patient Name: Date of Service: Orlinda BlalockWILLIA MS, [name] 02/06/2021 7:30 A M Medical Record Number: [id number] Patient Account Number: [id number] Date of Birth/Sex: Treating RN: Jan 21, 1941 2(79 y.o. Female) Zandra AbtsLynch, Shatara Primary Care Kehinde Totzke: SA GA RDIA, MIGUEL Other Clinician: Referring Janely Gullickson: Treating Kyzen Horn/Extender: Lenda KelpStone III, Hoyt SA GA RDIA, MIGUEL Weeks in Treatment: 0 Wound Status Wound Number: 4 Primary Etiology: Venous Leg Ulcer Wound Location: Right, Distal, Lateral Lower Leg Wound Status: Open Wounding Event: Blister Comorbid History: Arrhythmia, Hypertension, Osteoarthritis, Dementia Date Acquired: 11/30/2020 Weeks Of Treatment: 0 Clustered Wound: No Photos Wound Measurements Length: (cm) 1 Width: (cm) 1 Depth: (cm) 0.1 Area: (cm) 0.785 Volume: (cm) 0.079 % Reduction in Area: 0% % Reduction in Volume: 0% Epithelialization: None Tunneling: No Undermining: No Wound Description Classification: Full Thickness Without Exposed Support Structures Wound Margin: Flat and Intact Exudate Amount: Medium Exudate Type: Serosanguineous Exudate Color: red, brown Foul Odor After Cleansing: No Slough/Fibrino No Wound Bed Granulation Amount: Large (67-100%) Exposed Structure Granulation Quality: Pink Fascia Exposed: No Necrotic Amount: None Present (0%) Fat Layer (Subcutaneous Tissue) Exposed: Yes Tendon Exposed: No Muscle Exposed: No Joint Exposed: No Bone Exposed: No Treatment Notes Wound #4 (Lower Leg) Wound Laterality: Right, Lateral, Distal Cleanser Peri-Wound Care Sween Lotion (Moisturizing lotion) Discharge Instruction: Apply  moisturizing lotion as directed Topical Primary Dressing KerraCel Ag Gelling Fiber Dressing, 4x5 in (silver alginate) Discharge Instruction: Apply silver alginate to wound bed as instructed Secondary Dressing Woven Gauze Sponge, Non-Sterile 4x4 in Discharge Instruction: Apply over primary dressing as directed. Secured With Compression Wrap ThreePress (3 layer compression wrap) Discharge Instruction: Apply three layer compression as directed. Compression Stockings Add-Ons Electronic Signature(s) Signed: 02/06/2021 5:19:11 PM By: Karl Itoawkins, Destiny Signed: 02/06/2021 5:57:05 PM By: Zandra AbtsLynch, Shatara RN, BSN Entered By: Karl Itoawkins, Destiny on 02/06/2021 16:41:26 -------------------------------------------------------------------------------- Wound Assessment Details Patient Name: Date of Service: Orlinda BlalockWILLIA MS, [name] 02/06/2021 7:30 A M Medical Record Number: [id number] Patient Account Number: [id number] Date of Birth/Sex: Treating RN: Jan 21, 1941 [age] y.o. Female) Zandra AbtsLynch, Shatara Primary Care Malike Foglio: SA GA RDIA, MIGUEL Other Clinician: Referring Nhyira Leano: Treating Winfrey Chillemi/Extender: Lenda KelpStone III, Hoyt SA GA RDIA, MIGUEL Weeks in Treatment: 0 Wound Status Wound Number: 5 Primary Etiology: Venous Leg Ulcer Wound Location: Left, Medial, Anterior Lower Leg Wound Status: Open Wounding Event: Blister Comorbid History: Arrhythmia, Hypertension, Osteoarthritis, Dementia Date Acquired: 11/30/2020 Weeks Of Treatment: 0 Clustered Wound: No  Photos Wound Measurements Length: (cm) 0.6 Width: (cm) 0.9 Depth: (cm) 0.1 Area: (cm) 0.424 Volume: (cm) 0.042 % Reduction in Area: 0% % Reduction in Volume: 0% Epithelialization: None Tunneling: No Undermining: No Wound Description Classification: Full Thickness Without Exposed Support Structures Wound Margin: Flat and Intact Exudate Amount: Medium Exudate Type: Serosanguineous Exudate Color: red, brown Foul Odor After Cleansing: No Slough/Fibrino  No Wound Bed Granulation Amount: Large (67-100%) Exposed Structure Granulation Quality: Pink Fascia Exposed: No Necrotic Amount: None Present (0%) Fat Layer (Subcutaneous Tissue) Exposed: Yes Tendon Exposed: No Muscle Exposed: No Joint Exposed: No Bone Exposed: No Treatment Notes Wound #5 (Lower Leg) Wound Laterality: Left, Medial, Anterior Cleanser Peri-Wound Care Sween Lotion (Moisturizing lotion) Discharge Instruction: Apply moisturizing lotion as directed Topical Primary Dressing KerraCel Ag Gelling Fiber Dressing, 4x5 in (silver alginate) Discharge Instruction: Apply silver alginate to wound bed as instructed Secondary Dressing Woven Gauze Sponge, Non-Sterile 4x4 in Discharge Instruction: Apply over primary dressing as directed. Secured With Compression Wrap ThreePress (3 layer compression wrap) Discharge Instruction: Apply three layer compression as directed. Compression Stockings Add-Ons Electronic Signature(s) Signed: 02/06/2021 5:19:11 PM By: Karl Ito Signed: 02/06/2021 5:57:05 PM By: Zandra Abts RN, BSN Entered By: Karl Ito on 02/06/2021 16:41:53 -------------------------------------------------------------------------------- Vitals Details Patient Name: Date of Service: [name], [name] 02/06/2021 7:30 A M Medical Record Number: [id number] Patient Account Number: [id number] Date of Birth/Sex: Treating RN: 05-11-1941 (80 y.o. Female) Zandra Abts Primary Care Valda Christenson: SA GA RDIA, MIGUEL Other Clinician: Referring Enoc Getter: Treating Arisbeth Purrington/Extender: Lenda Kelp SA GA RDIA, MIGUEL Weeks in Treatment: 0 Vital Signs Time Taken: 07:54 Temperature (F): 97.8 Pulse (bpm): 65 Respiratory Rate (breaths/min): 16 Blood Pressure (mmHg): 167/84 Reference Range: 80 - 120 mg / dl Electronic Signature(s) Signed: 02/06/2021 5:57:05 PM By: Zandra Abts RN, BSN Entered By: Zandra Abts on 02/06/2021 08:10:51

## 2021-02-07 NOTE — Progress Notes (Signed)
Cardiology Office Note   Date:  02/08/2021   ID:  Donna Simmons November 10, 1940, MRN [id number]  PCP:  Georgina Quint, MD  Cardiologist:  Dr. Herbie Baltimore    History of Present Illness: Donna Simmons is a 80 y.o. female who presents for ongoing assessment and management of PAF, hyperlipidemia, and hypertension.  She was last seen by Dr. Herbie Baltimore on 08/07/2020.  She does have some memory issues and is normally accompanied by family member.  Her EKG at that time revealed normal sinus rhythm.  She remains on flecainide and metoprolol along with Eliquis.  She is to take short acting metoprolol 50 mg if she has breakthrough atrial fibrillation.  Blood pressure was well controlled with no changes in her treatment regimen.  She was seen in the emergency room on 01/15/2021 for lower extremity edema and open wounds on both lower legs just above the ankles.  Wounds were draining.  She was diagnosed with lower extremity cellulitis.  Wounds were treated and she had a soft wrap dressing applied.  She was referred to wound care specialist.  Question on need to continue Lasix was deferred to cardiology.  She was sent home on Lasix 20 mg daily for 5 days with cardiology recommendations to continue.  She was started on Keflex 500 mg 3 times daily for 10 days.  She comes today with her son and offers no complaints of chest pain, shortness of breath, or dizziness.  Her legs are wrapped and she is followed very closely by the wound center.  She is compliant with her antibiotics.  Past Medical History:  Diagnosis Date   Allergy    GERD (gastroesophageal reflux disease)    Heart murmur    No significant valvular lesion noted on echo.   Hypercholesteremia    Hypertension    Memory loss    Paroxysmal atrial fibrillation (HCC) 12/27/2008    Past Surgical History:  Procedure Laterality Date   ABDOMINAL HYSTERECTOMY  1980   BREAST SURGERY     CT CTA CORONARY W/CA SCORE W/CM &/OR WO/CM  08/2018   Coronary  calcium score 24.  Nonobstructive CAD with less than 30% plaque in proximal LAD.  Normal aortic root. ->  Relook in October 2020 had significant motion artifact but coronary calcium score 26.   EYE SURGERY Right 12/29/2016   EYE SURGERY Left 01/19/2017   FRACTURE SURGERY     LEFT HEART CATH AND CORONARY ANGIOGRAPHY  12/16/2010   no evidence of CAD to explain anginal pain w/ positive troponin.  potential etiology is breakthrough AF   NM MYOVIEW LTD  April 2010   EF 64%, normal pattern of perfusion in all regions, no scintigraphic evidence of inducible ischemia; no significant wall motion abnormalities; EKG negative for ischemia; no significant change from last study; low risk scan   TRANSTHORACIC ECHOCARDIOGRAM  07/2018   Normal EF 55-60%.  No RWMA. GR 1 DD. Ao Sclerosis - no Stenosis. Mild AI. Trivial MR.      Current Outpatient Medications  Medication Sig Dispense Refill   acetaminophen (TYLENOL) 325 MG tablet Take 325-650 mg by mouth daily as needed (pain or headaches).      amLODipine (NORVASC) 10 MG tablet Take 10 mg by mouth daily.     Blood Pressure Monitoring (BLOOD PRESSURE CUFF) MISC Take your  blood pressure with blood pressure cuff/moniotor- can be automatic or manual - one to three  times a week as needed 1 each 0   ELIQUIS 5  MG TABS tablet Take 1 tablet by mouth twice daily 180 tablet 1   ENSURE (ENSURE) Take 237 mLs by mouth daily.     flecainide (TAMBOCOR) 50 MG tablet TAKE 1 TABLET BY MOUTH TWICE DAILY . APPOINTMENT REQUIRED FOR FUTURE REFILLS 180 tablet 2   fluticasone (FLONASE) 50 MCG/ACT nasal spray USE TWO SPRAY(S) IN EACH NOSTRIL ONCE DAILY (Patient taking differently: Place 1 spray into both nostrils daily.) 16 g 1   furosemide (LASIX) 20 MG tablet Take 1 tablet (20 mg total) by mouth daily. Take daily for 5 days then continue medication per Primary Care provider guidance. 30 tablet 0   lisinopril (ZESTRIL) 20 MG tablet Take 1 tablet by mouth once daily 90 tablet 3    memantine (NAMENDA) 5 MG tablet Take 1 tablet daily for one week, then take 1 tablet twice daily for one week, then take 1 tablet in the morning and 2 in the evening for one week, then take 2 tablets twice daily 70 tablet 0   metoprolol succinate (TOPROL-XL) 50 MG 24 hr tablet TAKE 1 TABLET BY MOUTH ONCE DAILY WITH OR IMMEDIATELY FOLLOWING A MEAL 90 tablet 1   metoprolol tartrate (LOPRESSOR) 50 MG tablet Take 1 tablet only with flecainide as needed for rapid fast heart rate 10 tablet 6   Multiple Vitamins-Minerals (CENTRUM SILVER 50+WOMEN) TABS Take 1 tablet by mouth daily with lunch.     MYRBETRIQ 25 MG TB24 tablet Take 25 mg by mouth daily.     omeprazole (PRILOSEC) 40 MG capsule      omeprazole (PRILOSEC) 40 MG capsule 1 cap(s)     rosuvastatin (CRESTOR) 20 MG tablet Take 1 tablet by mouth once daily 90 tablet 3   sucralfate (CARAFATE) 1 GM/10ML suspension Take 10 mLs (1 g total) by mouth 4 (four) times daily -  with meals and at bedtime. 420 mL 0   No current facility-administered medications for this visit.    Allergies:   Antihistamines, chlorpheniramine-type    Social History:  The patient  reports that she has never smoked. She has never used smokeless tobacco. She reports that she does not drink alcohol and does not use drugs.   Family History:  The patient's family history includes Arthritis in her mother; Cancer in her maternal grandmother; Cancer - Lung in her brother and brother; Cancer - Prostate in her brother; Dementia in her mother and sister; Heart disease in her father and maternal grandfather; Hypertension in her brother, mother, and sister.    ROS: All other systems are reviewed and negative. Unless otherwise mentioned in H&P    PHYSICAL EXAM: VS:  BP (!) 148/94   Pulse 69   Ht 5' (1.524 m)   Wt 127 lb (57.6 kg)   SpO2 98%   BMI 24.80 kg/m  , BMI Body mass index is 24.8 kg/m. GEN: Well nourished, well developed, in no acute distress HEENT: normal Neck: no JVD,  carotid bruits, or masses Cardiac: RRR; no murmurs, rubs, or gallops,no edema  Respiratory:  Clear to auscultation bilaterally, normal work of breathing GI: soft, nontender, nondistended, + BS MS: Bilateral lower extremities below the knee are wrapped in gauze and netting.  No edema noted in her feet or toes. Skin: warm and dry, no rash Neuro: Diminished strength, sensation are intact-hard of hearing Psych: euthymic mood, full affect   EKG: Not completed this office visit  Recent Labs: 04/04/2020: TSH 1.160 10/22/2020: ALT 22; BUN 22; Creatinine, Ser 0.89; Hemoglobin 11.6; Platelets  183; Potassium 4.5; Sodium 141    Lipid Panel    Component Value Date/Time   CHOL 157 04/04/2020 1132   TRIG 60 04/04/2020 1132   HDL 71 04/04/2020 1132   CHOLHDL 2.2 04/04/2020 1132   CHOLHDL 2.7 08/29/2015 0910   VLDL 20 08/29/2015 0910   LDLCALC 74 04/04/2020 1132      Wt Readings from Last 3 Encounters:  02/08/21 127 lb (57.6 kg)  11/05/20 126 lb 6.4 oz (57.3 kg)  09/22/20 119 lb 14.9 oz (54.4 kg)      Other studies Reviewed: Echocardiogram August 12, 2018  Left ventricle: The cavity size was normal. Wall thickness was    normal. Systolic function was normal. The estimated ejection    fraction was in the range of 55% to 60%. Wall motion was normal;    there were no regional wall motion abnormalities. Doppler    parameters are consistent with abnormal left ventricular    relaxation (grade 1 diastolic dysfunction). The E/e&' ratio is    >15, suggesting elevated LV filling pressure.  - Aortic valve: Trileaflet. Sclerosis without stenosis. There was    mild regurgitation.  - Mitral valve: Mildly thickened leaflets . There was trivial    regurgitation.  - Left atrium: The atrium was normal in size.  - Tricuspid valve: There was trivial regurgitation.  - Pulmonary arteries: PA peak pressure: 19 mm Hg (S).  - Inferior vena cava: The vessel was normal in size. The    respirophasic diameter  changes were in the normal range (>= 50%),    consistent with normal central venous pressure.   ASSESSMENT AND PLAN:  1.  Paroxysmal atrial fibrillation: Currently in regular rhythm with good rate control.  She is compliant with medications and anticoagulation therapy.  No changes in her regimen at this time, continue metoprolol, flecainide, and Eliquis.  2.  Hypertension: Blood pressures currently elevated better at home is better.  If she is persistently elevated on next office visit may need to consider adjustments in medication.  She was to continue Lasix as directed at 20 mg daily.  3.  Cellulitis of the lower extremities bilaterally: Followed by wound care center.  She states that they are responding well to treatment.   Current medicines are reviewed at length with the patient today.  I have spent 25 mis dedicated to the care of this patient on the date of this encounter to include pre-visit review of records, assessment, management and diagnostic testing,with shared decision making.  Labs/ tests ordered today include: None  Bettey Mare. Liborio Nixon, ANP, AACC  25 02/08/2021 1:02 PM    [facility] [address] Office [phone] Fax [phone]  Notice: This dictation was prepared with Dragon dictation along with smaller phrase technology. Any transcriptional errors that result from this process are unintentional and may not be corrected upon review.

## 2021-02-08 ENCOUNTER — Other Ambulatory Visit: Payer: Self-pay

## 2021-02-08 ENCOUNTER — Encounter: Payer: Self-pay | Admitting: Adult Health

## 2021-02-08 ENCOUNTER — Ambulatory Visit (INDEPENDENT_AMBULATORY_CARE_PROVIDER_SITE_OTHER): Payer: Medicare PPO | Admitting: Adult Health

## 2021-02-08 VITALS — BP 148/94 | HR 69 | Ht 60.0 in | Wt 127.0 lb

## 2021-02-08 DIAGNOSIS — I480 Paroxysmal atrial fibrillation: Secondary | ICD-10-CM

## 2021-02-08 DIAGNOSIS — I5032 Chronic diastolic (congestive) heart failure: Secondary | ICD-10-CM | POA: Diagnosis not present

## 2021-02-08 DIAGNOSIS — I10 Essential (primary) hypertension: Secondary | ICD-10-CM

## 2021-02-08 NOTE — Patient Instructions (Signed)
Medication Instructions:  The current medical regimen is effective;  continue present plan and medications as directed. Please refer to the Current Medication list given to you today.  *If you need a refill on your cardiac medications before your next appointment, please call your pharmacy*  Lab Work:   Testing/Procedures:  NONE    NONE  Follow-Up: Your next appointment:  6 month(s) In Person with You may see Bryan Lemma, MD or one of the following Advanced Practice Providers on your designated Care Team:  Joni Reining, DNP, ANP  Theodore Demark, PA-C  At [facility], you and your health needs are our priority.  As part of our continuing mission to provide you with exceptional heart care, we have created designated Provider Care Teams.  These Care Teams include your primary Cardiologist (physician) and Advanced Practice Providers (APPs -  Physician Assistants and Nurse Practitioners) who all work together to provide you with the care you need, when you need it.

## 2021-02-13 ENCOUNTER — Encounter (HOSPITAL_BASED_OUTPATIENT_CLINIC_OR_DEPARTMENT_OTHER): Payer: Medicare PPO | Admitting: Physician Assistant

## 2021-02-15 ENCOUNTER — Other Ambulatory Visit: Payer: Self-pay

## 2021-02-15 ENCOUNTER — Encounter (HOSPITAL_BASED_OUTPATIENT_CLINIC_OR_DEPARTMENT_OTHER): Payer: Medicare PPO | Admitting: Internal Medicine

## 2021-02-15 DIAGNOSIS — I872 Venous insufficiency (chronic) (peripheral): Secondary | ICD-10-CM | POA: Diagnosis not present

## 2021-02-16 ENCOUNTER — Other Ambulatory Visit: Payer: Self-pay | Admitting: Cardiology

## 2021-02-18 NOTE — Telephone Encounter (Signed)
Prescription refill request for Eliquis received. Indication:atrial fib Last office visit:6/22 Scr:0.8 Age: 80 Weight:57.6 kg  Prescription refilled

## 2021-02-18 NOTE — Progress Notes (Signed)
[name], [name] ([id number]) Visit Report for 02/15/2021 SuperBill Details Patient Name: Date of Service: [place], [place] P. 02/15/2021 Medical Record Number: [id number] Patient Account Number: [id number] Date of Birth/Sex: Treating RN: 1941-08-17 (80 y.o. Donna Simmons Primary Care Provider: Edwina Barth Other Clinician: Referring Provider: Treating Provider/Extender: Geryl Rankins in Treatment: 1 Diagnosis Coding ICD-10 Codes Code Description I87.2 Venous insufficiency (chronic) (peripheral) L97.822 Non-pressure chronic ulcer of other part of left lower leg with fat layer exposed L97.822 Non-pressure chronic ulcer of other part of left lower leg with fat layer exposed I10 Essential (primary) hypertension I48.0 Paroxysmal atrial fibrillation Z79.01 Long term (current) use of anticoagulants Facility Procedures CPT4 Description Modifier Quantity Code [id number] [phone] BILATERAL: Application of multi-layer venous compression system; leg (below knee), including ankle and 1 foot. Electronic Signature(s) Signed: 02/15/2021 5:27:23 PM By: Baltazar Najjar MD Signed: 02/18/2021 5:37:12 PM By: Zandra Abts RN, BSN Entered By: Zandra Abts on 02/15/2021 16:49:15

## 2021-02-18 NOTE — Progress Notes (Signed)
Kathaleen MaserWILLIAMS, Maliea P. ([id number]) Visit Report for 02/15/2021 Arrival Information Details Patient Name: [place] 02/15/2021 1:30 PM Medical Record Number: [id number] Patient Account Number: [id number] Date of Birth/Sex: Treating RN: Jun 09, 1941 (80 y.o. Wynelle LinkF) Lynch, Shatara Primary Care Makisha Marrin: Edwina BarthSagardia, Miguel Other Clinician: Referring Usman Millett: Treating Marciana Uplinger/Extender: Geryl Rankinsobson, Michael Sagardia, Miguel Weeks in Treatment: 1 Visit Information History Since Last Visit Added or deleted any medications: No Patient Arrived: Ambulatory Any new allergies or adverse reactions: No Arrival Time: 13:55 Had a fall or experienced change in No Accompanied By: husband activities of daily living that may affect Transfer Assistance: None risk of falls: Patient Requires Transmission-Based Precautions: No Signs or symptoms of abuse/neglect since last visito No Patient Has Alerts: Yes Hospitalized since last visit: No Patient Alerts: Patient on Blood Thinner Implantable device outside of the clinic excluding No cellular tissue based products placed in the center since last visit: Has Dressing in Place as Prescribed: Yes Has Compression in Place as Prescribed: Yes Pain Present Now: No Electronic Signature(s) Signed: 02/18/2021 5:37:12 PM By: Zandra AbtsLynch, Shatara RN, BSN Entered By: Zandra AbtsLynch, Shatara on 02/15/2021 16:47:07 -------------------------------------------------------------------------------- Compression Therapy Details Patient Name: Date of Service: Orlinda BlalockWILLIA [place], [name] 02/15/2021 1:30 PM Medical Record Number: [id number] Patient Account Number: [id number] Date of Birth/Sex: Treating RN: Jun 09, 1941 [age] y.o. Wynelle LinkF) Lynch, Shatara Primary Care Merve Hotard: Edwina BarthSagardia, Miguel Other Clinician: Referring Farrell Pantaleo: Treating Eliaz Fout/Extender: Geryl Rankinsobson, Michael Sagardia, Miguel Weeks in Treatment: 1 Compression Therapy Performed for Wound Assessment: Wound #1 Right,Anterior Lower  Leg Performed By: Clinician Zandra AbtsLynch, Shatara, RN Compression Type: Three Emergency planning/management officerLayer Electronic Signature(s) Signed: 02/18/2021 5:37:12 PM By: Zandra AbtsLynch, Shatara RN, BSN Entered By: Zandra AbtsLynch, Shatara on 02/15/2021 16:48:34 -------------------------------------------------------------------------------- Compression Therapy Details Patient Name: Date of Service: Orlinda BlalockWILLIA MS, [name] 02/15/2021 1:30 PM Medical Record Number: [id number] Patient Account Number: [id number] Date of Birth/Sex: Treating RN: Jun 09, 1941 [age] y.o. Wynelle LinkF) Lynch, Shatara Primary Care Samarah Hogle: Edwina BarthSagardia, Miguel Other Clinician: Referring Clydie Dillen: Treating Dhillon Comunale/Extender: Geryl Rankinsobson, Michael Sagardia, Miguel Weeks in Treatment: 1 Compression Therapy Performed for Wound Assessment: Wound #2 Right,Posterior Lower Leg Performed By: Clinician Zandra AbtsLynch, Shatara, RN Compression Type: Three Emergency planning/management officerLayer Electronic Signature(s) Signed: 02/18/2021 5:37:12 PM By: Zandra AbtsLynch, Shatara RN, BSN Entered By: Zandra AbtsLynch, Shatara on 02/15/2021 16:48:34 -------------------------------------------------------------------------------- Compression Therapy Details Patient Name: Date of Service: Orlinda BlalockWILLIA [place], [name] 02/15/2021 1:30 PM Medical Record Number: [id number] Patient Account Number: [id number] Date of Birth/Sex: Treating RN: Jun 09, 1941 [age] y.o. Wynelle LinkF) Lynch, Shatara Primary Care Giada Schoppe: Edwina BarthSagardia, Miguel Other Clinician: Referring Elianny Buxbaum: Treating Aerith Canal/Extender: Geryl Rankinsobson, Michael Sagardia, Miguel Weeks in Treatment: 1 Compression Therapy Performed for Wound Assessment: Wound #3 Right,Proximal,Lateral Lower Leg Performed By: Clinician Zandra AbtsLynch, Shatara, RN Compression Type: Three Emergency planning/management officerLayer Electronic Signature(s) Signed: 02/18/2021 5:37:12 PM By: Zandra AbtsLynch, Shatara RN, BSN Entered By: Zandra AbtsLynch, Shatara on 02/15/2021 16:48:34 -------------------------------------------------------------------------------- Compression Therapy Details Patient Name: Date of Service: Orlinda BlalockWILLIA MS,  [name] 02/15/2021 1:30 PM Medical Record Number: [id number] Patient Account Number: [id number] Date of Birth/Sex: Treating RN: Jun 09, 1941 [age] y.o. Wynelle LinkF) Lynch, Shatara Primary Care Ji Fairburn: Edwina BarthSagardia, Miguel Other Clinician: Referring Aaleah Hirsch: Treating Dragan Tamburrino/Extender: Geryl Rankinsobson, Michael Sagardia, Miguel Weeks in Treatment: 1 Compression Therapy Performed for Wound Assessment: Wound #4 Right,Distal,Lateral Lower Leg Performed By: Clinician Zandra AbtsLynch, Shatara, RN Compression Type: Three Emergency planning/management officerLayer Electronic Signature(s) Signed: 02/18/2021 5:37:12 PM By: Zandra AbtsLynch, Shatara RN, BSN Entered By: Zandra AbtsLynch, Shatara on 02/15/2021 16:48:34 -------------------------------------------------------------------------------- Compression Therapy Details Patient Name: Date of Service: Orlinda BlalockWILLIA MS, [name] 02/15/2021 1:30 PM Medical Record Number: [id number] Patient Account Number: [id number] Date of  Birth/Sex: Treating RN: 1941-05-05 (80 y.o. Wynelle Link Primary Care Kaycen Whitworth: Other Clinician: Edwina Barth Referring Lydon Vansickle: Treating Bernardine Langworthy/Extender: Geryl Rankins in Treatment: 1 Compression Therapy Performed for Wound Assessment: Wound #5 Left,Medial,Anterior Lower Leg Performed By: Clinician Zandra Abts, RN Compression Type: Three Emergency planning/management officer) Signed: 02/18/2021 5:37:12 PM By: Zandra Abts RN, BSN Entered By: Zandra Abts on 02/15/2021 16:48:34 -------------------------------------------------------------------------------- Encounter Discharge Information Details Patient Name: Date of Service: [name], [name] 02/15/2021 1:30 PM Medical Record Number: [id number] Patient Account Number: [id number] Date of Birth/Sex: Treating RN: 1941-08-13 (80 y.o. Wynelle Link Primary Care Ciarra Braddy: Edwina Barth Other Clinician: Referring Ayauna Mcnay: Treating Dennie Moltz/Extender: Geryl Rankins in Treatment: 1 Encounter  Discharge Information Items Discharge Condition: Stable Ambulatory Status: Ambulatory Discharge Destination: Home Transportation: Private Auto Accompanied By: husband Schedule Follow-up Appointment: Yes Clinical Summary of Care: Patient Declined Electronic Signature(s) Signed: 02/18/2021 5:37:12 PM By: Zandra Abts RN, BSN Entered By: Zandra Abts on 02/15/2021 16:49:07 -------------------------------------------------------------------------------- Wound Assessment Details Patient Name: Date of Service: [name], [name] 02/15/2021 1:30 PM Medical Record Number: [id number] Patient Account Number: [id number] Date of Birth/Sex: Treating RN: 09/14/1940 (80 y.o. Wynelle Link Primary Care Narjis Mira: Edwina Barth Other Clinician: Referring Shenetta Schnackenberg: Treating Cian Costanzo/Extender: Stasia Cavalier Weeks in Treatment: 1 Wound Status Wound Number: 1 Primary Etiology: Venous Leg Ulcer Wound Location: Right, Anterior Lower Leg Wound Status: Open Wounding Event: Blister Comorbid History: Arrhythmia, Hypertension, Osteoarthritis, Dementia Date Acquired: 11/30/2020 Weeks Of Treatment: 1 Clustered Wound: Yes Wound Measurements Length: (cm) 2 Width: (cm) 4.7 Depth: (cm) 0.1 Clustered Quantity: 3 Area: (cm) 7.383 Volume: (cm) 0.738 % Reduction in Area: 0% % Reduction in Volume: 0% Epithelialization: None Tunneling: No Undermining: No Wound Description Classification: Full Thickness Without Exposed Support Structures Wound Margin: Flat and Intact Exudate Amount: Medium Exudate Type: Serosanguineous Exudate Color: red, brown Foul Odor After Cleansing: No Slough/Fibrino Yes Wound Bed Granulation Amount: Large (67-100%) Exposed Structure Granulation Quality: Pink Fascia Exposed: No Necrotic Amount: Small (1-33%) Fat Layer (Subcutaneous Tissue) Exposed: Yes Necrotic Quality: Adherent Slough Tendon Exposed: No Muscle Exposed: No Joint Exposed:  No Bone Exposed: No Treatment Notes Wound #1 (Lower Leg) Wound Laterality: Right, Anterior Cleanser Peri-Wound Care Sween Lotion (Moisturizing lotion) Discharge Instruction: Apply moisturizing lotion as directed Topical Primary Dressing KerraCel Ag Gelling Fiber Dressing, 4x5 in (silver alginate) Discharge Instruction: Apply silver alginate to wound bed as instructed Secondary Dressing Woven Gauze Sponge, Non-Sterile 4x4 in Discharge Instruction: Apply over primary dressing as directed. Secured With Compression Wrap ThreePress (3 layer compression wrap) Discharge Instruction: Apply three layer compression as directed. Compression Stockings Add-Ons Electronic Signature(s) Signed: 02/18/2021 5:37:12 PM By: Zandra Abts RN, BSN Entered By: Zandra Abts on 02/15/2021 16:47:55 -------------------------------------------------------------------------------- Wound Assessment Details Patient Name: Date of Service: [name], [name] 02/15/2021 1:30 PM Medical Record Number: [id number] Patient Account Number: [id number] Date of Birth/Sex: Treating RN: 30-Jun-1941 (80 y.o. Wynelle Link Primary Care Toniya Rozar: Edwina Barth Other Clinician: Referring Amyr Sluder: Treating Cruz Bong/Extender: Stasia Cavalier Weeks in Treatment: 1 Wound Status Wound Number: 2 Primary Etiology: Venous Leg Ulcer Wound Location: Right, Posterior Lower Leg Wound Status: Open Wounding Event: Blister Comorbid History: Arrhythmia, Hypertension, Osteoarthritis, Dementia Date Acquired: 11/30/2020 Weeks Of Treatment: 1 Clustered Wound: No Wound Measurements Length: (cm) 1 Width: (cm) 2.5 Depth: (cm) 0.1 Area: (cm) 1.963 Volume: (cm) 0.196 % Reduction in Area: 0% % Reduction in Volume: 0% Epithelialization: None Tunneling: No Undermining: No Wound Description  Classification: Full Thickness Without Exposed Support Structu Wound Margin: Flat and Intact Exudate Amount:  Medium Exudate Type: Serosanguineous Exudate Color: red, brown res Foul Odor After Cleansing: No Slough/Fibrino No Wound Bed Granulation Amount: Large (67-100%) Exposed Structure Granulation Quality: Pink Fascia Exposed: No Necrotic Amount: None Present (0%) Fat Layer (Subcutaneous Tissue) Exposed: Yes Tendon Exposed: No Muscle Exposed: No Joint Exposed: No Bone Exposed: No Treatment Notes Wound #2 (Lower Leg) Wound Laterality: Right, Posterior Cleanser Peri-Wound Care Sween Lotion (Moisturizing lotion) Discharge Instruction: Apply moisturizing lotion as directed Topical Primary Dressing KerraCel Ag Gelling Fiber Dressing, 4x5 in (silver alginate) Discharge Instruction: Apply silver alginate to wound bed as instructed Secondary Dressing Woven Gauze Sponge, Non-Sterile 4x4 in Discharge Instruction: Apply over primary dressing as directed. Secured With Compression Wrap ThreePress (3 layer compression wrap) Discharge Instruction: Apply three layer compression as directed. Compression Stockings Add-Ons Electronic Signature(s) Signed: 02/18/2021 5:37:12 PM By: Zandra Abts RN, BSN Entered By: Zandra Abts on 02/15/2021 16:48:03 -------------------------------------------------------------------------------- Wound Assessment Details Patient Name: Date of Service: [name], [name] 02/15/2021 1:30 PM Medical Record Number: [id number] Patient Account Number: [id number] Date of Birth/Sex: Treating RN: 1940/12/30 (80 y.o. Wynelle Link Primary Care Mohamadou Maciver: Edwina Barth Other Clinician: Referring Shivali Quackenbush: Treating Rajeev Escue/Extender: Stasia Cavalier Weeks in Treatment: 1 Wound Status Wound Number: 3 Primary Etiology: Venous Leg Ulcer Wound Location: Right, Proximal, Lateral Lower Leg Wound Status: Open Wounding Event: Blister Comorbid History: Arrhythmia, Hypertension, Osteoarthritis, Dementia Date Acquired: 11/30/2020 Weeks Of Treatment:  1 Clustered Wound: No Wound Measurements Length: (cm) 0.8 Width: (cm) 0.5 Depth: (cm) 0.1 Area: (cm) 0.314 Volume: (cm) 0.031 % Reduction in Area: 0% % Reduction in Volume: 0% Epithelialization: None Tunneling: No Undermining: No Wound Description Classification: Full Thickness Without Exposed Support Structures Wound Margin: Flat and Intact Exudate Amount: Medium Exudate Type: Serosanguineous Exudate Color: red, brown Foul Odor After Cleansing: No Slough/Fibrino Yes Wound Bed Granulation Amount: Medium (34-66%) Exposed Structure Granulation Quality: Pink Fascia Exposed: No Necrotic Amount: Medium (34-66%) Fat Layer (Subcutaneous Tissue) Exposed: Yes Necrotic Quality: Adherent Slough Tendon Exposed: No Muscle Exposed: No Joint Exposed: No Bone Exposed: No Treatment Notes Wound #3 (Lower Leg) Wound Laterality: Right, Lateral, Proximal Cleanser Peri-Wound Care Sween Lotion (Moisturizing lotion) Discharge Instruction: Apply moisturizing lotion as directed Topical Primary Dressing KerraCel Ag Gelling Fiber Dressing, 4x5 in (silver alginate) Discharge Instruction: Apply silver alginate to wound bed as instructed Secondary Dressing Woven Gauze Sponge, Non-Sterile 4x4 in Discharge Instruction: Apply over primary dressing as directed. Secured With Compression Wrap ThreePress (3 layer compression wrap) Discharge Instruction: Apply three layer compression as directed. Compression Stockings Add-Ons Electronic Signature(s) Signed: 02/18/2021 5:37:12 PM By: Zandra Abts RN, BSN Entered By: Zandra Abts on 02/15/2021 16:48:10 -------------------------------------------------------------------------------- Wound Assessment Details Patient Name: Date of Service: [name], [name] 02/15/2021 1:30 PM Medical Record Number: [id number] Patient Account Number: [id number] Date of Birth/Sex: Treating RN: 1941-07-20 (80 y.o. Wynelle Link Primary Care Jarome Trull:  Edwina Barth Other Clinician: Referring Sani Loiseau: Treating Kimon Loewen/Extender: Stasia Cavalier Weeks in Treatment: 1 Wound Status Wound Number: 4 Primary Etiology: Venous Leg Ulcer Wound Location: Right, Distal, Lateral Lower Leg Wound Status: Open Wounding Event: Blister Comorbid History: Arrhythmia, Hypertension, Osteoarthritis, Dementia Date Acquired: 11/30/2020 Weeks Of Treatment: 1 Clustered Wound: No Wound Measurements Length: (cm) 1 Width: (cm) 1 Depth: (cm) 0.1 Area: (cm) 0.785 Volume: (cm) 0.079 % Reduction in Area: 0% % Reduction in Volume: 0% Epithelialization: None Tunneling: No Undermining: No Wound Description Classification: Full  Thickness Without Exposed Support Structures Wound Margin: Flat and Intact Exudate Amount: Medium Exudate Type: Serosanguineous Exudate Color: red, brown Foul Odor After Cleansing: No Slough/Fibrino No Wound Bed Granulation Amount: Large (67-100%) Exposed Structure Granulation Quality: Pink Fascia Exposed: No Necrotic Amount: None Present (0%) Fat Layer (Subcutaneous Tissue) Exposed: Yes Tendon Exposed: No Muscle Exposed: No Joint Exposed: No Bone Exposed: No Treatment Notes Wound #4 (Lower Leg) Wound Laterality: Right, Lateral, Distal Cleanser Peri-Wound Care Sween Lotion (Moisturizing lotion) Discharge Instruction: Apply moisturizing lotion as directed Topical Primary Dressing KerraCel Ag Gelling Fiber Dressing, 4x5 in (silver alginate) Discharge Instruction: Apply silver alginate to wound bed as instructed Secondary Dressing Woven Gauze Sponge, Non-Sterile 4x4 in Discharge Instruction: Apply over primary dressing as directed. Secured With Compression Wrap ThreePress (3 layer compression wrap) Discharge Instruction: Apply three layer compression as directed. Compression Stockings Add-Ons Electronic Signature(s) Signed: 02/18/2021 5:37:12 PM By: Zandra Abts RN, BSN Entered By: Zandra Abts on 02/15/2021 16:48:17 -------------------------------------------------------------------------------- Wound Assessment Details Patient Name: Date of Service: [name], [name] 02/15/2021 1:30 PM Medical Record Number: [id number] Patient Account Number: [id number] Date of Birth/Sex: Treating RN: [birth year] (80 y.o. Wynelle Link Primary Care Xavian Hardcastle: Edwina Barth Other Clinician: Referring Tabari Volkert: Treating Eragon Hammond/Extender: Stasia Cavalier Weeks in Treatment: 1 Wound Status Wound Number: 5 Primary Etiology: Venous Leg Ulcer Wound Location: Left, Medial, Anterior Lower Leg Wound Status: Open Wounding Event: Blister Comorbid History: Arrhythmia, Hypertension, Osteoarthritis, Dementia Date Acquired: 11/30/2020 Weeks Of Treatment: 1 Clustered Wound: No Wound Measurements Length: (cm) 0.6 Width: (cm) 0.6 Depth: (cm) 0.1 Area: (cm) 0.283 Volume: (cm) 0.028 % Reduction in Area: 33.3% % Reduction in Volume: 33.3% Epithelialization: None Tunneling: No Undermining: No Wound Description Classification: Full Thickness Without Exposed Support Structures Wound Margin: Flat and Intact Exudate Amount: Medium Exudate Type: Serosanguineous Exudate Color: red, brown Foul Odor After Cleansing: No Slough/Fibrino No Wound Bed Granulation Amount: Large (67-100%) Exposed Structure Granulation Quality: Pink Fascia Exposed: No Necrotic Amount: None Present (0%) Fat Layer (Subcutaneous Tissue) Exposed: Yes Tendon Exposed: No Muscle Exposed: No Joint Exposed: No Bone Exposed: No Treatment Notes Wound #5 (Lower Leg) Wound Laterality: Left, Medial, Anterior Cleanser Peri-Wound Care Sween Lotion (Moisturizing lotion) Discharge Instruction: Apply moisturizing lotion as directed Topical Primary Dressing KerraCel Ag Gelling Fiber Dressing, 4x5 in (silver alginate) Discharge Instruction: Apply silver alginate to wound bed as instructed Secondary  Dressing Woven Gauze Sponge, Non-Sterile 4x4 in Discharge Instruction: Apply over primary dressing as directed. Secured With Compression Wrap ThreePress (3 layer compression wrap) Discharge Instruction: Apply three layer compression as directed. Compression Stockings Add-Ons Electronic Signature(s) Signed: 02/18/2021 5:37:12 PM By: Zandra Abts RN, BSN Entered By: Zandra Abts on 02/15/2021 16:48:22 -------------------------------------------------------------------------------- Vitals Details Patient Name: Date of Service: [name], [name] 02/15/2021 1:30 PM Medical Record Number: [id number] Patient Account Number: [id number] Date of Birth/Sex: Treating RN: 1940/10/15 (80 y.o. Wynelle Link Primary Care Bradly Sangiovanni: Edwina Barth Other Clinician: Referring Yuvan Medinger: Treating Kyrstyn Greear/Extender: Geryl Rankins in Treatment: 1 Vital Signs Time Taken: 13:55 Temperature (F): 98.2 Pulse (bpm): 67 Respiratory Rate (breaths/min): 18 Blood Pressure (mmHg): 144/81 Reference Range: 80 - 120 mg / dl Electronic Signature(s) Signed: 02/18/2021 5:37:12 PM By: Zandra Abts RN, BSN Entered By: Zandra Abts on 02/15/2021 16:47:20

## 2021-02-20 ENCOUNTER — Encounter (HOSPITAL_BASED_OUTPATIENT_CLINIC_OR_DEPARTMENT_OTHER): Payer: Medicare PPO | Admitting: Physician Assistant

## 2021-02-20 ENCOUNTER — Other Ambulatory Visit: Payer: Self-pay

## 2021-02-20 DIAGNOSIS — I872 Venous insufficiency (chronic) (peripheral): Secondary | ICD-10-CM | POA: Diagnosis not present

## 2021-02-20 NOTE — Progress Notes (Signed)
KODY, VIGIL ([id number]) Visit Report for 02/20/2021 Arrival Information Details Patient Name: Date of Service: [place], [place]. 02/20/2021 1:30 PM Medical Record Number: [id number] Patient Account Number: [id number] Date of Birth/Sex: Treating RN: 09/27/1940 (80 y.o. Arta Silence Primary Care Slaton Reaser: Edwina Barth Other Clinician: Referring Draper Gallon: Treating Karryn Kosinski/Extender: Donnal Debar Weeks in Treatment: 2 Visit Information History Since Last Visit Added or deleted any medications: No Patient Arrived: Ambulatory Any new allergies or adverse reactions: No Arrival Time: 13:40 Had a fall or experienced change in No Accompanied By: self activities of daily living that may affect Transfer Assistance: None risk of falls: Patient Identification Verified: Yes Signs or symptoms of abuse/neglect since last visito No Secondary Verification Process Completed: Yes Hospitalized since last visit: No Patient Requires Transmission-Based Precautions: No Implantable device outside of the clinic excluding No Patient Has Alerts: Yes cellular tissue based products placed in the center Patient Alerts: Patient on Blood Thinner since last visit: Has Dressing in Place as Prescribed: Yes Has Compression in Place as Prescribed: Yes Pain Present Now: No Electronic Signature(s) Signed: 02/20/2021 5:52:09 PM By: Shawn Stall Entered By: Shawn Stall on 02/20/2021 13:47:49 -------------------------------------------------------------------------------- Clinic Level of Care Assessment Details Patient Name: Date of Service: [place], [place]. 02/20/2021 1:30 PM Medical Record Number: [id number] Patient Account Number: [id number] Date of Birth/Sex: Treating RN: [birth year] (80 y.o. Tommye Standard Primary Care Meria Crilly: Edwina Barth Other Clinician: Referring Cassell Voorhies: Treating Jezabel Lecker/Extender: Donnal Debar Weeks in Treatment:  2 Clinic Level of Care Assessment Items TOOL 4 Quantity Score  - 0 Use when only an EandM is performed on FOLLOW-UP visit ASSESSMENTS - Nursing Assessment / Reassessment X- 1 10 Reassessment of Co-morbidities (includes updates in patient status) X- 1 5 Reassessment of Adherence to Treatment Plan ASSESSMENTS - Wound and Skin A ssessment / Reassessment  - 0 Simple Wound Assessment / Reassessment - one wound X- 5 5 Complex Wound Assessment / Reassessment - multiple wounds  - 0 Dermatologic / Skin Assessment (not related to wound area) ASSESSMENTS - Focused Assessment X- 2 5 Circumferential Edema Measurements - multi extremities  - 0 Nutritional Assessment / Counseling / Intervention X- 1 5 Lower Extremity Assessment (monofilament, tuning fork, pulses)  - 0 Peripheral Arterial Disease Assessment (using hand held doppler) ASSESSMENTS - Ostomy and/or Continence Assessment and Care  - 0 Incontinence Assessment and Management  - 0 Ostomy Care Assessment and Management (repouching, etc.) PROCESS - Coordination of Care X - Simple Patient / Family Education for ongoing care 1 15  - 0 Complex (extensive) Patient / Family Education for ongoing care X- 1 10 Staff obtains Chiropractor, Records, T Results / Process Orders est  - 0 Staff telephones HHA, Nursing Homes / Clarify orders / etc  - 0 Routine Transfer to another Facility (non-emergent condition)  - 0 Routine Hospital Admission (non-emergent condition)  - 0 New Admissions / Manufacturing engineer / Ordering NPWT Apligraf, etc. ,  - 0 Emergency Hospital Admission (emergent condition) X- 1 10 Simple Discharge Coordination  - 0 Complex (extensive) Discharge Coordination PROCESS - Special Needs  - 0 Pediatric / Minor Patient Management  - 0 Isolation Patient Management  - 0 Hearing / Language / Visual special needs  - 0 Assessment of Community assistance (transportation, D/C planning,  etc.)  - 0 Additional assistance / Altered mentation  - 0 Support Surface(s) Assessment (bed, cushion, seat, etc.) INTERVENTIONS - Wound Cleansing / Measurement  - 0 Simple Wound  Cleansing - one wound X- 5 5 Complex Wound Cleansing - multiple wounds X- 1 5 Wound Imaging (photographs - any number of wounds) []  - 0 Wound Tracing (instead of photographs) []  - 0 Simple Wound Measurement - one wound []  - 0 Complex Wound Measurement - multiple wounds INTERVENTIONS - Wound Dressings []  - 0 Small Wound Dressing one or multiple wounds []  - 0 Medium Wound Dressing one or multiple wounds []  - 0 Large Wound Dressing one or multiple wounds []  - 0 Application of Medications - topical []  - 0 Application of Medications - injection INTERVENTIONS - Miscellaneous []  - 0 External ear exam []  - 0 Specimen Collection (cultures, biopsies, blood, body fluids, etc.) []  - 0 Specimen(s) / Culture(s) sent or taken to Lab for analysis []  - 0 Patient Transfer (multiple staff / / Similar devices) []  - 0 Simple Staple / Suture removal (25 or less) []  - 0 Complex Staple / Suture removal (26 or more) []  - 0 Hypo / Hyperglycemic Management (close monitor of Blood Glucose) []  - 0 Ankle / Brachial Index (ABI) - do not check if billed separately X- 1 5 Vital Signs Has the patient been seen at the hospital within the last three years: Yes Total Score: 125 Level Of Care: New/Established - Level 4 Electronic Signature(s) Signed: 02/20/2021 5:44:00 PM By: RN, BSN Entered By: on 02/20/2021 14:02:46 -------------------------------------------------------------------------------- Encounter Discharge Information Details Patient Name: Date of Service: [place], [name] 02/20/2021 1:30 PM Medical Record Number: Patient Account Number: Date of Birth/Sex: Treating RN: March 04, 1941 (80 y.o. , Lauren Primary Care Skyelynn Rambeau:  Nurse, adult Other Clinician: Referring Meda Dudzinski: Treating Freddrick Gladson/Extender: in Treatment: 2 Encounter Discharge Information Items Discharge Condition: Stable Ambulatory Status: Ambulatory Discharge Destination: Home Transportation: Private Auto Accompanied By: self Schedule Follow-up Appointment: Yes Clinical Summary of Care: Patient Declined Electronic Signature(s) Signed: 02/20/2021 5:40:17 PM By: RN Entered By: on 02/20/2021 14:24:15 -------------------------------------------------------------------------------- Lower Extremity Assessment Details Patient Name: Date of Service: Zenaida Deed [place], [name] 02/20/2021 1:30 PM Medical Record Number: 02/22/2021 Patient Account Number: Orlinda Blalock Date of Birth/Sex: Treating RN: [birth year] (80 y.o. [id number], 06/17/1941 Primary Care Bairon Klemann: (68 Other Clinician: Referring Lahna Nath: Treating Hollyann Pablo/Extender: Ardis Rowan Weeks in Treatment: 2 Edema Assessment Assessed: [Left: Yes] [Right: Yes] Edema: [Left: No] [Right: No] Calf Left: Right: Point of Measurement: 31 cm From Medial Instep 34.8 cm 35 cm Ankle Left: Right: Point of Measurement: 11 cm From Medial Instep 23.8 cm 23.1 cm Knee To Floor Left: Right: From Medial Instep 40 cm 40 cm Vascular Assessment Pulses: Dorsalis Pedis Palpable: [Left:Yes] [Right:Yes] Electronic Signature(s) Signed: 02/20/2021 5:44:00 PM By: Raquel James RN, BSN Signed: 02/20/2021 5:52:09 PM By: Fonnie Mu Entered By: Fonnie Mu on 02/20/2021 14:09:15 -------------------------------------------------------------------------------- Multi-Disciplinary Care Plan Details Patient Name: Date of Service: [name], [name] 02/20/2021 1:30 PM Medical Record Number: [id number] Patient Account Number: [id number] Date of Birth/Sex: Treating RN: December 09, 1940 (80 y.o. Debara Pickett Primary  Care Abdon Petrosky: Millard.Loa Other Clinician: Referring Mylissa Lambe: Treating Margart Zemanek/Extender: Edwina Barth in Treatment: 2 Multidisciplinary Care Plan reviewed with physician Active Inactive Electronic Signature(s) Signed: 02/20/2021 5:44:00 PM By: 02/22/2021 RN, BSN Entered By: Zenaida Deed on 02/20/2021 14:01:47 -------------------------------------------------------------------------------- Pain Assessment Details Patient Name: Date of Service: Shawn Stall [place], [name] 02/20/2021 1:30 PM Medical Record Number: 02/22/2021 Patient Account Number: Orlinda Blalock Date  of Birth/Sex: Treating RN: 1941/06/29 (80 y.o. Arta Silence Primary Care Niva Murren: Edwina Barth Other Clinician: Referring Venezia Sargeant: Treating Rakeisha Nyce/Extender: Donnal Debar Weeks in Treatment: 2 Active Problems Location of Pain Severity and Description of Pain Patient Has Paino No Site Locations Rate the pain. Rate the pain. Current Pain Level: 0 Pain Management and Medication Current Pain Management: Medication: No Cold Application: No Rest: No Massage: No Activity: No T.E.N.S.: No Heat Application: No Leg drop or elevation: No Is the Current Pain Management Adequate: Adequate How does your wound impact your activities of daily livingo Sleep: No Bathing: No Appetite: No Relationship With Others: No Bladder Continence: No Emotions: No Bowel Continence: No Work: No Toileting: No Drive: No Dressing: No Hobbies: No Electronic Signature(s) Signed: 02/20/2021 5:52:09 PM By: Shawn Stall Entered By: Shawn Stall on 02/20/2021 13:48:21 -------------------------------------------------------------------------------- Patient/Caregiver Education Details Patient Name: Date of Service: [name], [place] RY Demetrius Charity 6/22/2022andnbsp1:30 PM Medical Record Number: [id number] Patient Account Number: [id number] Date of Birth/Gender: Treating RN: 02-Jul-1941 (80  y.o. Tommye Standard Primary Care Physician: Edwina Barth Other Clinician: Referring Physician: Treating Physician/Extender: Raquel James in Treatment: 2 Education Assessment Education Provided To: Patient Education Topics Provided Venous: Methods: Explain/Verbal Responses: Reinforcements needed, State content correctly Wound/Skin Impairment: Methods: Explain/Verbal Responses: Reinforcements needed, State content correctly Electronic Signature(s) Signed: 02/20/2021 5:44:00 PM By: Zenaida Deed RN, BSN Signed: 02/20/2021 5:44:00 PM By: Zenaida Deed RN, BSN Entered By: Zenaida Deed on 02/20/2021 14:01:29 -------------------------------------------------------------------------------- Wound Assessment Details Patient Name: Date of Service: [name], [name] 02/20/2021 1:30 PM Medical Record Number: [id number] Patient Account Number: [id number] Date of Birth/Sex: Treating RN: Apr 07, 1941 (80 y.o. Debara Pickett, Yvonne Kendall Primary Care Arrow Emmerich: Edwina Barth Other Clinician: Referring Samantha Olivera: Treating Staisha Winiarski/Extender: Donnal Debar Weeks in Treatment: 2 Wound Status Wound Number: 1 Primary Etiology: Venous Leg Ulcer Wound Location: Right, Anterior Lower Leg Wound Status: Healed - Epithelialized Wounding Event: Blister Comorbid History: Arrhythmia, Hypertension, Osteoarthritis, Dementia Date Acquired: 11/30/2020 Weeks Of Treatment: 2 Clustered Wound: Yes Photos Wound Measurements Length: (cm) Width: (cm) Depth: (cm) Clustered Quantity: Area: (cm) Volume: (cm) 0 % Reduction in Area: 100% 0 % Reduction in Volume: 100% 0 Epithelialization: None 3 0 0 Wound Description Classification: Full Thickness Without Exposed Support Structures Wound Margin: Flat and Intact Exudate Amount: Medium Exudate Type: Serosanguineous Exudate Color: red, brown Foul Odor After Cleansing: No Slough/Fibrino Yes Wound  Bed Granulation Amount: Large (67-100%) Exposed Structure Granulation Quality: Pink Fascia Exposed: No Necrotic Amount: Small (1-33%) Fat Layer (Subcutaneous Tissue) Exposed: Yes Necrotic Quality: Adherent Slough Tendon Exposed: No Muscle Exposed: No Joint Exposed: No Bone Exposed: No Treatment Notes Wound #1 (Lower Leg) Wound Laterality: Right, Anterior Cleanser Peri-Wound Care Topical Primary Dressing Secondary Dressing Secured With Compression Wrap Compression Stockings Add-Ons Electronic Signature(s) Signed: 02/20/2021 4:31:55 PM By: Karl Ito Signed: 02/20/2021 5:52:09 PM By: Shawn Stall Entered By: Karl Ito on 02/20/2021 16:19:08 -------------------------------------------------------------------------------- Wound Assessment Details Patient Name: Date of Service: [name], [name] 02/20/2021 1:30 PM Medical Record Number: [id number] Patient Account Number: [id number] Date of Birth/Sex: Treating RN: May 08, 1941 (80 y.o. Arta Silence Primary Care Yoshua Geisinger: Edwina Barth Other Clinician: Referring Shawntell Dixson: Treating Liston Thum/Extender: Alvino Chapel, Irving Shows Weeks in Treatment: 2 Wound Status Wound Number: 2 Primary Etiology: Venous Leg Ulcer Wound Location: Right, Posterior Lower Leg Wound Status: Healed - Epithelialized Wounding Event: Blister Date Acquired: 11/30/2020 Weeks Of Treatment: 2 Clustered Wound: No Wound Measurements Length: (cm) Width: (  cm) Depth: (cm) Area: (cm) Volume: (cm) 0 % Reduction in Area: 100% 0 % Reduction in Volume: 100% 0 0 0 Wound Description Classification: Full Thickness Without Exposed Support Structur es Treatment Notes Wound #2 (Lower Leg) Wound Laterality: Right, Posterior Cleanser Peri-Wound Care Topical Primary Dressing Secondary Dressing Secured With Compression Wrap Compression Stockings Add-Ons Electronic Signature(s) Signed: 02/20/2021 5:52:09 PM By: Shawn Stalleaton,  Bobbi Entered By: Shawn Stalleaton, Bobbi on 02/20/2021 13:49:19 -------------------------------------------------------------------------------- Wound Assessment Details Patient Name: Date of Service: Orlinda BlalockWILLIA [place], [name] 02/20/2021 1:30 PM Medical Record Number: [id number] Patient Account Number: [id number] Date of Birth/Sex: Treating RN: 05/30/1941 [age] y.o. Debara PickettF) Deaton, Yvonne KendallBobbi Primary Care Daila Elbert: Edwina BarthSagardia, Miguel Other Clinician: Referring Babbie Dondlinger: Treating Alyah Boehning/Extender: Donnal DebarStone III, Hoyt Sagardia, Miguel Weeks in Treatment: 2 Wound Status Wound Number: 3 Primary Etiology: Venous Leg Ulcer Wound Location: Right, Proximal, Lateral Lower Leg Wound Status: Healed - Epithelialized Wounding Event: Blister Date Acquired: 11/30/2020 Weeks Of Treatment: 2 Clustered Wound: No Wound Measurements Length: (cm) Width: (cm) Depth: (cm) Area: (cm) Volume: (cm) 0 % Reduction in Area: 100% 0 % Reduction in Volume: 100% 0 0 0 Wound Description Classification: Full Thickness Without Exposed Support Structur es Treatment Notes Wound #3 (Lower Leg) Wound Laterality: Right, Lateral, Proximal Cleanser Peri-Wound Care Topical Primary Dressing Secondary Dressing Secured With Compression Wrap Compression Stockings Add-Ons Electronic Signature(s) Signed: 02/20/2021 5:52:09 PM By: Shawn Stalleaton, Bobbi Entered By: Shawn Stalleaton, Bobbi on 02/20/2021 13:49:19 -------------------------------------------------------------------------------- Wound Assessment Details Patient Name: Date of Service: Orlinda BlalockWILLIA [place], [name] 02/20/2021 1:30 PM Medical Record Number: [id number] Patient Account Number: [id number] Date of Birth/Sex: Treating RN: 05/30/1941 [age] y.o. Debara PickettF) Deaton, Yvonne KendallBobbi Primary Care Brunella Wileman: Edwina BarthSagardia, Miguel Other Clinician: Referring Shreyan Hinz: Treating Rokia Bosket/Extender: Donnal DebarStone III, Hoyt Sagardia, Miguel Weeks in Treatment: 2 Wound Status Wound Number: 4 Primary Etiology: Venous Leg Ulcer Wound  Location: Right, Distal, Lateral Lower Leg Wound Status: Healed - Epithelialized Wounding Event: Blister Date Acquired: 11/30/2020 Weeks Of Treatment: 2 Clustered Wound: No Wound Measurements Length: (cm) Width: (cm) Depth: (cm) Area: (cm) Volume: (cm) 0 % Reduction in Area: 100% 0 % Reduction in Volume: 100% 0 0 0 Wound Description Classification: Full Thickness Without Exposed Support Structur es Treatment Notes Wound #4 (Lower Leg) Wound Laterality: Right, Lateral, Distal Cleanser Peri-Wound Care Topical Primary Dressing Secondary Dressing Secured With Compression Wrap Compression Stockings Add-Ons Electronic Signature(s) Signed: 02/20/2021 5:52:09 PM By: Shawn Stalleaton, Bobbi Entered By: Shawn Stalleaton, Bobbi on 02/20/2021 13:49:20 -------------------------------------------------------------------------------- Wound Assessment Details Patient Name: Date of Service: Orlinda BlalockWILLIA [place], [name] 02/20/2021 1:30 PM Medical Record Number: [id number] Patient Account Number: [id number] Date of Birth/Sex: Treating RN: 05/30/1941 [age] y.o. Debara PickettF) Deaton, Yvonne KendallBobbi Primary Care Nessie Nong: Edwina BarthSagardia, Miguel Other Clinician: Referring Seraphina Mitchner: Treating Jahmari Esbenshade/Extender: Donnal DebarStone III, Hoyt Sagardia, Miguel Weeks in Treatment: 2 Wound Status Wound Number: 5 Primary Etiology: Venous Leg Ulcer Wound Location: Left, Medial, Anterior Lower Leg Wound Status: Healed - Epithelialized Wounding Event: Blister Comorbid History: Arrhythmia, Hypertension, Osteoarthritis, Dementia Date Acquired: 11/30/2020 Weeks Of Treatment: 2 Clustered Wound: No Photos Wound Measurements Length: (cm) Width: (cm) Depth: (cm) Area: (cm) Volume: (cm) 0 % Reduction in Area: 100% 0 % Reduction in Volume: 100% 0 Epithelialization: None 0 0 Wound Description Classification: Full Thickness Without Exposed Support Structu Wound Margin: Flat and Intact Exudate Amount: Medium Exudate Type: Serosanguineous Exudate Color: red,  brown res Foul Odor After Cleansing: No Slough/Fibrino No Wound Bed Granulation Amount: Large (67-100%) Exposed Structure Granulation Quality: Pink Fascia Exposed: No Necrotic Amount: None Present (0%) Fat Layer (Subcutaneous Tissue) Exposed: Yes  Tendon Exposed: No Muscle Exposed: No Joint Exposed: No Bone Exposed: No Treatment Notes Wound #5 (Lower Leg) Wound Laterality: Left, Medial, Anterior Cleanser Peri-Wound Care Topical Primary Dressing Secondary Dressing Secured With Compression Wrap Compression Stockings Add-Ons Electronic Signature(s) Signed: 02/20/2021 4:31:55 PM By: Karl Ito Signed: 02/20/2021 5:52:09 PM By: Shawn Stall Entered By: Karl Ito on 02/20/2021 16:17:24 -------------------------------------------------------------------------------- Vitals Details Patient Name: Date of Service: [name], [name] 02/20/2021 1:30 PM Medical Record Number: [id number] Patient Account Number: [id number] Date of Birth/Sex: Treating RN: 1940-12-29 (80 y.o. Arta Silence Primary Care Jayant Kriz: Edwina Barth Other Clinician: Referring Phillipa Morden: Treating Jaya Lapka/Extender: Alvino Chapel, Irving Shows Weeks in Treatment: 2 Vital Signs Time Taken: 13:40 Temperature (F): 98.4 Pulse (bpm): 62 Respiratory Rate (breaths/min): 18 Blood Pressure (mmHg): 139/75 Reference Range: 80 - 120 mg / dl Electronic Signature(s) Signed: 02/20/2021 5:52:09 PM By: Shawn Stall Entered By: Shawn Stall on 02/20/2021 13:48:09

## 2021-02-20 NOTE — Progress Notes (Addendum)
Donna Simmons, Donna Simmons ([id number]) Visit Report for 02/20/2021 Chief Complaint Document Details Patient Name: Date of Service: [place], [place]. 02/20/2021 1:30 PM Medical Record Number: [id number] Patient Account Number: [id number] Date of Birth/Sex: Treating RN: 1941/04/07 (80 y.o. Tommye Standard Primary Care Provider: Edwina Barth Other Clinician: Referring Provider: Treating Provider/Extender: Alvino Chapel, Irving Shows Weeks in Treatment: 2 Information Obtained from: Patient Chief Complaint Bilateral LE Ulcers Electronic Signature(s) Signed: 02/20/2021 1:38:48 PM By: Lenda Kelp PA-C Entered By: Lenda Kelp on 02/20/2021 13:38:47 -------------------------------------------------------------------------------- HPI Details Patient Name: Date of Service: [facility] [place], [name] 02/20/2021 1:30 PM Medical Record Number: [id number] Patient Account Number: [id number] Date of Birth/Sex: Treating RN: [birth year] (80 y.o. Tommye Standard Primary Care Provider: Edwina Barth Other Clinician: Referring Provider: Treating Provider/Extender: Donnal Debar Weeks in Treatment: 2 History of Present Illness HPI Description: 02/06/2021 upon evaluation today patient presents for initial evaluation here in clinic concerning issues he has been having with her legs. Fortunately there does not appear to be any signs of active infection at this time which is great news. She was given Keflex by the emergency department when she was initially seen here. With that being said the patient did not have any poor response to the Keflex she actually tolerated it quite well and overall seems to be doing quite well at this time. No fevers, chills, nausea, vomiting, or diarrhea. It was on May 17 that she was given the Keflex. Currently they have been putting Neosporin on this followed by Kerlix and Coban. Her husband's been doing this at home. She is on Eliquis due to atrial  fibrillation. Other than atrial fibrillation the patient also is on the long-term anticoagulant therapy with Eliquis secondary to this, has hypertension, and chronic venous insufficiency. She has been told she should wear compression stockings but is not able to get those on. 02/20/2021 upon evaluation today patient appears to be doing well with regard to her wounds. She has been tolerating the dressing changes without complication. Fortunately there does not appear to be any evidence of active infection which is great news and overall very pleased with where things stand today. No fevers, chills, nausea, vomiting, or diarrhea. Electronic Signature(s) Signed: 02/20/2021 2:12:47 PM By: Lenda Kelp PA-C Entered By: Lenda Kelp on 02/20/2021 14:12:47 -------------------------------------------------------------------------------- Physical Exam Details Patient Name: Date of Service: [facility] [place], [name] 02/20/2021 1:30 PM Medical Record Number: [id number] Patient Account Number: [id number] Date of Birth/Sex: Treating RN: 05/31/1941 (80 y.o. Tommye Standard Primary Care Provider: Edwina Barth Other Clinician: Referring Provider: Treating Provider/Extender: Donnal Debar Weeks in Treatment: 2 Constitutional Well-nourished and well-hydrated in no acute distress. Respiratory normal breathing without difficulty. Psychiatric this patient is able to make decisions and demonstrates good insight into disease process. Alert and Oriented x 3. pleasant and cooperative. Notes Patient's wound bed actually showed signs of complete epithelization this appears to be completely closed and I am very pleased in that regard. I do not see any evidence of infection right now which is great and overall I think that she is ready for discharge. She does however need compression we will probably not use Ace wraps today and then subsequently we will see about having them order compression  socks from elastic therapy working to get the measurements and information for that today to her husband. Electronic Signature(s) Signed: 02/20/2021 2:13:18 PM By: Lenda Kelp PA-C Entered By: Lenda Kelp on  02/20/2021 14:13:17 -------------------------------------------------------------------------------- Physician Orders Details [place]: [place] 02/20/2021 1:30 PM Medical Record Number: [id number] Patient Account Number: [id number] Date of Birth/Sex: Treating RN: 1941/05/05 (80 y.o. 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 Edema Control - Lymphedema / SCD / Other Bilateral Lower Extremities Elevate legs to the level of the heart or above for 30 minutes daily and/or when sitting, a frequency of: - throughout the day whenever sitting Avoid standing for long periods of time. Exercise regularly Moisturize legs daily. - nightly after removing stockings Compression stocking or Garment 10-20 mm/Hg pressure to: - both legs daily Non Wound Condition Other Non Wound Condition Orders/Instructions: - ace wraps both legs daily until stockings available Electronic Signature(s) Signed: 02/20/2021 4:01:47 PM By: Lenda KelpStone III, Jaquavious Mercer PA-C Signed: 02/20/2021 5:44:00 PM By:  Zenaida DeedBoehlein, Linda RN, BSN Entered By: Zenaida DeedBoehlein, Linda on 02/20/2021 14:11:54 -------------------------------------------------------------------------------- Problem List Details Patient Name: [name] 02/20/2021 1:30 PM Medical Record Number: [id number] Patient Account Number: [id number] Date of Birth/Sex: Treating RN: 1941/05/05 73(79 y.o. Tommye StandardF) Boehlein, Linda Primary Care Provider: Edwina BarthSagardia, Miguel Other Clinician: Referring Provider: Treating Provider/Extender: Donnal DebarStone III, Ryane Canavan Sagardia, Miguel Weeks in Treatment: 2 Active Problems ICD-10 Encounter Code Description Active Date MDM Diagnosis I87.2 Venous insufficiency (chronic) (peripheral) 02/06/2021 No Yes L97.822 Non-pressure chronic ulcer of other part of left lower leg with fat layer exposed6/04/2021 No Yes L97.822 Non-pressure chronic ulcer of other part of left lower leg with fat layer exposed6/04/2021 No Yes I10 Essential (primary) hypertension 02/06/2021 No Yes I48.0 Paroxysmal atrial fibrillation 02/06/2021 No Yes Z79.01 Long term (current) use of anticoagulants 02/06/2021 No Yes Inactive Problems Resolved Problems Electronic Signature(s) Signed: 02/20/2021 1:38:41 PM By: Lenda KelpStone III, Janazia Schreier PA-C Entered By: Lenda KelpStone III, Darleene Cumpian on 02/20/2021 13:38:41 -------------------------------------------------------------------------------- Progress Note Details [facility]: [facility] [place] [name] 02/20/2021 1:30 PM Medical Record Number: [id number] Patient Account Number: [id number] Date of Birth/Sex: Treating RN: 1941/05/05 21(79 y.o. Tommye StandardF) Boehlein, Linda Primary Care Provider: Edwina BarthSagardia, Miguel Other Clinician: Referring Provider: Treating Provider/Extender: Donnal DebarStone III, Alleyne Lac Sagardia, Miguel Weeks in Treatment: 2 Subjective Chief Complaint Information obtained from Patient Bilateral LE Ulcers History of Present Illness (HPI) 02/06/2021 upon evaluation today patient presents for initial evaluation here in  clinic concerning issues he has been having with her legs. Fortunately there does not appear to be any signs of active infection at this time which is great news. She was given Keflex by the emergency department when she was initially seen here. With that being said the patient did not have any poor response to the Keflex she actually tolerated it quite well and overall seems to be doing quite well at this time. No fevers, chills, nausea, vomiting, or diarrhea. It was on May 17 that she was given the Keflex. Currently they have been putting Neosporin on this followed by Kerlix and Coban. Her husband's been doing this  at home. She is on Eliquis due to atrial fibrillation. Other than atrial fibrillation the patient also is on the long-term anticoagulant therapy with Eliquis secondary to this, has hypertension, and chronic venous insufficiency. She has been told she should wear compression stockings but is not able to get those on. 02/20/2021 upon evaluation today patient appears to be doing well with regard to her wounds. She has been tolerating the dressing changes without complication. Fortunately there does not appear to be any evidence of active infection which is great news and overall very pleased with where things stand today. No fevers, chills, nausea, vomiting, or diarrhea. Objective Constitutional Well-nourished and well-hydrated in no acute distress. Vitals Time Taken: 1:40 PM, Temperature: 98.4 F, Pulse: 62 bpm, Respiratory Rate: 18 breaths/min, Blood Pressure: 139/75 mmHg. Respiratory normal breathing without difficulty. Psychiatric this patient is able to make decisions and demonstrates good insight into disease process. Alert and Oriented x 3. pleasant and cooperative. General Notes: Patient's wound bed actually showed signs of complete epithelization this appears to be completely closed and I am very pleased in that regard. I do not see any evidence of infection right now which is  great and overall I think that she is ready for discharge. She does however need compression we will probably not use Ace wraps today and then subsequently we will see about having them order compression socks from elastic therapy working to get the measurements and information for that today to her husband. Integumentary (Hair, Skin) Wound #1 status is Healed - Epithelialized. Original cause of wound was Blister. The date acquired was: 11/30/2020. The wound has been in treatment 2 weeks. The wound is located on the Right,Anterior Lower Leg. The wound measures 0cm length x 0cm width x 0cm depth; 0cm^2 area and 0cm^3 volume. Wound #2 status is Healed - Epithelialized. Original cause of wound was Blister. The date acquired was: 11/30/2020. The wound has been in treatment 2 weeks. The wound is located on the Right,Posterior Lower Leg. The wound measures 0cm length x 0cm width x 0cm depth; 0cm^2 area and 0cm^3 volume. Wound #3 status is Healed - Epithelialized. Original cause of wound was Blister. The date acquired was: 11/30/2020. The wound has been in treatment 2 weeks. The wound is located on the Right,Proximal,Lateral Lower Leg. The wound measures 0cm length x 0cm width x 0cm depth; 0cm^2 area and 0cm^3 volume. Wound #4 status is Healed - Epithelialized. Original cause of wound was Blister. The date acquired was: 11/30/2020. The wound has been in treatment 2 weeks. The wound is located on the Right,Distal,Lateral Lower Leg. The wound measures 0cm length x 0cm width x 0cm depth; 0cm^2 area and 0cm^3 volume. Wound #5 status is Healed - Epithelialized. Original cause of wound was Blister. The date acquired was: 11/30/2020. The wound has been in treatment 2 weeks. The wound is located on the Left,Medial,Anterior Lower Leg. The wound measures 0cm length x 0cm width x 0cm depth; 0cm^2 area and 0cm^3 volume. Assessment Active Problems ICD-10 Venous insufficiency (chronic) (peripheral) Non-pressure chronic ulcer of  other part of left lower leg with fat layer exposed Non-pressure chronic ulcer of other part of left lower leg with fat layer exposed Essential (primary) hypertension Paroxysmal atrial fibrillation Long term (current) use of anticoagulants Plan Discharge From [facility] Services: Discharge from Wound Care Center Bathing/ Shower/ Hygiene: May shower and wash wound with soap and water. Edema Control - Lymphedema / SCD / Other: Elevate legs to the level of the heart or above  for 30 minutes daily and/or when sitting, a frequency of: - throughout the day whenever sitting Avoid standing for long periods of time. Exercise regularly Moisturize legs daily. - nightly after removing stockings Compression stocking or Garment 10-20 mm/Hg pressure to: - both legs daily Non Wound Condition: Other Non Wound Condition Orders/Instructions: - ace wraps both legs daily until stockings available 1. Would recommend currently that we going continue with the wound care measures as before and the patient is in agreement with the plan. This includes the use of compression. We will get a go ahead and get her set up with some compression socks today as well. Fortunately we had some that were donated here in the clinic which we think will fit her. 2. I am also can recommend at this time that we have the patient go ahead and continue with the elevation of her legs try to keep edema under good control. I think this is still going to be the ideal thing as far as helping out along with the compression. We will see patient back for reevaluation in 1 week here in the clinic. If anything worsens or changes patient will contact our office for additional recommendations. Electronic Signature(s) Signed: 02/20/2021 2:14:44 PM By: Lenda Kelp PA-C Entered By: Lenda Kelp on 02/20/2021 14:14:43 -------------------------------------------------------------------------------- SuperBill Details Patient Name: Date of  Service: [facility] [place], [name] 02/20/2021 Medical Record Number: [id number] Patient Account Number: [id number] Date of Birth/Sex: Treating RN: [birth year] (80 y.o. Tommye Standard Primary Care Provider: Edwina Barth Other Clinician: Referring Provider: Treating Provider/Extender: Donnal Debar Weeks in Treatment: 2 Diagnosis Coding ICD-10 Codes Code Description I87.2 Venous insufficiency (chronic) (peripheral) L97.822 Non-pressure chronic ulcer of other part of left lower leg with fat layer exposed L97.822 Non-pressure chronic ulcer of other part of left lower leg with fat layer exposed I10 Essential (primary) hypertension I48.0 Paroxysmal atrial fibrillation Z79.01 Long term (current) use of anticoagulants Facility Procedures CPT4 Code: [id number] Description: 99214 - WOUND CARE VISIT-LEV 4 EST PT Modifier: Quantity: 1 Physician Procedures Electronic Signature(s) Signed: 02/20/2021 2:15:01 PM By: Lenda Kelp PA-C Entered By: Lenda Kelp on 02/20/2021 14:15:00

## 2021-03-28 ENCOUNTER — Ambulatory Visit: Payer: Medicare PPO | Admitting: Sports Medicine

## 2021-04-02 ENCOUNTER — Other Ambulatory Visit: Payer: Self-pay

## 2021-04-02 MED ORDER — METOPROLOL SUCCINATE ER 50 MG PO TB24: ORAL_TABLET | ORAL | 1 refills | Status: DC

## 2021-05-24 ENCOUNTER — Other Ambulatory Visit: Payer: Self-pay | Admitting: Gastroenterology

## 2021-05-24 DIAGNOSIS — R634 Abnormal weight loss: Secondary | ICD-10-CM

## 2021-06-11 ENCOUNTER — Other Ambulatory Visit: Payer: Self-pay

## 2021-06-11 ENCOUNTER — Ambulatory Visit
Admission: RE | Admit: 2021-06-11 | Discharge: 2021-06-11 | Disposition: A | Discharge status: Home / Self Care | Payer: Medicare PPO | Source: Ambulatory Visit | Attending: Gastroenterology | Admitting: Gastroenterology

## 2021-06-11 DIAGNOSIS — R634 Abnormal weight loss: Secondary | ICD-10-CM

## 2021-06-11 MED ORDER — IOPAMIDOL (ISOVUE-300) INJECTION 61%
100.0000 mL | Freq: Once | INTRAVENOUS | Status: AC | PRN
  Administered 2021-06-11: 80 mL via INTRAVENOUS

## 2021-07-01 ENCOUNTER — Emergency Department (HOSPITAL_COMMUNITY)
Admission: EM | Admit: 2021-07-01 | Discharge: 2021-07-02 | Disposition: A | Discharge status: Home / Self Care | Payer: Medicare PPO | Attending: Emergency Medicine | Admitting: Emergency Medicine

## 2021-07-01 ENCOUNTER — Emergency Department (HOSPITAL_COMMUNITY): Payer: Medicare PPO

## 2021-07-01 ENCOUNTER — Other Ambulatory Visit: Payer: Self-pay

## 2021-07-01 DIAGNOSIS — F0390 Unspecified dementia without behavioral disturbance: Secondary | ICD-10-CM | POA: Insufficient documentation

## 2021-07-01 DIAGNOSIS — L03115 Cellulitis of right lower limb: Secondary | ICD-10-CM | POA: Diagnosis not present

## 2021-07-01 DIAGNOSIS — R109 Unspecified abdominal pain: Secondary | ICD-10-CM | POA: Diagnosis not present

## 2021-07-01 DIAGNOSIS — L03119 Cellulitis of unspecified part of limb: Secondary | ICD-10-CM

## 2021-07-01 DIAGNOSIS — L089 Local infection of the skin and subcutaneous tissue, unspecified: Secondary | ICD-10-CM | POA: Diagnosis not present

## 2021-07-01 DIAGNOSIS — I480 Paroxysmal atrial fibrillation: Secondary | ICD-10-CM | POA: Insufficient documentation

## 2021-07-01 DIAGNOSIS — Z79899 Other long term (current) drug therapy: Secondary | ICD-10-CM | POA: Diagnosis not present

## 2021-07-01 DIAGNOSIS — I10 Essential (primary) hypertension: Secondary | ICD-10-CM | POA: Diagnosis not present

## 2021-07-01 DIAGNOSIS — R350 Frequency of micturition: Secondary | ICD-10-CM | POA: Insufficient documentation

## 2021-07-01 DIAGNOSIS — Z7901 Long term (current) use of anticoagulants: Secondary | ICD-10-CM | POA: Diagnosis not present

## 2021-07-01 DIAGNOSIS — L03116 Cellulitis of left lower limb: Secondary | ICD-10-CM | POA: Diagnosis not present

## 2021-07-01 LAB — CBC WITH DIFFERENTIAL/PLATELET
Abs Immature Granulocytes: 0.01 10*3/uL (ref 0.00–0.07)
Basophils Absolute: 0 10*3/uL (ref 0.0–0.1)
Basophils Relative: 0 %
Eosinophils Absolute: 0 10*3/uL (ref 0.0–0.5)
Eosinophils Relative: 0 %
HCT: 31.6 % — ABNORMAL LOW (ref 36.0–46.0)
Hemoglobin: 10 g/dL — ABNORMAL LOW (ref 12.0–15.0)
Immature Granulocytes: 0 %
Lymphocytes Relative: 14 %
Lymphs Abs: 0.8 10*3/uL (ref 0.7–4.0)
MCH: 26.3 pg (ref 26.0–34.0)
MCHC: 31.6 g/dL (ref 30.0–36.0)
MCV: 83.2 fL (ref 80.0–100.0)
Monocytes Absolute: 0.4 10*3/uL (ref 0.1–1.0)
Monocytes Relative: 8 %
Neutro Abs: 4.2 10*3/uL (ref 1.7–7.7)
Neutrophils Relative %: 78 %
Platelets: 182 10*3/uL (ref 150–400)
RBC: 3.8 MIL/uL — ABNORMAL LOW (ref 3.87–5.11)
RDW: 16.8 % — ABNORMAL HIGH (ref 11.5–15.5)
WBC: 5.4 10*3/uL (ref 4.0–10.5)
nRBC: 0 % (ref 0.0–0.2)

## 2021-07-01 LAB — BASIC METABOLIC PANEL
Anion gap: 7 (ref 5–15)
BUN: 16 mg/dL (ref 8–23)
CO2: 26 mmol/L (ref 22–32)
Calcium: 9 mg/dL (ref 8.9–10.3)
Chloride: 106 mmol/L (ref 98–111)
Creatinine, Ser: 0.79 mg/dL (ref 0.44–1.00)
GFR, Estimated: >60 mL/min (ref >60)
Glucose, Bld: 89 mg/dL (ref 70–99)
Potassium: 3.4 mmol/L — ABNORMAL LOW (ref 3.5–5.1)
Sodium: 139 mmol/L (ref 135–145)

## 2021-07-01 MED ORDER — CEPHALEXIN 500 MG PO CAPS: 500.0000 mg | ORAL_CAPSULE | Freq: Four times a day (QID) | ORAL | 0 refills | Status: DC

## 2021-07-01 NOTE — ED Notes (Signed)
Attempted to In and out cath the pt, however pt became combative. Retired to In and out cath 20 minutes later and found pt had already used the bathroom on herself. Pt was cleaned up and comfort measures were provided. MD notified.

## 2021-07-01 NOTE — Discharge Instructions (Addendum)
Take the antibiotics prescribed for your wound.  The same medicine should also cover you for urinary infection.    Continue to make sure that the wound is properly followed by the wound care team.  Return to the ER if the symptoms get worse.

## 2021-07-01 NOTE — ED Notes (Signed)
IV removed from left [facility], pt helped to the bathroom again.

## 2021-07-01 NOTE — ED Triage Notes (Signed)
Pt BIB [facility] from home c/o abdminal pain for 2 days. Pt denies N/V/D, but has noted in increase in urination. Pt states she goes almost every hour. Pt does take furosemide and has increased swelling and discoloration in both lower extremities. Hx of dementia and is on blood thinners.

## 2021-07-01 NOTE — ED Notes (Signed)
Pt continues to get up, redirected pt.  Await [facility] arrival.  Toileting offered

## 2021-07-01 NOTE — ED Provider Notes (Addendum)
[facility] DEPT Provider Note   CSN: [id number] Arrival date & time: 07/01/21  1216     History Chief Complaint  Patient presents with   Abdominal Pain   Urinary Frequency   Wound Infection    Donna Simmons is a 80 y.o. female.  HPI     80 year old female comes in with chief complaint of abdominal pain, urinary frequency. Patient has history of paroxysmal A. fib on blood thinners, hypertension, hyperlipidemia.  She also has dementia.  She reports about 2-day history of urinary frequency.  She is going to the bathroom every hour or so.  No incontinence.  No back pain.  She is having some abdominal pain in the lower quadrants.  I spoke with patient's husband, who reports that patient has been complaining of abdominal pain starting today.  He also reports increased urinary symptoms that she has complained about.  No history of UTI recently.  No nausea, vomiting, fevers, chills.   Additionally, EMS reported that patient has a wound that might need close evaluation.  Husband reports that the wound is being observed by wound care team/home nurse.  Past Medical History:  Diagnosis Date   Allergy    GERD (gastroesophageal reflux disease)    Heart murmur    No significant valvular lesion noted on echo.   Hypercholesteremia    Hypertension    Memory loss    Paroxysmal atrial fibrillation ([place]) 12/27/2008    Patient Active Problem List   Diagnosis Date Noted   Bilateral lower extremity edema 08/19/2020   Other forms of angina pectoris ([place]) 07/25/2018   Long term current use of anticoagulant therapy 11/18/2012   Hypercholesteremia 09/25/2012   DDD (degenerative disc disease), cervical 05/19/2012   Paroxysmal atrial fibrillation ([place]) - CHA2DS2-VASc Score 3, on Eliquis 04/20/2012   Essential hypertension 04/20/2012    Past Surgical History:  Procedure Laterality Date   ABDOMINAL HYSTERECTOMY  1980   BREAST SURGERY     CT CTA CORONARY W/CA  SCORE W/CM &/OR WO/CM  08/2018   Coronary calcium score 24.  Nonobstructive CAD with less than 30% plaque in proximal LAD.  Normal aortic root. ->  Relook in October 2020 had significant motion artifact but coronary calcium score 26.   EYE SURGERY Right 12/29/2016   EYE SURGERY Left 01/19/2017   FRACTURE SURGERY     LEFT HEART CATH AND CORONARY ANGIOGRAPHY  12/16/2010   no evidence of CAD to explain anginal pain w/ positive troponin.  potential etiology is breakthrough AF   NM MYOVIEW LTD  April 2010   EF 64%, normal pattern of perfusion in all regions, no scintigraphic evidence of inducible ischemia; no significant wall motion abnormalities; EKG negative for ischemia; no significant change from last study; low risk scan   TRANSTHORACIC ECHOCARDIOGRAM  07/2018   Normal EF 55-60%.  No RWMA. GR 1 DD. Ao Sclerosis - no Stenosis. Mild AI. Trivial MR.      OB History   No obstetric history on file.     Family History  Problem Relation Age of Onset   Arthritis Mother        OSTEO   Hypertension Mother    Dementia Mother    Heart disease Father    Hypertension Sister    Dementia Sister    Cancer - Lung Brother    Cancer Maternal Grandmother    Heart disease Maternal Grandfather    Cancer - Lung Brother    Hypertension Brother  Cancer - Prostate Brother     Social History   Tobacco Use   Smoking status: Never   Smokeless tobacco: Never  Vaping Use   Vaping Use: Never used  Substance Use Topics   Alcohol use: No    Alcohol/week: 0.0 standard drinks   Drug use: No    Home Medications Prior to Admission medications   Medication Sig Start Date End Date Taking? Authorizing Provider  acetaminophen (TYLENOL) 325 MG tablet Take 325-650 mg by mouth daily as needed (pain or headaches).     [provider]  amLODipine (NORVASC) 10 MG tablet Take 10 mg by mouth daily. 09/14/17   [provider]  apixaban Arne [place]) 5 MG TABS tablet Take 1 tablet by mouth twice daily  02/18/21   Leonie Man, MD  Blood Pressure Monitoring (BLOOD PRESSURE CUFF) MISC Take your  blood pressure with blood pressure cuff/moniotor- can be automatic or manual - one to three  times a week as needed 04/20/19   Leonie Man, MD  ENSURE (ENSURE) Take 237 mLs by mouth daily.    [provider]  flecainide (TAMBOCOR) 50 MG tablet TAKE 1 TABLET BY MOUTH TWICE DAILY . APPOINTMENT REQUIRED FOR FUTURE REFILLS 01/14/21   Leonie Man, MD  fluticasone [facility]) 50 MCG/ACT nasal spray USE TWO SPRAY(S) IN EACH NOSTRIL ONCE DAILY Patient taking differently: Place 1 spray into both nostrils daily. 01/05/17   Wendie Agreste, MD  furosemide (LASIX) 20 MG tablet Take 1 tablet (20 mg total) by mouth daily. Take daily for 5 days then continue medication per Primary Care provider guidance. 01/15/21   Katy Apo, NP  lisinopril (ZESTRIL) 20 MG tablet Take 1 tablet by mouth once daily 08/14/20   Leonie Man, MD  memantine [facility]) 5 MG tablet Take 1 tablet daily for one week, then take 1 tablet twice daily for one week, then take 1 tablet in the morning and 2 in the evening for one week, then take 2 tablets twice daily 07/27/19   Kathrynn Ducking, MD  metoprolol succinate (TOPROL-XL) 50 MG 24 hr tablet TAKE 1 TABLET BY MOUTH ONCE DAILY WITH OR IMMEDIATELY FOLLOWING A MEAL 04/02/21   Leonie Man, MD  metoprolol tartrate (LOPRESSOR) 50 MG tablet Take 1 tablet only with flecainide as needed for rapid fast heart rate 08/08/19   Leonie Man, MD  Multiple Vitamins-Minerals (CENTRUM SILVER 50+WOMEN) TABS Take 1 tablet by mouth daily with lunch.    [provider]  MYRBETRIQ 25 MG TB24 tablet Take 25 mg by mouth daily. 09/15/20   [provider]  omeprazole (PRILOSEC) 40 MG capsule  01/23/20   [provider]  omeprazole (PRILOSEC) 40 MG capsule 1 cap(s)    [provider]  rosuvastatin (CRESTOR) 20 MG tablet Take 1 tablet by mouth once daily 08/08/20    Leonie Man, MD  sucralfate (CARAFATE) 1 GM/10ML suspension Take 10 mLs (1 g total) by mouth 4 (four) times daily -  with meals and at bedtime. 01/05/18   Wendie Agreste, MD    Allergies    Antihistamines, chlorpheniramine-type  Review of Systems   Review of Systems  Constitutional:  Positive for activity change.  Respiratory:  Negative for shortness of breath.   Cardiovascular:  Negative for chest pain.  Gastrointestinal:  Positive for abdominal pain.  Genitourinary:  Positive for frequency. Negative for dysuria.   Physical Exam Updated Vital Signs BP (!) 142/98   Pulse  65   Temp 97.7 F (36.5 C) (Oral)   Resp 18   Ht 5' (1.524 m)   Wt 58 kg   SpO2 100%   BMI 24.97 kg/m   Physical Exam Vitals and nursing note reviewed.  Constitutional:      Appearance: She is well-developed.  HENT:     Head: Atraumatic.  Cardiovascular:     Rate and Rhythm: Normal rate.  Pulmonary:     Effort: Pulmonary effort is normal.  Abdominal:     Tenderness: There is abdominal tenderness in the suprapubic area.  Musculoskeletal:     Cervical back: Normal range of motion and neck supple.  Skin:    General: Skin is warm and dry.     Comments: Bilateral lower extremity swelling, with minimal pitting.  Right anterior tibia has an ulcer which is covered in granulation tissue.  There is erythema in both of the legs.  Neurological:     Mental Status: She is alert and oriented to person, place, and time.       ED Results / Procedures / Treatments   Labs (all labs ordered are listed, but only abnormal results are displayed) Labs Reviewed  BASIC METABOLIC PANEL - Abnormal; Notable for the following components:      Result Value   Potassium 3.4 (*)    All other components within normal limits  CBC WITH DIFFERENTIAL/PLATELET - Abnormal; Notable for the following components:   RBC 3.80 (*)    Hemoglobin 10.0 (*)    HCT 31.6 (*)    RDW 16.8 (*)    All other components within normal  limits  URINALYSIS, ROUTINE W REFLEX MICROSCOPIC    EKG None  Radiology CT Renal Stone Study  Result Date: 07/01/2021 CLINICAL DATA:  Abdominal pain and increased urinary frequency. EXAM: CT ABDOMEN AND PELVIS WITHOUT CONTRAST TECHNIQUE: Multidetector CT imaging of the abdomen and pelvis was performed following the standard protocol without IV contrast. COMPARISON:  CT of the abdomen and pelvis with contrast on 06/11/2021 FINDINGS: Lower chest: No acute abnormality. Hepatobiliary: No focal liver abnormality is seen. No gallstones, gallbladder wall thickening, or biliary dilatation. Pancreas: Unremarkable. No pancreatic ductal dilatation or surrounding inflammatory changes. Spleen: Normal in size without focal abnormality. Adrenals/Urinary Tract: No adrenal masses. No evidence of hydronephrosis, renal calculi, ureteral calculi or bladder abnormalities. Stomach/Bowel: Bowel shows no evidence of obstruction, ileus or inflammation. No obvious lesions by unenhanced CT. No evidence of free intraperitoneal air. Vascular/Lymphatic: Mild atherosclerosis of the abdominal aorta without aneurysm. No enlarged lymph nodes identified. Reproductive: Status post hysterectomy. No adnexal masses. Other: Trace free fluid in the posterior pelvis. No evidence of abscess or hernia. Musculoskeletal: No acute or significant osseous findings. IMPRESSION: No acute findings in the abdomen or pelvis. Mild atherosclerosis of the abdominal aorta without aneurysm. Electronically Signed   By: Irish Lack M.D.   On: 07/01/2021 15:08    Procedures Procedures   Medications Ordered in ED Medications - No data to display  ED Course  I have reviewed the triage vital signs and the nursing notes.  Pertinent labs & imaging results that were available during my care of the patient were reviewed by me and considered in my medical decision making (see chart for details).    MDM Rules/Calculators/A&P                            80 year old female comes in with chief complaint of abdominal pain  and urinary frequency.  I spoke with patient's husband as well given that there is some history of dementia.  He gives similar history as well.  On exam patient has really no peritoneal findings.  She is having suprapubic tenderness, differential diagnosis includes cystitis, kidney stones, diverticulitis.  Patient was unable to give [place] a urine sample, therefore I proceeded to get a CT scan to make sure there is no other process going on and no evidence of hydronephrosis or urinary retention. CT scan is reassuring.  Spoke with the patient's nurse, patient still has not voided properly.  She read herself on 1 occasion and is not cooperating with in and out catheterization.  Moreover patient is getting more confused.  I think she is having delirium now.  I had looked at her wound.  My plan was to put her on some Keflex anyways for the wound, perhaps it will be not necessary to get a urine sample.  We will discharge her with Keflex that should cover her for both UTI and the cellulitis.  I called husband, he reports that he cannot pick up the patient.  Transport requested.  Final Clinical Impression(s) / ED Diagnoses Final diagnoses:  Urinary frequency  Cellulitis of lower extremity, unspecified laterality    Rx / DC Orders ED Discharge Orders     None        Varney Biles, MD 07/01/21 [place], [place], MD 07/01/21 [phone]

## 2021-07-01 NOTE — ED Notes (Signed)
Patient walking around in the hall way. Assisted patient back to room and turned on the bed alarm

## 2021-07-01 NOTE — ED Notes (Signed)
Spoke with pt spouse Mr  Chrissie Noa to confirm that he would like pt transported back by ambulance.  Pt is third on list for pick up at this time.  I went over pt discharge paperwork and instructions with Mr.  Moulin.  Pt continues to wander out to nurses station, redirected.  Meal given, toileting offered.

## 2021-07-01 NOTE — ED Notes (Signed)
Pt continues to wander to nursing station, advised that transport has been called and pt is able to be redirected.

## 2021-07-01 NOTE — ED Notes (Signed)
[facility] called. Patient added to transport list. 

## 2021-08-06 ENCOUNTER — Emergency Department (HOSPITAL_COMMUNITY): Payer: Medicare PPO

## 2021-08-06 ENCOUNTER — Emergency Department (HOSPITAL_COMMUNITY)
Admission: EM | Admit: 2021-08-06 | Discharge: 2021-08-07 | Disposition: A | Discharge status: Home / Self Care | Payer: Medicare PPO | Attending: Emergency Medicine | Admitting: Emergency Medicine

## 2021-08-06 DIAGNOSIS — S20419A Abrasion of unspecified back wall of thorax, initial encounter: Secondary | ICD-10-CM | POA: Insufficient documentation

## 2021-08-06 DIAGNOSIS — I480 Paroxysmal atrial fibrillation: Secondary | ICD-10-CM | POA: Diagnosis not present

## 2021-08-06 DIAGNOSIS — Z79899 Other long term (current) drug therapy: Secondary | ICD-10-CM | POA: Insufficient documentation

## 2021-08-06 DIAGNOSIS — F0390 Unspecified dementia without behavioral disturbance: Secondary | ICD-10-CM | POA: Insufficient documentation

## 2021-08-06 DIAGNOSIS — Z7901 Long term (current) use of anticoagulants: Secondary | ICD-10-CM | POA: Insufficient documentation

## 2021-08-06 DIAGNOSIS — R350 Frequency of micturition: Secondary | ICD-10-CM | POA: Insufficient documentation

## 2021-08-06 DIAGNOSIS — I10 Essential (primary) hypertension: Secondary | ICD-10-CM | POA: Diagnosis not present

## 2021-08-06 DIAGNOSIS — N390 Urinary tract infection, site not specified: Secondary | ICD-10-CM | POA: Diagnosis not present

## 2021-08-06 DIAGNOSIS — R2243 Localized swelling, mass and lump, lower limb, bilateral: Secondary | ICD-10-CM | POA: Diagnosis not present

## 2021-08-06 DIAGNOSIS — W19XXXA Unspecified fall, initial encounter: Secondary | ICD-10-CM | POA: Diagnosis not present

## 2021-08-06 DIAGNOSIS — S20409A Unspecified superficial injuries of unspecified back wall of thorax, initial encounter: Secondary | ICD-10-CM | POA: Diagnosis present

## 2021-08-06 LAB — CBC WITH DIFFERENTIAL/PLATELET
Abs Immature Granulocytes: 0.01 10*3/uL (ref 0.00–0.07)
Basophils Absolute: 0 10*3/uL (ref 0.0–0.1)
Basophils Relative: 1 %
Eosinophils Absolute: 0 10*3/uL (ref 0.0–0.5)
Eosinophils Relative: 0 %
HCT: 33 % — ABNORMAL LOW (ref 36.0–46.0)
Hemoglobin: 10.4 g/dL — ABNORMAL LOW (ref 12.0–15.0)
Immature Granulocytes: 0 %
Lymphocytes Relative: 22 %
Lymphs Abs: 0.8 10*3/uL (ref 0.7–4.0)
MCH: 26.2 pg (ref 26.0–34.0)
MCHC: 31.5 g/dL (ref 30.0–36.0)
MCV: 83.1 fL (ref 80.0–100.0)
Monocytes Absolute: 0.3 10*3/uL (ref 0.1–1.0)
Monocytes Relative: 7 %
Neutro Abs: 2.7 10*3/uL (ref 1.7–7.7)
Neutrophils Relative %: 70 %
Platelets: 173 10*3/uL (ref 150–400)
RBC: 3.97 MIL/uL (ref 3.87–5.11)
RDW: 15.1 % (ref 11.5–15.5)
WBC: 3.9 10*3/uL — ABNORMAL LOW (ref 4.0–10.5)
nRBC: 0 % (ref 0.0–0.2)

## 2021-08-06 LAB — COMPREHENSIVE METABOLIC PANEL
ALT: 17 U/L (ref 0–44)
AST: 17 U/L (ref 15–41)
Albumin: 3.6 g/dL (ref 3.5–5.0)
Alkaline Phosphatase: 51 U/L (ref 38–126)
Anion gap: 7 (ref 5–15)
BUN: 13 mg/dL (ref 8–23)
CO2: 25 mmol/L (ref 22–32)
Calcium: 9 mg/dL (ref 8.9–10.3)
Chloride: 106 mmol/L (ref 98–111)
Creatinine, Ser: 0.8 mg/dL (ref 0.44–1.00)
GFR, Estimated: >60 mL/min (ref >60)
Glucose, Bld: 86 mg/dL (ref 70–99)
Potassium: 3.5 mmol/L (ref 3.5–5.1)
Sodium: 138 mmol/L (ref 135–145)
Total Bilirubin: 0.7 mg/dL (ref 0.3–1.2)
Total Protein: 6.7 g/dL (ref 6.5–8.1)

## 2021-08-06 LAB — URINALYSIS, ROUTINE W REFLEX MICROSCOPIC
Bilirubin Urine: NEGATIVE
Glucose, UA: NEGATIVE mg/dL
Ketones, ur: NEGATIVE mg/dL
Nitrite: NEGATIVE
Protein, ur: NEGATIVE mg/dL
Specific Gravity, Urine: 1.015 (ref 1.005–1.030)
WBC, UA: >50 WBC/hpf — ABNORMAL HIGH (ref 0–5)
pH: 6 (ref 5.0–8.0)

## 2021-08-06 MED ORDER — CEPHALEXIN 500 MG PO CAPS: 500.0000 mg | ORAL_CAPSULE | Freq: Four times a day (QID) | ORAL | 0 refills | Status: DC

## 2021-08-06 MED ORDER — CEPHALEXIN 500 MG PO CAPS
500.0000 mg | ORAL_CAPSULE | Freq: Once | ORAL | Status: AC
  Administered 2021-08-06: 500 mg via ORAL
  Filled 2021-08-06: qty 1

## 2021-08-06 MED ORDER — LORAZEPAM 2 MG/ML IJ SOLN
1.0000 mg | Freq: Once | INTRAMUSCULAR | Status: AC
  Administered 2021-08-06: 1 mg via INTRAMUSCULAR
  Filled 2021-08-06: qty 1

## 2021-08-06 NOTE — ED Notes (Signed)
Pt not cooperative with attempts to obtain vitals

## 2021-08-06 NOTE — Discharge Instructions (Addendum)
Take Keflex 4 times daily for the next 5 days.  This is to treat the urinary tract infection.  Your imaging today was reassuring, no signs of fracture.  On the CT scan they did notice a small nodule in the right upper lung, you should discuss this with the primary care doctor when you see her next.  You  may need to have a repeat CT scan in 6 months to make sure it is not growing.  Follow-up with your primary care doctor in the next week.

## 2021-08-06 NOTE — ED Provider Notes (Signed)
Patient care assumed from PA S. Caccavale.  She has-year-old for complete history and physical exam, in short patient presents due to mechanical fall.  She is ambulatory at baseline without any walker or assistance, has a history of dementia that has been has been more confused over the last few days.  The patient denies any pain anywhere, no preceding symptoms.  States she does have some burning with urination.  Takes medicine for atrial fibrillation, did not hit her head.  She is caring conversation with me ended with the prior provider.  Answers questions follows commands well.  Physical Exam  BP 115/62   Pulse (!) 52   Temp 98.4 F (36.9 C) (Oral)   Resp 10   SpO2 100%   Physical Exam Vitals and nursing note reviewed.  Constitutional:      General: She is not in acute distress.    Appearance: She is well-developed.  HENT:     Head: Normocephalic and atraumatic.  Eyes:     Conjunctiva/sclera: Conjunctivae normal.  Cardiovascular:     Rate and Rhythm: Regular rhythm. Bradycardia present.     Heart sounds: No murmur heard. Pulmonary:     Effort: Pulmonary effort is normal. No respiratory distress.     Breath sounds: Normal breath sounds.  Abdominal:     Palpations: Abdomen is soft.     Tenderness: There is no abdominal tenderness.     Comments: Abdomen is soft with no tenderness, no guarding or rigidity.  No CVA tenderness  Musculoskeletal:        General: No swelling.     Cervical back: Neck supple.  Skin:    General: Skin is warm and dry.     Capillary Refill: Capillary refill takes less than 2 seconds.  Neurological:     General: No focal deficit present.     Mental Status: She is alert. Mental status is at baseline.     Cranial Nerves: No cranial nerve deficit.  Psychiatric:        Mood and Affect: Mood normal.   ED Course/Procedures     Procedures  MDM  Patient has a bradycardia but no evidence of the heart block on monitoring.  Labs without any gross electrolyte  derangement, leukocytosis or anemia.  Radiograph negative for any fractures to the back or chest wall.  He does have a leukocytosis and slight hemoglobin, however there is no tenderness to the abdomen and no CVA tenderness.  Do not suspect acute abdomen, [place], nephrolithiasis-we will treat as a UTI and get send out a urine culture.  CT head and neck unremarkable, there was a nodule found on the CT in her right lung which patient is aware of and I included in her discharge paperwork.  Patient will need follow-up on an emergent basis if PCP advises.  Patient discharged in stable condition, she was able to ambulate without any hypoxia or instability.       Theron Arista, PA-C 08/06/21 1929    Gerhard Munch, MD 08/06/21 2308

## 2021-08-06 NOTE — ED Notes (Signed)
Pt ambulatory with minimal assistance down the hallway and back.

## 2021-08-06 NOTE — ED Notes (Signed)
[facility] called for transport home. 

## 2021-08-06 NOTE — ED Triage Notes (Signed)
Pt arrived via EMS, from home, witnessed mechanical fall by husband, no head strike, no LOC. EMS reports abrasion to lower back. Pt with hx of dementia, does not recall event.

## 2021-08-06 NOTE — ED Provider Notes (Signed)
[facility]-EMERGENCY DEPT Provider Note   CSN: [id number] Arrival date & time: 08/06/21  1323     History Chief Complaint  Patient presents with   Donna Simmons is a 80 y.o. female presenting for evaluation after a fall.  Level 5 caveat due to dementia.  Husband states patient stood up and tried to walk but instead stumbled, and fell landing on her butt.  The same thing happened last night.  She does not normally have issues with walking.  Does not use a walker or cane.  She did not hit her head or lose consciousness.  He reports she is having urinary frequency, but no known hematuria or dysuria.  No fever, cough, chest pain, shortness of breath, nausea, vomiting, change in bowel movements.  Husband states patient was slightly more confused today than normal, fixated on her dress instead of carrying on a normal conversation   HPI     Past Medical History:  Diagnosis Date   Allergy    GERD (gastroesophageal reflux disease)    Heart murmur    No significant valvular lesion noted on echo.   Hypercholesteremia    Hypertension    Memory loss    Paroxysmal atrial fibrillation (HCC) 12/27/2008    Patient Active Problem List   Diagnosis Date Noted   Bilateral lower extremity edema 08/19/2020   Other forms of angina pectoris (HCC) 07/25/2018   Long term current use of anticoagulant therapy 11/18/2012   Hypercholesteremia 09/25/2012   DDD (degenerative disc disease), cervical 05/19/2012   Paroxysmal atrial fibrillation (HCC) - CHA2DS2-VASc Score 3, on Eliquis 04/20/2012   Essential hypertension 04/20/2012    Past Surgical History:  Procedure Laterality Date   ABDOMINAL HYSTERECTOMY  1980   BREAST SURGERY     CT CTA CORONARY W/CA SCORE W/CM &/OR WO/CM  08/2018   Coronary calcium score 24.  Nonobstructive CAD with less than 30% plaque in proximal LAD.  Normal aortic root. ->  Relook in October 2020 had significant motion artifact but coronary  calcium score 26.   EYE SURGERY Right 12/29/2016   EYE SURGERY Left 01/19/2017   FRACTURE SURGERY     LEFT HEART CATH AND CORONARY ANGIOGRAPHY  12/16/2010   no evidence of CAD to explain anginal pain w/ positive troponin.  potential etiology is breakthrough AF   NM MYOVIEW LTD  April 2010   EF 64%, normal pattern of perfusion in all regions, no scintigraphic evidence of inducible ischemia; no significant wall motion abnormalities; EKG negative for ischemia; no significant change from last study; low risk scan   TRANSTHORACIC ECHOCARDIOGRAM  07/2018   Normal EF 55-60%.  No RWMA. GR 1 DD. Ao Sclerosis - no Stenosis. Mild AI. Trivial MR.      OB History   No obstetric history on file.     Family History  Problem Relation Age of Onset   Arthritis Mother        OSTEO   Hypertension Mother    Dementia Mother    Heart disease Father    Hypertension Sister    Dementia Sister    Cancer - Lung Brother    Cancer Maternal Grandmother    Heart disease Maternal Grandfather    Cancer - Lung Brother    Hypertension Brother    Cancer - Prostate Brother     Social History   Tobacco Use   Smoking status: Never   Smokeless tobacco: Never  Vaping Use  Vaping Use: Never used  Substance Use Topics   Alcohol use: No    Alcohol/week: 0.0 standard drinks   Drug use: No    Home Medications Prior to Admission medications   Medication Sig Start Date End Date Taking? Authorizing Provider  acetaminophen (TYLENOL) 325 MG tablet Take 325-650 mg by mouth daily as needed (pain or headaches).     [provider]  amLODipine (NORVASC) 10 MG tablet Take 10 mg by mouth daily. 09/14/17   [provider]  apixaban Arne Cleveland) 5 MG TABS tablet Take 1 tablet by mouth twice daily 02/18/21   Leonie Man, MD  Blood Pressure Monitoring (BLOOD PRESSURE CUFF) MISC Take your  blood pressure with blood pressure cuff/moniotor- can be automatic or manual - one to three  times a week as needed  04/20/19   Leonie Man, MD  cephALEXin (KEFLEX) 500 MG capsule Take 1 capsule (500 mg total) by mouth 4 (four) times daily. 07/01/21   Varney Biles, MD  ENSURE (ENSURE) Take 237 mLs by mouth daily.    [provider]  flecainide (TAMBOCOR) 50 MG tablet TAKE 1 TABLET BY MOUTH TWICE DAILY . APPOINTMENT REQUIRED FOR FUTURE REFILLS 01/14/21   Leonie Man, MD  fluticasone [facility]) 50 MCG/ACT nasal spray USE TWO SPRAY(S) IN EACH NOSTRIL ONCE DAILY Patient taking differently: Place 1 spray into both nostrils daily. 01/05/17   Wendie Agreste, MD  furosemide (LASIX) 20 MG tablet Take 1 tablet (20 mg total) by mouth daily. Take daily for 5 days then continue medication per Primary Care provider guidance. 01/15/21   Katy Apo, NP  lisinopril (ZESTRIL) 20 MG tablet Take 1 tablet by mouth once daily 08/14/20   Leonie Man, MD  memantine [facility]) 5 MG tablet Take 1 tablet daily for one week, then take 1 tablet twice daily for one week, then take 1 tablet in the morning and 2 in the evening for one week, then take 2 tablets twice daily 07/27/19   Kathrynn Ducking, MD  metoprolol succinate (TOPROL-XL) 50 MG 24 hr tablet TAKE 1 TABLET BY MOUTH ONCE DAILY WITH OR IMMEDIATELY FOLLOWING A MEAL 04/02/21   Leonie Man, MD  metoprolol tartrate (LOPRESSOR) 50 MG tablet Take 1 tablet only with flecainide as needed for rapid fast heart rate 08/08/19   Leonie Man, MD  Multiple Vitamins-Minerals (CENTRUM SILVER 50+WOMEN) TABS Take 1 tablet by mouth daily with lunch.    [provider]  MYRBETRIQ 25 MG TB24 tablet Take 25 mg by mouth daily. 09/15/20   [provider]  omeprazole (PRILOSEC) 40 MG capsule  01/23/20   [provider]  omeprazole (PRILOSEC) 40 MG capsule 1 cap(s)    [provider]  rosuvastatin (CRESTOR) 20 MG tablet Take 1 tablet by mouth once daily 08/08/20   Leonie Man, MD  sucralfate (CARAFATE) 1 GM/10ML suspension Take 10 mLs (1  g total) by mouth 4 (four) times daily -  with meals and at bedtime. 01/05/18   Wendie Agreste, MD    Allergies    Antihistamines, chlorpheniramine-type  Review of Systems   Review of Systems  Unable to perform ROS: Dementia   Physical Exam Updated Vital Signs BP 119/61   Pulse (!) 51   Temp 98.4 F (36.9 C) (Oral)   Resp 20   SpO2 99%   Physical Exam Vitals and nursing note reviewed.  Constitutional:      General: She is not in  acute distress.    Appearance: Normal appearance.  HENT:     Head: Normocephalic and atraumatic.  Eyes:     Extraocular Movements: Extraocular movements intact.     Conjunctiva/sclera: Conjunctivae normal.     Pupils: Pupils are equal, round, and reactive to light.  Cardiovascular:     Rate and Rhythm: Normal rate and regular rhythm.     Pulses: Normal pulses.  Pulmonary:     Effort: Pulmonary effort is normal. No respiratory distress.     Breath sounds: Normal breath sounds. No wheezing.     Comments: Speaking in full sentences.  Clear lung sounds in all fields. Abdominal:     General: There is no distension.     Palpations: Abdomen is soft. There is no mass.     Tenderness: There is no abdominal tenderness. There is no guarding or rebound.  Musculoskeletal:        General: Normal range of motion.     Cervical back: Normal range of motion and neck supple.     Right lower leg: Edema present.     Left lower leg: Edema present.     Comments: Very superficial abrasion of the mid thoracic back. No ttp. No injury elsewhere to the back.  Bilateral lower leg swelling, baseline per husband. No deformity of the upper or lower extremities.  Able to lift legs off bed without difficulty.  Skin:    General: Skin is warm and dry.     Capillary Refill: Capillary refill takes less than 2 seconds.  Neurological:     Mental Status: She is alert and oriented to person, place, and time.  Psychiatric:        Mood and Affect: Mood and affect normal.         Speech: Speech normal.        Behavior: Behavior normal.    ED Results / Procedures / Treatments   Labs (all labs ordered are listed, but only abnormal results are displayed) Labs Reviewed - No data to display  EKG None  Radiology No results found.  Procedures Procedures   Medications Ordered in ED Medications - No data to display  ED Course  I have reviewed the triage vital signs and the nursing notes.  Pertinent labs & imaging results that were available during my care of the patient were reviewed by me and considered in my medical decision making (see chart for details).    MDM Rules/Calculators/A&P                           Patient presenting for evaluation after fall.  On exam, patient appears nontoxic.  No sign of trauma is a very superficial abrasion of the mid thoracic back.  No injury noted elsewhere.  She does have some edema, but this is baseline per husband.  In the setting of possible weakness causing stumbling, will obtain labs, urine, chest x-ray.  Will obtain chest x-ray and pelvis x-ray to rule out injury.  While patient did not hit her head, considering her age and that she is on a blood thinner, will obtain imaging.  Labs interpreted by me, overall reassuring.  Urine is still pending.  Chest and pelvis x-rays viewed and independently interpreted by me, no fracture or signs of infection.  CT head and neck pending.  Pt signed out to [employer], PA-C for f/u on urine and imaging. Pt to be ambulated before d/c  Final Clinical Impression(s) /  ED Diagnoses Final diagnoses:  None    Rx / DC Orders ED Discharge Orders     None        Franchot Heidelberg, PA-C 08/06/21 [place], [place], DO 08/07/21 [phone]

## 2021-08-08 ENCOUNTER — Other Ambulatory Visit: Payer: Self-pay | Admitting: Cardiology

## 2021-08-08 LAB — URINE CULTURE

## 2021-08-08 NOTE — Telephone Encounter (Signed)
Prescription refill request for Eliquis received. Indication:Afib Last office visit:6/22 Scr:0.8 Age: 80 Weight:58 kg  Prescription refilled

## 2021-08-19 ENCOUNTER — Encounter: Payer: Self-pay | Admitting: Cardiology

## 2021-08-19 ENCOUNTER — Other Ambulatory Visit: Payer: Self-pay

## 2021-08-19 ENCOUNTER — Ambulatory Visit: Payer: Medicare PPO | Admitting: Cardiology

## 2021-08-19 VITALS — BP 124/62 | HR 54 | Ht 60.0 in | Wt 119.2 lb

## 2021-08-19 DIAGNOSIS — I10 Essential (primary) hypertension: Secondary | ICD-10-CM

## 2021-08-19 DIAGNOSIS — I480 Paroxysmal atrial fibrillation: Secondary | ICD-10-CM

## 2021-08-19 DIAGNOSIS — R600 Localized edema: Secondary | ICD-10-CM

## 2021-08-19 DIAGNOSIS — E7800 Pure hypercholesterolemia, unspecified: Secondary | ICD-10-CM

## 2021-08-19 DIAGNOSIS — Z7901 Long term (current) use of anticoagulants: Secondary | ICD-10-CM

## 2021-08-19 MED ORDER — FUROSEMIDE 20 MG PO TABS: 20.0000 mg | ORAL_TABLET | Freq: Every day | ORAL | 11 refills | Status: DC

## 2021-08-19 NOTE — Patient Instructions (Addendum)
Medication Instructions:    Changed furosemide 20 mg dose. Take a tablet 20 mg daily  if you have weight gain of 3 lbs overnight or swelling you may take an additional 20 mg tablet midday to late afternoon.  *If you need a refill on your cardiac medications before your next appointment, please call your pharmacy*   Lab Work:  Not needed   Testing/Procedures:  Not needed  Follow-Up: At [facility], you and your health needs are our priority.  As part of our continuing mission to provide you with exceptional heart care, we have created designated Provider Care Teams.  These Care Teams include your primary Cardiologist (physician) and Advanced Practice Providers (APPs -  Physician Assistants and Nurse Practitioners) who all work together to provide you with the care you need, when you need it.     Your next appointment:   6 month(s)  The format for your next appointment:   In Person  Provider:   6 month with APP-  K. Lawrence NP   Bryan Lemma, MD    Other Instructions   Wear support /compression  hose or socks - during the day Elevate you  legs during the day

## 2021-08-19 NOTE — Progress Notes (Signed)
Primary Care Provider: Horald Pollen, MD Cardiologist: Glenetta Hew, MD Electrophysiologist: None  Clinic Note: Chief Complaint  Patient presents with   Follow-up    6 months   Atrial Fibrillation    Seems to be maintaining sinus rhythm.  No symptoms.   ===================================  ASSESSMENT/PLAN   Problem List Items Addressed This Visit       Cardiology Problems   Paroxysmal atrial fibrillation (HCC) - CHA2DS2-VASc Score 3, on Eliquis - Primary (Chronic)    Seems to be well controlled on standing dose of Toprol (metoprolol succinate) and flecainide.  Not requiring additional doses of Lopressor (metoprolol tartrate). Remains on Eliquis for anticoagulation with no bleeding issues. Need to monitor renal function and weight to ensure that she is on stable dose.      Relevant Medications   furosemide (LASIX) 20 MG tablet   Essential hypertension (Chronic)    Stable blood pressure.  No major issues.  Continue current meds      Relevant Medications   furosemide (LASIX) 20 MG tablet   Hypercholesteremia (Chronic)    Has not had lipid panel since August 2021.  Should be due to get 1 checked by PCP soon.  If not we can recheck in follow-up.  Otherwise remains on rosuvastatin.  Labs have been pretty well controlled in the past.      Relevant Medications   furosemide (LASIX) 20 MG tablet     Other   Long term current use of anticoagulant therapy (Chronic)    Currently on the correct dose for Eliquis 5 mg twice daily.  No bleeding issues.  Okay to hold 2 to 3 days preop for any surgeries or procedures.      Bilateral lower extremity edema (Chronic)    Stable, likely related to venous stasis over CHF.  Continue to recommend support stockings.  Also foot elevation  Will change Lasix to 20 mg daily and able to take additional dose of 20 mg midday-Late afternoon hold additional versus weight gain of > 3 pounds.        ===================================  HPI:    Donna Simmons is a 80 y.o. female with a PMH notable for PAF (on Eliquis and flecainide/metoprolol), Chronic LE Swelling (Venous STasis / Lymphedema), frequent PACs, HTN, HLD Coronary Calcium Score 26 who presents today for 45-month follow-up.  I last saw Donna Simmons was last seen on August 07, 2020 for routine follow-up.  She is not a very good historian.  No real significant symptoms.  No exertional dyspnea.  No significant palpitations or irregular heartbeats. ->  Recommended taking Lasix Monday Wednesday and Friday and as needed.  She was seen by Jory Sims, NP on February 08, 2021 for ER follow-up.  She had had some lower extremity edema with open wounds of both lower extremities.  Diagnosed with cellulitis.  Was being treated by wound care.  Was told to take furosemide 20 mg daily for 5 days.  Also treated with Keflex.  No other cardiac complaints.  Recent Hospitalizations:  12/6-03/2021:  [facility] - mechanical fall.    Reviewed  CV studies:    The following studies were reviewed today: (if available, images/films reviewed: From Epic Chart or Care Everywhere) none:  Interval History:   Donna Simmons presents today for follow-up.  Most notable issue remains her extremity edema.  She off-and-on takes additional dose of Lasix occasionally berry Strader for stockings.  She is not necessarily having any sensation of irregular  heartbeats or palpitations-has not had to use any additional dose of either flecainide or metoprolol tartrate..  No real PND or pain to go along with edema.  No chest pain or pressure.  She does get lightheaded and dizzy on occasion but no syncope or near syncope.  CV Review of Symptoms (Summary) Cardiovascular ROS: positive for - edema and rare occasional palpitations negative for - chest pain, dyspnea on exertion, orthopnea, paroxysmal nocturnal dyspnea, rapid heart rate, shortness of breath, or  syncope/near syncope or TIA/amaurosis fugax, claudication  REVIEWED OF SYSTEMS   Review of Systems  Constitutional:  Positive for weight loss. Negative for malaise/fatigue.  Respiratory:  Negative for cough and shortness of breath.   Cardiovascular:  Positive for leg swelling (stable - goes down wiht elevation; says tht she does wear support stockings).  Gastrointestinal:  Negative for blood in stool and melena.  Genitourinary:  Negative for hematuria.  Musculoskeletal:  Negative for joint pain.  Neurological:  Positive for weakness (Generalized). Negative for dizziness and focal weakness.  Psychiatric/Behavioral:  Positive for memory loss. Negative for depression. The patient is not nervous/anxious and does not have insomnia.    I have reviewed and (if needed) personally updated the patient's problem list, medications, allergies, past medical and surgical history, social and family history.   PAST MEDICAL HISTORY   Past Medical History:  Diagnosis Date   Allergy    GERD (gastroesophageal reflux disease)    Heart murmur    No significant valvular lesion noted on echo.   Hypercholesteremia    Hypertension    Memory loss    Paroxysmal atrial fibrillation ([place]) 12/27/2008    PAST SURGICAL HISTORY   Past Surgical History:  Procedure Laterality Date   ABDOMINAL HYSTERECTOMY  1980   BREAST SURGERY     CT CTA CORONARY W/CA SCORE W/CM &/OR WO/CM  08/2018   Coronary calcium score 24.  Nonobstructive CAD with less than 30% plaque in proximal LAD.  Normal aortic root. ->  Relook in October 2020 had significant motion artifact but coronary calcium score 26.   EYE SURGERY Right 12/29/2016   EYE SURGERY Left 01/19/2017   FRACTURE SURGERY     LEFT HEART CATH AND CORONARY ANGIOGRAPHY  12/16/2010   no evidence of CAD to explain anginal pain w/ positive troponin.  potential etiology is breakthrough AF   NM MYOVIEW LTD  April 2010   EF 64%, normal pattern of perfusion in all regions, no  scintigraphic evidence of inducible ischemia; no significant wall motion abnormalities; EKG negative for ischemia; no significant change from last study; low risk scan   TRANSTHORACIC ECHOCARDIOGRAM  07/2018   Normal EF 55-60%.  No RWMA. GR 1 DD. Ao Sclerosis - no Stenosis. Mild AI. Trivial MR.     Immunization History  Administered Date(s) Administered   Fluad Quad(high Dose 65+) 06/06/2019   Hepatitis A 03/22/1998   Influenza Split 08/02/2012, 06/20/2014   Influenza,inj,Quad PF,6+ Mos 07/21/2013, 06/04/2015, 06/21/2016, 05/21/2017, 04/26/2018   Influenza-Unspecified 06/01/2014   PPD Test 04/26/2013   Pneumococcal Conjugate-13 08/29/2015   Pneumococcal Polysaccharide-23 09/01/2005   Td 03/22/1998, 07/31/2010   Tdap 09/01/2009   Zoster, Live 08/02/2012    MEDICATIONS/ALLERGIES   Current Meds  Medication Sig   acetaminophen (TYLENOL) 325 MG tablet Take 325-650 mg by mouth daily as needed (pain or headaches).    amLODipine (NORVASC) 10 MG tablet Take 10 mg by mouth daily.   apixaban (ELIQUIS) 5 MG TABS tablet Take 1 tablet by mouth twice  daily   Blood Pressure Monitoring (BLOOD PRESSURE CUFF) MISC Take your  blood pressure with blood pressure cuff/moniotor- can be automatic or manual - one to three  times a week as needed   cephALEXin (KEFLEX) 500 MG capsule Take 1 capsule (500 mg total) by mouth 4 (four) times daily.   ENSURE (ENSURE) Take 237 mLs by mouth daily.   flecainide (TAMBOCOR) 50 MG tablet TAKE 1 TABLET BY MOUTH TWICE DAILY . APPOINTMENT REQUIRED FOR FUTURE REFILLS   fluticasone (FLONASE) 50 MCG/ACT nasal spray USE TWO SPRAY(S) IN EACH NOSTRIL ONCE DAILY (Patient taking differently: Place 1 spray into both nostrils daily.)   lisinopril (ZESTRIL) 20 MG tablet Take 1 tablet by mouth once daily   memantine (NAMENDA) 5 MG tablet Take 1 tablet daily for one week, then take 1 tablet twice daily for one week, then take 1 tablet in the morning and 2 in the evening for one week, then  take 2 tablets twice daily   metoprolol succinate (TOPROL-XL) 50 MG 24 hr tablet TAKE 1 TABLET BY MOUTH ONCE DAILY WITH OR IMMEDIATELY FOLLOWING A MEAL   metoprolol tartrate (LOPRESSOR) 50 MG tablet Take 1 tablet only with flecainide as needed for rapid fast heart rate   Multiple Vitamins-Minerals (CENTRUM SILVER 50+WOMEN) TABS Take 1 tablet by mouth daily with lunch.   omeprazole (PRILOSEC) 40 MG capsule    rosuvastatin (CRESTOR) 20 MG tablet Take 1 tablet by mouth once daily   [DISCONTINUED] furosemide (LASIX) 20 MG tablet Take 1 tablet (20 mg total) by mouth daily. Take daily for 5 days then continue medication per Primary Care provider guidance.    Allergies  Allergen Reactions   Antihistamines, Chlorpheniramine-Type Palpitations    Makes heart beat fast    SOCIAL HISTORY/FAMILY HISTORY   Reviewed in Epic:  Pertinent findings:  Social History   Tobacco Use   Smoking status: Never   Smokeless tobacco: Never  Vaping Use   Vaping Use: Never used  Substance Use Topics   Alcohol use: No    Alcohol/week: 0.0 standard drinks   Drug use: No   Social History   Social History Narrative   Married with no children. Walks several times a week at least 3-4 times a week about 2 miles a time. Does not smoke and does not drink.    OBJCTIVE -PE, EKG, labs   Wt Readings from Last 3 Encounters:  08/19/21 119 lb 3.2 oz (54.1 kg)  07/01/21 127 lb 13.9 oz (58 kg)  02/08/21 127 lb (57.6 kg)    Physical Exam: BP 124/62    Pulse (!) 54    Ht 5' (1.524 m)    Wt 119 lb 3.2 oz (54.1 kg)    SpO2 99%    BMI 23.28 kg/m  Physical Exam Vitals reviewed.  Constitutional:      General: She is not in acute distress.    Appearance: Normal appearance. She is normal weight. She is not toxic-appearing.  HENT:     Head: Normocephalic and atraumatic.  Neck:     Vascular: No carotid bruit (Radiated aortic murmur) or JVD.  Cardiovascular:     Rate and Rhythm: Normal rate and regular rhythm.  Occasional Extrasystoles are present.    Chest Wall: PMI is not displaced.     Pulses: Decreased pulses (Mildly decreased due to pedal edema but otherwise palpable).     Heart sounds: S1 normal and S2 normal. Heart sounds are distant. Murmur (1/6 SEM at RUSB.) heard.  No friction rub. No gallop.  Pulmonary:     Effort: Pulmonary effort is normal. No respiratory distress.     Breath sounds: Normal breath sounds.  Chest:     Chest wall: No tenderness.  Musculoskeletal:        General: Swelling present.     Cervical back: Normal range of motion and neck supple.     Comments: Bilateral 1-2+ edema, spider veins and varicose veins.  Skin:    General: Skin is warm and dry.  Neurological:     General: No focal deficit present.     Mental Status: She is alert.     Gait: Gait normal.     Comments: Mildly confused.  Poor historian.  Psychiatric:        Mood and Affect: Mood normal.        Behavior: Behavior normal.     Comments: Somewhat confused, poor historian     Adult ECG Report Not checked  From ER visit 08/06/2021 -> Sinus bradycardia-50 bpm; borderline low voltage in extremity leads.  Biphasic ST changes in anterolateral leads with baseline wander in V2.  Borderline EKG.  Recent Labs: Reviewed.  Has not had recent lipid panel.. Lab Results  Component Value Date   CHOL 157 04/04/2020   HDL 71 04/04/2020   LDLCALC 74 04/04/2020   TRIG 60 04/04/2020   CHOLHDL 2.2 04/04/2020   Lab Results  Component Value Date   CREATININE 0.80 08/06/2021   BUN 13 08/06/2021   NA 138 08/06/2021   K 3.5 08/06/2021   CL 106 08/06/2021   CO2 25 08/06/2021   CBC Latest Ref Rng & Units 08/06/2021 07/01/2021 10/22/2020  WBC 4.0 - 10.5 K/uL 3.9(L) 5.4 4.9  Hemoglobin 12.0 - 15.0 g/dL 10.4(L) 10.0(L) 11.6(L)  Hematocrit 36.0 - 46.0 % 33.0(L) 31.6(L) 37.1  Platelets 150 - 400 K/uL 173 182 183    Lab Results  Component Value Date   HGBA1C 5.3 04/04/2020   Lab Results  Component Value Date    TSH 1.160 04/04/2020    ==================================================  COVID-19 Education: The signs and symptoms of COVID-19 were discussed with the patient and how to seek care for testing (follow up with PCP or arrange E-visit).    I spent a total of 24 minutes with the patient spent in direct patient consultation.  Additional time spent with chart review  / charting (studies, outside notes, etc): 14 min Total Time: 38 min  Current medicines are reviewed at length with the patient today.  (+/- concerns) none  This visit occurred during the SARS-CoV-2 public health emergency.  Safety protocols were in place, including screening questions prior to the visit, additional usage of staff PPE, and extensive cleaning of exam room while observing appropriate contact time as indicated for disinfecting solutions.  Notice: This dictation was prepared with Dragon dictation along with smart phrase technology. Any transcriptional errors that result from this process are unintentional and may not be corrected upon review.  Studies Ordered:   No orders of the defined types were placed in this encounter.   Patient Instructions / Medication Changes & Studies & Tests Ordered   Patient Instructions  Medication Instructions:    Changed furosemide 20 mg dose. Take a tablet 20 mg daily  if you have weight gain of 3 lbs overnight or swelling you may take an additional 20 mg tablet midday to late afternoon.  *If you need a refill on your cardiac medications before your next appointment, please call  your pharmacy*   Lab Work:  Not needed   Testing/Procedures:  Not needed  Follow-Up: At [facility], you and your health needs are our priority.  As part of our continuing mission to provide you with exceptional heart care, we have created designated Provider Care Teams.  These Care Teams include your primary Cardiologist (physician) and Advanced Practice Providers (APPs -  Physician  Assistants and Nurse Practitioners) who all work together to provide you with the care you need, when you need it.     Your next appointment:   6 month(s)  The format for your next appointment:   In Person  Provider:   6 month with APP-  K. Lawrence NP   Glenetta Hew, MD    Other Instructions   Wear support /compression  hose or socks - during the day Elevate you  legs during the day       Glenetta Hew, M.D., M.S. Interventional Cardiologist   Pager # [phone] Phone # [phone] [address] [place], [place] [address]   Thank you for choosing Heartcare at [facility]!!

## 2021-09-02 ENCOUNTER — Encounter: Payer: Self-pay | Admitting: Cardiology

## 2021-09-02 NOTE — Assessment & Plan Note (Signed)
Currently on the correct dose for Eliquis 5 mg twice daily.  No bleeding issues.  Okay to hold 2 to 3 days preop for any surgeries or procedures.

## 2021-09-02 NOTE — Assessment & Plan Note (Signed)
Seems to be well controlled on standing dose of Toprol (metoprolol succinate) and flecainide.  Not requiring additional doses of Lopressor (metoprolol tartrate). Remains on Eliquis for anticoagulation with no bleeding issues. Need to monitor renal function and weight to ensure that she is on stable dose.

## 2021-09-02 NOTE — Assessment & Plan Note (Addendum)
Stable blood pressure.  No major issues.  Continue current meds

## 2021-09-02 NOTE — Assessment & Plan Note (Addendum)
Stable, likely related to venous stasis over CHF.  Continue to recommend support stockings.  Also foot elevation  Will change Lasix to 20 mg daily and able to take additional dose of 20 mg midday-Late afternoon hold additional versus weight gain of > 3 pounds.

## 2021-09-02 NOTE — Assessment & Plan Note (Signed)
Has not had lipid panel since August 2021.  Should be due to get 1 checked by PCP soon.  If not we can recheck in follow-up.  Otherwise remains on rosuvastatin.  Labs have been pretty well controlled in the past.

## 2021-09-23 ENCOUNTER — Encounter (HOSPITAL_COMMUNITY): Payer: Self-pay | Admitting: Emergency Medicine

## 2021-09-23 ENCOUNTER — Emergency Department (HOSPITAL_COMMUNITY)
Admission: EM | Admit: 2021-09-23 | Discharge: 2021-09-23 | Disposition: A | Discharge status: Home / Self Care | Payer: Medicare PPO | Attending: Emergency Medicine | Admitting: Emergency Medicine

## 2021-09-23 ENCOUNTER — Other Ambulatory Visit: Payer: Self-pay

## 2021-09-23 DIAGNOSIS — W19XXXA Unspecified fall, initial encounter: Secondary | ICD-10-CM | POA: Insufficient documentation

## 2021-09-23 DIAGNOSIS — I10 Essential (primary) hypertension: Secondary | ICD-10-CM | POA: Insufficient documentation

## 2021-09-23 DIAGNOSIS — R350 Frequency of micturition: Secondary | ICD-10-CM | POA: Insufficient documentation

## 2021-09-23 DIAGNOSIS — E876 Hypokalemia: Secondary | ICD-10-CM | POA: Insufficient documentation

## 2021-09-23 DIAGNOSIS — R600 Localized edema: Secondary | ICD-10-CM | POA: Diagnosis not present

## 2021-09-23 DIAGNOSIS — F0390 Unspecified dementia without behavioral disturbance: Secondary | ICD-10-CM | POA: Insufficient documentation

## 2021-09-23 DIAGNOSIS — I4891 Unspecified atrial fibrillation: Secondary | ICD-10-CM | POA: Insufficient documentation

## 2021-09-23 DIAGNOSIS — Z79899 Other long term (current) drug therapy: Secondary | ICD-10-CM | POA: Insufficient documentation

## 2021-09-23 DIAGNOSIS — Z7901 Long term (current) use of anticoagulants: Secondary | ICD-10-CM | POA: Diagnosis not present

## 2021-09-23 LAB — URINALYSIS, ROUTINE W REFLEX MICROSCOPIC
Bilirubin Urine: NEGATIVE
Glucose, UA: NEGATIVE mg/dL
Ketones, ur: NEGATIVE mg/dL
Leukocytes,Ua: NEGATIVE
Nitrite: NEGATIVE
Protein, ur: 30 mg/dL — AB
Specific Gravity, Urine: 1.019 (ref 1.005–1.030)
pH: 6 (ref 5.0–8.0)

## 2021-09-23 LAB — COMPREHENSIVE METABOLIC PANEL
ALT: 16 U/L (ref 0–44)
AST: 19 U/L (ref 15–41)
Albumin: 3.3 g/dL — ABNORMAL LOW (ref 3.5–5.0)
Alkaline Phosphatase: 47 U/L (ref 38–126)
Anion gap: 5 (ref 5–15)
BUN: 18 mg/dL (ref 8–23)
CO2: 27 mmol/L (ref 22–32)
Calcium: 8.7 mg/dL — ABNORMAL LOW (ref 8.9–10.3)
Chloride: 108 mmol/L (ref 98–111)
Creatinine, Ser: 0.98 mg/dL (ref 0.44–1.00)
GFR, Estimated: 59 mL/min — ABNORMAL LOW (ref >60)
Glucose, Bld: 88 mg/dL (ref 70–99)
Potassium: 3.1 mmol/L — ABNORMAL LOW (ref 3.5–5.1)
Sodium: 140 mmol/L (ref 135–145)
Total Bilirubin: 0.7 mg/dL (ref 0.3–1.2)
Total Protein: 6.3 g/dL — ABNORMAL LOW (ref 6.5–8.1)

## 2021-09-23 LAB — CBC WITH DIFFERENTIAL/PLATELET
Abs Immature Granulocytes: 0 10*3/uL (ref 0.00–0.07)
Basophils Absolute: 0 10*3/uL (ref 0.0–0.1)
Basophils Relative: 0 %
Eosinophils Absolute: 0 10*3/uL (ref 0.0–0.5)
Eosinophils Relative: 0 %
HCT: 31 % — ABNORMAL LOW (ref 36.0–46.0)
Hemoglobin: 9.9 g/dL — ABNORMAL LOW (ref 12.0–15.0)
Immature Granulocytes: 0 %
Lymphocytes Relative: 16 %
Lymphs Abs: 0.9 10*3/uL (ref 0.7–4.0)
MCH: 26.5 pg (ref 26.0–34.0)
MCHC: 31.9 g/dL (ref 30.0–36.0)
MCV: 83.1 fL (ref 80.0–100.0)
Monocytes Absolute: 0.3 10*3/uL (ref 0.1–1.0)
Monocytes Relative: 6 %
Neutro Abs: 4.3 10*3/uL (ref 1.7–7.7)
Neutrophils Relative %: 78 %
Platelets: 187 10*3/uL (ref 150–400)
RBC: 3.73 MIL/uL — ABNORMAL LOW (ref 3.87–5.11)
RDW: 15.7 % — ABNORMAL HIGH (ref 11.5–15.5)
WBC: 5.5 10*3/uL (ref 4.0–10.5)
nRBC: 0 % (ref 0.0–0.2)

## 2021-09-23 NOTE — Progress Notes (Addendum)
.  Transition of Care [facility]) - Emergency Department Mini Assessment   Patient Details  Name: Donna Simmons MRN: [id number] Date of Birth: [birth year]  Transition of Care [facility]) CM/SW Contact:    Illene Regulus, LCSW Phone Number: 09/23/2021, 12:29 PM   Clinical Narrative:  TOC CSW received consult due to unable to contact pt's family.CSW attempted to call pt's spouse Revan Strojny [phone]) no response unable to leave VM.   CSW contact Non Emergency police requested welfare check on pt's home. TOC continue to follow.    12:41pm CSW received call from pt spouse he stated he is on the way to pick pt up , however is requesting someone to bring her out to the car , as he has trouble with ambulating.   ED Mini Assessment: What brought you to the Emergency Department? : Urinary Frequency, Fall  Barriers to Discharge: ED Unable to Make Contact with Facility/Family        Interventions which prevented an admission or readmission: Transportation Screening    Patient Contact and Communications        ,                 Admission diagnosis:  fall Patient Active Problem List   Diagnosis Date Noted   Bilateral lower extremity edema 08/19/2020   Other forms of angina pectoris ([place]) 07/25/2018   Long term current use of anticoagulant therapy 11/18/2012   Hypercholesteremia 09/25/2012   DDD (degenerative disc disease), cervical 05/19/2012   Paroxysmal atrial fibrillation ([place]) - CHA2DS2-VASc Score 3, on Eliquis 04/20/2012   Essential hypertension 04/20/2012   PCP:  Horald Pollen, MD Pharmacy:   [place] [place] [facility] [address] [place] [address] Phone: [phone] Fax: [phone]

## 2021-09-23 NOTE — ED Provider Notes (Signed)
[facility] Provider Note   CSN: [id number] Arrival date & time: 09/23/21  [id number]     History  Chief Complaint  Patient presents with   Urinary Frequency   Fall    Gerald LeitzMary P Mayford KnifeWilliams is a 81 y.o. female.  Patient is a 81 year old female with a history of dementia, paroxysmal atrial fibrillation on Tambocor and Eliquis, GERD, hypertension who is presenting today from home via EMS for a fall that occurred yesterday and urinary frequency.  Patient is a limited historian due to her memory issues but she reports that she has no pain and did not know that she fell yesterday.  She does report urinating constantly but does not know why.  She denies any abdominal pain at this time or pain with urination.  She does report she still taking her medications.  She does not know how long she has had the swelling in her legs but has no other complaints at this time.  Attempted to contact patient's husband who is not available by phone at this time.  However report received from EMS was that when she fell she did not hit her head or lose consciousness.  She has not had change in her mental status.  The history is provided by the patient, the EMS personnel and medical records. The history is limited by the absence of a caregiver.  Urinary Frequency  Fall      Home Medications Prior to Admission medications   Medication Sig Start Date End Date Taking? Authorizing Provider  acetaminophen (TYLENOL) 325 MG tablet Take 325-650 mg by mouth daily as needed (pain or headaches).     [provider]  amLODipine (NORVASC) 10 MG tablet Take 10 mg by mouth daily. 09/14/17   [provider]  apixaban Everlene Balls(ELIQUIS) 5 MG TABS tablet Take 1 tablet by mouth twice daily 08/08/21   Marykay LexHarding, David W, MD  Blood Pressure Monitoring (BLOOD PRESSURE CUFF) MISC Take your  blood pressure with blood pressure cuff/moniotor- can be automatic or manual - one to three  times a week as  needed 04/20/19   Marykay LexHarding, David W, MD  cephALEXin (KEFLEX) 500 MG capsule Take 1 capsule (500 mg total) by mouth 4 (four) times daily. 08/06/21   Theron AristaSage, Haley, PA-C  ENSURE (ENSURE) Take 237 mLs by mouth daily.    [provider]  flecainide (TAMBOCOR) 50 MG tablet TAKE 1 TABLET BY MOUTH TWICE DAILY . APPOINTMENT REQUIRED FOR FUTURE REFILLS 01/14/21   Marykay LexHarding, David W, MD  fluticasone [facility](FLONASE) 50 MCG/ACT nasal spray USE TWO SPRAY(S) IN EACH NOSTRIL ONCE DAILY Patient taking differently: Place 1 spray into both nostrils daily. 01/05/17   Shade FloodGreene, Jeffrey R, MD  furosemide (LASIX) 20 MG tablet Take 1 tablet (20 mg total) by mouth daily. May take an additional 20 mg if weight gain or swelling of 3 lbs overnight 08/19/21   Marykay LexHarding, David W, MD  lisinopril (ZESTRIL) 20 MG tablet Take 1 tablet by mouth once daily 08/14/20   Marykay LexHarding, David W, MD  memantine [facility](NAMENDA) 5 MG tablet Take 1 tablet daily for one week, then take 1 tablet twice daily for one week, then take 1 tablet in the morning and 2 in the evening for one week, then take 2 tablets twice daily 07/27/19   York SpanielWillis, Charles K, MD  metoprolol succinate (TOPROL-XL) 50 MG 24 hr tablet TAKE 1 TABLET BY MOUTH ONCE DAILY WITH OR IMMEDIATELY FOLLOWING A MEAL 04/02/21   Marykay LexHarding, David W, MD  metoprolol tartrate (LOPRESSOR) 50 MG tablet Take 1 tablet only with flecainide as needed for rapid fast heart rate 08/08/19   Marykay Lex, MD  Multiple Vitamins-Minerals (CENTRUM SILVER 50+WOMEN) TABS Take 1 tablet by mouth daily with lunch.    [provider]  MYRBETRIQ 25 MG TB24 tablet Take 25 mg by mouth daily. Patient not taking: Reported on 08/19/2021 09/15/20   [provider]  omeprazole (PRILOSEC) 40 MG capsule     [provider]  rosuvastatin (CRESTOR) 20 MG tablet Take 1 tablet by mouth once daily 08/08/20   Marykay Lex, MD  sucralfate (CARAFATE) 1 GM/10ML suspension Take 10 mLs (1 g total) by mouth 4 (four) times daily -   with meals and at bedtime. Patient not taking: Reported on 08/19/2021 01/05/18   [name], MD      Allergies    Antihistamines, chlorpheniramine-type    Review of Systems   Review of Systems  Genitourinary:  Positive for frequency.   Physical Exam Updated Vital Signs BP 128/65    Pulse (!) 56    Temp (!) 97.5 F (36.4 C)    Resp 15    SpO2 98%  Physical Exam Vitals and nursing note reviewed.  Constitutional:      General: She is not in acute distress.    Appearance: Normal appearance. She is well-developed.  HENT:     Head: Normocephalic and atraumatic.     Nose: Nose normal.     Mouth/Throat:     Mouth: Mucous membranes are moist.  Eyes:     Pupils: Pupils are equal, round, and reactive to light.  Cardiovascular:     Rate and Rhythm: Normal rate and regular rhythm.     Heart sounds: Normal heart sounds. No murmur heard.   No friction rub.  Pulmonary:     Effort: Pulmonary effort is normal.     Breath sounds: Normal breath sounds. No wheezing or rales.  Abdominal:     General: Bowel sounds are normal. There is no distension.     Palpations: Abdomen is soft.     Tenderness: There is no abdominal tenderness. There is no guarding or rebound.  Musculoskeletal:        General: No tenderness. Normal range of motion.     Right lower leg: Edema present.     Left lower leg: Edema present.     Comments: 2+ pitting edema in bilateral lower extremities between the ankle and the knee.  No open wounds present at this time mild erythema bilaterally consistent with chronic venous stasis.  Skin:    General: Skin is warm and dry.     Findings: No rash.  Neurological:     Mental Status: She is alert. Mental status is at baseline.     Cranial Nerves: No cranial nerve deficit.     Sensory: No sensory deficit.     Motor: No weakness.     Comments: Oriented to person and able to follow all commands  Psychiatric:        Mood and Affect: Mood normal.        Behavior: Behavior  normal.    ED Results / Procedures / Treatments   Labs (all labs ordered are listed, but only abnormal results are displayed) Labs Reviewed  URINALYSIS, ROUTINE W REFLEX MICROSCOPIC - Abnormal; Notable for the following components:      Result Value   Hgb urine dipstick MODERATE (*)    Protein, ur 30 (*)  Bacteria, UA RARE (*)    All other components within normal limits  CBC WITH DIFFERENTIAL/PLATELET - Abnormal; Notable for the following components:   RBC 3.73 (*)    Hemoglobin 9.9 (*)    HCT 31.0 (*)    RDW 15.7 (*)    All other components within normal limits  COMPREHENSIVE METABOLIC PANEL - Abnormal; Notable for the following components:   Potassium 3.1 (*)    Calcium 8.7 (*)    Total Protein 6.3 (*)    Albumin 3.3 (*)    GFR, Estimated 59 (*)    All other components within normal limits    EKG None  Radiology No results found.  Procedures Procedures    Medications Ordered in ED Medications - No data to display  ED Course/ Medical Decision Making/ A&P                           Medical Decision Making Amount and/or Complexity of Data Reviewed Independent Historian: EMS External Data Reviewed: notes. Labs: ordered. Decision-making details documented in ED Course.   Pleasant elderly female presenting today with complaint of urinary frequency.  Patient does take Lasix and based on prior review of external medical records she has had recurrent issues with urinary frequency.  There was a report that she fell yesterday per EMS but did not injure herself.  Unclear exactly why EMS was called today feel that is most likely related to the urinary symptoms.  Attempted to contact patient's husband but he is not available at by phone at this time.  Patient's vital signs are reassuring today, exam without any localized areas of pain.  Patient does have evidence of edema in bilateral lower extremities but this is not a new finding based on prior medical record review.  We  will attempt to contact husband for further information.  Labs pending to ensure no worsening renal function, UTI.  Patient does take Eliquis but there was no report of head injury or loss of consciousness or change in mental status.  Will confirm with husband.  9:54 AM I have independently evaluated patient's labs today and her CMP shows mild hypokalemia 3.1 but otherwise normal, CBC with mild decrease in hemoglobin to 9.9 from 10.4 that will need to be monitored but no acute intervention is required today, UA has moderate hemoglobin but it was a cath sample with no signs of infection today.  Patient's vital signs remained stable.  She continues to look well-appearing.  Feel that patient is stable for discharge home today however unable to contact patient's husband.  We will continue to try to get a hold of patient's family so that she can be discharged home.  Other than getting a hold of one of her caregivers she does not appear to have social barriers preventing her discharge.  Considered admission but at this time she does not meet criteria.        Final Clinical Impression(s) / ED Diagnoses Final diagnoses:  Urinary frequency  Fall, initial encounter    Rx / DC Orders ED Discharge Orders     None         Gwyneth Sprout, MD 09/23/21 [phone]

## 2021-09-23 NOTE — ED Notes (Signed)
Attempted to call pt husband, no answer.

## 2021-09-23 NOTE — Discharge Instructions (Signed)
There is no sign of a urinary tract infection today.  Blood pressure and heart rate are good.  Kidneys are working well today and blood counts are still looking good.  It will be important for her to follow-up with her regular doctor in the next few weeks to have labs rechecked to make sure there is no changes.  The urinating frequently may be coming from some of the medication she has to take for her heart.  To help improve the swelling in her lower extremities she should be wearing compression socks.

## 2021-09-23 NOTE — ED Triage Notes (Signed)
BIBA Per EMS: Pt coming from home w/ c/o urinary frequency that began a few days ago. Pt denies burning w/ urination. Pt does take lasix. Pt also fell yesterday. Denies hitting head, denies LOC. No trauma or pain. Hx HTN. Hx dementia  114/62  96% RA 64 HR 112 CBG

## 2021-09-23 NOTE — ED Notes (Signed)
Pure wick placed on pt for urine collection.

## 2021-10-14 ENCOUNTER — Encounter (HOSPITAL_COMMUNITY): Payer: Self-pay

## 2021-10-14 ENCOUNTER — Other Ambulatory Visit: Payer: Self-pay

## 2021-10-14 ENCOUNTER — Observation Stay (HOSPITAL_COMMUNITY)
Admission: EM | Admit: 2021-10-14 | Discharge: 2021-10-18 | Disposition: A | Discharge status: Skilled Nursing Facility | Payer: Medicare PPO | Attending: Internal Medicine | Admitting: Internal Medicine

## 2021-10-14 ENCOUNTER — Emergency Department (HOSPITAL_COMMUNITY): Payer: Medicare PPO

## 2021-10-14 DIAGNOSIS — I10 Essential (primary) hypertension: Secondary | ICD-10-CM | POA: Insufficient documentation

## 2021-10-14 DIAGNOSIS — E7800 Pure hypercholesterolemia, unspecified: Secondary | ICD-10-CM | POA: Diagnosis not present

## 2021-10-14 DIAGNOSIS — R531 Weakness: Secondary | ICD-10-CM

## 2021-10-14 DIAGNOSIS — I6782 Cerebral ischemia: Secondary | ICD-10-CM | POA: Insufficient documentation

## 2021-10-14 DIAGNOSIS — Z20822 Contact with and (suspected) exposure to covid-19: Secondary | ICD-10-CM | POA: Insufficient documentation

## 2021-10-14 DIAGNOSIS — E785 Hyperlipidemia, unspecified: Secondary | ICD-10-CM | POA: Diagnosis not present

## 2021-10-14 DIAGNOSIS — M6281 Muscle weakness (generalized): Principal | ICD-10-CM | POA: Insufficient documentation

## 2021-10-14 DIAGNOSIS — Z79899 Other long term (current) drug therapy: Secondary | ICD-10-CM | POA: Insufficient documentation

## 2021-10-14 DIAGNOSIS — K219 Gastro-esophageal reflux disease without esophagitis: Secondary | ICD-10-CM | POA: Diagnosis not present

## 2021-10-14 DIAGNOSIS — I480 Paroxysmal atrial fibrillation: Secondary | ICD-10-CM | POA: Diagnosis not present

## 2021-10-14 LAB — CBC WITH DIFFERENTIAL/PLATELET
Abs Immature Granulocytes: 0.01 10*3/uL (ref 0.00–0.07)
Basophils Absolute: 0 10*3/uL (ref 0.0–0.1)
Basophils Relative: 0 %
Eosinophils Absolute: 0 10*3/uL (ref 0.0–0.5)
Eosinophils Relative: 0 %
HCT: 34.3 % — ABNORMAL LOW (ref 36.0–46.0)
Hemoglobin: 10.8 g/dL — ABNORMAL LOW (ref 12.0–15.0)
Immature Granulocytes: 0 %
Lymphocytes Relative: 14 %
Lymphs Abs: 0.8 10*3/uL (ref 0.7–4.0)
MCH: 26.1 pg (ref 26.0–34.0)
MCHC: 31.5 g/dL (ref 30.0–36.0)
MCV: 82.9 fL (ref 80.0–100.0)
Monocytes Absolute: 0.3 10*3/uL (ref 0.1–1.0)
Monocytes Relative: 5 %
Neutro Abs: 4.5 10*3/uL (ref 1.7–7.7)
Neutrophils Relative %: 81 %
Platelets: 207 10*3/uL (ref 150–400)
RBC: 4.14 MIL/uL (ref 3.87–5.11)
RDW: 15.7 % — ABNORMAL HIGH (ref 11.5–15.5)
WBC: 5.7 10*3/uL (ref 4.0–10.5)
nRBC: 0 % (ref 0.0–0.2)

## 2021-10-14 LAB — RESP PANEL BY RT-PCR (FLU A&B, COVID) ARPGX2
Influenza A by PCR: NEGATIVE
Influenza B by PCR: NEGATIVE
SARS Coronavirus 2 by RT PCR: NEGATIVE

## 2021-10-14 LAB — URINALYSIS, ROUTINE W REFLEX MICROSCOPIC
Bacteria, UA: NONE SEEN
Bilirubin Urine: NEGATIVE
Glucose, UA: NEGATIVE mg/dL
Ketones, ur: NEGATIVE mg/dL
Nitrite: NEGATIVE
Protein, ur: 100 mg/dL — AB
Specific Gravity, Urine: 1.021 (ref 1.005–1.030)
pH: 6 (ref 5.0–8.0)

## 2021-10-14 LAB — BASIC METABOLIC PANEL
Anion gap: 4 — ABNORMAL LOW (ref 5–15)
BUN: 14 mg/dL (ref 8–23)
CO2: 28 mmol/L (ref 22–32)
Calcium: 9 mg/dL (ref 8.9–10.3)
Chloride: 108 mmol/L (ref 98–111)
Creatinine, Ser: 0.77 mg/dL (ref 0.44–1.00)
GFR, Estimated: >60 mL/min (ref >60)
Glucose, Bld: 91 mg/dL (ref 70–99)
Potassium: 3.6 mmol/L (ref 3.5–5.1)
Sodium: 140 mmol/L (ref 135–145)

## 2021-10-14 MED ORDER — ACETAMINOPHEN 325 MG PO TABS: 650.0000 mg | ORAL_TABLET | Freq: Four times a day (QID) | ORAL | Status: DC | PRN

## 2021-10-14 MED ORDER — ACETAMINOPHEN 650 MG RE SUPP: 650.0000 mg | Freq: Four times a day (QID) | RECTAL | Status: DC | PRN

## 2021-10-14 NOTE — ED Provider Triage Note (Signed)
Emergency Medicine Provider Triage Evaluation Note  Donna Simmons , a 81 y.o. female  was evaluated in triage.  Pt presents to the emergency department with decreased mobility per EMS.  Husband called EMS saying that she has been less mobile than normal and that he is unable to care for her.  Patient was ambulatory to gurney prior to arrival without difficulty.  She has no somatic complaints.  Review of Systems  Positive:  Negative: See above   Physical Exam  BP (!) 170/85    Pulse 68    Temp 97.8 F (36.6 C) (Oral)    Resp 16    SpO2 100%  Gen:   Awake, no distress   Resp:  Normal effort  MSK:   Moves extremities without difficulty  Other:    Medical Decision Making  Medically screening exam initiated at 6:43 PM.  Appropriate orders placed.  Donna Simmons was informed that the remainder of the evaluation will be completed by another provider, this initial triage assessment does not replace that evaluation, and the importance of remaining in the ED until their evaluation is complete.     Donna Simmons [place], [place] 10/14/21 1843

## 2021-10-14 NOTE — ED Triage Notes (Signed)
Per EMS- Patient is from home. Patient has a history of dementia. Patient's husband reports that the patient has had decreased mobility today. Patient walked out of the house and to the EMS truck without difficulty. Patient's husband reports that the patient can not take care of herself and he can't either.

## 2021-10-14 NOTE — ED Provider Notes (Signed)
[facility]-EMERGENCY DEPT Provider Note   CSN: [id number] Arrival date & time: 10/14/21  1822     History  No chief complaint on file.   Donna Simmons is a 81 y.o. female.  81 year old female with history of dementia presents with 1 day history of trouble ambulating.  According to her husband, patient usually walks unassisted.  No recent history of falls.  States that he could not get her to walk to the bathroom today.  Patient himself denies any complaints at this time.  She denies any focal weakness.  EMS called and patient transported here      Home Medications Prior to Admission medications   Medication Sig Start Date End Date Taking? Authorizing Provider  acetaminophen (TYLENOL) 325 MG tablet Take 325-650 mg by mouth daily as needed (pain or headaches).     [provider]  amLODipine (NORVASC) 10 MG tablet Take 10 mg by mouth daily. 09/14/17   [provider]  apixaban Everlene Balls) 5 MG TABS tablet Take 1 tablet by mouth twice daily 08/08/21   Marykay Lex, MD  Blood Pressure Monitoring (BLOOD PRESSURE CUFF) MISC Take your  blood pressure with blood pressure cuff/moniotor- can be automatic or manual - one to three  times a week as needed 04/20/19   Marykay Lex, MD  cephALEXin (KEFLEX) 500 MG capsule Take 1 capsule (500 mg total) by mouth 4 (four) times daily. 08/06/21   Theron Arista, PA-C  ENSURE (ENSURE) Take 237 mLs by mouth daily.    [provider]  flecainide (TAMBOCOR) 50 MG tablet TAKE 1 TABLET BY MOUTH TWICE DAILY . APPOINTMENT REQUIRED FOR FUTURE REFILLS 01/14/21   Marykay Lex, MD  fluticasone [facility]) 50 MCG/ACT nasal spray USE TWO SPRAY(S) IN EACH NOSTRIL ONCE DAILY Patient taking differently: Place 1 spray into both nostrils daily. 01/05/17   [name], MD  furosemide (LASIX) 20 MG tablet Take 1 tablet (20 mg total) by mouth daily. May take an additional 20 mg if weight gain or swelling of 3 lbs overnight  08/19/21   Marykay Lex, MD  lisinopril (ZESTRIL) 20 MG tablet Take 1 tablet by mouth once daily 08/14/20   Marykay Lex, MD  memantine [facility]) 5 MG tablet Take 1 tablet daily for one week, then take 1 tablet twice daily for one week, then take 1 tablet in the morning and 2 in the evening for one week, then take 2 tablets twice daily 07/27/19   York Spaniel, MD  metoprolol succinate (TOPROL-XL) 50 MG 24 hr tablet TAKE 1 TABLET BY MOUTH ONCE DAILY WITH OR IMMEDIATELY FOLLOWING A MEAL 04/02/21   Marykay Lex, MD  metoprolol tartrate (LOPRESSOR) 50 MG tablet Take 1 tablet only with flecainide as needed for rapid fast heart rate 08/08/19   Marykay Lex, MD  Multiple Vitamins-Minerals (CENTRUM SILVER 50+WOMEN) TABS Take 1 tablet by mouth daily with lunch.    [provider]  MYRBETRIQ 25 MG TB24 tablet Take 25 mg by mouth daily. Patient not taking: Reported on 08/19/2021 09/15/20   [provider]  omeprazole (PRILOSEC) 40 MG capsule     [provider]  rosuvastatin (CRESTOR) 20 MG tablet Take 1 tablet by mouth once daily 08/08/20   Marykay Lex, MD  sucralfate (CARAFATE) 1 GM/10ML suspension Take 10 mLs (1 g total) by mouth 4 (four) times daily -  with meals and at bedtime. Patient not taking: Reported on 08/19/2021 01/05/18  [name], MD      Allergies    Antihistamines, chlorpheniramine-type    Review of Systems   Review of Systems  All other systems reviewed and are negative.  Physical Exam Updated Vital Signs BP (!) 170/85    Pulse 68    Temp 97.8 F (36.6 C) (Oral)    Resp 16    SpO2 100%  Physical Exam Vitals and nursing note reviewed.  Constitutional:      General: She is not in acute distress.    Appearance: Normal appearance. She is well-developed. She is not toxic-appearing.  HENT:     Head: Normocephalic and atraumatic.  Eyes:     General: Lids are normal.     Conjunctiva/sclera: Conjunctivae normal.     Pupils:  Pupils are equal, round, and reactive to light.  Neck:     Thyroid: No thyroid mass.     Trachea: No tracheal deviation.  Cardiovascular:     Rate and Rhythm: Normal rate and regular rhythm.     Heart sounds: Normal heart sounds. No murmur heard.   No gallop.  Pulmonary:     Effort: Pulmonary effort is normal. No respiratory distress.     Breath sounds: Normal breath sounds. No stridor. No decreased breath sounds, wheezing, rhonchi or rales.  Abdominal:     General: There is no distension.     Palpations: Abdomen is soft.     Tenderness: There is no abdominal tenderness. There is no rebound.  Musculoskeletal:        General: No tenderness. Normal range of motion.     Cervical back: Normal range of motion and neck supple.  Lymphadenopathy:     Comments: 2+ bilateral lower extremity pitting edema  Skin:    General: Skin is warm and dry.     Findings: No abrasion or rash.  Neurological:     General: No focal deficit present.     Mental Status: She is alert and oriented to person, place, and time. Mental status is at baseline.     GCS: GCS eye subscore is 4. GCS verbal subscore is 5. GCS motor subscore is 6.     Cranial Nerves: No cranial nerve deficit.     Sensory: No sensory deficit.     Motor: Motor function is intact.  Psychiatric:        Attention and Perception: Attention normal.        Speech: Speech normal.        Behavior: Behavior normal.    ED Results / Procedures / Treatments   Labs (all labs ordered are listed, but only abnormal results are displayed) Labs Reviewed  BASIC METABOLIC PANEL - Abnormal; Notable for the following components:      Result Value   Anion gap 4 (*)    All other components within normal limits  URINALYSIS, ROUTINE W REFLEX MICROSCOPIC - Abnormal; Notable for the following components:   Hgb urine dipstick MODERATE (*)    Protein, ur 100 (*)    Leukocytes,Ua SMALL (*)    All other components within normal limits  CBC WITH  DIFFERENTIAL/PLATELET - Abnormal; Notable for the following components:   Hemoglobin 10.8 (*)    HCT 34.3 (*)    RDW 15.7 (*)    All other components within normal limits  CBC WITH DIFFERENTIAL/PLATELET    EKG None  Radiology No results found.  Procedures Procedures    Medications Ordered in ED Medications - No data to display  ED Course/ Medical Decision Making/ A&P                           Medical Decision Making Amount and/or Complexity of Data Reviewed Radiology: ordered.   Attempted to ambulate patient here unsuccessfully.  Spoke with patient's husband who states that up until 2 days ago patient can ambulate without assistance.  Head CT without acute findings here.  Urinalysis was may be slight infection.  Patient's differential includes stroke which would not be so head CT and she will therefore require MRI.  Will require admission and possibly physical therapy consult        Final Clinical Impression(s) / ED Diagnoses Final diagnoses:  None    Rx / DC Orders ED Discharge Orders     None         Lorre Nick, MD 10/14/21 2234

## 2021-10-15 ENCOUNTER — Other Ambulatory Visit: Payer: Self-pay

## 2021-10-15 ENCOUNTER — Encounter (HOSPITAL_COMMUNITY): Payer: Self-pay | Admitting: Internal Medicine

## 2021-10-15 ENCOUNTER — Observation Stay (HOSPITAL_COMMUNITY): Payer: Medicare PPO

## 2021-10-15 DIAGNOSIS — R531 Weakness: Secondary | ICD-10-CM | POA: Diagnosis not present

## 2021-10-15 DIAGNOSIS — K219 Gastro-esophageal reflux disease without esophagitis: Secondary | ICD-10-CM | POA: Diagnosis present

## 2021-10-15 LAB — CBC WITH DIFFERENTIAL/PLATELET
Abs Immature Granulocytes: 0.03 10*3/uL (ref 0.00–0.07)
Basophils Absolute: 0 10*3/uL (ref 0.0–0.1)
Basophils Relative: 0 %
Eosinophils Absolute: 0 10*3/uL (ref 0.0–0.5)
Eosinophils Relative: 0 %
HCT: 32.2 % — ABNORMAL LOW (ref 36.0–46.0)
Hemoglobin: 10.2 g/dL — ABNORMAL LOW (ref 12.0–15.0)
Immature Granulocytes: 0 %
Lymphocytes Relative: 11 %
Lymphs Abs: 0.8 10*3/uL (ref 0.7–4.0)
MCH: 25.9 pg — ABNORMAL LOW (ref 26.0–34.0)
MCHC: 31.7 g/dL (ref 30.0–36.0)
MCV: 81.7 fL (ref 80.0–100.0)
Monocytes Absolute: 0.4 10*3/uL (ref 0.1–1.0)
Monocytes Relative: 6 %
Neutro Abs: 6 10*3/uL (ref 1.7–7.7)
Neutrophils Relative %: 83 %
Platelets: 179 10*3/uL (ref 150–400)
RBC: 3.94 MIL/uL (ref 3.87–5.11)
RDW: 15.7 % — ABNORMAL HIGH (ref 11.5–15.5)
WBC: 7.2 10*3/uL (ref 4.0–10.5)
nRBC: 0 % (ref 0.0–0.2)

## 2021-10-15 LAB — MAGNESIUM: Magnesium: 1.9 mg/dL (ref 1.7–2.4)

## 2021-10-15 LAB — COMPREHENSIVE METABOLIC PANEL
ALT: 13 U/L (ref 0–44)
AST: 24 U/L (ref 15–41)
Albumin: 3.4 g/dL — ABNORMAL LOW (ref 3.5–5.0)
Alkaline Phosphatase: 50 U/L (ref 38–126)
Anion gap: 7 (ref 5–15)
BUN: 10 mg/dL (ref 8–23)
CO2: 26 mmol/L (ref 22–32)
Calcium: 8.8 mg/dL — ABNORMAL LOW (ref 8.9–10.3)
Chloride: 107 mmol/L (ref 98–111)
Creatinine, Ser: 0.7 mg/dL (ref 0.44–1.00)
GFR, Estimated: >60 mL/min (ref >60)
Glucose, Bld: 79 mg/dL (ref 70–99)
Potassium: 3.3 mmol/L — ABNORMAL LOW (ref 3.5–5.1)
Sodium: 140 mmol/L (ref 135–145)
Total Bilirubin: 0.6 mg/dL (ref 0.3–1.2)
Total Protein: 6.4 g/dL — ABNORMAL LOW (ref 6.5–8.1)

## 2021-10-15 LAB — PROCALCITONIN: Procalcitonin: <0.1 ng/mL

## 2021-10-15 LAB — TSH: TSH: 3.374 u[IU]/mL (ref 0.350–4.500)

## 2021-10-15 MED ORDER — APIXABAN 5 MG PO TABS
5.0000 mg | ORAL_TABLET | Freq: Two times a day (BID) | ORAL | Status: DC
  Administered 2021-10-15 – 2021-10-18 (×6): 5 mg via ORAL
  Filled 2021-10-15 (×6): qty 1

## 2021-10-15 MED ORDER — FLECAINIDE ACETATE 50 MG PO TABS
50.0000 mg | ORAL_TABLET | Freq: Two times a day (BID) | ORAL | Status: DC
  Administered 2021-10-16 – 2021-10-18 (×5): 50 mg via ORAL
  Filled 2021-10-15 (×7): qty 1

## 2021-10-15 MED ORDER — ROSUVASTATIN CALCIUM 20 MG PO TABS
20.0000 mg | ORAL_TABLET | Freq: Every day | ORAL | Status: DC
  Administered 2021-10-15 – 2021-10-18 (×4): 20 mg via ORAL
  Filled 2021-10-15 (×4): qty 1

## 2021-10-15 MED ORDER — FUROSEMIDE 20 MG PO TABS
20.0000 mg | ORAL_TABLET | Freq: Every day | ORAL | Status: DC
  Administered 2021-10-15 – 2021-10-16 (×2): 20 mg via ORAL
  Filled 2021-10-15 (×2): qty 1

## 2021-10-15 MED ORDER — AMLODIPINE BESYLATE 10 MG PO TABS
10.0000 mg | ORAL_TABLET | Freq: Every day | ORAL | Status: DC
  Administered 2021-10-15 – 2021-10-18 (×4): 10 mg via ORAL
  Filled 2021-10-15 (×3): qty 1
  Filled 2021-10-15: qty 2

## 2021-10-15 MED ORDER — HALOPERIDOL LACTATE 5 MG/ML IJ SOLN
5.0000 mg | Freq: Once | INTRAMUSCULAR | Status: AC
  Administered 2021-10-15: 5 mg via INTRAVENOUS
  Filled 2021-10-15: qty 1

## 2021-10-15 MED ORDER — METOPROLOL SUCCINATE ER 50 MG PO TB24
50.0000 mg | ORAL_TABLET | Freq: Every day | ORAL | Status: DC
  Administered 2021-10-15 – 2021-10-18 (×4): 50 mg via ORAL
  Filled 2021-10-15 (×4): qty 1

## 2021-10-15 MED ORDER — LISINOPRIL 20 MG PO TABS
20.0000 mg | ORAL_TABLET | Freq: Every day | ORAL | Status: DC
  Administered 2021-10-15 – 2021-10-18 (×4): 20 mg via ORAL
  Filled 2021-10-15: qty 1
  Filled 2021-10-15: qty 2
  Filled 2021-10-15 (×2): qty 1

## 2021-10-15 MED ORDER — PANTOPRAZOLE SODIUM 40 MG PO TBEC
40.0000 mg | DELAYED_RELEASE_TABLET | Freq: Every day | ORAL | Status: DC
  Administered 2021-10-15 – 2021-10-18 (×4): 40 mg via ORAL
  Filled 2021-10-15 (×4): qty 1

## 2021-10-15 MED ORDER — POTASSIUM CHLORIDE CRYS ER 20 MEQ PO TBCR
40.0000 meq | EXTENDED_RELEASE_TABLET | Freq: Once | ORAL | Status: AC
  Administered 2021-10-15: 40 meq via ORAL
  Filled 2021-10-15: qty 2

## 2021-10-15 NOTE — H&P (Signed)
History and Physical    PLEASE NOTE THAT DRAGON DICTATION SOFTWARE WAS USED IN THE CONSTRUCTION OF THIS NOTE.   Donna Simmons [id number] DOB: [birth year] DOA: 10/14/2021  PCP: Horald Pollen, MD  Patient coming from: home   I have personally briefly reviewed patient's old medical records in [place]  Chief Complaint: Generalized weakness  HPI: Donna Simmons is a 81 y.o. female with medical history significant for advanced dementia, essential hypertension, hyperlipidemia, paroxysmal atrial fibrillation chronically anticoagulated on Eliquis, who is admitted to [facility] on 10/14/2021 with generalized weakness after presenting from home to [facility] ED for evaluation of such.  In the setting of the patient's advanced dementia history was provided by the patient's husband, with patient lives, and addition to medicine and via chart review.  At baseline, husband reports that the patient is able to ambulate without assistance, including independent transfers and does not require any assist devices, including no cane or walker.  However, over the last 2 to 3 days, husband believes that the patient has become weak and a general sense, rendering it difficult for her to ambulate relative to his baseline ambulatory abilities.  He is not aware of any acute focal weakness.  Denies any preceding trauma.  No recent vomiting, diarrhea, rash, fever, cough.      ED Course:  Vital signs in the ED were notable for the following: Afebrile; heart rate 63-85; blood pressure 124/61-144/71; respiratory rate 15-16, oxygen saturation 98 to 100% on room air.  Labs were notable for the following: BMP notable for the following: Sodium 140, creatinine 0.77, glucose 91.  CBC notable for white cell count 5700.  Urinalysis notable for 6-10 white blood cells, no bacteria, nitrate negative findings.  COVID-19/influenza PCR negative.  Imaging and additional notable ED work-up: Noncontrast CT of the  head showed no evidence of acute intracranial process.  Subsequently, the patient was admitted for overnight observation to [place] for further evaluation and management of presenting generalized weakness.     Review of Systems: As per HPI otherwise 10 point review of systems negative.   Past Medical History:  Diagnosis Date   Allergy    GERD (gastroesophageal reflux disease)    Heart murmur    No significant valvular lesion noted on echo.   Hypercholesteremia    Hypertension    Memory loss    Paroxysmal atrial fibrillation ([place]) 12/27/2008    Past Surgical History:  Procedure Laterality Date   ABDOMINAL HYSTERECTOMY  1980   BREAST SURGERY     CT CTA CORONARY W/CA SCORE W/CM &/OR WO/CM  08/2018   Coronary calcium score 24.  Nonobstructive CAD with less than 30% plaque in proximal LAD.  Normal aortic root. ->  Relook in October 2020 had significant motion artifact but coronary calcium score 26.   EYE SURGERY Right 12/29/2016   EYE SURGERY Left 01/19/2017   FRACTURE SURGERY     LEFT HEART CATH AND CORONARY ANGIOGRAPHY  12/16/2010   no evidence of CAD to explain anginal pain w/ positive troponin.  potential etiology is breakthrough AF   NM MYOVIEW LTD  April 2010   EF 64%, normal pattern of perfusion in all regions, no scintigraphic evidence of inducible ischemia; no significant wall motion abnormalities; EKG negative for ischemia; no significant change from last study; low risk scan   TRANSTHORACIC ECHOCARDIOGRAM  07/2018   Normal EF 55-60%.  No RWMA. GR 1 DD. Ao Sclerosis - no Stenosis. Mild AI. Trivial MR.  Social History:  reports that she has never smoked. She has never used smokeless tobacco. She reports that she does not drink alcohol and does not use drugs.   Allergies  Allergen Reactions   Antihistamines, Chlorpheniramine-Type Palpitations    Makes heart beat fast    Family History  Problem Relation Age of Onset   Arthritis Mother        OSTEO   Hypertension  Mother    Dementia Mother    Heart disease Father    Hypertension Sister    Dementia Sister    Cancer - Lung Brother    Cancer Maternal Grandmother    Heart disease Maternal Grandfather    Cancer - Lung Brother    Hypertension Brother    Cancer - Prostate Brother     Family history reviewed and not pertinent    Prior to Admission medications   Medication Sig Start Date End Date Taking? Authorizing Provider  acetaminophen (TYLENOL) 325 MG tablet Take 325-650 mg by mouth daily as needed (pain or headaches).     [provider]  amLODipine (NORVASC) 10 MG tablet Take 10 mg by mouth daily. 09/14/17   [provider]  apixaban Arne Cleveland) 5 MG TABS tablet Take 1 tablet by mouth twice daily 08/08/21   Leonie Man, MD  Blood Pressure Monitoring (BLOOD PRESSURE CUFF) MISC Take your  blood pressure with blood pressure cuff/moniotor- can be automatic or manual - one to three  times a week as needed 04/20/19   Leonie Man, MD  cephALEXin (KEFLEX) 500 MG capsule Take 1 capsule (500 mg total) by mouth 4 (four) times daily. 08/06/21   Sherrill Raring, PA-C  ENSURE (ENSURE) Take 237 mLs by mouth daily.    [provider]  flecainide (TAMBOCOR) 50 MG tablet TAKE 1 TABLET BY MOUTH TWICE DAILY . APPOINTMENT REQUIRED FOR FUTURE REFILLS 01/14/21   Leonie Man, MD  fluticasone [facility]) 50 MCG/ACT nasal spray USE TWO SPRAY(S) IN EACH NOSTRIL ONCE DAILY Patient taking differently: Place 1 spray into both nostrils daily. 01/05/17   Wendie Agreste, MD  furosemide (LASIX) 20 MG tablet Take 1 tablet (20 mg total) by mouth daily. May take an additional 20 mg if weight gain or swelling of 3 lbs overnight 08/19/21   Leonie Man, MD  lisinopril (ZESTRIL) 20 MG tablet Take 1 tablet by mouth once daily 08/14/20   Leonie Man, MD  memantine [facility]) 5 MG tablet Take 1 tablet daily for one week, then take 1 tablet twice daily for one week, then take 1 tablet in the morning and 2  in the evening for one week, then take 2 tablets twice daily 07/27/19   Kathrynn Ducking, MD  metoprolol succinate (TOPROL-XL) 50 MG 24 hr tablet TAKE 1 TABLET BY MOUTH ONCE DAILY WITH OR IMMEDIATELY FOLLOWING A MEAL 04/02/21   Leonie Man, MD  metoprolol tartrate (LOPRESSOR) 50 MG tablet Take 1 tablet only with flecainide as needed for rapid fast heart rate 08/08/19   Leonie Man, MD  Multiple Vitamins-Minerals (CENTRUM SILVER 50+WOMEN) TABS Take 1 tablet by mouth daily with lunch.    [provider]  MYRBETRIQ 25 MG TB24 tablet Take 25 mg by mouth daily. Patient not taking: Reported on 08/19/2021 09/15/20   [provider]  omeprazole (PRILOSEC) 40 MG capsule     [provider]  rosuvastatin (CRESTOR) 20 MG tablet Take 1 tablet by mouth once daily 08/08/20   Ellyn Hack,  Leonie Green, MD  sucralfate (CARAFATE) 1 GM/10ML suspension Take 10 mLs (1 g total) by mouth 4 (four) times daily -  with meals and at bedtime. Patient not taking: Reported on 08/19/2021 01/05/18   Wendie Agreste, MD     Objective    Physical Exam: Vitals:   10/14/21 2100 10/14/21 2130 10/14/21 2200 10/15/21 0255  BP: 124/61 (!) 144/71 139/87 133/69  Pulse: 63 63 85 98  Resp: 16  15 16   Temp:      TempSrc:      SpO2: 100% 98% 95% 100%    General: appears to be stated age; confused, agitated, unable to follow instructions Skin: warm, dry, no rash Head:  AT/[place] Mouth:  Oral mucosa membranes appear moist, normal dentition Neck: supple; trachea midline Heart:  RRR; did not appreciate any M/R/G Lungs: CTAB, did not appreciate any wheezes, rales, or rhonchi Abdomen: + BS; soft, ND, NT Vascular: 2+ pedal pulses b/l; 2+ radial pulses b/l Extremities: no peripheral edema, no muscle wasting Neuro: In the setting of the patient's current mental status and associated inability to follow instructions, unable to perform full neurologic exam at this time.  As such, assessment of strength, sensation,  and cranial nerves is limited at this time. Patient noted to spontaneously move all 4 extremities. No tremors.    Labs on Admission: I have personally reviewed following labs and imaging studies  CBC: Recent Labs  Lab 10/14/21 1938 10/15/21 0300  WBC 5.7 7.2  NEUTROABS 4.5 6.0  HGB 10.8* 10.2*  HCT 34.3* 32.2*  MCV 82.9 81.7  PLT 207 0000000   Basic Metabolic Panel: Recent Labs  Lab 10/14/21 1900 10/15/21 0300  NA 140 140  K 3.6 3.3*  CL 108 107  CO2 28 26  GLUCOSE 91 79  BUN 14 10  CREATININE 0.77 0.70  CALCIUM 9.0 8.8*  MG  --  1.9   GFR: CrCl cannot be calculated (Unknown ideal weight.). Liver Function Tests: Recent Labs  Lab 10/15/21 0300  AST 24  ALT 13  ALKPHOS 50  BILITOT 0.6  PROT 6.4*  ALBUMIN 3.4*   No results for input(s): LIPASE, AMYLASE in the last 168 hours. No results for input(s): AMMONIA in the last 168 hours. Coagulation Profile: No results for input(s): INR, PROTIME in the last 168 hours. Cardiac Enzymes: No results for input(s): CKTOTAL, CKMB, CKMBINDEX, TROPONINI in the last 168 hours. BNP (last 3 results) No results for input(s): PROBNP in the last 8760 hours. HbA1C: No results for input(s): HGBA1C in the last 72 hours. CBG: No results for input(s): GLUCAP in the last 168 hours. Lipid Profile: No results for input(s): CHOL, HDL, LDLCALC, TRIG, CHOLHDL, LDLDIRECT in the last 72 hours. Thyroid Function Tests: No results for input(s): TSH, T4TOTAL, FREET4, T3FREE, THYROIDAB in the last 72 hours. Anemia Panel: No results for input(s): VITAMINB12, FOLATE, FERRITIN, TIBC, IRON, RETICCTPCT in the last 72 hours. Urine analysis:    Component Value Date/Time   COLORURINE YELLOW 10/14/2021 1944   APPEARANCEUR CLEAR 10/14/2021 1944   LABSPEC 1.021 10/14/2021 1944   PHURINE 6.0 10/14/2021 1944   GLUCOSEU NEGATIVE 10/14/2021 1944   HGBUR MODERATE (A) 10/14/2021 1944   BILIRUBINUR NEGATIVE 10/14/2021 1944   BILIRUBINUR negative 06/03/2016  1542   BILIRUBINUR neg 07/21/2014 1622   KETONESUR NEGATIVE 10/14/2021 1944   PROTEINUR 100 (A) 10/14/2021 1944   UROBILINOGEN 0.2 06/03/2016 1542   UROBILINOGEN 0.2 12/20/2015 1535   NITRITE NEGATIVE 10/14/2021 1944   LEUKOCYTESUR SMALL (  A) 10/14/2021 1944    Radiological Exams on Admission: CT Head Wo Contrast  Result Date: 10/14/2021 CLINICAL DATA:  Acute neurologic deficit EXAM: CT HEAD WITHOUT CONTRAST TECHNIQUE: Contiguous axial images were obtained from the base of the skull through the vertex without intravenous contrast. RADIATION DOSE REDUCTION: This exam was performed according to the departmental dose-optimization program which includes automated exposure control, adjustment of the mA and/or kV according to patient size and/or use of iterative reconstruction technique. COMPARISON:  08/06/2021 FINDINGS: Brain: There is no mass, hemorrhage or extra-axial collection. There is generalized atrophy without lobar predilection. Hypodensity of the white matter is most commonly associated with chronic microvascular disease. Vascular: No abnormal hyperdensity of the major intracranial arteries or dural venous sinuses. No intracranial atherosclerosis. Skull: The visualized skull base, calvarium and extracranial soft tissues are normal. Sinuses/Orbits: No fluid levels or advanced mucosal thickening of the visualized paranasal sinuses. No mastoid or middle ear effusion. The orbits are normal. IMPRESSION: 1. No acute intracranial abnormality. 2. Chronic microvascular ischemia and generalized atrophy. Electronically Signed   By: Ulyses Jarred M.D.   On: 10/14/2021 21:22      Assessment/Plan    Principal Problem:   Generalized weakness Active Problems:   Paroxysmal atrial fibrillation (HCC) - CHA2DS2-VASc Score 3, on Eliquis   Essential hypertension   Hypercholesteremia   GERD (gastroesophageal reflux disease)     #) Generalized weakness: 2 to 3-day duration duration of generalized weakness  resulting in relative decline in ambulatory abilities relative to baseline, per history provided has been as outlined above.  Per this report, there have been no associated acute focal neurologic deficits, however.  And suspected sundowning, she is currently not able to follow instructions, including that associated with the full neurologic evaluation.  I am not currently comprehensively able to assess for acute focal neurologic deficits.  Of note, presenting CT head showed no evidence of acute intracranial process.  Source of patient's generalized weakness is currently unclear.  No evidence of underlying infectious source at this time, including urinalysis that was inconsistent with UTI, while COVID-19/influenza PCR found to be negative.  Also check chest x-ray to further evaluate for any contributory underlying infectious process.   Plan: PT consult ordered for the AM. Fall precautions. Check TSH, MMA. CMP/CBC in the AM.  Chest x-ray.         #) GERD: documented h/o such; on omeprazole as outpatient.   Plan: continue home PPI.          #) Essential Hypertension: documented h/o such, with outpatient antihypertensive regimen including Norvasc, lisinopril, Toprol-XL.  SBP's in the ED today: Normotensive.   Plan: Close monitoring of subsequent BP via routine VS. continue home antihypertensive meds.          #) Hyperlipidemia: documented h/o such. On high intensity rosuvastatin as outpatient.    Plan: continue home statin.         #) Paroxysmal atrial fibrillation: Documented history of such. In setting of CHA2DS2-VASc score of 5, there is an indication for chronic anticoagulation for thromboembolic prophylaxis. Consistent with this, patient is chronically anticoagulated on Eliquis. Home AV nodal blocking regimen: Metoprolol succinate.  Additionally, rhythm control strategy pursued as an outpatient, on flecainide.    Plan: monitor strict I's & O's and daily weights.  Repeat BMP/CBC in AM. Check serum mag level. Continue home AV nodal blocking regimen.  Continue home Eliquis and flecainide.      DVT prophylaxis: SCD's plus Eliquis Code Status: Full code Family  Communication: none Disposition Plan: Per Rounding Team Consults called: Case discussed with husband, as above;  Admission status: Observation; MedSurg   PLEASE NOTE THAT DRAGON DICTATION SOFTWARE WAS USED IN THE CONSTRUCTION OF THIS NOTE.   Terrell Hills DO [facility] [place]   10/15/2021, 5:29 AM

## 2021-10-15 NOTE — Progress Notes (Signed)
PROGRESS NOTE    LATOIA MANGOLD  [id number] DOB: 08-31-1941 DOA: 10/14/2021 PCP: Horald Pollen, MD   Brief Narrative:  Donna Simmons is a 81 y.o. female with medical history significant for advanced dementia, essential hypertension, hyperlipidemia, paroxysmal atrial fibrillation chronically anticoagulated on Eliquis, who is admitted to [facility] on 10/14/2021 with generalized weakness after presenting from home to [facility] ED for evaluation of such.  Assessment & Plan:   Principal Problem:   Generalized weakness Active Problems:   Paroxysmal atrial fibrillation (HCC) - CHA2DS2-VASc Score 3, on Eliquis   Essential hypertension   Hypercholesteremia   GERD (gastroesophageal reflux disease)  Generalized weakness: We will consult PT OT.  No clear evidence of infection so far.  GERD: Continue PPI.  Essential hypertension: Controlled.  Continue current home medications which include lisinopril, amlodipine and Toprol-XL.  Hyperlipidemia: Continue statin.  Paroxysmal atrial fibrillation: Controlled.  Continue home dose of beta-blocker, flecainide and Eliquis.  Hypokalemia: We will replace.  Possible aspiration pneumonia: Chest x-ray shows left lateral lung base opacity.  Patient is afebrile, no leukocytosis and she is not hypoxic.  She is unable to provide history.  We will check procalcitonin, if elevated will start on antibiotics.  DVT prophylaxis: SCDs Start: 10/14/21 2307   Code Status: Full Code  Family Communication:  None present at bedside.  We will call husband later.  Status is: Observation The patient will require care spanning > 2 midnights and should be moved to inpatient because: Very lethargic         Estimated body mass index is 23.28 kg/m as calculated from the following:   Height as of 08/19/21: 5' (1.524 m).   Weight as of 08/19/21: 54.1 kg.    Nutritional Assessment: There is no height or weight on file to calculate BMI.. Seen by  dietician.  I agree with the assessment and plan as outlined below: Nutrition Status:        . Skin Assessment: I have examined the patient's skin and I agree with the wound assessment as performed by the wound care RN as outlined below:    Consultants:    Procedures:    Antimicrobials:  Anti-infectives (From admission, onward)    None         Subjective: Patient seen and examined, still in the ED.  Patient is too lethargic to hold any conversation.  Her baseline is unknown to me.  However she lives at home with her husband.  Objective: Vitals:   10/14/21 2200 10/15/21 0255 10/15/21 0640 10/15/21 1101  BP: 139/87 133/69 (!) 120/56 (!) 118/58  Pulse: 85 98 68 62  Resp: 15 16 17 18   Temp:      TempSrc:      SpO2: 95% 100% 100% 100%   No intake or output data in the 24 hours ending 10/15/21 1120 There were no vitals filed for this visit.  Examination:  General exam: Appears very lethargic. Respiratory system: Diminished breath sounds, poor inspiratory effort.  Inability to follow commands due to lethargy. Cardiovascular system: S1 & S2 heard, irregularly irregular rate and rhythm. No JVD, murmurs, rubs, gallops or clicks. No pedal edema. Gastrointestinal system: Abdomen is nondistended, soft and nontender. No organomegaly or masses felt. Normal bowel sounds heard. Central nervous system: Lethargic.  Unable to assess orientation.  She is not following commands for me to assess thorough neurological exam.   Data Reviewed: I have personally reviewed following labs and imaging studies  CBC: Recent Labs  Lab 10/14/21 1938 10/15/21 0300  WBC 5.7 7.2  NEUTROABS 4.5 6.0  HGB 10.8* 10.2*  HCT 34.3* 32.2*  MCV 82.9 81.7  PLT 207 0000000   Basic Metabolic Panel: Recent Labs  Lab 10/14/21 1900 10/15/21 0300  NA 140 140  K 3.6 3.3*  CL 108 107  CO2 28 26  GLUCOSE 91 79  BUN 14 10  CREATININE 0.77 0.70  CALCIUM 9.0 8.8*  MG  --  1.9   GFR: CrCl cannot be  calculated (Unknown ideal weight.). Liver Function Tests: Recent Labs  Lab 10/15/21 0300  AST 24  ALT 13  ALKPHOS 50  BILITOT 0.6  PROT 6.4*  ALBUMIN 3.4*   No results for input(s): LIPASE, AMYLASE in the last 168 hours. No results for input(s): AMMONIA in the last 168 hours. Coagulation Profile: No results for input(s): INR, PROTIME in the last 168 hours. Cardiac Enzymes: No results for input(s): CKTOTAL, CKMB, CKMBINDEX, TROPONINI in the last 168 hours. BNP (last 3 results) No results for input(s): PROBNP in the last 8760 hours. HbA1C: No results for input(s): HGBA1C in the last 72 hours. CBG: No results for input(s): GLUCAP in the last 168 hours. Lipid Profile: No results for input(s): CHOL, HDL, LDLCALC, TRIG, CHOLHDL, LDLDIRECT in the last 72 hours. Thyroid Function Tests: Recent Labs    10/15/21 0642  TSH 3.374   Anemia Panel: No results for input(s): VITAMINB12, FOLATE, FERRITIN, TIBC, IRON, RETICCTPCT in the last 72 hours. Sepsis Labs: No results for input(s): PROCALCITON, LATICACIDVEN in the last 168 hours.  Recent Results (from the past 240 hour(s))  Resp Panel by RT-PCR (Flu A&B, Covid) Nasopharyngeal Swab     Status: None   Collection Time: 10/14/21 11:10 PM   Specimen: Nasopharyngeal Swab; Nasopharyngeal(NP) swabs in vial transport medium  Result Value Ref Range Status   SARS Coronavirus 2 by RT PCR NEGATIVE NEGATIVE Final    Comment: (NOTE) SARS-CoV-2 target nucleic acids are NOT DETECTED.  The SARS-CoV-2 RNA is generally detectable in upper respiratory specimens during the acute phase of infection. The lowest concentration of SARS-CoV-2 viral copies this assay can detect is 138 copies/mL. A negative result does not preclude SARS-Cov-2 infection and should not be used as the sole basis for treatment or other patient management decisions. A negative result may occur with  improper specimen collection/handling, submission of specimen other than  nasopharyngeal swab, presence of viral mutation(s) within the areas targeted by this assay, and inadequate number of viral copies(<138 copies/mL). A negative result must be combined with clinical observations, patient history, and epidemiological information. The expected result is Negative.  Fact Sheet for Patients:  EntrepreneurPulse.com.au  Fact Sheet for Healthcare Providers:  IncredibleEmployment.be  This test is no t yet approved or cleared by the [place] FDA and  has been authorized for detection and/or diagnosis of SARS-CoV-2 by FDA under an Emergency Use Authorization (EUA). This EUA will remain  in effect (meaning this test can be used) for the duration of the COVID-19 declaration under Section 564(b)(1) of the Act, 21 U.S.C.section 360bbb-3(b)(1), unless the authorization is terminated  or revoked sooner.       Influenza A by PCR NEGATIVE NEGATIVE Final   Influenza B by PCR NEGATIVE NEGATIVE Final    Comment: (NOTE) The Xpert Xpress SARS-CoV-2/FLU/RSV plus assay is intended as an aid in the diagnosis of influenza from Nasopharyngeal swab specimens and should not be used as a sole basis for treatment. Nasal washings and aspirates are unacceptable for  Xpert Xpress SARS-CoV-2/FLU/RSV testing.  Fact Sheet for Patients: EntrepreneurPulse.com.au  Fact Sheet for Healthcare Providers: IncredibleEmployment.be  This test is not yet approved or cleared by the [place] FDA and has been authorized for detection and/or diagnosis of SARS-CoV-2 by FDA under an Emergency Use Authorization (EUA). This EUA will remain in effect (meaning this test can be used) for the duration of the COVID-19 declaration under Section 564(b)(1) of the Act, 21 U.S.C. section 360bbb-3(b)(1), unless the authorization is terminated or revoked.  Performed at [facility], [place] [address], [place], [place] [address]      Radiology Studies: CT Head Wo Contrast  Result Date: 10/14/2021 CLINICAL DATA:  Acute neurologic deficit EXAM: CT HEAD WITHOUT CONTRAST TECHNIQUE: Contiguous axial images were obtained from the base of the skull through the vertex without intravenous contrast. RADIATION DOSE REDUCTION: This exam was performed according to the departmental dose-optimization program which includes automated exposure control, adjustment of the mA and/or kV according to patient size and/or use of iterative reconstruction technique. COMPARISON:  08/06/2021 FINDINGS: Brain: There is no mass, hemorrhage or extra-axial collection. There is generalized atrophy without lobar predilection. Hypodensity of the white matter is most commonly associated with chronic microvascular disease. Vascular: No abnormal hyperdensity of the major intracranial arteries or dural venous sinuses. No intracranial atherosclerosis. Skull: The visualized skull base, calvarium and extracranial soft tissues are normal. Sinuses/Orbits: No fluid levels or advanced mucosal thickening of the visualized paranasal sinuses. No mastoid or middle ear effusion. The orbits are normal. IMPRESSION: 1. No acute intracranial abnormality. 2. Chronic microvascular ischemia and generalized atrophy. Electronically Signed   By: Ulyses Jarred M.D.   On: 10/14/2021 21:22   DG CHEST PORT 1 VIEW  Result Date: 10/15/2021 CLINICAL DATA:  Weakness and dementia. EXAM: PORTABLE CHEST 1 VIEW COMPARISON:  08/06/2021 FINDINGS: Heart size is normal. Aortic atherosclerotic calcifications. There is an opacity within the lateral left lung base which may represent atelectasis, airspace disease or small effusion. Right lung appears clear. No signs of interstitial edema. IMPRESSION: Opacity within the lateral left lung base may represent atelectasis, airspace disease or small effusion. Electronically Signed   By: Kerby Moors M.D.   On: 10/15/2021 06:09     Scheduled Meds:  amLODipine  10 mg Oral Daily   apixaban  5 mg Oral BID   flecainide  50 mg Oral Q12H   furosemide  20 mg Oral Daily   lisinopril  20 mg Oral Daily   metoprolol succinate  50 mg Oral Daily   pantoprazole  40 mg Oral Daily   rosuvastatin  20 mg Oral Daily   Continuous Infusions:   LOS: 0 days   Time spent: 30 minutes  Darliss Cheney, MD [facility]  10/15/2021, 11:20 AM  Please page via [facility] and do not message via secure chat for urgent patient care matters. Secure chat can be used for non urgent patient care matters.  How to contact the [facility] Attending or Consulting provider [place] or covering provider during after hours [place], for this patient?  Check the care team in [facility] and look for a) attending/consulting [facility] provider listed and b) the [facility] team listed. Page or secure chat [phone]. Log into [email or web address] and use [place] universal password to access. If you do not have the password, please contact the hospital operator. Locate the [facility] provider you are looking for under [facility] and page to a number that you can be directly reached. If you  still have difficulty reaching the provider, please page the [facility] (Director on Call) for the Hospitalists listed on amion for assistance.

## 2021-10-15 NOTE — ED Notes (Signed)
Donna Simmons patients husband would like a call back with an update: [phone]

## 2021-10-15 NOTE — Evaluation (Signed)
Physical Therapy Evaluation Patient Details Name: Donna Simmons MRN: [id number] DOB: 05-20-1941 Today's Date: 10/15/2021  History of Present Illness  81 yo female admitted with weakness, inability to ambulate at home. Hx of dementia, Afib  Clinical Impression  On eval in ED, pt required Mod A for mobility. She was able to stand walk ~5 feet with a RW. Pt presents with general weakness, decreased activity tolerance, and impaired gait and balance. She is at risk for falls when mobilizing. She follows 1 step commands with increased time and some repetition (occasionally needs multimodal cueing). Per chart review, pt is normally ambulatory without a device at baseline. Husband reports he has been unable to care for her due to this recent change in mobility status. PT recommendation is for ST SNF for rehab. Will plan to follow during hospital stay.       Recommendations for follow up therapy are one component of a multi-disciplinary discharge planning process, led by the attending physician.  Recommendations may be updated based on patient status, additional functional criteria and insurance authorization.  Follow Up Recommendations Skilled nursing-short term rehab (<3 hours/day)    Assistance Recommended at Discharge Frequent or constant Supervision/Assistance  Patient can return home with the following  A lot of help with walking and/or transfers;A lot of help with bathing/dressing/bathroom;Assistance with cooking/housework;Assist for transportation;Help with stairs or ramp for entrance;Direct supervision/assist for financial management;Direct supervision/assist for medications management    Equipment Recommendations Rolling walker (2 wheels) (possibly-continuing to assess)  Recommendations for Other Services  OT consult    Functional Status Assessment Patient has had a recent decline in their functional status and demonstrates the ability to make significant improvements in function in a  reasonable and predictable amount of time.     Precautions / Restrictions Precautions Precautions: Fall Precaution Comments: incontinent Restrictions Weight Bearing Restrictions: No      Mobility  Bed Mobility Overal bed mobility: Needs Assistance Bed Mobility: Supine to Sit, Sit to Supine     Supine to sit: Mod assist, HOB elevated Sit to supine: Mod assist, HOB elevated   General bed mobility comments: Increased time. Multimodal cues. Assist for trunk and LEs.    Transfers Overall transfer level: Needs assistance Equipment used: Rolling walker (2 wheels) Transfers: Sit to/from Stand Sit to Stand: Min assist, From elevated surface           General transfer comment: Assist to rise, steady, control descent. Had to block feet from slipping forward (socks slippery on floor). Cues required.    Ambulation/Gait Ambulation/Gait assistance: Min assist Gait Distance (Feet): 5 Feet Assistive device: Rolling walker (2 wheels) Gait Pattern/deviations: Step-to pattern, Shuffle, Trunk flexed       General Gait Details: Pt took shuffling steps forwards then backwards. She then took side steps along the bed. She is unsteady and at risk for falls.  Stairs            Wheelchair Mobility    Modified Rankin (Stroke Patients Only)       Balance Overall balance assessment: Needs assistance         Standing balance support: Bilateral upper extremity supported Standing balance-Leahy Scale: Poor                               Pertinent Vitals/Pain Pain Assessment Pain Assessment: Faces Faces Pain Scale: Hurts little more Pain Location: LEs with touch Pain Descriptors / Indicators: Discomfort, Grimacing Pain Intervention(s): Limited  activity within patient's tolerance, Monitored during session, Repositioned    Home Living Family/patient expects to be discharged to:: Unsure Living Arrangements: Spouse/significant other Available Help at Discharge:  Family                    Prior Function Prior Level of Function : Needs assist             Mobility Comments: per chart review, husband reported pt was ambulatory without a device       Hand Dominance        Extremity/Trunk Assessment   Upper Extremity Assessment Upper Extremity Assessment: Defer to OT evaluation    Lower Extremity Assessment Lower Extremity Assessment: Generalized weakness    Cervical / Trunk Assessment Cervical / Trunk Assessment: Kyphotic  Communication      Cognition Arousal/Alertness: Awake/alert Behavior During Therapy: WFL for tasks assessed/performed Overall Cognitive Status: History of cognitive impairments - at baseline                                 General Comments: pt follows 1 step commands with time and some repetition        General Comments      Exercises     Assessment/Plan    PT Assessment Patient needs continued PT services  PT Problem List Decreased strength;Decreased mobility;Decreased activity tolerance;Decreased balance;Decreased knowledge of use of DME       PT Treatment Interventions DME instruction;Gait training;Therapeutic activities;Therapeutic exercise;Patient/family education;Balance training;Functional mobility training    PT Goals (Current goals can be found in the Care Plan section)  Acute Rehab PT Goals Patient Stated Goal: none stated PT Goal Formulation: Patient unable to participate in goal setting Time For Goal Achievement: 10/29/21 Potential to Achieve Goals: Fair    Frequency Min 2X/week     Co-evaluation               AM-PAC PT "6 Clicks" Mobility  Outcome Measure Help needed turning from your back to your side while in a flat bed without using bedrails?: A Lot Help needed moving from lying on your back to sitting on the side of a flat bed without using bedrails?: A Lot Help needed moving to and from a bed to a chair (including a wheelchair)?: A Lot Help  needed standing up from a chair using your arms (e.g., wheelchair or bedside chair)?: A Lot Help needed to walk in hospital room?: A Lot Help needed climbing 3-5 steps with a railing? : A Lot 6 Click Score: 12    End of Session Equipment Utilized During Treatment: Gait belt Activity Tolerance: Patient tolerated treatment well Patient left: in bed;with call bell/phone within reach   PT Visit Diagnosis: Muscle weakness (generalized) (M62.81);Difficulty in walking, not elsewhere classified (R26.2)    Time: 1000-1018 PT Time Calculation (min) (ACUTE ONLY): 18 min   Charges:   PT Evaluation $PT Eval Moderate Complexity: 1 Mod            Faye Ramsay, PT Acute Rehabilitation  Office: [phone] Pager: [phone]

## 2021-10-16 DIAGNOSIS — R531 Weakness: Secondary | ICD-10-CM | POA: Diagnosis not present

## 2021-10-16 NOTE — Progress Notes (Signed)
PROGRESS NOTE  SAMERAH WARSHAWSKY  NOT:[id number] DOB: [birth year] DOA: 10/14/2021 PCP: Georgina Quint, MD   Brief Narrative: Patient is a 81 year old female with history of advanced dementia, hypertension, hyperlipidemia, paroxysmal A-fib on Eliquis who presented here from home with complaints of generalized weakness.  At baseline, patient is able to ambulate without assistance including independent transfer.  As per the husband, she has become more weak, unable to ambulate recently.  On presentation she was hemodynamically stable.  CT head did not show any acute intracranial abnormalities.  PT has recommended skilled nursing facility on discharge.  TOC consulted  Assessment & Plan:  Principal Problem:   Generalized weakness Active Problems:   Paroxysmal atrial fibrillation (HCC) - CHA2DS2-VASc Score 3, on Eliquis   Essential hypertension   Hypercholesteremia   GERD (gastroesophageal reflux disease)  Generalized weakness: Was fairly independent and was ambulating but became weak recently and was brought from home by her husband. This is most likely generalized deconditioning. PT has recommended skilled nursing facility.  OT evaluation pending.  TOC consulted for nursing facility placement.  History of paroxysmal A-fib: Currently rate is controlled.  On normal sinus rhythm.  On beta-blocker, flecainide for rate control, on Eliquis for anticoagulation  Suspected aspiration pneumonia: Chest x-ray showed left lateral lung base opacity.  No signs of hypoxia, no leukocytosis or fever.  Procalcitonin minimally elevated.  No indication for antibiotic therapy  Hyperlipidemia: On statin  GERD: On PPI  Hypertension: Currently blood pressure is controlled.  On lisinopril, amlodipine, Toprol at home.  Being continued  Hypokalemia: Supplemented             DVT prophylaxis:SCDs Start: 10/14/21 2307 apixaban (ELIQUIS) tablet 5 mg     Code Status: Full Code  Family Communication:  None at bedside  Patient status:HOme  Patient is from :SNF  Anticipated discharge to: Whenever bed is available at the skilled nursing facility  Estimated DC date: 1 to 2 days   Consultants: None  Procedures:None  Antimicrobials:  Anti-infectives (From admission, onward)    None       Subjective: Patient seen and examined at the bedside this morning.  Hemodynamically stable.  Sitting in the chair.  Disoriented but alert and awake and communicates well.  Not in any kind of distress  Objective: Vitals:   10/15/21 1900 10/15/21 2034 10/16/21 0145 10/16/21 0553  BP: 114/63 133/79 115/62 119/71  Pulse: 64 63 67 73  Resp: 16 15 15 16   Temp:  (!) 97.4 F (36.3 C) 97.6 F (36.4 C) 98.6 F (37 C)  TempSrc:  Oral Oral Oral  SpO2: 97% 93% 97% 98%  Weight:      Height:        Intake/Output Summary (Last 24 hours) at 10/16/2021 0818 Last data filed at 10/16/2021 0600 Gross per 24 hour  Intake 0 ml  Output --  Net 0 ml   Filed Weights   10/15/21 1714  Weight: 54.1 kg    Examination:  General exam: Overall comfortable, not in distress, pleasantly confused elderly female, deconditioned HEENT: PERRL Respiratory system:  no wheezes or crackles  Cardiovascular system: S1 & S2 heard, RRR.  Gastrointestinal system: Abdomen is nondistended, soft and nontender. Central nervous system: Alert and awake, disoriented, follows commands Extremities: No edema, no clubbing ,no cyanosis Skin: No rashes, no ulcers,no icterus     Data Reviewed: I have personally reviewed following labs and imaging studies  CBC: Recent Labs  Lab 10/14/21 1938 10/15/21 0300  WBC 5.7  7.2  NEUTROABS 4.5 6.0  HGB 10.8* 10.2*  HCT 34.3* 32.2*  MCV 82.9 81.7  PLT 207 179   Basic Metabolic Panel: Recent Labs  Lab 10/14/21 1900 10/15/21 0300  NA 140 140  K 3.6 3.3*  CL 108 107  CO2 28 26  GLUCOSE 91 79  BUN 14 10  CREATININE 0.77 0.70  CALCIUM 9.0 8.8*  MG  --  1.9     Recent  Results (from the past 240 hour(s))  Resp Panel by RT-PCR (Flu A&B, Covid) Nasopharyngeal Swab     Status: None   Collection Time: 10/14/21 11:10 PM   Specimen: Nasopharyngeal Swab; Nasopharyngeal(NP) swabs in vial transport medium  Result Value Ref Range Status   SARS Coronavirus 2 by RT PCR NEGATIVE NEGATIVE Final    Comment: (NOTE) SARS-CoV-2 target nucleic acids are NOT DETECTED.  The SARS-CoV-2 RNA is generally detectable in upper respiratory specimens during the acute phase of infection. The lowest concentration of SARS-CoV-2 viral copies this assay can detect is 138 copies/mL. A negative result does not preclude SARS-Cov-2 infection and should not be used as the sole basis for treatment or other patient management decisions. A negative result may occur with  improper specimen collection/handling, submission of specimen other than nasopharyngeal swab, presence of viral mutation(s) within the areas targeted by this assay, and inadequate number of viral copies(<138 copies/mL). A negative result must be combined with clinical observations, patient history, and epidemiological information. The expected result is Negative.  Fact Sheet for Patients:  BloggerCourse.com  Fact Sheet for Healthcare Providers:  SeriousBroker.it  This test is no t yet approved or cleared by the [place] FDA and  has been authorized for detection and/or diagnosis of SARS-CoV-2 by FDA under an Emergency Use Authorization (EUA). This EUA will remain  in effect (meaning this test can be used) for the duration of the COVID-19 declaration under Section 564(b)(1) of the Act, 21 U.S.C.section 360bbb-3(b)(1), unless the authorization is terminated  or revoked sooner.       Influenza A by PCR NEGATIVE NEGATIVE Final   Influenza B by PCR NEGATIVE NEGATIVE Final    Comment: (NOTE) The Xpert Xpress SARS-CoV-2/FLU/RSV plus assay is intended as an aid in the  diagnosis of influenza from Nasopharyngeal swab specimens and should not be used as a sole basis for treatment. Nasal washings and aspirates are unacceptable for Xpert Xpress SARS-CoV-2/FLU/RSV testing.  Fact Sheet for Patients: BloggerCourse.com  Fact Sheet for Healthcare Providers: SeriousBroker.it  This test is not yet approved or cleared by the [place] FDA and has been authorized for detection and/or diagnosis of SARS-CoV-2 by FDA under an Emergency Use Authorization (EUA). This EUA will remain in effect (meaning this test can be used) for the duration of the COVID-19 declaration under Section 564(b)(1) of the Act, 21 U.S.C. section 360bbb-3(b)(1), unless the authorization is terminated or revoked.  Performed at [facility], [address] [place], [place] [address]      Radiology Studies: CT Head Wo Contrast  Result Date: 10/14/2021 CLINICAL DATA:  Acute neurologic deficit EXAM: CT HEAD WITHOUT CONTRAST TECHNIQUE: Contiguous axial images were obtained from the base of the skull through the vertex without intravenous contrast. RADIATION DOSE REDUCTION: This exam was performed according to the departmental dose-optimization program which includes automated exposure control, adjustment of the mA and/or kV according to patient size and/or use of iterative reconstruction technique. COMPARISON:  08/06/2021 FINDINGS: Brain: There is no mass, hemorrhage or extra-axial collection. There  is generalized atrophy without lobar predilection. Hypodensity of the white matter is most commonly associated with chronic microvascular disease. Vascular: No abnormal hyperdensity of the major intracranial arteries or dural venous sinuses. No intracranial atherosclerosis. Skull: The visualized skull base, calvarium and extracranial soft tissues are normal. Sinuses/Orbits: No fluid levels or advanced mucosal thickening of the  visualized paranasal sinuses. No mastoid or middle ear effusion. The orbits are normal. IMPRESSION: 1. No acute intracranial abnormality. 2. Chronic microvascular ischemia and generalized atrophy. Electronically Signed   By: Deatra Robinson M.D.   On: 10/14/2021 21:22   DG CHEST PORT 1 VIEW  Result Date: 10/15/2021 CLINICAL DATA:  Weakness and dementia. EXAM: PORTABLE CHEST 1 VIEW COMPARISON:  08/06/2021 FINDINGS: Heart size is normal. Aortic atherosclerotic calcifications. There is an opacity within the lateral left lung base which may represent atelectasis, airspace disease or small effusion. Right lung appears clear. No signs of interstitial edema. IMPRESSION: Opacity within the lateral left lung base may represent atelectasis, airspace disease or small effusion. Electronically Signed   By: Signa Kell M.D.   On: 10/15/2021 06:09    Scheduled Meds:  amLODipine  10 mg Oral Daily   apixaban  5 mg Oral BID   flecainide  50 mg Oral Q12H   furosemide  20 mg Oral Daily   lisinopril  20 mg Oral Daily   metoprolol succinate  50 mg Oral Daily   pantoprazole  40 mg Oral Daily   rosuvastatin  20 mg Oral Daily   Continuous Infusions:   LOS: 0 days   Burnadette Pop, MD [facility] P2/15/2023, 8:18 AM

## 2021-10-16 NOTE — NC FL2 (Signed)
[place] LEVEL OF CARE SCREENING TOOL     IDENTIFICATION  Patient Name: Donna Simmons Birthdate: 03-14-1941 Sex: female Admission Date (Current Location): 10/14/2021  [facility] and [place] Number:  Herbalist and Address:  [facility],  [place], [place]      Provider Number: [id number]  Attending Physician Name and Address:  Shelly Coss, MD  Relative Name and Phone Number:  Riena Wilderman (spouse) [phone]    Current Level of Care: Hospital Recommended Level of Care: [place] Prior Approval Number:    Date Approved/Denied:   PASRR Number: [id number]  Discharge Plan: SNF    Current Diagnoses: Patient Active Problem List   Diagnosis Date Noted   GERD (gastroesophageal reflux disease)    Generalized weakness 10/14/2021   Bilateral lower extremity edema 08/19/2020   Other forms of angina pectoris ([place]) 07/25/2018   Long term current use of anticoagulant therapy 11/18/2012   Hypercholesteremia 09/25/2012   DDD (degenerative disc disease), cervical 05/19/2012   Paroxysmal atrial fibrillation ([place]) - CHA2DS2-VASc Score 3, on Eliquis 04/20/2012   Essential hypertension 04/20/2012    Orientation RESPIRATION BLADDER Height & Weight        Normal Incontinent, External catheter (currently with purewick) Weight: 119 lb 4.3 oz (54.1 kg) Height:  5' (152.4 cm)  BEHAVIORAL SYMPTOMS/MOOD NEUROLOGICAL BOWEL NUTRITION STATUS      Incontinent    AMBULATORY STATUS COMMUNICATION OF NEEDS Skin   Extensive Assist   Other (Comment) (laceration Left elbow)                       Personal Care Assistance Level of Assistance  Bathing, Feeding, Dressing Bathing Assistance: Limited assistance Feeding assistance: Limited assistance Dressing Assistance: Limited assistance     Functional Limitations Info             [place]  PT (By licensed PT), OT (By licensed OT)      PT Frequency: 5x/wk OT Frequency: 5x/wk            Contractures Contractures Info: Not present    Additional Factors Info  Code Status, Allergies Code Status Info: Full Allergies Info: Antihistamines, Chlorpheniramine-type           Current Medications (10/16/2021):  This is the current hospital active medication list Current Facility-Administered Medications  Medication Dose Route Frequency Provider Last Rate Last Admin   acetaminophen (TYLENOL) tablet 650 mg  650 mg Oral Q6H PRN Howerter, Justin B, DO       Or   acetaminophen (TYLENOL) suppository 650 mg  650 mg Rectal Q6H PRN Howerter, Justin B, DO       amLODipine (NORVASC) tablet 10 mg  10 mg Oral Daily Howerter, Justin B, DO   10 mg at 10/16/21 1008   apixaban (ELIQUIS) tablet 5 mg  5 mg Oral BID Howerter, Justin B, DO   5 mg at 10/16/21 1008   flecainide (TAMBOCOR) tablet 50 mg  50 mg Oral Q12H Howerter, Justin B, DO   50 mg at 10/16/21 1008   furosemide (LASIX) tablet 20 mg  20 mg Oral Daily Pahwani, Ravi, MD   20 mg at 10/16/21 1008   lisinopril (ZESTRIL) tablet 20 mg  20 mg Oral Daily Howerter, Justin B, DO   20 mg at 10/16/21 1009   metoprolol succinate (TOPROL-XL) 24 hr tablet 50 mg  50 mg Oral Daily Howerter, Justin B, DO   50  mg at 10/16/21 1008   pantoprazole (PROTONIX) EC tablet 40 mg  40 mg Oral Daily Howerter, Justin B, DO   40 mg at 10/16/21 1008   rosuvastatin (CRESTOR) tablet 20 mg  20 mg Oral Daily Howerter, Justin B, DO   20 mg at 10/16/21 1009     Discharge Medications: Please see discharge summary for a list of discharge medications.  Relevant Imaging Results:  Relevant Lab Results:   Additional Information SS# [id number]  Lennart Pall, LCSW

## 2021-10-16 NOTE — Progress Notes (Signed)
Consult for IV start noted. Went in patient's room and noted patient sleeping. Per RN, patient is confused and pulled IV out. No IV fluids or IV medications ordered  and due at this time. Spoke with RN Chelsea to notify IV team when IV access needed immediately.

## 2021-10-16 NOTE — TOC Initial Note (Signed)
Transition of Care [facility]) - Initial/Assessment Note    Patient Details  Name: Donna Simmons MRN: [id number] Date of Birth: Aug 09, 1941  Transition of [facility]) CM/SW Contact:    Amada Jupiter, LCSW Phone Number: 10/16/2021, 3:13 PM  Clinical Narrative:                 Attempted to meet with pt, however, sleeping soundly and, per RN screen, is not fully oriented.  Contacted pt's spouse to discuss TOC order for SNF placement.  Spouse reports that he was unaware of this being the plan but he is in agreement as he cannot meet her current assistance needs. Spouse hopeful to secure a bed in [place].  Will begin SNF process and insurance authorization.  Expected Discharge Plan: Skilled Nursing Facility Barriers to Discharge: Continued Medical Work up, SNF Pending bed offer, Insurance Authorization   Patient Goals and CMS Choice        Expected Discharge Plan and Services Expected Discharge Plan: Skilled Nursing Facility In-house Referral: Clinical Social Work     Living arrangements for the past 2 months: Single Family Home                                      Prior Living Arrangements/Services Living arrangements for the past 2 months: Single Family Home Lives with:: Spouse Patient language and need for interpreter reviewed:: No Do you feel safe going back to the place where you live?:  (unable to assess)      Need for Family Participation in Patient Care: Yes (Comment) Care giver support system in place?: Yes (comment)   Criminal Activity/Legal Involvement Pertinent to Current Situation/Hospitalization: No - Comment as needed  Activities of Daily Living      Permission Sought/Granted Permission sought to share information with : Family Supports    Share Information with NAME: Mesha Schamberger     Permission granted to share info w Relationship: spouse  Permission granted to share info w Contact Information: [phone]  Emotional Assessment Appearance::  Appears stated age Attitude/Demeanor/Rapport: Unable to Assess Affect (typically observed): Unable to Assess Orientation: :  (disoriented x 4 per nursing assessment)   Psych Involvement: No (comment)  Admission diagnosis:  Weakness [R53.1] Generalized weakness [R53.1] Patient Active Problem List   Diagnosis Date Noted   GERD (gastroesophageal reflux disease)    Generalized weakness 10/14/2021   Bilateral lower extremity edema 08/19/2020   Other forms of angina pectoris (HCC) 07/25/2018   Long term current use of anticoagulant therapy 11/18/2012   Hypercholesteremia 09/25/2012   DDD (degenerative disc disease), cervical 05/19/2012   Paroxysmal atrial fibrillation (HCC) - CHA2DS2-VASc Score 3, on Eliquis 04/20/2012   Essential hypertension 04/20/2012   PCP:  Georgina Quint, MD Pharmacy:   [facility] [address], [place] - [address] [place] [address] Phone: [phone] Fax: [phone]     Social Determinants of Health (SDOH) Interventions    Readmission Risk Interventions No flowsheet data found.

## 2021-10-16 NOTE — Evaluation (Signed)
Occupational Therapy Evaluation Patient Details Name: Donna Simmons MRN: [id number] DOB: [birth year] Today's Date: 10/16/2021   History of Present Illness 81 yo female admitted with weakness, inability to ambulate at home. Hx of dementia, Afib   Clinical Impression   Patient is a 81 year old female who was noted to have had a decline in the ability to participate in ADLs. Patient was previously living at home with husband support per chart review. Currently, patient needs mod A for bed mobility and transfers with increased time and cues. Patient  was noted to have decreased activity tolerance, decreased endurance, decreased standing balance, and increased need for sequencing cues impacting participation in ADLs. Patient would continue to benefit from skilled OT services at this time while admitted and after d/c to address noted deficits in order to improve overall safety and independence in ADLs.       Recommendations for follow up therapy are one component of a multi-disciplinary discharge planning process, led by the attending physician.  Recommendations may be updated based on patient status, additional functional criteria and insurance authorization.   Follow Up Recommendations  Skilled nursing-short term rehab (<3 hours/day)    Assistance Recommended at Discharge Frequent or constant Supervision/Assistance  Patient can return home with the following A lot of help with walking and/or transfers;A lot of help with bathing/dressing/bathroom;Assistance with cooking/housework;Direct supervision/assist for financial management;Assist for transportation;Help with stairs or ramp for entrance;Direct supervision/assist for medications management    Functional Status Assessment  Patient has had a recent decline in their functional status and demonstrates the ability to make significant improvements in function in a reasonable and predictable amount of time.  Equipment Recommendations  None  recommended by OT    Recommendations for Other Services       Precautions / Restrictions Precautions Precautions: Fall Precaution Comments: incontinent Restrictions Weight Bearing Restrictions: No      Mobility Bed Mobility Overal bed mobility: Needs Assistance Bed Mobility: Supine to Sit, Sit to Supine     Supine to sit: Mod assist, HOB elevated     General bed mobility comments: with increased time and multimodal cues. patient needed physical assistance for BLE and trunk to transition into midline on edge of bed.    Transfers                          Balance                                           ADL either performed or assessed with clinical judgement   ADL Overall ADL's : Needs assistance/impaired Eating/Feeding: Set up;Minimal assistance;Sitting Eating/Feeding Details (indicate cue type and reason): to attend to task? Grooming: Therapist, nutritional;Wash/dry hands;Sitting Grooming Details (indicate cue type and reason): with increased time EOB Upper Body Bathing: Minimal assistance;Sitting Upper Body Bathing Details (indicate cue type and reason): EOB Lower Body Bathing: Sit to/from stand;Sitting/lateral leans;Maximal assistance   Upper Body Dressing : Minimal assistance;Sitting   Lower Body Dressing: Sit to/from stand;Sitting/lateral leans;Maximal assistance   Toilet Transfer: Minimal assistance;Rolling walker (2 wheels);Ambulation Toilet Transfer Details (indicate cue type and reason): patient was able to transfer from edge of bed to recliner in room with increased time and step by step cues for tasks with RW. patient noted to have poor safety awareness and easily distracted. Toileting- Architect and Hygiene: Sit to/from  stand;Sitting/lateral lean;Maximal assistance       Functional mobility during ADLs: Minimal assistance;Rolling walker (2 wheels)       Vision   Vision Assessment?: No apparent visual  deficits Additional Comments: unable to assess with current cog levels     Perception     Praxis      Pertinent Vitals/Pain Pain Assessment Pain Assessment: Faces Faces Pain Scale: Hurts little more Pain Location: LEs with touch especially RLE Pain Descriptors / Indicators: Discomfort, Grimacing Pain Intervention(s): Limited activity within patient's tolerance, Monitored during session, Repositioned     Hand Dominance     Extremity/Trunk Assessment Upper Extremity Assessment Upper Extremity Assessment: Difficult to assess due to impaired cognition (patient did not complete MMT unable to follow)   Lower Extremity Assessment Lower Extremity Assessment: Defer to PT evaluation   Cervical / Trunk Assessment Cervical / Trunk Assessment: Kyphotic   Communication     Cognition Arousal/Alertness: Awake/alert Behavior During Therapy: WFL for tasks assessed/performed Overall Cognitive Status: History of cognitive impairments - at baseline                                 General Comments: patient follows one step commands with increased time.     General Comments       Exercises     Shoulder Instructions      Home Living Family/patient expects to be discharged to:: Unsure Living Arrangements: Spouse/significant other Available Help at Discharge: Family                                    Prior Functioning/Environment Prior Level of Function : Needs assist             Mobility Comments: no family in room at this time. all infomation above was taken from chart. patient is unable to provide PLOF at this time.          OT Problem List: Decreased activity tolerance;Impaired balance (sitting and/or standing);Decreased safety awareness;Decreased knowledge of precautions;Decreased knowledge of use of DME or AE      OT Treatment/Interventions: Self-care/ADL training;Therapeutic exercise;Neuromuscular education;Therapeutic  activities;Patient/family education;Balance training;DME and/or AE instruction    OT Goals(Current goals can be found in the care plan section) Acute Rehab OT Goals Patient Stated Goal: none stated OT Goal Formulation: Patient unable to participate in goal setting Time For Goal Achievement: 10/30/21 Potential to Achieve Goals: Good  OT Frequency: Min 2X/week    Co-evaluation              AM-PAC OT "6 Clicks" Daily Activity     Outcome Measure Help from another person eating meals?: A Little Help from another person taking care of personal grooming?: A Little Help from another person toileting, which includes using toliet, bedpan, or urinal?: A Lot Help from another person bathing (including washing, rinsing, drying)?: A Lot Help from another person to put on and taking off regular upper body clothing?: A Lot Help from another person to put on and taking off regular lower body clothing?: A Lot 6 Click Score: 14   End of Session Nurse Communication: Mobility status  Activity Tolerance: Patient tolerated treatment well Patient left: in chair;with call bell/phone within reach;with chair alarm set  OT Visit Diagnosis: Unsteadiness on feet (R26.81);Muscle weakness (generalized) (M62.81)  Time: 2122-4825 OT Time Calculation (min): 24 min Charges:  OT General Charges $OT Visit: 1 Visit OT Evaluation $OT Eval Low Complexity: 1 Low OT Treatments $Self Care/Home Management : 8-22 mins  Sharyn Blitz OTR/L, MS Acute Rehabilitation Department Office# [phone] Pager# [phone]   Ardyth Harps 10/16/2021, 11:36 AM

## 2021-10-17 DIAGNOSIS — R531 Weakness: Secondary | ICD-10-CM | POA: Diagnosis not present

## 2021-10-17 LAB — CBC WITH DIFFERENTIAL/PLATELET
Abs Immature Granulocytes: 0.01 10*3/uL (ref 0.00–0.07)
Basophils Absolute: 0 10*3/uL (ref 0.0–0.1)
Basophils Relative: 0 %
Eosinophils Absolute: 0.1 10*3/uL (ref 0.0–0.5)
Eosinophils Relative: 1 %
HCT: 33.4 % — ABNORMAL LOW (ref 36.0–46.0)
Hemoglobin: 10.8 g/dL — ABNORMAL LOW (ref 12.0–15.0)
Immature Granulocytes: 0 %
Lymphocytes Relative: 22 %
Lymphs Abs: 1.2 10*3/uL (ref 0.7–4.0)
MCH: 25.8 pg — ABNORMAL LOW (ref 26.0–34.0)
MCHC: 32.3 g/dL (ref 30.0–36.0)
MCV: 79.9 fL — ABNORMAL LOW (ref 80.0–100.0)
Monocytes Absolute: 0.5 10*3/uL (ref 0.1–1.0)
Monocytes Relative: 9 %
Neutro Abs: 3.6 10*3/uL (ref 1.7–7.7)
Neutrophils Relative %: 68 %
Platelets: 203 10*3/uL (ref 150–400)
RBC: 4.18 MIL/uL (ref 3.87–5.11)
RDW: 15.1 % (ref 11.5–15.5)
WBC: 5.3 10*3/uL (ref 4.0–10.5)
nRBC: 0 % (ref 0.0–0.2)

## 2021-10-17 LAB — BASIC METABOLIC PANEL
Anion gap: 7 (ref 5–15)
BUN: 16 mg/dL (ref 8–23)
CO2: 29 mmol/L (ref 22–32)
Calcium: 8.7 mg/dL — ABNORMAL LOW (ref 8.9–10.3)
Chloride: 102 mmol/L (ref 98–111)
Creatinine, Ser: 0.95 mg/dL (ref 0.44–1.00)
GFR, Estimated: >60 mL/min (ref >60)
Glucose, Bld: 101 mg/dL — ABNORMAL HIGH (ref 70–99)
Potassium: 3 mmol/L — ABNORMAL LOW (ref 3.5–5.1)
Sodium: 138 mmol/L (ref 135–145)

## 2021-10-17 MED ORDER — POTASSIUM CHLORIDE CRYS ER 20 MEQ PO TBCR
40.0000 meq | EXTENDED_RELEASE_TABLET | Freq: Once | ORAL | Status: AC
  Administered 2021-10-17: 40 meq via ORAL
  Filled 2021-10-17: qty 2

## 2021-10-17 MED ORDER — POTASSIUM CHLORIDE CRYS ER 20 MEQ PO TBCR
20.0000 meq | EXTENDED_RELEASE_TABLET | Freq: Once | ORAL | Status: AC
  Administered 2021-10-17: 20 meq via ORAL
  Filled 2021-10-17: qty 1

## 2021-10-17 NOTE — Progress Notes (Signed)
PROGRESS NOTE  Donna Simmons  [id number] DOB: 09/20/1940 DOA: 10/14/2021 PCP: Horald Pollen, MD   Brief Narrative: Patient is a 81 year old female with history of advanced dementia, hypertension, hyperlipidemia, paroxysmal A-fib on Eliquis who presented here from home with complaints of generalized weakness.  At baseline, patient is able to ambulate without assistance including independent transfer.  As per the husband, she has become more weak, unable to ambulate recently.  On presentation she was hemodynamically stable.  CT head did not show any acute intracranial abnormalities.  PT has recommended skilled nursing facility on discharge.  TOC consulted.  Medically stable for discharge whenever possible.  Assessment & Plan:  Principal Problem:   Generalized weakness Active Problems:   Paroxysmal atrial fibrillation (HCC) - CHA2DS2-VASc Score 3, on Eliquis   Essential hypertension   Hypercholesteremia   GERD (gastroesophageal reflux disease)  Generalized weakness: Was fairly independent and was ambulating but became weak recently and was brought from home by her husband. This is most likely generalized deconditioning. PT/OT has recommended skilled nursing facility.    TOC consulted for nursing facility placement.  History of paroxysmal A-fib: Currently rate is controlled.  On normal sinus rhythm.  On beta-blocker, flecainide for rate control, on Eliquis for anticoagulation  Suspected aspiration pneumonia: Chest x-ray showed left lateral lung base opacity.  No signs of hypoxia, no leukocytosis or fever.  Procalcitonin minimally elevated.  No indication for antibiotic therapy  Hyperlipidemia: On statin  GERD: On PPI  Hypertension: Currently blood pressure is controlled.  On lisinopril, amlodipine, Toprol at home.  Being continued  Hypokalemia: Supplemented   History of diastolic congestive heart failure: Echo done on 07/2018 showed EF of 55 to 123456, grade 1 diastolic  dysfunction.  Currently she is euvolemic.  We will stop Lasix for the risk of dehydration.            DVT prophylaxis:SCDs Start: 10/14/21 2307 apixaban (ELIQUIS) tablet 5 mg     Code Status: Full Code  Family Communication: Called and discussed to the husband on phone on 2/16  Patient status:HOme  Patient is from :SNF  Anticipated discharge to: Whenever bed is available at the skilled nursing facility  Estimated DC date:  Whenever bed is available  Consultants: None  Procedures:None  Antimicrobials:  Anti-infectives (From admission, onward)    None       Subjective: Patient seen and examined at the bedside this morning.  Hemodynamically stable.  Comfortable.  Denies any complaints today.  Eating her breakfast  Objective: Vitals:   10/16/21 1004 10/16/21 1316 10/16/21 2049 10/17/21 0919  BP: 125/71 134/77 126/82 (!) 135/92  Pulse: 73  69 72  Resp: 16 16 15    Temp: 97.7 F (36.5 C) 98.3 F (36.8 C) 97.9 F (36.6 C)   TempSrc: Oral Oral Oral   SpO2: 100% 96% 99%   Weight:      Height:        Intake/Output Summary (Last 24 hours) at 10/17/2021 1143 Last data filed at 10/17/2021 1000 Gross per 24 hour  Intake 480 ml  Output 650 ml  Net -170 ml   Filed Weights   10/15/21 1714  Weight: 54.1 kg    Examination:  General exam: Overall comfortable, not in distress, very deconditioned elderly female HEENT: PERRL Respiratory system:  no wheezes or crackles  Cardiovascular system: S1 & S2 heard, RRR.  Gastrointestinal system: Abdomen is nondistended, soft and nontender. Central nervous system: Alert and awake but not oriented Extremities: No edema,  no clubbing ,no cyanosis Skin: No rashes, no ulcers,no icterus     Data Reviewed: I have personally reviewed following labs and imaging studies  CBC: Recent Labs  Lab 10/14/21 1938 10/15/21 0300 10/17/21 0409  WBC 5.7 7.2 5.3  NEUTROABS 4.5 6.0 3.6  HGB 10.8* 10.2* 10.8*  HCT 34.3* 32.2* 33.4*   MCV 82.9 81.7 79.9*  PLT 207 179 123456   Basic Metabolic Panel: Recent Labs  Lab 10/14/21 1900 10/15/21 0300 10/17/21 0409  NA 140 140 138  K 3.6 3.3* 3.0*  CL 108 107 102  CO2 28 26 29   GLUCOSE 91 79 101*  BUN 14 10 16   CREATININE 0.77 0.70 0.95  CALCIUM 9.0 8.8* 8.7*  MG  --  1.9  --      Recent Results (from the past 240 hour(s))  Resp Panel by RT-PCR (Flu A&B, Covid) Nasopharyngeal Swab     Status: None   Collection Time: 10/14/21 11:10 PM   Specimen: Nasopharyngeal Swab; Nasopharyngeal(NP) swabs in vial transport medium  Result Value Ref Range Status   SARS Coronavirus 2 by RT PCR NEGATIVE NEGATIVE Final    Comment: (NOTE) SARS-CoV-2 target nucleic acids are NOT DETECTED.  The SARS-CoV-2 RNA is generally detectable in upper respiratory specimens during the acute phase of infection. The lowest concentration of SARS-CoV-2 viral copies this assay can detect is 138 copies/mL. A negative result does not preclude SARS-Cov-2 infection and should not be used as the sole basis for treatment or other patient management decisions. A negative result may occur with  improper specimen collection/handling, submission of specimen other than nasopharyngeal swab, presence of viral mutation(s) within the areas targeted by this assay, and inadequate number of viral copies(<138 copies/mL). A negative result must be combined with clinical observations, patient history, and epidemiological information. The expected result is Negative.  Fact Sheet for Patients:  EntrepreneurPulse.com.au  Fact Sheet for Healthcare Providers:  IncredibleEmployment.be  This test is no t yet approved or cleared by the [place] FDA and  has been authorized for detection and/or diagnosis of SARS-CoV-2 by FDA under an Emergency Use Authorization (EUA). This EUA will remain  in effect (meaning this test can be used) for the duration of the COVID-19 declaration under  Section 564(b)(1) of the Act, 21 U.S.C.section 360bbb-3(b)(1), unless the authorization is terminated  or revoked sooner.       Influenza A by PCR NEGATIVE NEGATIVE Final   Influenza B by PCR NEGATIVE NEGATIVE Final    Comment: (NOTE) The Xpert Xpress SARS-CoV-2/FLU/RSV plus assay is intended as an aid in the diagnosis of influenza from Nasopharyngeal swab specimens and should not be used as a sole basis for treatment. Nasal washings and aspirates are unacceptable for Xpert Xpress SARS-CoV-2/FLU/RSV testing.  Fact Sheet for Patients: EntrepreneurPulse.com.au  Fact Sheet for Healthcare Providers: IncredibleEmployment.be  This test is not yet approved or cleared by the [place] FDA and has been authorized for detection and/or diagnosis of SARS-CoV-2 by FDA under an Emergency Use Authorization (EUA). This EUA will remain in effect (meaning this test can be used) for the duration of the COVID-19 declaration under Section 564(b)(1) of the Act, 21 U.S.C. section 360bbb-3(b)(1), unless the authorization is terminated or revoked.  Performed at [facility], [place] [address] [place], [place] [address]      Radiology Studies: No results found.  Scheduled Meds:  amLODipine  10 mg Oral Daily   apixaban  5 mg Oral BID   flecainide  50  mg Oral Q12H   lisinopril  20 mg Oral Daily   metoprolol succinate  50 mg Oral Daily   pantoprazole  40 mg Oral Daily   potassium chloride  20 mEq Oral Once   rosuvastatin  20 mg Oral Daily   Continuous Infusions:   LOS: 0 days   Shelly Coss, MD [facility] P2/16/2023, 11:43 AM

## 2021-10-17 NOTE — Plan of Care (Signed)

## 2021-10-17 NOTE — Progress Notes (Signed)
Physical Therapy Treatment Patient Details Name: Donna Simmons MRN: [id number] DOB: September 06, 1940 Today's Date: 10/17/2021   History of Present Illness 81 yo female admitted with weakness, inability to ambulate at home. Hx of dementia, Afib    PT Comments    Pt very pleasant and thankful to get up and get moving. Pt was very hungry and motivated to walk a little and sit up in the recliner to eat lunch. Pt able to follow all commands with verbal or demonstration and sometime placing hands for safety. Walked with RW with verbal cues for increased stepping and safety with ambulation. Feel pt will respond to therapy and continue to get stronger, improve balance and educate family how to cue and help her with mobility. Will continue to follow.     Recommendations for follow up therapy are one component of a multi-disciplinary discharge planning process, led by the attending physician.  Recommendations may be updated based on patient status, additional functional criteria and insurance authorization.  Follow Up Recommendations  Skilled nursing-short term rehab (<3 hours/day)     Assistance Recommended at Discharge Frequent or constant Supervision/Assistance  Patient can return home with the following A little help with bathing/dressing/bathroom;A little help with walking and/or transfers;Assistance with cooking/housework;Assistance with feeding;Direct supervision/assist for medications management;Direct supervision/assist for financial management;Assist for transportation;Help with stairs or ramp for entrance   Equipment Recommendations  Rolling walker (2 wheels)    Recommendations for Other Services       Precautions / Restrictions Precautions Precautions: Fall Precaution Comments: incontinent Restrictions Weight Bearing Restrictions: No     Mobility  Bed Mobility Overal bed mobility: Needs Assistance Bed Mobility: Supine to Sit, Sit to Supine     Supine to sit: HOB elevated, Min  assist     General bed mobility comments: pt able to come to edge of bed with increased time and cues to continue the movment pattern. Some Mon A with scooting to the edge of teh bed to get her feet touchign the floor.    Transfers   Equipment used: Rolling walker (2 wheels) Transfers: Sit to/from Stand Sit to Stand: Min assist, From elevated surface           General transfer comment: cues for hand placment with demonstrating and by placing them there as well. Assist to rise, but once standing wshe was min A to min guard    Ambulation/Gait Ambulation/Gait assistance: Min guard Gait Distance (Feet): 50 Feet           General Gait Details: cues especially initially due to stiff and shuffled gait pattern. WIth more steps and cues for bigger steps, pt was able to get a more conistent gait pattern still with very little hip flexion for clearance and small steps. Shuffling/ festination returned with trunign prior to sitting in recliner. But was able to respond to cues to take bigger steps and complete the turn in front of the recliner.   Stairs             Wheelchair Mobility    Modified Rankin (Stroke Patients Only)       Balance Overall balance assessment: Needs assistance Sitting-balance support: Bilateral upper extremity supported, Feet supported Sitting balance-Leahy Scale: Fair     Standing balance support: Bilateral upper extremity supported, During functional activity Standing balance-Leahy Scale: [place]  Arousal/Alertness: Awake/alert Behavior During Therapy: WFL for tasks assessed/performed Overall Cognitive Status: History of cognitive impairments - at baseline                                 General Comments: pt able to follow all commands from verbal and demontration cues.        Exercises      General Comments        Pertinent Vitals/Pain Pain Assessment Pain Assessment:  Faces Faces Pain Scale: No hurt    Home Living                          Prior Function            PT Goals (current goals can now be found in the care plan section) Acute Rehab PT Goals Patient Stated Goal: none stated PT Goal Formulation: Patient unable to participate in goal setting Time For Goal Achievement: 10/29/21 Potential to Achieve Goals: Fair Progress towards PT goals: Progressing toward goals    Frequency    Min 2X/week      PT Plan      Co-evaluation              AM-PAC PT "6 Clicks" Mobility   Outcome Measure  Help needed turning from your back to your side while in a flat bed without using bedrails?: A Little Help needed moving from lying on your back to sitting on the side of a flat bed without using bedrails?: A Lot Help needed moving to and from a bed to a chair (including a wheelchair)?: A Lot Help needed standing up from a chair using your arms (e.g., wheelchair or bedside chair)?: A Little Help needed to walk in hospital room?: A Little Help needed climbing 3-5 steps with a railing? : A Little 6 Click Score: 16    End of Session Equipment Utilized During Treatment: Gait belt Activity Tolerance: Patient tolerated treatment well Patient left: in bed;with call bell/phone within reach   PT Visit Diagnosis: Muscle weakness (generalized) (M62.81);Difficulty in walking, not elsewhere classified (R26.2)     Time: [id number] PT Time Calculation (min) (ACUTE ONLY): 28 min  Charges:  $Gait Training: 8-22 mins $Therapeutic Activity: 8-22 mins                     Waunita Sandstrom, PT, MPT Acute Rehabilitation Services Office: [phone] Pager: [phone] 10/17/2021    Clide Dales 10/17/2021, 1:18 PM

## 2021-10-17 NOTE — TOC Progression Note (Signed)
Transition of Care [facility]) - Progression Note    Patient Details  Name: Donna Simmons MRN: [id number] Date of Birth: October 26, 1940  Transition [facility] CM/SW Contact  Amada Jupiter, [place] Phone Number: 10/17/2021, 1:12 PM  Clinical Narrative:    I have received 2 SNF bed offers but unable to reach spouse via phone (# does not allow messages to be left).  Need facility choice and pt not able to make this decision due to cognitive impairment.     Expected Discharge Plan: Skilled Nursing Facility Barriers to Discharge: Continued Medical Work up, SNF Pending bed offer, English as a second language teacher  Expected Discharge Plan and Services Expected Discharge Plan: Skilled Nursing Facility In-house Referral: Clinical Social Work     Living arrangements for the past 2 months: Single Family Home                                       Social Determinants of Health (SDOH) Interventions    Readmission Risk Interventions No flowsheet data found.

## 2021-10-18 DIAGNOSIS — R531 Weakness: Secondary | ICD-10-CM | POA: Diagnosis not present

## 2021-10-18 LAB — BASIC METABOLIC PANEL
Anion gap: 6 (ref 5–15)
BUN: 15 mg/dL (ref 8–23)
CO2: 29 mmol/L (ref 22–32)
Calcium: 8.8 mg/dL — ABNORMAL LOW (ref 8.9–10.3)
Chloride: 101 mmol/L (ref 98–111)
Creatinine, Ser: 0.94 mg/dL (ref 0.44–1.00)
GFR, Estimated: >60 mL/min (ref >60)
Glucose, Bld: 96 mg/dL (ref 70–99)
Potassium: 3.8 mmol/L (ref 3.5–5.1)
Sodium: 136 mmol/L (ref 135–145)

## 2021-10-18 NOTE — Progress Notes (Signed)
Occupational Therapy Treatment Patient Details Name: Donna Simmons MRN: [id number] DOB: November 11, 1940 Today's Date: 10/18/2021   History of present illness 81 yo female admitted with weakness, inability to ambulate at home. Hx of dementia, Afib   OT comments  Patient found with legs over bed rail when therapist entered the room. Therapist assisted with flattening the bed and moving bed rail. From edge of bed patient able to slip on shoes and stand with min guard. Patient min assist for ambulation - needing min assist to manage walker (doesn't appear she typically uses a device) and then needed hand hold assist for ambulation without walker. She demonstrated ability to perform toileting and standing at sink with min guard and verbal cues for sequencing. She was able to walk in the hall - with and without walker - in attempt to fetch wash cloths. Patient returned to room and seated in recliner to wash face. Patient able to follow commands with multimodal cues and pleasant. Continue to recommend short term rehab due to still needing min guard to min assist for all tasks and patient's husband unable to provide needed level of assistance.     Recommendations for follow up therapy are one component of a multi-disciplinary discharge planning process, led by the attending physician.  Recommendations may be updated based on patient status, additional functional criteria and insurance authorization.    Follow Up Recommendations  Skilled nursing-short term rehab (<3 hours/day)    Assistance Recommended at Discharge Frequent or constant Supervision/Assistance  Patient can return home with the following  Assistance with cooking/housework;Direct supervision/assist for financial management;Assist for transportation;Help with stairs or ramp for entrance;Direct supervision/assist for medications management;A little help with walking and/or transfers;A little help with bathing/dressing/bathroom   Equipment  Recommendations  None recommended by OT    Recommendations for Other Services      Precautions / Restrictions Precautions Precautions: Fall Precaution Comments: incontinent Restrictions Weight Bearing Restrictions: No       Mobility Bed Mobility                    Transfers                         Balance Overall balance assessment: Mild deficits observed, not formally tested                                         ADL either performed or assessed with clinical judgement   ADL Overall ADL's : Needs assistance/impaired     Grooming: Sitting;Cueing for sequencing;Min guard;Wash/dry hands Grooming Details (indicate cue type and reason): min guard to stand at sink and wash hands - cues to locate soap and paper towel.             Lower Body Dressing: Set up Lower Body Dressing Details (indicate cue type and reason): able to slip feet into slides Toilet Transfer: Minimal assistance;Regular Toilet;Rolling walker (2 wheels);Grab bars Toilet Transfer Details (indicate cue type and reason): Patient able to ambulate in to the bathroom with RW - though it appears patient doesn't typically use a device. She required multimodal cues to transfer safely on to toilet and use grab bar. Toileting- Architect and Hygiene: Min guard;Sit to/from stand Toileting - Clothing Manipulation Details (indicate cue type and reason): Demonstrates ability to manage hospital gown and perform periarea care.  Functional mobility during ADLs: Minimal assistance General ADL Comments: Min assist to ambulate in room and hall. Needs min assist to help manage walker when using it. When walker taken away she needed hand hold assist to maintain balance.    Extremity/Trunk Assessment Upper Extremity Assessment Upper Extremity Assessment: Overall WFL for tasks assessed   Lower Extremity Assessment Lower Extremity Assessment: Defer to PT evaluation   Cervical  / Trunk Assessment Cervical / Trunk Assessment: Kyphotic    Vision   Vision Assessment?: No apparent visual deficits   Perception     Praxis      Cognition Arousal/Alertness: Awake/alert Behavior During Therapy: WFL for tasks assessed/performed Overall Cognitive Status: History of cognitive impairments - at baseline                                 General Comments: pt able to follow all commands from verbal and demontration cues.        Exercises      Shoulder Instructions       General Comments      Pertinent Vitals/ Pain       Pain Assessment Pain Assessment: No/denies pain  Home Living                                          Prior Functioning/Environment              Frequency  Min 2X/week        Progress Toward Goals  OT Goals(current goals can now be found in the care plan section)  Progress towards OT goals: Progressing toward goals  Acute Rehab OT Goals OT Goal Formulation: Patient unable to participate in goal setting Time For Goal Achievement: 10/30/21 Potential to Achieve Goals: Good  Plan Discharge plan remains appropriate    Co-evaluation                 AM-PAC OT "6 Clicks" Daily Activity     Outcome Measure   Help from another person eating meals?: A Little Help from another person taking care of personal grooming?: A Little Help from another person toileting, which includes using toliet, bedpan, or urinal?: A Little Help from another person bathing (including washing, rinsing, drying)?: A Little Help from another person to put on and taking off regular upper body clothing?: A Little Help from another person to put on and taking off regular lower body clothing?: A Little 6 Click Score: 18    End of Session Equipment Utilized During Treatment: Rolling walker (2 wheels);Gait belt  OT Visit Diagnosis: Unsteadiness on feet (R26.81);Muscle weakness (generalized) (M62.81)   Activity  Tolerance Patient tolerated treatment well   Patient Left in chair;with call bell/phone within reach;with chair alarm set   Nurse Communication Mobility status        Time: 0902-0920 OT Time Calculation (min): 18 min  Charges: OT General Charges $OT Visit: 1 Visit OT Treatments $Self Care/Home Management : 8-22 mins  Waldron Session, OTR/L Acute Care Rehab Services  Office [phone] Pager: [phone]   Kelli Churn 10/18/2021, 10:34 AM

## 2021-10-18 NOTE — TOC Transition Note (Signed)
Transition of Care [facility]) - CM/SW Discharge Note   Patient Details  Name: Donna Simmons MRN: [id number] Date of Birth: 08-03-1941  [facility]) CM/SW Contact:  Amada Jupiter, LCSW Phone Number: 10/18/2021, 11:44 AM   Clinical Narrative:    Able to reach spouse yesterday afternoon and he [facility]  Received insurance auth this morning.  Pt medically ready for dc.  PTAR called at 11:45am.  RN to call report to [phone].  No further TOC needs.   Final next level of care: Skilled Nursing Facility Barriers to Discharge: Barriers Resolved   Patient Goals and CMS Choice        Discharge Placement              Patient chooses bed at: [facility] Patient to be transferred to facility by: PTAR Name of family member notified: spouse Patient and family notified of of transfer: 10/18/21  Discharge Plan and Services In-house Referral: Clinical Social Work                                   Social Determinants of Health (SDOH) Interventions     Readmission Risk Interventions No flowsheet data found.

## 2021-10-18 NOTE — Discharge Summary (Signed)
Physician Discharge Summary  Donna Simmons [id number] DOB: [birth year] DOA: 10/14/2021  PCP: Horald Pollen, MD  Admit date: 10/14/2021 Discharge date: 10/18/2021  Admitted From: Home Disposition:  SNF  Discharge Condition:Stable CODE STATUS:FULL Diet recommendation:  Regular    Brief/Interim Summary:  Patient is a 81 year old female with history of advanced dementia, hypertension, hyperlipidemia, paroxysmal A-fib on Eliquis who presented here from home with complaints of generalized weakness.  At baseline, patient is able to ambulate without assistance including independent transfer.  As per the husband, she has become more weak, unable to ambulate recently.  On presentation she was hemodynamically stable.  CT head did not show any acute intracranial abnormalities.  PT has recommended skilled nursing facility on discharge.  TOC consulted.  Medically stable for discharge .  Following problems were addressed during her hospitalization:  Generalized weakness: Was fairly independent and was ambulating but became weak recently and was brought from home by her husband. This is most likely generalized deconditioning. PT/OT has recommended skilled nursing facility.    TOC consulted for nursing facility placement.   History of paroxysmal A-fib: Currently rate is controlled.  On normal sinus rhythm.  On beta-blocker, flecainide for rate control, on Eliquis for anticoagulation   Suspected aspiration pneumonia: Chest x-ray showed left lateral lung base opacity.  No signs of hypoxia, no leukocytosis or fever.  Procalcitonin minimally elevated.  No indication for antibiotic therapy   Hyperlipidemia: On statin   GERD: On PPI   Hypertension: Currently blood pressure is controlled.  On lisinopril, amlodipine, Toprol at home.  Being continued   Hypokalemia: Supplemented    History of diastolic congestive heart failure: Echo done on 07/2018 showed EF of 55 to [id number], grade 1 diastolic  dysfunction.  Currently she is euvolemic.  We will stop Lasix for the risk of dehydration.   Discharge Diagnoses:  Principal Problem:   Generalized weakness Active Problems:   Paroxysmal atrial fibrillation (HCC) - CHA2DS2-VASc Score 3, on Eliquis   Essential hypertension   Hypercholesteremia   GERD (gastroesophageal reflux disease)    Discharge Instructions  Discharge Instructions     Diet general   Complete by: As directed    Discharge instructions   Complete by: As directed    Please take your medications as instructed   Increase activity slowly   Complete by: As directed    No wound care   Complete by: As directed       Allergies as of 10/18/2021       Reactions   Antihistamines, Chlorpheniramine-type Palpitations   Makes heart beat fast        Medication List     STOP taking these medications    cephALEXin 500 MG capsule Commonly known as: KEFLEX   furosemide 20 MG tablet Commonly known as: LASIX   metoprolol tartrate 50 MG tablet Commonly known as: LOPRESSOR   sucralfate 1 GM/10ML suspension Commonly known as: Carafate       TAKE these medications    acetaminophen 325 MG tablet Commonly known as: TYLENOL Take 325-650 mg by mouth daily as needed (pain or headaches).   amLODipine 10 MG tablet Commonly known as: NORVASC Take 10 mg by mouth daily.   Blood Pressure Cuff Misc Take your  blood pressure with blood pressure cuff/moniotor- can be automatic or manual - one to three  times a week as needed   donepezil 5 MG tablet Commonly known as: ARICEPT Take 5 mg by mouth at bedtime.   Eliquis 5  MG Tabs tablet Generic drug: apixaban Take 1 tablet by mouth twice daily What changed: how much to take   flecainide 50 MG tablet Commonly known as: TAMBOCOR TAKE 1 TABLET BY MOUTH TWICE DAILY . APPOINTMENT REQUIRED FOR FUTURE REFILLS What changed: See the new instructions.   fluticasone 50 MCG/ACT nasal spray Commonly known as: FLONASE USE  TWO SPRAY(S) IN EACH NOSTRIL ONCE DAILY What changed: See the new instructions.   lisinopril 20 MG tablet Commonly known as: ZESTRIL Take 1 tablet by mouth once daily   memantine 5 MG tablet Commonly known as: Namenda Take 1 tablet daily for one week, then take 1 tablet twice daily for one week, then take 1 tablet in the morning and 2 in the evening for one week, then take 2 tablets twice daily   metoprolol succinate 50 MG 24 hr tablet Commonly known as: TOPROL-XL TAKE 1 TABLET BY MOUTH ONCE DAILY WITH OR IMMEDIATELY FOLLOWING A MEAL What changed:  how much to take how to take this when to take this additional instructions   Myrbetriq 25 MG Tb24 tablet Generic drug: mirabegron ER Take 25 mg by mouth daily.   omeprazole 40 MG capsule Commonly known as: PRILOSEC Take 40 mg by mouth daily.   Potassium 99 MG Tabs Take 99 mg by mouth daily.   rosuvastatin 20 MG tablet Commonly known as: CRESTOR Take 1 tablet by mouth once daily What changed: when to take this   VITAMIN D3 PO Take 1 tablet by mouth daily.        Allergies  Allergen Reactions   Antihistamines, Chlorpheniramine-Type Palpitations    Makes heart beat fast    Consultations: None   Procedures/Studies: CT Head Wo Contrast  Result Date: 10/14/2021 CLINICAL DATA:  Acute neurologic deficit EXAM: CT HEAD WITHOUT CONTRAST TECHNIQUE: Contiguous axial images were obtained from the base of the skull through the vertex without intravenous contrast. RADIATION DOSE REDUCTION: This exam was performed according to the departmental dose-optimization program which includes automated exposure control, adjustment of the mA and/or kV according to patient size and/or use of iterative reconstruction technique. COMPARISON:  08/06/2021 FINDINGS: Brain: There is no mass, hemorrhage or extra-axial collection. There is generalized atrophy without lobar predilection. Hypodensity of the white matter is most commonly associated with  chronic microvascular disease. Vascular: No abnormal hyperdensity of the major intracranial arteries or dural venous sinuses. No intracranial atherosclerosis. Skull: The visualized skull base, calvarium and extracranial soft tissues are normal. Sinuses/Orbits: No fluid levels or advanced mucosal thickening of the visualized paranasal sinuses. No mastoid or middle ear effusion. The orbits are normal. IMPRESSION: 1. No acute intracranial abnormality. 2. Chronic microvascular ischemia and generalized atrophy. Electronically Signed   By: Ulyses Jarred M.D.   On: 10/14/2021 21:22   DG CHEST PORT 1 VIEW  Result Date: 10/15/2021 CLINICAL DATA:  Weakness and dementia. EXAM: PORTABLE CHEST 1 VIEW COMPARISON:  08/06/2021 FINDINGS: Heart size is normal. Aortic atherosclerotic calcifications. There is an opacity within the lateral left lung base which may represent atelectasis, airspace disease or small effusion. Right lung appears clear. No signs of interstitial edema. IMPRESSION: Opacity within the lateral left lung base may represent atelectasis, airspace disease or small effusion. Electronically Signed   By: Kerby Moors M.D.   On: 10/15/2021 06:09      Subjective: Patient seen and examined at the bedside this morning.  Hemodynamically stable.  Comfortable, lying on bed.  Stable for discharge  Discharge Exam: Vitals:   10/17/21 2238  10/18/21 0641  BP: 122/81 123/73  Pulse: 76 60  Resp: 17 17  Temp: 98.3 F (36.8 C) 97.7 F (36.5 C)  SpO2: 99% 94%   Vitals:   10/17/21 0919 10/17/21 1238 10/17/21 2238 10/18/21 0641  BP: (!) 135/92 96/73 122/81 123/73  Pulse: 72 70 76 60  Resp:  16 17 17   Temp:  (!) 97.2 F (36.2 C) 98.3 F (36.8 C) 97.7 F (36.5 C)  TempSrc:  Oral Oral Oral  SpO2:  94% 99% 94%  Weight:      Height:        General: Pt is alert, awake, not in acute distress Cardiovascular: RRR, S1/S2 +, no rubs, no gallops Respiratory: CTA bilaterally, no wheezing, no rhonchi Abdominal:  Soft, NT, ND, bowel sounds + Extremities: no edema, no cyanosis    The results of significant diagnostics from this hospitalization (including imaging, microbiology, ancillary and laboratory) are listed below for reference.     Microbiology: Recent Results (from the past 240 hour(s))  Resp Panel by RT-PCR (Flu A&B, Covid) Nasopharyngeal Swab     Status: None   Collection Time: 10/14/21 11:10 PM   Specimen: Nasopharyngeal Swab; Nasopharyngeal(NP) swabs in vial transport medium  Result Value Ref Range Status   SARS Coronavirus 2 by RT PCR NEGATIVE NEGATIVE Final    Comment: (NOTE) SARS-CoV-2 target nucleic acids are NOT DETECTED.  The SARS-CoV-2 RNA is generally detectable in upper respiratory specimens during the acute phase of infection. The lowest concentration of SARS-CoV-2 viral copies this assay can detect is 138 copies/mL. A negative result does not preclude SARS-Cov-2 infection and should not be used as the sole basis for treatment or other patient management decisions. A negative result may occur with  improper specimen collection/handling, submission of specimen other than nasopharyngeal swab, presence of viral mutation(s) within the areas targeted by this assay, and inadequate number of viral copies(<138 copies/mL). A negative result must be combined with clinical observations, patient history, and epidemiological information. The expected result is Negative.  Fact Sheet for Patients:  EntrepreneurPulse.com.au  Fact Sheet for Healthcare Providers:  IncredibleEmployment.be  This test is no t yet approved or cleared by the [place] FDA and  has been authorized for detection and/or diagnosis of SARS-CoV-2 by FDA under an Emergency Use Authorization (EUA). This EUA will remain  in effect (meaning this test can be used) for the duration of the COVID-19 declaration under Section 564(b)(1) of the Act, 21 U.S.C.section 360bbb-3(b)(1),  unless the authorization is terminated  or revoked sooner.       Influenza A by PCR NEGATIVE NEGATIVE Final   Influenza B by PCR NEGATIVE NEGATIVE Final    Comment: (NOTE) The Xpert Xpress SARS-CoV-2/FLU/RSV plus assay is intended as an aid in the diagnosis of influenza from Nasopharyngeal swab specimens and should not be used as a sole basis for treatment. Nasal washings and aspirates are unacceptable for Xpert Xpress SARS-CoV-2/FLU/RSV testing.  Fact Sheet for Patients: EntrepreneurPulse.com.au  Fact Sheet for Healthcare Providers: IncredibleEmployment.be  This test is not yet approved or cleared by the [place] FDA and has been authorized for detection and/or diagnosis of SARS-CoV-2 by FDA under an Emergency Use Authorization (EUA). This EUA will remain in effect (meaning this test can be used) for the duration of the COVID-19 declaration under Section 564(b)(1) of the Act, 21 U.S.C. section 360bbb-3(b)(1), unless the authorization is terminated or revoked.  Performed at [facility], [place] [address] [place], [place] [address]  Labs: BNP (last 3 results) No results for input(s): BNP in the last 8760 hours. Basic Metabolic Panel: Recent Labs  Lab 10/14/21 1900 10/15/21 0300 10/17/21 0409 10/18/21 0420  NA 140 140 138 136  K 3.6 3.3* 3.0* 3.8  CL 108 107 102 101  CO2 28 26 29 29   GLUCOSE 91 79 101* 96  BUN 14 10 16 15   CREATININE 0.77 0.70 0.95 0.94  CALCIUM 9.0 8.8* 8.7* 8.8*  MG  --  1.9  --   --    Liver Function Tests: Recent Labs  Lab 10/15/21 0300  AST 24  ALT 13  ALKPHOS 50  BILITOT 0.6  PROT 6.4*  ALBUMIN 3.4*   No results for input(s): LIPASE, AMYLASE in the last 168 hours. No results for input(s): AMMONIA in the last 168 hours. CBC: Recent Labs  Lab 10/14/21 1938 10/15/21 0300 10/17/21 0409  WBC 5.7 7.2 5.3  NEUTROABS 4.5 6.0 3.6  HGB 10.8* 10.2* 10.8*  HCT 34.3* 32.2*  33.4*  MCV 82.9 81.7 79.9*  PLT 207 179 203   Cardiac Enzymes: No results for input(s): CKTOTAL, CKMB, CKMBINDEX, TROPONINI in the last 168 hours. BNP: Invalid input(s): POCBNP CBG: No results for input(s): GLUCAP in the last 168 hours. D-Dimer No results for input(s): DDIMER in the last 72 hours. Hgb A1c No results for input(s): HGBA1C in the last 72 hours. Lipid Profile No results for input(s): CHOL, HDL, LDLCALC, TRIG, CHOLHDL, LDLDIRECT in the last 72 hours. Thyroid function studies No results for input(s): TSH, T4TOTAL, T3FREE, THYROIDAB in the last 72 hours.  Invalid input(s): FREET3 Anemia work up No results for input(s): VITAMINB12, FOLATE, FERRITIN, TIBC, IRON, RETICCTPCT in the last 72 hours. Urinalysis    Component Value Date/Time   COLORURINE YELLOW 10/14/2021 1944   APPEARANCEUR CLEAR 10/14/2021 1944   LABSPEC 1.021 10/14/2021 1944   PHURINE 6.0 10/14/2021 1944   GLUCOSEU NEGATIVE 10/14/2021 1944   HGBUR MODERATE (A) 10/14/2021 1944   BILIRUBINUR NEGATIVE 10/14/2021 1944   BILIRUBINUR negative 06/03/2016 1542   BILIRUBINUR neg 07/21/2014 1622   KETONESUR NEGATIVE 10/14/2021 1944   PROTEINUR 100 (A) 10/14/2021 1944   UROBILINOGEN 0.2 06/03/2016 1542   UROBILINOGEN 0.2 12/20/2015 1535   NITRITE NEGATIVE 10/14/2021 1944   LEUKOCYTESUR SMALL (A) 10/14/2021 1944   Sepsis Labs Invalid input(s): PROCALCITONIN,  WBC,  LACTICIDVEN Microbiology Recent Results (from the past 240 hour(s))  Resp Panel by RT-PCR (Flu A&B, Covid) Nasopharyngeal Swab     Status: None   Collection Time: 10/14/21 11:10 PM   Specimen: Nasopharyngeal Swab; Nasopharyngeal(NP) swabs in vial transport medium  Result Value Ref Range Status   SARS Coronavirus 2 by RT PCR NEGATIVE NEGATIVE Final    Comment: (NOTE) SARS-CoV-2 target nucleic acids are NOT DETECTED.  The SARS-CoV-2 RNA is generally detectable in upper respiratory specimens during the acute phase of infection. The  lowest concentration of SARS-CoV-2 viral copies this assay can detect is 138 copies/mL. A negative result does not preclude SARS-Cov-2 infection and should not be used as the sole basis for treatment or other patient management decisions. A negative result may occur with  improper specimen collection/handling, submission of specimen other than nasopharyngeal swab, presence of viral mutation(s) within the areas targeted by this assay, and inadequate number of viral copies(<138 copies/mL). A negative result must be combined with clinical observations, patient history, and epidemiological information. The expected result is Negative.  Fact Sheet for Patients:  EntrepreneurPulse.com.au  Fact Sheet for Healthcare Providers:  IncredibleEmployment.be  This test is no t yet approved or cleared by the [place] and  has been authorized for detection and/or diagnosis of SARS-CoV-2 by FDA under an Emergency Use Authorization (EUA). This EUA will remain  in effect (meaning this test can be used) for the duration of the COVID-19 declaration under Section 564(b)(1) of the Act, 21 U.S.C.section 360bbb-3(b)(1), unless the authorization is terminated  or revoked sooner.       Influenza A by PCR NEGATIVE NEGATIVE Final   Influenza B by PCR NEGATIVE NEGATIVE Final    Comment: (NOTE) The Xpert Xpress SARS-CoV-2/FLU/RSV plus assay is intended as an aid in the diagnosis of influenza from Nasopharyngeal swab specimens and should not be used as a sole basis for treatment. Nasal washings and aspirates are unacceptable for Xpert Xpress SARS-CoV-2/FLU/RSV testing.  Fact Sheet for Patients: EntrepreneurPulse.com.au  Fact Sheet for Healthcare Providers: IncredibleEmployment.be  This test is not yet approved or cleared by the [place] FDA and has been authorized for detection and/or diagnosis of SARS-CoV-2 by FDA under  an Emergency Use Authorization (EUA). This EUA will remain in effect (meaning this test can be used) for the duration of the COVID-19 declaration under Section 564(b)(1) of the Act, 21 U.S.C. section 360bbb-3(b)(1), unless the authorization is terminated or revoked.  Performed at [facility], [place] [address] [place], [place] [address]     Please note: You were cared for by a hospitalist during your hospital stay. Once you are discharged, your primary care physician will handle any further medical issues. Please note that NO REFILLS for any discharge medications will be authorized once you are discharged, as it is imperative that you return to your primary care physician (or establish a relationship with a primary care physician if you do not have one) for your post hospital discharge needs so that they can reassess your need for medications and monitor your lab values.    Time coordinating discharge: 40 minutes  SIGNED:   Shelly Coss, MD  [facility] 10/18/2021, 11:27 AM Pager [phone]  If 7PM-7AM, please contact night-coverage [email or web address] Password [email or web address]

## 2021-10-18 NOTE — Plan of Care (Signed)
Discharge instructions and report were given to facility. All questions were answered. Patient was transported to facility by [facility]. ? ?

## 2021-10-24 LAB — METHYLMALONIC ACID, SERUM: Methylmalonic Acid, Quantitative: 147 nmol/L (ref 0–378)

## 2021-11-19 ENCOUNTER — Telehealth: Payer: Self-pay | Admitting: Emergency Medicine

## 2021-11-19 NOTE — Telephone Encounter (Signed)
Okay to place order for evaluation.  Thanks.

## 2021-11-19 NOTE — Telephone Encounter (Signed)
Called Melissa from [facility] to provider verbal orders per provider. Unable to leave message mail box is full  ?

## 2021-11-19 NOTE — Telephone Encounter (Signed)
Home Health verbal orders-caller/Agency: Melissa/ [facility] ? ?Callback number: [phone] ? ?Requesting OT/PT/Skilled nursing/Social Work/Speech: OT ? ?Reason: Evaluation ? ?Rep states she is calling on behalf of pt ? ?Advised rep pt may need an appt ? ?Lov 11-05-2020 ?

## 2021-11-22 NOTE — Telephone Encounter (Signed)
Called Melissa at [facility] to ok verbals orders for that patient per provider ?

## 2021-12-03 ENCOUNTER — Emergency Department (HOSPITAL_COMMUNITY): Payer: Medicare PPO

## 2021-12-03 ENCOUNTER — Observation Stay (HOSPITAL_COMMUNITY)
Admission: EM | Admit: 2021-12-03 | Discharge: 2021-12-07 | Disposition: A | Discharge status: Skilled Nursing Facility | Payer: Medicare PPO | Attending: Internal Medicine | Admitting: Internal Medicine

## 2021-12-03 ENCOUNTER — Encounter (HOSPITAL_COMMUNITY): Payer: Self-pay | Admitting: Emergency Medicine

## 2021-12-03 ENCOUNTER — Other Ambulatory Visit: Payer: Self-pay

## 2021-12-03 DIAGNOSIS — M6281 Muscle weakness (generalized): Secondary | ICD-10-CM | POA: Insufficient documentation

## 2021-12-03 DIAGNOSIS — I10 Essential (primary) hypertension: Secondary | ICD-10-CM | POA: Insufficient documentation

## 2021-12-03 DIAGNOSIS — Z7901 Long term (current) use of anticoagulants: Secondary | ICD-10-CM | POA: Diagnosis not present

## 2021-12-03 DIAGNOSIS — R4182 Altered mental status, unspecified: Secondary | ICD-10-CM | POA: Diagnosis present

## 2021-12-03 DIAGNOSIS — Z20822 Contact with and (suspected) exposure to covid-19: Secondary | ICD-10-CM | POA: Insufficient documentation

## 2021-12-03 DIAGNOSIS — F0390 Unspecified dementia without behavioral disturbance: Secondary | ICD-10-CM | POA: Diagnosis not present

## 2021-12-03 DIAGNOSIS — Z79899 Other long term (current) drug therapy: Secondary | ICD-10-CM | POA: Diagnosis not present

## 2021-12-03 DIAGNOSIS — G9341 Metabolic encephalopathy: Principal | ICD-10-CM | POA: Insufficient documentation

## 2021-12-03 DIAGNOSIS — I480 Paroxysmal atrial fibrillation: Secondary | ICD-10-CM | POA: Diagnosis not present

## 2021-12-03 DIAGNOSIS — R531 Weakness: Secondary | ICD-10-CM

## 2021-12-03 LAB — COMPREHENSIVE METABOLIC PANEL
ALT: 20 U/L (ref 0–44)
AST: 22 U/L (ref 15–41)
Albumin: 3.4 g/dL — ABNORMAL LOW (ref 3.5–5.0)
Alkaline Phosphatase: 54 U/L (ref 38–126)
Anion gap: 4 — ABNORMAL LOW (ref 5–15)
BUN: 15 mg/dL (ref 8–23)
CO2: 28 mmol/L (ref 22–32)
Calcium: 8.6 mg/dL — ABNORMAL LOW (ref 8.9–10.3)
Chloride: 110 mmol/L (ref 98–111)
Creatinine, Ser: 0.94 mg/dL (ref 0.44–1.00)
GFR, Estimated: >60 mL/min (ref >60)
Glucose, Bld: 94 mg/dL (ref 70–99)
Potassium: 3.7 mmol/L (ref 3.5–5.1)
Sodium: 142 mmol/L (ref 135–145)
Total Bilirubin: 0.2 mg/dL — ABNORMAL LOW (ref 0.3–1.2)
Total Protein: 6.6 g/dL (ref 6.5–8.1)

## 2021-12-03 LAB — URINALYSIS, ROUTINE W REFLEX MICROSCOPIC
Bilirubin Urine: NEGATIVE
Glucose, UA: NEGATIVE mg/dL
Ketones, ur: NEGATIVE mg/dL
Nitrite: NEGATIVE
Protein, ur: NEGATIVE mg/dL
Specific Gravity, Urine: 1.016 (ref 1.005–1.030)
pH: 7 (ref 5.0–8.0)

## 2021-12-03 LAB — RESP PANEL BY RT-PCR (FLU A&B, COVID) ARPGX2
Influenza A by PCR: NEGATIVE
Influenza B by PCR: NEGATIVE
SARS Coronavirus 2 by RT PCR: NEGATIVE

## 2021-12-03 LAB — CBC WITH DIFFERENTIAL/PLATELET
Abs Immature Granulocytes: 0 10*3/uL (ref 0.00–0.07)
Basophils Absolute: 0 10*3/uL (ref 0.0–0.1)
Basophils Relative: 0 %
Eosinophils Absolute: 0 10*3/uL (ref 0.0–0.5)
Eosinophils Relative: 1 %
HCT: 36.4 % (ref 36.0–46.0)
Hemoglobin: 11.1 g/dL — ABNORMAL LOW (ref 12.0–15.0)
Immature Granulocytes: 0 %
Lymphocytes Relative: 29 %
Lymphs Abs: 1 10*3/uL (ref 0.7–4.0)
MCH: 25.6 pg — ABNORMAL LOW (ref 26.0–34.0)
MCHC: 30.5 g/dL (ref 30.0–36.0)
MCV: 84.1 fL (ref 80.0–100.0)
Monocytes Absolute: 0.2 10*3/uL (ref 0.1–1.0)
Monocytes Relative: 7 %
Neutro Abs: 2.1 10*3/uL (ref 1.7–7.7)
Neutrophils Relative %: 63 %
Platelets: 175 10*3/uL (ref 150–400)
RBC: 4.33 MIL/uL (ref 3.87–5.11)
RDW: 16.3 % — ABNORMAL HIGH (ref 11.5–15.5)
WBC: 3.3 10*3/uL — ABNORMAL LOW (ref 4.0–10.5)
nRBC: 0 % (ref 0.0–0.2)

## 2021-12-03 LAB — CBG MONITORING, ED: Glucose-Capillary: 77 mg/dL (ref 70–99)

## 2021-12-03 LAB — AMMONIA: Ammonia: 21 umol/L (ref 9–35)

## 2021-12-03 MED ORDER — METOPROLOL SUCCINATE ER 50 MG PO TB24
50.0000 mg | ORAL_TABLET | Freq: Every day | ORAL | Status: DC
  Administered 2021-12-04 – 2021-12-07 (×3): 50 mg via ORAL
  Filled 2021-12-03 (×4): qty 1

## 2021-12-03 MED ORDER — APIXABAN 5 MG PO TABS
5.0000 mg | ORAL_TABLET | Freq: Two times a day (BID) | ORAL | Status: DC
  Administered 2021-12-04 – 2021-12-07 (×7): 5 mg via ORAL
  Filled 2021-12-03 (×7): qty 1

## 2021-12-03 MED ORDER — DONEPEZIL HCL 10 MG PO TABS
5.0000 mg | ORAL_TABLET | Freq: Every day | ORAL | Status: DC
Start: 2021-12-03 — End: 2021-12-07
  Administered 2021-12-04 – 2021-12-06 (×3): 5 mg via ORAL
  Filled 2021-12-03 (×3): qty 1

## 2021-12-03 MED ORDER — PANTOPRAZOLE SODIUM 40 MG PO TBEC
40.0000 mg | DELAYED_RELEASE_TABLET | Freq: Every day | ORAL | Status: DC
  Administered 2021-12-04 – 2021-12-07 (×4): 40 mg via ORAL
  Filled 2021-12-03 (×4): qty 1

## 2021-12-03 MED ORDER — FLECAINIDE ACETATE 50 MG PO TABS
50.0000 mg | ORAL_TABLET | Freq: Two times a day (BID) | ORAL | Status: DC
  Administered 2021-12-04 – 2021-12-07 (×7): 50 mg via ORAL
  Filled 2021-12-03 (×8): qty 1

## 2021-12-03 MED ORDER — SODIUM CHLORIDE 0.9 % IV SOLN: INTRAVENOUS | Status: AC

## 2021-12-03 MED ORDER — ACETAMINOPHEN 325 MG PO TABS: 325.0000 mg | ORAL_TABLET | Freq: Every day | ORAL | Status: DC | PRN

## 2021-12-03 MED ORDER — MIRABEGRON ER 25 MG PO TB24
25.0000 mg | ORAL_TABLET | Freq: Every day | ORAL | Status: DC
  Administered 2021-12-04 – 2021-12-07 (×4): 25 mg via ORAL
  Filled 2021-12-03 (×4): qty 1

## 2021-12-03 MED ORDER — AMLODIPINE BESYLATE 10 MG PO TABS
10.0000 mg | ORAL_TABLET | Freq: Every day | ORAL | Status: DC
  Administered 2021-12-04 – 2021-12-05 (×2): 10 mg via ORAL
  Filled 2021-12-03 (×3): qty 1

## 2021-12-03 MED ORDER — LISINOPRIL 20 MG PO TABS
20.0000 mg | ORAL_TABLET | Freq: Every day | ORAL | Status: DC
Start: 2021-12-03 — End: 2021-12-06
  Administered 2021-12-04 – 2021-12-05 (×2): 20 mg via ORAL
  Filled 2021-12-03 (×3): qty 1

## 2021-12-03 MED ORDER — ROSUVASTATIN CALCIUM 20 MG PO TABS
20.0000 mg | ORAL_TABLET | Freq: Every day | ORAL | Status: DC
  Administered 2021-12-04 – 2021-12-06 (×3): 20 mg via ORAL
  Filled 2021-12-03 (×3): qty 1

## 2021-12-03 NOTE — H&P (Signed)
?History and Physical  ? ? ?Donna Simmons [id number] DOB: [birth year] DOA: 12/03/2021 ? ?PCP: [name], DO (Confirm with patient/family/NH records and if not entered, this has to be entered at [facility] point of entry) ?Patient coming from: Home ?I have personally briefly reviewed patient's old medical records in [place] ? ?Chief Complaint: Feeling weak ? ?HPI: Donna Simmons is a 81 y.o. female with medical history significant of advanced dementia, PAF on Eliquis, HTN, HLD, GERD was sent by her family for evaluation for altered mentation. ? ?Husband provided all history via phone.  Husband reported that, at baseline, patient able to feed herself, goes to bathroom herself and changing clothes herself.  However for last 3 days, patient mainly steam baths, has not been eating or drinking much and especially, patient became urinary incontinent.  Patient however denies any pain, denied any urinary symptoms or abdominal pain.  Husband himself has been having trouble with his leg and unable to attend the patient for the last 2 days and concern of the patient " must be dehydrated".  Patient complains about feeling generalized weakness but denies any specific arm or leg weakness. ? ?ED Course: Afebrile, CT head showed no acute findings but chronic atrophy.  Normal pressure hydrocephalus cannot be occluded. ? ?WBC, kidney function within normal limits. ? ?Review of Systems: Unable to perform, patient confused. ? ?Past Medical History:  ?Diagnosis Date  ? Allergy   ? GERD (gastroesophageal reflux disease)   ? Heart murmur   ? No significant valvular lesion noted on echo.  ? Hypercholesteremia   ? Hypertension   ? Memory loss   ? Paroxysmal atrial fibrillation ([place]) 12/27/2008  ? ? ?Past Surgical History:  ?Procedure Laterality Date  ? ABDOMINAL HYSTERECTOMY  1980  ? BREAST SURGERY    ? CT CTA CORONARY W/CA SCORE W/CM &/OR WO/CM  08/2018  ? Coronary calcium score 24.  Nonobstructive CAD with less than 30% plaque in  proximal LAD.  Normal aortic root. ->  Relook in October 2020 had significant motion artifact but coronary calcium score 26.  ? EYE SURGERY Right 12/29/2016  ? EYE SURGERY Left 01/19/2017  ? FRACTURE SURGERY    ? LEFT HEART CATH AND CORONARY ANGIOGRAPHY  12/16/2010  ? no evidence of CAD to explain anginal pain w/ positive troponin.  potential etiology is breakthrough AF  ? NM MYOVIEW LTD  April 2010  ? EF 64%, normal pattern of perfusion in all regions, no scintigraphic evidence of inducible ischemia; no significant wall motion abnormalities; EKG negative for ischemia; no significant change from last study; low risk scan  ? TRANSTHORACIC ECHOCARDIOGRAM  07/2018  ? Normal EF 55-60%.  No RWMA. GR 1 DD. Ao Sclerosis - no Stenosis. Mild AI. Trivial MR.   ? ? ? reports that she has never smoked. She has never used smokeless tobacco. She reports that she does not drink alcohol and does not use drugs. ? ?Allergies  ?Allergen Reactions  ? Antihistamines, Chlorpheniramine-Type Palpitations  ?  Makes heart beat fast  ? ? ?Family History  ?Problem Relation Age of Onset  ? Arthritis Mother   ?     OSTEO  ? Hypertension Mother   ? Dementia Mother   ? Heart disease Father   ? Hypertension Sister   ? Dementia Sister   ? Cancer - Lung Brother   ? Cancer Maternal Grandmother   ? Heart disease Maternal Grandfather   ? Cancer - Lung Brother   ?  Hypertension Brother   ? Cancer - Prostate Brother   ? ? ? ?Prior to Admission medications   ?Medication Sig Start Date End Date Taking? Authorizing Provider  ?acetaminophen (TYLENOL) 325 MG tablet Take 325-650 mg by mouth daily as needed (pain or headaches).     [provider]  ?amLODipine (NORVASC) 10 MG tablet Take 10 mg by mouth daily. 09/14/17   [provider]  ?apixaban (ELIQUIS) 5 MG TABS tablet Take 1 tablet by mouth twice daily ?Patient taking differently: Take 5 mg by mouth 2 (two) times daily. 08/08/21   Leonie Man, MD  ?Blood Pressure Monitoring (BLOOD  PRESSURE CUFF) MISC Take your  blood pressure with blood pressure cuff/moniotor- can be automatic or manual - one to three  times a week as needed 04/20/19   Leonie Man, MD  ?Cholecalciferol (VITAMIN D3 PO) Take 1 tablet by mouth daily.    [provider]  ?donepezil (ARICEPT) 5 MG tablet Take 5 mg by mouth at bedtime. 07/22/21   [provider]  ?flecainide (TAMBOCOR) 50 MG tablet TAKE 1 TABLET BY MOUTH TWICE DAILY . APPOINTMENT REQUIRED FOR FUTURE REFILLS ?Patient taking differently: Take 50 mg by mouth 2 (two) times daily. 01/14/21   Leonie Man, MD  ?fluticasone (FLONASE) 50 MCG/ACT nasal spray USE TWO SPRAY(S) IN EACH NOSTRIL ONCE DAILY ?Patient taking differently: Place 1 spray into both nostrils daily as needed for allergies. 01/05/17   Wendie Agreste, MD  ?lisinopril (ZESTRIL) 20 MG tablet Take 1 tablet by mouth once daily ?Patient taking differently: Take 20 mg by mouth daily. 08/14/20   Leonie Man, MD  ?memantine (NAMENDA) 5 MG tablet Take 1 tablet daily for one week, then take 1 tablet twice daily for one week, then take 1 tablet in the morning and 2 in the evening for one week, then take 2 tablets twice daily ?Patient not taking: Reported on 10/15/2021 07/27/19   Kathrynn Ducking, MD  ?metoprolol succinate (TOPROL-XL) 50 MG 24 hr tablet TAKE 1 TABLET BY MOUTH ONCE DAILY WITH OR IMMEDIATELY FOLLOWING A MEAL ?Patient taking differently: Take 50 mg by mouth daily. 04/02/21   Leonie Man, MD  ?MYRBETRIQ 25 MG TB24 tablet Take 25 mg by mouth daily. 09/15/20   [provider]  ?omeprazole (PRILOSEC) 40 MG capsule Take 40 mg by mouth daily.    [provider]  ?Potassium 99 MG TABS Take 99 mg by mouth daily.    [provider]  ?rosuvastatin (CRESTOR) 20 MG tablet Take 1 tablet by mouth once daily ?Patient taking differently: Take 20 mg by mouth at bedtime. 08/08/20   Leonie Man, MD  ? ? ?Physical Exam: ?Vitals:  ? 12/03/21 1900 12/03/21 1930  12/03/21 2000 12/03/21 2030  ?BP: (!) 143/76 (!) 152/70 (!) 156/77 (!) 177/123  ?Pulse: (!) 53 (!) 55 (!) 55 (!) 54  ?Resp: 13 16 18 18   ?Temp:      ?TempSrc:      ?SpO2: 99% 100%  96%  ? ? ?Constitutional: NAD, calm, comfortable ?Vitals:  ? 12/03/21 1900 12/03/21 1930 12/03/21 2000 12/03/21 2030  ?BP: (!) 143/76 (!) 152/70 (!) 156/77 (!) 177/123  ?Pulse: (!) 53 (!) 55 (!) 55 (!) 54  ?Resp: 13 16 18 18   ?Temp:      ?TempSrc:      ?SpO2: 99% 100%  96%  ? ?Eyes: PERRL, lids and conjunctivae normal ?ENMT: Mucous membranes are dry. Posterior pharynx clear of  any exudate or lesions.Normal dentition.  ?Neck: normal, supple, no masses, no thyromegaly ?Respiratory: clear to auscultation bilaterally, no wheezing, no crackles. Normal respiratory effort. No accessory muscle use.  ?Cardiovascular: Regular rate and rhythm, no murmurs / rubs / gallops. No extremity edema. 2+ pedal pulses. No carotid bruits.  ?Abdomen: no tenderness, no masses palpated. No hepatosplenomegaly. Bowel sounds positive.  ?Musculoskeletal: no clubbing / cyanosis. No joint deformity upper and lower extremities. Good ROM, no contractures. Normal muscle tone.  ?Skin: no rashes, lesions, ulcers. No induration ?Neurologic: CN 2-12 grossly intact. Sensation intact, DTR normal. Strength 5/5 in all 4.  ?Psychiatric: Oriented to herself, confused about time and place ? ? ? ?Labs on Admission: I have personally reviewed following labs and imaging studies ? ?CBC: ?Recent Labs  ?Lab 12/03/21 ?1840  ?WBC 3.3*  ?NEUTROABS 2.1  ?HGB 11.1*  ?HCT 36.4  ?MCV 84.1  ?PLT 175  ? ?Basic Metabolic Panel: ?Recent Labs  ?Lab 12/03/21 ?1840  ?NA 142  ?K 3.7  ?CL 110  ?CO2 28  ?GLUCOSE 94  ?BUN 15  ?CREATININE 0.94  ?CALCIUM 8.6*  ? ?GFR: ?CrCl cannot be calculated (Unknown ideal weight.). ?Liver Function Tests: ?Recent Labs  ?Lab 12/03/21 ?1840  ?AST 22  ?ALT 20  ?ALKPHOS 54  ?BILITOT 0.2*  ?PROT 6.6  ?ALBUMIN 3.4*  ? ?No results for input(s): LIPASE, AMYLASE in the last 168  hours. ?Recent Labs  ?Lab 12/03/21 ?1840  ?AMMONIA 21  ? ?Coagulation Profile: ?No results for input(s): INR, PROTIME in the last 168 hours. ?Cardiac Enzymes: ?No results for input(s): CKTOTAL, CKMB, CKMBINDEX,

## 2021-12-03 NOTE — ED Triage Notes (Signed)
Patient presents from home due to general weakness and increased confusion for the past 3 days. Patient lives with her husband who gave EMS report. Alert to person only.  ? ?HX Dementia ? ? ?EMS vitals: ?138/78 ?56 HR ?18 RR ?99% RA ?102 CBG ? ?

## 2021-12-03 NOTE — ED Notes (Signed)
Pt care taken, took patient to the bathroom, collected urine sample, no other complaints. ?

## 2021-12-03 NOTE — ED Provider Notes (Signed)
? [facility]Provider Note ? ? ?CSN: [id number] ?Arrival date & time: 12/03/21  1721 ? ?  ? ?History ? ?Chief Complaint  ?Patient [id number] with  ? Altered Mental Status  ? Weakness  ? ? ?Donna Simmons is a 81 y.o. female.  Level 5 caveat secondary to dementia.  Patient is brought in by EMS from home for worsening weakness and confusion.  Patient herself denies any complaints.  Husband states that she has been refusing to eat and refusing her medications and has been incontinent of urine for the last 3 days.  He said she is usually not oriented but seems more confused recently. ? ?The history is provided by the patient, the spouse and the EMS personnel.  ?Altered Mental Status ?Presenting symptoms: confusion and disorientation   ?Most recent episode:  More than 2 days ago ?Episode history:  Continuous ?Timing:  Constant ?Progression:  Unchanged ?Chronicity:  New ?Context: dementia and not taking medications as prescribed   ?Associated symptoms: weakness   ?Associated symptoms: no abdominal pain, no headaches and no vomiting   ?Weakness ?Associated symptoms: no abdominal pain, no headaches and no vomiting   ? ?  ? ?Home Medications ?Prior to Admission medications   ?Medication Sig Start Date End Date Taking? Authorizing Provider  ?acetaminophen (TYLENOL) 325 MG tablet Take 325-650 mg by mouth daily as needed (pain or headaches).     [provider]  ?amLODipine (NORVASC) 10 MG tablet Take 10 mg by mouth daily. 09/14/17   [provider]  ?apixaban (ELIQUIS) 5 MG TABS tablet Take 1 tablet by mouth twice daily ?Patient taking differently: Take 5 mg by mouth 2 (two) times daily. 08/08/21   Marykay LexHarding, David W, MD  ?Blood Pressure Monitoring (BLOOD PRESSURE CUFF) MISC Take your  blood pressure with blood pressure cuff/moniotor- can be automatic or manual - one to three  times a week as needed 04/20/19   Marykay LexHarding, David W, MD  ?Cholecalciferol (VITAMIN D3 PO) Take 1 tablet by  mouth daily.    [provider]  ?donepezil (ARICEPT) 5 MG tablet Take 5 mg by mouth at bedtime. 07/22/21   [provider]  ?flecainide (TAMBOCOR) 50 MG tablet TAKE 1 TABLET BY MOUTH TWICE DAILY . APPOINTMENT REQUIRED FOR FUTURE REFILLS ?Patient taking differently: Take 50 mg by mouth 2 (two) times daily. 01/14/21   Marykay LexHarding, David W, MD  ?fluticasone (FLONASE) 50 MCG/ACT nasal spray USE TWO SPRAY(S) IN EACH NOSTRIL ONCE DAILY ?Patient taking differently: Place 1 spray into both nostrils daily as needed for allergies. 01/05/17   Shade FloodGreene, Jeffrey R, MD  ?lisinopril (ZESTRIL) 20 MG tablet Take 1 tablet by mouth once daily ?Patient taking differently: Take 20 mg by mouth daily. 08/14/20   Marykay LexHarding, David W, MD  ?memantine (NAMENDA) 5 MG tablet Take 1 tablet daily for one week, then take 1 tablet twice daily for one week, then take 1 tablet in the morning and 2 in the evening for one week, then take 2 tablets twice daily ?Patient not taking: Reported on 10/15/2021 07/27/19   York SpanielWillis, Charles K, MD  ?metoprolol succinate (TOPROL-XL) 50 MG 24 hr tablet TAKE 1 TABLET BY MOUTH ONCE DAILY WITH OR IMMEDIATELY FOLLOWING A MEAL ?Patient taking differently: Take 50 mg by mouth daily. 04/02/21   Marykay LexHarding, David W, MD  ?MYRBETRIQ 25 MG TB24 tablet Take 25 mg by mouth daily. 09/15/20   [provider]  ?omeprazole (PRILOSEC) 40 MG capsule Take 40 mg by mouth daily.  [provider]  ?Potassium 99 MG TABS Take 99 mg by mouth daily.    [provider]  ?rosuvastatin (CRESTOR) 20 MG tablet Take 1 tablet by mouth once daily ?Patient taking differently: Take 20 mg by mouth at bedtime. 08/08/20   Marykay Lex, MD  ?   ? ?Allergies    ?Antihistamines, chlorpheniramine-type   ? ?Review of Systems   ?Review of Systems  ?Unable to perform ROS: Dementia  ?Gastrointestinal:  Negative for abdominal pain and vomiting.  ?Neurological:  Positive for weakness. Negative for headaches.  ?Psychiatric/Behavioral:   Positive for confusion.   ? ?Physical Exam ?Updated Vital Signs ?BP 140/72 (BP Location: Right Arm)   Pulse (!) 56   Temp 97.9 ?F (36.6 ?C) (Oral)   Resp 14   SpO2 98%  ?Physical Exam ?Vitals and nursing note reviewed.  ?Constitutional:   ?   General: She is not in acute distress. ?   Appearance: Normal appearance. She is well-developed.  ?HENT:  ?   Head: Normocephalic and atraumatic.  ?Eyes:  ?   Conjunctiva/sclera: Conjunctivae normal.  ?Cardiovascular:  ?   Rate and Rhythm: Normal rate and regular rhythm.  ?   Heart sounds: No murmur heard. ?Pulmonary:  ?   Effort: Pulmonary effort is normal. No respiratory distress.  ?   Breath sounds: Normal breath sounds.  ?Abdominal:  ?   Palpations: Abdomen is soft.  ?   Tenderness: There is no abdominal tenderness. There is no guarding or rebound.  ?Musculoskeletal:     ?   General: No swelling.  ?   Cervical back: Neck supple.  ?   Right lower leg: Edema present.  ?   Left lower leg: Edema present.  ?Skin: ?   General: Skin is warm and dry.  ?   Capillary Refill: Capillary refill takes less than 2 seconds.  ?Neurological:  ?   General: No focal deficit present.  ?   Mental Status: She is alert. She is disoriented.  ?   Sensory: No sensory deficit.  ?   Motor: No weakness.  ? ? ?ED Results / Procedures / Treatments   ?Labs ?(all labs ordered are listed, but only abnormal results are displayed) ?Labs Reviewed  ?CBC WITH DIFFERENTIAL/PLATELET - Abnormal; Notable for the following components:  ?    Result Value  ? WBC 3.3 (*)   ? Hemoglobin 11.1 (*)   ? MCH 25.6 (*)   ? RDW 16.3 (*)   ? All other components within normal limits  ?COMPREHENSIVE METABOLIC PANEL - Abnormal; Notable for the following components:  ? Calcium 8.6 (*)   ? Albumin 3.4 (*)   ? Total Bilirubin 0.2 (*)   ? Anion gap 4 (*)   ? All other components within normal limits  ?URINALYSIS, ROUTINE W REFLEX MICROSCOPIC - Abnormal; Notable for the following components:  ? Hgb urine dipstick MODERATE (*)   ?  Leukocytes,Ua LARGE (*)   ? Bacteria, UA RARE (*)   ? All other components within normal limits  ?RESP PANEL BY RT-PCR (FLU A&B, COVID) [id number]  ?URINE CULTURE  ?AMMONIA  ?CBG MONITORING, ED  ? ? ?EKG ?EKG Interpretation ? ?Date/Time:  Tuesday December 03 2021 17:34:35 EDT ?Ventricular Rate:  56 ?PR Interval:  184 ?QRS Duration: 91 ?QT Interval:  445 ?QTC Calculation: 430 ?R Axis:   -1 ?Text Interpretation: Sinus rhythm No significant change since prior 12/22 Confirmed by Meridee Score [phone]) on 12/03/2021 7:26:13 PM ? ?Radiology ?CT  Head Wo Contrast ? ?Result Date: 12/03/2021 ?CLINICAL DATA:  Mental status change, unknown cause EXAM: CT HEAD WITHOUT CONTRAST TECHNIQUE: Contiguous axial images were obtained from the base of the skull through the vertex without intravenous contrast. RADIATION DOSE REDUCTION: This exam was performed according to the departmental dose-optimization program which includes automated exposure control, adjustment of the mA and/or kV according to patient size and/or use of iterative reconstruction technique. COMPARISON:  None. BRAIN: BRAIN Stable prominence of the lateral ventricles may be related to central predominant atrophy, although a component of normal pressure/communicating hydrocephalus cannot be excluded. Patchy and confluent areas of decreased attenuation are noted throughout the deep and periventricular white matter of the cerebral hemispheres bilaterally, compatible with chronic microvascular ischemic disease. No evidence of large-territorial acute infarction. No parenchymal hemorrhage. No mass lesion. No extra-axial collection. No mass effect or midline shift. No hydrocephalus. Basilar cisterns are patent. Vascular: No hyperdense vessel. Skull: No acute fracture or focal lesion. Sinuses/Orbits: Paranasal sinuses and mastoid air cells are clear. Bilateral lens replacement. Otherwise the orbits are unremarkable. Other: None. IMPRESSION: 1. No acute intracranial abnormality. 2. Stable  prominence of the lateral ventricles may be related to central predominant atrophy, although a component of normal pressure/communicating hydrocephalus cannot be excluded. Electronically Signed   By: Blanchie Serve

## 2021-12-04 DIAGNOSIS — R531 Weakness: Secondary | ICD-10-CM | POA: Diagnosis not present

## 2021-12-04 MED ORDER — ENSURE ENLIVE PO LIQD
237.0000 mL | Freq: Two times a day (BID) | ORAL | Status: DC
  Administered 2021-12-04 – 2021-12-07 (×4): 237 mL via ORAL

## 2021-12-04 MED ORDER — HALOPERIDOL 1 MG PO TABS
1.0000 mg | ORAL_TABLET | Freq: Four times a day (QID) | ORAL | Status: DC | PRN
  Filled 2021-12-04: qty 1

## 2021-12-04 MED ORDER — HALOPERIDOL LACTATE 5 MG/ML IJ SOLN
1.0000 mg | Freq: Four times a day (QID) | INTRAMUSCULAR | Status: DC | PRN
Start: 2021-12-04 — End: 2021-12-05
  Administered 2021-12-04 – 2021-12-05 (×2): 1 mg via INTRAMUSCULAR
  Filled 2021-12-04 (×2): qty 1

## 2021-12-04 MED ORDER — MELATONIN 3 MG PO TABS
3.0000 mg | ORAL_TABLET | Freq: Every day | ORAL | Status: DC
Start: 2021-12-04 — End: 2021-12-07
  Administered 2021-12-04 – 2021-12-06 (×3): 3 mg via ORAL
  Filled 2021-12-04 (×3): qty 1

## 2021-12-04 MED ORDER — ADULT MULTIVITAMIN W/MINERALS CH
1.0000 | ORAL_TABLET | Freq: Every day | ORAL | Status: DC
  Administered 2021-12-05 – 2021-12-07 (×3): 1 via ORAL
  Filled 2021-12-04 (×3): qty 1

## 2021-12-04 MED ORDER — HALOPERIDOL LACTATE 5 MG/ML IJ SOLN
1.0000 mg | Freq: Once | INTRAMUSCULAR | Status: AC
  Administered 2021-12-04: 1 mg via INTRAMUSCULAR
  Filled 2021-12-04: qty 1

## 2021-12-04 NOTE — Evaluation (Signed)
Occupational Therapy Evaluation ?Patient Details ?Name: Donna Simmons ?MRN: [id number] ?DOB: 02/18/1941 ?Today's Date: 12/04/2021 ? ? ?History of Present Illness Donna Simmons is a 81 year old female with past medical history significant for advanced dementia, paroxysmal atrial fibrillation on Eliquis, hypertension, hyperlipidemia, GERD who presented to [facility] ED for evaluation of altered mental status.  ? ?Clinical Impression ?  ?Donna Simmons is a 81 year old woman who presents with altered mental status compared to her baseline with increased difficulty perform functional mobility and Adls. On evaluation patient needing heavy verbal and tactile cues with ADLs and needing mod-total assist. Patient hand hold assist to ambulate in room. Patient will benefit from skilled OT services while in hospital to improve deficits and participate in functional activities as needed in order to return to PLOF.  Unsure of patient's detailed PLOF due to no family present. Patient needs 24/7 assistance at home at discharge. Current recommendation is HH as therapist expects some improvement in functional abilities. If family unable to provide current level assist, recommend short term rehab.  ? ?Recommendations for follow up therapy are one component of a multi-disciplinary discharge planning process, led by the attending physician.  Recommendations may be updated based on patient status, additional functional criteria and insurance authorization.  ? ?Follow Up Recommendations ? Home health OT  ?  ?Assistance Recommended at Discharge Frequent or constant Supervision/Assistance  ?Patient can return home with the following Assistance with cooking/housework;Direct supervision/assist for financial management;Assist for transportation;Help with stairs or ramp for entrance;Direct supervision/assist for medications management;A little help with walking and/or transfers;A lot of help with bathing/dressing/bathroom ? ?  ?Functional Status  Assessment ? Patient has had a recent decline in their functional status and demonstrates the ability to make significant improvements in function in a reasonable and predictable amount of time.  ?Equipment Recommendations ? None recommended by OT  ?  ?Recommendations for Other Services   ? ? ?  ?Precautions / Restrictions Precautions ?Precautions: Fall ?Precaution Comments: incontinent ?Restrictions ?Weight Bearing Restrictions: No  ? ?  ? ?Mobility Bed Mobility ?Overal bed mobility: Needs Assistance ?Bed Mobility: Supine to Sit ?  ?  ?Supine to sit: Min assist, HOB elevated ?  ?  ?  ?  ? ?Transfers ?Overall transfer level: Needs assistance ?  ?Transfers: Sit to/from Stand, Bed to chair/wheelchair/BSC ?Sit to Stand: Min assist ?  ?  ?  ?  ?  ?General transfer comment: Hand hold to ambulate to bathroom and back to recliner. Patient using hand on furniture as well. ?  ? ?  ?Balance Overall balance assessment: Mild deficits observed, not formally tested ?  ?  ?  ?  ?  ?  ?  ?  ?  ?  ?  ?  ?  ?  ?  ?  ?  ?  ?   ? ?ADL either performed or assessed with clinical judgement  ? ?ADL Overall ADL's : Needs assistance/impaired ?Eating/Feeding: Set up;Sitting ?  ?Grooming: Sitting;Cueing for sequencing;Wash/dry hands;Set up ?  ?Upper Body Bathing: Minimal assistance;Sitting;Cueing for sequencing ?  ?Lower Body Bathing: Sit to/from stand;Sitting/lateral leans;Maximal assistance;Cueing for sequencing ?  ?Upper Body Dressing : Moderate assistance;Sitting;Cueing for sequencing ?  ?Lower Body Dressing: Total assistance;Sit to/from stand;Cueing for sequencing;Cueing for safety ?  ?Toilet Transfer: Minimal assistance;Cueing for sequencing ?Toilet Transfer Details (indicate cue type and reason): Min assist f or hand hold to perform physical aspect of transfer but patient required heavy verbal and tactile cues to sit down on  BSC - attempted toilet but she would not sit down because she was cold ?Toileting- Clothing Manipulation and  Hygiene: Maximal assistance;Sit to/from stand;Cueing for sequencing ?Toileting - Clothing Manipulation Details (indicate cue type and reason): max assist for clothing management and wiping legs after incontinent - patient able to wipe herself with cues. Initially using toilet papter to wipe her thigh (twice) ?  ?  ?Functional mobility during ADLs: Minimal assistance ?   ? ? ? ?Vision   ?Vision Assessment?: No apparent visual deficits  ?   ?Perception   ?  ?Praxis   ?  ? ?Pertinent Vitals/Pain Pain Assessment ?Pain Assessment: No/denies pain ?Faces Pain Scale: No hurt  ? ? ? ?Hand Dominance Right ?  ?Extremity/Trunk Assessment Upper Extremity Assessment ?Upper Extremity Assessment: Overall WFL for tasks assessed ?  ?Lower Extremity Assessment ?Lower Extremity Assessment: Defer to PT evaluation ?  ?Cervical / Trunk Assessment ?Cervical / Trunk Assessment: Kyphotic ?  ?Communication Communication ?Communication: No difficulties ?  ?Cognition Arousal/Alertness: Awake/alert ?Behavior During Therapy: [facility] for tasks assessed/performed ?Overall Cognitive Status: History of cognitive impairments - at baseline ?  ?  ?  ?  ?  ?  ?  ?  ?  ?  ?  ?  ?  ?  ?  ?  ?General Comments: Patient able to state her name but durnig OT evaluation unable to state her husband's name, thinks she is at her Mother's house. Pt able to follow half of commands commands (mostly mobilityand hand placement) and required heavy verbal and tactile cues to sit on toilet and perform toileting task. ?  ?  ?General Comments    ? ?  ?Exercises   ?  ?Shoulder Instructions    ? ? ?Home Living Family/patient expects to be discharged to:: Private residence ?Living Arrangements: Spouse/significant other ?Available Help at Discharge: Family ?  ?  ?  ?  ?  ?  ?  ?  ?  ?  ?  ?  ?  ?  ?  ?  ? ?  ?Prior Functioning/Environment Prior Level of Function : Needs assist ?  ?  ?  ?  ?  ?  ?Mobility Comments: no family in room at this time. all infomation above was taken from  chart. patient is unable to provide PLOF at this time. ?ADLs Comments: unsure - but chart says she is able to ambulate to bathroom, dress herself and feed herself. ?  ? ?  ?  ?OT Problem List: Decreased activity tolerance;Impaired balance (sitting and/or standing);Decreased safety awareness;Decreased knowledge of precautions;Decreased knowledge of use of DME or AE;Decreased cognition ?  ?   ?OT Treatment/Interventions: Self-care/ADL training;Therapeutic exercise;Neuromuscular education;Therapeutic activities;Patient/family education;Balance training;DME and/or AE instruction  ?  ?OT Goals(Current goals can be found in the care plan section) Acute Rehab OT Goals ?OT Goal Formulation: Patient unable to participate in goal setting ?Time For Goal Achievement: 12/18/21 ?Potential to Achieve Goals: Fair  ?OT Frequency: Min 2X/week ?  ? ?Co-evaluation   ?  ?  ?  ?  ? ?  ?AM-PAC OT "6 Clicks" Daily Activity     ?Outcome Measure Help from another person eating meals?: A Little ?Help from another person taking care of personal grooming?: A Little ?Help from another person toileting, which includes using toliet, bedpan, or urinal?: A Lot ?Help from another person bathing (including washing, rinsing, drying)?: A Lot ?Help from another person to put on and taking off regular upper body clothing?: A Lot ?Help from another  person to put on and taking off regular lower body clothing?: Total ?6 Click Score: 13 ?  ?End of Session Nurse Communication: Mobility status ? ?Activity Tolerance: Patient tolerated treatment well ?Patient left: in chair;with call bell/phone within reach;with chair alarm set ? ?OT Visit Diagnosis: Unsteadiness on feet (R26.81);Muscle weakness (generalized) (M62.81)  ?              ?Time: [id number] ?OT Time Calculation (min): 17 min ?Charges:  OT General Charges ?$OT Visit: 1 Visit ?OT Evaluation ?$OT Eval Low Complexity: 1 Low ? ?Ara Mano, OTR/L ?Acute Care Rehab Services  ?Office [phone] ?Pager:  [phone]  ? ?Frieda Arnall L Deshonda Cryderman ?12/04/2021, 10:53 AM ?

## 2021-12-04 NOTE — Progress Notes (Signed)
Initial Nutrition Assessment ? ?INTERVENTION:  ? ?-Ensure Plus High Protein po BID, each supplement provides 350 kcal and 20 grams of protein.  ? ?-Multivitamin with minerals daily ? ?NUTRITION DIAGNOSIS:  ? ?Inadequate oral intake related to chronic illness (advanced dementia) as evidenced by per patient/family report. ? ?GOAL:  ? ?Patient will meet greater than or equal to 90% of their needs ? ?MONITOR:  ? ?PO intake, Supplement acceptance, I & O's, Labs, Weight trends ? ?REASON FOR ASSESSMENT:  ? ?Consult, Malnutrition Screening Tool ?Assessment of nutrition requirement/status ? ?ASSESSMENT:  ? ?81 year old female with past medical history significant for advanced dementia, paroxysmal atrial fibrillation on Eliquis, hypertension, hyperlipidemia, GERD who presented to [facility] ED for evaluation of altered mental status. ? ?Patient currently is agitated and uncooperative. At time of visit, pt was trying to get out of bed and staff was having to assist her back in bed. Pt unable to provide history at this time, only alert/oriented x 1. ? ?Per chart review, husband reported pt at baseline is able to feed herself. She has had poor PO for at least 3 days PTA. Has had increased weakness.  ? ?Will order Ensure supplements for additional kcals and protein. ? ?No weight available for this admission. Per care everywhere, pt weighed 123 lbs on 10/02/21. ? ?Medications reviewed. ? ?Labs reviewed: ? CBGs: 77 ? ?NUTRITION - FOCUSED PHYSICAL EXAM: ? ?Unable to complete at this time. ? ?Diet Order:   ?Diet Order   ? ?       ?  Diet Heart Room service appropriate? Yes; Fluid consistency: Thin  Diet effective now       ?  ? ?  ?  ? ?  ? ? ?EDUCATION NEEDS:  ? ?Not appropriate for education at this time ? ?Skin:  Skin Assessment: Reviewed RN Assessment ? ?Last BM:  PTA ? ?Height:  ? ?Ht Readings from Last 1 Encounters:  ?10/15/21 5' (1.524 m)  ? ? ?Weight:  ? ?Wt Readings from Last 1 Encounters:  ?10/15/21 54.1 kg  ? ? ?BMI:  There is no  height or weight on file to calculate BMI. ? ?Estimated Nutritional Needs:  ? ?Kcal:  1250-1450 ? ?Protein:  60-75g ? ?Fluid:  1.5L/day ? ?Clayton Bibles, MS, RD, LDN ?Inpatient Clinical Dietitian ?Contact information available via Amion ? ?

## 2021-12-04 NOTE — Progress Notes (Signed)
?PROGRESS NOTE ? ? ? ?[name]  [id number] DOB: Dec 24, 1940 DOA: 12/03/2021 ?PCP: [name], DO  ? ? ?Brief Narrative:  ?Donna Simmons is a 81 year old female with past medical history significant for advanced dementia, paroxysmal atrial fibrillation on Eliquis, hypertension, hyperlipidemia, GERD who presented to [facility] ED for evaluation of altered mental status.  History was provided by husband via telephone, he reported at baseline patient able to feed herself, utilize the restroom by herself and changing her close on her own but over the last 3 days she has not been performing these ADLs and with poor oral intake.  Patient's husband also states has been unable to assist his wife due to his own medical conditions.  Patient complains of generalized weakness but denies any specific focal findings.  Patient denies pain, no urinary symptoms or no abdominal pain; although in the setting of advanced dementia. ? ?In the ED, temperature 97.9 ?F, HR 56, RR 14, BP 140/72, SPO2 98% on room air.  Sodium 142, potassium 3.7, chloride 110, CO2 28, glucose 94, BUN 15, creatinine 0.94.  AST 22, ALT 20, total bilirubin 0.2.  Ammonia 21.  WBC 3.3, hemoglobin 11.1, platelets 175.  Urinalysis with large leukocytes, negative nitrite, rare bacteria, 6-10 WBCs.  Head CT without contrast with no acute intracranial abnormality; stable prominence of lateral ventricles related to central predominant atrophy.  Chest x-ray with no active cardiopulmonary disease process.  TRH was consulted for further evaluation management of altered mental status likely secondary to progressive and of advanced dementia. ? ? ? ?Assessment & Plan: ? ?Advanced dementia ?Patient presenting to ED with reported worsening confusion in the setting of advanced dementia and per family report unable to now accomplish ADLs.  Patient previously able to dress, bathe, feed herself at baseline.  Patient is afebrile without leukocytosis.  CT head without acute  findings.  Chest x-ray negative.  Urinalysis unrevealing.  Etiology of her worsening confusion, likely secondary to progression of her advanced dementia. ?--PT/OT evaluation ?--Delirium precautions ?--Get up during the day ?--Encourage a familiar face to remain present throughout the day ?--Keep blinds open and lights on during daylight hours ?--Minimize the use of opioids/benzodiazepines ?--Supportive care ?--Fall precautions ? ?Urinary incontinence ?Husband reported patient with urinary incontinence.  Urinalysis unrevealing.  Patient is afebrile without leukocytosis. ?--Urine culture: Pending ?--Hold antibiotics at this time ? ?Essential hypertension ?--BP 150/71 this morning ?--Lisinopril 20 mg p.o. daily ? ?Paroxysmal atrial fibrillation ?--Metoprolol succinate 50 mg p.o. daily ?--Eliquis 5 mg p.o. twice daily ? ?Hyperlipidemia ?--Crestor 20 mg p.o. daily ? ?GERD ?--Protonix 40 mg p.o. daily ? ?Generalized weakness ?--PT/OT evaluation ? ? ? ? ?DVT prophylaxis:  ?apixaban (ELIQUIS) tablet 5 mg  ?  Code Status: Full Code ?Family Communication: Updated patient's spouse, Thurmond via telephone this morning ? ?Disposition Plan:  ?Level of care: Med-Surg ?Status is: Observation ?The patient remains OBS appropriate and will d/c before 2 midnights. ?  ? ?Consultants:  ?None ? ?Procedures:  ?None ? ?Antimicrobials:  ?None ? ? ?Subjective: ?Patient seen examined bedside, resting comfortably.  Working with OT.  Pleasantly confused.  Reports her husband's name is Thurmond.  When asked if she is hungry, patient states "I am getting there".  No other specific complaints or concerns at this time.  Denies shortness of breath, no chest pain, no abdominal pain.  No acute events overnight per nurse staff. ? ?Objective: ?Vitals:  ? 12/03/21 2000 12/03/21 2030 12/04/21 0022 12/04/21 0411  ?BP: (!) 156/77 Marland Kitchen)  177/123 (!) 157/80 (!) 150/71  ?Pulse: (!) 55 (!) 54 (!) 53 (!) 58  ?Resp: 18 18 12 14   ?Temp:   97.7 ?F (36.5 ?C) 97.7 ?F  (36.5 ?C)  ?TempSrc:      ?SpO2:  96% 100% 100%  ? ? ?Intake/Output Summary (Last 24 hours) at 12/04/2021 0919 ?Last data filed at 12/04/2021 0600 ?Gross per 24 hour  ?Intake 180 ml  ?Output 850 ml  ?Net -670 ml  ? ?There were no vitals filed for this visit. ? ?Examination: ? ?Physical Exam: ?GEN: NAD, alert, pleasantly confused, not oriented to person/place/time/situation; but does recall husband's name is Thurmond; elderly/thin in appearance ?HEENT: NCAT, PERRL, EOMI, sclera clear, MMM ?PULM: CTAB w/o wheezes/crackles, normal respiratory effort, on room air ?CV: RRR w/o M/G/R ?GI: abd soft, NTND, NABS, no R/G/M ?MSK: no peripheral edema, muscle strength globally intact 5/5 bilateral upper/lower extremities ?NEURO: CN II-XII intact, no focal deficits, sensation to light touch intact ?PSYCH: Depressed mood, flat affect ?Integumentary: dry/intact, no rashes or wounds ? ? ? ?Data Reviewed: I have personally reviewed following labs and imaging studies ? ?CBC: ?Recent Labs  ?Lab 12/03/21 ?1840  ?WBC 3.3*  ?NEUTROABS 2.1  ?HGB 11.1*  ?HCT 36.4  ?MCV 84.1  ?PLT 175  ? ?Basic Metabolic Panel: ?Recent Labs  ?Lab 12/03/21 ?1840  ?NA 142  ?K 3.7  ?CL 110  ?CO2 28  ?GLUCOSE 94  ?BUN 15  ?CREATININE 0.94  ?CALCIUM 8.6*  ? ?GFR: ?CrCl cannot be calculated (Unknown ideal weight.). ?Liver Function Tests: ?Recent Labs  ?Lab 12/03/21 ?1840  ?AST 22  ?ALT 20  ?ALKPHOS 54  ?BILITOT 0.2*  ?PROT 6.6  ?ALBUMIN 3.4*  ? ?No results for input(s): LIPASE, AMYLASE in the last 168 hours. ?Recent Labs  ?Lab 12/03/21 ?1840  ?AMMONIA 21  ? ?Coagulation Profile: ?No results for input(s): INR, PROTIME in the last 168 hours. ?Cardiac Enzymes: ?No results for input(s): CKTOTAL, CKMB, CKMBINDEX, TROPONINI in the last 168 hours. ?BNP (last 3 results) ?No results for input(s): PROBNP in the last 8760 hours. ?HbA1C: ?No results for input(s): HGBA1C in the last 72 hours. ?CBG: ?Recent Labs  ?Lab 12/03/21 ?[id number]  ?GLUCAP 77  ? ?Lipid Profile: ?No results for  input(s): CHOL, HDL, LDLCALC, TRIG, CHOLHDL, LDLDIRECT in the last 72 hours. ?Thyroid Function Tests: ?No results for input(s): TSH, T4TOTAL, FREET4, T3FREE, THYROIDAB in the last 72 hours. ?Anemia Panel: ?No results for input(s): VITAMINB12, FOLATE, FERRITIN, TIBC, IRON, RETICCTPCT in the last 72 hours. ?Sepsis Labs: ?No results for input(s): PROCALCITON, LATICACIDVEN in the last 168 hours. ? ?Recent Results (from the past 240 hour(s))  ?Resp Panel by RT-PCR (Flu A&B, Covid) Nasopharyngeal Swab     Status: None  ? Collection Time: 12/03/21  6:40 PM  ? Specimen: Nasopharyngeal Swab; Nasopharyngeal(NP) swabs in vial transport medium  ?Result Value Ref Range Status  ? SARS Coronavirus 2 by RT PCR NEGATIVE NEGATIVE Final  ?  Comment: (NOTE) ?SARS-CoV-2 target nucleic acids are NOT DETECTED. ? ?The SARS-CoV-2 RNA is generally detectable in upper respiratory ?specimens during the acute phase of infection. The lowest ?concentration of SARS-CoV-2 viral copies this assay can detect is ?138 copies/mL. A negative result does not preclude SARS-Cov-2 ?infection and should not be used as the sole basis for treatment or ?other patient management decisions. A negative result may occur with  ?improper specimen collection/handling, submission of specimen other ?than nasopharyngeal swab, presence of viral mutation(s) within the ?areas targeted by this assay, and inadequate number  of viral ?copies(<138 copies/mL). A negative result must be combined with ?clinical observations, patient history, and epidemiological ?information. The expected result is Negative. ? ?Fact Sheet for Patients:  ?EntrepreneurPulse.com.au ? ?Fact Sheet for Healthcare Providers:  ?IncredibleEmployment.be ? ?This test is no t yet approved or cleared by the [place] FDA and  ?has been authorized for detection and/or diagnosis of SARS-CoV-2 by ?FDA under an Emergency Use Authorization (EUA). This EUA will remain  ?in effect  (meaning this test can be used) for the duration of the ?COVID-19 declaration under Section 564(b)(1) of the Act, 21 ?U.S.C.section 360bbb-3(b)(1), unless the authorization is terminated  ?or revoked sooner.  ?

## 2021-12-04 NOTE — Progress Notes (Signed)
PT Cancellation Note ? ?Patient Details ?Name: Donna Simmons ?MRN: [id number] ?DOB: 02-03-1941 ? ? ?Cancelled Treatment:    Reason Eval/Treat Not Completed:  Attempted PT eval-nursing in room with pt-reports pt is currently agitated-requested PT hold for now. Will check back on tomorrow. ? ? ? ?Faye Ramsay, PT ?Acute Rehabilitation  ?Office: [phone] ?Pager: [phone] ? ?  ?

## 2021-12-05 DIAGNOSIS — R531 Weakness: Secondary | ICD-10-CM | POA: Diagnosis not present

## 2021-12-05 DIAGNOSIS — Z7189 Other specified counseling: Secondary | ICD-10-CM | POA: Diagnosis not present

## 2021-12-05 DIAGNOSIS — F03B11 Unspecified dementia, moderate, with agitation: Secondary | ICD-10-CM

## 2021-12-05 LAB — URINE CULTURE

## 2021-12-05 MED ORDER — HALOPERIDOL 0.5 MG PO TABS
0.2500 mg | ORAL_TABLET | Freq: Four times a day (QID) | ORAL | Status: DC | PRN
  Filled 2021-12-05: qty 0.5

## 2021-12-05 MED ORDER — HALOPERIDOL 0.5 MG PO TABS
0.2500 mg | ORAL_TABLET | Freq: Two times a day (BID) | ORAL | Status: DC
  Administered 2021-12-05 – 2021-12-06 (×2): 0.25 mg via ORAL
  Filled 2021-12-05 (×3): qty 0.5

## 2021-12-05 MED ORDER — HALOPERIDOL LACTATE 5 MG/ML IJ SOLN
0.2500 mg | Freq: Four times a day (QID) | INTRAMUSCULAR | Status: DC | PRN
Start: 2021-12-05 — End: 2021-12-07

## 2021-12-05 MED ORDER — HALOPERIDOL 0.5 MG PO TABS
0.5000 mg | ORAL_TABLET | Freq: Two times a day (BID) | ORAL | Status: DC
  Filled 2021-12-05 (×2): qty 1

## 2021-12-05 NOTE — Consult Note (Signed)
? ?                                                                                ?Consultation Note ?Date: 12/05/2021  ? ?Patient Name: Donna Simmons  ?DOB: 1940-10-28  MRN: [id number]  Age / Sex: 81 y.o., female  ?PCP: Yolanda Manges, DO ?Referring Physician: [place], Eric J, DO ? ?Reason for Consultation: goals of care ? ?HPI/Patient Profile: 81 y.o. female  with past medical history of dementia, a-fib, HTN admitted on 12/03/2021 with poor oral intake, worsening lethargy, altered mental status. Workup for any acute reversible illness has been negative so far. She has had some agitation and received haldol and is in restraints. Albumin is slightly low, but not significantly decreased at 3.4. CT scan with no acute intracranial abnomality. ? ?Primary Decision Maker ?NEXT OF KIN- spouse ? ?Discussion: ?I have reviewed medical records including EPIC notes, labs and imaging, assessed the patient and then spoke with her spouse via phone to discuss diagnosis prognosis, GOC, EOL wishes. ? ?On evaluation patient was lethargic, she would not open her eyes and respond to me. Per nursing she was more awake this morning, she ate breakfast, however, becomes more sedated after haldol administration.  ? ?I introduced Palliative Medicine as specialized medical care for people living with serious illness. It focuses on providing relief from the symptoms and stress of a serious illness. The goal is to improve quality of life for both the patient and the family. ? ?We discussed a brief life review of the patient. She and spouse have been married for 50 years. 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  ? ?As far as functional and nutritional status- prior to this admission and her acute onset of lethargy she was able to feed herself, dress herself, toilet herself. She then stopped eating and refused to take her medications and became  progressively weaker prompting her spouse to bring her to the ED.   ? ? We discussed patient's current illness and what it means in the larger context of patient's on-going co-morbidities.  Natural disease trajectory and expectations at EOL were discussed. We discussed the overall trajectory of dementia  ? ?I attempted to elicit values and goals of care important to the patient. Thurmond notes he wishes to continue to prolong Ronnett's life, but also recognizes the need to limit her suffering.  ? ?The difference between aggressive medical intervention and comfort care was considered in light of the patient's goals of care.  ? ?Advance directives, concepts specific to code status, artificial feeding and hydration, and rehospitalization were considered and discussed. Anysha does not have Advance Directives or a Living will. Thurmond states they had never discussed these issues. He does know that if she were to be on artificial life support he would not want her prolonged if she had irreversible disease.  ? ?Encouraged patient/family to consider DNR/DNI status understanding evidenced based poor outcomes in similar hospitalized patients, as the cause of the arrest is likely associated with chronic/terminal disease rather than a reversible acute cardio-pulmonary event.  Thurmond wishes to keep  patient full code for now.  ? ?Discussed with patient/family the importance of continued conversation with family and the medical providers regarding overall plan of care and treatment options, ensuring decisions are within the context of the patient?s values and GOCs.   ? ?Hospice and Palliative Care services outpatient were explained and offered. ? ?Questions and concerns were addressed. The family was encouraged to call with questions or concerns.  ? ?  ?SUMMARY OF RECOMMENDATIONS ?-Full scope/full code ?-GOC are life prolonging, spouse is hopeful for patient to discharge to SNF ?-PMT will continue to follow ?   ? ?Code  Status/Advance Care Planning: ?Full code ? ? ?Prognosis:   ?Unable to determine ? ?Discharge Planning: To Be Determined ? ?Primary Diagnoses: ?Present on Admission: ?**None** ? ? ?Review of Systems  ?Unable to perform ROS: Dementia  ? ?Physical Exam ?Vitals and nursing note reviewed.  ?Constitutional:   ?   Comments: frail  ?Pulmonary:  ?   Effort: Pulmonary effort is normal.  ?Neurological:  ?   Comments: lethargic  ? ? ?Vital Signs: BP 94/67 (BP Location: Left Arm)   Pulse (!) 55   Temp (!) 97.5 ?F (36.4 ?C) (Oral)   Resp 14   Ht 5' (1.524 m)   Wt 54.1 kg   SpO2 100%   BMI 23.29 kg/m?  ?Pain Scale: 0-10 ?  ?Pain Score: 0-No pain ? ? ?SpO2: SpO2: 100 % ?O2 Device:SpO2: 100 % ?O2 Flow Rate: .  ? ?IO: Intake/output summary:  ?Intake/Output Summary (Last 24 hours) at 12/05/2021 1648 ?Last data filed at 12/05/2021 1400 ?Gross per 24 hour  ?Intake 300 ml  ?Output 750 ml  ?Net -450 ml  ? ? ?LBM: Last BM Date :  (UTA) ?Baseline Weight: Weight: 54.1 kg ?Most recent weight: Weight: 54.1 kg     ? ? ? ?Thank you for this consult. Palliative medicine will continue to follow and assist as needed.  ? ?Signed by: ?Ocie Bob, AGNP-C ?Palliative Medicine ? ?  ?Please contact Palliative Medicine Team phone at [phone] for questions and concerns.  ?For individual provider: See Amion ? ? ? ? ? ? ? ? ? ? ? ? ? ? ?

## 2021-12-05 NOTE — Progress Notes (Addendum)
?PROGRESS NOTE ? ? ? ?Donna Simmons  [id number] DOB: 03/23/1941 DOA: 12/03/2021 ?PCP: [name], DO  ? ? ?Brief Narrative:  ?Donna Simmons is a 81 year old female with past medical history significant for advanced dementia, paroxysmal atrial fibrillation on Eliquis, hypertension, hyperlipidemia, GERD who presented to [facility] ED for evaluation of altered mental status.  History was provided by husband via telephone, he reported at baseline patient able to feed herself, utilize the restroom by herself and changing her close on her own but over the last 3 days she has not been performing these ADLs and with poor oral intake.  Patient's husband also states has been unable to assist his wife due to his own medical conditions.  Patient complains of generalized weakness but denies any specific focal findings.  Patient denies pain, no urinary symptoms or no abdominal pain; although in the setting of advanced dementia. ? ?In the ED, temperature 97.9 ?F, HR 56, RR 14, BP 140/72, SPO2 98% on room air.  Sodium 142, potassium 3.7, chloride 110, CO2 28, glucose 94, BUN 15, creatinine 0.94.  AST 22, ALT 20, total bilirubin 0.2.  Ammonia 21.  WBC 3.3, hemoglobin 11.1, platelets 175.  Urinalysis with large leukocytes, negative nitrite, rare bacteria, 6-10 WBCs.  Head CT without contrast with no acute intracranial abnormality; stable prominence of lateral ventricles related to central predominant atrophy.  Chest x-ray with no active cardiopulmonary disease process.  TRH was consulted for further evaluation management of altered mental status likely secondary to progressive and of advanced dementia. ? ? ? ?Assessment & Plan: ? ?Advanced dementia ?Patient presenting to ED with reported worsening confusion in the setting of advanced dementia and per family report unable to now accomplish ADLs.  Patient previously able to dress, bathe, feed herself at baseline.  Patient is afebrile without leukocytosis.  CT head without acute  findings.  Chest x-ray negative.  Urinalysis unrevealing.  Etiology of her worsening confusion, likely secondary to progression of her advanced dementia. ?--OT recommends home health ?--PT evaluation: Pending ?--Delirium precautions ?--Get up during the day ?--Encourage a familiar face to remain present throughout the day ?--Keep blinds open and lights on during daylight hours ?--Minimize the use of opioids/benzodiazepines ?--Supportive care ?--Fall precautions ?--Melatonin 3 mg p.o. nightly ?--Haldol 0.5 mg PO BID and as needed for agitation ?--Palliative care consult: Pending ? ?Urinary incontinence ?Husband reported patient with urinary incontinence.  Urinalysis unrevealing.  Patient is afebrile without leukocytosis. ?--Urine culture: Pending ?--Hold antibiotics at this time ? ?Essential hypertension ?--BP 124/69 this morning ?--Metoprolol succinate 50 mg p.o. daily ?--Lisinopril 20 mg p.o. daily ? ?Paroxysmal atrial fibrillation ?--Metoprolol succinate 50 mg p.o. daily ?-Flecainide 50mg  PO BID ?--Eliquis 5 mg p.o. twice daily ? ?Hyperlipidemia ?--Crestor 20 mg p.o. daily ? ?GERD ?--Protonix 40 mg p.o. daily ? ?Generalized weakness ?--OT recommends home health ?--PT evaluation: Pending ? ? ? ?DVT prophylaxis:  ?apixaban (ELIQUIS) tablet 5 mg  ?  Code Status: Full Code ?Family Communication: Updated patient's spouse, Thurmond via telephone this morning and would like to explore avenues for placement. ? ?Disposition Plan:  ?Level of care: Med-Surg ?Status is: Observation ?The patient remains OBS appropriate and will d/c before 2 midnights. ?  ? ?Consultants:  ?Palliative care: Pending ? ?Procedures:  ?None ? ?Antimicrobials:  ?None ? ? ?Subjective: ?Patient seen examined bedside, resting comfortably.  Sleeping but easily arousable.  Pleasantly confused.  Lapbelt in place with hand mitts.  No family present at bedside.  Denies shortness of breath,  no chest pain, no abdominal pain.  No acute events overnight per  nurse staff. ? ?Given patient's advanced dementia, unable to perform ADLs and poor support at home with husband with his own acute on chronic medical conditions and no other family members for support, unsafe discharge home at this time. Awaiting palliative care consult.   ? ?Objective: ?Vitals:  ? 12/04/21 2153 12/04/21 2210 12/04/21 2216 12/05/21 0527  ?BP: 117/67   124/69  ?Pulse: (!) 108 (!) 107 (!) 107 60  ?Resp: 16   16  ?Temp: 97.7 ?F (36.5 ?C)   (!) 97.5 ?F (36.4 ?C)  ?TempSrc: Oral   Oral  ?SpO2: 99%   97%  ?Weight:      ?Height:      ? ? ?Intake/Output Summary (Last 24 hours) at 12/05/2021 0821 ?Last data filed at 12/05/2021 0600 ?Gross per 24 hour  ?Intake 540 ml  ?Output 1000 ml  ?Net -460 ml  ? ?Filed Weights  ? 12/04/21 1500  ?Weight: 54.1 kg  ? ? ?Examination: ? ?Physical Exam: ?GEN: NAD, alert, pleasantly confused, not oriented to person/place/time/situation; but does recall husband's name is Thurmond; elderly/thin in appearance, lap belt and soft mitts in place ?HEENT: NCAT, PERRL, EOMI, sclera clear, MMM ?PULM: CTAB w/o wheezes/crackles, normal respiratory effort, on room air ?CV: RRR w/o M/G/R ?GI: abd soft, NTND, NABS, no R/G/M ?MSK: no peripheral edema, muscle strength globally intact 5/5 bilateral upper/lower extremities ?NEURO: CN II-XII intact, no focal deficits, sensation to light touch intact ?PSYCH: Depressed mood, flat affect ?Integumentary: dry/intact, no rashes or wounds ? ? ? ?Data Reviewed: I have personally reviewed following labs and imaging studies ? ?CBC: ?Recent Labs  ?Lab 12/03/21 ?1840  ?WBC 3.3*  ?NEUTROABS 2.1  ?HGB 11.1*  ?HCT 36.4  ?MCV 84.1  ?PLT 175  ? ?Basic Metabolic Panel: ?Recent Labs  ?Lab 12/03/21 ?1840  ?NA 142  ?K 3.7  ?CL 110  ?CO2 28  ?GLUCOSE 94  ?BUN 15  ?CREATININE 0.94  ?CALCIUM 8.6*  ? ?GFR: ?Estimated Creatinine Clearance: 35.4 mL/min (by C-G formula based on SCr of 0.94 mg/dL). ?Liver Function Tests: ?Recent Labs  ?Lab 12/03/21 ?1840  ?AST 22  ?ALT 20   ?ALKPHOS 54  ?BILITOT 0.2*  ?PROT 6.6  ?ALBUMIN 3.4*  ? ?No results for input(s): LIPASE, AMYLASE in the last 168 hours. ?Recent Labs  ?Lab 12/03/21 ?1840  ?AMMONIA 21  ? ?Coagulation Profile: ?No results for input(s): INR, PROTIME in the last 168 hours. ?Cardiac Enzymes: ?No results for input(s): CKTOTAL, CKMB, CKMBINDEX, TROPONINI in the last 168 hours. ?BNP (last 3 results) ?No results for input(s): PROBNP in the last 8760 hours. ?HbA1C: ?No results for input(s): HGBA1C in the last 72 hours. ?CBG: ?Recent Labs  ?Lab 12/03/21 ?[id number]  ?GLUCAP 77  ? ?Lipid Profile: ?No results for input(s): CHOL, HDL, LDLCALC, TRIG, CHOLHDL, LDLDIRECT in the last 72 hours. ?Thyroid Function Tests: ?No results for input(s): TSH, T4TOTAL, FREET4, T3FREE, THYROIDAB in the last 72 hours. ?Anemia Panel: ?No results for input(s): VITAMINB12, FOLATE, FERRITIN, TIBC, IRON, RETICCTPCT in the last 72 hours. ?Sepsis Labs: ?No results for input(s): PROCALCITON, LATICACIDVEN in the last 168 hours. ? ?Recent Results (from the past 240 hour(s))  ?Resp Panel by RT-PCR (Flu A&B, Covid) Nasopharyngeal Swab     Status: None  ? Collection Time: 12/03/21  6:40 PM  ? Specimen: Nasopharyngeal Swab; Nasopharyngeal(NP) swabs in vial transport medium  ?Result Value Ref Range Status  ? SARS Coronavirus 2 by RT PCR  NEGATIVE NEGATIVE Final  ?  Comment: (NOTE) ?SARS-CoV-2 target nucleic acids are NOT DETECTED. ? ?The SARS-CoV-2 RNA is generally detectable in upper respiratory ?specimens during the acute phase of infection. The lowest ?concentration of SARS-CoV-2 viral copies this assay can detect is ?138 copies/mL. A negative result does not preclude SARS-Cov-2 ?infection and should not be used as the sole basis for treatment or ?other patient management decisions. A negative result may occur with  ?improper specimen collection/handling, submission of specimen other ?than nasopharyngeal swab, presence of viral mutation(s) within the ?areas targeted by this  assay, and inadequate number of viral ?copies(<138 copies/mL). A negative result must be combined with ?clinical observations, patient history, and epidemiological ?information. The expected result is Negative. ? ?Fact Sh

## 2021-12-05 NOTE — Evaluation (Signed)
Physical Therapy Evaluation ?Patient Details ?Name: Donna Simmons ?MRN: [id number] ?DOB: 05-Dec-1940 ?Today's Date: 12/05/2021 ? ?History of Present Illness ? Donna Simmons is a 81 year old female with past medical history significant for advanced dementia, paroxysmal atrial fibrillation on Eliquis, hypertension, hyperlipidemia, GERD who presented to ED for evaluation of altered mental status. ?  ?Clinical Impression ? Pt is a 81 y.o. female with above HPI resulting in the deficits listed below (see PT Problem List). Pt Min-MOD A for bed mobility, not receptive for cuing to perform transfers/ambulation. Pt becoming agitated and combative with therapist, further mobility attempts deferred to maintain pt and therapist safety. RN in at end of session to assist with reapplication of mitts. Will attempt trial of skilled PT services in order to maximize safety with functional mobility and increase independence in order to decrease caregiver burden.  ?   ? ?Recommendations for follow up therapy are one component of a multi-disciplinary discharge planning process, led by the attending physician.  Recommendations may be updated based on patient status, additional functional criteria and insurance authorization. ? ?Follow Up Recommendations Skilled nursing-short term rehab (<3 hours/day) ? ?  ?Assistance Recommended at Discharge Frequent or constant Supervision/Assistance  ?Patient can return home with the following ? A little help with bathing/dressing/bathroom;A little help with walking and/or transfers;Assistance with cooking/housework;Assistance with feeding;Direct supervision/assist for medications management;Direct supervision/assist for financial management;Assist for transportation;Help with stairs or ramp for entrance ? ?  ?Equipment Recommendations  (TBD based on mobility progress)  ?Recommendations for Other Services ?    ?  ?Functional Status Assessment Patient has had a recent decline in their functional status  and demonstrates the ability to make significant improvements in function in a reasonable and predictable amount of time.  ? ?  ?Precautions / Restrictions Precautions ?Precautions: Fall ?Precaution Comments: episodes of incontinence ?Restrictions ?Weight Bearing Restrictions: No  ? ?  ? ?Mobility ? Bed Mobility ?Overal bed mobility: Needs Assistance ?Bed Mobility: Supine to Sit ?  ?  ?Supine to sit: Min assist, HOB elevated ?Sit to supine: Mod assist, HOB elevated ?  ?General bed mobility comments: pt able to come to edge of bed with increased time and tactile cues for progression. Able to use UEs to scoot to EOB. Multiple attempts to get pt to perform sit to stand transfer/ambulation with verbal/tactile/visual cuing with HHA and placing RW in front of pt, but pt refusing and perseverating on fixing gown throughout session, Repeatedly stating "no, i'm not getting into no man's business" when prompted for transfers/OOB. Pt becoming agitated while sititng EOB, further mobility deferred to maintain pt/therapist safety.  MOD A to return to supine. Pt becoming combative when therapist attempting to reapply mitts, RN in to assist and restraints reapplied successfully. ?  ? ?Transfers ?  ?  ?  ?  ?  ?  ?  ?  ?  ?  ?  ? ?Ambulation/Gait ?  ?  ?  ?  ?  ?  ?  ?  ? ?Stairs ?  ?  ?  ?  ?  ? ?Wheelchair Mobility ?  ? ?Modified Rankin (Stroke Patients Only) ?  ? ?  ? ?Balance Overall balance assessment: Needs assistance ?Sitting-balance support: Feet supported ?Sitting balance-Leahy Scale: Fair ?  ?  ?  ?  ?  ?  ?  ?  ?  ?  ?  ?  ?  ?  ?  ?  ?   ? ? ? ?Pertinent Vitals/Pain Pain Assessment ?Pain  Assessment: PAINAD ?Breathing: normal ?Negative Vocalization: none ?Facial Expression: smiling or inexpressive ?Body Language: relaxed ?Consolability: no need to console ?PAINAD Score: 0  ? ? ?Home Living Family/patient expects to be discharged to:: Private residence ?Living Arrangements: Spouse/significant other ?Available Help at  Discharge: Family ?  ?  ?  ?  ?  ?  ?  ?   ?  ?Prior Function   ?  ?  ?  ?  ?  ?  ?Mobility Comments: no family in room at this time. all infomation above was taken from EMR. patient is unable to provide PLOF at this time due to cognitive deficits. ?ADLs Comments: unsure - but chart says she is able to ambulate to bathroom, dress herself and feed herself. ?  ? ? ?Hand Dominance  ? Dominant Hand: Right ? ?  ?Extremity/Trunk Assessment  ? Upper Extremity Assessment ?Upper Extremity Assessment: Overall WFL for tasks assessed ?  ? ?Lower Extremity Assessment ?Lower Extremity Assessment: Difficult to assess due to impaired cognition (at least 3/5, able to move against gravity with bed mobiltiy and while seated EOB.) ?  ? ?Cervical / Trunk Assessment ?Cervical / Trunk Assessment: Kyphotic  ?Communication  ? Communication: No difficulties  ?Cognition Arousal/Alertness: Awake/alert ?Behavior During Therapy: [facility] InstituteWFL for tasks assessed/performed ?Overall Cognitive Status: History of cognitive impairments - at baseline ?  ?  ?  ?  ?  ?  ?  ?  ?  ?  ?  ?  ?  ?  ?  ?  ?General Comments: Alert to self only, able to verbalize first name, stating "I don't know" when asked DOB. ?  ?  ? ?  ?General Comments   ? ?  ?Exercises    ? ?Assessment/Plan  ?  ?PT Assessment Patient needs continued PT services  ?PT Problem List Decreased mobility;Decreased activity tolerance;Decreased balance;Decreased cognition ? ?   ?  ?PT Treatment Interventions DME instruction;Gait training;Therapeutic activities;Therapeutic exercise;Patient/family education;Balance training;Functional mobility training;Stair training   ? ?PT Goals (Current goals can be found in the Care Plan section)  ?Acute Rehab PT Goals ?Patient Stated Goal: none stated ?PT Goal Formulation: Patient unable to participate in goal setting ?Time For Goal Achievement: 12/19/21 ?Potential to Achieve Goals: Fair ? ?  ?Frequency Min 2X/week ?  ? ? ?Co-evaluation   ?  ?  ?  ?  ? ? ?  ?AM-PAC PT "6  Clicks" Mobility  ?Outcome Measure Help needed turning from your back to your side while in a flat bed without using bedrails?: A Little ?Help needed moving from lying on your back to sitting on the side of a flat bed without using bedrails?: A Lot ?Help needed moving to and from a bed to a chair (including a wheelchair)?: A Lot ?Help needed standing up from a chair using your arms (e.g., wheelchair or bedside chair)?: A Lot ?Help needed to walk in hospital room?: A Lot ?Help needed climbing 3-5 steps with a railing? : A Lot ?6 Click Score: 13 ? ?  ?End of Session   ?Activity Tolerance: Treatment limited secondary to agitation ?Patient left: in bed;with call bell/phone within reach;with bed alarm set;with restraints reapplied ?Nurse Communication: Mobility status ?PT Visit Diagnosis: Muscle weakness (generalized) (M62.81) ?  ? ?Time: [id number] ?PT Time Calculation (min) (ACUTE ONLY): 21 min ? ? ?Charges:   PT Evaluation ?$PT Eval Low Complexity: 1 Low ?  ?  ?   ? ? ?Lyman SpellerLauren Y., PT, DPT  ?Acute Rehabilitation Services  ?Office [phone] ? ?  12/05/2021, 5:01 PM ? ?

## 2021-12-05 NOTE — Progress Notes (Signed)
Occupational Therapy Treatment ?Patient Details ?Name: Donna Simmons ?MRN: [id number] ?DOB: 03-31-1941 ?Today's Date: 12/05/2021 ? ? ?History of present illness Donna Simmons is a 81 year old female with past medical history significant for advanced dementia, paroxysmal atrial fibrillation on Eliquis, hypertension, hyperlipidemia, GERD who presented to [facility] ED for evaluation of altered mental status. ?  ?OT comments ? Treatment focused on participation in ADLs to reduce caregiver burden. Patient overall min assist with hand hold to ambulate in room and min assist with toilet transfer. Patient has the most difficulty with toilet transfer - unsure of reason why as it is not a physical disability - could potentially be visual with the bathroom being a sea of white - requiring heavy verbal and tactile cues to sit. Patient needed max assist for pericare - washing her legs instead wiping herself. Patient able to stand at sink and wash her hands. Yesterday she seemed somewhat irritated with therapist but today just pleasantly confused. Patient able to feed herself after set up - though therapist had to be intentional with removing unnecessary items from tray and removing aluminum fully from top of juice container as she attempted to drink over it. Patient making progress in regards to BADLs. Per notes, patient's family unable to provide physical assistance currently needed to manage patient. Will recommend short term rehab to try to reduce caregiver burden.   ? ?Recommendations for follow up therapy are one component of a multi-disciplinary discharge planning process, led by the attending physician.  Recommendations may be updated based on patient status, additional functional criteria and insurance authorization. ?   ?Follow Up Recommendations ? Skilled nursing-short term rehab (<3 hours/day)  ?  ?Assistance Recommended at Discharge Frequent or constant Supervision/Assistance  ?Patient can return home with the following ?  Assistance with cooking/housework;Direct supervision/assist for financial management;Assist for transportation;Help with stairs or ramp for entrance;Direct supervision/assist for medications management;A little help with walking and/or transfers;A lot of help with bathing/dressing/bathroom;Assistance with feeding ?  ?Equipment Recommendations ? None recommended by OT  ?  ?Recommendations for Other Services   ? ?  ?Precautions / Restrictions Precautions ?Precautions: Fall ?Precaution Comments: episodes of incontinence ?Restrictions ?Weight Bearing Restrictions: No  ? ? ?  ? ?   ?Balance Overall balance assessment: Mild deficits observed, not formally tested ?  ?  ?  ?  ?  ?  ?  ?  ?  ?  ?  ?  ?  ?  ?  ?  ?  ?  ?   ? ?ADL either performed or assessed with clinical judgement  ? ?ADL Overall ADL's : Needs assistance/impaired ?Eating/Feeding: Set up;Sitting ?Eating/Feeding Details (indicate cue type and reason): set up to perform self feeding. Attempted to eat orange slice with peel 0 and therapist removed. Unable to manuever around alimunum foil on juice cup and therapist removed fully. ?Grooming: Standing;Min guard;Cueing for sequencing ?Grooming Details (indicate cue type and reason): stood at sink to wash hands with good quality. ?  ?  ?Lower Body Bathing: Set up;Sitting/lateral leans ?Lower Body Bathing Details (indicate cue type and reason): washed legs seated on toilet ?  ?  ?  ?  ?Toilet Transfer: Minimal assistance;Cueing for sequencing ?Toilet Transfer Details (indicate cue type and reason): heavy verbal and tactile cues to sit on toilet ?Toileting- Clothing Manipulation and Hygiene: Maximal assistance;Sit to/from stand;Cueing for sequencing ?Toileting - Clothing Manipulation Details (indicate cue type and reason): washed legs instead of wiping herself, ?  ?  ?Functional mobility during ADLs:  Minimal assistance ?General ADL Comments: Hand hold to ambulate in room ?  ? ?Extremity/Trunk Assessment Upper Extremity  Assessment ?Upper Extremity Assessment: Overall WFL for tasks assessed ?  ?  ?  ?  ?  ? ?Vision   ?Vision Assessment?: No apparent visual deficits ?  ?   ?   ? ?Cognition Arousal/Alertness: Awake/alert ?Behavior During Therapy: [facility] for tasks assessed/performed ?Overall Cognitive Status: History of cognitive impairments - at baseline ?  ?  ?  ?  ?  ?  ?  ?  ?  ?  ?  ?  ?  ?  ?  ?  ?General Comments: Alert to self only. Improved ability to follow commands today. required the most verbal and tactile assistance to transfer to toilet (once again) ?  ?  ?   ?   ?   ?   ? ? ?Pertinent Vitals/ Pain       Pain Assessment ?Pain Assessment: No/denies pain ? ? ?Frequency ? Min 2X/week  ? ? ? ? ?  ?Progress Toward Goals ? ?OT Goals(current goals can now be found in the care plan section) ? Progress towards OT goals: Progressing toward goals ? ?Acute Rehab OT Goals ?OT Goal Formulation: Patient unable to participate in goal setting ?Time For Goal Achievement: 12/18/21 ?Potential to Achieve Goals: Fair  ?Plan Discharge plan needs to be updated   ? ?Co-evaluation ? ? ?   ?  ?  ?  ?  ? ?  ?AM-PAC OT "6 Clicks" Daily Activity     ?Outcome Measure ? ? Help from another person eating meals?: A Little ?Help from another person taking care of personal grooming?: A Little ?Help from another person toileting, which includes using toliet, bedpan, or urinal?: A Lot ?Help from another person bathing (including washing, rinsing, drying)?: A Lot ?Help from another person to put on and taking off regular upper body clothing?: A Lot ?Help from another person to put on and taking off regular lower body clothing?: A Lot ?6 Click Score: 14 ? ?  ?End of Session Equipment Utilized During Treatment: Rolling walker (2 wheels);Gait belt ? ?OT Visit Diagnosis: Unsteadiness on feet (R26.81);Muscle weakness (generalized) (M62.81) ?  ?Activity Tolerance Patient tolerated treatment well ?  ?Patient Left in chair;with call bell/phone within reach;with chair  alarm set ?  ?Nurse Communication Mobility status (needs setup for meals) ?  ? ?   ? ?Time: 2751-7001 ?OT Time Calculation (min): 23 min ? ?Charges: OT General Charges ?$OT Visit: 1 Visit ?OT Treatments ?$Self Care/Home Management : 23-37 mins ? ?Tal Neer, OTR/L ?Acute Care Rehab Services  ?Office [phone] ?Pager: [phone]  ? ?Miqueas Whilden L Kye Silverstein ?12/05/2021, 10:39 AM ?

## 2021-12-05 NOTE — TOC Initial Note (Signed)
Transition of Care (TOC) - Initial/Assessment Note  ? ? ?Patient Details  ?Name: Donna Simmons ?MRN: [id number] ?Date of Birth: 08/12/1941 ? ?Transition of Care (TOC) CM/SW Contact:    ?Anslee Micheletti, LCSW ?Phone Number: ?12/05/2021, 11:33 AM ? ?Clinical Narrative:                 ?Spoke with pt's spouse today to discuss dc planning needs.  Pt familiar to [facility] from prior stay in February at which time she discharged to SNF.  Pt is disoriented x 4 and currently in non-violent restraints.  Spouse reports that he was concerned that he was not able to provide needed care at home and that pt may have become dehydrated which prompted the EMS call/ hospital readmission.  He is requesting SNF rehab again.  Explained to spouse that [facility] will attempt to place pt in SNF with insurance authorization, however, concerns that she really is more appropriate for LTC placement.  Spouse agrees but would like to pursue ins coverage if possible. I reviewed general eligibility requirements for [place] Medicaid with spouse and explained the cost anticipated for LTC if she does not qualify for Medicaid - spouse states understanding.  Will begin SNF placement process. ? ?Expected Discharge Plan: Skilled Nursing Facility ?Barriers to Discharge: SNF Pending bed offer, Insurance Authorization, Requiring sitter/restraints ? ? ?Patient Goals and CMS Choice ?Patient states their goals for this hospitalization and ongoing recovery are:: spouse goal is for SNF placement ?  ?  ? ?Expected Discharge Plan and Services ?Expected Discharge Plan: Skilled Nursing Facility ?In-house Referral: Clinical Social Work ?  ?Post Acute Care Choice: Skilled Nursing Facility ?Living arrangements for the past 2 months: Single Family Home ?                ?DME Arranged: N/A ?DME Agency: NA ?  ?  ?  ?  ?  ?  ?  ?  ? ?Prior Living Arrangements/Services ?Living arrangements for the past 2 months: Single Family Home ?Lives with:: Spouse ?Patient language and need for interpreter  reviewed:: Yes ?Do you feel safe going back to the place where you live?:  (unable to assess)      ?Need for Family Participation in Patient Care: Yes (Comment) ?Care giver support system in place?: Yes (comment) ?  ?Criminal Activity/Legal Involvement Pertinent to Current Situation/Hospitalization: No - Comment as needed ? ?Activities of Daily Living ?Home Assistive Devices/Equipment: Eyeglasses ?ADL Screening (condition at time of admission) ?Patient's cognitive ability adequate to safely complete daily activities?: No ?Is the patient deaf or have difficulty hearing?: No ?Does the patient have difficulty seeing, even when wearing glasses/contacts?: No ?Does the patient have difficulty concentrating, remembering, or making decisions?: Yes ?Patient able to express need for assistance with ADLs?: Yes ?Does the patient have difficulty dressing or bathing?: No ?Independently performs ADLs?: Yes (appropriate for developmental age) ?Does the patient have difficulty walking or climbing stairs?: No ?Weakness of Legs: None ?Weakness of Arms/Hands: None ? ?Permission Sought/Granted ?Permission sought to share information with : Family Supports, Magazine features editor ?  ? Share Information with NAME: Glendell Schlottman ?   ? Permission granted to share info w Relationship: spouse ? Permission granted to share info w Contact Information: [phone] ? ?Emotional Assessment ?  ?Attitude/Demeanor/Rapport: Unable to Assess ?Affect (typically observed): Unable to Assess ?Orientation: :  (disoriented x 4) ?  ?Psych Involvement: No (comment) ? ?Admission diagnosis:  Weakness [R53.1] ?Patient Active Problem List  ? Diagnosis Date Noted  ?  Weakness 12/03/2021  ? GERD (gastroesophageal reflux disease)   ? Generalized weakness 10/14/2021  ? Bilateral lower extremity edema 08/19/2020  ? Other forms of angina pectoris (HCC) 07/25/2018  ? Long term current use of anticoagulant therapy 11/18/2012  ? Hypercholesteremia 09/25/2012   ? DDD (degenerative disc disease), cervical 05/19/2012  ? Paroxysmal atrial fibrillation (HCC) - CHA2DS2-VASc Score 3, on Eliquis 04/20/2012  ? Essential hypertension 04/20/2012  ? ?PCP:  Yolanda Manges, DO ?Pharmacy:   ?[id number] - [place], [place] - [address] ?[address] ?[place] [address] ?Phone: [phone] Fax: [phone] ? ? ? ? ?Social Determinants of Health (SDOH) Interventions ?  ? ?Readmission Risk Interventions ?   ? View : No data to display.  ?  ?  ?  ? ? ? ?

## 2021-12-06 DIAGNOSIS — I10 Essential (primary) hypertension: Secondary | ICD-10-CM | POA: Diagnosis not present

## 2021-12-06 DIAGNOSIS — Z20822 Contact with and (suspected) exposure to covid-19: Secondary | ICD-10-CM | POA: Diagnosis not present

## 2021-12-06 DIAGNOSIS — K5900 Constipation, unspecified: Secondary | ICD-10-CM | POA: Diagnosis not present

## 2021-12-06 DIAGNOSIS — F0390 Unspecified dementia without behavioral disturbance: Secondary | ICD-10-CM | POA: Diagnosis not present

## 2021-12-06 DIAGNOSIS — R531 Weakness: Secondary | ICD-10-CM | POA: Diagnosis not present

## 2021-12-06 DIAGNOSIS — G9341 Metabolic encephalopathy: Secondary | ICD-10-CM | POA: Diagnosis not present

## 2021-12-06 DIAGNOSIS — Z7189 Other specified counseling: Secondary | ICD-10-CM | POA: Diagnosis not present

## 2021-12-06 MED ORDER — SENNA 8.6 MG PO TABS
2.0000 | ORAL_TABLET | Freq: Every day | ORAL | Status: DC
  Administered 2021-12-06: 17.2 mg via ORAL
  Filled 2021-12-06: qty 2

## 2021-12-06 MED ORDER — HALOPERIDOL 0.5 MG PO TABS: 0.2500 mg | ORAL_TABLET | Freq: Every day | ORAL | Status: DC

## 2021-12-06 NOTE — TOC Progression Note (Signed)
Transition of Care (TOC) - Progression Note  ? ? ?Patient Details  ?Name: TESSLYN MCCAGUE ?MRN: [id number] ?Date of Birth: 09/10/1940 ? ?Transition of Care (TOC) CM/SW Contact  ?Jenika Chiem, LCSW ?Phone Number: ?12/06/2021, 1:22 PM ? ?Clinical Narrative:    ?Have reviewed SNF bed offers with pt's spouse who has accepted bed at [facility].  I have started insurance authorization process.  Pt out of all restraints (mittens) as of 1st shift change this morning.  Facility able to accept pt tomorrow/ over weekend dependent on auth.  MD and RN aware. ? ? ?Expected Discharge Plan: [place] ?Barriers to Discharge: SNF Pending bed offer, Insurance Authorization, Requiring sitter/restraints ? ?Expected Discharge Plan and Services ?Expected Discharge Plan: [place] ?In-house Referral: Clinical Social Work ?  ?Post Acute Care Choice: [place] ?Living arrangements for the past 2 months: [place] ?                ?DME Arranged: N/A ?DME Agency: NA ?  ?  ?  ?  ?  ?  ?  ?  ? ? ?Social Determinants of Health (SDOH) Interventions ?  ? ?Readmission Risk Interventions ?   ? View : No data to display.  ?  ?  ?  ? ? ?

## 2021-12-06 NOTE — Progress Notes (Signed)
?PROGRESS NOTE ? ? ? ?Donna Simmons  [id number] DOB: 1940/11/05 DOA: 12/03/2021 ?PCP: [name], DO  ? ? ?Brief Narrative:  ?Donna Simmons is a 81 year old female with past medical history significant for advanced dementia, paroxysmal atrial fibrillation on Eliquis, hypertension, hyperlipidemia, GERD who presented to [facility] ED for evaluation of altered mental status.  History was provided by husband via telephone, he reported at baseline patient able to feed herself, utilize the restroom by herself and changing her close on her own but over the last 3 days she has not been performing these ADLs and with poor oral intake.  Patient's husband also states has been unable to assist his wife due to his own medical conditions.  Patient complains of generalized weakness but denies any specific focal findings.  Patient denies pain, no urinary symptoms or no abdominal pain; although in the setting of advanced dementia. ? ?In the ED, temperature 97.9 ?F, HR 56, RR 14, BP 140/72, SPO2 98% on room air.  Sodium 142, potassium 3.7, chloride 110, CO2 28, glucose 94, BUN 15, creatinine 0.94.  AST 22, ALT 20, total bilirubin 0.2.  Ammonia 21.  WBC 3.3, hemoglobin 11.1, platelets 175.  Urinalysis with large leukocytes, negative nitrite, rare bacteria, 6-10 WBCs.  Head CT without contrast with no acute intracranial abnormality; stable prominence of lateral ventricles related to central predominant atrophy.  Chest x-ray with no active cardiopulmonary disease process.  TRH was consulted for further evaluation management of altered mental status likely secondary to progressive and of advanced dementia. ? ? ? ?Assessment & Plan: ? ?Advanced dementia ?Patient presenting to ED with reported worsening confusion in the setting of advanced dementia and per family report unable to now accomplish ADLs.  Patient previously able to dress, bathe, feed herself at baseline.  Patient is afebrile without leukocytosis.  CT head without acute  findings.  Chest x-ray negative.  Urinalysis unrevealing.  Etiology of her worsening confusion, likely secondary to progression of her advanced dementia. ?-- Palliative care following, appreciate assistance; continue full code/full scope of care per spouse ?--Delirium precautions ?--Get up during the day ?--Encourage a familiar face to remain present throughout the day ?--Keep blinds open and lights on during daylight hours ?--Minimize the use of opioids/benzodiazepines ?--Supportive care ?--Fall precautions ?--Melatonin 3 mg p.o. nightly ?--Haldol 0.5 mg PO BID and as needed for agitation ? ?Urinary incontinence ?Husband reported patient with urinary incontinence.  Urinalysis unrevealing.  Patient is afebrile without leukocytosis.  Urine culture with multiple species present.  Remains afebrile without leukocytosis, no indication for antibiotics at this time. ? ?Essential hypertension ?--BP 124/69 this morning ?--Holding home amlodipine and lisinopril ?--Continue metoprolol succinate 50 mg p.o. daily ? ?Paroxysmal atrial fibrillation ?--Metoprolol succinate 50 mg p.o. daily ?--Flecainide 50mg  PO BID ?--Eliquis 5 mg p.o. twice daily ? ?Hyperlipidemia ?--Crestor 20 mg p.o. daily ? ?GERD ?--Protonix 40 mg p.o. daily ? ?Generalized weakness ?--PT/OT recommends SNF, TOC for placement ? ? ? ?DVT prophylaxis:  ?apixaban (ELIQUIS) tablet 5 mg  ?  Code Status: Full Code ?Family Communication: Updated patient's spouse, Donna Simmons via telephone; continues to advise SNF placement for his wife, seen by palliative care yesterday with continued full code/full scope of care. ? ?Disposition Plan:  ?Level of care: Med-Surg ?Status is: Observation ?The patient remains OBS appropriate and will d/c before 2 midnights. ?  ? ?Consultants:  ?Palliative care ? ?Procedures:  ?None ? ?Antimicrobials:  ?None ? ? ?Subjective: ?Patient seen examined bedside, resting comfortably.  Attempting to  eat breakfast this morning.  Remains pleasantly  confused.  Out of restraints since yesterday.  No family present, husband updated by telephone.  No other questions or concerns at this time.  Patient denies shortness of breath, no chest pain, no abdominal pain.  No acute events overnight per nurse staff. ? ?Given patient's advanced dementia, unable to perform ADLs and poor support at home with husband with his own acute on chronic medical conditions and no other family members for support, unsafe discharge home at this time. Seen by palliative care yesterday and husband request continued full scope/full code at this time and advises for SNF placement. ? ?Objective: ?Vitals:  ? 12/05/21 1312 12/05/21 2143 12/06/21 0440 12/06/21 0950  ?BP: 94/67 117/75 120/77 98/60  ?Pulse: (!) 55 66 60   ?Resp: 14 18 18    ?Temp: (!) 97.5 ?F (36.4 ?C) 98.2 ?F (36.8 ?C) 97.8 ?F (36.6 ?C)   ?TempSrc: Oral Axillary Oral   ?SpO2: 100% 100% 100%   ?Weight:      ?Height:      ? ? ?Intake/Output Summary (Last 24 hours) at 12/06/2021 1026 ?Last data filed at 12/06/2021 [id number] ?Gross per 24 hour  ?Intake 420 ml  ?Output 550 ml  ?Net -130 ml  ? ?Filed Weights  ? 12/04/21 1500  ?Weight: 54.1 kg  ? ? ?Examination: ? ?Physical Exam: ?GEN: NAD, alert, pleasantly confused, not oriented to person/place/time/situation; but does recall husband's name is Donna Simmons; elderly/thin in appearance ?HEENT: NCAT, PERRL, EOMI, sclera clear, MMM ?PULM: CTAB w/o wheezes/crackles, normal respiratory effort, on room air ?CV: RRR w/o M/G/R ?GI: abd soft, NTND, NABS, no R/G/M ?MSK: no peripheral edema, muscle strength globally intact 5/5 bilateral upper/lower extremities ?NEURO: CN II-XII intact, no focal deficits, sensation to light touch intact ?PSYCH: Depressed mood, flat affect ?Integumentary: dry/intact, no rashes or wounds ? ? ? ?Data Reviewed: I have personally reviewed following labs and imaging studies ? ?CBC: ?Recent Labs  ?Lab 12/03/21 ?1840  ?WBC 3.3*  ?NEUTROABS 2.1  ?HGB 11.1*  ?HCT 36.4  ?MCV 84.1  ?PLT 175   ? ?Basic Metabolic Panel: ?Recent Labs  ?Lab 12/03/21 ?1840  ?NA 142  ?K 3.7  ?CL 110  ?CO2 28  ?GLUCOSE 94  ?BUN 15  ?CREATININE 0.94  ?CALCIUM 8.6*  ? ?GFR: ?Estimated Creatinine Clearance: 35.4 mL/min (by C-G formula based on SCr of 0.94 mg/dL). ?Liver Function Tests: ?Recent Labs  ?Lab 12/03/21 ?1840  ?AST 22  ?ALT 20  ?ALKPHOS 54  ?BILITOT 0.2*  ?PROT 6.6  ?ALBUMIN 3.4*  ? ?No results for input(s): LIPASE, AMYLASE in the last 168 hours. ?Recent Labs  ?Lab 12/03/21 ?1840  ?AMMONIA 21  ? ?Coagulation Profile: ?No results for input(s): INR, PROTIME in the last 168 hours. ?Cardiac Enzymes: ?No results for input(s): CKTOTAL, CKMB, CKMBINDEX, TROPONINI in the last 168 hours. ?BNP (last 3 results) ?No results for input(s): PROBNP in the last 8760 hours. ?HbA1C: ?No results for input(s): HGBA1C in the last 72 hours. ?CBG: ?Recent Labs  ?Lab 12/03/21 ?[id number]  ?GLUCAP 77  ? ?Lipid Profile: ?No results for input(s): CHOL, HDL, LDLCALC, TRIG, CHOLHDL, LDLDIRECT in the last 72 hours. ?Thyroid Function Tests: ?No results for input(s): TSH, T4TOTAL, FREET4, T3FREE, THYROIDAB in the last 72 hours. ?Anemia Panel: ?No results for input(s): VITAMINB12, FOLATE, FERRITIN, TIBC, IRON, RETICCTPCT in the last 72 hours. ?Sepsis Labs: ?No results for input(s): PROCALCITON, LATICACIDVEN in the last 168 hours. ? ?Recent Results (from the past 240 hour(s))  ?Resp Panel by  RT-PCR (Flu A&B, Covid) Nasopharyngeal Swab     Status: None  ? Collection Time: 12/03/21  6:40 PM  ? Specimen: Nasopharyngeal Swab; Nasopharyngeal(NP) swabs in vial transport medium  ?Result Value Ref Range Status  ? SARS Coronavirus 2 by RT PCR NEGATIVE NEGATIVE Final  ?  Comment: (NOTE) ?SARS-CoV-2 target nucleic acids are NOT DETECTED. ? ?The SARS-CoV-2 RNA is generally detectable in upper respiratory ?specimens during the acute phase of infection. The lowest ?concentration of SARS-CoV-2 viral copies this assay can detect is ?138 copies/mL. A negative result does not  preclude SARS-Cov-2 ?infection and should not be used as the sole basis for treatment or ?other patient management decisions. A negative result may occur with  ?improper specimen collection/handling, submission o

## 2021-12-06 NOTE — Progress Notes (Signed)
? ?                                                                                                                                                     ?                                                   ?Daily Progress Note  ? ?Patient Name: Donna Simmons       Date: 12/06/2021 ?DOB: September 17, 1940  Age: 81 y.o. MRN#: [id number] ?Attending Physician: Donna [place] Eric J, DO ?Primary Care Physician: Donna Oman, DO ?Admit Date: 12/03/2021 ? ?Reason for Consultation/Follow-up: Establishing goals of care ? ?Patient Profile/HPI:   81 y.o. female  with past medical history of dementia, a-fib, HTN admitted on 12/03/2021 with poor oral intake, worsening lethargy, altered mental status. Workup for any acute reversible illness has been negative so far. She has had some agitation and received haldol and is in restraints. Albumin is slightly low, but not significantly decreased at 3.4. CT scan with no acute intracranial abnomality. ? ?Subjective: ?[name] is awake and alert, pleasant. Requesting a cup of coffee.  ?Per nursing report she ate a full breakfast this morning, but was lethargic after administration of haldol, so did not eat lunch.  ?She has not had a bowel movement this admission.  ?Noted husband has been offered SNF choices and awaiting insurance auth.  ?Attempted to call husband, no answer and no opportunity to leave a voicemail.  ?  ?Review of Systems  ?Unable to perform ROS: Dementia  ? ? ?Physical Exam ?Vitals and nursing note reviewed.  ?Constitutional:   ?   General: She is not in acute distress. ?Cardiovascular:  ?   Rate and Rhythm: Normal rate.  ?Pulmonary:  ?   Effort: Pulmonary effort is normal.  ?Neurological:  ?   Mental Status: She is alert.  ?Psychiatric:  ?   Comments: Pleasantly confused, cooperative, able to follow commands  ?         ? ?Vital Signs: BP (!) 94/58 (BP Location: Right Arm)   Pulse 62   Temp 97.7 ?F (36.5 ?C) (Oral)   Resp 20   Ht 5' (1.524 m)   Wt 54.1 kg   SpO2 98%   BMI 23.29 kg/m?   ?SpO2: SpO2: 98 % ?O2 Device: O2 Device: Room Air ?O2 Flow Rate:   ? ?Intake/output summary:  ?Intake/Output Summary (Last 24 hours) at 12/06/2021 1621 ?Last data filed at 12/06/2021 1400 ?Gross per 24 hour  ?Intake 420 ml  ?Output 600 ml  ?Net -180 ml  ? ?LBM: Last BM Date :  (UTA) ?Baseline Weight: Weight: 54.1 kg ?Most recent weight: Weight: 54.1  kg ? ?     ?Palliative Assessment/Data: PPS: 40% ? ? ? ? ? ?Patient Active Problem List  ? Diagnosis Date Noted  ? Weakness 12/03/2021  ? GERD (gastroesophageal reflux disease)   ? Generalized weakness 10/14/2021  ? Bilateral lower extremity edema 08/19/2020  ? Other forms of angina pectoris ([place]) 07/25/2018  ? Long term current use of anticoagulant therapy 11/18/2012  ? Hypercholesteremia 09/25/2012  ? DDD (degenerative disc disease), cervical 05/19/2012  ? Paroxysmal atrial fibrillation (HCC) - CHA2DS2-VASc Score 3, on Eliquis 04/20/2012  ? Essential hypertension 04/20/2012  ? ? ?Palliative Care Assessment & Plan  ? ? ?Assessment/Recommendations/Plan ? ?Continue full scope care ?Decrease haldol to 0.25mg  QHS to decrease daytime lethargy ?Senna 2 po tonight for constipation ?TOC Referral for outpatient Palliative at SNF ? ? ?Code Status: ?Full code ? ?Prognosis: ? Unable to determine ? ?Discharge Planning: ?[place] for rehab with Palliative care service follow-up ? ?Donna Simmons, AGNP-C ?Palliative Medicine ? ? ?Please contact Palliative Medicine Team phone at [phone] for questions and concerns.  ? ? ? ? ? ? ?

## 2021-12-06 NOTE — [place] FL2 (Signed)
?[place] MEDICAID FL2 LEVEL OF CARE SCREENING TOOL  ?  ? ?IDENTIFICATION  ?Patient Name: ?Donna Simmons Birthdate: September 24, 1940 Sex: female Admission Date (Current Location): ?12/03/2021  ?[place] Number: ? [place] ?  Facility and Address:  ?[facility],  [address], [place] [address] ?     Provider Number: ?[id number]  ?Attending Physician Name and Address:  ?U[place], Eric J, DO ? Relative Name and Phone Number:  ?Kaniyah Lisby ?   ?Current Level of Care: ?Hospital Recommended Level of Care: ?Skilled Nursing Facility Prior Approval Number: ?  ? ?Date Approved/Denied: ?  PASRR Number: ?[id number] A ? ?Discharge Plan: ?SNF ?  ? ?Current Diagnoses: ?Patient Active Problem List  ? Diagnosis Date Noted  ? Weakness 12/03/2021  ? GERD (gastroesophageal reflux disease)   ? Generalized weakness 10/14/2021  ? Bilateral lower extremity edema 08/19/2020  ? Other forms of angina pectoris (HCC) 07/25/2018  ? Long term current use of anticoagulant therapy 11/18/2012  ? Hypercholesteremia 09/25/2012  ? DDD (degenerative disc disease), cervical 05/19/2012  ? Paroxysmal atrial fibrillation (HCC) - CHA2DS2-VASc Score 3, on Eliquis 04/20/2012  ? Essential hypertension 04/20/2012  ? ? ?Orientation RESPIRATION BLADDER Height & Weight   ?  ?Self ? Normal Continent, External catheter Weight: 119 lb 4.3 oz (54.1 kg) ?Height:  5' (152.4 cm)  ?BEHAVIORAL SYMPTOMS/MOOD NEUROLOGICAL BOWEL NUTRITION STATUS  ?    Continent    ?AMBULATORY STATUS COMMUNICATION OF NEEDS Skin   ?Extensive Assist Verbally Normal ?  ?  ?  ?    ?     ?     ? ? ?Personal Care Assistance Level of Assistance  ?Bathing, Feeding, Dressing Bathing Assistance: Limited assistance ?Feeding assistance: Limited assistance ?Dressing Assistance: Limited assistance ?   ? ?Functional Limitations Info  ?    ?  ?   ? ? ?SPECIAL CARE FACTORS FREQUENCY  ?PT (By licensed PT), OT (By licensed OT)   ?  ?PT Frequency: 5x/wk ?OT Frequency: 5x/wk ?  ?  ?  ?    ? ? ?Contractures Contractures Info: Not present  ? ? ?Additional Factors Info  ?Code Status, Allergies, Psychotropic Code Status Info: Full ?Allergies Info: antihistamines, chlorpheniramine-type ?Psychotropic Info: see MAR ?  ?  ?   ? ?Current Medications (12/06/2021):  This is the current hospital active medication list ?Current Facility-Administered Medications  ?Medication Dose Route Frequency Provider Last Rate Last Admin  ? acetaminophen (TYLENOL) tablet 325-650 mg  325-650 mg Oral Daily PRN Mikey College T, MD      ? apixaban Everlene Balls) tablet 5 mg  5 mg Oral BID Mikey College T, MD   5 mg at 12/06/21 0948  ? donepezil (ARICEPT) tablet 5 mg  5 mg Oral QHS Mikey College T, MD   5 mg at 12/05/21 2141  ? feeding supplement (ENSURE ENLIVE / ENSURE PLUS) liquid 237 mL  237 mL Oral BID BM U[place], Eric J, DO   237 mL at 12/06/21 1014  ? flecainide (TAMBOCOR) tablet 50 mg  50 mg Oral BID Mikey College T, MD   50 mg at 12/06/21 0174  ? haloperidol (HALDOL) tablet 0.25 mg  0.25 mg Oral BID Barbara Cower, NP   0.25 mg at 12/06/21 9449  ? haloperidol (HALDOL) tablet 0.25 mg  0.25 mg Oral Q6H PRN Barbara Cower, NP      ? Or  ? haloperidol lactate (HALDOL) injection 0.25 mg  0.25 mg Intramuscular Q6H PRN Barbara Cower, NP      ?  melatonin tablet 3 mg  3 mg Oral QHS [place], Alvira Philips, DO   3 mg at 12/05/21 2140  ? metoprolol succinate (TOPROL-XL) 24 hr tablet 50 mg  50 mg Oral Daily Mikey College T, MD   50 mg at 12/05/21 0843  ? mirabegron ER (MYRBETRIQ) tablet 25 mg  25 mg Oral Daily Mikey College T, MD   25 mg at 12/06/21 2637  ? multivitamin with minerals tablet 1 tablet  1 tablet Oral Daily [place], Eric J, DO   1 tablet at 12/06/21 8588  ? pantoprazole (PROTONIX) EC tablet 40 mg  40 mg Oral Daily Mikey College T, MD   40 mg at 12/06/21 0948  ? rosuvastatin (CRESTOR) tablet 20 mg  20 mg Oral QHS Mikey College T, MD   20 mg at 12/05/21 2140  ? ? ? ?Discharge Medications: ?Please see discharge summary for a list of discharge  medications. ? ?Relevant Imaging Results: ? ?Relevant Lab Results: ? ? ?Additional Information ?SS# [id number] ? ?Uday Jantz, LCSW ? ? ? ? ?

## 2021-12-06 NOTE — Progress Notes (Signed)
Blood pressure 98/60 this a.m. patient is asymptomatic and remains at baseline.  ?

## 2021-12-06 NOTE — Progress Notes (Signed)
RN wasted 4mg  of IM haldol with , RN.  ?

## 2021-12-07 DIAGNOSIS — R531 Weakness: Secondary | ICD-10-CM | POA: Diagnosis not present

## 2021-12-07 MED ORDER — ACETAMINOPHEN 325 MG PO TABS: 325.0000 mg | ORAL_TABLET | Freq: Every day | ORAL | 0 refills | Status: AC | PRN

## 2021-12-07 MED ORDER — ADULT MULTIVITAMIN W/MINERALS CH: 1.0000 | ORAL_TABLET | Freq: Every day | ORAL | 0 refills | Status: AC

## 2021-12-07 MED ORDER — SENNA 8.6 MG PO TABS: 2.0000 | ORAL_TABLET | Freq: Every day | ORAL | 0 refills | Status: DC

## 2021-12-07 MED ORDER — ENSURE ENLIVE PO LIQD
237.0000 mL | Freq: Two times a day (BID) | ORAL | 12 refills | Status: DC
Start: 2021-12-07 — End: 2022-07-01

## 2021-12-07 NOTE — Discharge Summary (Signed)
Physician Discharge Summary  ?Patient ID: ?Donna Simmons ?MRN: [id number] ?DOB/AGE: 79/19/1942 79 y.o. ? ?Admit date: 12/03/2021 ?Discharge date: 12/07/2021 ? ?Admission Diagnoses: ? ?Discharge Diagnoses:  ?Principal Problem: ?  Weakness ?Active Problems: ?  Generalized weakness ? ? ?Discharged Condition: stable ? ?Hospital Course: Donna Simmons is a 81 year old female with past medical history significant for advanced dementia, paroxysmal atrial fibrillation on Eliquis, hypertension, hyperlipidemia, GERD who presented to [facility] emergency department with altered mental status.  History was provided by husband via telephone, he reported at baseline patient able to feed herself, utilize the restroom by herself and changing her close on her own but over the last 3 days preceding presentation, patient was not able to perform any of these ADLs and with poor oral intake.  Patient's husband was also unable to assist the patient due to his own medical conditions.  Patient endorsed generalized weakness, however, denied any specific focal findings.  Patient denied pain, no urinary symptoms or no abdominal pain.  History of advanced dementia is noted. ?  ?In the ED, temperature 97.9 ?F, HR 56, RR 14, BP 140/72, SPO2 98% on room air.  Sodium 142, potassium 3.7, chloride 110, CO2 28, glucose 94, BUN 15, creatinine 0.94.  AST 22, ALT 20, total bilirubin 0.2.  Ammonia 21.  WBC 3.3, hemoglobin 11.1, platelets 175.  Urinalysis with large leukocytes, negative nitrite, rare bacteria, 6-10 WBCs.  Head CT without contrast with no acute intracranial abnormality; stable prominence of lateral ventricles related to central predominant atrophy.  Chest x-ray with no active cardiopulmonary disease process.  TRH was consulted for further evaluation management of altered mental status likely secondary to progressive and of advanced dementia. ? ?Advanced dementia ?Patient presenting to ED with reported worsening confusion in the  setting of advanced dementia and per family report unable to now accomplish ADLs.  Patient previously able to dress, bathe, feed herself at baseline.  Patient is afebrile without leukocytosis.  CT head without acute findings.  Chest x-ray negative.  Urinalysis unrevealing.  Etiology of her worsening confusion, likely secondary to progression of her advanced dementia. ?-- Palliative team was consulted to assist with patient's management. ?-Patient has remained full code.   ?--Delirium precautions ?--Minimize the use of opioids/benzodiazepines ?--Supportive care ?--Fall precautions ?  ?Urinary incontinence ?Husband reported patient with urinary incontinence.  Urinalysis unrevealing.  Patient remained afebrile without leukocytosis.  Urine culture with multiple species present.   ?  ?Essential hypertension ?--BP remained controlled  ?--Holding home amlodipine and lisinopril ?--Continue metoprolol succinate 50 mg p.o. daily ?  ?Paroxysmal atrial fibrillation ?--Metoprolol succinate 50 mg p.o. daily ?--Flecainide 50mg  PO BID ?--Eliquis 5 mg p.o. twice daily ?  ?Hyperlipidemia ?--Crestor 20 mg p.o. daily ?  ?GERD ?-- PPI.   ?  ? ?Consults:  Palliative care ? ?Discharge Exam: ?Blood pressure 114/74, pulse 69, temperature 98.4 ?F (36.9 ?C), temperature source Oral, resp. rate 16, height 5' (1.524 m), weight 54.1 kg, SpO2 100 %. ? ? ?Disposition: Discharge disposition: 03-Skilled Nursing Facility ? ? ? ? ? ? ?Discharge Instructions   ? ? Diet - low sodium heart healthy   Complete by: As directed ?  ? Increase activity slowly   Complete by: As directed ?  ? ?  ? ?Allergies as of 12/07/2021   ? ?   Reactions  ? Antihistamines, Chlorpheniramine-type Palpitations  ? Makes heart beat fast  ? ?  ? ?  ?Medication List  ?  ? ?STOP taking these medications   ? ?  amLODipine 10 MG tablet ?Commonly known as: NORVASC ?  ?Blood Pressure Cuff Misc ?  ?fluticasone 50 MCG/ACT nasal spray ?Commonly known as: FLONASE ?  ?furosemide 20 MG  tablet ?Commonly known as: LASIX ?  ?lisinopril 20 MG tablet ?Commonly known as: ZESTRIL ?  ?memantine 5 MG tablet ?Commonly known as: Namenda ?  ?Potassium 99 MG Tabs ?  ?VITAMIN D3 PO ?  ? ?  ? ?TAKE these medications   ? ?acetaminophen 325 MG tablet ?Commonly known as: TYLENOL ?Take 1-2 tablets (325-650 mg total) by mouth daily as needed for up to 7 days (pain or headaches). ?What changed:  ?medication strength ?how much to take ?reasons to take this ?  ?donepezil 5 MG tablet ?Commonly known as: ARICEPT ?Take 5 mg by mouth at bedtime. ?  ?Eliquis 5 MG Tabs tablet ?Generic drug: apixaban ?Take 1 tablet by mouth twice daily ?What changed: how much to take ?  ?feeding supplement Liqd ?Take 237 mLs by mouth 2 (two) times daily between meals. ?  ?flecainide 50 MG tablet ?Commonly known as: TAMBOCOR ?TAKE 1 TABLET BY MOUTH TWICE DAILY . APPOINTMENT REQUIRED FOR FUTURE REFILLS ?What changed: See the new instructions. ?  ?metoprolol succinate 50 MG 24 hr tablet ?Commonly known as: TOPROL-XL ?TAKE 1 TABLET BY MOUTH ONCE DAILY WITH OR IMMEDIATELY FOLLOWING A MEAL ?What changed:  ?how much to take ?how to take this ?when to take this ?additional instructions ?  ?multivitamin with minerals Tabs tablet ?Take 1 tablet by mouth daily. ?Start taking on: December 08, 2021 ?  ?Myrbetriq 25 MG Tb24 tablet ?Generic drug: mirabegron ER ?Take 25 mg by mouth daily. ?  ?omeprazole 40 MG capsule ?Commonly known as: PRILOSEC ?Take 40 mg by mouth daily. ?  ?rosuvastatin 20 MG tablet ?Commonly known as: CRESTOR ?Take 1 tablet by mouth once daily ?What changed: when to take this ?  ?senna 8.6 MG Tabs tablet ?Commonly known as: SENOKOT ?Take 2 tablets (17.2 mg total) by mouth at bedtime. ?  ? ?  ? ? Contact information for after-discharge care   ? ? Destination   ? ? [facility] Preferred SNF .   ?Service: Skilled Nursing ?Contact information: ?[address] ?[place] ?[phone] ? ?  ?  ? ?  ?  ? ?  ?   ? ?  ? ? ?Signed: ?Bonnell Public ?12/07/2021, 10:48 AM ? ? ?

## 2021-12-07 NOTE — Progress Notes (Signed)
Pt d/c via [facility] and transported to SNF w all belongings in stable condition. ?

## 2021-12-07 NOTE — Progress Notes (Signed)
Report called to [facility]. ?

## 2021-12-07 NOTE — TOC Transition Note (Signed)
Transition of Care (TOC) - CM/SW Discharge Note ? ? ?Patient Details  ?Name: RHYTHM GUBBELS ?MRN: [id number] ?Date of Birth: [birth year] ? ?Transition of Care (TOC) CM/SW Contact:  ?Milicent Acheampong, LCSW ?Phone Number: ?12/07/2021, 11:37 AM ? ? ?Clinical Narrative:    ?Have received insurance authorization for SNF and pt / spouse have accepted bed again at [facility].  [facility] called at 11:30am.  RN to call report to [phone].  No further TOC needs. ? ? ?Final next level of care: Skilled Nursing Facility ?Barriers to Discharge: Barriers Resolved ? ? ?Patient Goals and CMS Choice ?Patient states their goals for this hospitalization and ongoing recovery are:: spouse goal is for SNF placement ?  ?  ? ?Discharge Placement ?  ?Existing PASRR number confirmed : 12/05/21          ?Patient chooses bed at: [facility] ?Patient to be transferred to facility by: [facility] ?Name of family member notified: spouse ?Patient and family notified of of transfer: 12/07/21 ? ?Discharge Plan and Services ?In-house Referral: Clinical Social Work ?  ?Post Acute Care Choice: Skilled Nursing Facility          ?DME Arranged: N/A ?DME Agency: NA ?  ?  ?  ?  ?  ?  ?  ?  ? ?Social Determinants of Health (SDOH) Interventions ?  ? ? ?Readmission Risk Interventions ?   ? View : No data to display.  ?  ?  ?  ? ? ? ? ? ?

## 2021-12-30 ENCOUNTER — Encounter (HOSPITAL_COMMUNITY): Payer: Self-pay

## 2021-12-30 ENCOUNTER — Emergency Department (HOSPITAL_COMMUNITY)
Admission: EM | Admit: 2021-12-30 | Discharge: 2021-12-30 | Disposition: A | Discharge status: Home / Self Care | Payer: Medicare PPO | Attending: Emergency Medicine | Admitting: Emergency Medicine

## 2021-12-30 ENCOUNTER — Other Ambulatory Visit: Payer: Self-pay

## 2021-12-30 DIAGNOSIS — D649 Anemia, unspecified: Secondary | ICD-10-CM | POA: Diagnosis not present

## 2021-12-30 DIAGNOSIS — R791 Abnormal coagulation profile: Secondary | ICD-10-CM | POA: Diagnosis not present

## 2021-12-30 DIAGNOSIS — K625 Hemorrhage of anus and rectum: Secondary | ICD-10-CM | POA: Diagnosis not present

## 2021-12-30 DIAGNOSIS — Z7901 Long term (current) use of anticoagulants: Secondary | ICD-10-CM | POA: Diagnosis not present

## 2021-12-30 DIAGNOSIS — Z79899 Other long term (current) drug therapy: Secondary | ICD-10-CM | POA: Insufficient documentation

## 2021-12-30 DIAGNOSIS — R410 Disorientation, unspecified: Secondary | ICD-10-CM | POA: Insufficient documentation

## 2021-12-30 DIAGNOSIS — F0390 Unspecified dementia without behavioral disturbance: Secondary | ICD-10-CM | POA: Insufficient documentation

## 2021-12-30 DIAGNOSIS — R600 Localized edema: Secondary | ICD-10-CM | POA: Insufficient documentation

## 2021-12-30 LAB — CBC WITH DIFFERENTIAL/PLATELET
Abs Immature Granulocytes: 0.01 10*3/uL (ref 0.00–0.07)
Basophils Absolute: 0 10*3/uL (ref 0.0–0.1)
Basophils Relative: 0 %
Eosinophils Absolute: 0 10*3/uL (ref 0.0–0.5)
Eosinophils Relative: 1 %
HCT: 33.5 % — ABNORMAL LOW (ref 36.0–46.0)
Hemoglobin: 10.5 g/dL — ABNORMAL LOW (ref 12.0–15.0)
Immature Granulocytes: 0 %
Lymphocytes Relative: 19 %
Lymphs Abs: 0.9 10*3/uL (ref 0.7–4.0)
MCH: 26 pg (ref 26.0–34.0)
MCHC: 31.3 g/dL (ref 30.0–36.0)
MCV: 82.9 fL (ref 80.0–100.0)
Monocytes Absolute: 0.3 10*3/uL (ref 0.1–1.0)
Monocytes Relative: 7 %
Neutro Abs: 3.3 10*3/uL (ref 1.7–7.7)
Neutrophils Relative %: 73 %
Platelets: 229 10*3/uL (ref 150–400)
RBC: 4.04 MIL/uL (ref 3.87–5.11)
RDW: 16.5 % — ABNORMAL HIGH (ref 11.5–15.5)
WBC: 4.6 10*3/uL (ref 4.0–10.5)
nRBC: 0 % (ref 0.0–0.2)

## 2021-12-30 LAB — TYPE AND SCREEN
ABO/RH(D): O POS
Antibody Screen: POSITIVE
PT AG Type: NEGATIVE

## 2021-12-30 LAB — BASIC METABOLIC PANEL
Anion gap: 4 — ABNORMAL LOW (ref 5–15)
BUN: 19 mg/dL (ref 8–23)
CO2: 27 mmol/L (ref 22–32)
Calcium: 8.7 mg/dL — ABNORMAL LOW (ref 8.9–10.3)
Chloride: 111 mmol/L (ref 98–111)
Creatinine, Ser: 0.94 mg/dL (ref 0.44–1.00)
GFR, Estimated: >60 mL/min (ref >60)
Glucose, Bld: 96 mg/dL (ref 70–99)
Potassium: 3.8 mmol/L (ref 3.5–5.1)
Sodium: 142 mmol/L (ref 135–145)

## 2021-12-30 LAB — PROTIME-INR
INR: 1.5 — ABNORMAL HIGH (ref 0.8–1.2)
Prothrombin Time: 17.9 seconds — ABNORMAL HIGH (ref 11.4–15.2)

## 2021-12-30 LAB — POC OCCULT BLOOD, ED: Fecal Occult Bld: POSITIVE — AB

## 2021-12-30 NOTE — ED Notes (Signed)
Pt awaiting ride back home, attempted to call husband again, no answer and no voicemail.  Pt attempting to get up out of bed multiple times. Pt ambualtory to restroom w/staff assist. ?

## 2021-12-30 NOTE — ED Notes (Signed)
Patient's husband was contacted and he said that he would be here around 1900 today to come and get her.  ?

## 2021-12-30 NOTE — Discharge Instructions (Signed)
You were seen in the emergency department for possible rectal bleeding.  He did have a sore on your buttock that could be a source of the bleeding.  You did not have any further bleeding while you were here.  Your blood counts were stable.  Please follow-up with your primary care doctor and Dr. Elnoria Howard GI.  Return if any worsening bleeding or other concerns. ?

## 2021-12-30 NOTE — ED Provider Notes (Signed)
?[facility] DEPT ?Provider Note ? ? ?CSN: [id number] ?Arrival date & time: 12/30/21  1355 ? ?  ? ?History ? ?Chief Complaint  ?Patient presents with  ? Rectal Bleeding  ? ? ?Donna Simmons is a 81 y.o. female.  5 caveat secondary to dementia.  She is brought in from home by ambulance for evaluation of possible rectal bleeding.  Patient does not recall the bleeding and denies any pain.  She lives with her husband.  I called him and he said when she got out of her chair he noticed some spots of blood on the pad that she was sitting on.  She is never had prior bleeding. ? ?The history is provided by the patient, the spouse and the EMS personnel.  ?Rectal Bleeding ?Quality:  Unable to specify ?Amount:  Scant ?Duration:  1 hour ?Chronicity:  New ?Similar prior episodes: no   ?Relieved by:  None tried ?Worsened by:  Nothing ?Ineffective treatments:  None tried ?Associated symptoms: no abdominal pain, no epistaxis, no fever, no hematemesis, no loss of consciousness and no vomiting   ?Risk factors: anticoagulant use   ? ?  ? ?Home Medications ?Prior to Admission medications   ?Medication Sig Start Date End Date Taking? Authorizing Provider  ?apixaban (ELIQUIS) 5 MG TABS tablet Take 1 tablet by mouth twice daily ?Patient taking differently: Take 5 mg by mouth 2 (two) times daily. 08/08/21   Leonie Man, MD  ?donepezil (ARICEPT) 5 MG tablet Take 5 mg by mouth at bedtime. 07/22/21   [provider]  ?feeding supplement (ENSURE ENLIVE / ENSURE PLUS) LIQD Take 237 mLs by mouth 2 (two) times daily between meals. 12/07/21   Bonnell Public, MD  ?flecainide (TAMBOCOR) 50 MG tablet TAKE 1 TABLET BY MOUTH TWICE DAILY . APPOINTMENT REQUIRED FOR FUTURE REFILLS ?Patient taking differently: Take 50 mg by mouth 2 (two) times daily. 01/14/21   Leonie Man, MD  ?metoprolol succinate (TOPROL-XL) 50 MG 24 hr tablet TAKE 1 TABLET BY MOUTH ONCE DAILY WITH OR IMMEDIATELY FOLLOWING A MEAL ?Patient  taking differently: Take 50 mg by mouth daily. 04/02/21   Leonie Man, MD  ?Multiple Vitamin (MULTIVITAMIN WITH MINERALS) TABS tablet Take 1 tablet by mouth daily. 12/08/21 01/07/22  Dana Allan I, MD  ?MYRBETRIQ 25 MG TB24 tablet Take 25 mg by mouth daily. 09/15/20   [provider]  ?omeprazole (PRILOSEC) 40 MG capsule Take 40 mg by mouth daily.    [provider]  ?rosuvastatin (CRESTOR) 20 MG tablet Take 1 tablet by mouth once daily ?Patient taking differently: Take 20 mg by mouth at bedtime. 08/08/20   Leonie Man, MD  ?senna (SENOKOT) 8.6 MG TABS tablet Take 2 tablets (17.2 mg total) by mouth at bedtime. 12/07/21   Bonnell Public, MD  ?   ? ?Allergies    ?Antihistamines, chlorpheniramine-type   ? ?Review of Systems   ?Review of Systems  ?Unable to perform ROS: Dementia  ?Constitutional:  Negative for fever.  ?HENT:  Negative for nosebleeds.   ?Gastrointestinal:  Positive for hematochezia. Negative for abdominal pain, hematemesis and vomiting.  ?Neurological:  Negative for loss of consciousness.  ? ?Physical Exam ?Updated Vital Signs ?BP 120/71   Pulse (!) 57   Temp 98 ?F (36.7 ?C) (Oral)   Resp 15   Ht 5' (1.524 m)   Wt 54.4 kg   SpO2 100%   BMI 23.44 kg/m?  ?Physical Exam ?Vitals and nursing note reviewed.  ?  Constitutional:   ?   General: She is not in acute distress. ?   Appearance: Normal appearance. She is well-developed.  ?HENT:  ?   Head: Normocephalic and atraumatic.  ?Eyes:  ?   Conjunctiva/sclera: Conjunctivae normal.  ?Cardiovascular:  ?   Rate and Rhythm: Normal rate and regular rhythm.  ?   Heart sounds: No murmur heard. ?Pulmonary:  ?   Effort: Pulmonary effort is normal. No respiratory distress.  ?   Breath sounds: Normal breath sounds.  ?Abdominal:  ?   Palpations: Abdomen is soft.  ?   Tenderness: There is no abdominal tenderness. There is no guarding or rebound.  ?Genitourinary: ?   Comments: She has a small circular skin breakdown area approximately 1 cm on  her right buttock.  Rectal exam done with nurse Jarrett Soho chaperone.  Normal tone no masses.  Sample sent to lab for guaiac ?Musculoskeletal:     ?   General: No swelling. Normal range of motion.  ?   Cervical back: Neck supple.  ?   Right lower leg: Edema present.  ?   Left lower leg: Edema present.  ?Skin: ?   General: Skin is warm and dry.  ?   Capillary Refill: Capillary refill takes less than 2 seconds.  ?Neurological:  ?   General: No focal deficit present.  ?   Mental Status: She is alert. She is disoriented.  ? ? ?ED Results / Procedures / Treatments   ?Labs ?(all labs ordered are listed, but only abnormal results are displayed) ?Labs Reviewed  ?BASIC METABOLIC PANEL - Abnormal; Notable for the following components:  ?    Result Value  ? Calcium 8.7 (*)   ? Anion gap 4 (*)   ? All other components within normal limits  ?CBC WITH DIFFERENTIAL/PLATELET - Abnormal; Notable for the following components:  ? Hemoglobin 10.5 (*)   ? HCT 33.5 (*)   ? RDW 16.5 (*)   ? All other components within normal limits  ?PROTIME-INR - Abnormal; Notable for the following components:  ? Prothrombin Time 17.9 (*)   ? INR 1.5 (*)   ? All other components within normal limits  ?POC OCCULT BLOOD, ED - Abnormal; Notable for the following components:  ? Fecal Occult Bld POSITIVE (*)   ? All other components within normal limits  ?TYPE AND SCREEN  ? ? ?EKG ?None ? ?Radiology ?No results found. ? ?Procedures ?Procedures  ? ? ?Medications Ordered in ED ?Medications - No data to display ? ?ED Course/ Medical Decision Making/ A&P ?Clinical Course as of 12/30/21 1904  ?Mon Dec 30, 2021  ?1550 Discussed with patient's GI doctor Dr. Benson Norway.  He felt that that hemoglobin is stable for her and she can follow-up outpatient in the office. [MB]  ?  ?Clinical Course User Index ?[MB] Hayden Rasmussen, MD  ? ?                        ?Medical Decision Making ?Amount and/or Complexity of Data Reviewed ?Labs: ordered. ? ?This patient complains of some  rectal bleeding; this involves an extensive number of treatment ?Options and is a complaint that carries with it a high risk of complications and ?morbidity. The differential includes rectal bleeding, hemorrhoids, AVM, diverticulosis, hematuria ? ?I ordered, reviewed and interpreted labs, which included CBC with a stable white count, hemoglobin low better than priors, chemistries normal BUN and creatinine, INR elevated, fecal occult trace positive ?Additional history  obtained from EMS and patient's husband ?Previous records obtained and reviewed in epic, follows mostly at [facility] ?I consulted Dr. Benson Norway GI and discussed lab and imaging findings and discussed disposition.  ?Cardiac monitoring reviewed, sinus bradycardia ?Social determinants considered, patient with dementia limited ability to advocate for self ?Critical Interventions: None ? ?After the interventions stated above, I reevaluated the patient and found patient to be asymptomatic here with no signs of active bleeding ?Admission and further testing considered, no indications for admission at this time.  Patient will need outpatient follow-up with her GI provider. ? ? ? ? ? ? ? ? ? ?Final Clinical Impression(s) / ED Diagnoses ?Final diagnoses:  ?Rectal bleeding  ? ? ?Rx / DC Orders ?ED Discharge Orders   ? ? None  ? ?  ? ? ?  ?Hayden Rasmussen, MD ?12/30/21 1906 ? ?

## 2021-12-30 NOTE — ED Triage Notes (Signed)
Pt from home via EMS with c/o possible rectal bleeding. Pt's husband stated that he noticed some blood underneath her bottom when she was moved an hour ago.  ? ?Pt has hx of dementia. A&Ox1 oriented to self. ?

## 2022-01-05 ENCOUNTER — Emergency Department (HOSPITAL_COMMUNITY)
Admission: EM | Admit: 2022-01-05 | Discharge: 2022-01-05 | Disposition: A | Discharge status: Home / Self Care | Payer: Medicare PPO | Attending: Student | Admitting: Student

## 2022-01-05 DIAGNOSIS — L98411 Non-pressure chronic ulcer of buttock limited to breakdown of skin: Secondary | ICD-10-CM | POA: Diagnosis not present

## 2022-01-05 DIAGNOSIS — I4891 Unspecified atrial fibrillation: Secondary | ICD-10-CM | POA: Insufficient documentation

## 2022-01-05 DIAGNOSIS — L89321 Pressure ulcer of left buttock, stage 1: Secondary | ICD-10-CM

## 2022-01-05 DIAGNOSIS — N3001 Acute cystitis with hematuria: Secondary | ICD-10-CM | POA: Diagnosis present

## 2022-01-05 DIAGNOSIS — Z7901 Long term (current) use of anticoagulants: Secondary | ICD-10-CM | POA: Insufficient documentation

## 2022-01-05 DIAGNOSIS — L89311 Pressure ulcer of right buttock, stage 1: Secondary | ICD-10-CM

## 2022-01-05 DIAGNOSIS — Z79899 Other long term (current) drug therapy: Secondary | ICD-10-CM | POA: Diagnosis not present

## 2022-01-05 DIAGNOSIS — I10 Essential (primary) hypertension: Secondary | ICD-10-CM | POA: Insufficient documentation

## 2022-01-05 LAB — URINALYSIS, ROUTINE W REFLEX MICROSCOPIC
Bilirubin Urine: NEGATIVE
Glucose, UA: NEGATIVE mg/dL
Ketones, ur: NEGATIVE mg/dL
Nitrite: POSITIVE — AB
Protein, ur: NEGATIVE mg/dL
Specific Gravity, Urine: 1.023 (ref 1.005–1.030)
WBC, UA: >50 WBC/hpf — ABNORMAL HIGH (ref 0–5)
pH: 5 (ref 5.0–8.0)

## 2022-01-05 LAB — DIFFERENTIAL
Abs Immature Granulocytes: 0.01 10*3/uL (ref 0.00–0.07)
Basophils Absolute: 0 10*3/uL (ref 0.0–0.1)
Basophils Relative: 0 %
Eosinophils Absolute: 0 10*3/uL (ref 0.0–0.5)
Eosinophils Relative: 1 %
Immature Granulocytes: 0 %
Lymphocytes Relative: 28 %
Lymphs Abs: 1.2 10*3/uL (ref 0.7–4.0)
Monocytes Absolute: 0.3 10*3/uL (ref 0.1–1.0)
Monocytes Relative: 7 %
Neutro Abs: 2.7 10*3/uL (ref 1.7–7.7)
Neutrophils Relative %: 64 %

## 2022-01-05 LAB — BASIC METABOLIC PANEL
Anion gap: 5 (ref 5–15)
BUN: 24 mg/dL — ABNORMAL HIGH (ref 8–23)
CO2: 26 mmol/L (ref 22–32)
Calcium: 9 mg/dL (ref 8.9–10.3)
Chloride: 112 mmol/L — ABNORMAL HIGH (ref 98–111)
Creatinine, Ser: 0.95 mg/dL (ref 0.44–1.00)
GFR, Estimated: >60 mL/min (ref >60)
Glucose, Bld: 91 mg/dL (ref 70–99)
Potassium: 3.9 mmol/L (ref 3.5–5.1)
Sodium: 143 mmol/L (ref 135–145)

## 2022-01-05 LAB — CBC
HCT: 34.5 % — ABNORMAL LOW (ref 36.0–46.0)
Hemoglobin: 10.7 g/dL — ABNORMAL LOW (ref 12.0–15.0)
MCH: 26 pg (ref 26.0–34.0)
MCHC: 31 g/dL (ref 30.0–36.0)
MCV: 83.7 fL (ref 80.0–100.0)
Platelets: 215 10*3/uL (ref 150–400)
RBC: 4.12 MIL/uL (ref 3.87–5.11)
RDW: 16.3 % — ABNORMAL HIGH (ref 11.5–15.5)
WBC: 4.3 10*3/uL (ref 4.0–10.5)
nRBC: 0 % (ref 0.0–0.2)

## 2022-01-05 LAB — HEPATIC FUNCTION PANEL
ALT: 16 U/L (ref 0–44)
AST: 20 U/L (ref 15–41)
Albumin: 3.3 g/dL — ABNORMAL LOW (ref 3.5–5.0)
Alkaline Phosphatase: 52 U/L (ref 38–126)
Bilirubin, Direct: 0.1 mg/dL (ref 0.0–0.2)
Indirect Bilirubin: 0.5 mg/dL (ref 0.3–0.9)
Total Bilirubin: 0.6 mg/dL (ref 0.3–1.2)
Total Protein: 7 g/dL (ref 6.5–8.1)

## 2022-01-05 MED ORDER — LORAZEPAM 2 MG/ML IJ SOLN
1.0000 mg | Freq: Once | INTRAMUSCULAR | Status: AC
  Administered 2022-01-05: 1 mg via INTRAMUSCULAR
  Filled 2022-01-05: qty 1

## 2022-01-05 MED ORDER — SODIUM CHLORIDE 0.9 % IV SOLN
1.0000 g | Freq: Once | INTRAVENOUS | Status: AC
  Administered 2022-01-05: 1 g via INTRAVENOUS
  Filled 2022-01-05: qty 10

## 2022-01-05 MED ORDER — CEPHALEXIN 500 MG PO CAPS: 500.0000 mg | ORAL_CAPSULE | Freq: Two times a day (BID) | ORAL | 0 refills | Status: AC

## 2022-01-05 NOTE — ED Provider Notes (Addendum)
?[facility] DEPT ?Provider Note ? ? ?CSN: [id number] ?Arrival date & time: 01/05/22  1221 ? ?  ? ?History} ?Chief Complaint  ?Patient presents with  ? Urinary Tract Infection  ? ? ?Donna Simmons is a 81 y.o. female with history of dementia, A-fib, hypertension presents to the emergency department via EMS.  The patient is pleasantly demented and is unable to provide majority of the history.  She is in no acute distress.  I called and spoke to her husband, Janiyha Holub, who provided the majority of the story.  The patient's been acting otherwise normal for the past few days, however this morning, the patient did not want to get up and walk because she reports it was causing her pain, and he was concerned for the sores on her bottom causing this so he called EMS. He denies that she has mentioned any chest pain, shortness of breath, headaches.  Denies any weakness.  He denies any seeing any blood or dark bowel movements.  Denies that she is complained about any urinary pain.  He does report that she has some sores on her bottom that he was treating.  Otherwise, he denies any recent falls.  She walks with a walker occasionally at home.  She is at her baseline per husband. ? ? ? ?Urinary Tract Infection ? ?  ? ?Home Medications ?Prior to Admission medications   ?Medication Sig Start Date End Date Taking? Authorizing Provider  ?apixaban (ELIQUIS) 5 MG TABS tablet Take 1 tablet by mouth twice daily ?Patient taking differently: Take 5 mg by mouth 2 (two) times daily. 08/08/21   Leonie Man, MD  ?donepezil (ARICEPT) 5 MG tablet Take 5 mg by mouth at bedtime. 07/22/21   [provider]  ?feeding supplement (ENSURE ENLIVE / ENSURE PLUS) LIQD Take 237 mLs by mouth 2 (two) times daily between meals. 12/07/21   Bonnell Public, MD  ?flecainide (TAMBOCOR) 50 MG tablet TAKE 1 TABLET BY MOUTH TWICE DAILY . APPOINTMENT REQUIRED FOR FUTURE REFILLS ?Patient taking differently: Take 50 mg  by mouth 2 (two) times daily. 01/14/21   Leonie Man, MD  ?metoprolol succinate (TOPROL-XL) 50 MG 24 hr tablet TAKE 1 TABLET BY MOUTH ONCE DAILY WITH OR IMMEDIATELY FOLLOWING A MEAL ?Patient taking differently: Take 50 mg by mouth daily. 04/02/21   Leonie Man, MD  ?Multiple Vitamin (MULTIVITAMIN WITH MINERALS) TABS tablet Take 1 tablet by mouth daily. 12/08/21 01/07/22  Dana Allan I, MD  ?MYRBETRIQ 25 MG TB24 tablet Take 25 mg by mouth daily. 09/15/20   [provider]  ?omeprazole (PRILOSEC) 40 MG capsule Take 40 mg by mouth daily.    [provider]  ?rosuvastatin (CRESTOR) 20 MG tablet Take 1 tablet by mouth once daily ?Patient taking differently: Take 20 mg by mouth at bedtime. 08/08/20   Leonie Man, MD  ?senna (SENOKOT) 8.6 MG TABS tablet Take 2 tablets (17.2 mg total) by mouth at bedtime. 12/07/21   Bonnell Public, MD  ?   ? ?Allergies    ?Antihistamines, chlorpheniramine-type   ? ?Review of Systems   ?Review of Systems  ?Unable to perform ROS: Dementia  ? ?Physical Exam ?Updated Vital Signs ?BP 129/72   Pulse (!) 57   Temp 97.7 ?F (36.5 ?C) (Oral)   Resp 12   Ht 5\' 3"  (1.6 m)   Wt 54.4 kg   SpO2 97%   BMI 21.26 kg/m?  ?Physical Exam ?Vitals and nursing note  reviewed. Exam conducted with a chaperone present Caryl Pina. RN).  ?Constitutional:   ?   General: She is not in acute distress. ?   Appearance: She is not toxic-appearing.  ?   Comments: Chronically ill appearing, but pleasantly demented  ?Cardiovascular:  ?   Rate and Rhythm: Regular rhythm. Bradycardia present.  ?   Pulses: Normal pulses.  ?Pulmonary:  ?   Effort: Pulmonary effort is normal. No respiratory distress.  ?Abdominal:  ?   General: Bowel sounds are normal.  ?   Palpations: Abdomen is soft.  ?   Tenderness: There is no abdominal tenderness. There is no guarding or rebound.  ?Musculoskeletal:  ?   Right lower leg: Edema present.  ?   Left lower leg: Edema present.  ?Skin: ?   Comments: 3 smaller than  dime size superficial stage I ulcers noted on bilateral buttocks.  No purulence noted.  No surrounding induration or fluctuance.  No increased warmth.  Nontender to palpation.  ?Neurological:  ?   Mental Status: She is alert. Mental status is at baseline.  ?   Comments: Ambulatory with assistance which is baseline for patient. Answering with normal speech. Alert and Oriented to person, which is baseline for patient per husband.  ? ? ?ED Results / Procedures / Treatments   ?Labs ?(all labs ordered are listed, but only abnormal results are displayed) ?Labs Reviewed  ?CBC - Abnormal; Notable for the following components:  ?    Result Value  ? Hemoglobin 10.7 (*)   ? HCT 34.5 (*)   ? RDW 16.3 (*)   ? All other components within normal limits  ?BASIC METABOLIC PANEL  ?DIFFERENTIAL  ?HEPATIC FUNCTION PANEL  ?URINALYSIS, ROUTINE W REFLEX MICROSCOPIC  ? ? ?EKG ?None ? ?Radiology ?No results found. ? ?Procedures ?Procedures  ? ?Medications Ordered in ED ?Medications - No data to display ? ?ED Course/ Medical Decision Making/ A&P ?  ?                        ?Medical Decision Making ?Amount and/or Complexity of Data Reviewed ?Labs: ordered. ? ?Risk ?Prescription drug management. ? ? ?81 y/o F with history of dementia presents emergency department for evaluation of not wanting to walk today.  EMS reports that patient had foul-smelling urine.  Differential diagnosis includes was not limited to worsening dementia, UTI, arthritis.  Vital signs show normotensive, afebrile, borderline bradycardic otherwise satting well on room air without any increased work of breathing.  Physical exam is pertinent for a pleasantly demented female patient.  She has a regular rate and rhythm.  Lungs are clear to auscultation.  Her abdomen is soft with normal active bowel sounds.  No tenderness.  She has chronic lower extremity edema bilaterally.  She is at baseline per husband.  ? ?66 of history obtained from husband over the phone.  ? ?I  independently reviewed and interpreted the patient's labs.  BMP shows mildly elevated chloride at 112.  Elevated BUN at 24 otherwise normal creatinine.  No other electrolyte abnormalities.  Decreased albumin at 3.3 otherwise normal LFTs.  CBC shows no leukocytosis.  Patient has chronic anemia at 10.7 which appears to be baseline.  Urinalysis shows hazy urine with 1 amount of hemoglobin.  She is nitrite and leukocyte positive.  There is many bacteria with greater than 50 white white blood cells seen.  Consistent with UTI.  Urine culture obtained. ? ?The patient was ambulatory around the room with  me and was able to get herself in and out of bed with minimal assistance. Unsure of why she did not want to walk for her husband earlier. Her sores are non tender. She has no tenderness over her legs or hips.  ? ?Vitals are stable, not concerned for any sepsis.  She is moving all extremities and is ambulatory with minimum assistance.  Reports baseline for patient. ? ?She was given 1 g IV Rocephin for UTI and will be sent home on 500 mg of Keflex twice daily for the next 7 days.  Being her sores on her bottom dry, changing position every 2 hours, and following up with her PCP.  I discussed the labs and discharge plan with the husband.  Return precautions and red flag symptoms were discussed.  Time was given for questions.  Patient's husband verbalizes understanding and agrees to plan.  Patient is stable and being discharged home in good condition. ? ?I discussed this case with my attending physician who cosigned this note including patient's presenting symptoms, physical exam, and planned diagnostics and interventions. Attending physician stated agreement with plan or made changes to plan which were implemented.  ? ?Attending physician assessed patient at bedside. ? ?Final Clinical Impression(s) / ED Diagnoses ?Final diagnoses:  ?Acute cystitis with hematuria  ? ? ?Rx / DC Orders ?ED Discharge Orders   ? ?      Ordered  ?   cephALEXin (KEFLEX) 500 MG capsule  2 times daily       ? 01/05/22 1717  ? ?  ?  ? ?  ? ? ?  ?Sherrell Puller, PA-C ?01/05/22 1734 ? ?  ?Sherrell Puller, PA-C ?01/05/22 1736 ? ?  ?Malvin Johns, MD ?01/05/22 1803 ? ?  ?

## 2022-01-05 NOTE — Discharge Instructions (Signed)
Your wife was seen in the emergency room today for evaluation.  It was found that she had a urinary tract infection.  For this, she will be given an antibiotic that she would need to take twice daily for the next 7 days.  Please follow-up with your primary care doctor for further evaluation of her small bedsore seen on her bottom.  Please make sure that you are keeping this area dry and that she is changing position at least every 2 hours unless she is sleeping.  If she has any fevers, nausea, vomiting, abdominal pain, starts acting different, please return to the nearest emergency department for evaluation. ? ?Contact a health care provider if: ?Your symptoms do not get better after 1-2 days. ?Your symptoms go away and then return. ?Get help right away if: ?You have severe pain in your back or your lower abdomen. ?You have a fever or chills. ?You have nausea or vomiting. ?

## 2022-01-05 NOTE — ED Notes (Signed)
[facility] called for transport.  

## 2022-01-05 NOTE — ED Notes (Signed)
Pt continues to try and get out of the bed, restless, pt is a fall risk, confused, not open to redirection from nursing staff, unable to follow simple directions. EDP made aware. Poisey belt ordered for pt safety ?

## 2022-01-05 NOTE — ED Triage Notes (Signed)
Pt to ED via EMS from home c/o UTI symptoms. Painful urination and urine odor. Pt also feels more generalized weakness than normal. TY:OMAYOKHT. Lives at home with husband. Last VS: 111/66, P62, 98%ra, cbg 106. No medications given by EMS.  ?

## 2022-01-05 NOTE — ED Notes (Signed)
PT finally resting ? ?

## 2022-01-05 NOTE — ED Notes (Signed)
Husband called to confirm awake and ready to receive pt.  ?

## 2022-01-07 ENCOUNTER — Other Ambulatory Visit: Payer: Self-pay | Admitting: Cardiology

## 2022-01-08 LAB — URINE CULTURE: Culture: >=100000 — AB

## 2022-01-09 ENCOUNTER — Telehealth: Payer: Self-pay

## 2022-01-09 NOTE — Telephone Encounter (Signed)
Post ED Visit - Positive Culture Follow-up ? ?Culture report reviewed by antimicrobial stewardship pharmacist: ?[facility] ?[]  , Pharm.D. ?[]  Enzo Bi, Pharm.D., BCPS AQ-ID ?[]  , Pharm.D., BCPS ?[]  Celedonio Miyamoto, Pharm.D., BCPS ?[]  [place], Garvin Fila.D., BCPS, AAHIVP ?[]  , Pharm.D., BCPS, AAHIVP ?[]  Georgina Pillion, PharmD, BCPS ?[]  , PharmD, BCPS ?[]  [place], PharmD, BCPS ?[]  [address], PharmD ?[]  , PharmD, BCPS ?[]  Estella Husk, PharmD ? ? [facility] ?[]  Lysle Pearl, PharmD ?[]  , PharmD ?[]  Phillips Climes, PharmD ?[]  , Rph ?[]  Agapito Games) , PharmD ?[]  Verlan Friends, PharmD ?[]  , PharmD ?[]  Mervyn Gay, PharmD ?[]  , PharmD ?[x]  Vinnie Level, PharmD ?[]  [facility], PharmD ?[]  , PharmD ?[]  Len Childs, PharmD ? ? ?Positive urine culture ?Treated with Cephalexin, organism sensitive to the same and no further patient follow-up is required at this time. ? ? ?01/09/2022, 9:53 AM ?  ?

## 2022-01-15 ENCOUNTER — Other Ambulatory Visit: Payer: Self-pay | Admitting: Cardiology

## 2022-04-24 ENCOUNTER — Emergency Department (HOSPITAL_COMMUNITY): Payer: Medicare PPO

## 2022-04-24 ENCOUNTER — Encounter (HOSPITAL_COMMUNITY): Payer: Self-pay

## 2022-04-24 ENCOUNTER — Other Ambulatory Visit: Payer: Self-pay

## 2022-04-24 ENCOUNTER — Emergency Department (HOSPITAL_COMMUNITY)
Admission: EM | Admit: 2022-04-24 | Discharge: 2022-04-24 | Disposition: A | Discharge status: Home / Self Care | Payer: Medicare PPO | Attending: Emergency Medicine | Admitting: Emergency Medicine

## 2022-04-24 DIAGNOSIS — R410 Disorientation, unspecified: Secondary | ICD-10-CM | POA: Diagnosis not present

## 2022-04-24 DIAGNOSIS — F0390 Unspecified dementia without behavioral disturbance: Secondary | ICD-10-CM | POA: Insufficient documentation

## 2022-04-24 DIAGNOSIS — R531 Weakness: Secondary | ICD-10-CM | POA: Diagnosis present

## 2022-04-24 DIAGNOSIS — Z7901 Long term (current) use of anticoagulants: Secondary | ICD-10-CM | POA: Diagnosis not present

## 2022-04-24 DIAGNOSIS — N390 Urinary tract infection, site not specified: Secondary | ICD-10-CM

## 2022-04-24 LAB — CBC WITH DIFFERENTIAL/PLATELET
Abs Immature Granulocytes: 0.01 10*3/uL (ref 0.00–0.07)
Basophils Absolute: 0 10*3/uL (ref 0.0–0.1)
Basophils Relative: 1 %
Eosinophils Absolute: 0 10*3/uL (ref 0.0–0.5)
Eosinophils Relative: 1 %
HCT: 33.2 % — ABNORMAL LOW (ref 36.0–46.0)
Hemoglobin: 10.4 g/dL — ABNORMAL LOW (ref 12.0–15.0)
Immature Granulocytes: 0 %
Lymphocytes Relative: 33 %
Lymphs Abs: 1.2 10*3/uL (ref 0.7–4.0)
MCH: 25.9 pg — ABNORMAL LOW (ref 26.0–34.0)
MCHC: 31.3 g/dL (ref 30.0–36.0)
MCV: 82.8 fL (ref 80.0–100.0)
Monocytes Absolute: 0.4 10*3/uL (ref 0.1–1.0)
Monocytes Relative: 10 %
Neutro Abs: 2 10*3/uL (ref 1.7–7.7)
Neutrophils Relative %: 55 %
Platelets: 184 10*3/uL (ref 150–400)
RBC: 4.01 MIL/uL (ref 3.87–5.11)
RDW: 16 % — ABNORMAL HIGH (ref 11.5–15.5)
WBC: 3.5 10*3/uL — ABNORMAL LOW (ref 4.0–10.5)
nRBC: 0 % (ref 0.0–0.2)

## 2022-04-24 LAB — URINALYSIS, ROUTINE W REFLEX MICROSCOPIC
Bilirubin Urine: NEGATIVE
Glucose, UA: NEGATIVE mg/dL
Ketones, ur: NEGATIVE mg/dL
Nitrite: POSITIVE — AB
Protein, ur: 30 mg/dL — AB
Specific Gravity, Urine: 1.028 (ref 1.005–1.030)
pH: 5 (ref 5.0–8.0)

## 2022-04-24 LAB — COMPREHENSIVE METABOLIC PANEL
ALT: 9 U/L (ref 0–44)
AST: 14 U/L — ABNORMAL LOW (ref 15–41)
Albumin: 3.2 g/dL — ABNORMAL LOW (ref 3.5–5.0)
Alkaline Phosphatase: 46 U/L (ref 38–126)
Anion gap: 6 (ref 5–15)
BUN: 16 mg/dL (ref 8–23)
CO2: 28 mmol/L (ref 22–32)
Calcium: 9.1 mg/dL (ref 8.9–10.3)
Chloride: 108 mmol/L (ref 98–111)
Creatinine, Ser: 0.99 mg/dL (ref 0.44–1.00)
GFR, Estimated: 58 mL/min — ABNORMAL LOW (ref >60)
Glucose, Bld: 94 mg/dL (ref 70–99)
Potassium: 3.6 mmol/L (ref 3.5–5.1)
Sodium: 142 mmol/L (ref 135–145)
Total Bilirubin: 0.7 mg/dL (ref 0.3–1.2)
Total Protein: 6.5 g/dL (ref 6.5–8.1)

## 2022-04-24 MED ORDER — CEFDINIR 300 MG PO CAPS: 300.0000 mg | ORAL_CAPSULE | Freq: Two times a day (BID) | ORAL | 0 refills | Status: AC

## 2022-04-24 NOTE — ED Triage Notes (Addendum)
Pt bib ems from home c/o "unspecified pain and decreased mobility" per husband. Hx dementia

## 2022-04-24 NOTE — ED Provider Notes (Signed)
[facility]-EMERGENCY DEPT Provider Note   CSN: [id number] Arrival date & time: 04/24/22  1804     History  Chief Complaint  Patient presents with   Weakness    Donna Simmons is a 81 y.o. female.  81 year old female with history of dementia on Eliquis presents with decreased mobility according to EMS.  Husband is on his way here and will provide more information when he gets here.  Patient reportedly has had no falls.  Has not been active as normal.  EMS called and patient transported here       Home Medications Prior to Admission medications   Medication Sig Start Date End Date Taking? Authorizing Provider  apixaban (ELIQUIS) 5 MG TABS tablet Take 1 tablet by mouth twice daily Patient taking differently: Take 5 mg by mouth 2 (two) times daily. 08/08/21   Marykay Lex, MD  donepezil (ARICEPT) 5 MG tablet Take 5 mg by mouth at bedtime. 07/22/21   [provider]  feeding supplement (ENSURE ENLIVE / ENSURE PLUS) LIQD Take 237 mLs by mouth 2 (two) times daily between meals. 12/07/21   Barnetta Chapel, MD  flecainide (TAMBOCOR) 50 MG tablet TAKE 1 TABLET BY MOUTH TWICE DAILY. APPOINTMENT REQUIRED FOR FUTURE REFILLS 01/15/22   Marykay Lex, MD  metoprolol succinate (TOPROL-XL) 50 MG 24 hr tablet TAKE 1 TABLET BY MOUTH ONCE DAILY WITH  OR  IMMEDIATELY  FOLLOWING  A  MEAL 01/07/22   Marykay Lex, MD  MYRBETRIQ 25 MG TB24 tablet Take 25 mg by mouth daily. 09/15/20   [provider]  omeprazole (PRILOSEC) 40 MG capsule Take 40 mg by mouth daily.    [provider]  rosuvastatin (CRESTOR) 20 MG tablet Take 1 tablet by mouth once daily Patient taking differently: Take 20 mg by mouth at bedtime. 08/08/20   Marykay Lex, MD  senna (SENOKOT) 8.6 MG TABS tablet Take 2 tablets (17.2 mg total) by mouth at bedtime. 12/07/21   Barnetta Chapel, MD      Allergies    Antihistamines, chlorpheniramine-type    Review of Systems   Review  of Systems  Unable to perform ROS: Mental status change    Physical Exam Updated Vital Signs BP 107/74   Pulse (!) 57   Temp 98 F (36.7 C)   Resp 16   Ht 1.6 m (5\' 3" )   Wt 54.4 kg   SpO2 96%   BMI 21.24 kg/m  Physical Exam Vitals and nursing note reviewed.  Constitutional:      General: She is not in acute distress.    Appearance: Normal appearance. She is well-developed. She is not toxic-appearing.  HENT:     Head: Normocephalic and atraumatic.  Eyes:     General: Lids are normal.     Conjunctiva/sclera: Conjunctivae normal.     Pupils: Pupils are equal, round, and reactive to light.  Neck:     Thyroid: No thyroid mass.     Trachea: No tracheal deviation.  Cardiovascular:     Rate and Rhythm: Normal rate and regular rhythm.     Heart sounds: Normal heart sounds. No murmur heard.    No gallop.  Pulmonary:     Effort: Pulmonary effort is normal. No respiratory distress.     Breath sounds: Normal breath sounds. No stridor. No decreased breath sounds, wheezing, rhonchi or rales.  Abdominal:     General: There is no distension.     Palpations: Abdomen  is soft.     Tenderness: There is no abdominal tenderness. There is no rebound.  Musculoskeletal:        General: No tenderness. Normal range of motion.     Cervical back: Normal range of motion and neck supple.  Skin:    General: Skin is warm and dry.     Findings: No abrasion or rash.  Neurological:     General: No focal deficit present.     Mental Status: She is alert. Mental status is at baseline. She is disoriented.     GCS: GCS eye subscore is 4. GCS verbal subscore is 4. GCS motor subscore is 6.     Cranial Nerves: No cranial nerve deficit.     Sensory: No sensory deficit.     Motor: Motor function is intact.  Psychiatric:        Attention and Perception: Attention normal.        Speech: Speech is delayed.        Behavior: Behavior is slowed.     ED Results / Procedures / Treatments   Labs (all labs  ordered are listed, but only abnormal results are displayed) Labs Reviewed  CBC WITH DIFFERENTIAL/PLATELET  COMPREHENSIVE METABOLIC PANEL  URINALYSIS, ROUTINE W REFLEX MICROSCOPIC    EKG None  Radiology No results found.  Procedures Procedures    Medications Ordered in ED Medications - No data to display  ED Course/ Medical Decision Making/ A&P                           Medical Decision Making Amount and/or Complexity of Data Reviewed Labs: ordered. Radiology: ordered.   Patient presented with weakness that was nonfocal in nature.  Patient is on a blood thinner and head CT performed and per my interpretation showed no signs of acute intracranial hemorrhage.  She has no evidence of leukocytosis on CBC.  She is afebrile.  Low suspicion for sepsis.  Her urinalysis does show evidence of infection here.  Will place on antibiotics.  Findings discussed with patient's husband and he requested patient be brought home by ambulance        Final Clinical Impression(s) / ED Diagnoses Final diagnoses:  None    Rx / DC Orders ED Discharge Orders     None         Lorre Nick, MD 04/24/22 2033

## 2022-06-30 ENCOUNTER — Encounter (HOSPITAL_COMMUNITY): Payer: Self-pay

## 2022-06-30 ENCOUNTER — Emergency Department (HOSPITAL_COMMUNITY): Payer: Medicare PPO

## 2022-06-30 ENCOUNTER — Inpatient Hospital Stay (HOSPITAL_COMMUNITY)
Admission: EM | Admit: 2022-06-30 | Discharge: 2022-07-08 | DRG: 689 | Disposition: A | Discharge status: Skilled Nursing Facility | Payer: Medicare PPO | Attending: Internal Medicine | Admitting: Internal Medicine

## 2022-06-30 DIAGNOSIS — R627 Adult failure to thrive: Secondary | ICD-10-CM | POA: Diagnosis present

## 2022-06-30 DIAGNOSIS — E876 Hypokalemia: Secondary | ICD-10-CM | POA: Diagnosis present

## 2022-06-30 DIAGNOSIS — G301 Alzheimer's disease with late onset: Secondary | ICD-10-CM | POA: Diagnosis present

## 2022-06-30 DIAGNOSIS — F0280 Dementia in other diseases classified elsewhere without behavioral disturbance: Secondary | ICD-10-CM | POA: Diagnosis present

## 2022-06-30 DIAGNOSIS — S8002XA Contusion of left knee, initial encounter: Secondary | ICD-10-CM | POA: Diagnosis present

## 2022-06-30 DIAGNOSIS — R531 Weakness: Secondary | ICD-10-CM | POA: Diagnosis present

## 2022-06-30 DIAGNOSIS — I4821 Permanent atrial fibrillation: Secondary | ICD-10-CM | POA: Diagnosis present

## 2022-06-30 DIAGNOSIS — E860 Dehydration: Secondary | ICD-10-CM | POA: Diagnosis present

## 2022-06-30 DIAGNOSIS — Z79899 Other long term (current) drug therapy: Secondary | ICD-10-CM

## 2022-06-30 DIAGNOSIS — Z1152 Encounter for screening for COVID-19: Secondary | ICD-10-CM | POA: Diagnosis not present

## 2022-06-30 DIAGNOSIS — Z8261 Family history of arthritis: Secondary | ICD-10-CM | POA: Diagnosis not present

## 2022-06-30 DIAGNOSIS — Z7901 Long term (current) use of anticoagulants: Secondary | ICD-10-CM

## 2022-06-30 DIAGNOSIS — Z90710 Acquired absence of both cervix and uterus: Secondary | ICD-10-CM | POA: Diagnosis not present

## 2022-06-30 DIAGNOSIS — N3000 Acute cystitis without hematuria: Secondary | ICD-10-CM

## 2022-06-30 DIAGNOSIS — I10 Essential (primary) hypertension: Secondary | ICD-10-CM | POA: Diagnosis present

## 2022-06-30 DIAGNOSIS — K219 Gastro-esophageal reflux disease without esophagitis: Secondary | ICD-10-CM | POA: Diagnosis present

## 2022-06-30 DIAGNOSIS — Z6820 Body mass index (BMI) 20.0-20.9, adult: Secondary | ICD-10-CM

## 2022-06-30 DIAGNOSIS — Z515 Encounter for palliative care: Secondary | ICD-10-CM

## 2022-06-30 DIAGNOSIS — N390 Urinary tract infection, site not specified: Secondary | ICD-10-CM | POA: Diagnosis present

## 2022-06-30 DIAGNOSIS — R296 Repeated falls: Secondary | ICD-10-CM | POA: Diagnosis present

## 2022-06-30 DIAGNOSIS — B9620 Unspecified Escherichia coli [E. coli] as the cause of diseases classified elsewhere: Secondary | ICD-10-CM | POA: Diagnosis present

## 2022-06-30 DIAGNOSIS — Y929 Unspecified place or not applicable: Secondary | ICD-10-CM | POA: Diagnosis not present

## 2022-06-30 DIAGNOSIS — Z8249 Family history of ischemic heart disease and other diseases of the circulatory system: Secondary | ICD-10-CM | POA: Diagnosis not present

## 2022-06-30 DIAGNOSIS — E43 Unspecified severe protein-calorie malnutrition: Secondary | ICD-10-CM | POA: Diagnosis present

## 2022-06-30 DIAGNOSIS — W1830XA Fall on same level, unspecified, initial encounter: Secondary | ICD-10-CM | POA: Diagnosis present

## 2022-06-30 DIAGNOSIS — L89152 Pressure ulcer of sacral region, stage 2: Secondary | ICD-10-CM | POA: Diagnosis present

## 2022-06-30 DIAGNOSIS — L8990 Pressure ulcer of unspecified site, unspecified stage: Secondary | ICD-10-CM | POA: Insufficient documentation

## 2022-06-30 DIAGNOSIS — E7800 Pure hypercholesterolemia, unspecified: Secondary | ICD-10-CM | POA: Diagnosis present

## 2022-06-30 DIAGNOSIS — Z888 Allergy status to other drugs, medicaments and biological substances status: Secondary | ICD-10-CM

## 2022-06-30 LAB — URINALYSIS, ROUTINE W REFLEX MICROSCOPIC
Bilirubin Urine: NEGATIVE
Glucose, UA: NEGATIVE mg/dL
Ketones, ur: 20 mg/dL — AB
Nitrite: POSITIVE — AB
Protein, ur: 30 mg/dL — AB
Specific Gravity, Urine: 1.025 (ref 1.005–1.030)
pH: 5 (ref 5.0–8.0)

## 2022-06-30 LAB — CBC WITH DIFFERENTIAL/PLATELET
Abs Immature Granulocytes: 0.02 10*3/uL (ref 0.00–0.07)
Basophils Absolute: 0 10*3/uL (ref 0.0–0.1)
Basophils Relative: 0 %
Eosinophils Absolute: 0 10*3/uL (ref 0.0–0.5)
Eosinophils Relative: 0 %
HCT: 38.4 % (ref 36.0–46.0)
Hemoglobin: 11.9 g/dL — ABNORMAL LOW (ref 12.0–15.0)
Immature Granulocytes: 0 %
Lymphocytes Relative: 7 %
Lymphs Abs: 0.5 10*3/uL — ABNORMAL LOW (ref 0.7–4.0)
MCH: 26.2 pg (ref 26.0–34.0)
MCHC: 31 g/dL (ref 30.0–36.0)
MCV: 84.6 fL (ref 80.0–100.0)
Monocytes Absolute: 0.3 10*3/uL (ref 0.1–1.0)
Monocytes Relative: 5 %
Neutro Abs: 6.6 10*3/uL (ref 1.7–7.7)
Neutrophils Relative %: 88 %
Platelets: 179 10*3/uL (ref 150–400)
RBC: 4.54 MIL/uL (ref 3.87–5.11)
RDW: 16.7 % — ABNORMAL HIGH (ref 11.5–15.5)
WBC: 7.5 10*3/uL (ref 4.0–10.5)
nRBC: 0 % (ref 0.0–0.2)

## 2022-06-30 LAB — COMPREHENSIVE METABOLIC PANEL
ALT: 25 U/L (ref 0–44)
AST: 77 U/L — ABNORMAL HIGH (ref 15–41)
Albumin: 3.3 g/dL — ABNORMAL LOW (ref 3.5–5.0)
Alkaline Phosphatase: 48 U/L (ref 38–126)
Anion gap: 8 (ref 5–15)
BUN: 15 mg/dL (ref 8–23)
CO2: 26 mmol/L (ref 22–32)
Calcium: 8.9 mg/dL (ref 8.9–10.3)
Chloride: 110 mmol/L (ref 98–111)
Creatinine, Ser: 0.72 mg/dL (ref 0.44–1.00)
GFR, Estimated: >60 mL/min (ref >60)
Glucose, Bld: 101 mg/dL — ABNORMAL HIGH (ref 70–99)
Potassium: 3.2 mmol/L — ABNORMAL LOW (ref 3.5–5.1)
Sodium: 144 mmol/L (ref 135–145)
Total Bilirubin: 1 mg/dL (ref 0.3–1.2)
Total Protein: 6.5 g/dL (ref 6.5–8.1)

## 2022-06-30 LAB — RESP PANEL BY RT-PCR (FLU A&B, COVID) ARPGX2
Influenza A by PCR: NEGATIVE
Influenza B by PCR: NEGATIVE
SARS Coronavirus 2 by RT PCR: NEGATIVE

## 2022-06-30 LAB — TROPONIN I (HIGH SENSITIVITY)
Troponin I (High Sensitivity): 10 ng/L (ref <18)
Troponin I (High Sensitivity): 12 ng/L (ref <18)

## 2022-06-30 LAB — LIPASE, BLOOD: Lipase: 24 U/L (ref 11–51)

## 2022-06-30 MED ORDER — SODIUM CHLORIDE 0.9 % IV SOLN
2.0000 g | INTRAVENOUS | Status: AC
  Administered 2022-07-01 – 2022-07-04 (×4): 2 g via INTRAVENOUS
  Filled 2022-06-30 (×4): qty 20

## 2022-06-30 MED ORDER — SODIUM CHLORIDE 0.9 % IV BOLUS
500.0000 mL | Freq: Once | INTRAVENOUS | Status: AC
  Administered 2022-06-30: 500 mL via INTRAVENOUS

## 2022-06-30 MED ORDER — SODIUM CHLORIDE 0.9 % IV SOLN
1.0000 g | Freq: Once | INTRAVENOUS | Status: AC
  Administered 2022-06-30: 1 g via INTRAVENOUS
  Filled 2022-06-30: qty 10

## 2022-06-30 NOTE — ED Provider Notes (Signed)
[facility] DEPT Provider Note   CSN: [id number] Arrival date & time: 06/30/22  1525     History  Chief Complaint  Patient presents with   Weakness    Donna Simmons is a 81 y.o. female.  Level 5 caveat due to dementia.  Patient here with husband/caregiver.  Patient with history of dementia, on Eliquis for A-fib history, history of hypertension high cholesterol.  She has had decline over the last several months worse over the last 24 to 48 hours.  She had a fall yesterday and having more difficulty walking than normal.  Does not use a walker or cane.  ADLs are basically all performed by husband and.  She is able to feed herself at times.  But he has been needing to feed her the last 24 hours.  She fell landing on her knees yesterday.  This was witnessed by husband.  She did not hit her head or lose consciousness.  Saw gerontology team few days ago who recommended getting some more support at home or maybe even placement at this time given that she has had significant decline in weight loss over the last several months.  Husband denies any recent illness.  Patient denies any pain.  She can tell me her name.  Husband states that she does have a little bruise on the left knee from fall yesterday.  The history is provided by the patient and a caregiver.       Home Medications Prior to Admission medications   Medication Sig Start Date End Date Taking? Authorizing Provider  apixaban (ELIQUIS) 5 MG TABS tablet Take 1 tablet by mouth twice daily Patient taking differently: Take 5 mg by mouth 2 (two) times daily. 08/08/21   Leonie Man, MD  donepezil (ARICEPT) 5 MG tablet Take 5 mg by mouth at bedtime. 07/22/21   [provider]  feeding supplement (ENSURE ENLIVE / ENSURE PLUS) LIQD Take 237 mLs by mouth 2 (two) times daily between meals. 12/07/21   Bonnell Public, MD  flecainide (TAMBOCOR) 50 MG tablet TAKE 1 TABLET BY MOUTH TWICE DAILY.  APPOINTMENT REQUIRED FOR FUTURE REFILLS 01/15/22   Leonie Man, MD  metoprolol succinate (TOPROL-XL) 50 MG 24 hr tablet TAKE 1 TABLET BY MOUTH ONCE DAILY WITH  OR  IMMEDIATELY  FOLLOWING  A  MEAL 01/07/22   Leonie Man, MD  MYRBETRIQ 25 MG TB24 tablet Take 25 mg by mouth daily. 09/15/20   [provider]  omeprazole (PRILOSEC) 40 MG capsule Take 40 mg by mouth daily.    [provider]  rosuvastatin (CRESTOR) 20 MG tablet Take 1 tablet by mouth once daily Patient taking differently: Take 20 mg by mouth at bedtime. 08/08/20   Leonie Man, MD  senna (SENOKOT) 8.6 MG TABS tablet Take 2 tablets (17.2 mg total) by mouth at bedtime. 12/07/21   Bonnell Public, MD      Allergies    Antihistamines, chlorpheniramine-type    Review of Systems   Review of Systems  Physical Exam Updated Vital Signs BP (!) 137/96   Pulse 92   Temp 98.3 F (36.8 C) (Oral)   Resp 17   SpO2 99%  Physical Exam Vitals and nursing note reviewed.  Constitutional:      General: She is not in acute distress.    Appearance: She is well-developed. She is ill-appearing.     Comments: Chronically ill-appearing, very thin  HENT:     Head:  Normocephalic and atraumatic.     Mouth/Throat:     Mouth: Mucous membranes are moist.  Eyes:     Extraocular Movements: Extraocular movements intact.     Conjunctiva/sclera: Conjunctivae normal.     Pupils: Pupils are equal, round, and reactive to light.  Cardiovascular:     Rate and Rhythm: Normal rate and regular rhythm.     Pulses: Normal pulses.     Heart sounds: Normal heart sounds. No murmur heard. Pulmonary:     Effort: Pulmonary effort is normal. No respiratory distress.     Breath sounds: Normal breath sounds.  Abdominal:     Palpations: Abdomen is soft.     Tenderness: There is no abdominal tenderness.  Musculoskeletal:        General: No swelling.     Cervical back: Normal range of motion and neck supple.     Comments: Dependent edema  in her legs bilaterally  Skin:    General: Skin is warm and dry.     Capillary Refill: Capillary refill takes less than 2 seconds.  Neurological:     General: No focal deficit present.     Mental Status: She is alert.     Comments: Moves all extremities, follows commands  Psychiatric:        Mood and Affect: Mood normal.     ED Results / Procedures / Treatments   Labs (all labs ordered are listed, but only abnormal results are displayed) Labs Reviewed  CBC WITH DIFFERENTIAL/PLATELET - Abnormal; Notable for the following components:      Result Value   Hemoglobin 11.9 (*)    RDW 16.7 (*)    Lymphs Abs 0.5 (*)    All other components within normal limits  URINALYSIS, ROUTINE W REFLEX MICROSCOPIC - Abnormal; Notable for the following components:   APPearance HAZY (*)    Hgb urine dipstick LARGE (*)    Ketones, ur 20 (*)    Protein, ur 30 (*)    Nitrite POSITIVE (*)    Leukocytes,Ua SMALL (*)    Bacteria, UA MANY (*)    All other components within normal limits  COMPREHENSIVE METABOLIC PANEL - Abnormal; Notable for the following components:   Potassium 3.2 (*)    Glucose, Bld 101 (*)    Albumin 3.3 (*)    AST 77 (*)    All other components within normal limits  RESP PANEL BY RT-PCR (FLU A&B, COVID) [id number]  URINE CULTURE  COMPREHENSIVE METABOLIC PANEL  LIPASE, BLOOD  LIPASE, BLOOD  TROPONIN I (HIGH SENSITIVITY)  TROPONIN I (HIGH SENSITIVITY)    EKG EKG Interpretation  Date/Time:  Monday June 30 2022 18:46:18 EDT Ventricular Rate:  68 PR Interval:    QRS Duration: 106 QT Interval:  409 QTC Calculation: 435 R Axis:   -21 Text Interpretation: Atrial fibrillation Left ventricular hypertrophy Confirmed by Lennice Sites ([id number]) on 06/30/2022 6:58:45 PM  Radiology DG Knee Complete 4 Views Left  Result Date: 06/30/2022 CLINICAL DATA:  Fall, weakness EXAM: LEFT KNEE - COMPLETE 4+ VIEW COMPARISON:  04/26/2018 FINDINGS: No evidence of fracture, dislocation, or joint  effusion. Mild tricompartmental osteoarthritis, most pronounced within the medial compartment. Soft tissues are unremarkable. IMPRESSION: Negative. Electronically Signed   By: Davina Poke D.O.   On: 06/30/2022 19:50   DG Knee Complete 4 Views Right  Result Date: 06/30/2022 CLINICAL DATA:  Fall, weakness EXAM: RIGHT KNEE - COMPLETE 4+ VIEW COMPARISON:  None Available. FINDINGS: The knee is substantially flexed on imaging,  reportedly the patient is contracted and straightening the knee was not feasible. This results in nonstandard orientation and some reduction in diagnostic sensitivity and specificity. No fracture or acute bony findings. No definite knee effusion. Mild marginal spurring of the patella. IMPRESSION: 1. No fracture or acute bony findings identified. 2. Mild marginal spurring of the patella. 3. Nonstandard positioning due to patient contracture. Electronically Signed   By: Van Clines M.D.   On: 06/30/2022 19:49   DG Pelvis 1-2 Views  Result Date: 06/30/2022 CLINICAL DATA:  Fall. EXAM: PELVIS - 1-2 VIEW COMPARISON:  Pelvis x-ray 08/06/2021 FINDINGS: There is no evidence of pelvic fracture or diastasis. No pelvic bone lesions are seen. Soft tissues are within normal limits. IMPRESSION: No fracture or dislocation. Electronically Signed   By: Ronney Asters M.D.   On: 06/30/2022 19:47   DG Chest 2 View  Result Date: 06/30/2022 CLINICAL DATA:  Fall EXAM: CHEST - 2 VIEW COMPARISON:  Chest x-ray 12/03/2021 FINDINGS: Patient's chin overlies the left lung apex which limits evaluation. Cardiomediastinal silhouette is within normal limits. There is some minimal patchy opacities in the left lung base. There is no pleural effusion. There is no large pneumothorax. No acute fractures are seen. IMPRESSION: Limited evaluation. Minimal patchy opacities in the left lung base could represent atelectasis or infection. Electronically Signed   By: Ronney Asters M.D.   On: 06/30/2022 19:45   CT HEAD  WO CONTRAST (5MM)  Result Date: 06/30/2022 CLINICAL DATA:  Unable to walk/ambulate. EXAM: CT HEAD WITHOUT CONTRAST CT CERVICAL SPINE WITHOUT CONTRAST TECHNIQUE: Multidetector CT imaging of the head and cervical spine was performed following the standard protocol without intravenous contrast. Multiplanar CT image reconstructions of the cervical spine were also generated. RADIATION DOSE REDUCTION: This exam was performed according to the departmental dose-optimization program which includes automated exposure control, adjustment of the mA and/or kV according to patient size and/or use of iterative reconstruction technique. COMPARISON:  04/24/2022 FINDINGS: CT HEAD FINDINGS Brain: Nonstandard positioning resulting in somewhat distorted images. This lowers diagnostic sensitivity and specificity. Stable ex vacuo prominence of the lateral ventricles. Periventricular white matter and corona radiata hypodensities favor chronic ischemic microvascular white matter disease. Otherwise, the brainstem, cerebellum, cerebral peduncles, thalamus, basal ganglia, basilar cisterns, and ventricular system appear within normal limits. No intracranial hemorrhage, mass lesion, or acute CVA. Vascular: Unremarkable Skull: Unremarkable Sinuses/Orbits: Chronic ethmoid and maxillary sinusitis. Other: No supplemental non-categorized findings. CT CERVICAL SPINE FINDINGS Alignment: The neck is substantially flexed. No significant abnormal subluxation. Skull base and vertebrae: Dental cavities noted as on image 59 series 9. No cervical spine fracture or acute bony findings identified. Soft tissues and spinal canal: Unremarkable Disc levels:  No osseous impingement identified. Upper chest: Biapical pleuroparenchymal scarring. Other: No supplemental non-categorized findings. IMPRESSION: 1. No acute intracranial findings or acute cervical spine findings. 2. Periventricular white matter and corona radiata hypodensities favor chronic ischemic  microvascular white matter disease. 3. Substantial flexion of the cervical spine, query antecollis. 4. Chronic ethmoid and maxillary sinusitis. 5. Dental cavities. Electronically Signed   By: Van Clines M.D.   On: 06/30/2022 19:33   CT Cervical Spine Wo Contrast  Result Date: 06/30/2022 CLINICAL DATA:  Unable to walk/ambulate. EXAM: CT HEAD WITHOUT CONTRAST CT CERVICAL SPINE WITHOUT CONTRAST TECHNIQUE: Multidetector CT imaging of the head and cervical spine was performed following the standard protocol without intravenous contrast. Multiplanar CT image reconstructions of the cervical spine were also generated. RADIATION DOSE REDUCTION: This exam was performed according to  the departmental dose-optimization program which includes automated exposure control, adjustment of the mA and/or kV according to patient size and/or use of iterative reconstruction technique. COMPARISON:  04/24/2022 FINDINGS: CT HEAD FINDINGS Brain: Nonstandard positioning resulting in somewhat distorted images. This lowers diagnostic sensitivity and specificity. Stable ex vacuo prominence of the lateral ventricles. Periventricular white matter and corona radiata hypodensities favor chronic ischemic microvascular white matter disease. Otherwise, the brainstem, cerebellum, cerebral peduncles, thalamus, basal ganglia, basilar cisterns, and ventricular system appear within normal limits. No intracranial hemorrhage, mass lesion, or acute CVA. Vascular: Unremarkable Skull: Unremarkable Sinuses/Orbits: Chronic ethmoid and maxillary sinusitis. Other: No supplemental non-categorized findings. CT CERVICAL SPINE FINDINGS Alignment: The neck is substantially flexed. No significant abnormal subluxation. Skull base and vertebrae: Dental cavities noted as on image 59 series 9. No cervical spine fracture or acute bony findings identified. Soft tissues and spinal canal: Unremarkable Disc levels:  No osseous impingement identified. Upper chest:  Biapical pleuroparenchymal scarring. Other: No supplemental non-categorized findings. IMPRESSION: 1. No acute intracranial findings or acute cervical spine findings. 2. Periventricular white matter and corona radiata hypodensities favor chronic ischemic microvascular white matter disease. 3. Substantial flexion of the cervical spine, query antecollis. 4. Chronic ethmoid and maxillary sinusitis. 5. Dental cavities. Electronically Signed   By: Van Clines M.D.   On: 06/30/2022 19:33    Procedures Procedures    Medications Ordered in ED Medications  cefTRIAXone (ROCEPHIN) 1 g in sodium chloride 0.9 % 100 mL IVPB (has no administration in time range)  sodium chloride 0.9 % bolus 500 mL (has no administration in time range)    ED Course/ Medical Decision Making/ A&P                           Medical Decision Making Amount and/or Complexity of Data Reviewed Labs: ordered. Radiology: ordered.  Risk Decision regarding hospitalization.   [name] is here with failure to thrive.  History of dementia.  Normal vitals.  No fever.  Has been on a decline in the last several months but worse over the last day or 2 now.  She had a fall yesterday and now family unable to really help her at all.  She is on Eliquis for A-fib.  Caregiver is her husband who takes care of her 24/7.  He does not have any home health or any other support taking care of her.  At this time he feels like he is unable to safely take care of her.  She was seen by her primary doctor couple days ago and they were trying to work on maybe getting her placed given her declining weight loss here over the last few months.  Patient overall had a fall yesterday per husband.  Did not hit her head or lose consciousness.  Mostly landed on her knees.  She has an abrasion to her left knee.  She is pleasantly demented.  She can tell me her name.  She follows commands.  Her vital signs are normal.  She has no fever.  Overall we will do  broad work-up to evaluate for infectious process versus electrolyte issue versus dehydration versus traumatic processes.  We will get a CBC, EKG, troponin, CMP, CT head, x-ray of her chest and pelvis, CT scan of her neck.  I will talk with social work and case management.  We will have PT and OT evaluation.  If work-up does not show any acute process will board and have  her placed in the ED or possibly home health.  Per my review and interpretation of labs patient with UTI.  Cardiac enzymes normal.  CT scan of the head and neck are unremarkable.  Extremity x-rays are unremarkable per my review and interpretation.  She has no significant electrolyte abnormality, kidney injury or leukocytosis.  Overall she has failure to thrive symptoms in the setting of UTI.  Will admit for IV antibiotics and hydration and PT and OT and likely placement.  This chart was dictated using voice recognition software.  Despite best efforts to proofread,  errors can occur which can change the documentation meaning.         Final Clinical Impression(s) / ED Diagnoses Final diagnoses:  Failure to thrive in adult  Acute cystitis without hematuria    Rx / DC Orders ED Discharge Orders     None         Lennice Sites, DO 06/30/22 [id number]

## 2022-06-30 NOTE — ED Notes (Signed)
Lt green and Dark green collected and sent to the lab.

## 2022-06-30 NOTE — ED Triage Notes (Signed)
Pt arrived via POV with family. Family states pt all of the sudden is unable to walk correctly, has been walking bent over for days, unable to ambulate much at all. Has been unable to get medications due to cooperation. Family states it has been several days since they have been able to get her to take her meds.

## 2022-06-30 NOTE — Progress Notes (Addendum)
Transition of Care [facility]) - Emergency Department Mini Assessment   Patient Details  Name: Donna Simmons MRN: [id number] Date of Birth: [birth year]  [facility] CM/SW Contact:    Rodney Booze, LCSW Phone Number: 06/30/2022, 5:25 PM   Clinical Narrative: CSW went to speak to the Pt husband, husband states wife can not move around all of a sudden and is having a hard time. CSW asked the husband what kind of supports he needed for him and his wife. TOC will follow up.     ED Mini Assessment: What brought you to the Emergency Department? : (P) Pt is not eating, not walking                Patient Contact and Communications     Spoke with: (P) Husband Contact Date: (P) 06/30/22,                 Admission diagnosis:  Inability to Walk with LOA Patient Active Problem List   Diagnosis Date Noted   Weakness 12/03/2021   GERD (gastroesophageal reflux disease)    Generalized weakness 10/14/2021   Bilateral lower extremity edema 08/19/2020   Other forms of angina pectoris 07/25/2018   Long term current use of anticoagulant therapy 11/18/2012   Hypercholesteremia 09/25/2012   DDD (degenerative disc disease), cervical 05/19/2012   Paroxysmal atrial fibrillation (HCC) - CHA2DS2-VASc Score 3, on Eliquis 04/20/2012   Essential hypertension 04/20/2012   PCP:  [name], DO Pharmacy:   [place], [place] - [address] [place] [address] Phone: [phone] Fax: [phone]

## 2022-06-30 NOTE — ED Notes (Signed)
Bed alarm placed under Pt. °

## 2022-07-01 DIAGNOSIS — R627 Adult failure to thrive: Principal | ICD-10-CM

## 2022-07-01 DIAGNOSIS — R531 Weakness: Secondary | ICD-10-CM

## 2022-07-01 DIAGNOSIS — N3000 Acute cystitis without hematuria: Secondary | ICD-10-CM

## 2022-07-01 LAB — COMPREHENSIVE METABOLIC PANEL
ALT: 23 U/L (ref 0–44)
AST: 65 U/L — ABNORMAL HIGH (ref 15–41)
Albumin: 2.8 g/dL — ABNORMAL LOW (ref 3.5–5.0)
Alkaline Phosphatase: 40 U/L (ref 38–126)
Anion gap: 9 (ref 5–15)
BUN: 12 mg/dL (ref 8–23)
CO2: 24 mmol/L (ref 22–32)
Calcium: 8.3 mg/dL — ABNORMAL LOW (ref 8.9–10.3)
Chloride: 111 mmol/L (ref 98–111)
Creatinine, Ser: 0.7 mg/dL (ref 0.44–1.00)
GFR, Estimated: >60 mL/min (ref >60)
Glucose, Bld: 88 mg/dL (ref 70–99)
Potassium: 3.1 mmol/L — ABNORMAL LOW (ref 3.5–5.1)
Sodium: 144 mmol/L (ref 135–145)
Total Bilirubin: 1 mg/dL (ref 0.3–1.2)
Total Protein: 5.6 g/dL — ABNORMAL LOW (ref 6.5–8.1)

## 2022-07-01 LAB — CBC WITH DIFFERENTIAL/PLATELET
Abs Immature Granulocytes: 0.02 10*3/uL (ref 0.00–0.07)
Basophils Absolute: 0 10*3/uL (ref 0.0–0.1)
Basophils Relative: 0 %
Eosinophils Absolute: 0 10*3/uL (ref 0.0–0.5)
Eosinophils Relative: 0 %
HCT: 32.7 % — ABNORMAL LOW (ref 36.0–46.0)
Hemoglobin: 10.2 g/dL — ABNORMAL LOW (ref 12.0–15.0)
Immature Granulocytes: 0 %
Lymphocytes Relative: 16 %
Lymphs Abs: 0.9 10*3/uL (ref 0.7–4.0)
MCH: 25.6 pg — ABNORMAL LOW (ref 26.0–34.0)
MCHC: 31.2 g/dL (ref 30.0–36.0)
MCV: 82.2 fL (ref 80.0–100.0)
Monocytes Absolute: 0.4 10*3/uL (ref 0.1–1.0)
Monocytes Relative: 6 %
Neutro Abs: 4.7 10*3/uL (ref 1.7–7.7)
Neutrophils Relative %: 78 %
Platelets: 174 10*3/uL (ref 150–400)
RBC: 3.98 MIL/uL (ref 3.87–5.11)
RDW: 16.3 % — ABNORMAL HIGH (ref 11.5–15.5)
WBC: 6 10*3/uL (ref 4.0–10.5)
nRBC: 0 % (ref 0.0–0.2)

## 2022-07-01 LAB — MAGNESIUM: Magnesium: 1.9 mg/dL (ref 1.7–2.4)

## 2022-07-01 LAB — PHOSPHORUS: Phosphorus: 3.2 mg/dL (ref 2.5–4.6)

## 2022-07-01 MED ORDER — MELATONIN 5 MG PO TABS
5.0000 mg | ORAL_TABLET | Freq: Every evening | ORAL | Status: DC | PRN
  Administered 2022-07-03 – 2022-07-05 (×3): 5 mg via ORAL
  Filled 2022-07-01 (×3): qty 1

## 2022-07-01 MED ORDER — POTASSIUM CHLORIDE CRYS ER 20 MEQ PO TBCR
40.0000 meq | EXTENDED_RELEASE_TABLET | Freq: Once | ORAL | Status: AC
  Administered 2022-07-01: 40 meq via ORAL
  Filled 2022-07-01: qty 2

## 2022-07-01 MED ORDER — ACETAMINOPHEN 325 MG PO TABS
650.0000 mg | ORAL_TABLET | Freq: Four times a day (QID) | ORAL | Status: DC | PRN
  Administered 2022-07-02 – 2022-07-06 (×4): 650 mg via ORAL
  Filled 2022-07-01 (×4): qty 2

## 2022-07-01 MED ORDER — PANTOPRAZOLE SODIUM 40 MG PO TBEC
40.0000 mg | DELAYED_RELEASE_TABLET | Freq: Every day | ORAL | Status: DC
  Administered 2022-07-01 – 2022-07-06 (×6): 40 mg via ORAL
  Filled 2022-07-01 (×7): qty 1

## 2022-07-01 MED ORDER — PROCHLORPERAZINE EDISYLATE 10 MG/2ML IJ SOLN: 5.0000 mg | Freq: Four times a day (QID) | INTRAMUSCULAR | Status: DC | PRN

## 2022-07-01 MED ORDER — ENSURE ENLIVE PO LIQD
237.0000 mL | Freq: Two times a day (BID) | ORAL | Status: DC
  Administered 2022-07-01 – 2022-07-08 (×13): 237 mL via ORAL
  Filled 2022-07-01 (×2): qty 237

## 2022-07-01 MED ORDER — METOPROLOL SUCCINATE ER 25 MG PO TB24
12.5000 mg | ORAL_TABLET | Freq: Every day | ORAL | Status: DC
  Administered 2022-07-01 – 2022-07-08 (×8): 12.5 mg via ORAL
  Filled 2022-07-01 (×8): qty 1

## 2022-07-01 MED ORDER — ROSUVASTATIN CALCIUM 20 MG PO TABS: 20.0000 mg | ORAL_TABLET | Freq: Every day | ORAL | Status: DC

## 2022-07-01 MED ORDER — APIXABAN 5 MG PO TABS
5.0000 mg | ORAL_TABLET | Freq: Two times a day (BID) | ORAL | Status: DC
  Administered 2022-07-01 – 2022-07-08 (×14): 5 mg via ORAL
  Filled 2022-07-01 (×14): qty 1

## 2022-07-01 MED ORDER — POTASSIUM CHLORIDE 2 MEQ/ML IV SOLN
INTRAVENOUS | Status: AC
  Filled 2022-07-01 (×5): qty 1000

## 2022-07-01 MED ORDER — ENOXAPARIN SODIUM 40 MG/0.4ML IJ SOSY: 40.0000 mg | PREFILLED_SYRINGE | INTRAMUSCULAR | Status: DC

## 2022-07-01 MED ORDER — POLYETHYLENE GLYCOL 3350 17 G PO PACK
17.0000 g | PACK | Freq: Every day | ORAL | Status: DC | PRN
  Administered 2022-07-02: 17 g via ORAL
  Filled 2022-07-01: qty 1

## 2022-07-01 NOTE — Plan of Care (Signed)
Patient seen and rounded on this morning in the ER.  Patient admitted after midnight, see H&P for full A&P. Ms. Audia is a 81 year old female with PMH Alzheimer's dementia with behavioral disturbance, permanent A-fib, HTN, HLD who presented with worsening weakness and falls over the past approximately 2 months.  She underwent work-up in the ER which was notable for urinalysis consistent with UTI and she was started on antibiotics. PT was consulted regarding her falls and weakness.  Plan: - continue rocephin, follow up cultures - follow up PT eval: SNF recommended - continue toprol and Eliquis; continue tele -if no significant improvement, would consider inpatient palliative care consult  Dwyane Dee, MD [facility] 07/01/2022, 1:37 PM

## 2022-07-01 NOTE — H&P (Signed)
History and Physical  Donna Simmons SRP:[id number] DOB: [birth year] DOA: 06/30/2022  Referring physician: Dr. Ronnald Nian, [facility]  PCP: [name], DO  Outpatient Specialists: Hematology, cardiology. Patient coming from: Home.  Chief Complaint: Failure to thrive, frequent falls.  HPI: Donna Simmons is a 81 y.o. female with medical history significant for late onset Alzheimer's dementia with behavioral disturbance, followed by gerontology, permanent atrial fibrillation on Eliquis, essential hypertension, hyperlipidemia, who presented from home due to failure to thrive and frequent falls for the past 2 months.  Family endorses poor oral intake, generalized weakness, and progressive difficulty with ambulation.  [place] the day prior to presentation, landing on her knees.  In the ED, imaging were negative for fracture.  She was noted to be dehydrated with electrolytes abnormalities.  She received IV fluid hydration normal saline 500 cc/h x 1.  UA was positive for pyuria and empiric Rocephin was initiated for presented UTI.  Due to failure to thrive and recurrent falls, EDP requested admission.  The patient was admitted by [facility], hospitalist service.  ED Course: Tmax 99.2.  BP 126/73, pulse 57, respiratory rate 13, O2 saturation 100% on room air.  Lab studies remarkable for hemoglobin 11.9, serum potassium 3.2.  Review of Systems: Review of systems as noted in the HPI. All other systems reviewed and are negative.   Past Medical History:  Diagnosis Date   Allergy    GERD (gastroesophageal reflux disease)    Heart murmur    No significant valvular lesion noted on echo.   Hypercholesteremia    Hypertension    Memory loss    Paroxysmal atrial fibrillation ([place]) 12/27/2008   Past Surgical History:  Procedure Laterality Date   ABDOMINAL HYSTERECTOMY  1980   BREAST SURGERY     CT CTA CORONARY W/CA SCORE W/CM &/OR WO/CM  08/2018   Coronary calcium score 24.  Nonobstructive CAD with less than 30%  plaque in proximal LAD.  Normal aortic root. ->  Relook in October 2020 had significant motion artifact but coronary calcium score 26.   EYE SURGERY Right 12/29/2016   EYE SURGERY Left 01/19/2017   FRACTURE SURGERY     LEFT HEART CATH AND CORONARY ANGIOGRAPHY  12/16/2010   no evidence of CAD to explain anginal pain w/ positive troponin.  potential etiology is breakthrough AF   NM MYOVIEW LTD  April 2010   EF 64%, normal pattern of perfusion in all regions, no scintigraphic evidence of inducible ischemia; no significant wall motion abnormalities; EKG negative for ischemia; no significant change from last study; low risk scan   TRANSTHORACIC ECHOCARDIOGRAM  07/2018   Normal EF 55-60%.  No RWMA. GR 1 DD. Ao Sclerosis - no Stenosis. Mild AI. Trivial MR.     Social History:  reports that she has never smoked. She has never used smokeless tobacco. She reports that she does not drink alcohol and does not use drugs.   Allergies  Allergen Reactions   Antihistamines, Chlorpheniramine-Type Palpitations    Makes heart beat fast    Family History  Problem Relation Age of Onset   Arthritis Mother        OSTEO   Hypertension Mother    Dementia Mother    Heart disease Father    Hypertension Sister    Dementia Sister    Cancer - Lung Brother    Cancer Maternal Grandmother    Heart disease Maternal Grandfather    Cancer - Lung Brother    Hypertension Brother  Cancer - Prostate Brother       Prior to Admission medications   Medication Sig Start Date End Date Taking? Authorizing Provider  apixaban (ELIQUIS) 5 MG TABS tablet Take 1 tablet by mouth twice daily Patient taking differently: Take 5 mg by mouth 2 (two) times daily. 08/08/21   Marykay Lex, MD  donepezil (ARICEPT) 5 MG tablet Take 5 mg by mouth at bedtime. 07/22/21   [provider]  feeding supplement (ENSURE ENLIVE / ENSURE PLUS) LIQD Take 237 mLs by mouth 2 (two) times daily between meals. 12/07/21   Barnetta Chapel, MD  flecainide (TAMBOCOR) 50 MG tablet TAKE 1 TABLET BY MOUTH TWICE DAILY. APPOINTMENT REQUIRED FOR FUTURE REFILLS 01/15/22   Marykay Lex, MD  metoprolol succinate (TOPROL-XL) 50 MG 24 hr tablet TAKE 1 TABLET BY MOUTH ONCE DAILY WITH  OR  IMMEDIATELY  FOLLOWING  A  MEAL 01/07/22   Marykay Lex, MD  MYRBETRIQ 25 MG TB24 tablet Take 25 mg by mouth daily. 09/15/20   [provider]  omeprazole (PRILOSEC) 40 MG capsule Take 40 mg by mouth daily.    [provider]  rosuvastatin (CRESTOR) 20 MG tablet Take 1 tablet by mouth once daily Patient taking differently: Take 20 mg by mouth at bedtime. 08/08/20   Marykay Lex, MD  senna (SENOKOT) 8.6 MG TABS tablet Take 2 tablets (17.2 mg total) by mouth at bedtime. 12/07/21   Barnetta Chapel, MD    Physical Exam: BP 137/73   Pulse 64   Temp 97.6 F (36.4 C) (Oral)   Resp 11   SpO2 100%   General: 81 y.o. year-old female frail-appearing.  Alert and confused in the setting of dementia. Cardiovascular: Regular rate and rhythm with no rubs or gallops.  No thyromegaly or JVD noted.  No lower extremity edema. 2/4 pulses in all 4 extremities. Respiratory: Clear to auscultation with no wheezes or rales. Good inspiratory effort. Abdomen: Soft nontender nondistended with normal bowel sounds x4 quadrants. Muskuloskeletal: No cyanosis, clubbing or edema noted bilaterally Neuro: CN II-XII intact, strength, sensation, reflexes Skin: No ulcerative lesions noted or rashes Psychiatry: Judgement and insight appear altered. Mood is appropriate for condition and setting          Labs on Admission:  Basic Metabolic Panel: Recent Labs  Lab 06/30/22 1838 06/30/22 2024  NA SPECIMEN GROSSLY HEMOLYZED, REORDER PLACED BY LAB 144  K SPECIMEN GROSSLY HEMOLYZED, REORDER PLACED BY LAB 3.2*  CL SPECIMEN GROSSLY HEMOLYZED, REORDER PLACED BY LAB 110  CO2 SPECIMEN GROSSLY HEMOLYZED, REORDER PLACED BY LAB 26  GLUCOSE SPECIMEN GROSSLY HEMOLYZED,  REORDER PLACED BY LAB 101*  BUN SPECIMEN GROSSLY HEMOLYZED, REORDER PLACED BY LAB 15  CREATININE SPECIMEN GROSSLY HEMOLYZED, REORDER PLACED BY LAB 0.72  CALCIUM SPECIMEN GROSSLY HEMOLYZED, REORDER PLACED BY LAB 8.9   Liver Function Tests: Recent Labs  Lab 06/30/22 1838 06/30/22 2024  AST SPECIMEN GROSSLY HEMOLYZED, REORDER PLACED BY LAB 77*  ALT SPECIMEN GROSSLY HEMOLYZED, REORDER PLACED BY LAB 25  ALKPHOS SPECIMEN GROSSLY HEMOLYZED, REORDER PLACED BY LAB 48  BILITOT SPECIMEN GROSSLY HEMOLYZED, REORDER PLACED BY LAB 1.0  PROT SPECIMEN GROSSLY HEMOLYZED, REORDER PLACED BY LAB 6.5  ALBUMIN SPECIMEN GROSSLY HEMOLYZED, REORDER PLACED BY LAB 3.3*   Recent Labs  Lab 06/30/22 1838 06/30/22 2024  LIPASE SPECIMEN GROSSLY HEMOLYZED, REORDER PLACED BY LAB 24   No results for input(s): "AMMONIA" in the last 168 hours. CBC: Recent Labs  Lab 06/30/22 1838  WBC 7.5  NEUTROABS 6.6  HGB 11.9*  HCT 38.4  MCV 84.6  PLT 179   Cardiac Enzymes: No results for input(s): "CKTOTAL", "CKMB", "CKMBINDEX", "TROPONINI" in the last 168 hours.  BNP (last 3 results) No results for input(s): "BNP" in the last 8760 hours.  ProBNP (last 3 results) No results for input(s): "PROBNP" in the last 8760 hours.  CBG: No results for input(s): "GLUCAP" in the last 168 hours.  Radiological Exams on Admission: DG Knee Complete 4 Views Left  Result Date: 06/30/2022 CLINICAL DATA:  Fall, weakness EXAM: LEFT KNEE - COMPLETE 4+ VIEW COMPARISON:  04/26/2018 FINDINGS: No evidence of fracture, dislocation, or joint effusion. Mild tricompartmental osteoarthritis, most pronounced within the medial compartment. Soft tissues are unremarkable. IMPRESSION: Negative. Electronically Signed   By: Duanne Guess D.O.   On: 06/30/2022 19:50   DG Knee Complete 4 Views Right  Result Date: 06/30/2022 CLINICAL DATA:  Fall, weakness EXAM: RIGHT KNEE - COMPLETE 4+ VIEW COMPARISON:  None Available. FINDINGS: The knee is  substantially flexed on imaging, reportedly the patient is contracted and straightening the knee was not feasible. This results in nonstandard orientation and some reduction in diagnostic sensitivity and specificity. No fracture or acute bony findings. No definite knee effusion. Mild marginal spurring of the patella. IMPRESSION: 1. No fracture or acute bony findings identified. 2. Mild marginal spurring of the patella. 3. Nonstandard positioning due to patient contracture. Electronically Signed   By: Gaylyn Rong M.D.   On: 06/30/2022 19:49   DG Pelvis 1-2 Views  Result Date: 06/30/2022 CLINICAL DATA:  Fall. EXAM: PELVIS - 1-2 VIEW COMPARISON:  Pelvis x-ray 08/06/2021 FINDINGS: There is no evidence of pelvic fracture or diastasis. No pelvic bone lesions are seen. Soft tissues are within normal limits. IMPRESSION: No fracture or dislocation. Electronically Signed   By: Darliss Cheney M.D.   On: 06/30/2022 19:47   DG Chest 2 View  Result Date: 06/30/2022 CLINICAL DATA:  Fall EXAM: CHEST - 2 VIEW COMPARISON:  Chest x-ray 12/03/2021 FINDINGS: Patient's chin overlies the left lung apex which limits evaluation. Cardiomediastinal silhouette is within normal limits. There is some minimal patchy opacities in the left lung base. There is no pleural effusion. There is no large pneumothorax. No acute fractures are seen. IMPRESSION: Limited evaluation. Minimal patchy opacities in the left lung base could represent atelectasis or infection. Electronically Signed   By: Darliss Cheney M.D.   On: 06/30/2022 19:45   CT HEAD WO CONTRAST ( )  Result Date: 06/30/2022 CLINICAL DATA:  Unable to walk/ambulate. EXAM: CT HEAD WITHOUT CONTRAST CT CERVICAL SPINE WITHOUT CONTRAST TECHNIQUE: Multidetector CT imaging of the head and cervical spine was performed following the standard protocol without intravenous contrast. Multiplanar CT image reconstructions of the cervical spine were also generated. RADIATION DOSE REDUCTION:  This exam was performed according to the departmental dose-optimization program which includes automated exposure control, adjustment of the mA and/or kV according to patient size and/or use of iterative reconstruction technique. COMPARISON:  04/24/2022 FINDINGS: CT HEAD FINDINGS Brain: Nonstandard positioning resulting in somewhat distorted images. This lowers diagnostic sensitivity and specificity. Stable ex vacuo prominence of the lateral ventricles. Periventricular white matter and corona radiata hypodensities favor chronic ischemic microvascular white matter disease. Otherwise, the brainstem, cerebellum, cerebral peduncles, thalamus, basal ganglia, basilar cisterns, and ventricular system appear within normal limits. No intracranial hemorrhage, mass lesion, or acute CVA. Vascular: Unremarkable Skull: Unremarkable Sinuses/Orbits: Chronic ethmoid and maxillary sinusitis. Other: No supplemental non-categorized findings. CT CERVICAL SPINE  FINDINGS Alignment: The neck is substantially flexed. No significant abnormal subluxation. Skull base and vertebrae: Dental cavities noted as on image 59 series 9. No cervical spine fracture or acute bony findings identified. Soft tissues and spinal canal: Unremarkable Disc levels:  No osseous impingement identified. Upper chest: Biapical pleuroparenchymal scarring. Other: No supplemental non-categorized findings. IMPRESSION: 1. No acute intracranial findings or acute cervical spine findings. 2. Periventricular white matter and corona radiata hypodensities favor chronic ischemic microvascular white matter disease. 3. Substantial flexion of the cervical spine, query antecollis. 4. Chronic ethmoid and maxillary sinusitis. 5. Dental cavities. Electronically Signed   By: Gaylyn RongWalter  Liebkemann M.D.   On: 06/30/2022 19:33   CT Cervical Spine Wo Contrast  Result Date: 06/30/2022 CLINICAL DATA:  Unable to walk/ambulate. EXAM: CT HEAD WITHOUT CONTRAST CT CERVICAL SPINE WITHOUT CONTRAST  TECHNIQUE: Multidetector CT imaging of the head and cervical spine was performed following the standard protocol without intravenous contrast. Multiplanar CT image reconstructions of the cervical spine were also generated. RADIATION DOSE REDUCTION: This exam was performed according to the departmental dose-optimization program which includes automated exposure control, adjustment of the mA and/or kV according to patient size and/or use of iterative reconstruction technique. COMPARISON:  04/24/2022 FINDINGS: CT HEAD FINDINGS Brain: Nonstandard positioning resulting in somewhat distorted images. This lowers diagnostic sensitivity and specificity. Stable ex vacuo prominence of the lateral ventricles. Periventricular white matter and corona radiata hypodensities favor chronic ischemic microvascular white matter disease. Otherwise, the brainstem, cerebellum, cerebral peduncles, thalamus, basal ganglia, basilar cisterns, and ventricular system appear within normal limits. No intracranial hemorrhage, mass lesion, or acute CVA. Vascular: Unremarkable Skull: Unremarkable Sinuses/Orbits: Chronic ethmoid and maxillary sinusitis. Other: No supplemental non-categorized findings. CT CERVICAL SPINE FINDINGS Alignment: The neck is substantially flexed. No significant abnormal subluxation. Skull base and vertebrae: Dental cavities noted as on image 59 series 9. No cervical spine fracture or acute bony findings identified. Soft tissues and spinal canal: Unremarkable Disc levels:  No osseous impingement identified. Upper chest: Biapical pleuroparenchymal scarring. Other: No supplemental non-categorized findings. IMPRESSION: 1. No acute intracranial findings or acute cervical spine findings. 2. Periventricular white matter and corona radiata hypodensities favor chronic ischemic microvascular white matter disease. 3. Substantial flexion of the cervical spine, query antecollis. 4. Chronic ethmoid and maxillary sinusitis. 5. Dental  cavities. Electronically Signed   By: Gaylyn RongWalter  Liebkemann M.D.   On: 06/30/2022 19:33    EKG: I independently viewed the EKG done and my findings are as followed: Atrial fibrillation rate of 68.  Nonspecific ST-T changes.  QTc 435.  Assessment/Plan Present on Admission: **None**  Principal Problem:   Generalized weakness  Generalized weakness, multifactorial secondary to advanced dementia, poor oral intake, dehydration, presumptive UTI. Continue IV fluid hydration Replete electrolytes as indicated Follow urine culture for ID and sensitivities, continue Rocephin until urine culture results. Encourage oral intake as tolerated Dietitian consulted PT OT assessment Fall precautions  Failure to thrive in an adult Management as stated above  Severe protein calorie malnutrition Weight 54 kg Albumin 3.3 Severe muscle mass loss Dietitian consulted Encourage oral intake as tolerated Aspiration precautions, feeding assistance  Presumptive UTI UA positive for pyuria Started on Rocephin empirically Follow urine culture for ID and sensitivity to narrow down antibiotic as able  Permanent atrial fibrillation Rate controlled on Toprol-XL 12.5 mg daily Resume home Eliquis for CVA prevention. Monitor on telemetry.  Hyperlipidemia Resume home Crestor  GERD Resume home PPI   DVT prophylaxis: On Eliquis  Code Status: Full code  Family Communication:  None at bedside  Disposition Plan: Admitted to telemetry unit  Consults called: None.  Admission status: Inpatient status.   Status is: Inpatient The patient requires at least 2 midnights for further evaluation and treatment of present condition.   Darlin Drop MD [facility] Pager [phone]  If 7PM-7AM, please contact night-coverage [email or web address] Password [email or web address]  07/01/2022, 2:23 AM

## 2022-07-01 NOTE — Evaluation (Signed)
Physical Therapy Evaluation Patient Details Name: Donna Simmons MRN: [id number] DOB: [birth year] Today's Date: 07/01/2022  History of Present Illness  81 y.o. female with medical history significant for late onset Alzheimer's dementia with behavioral disturbance, followed by gerontology, permanent atrial fibrillation on Eliquis, essential hypertension, hyperlipidemia, who presented from home due to failure to thrive and frequent falls for the past 2 months.  Family endorses poor oral intake, generalized weakness, and progressive difficulty with ambulation.  Donna Simmons the day prior to presentation, landing on her knees. Dx of UTI, FTT.  Clinical Impression  Pt admitted with above diagnosis. Pt is pleasantly confused, stated her name is "Donna Simmons" (maiden name?), unable to state her date of birth, not oriented to location nor year. Requires verbal/manual cues to follow commands. Total assist for supine to sit. Noted pt's bed was completely saturated in urine (NT notified). Max assist for sit to stand with RW, pt able to come to partial stand with flexed posture with RW for ~20 seconds. Pt not able to provide her prior level of function due to confusion. SNF recommended.  Pt currently with functional limitations due to the deficits listed below (see PT Problem List). Pt will benefit from skilled PT to increase their independence and safety with mobility to allow discharge to the venue listed below.          Recommendations for follow up therapy are one component of a multi-disciplinary discharge planning process, led by the attending physician.  Recommendations may be updated based on patient status, additional functional criteria and insurance authorization.  Follow Up Recommendations Skilled nursing-short term rehab (<3 hours/day) Can patient physically be transported by private vehicle: No    Assistance Recommended at Discharge Frequent or constant Supervision/Assistance  Patient can return  home with the following  Two people to help with walking and/or transfers;A lot of help with bathing/dressing/bathroom;Assistance with cooking/housework;Direct supervision/assist for medications management;Assist for transportation;Help with stairs or ramp for entrance;Direct supervision/assist for financial management    Equipment Recommendations None recommended by PT  Recommendations for Other Services       Functional Status Assessment Patient has had a recent decline in their functional status and demonstrates the ability to make significant improvements in function in a reasonable and predictable amount of time.     Precautions / Restrictions Precautions Precautions: Fall Precaution Comments: multiple falls recently per H&P, pt unable to give falls hx 2* dementia Restrictions Weight Bearing Restrictions: No      Mobility  Bed Mobility Overal bed mobility: Needs Assistance Bed Mobility: Sit to Supine, Supine to Sit     Supine to sit: Total assist, HOB elevated Sit to supine: Total assist, HOB elevated   General bed mobility comments: assist to raise trunk and pivot hips to edge of bed, and then to return to supine,  pt 10%    Transfers Overall transfer level: Needs assistance Equipment used: Rolling walker (2 wheels) Transfers: Sit to/from Stand Sit to Stand: Max assist           General transfer comment: physical assist for hand placement, attempted sit to stand, pt able to come to a partial stand with max assist, unable to fully stand upright, flexed/kyphotic posture in standing, pt able to stand for ~20 seconds for a quick bedpad change (pt's bed was saturated in urine)    Ambulation/Gait               General Gait Details: unable  Stairs  Wheelchair Mobility    Modified Rankin (Stroke Patients Only)       Balance Overall balance assessment: Needs assistance Sitting-balance support: Feet supported, No upper extremity  supported Sitting balance-Leahy Scale: Fair     Standing balance support: Bilateral upper extremity supported Standing balance-Leahy Scale: Poor Standing balance comment: flexed/kyphotic                             Pertinent Vitals/Pain Pain Assessment Pain Assessment: Faces Faces Pain Scale: Hurts little more Pain Location: buttocks with bed mobility Pain Descriptors / Indicators: Grimacing, Guarding Pain Intervention(s): Limited activity within patient's tolerance, Monitored during session, Repositioned    Home Living Family/patient expects to be discharged to:: Private residence Living Arrangements: Spouse/significant other Available Help at Discharge: Family                    Prior Function Prior Level of Function : Patient poor historian/Family not available             Mobility Comments: no family in room at this time. all infomation above was taken from chart,  patient is unable to provide PLOF at this time due to cognitive deficits.       Hand Dominance        Extremity/Trunk Assessment   Upper Extremity Assessment Upper Extremity Assessment: Defer to OT evaluation    Lower Extremity Assessment Lower Extremity Assessment: Generalized weakness;Difficult to assess due to impaired cognition RLE Deficits / Details: pt unable to follow commands for strength testing LLE Deficits / Details: pt unable to follow commands for strength testing    Cervical / Trunk Assessment Cervical / Trunk Assessment: Kyphotic  Communication   Communication: No difficulties  Cognition Arousal/Alertness: Awake/alert Behavior During Therapy: WFL for tasks assessed/performed Overall Cognitive Status: No family/caregiver present to determine baseline cognitive functioning                                 General Comments: pt stated her name is "Donna Simmons" (perhaps this was her maiden name?), not oriented to location nor to current year, could  not state her birthdate, pleasantly confused, follows commands with increased timea and with verbal/manual cues        General Comments      Exercises     Assessment/Plan    PT Assessment Patient needs continued PT services  PT Problem List Decreased activity tolerance;Decreased mobility;Decreased strength;Decreased balance;Decreased cognition       PT Treatment Interventions Therapeutic activities;Therapeutic exercise;Gait training;Patient/family education;Functional mobility training    PT Goals (Current goals can be found in the Care Plan section)  Acute Rehab PT Goals PT Goal Formulation: Patient unable to participate in goal setting Time For Goal Achievement: 07/15/22 Potential to Achieve Goals: Fair    Frequency Min 2X/week     Co-evaluation               AM-PAC PT "6 Clicks" Mobility  Outcome Measure Help needed turning from your back to your side while in a flat bed without using bedrails?: Total Help needed moving from lying on your back to sitting on the side of a flat bed without using bedrails?: Total Help needed moving to and from a bed to a chair (including a wheelchair)?: Total Help needed standing up from a chair using your arms (e.g., wheelchair or bedside chair)?: Total Help needed to walk  in hospital room?: Total Help needed climbing 3-5 steps with a railing? : Total 6 Click Score: 6    End of Session   Activity Tolerance: Patient limited by fatigue Patient left: in bed;with bed alarm set;with call bell/phone within reach;with nursing/sitter in room Nurse Communication: Mobility status;Other (comment) (bed saturated in urine, pt on purewick) PT Visit Diagnosis: Unsteadiness on feet (R26.81);Difficulty in walking, not elsewhere classified (R26.2);History of falling (Z91.81);Repeated falls (R29.6);Adult, failure to thrive (R62.7)    Time: 0626-9485 PT Time Calculation (min) (ACUTE ONLY): 19 min   Charges:   PT Evaluation $PT Eval Moderate  Complexity: 1 Mod          Tamala Ser PT 07/01/2022  Acute Rehabilitation Services  Office [phone]

## 2022-07-02 DIAGNOSIS — L8990 Pressure ulcer of unspecified site, unspecified stage: Secondary | ICD-10-CM | POA: Insufficient documentation

## 2022-07-02 DIAGNOSIS — N3000 Acute cystitis without hematuria: Secondary | ICD-10-CM | POA: Diagnosis not present

## 2022-07-02 DIAGNOSIS — R531 Weakness: Secondary | ICD-10-CM | POA: Diagnosis not present

## 2022-07-02 DIAGNOSIS — R627 Adult failure to thrive: Secondary | ICD-10-CM | POA: Diagnosis not present

## 2022-07-02 LAB — CBC WITH DIFFERENTIAL/PLATELET
Abs Immature Granulocytes: 0.02 10*3/uL (ref 0.00–0.07)
Basophils Absolute: 0 10*3/uL (ref 0.0–0.1)
Basophils Relative: 0 %
Eosinophils Absolute: 0 10*3/uL (ref 0.0–0.5)
Eosinophils Relative: 0 %
HCT: 35.1 % — ABNORMAL LOW (ref 36.0–46.0)
Hemoglobin: 11.2 g/dL — ABNORMAL LOW (ref 12.0–15.0)
Immature Granulocytes: 0 %
Lymphocytes Relative: 13 %
Lymphs Abs: 0.9 10*3/uL (ref 0.7–4.0)
MCH: 25.9 pg — ABNORMAL LOW (ref 26.0–34.0)
MCHC: 31.9 g/dL (ref 30.0–36.0)
MCV: 81.1 fL (ref 80.0–100.0)
Monocytes Absolute: 0.5 10*3/uL (ref 0.1–1.0)
Monocytes Relative: 7 %
Neutro Abs: 5.8 10*3/uL (ref 1.7–7.7)
Neutrophils Relative %: 80 %
Platelets: 185 10*3/uL (ref 150–400)
RBC: 4.33 MIL/uL (ref 3.87–5.11)
RDW: 15.8 % — ABNORMAL HIGH (ref 11.5–15.5)
WBC: 7.3 10*3/uL (ref 4.0–10.5)
nRBC: 0 % (ref 0.0–0.2)

## 2022-07-02 LAB — BASIC METABOLIC PANEL
Anion gap: 7 (ref 5–15)
BUN: 9 mg/dL (ref 8–23)
CO2: 25 mmol/L (ref 22–32)
Calcium: 8.6 mg/dL — ABNORMAL LOW (ref 8.9–10.3)
Chloride: 109 mmol/L (ref 98–111)
Creatinine, Ser: 0.7 mg/dL (ref 0.44–1.00)
GFR, Estimated: >60 mL/min (ref >60)
Glucose, Bld: 101 mg/dL — ABNORMAL HIGH (ref 70–99)
Potassium: 4 mmol/L (ref 3.5–5.1)
Sodium: 141 mmol/L (ref 135–145)

## 2022-07-02 LAB — MAGNESIUM: Magnesium: 1.9 mg/dL (ref 1.7–2.4)

## 2022-07-02 NOTE — Progress Notes (Signed)
[facility]                                                                               Donna Simmons, is a 81 y.o. female, DOB - 01-Jun-1941, OEV:[id number] Admit date - 06/30/2022    Outpatient Primary MD for the patient is Donna Oman, DO  LOS - 2  days    Brief summary   [name] is a 81 year old female with PMH Alzheimer's dementia with behavioral disturbance, permanent A-fib, HTN, HLD who presented with worsening weakness and falls over the past approximately 2 months. UA was positive for pyuria and empiric Rocephin was initiated for presented UTI.  Due to failure to thrive and recurrent falls, EDP requested admission.  The patient was admitted by [facility], hospitalist service.    Assessment & Plan    Assessment and Plan:  Generalized weakness, multifactorial secondary to advanced dementia, poor oral intake, dehydration, presumptive UTI. - continue with IV fluids and IV antibiotics, IV rocephin .  - Follow up the urine and blood cultures report.  - therapy eval recommending SNF.   - currently on rocephin.     Failure to thrive in an adult:  PT therapy eval recommending SNF.    Severe protein calorie malnutrition;  Dietary consulted.     Permanent Atrial Fibrillation;  Rate controlled with toprol XL  Resume eliquis for CVA PREVENTION.     Hyperlipidemia: resume Crestor.    GERD:  Resume PPI.     RN Pressure Injury Documentation: Pressure Injury 07/01/22 Coccyx Right;Left Stage 2 -  Partial thickness loss of dermis presenting as a shallow open injury with a red, pink wound bed without slough. (Active)  07/01/22 1405  Location: Coccyx  Location Orientation: Right;Left  Staging: Stage 2 -  Partial thickness loss of dermis presenting as a shallow open injury with a red, pink wound bed without slough.  Wound Description (Comments):   Present on Admission: Yes  Dressing Type Foam - Lift dressing to assess site every shift 07/02/22 1325    Wound care consulted.      Estimated body mass index is 21.24 kg/m as calculated from the following:   Height as of 04/24/22: 5\' 3"  (1.6 m).   Weight as of 04/24/22: 54.4 kg.  Code Status: full code.  DVT Prophylaxis:   apixaban (ELIQUIS) tablet 5 mg   Level of Care: Level of care: Telemetry Family Communication: none at bedside.   Disposition Plan:     Remains inpatient appropriate:    Procedures:  None.   Consultants:   Palliative care.   Antimicrobials:   Anti-infectives (From admission, onward)    Start     Dose/Rate Route Frequency Ordered Stop   07/01/22 1000  cefTRIAXone (ROCEPHIN) 2 g in sodium chloride 0.9 % 100 mL IVPB        2 g 200 mL/hr over 30 Minutes Intravenous Every 24 hours 06/30/22 2329     06/30/22 2245  cefTRIAXone (ROCEPHIN) 1 g in sodium chloride 0.9 % 100 mL IVPB        1 g 200 mL/hr over 30 Minutes Intravenous  Once 06/30/22 2236 07/01/22 0015  Medications  Scheduled Meds:  apixaban  5 mg Oral BID   feeding supplement  237 mL Oral BID BM   metoprolol succinate  12.5 mg Oral Daily   pantoprazole  40 mg Oral Daily   Continuous Infusions:  cefTRIAXone (ROCEPHIN)  IV 2 g (07/02/22 1008)   lactated ringers 1,000 mL with potassium chloride 40 mEq infusion 50 mL/hr at 07/01/22 2046   PRN Meds:.acetaminophen, melatonin, polyethylene glycol, prochlorperazine    Subjective:   [name] was seen and examined today.  No new complaints.   Objective:   Vitals:   07/01/22 1610 07/01/22 1953 07/02/22 0010 07/02/22 0423  BP: 117/70 (!) 147/78 134/83 (!) 147/80  Pulse: 89 65 67 69  Resp: 18 20 20 20   Temp: 98.7 F (37.1 C) 99 F (37.2 C) 97.6 F (36.4 C) 98.5 F (36.9 C)  TempSrc: Oral Oral Oral   SpO2: 90% (!) 85%  (!) 88%    Intake/Output Summary (Last 24 hours) at 07/02/2022 1333 Last data filed at 07/02/2022 0911 Gross per 24 hour  Intake 1277.74 ml  Output 750 ml  Net 527.74 ml   There were no vitals filed for  this visit.   Exam General: alert, comfortable.  Cardiovascular: S1 S2 auscultated, no murmurs, RRR Respiratory: Clear to auscultation bilaterally, no wheezing, rales or rhonchi Gastrointestinal: Soft, nontender, nondistended, + bowel sounds Ext: no pedal edema bilaterally Neuro: Alert and comfortable, answering simple questions.  Skin: No rashes    Data Reviewed:  I have personally reviewed following labs and imaging studies   CBC Lab Results  Component Value Date   WBC 7.3 07/02/2022   RBC 4.33 07/02/2022   HGB 11.2 (L) 07/02/2022   HCT 35.1 (L) 07/02/2022   MCV 81.1 07/02/2022   MCH 25.9 (L) 07/02/2022   PLT 185 07/02/2022   MCHC 31.9 07/02/2022   RDW 15.8 (H) 07/02/2022   LYMPHSABS 0.9 07/02/2022   MONOABS 0.5 07/02/2022   EOSABS 0.0 07/02/2022   BASOSABS 0.0 07/02/2022     Last metabolic panel Lab Results  Component Value Date   NA 141 07/02/2022   K 4.0 07/02/2022   CL 109 07/02/2022   CO2 25 07/02/2022   BUN 9 07/02/2022   CREATININE 0.70 07/02/2022   GLUCOSE 101 (H) 07/02/2022   GFRNONAA >60 07/02/2022   GFRAA 62 04/04/2020   CALCIUM 8.6 (L) 07/02/2022   PHOS 3.2 07/01/2022   PROT 5.6 (L) 07/01/2022   ALBUMIN 2.8 (L) 07/01/2022   LABGLOB 2.6 04/04/2020   AGRATIO 1.7 04/04/2020   BILITOT 1.0 07/01/2022   ALKPHOS 40 07/01/2022   AST 65 (H) 07/01/2022   ALT 23 07/01/2022   ANIONGAP 7 07/02/2022    CBG (last 3)  No results for input(s): "GLUCAP" in the last 72 hours.    Coagulation Profile: No results for input(s): "INR", "PROTIME" in the last 168 hours.   Radiology Studies: DG Knee Complete 4 Views Left  Result Date: 06/30/2022 CLINICAL DATA:  Fall, weakness EXAM: LEFT KNEE - COMPLETE 4+ VIEW COMPARISON:  04/26/2018 FINDINGS: No evidence of fracture, dislocation, or joint effusion. Mild tricompartmental osteoarthritis, most pronounced within the medial compartment. Soft tissues are unremarkable. IMPRESSION: Negative. Electronically Signed    By: 04/28/2018 D.O.   On: 06/30/2022 19:50   DG Knee Complete 4 Views Right  Result Date: 06/30/2022 CLINICAL DATA:  Fall, weakness EXAM: RIGHT KNEE - COMPLETE 4+ VIEW COMPARISON:  None Available. FINDINGS: The knee is substantially flexed on imaging,  reportedly the patient is contracted and straightening the knee was not feasible. This results in nonstandard orientation and some reduction in diagnostic sensitivity and specificity. No fracture or acute bony findings. No definite knee effusion. Mild marginal spurring of the patella. IMPRESSION: 1. No fracture or acute bony findings identified. 2. Mild marginal spurring of the patella. 3. Nonstandard positioning due to patient contracture. Electronically Signed   By: Gaylyn Rong M.D.   On: 06/30/2022 19:49   DG Pelvis 1-2 Views  Result Date: 06/30/2022 CLINICAL DATA:  Fall. EXAM: PELVIS - 1-2 VIEW COMPARISON:  Pelvis x-ray 08/06/2021 FINDINGS: There is no evidence of pelvic fracture or diastasis. No pelvic bone lesions are seen. Soft tissues are within normal limits. IMPRESSION: No fracture or dislocation. Electronically Signed   By: Darliss Cheney M.D.   On: 06/30/2022 19:47   DG Chest 2 View  Result Date: 06/30/2022 CLINICAL DATA:  Fall EXAM: CHEST - 2 VIEW COMPARISON:  Chest x-ray 12/03/2021 FINDINGS: Patient's chin overlies the left lung apex which limits evaluation. Cardiomediastinal silhouette is within normal limits. There is some minimal patchy opacities in the left lung base. There is no pleural effusion. There is no large pneumothorax. No acute fractures are seen. IMPRESSION: Limited evaluation. Minimal patchy opacities in the left lung base could represent atelectasis or infection. Electronically Signed   By: Darliss Cheney M.D.   On: 06/30/2022 19:45   CT HEAD WO CONTRAST ( )  Result Date: 06/30/2022 CLINICAL DATA:  Unable to walk/ambulate. EXAM: CT HEAD WITHOUT CONTRAST CT CERVICAL SPINE WITHOUT CONTRAST TECHNIQUE:  Multidetector CT imaging of the head and cervical spine was performed following the standard protocol without intravenous contrast. Multiplanar CT image reconstructions of the cervical spine were also generated. RADIATION DOSE REDUCTION: This exam was performed according to the departmental dose-optimization program which includes automated exposure control, adjustment of the mA and/or kV according to patient size and/or use of iterative reconstruction technique. COMPARISON:  04/24/2022 FINDINGS: CT HEAD FINDINGS Brain: Nonstandard positioning resulting in somewhat distorted images. This lowers diagnostic sensitivity and specificity. Stable ex vacuo prominence of the lateral ventricles. Periventricular white matter and corona radiata hypodensities favor chronic ischemic microvascular white matter disease. Otherwise, the brainstem, cerebellum, cerebral peduncles, thalamus, basal ganglia, basilar cisterns, and ventricular system appear within normal limits. No intracranial hemorrhage, mass lesion, or acute CVA. Vascular: Unremarkable Skull: Unremarkable Sinuses/Orbits: Chronic ethmoid and maxillary sinusitis. Other: No supplemental non-categorized findings. CT CERVICAL SPINE FINDINGS Alignment: The neck is substantially flexed. No significant abnormal subluxation. Skull base and vertebrae: Dental cavities noted as on image 59 series 9. No cervical spine fracture or acute bony findings identified. Soft tissues and spinal canal: Unremarkable Disc levels:  No osseous impingement identified. Upper chest: Biapical pleuroparenchymal scarring. Other: No supplemental non-categorized findings. IMPRESSION: 1. No acute intracranial findings or acute cervical spine findings. 2. Periventricular white matter and corona radiata hypodensities favor chronic ischemic microvascular white matter disease. 3. Substantial flexion of the cervical spine, query antecollis. 4. Chronic ethmoid and maxillary sinusitis. 5. Dental cavities.  Electronically Signed   By: Gaylyn Rong M.D.   On: 06/30/2022 19:33   CT Cervical Spine Wo Contrast  Result Date: 06/30/2022 CLINICAL DATA:  Unable to walk/ambulate. EXAM: CT HEAD WITHOUT CONTRAST CT CERVICAL SPINE WITHOUT CONTRAST TECHNIQUE: Multidetector CT imaging of the head and cervical spine was performed following the standard protocol without intravenous contrast. Multiplanar CT image reconstructions of the cervical spine were also generated. RADIATION DOSE REDUCTION: This exam was performed according to  the departmental dose-optimization program which includes automated exposure control, adjustment of the mA and/or kV according to patient size and/or use of iterative reconstruction technique. COMPARISON:  04/24/2022 FINDINGS: CT HEAD FINDINGS Brain: Nonstandard positioning resulting in somewhat distorted images. This lowers diagnostic sensitivity and specificity. Stable ex vacuo prominence of the lateral ventricles. Periventricular white matter and corona radiata hypodensities favor chronic ischemic microvascular white matter disease. Otherwise, the brainstem, cerebellum, cerebral peduncles, thalamus, basal ganglia, basilar cisterns, and ventricular system appear within normal limits. No intracranial hemorrhage, mass lesion, or acute CVA. Vascular: Unremarkable Skull: Unremarkable Sinuses/Orbits: Chronic ethmoid and maxillary sinusitis. Other: No supplemental non-categorized findings. CT CERVICAL SPINE FINDINGS Alignment: The neck is substantially flexed. No significant abnormal subluxation. Skull base and vertebrae: Dental cavities noted as on image 59 series 9. No cervical spine fracture or acute bony findings identified. Soft tissues and spinal canal: Unremarkable Disc levels:  No osseous impingement identified. Upper chest: Biapical pleuroparenchymal scarring. Other: No supplemental non-categorized findings. IMPRESSION: 1. No acute intracranial findings or acute cervical spine findings. 2.  Periventricular white matter and corona radiata hypodensities favor chronic ischemic microvascular white matter disease. 3. Substantial flexion of the cervical spine, query antecollis. 4. Chronic ethmoid and maxillary sinusitis. 5. Dental cavities. Electronically Signed   By: Gaylyn Rong M.D.   On: 06/30/2022 19:33       Kathlen Mody M.D. [facility] 07/02/2022, 1:33 PM  Available via Epic secure chat 7am-7pm After 7 pm, please refer to night coverage provider listed on amion.

## 2022-07-02 NOTE — Progress Notes (Signed)
Initial Nutrition Assessment  DOCUMENTATION CODES:   Severe malnutrition in context of chronic illness  INTERVENTION:  -Continue regular diet -Continue to offer feeding assistance at meals -Continue Ensure Plus HP BID (350kcal, 20g protein) -Monitor for palliative consult and provide nutrition interventions consistent with pt and family's POC goals.  NUTRITION DIAGNOSIS:  Severe Malnutrition related to chronic illness (Alzheimer's) as evidenced by severe fat depletion, severe muscle depletion.  GOAL:  Patient will meet greater than or equal to 90% of their needs  MONITOR: PO intake, Supplement acceptance  REASON FOR ASSESSMENT:  Consult Assessment of nutrition requirement/status  ASSESSMENT:  Pt is a 81yo F with PMH of Alzheimer's dementia with behavioral disturbance, afib, GERD, HTN, and HLD who presents with failure to thrive and frequent falls for the past 2 months.  Visited pt at bedside. She has been drowsy and sleeping for most of the day. Pt did not wake to voice or NFPE. No family present to obtain diet recall. Reported decreased po intake over the last 2 months and difficulty with ambulation w/ frequent falls. NFPE reveals severe muscle wasting and severe fat loss. Pt meets ASPEN criteria for severe protein calorie malnutrition r/t chronic illness (Alzheimer's disease).    Pt is on liberalized diet. She is offered encouragement and assistance at meals with nursing staff. She is ordered Ensure Plus HP BID (350kcal, 20g protein/bottle) with variable acceptance. Palliative care consulted. Monitor for POC goals to determine need for nutrition support if po intake does not improve.  Medications reviewed and include: eliquis, toprol-xl, protonix, LR@50mL /hr  Labs reviewed: BG:101   NUTRITION - FOCUSED PHYSICAL EXAM:  Flowsheet Row Most Recent Value  Orbital Region Severe depletion  Upper Arm Region Severe depletion  Thoracic and Lumbar Region Moderate depletion  Buccal  Region Severe depletion  Temple Region Severe depletion  Clavicle Bone Region Severe depletion  Clavicle and Acromion Bone Region Severe depletion  Scapular Bone Region Unable to assess  Dorsal Hand Severe depletion  Patellar Region Mild depletion  Anterior Thigh Region Moderate depletion  Posterior Calf Region Moderate depletion  Hair Reviewed  Eyes Reviewed  Mouth Reviewed  Skin Reviewed  Nails Reviewed       Diet Order:   Diet Order             Diet regular Room service appropriate? Yes; Fluid consistency: Thin  Diet effective now                   EDUCATION NEEDS:   Not appropriate for education at this time  Skin:  Skin Assessment: Skin Integrity Issues: Skin Integrity Issues:: Stage II Stage II: coccyx  Last BM:  unknown, PTA  Height:  Ht Readings from Last 1 Encounters:  04/24/22 5\' 3"  (1.6 m)   Weight:  Wt Readings from Last 1 Encounters:  04/24/22 54.4 kg    BMI:  There is no height or weight on file to calculate BMI.  Estimated Nutritional Needs:   Kcal:  1630-1905kcal  Protein:  65-85g  Fluid:  1649mL   Candise Bowens, MS, RD, LDN, CNSC See AMiON for contact information

## 2022-07-02 NOTE — NC FL2 (Signed)
[place] MEDICAID FL2 LEVEL OF CARE SCREENING TOOL     IDENTIFICATION  Patient Name: Donna Simmons Birthdate: 26-Jun-1941 Sex: female Admission Date (Current Location): 06/30/2022  [facility] and [place] Number:  Producer, television/film/video and Address:  [facility],  [address] [place], [place] [address]      Provider Number: [id number]  Attending Physician Name and Address:  Kathlen Mody, MD  Relative Name and Phone Number:   Quiera Diffee [phone])    Current Level of Care: SNF Recommended Level of Care: Skilled Nursing Facility Prior Approval Number:    Date Approved/Denied:   PASRR Number:  [id number])  Discharge Plan: SNF    Current Diagnoses: Patient Active Problem List   Diagnosis Date Noted   Pressure injury of skin 07/02/2022   Acute cystitis without hematuria    Failure to thrive in adult    Weakness 12/03/2021   GERD (gastroesophageal reflux disease)    Generalized weakness 10/14/2021   Bilateral lower extremity edema 08/19/2020   Other forms of angina pectoris 07/25/2018   Long term current use of anticoagulant therapy 11/18/2012   Hypercholesteremia 09/25/2012   DDD (degenerative disc disease), cervical 05/19/2012   Paroxysmal atrial fibrillation (HCC) - CHA2DS2-VASc Score 3, on Eliquis 04/20/2012   Essential hypertension 04/20/2012    Orientation RESPIRATION BLADDER Height & Weight     Self  Normal Incontinent Weight:   Height:     BEHAVIORAL SYMPTOMS/MOOD NEUROLOGICAL BOWEL NUTRITION STATUS      Incontinent Diet (Regular)  AMBULATORY STATUS COMMUNICATION OF NEEDS Skin   Limited Assist Verbally PU Stage and Appropriate Care   PU Stage 2 Dressing:  (foam dsg)                   Personal Care Assistance Level of Assistance  Bathing, Feeding, Dressing Bathing Assistance: Limited assistance Feeding assistance: Limited assistance Dressing Assistance: Limited assistance     Functional Limitations Info  Sight, Hearing,  Speech Sight Info: Adequate Hearing Info: Adequate Speech Info: Impaired (Dentures-bottom)    SPECIAL CARE FACTORS FREQUENCY  PT (By licensed PT), OT (By licensed OT)     PT Frequency:  (5x week) OT Frequency:  (5x week)            Contractures Contractures Info: Not present    Additional Factors Info  Code Status, Allergies Code Status Info:  (Full) Allergies Info:  (Antihistamines, Chlorpheniramine-type)           Current Medications (07/02/2022):  This is the current hospital active medication list Current Facility-Administered Medications  Medication Dose Route Frequency Provider Last Rate Last Admin   acetaminophen (TYLENOL) tablet 650 mg  650 mg Oral Q6H PRN Dow Adolph N, DO   650 mg at 07/02/22 0943   apixaban (ELIQUIS) tablet 5 mg  5 mg Oral BID Dow Adolph N, DO   5 mg at 07/02/22 0943   cefTRIAXone (ROCEPHIN) 2 g in sodium chloride 0.9 % 100 mL IVPB  2 g Intravenous Q24H Dow Adolph N, DO 200 mL/hr at 07/02/22 1008 2 g at 07/02/22 1008   feeding supplement (ENSURE ENLIVE / ENSURE PLUS) liquid 237 mL  237 mL Oral BID BM Dow Adolph N, DO   237 mL at 07/02/22 0947   lactated ringers 1,000 mL with potassium chloride 40 mEq infusion   Intravenous Continuous Darlin Drop, DO 50 mL/hr at 07/01/22 2046 New Bag at 07/01/22 2046   melatonin tablet 5 mg  5 mg Oral  QHS PRN Kayleen Memos, DO       metoprolol succinate (TOPROL-XL) 24 hr tablet 12.5 mg  12.5 mg Oral Daily Irene Pap N, DO   12.5 mg at 07/02/22 0943   pantoprazole (PROTONIX) EC tablet 40 mg  40 mg Oral Daily Irene Pap N, DO   40 mg at 07/02/22 0943   polyethylene glycol (MIRALAX / GLYCOLAX) packet 17 g  17 g Oral Daily PRN Kayleen Memos, DO   17 g at 07/02/22 5732   prochlorperazine (COMPAZINE) injection 5 mg  5 mg Intravenous Q6H PRN Kayleen Memos, DO         Discharge Medications: Please see discharge summary for a list of discharge medications.  Relevant Imaging Results:  Relevant Lab  Results:   Additional Information SS# [id number]  Dessa Phi, RN

## 2022-07-02 NOTE — Consult Note (Addendum)
WOC consult requested for coccyx. Performed remotely after review of the EMR.  Pt has a Stage 2 pressure injury to the coccyx which was noted as present on admission, according to the nursing wound flow sheet.  These can be treated independently by the bedside nurses using the Skin care order set in Epic; Foam dressing to coccyx, change Q 3 days or PRN soiling. Please re-consult if further assistance is needed.  Thank-you,  Julien Girt MSN, [place], [place], [place], [place]

## 2022-07-02 NOTE — Progress Notes (Signed)
Call received from husband to check on patient. Updated that patient is drowsy this morning and has had minimal intake, despite encouragement and assistance from staff. Husband reports that patient had experienced increased drowsiness at home/with some days being minimally to unresponsive.  Education provided that antibiotic treatment currently in place for UTI and its efficacy is being monitored. Husband updated that PMT has been consulted by Dr. Karleen Hampshire. He expresses appreciation for the care that staff are providing patient here in the hospital.  Ivan Anchors, RN 07/02/22 1:58 PM

## 2022-07-02 NOTE — TOC Initial Note (Signed)
Transition of Care [facility]) - Initial/Assessment Note    Patient Details  Name: Donna Simmons MRN: [id number] Date of Birth: [birth year]  [facility]) CM/SW Contact:    Dessa Phi, RN Phone Number: 07/02/2022, 12:59 PM  Clinical Narrative: PT recc [facility]. Dementia. Spoke to Thurmond(spouse) agree to [facility] SNF.Faxed out await bed offers,choice,prior auth.                  Expected Discharge Plan: Skilled Nursing Facility Barriers to Discharge: Continued Medical Work up   Patient Goals and CMS Choice Patient states their goals for this hospitalization and ongoing recovery are::  (Rehab) CMS Medicare.gov Compare Post Acute Care list provided to:: Patient Represenative (must comment) (Thurmond(spouse)) Choice offered to / list presented to : Spouse  Expected Discharge Plan and Services Expected Discharge Plan: [place]   Discharge Planning Services: CM Consult Post Acute Care Choice: [place] Living arrangements for the past 2 months: Single Family Home                                      Prior Living Arrangements/Services Living arrangements for the past 2 months: Single Family Home Lives with:: Spouse Patient language and need for interpreter reviewed:: Yes Do you feel safe going back to the place where you live?: Yes      Need for Family Participation in Patient Care: Yes (Comment) Care giver support system in place?: Yes (comment)   Criminal Activity/Legal Involvement Pertinent to Current Situation/Hospitalization: No - Comment as needed  Activities of Daily Living   ADL Screening (condition at time of admission) Patient's cognitive ability adequate to safely complete daily activities?: No Is the patient deaf or have difficulty hearing?: No Does the patient have difficulty seeing, even when wearing glasses/contacts?: No Does the patient have difficulty concentrating, remembering, or making decisions?: Yes Patient  able to express need for assistance with ADLs?: No Does the patient have difficulty dressing or bathing?: Yes Independently performs ADLs?: No Communication: Needs assistance Is this a change from baseline?: Pre-admission baseline Dressing (OT): Dependent Is this a change from baseline?: Pre-admission baseline Grooming: Needs assistance Is this a change from baseline?: Pre-admission baseline Feeding: Dependent Is this a change from baseline?: Pre-admission baseline Bathing: Needs assistance Is this a change from baseline?: Pre-admission baseline Toileting: Needs assistance Is this a change from baseline?: Pre-admission baseline In/Out Bed: Needs assistance Is this a change from baseline?: Pre-admission baseline Walks in Home: Dependent Is this a change from baseline?: Pre-admission baseline Does the patient have difficulty walking or climbing stairs?: Yes Weakness of Legs: Both Weakness of Arms/Hands: Both  Permission Sought/Granted Permission sought to share information with : Case Manager Permission granted to share information with : Yes, Verbal Permission Granted  Share Information with NAME:  (Case manager)           Emotional Assessment Appearance:: Appears stated age Attitude/Demeanor/Rapport: Gracious Affect (typically observed): Accepting Orientation: : Oriented to Self Alcohol / Substance Use: Not Applicable Psych Involvement: No (comment)  Admission diagnosis:  Failure to thrive in adult [R62.7] Acute cystitis without hematuria [N30.00] Generalized weakness [R53.1] Patient Active Problem List   Diagnosis Date Noted   Acute cystitis without hematuria    Failure to thrive in adult    Weakness 12/03/2021   GERD (gastroesophageal reflux disease)    Generalized weakness 10/14/2021   Bilateral lower extremity edema 08/19/2020  Other forms of angina pectoris 07/25/2018   Long term current use of anticoagulant therapy 11/18/2012   Hypercholesteremia 09/25/2012    DDD (degenerative disc disease), cervical 05/19/2012   Paroxysmal atrial fibrillation (HCC) - CHA2DS2-VASc Score 3, on Eliquis 04/20/2012   Essential hypertension 04/20/2012   PCP:  Yolanda Manges, DO Pharmacy:   [facility] - [place], [place] - [address] [place] [address] Phone: [phone] Fax: [phone]     Social Determinants of Health (SDOH) Interventions    Readmission Risk Interventions     No data to display

## 2022-07-02 NOTE — Evaluation (Signed)
Occupational Therapy Evaluation Patient Details Name: Donna Simmons MRN: [id number] DOB: 04/30/1941 Today's Date: 07/02/2022   History of Present Illness Donna Simmons is a 81 y.o. female with medical history significant for late onset Alzheimer's dementia with behavioral disturbance, followed by gerontology, permanent atrial fibrillation on Eliquis, essential hypertension, hyperlipidemia, who presented from home due to failure to thrive and frequent falls for the past 2 months.  Family endorses poor oral intake, generalized weakness, and progressive difficulty with ambulation.  [place] the day prior to presentation, landing on her knees.     In the ED, imaging were negative for fracture.  She was noted to be dehydrated with electrolytes abnormalities.  She received IV fluid hydration normal saline 500 cc/h x 1.  UA was positive for pyuria and empiric Rocephin was initiated for presented UTI.  Due to failure to thrive and recurrent falls, EDP requested admission.  The patient was admitted by [facility], hospitalist service.   Clinical Impression   The pt was found in bed. Her niece was present during the session, and assisted with providing information regarding the pt's prior level of functioning and living situation. The pt was unable to engage in meaningful conversational exchange, however she could make some simple utterances and answer some basic yes/no questions. She presented with suspected cognitive processing deficits, disorientation, delayed initiation, impaired problem solving, and impaired sustained attention. She required moderate hand-over-hand assist for simple self-feeding, and total assist for rolling in bed and total assist for lower body dressing. OT will continue to follow the pt for further services to help facilitate improved ADL performance and to decrease the risk for further functional decline.      Recommendations for follow up therapy are one component of a multi-disciplinary  discharge planning process, led by the attending physician.  Recommendations may be updated based on patient status, additional functional criteria and insurance authorization.   Follow Up Recommendations  Skilled nursing-short term rehab (<3 hours/day)    Assistance Recommended at Discharge Frequent or constant Supervision/Assistance  Patient can return home with the following Assistance with cooking/housework;Direct supervision/assist for financial management;Assistance with feeding;Assist for transportation;Direct supervision/assist for medications management;A lot of help with walking and/or transfers;A lot of help with bathing/dressing/bathroom    Functional Status Assessment  Patient has had a recent decline in their functional status and demonstrates the ability to make significant improvements in function in a reasonable and predictable amount of time.  Equipment Recommendations  None recommended by OT       Precautions / Restrictions Precautions Precautions: Fall Restrictions Weight Bearing Restrictions: No      Mobility Bed Mobility Overal bed mobility: Needs Assistance Bed Mobility: Rolling Rolling: Total assist            ADL either performed or assessed with clinical judgement   ADL   Eating/Feeding: Moderate assistance;Bed level;Cueing for sequencing Eating/Feeding Details (indicate cue type and reason): She required initial hand-over-hand assist with increased time for task, as well as cues for initiation and sustained attention             Upper Body Dressing : Total assistance;Bed level   Lower Body Dressing: Bed level;Total assistance       Toileting- Clothing Manipulation and Hygiene: Total assistance;Bed level Toileting - Clothing Manipulation Details (indicate cue type and reason): based on clinical judgement            Pertinent Vitals/Pain Pain Assessment Pain Assessment: No/denies pain (she stated "no" when asked if she was hurting)  Hand Dominance Right   Extremity/Trunk Assessment Upper Extremity Assessment Upper Extremity Assessment: RUE deficits/detail;LUE deficits/detail RUE Deficits / Details: She required AAROM for elbow and shoulder. She performed a closed fist with her hand LUE Deficits / Details: She required AAROM for elbow and shoulder. She performed a closed fist with her hand   Lower Extremity Assessment RLE Deficits / Details: She required PROM as she did not initiate ROM to command LLE Deficits / Details: She required PROM as she did not initiate ROM to command       Communication Communication Communication: Expressive difficulties;Receptive difficulties   Cognition Arousal/Alertness: Awake/alert Behavior During Therapy: Flat affect Overall Cognitive Status: History of cognitive impairments - at baseline Area of Impairment: Orientation, Memory, Following commands, Problem solving                 Orientation Level: Disoriented to, Place, Time, Situation   Memory: Decreased short-term memory       Problem Solving: Decreased initiation, Difficulty sequencing, Requires verbal cues, Requires tactile cues General Comments: She followed simple 1 step commands with increased time and occasional repetition            Home Living Family/patient expects to be discharged to:: Unsure Living Arrangements: Spouse/significant other            Prior Functioning/Environment         ADLs Comments: The pt was unable to provide information regarding her prior level of functioning and living situation, due to impaired cognition. Per the pt's niece who was present, the pt has gradually declined cognitively and physically over the past couple years. Up until recently, the pt could stand, feed herself, and ambulate very short distances.        OT Problem List: Decreased strength;Decreased range of motion;Decreased activity tolerance;Impaired balance (sitting and/or standing);Decreased  cognition;Decreased knowledge of use of DME or AE      OT Treatment/Interventions: Self-care/ADL training;Therapeutic exercise;Energy conservation;DME and/or AE instruction;Patient/family education;Therapeutic activities;Cognitive remediation/compensation;Visual/perceptual remediation/compensation    OT Goals(Current goals can be found in the care plan section) Acute Rehab OT Goals Patient Stated Goal: The pt was unable to state OT Goal Formulation: Patient unable to participate in goal setting Time For Goal Achievement: 07/16/22 Potential to Achieve Goals: Fair ADL Goals Pt Will Perform Eating: with set-up;with supervision;sitting Pt Will Perform Grooming: with supervision;with set-up;sitting Pt Will Perform Upper Body Bathing: with min assist;sitting Pt Will Perform Upper Body Dressing: with min assist;sitting Pt Will Transfer to Toilet: with min assist;bedside commode;stand pivot transfer  OT Frequency: Min 2X/week       AM-PAC OT "6 Clicks" Daily Activity     Outcome Measure Help from another person eating meals?: A Lot Help from another person taking care of personal grooming?: A Lot Help from another person toileting, which includes using toliet, bedpan, or urinal?: Total Help from another person bathing (including washing, rinsing, drying)?: Total Help from another person to put on and taking off regular upper body clothing?: A Lot Help from another person to put on and taking off regular lower body clothing?: Total 6 Click Score: 9   End of Session Nurse Communication: Other (comment) (General overview of pt's abilities during session)  Activity Tolerance: Other (comment) (Fair overall tolerance) Patient left: in bed;with call bell/phone within reach;with bed alarm set;with family/visitor present  OT Visit Diagnosis: Muscle weakness (generalized) (M62.81);History of falling (Z91.81);Feeding difficulties (R63.3)                Time: 8676-1950 OT  Time Calculation (min): 29  min Charges:  OT General Charges $OT Visit: 1 Visit OT Evaluation $OT Eval Moderate Complexity: 1 Mod OT Treatments $Self Care/Home Management : 8-22 mins    Reuben Likes, OTR/L 07/02/2022, 3:52 PM

## 2022-07-03 ENCOUNTER — Other Ambulatory Visit: Payer: Self-pay

## 2022-07-03 DIAGNOSIS — N3000 Acute cystitis without hematuria: Secondary | ICD-10-CM | POA: Diagnosis not present

## 2022-07-03 DIAGNOSIS — E43 Unspecified severe protein-calorie malnutrition: Secondary | ICD-10-CM | POA: Insufficient documentation

## 2022-07-03 DIAGNOSIS — R531 Weakness: Secondary | ICD-10-CM | POA: Diagnosis not present

## 2022-07-03 DIAGNOSIS — R627 Adult failure to thrive: Secondary | ICD-10-CM | POA: Diagnosis not present

## 2022-07-03 LAB — CBC WITH DIFFERENTIAL/PLATELET
Abs Immature Granulocytes: 0.02 10*3/uL (ref 0.00–0.07)
Basophils Absolute: 0 10*3/uL (ref 0.0–0.1)
Basophils Relative: 0 %
Eosinophils Absolute: 0 10*3/uL (ref 0.0–0.5)
Eosinophils Relative: 1 %
HCT: 37.2 % (ref 36.0–46.0)
Hemoglobin: 11.7 g/dL — ABNORMAL LOW (ref 12.0–15.0)
Immature Granulocytes: 0 %
Lymphocytes Relative: 17 %
Lymphs Abs: 1.1 10*3/uL (ref 0.7–4.0)
MCH: 25.5 pg — ABNORMAL LOW (ref 26.0–34.0)
MCHC: 31.5 g/dL (ref 30.0–36.0)
MCV: 81.2 fL (ref 80.0–100.0)
Monocytes Absolute: 0.5 10*3/uL (ref 0.1–1.0)
Monocytes Relative: 8 %
Neutro Abs: 4.9 10*3/uL (ref 1.7–7.7)
Neutrophils Relative %: 74 %
Platelets: 199 10*3/uL (ref 150–400)
RBC: 4.58 MIL/uL (ref 3.87–5.11)
RDW: 15.9 % — ABNORMAL HIGH (ref 11.5–15.5)
WBC: 6.5 10*3/uL (ref 4.0–10.5)
nRBC: 0 % (ref 0.0–0.2)

## 2022-07-03 LAB — URINE CULTURE: Culture: >=100000 — AB

## 2022-07-03 LAB — BASIC METABOLIC PANEL
Anion gap: 8 (ref 5–15)
BUN: 10 mg/dL (ref 8–23)
CO2: 26 mmol/L (ref 22–32)
Calcium: 8.7 mg/dL — ABNORMAL LOW (ref 8.9–10.3)
Chloride: 104 mmol/L (ref 98–111)
Creatinine, Ser: 0.61 mg/dL (ref 0.44–1.00)
GFR, Estimated: >60 mL/min (ref >60)
Glucose, Bld: 90 mg/dL (ref 70–99)
Potassium: 3.6 mmol/L (ref 3.5–5.1)
Sodium: 138 mmol/L (ref 135–145)

## 2022-07-03 LAB — MAGNESIUM: Magnesium: 1.7 mg/dL (ref 1.7–2.4)

## 2022-07-03 NOTE — TOC Progression Note (Signed)
Transition of Care [facility]) - Progression Note    Patient Details  Name: Donna Simmons MRN: [id number] Date of Birth: 1940-11-29  Transition of Care [facility]) CM/SW Contact  Amare Kontos, Juliann Pulse, RN Phone Number: 07/03/2022, 11:18 AM  Clinical Narrative: Will provide bed offers, await choice prior auth.    1. 1.5 mi [facility] [place], [place] [address] [phone] Overall rating Much below average 2. 1.9 mi [facility] [place] [address] [place], [place] [address] [phone] Overall rating Average 3. 2 mi [facility] [address] [place], [place] [address] [phone] Overall rating Much below average 4. 2.4 mi [facility] [place], [place] [address] [phone] Overall rating Above average 5. 2.6 mi [facility] [place], [place] [address] [phone] Overall rating Much below average 6. 3.4 mi [place], [place] [address] [phone] Overall rating Much below average 7. 3.7 mi [facility] [place], [place] [address] [phone] Overall rating Much above average 8. 3.9 mi [facility] [place], [place] [address] [phone] Overall rating Above average 9. [place], [place]  [address] [phone] Overall rating Below average 10. 4.2 mi [facility] [address] [place], [place] [address] [phone] Overall rating Below average 11. 4.6 mi [facility] [address] [place], [place] [address] [phone] Overall rating Much below average 12. 6.3 mi [facility] [address] [place], [place] [address] [phone] Overall rating Above average 13. 9.2 mi [place], [place] [address] [phone] Overall rating Below average 14. 9.6 [place], [place] [address] [phone] Overall rating Much above average 15. 9.8 mi [facility] [address] [place], [place] [address] [phone] Overall rating Above average 16. 9.9 mi [facility] [address] [place], [place] [address] [phone] Overall rating Much above average 17. 10.7 mi [place] [facility] [address] [place], [place] [address] [phone] Overall rating Much above average 18. 13.3 [facility] [address] [place], [place] [address] [phone] Overall rating Much below average 19. 13.6 mi [facility] [address] [place], [place] [address] [phone] Overall rating Much below average 20. 14.2 mi [facility] [facility] [place], [place] [address] [phone] Overall rating Much above average 21. 14.4 [place] [address] [place], [place] [address] [phone] Overall rating Much below average 22. 15 mi [facility] [place], [place] [address] [phone] Overall rating Above average 23. 15.2 mi [facility] [address], [place] [address] [phone] Overall rating Average 24. 15.3 mi [facility] [place] [address] [place], [place] [address] [phone] Overall rating Average 25. 16.9 mi [facility] [place], [place] [address] [phone] Overall rating Above average 26. 18.1 [place] [address] [place], [place] [address] [phone] Overall rating  Average 27. 18.9 mi [facility] [address] [place], [place] [address] [phone] Overall rating Much below average 28. 19.7 mi [facility] and [facility] [address] [place], [place] [address] [phone] Overall rating Below average 29. 20.2 mi [address] at the [facility] [place], [place] [address] [phone] Overall rating Much above average 30. 20.4 mi [facility] [address] [place], [place] [address] [phone] Overall rating Much below average 31. 20.4 mi [facility] [address] [place], [place] [address] [phone] Overall rating Much below average 32. 20.6 [facility] [address] [place], [place] [address] [phone] Overall rating Much above average 33. 21.2 [address] [place], [place] [address] [phone] Overall rating Average 34. 22.7 mi [facility] [place] [address] [place], [place] [address] [phone] Overall rating Below average 35. 22.8 mi [employer] [address] [place], [place] [address] [phone] Overall rating Much above average 36. 23.1 mi [facility] and [facility] [address] [place], [place] [address] [phone] Overall rating Average 37. 23.5 mi [facility] [address] [place], [place] [address] [phone] Overall rating Above average 38. 23.7 [facility] [address] [place], [place] [address] [phone] Overall rating Not available18 39. 24 mi [facility] [address] [place], [place] [address] [phone] Overall rating Much below average 40. 24.8 mi [facility] [employer] [address] [place], [place] [address] [phone] Overall rating Above average To e  Expected Discharge Plan: Skilled Nursing Facility Barriers to Discharge: Continued Medical Work up  Expected Discharge Plan and Services Expected Discharge Plan: Skilled Nursing Facility   Discharge Planning Services: CM Consult Post Acute Care Choice: Skilled Nursing Facility Living arrangements for the past 2 months: Single Family Home                                       Social Determinants of Health (SDOH) Interventions    Readmission Risk Interventions     No data to display

## 2022-07-03 NOTE — Care Management Important Message (Signed)
Important Message  Patient Details IM Letter placed in Patients room. Name: Donna Simmons MRN: [id number] Date of Birth: 10-25-1940   Medicare Important Message Given:  Yes     Kerin Salen 07/03/2022, 2:44 PM

## 2022-07-03 NOTE — Progress Notes (Signed)
[facility]                                                                               Donna Simmons, is a 81 y.o. female, DOB - 02-Sep-1940, GYF:[id number] Admit date - 06/30/2022    Outpatient Primary MD for the patient is [name], DO  LOS - 3  days    Brief summary   Donna Simmons is a 81 year old female with PMH Alzheimer's dementia with behavioral disturbance, permanent A-fib, HTN, HLD who presented with worsening weakness and falls over the past approximately 2 months. UA was positive for pyuria and empiric Rocephin was initiated for presented UTI.  Due to failure to thrive and recurrent falls, EDP requested admission.  The patient was admitted by [facility], hospitalist service.    Assessment & Plan    Assessment and Plan:  Generalized weakness, multifactorial secondary to advanced dementia, poor oral intake, dehydration, presumptive UTI. - continue with IV fluids and IV antibiotics, IV rocephin .  - Follow up the urine and blood cultures report.  Urine cultures show E coli sensitive to augmentin.  - therapy eval recommending SNF.   - currently on rocephin. Transition to oral antibiotics on discharge.     Failure to thrive in an adult:  PT therapy eval recommending SNF.    Severe protein calorie malnutrition;  Dietary consulted.     Permanent Atrial Fibrillation;  Rate controlled with toprol XL  Resume eliquis for CVA PREVENTION.     Hyperlipidemia: resume Crestor.    GERD:  Resume PPI.     RN Pressure Injury Documentation: Pressure Injury 07/01/22 Coccyx Right;Left Stage 2 -  Partial thickness loss of dermis presenting as a shallow open injury with a red, pink wound bed without slough. (Active)  07/01/22 1405  Location: Coccyx  Location Orientation: Right;Left  Staging: Stage 2 -  Partial thickness loss of dermis presenting as a shallow open injury with a red, pink wound bed without slough.  Wound Description (Comments):   Present  on Admission: Yes  Dressing Type Foam - Lift dressing to assess site every shift 07/02/22 2130   Wound care consulted and recommendations given.   Interventions: Ensure Enlive (each supplement provides 350kcal and 20 grams of protein), Liberalize Diet  Estimated body mass index is 20.24 kg/m as calculated from the following:   Height as of this encounter: 5\' 2"  (1.575 m).   Weight as of this encounter: 50.2 kg.  Code Status: full code.  DVT Prophylaxis:   apixaban (ELIQUIS) tablet 5 mg   Level of Care: Level of care: Med-Surg Family Communication: none at bedside.   Disposition Plan:     Remains inpatient appropriate:    Procedures:  None.   Consultants:   Palliative care.   Antimicrobials:   Anti-infectives (From admission, onward)    Start     Dose/Rate Route Frequency Ordered Stop   07/01/22 1000  cefTRIAXone (ROCEPHIN) 2 g in sodium chloride 0.9 % 100 mL IVPB        2 g 200 mL/hr over 30 Minutes Intravenous Every 24 hours 06/30/22 2329 07/05/22 0959   06/30/22 2245  cefTRIAXone (ROCEPHIN) 1 g in  sodium chloride 0.9 % 100 mL IVPB        1 g 200 mL/hr over 30 Minutes Intravenous  Once 06/30/22 2236 07/01/22 0015        Medications  Scheduled Meds:  apixaban  5 mg Oral BID   feeding supplement  237 mL Oral BID BM   metoprolol succinate  12.5 mg Oral Daily   pantoprazole  40 mg Oral Daily   Continuous Infusions:  cefTRIAXone (ROCEPHIN)  IV 2 g (07/03/22 0925)   PRN Meds:.acetaminophen, melatonin, polyethylene glycol, prochlorperazine    Subjective:   Donna Simmons was seen and examined today.  No new complaints.   Objective:   Vitals:   07/02/22 1336 07/02/22 1933 07/03/22 0429 07/03/22 0900  BP: 118/72 118/77 (!) 134/97   Pulse: 68 73 99   Resp: 19 20 20    Temp: 98 F (36.7 C) 98.2 F (36.8 C) 97.9 F (36.6 C)   TempSrc: Oral Oral Oral   SpO2: 100%  (!) 83% 100%  Weight:    50.2 kg  Height:    5\' 2"  (1.575 m)    Intake/Output Summary  (Last 24 hours) at 07/03/2022 0946 Last data filed at 07/03/2022 0900 Gross per 24 hour  Intake 1091.66 ml  Output 500 ml  Net 591.66 ml    Filed Weights   07/03/22 0900  Weight: 50.2 kg     Exam General exam: Appears calm and comfortable  Respiratory system: Clear to auscultation. Respiratory effort normal. Cardiovascular system: S1 & S2 heard, RRR. No JVD, . No pedal edema. Gastrointestinal system: Abdomen is nondistended, soft and nontender.  Normal bowel sounds heard. Central nervous system: Alert and oriented. No focal neurological deficits. Extremities: Symmetric 5 x 5 power. Skin: No rashes, lesions or ulcers Psychiatry:  Mood & affect appropriate.    Data Reviewed:  I have personally reviewed following labs and imaging studies   CBC Lab Results  Component Value Date   WBC 6.5 07/03/2022   RBC 4.58 07/03/2022   HGB 11.7 (L) 07/03/2022   HCT 37.2 07/03/2022   MCV 81.2 07/03/2022   MCH 25.5 (L) 07/03/2022   PLT 199 07/03/2022   MCHC 31.5 07/03/2022   RDW 15.9 (H) 07/03/2022   LYMPHSABS 1.1 07/03/2022   MONOABS 0.5 07/03/2022   EOSABS 0.0 07/03/2022   BASOSABS 0.0 07/03/2022     Last metabolic panel Lab Results  Component Value Date   NA 138 07/03/2022   K 3.6 07/03/2022   CL 104 07/03/2022   CO2 26 07/03/2022   BUN 10 07/03/2022   CREATININE 0.61 07/03/2022   GLUCOSE 90 07/03/2022   GFRNONAA >60 07/03/2022   GFRAA 62 04/04/2020   CALCIUM 8.7 (L) 07/03/2022   PHOS 3.2 07/01/2022   PROT 5.6 (L) 07/01/2022   ALBUMIN 2.8 (L) 07/01/2022   LABGLOB 2.6 04/04/2020   AGRATIO 1.7 04/04/2020   BILITOT 1.0 07/01/2022   ALKPHOS 40 07/01/2022   AST 65 (H) 07/01/2022   ALT 23 07/01/2022   ANIONGAP 8 07/03/2022    CBG (last 3)  No results for input(s): "GLUCAP" in the last 72 hours.    Coagulation Profile: No results for input(s): "INR", "PROTIME" in the last 168 hours.   Radiology Studies: No results found.     07/03/2022 M.D. [facility] 07/03/2022, 9:46 AM  Available via Epic secure chat 7am-7pm After 7 pm, please refer to night coverage provider listed on amion.

## 2022-07-04 DIAGNOSIS — R531 Weakness: Secondary | ICD-10-CM | POA: Diagnosis not present

## 2022-07-04 DIAGNOSIS — R627 Adult failure to thrive: Secondary | ICD-10-CM | POA: Diagnosis not present

## 2022-07-04 DIAGNOSIS — N3000 Acute cystitis without hematuria: Secondary | ICD-10-CM | POA: Diagnosis not present

## 2022-07-04 LAB — CBC WITH DIFFERENTIAL/PLATELET
Abs Immature Granulocytes: 0.01 10*3/uL (ref 0.00–0.07)
Basophils Absolute: 0 10*3/uL (ref 0.0–0.1)
Basophils Relative: 0 %
Eosinophils Absolute: 0 10*3/uL (ref 0.0–0.5)
Eosinophils Relative: 1 %
HCT: 38 % (ref 36.0–46.0)
Hemoglobin: 12.1 g/dL (ref 12.0–15.0)
Immature Granulocytes: 0 %
Lymphocytes Relative: 23 %
Lymphs Abs: 1 10*3/uL (ref 0.7–4.0)
MCH: 25.9 pg — ABNORMAL LOW (ref 26.0–34.0)
MCHC: 31.8 g/dL (ref 30.0–36.0)
MCV: 81.4 fL (ref 80.0–100.0)
Monocytes Absolute: 0.3 10*3/uL (ref 0.1–1.0)
Monocytes Relative: 6 %
Neutro Abs: 3.1 10*3/uL (ref 1.7–7.7)
Neutrophils Relative %: 70 %
Platelets: 234 10*3/uL (ref 150–400)
RBC: 4.67 MIL/uL (ref 3.87–5.11)
RDW: 15.8 % — ABNORMAL HIGH (ref 11.5–15.5)
WBC: 4.4 10*3/uL (ref 4.0–10.5)
nRBC: 0 % (ref 0.0–0.2)

## 2022-07-04 LAB — BASIC METABOLIC PANEL
Anion gap: 10 (ref 5–15)
BUN: 10 mg/dL (ref 8–23)
CO2: 27 mmol/L (ref 22–32)
Calcium: 8.5 mg/dL — ABNORMAL LOW (ref 8.9–10.3)
Chloride: 101 mmol/L (ref 98–111)
Creatinine, Ser: 0.73 mg/dL (ref 0.44–1.00)
GFR, Estimated: >60 mL/min (ref >60)
Glucose, Bld: 87 mg/dL (ref 70–99)
Potassium: 3.7 mmol/L (ref 3.5–5.1)
Sodium: 138 mmol/L (ref 135–145)

## 2022-07-04 LAB — MAGNESIUM: Magnesium: 2 mg/dL (ref 1.7–2.4)

## 2022-07-04 NOTE — Progress Notes (Signed)
Occupational Therapy Treatment Patient Details Name: Donna Simmons MRN: [id number] DOB: [birth year] Today's Date: 07/04/2022   History of present illness Donna Simmons is a 81 y.o. female with medical history significant for late onset Alzheimer's dementia with behavioral disturbance, followed by gerontology, permanent atrial fibrillation on Eliquis, essential hypertension, hyperlipidemia, who presented from home due to failure to thrive and frequent falls for the past 2 months.  Family endorses poor oral intake, generalized weakness, and progressive difficulty with ambulation.  [name], landing on her knees.     In the ED, imaging were negative for fracture.  She was noted to be dehydrated with electrolytes abnormalities.  She received IV fluid hydration normal saline 500 cc/h x 1.  UA was positive for pyuria and empiric Rocephin was initiated for presented UTI.  Due to failure to thrive and recurrent falls, EDP requested admission.  The patient was admitted by [facility], hospitalist service.   OT comments  Patient demonstrates improved ability to follow commands and participate in daily tasks. Patient able to wash her face and feed herself after setup. She needs intermittent supervision with meals - to point out food she didn't notice. Patient max assist for supine to sit and max pivot to recliner. She did attempt to scoot to edge of bed when therapist asked her too. Continue to recommend short term rehab to improve functional abilities and reduce caregiver burden.    Recommendations for follow up therapy are one component of a multi-disciplinary discharge planning process, led by the attending physician.  Recommendations may be updated based on patient status, additional functional criteria and insurance authorization.    Follow Up Recommendations  Skilled nursing-short term rehab (<3 hours/day)    Assistance Recommended at Discharge Frequent or constant  Supervision/Assistance  Patient can return home with the following  Assistance with cooking/housework;Direct supervision/assist for financial management;Assistance with feeding;Assist for transportation;Direct supervision/assist for medications management;A lot of help with walking and/or transfers;A lot of help with bathing/dressing/bathroom   Equipment Recommendations  None recommended by OT    Recommendations for Other Services      Precautions / Restrictions Precautions Precautions: Fall Precaution Comments: multiple falls recently per H&P, pt unable to give falls hx 2* dementia Restrictions Weight Bearing Restrictions: No       Mobility Bed Mobility                    Transfers                         Balance Overall balance assessment: Needs assistance Sitting-balance support: No upper extremity supported, Feet supported Sitting balance-Leahy Scale: Fair     Standing balance support: During functional activity Standing balance-Leahy Scale: Poor                             ADL either performed or assessed with clinical judgement   ADL Overall ADL's : Needs assistance/impaired Eating/Feeding: Set up;Sitting;Supervision/ safety Eating/Feeding Details (indicate cue type and reason): Patient able to drink from cup and use a fork to feed herself sitting in recilner. Supervision needed to cue patient to food items she doesn't notice Grooming: Set up;Wash/dry face;Wash/dry hands;Sitting Grooming Details (indicate cue type and reason): washed her face and hands in seated position.  Functional mobility during ADLs: Maximal assistance General ADL Comments: Max assist to transfer to edge of bed. Increased time seated at edge of bed to work on improving sitting balance and reduce patient's trunkal posterior lean. Patient unable to stand with walker and required max squat pivot to recliner.    Extremity/Trunk  Assessment              Vision   Vision Assessment?: No apparent visual deficits   Perception     Praxis      Cognition Arousal/Alertness: Awake/alert Behavior During Therapy: WFL for tasks assessed/performed Overall Cognitive Status: History of cognitive impairments - at baseline                                          Exercises      Shoulder Instructions       General Comments      Pertinent Vitals/ Pain       Pain Assessment Pain Assessment: No/denies pain  Home Living                                          Prior Functioning/Environment              Frequency  Min 2X/week        Progress Toward Goals  OT Goals(current goals can now be found in the care plan section)  Progress towards OT goals: Progressing toward goals  Acute Rehab OT Goals OT Goal Formulation: Patient unable to participate in goal setting Time For Goal Achievement: 07/16/22 Potential to Achieve Goals: [place] Discharge plan remains appropriate    Co-evaluation                 AM-PAC OT "6 Clicks" Daily Activity     Outcome Measure   Help from another person eating meals?: A Little Help from another person taking care of personal grooming?: A Little Help from another person toileting, which includes using toliet, bedpan, or urinal?: Total Help from another person bathing (including washing, rinsing, drying)?: A Lot Help from another person to put on and taking off regular upper body clothing?: A Lot Help from another person to put on and taking off regular lower body clothing?: Total 6 Click Score: 12    End of Session Equipment Utilized During Treatment: Gait belt  OT Visit Diagnosis: Muscle weakness (generalized) (M62.81);History of falling (Z91.81);Feeding difficulties (R63.3)   Activity Tolerance Patient tolerated treatment well   Patient Left in chair;with call bell/phone within reach;with chair alarm set   Nurse  Communication Mobility status        Time: 1610-9604 OT Time Calculation (min): 27 min  Charges: OT General Charges $OT Visit: 1 Visit OT Treatments $Self Care/Home Management : 23-37 mins  Gustavo Lah, OTR/[facility] [place]  Office [phone]   Lenward Chancellor 07/04/2022, 12:41 PM

## 2022-07-04 NOTE — Consult Note (Signed)
Consultation Note Date: 07/04/2022   Patient Name: Donna Simmons  DOB: [birth year]  MRN: [id number]  Age / Sex: 81 y.o., female  PCP: [name], DO Referring Physician: Hosie Poisson, MD  Reason for Consultation: Establishing goals of care  HPI/Patient Profile: 81 y.o. female  admitted on 06/30/2022    Clinical Assessment and Goals of Care: 81 year old lady, known to palliative service from previous consultation, history of dementia permanent A-fib hypertension, admitted with weakness and falls to hospital medicine service, placed on antibiotics for possible urinary tract infection, palliative consulted due to failure to thrive and recurrent falls, generalized weakness. Palliative consult request received, chart reviewed, patient seen and examined, call placed and discussed with spouse. Palliative medicine is specialized medical care for people living with serious illness. It focuses on providing relief from the symptoms and stress of a serious illness. The goal is to improve quality of life for both the patient and the family. Goals of care: Broad aims of medical therapy in relation to the patient's values and preferences. Our aim is to provide medical care aimed at enabling patients to achieve the goals that matter most to them, given the circumstances of their particular medical situation and their constraints.    NEXT OF KIN Spouse Donna Simmons [phone], they have been married for over 22 years.  Patient is noted to be a graduate from [employer] program and then was had of gerontology program and community medicine at [place].  SUMMARY OF RECOMMENDATIONS   Full code/full scope care SNF rehab with palliative-call placed discussed with spouse, he prefers for patient to go to [facility] facility, will touch base with TOC and TRH MD Thank you for the consult Code  Status/Advance Care Planning: Full code   Symptom Management:     Palliative Prophylaxis:  Bowel Regimen  Additional Recommendations (Limitations, Scope, Preferences): Full Scope Treatment  Psycho-social/Spiritual:  Desire for further Chaplaincy support:yes Additional Recommendations: Caregiving  Support/Resources  Prognosis:  Unable to determine  Discharge Planning: [place] for rehab with Palliative care service follow-up      Primary Diagnoses: Present on Admission: **None**   I have reviewed the medical record, interviewed the patient and family, and examined the patient. The following aspects are pertinent.  Past Medical History:  Diagnosis Date   Allergy    GERD (gastroesophageal reflux disease)    Heart murmur    No significant valvular lesion noted on echo.   Hypercholesteremia    Hypertension    Memory loss    Paroxysmal atrial fibrillation ([place]) 12/27/2008   Social History   Socioeconomic History   Marital status: Married    Spouse name: Not on file   Number of children: Not on file   Years of education: Not on file   Highest education level: Not on file  Occupational History   Not on file  Tobacco Use   Smoking status: Never   Smokeless tobacco: Never  Vaping Use   Vaping Use: Never used  Substance and  Sexual Activity   Alcohol use: No    Alcohol/week: 0.0 standard drinks of alcohol   Drug use: No   Sexual activity: Yes  Other Topics Concern   Not on file  Social History Narrative   Married with no children. Walks several times a week at least 3-4 times a week about 2 miles a time. Does not smoke and does not drink.   Social Determinants of Health   Financial Resource Strain: Not on file  Food Insecurity: Unknown (07/03/2022)   Hunger Vital Sign    Worried About Running Out of Food in the Last Year: Patient refused    Ran Out of Food in the Last Year: Patient refused  Transportation Needs: Not on file  Physical  Activity: Not on file  Stress: Not on file  Social Connections: Not on file   Family History  Problem Relation Age of Onset   Arthritis Mother        OSTEO   Hypertension Mother    Dementia Mother    Heart disease Father    Hypertension Sister    Dementia Sister    Cancer - Lung Brother    Cancer Maternal Grandmother    Heart disease Maternal Grandfather    Cancer - Lung Brother    Hypertension Brother    Cancer - Prostate Brother    Scheduled Meds:  apixaban  5 mg Oral BID   feeding supplement  237 mL Oral BID BM   metoprolol succinate  12.5 mg Oral Daily   pantoprazole  40 mg Oral Daily   Continuous Infusions: PRN Meds:.acetaminophen, melatonin, polyethylene glycol, prochlorperazine Medications Prior to Admission:  Prior to Admission medications   Medication Sig Start Date End Date Taking? Authorizing Provider  apixaban (ELIQUIS) 5 MG TABS tablet Take 1 tablet by mouth twice daily Patient taking differently: Take 5 mg by mouth 2 (two) times daily. 08/08/21  Yes Leonie Man, MD  Cholecalciferol (VITAMIN D-3 PO) Take 1 capsule by mouth daily.   Yes [provider]  Ensure (ENSURE) Take 237 mLs by mouth 2 (two) times daily as needed (meal supplement). Vanilla   Yes [provider]  flecainide (TAMBOCOR) 50 MG tablet TAKE 1 TABLET BY MOUTH TWICE DAILY. APPOINTMENT REQUIRED FOR FUTURE REFILLS Patient taking differently: Take 50 mg by mouth 2 (two) times daily. 01/15/22  Yes Leonie Man, MD  omeprazole (PRILOSEC) 40 MG capsule Take 40 mg by mouth daily.   Yes [provider]  OVER THE COUNTER MEDICATION Take 1 tablet by mouth daily. OTC Potassium supplement..   Yes [provider]  metoprolol succinate (TOPROL-XL) 50 MG 24 hr tablet TAKE 1 TABLET BY MOUTH ONCE DAILY WITH  OR  IMMEDIATELY  FOLLOWING  A  MEAL 01/07/22   Leonie Man, MD  rosuvastatin (CRESTOR) 20 MG tablet Take 1 tablet by mouth once daily Patient not taking: Reported  on 07/01/2022 08/08/20   Leonie Man, MD   Allergies  Allergen Reactions   Antihistamines, Chlorpheniramine-Type Palpitations    Tachycardia    Review of Systems Denies pain Physical Exam Thin appearing lady, resting in a chair Regular work of breathing Abdomen is not distended No edema Mood and affect within normal limits  Vital Signs: BP (!) 141/79 (BP Location: Left Arm)   Pulse 83   Temp (!) 97.5 F (36.4 C)   Resp 16   Ht 5\' 2"  (1.575 m)   Wt 50.2 kg   SpO2 93%  BMI 20.24 kg/m  Pain Scale: 0-10 POSS *See Group Information*: 1-Acceptable,Awake and alert Pain Score: 0-No pain   SpO2: SpO2: 93 % O2 Device:SpO2: 93 % O2 Flow Rate: .   IO: Intake/output summary:  Intake/Output Summary (Last 24 hours) at 07/04/2022 1252 Last data filed at 07/04/2022 1000 Gross per 24 hour  Intake 360 ml  Output 1650 ml  Net -1290 ml    LBM: Last BM Date :  (prior to admission) Baseline Weight: Weight: 50.2 kg Most recent weight: Weight: 50.2 kg     Palliative Assessment/Data:   Palliative performance scale 60%  Time In: 11.30 Time Out: 12.30 Time Total: 60 Greater than 50%  of this time was spent counseling and coordinating care related to the above assessment and plan.  Signed by: Loistine Chance, MD   Please contact Palliative Medicine Team phone at [phone] for questions and concerns.  For individual provider: See Shea Evans

## 2022-07-04 NOTE — Progress Notes (Signed)
Physical Therapy Treatment Patient Details Name: Donna Simmons MRN: [id number] DOB: 06/22/1941 Today's Date: 07/04/2022   History of Present Illness Donna Simmons is a 81 y.o. female with medical history significant for late onset Alzheimer's dementia with behavioral disturbance, followed by gerontology, permanent atrial fibrillation on Eliquis, essential hypertension, hyperlipidemia, who presented from home due to failure to thrive and frequent falls for the past 2 months.  Family endorses poor oral intake, generalized weakness, and progressive difficulty with ambulation.  [place] the day prior to presentation, landing on her knees.     In the ED, imaging were negative for fracture.  She was noted to be dehydrated with electrolytes abnormalities.  She received IV fluid hydration normal saline 500 cc/h x 1.  UA was positive for pyuria and empiric Rocephin was initiated for presented UTI.  Due to failure to thrive and recurrent falls, EDP requested admission.  The patient was admitted by [facility], hospitalist service.    PT Comments    +2 total assist for supine to sit, +2 max assist sit to stand, pt ambulated 12' with hand held assist of 2 with shuffling gait, distance limited by fatigue. Noted pt's bed linen was stained with urine, RN/NT notified.    Recommendations for follow up therapy are one component of a multi-disciplinary discharge planning process, led by the attending physician.  Recommendations may be updated based on patient status, additional functional criteria and insurance authorization.  Follow Up Recommendations  Skilled nursing-short term rehab (<3 hours/day) Can patient physically be transported by private vehicle: No   Assistance Recommended at Discharge Frequent or constant Supervision/Assistance  Patient can return home with the following Two people to help with walking and/or transfers;A lot of help with bathing/dressing/bathroom;Assistance with cooking/housework;Direct  supervision/assist for medications management;Assist for transportation;Help with stairs or ramp for entrance;Direct supervision/assist for financial management   Equipment Recommendations  None recommended by PT    Recommendations for Other Services       Precautions / Restrictions Precautions Precautions: Fall Precaution Comments: multiple falls recently per H&P, pt unable to give falls hx 2* dementia Restrictions Weight Bearing Restrictions: No     Mobility  Bed Mobility Overal bed mobility: Needs Assistance Bed Mobility: Supine to Sit     Supine to sit: +2 for physical assistance, Total assist Sit to supine: Total assist, +2 for physical assistance   General bed mobility comments: pt unable to follow commands for techique 2* dementia    Transfers Overall transfer level: Needs assistance Equipment used: Rolling walker (2 wheels) Transfers: Sit to/from Stand Sit to Stand: Max assist, +2 safety/equipment           General transfer comment: assist to power up and for posterior lean in standing    Ambulation/Gait Ambulation/Gait assistance: Mod assist, +2 physical assistance, +2 safety/equipment Gait Distance (Feet): 12 Feet Assistive device: 2 person hand held assist Gait Pattern/deviations: Step-through pattern, Shuffle, Narrow base of support Gait velocity: decr     General Gait Details: assist for posterior lean, shuffling gait   Stairs             Wheelchair Mobility    Modified Rankin (Stroke Patients Only)       Balance Overall balance assessment: Needs assistance Sitting-balance support: No upper extremity supported, Feet supported Sitting balance-Leahy Scale: Fair     Standing balance support: During functional activity Standing balance-Leahy Scale: Poor  Cognition Arousal/Alertness: Awake/alert Behavior During Therapy: WFL for tasks assessed/performed Overall Cognitive Status: History of  cognitive impairments - at baseline Area of Impairment: Orientation, Memory, Following commands, Problem solving                 Orientation Level: Disoriented to, Place, Time, Situation   Memory: Decreased short-term memory Following Commands: Follows one step commands inconsistently     Problem Solving: Decreased initiation, Difficulty sequencing, Requires verbal cues, Requires tactile cues General Comments: She followed simple 1 step commands with increased time and occasional repetition        Exercises      General Comments        Pertinent Vitals/Pain Pain Assessment Faces Pain Scale: No hurt Breathing: normal Negative Vocalization: none Facial Expression: smiling or inexpressive Body Language: relaxed Consolability: no need to console PAINAD Score: 0    Home Living                          Prior Function            PT Goals (current goals can now be found in the care plan section) Acute Rehab PT Goals PT Goal Formulation: Patient unable to participate in goal setting Time For Goal Achievement: 07/15/22 Potential to Achieve Goals: Fair Progress towards PT goals: Progressing toward goals    Frequency    Min 2X/week      PT Plan Current plan remains appropriate    Co-evaluation              AM-PAC PT "6 Clicks" Mobility   Outcome Measure  Help needed turning from your back to your side while in a flat bed without using bedrails?: Total Help needed moving from lying on your back to sitting on the side of a flat bed without using bedrails?: Total Help needed moving to and from a bed to a chair (including a wheelchair)?: Total Help needed standing up from a chair using your arms (e.g., wheelchair or bedside chair)?: Total Help needed to walk in hospital room?: A Lot Help needed climbing 3-5 steps with a railing? : Total 6 Click Score: 7    End of Session Equipment Utilized During Treatment: Gait belt Activity Tolerance:  Patient limited by fatigue Patient left: in bed;with bed alarm set;with family/visitor present;with call bell/phone within reach Nurse Communication: Mobility status;Other (comment) (bed linen is soiled) PT Visit Diagnosis: Unsteadiness on feet (R26.81);Difficulty in walking, not elsewhere classified (R26.2);History of falling (Z91.81);Repeated falls (R29.6);Adult, failure to thrive (R62.7)     Time: 1421-1430 PT Time Calculation (min) (ACUTE ONLY): 9 min  Charges:  $Gait Training: 8-22 mins                    Ralene Bathe Kistler PT 07/04/2022  Acute Rehabilitation Services  Office [phone]

## 2022-07-04 NOTE — Progress Notes (Signed)
[facility]                                                                               Donna Simmons, is a 81 y.o. female, DOB - [birth year], TIR:[id number] Admit date - 06/30/2022    Outpatient Primary MD for the patient is Donna Manges, DO  LOS - 4  days    Brief summary   Donna Simmons is a 81 year old female with PMH Alzheimer's dementia with behavioral disturbance, permanent A-fib, HTN, HLD who presented with worsening weakness and falls over the past approximately 2 months. UA was positive for pyuria and empiric Rocephin was initiated for presented UTI.  Due to failure to thrive and recurrent falls, EDP requested admission.  The patient was admitted by [facility], hospitalist service.    Assessment & Plan    Assessment and Plan:  Generalized weakness, multifactorial secondary to advanced dementia, poor oral intake, dehydration, presumptive UTI.  E coli UTI.  Completed 4 days of antibiotics.  She appears to be back to baseline .     Failure to thrive in an adult:  PT therapy eval recommending SNF.    Severe protein calorie malnutrition;  Dietary consulted.     Permanent Atrial Fibrillation;  Rate controlled with toprol XL  Resume eliquis for CVA PREVENTION.     Hyperlipidemia: resume Crestor.    GERD:  Resume PPI.     RN Pressure Injury Documentation: Pressure Injury 07/01/22 Coccyx Right;Left Stage 2 -  Partial thickness loss of dermis presenting as a shallow open injury with a red, pink wound bed without slough. (Active)  07/01/22 1405  Location: Coccyx  Location Orientation: Right;Left  Staging: Stage 2 -  Partial thickness loss of dermis presenting as a shallow open injury with a red, pink wound bed without slough.  Wound Description (Comments):   Present on Admission: Yes  Dressing Type Foam - Lift dressing to assess site every shift 07/03/22 0900   Wound care consulted and recommendations given.   Interventions: Ensure Enlive  (each supplement provides 350kcal and 20 grams of protein), Liberalize Diet  Estimated body mass index is 20.24 kg/m as calculated from the following:   Height as of this encounter: 5\' 2"  (1.575 m).   Weight as of this encounter: 50.2 kg.  Code Status: full code.  DVT Prophylaxis:   apixaban (ELIQUIS) tablet 5 mg   Level of Care: Level of care: Med-Surg Family Communication: none at bedside. Discussed with husband on the phone.   Disposition Plan:     Remains inpatient appropriate:    Procedures:  None.   Consultants:   Palliative care.   Antimicrobials:   Anti-infectives (From admission, onward)    Start     Dose/Rate Route Frequency Ordered Stop   07/01/22 1000  cefTRIAXone (ROCEPHIN) 2 g in sodium chloride 0.9 % 100 mL IVPB        2 g 200 mL/hr over 30 Minutes Intravenous Every 24 hours 06/30/22 2329 07/04/22 1119   06/30/22 2245  cefTRIAXone (ROCEPHIN) 1 g in sodium chloride 0.9 % 100 mL IVPB        1 g 200 mL/hr over 30 Minutes Intravenous  Once 06/30/22 2236 07/01/22 0015        Medications  Scheduled Meds:  apixaban  5 mg Oral BID   feeding supplement  237 mL Oral BID BM   metoprolol succinate  12.5 mg Oral Daily   pantoprazole  40 mg Oral Daily   Continuous Infusions:   PRN Meds:.acetaminophen, melatonin, polyethylene glycol, prochlorperazine    Subjective:   Donna Simmons was seen and examined today.  NO new complaints.  Objective:   Vitals:   07/03/22 1251 07/03/22 2224 07/04/22 0121 07/04/22 0529  BP: 126/66 116/81 (!) 120/57 (!) 141/79  Pulse: 65 71 (!) 58 83  Resp: 18 16    Temp: (!) 97.3 F (36.3 C) (!) 97.5 F (36.4 C) 97.8 F (36.6 C) (!) 97.5 F (36.4 C)  TempSrc: Oral Oral    SpO2: 100% 100% 98% 93%  Weight:      Height:        Intake/Output Summary (Last 24 hours) at 07/04/2022 1308 Last data filed at 07/04/2022 1000 Gross per 24 hour  Intake 360 ml  Output 1650 ml  Net -1290 ml    Filed Weights   07/03/22 0900   Weight: 50.2 kg     Exam General exam: Appears calm and comfortable  Respiratory system: Clear to auscultation. Respiratory effort normal. Cardiovascular system: S1 & S2 heard, RRR. No JVD, . No pedal edema. Gastrointestinal system: Abdomen is nondistended, soft and nontender. Normal bowel sounds heard. Central nervous system: Alert and oriented to place and person, grossly  non focal. Extremities: Symmetric 5 x 5 power. Skin: No rashes, lesions or ulcers Psychiatry: Mood & affect appropriate.     Data Reviewed:  I have personally reviewed following labs and imaging studies   CBC Lab Results  Component Value Date   WBC 4.4 07/04/2022   RBC 4.67 07/04/2022   HGB 12.1 07/04/2022   HCT 38.0 07/04/2022   MCV 81.4 07/04/2022   MCH 25.9 (L) 07/04/2022   PLT 234 07/04/2022   MCHC 31.8 07/04/2022   RDW 15.8 (H) 07/04/2022   LYMPHSABS 1.0 07/04/2022   MONOABS 0.3 07/04/2022   EOSABS 0.0 07/04/2022   BASOSABS 0.0 62/37/6283     Last metabolic panel Lab Results  Component Value Date   NA 138 07/04/2022   K 3.7 07/04/2022   CL 101 07/04/2022   CO2 27 07/04/2022   BUN 10 07/04/2022   CREATININE 0.73 07/04/2022   GLUCOSE 87 07/04/2022   GFRNONAA >60 07/04/2022   GFRAA 62 04/04/2020   CALCIUM 8.5 (L) 07/04/2022   PHOS 3.2 07/01/2022   PROT 5.6 (L) 07/01/2022   ALBUMIN 2.8 (L) 07/01/2022   LABGLOB 2.6 04/04/2020   AGRATIO 1.7 04/04/2020   BILITOT 1.0 07/01/2022   ALKPHOS 40 07/01/2022   AST 65 (H) 07/01/2022   ALT 23 07/01/2022   ANIONGAP 10 07/04/2022    CBG (last 3)  No results for input(s): "GLUCAP" in the last 72 hours.    Coagulation Profile: No results for input(s): "INR", "PROTIME" in the last 168 hours.   Radiology Studies: No results found.     Donna Simmons M.D. [facility] 07/04/2022, 1:08 PM  Available via Epic secure chat 7am-7pm After 7 pm, please refer to night coverage provider listed on amion.

## 2022-07-05 DIAGNOSIS — R627 Adult failure to thrive: Secondary | ICD-10-CM | POA: Diagnosis not present

## 2022-07-05 DIAGNOSIS — R531 Weakness: Secondary | ICD-10-CM | POA: Diagnosis not present

## 2022-07-05 DIAGNOSIS — N3000 Acute cystitis without hematuria: Secondary | ICD-10-CM | POA: Diagnosis not present

## 2022-07-05 LAB — BASIC METABOLIC PANEL
Anion gap: 9 (ref 5–15)
BUN: 15 mg/dL (ref 8–23)
CO2: 26 mmol/L (ref 22–32)
Calcium: 8.5 mg/dL — ABNORMAL LOW (ref 8.9–10.3)
Chloride: 100 mmol/L (ref 98–111)
Creatinine, Ser: 0.76 mg/dL (ref 0.44–1.00)
GFR, Estimated: >60 mL/min (ref >60)
Glucose, Bld: 91 mg/dL (ref 70–99)
Potassium: 3.6 mmol/L (ref 3.5–5.1)
Sodium: 135 mmol/L (ref 135–145)

## 2022-07-05 LAB — CBC WITH DIFFERENTIAL/PLATELET
Abs Immature Granulocytes: 0.01 10*3/uL (ref 0.00–0.07)
Basophils Absolute: 0 10*3/uL (ref 0.0–0.1)
Basophils Relative: 0 %
Eosinophils Absolute: 0 10*3/uL (ref 0.0–0.5)
Eosinophils Relative: 0 %
HCT: 35.7 % — ABNORMAL LOW (ref 36.0–46.0)
Hemoglobin: 11.4 g/dL — ABNORMAL LOW (ref 12.0–15.0)
Immature Granulocytes: 0 %
Lymphocytes Relative: 20 %
Lymphs Abs: 1.1 10*3/uL (ref 0.7–4.0)
MCH: 25.7 pg — ABNORMAL LOW (ref 26.0–34.0)
MCHC: 31.9 g/dL (ref 30.0–36.0)
MCV: 80.4 fL (ref 80.0–100.0)
Monocytes Absolute: 0.5 10*3/uL (ref 0.1–1.0)
Monocytes Relative: 9 %
Neutro Abs: 3.8 10*3/uL (ref 1.7–7.7)
Neutrophils Relative %: 71 %
Platelets: 232 10*3/uL (ref 150–400)
RBC: 4.44 MIL/uL (ref 3.87–5.11)
RDW: 15.7 % — ABNORMAL HIGH (ref 11.5–15.5)
WBC: 5.3 10*3/uL (ref 4.0–10.5)
nRBC: 0 % (ref 0.0–0.2)

## 2022-07-05 LAB — MAGNESIUM: Magnesium: 2 mg/dL (ref 1.7–2.4)

## 2022-07-05 NOTE — Progress Notes (Signed)
[facility]                                                                               Donna Simmons, is a 81 y.o. female, DOB - 1941/05/21, KWI:[id number] Admit date - 06/30/2022    Outpatient Primary MD for the patient is [name], DO  LOS - 5  days    Brief summary   Donna Simmons is a 81 year old female with PMH Alzheimer's dementia with behavioral disturbance, permanent A-fib, HTN, HLD who presented with worsening weakness and falls over the past approximately 2 months. UA was positive for pyuria and empiric Rocephin was initiated for presented UTI.  Due to failure to thrive and recurrent falls, EDP requested admission.  The patient was admitted by [facility], hospitalist service.    Assessment & Plan    Assessment and Plan:  Generalized weakness, multifactorial secondary to advanced dementia, poor oral intake, dehydration, presumptive UTI.  E coli UTI.  Completed 4 days of antibiotics.  She appears to be back to baseline .  Discussed with niece at bedside. Will probably need long term placement / memory care unit after SNF.     Failure to thrive in an adult:  PT therapy eval recommending SNF.    Severe protein calorie malnutrition;  Dietary consulted.     Permanent Atrial Fibrillation;  Rate controlled with toprol XL  Resume eliquis for CVA PREVENTION.     Hyperlipidemia: resume Crestor.    GERD:  Resume PPI.     RN Pressure Injury Documentation: Pressure Injury 07/01/22 Coccyx Right;Left Stage 2 -  Partial thickness loss of dermis presenting as a shallow open injury with a red, pink wound bed without slough. (Active)  07/01/22 1405  Location: Coccyx  Location Orientation: Right;Left  Staging: Stage 2 -  Partial thickness loss of dermis presenting as a shallow open injury with a red, pink wound bed without slough.  Wound Description (Comments):   Present on Admission: Yes  Dressing Type Foam - Lift dressing to assess site every  shift 07/05/22 0845   Wound care consulted and recommendations given.   Interventions: Ensure Enlive (each supplement provides 350kcal and 20 grams of protein), Liberalize Diet  Estimated body mass index is 20.24 kg/m as calculated from the following:   Height as of this encounter: 5\' 2"  (1.575 m).   Weight as of this encounter: 50.2 kg.  Code Status: full code.  DVT Prophylaxis:   apixaban (ELIQUIS) tablet 5 mg   Level of Care: Level of care: Med-Surg Family Communication: none at bedside. Discussed with husband on the phone. FAMILY atb edside.   Disposition Plan:     Remains inpatient appropriate:  SNF placement.   Procedures:  None.   Consultants:   Palliative care.   Antimicrobials:   Anti-infectives (From admission, onward)    Start     Dose/Rate Route Frequency Ordered Stop   07/01/22 1000  cefTRIAXone (ROCEPHIN) 2 g in sodium chloride 0.9 % 100 mL IVPB        2 g 200 mL/hr over 30 Minutes Intravenous Every 24 hours 06/30/22 2329 07/04/22 1119   06/30/22 2245  cefTRIAXone (ROCEPHIN) 1 g in  sodium chloride 0.9 % 100 mL IVPB        1 g 200 mL/hr over 30 Minutes Intravenous  Once 06/30/22 2236 07/01/22 0015        Medications  Scheduled Meds:  apixaban  5 mg Oral BID   feeding supplement  237 mL Oral BID BM   metoprolol succinate  12.5 mg Oral Daily   pantoprazole  40 mg Oral Daily   Continuous Infusions:   PRN Meds:.acetaminophen, melatonin, polyethylene glycol, prochlorperazine    Subjective:   Donna Simmons was seen and examined today.  Comfortable.  Objective:   Vitals:   07/04/22 0529 07/04/22 1343 07/04/22 2050 07/05/22 1339  BP: (!) 141/79 138/74 104/77 (!) 123/59  Pulse: 83 84 62 (!) 57  Resp:  18  12  Temp: (!) 97.5 F (36.4 C) 98 F (36.7 C) 98.5 F (36.9 C) 97.6 F (36.4 C)  TempSrc:  Axillary Axillary Oral  SpO2: 93% 100% 94% 98%  Weight:      Height:        Intake/Output Summary (Last 24 hours) at 07/05/2022 1406 Last data  filed at 07/05/2022 0645 Gross per 24 hour  Intake 480 ml  Output 800 ml  Net -320 ml    Filed Weights   07/03/22 0900  Weight: 50.2 kg     Exam General exam: Appears calm and comfortable  Respiratory system: Clear to auscultation. Respiratory effort normal. Cardiovascular system: S1 & S2 heard, RRR. No JVD, No pedal edema. Gastrointestinal system: Abdomen is nondistended, soft and nontender.  Normal bowel sounds heard. Central nervous system: Alert and oriented to person only.  Extremities: Symmetric 5 x 5 power. Skin: No rashes, lesions or ulcers Psychiatry: Mood & affect appropriate.       Data Reviewed:  I have personally reviewed following labs and imaging studies   CBC Lab Results  Component Value Date   WBC 5.3 07/05/2022   RBC 4.44 07/05/2022   HGB 11.4 (L) 07/05/2022   HCT 35.7 (L) 07/05/2022   MCV 80.4 07/05/2022   MCH 25.7 (L) 07/05/2022   PLT 232 07/05/2022   MCHC 31.9 07/05/2022   RDW 15.7 (H) 07/05/2022   LYMPHSABS 1.1 07/05/2022   MONOABS 0.5 07/05/2022   EOSABS 0.0 07/05/2022   BASOSABS 0.0 07/05/2022     Last metabolic panel Lab Results  Component Value Date   NA 135 07/05/2022   K 3.6 07/05/2022   CL 100 07/05/2022   CO2 26 07/05/2022   BUN 15 07/05/2022   CREATININE 0.76 07/05/2022   GLUCOSE 91 07/05/2022   GFRNONAA >60 07/05/2022   GFRAA 62 04/04/2020   CALCIUM 8.5 (L) 07/05/2022   PHOS 3.2 07/01/2022   PROT 5.6 (L) 07/01/2022   ALBUMIN 2.8 (L) 07/01/2022   LABGLOB 2.6 04/04/2020   AGRATIO 1.7 04/04/2020   BILITOT 1.0 07/01/2022   ALKPHOS 40 07/01/2022   AST 65 (H) 07/01/2022   ALT 23 07/01/2022   ANIONGAP 9 07/05/2022    CBG (last 3)  No results for input(s): "GLUCAP" in the last 72 hours.    Coagulation Profile: No results for input(s): "INR", "PROTIME" in the last 168 hours.   Radiology Studies: No results found.     Kathlen Mody M.D. [facility] 07/05/2022, 2:06 PM  Available via Epic secure chat  7am-7pm After 7 pm, please refer to night coverage provider listed on amion.

## 2022-07-06 DIAGNOSIS — R531 Weakness: Secondary | ICD-10-CM | POA: Diagnosis not present

## 2022-07-06 DIAGNOSIS — R627 Adult failure to thrive: Secondary | ICD-10-CM | POA: Diagnosis not present

## 2022-07-06 DIAGNOSIS — N3000 Acute cystitis without hematuria: Secondary | ICD-10-CM | POA: Diagnosis not present

## 2022-07-06 LAB — CBC WITH DIFFERENTIAL/PLATELET
Abs Immature Granulocytes: 0.02 10*3/uL (ref 0.00–0.07)
Basophils Absolute: 0 10*3/uL (ref 0.0–0.1)
Basophils Relative: 1 %
Eosinophils Absolute: 0 10*3/uL (ref 0.0–0.5)
Eosinophils Relative: 1 %
HCT: 35.2 % — ABNORMAL LOW (ref 36.0–46.0)
Hemoglobin: 11.4 g/dL — ABNORMAL LOW (ref 12.0–15.0)
Immature Granulocytes: 0 %
Lymphocytes Relative: 27 %
Lymphs Abs: 1.3 10*3/uL (ref 0.7–4.0)
MCH: 25.9 pg — ABNORMAL LOW (ref 26.0–34.0)
MCHC: 32.4 g/dL (ref 30.0–36.0)
MCV: 79.8 fL — ABNORMAL LOW (ref 80.0–100.0)
Monocytes Absolute: 0.4 10*3/uL (ref 0.1–1.0)
Monocytes Relative: 8 %
Neutro Abs: 3 10*3/uL (ref 1.7–7.7)
Neutrophils Relative %: 63 %
Platelets: 232 10*3/uL (ref 150–400)
RBC: 4.41 MIL/uL (ref 3.87–5.11)
RDW: 15.5 % (ref 11.5–15.5)
WBC: 4.7 10*3/uL (ref 4.0–10.5)
nRBC: 0 % (ref 0.0–0.2)

## 2022-07-06 LAB — BASIC METABOLIC PANEL
Anion gap: 6 (ref 5–15)
BUN: 13 mg/dL (ref 8–23)
CO2: 29 mmol/L (ref 22–32)
Calcium: 8.9 mg/dL (ref 8.9–10.3)
Chloride: 105 mmol/L (ref 98–111)
Creatinine, Ser: 0.7 mg/dL (ref 0.44–1.00)
GFR, Estimated: >60 mL/min (ref >60)
Glucose, Bld: 95 mg/dL (ref 70–99)
Potassium: 4.3 mmol/L (ref 3.5–5.1)
Sodium: 140 mmol/L (ref 135–145)

## 2022-07-06 LAB — MAGNESIUM: Magnesium: 2.3 mg/dL (ref 1.7–2.4)

## 2022-07-06 MED ORDER — TRAZODONE HCL 50 MG PO TABS
50.0000 mg | ORAL_TABLET | Freq: Every day | ORAL | Status: DC
  Administered 2022-07-06 – 2022-07-07 (×2): 50 mg via ORAL
  Filled 2022-07-06 (×2): qty 1

## 2022-07-06 NOTE — Progress Notes (Signed)
[facility]                                                                               Donna Simmons, is a 81 y.o. female, DOB - 09-07-1940, [id number] Admit date - 06/30/2022    Outpatient Primary MD for the patient is [name], DO  LOS - 6  days    Brief summary   Ms. Counts is a 81 year old female with PMH Alzheimer's dementia with behavioral disturbance, permanent A-fib, HTN, HLD who presented with worsening weakness and falls over the past approximately 2 months. UA was positive for pyuria and empiric Rocephin was initiated for presented UTI.  Due to failure to thrive and recurrent falls, EDP requested admission.  The patient was admitted by [facility], hospitalist service. Completed treatment for UTI, awaiting SNF placement.     Assessment & Plan    Assessment and Plan:  Generalized weakness, multifactorial secondary to advanced dementia, poor oral intake, dehydration, presumptive UTI.  E coli UTI.  Completed 4 days of antibiotics.  She appears to be back to baseline .  Discussed with niece at bedside. Will probably need long term placement / memory care unit after SNF.     Failure to thrive in an adult:  PT therapy eval recommending SNF.    Severe protein calorie malnutrition;  Dietary consulted.     Permanent Atrial Fibrillation;  Rate controlled with toprol XL  Resume eliquis for CVA PREVENTION.     Hyperlipidemia: resume Crestor.    GERD:  Resume PPI.     RN Pressure Injury Documentation: Pressure Injury 07/01/22 Coccyx Right;Left Stage 2 -  Partial thickness loss of dermis presenting as a shallow open injury with a red, pink wound bed without slough. (Active)  07/01/22 1405  Location: Coccyx  Location Orientation: Right;Left  Staging: Stage 2 -  Partial thickness loss of dermis presenting as a shallow open injury with a red, pink wound bed without slough.  Wound Description (Comments):   Present on Admission: Yes   Dressing Type Foam - Lift dressing to assess site every shift 07/06/22 0831   Wound care consulted and recommendations given.   Interventions: Ensure Enlive (each supplement provides 350kcal and 20 grams of protein), Liberalize Diet  Estimated body mass index is 20.24 kg/m as calculated from the following:   Height as of this encounter: 5\' 2"  (1.575 m).   Weight as of this encounter: 50.2 kg.  Code Status: full code.  DVT Prophylaxis:   apixaban (ELIQUIS) tablet 5 mg   Level of Care: Level of care: Med-Surg Family Communication: none at bedside. Discussed with husband on the phone. FAMILY atb edside.   Disposition Plan:     Remains inpatient appropriate:  SNF placement.   Procedures:  None.   Consultants:   Palliative care.   Antimicrobials:   Anti-infectives (From admission, onward)    Start     Dose/Rate Route Frequency Ordered Stop   07/01/22 1000  cefTRIAXone (ROCEPHIN) 2 g in sodium chloride 0.9 % 100 mL IVPB        2 g 200 mL/hr over 30 Minutes Intravenous Every 24 hours 06/30/22 2329 07/04/22 1119  06/30/22 2245  cefTRIAXone (ROCEPHIN) 1 g in sodium chloride 0.9 % 100 mL IVPB        1 g 200 mL/hr over 30 Minutes Intravenous  Once 06/30/22 2236 07/01/22 0015        Medications  Scheduled Meds:  apixaban  5 mg Oral BID   feeding supplement  237 mL Oral BID BM   metoprolol succinate  12.5 mg Oral Daily   pantoprazole  40 mg Oral Daily   traZODone  50 mg Oral QHS   Continuous Infusions:   PRN Meds:.acetaminophen, melatonin, polyethylene glycol, prochlorperazine    Subjective:   [name] was seen and examined today.  No new complaints. Requesting for sleep aid at night.  Objective:   Vitals:   07/05/22 1339 07/05/22 2132 07/06/22 0550 07/06/22 1342  BP: (!) 123/59 (!) 106/94 115/76 102/63  Pulse: (!) 57 61 93 82  Resp: 12 16 14 16   Temp: 97.6 F (36.4 C) 98.2 F (36.8 C) (!) 97.5 F (36.4 C) 98 F (36.7 C)  TempSrc: Oral Oral Oral  Oral  SpO2: 98% (!) 86% (!) 78% 100%  Weight:      Height:        Intake/Output Summary (Last 24 hours) at 07/06/2022 1824 Last data filed at 07/06/2022 1800 Gross per 24 hour  Intake 420 ml  Output 750 ml  Net -330 ml    Filed Weights   07/03/22 0900  Weight: 50.2 kg     Exam General exam: Appears calm and comfortable  Respiratory system: Clear to auscultation. Respiratory effort normal. Cardiovascular system: S1 & S2 heard, RRR. No JVD,  No pedal edema. Gastrointestinal system: Abdomen is nondistended, soft and nontender.  Normal bowel sounds heard. Central nervous system: sleeping comfortably.  Extremities: Symmetric 5 x 5 power. Skin: No rashes, lesions or ulcers Psychiatry:  Mood & affect appropriate.      Data Reviewed:  I have personally reviewed following labs and imaging studies   CBC Lab Results  Component Value Date   WBC 4.7 07/06/2022   RBC 4.41 07/06/2022   HGB 11.4 (L) 07/06/2022   HCT 35.2 (L) 07/06/2022   MCV 79.8 (L) 07/06/2022   MCH 25.9 (L) 07/06/2022   PLT 232 07/06/2022   MCHC 32.4 07/06/2022   RDW 15.5 07/06/2022   LYMPHSABS 1.3 07/06/2022   MONOABS 0.4 07/06/2022   EOSABS 0.0 07/06/2022   BASOSABS 0.0 07/06/2022     Last metabolic panel Lab Results  Component Value Date   NA 140 07/06/2022   K 4.3 07/06/2022   CL 105 07/06/2022   CO2 29 07/06/2022   BUN 13 07/06/2022   CREATININE 0.70 07/06/2022   GLUCOSE 95 07/06/2022   GFRNONAA >60 07/06/2022   GFRAA 62 04/04/2020   CALCIUM 8.9 07/06/2022   PHOS 3.2 07/01/2022   PROT 5.6 (L) 07/01/2022   ALBUMIN 2.8 (L) 07/01/2022   LABGLOB 2.6 04/04/2020   AGRATIO 1.7 04/04/2020   BILITOT 1.0 07/01/2022   ALKPHOS 40 07/01/2022   AST 65 (H) 07/01/2022   ALT 23 07/01/2022   ANIONGAP 6 07/06/2022    CBG (last 3)  No results for input(s): "GLUCAP" in the last 72 hours.    Coagulation Profile: No results for input(s): "INR", "PROTIME" in the last 168 hours.   Radiology  Studies: No results found.     13/12/2021 M.D. [facility] 07/06/2022, 6:24 PM  Available via Epic secure chat 7am-7pm After 7 pm, please refer to night  coverage provider listed on amion.

## 2022-07-07 DIAGNOSIS — R627 Adult failure to thrive: Secondary | ICD-10-CM | POA: Diagnosis not present

## 2022-07-07 DIAGNOSIS — N3000 Acute cystitis without hematuria: Secondary | ICD-10-CM | POA: Diagnosis not present

## 2022-07-07 DIAGNOSIS — R531 Weakness: Secondary | ICD-10-CM | POA: Diagnosis not present

## 2022-07-07 DIAGNOSIS — E43 Unspecified severe protein-calorie malnutrition: Secondary | ICD-10-CM

## 2022-07-07 MED ORDER — MELATONIN 5 MG PO TABS: 5.0000 mg | ORAL_TABLET | Freq: Every evening | ORAL | 0 refills | Status: AC | PRN

## 2022-07-07 MED ORDER — ACETAMINOPHEN 325 MG PO TABS: 650.0000 mg | ORAL_TABLET | Freq: Four times a day (QID) | ORAL | Status: AC | PRN

## 2022-07-07 MED ORDER — POLYETHYLENE GLYCOL 3350 17 G PO PACK: 17.0000 g | PACK | Freq: Every day | ORAL | 0 refills | Status: AC | PRN

## 2022-07-07 MED ORDER — METOPROLOL SUCCINATE ER 25 MG PO TB24: 12.5000 mg | ORAL_TABLET | Freq: Every day | ORAL | Status: DC

## 2022-07-07 MED ORDER — TRAZODONE HCL 50 MG PO TABS: 50.0000 mg | ORAL_TABLET | Freq: Every day | ORAL | Status: DC

## 2022-07-07 NOTE — Progress Notes (Signed)
Physical Therapy Treatment Patient Details Name: Donna Simmons MRN: [id number] DOB: [birth year] Today's Date: 07/07/2022   History of Present Illness Donna Simmons is a 81 y.o. female with medical history significant for late onset Alzheimer's dementia with behavioral disturbance, followed by gerontology, permanent atrial fibrillation on Eliquis, essential hypertension, hyperlipidemia, who presented from home due to failure to thrive and frequent falls for the past 2 months.  Family endorses poor oral intake, generalized weakness, and progressive difficulty with ambulation.  [place] the day prior to presentation, landing on her knees.     In the ED, imaging were negative for fracture.  She was noted to be dehydrated with electrolytes abnormalities.  She received IV fluid hydration normal saline 500 cc/h x 1.  UA was positive for pyuria and empiric Rocephin was initiated for presented UTI.  Due to failure to thrive and recurrent falls, EDP requested admission.  The patient was admitted by [facility], hospitalist service.    PT Comments    The  patient is awake, family member is at bedside. Patient is  calm and able to be worked with, startles easily with noise and quick movements.  Assisted  patient to sitting, made several attempts to stand from bed with  assistance of 2,  patient tends to pull back.  Did not get buttocks to clear bed. Patient sat with supervision  x 15".  Continue PT for mobility.    Recommendations for follow up therapy are one component of a multi-disciplinary discharge planning process, led by the attending physician.  Recommendations may be updated based on patient status, additional functional criteria and insurance authorization.  Follow Up Recommendations  Skilled nursing-short term rehab (<3 hours/day) Can patient physically be transported by private vehicle: No   Assistance Recommended at Discharge Frequent or constant Supervision/Assistance  Patient can return home with the  following Two people to help with walking and/or transfers;A lot of help with bathing/dressing/bathroom;Assistance with cooking/housework;Direct supervision/assist for medications management;Assist for transportation;Help with stairs or ramp for entrance;Direct supervision/assist for financial management   Equipment Recommendations       Recommendations for Other Services       Precautions / Restrictions Precautions Precautions: Fall Precaution Comments: incintinence     Mobility  Bed Mobility   Bed Mobility: Supine to Sit Rolling: Total assist     Sit to supine: Total assist, +2 for physical assistance   General bed mobility comments: pt unable to follow commands for techique 2* dementia    Transfers Overall transfer level: Needs assistance   Transfers: Sit to/from Stand Sit to Stand: Max assist, +2 safety/equipment           General transfer comment: attempted several  strategies: pull up on therapist, bear hug, pul on recliner, + 2 HHA to  rise from bed. patient  becomes more rigid and unable to power up.    Ambulation/Gait                   Stairs             Wheelchair Mobility    Modified Rankin (Stroke Patients Only)       Balance   Sitting-balance support: No upper extremity supported, Feet supported Sitting balance-Leahy Scale: Fair     Standing balance support: During functional activity, Bilateral upper extremity supported Standing balance-Leahy Scale: Poor Standing balance comment: flexed/kyphotic  Cognition Arousal/Alertness: Awake/alert Behavior During Therapy: WFL for tasks assessed/performed Overall Cognitive Status: History of cognitive impairments - at baseline Area of Impairment: Orientation, Memory, Following commands, Problem solving                   Current Attention Level: Focused     Safety/Judgement: Decreased awareness of safety Awareness: Intellectual    General Comments: startles  very easy to sounds and movement, does not really follow directions, did take Ensure box and drank from it w/ straw        Exercises      General Comments        Pertinent Vitals/Pain Pain Assessment Pain Assessment: PAINAD Breathing: normal Negative Vocalization: occasional moan/groan, low speech, negative/disapproving quality Facial Expression: smiling or inexpressive Body Language: tense, distressed pacing, fidgeting Consolability: no need to console PAINAD Score: 2 Pain Location: not specific, ?  left knee, has a dressing Pain Descriptors / Indicators: Grimacing, Guarding Pain Intervention(s): Monitored during session    Home Living                          Prior Function            PT Goals (current goals can now be found in the care plan section) Progress towards PT goals: Progressing toward goals    Frequency    Min 2X/week      PT Plan Current plan remains appropriate    Co-evaluation              AM-PAC PT "6 Clicks" Mobility   Outcome Measure  Help needed turning from your back to your side while in a flat bed without using bedrails?: Total Help needed moving from lying on your back to sitting on the side of a flat bed without using bedrails?: Total Help needed moving to and from a bed to a chair (including a wheelchair)?: Total Help needed standing up from a chair using your arms (e.g., wheelchair or bedside chair)?: Total Help needed to walk in hospital room?: Total Help needed climbing 3-5 steps with a railing? : Total 6 Click Score: 6    End of Session Equipment Utilized During Treatment: Gait belt Activity Tolerance: Patient tolerated treatment well Patient left: in bed;with bed alarm set;with family/visitor present;with call bell/phone within reach Nurse Communication: Mobility status PT Visit Diagnosis: Unsteadiness on feet (R26.81);Difficulty in walking, not elsewhere classified  (R26.2);History of falling (Z91.81);Repeated falls (R29.6);Adult, failure to thrive (R62.7)     Time: 8250-5397 PT Time Calculation (min) (ACUTE ONLY): 27 min  Charges:  $Therapeutic Activity: 23-37 mins                     [place] Office [phone] Weekend [phone]    Claretha Cooper 07/07/2022, 3:30 PM

## 2022-07-07 NOTE — Discharge Summary (Signed)
Physician Discharge Summary   Patient: Donna Simmons MRN: [id number] DOB: [birth year]  Admit date:     06/30/2022  Discharge date: 07/08/22  Discharge Physician: Hosie Poisson   PCP: [name], DO   Recommendations at discharge:  Please follow up with PCp in one week Please follow up with Geriatric physician/ Neurology for evaluation of dementia.  She would benefit from outpatient palliative care services.  Please check cbc and bmp in one week.   Discharge Diagnoses: Principal Problem:   Generalized weakness Active Problems:   Pressure injury of skin   Protein-calorie malnutrition, severe    Hospital Course: Donna Simmons is a 81 year old female with PMH Alzheimer's dementia with behavioral disturbance, permanent A-fib, HTN, HLD who presented with worsening weakness and falls over the past approximately 2 months. UA was positive for pyuria and empiric Rocephin was initiated for presented UTI.  Due to failure to thrive and recurrent falls, EDP requested admission.  The patient was admitted by [facility], hospitalist service. Completed treatment for UTI, awaiting SNF placement  Assessment and Plan: Generalized weakness, multifactorial secondary to advanced dementia, poor oral intake, dehydration, presumptive UTI.  E coli UTI.  Completed 4 days of antibiotics.  She appears to be back to baseline .  Discussed with niece at bedside. Will probably need long term placement / memory care unit after SNF.        Failure to thrive in an adult:  PT therapy eval recommending SNF.      Severe protein calorie malnutrition;  Dietary consulted.        Permanent Atrial Fibrillation;  Rate controlled with toprol XL  Resume eliquis for CVA PREVENTION.        Hyperlipidemia: resume Crestor.      GERD:  Resume PPI.        RN Pressure Injury Documentation:     Pressure Injury 07/01/22 Coccyx Right;Left Stage 2 -  Partial thickness loss of dermis presenting as a shallow open  injury with a red, pink wound bed without slough. (Active)  07/01/22 1405  Location: Coccyx  Location Orientation: Right;Left  Staging: Stage 2 -  Partial thickness loss of dermis presenting as a shallow open injury with a red, pink wound bed without slough.  Wound Description (Comments):   Present on Admission: Yes  Dressing Type Foam - Lift dressing to assess site every shift 07/06/22 0831    Wound care consulted and recommendations given.    Interventions: Ensure Enlive (each supplement provides 350kcal and 20 grams of protein), Liberalize Diet   Estimated body mass index is 20.24 kg/m as calculated from the following:   Height as of this encounter: 5\' 2"  (1.575 m).   Weight as of this encounter: 50.2 kg.    Consultants: none.  Procedures performed: none.   Disposition: Home Diet recommendation:  Regular diet DISCHARGE MEDICATION: Allergies as of 07/07/2022       Reactions   Antihistamines, Chlorpheniramine-type Palpitations   Tachycardia         Medication List     STOP taking these medications    rosuvastatin 20 MG tablet Commonly known as: CRESTOR       TAKE these medications    acetaminophen 325 MG tablet Commonly known as: TYLENOL Take 2 tablets (650 mg total) by mouth every 6 (six) hours as needed for mild pain, fever or headache.   Eliquis 5 MG Tabs tablet Generic drug: apixaban Take 1 tablet by mouth twice daily What changed: how much to take  Ensure Take 237 mLs by mouth 2 (two) times daily as needed (meal supplement). Vanilla   flecainide 50 MG tablet Commonly known as: TAMBOCOR TAKE 1 TABLET BY MOUTH TWICE DAILY. APPOINTMENT REQUIRED FOR FUTURE REFILLS What changed: See the new instructions.   melatonin 5 MG Tabs Take 1 tablet (5 mg total) by mouth at bedtime as needed.   metoprolol succinate 25 MG 24 hr tablet Commonly known as: TOPROL-XL Take 0.5 tablets (12.5 mg total) by mouth daily. Start taking on: July 08, 2022 What  changed:  medication strength See the new instructions.   omeprazole 40 MG capsule Commonly known as: PRILOSEC Take 40 mg by mouth daily.   OVER THE COUNTER MEDICATION Take 1 tablet by mouth daily. OTC Potassium supplement..   polyethylene glycol 17 g packet Commonly known as: MIRALAX / GLYCOLAX Take 17 g by mouth daily as needed for mild constipation.   traZODone 50 MG tablet Commonly known as: DESYREL Take 1 tablet (50 mg total) by mouth at bedtime.   VITAMIN D-3 PO Take 1 capsule by mouth daily.        Contact information for after-discharge care     Destination     [facility] Preferred SNF .   Service: Skilled Nursing Contact information: [address] [place] [address] [phone]                    Discharge Exam: Ceasar Mons Weights   07/03/22 0900  Weight: 50.2 kg   General exam: Appears calm and comfortable  Respiratory system: Clear to auscultation. Respiratory effort normal. Cardiovascular system: S1 & S2 heard, RRR. No JVD, murmurs, Gastrointestinal system: Abdomen is nondistended, soft and nontender.  Normal bowel sounds heard. Central nervous system: Alert and oriented to person only.  Extremities: Symmetric 5 x 5 power. Skin: No rashes, lesions or ulcers Psychiatry: Judgement and insight appear normal. Mood & affect appropriate.    Condition at discharge: fair  The results of significant diagnostics from this hospitalization (including imaging, microbiology, ancillary and laboratory) are listed below for reference.   Imaging Studies: DG Knee Complete 4 Views Left  Result Date: 06/30/2022 CLINICAL DATA:  Fall, weakness EXAM: LEFT KNEE - COMPLETE 4+ VIEW COMPARISON:  04/26/2018 FINDINGS: No evidence of fracture, dislocation, or joint effusion. Mild tricompartmental osteoarthritis, most pronounced within the medial compartment. Soft tissues are unremarkable. IMPRESSION: Negative. Electronically  Signed   By: Duanne Guess D.O.   On: 06/30/2022 19:50   DG Knee Complete 4 Views Right  Result Date: 06/30/2022 CLINICAL DATA:  Fall, weakness EXAM: RIGHT KNEE - COMPLETE 4+ VIEW COMPARISON:  None Available. FINDINGS: The knee is substantially flexed on imaging, reportedly the patient is contracted and straightening the knee was not feasible. This results in nonstandard orientation and some reduction in diagnostic sensitivity and specificity. No fracture or acute bony findings. No definite knee effusion. Mild marginal spurring of the patella. IMPRESSION: 1. No fracture or acute bony findings identified. 2. Mild marginal spurring of the patella. 3. Nonstandard positioning due to patient contracture. Electronically Signed   By: Gaylyn Rong M.D.   On: 06/30/2022 19:49   DG Pelvis 1-2 Views  Result Date: 06/30/2022 CLINICAL DATA:  Fall. EXAM: PELVIS - 1-2 VIEW COMPARISON:  Pelvis x-ray 08/06/2021 FINDINGS: There is no evidence of pelvic fracture or diastasis. No pelvic bone lesions are seen. Soft tissues are within normal limits. IMPRESSION: No fracture or dislocation. Electronically Signed   By: Darliss Cheney  M.D.   On: 06/30/2022 19:47   DG Chest 2 View  Result Date: 06/30/2022 CLINICAL DATA:  Fall EXAM: CHEST - 2 VIEW COMPARISON:  Chest x-ray 12/03/2021 FINDINGS: Patient's chin overlies the left lung apex which limits evaluation. Cardiomediastinal silhouette is within normal limits. There is some minimal patchy opacities in the left lung base. There is no pleural effusion. There is no large pneumothorax. No acute fractures are seen. IMPRESSION: Limited evaluation. Minimal patchy opacities in the left lung base could represent atelectasis or infection. Electronically Signed   By: Darliss CheneyAmy  Guttmann M.D.   On: 06/30/2022 19:45   CT HEAD WO CONTRAST (5MM)  Result Date: 06/30/2022 CLINICAL DATA:  Unable to walk/ambulate. EXAM: CT HEAD WITHOUT CONTRAST CT CERVICAL SPINE WITHOUT CONTRAST  TECHNIQUE: Multidetector CT imaging of the head and cervical spine was performed following the standard protocol without intravenous contrast. Multiplanar CT image reconstructions of the cervical spine were also generated. RADIATION DOSE REDUCTION: This exam was performed according to the departmental dose-optimization program which includes automated exposure control, adjustment of the mA and/or kV according to patient size and/or use of iterative reconstruction technique. COMPARISON:  04/24/2022 FINDINGS: CT HEAD FINDINGS Brain: Nonstandard positioning resulting in somewhat distorted images. This lowers diagnostic sensitivity and specificity. Stable ex vacuo prominence of the lateral ventricles. Periventricular white matter and corona radiata hypodensities favor chronic ischemic microvascular white matter disease. Otherwise, the brainstem, cerebellum, cerebral peduncles, thalamus, basal ganglia, basilar cisterns, and ventricular system appear within normal limits. No intracranial hemorrhage, mass lesion, or acute CVA. Vascular: Unremarkable Skull: Unremarkable Sinuses/Orbits: Chronic ethmoid and maxillary sinusitis. Other: No supplemental non-categorized findings. CT CERVICAL SPINE FINDINGS Alignment: The neck is substantially flexed. No significant abnormal subluxation. Skull base and vertebrae: Dental cavities noted as on image 59 series 9. No cervical spine fracture or acute bony findings identified. Soft tissues and spinal canal: Unremarkable Disc levels:  No osseous impingement identified. Upper chest: Biapical pleuroparenchymal scarring. Other: No supplemental non-categorized findings. IMPRESSION: 1. No acute intracranial findings or acute cervical spine findings. 2. Periventricular white matter and corona radiata hypodensities favor chronic ischemic microvascular white matter disease. 3. Substantial flexion of the cervical spine, query antecollis. 4. Chronic ethmoid and maxillary sinusitis. 5. Dental  cavities. Electronically Signed   By: Gaylyn RongWalter  Liebkemann M.D.   On: 06/30/2022 19:33   CT Cervical Spine Wo Contrast  Result Date: 06/30/2022 CLINICAL DATA:  Unable to walk/ambulate. EXAM: CT HEAD WITHOUT CONTRAST CT CERVICAL SPINE WITHOUT CONTRAST TECHNIQUE: Multidetector CT imaging of the head and cervical spine was performed following the standard protocol without intravenous contrast. Multiplanar CT image reconstructions of the cervical spine were also generated. RADIATION DOSE REDUCTION: This exam was performed according to the departmental dose-optimization program which includes automated exposure control, adjustment of the mA and/or kV according to patient size and/or use of iterative reconstruction technique. COMPARISON:  04/24/2022 FINDINGS: CT HEAD FINDINGS Brain: Nonstandard positioning resulting in somewhat distorted images. This lowers diagnostic sensitivity and specificity. Stable ex vacuo prominence of the lateral ventricles. Periventricular white matter and corona radiata hypodensities favor chronic ischemic microvascular white matter disease. Otherwise, the brainstem, cerebellum, cerebral peduncles, thalamus, basal ganglia, basilar cisterns, and ventricular system appear within normal limits. No intracranial hemorrhage, mass lesion, or acute CVA. Vascular: Unremarkable Skull: Unremarkable Sinuses/Orbits: Chronic ethmoid and maxillary sinusitis. Other: No supplemental non-categorized findings. CT CERVICAL SPINE FINDINGS Alignment: The neck is substantially flexed. No significant abnormal subluxation. Skull base and vertebrae: Dental cavities noted as on image 59 series 9. No  cervical spine fracture or acute bony findings identified. Soft tissues and spinal canal: Unremarkable Disc levels:  No osseous impingement identified. Upper chest: Biapical pleuroparenchymal scarring. Other: No supplemental non-categorized findings. IMPRESSION: 1. No acute intracranial findings or acute cervical spine  findings. 2. Periventricular white matter and corona radiata hypodensities favor chronic ischemic microvascular white matter disease. 3. Substantial flexion of the cervical spine, query antecollis. 4. Chronic ethmoid and maxillary sinusitis. 5. Dental cavities. Electronically Signed   By: Gaylyn Rong M.D.   On: 06/30/2022 19:33    Microbiology: Results for orders placed or performed during the hospital encounter of 06/30/22  Resp Panel by RT-PCR (Flu A&B, Covid) Anterior Nasal Swab     Status: None   Collection Time: 06/30/22  6:50 PM   Specimen: Anterior Nasal Swab  Result Value Ref Range Status   SARS Coronavirus 2 by RT PCR NEGATIVE NEGATIVE Final    Comment: (NOTE) SARS-CoV-2 target nucleic acids are NOT DETECTED.  The SARS-CoV-2 RNA is generally detectable in upper respiratory specimens during the acute phase of infection. The lowest concentration of SARS-CoV-2 viral copies this assay can detect is 138 copies/mL. A negative result does not preclude SARS-Cov-2 infection and should not be used as the sole basis for treatment or other patient management decisions. A negative result may occur with  improper specimen collection/handling, submission of specimen other than nasopharyngeal swab, presence of viral mutation(s) within the areas targeted by this assay, and inadequate number of viral copies(<138 copies/mL). A negative result must be combined with clinical observations, patient history, and epidemiological information. The expected result is Negative.  Fact Sheet for Patients:  BloggerCourse.com  Fact Sheet for Healthcare Providers:  SeriousBroker.it  This test is no t yet approved or cleared by the [place] FDA and  has been authorized for detection and/or diagnosis of SARS-CoV-2 by FDA under an Emergency Use Authorization (EUA). This EUA will remain  in effect (meaning this test can be used) for the duration of  the COVID-19 declaration under Section 564(b)(1) of the Act, 21 U.S.C.section 360bbb-3(b)(1), unless the authorization is terminated  or revoked sooner.       Influenza A by PCR NEGATIVE NEGATIVE Final   Influenza B by PCR NEGATIVE NEGATIVE Final    Comment: (NOTE) The Xpert Xpress SARS-CoV-2/FLU/RSV plus assay is intended as an aid in the diagnosis of influenza from Nasopharyngeal swab specimens and should not be used as a sole basis for treatment. Nasal washings and aspirates are unacceptable for Xpert Xpress SARS-CoV-2/FLU/RSV testing.  Fact Sheet for Patients: BloggerCourse.com  Fact Sheet for Healthcare Providers: SeriousBroker.it  This test is not yet approved or cleared by the [place] FDA and has been authorized for detection and/or diagnosis of SARS-CoV-2 by FDA under an Emergency Use Authorization (EUA). This EUA will remain in effect (meaning this test can be used) for the duration of the COVID-19 declaration under Section 564(b)(1) of the Act, 21 U.S.C. section 360bbb-3(b)(1), unless the authorization is terminated or revoked.  Performed at [facility], [address] [place], [place] [address]   Urine Culture     Status: Abnormal   Collection Time: 06/30/22  9:51 PM   Specimen: Urine, Clean Catch  Result Value Ref Range Status   Specimen Description   Final    URINE, CLEAN CATCH Performed at [facility], [address], [place], [place] [address]    Special Requests   Final    NONE Performed at [facility], [address], [place], [place] [address]    Culture >=100,000 COLONIES/mL ESCHERICHIA COLI (A)  Final   Report Status 07/03/2022 FINAL  Final   Organism ID, Bacteria ESCHERICHIA COLI (A)  Final      Susceptibility   Escherichia coli - MIC*    AMPICILLIN 8 SENSITIVE Sensitive     CEFAZOLIN <=4 SENSITIVE Sensitive     CEFEPIME <=0.12  SENSITIVE Sensitive     CEFTRIAXONE <=0.25 SENSITIVE Sensitive     CIPROFLOXACIN <=0.25 SENSITIVE Sensitive     GENTAMICIN <=1 SENSITIVE Sensitive     IMIPENEM <=0.25 SENSITIVE Sensitive     NITROFURANTOIN <=16 SENSITIVE Sensitive     TRIMETH/SULFA <=20 SENSITIVE Sensitive     AMPICILLIN/SULBACTAM 4 SENSITIVE Sensitive     PIP/TAZO <=4 SENSITIVE Sensitive     * >=100,000 COLONIES/mL ESCHERICHIA COLI    Labs: CBC: Recent Labs  Lab 07/02/22 0451 07/03/22 0458 07/04/22 0437 07/05/22 0542 07/06/22 0414  WBC 7.3 6.5 4.4 5.3 4.7  NEUTROABS 5.8 4.9 3.1 3.8 3.0  HGB 11.2* 11.7* 12.1 11.4* 11.4*  HCT 35.1* 37.2 38.0 35.7* 35.2*  MCV 81.1 81.2 81.4 80.4 79.8*  PLT 185 199 234 232 232   Basic Metabolic Panel: Recent Labs  Lab 07/01/22 0510 07/02/22 0451 07/03/22 0458 07/04/22 0437 07/05/22 0542 07/06/22 0414  NA 144 141 138 138 135 140  K 3.1* 4.0 3.6 3.7 3.6 4.3  CL 111 109 104 101 100 105  CO2 24 25 26 27 26 29   GLUCOSE 88 101* 90 87 91 95  BUN 12 9 10 10 15 13   CREATININE 0.70 0.70 0.61 0.73 0.76 0.70  CALCIUM 8.3* 8.6* 8.7* 8.5* 8.5* 8.9  MG 1.9 1.9 1.7 2.0 2.0 2.3  PHOS 3.2  --   --   --   --   --    Liver Function Tests: Recent Labs  Lab 06/30/22 1838 06/30/22 2024 07/01/22 0510  AST SPECIMEN GROSSLY HEMOLYZED, REORDER PLACED BY LAB 77* 65*  ALT SPECIMEN GROSSLY HEMOLYZED, REORDER PLACED BY LAB 25 23  ALKPHOS SPECIMEN GROSSLY HEMOLYZED, REORDER PLACED BY LAB 48 40  BILITOT SPECIMEN GROSSLY HEMOLYZED, REORDER PLACED BY LAB 1.0 1.0  PROT SPECIMEN GROSSLY HEMOLYZED, REORDER PLACED BY LAB 6.5 5.6*  ALBUMIN SPECIMEN GROSSLY HEMOLYZED, REORDER PLACED BY LAB 3.3* 2.8*   CBG: No results for input(s): "GLUCAP" in the last 168 hours.  Discharge time spent: 42 minutes.   Signed: 2025, MD [facility] 07/07/2022

## 2022-07-07 NOTE — TOC Progression Note (Signed)
Transition of Care [facility]) - Progression Note    Patient Details  Name: Donna Simmons MRN: [id number] Date of Birth: 18-Sep-1940  Transition of Care [facility]) CM/SW Contact  Lennart Pall, [place] Phone Number: 07/07/2022, 2:39 PM  Clinical Narrative:    Have received request from pt's insurance for current therapy note and have alerted therapy team who will see her today.  Hope to have insurance auth by tomorrow and pt/spouse have accepted bed at [facility].    Expected Discharge Plan: [place] Barriers to Discharge: Continued Medical Work up  Expected Discharge Plan and Services Expected Discharge Plan: [place]   Discharge Planning Services: CM Consult Post Acute Care Choice: [place] Living arrangements for the past 2 months: Single Family Home                                       Social Determinants of Health (SDOH) Interventions Housing Interventions: Inpatient TOC  Readmission Risk Interventions     No data to display

## 2022-07-07 NOTE — Progress Notes (Signed)
Occupational Therapy Treatment Patient Details Name: Donna Simmons MRN: [id number] DOB: August 30, 1941 Today's Date: 07/07/2022   History of present illness Donna Simmons is a 81 y.o. female with medical history significant for late onset Alzheimer's dementia with behavioral disturbance, followed by gerontology, permanent atrial fibrillation on Eliquis, essential hypertension, hyperlipidemia, who presented from home due to failure to thrive and frequent falls for the past 2 months.  Family endorses poor oral intake, generalized weakness, and progressive difficulty with ambulation.  [place] the day prior to presentation, landing on her knees.     In the ED, imaging were negative for fracture.  She was noted to be dehydrated with electrolytes abnormalities.  She received IV fluid hydration normal saline 500 cc/h x 1.  UA was positive for pyuria and empiric Rocephin was initiated for presented UTI.  Due to failure to thrive and recurrent falls, EDP requested admission.  The patient was admitted by [facility], hospitalist service.   OT comments  Patient was able to attempt to engage in self feeding tasks with education to family members in room on promoting least distracting environment for patient to be able to engage in self feeding tasks. Patients niece and husband verbalized understanding. Patient would continue to benefit from skilled OT services at this time while admitted and after d/c to address noted deficits in order to improve overall safety and independence in ADLs.     Recommendations for follow up therapy are one component of a multi-disciplinary discharge planning process, led by the attending physician.  Recommendations may be updated based on patient status, additional functional criteria and insurance authorization.    Follow Up Recommendations  Skilled nursing-short term rehab (<3 hours/day)    Assistance Recommended at Discharge Frequent or constant Supervision/Assistance  Patient can return  home with the following  Assistance with cooking/housework;Direct supervision/assist for financial management;Assistance with feeding;Assist for transportation;Direct supervision/assist for medications management;A lot of help with walking and/or transfers;A lot of help with bathing/dressing/bathroom   Equipment Recommendations  None recommended by OT    Recommendations for Other Services      Precautions / Restrictions Precautions Precautions: Fall Precaution Comments: incontinence Restrictions Weight Bearing Restrictions: No       Mobility Bed Mobility                    Transfers                         Balance                                           ADL either performed or assessed with clinical judgement   ADL Overall ADL's : Needs assistance/impaired   Eating/Feeding Details (indicate cue type and reason): patient was able to take drink from therapist and bring to mouth with no spillage. patient was unable to tolerate straightening BLE from flexed posture sitting in chair posiiton in bed. patient unable to get tray infront of patient for self feeding tasks. patients niece and husband in room were educated on importance of limiting distractions and trying to get patient to participate in tasks v.s. completing for her. patients niece and husband verbalized understanding. patient was not receptive to hand over hand attempts at self feeding with spoon on this date.  Extremity/Trunk Assessment              Vision       Perception     Praxis      Cognition Arousal/Alertness: Awake/alert Behavior During Therapy: Flat affect Overall Cognitive Status: History of cognitive impairments - at baseline                                 General Comments: patient was noted to have occasional ability to follow directions during session. noted to repeat herself and  others in room        Exercises      Shoulder Instructions       General Comments      Pertinent Vitals/ Pain       Pain Assessment Pain Assessment: Faces Faces Pain Scale: Hurts a little bit Pain Location: with guidance to straighten BLE Pain Descriptors / Indicators: Grimacing, Guarding Pain Intervention(s): Monitored during session  Home Living                                          Prior Functioning/Environment              Frequency  Min 2X/week        Progress Toward Goals  OT Goals(current goals can now be found in the care plan section)  Progress towards OT goals: Progressing toward goals     Plan Discharge plan remains appropriate    Co-evaluation                 AM-PAC OT "6 Clicks" Daily Activity     Outcome Measure   Help from another person eating meals?: A Lot Help from another person taking care of personal grooming?: A Lot Help from another person toileting, which includes using toliet, bedpan, or urinal?: Total Help from another person bathing (including washing, rinsing, drying)?: A Lot Help from another person to put on and taking off regular upper body clothing?: A Lot Help from another person to put on and taking off regular lower body clothing?: Total 6 Click Score: 10    End of Session    OT Visit Diagnosis: Muscle weakness (generalized) (M62.81);History of falling (Z91.81);Feeding difficulties (R63.3)   Activity Tolerance Patient tolerated treatment well   Patient Left in bed;with call bell/phone within reach;with family/visitor present   Nurse Communication Mobility status        Time: 9563-8756 OT Time Calculation (min): 24 min  Charges: OT General Charges $OT Visit: 1 Visit OT Treatments $Self Care/Home Management : 23-37 mins  Rennie Plowman, MS Acute Rehabilitation Department Office# [phone]   Marcellina Millin 07/07/2022, 3:55 PM

## 2022-07-08 DIAGNOSIS — E43 Unspecified severe protein-calorie malnutrition: Secondary | ICD-10-CM | POA: Diagnosis not present

## 2022-07-08 DIAGNOSIS — R531 Weakness: Secondary | ICD-10-CM | POA: Diagnosis not present

## 2022-07-08 DIAGNOSIS — N3000 Acute cystitis without hematuria: Secondary | ICD-10-CM | POA: Diagnosis not present

## 2022-07-08 DIAGNOSIS — R627 Adult failure to thrive: Secondary | ICD-10-CM | POA: Diagnosis not present

## 2022-07-08 NOTE — Discharge Summary (Signed)
Physician Discharge Summary   Patient: Donna Simmons MRN: [id number] DOB: 1941-01-18  Admit date:     06/30/2022  Discharge date: 07/08/22  Discharge Physician: Hosie Poisson   PCP: [name], DO   Recommendations at discharge:  Please follow up with PCp in one week Please follow up with Geriatric physician/ Neurology for evaluation of dementia.  She would benefit from outpatient palliative care services.  Please check cbc and bmp in one week.   Discharge Diagnoses: Principal Problem:   Generalized weakness Active Problems:   Pressure injury of skin   Protein-calorie malnutrition, severe    Hospital Course: Donna Simmons is a 81 year old female with PMH Alzheimer's dementia with behavioral disturbance, permanent A-fib, HTN, HLD who presented with worsening weakness and falls over the past approximately 2 months. UA was positive for pyuria and empiric Rocephin was initiated for presented UTI.  Due to failure to thrive and recurrent falls, EDP requested admission.  The patient was admitted by [facility], hospitalist service. Completed treatment for UTI, awaiting SNF placement  Assessment and Plan: Generalized weakness, multifactorial secondary to advanced dementia, poor oral intake, dehydration, presumptive UTI.  E coli UTI.  Completed 4 days of antibiotics.  She appears to be back to baseline .  Discussed with niece at bedside. Will probably need long term placement / memory care unit after SNF.        Failure to thrive in an adult:  PT therapy eval recommending SNF.      Severe protein calorie malnutrition;  Dietary consulted.        Permanent Atrial Fibrillation;  Rate controlled with toprol XL  Resume eliquis for CVA PREVENTION.        Hyperlipidemia: resume Crestor.      GERD:  Resume PPI.        RN Pressure Injury Documentation:     Pressure Injury 07/01/22 Coccyx Right;Left Stage 2 -  Partial thickness loss of dermis presenting as a shallow open  injury with a red, pink wound bed without slough. (Active)  07/01/22 1405  Location: Coccyx  Location Orientation: Right;Left  Staging: Stage 2 -  Partial thickness loss of dermis presenting as a shallow open injury with a red, pink wound bed without slough.  Wound Description (Comments):   Present on Admission: Yes  Dressing Type Foam - Lift dressing to assess site every shift 07/06/22 0831    Wound care consulted and recommendations given.    Interventions: Ensure Enlive (each supplement provides 350kcal and 20 grams of protein), Liberalize Diet   Estimated body mass index is 20.24 kg/m as calculated from the following:   Height as of this encounter: 5\' 2"  (1.575 m).   Weight as of this encounter: 50.2 kg.    Consultants: none.  Procedures performed: none.   Disposition: Home Diet recommendation:  Regular diet DISCHARGE MEDICATION: Allergies as of 07/08/2022       Reactions   Antihistamines, Chlorpheniramine-type Palpitations   Tachycardia         Medication List     STOP taking these medications    rosuvastatin 20 MG tablet Commonly known as: CRESTOR       TAKE these medications    acetaminophen 325 MG tablet Commonly known as: TYLENOL Take 2 tablets (650 mg total) by mouth every 6 (six) hours as needed for mild pain, fever or headache.   Eliquis 5 MG Tabs tablet Generic drug: apixaban Take 1 tablet by mouth twice daily What changed: how much to take  Ensure Take 237 mLs by mouth 2 (two) times daily as needed (meal supplement). Vanilla   flecainide 50 MG tablet Commonly known as: TAMBOCOR TAKE 1 TABLET BY MOUTH TWICE DAILY. APPOINTMENT REQUIRED FOR FUTURE REFILLS What changed: See the new instructions.   melatonin 5 MG Tabs Take 1 tablet (5 mg total) by mouth at bedtime as needed.   metoprolol succinate 25 MG 24 hr tablet Commonly known as: TOPROL-XL Take 0.5 tablets (12.5 mg total) by mouth daily. What changed:  medication strength See the  new instructions.   omeprazole 40 MG capsule Commonly known as: PRILOSEC Take 40 mg by mouth daily.   OVER THE COUNTER MEDICATION Take 1 tablet by mouth daily. OTC Potassium supplement..   polyethylene glycol 17 g packet Commonly known as: MIRALAX / GLYCOLAX Take 17 g by mouth daily as needed for mild constipation.   traZODone 50 MG tablet Commonly known as: DESYREL Take 1 tablet (50 mg total) by mouth at bedtime.   VITAMIN D-3 PO Take 1 capsule by mouth daily.        Contact information for after-discharge care     Destination     [facility] Preferred SNF .   Service: Skilled Nursing Contact information: [address] [place] [phone]                    Discharge Exam: Danley Danker Weights   07/03/22 0900  Weight: 50.2 kg   General exam: Appears calm and comfortable  Respiratory system: Clear to auscultation. Respiratory effort normal. Cardiovascular system: S1 & S2 heard, RRR. No JVD, murmurs, Gastrointestinal system: Abdomen is nondistended, soft and nontender.  Normal bowel sounds heard. Central nervous system: Alert and oriented to person only.  Extremities: Symmetric 5 x 5 power. Skin: No rashes, lesions or ulcers Psychiatry: Judgement and insight appear normal. Mood & affect appropriate.    Condition at discharge: fair  The results of significant diagnostics from this hospitalization (including imaging, microbiology, ancillary and laboratory) are listed below for reference.   Imaging Studies: DG Knee Complete 4 Views Left  Result Date: 06/30/2022 CLINICAL DATA:  Fall, weakness EXAM: LEFT KNEE - COMPLETE 4+ VIEW COMPARISON:  04/26/2018 FINDINGS: No evidence of fracture, dislocation, or joint effusion. Mild tricompartmental osteoarthritis, most pronounced within the medial compartment. Soft tissues are unremarkable. IMPRESSION: Negative. Electronically Signed   By: Davina Poke D.O.   On:  06/30/2022 19:50   DG Knee Complete 4 Views Right  Result Date: 06/30/2022 CLINICAL DATA:  Fall, weakness EXAM: RIGHT KNEE - COMPLETE 4+ VIEW COMPARISON:  None Available. FINDINGS: The knee is substantially flexed on imaging, reportedly the patient is contracted and straightening the knee was not feasible. This results in nonstandard orientation and some reduction in diagnostic sensitivity and specificity. No fracture or acute bony findings. No definite knee effusion. Mild marginal spurring of the patella. IMPRESSION: 1. No fracture or acute bony findings identified. 2. Mild marginal spurring of the patella. 3. Nonstandard positioning due to patient contracture. Electronically Signed   By: Van Clines M.D.   On: 06/30/2022 19:49   DG Pelvis 1-2 Views  Result Date: 06/30/2022 CLINICAL DATA:  Fall. EXAM: PELVIS - 1-2 VIEW COMPARISON:  Pelvis x-ray 08/06/2021 FINDINGS: There is no evidence of pelvic fracture or diastasis. No pelvic bone lesions are seen. Soft tissues are within normal limits. IMPRESSION: No fracture or dislocation. Electronically Signed   By: Ronney Asters M.D.   On: 06/30/2022 19:47  DG Chest 2 View  Result Date: 06/30/2022 CLINICAL DATA:  Fall EXAM: CHEST - 2 VIEW COMPARISON:  Chest x-ray 12/03/2021 FINDINGS: Patient's chin overlies the left lung apex which limits evaluation. Cardiomediastinal silhouette is within normal limits. There is some minimal patchy opacities in the left lung base. There is no pleural effusion. There is no large pneumothorax. No acute fractures are seen. IMPRESSION: Limited evaluation. Minimal patchy opacities in the left lung base could represent atelectasis or infection. Electronically Signed   By: Ronney Asters M.D.   On: 06/30/2022 19:45   CT HEAD WO CONTRAST (5MM)  Result Date: 06/30/2022 CLINICAL DATA:  Unable to walk/ambulate. EXAM: CT HEAD WITHOUT CONTRAST CT CERVICAL SPINE WITHOUT CONTRAST TECHNIQUE: Multidetector CT imaging of the head  and cervical spine was performed following the standard protocol without intravenous contrast. Multiplanar CT image reconstructions of the cervical spine were also generated. RADIATION DOSE REDUCTION: This exam was performed according to the departmental dose-optimization program which includes automated exposure control, adjustment of the mA and/or kV according to patient size and/or use of iterative reconstruction technique. COMPARISON:  04/24/2022 FINDINGS: CT HEAD FINDINGS Brain: Nonstandard positioning resulting in somewhat distorted images. This lowers diagnostic sensitivity and specificity. Stable ex vacuo prominence of the lateral ventricles. Periventricular white matter and corona radiata hypodensities favor chronic ischemic microvascular white matter disease. Otherwise, the brainstem, cerebellum, cerebral peduncles, thalamus, basal ganglia, basilar cisterns, and ventricular system appear within normal limits. No intracranial hemorrhage, mass lesion, or acute CVA. Vascular: Unremarkable Skull: Unremarkable Sinuses/Orbits: Chronic ethmoid and maxillary sinusitis. Other: No supplemental non-categorized findings. CT CERVICAL SPINE FINDINGS Alignment: The neck is substantially flexed. No significant abnormal subluxation. Skull base and vertebrae: Dental cavities noted as on image 59 series 9. No cervical spine fracture or acute bony findings identified. Soft tissues and spinal canal: Unremarkable Disc levels:  No osseous impingement identified. Upper chest: Biapical pleuroparenchymal scarring. Other: No supplemental non-categorized findings. IMPRESSION: 1. No acute intracranial findings or acute cervical spine findings. 2. Periventricular white matter and corona radiata hypodensities favor chronic ischemic microvascular white matter disease. 3. Substantial flexion of the cervical spine, query antecollis. 4. Chronic ethmoid and maxillary sinusitis. 5. Dental cavities. Electronically Signed   By: Van Clines M.D.   On: 06/30/2022 19:33   CT Cervical Spine Wo Contrast  Result Date: 06/30/2022 CLINICAL DATA:  Unable to walk/ambulate. EXAM: CT HEAD WITHOUT CONTRAST CT CERVICAL SPINE WITHOUT CONTRAST TECHNIQUE: Multidetector CT imaging of the head and cervical spine was performed following the standard protocol without intravenous contrast. Multiplanar CT image reconstructions of the cervical spine were also generated. RADIATION DOSE REDUCTION: This exam was performed according to the departmental dose-optimization program which includes automated exposure control, adjustment of the mA and/or kV according to patient size and/or use of iterative reconstruction technique. COMPARISON:  04/24/2022 FINDINGS: CT HEAD FINDINGS Brain: Nonstandard positioning resulting in somewhat distorted images. This lowers diagnostic sensitivity and specificity. Stable ex vacuo prominence of the lateral ventricles. Periventricular white matter and corona radiata hypodensities favor chronic ischemic microvascular white matter disease. Otherwise, the brainstem, cerebellum, cerebral peduncles, thalamus, basal ganglia, basilar cisterns, and ventricular system appear within normal limits. No intracranial hemorrhage, mass lesion, or acute CVA. Vascular: Unremarkable Skull: Unremarkable Sinuses/Orbits: Chronic ethmoid and maxillary sinusitis. Other: No supplemental non-categorized findings. CT CERVICAL SPINE FINDINGS Alignment: The neck is substantially flexed. No significant abnormal subluxation. Skull base and vertebrae: Dental cavities noted as on image 59 series 9. No cervical spine fracture or acute bony findings identified.  Soft tissues and spinal canal: Unremarkable Disc levels:  No osseous impingement identified. Upper chest: Biapical pleuroparenchymal scarring. Other: No supplemental non-categorized findings. IMPRESSION: 1. No acute intracranial findings or acute cervical spine findings. 2. Periventricular white matter and  corona radiata hypodensities favor chronic ischemic microvascular white matter disease. 3. Substantial flexion of the cervical spine, query antecollis. 4. Chronic ethmoid and maxillary sinusitis. 5. Dental cavities. Electronically Signed   By: Gaylyn Rong M.D.   On: 06/30/2022 19:33    Microbiology: Results for orders placed or performed during the hospital encounter of 06/30/22  Resp Panel by RT-PCR (Flu A&B, Covid) Anterior Nasal Swab     Status: None   Collection Time: 06/30/22  6:50 PM   Specimen: Anterior Nasal Swab  Result Value Ref Range Status   SARS Coronavirus 2 by RT PCR NEGATIVE NEGATIVE Final    Comment: (NOTE) SARS-CoV-2 target nucleic acids are NOT DETECTED.  The SARS-CoV-2 RNA is generally detectable in upper respiratory specimens during the acute phase of infection. The lowest concentration of SARS-CoV-2 viral copies this assay can detect is 138 copies/mL. A negative result does not preclude SARS-Cov-2 infection and should not be used as the sole basis for treatment or other patient management decisions. A negative result may occur with  improper specimen collection/handling, submission of specimen other than nasopharyngeal swab, presence of viral mutation(s) within the areas targeted by this assay, and inadequate number of viral copies(<138 copies/mL). A negative result must be combined with clinical observations, patient history, and epidemiological information. The expected result is Negative.  Fact Sheet for Patients:  BloggerCourse.com  Fact Sheet for Healthcare Providers:  SeriousBroker.it  This test is no t yet approved or cleared by the [place] FDA and  has been authorized for detection and/or diagnosis of SARS-CoV-2 by FDA under an Emergency Use Authorization (EUA). This EUA will remain  in effect (meaning this test can be used) for the duration of the COVID-19 declaration under Section  564(b)(1) of the Act, 21 U.S.C.section 360bbb-3(b)(1), unless the authorization is terminated  or revoked sooner.       Influenza A by PCR NEGATIVE NEGATIVE Final   Influenza B by PCR NEGATIVE NEGATIVE Final    Comment: (NOTE) The Xpert Xpress SARS-CoV-2/FLU/RSV plus assay is intended as an aid in the diagnosis of influenza from Nasopharyngeal swab specimens and should not be used as a sole basis for treatment. Nasal washings and aspirates are unacceptable for Xpert Xpress SARS-CoV-2/FLU/RSV testing.  Fact Sheet for Patients: BloggerCourse.com  Fact Sheet for Healthcare Providers: SeriousBroker.it  This test is not yet approved or cleared by the [place] FDA and has been authorized for detection and/or diagnosis of SARS-CoV-2 by FDA under an Emergency Use Authorization (EUA). This EUA will remain in effect (meaning this test can be used) for the duration of the COVID-19 declaration under Section 564(b)(1) of the Act, 21 U.S.C. section 360bbb-3(b)(1), unless the authorization is terminated or revoked.  Performed at [facility], [address], [place], [place] [address]   Urine Culture     Status: Abnormal   Collection Time: 06/30/22  9:51 PM   Specimen: Urine, Clean Catch  Result Value Ref Range Status   Specimen Description   Final    URINE, CLEAN CATCH Performed at [facility], [address] [place], [place] [address]    Special Requests   Final    NONE Performed at [facility], [address], [place], [place] [address]  Culture >=100,000 COLONIES/mL ESCHERICHIA COLI (A)  Final   Report Status 07/03/2022 FINAL  Final   Organism ID, Bacteria ESCHERICHIA COLI (A)  Final      Susceptibility   Escherichia coli - MIC*    AMPICILLIN 8 SENSITIVE Sensitive     CEFAZOLIN <=4 SENSITIVE Sensitive     CEFEPIME <=0.12 SENSITIVE Sensitive     CEFTRIAXONE  <=0.25 SENSITIVE Sensitive     CIPROFLOXACIN <=0.25 SENSITIVE Sensitive     GENTAMICIN <=1 SENSITIVE Sensitive     IMIPENEM <=0.25 SENSITIVE Sensitive     NITROFURANTOIN <=16 SENSITIVE Sensitive     TRIMETH/SULFA <=20 SENSITIVE Sensitive     AMPICILLIN/SULBACTAM 4 SENSITIVE Sensitive     PIP/TAZO <=4 SENSITIVE Sensitive     * >=100,000 COLONIES/mL ESCHERICHIA COLI    Labs: CBC: Recent Labs  Lab 07/02/22 0451 07/03/22 0458 07/04/22 0437 07/05/22 0542 07/06/22 0414  WBC 7.3 6.5 4.4 5.3 4.7  NEUTROABS 5.8 4.9 3.1 3.8 3.0  HGB 11.2* 11.7* 12.1 11.4* 11.4*  HCT 35.1* 37.2 38.0 35.7* 35.2*  MCV 81.1 81.2 81.4 80.4 79.8*  PLT 185 199 234 232 [id number]    Basic Metabolic Panel: Recent Labs  Lab 07/02/22 0451 07/03/22 0458 07/04/22 0437 07/05/22 0542 07/06/22 0414  NA 141 138 138 135 140  K 4.0 3.6 3.7 3.6 4.3  CL 109 104 101 100 105  CO2 25 26 27 26 29   GLUCOSE 101* 90 87 91 95  BUN 9 10 10 15 13   CREATININE 0.70 0.61 0.73 0.76 0.70  CALCIUM 8.6* 8.7* 8.5* 8.5* 8.9  MG 1.9 1.7 2.0 2.0 2.3    Liver Function Tests: No results for input(s): "AST", "ALT", "ALKPHOS", "BILITOT", "PROT", "ALBUMIN" in the last 168 hours.  CBG: No results for input(s): "GLUCAP" in the last 168 hours.  Discharge time spent: 42 minutes.   Signed: Hosie Poisson, MD [facility] 07/08/2022

## 2022-07-08 NOTE — Progress Notes (Signed)
Attempted to call [facility] SNF [phone] to give report. Told to call back because nurse was not available. Will retry call @ 1220

## 2022-07-08 NOTE — TOC Transition Note (Signed)
Transition of Care [facility]) - CM/SW Discharge Note   Patient Details  Name: Donna Simmons MRN: [id number] Date of Birth: December 24, 1940  [facility] CM/SW Contact:  Lennart Pall, LCSW Phone Number: 07/08/2022, 11:33 AM   Clinical Narrative:    Have received insurance auth for SNF with bed accepted at [facility].  Pt medically cleared for dc today.  Spouse aware/ agreeable. PTAR called at 11:35am.  RN to call report to [phone].  No further TOC needs.   Final next level of care: Skilled Nursing Facility Barriers to Discharge: Barriers Resolved   Patient Goals and CMS Choice Patient states their goals for this hospitalization and ongoing recovery are::  (Rehab) CMS Medicare.gov Compare Post Acute Care list provided to:: Patient Represenative (must comment) ([place](spouse)) Choice offered to / list presented to : Spouse  Discharge Placement   Existing PASRR number confirmed : 07/02/22          Patient chooses bed at: [facility] Patient to be transferred to facility by: [place] Name of family member notified: spouse Patient and family notified of of transfer: 07/08/22  Discharge Plan and Services   Discharge Planning Services: CM Consult Post Acute Care Choice: [place]          DME Arranged: N/A DME Agency: NA                  Social Determinants of Health (SDOH) Interventions Housing Interventions: Inpatient TOC   Readmission Risk Interventions     No data to display

## 2022-07-08 NOTE — Progress Notes (Signed)
Attempted to call [facility] SNF again to give report on incoming Room 303. Receptionist stated no one picking up because they are at lunch. Will send D/C instructions with PTAR.

## 2022-08-20 ENCOUNTER — Encounter: Payer: Self-pay | Admitting: Nurse Practitioner

## 2022-08-20 ENCOUNTER — Ambulatory Visit: Payer: Medicare PPO | Attending: Nurse Practitioner | Admitting: Nurse Practitioner

## 2022-08-20 VITALS — BP 108/78 | HR 97

## 2022-08-20 DIAGNOSIS — G309 Alzheimer's disease, unspecified: Secondary | ICD-10-CM

## 2022-08-20 DIAGNOSIS — F0280 Dementia in other diseases classified elsewhere without behavioral disturbance: Secondary | ICD-10-CM

## 2022-08-20 DIAGNOSIS — R600 Localized edema: Secondary | ICD-10-CM | POA: Diagnosis not present

## 2022-08-20 DIAGNOSIS — I10 Essential (primary) hypertension: Secondary | ICD-10-CM

## 2022-08-20 DIAGNOSIS — E785 Hyperlipidemia, unspecified: Secondary | ICD-10-CM

## 2022-08-20 DIAGNOSIS — I4821 Permanent atrial fibrillation: Secondary | ICD-10-CM

## 2022-08-20 NOTE — Patient Instructions (Signed)
Medication Instructions:  Your physician recommends that you continue on your current medications as directed. Please refer to the Current Medication list given to you today.   *If you need a refill on your cardiac medications before your next appointment, please call your pharmacy*   Lab Work: NONE ordered at this time of appointment   If you have labs (blood work) drawn today and your tests are completely normal, you will receive your results only by: MyChart Message (if you have MyChart) OR A paper copy in the mail If you have any lab test that is abnormal or we need to change your treatment, we will call you to review the results.   Testing/Procedures: NONE ordered at this time of appointment     Follow-Up: At [facility], you and your health needs are our priority.  As part of our continuing mission to provide you with exceptional heart care, we have created designated Provider Care Teams.  These Care Teams include your primary Cardiologist (physician) and Advanced Practice Providers (APPs -  Physician Assistants and Nurse Practitioners) who all work together to provide you with the care you need, when you need it.  We recommend signing up for the patient portal called "MyChart".  Sign up information is provided on this After Visit Summary.  MyChart is used to connect with patients for Virtual Visits (Telemedicine).  Patients are able to view lab/test results, encounter notes, upcoming appointments, etc.  Non-urgent messages can be sent to your provider as well.   To learn more about what you can do with MyChart, go to [email or web address]://[email or web address].com.    Your next appointment:   6 month(s)  The format for your next appointment:   In Person  Provider:   David Harding, MD     Other Instructions   Important Information About Sugar       

## 2022-08-20 NOTE — Progress Notes (Signed)
Office Visit    Patient Name: Donna Simmons Date of Encounter: 08/20/2022  Primary Care Provider:  Yolanda Manges, DO Primary Cardiologist:  Bryan Lemma, MD  Chief Complaint    81 year old female with history of paroxysmal atrial fibrillation, hypertension, hyperlipidemia, Alzheimer's dementia, failure to thrive who presents for follow-up related to atrial fibrillation.  Past Medical History    Past Medical History:  Diagnosis Date   Allergy    GERD (gastroesophageal reflux disease)    Heart murmur    No significant valvular lesion noted on echo.   Hypercholesteremia    Hypertension    Memory loss    Paroxysmal atrial fibrillation (HCC) 12/27/2008   Past Surgical History:  Procedure Laterality Date   ABDOMINAL HYSTERECTOMY  1980   BREAST SURGERY     CT CTA CORONARY W/CA SCORE W/CM &/OR WO/CM  08/2018   Coronary calcium score 24.  Nonobstructive CAD with less than 30% plaque in proximal LAD.  Normal aortic root. ->  Relook in October 2020 had significant motion artifact but coronary calcium score 26.   EYE SURGERY Right 12/29/2016   EYE SURGERY Left 01/19/2017   FRACTURE SURGERY     LEFT HEART CATH AND CORONARY ANGIOGRAPHY  12/16/2010   no evidence of CAD to explain anginal pain w/ positive troponin.  potential etiology is breakthrough AF   NM MYOVIEW LTD  April 2010   EF 64%, normal pattern of perfusion in all regions, no scintigraphic evidence of inducible ischemia; no significant wall motion abnormalities; EKG negative for ischemia; no significant change from last study; low risk scan   TRANSTHORACIC ECHOCARDIOGRAM  07/2018   Normal EF 55-60%.  No RWMA. GR 1 DD. Ao Sclerosis - no Stenosis. Mild AI. Trivial MR.     Allergies  Allergies  Allergen Reactions   Antihistamines, Chlorpheniramine-Type Palpitations    Tachycardia     History of Present Illness    81 year old female with the above past medical history including paroxysmal atrial fibrillation,  hypertension, hyperlipidemia, Alzheimer's dementia, and failure to thrive.   She has a history of paroxysmal atrial fibrillation, on Eliquis.  She also has a history of stable chronic bilateral lower extremity edema. Echocardiogram in 2019 showed EF 55 to 60%, no RWMA, G1 DD, mild aortic valve regurgitation.  Coronary CT angiogram in 06/2019 revealed coronary calcium score of 26 (51st percentile).  She was last seen in the office on 08/19/2021 and was stable overall from a cardiac standpoint.  She did note ongoing bilateral lower extremity edema. Ongoing use of Lasix as well as compression stockings and elevation was recommended. She was hospitalized in November 2023 in the setting of failure to thrive, generalized weakness, poor oral intake, dehydration, and presumptive UTI.  She was discharged to [facility].  She presents today for follow-up.  She comes in today alone.  Patient was brought by facility transportation, no family present, no staff present, no accompanying paperwork for review (current medication list, etc.).  We contacted the facility to request paperwork, however, we did not receive this in time for today's visit.  Patient is essentially nonverbal at today's visit, disoriented to time and place.  She is slow to respond to questions, though when asked if she is in any pain she replies "no."   Home Medications    Current Outpatient Medications  Medication Sig Dispense Refill   acetaminophen (TYLENOL) 325 MG tablet Take 2 tablets (650 mg total) by mouth every 6 (six) hours as needed for  mild pain, fever or headache.     apixaban (ELIQUIS) 5 MG TABS tablet Take 1 tablet by mouth twice daily (Patient taking differently: Take 5 mg by mouth 2 (two) times daily.) 180 tablet 1   Cholecalciferol (VITAMIN D-3 PO) Take 1 capsule by mouth daily.     Ensure (ENSURE) Take 237 mLs by mouth 2 (two) times daily as needed (meal supplement). Vanilla     flecainide (TAMBOCOR) 50 MG tablet TAKE 1 TABLET  BY MOUTH TWICE DAILY. APPOINTMENT REQUIRED FOR FUTURE REFILLS (Patient taking differently: Take 50 mg by mouth 2 (two) times daily.) 180 tablet 3   melatonin 5 MG TABS Take 1 tablet (5 mg total) by mouth at bedtime as needed.  0   metoprolol succinate (TOPROL-XL) 25 MG 24 hr tablet Take 0.5 tablets (12.5 mg total) by mouth daily.     omeprazole (PRILOSEC) 40 MG capsule Take 40 mg by mouth daily.     OVER THE COUNTER MEDICATION Take 1 tablet by mouth daily. OTC Potassium supplement..     polyethylene glycol (MIRALAX / GLYCOLAX) 17 g packet Take 17 g by mouth daily as needed for mild constipation. 14 each 0   traZODone (DESYREL) 50 MG tablet Take 1 tablet (50 mg total) by mouth at bedtime.     No current facility-administered medications for this visit.     Review of Systems    She denies chest pain, palpitations, dyspnea, pnd, orthopnea, n, v, dizziness, syncope, edema, weight gain, or early satiety. All other systems reviewed and are otherwise negative except as noted above.   Physical Exam    VS:  BP 108/78   Pulse 97   SpO2 97%  GEN: Thin, frail, in no acute distress. HEENT: normal. Neck: Supple, no JVD, carotid bruits, or masses. Cardiac: RRR, no murmurs, rubs, or gallops. No clubbing, cyanosis, edema.  Radials/DP/PT 2+ and equal bilaterally.  Respiratory:  Respirations regular and unlabored, clear to auscultation bilaterally. GI: Soft, nontender, nondistended, BS + x 4. MS: no deformity or atrophy. Skin: warm and dry, no rash. Neuro:  Strength and sensation are intact. Psych: Normal affect.  Accessory Clinical Findings    ECG personally reviewed by me today - NSR, 97 bpm - no acute changes.   Lab Results  Component Value Date   WBC 4.7 07/06/2022   HGB 11.4 (L) 07/06/2022   HCT 35.2 (L) 07/06/2022   MCV 79.8 (L) 07/06/2022   PLT 232 07/06/2022   Lab Results  Component Value Date   CREATININE 0.70 07/06/2022   BUN 13 07/06/2022   NA 140 07/06/2022   K 4.3 07/06/2022    CL 105 07/06/2022   CO2 29 07/06/2022   Lab Results  Component Value Date   ALT 23 07/01/2022   AST 65 (H) 07/01/2022   ALKPHOS 40 07/01/2022   BILITOT 1.0 07/01/2022   Lab Results  Component Value Date   CHOL 157 04/04/2020   HDL 71 04/04/2020   LDLCALC 74 04/04/2020   TRIG 60 04/04/2020   CHOLHDL 2.2 04/04/2020    Lab Results  Component Value Date   HGBA1C 5.3 04/04/2020    Assessment & Plan    1. Paroxysmal atrial fibrillation: Maintaining NSR today.  HR is mildly elevated.  Continue flecainide, metoprolol, Eliquis.   2. Hypertension: BP well controlled. Continue current antihypertensive regimen.   3. Hyperlipidemia: No recent LDL on file.  It appears she is not on any medication at this time.  4. Chronic bilateral lower  extremity edema: Not appreciated on exam today.   Generally euvolemic and well compensated.   5. Alzheimer's dementia/Failure to thrive: Progressive.  Patient is frail, appears to have limited mobility, essentially nonverbal during today's visit, currently residing in skilled nursing facility.  She would benefit from palliative care consult for ongoing goals of care discussion.  6. Disposition: Follow-up in 6 months with Dr. Herbie Baltimore.      Joylene Grapes, NP 08/20/2022, 2:11 PM

## 2022-08-21 ENCOUNTER — Other Ambulatory Visit (INDEPENDENT_AMBULATORY_CARE_PROVIDER_SITE_OTHER): Payer: Medicare PPO

## 2022-08-21 DIAGNOSIS — I4821 Permanent atrial fibrillation: Secondary | ICD-10-CM | POA: Diagnosis not present

## 2022-08-25 ENCOUNTER — Inpatient Hospital Stay (HOSPITAL_COMMUNITY)
Admission: EM | Admit: 2022-08-25 | Discharge: 2022-12-19 | DRG: 640 | Disposition: A | Discharge status: Other Acute Inpatient Hospital | Payer: Medicare PPO | Source: Skilled Nursing Facility | Attending: Internal Medicine | Admitting: Internal Medicine

## 2022-08-25 ENCOUNTER — Emergency Department (HOSPITAL_COMMUNITY): Payer: Medicare PPO

## 2022-08-25 ENCOUNTER — Other Ambulatory Visit: Payer: Self-pay

## 2022-08-25 DIAGNOSIS — N390 Urinary tract infection, site not specified: Secondary | ICD-10-CM | POA: Diagnosis not present

## 2022-08-25 DIAGNOSIS — D75839 Thrombocytosis, unspecified: Secondary | ICD-10-CM

## 2022-08-25 DIAGNOSIS — L89890 Pressure ulcer of other site, unstageable: Secondary | ICD-10-CM | POA: Diagnosis not present

## 2022-08-25 DIAGNOSIS — E870 Hyperosmolality and hypernatremia: Secondary | ICD-10-CM | POA: Diagnosis not present

## 2022-08-25 DIAGNOSIS — I4821 Permanent atrial fibrillation: Secondary | ICD-10-CM | POA: Diagnosis present

## 2022-08-25 DIAGNOSIS — F0280 Dementia in other diseases classified elsewhere without behavioral disturbance: Secondary | ICD-10-CM | POA: Diagnosis present

## 2022-08-25 DIAGNOSIS — R54 Age-related physical debility: Secondary | ICD-10-CM | POA: Diagnosis present

## 2022-08-25 DIAGNOSIS — E861 Hypovolemia: Secondary | ICD-10-CM | POA: Diagnosis present

## 2022-08-25 DIAGNOSIS — I129 Hypertensive chronic kidney disease with stage 1 through stage 4 chronic kidney disease, or unspecified chronic kidney disease: Secondary | ICD-10-CM | POA: Diagnosis present

## 2022-08-25 DIAGNOSIS — Z79899 Other long term (current) drug therapy: Secondary | ICD-10-CM

## 2022-08-25 DIAGNOSIS — N182 Chronic kidney disease, stage 2 (mild): Secondary | ICD-10-CM | POA: Diagnosis present

## 2022-08-25 DIAGNOSIS — D509 Iron deficiency anemia, unspecified: Secondary | ICD-10-CM

## 2022-08-25 DIAGNOSIS — K219 Gastro-esophageal reflux disease without esophagitis: Secondary | ICD-10-CM | POA: Diagnosis present

## 2022-08-25 DIAGNOSIS — L89623 Pressure ulcer of left heel, stage 3: Secondary | ICD-10-CM | POA: Diagnosis not present

## 2022-08-25 DIAGNOSIS — G309 Alzheimer's disease, unspecified: Secondary | ICD-10-CM | POA: Diagnosis present

## 2022-08-25 DIAGNOSIS — I2510 Atherosclerotic heart disease of native coronary artery without angina pectoris: Secondary | ICD-10-CM | POA: Diagnosis present

## 2022-08-25 DIAGNOSIS — E860 Dehydration: Secondary | ICD-10-CM | POA: Diagnosis present

## 2022-08-25 DIAGNOSIS — L89154 Pressure ulcer of sacral region, stage 4: Secondary | ICD-10-CM | POA: Diagnosis not present

## 2022-08-25 DIAGNOSIS — Z66 Do not resuscitate: Secondary | ICD-10-CM | POA: Diagnosis present

## 2022-08-25 DIAGNOSIS — Z751 Person awaiting admission to adequate facility elsewhere: Secondary | ICD-10-CM

## 2022-08-25 DIAGNOSIS — E8809 Other disorders of plasma-protein metabolism, not elsewhere classified: Secondary | ICD-10-CM | POA: Diagnosis present

## 2022-08-25 DIAGNOSIS — R310 Gross hematuria: Secondary | ICD-10-CM | POA: Diagnosis not present

## 2022-08-25 DIAGNOSIS — Z681 Body mass index (BMI) 19 or less, adult: Secondary | ICD-10-CM

## 2022-08-25 DIAGNOSIS — E876 Hypokalemia: Secondary | ICD-10-CM

## 2022-08-25 DIAGNOSIS — Z8249 Family history of ischemic heart disease and other diseases of the circulatory system: Secondary | ICD-10-CM

## 2022-08-25 DIAGNOSIS — Z6832 Body mass index (BMI) 32.0-32.9, adult: Secondary | ICD-10-CM

## 2022-08-25 DIAGNOSIS — D72829 Elevated white blood cell count, unspecified: Secondary | ICD-10-CM | POA: Diagnosis not present

## 2022-08-25 DIAGNOSIS — B9620 Unspecified Escherichia coli [E. coli] as the cause of diseases classified elsewhere: Secondary | ICD-10-CM | POA: Diagnosis not present

## 2022-08-25 DIAGNOSIS — S9030XA Contusion of unspecified foot, initial encounter: Secondary | ICD-10-CM | POA: Diagnosis present

## 2022-08-25 DIAGNOSIS — L89893 Pressure ulcer of other site, stage 3: Secondary | ICD-10-CM | POA: Diagnosis not present

## 2022-08-25 DIAGNOSIS — N179 Acute kidney failure, unspecified: Secondary | ICD-10-CM | POA: Diagnosis present

## 2022-08-25 DIAGNOSIS — E46 Unspecified protein-calorie malnutrition: Secondary | ICD-10-CM

## 2022-08-25 DIAGNOSIS — Z1612 Extended spectrum beta lactamase (ESBL) resistance: Secondary | ICD-10-CM | POA: Diagnosis not present

## 2022-08-25 DIAGNOSIS — R6251 Failure to thrive (child): Secondary | ICD-10-CM | POA: Diagnosis present

## 2022-08-25 DIAGNOSIS — Z7401 Bed confinement status: Secondary | ICD-10-CM

## 2022-08-25 DIAGNOSIS — Z7901 Long term (current) use of anticoagulants: Secondary | ICD-10-CM

## 2022-08-25 DIAGNOSIS — Z5986 Financial insecurity: Secondary | ICD-10-CM

## 2022-08-25 DIAGNOSIS — Z8261 Family history of arthritis: Secondary | ICD-10-CM

## 2022-08-25 DIAGNOSIS — E43 Unspecified severe protein-calorie malnutrition: Secondary | ICD-10-CM | POA: Diagnosis present

## 2022-08-25 DIAGNOSIS — W19XXXD Unspecified fall, subsequent encounter: Secondary | ICD-10-CM | POA: Diagnosis present

## 2022-08-25 DIAGNOSIS — L89216 Pressure-induced deep tissue damage of right hip: Secondary | ICD-10-CM | POA: Diagnosis not present

## 2022-08-25 DIAGNOSIS — R627 Adult failure to thrive: Secondary | ICD-10-CM | POA: Diagnosis present

## 2022-08-25 DIAGNOSIS — R000 Tachycardia, unspecified: Secondary | ICD-10-CM | POA: Diagnosis not present

## 2022-08-25 DIAGNOSIS — L8990 Pressure ulcer of unspecified site, unspecified stage: Secondary | ICD-10-CM | POA: Diagnosis present

## 2022-08-25 DIAGNOSIS — G4700 Insomnia, unspecified: Secondary | ICD-10-CM | POA: Diagnosis present

## 2022-08-25 DIAGNOSIS — E785 Hyperlipidemia, unspecified: Secondary | ICD-10-CM | POA: Diagnosis present

## 2022-08-25 DIAGNOSIS — Z515 Encounter for palliative care: Secondary | ICD-10-CM

## 2022-08-25 DIAGNOSIS — R410 Disorientation, unspecified: Secondary | ICD-10-CM | POA: Diagnosis not present

## 2022-08-25 DIAGNOSIS — Z888 Allergy status to other drugs, medicaments and biological substances status: Secondary | ICD-10-CM

## 2022-08-25 DIAGNOSIS — R7309 Other abnormal glucose: Secondary | ICD-10-CM

## 2022-08-25 DIAGNOSIS — R7401 Elevation of levels of liver transaminase levels: Secondary | ICD-10-CM

## 2022-08-25 DIAGNOSIS — I959 Hypotension, unspecified: Secondary | ICD-10-CM | POA: Diagnosis not present

## 2022-08-25 DIAGNOSIS — R64 Cachexia: Secondary | ICD-10-CM | POA: Diagnosis present

## 2022-08-25 DIAGNOSIS — F02818 Dementia in other diseases classified elsewhere, unspecified severity, with other behavioral disturbance: Secondary | ICD-10-CM | POA: Diagnosis present

## 2022-08-25 HISTORY — DX: Dementia in other diseases classified elsewhere, unspecified severity, without behavioral disturbance, psychotic disturbance, mood disturbance, and anxiety: F02.80

## 2022-08-25 LAB — BASIC METABOLIC PANEL
Anion gap: 6 (ref 5–15)
Anion gap: 6 (ref 5–15)
BUN: 30 mg/dL — ABNORMAL HIGH (ref 8–23)
BUN: 28 mg/dL — ABNORMAL HIGH (ref 8–23)
BUN: 27 mg/dL — ABNORMAL HIGH (ref 8–23)
CO2: 28 mmol/L (ref 22–32)
CO2: 28 mmol/L (ref 22–32)
CO2: 29 mmol/L (ref 22–32)
Calcium: 8.3 mg/dL — ABNORMAL LOW (ref 8.9–10.3)
Calcium: 8.3 mg/dL — ABNORMAL LOW (ref 8.9–10.3)
Calcium: 8.5 mg/dL — ABNORMAL LOW (ref 8.9–10.3)
Chloride: 128 mmol/L — ABNORMAL HIGH (ref 98–111)
Chloride: >130 mmol/L (ref 98–111)
Chloride: 128 mmol/L — ABNORMAL HIGH (ref 98–111)
Creatinine, Ser: 0.89 mg/dL (ref 0.44–1.00)
Creatinine, Ser: 0.75 mg/dL (ref 0.44–1.00)
Creatinine, Ser: 0.78 mg/dL (ref 0.44–1.00)
GFR, Estimated: >60 mL/min (ref >60)
GFR, Estimated: >60 mL/min (ref >60)
GFR, Estimated: >60 mL/min (ref >60)
Glucose, Bld: 116 mg/dL — ABNORMAL HIGH (ref 70–99)
Glucose, Bld: 112 mg/dL — ABNORMAL HIGH (ref 70–99)
Glucose, Bld: 127 mg/dL — ABNORMAL HIGH (ref 70–99)
Potassium: 3.6 mmol/L (ref 3.5–5.1)
Potassium: 3.1 mmol/L — ABNORMAL LOW (ref 3.5–5.1)
Potassium: 2.9 mmol/L — ABNORMAL LOW (ref 3.5–5.1)
Sodium: 162 mmol/L (ref 135–145)
Sodium: 167 mmol/L (ref 135–145)
Sodium: 163 mmol/L (ref 135–145)

## 2022-08-25 LAB — CBC WITH DIFFERENTIAL/PLATELET
Abs Immature Granulocytes: 0.06 10*3/uL (ref 0.00–0.07)
Basophils Absolute: 0 10*3/uL (ref 0.0–0.1)
Basophils Relative: 0 %
Eosinophils Absolute: 0 10*3/uL (ref 0.0–0.5)
Eosinophils Relative: 0 %
HCT: 44 % (ref 36.0–46.0)
Hemoglobin: 13.2 g/dL (ref 12.0–15.0)
Immature Granulocytes: 1 %
Lymphocytes Relative: 6 %
Lymphs Abs: 0.7 10*3/uL (ref 0.7–4.0)
MCH: 25.3 pg — ABNORMAL LOW (ref 26.0–34.0)
MCHC: 30 g/dL (ref 30.0–36.0)
MCV: 84.3 fL (ref 80.0–100.0)
Monocytes Absolute: 0.4 10*3/uL (ref 0.1–1.0)
Monocytes Relative: 3 %
Neutro Abs: 11.2 10*3/uL — ABNORMAL HIGH (ref 1.7–7.7)
Neutrophils Relative %: 90 %
Platelets: 449 10*3/uL — ABNORMAL HIGH (ref 150–400)
RBC: 5.22 MIL/uL — ABNORMAL HIGH (ref 3.87–5.11)
RDW: 16.1 % — ABNORMAL HIGH (ref 11.5–15.5)
WBC: 12.4 10*3/uL — ABNORMAL HIGH (ref 4.0–10.5)
nRBC: 0 % (ref 0.0–0.2)

## 2022-08-25 LAB — COMPREHENSIVE METABOLIC PANEL
ALT: 91 U/L — ABNORMAL HIGH (ref 0–44)
AST: 79 U/L — ABNORMAL HIGH (ref 15–41)
Albumin: 2.3 g/dL — ABNORMAL LOW (ref 3.5–5.0)
Alkaline Phosphatase: 87 U/L (ref 38–126)
Anion gap: 15 (ref 5–15)
BUN: 31 mg/dL — ABNORMAL HIGH (ref 8–23)
CO2: 29 mmol/L (ref 22–32)
Calcium: 9.2 mg/dL (ref 8.9–10.3)
Chloride: 122 mmol/L — ABNORMAL HIGH (ref 98–111)
Creatinine, Ser: 1.19 mg/dL — ABNORMAL HIGH (ref 0.44–1.00)
GFR, Estimated: 47 mL/min — ABNORMAL LOW (ref >60)
Glucose, Bld: 118 mg/dL — ABNORMAL HIGH (ref 70–99)
Potassium: 3.4 mmol/L — ABNORMAL LOW (ref 3.5–5.1)
Sodium: 166 mmol/L (ref 135–145)
Total Bilirubin: 1.1 mg/dL (ref 0.3–1.2)
Total Protein: 7.2 g/dL (ref 6.5–8.1)

## 2022-08-25 MED ORDER — PANTOPRAZOLE SODIUM 40 MG PO TBEC
40.0000 mg | DELAYED_RELEASE_TABLET | Freq: Every day | ORAL | Status: DC
  Administered 2022-08-28 – 2022-12-19 (×110): 40 mg via ORAL
  Filled 2022-08-25 (×114): qty 1

## 2022-08-25 MED ORDER — APIXABAN 5 MG PO TABS
5.0000 mg | ORAL_TABLET | Freq: Two times a day (BID) | ORAL | Status: DC
  Administered 2022-08-25 – 2022-11-28 (×186): 5 mg via ORAL
  Filled 2022-08-25 (×189): qty 1

## 2022-08-25 MED ORDER — SODIUM CHLORIDE 0.9 % IV BOLUS
1000.0000 mL | Freq: Once | INTRAVENOUS | Status: AC
  Administered 2022-08-25: 1000 mL via INTRAVENOUS

## 2022-08-25 MED ORDER — ONDANSETRON HCL 4 MG/2ML IJ SOLN: 4.0000 mg | Freq: Four times a day (QID) | INTRAMUSCULAR | Status: DC | PRN

## 2022-08-25 MED ORDER — FERROUS SULFATE 325 (65 FE) MG PO TABS
325.0000 mg | ORAL_TABLET | ORAL | Status: DC
  Administered 2022-08-29 – 2022-12-19 (×55): 325 mg via ORAL
  Filled 2022-08-25 (×71): qty 1

## 2022-08-25 MED ORDER — SODIUM CHLORIDE 0.9 % IV SOLN: INTRAVENOUS | Status: DC

## 2022-08-25 MED ORDER — POTASSIUM CHLORIDE CRYS ER 20 MEQ PO TBCR: 40.0000 meq | EXTENDED_RELEASE_TABLET | Freq: Once | ORAL | Status: DC

## 2022-08-25 MED ORDER — ACETAMINOPHEN 325 MG PO TABS
650.0000 mg | ORAL_TABLET | Freq: Four times a day (QID) | ORAL | Status: DC | PRN
  Administered 2022-09-13 – 2022-12-18 (×22): 650 mg via ORAL
  Filled 2022-08-25 (×24): qty 2

## 2022-08-25 MED ORDER — MELATONIN 5 MG PO TABS
5.0000 mg | ORAL_TABLET | Freq: Every evening | ORAL | Status: DC | PRN
  Administered 2022-09-07 – 2022-12-18 (×12): 5 mg via ORAL
  Filled 2022-08-25 (×14): qty 1

## 2022-08-25 MED ORDER — ACETAMINOPHEN 650 MG RE SUPP: 650.0000 mg | Freq: Four times a day (QID) | RECTAL | Status: DC | PRN

## 2022-08-25 MED ORDER — DEXTROSE 5 % IV SOLN: INTRAVENOUS | Status: DC

## 2022-08-25 MED ORDER — FREE WATER
200.0000 mL | Status: DC
  Administered 2022-08-25: 200 mL

## 2022-08-25 MED ORDER — ENSURE ENLIVE PO LIQD
237.0000 mL | Freq: Two times a day (BID) | ORAL | Status: DC
  Administered 2022-08-27 – 2022-09-02 (×9): 237 mL via ORAL
  Filled 2022-08-25: qty 237

## 2022-08-25 MED ORDER — FLECAINIDE ACETATE 50 MG PO TABS
50.0000 mg | ORAL_TABLET | Freq: Two times a day (BID) | ORAL | Status: DC
  Administered 2022-08-25 – 2022-12-19 (×225): 50 mg via ORAL
  Filled 2022-08-25 (×239): qty 1

## 2022-08-25 MED ORDER — ONDANSETRON HCL 4 MG PO TABS: 4.0000 mg | ORAL_TABLET | Freq: Four times a day (QID) | ORAL | Status: DC | PRN

## 2022-08-25 NOTE — ED Provider Notes (Signed)
[facility] EMERGENCY DEPARTMENT Provider Note   CSN: [id number] Arrival date & time: 08/25/22  0114     History  Chief Complaint  Patient presents with   Failure To Thrive    Donna Simmons is a 81 y.o. female.  The history is provided by the EMS personnel and the nursing home. The history is limited by the condition of the patient (Dementia).  She has history of hypertension, hyperlipidemia, dementia, atrial fibrillation anticoagulated on apixaban and is transferred here from a skilled nursing facility where she was noted to have decreased oral intake over the last 3 days, no oral intake in the last 24 hours.  They also report increased lethargy today.  Patient is not able to give any history.  She also has a history of frequent UTIs.   Home Medications Prior to Admission medications   Medication Sig Start Date End Date Taking? Authorizing Provider  acetaminophen (TYLENOL) 325 MG tablet Take 2 tablets (650 mg total) by mouth every 6 (six) hours as needed for mild pain, fever or headache. 07/07/22   Kathlen Mody, MD  apixaban (ELIQUIS) 5 MG TABS tablet Take 1 tablet by mouth twice daily Patient taking differently: Take 5 mg by mouth 2 (two) times daily. 08/08/21   Marykay Lex, MD  Cholecalciferol (VITAMIN D-3 PO) Take 1 capsule by mouth daily.    [provider]  Ensure (ENSURE) Take 237 mLs by mouth 2 (two) times daily as needed (meal supplement). Vanilla    [provider]  flecainide (TAMBOCOR) 50 MG tablet TAKE 1 TABLET BY MOUTH TWICE DAILY. APPOINTMENT REQUIRED FOR FUTURE REFILLS Patient taking differently: Take 50 mg by mouth 2 (two) times daily. 01/15/22   Marykay Lex, MD  melatonin 5 MG TABS Take 1 tablet (5 mg total) by mouth at bedtime as needed. 07/07/22   Kathlen Mody, MD  metoprolol succinate (TOPROL-XL) 25 MG 24 hr tablet Take 0.5 tablets (12.5 mg total) by mouth daily. 07/08/22   Kathlen Mody, MD  omeprazole (PRILOSEC) 40  MG capsule Take 40 mg by mouth daily.    [provider]  OVER THE COUNTER MEDICATION Take 1 tablet by mouth daily. OTC Potassium supplement..    [provider]  polyethylene glycol (MIRALAX / GLYCOLAX) 17 g packet Take 17 g by mouth daily as needed for mild constipation. 07/07/22   Kathlen Mody, MD  traZODone (DESYREL) 50 MG tablet Take 1 tablet (50 mg total) by mouth at bedtime. 07/07/22   Kathlen Mody, MD      Allergies    Antihistamines, chlorpheniramine-type    Review of Systems   Review of Systems  Unable to perform ROS: Dementia    Physical Exam Updated Vital Signs BP 96/82 (BP Location: Right Arm)   Pulse (!) 108   Temp 98.6 F (37 C) (Axillary)   Resp 20   SpO2 99%  Physical Exam Vitals and nursing note reviewed.   Cachectic appearing 81 year old female, resting comfortably and in no acute distress. Vital signs are significant for mildly elevated heart rate and borderline low blood pressure. Oxygen saturation is 99%, which is normal. Head is normocephalic and atraumatic.  Eyes appear somewhat sunken.  She has a gaze preference to the left, will not look to the right although the eyes do appear to past the midline. Oropharynx is clear, but mucous membranes are dry. Neck is nontender and supple without adenopathy or JVD. Back is nontender and there is no  CVA tenderness. Lungs are clear without rales, wheezes, or rhonchi. Chest is nontender. Heart has regular rate and rhythm without murmur. Abdomen is soft, flat, nontender. Extremities have no cyanosis or edema. Skin is warm and dry without rash. Neurologic: Awake but nonverbal and does not respond appropriately to commands, moves all extremities but moves left arm less than right, has strong gaze preference to the right.  Generalized increased motor tone.  ED Results / Procedures / Treatments   Labs (all labs ordered are listed, but only abnormal results are displayed) Labs Reviewed  COMPREHENSIVE  METABOLIC PANEL - Abnormal; Notable for the following components:      Result Value   Sodium 166 (*)    Potassium 3.4 (*)    Chloride 122 (*)    Glucose, Bld 118 (*)    BUN 31 (*)    Creatinine, Ser 1.19 (*)    Albumin 2.3 (*)    AST 79 (*)    ALT 91 (*)    GFR, Estimated 47 (*)    All other components within normal limits  CBC WITH DIFFERENTIAL/PLATELET - Abnormal; Notable for the following components:   WBC 12.4 (*)    RBC 5.22 (*)    MCH 25.3 (*)    RDW 16.1 (*)    Platelets 449 (*)    Neutro Abs 11.2 (*)    All other components within normal limits  URINALYSIS, ROUTINE W REFLEX MICROSCOPIC  BASIC METABOLIC PANEL  BASIC METABOLIC PANEL  BASIC METABOLIC PANEL    EKG None  Radiology CT Head Wo Contrast  Result Date: 08/25/2022 CLINICAL DATA:  Neuro deficit, acute, stroke suspected. EXAM: CT HEAD WITHOUT CONTRAST TECHNIQUE: Contiguous axial images were obtained from the base of the skull through the vertex without intravenous contrast. RADIATION DOSE REDUCTION: This exam was performed according to the departmental dose-optimization program which includes automated exposure control, adjustment of the mA and/or kV according to patient size and/or use of iterative reconstruction technique. COMPARISON:  06/30/2022. FINDINGS: Brain: No acute intracranial hemorrhage, midline shift or mass effect. No extra-axial fluid collection. Diffuse atrophy is present. Extensive subcortical and periventricular white matter hypodensities are present bilaterally. No hydrocephalus. Vascular: No hyperdense vessel or unexpected calcification. Skull: Normal. Negative for fracture or focal lesion. Sinuses/Orbits: No acute finding. Other: None. IMPRESSION: 1. No acute intracranial process. 2. Atrophy with extensive chronic microvascular ischemic changes. Electronically Signed   By: Thornell Sartorius M.D.   On: 08/25/2022 02:14   DG Chest Port 1 View  Result Date: 08/25/2022 CLINICAL DATA:  Failure to thrive  EXAM: PORTABLE CHEST 1 VIEW COMPARISON:  06/30/2022 FINDINGS: The heart size and mediastinal contours are within normal limits. Both lungs are clear. The visualized skeletal structures are unremarkable. IMPRESSION: No active disease. Electronically Signed   By: Helyn Numbers M.D.   On: 08/25/2022 01:53    Procedures Procedures  Chronic monitor shows normal sinus rhythm, per my interpretation.  Medications Ordered in ED Medications  sodium chloride 0.9 % bolus 1,000 mL (has no administration in time range)    ED Course/ Medical Decision Making/ A&P                           Medical Decision Making Amount and/or Complexity of Data Reviewed Labs: ordered. Radiology: ordered.  Risk Decision regarding hospitalization.   Report of decreased appetite and lethargy and patient with severe dementia.  She does appear somewhat dehydrated and I have ordered oral fluids.  I have also ordered urinalysis to look for evidence of occult UTI.  I have ordered screening labs of CBC, comprehensive metabolic panel.  I have also ordered a portable chest x-ray to look for evidence of occult pneumonia.  I have concerns with her gaze preference that she may have had a stroke, I have ordered a CT of the head to look for evidence of this.  I have reviewed her old records, and she was admitted from 06/30/2022 through 07/08/2022 for generalized weakness at which time she was found to have a UTI.  CT of the head shows atrophy but no evidence of recent stroke or other acute process.  Chest x-ray shows no acute process.  I have independently viewed all of these images, and agree with the radiologist's interpretation.  I have reviewed and interpreted her laboratory tests and my interpretation is severe hypernatremia with mild hypokalemia evidence of acute kidney injury, mildly elevated random glucose, mild leukocytosis.  Hemoglobin is 2 g higher than most recent value likely representing hemoconcentration.  Urinalysis is  still pending.  I have ordered additional IV fluids.  I have discussed the case with Dr. Josephine Cables of [facility], who agrees to admit the patient.  CRITICAL CARE Performed by: Delora Fuel Total critical care time: 60 minutes Critical care time was exclusive of separately billable procedures and treating other patients. Critical care was necessary to treat or prevent imminent or life-threatening deterioration. Critical care was time spent personally by me on the following activities: development of treatment plan with patient and/or surrogate as well as nursing, discussions with consultants, evaluation of patient's response to treatment, examination of patient, obtaining history from patient or surrogate, ordering and performing treatments and interventions, ordering and review of laboratory studies, ordering and review of radiographic studies, pulse oximetry and re-evaluation of patient's condition.  Final Clinical Impression(s) / ED Diagnoses Final diagnoses:  Hypernatremia  Acute kidney injury (nontraumatic) (HCC)  Hypokalemia  Elevated random blood glucose level    Rx / DC Orders ED Discharge Orders     None         Delora Fuel, [place] [id number] [phone]

## 2022-08-25 NOTE — Progress Notes (Signed)
Brief Note: -Patient was admitted earlier today. -As per H&P done on admission:"Donna Simmons is a 81 y.o. female with medical history significant of Alzheimer's dementia with memory disturbance, hypertension, hyperlipidemia, permanent atrial fibrillation on Eliquis who presents to the emergency department via EMS from [facility] for failure to thrive.  Patient was reported to have had decreased oral intake over the last 3 days and with no oral intake within the last 24 hours, she was noted to become lethargic today, so EMS was activated and patient was sent to the ED for further evaluation and management.  Of note, patient was unable to provide history, history was obtained from the ED physician and ED medical record.     ED Course:  In the emergency department, patient was intermittently tachycardic, but other vital signs were within normal range.  Workup in the ED showed leukocytosis and thrombocytosis.  BMP showed sodium of 166, potassium 3.4, chloride 122, bicarb 29, glucose 118, BUN/creatinine 31/1.19 (baseline creatinine at 0.6 -  0.8).  Albumin 2.3, AST 79, ALT 91 ALP 87. CT head without contrast showed no acute intracranial process Chest x-ray showed no active disease IV hydration was provided, hospitalist was asked admit patient for further evaluation and management".  08/25/2022: Patient seen.  No significant history from patient.  Patient has history of dementia. -On presentation, sodium was 166. -Last BMP done revealed sodium of 162. -Currently, patient is 3.55 L free water deficit. -Will change IV fluids from normal saline to D5 for tach, and continued rectal since 5 cc/h. -Free water 200 cc every 4 hours. -BMP every 6 hours x 4 occurrences.. -The goal is to have a drop in sodium level by 0.5/h.

## 2022-08-25 NOTE — Progress Notes (Signed)
6812: Patient arrived to the floor from the ED. Upon arrival patient's brief was full of stool. Patient also noted to have a large amounts of pink substance in and around her mouth. Patient suctioned and oral care provided. Patient cleaned up, gown changed, assessment completed. Patient noted to have pressure injures to R foot and L foot.

## 2022-08-25 NOTE — H&P (Addendum)
History and Physical    Patient: Donna Simmons:[id number] DOB: 29-Nov-1940 DOA: 08/25/2022 DOS: the patient was seen and examined on 08/25/2022 PCP: Donna Manges, DO  Patient coming from: SNF  Chief Complaint:  Chief Complaint  Patient presents with   Failure To Thrive   HPI: Donna Simmons is a 81 y.o. female with medical history significant of Alzheimer's dementia with memory disturbance, hypertension, hyperlipidemia, permanent atrial fibrillation on Eliquis who presents to the emergency department via EMS from [facility] for failure to thrive.  Patient was reported to have had decreased oral intake over the last 3 days and with no oral intake within the last 24 hours, she was noted to become lethargic today, so EMS was activated and patient was sent to the ED for further evaluation and management.  Of note, patient was unable to provide history, history was obtained from the ED physician and ED medical record.   ED Course:  In the emergency department, patient was intermittently tachycardic, but other vital signs were within normal range.  Workup in the ED showed leukocytosis and thrombocytosis.  BMP showed sodium of 166, potassium 3.4, chloride 122, bicarb 29, glucose 118, BUN/creatinine 31/1.19 (baseline creatinine at 0.6 -  0.8).  Albumin 2.3, AST 79, ALT 91 ALP 87. CT head without contrast showed no acute intracranial process Chest x-ray showed no active disease IV hydration was provided, hospitalist was asked admit patient for further evaluation and management.  Review of Systems: Review of systems as noted in the HPI. All other systems reviewed and are negative.   Past Medical History:  Diagnosis Date   Allergy    GERD (gastroesophageal reflux disease)    Heart murmur    No significant valvular lesion noted on echo.   Hypercholesteremia    Hypertension    Memory loss    Paroxysmal atrial fibrillation (HCC) 12/27/2008   Past Surgical History:  Procedure  Laterality Date   ABDOMINAL HYSTERECTOMY  1980   BREAST SURGERY     CT CTA CORONARY W/CA SCORE W/CM &/OR WO/CM  08/2018   Coronary calcium score 24.  Nonobstructive CAD with less than 30% plaque in proximal LAD.  Normal aortic root. ->  Relook in October 2020 had significant motion artifact but coronary calcium score 26.   EYE SURGERY Right 12/29/2016   EYE SURGERY Left 01/19/2017   FRACTURE SURGERY     LEFT HEART CATH AND CORONARY ANGIOGRAPHY  12/16/2010   no evidence of CAD to explain anginal pain w/ positive troponin.  potential etiology is breakthrough AF   NM MYOVIEW LTD  April 2010   EF 64%, normal pattern of perfusion in all regions, no scintigraphic evidence of inducible ischemia; no significant wall motion abnormalities; EKG negative for ischemia; no significant change from last study; low risk scan   TRANSTHORACIC ECHOCARDIOGRAM  07/2018   Normal EF 55-60%.  No RWMA. GR 1 DD. Ao Sclerosis - no Stenosis. Mild AI. Trivial MR.     Social History:  reports that she has never smoked. She has never used smokeless tobacco. She reports that she does not drink alcohol and does not use drugs.   Allergies  Allergen Reactions   Antihistamines, Chlorpheniramine-Type Palpitations    Tachycardia     Family History  Problem Relation Age of Onset   Arthritis Mother        OSTEO   Hypertension Mother    Dementia Mother    Heart disease Father    Hypertension Sister  Dementia Sister    Cancer - Lung Brother    Cancer Maternal Grandmother    Heart disease Maternal Grandfather    Cancer - Lung Brother    Hypertension Brother    Cancer - Prostate Brother     Prior to Admission medications   Medication Sig Start Date End Date Taking? Authorizing Provider  acetaminophen (TYLENOL) 325 MG tablet Take 2 tablets (650 mg total) by mouth every 6 (six) hours as needed for mild pain, fever or headache. 07/07/22   Kathlen Mody, MD  apixaban (ELIQUIS) 5 MG TABS tablet Take 1 tablet by mouth  twice daily Patient taking differently: Take 5 mg by mouth 2 (two) times daily. 08/08/21   Marykay Lex, MD  Cholecalciferol (VITAMIN D-3 PO) Take 1 capsule by mouth daily.    [provider]  Ensure (ENSURE) Take 237 mLs by mouth 2 (two) times daily as needed (meal supplement). Vanilla    [provider]  flecainide (TAMBOCOR) 50 MG tablet TAKE 1 TABLET BY MOUTH TWICE DAILY. APPOINTMENT REQUIRED FOR FUTURE REFILLS Patient taking differently: Take 50 mg by mouth 2 (two) times daily. 01/15/22   Marykay Lex, MD  melatonin 5 MG TABS Take 1 tablet (5 mg total) by mouth at bedtime as needed. 07/07/22   Kathlen Mody, MD  metoprolol succinate (TOPROL-XL) 25 MG 24 hr tablet Take 0.5 tablets (12.5 mg total) by mouth daily. 07/08/22   Kathlen Mody, MD  omeprazole (PRILOSEC) 40 MG capsule Take 40 mg by mouth daily.    [provider]  OVER THE COUNTER MEDICATION Take 1 tablet by mouth daily. OTC Potassium supplement..    [provider]  polyethylene glycol (MIRALAX / GLYCOLAX) 17 g packet Take 17 g by mouth daily as needed for mild constipation. 07/07/22   Kathlen Mody, MD  traZODone (DESYREL) 50 MG tablet Take 1 tablet (50 mg total) by mouth at bedtime. 07/07/22   Kathlen Mody, MD    Physical Exam: BP 119/82   Pulse (!) 103   Temp 98.6 F (37 C) (Axillary)   Resp 18   SpO2 98%   General: 81 y.o. year-old female cachectic, ill-appearing, but in no acute distress.   HEENT: NCAT, EOMI, dry mucous membrane, left gaze preference Neck: Supple, trachea medial Cardiovascular: Regular rate and rhythm with no rubs or gallops.  No thyromegaly or JVD noted.  No lower extremity edema. 2/4 pulses in all 4 extremities. Respiratory: Clear to auscultation with no wheezes or rales. Good inspiratory effort. Abdomen: Soft, nontender nondistended with normal bowel sounds x4 quadrants. Muskuloskeletal: No cyanosis, clubbing or edema noted bilaterally Neuro: Awake, nonverbal  and was not responding to commands.  CN II-XII intact,  sensation, reflexes intact Skin: No ulcerative lesions noted or rashes Psychiatry: Judgement and insight appear normal. Mood is appropriate for condition and setting          Labs on Admission:  Basic Metabolic Panel: Recent Labs  Lab 08/25/22 0231  NA 166*  K 3.4*  CL 122*  CO2 29  GLUCOSE 118*  BUN 31*  CREATININE 1.19*  CALCIUM 9.2   Liver Function Tests: Recent Labs  Lab 08/25/22 0231  AST 79*  ALT 91*  ALKPHOS 87  BILITOT 1.1  PROT 7.2  ALBUMIN 2.3*   No results for input(s): "LIPASE", "AMYLASE" in the last 168 hours. No results for input(s): "AMMONIA" in the last 168 hours. CBC: Recent Labs  Lab 08/25/22 0231  WBC 12.4*  NEUTROABS 11.2*  HGB 13.2  HCT 44.0  MCV 84.3  PLT 449*   Cardiac Enzymes: No results for input(s): "CKTOTAL", "CKMB", "CKMBINDEX", "TROPONINI" in the last 168 hours.  BNP (last 3 results) No results for input(s): "BNP" in the last 8760 hours.  ProBNP (last 3 results) No results for input(s): "PROBNP" in the last 8760 hours.  CBG: No results for input(s): "GLUCAP" in the last 168 hours.  Radiological Exams on Admission: CT Head Wo Contrast  Result Date: 08/25/2022 CLINICAL DATA:  Neuro deficit, acute, stroke suspected. EXAM: CT HEAD WITHOUT CONTRAST TECHNIQUE: Contiguous axial images were obtained from the base of the skull through the vertex without intravenous contrast. RADIATION DOSE REDUCTION: This exam was performed according to the departmental dose-optimization program which includes automated exposure control, adjustment of the mA and/or kV according to patient size and/or use of iterative reconstruction technique. COMPARISON:  06/30/2022. FINDINGS: Brain: No acute intracranial hemorrhage, midline shift or mass effect. No extra-axial fluid collection. Diffuse atrophy is present. Extensive subcortical and periventricular white matter hypodensities are present bilaterally.  No hydrocephalus. Vascular: No hyperdense vessel or unexpected calcification. Skull: Normal. Negative for fracture or focal lesion. Sinuses/Orbits: No acute finding. Other: None. IMPRESSION: 1. No acute intracranial process. 2. Atrophy with extensive chronic microvascular ischemic changes. Electronically Signed   By: Thornell SartoriusLaura  Taylor M.D.   On: 08/25/2022 02:14   DG Chest Port 1 View  Result Date: 08/25/2022 CLINICAL DATA:  Failure to thrive EXAM: PORTABLE CHEST 1 VIEW COMPARISON:  06/30/2022 FINDINGS: The heart size and mediastinal contours are within normal limits. Both lungs are clear. The visualized skeletal structures are unremarkable. IMPRESSION: No active disease. Electronically Signed   By: Helyn NumbersAshesh  Parikh M.D.   On: 08/25/2022 01:53    EKG: I independently viewed the EKG done and my findings are as followed: EKG was not checked in the ED  Assessment/Plan Present on Admission:  Hypernatremia  GERD (gastroesophageal reflux disease)  Principal Problem:   Hypernatremia Active Problems:   GERD (gastroesophageal reflux disease)   Leukocytosis   Thrombocytosis   Hypokalemia   Hypoalbuminemia due to protein-calorie malnutrition (HCC)   Transaminitis   AKI (acute kidney injury) (HCC)   Permanent atrial fibrillation (HCC)   Iron deficiency anemia   Alzheimer's dementia (HCC)   Insomnia  Hypernatremia Na 166, patient has about 4.2 L free water deficit IV hydration was started, we shall continue with same at this time and eventually transition to more hypotonic fluid. Continue to monitor serial BMPs  Leukocytosis possibly reactive This is possibly secondary to hemoconcentration Continue IV hydration  Thrombocytosis possibly reactive This is possibly secondary to hemoconcentration Continue IV hydration  Hypokalemia K+ 3.4, this will be replenished  Acute kidney injury Dehydration BUN/creatinine 31/1.19 (baseline creatinine at 0.6 -  0.8) Continue gentle hydration Renally  adjust medications, avoid nephrotoxic agents/dehydration/hypotension  Transaminitis possibly reactive AST 79, ALT 91, patient denies any abdominal pain Continue IV hydration and continue to monitor liver enzymes  Hypoalbuminemia possibly secondary to moderate protein calorie malnutrition Albumin 2.3, protein supplement will be provided  Iron deficiency anemia Continue ferrous sulfate  Permanent atrial fibrillation Continue flecainide and Eliquis  GERD Continue Protonix  Insomnia Continue melatonin nightly as needed  Alzheimer's dementia Stable   DVT prophylaxis: Eliquis  Code Status: Full code  Family Communication: None at bedside  Consults: None  Severity of Illness: The appropriate patient status for this patient is OBSERVATION. Observation status is judged to be reasonable and necessary in order to provide the required  intensity of service to ensure the patient's safety. The patient's presenting symptoms, physical exam findings, and initial radiographic and laboratory data in the context of their medical condition is felt to place them at decreased risk for further clinical deterioration. Furthermore, it is anticipated that the patient will be medically stable for discharge from the hospital within 2 midnights of admission.   Author: Frankey Shown, DO 08/25/2022 5:54 AM  For on call review [email or web address].

## 2022-08-25 NOTE — ED Triage Notes (Signed)
Pt arrived via [facility] from [facility] for failure to thrive.  History of dementia and frequent UTIs.  Family reported decreased PO intake x 2-3 days with no PO intake in the last 24 hours.  Reports increased lethargy today.  Pt is a FULL CODE.  CBG 129 PTA.  Pt received an enema today with a "moderate amount" of stool.

## 2022-08-26 DIAGNOSIS — Z66 Do not resuscitate: Secondary | ICD-10-CM | POA: Diagnosis present

## 2022-08-26 DIAGNOSIS — E8809 Other disorders of plasma-protein metabolism, not elsewhere classified: Secondary | ICD-10-CM | POA: Diagnosis present

## 2022-08-26 DIAGNOSIS — Z6832 Body mass index (BMI) 32.0-32.9, adult: Secondary | ICD-10-CM | POA: Diagnosis not present

## 2022-08-26 DIAGNOSIS — E876 Hypokalemia: Secondary | ICD-10-CM | POA: Diagnosis not present

## 2022-08-26 DIAGNOSIS — R64 Cachexia: Secondary | ICD-10-CM | POA: Diagnosis present

## 2022-08-26 DIAGNOSIS — R6251 Failure to thrive (child): Secondary | ICD-10-CM | POA: Diagnosis not present

## 2022-08-26 DIAGNOSIS — L89154 Pressure ulcer of sacral region, stage 4: Secondary | ICD-10-CM | POA: Diagnosis not present

## 2022-08-26 DIAGNOSIS — S9030XA Contusion of unspecified foot, initial encounter: Secondary | ICD-10-CM | POA: Diagnosis present

## 2022-08-26 DIAGNOSIS — L89623 Pressure ulcer of left heel, stage 3: Secondary | ICD-10-CM | POA: Diagnosis not present

## 2022-08-26 DIAGNOSIS — E860 Dehydration: Secondary | ICD-10-CM | POA: Diagnosis present

## 2022-08-26 DIAGNOSIS — Z681 Body mass index (BMI) 19 or less, adult: Secondary | ICD-10-CM | POA: Diagnosis not present

## 2022-08-26 DIAGNOSIS — N390 Urinary tract infection, site not specified: Secondary | ICD-10-CM | POA: Diagnosis not present

## 2022-08-26 DIAGNOSIS — Z7189 Other specified counseling: Secondary | ICD-10-CM | POA: Diagnosis not present

## 2022-08-26 DIAGNOSIS — R9431 Abnormal electrocardiogram [ECG] [EKG]: Secondary | ICD-10-CM | POA: Diagnosis not present

## 2022-08-26 DIAGNOSIS — N179 Acute kidney failure, unspecified: Secondary | ICD-10-CM | POA: Diagnosis not present

## 2022-08-26 DIAGNOSIS — I129 Hypertensive chronic kidney disease with stage 1 through stage 4 chronic kidney disease, or unspecified chronic kidney disease: Secondary | ICD-10-CM | POA: Diagnosis present

## 2022-08-26 DIAGNOSIS — G309 Alzheimer's disease, unspecified: Secondary | ICD-10-CM | POA: Diagnosis not present

## 2022-08-26 DIAGNOSIS — R627 Adult failure to thrive: Secondary | ICD-10-CM | POA: Diagnosis present

## 2022-08-26 DIAGNOSIS — L89893 Pressure ulcer of other site, stage 3: Secondary | ICD-10-CM | POA: Diagnosis not present

## 2022-08-26 DIAGNOSIS — E870 Hyperosmolality and hypernatremia: Secondary | ICD-10-CM | POA: Diagnosis not present

## 2022-08-26 DIAGNOSIS — D75839 Thrombocytosis, unspecified: Secondary | ICD-10-CM | POA: Diagnosis present

## 2022-08-26 DIAGNOSIS — R7309 Other abnormal glucose: Secondary | ICD-10-CM | POA: Diagnosis not present

## 2022-08-26 DIAGNOSIS — L89216 Pressure-induced deep tissue damage of right hip: Secondary | ICD-10-CM | POA: Diagnosis not present

## 2022-08-26 DIAGNOSIS — F03C0 Unspecified dementia, severe, without behavioral disturbance, psychotic disturbance, mood disturbance, and anxiety: Secondary | ICD-10-CM | POA: Diagnosis not present

## 2022-08-26 DIAGNOSIS — W19XXXD Unspecified fall, subsequent encounter: Secondary | ICD-10-CM | POA: Diagnosis present

## 2022-08-26 DIAGNOSIS — E43 Unspecified severe protein-calorie malnutrition: Secondary | ICD-10-CM | POA: Diagnosis present

## 2022-08-26 DIAGNOSIS — Z515 Encounter for palliative care: Secondary | ICD-10-CM | POA: Diagnosis not present

## 2022-08-26 DIAGNOSIS — F02818 Dementia in other diseases classified elsewhere, unspecified severity, with other behavioral disturbance: Secondary | ICD-10-CM | POA: Diagnosis present

## 2022-08-26 DIAGNOSIS — K219 Gastro-esophageal reflux disease without esophagitis: Secondary | ICD-10-CM | POA: Diagnosis not present

## 2022-08-26 DIAGNOSIS — F02C0 Dementia in other diseases classified elsewhere, severe, without behavioral disturbance, psychotic disturbance, mood disturbance, and anxiety: Secondary | ICD-10-CM | POA: Diagnosis not present

## 2022-08-26 DIAGNOSIS — Z1612 Extended spectrum beta lactamase (ESBL) resistance: Secondary | ICD-10-CM | POA: Diagnosis not present

## 2022-08-26 DIAGNOSIS — D509 Iron deficiency anemia, unspecified: Secondary | ICD-10-CM | POA: Diagnosis present

## 2022-08-26 DIAGNOSIS — R4182 Altered mental status, unspecified: Secondary | ICD-10-CM | POA: Diagnosis not present

## 2022-08-26 DIAGNOSIS — I4821 Permanent atrial fibrillation: Secondary | ICD-10-CM | POA: Diagnosis present

## 2022-08-26 LAB — CBC
HCT: 37.8 % (ref 36.0–46.0)
Hemoglobin: 11.1 g/dL — ABNORMAL LOW (ref 12.0–15.0)
MCH: 24.8 pg — ABNORMAL LOW (ref 26.0–34.0)
MCHC: 29.4 g/dL — ABNORMAL LOW (ref 30.0–36.0)
MCV: 84.6 fL (ref 80.0–100.0)
Platelets: 393 10*3/uL (ref 150–400)
RBC: 4.47 MIL/uL (ref 3.87–5.11)
RDW: 15.9 % — ABNORMAL HIGH (ref 11.5–15.5)
WBC: 10.6 10*3/uL — ABNORMAL HIGH (ref 4.0–10.5)
nRBC: 0 % (ref 0.0–0.2)

## 2022-08-26 LAB — PHOSPHORUS: Phosphorus: 2.7 mg/dL (ref 2.5–4.6)

## 2022-08-26 LAB — BASIC METABOLIC PANEL
Anion gap: 9 (ref 5–15)
Anion gap: 10 (ref 5–15)
BUN: 25 mg/dL — ABNORMAL HIGH (ref 8–23)
BUN: 19 mg/dL (ref 8–23)
CO2: 26 mmol/L (ref 22–32)
CO2: 28 mmol/L (ref 22–32)
Calcium: 8.4 mg/dL — ABNORMAL LOW (ref 8.9–10.3)
Calcium: 8.5 mg/dL — ABNORMAL LOW (ref 8.9–10.3)
Chloride: 123 mmol/L — ABNORMAL HIGH (ref 98–111)
Chloride: 118 mmol/L — ABNORMAL HIGH (ref 98–111)
Creatinine, Ser: 0.71 mg/dL (ref 0.44–1.00)
Creatinine, Ser: 0.7 mg/dL (ref 0.44–1.00)
GFR, Estimated: >60 mL/min (ref >60)
GFR, Estimated: >60 mL/min (ref >60)
Glucose, Bld: 116 mg/dL — ABNORMAL HIGH (ref 70–99)
Glucose, Bld: 103 mg/dL — ABNORMAL HIGH (ref 70–99)
Potassium: 2.9 mmol/L — ABNORMAL LOW (ref 3.5–5.1)
Potassium: 3.2 mmol/L — ABNORMAL LOW (ref 3.5–5.1)
Sodium: 158 mmol/L — ABNORMAL HIGH (ref 135–145)
Sodium: 156 mmol/L — ABNORMAL HIGH (ref 135–145)

## 2022-08-26 LAB — COMPREHENSIVE METABOLIC PANEL
ALT: 74 U/L — ABNORMAL HIGH (ref 0–44)
AST: 53 U/L — ABNORMAL HIGH (ref 15–41)
Albumin: 1.9 g/dL — ABNORMAL LOW (ref 3.5–5.0)
Alkaline Phosphatase: 75 U/L (ref 38–126)
Anion gap: 6 (ref 5–15)
BUN: 24 mg/dL — ABNORMAL HIGH (ref 8–23)
CO2: 31 mmol/L (ref 22–32)
Calcium: 8.5 mg/dL — ABNORMAL LOW (ref 8.9–10.3)
Chloride: 120 mmol/L — ABNORMAL HIGH (ref 98–111)
Creatinine, Ser: 0.83 mg/dL (ref 0.44–1.00)
GFR, Estimated: >60 mL/min (ref >60)
Glucose, Bld: 130 mg/dL — ABNORMAL HIGH (ref 70–99)
Potassium: 2.6 mmol/L — CL (ref 3.5–5.1)
Sodium: 157 mmol/L — ABNORMAL HIGH (ref 135–145)
Total Bilirubin: 0.7 mg/dL (ref 0.3–1.2)
Total Protein: 6.4 g/dL — ABNORMAL LOW (ref 6.5–8.1)

## 2022-08-26 LAB — MAGNESIUM: Magnesium: 2.6 mg/dL — ABNORMAL HIGH (ref 1.7–2.4)

## 2022-08-26 MED ORDER — POTASSIUM CHLORIDE 20 MEQ PO PACK: 60.0000 meq | PACK | Freq: Once | ORAL | Status: DC

## 2022-08-26 MED ORDER — FREE WATER: 200.0000 mL | Freq: Four times a day (QID) | Status: DC

## 2022-08-26 MED ORDER — ALBUMIN HUMAN 25 % IV SOLN
25.0000 g | Freq: Every day | INTRAVENOUS | Status: AC
  Administered 2022-08-26 – 2022-08-29 (×4): 25 g via INTRAVENOUS
  Filled 2022-08-26 (×4): qty 100

## 2022-08-26 MED ORDER — FREE WATER
200.0000 mL | Status: DC
  Administered 2022-08-26 – 2022-08-28 (×11): 200 mL via ORAL
  Administered 2022-08-29: 15 mL via ORAL
  Administered 2022-08-29 (×3): 200 mL via ORAL
  Administered 2022-08-30: 15 mL via ORAL
  Administered 2022-08-30: 100 mL via ORAL
  Administered 2022-08-30: 200 mL via ORAL
  Administered 2022-08-30: 60 mL via ORAL
  Administered 2022-08-31 – 2022-09-01 (×9): 200 mL via ORAL
  Administered 2022-09-01: 50 mL via ORAL
  Administered 2022-09-02 (×2): 200 mL via ORAL

## 2022-08-26 MED ORDER — POTASSIUM CHLORIDE 10 MEQ/100ML IV SOLN
10.0000 meq | INTRAVENOUS | Status: AC
  Administered 2022-08-26 (×4): 10 meq via INTRAVENOUS
  Filled 2022-08-26 (×4): qty 100

## 2022-08-26 MED ORDER — FREE WATER: 200.0000 mL | Status: DC

## 2022-08-26 MED ORDER — POTASSIUM CHLORIDE 10 MEQ/100ML IV SOLN
10.0000 meq | INTRAVENOUS | Status: AC
  Administered 2022-08-26 (×2): 10 meq via INTRAVENOUS
  Filled 2022-08-26 (×2): qty 100

## 2022-08-26 NOTE — Progress Notes (Signed)
MD notified of critical NA 163

## 2022-08-26 NOTE — Progress Notes (Signed)
Provider Ogbata made aware of patient's critical lab value-potassium is 2.6. Provider made aware that patient has oral potassium ordered but patient is taking very little water and food by mouth, so she will likely not be able to consume medication PO. Patient resting comfortably in bed at this time with bed lowered and call bell within reach.

## 2022-08-26 NOTE — Progress Notes (Signed)
PROGRESS NOTE    Donna Simmons  NID:[id number] DOB: 12/22/1940 DOA: 08/25/2022 PCP: Yolanda Manges, DO  Outpatient Specialists:     Brief Narrative:  Patient is a 81 year old female with medical history significant for Alzheimer's dementia, hypertension, hyperlipidemia, permanent atrial fibrillation on Eliquis.  Patient was admitted to await failure to thrive, hyponatremia (sodium of 166), and poor p.o. intake.  Patient is currently on IV fluids D5 water at 75 cc/h.  Patient will also be on free water 200 cc every 4 hours.  Lab work done this morning, 08/26/2022 revealed sodium of 157, potassium of 3.6, magnesium of 3.6, albumin of 1.9, AST of 53, ALT of 74 and total protein of 6.4.  Will correct abnormal potassium.  Will continue to monitor renal function and electrolytes.  Will check urinalysis, looking for protein, consider drain globulin of 4.4 and albumin of 1.9.  08/26/2022: Lab work done today revealed sodium of 157, potassium of 2.6, chloride of 120, CO2 of 31, BUN of 24 with serum creatinine of 0.83.  Albumin is 1.9.  Free water.  Repeat potassium.  Magnesium is 2.6.  No significant history from patient.  Repeat renal panel every 6 hours.   Assessment & Plan:   Principal Problem:   Hypernatremia Active Problems:   GERD (gastroesophageal reflux disease)   Leukocytosis   Thrombocytosis   Hypokalemia   Hypoalbuminemia due to protein-calorie malnutrition (HCC)   Transaminitis   AKI (acute kidney injury) (HCC)   Permanent atrial fibrillation (HCC)   Iron deficiency anemia   Alzheimer's dementia (HCC)   Insomnia   Hypernatremia: -Likely volume dependent. -On presentation, sodium was 166. -Sodium has improved to 157 today. -Continue D5 water. -Oral free water. -Monitor renal function and electrolytes every 6 hours. -Gentle drop in sodium desired.   Leukocytosis possibly reactive -WBC is 10.6 today. -Smelly urine negative. -Urinalysis and urine culture to rule out  UTI.     Thrombocytosis possibly reactive -Resolved. -Platelet is 393 today.     Hypokalemia -Potassium is down to 2.6 today. -Replace potassium aggressively.    Acute kidney injury: -Resolved.  Dehydration: -Hydrate patient.   Transaminitis: -Etiology unclear. -AST is 52 today ALT 74. -Total protein 6.4 with albumin of 1.9. -Continue to monitor for now.     Hypoalbuminemia possibly secondary to moderate protein calorie malnutrition Albumin 2.3, protein supplement will be provided   Iron deficiency anemia Continue ferrous sulfate   Permanent atrial fibrillation Continue flecainide and Eliquis   GERD Continue Protonix   Insomnia Continue melatonin nightly as needed   Alzheimer's dementia Stable     DVT prophylaxis: Eliquis Code Status: Full code Family Communication:  Disposition Plan: This will depend on hospital course   Consultants:  None.  Will have a low threshold to consult the palliative care team.  Procedures:  None  Antimicrobials:  None   Subjective: No significant history from patient.  Objective: Vitals:   08/25/22 2011 08/26/22 0450 08/26/22 0715 08/26/22 0750  BP: 101/86 133/80 108/63   Pulse: 97 (!) 101 (!) 101   Resp: 16 16 18    Temp: 97.9 F (36.6 C) 98 F (36.7 C) (!) 97.5 F (36.4 C)   TempSrc:  Oral Oral   SpO2:  98% (!) 88% 92%    Intake/Output Summary (Last 24 hours) at 08/26/2022 0856 Last data filed at 08/26/2022 0631 Gross per 24 hour  Intake 1435.4 ml  Output --  Net 1435.4 ml   There were no vitals filed for this  visit.  Examination:  General exam: Patient is cachectic.  Patient is awake.  No communication with patient.    Respiratory system: Clear to auscultation.  Cardiovascular system: S1 & S2 heard Gastrointestinal system: Abdomen is soft and nontender.  Central nervous system: Awake.  Will not comply with examination.   Extremities: No leg edema.  Data Reviewed: I have personally reviewed  following labs and imaging studies  CBC: Recent Labs  Lab 08/25/22 0231 08/26/22 0659  WBC 12.4* 10.6*  NEUTROABS 11.2*  --   HGB 13.2 11.1*  HCT 44.0 37.8  MCV 84.3 84.6  PLT 449* 393   Basic Metabolic Panel: Recent Labs  Lab 08/25/22 0909 08/25/22 1559 08/25/22 2104 08/26/22 0301 08/26/22 0659  NA 162* 167* 163* 158* 157*  K 3.6 3.1* 2.9* 2.9* 2.6*  CL 128* >130* 128* 123* 120*  CO2 28 28 29 26 31   GLUCOSE 116* 112* 127* 116* 130*  BUN 30* 28* 27* 25* 24*  CREATININE 0.89 0.75 0.78 0.71 0.83  CALCIUM 8.3* 8.3* 8.5* 8.4* 8.5*  MG  --   --   --   --  2.6*  PHOS  --   --   --   --  2.7   GFR: CrCl cannot be calculated (Unknown ideal weight.). Liver Function Tests: Recent Labs  Lab 08/25/22 0231 08/26/22 0659  AST 79* 53*  ALT 91* 74*  ALKPHOS 87 75  BILITOT 1.1 0.7  PROT 7.2 6.4*  ALBUMIN 2.3* 1.9*   No results for input(s): "LIPASE", "AMYLASE" in the last 168 hours. No results for input(s): "AMMONIA" in the last 168 hours. Coagulation Profile: No results for input(s): "INR", "PROTIME" in the last 168 hours. Cardiac Enzymes: No results for input(s): "CKTOTAL", "CKMB", "CKMBINDEX", "TROPONINI" in the last 168 hours. BNP (last 3 results) No results for input(s): "PROBNP" in the last 8760 hours. HbA1C: No results for input(s): "HGBA1C" in the last 72 hours. CBG: No results for input(s): "GLUCAP" in the last 168 hours. Lipid Profile: No results for input(s): "CHOL", "HDL", "LDLCALC", "TRIG", "CHOLHDL", "LDLDIRECT" in the last 72 hours. Thyroid Function Tests: No results for input(s): "TSH", "T4TOTAL", "FREET4", "T3FREE", "THYROIDAB" in the last 72 hours. Anemia Panel: No results for input(s): "VITAMINB12", "FOLATE", "FERRITIN", "TIBC", "IRON", "RETICCTPCT" in the last 72 hours. Urine analysis:    Component Value Date/Time   COLORURINE YELLOW 06/30/2022 2151   APPEARANCEUR HAZY (A) 06/30/2022 2151   LABSPEC 1.025 06/30/2022 2151   PHURINE 5.0  06/30/2022 2151   GLUCOSEU NEGATIVE 06/30/2022 2151   HGBUR LARGE (A) 06/30/2022 2151   BILIRUBINUR NEGATIVE 06/30/2022 2151   BILIRUBINUR negative 06/03/2016 1542   BILIRUBINUR neg 07/21/2014 1622   KETONESUR 20 (A) 06/30/2022 2151   PROTEINUR 30 (A) 06/30/2022 2151   UROBILINOGEN 0.2 06/03/2016 1542   UROBILINOGEN 0.2 12/20/2015 1535   NITRITE POSITIVE (A) 06/30/2022 2151   LEUKOCYTESUR SMALL (A) 06/30/2022 2151   Sepsis Labs: @LABRCNTIP (procalcitonin:4,lacticidven:4)  )No results found for this or any previous visit (from the past 240 hour(s)).       Radiology Studies: CT Head Wo Contrast  Result Date: 08/25/2022 CLINICAL DATA:  Neuro deficit, acute, stroke suspected. EXAM: CT HEAD WITHOUT CONTRAST TECHNIQUE: Contiguous axial images were obtained from the base of the skull through the vertex without intravenous contrast. RADIATION DOSE REDUCTION: This exam was performed according to the departmental dose-optimization program which includes automated exposure control, adjustment of the mA and/or kV according to patient size and/or use of iterative reconstruction technique.  COMPARISON:  06/30/2022. FINDINGS: Brain: No acute intracranial hemorrhage, midline shift or mass effect. No extra-axial fluid collection. Diffuse atrophy is present. Extensive subcortical and periventricular white matter hypodensities are present bilaterally. No hydrocephalus. Vascular: No hyperdense vessel or unexpected calcification. Skull: Normal. Negative for fracture or focal lesion. Sinuses/Orbits: No acute finding. Other: None. IMPRESSION: 1. No acute intracranial process. 2. Atrophy with extensive chronic microvascular ischemic changes. Electronically Signed   By: Thornell Sartorius M.D.   On: 08/25/2022 02:14   DG Chest Port 1 View  Result Date: 08/25/2022 CLINICAL DATA:  Failure to thrive EXAM: PORTABLE CHEST 1 VIEW COMPARISON:  06/30/2022 FINDINGS: The heart size and mediastinal contours are within normal  limits. Both lungs are clear. The visualized skeletal structures are unremarkable. IMPRESSION: No active disease. Electronically Signed   By: Helyn Numbers M.D.   On: 08/25/2022 01:53        Scheduled Meds:  apixaban  5 mg Oral BID   feeding supplement  237 mL Oral BID BM   ferrous sulfate  325 mg Oral QODAY   flecainide  50 mg Oral BID   free water  200 mL Oral Q4H   pantoprazole  40 mg Oral Daily   potassium chloride  60 mEq Oral Once   Continuous Infusions:  albumin human     dextrose 75 mL/hr at 08/26/22 0630   potassium chloride       LOS: 0 days    Time spent: 55 minutes.    Berton Mount, MD  [facility] Pager #: [phone] 7PM-7AM contact night coverage as above

## 2022-08-26 NOTE — Progress Notes (Signed)
Report given via phone to [employer]. Receiving nurse voices no questions or concerns at this time.

## 2022-08-26 NOTE — Progress Notes (Signed)
CSW spoke with Georgeann Oppenheim at [facility] who confirms the patient is from the facility for short term rehab. Georgeann Oppenheim states the patient is eligible to return at discharge if desired. A new insurance authorization will be required.  Edwin Dada, MSW, LCSW Transitions of Care  Clinical Social Worker II [phone]

## 2022-08-26 NOTE — Progress Notes (Signed)
Sodium 163 on labs done at 9 PM and down to 158 on labs done at 3 AM.  Per daytime note, patient is supposed to be on free water per tube (200 mL every 4 hours) but I was informed by RN that the patient does not have an NG tube and has only been getting D5W at 100 cc/h.  I have decreased the rate to D5W at 75 cc/h to avoid rapid correction.  Ordered repeat BMP 6 hours from the last draw (9 AM), adjust rate of fluids accordingly.  Also potassium low and oral replacement ordered.

## 2022-08-27 DIAGNOSIS — E870 Hyperosmolality and hypernatremia: Secondary | ICD-10-CM | POA: Diagnosis not present

## 2022-08-27 LAB — RENAL FUNCTION PANEL
Albumin: 2.3 g/dL — ABNORMAL LOW (ref 3.5–5.0)
Albumin: 2.7 g/dL — ABNORMAL LOW (ref 3.5–5.0)
Anion gap: 6 (ref 5–15)
Anion gap: 9 (ref 5–15)
BUN: 12 mg/dL (ref 8–23)
BUN: 10 mg/dL (ref 8–23)
CO2: 26 mmol/L (ref 22–32)
CO2: 24 mmol/L (ref 22–32)
Calcium: 8.2 mg/dL — ABNORMAL LOW (ref 8.9–10.3)
Calcium: 8.4 mg/dL — ABNORMAL LOW (ref 8.9–10.3)
Chloride: 114 mmol/L — ABNORMAL HIGH (ref 98–111)
Chloride: 114 mmol/L — ABNORMAL HIGH (ref 98–111)
Creatinine, Ser: 0.61 mg/dL (ref 0.44–1.00)
Creatinine, Ser: 0.5 mg/dL (ref 0.44–1.00)
GFR, Estimated: >60 mL/min (ref >60)
GFR, Estimated: >60 mL/min (ref >60)
Glucose, Bld: 113 mg/dL — ABNORMAL HIGH (ref 70–99)
Glucose, Bld: 96 mg/dL (ref 70–99)
Phosphorus: 2.6 mg/dL (ref 2.5–4.6)
Phosphorus: 2.6 mg/dL (ref 2.5–4.6)
Potassium: 3.3 mmol/L — ABNORMAL LOW (ref 3.5–5.1)
Potassium: 3.8 mmol/L (ref 3.5–5.1)
Sodium: 146 mmol/L — ABNORMAL HIGH (ref 135–145)
Sodium: 147 mmol/L — ABNORMAL HIGH (ref 135–145)

## 2022-08-27 LAB — MAGNESIUM: Magnesium: 2.3 mg/dL (ref 1.7–2.4)

## 2022-08-27 MED ORDER — POTASSIUM CHLORIDE CRYS ER 20 MEQ PO TBCR
40.0000 meq | EXTENDED_RELEASE_TABLET | Freq: Once | ORAL | Status: AC
  Administered 2022-08-27: 40 meq via ORAL
  Filled 2022-08-27: qty 2

## 2022-08-27 MED ORDER — POTASSIUM CHLORIDE 20 MEQ PO PACK: 40.0000 meq | PACK | Freq: Once | ORAL | Status: DC

## 2022-08-27 MED ORDER — DEXTROSE-NACL 5-0.9 % IV SOLN: INTRAVENOUS | Status: DC

## 2022-08-27 MED ORDER — KCL IN DEXTROSE-NACL 20-5-0.9 MEQ/L-%-% IV SOLN
INTRAVENOUS | Status: DC
  Filled 2022-08-27 (×8): qty 1000

## 2022-08-27 NOTE — Progress Notes (Signed)
PROGRESS NOTE    Donna Simmons  UUV:[id number] DOB: Feb 06, 1941 DOA: 08/25/2022 PCP: Yolanda Manges, DO  Outpatient Specialists:     Brief Narrative:  Patient is a 81 year old female with medical history significant for Alzheimer's dementia, hypertension, hyperlipidemia, permanent atrial fibrillation on Eliquis.  Patient was admitted to await failure to thrive, hyponatremia (sodium of 166), and poor p.o. intake.  Patient is currently on IV fluids D5 water at 75 cc/h.  Patient will also be on free water 200 cc every 4 hours.  Lab work done this morning, 08/26/2022 revealed sodium of 157, potassium of 3.6, magnesium of 3.6, albumin of 1.9, AST of 53, ALT of 74 and total protein of 6.4.  Will correct abnormal potassium.  Will continue to monitor renal function and electrolytes.  Will check urinalysis, looking for protein, consider drain globulin of 4.4 and albumin of 1.9.  08/26/2022: Lab work done today revealed sodium of 157, potassium of 2.6, chloride of 120, CO2 of 31, BUN of 24 with serum creatinine of 0.83.  Albumin is 1.9.  Free water.  Repeat potassium.  Magnesium is 2.6.  No significant history from patient.  Repeat renal panel every 6 hours.  08/27/2022: Patient seen.  No significant history from patient.  Discussed with patient's husband extensively.  Patient's husband has elected for patient to be DO NOT RESUSCITATE.  The husband also informed me that patient has been nonverbal for a while.  Electrolytes are improving.  BMP done today revealed sodium of 146, potassium of 3.3, chloride 114, CO2 26, BUN of 12 and serum creatinine of 0.61.  Will continue to monitor renal function and repeat weekly electrolytes.   Assessment & Plan:   Principal Problem:   Hypernatremia Active Problems:   GERD (gastroesophageal reflux disease)   Leukocytosis   Thrombocytosis   Hypokalemia   Hypoalbuminemia due to protein-calorie malnutrition (HCC)   Transaminitis   AKI (acute kidney injury) (HCC)    Permanent atrial fibrillation (HCC)   Iron deficiency anemia   Alzheimer's dementia (HCC)   Insomnia   Failure to thrive (child)   Hypernatremia: -Likely volume dependent. -On presentation, sodium was 166. -Sodium has improved to 157 today. -Continue D5 water. -Oral free water. -Monitor renal function and electrolytes every 6 hours. -Gentle drop in sodium desired. 08/27/2022: Patient seen.  See above documentation.  Sodium is down to 146.   Leukocytosis possibly reactive -WBC is 10.6 today. -Smelly urine negative. -Urinalysis and urine culture to rule out UTI.   08/27/2022: Urinalysis and urine culture are still pending.   Thrombocytosis possibly reactive -Resolved.     Hypokalemia -Potassium is down to 2.6 today. -Replace potassium aggressively.   08/27/2022: Improving.  Potassium is 3.3 today.  Acute kidney injury: -Resolved.  Dehydration: -Hydrate patient.   Transaminitis: -Etiology unclear. -AST is 52 today ALT 74. -Total protein 6.4 with albumin of 1.9. -Continue to monitor for now.     Hypoalbuminemia possibly secondary to moderate protein calorie malnutrition Albumin 2.3, protein supplement will be provided   Iron deficiency anemia Continue ferrous sulfate   Permanent atrial fibrillation Continue flecainide and Eliquis   GERD Continue Protonix   Insomnia Continue melatonin nightly as needed   Alzheimer's dementia Stable     DVT prophylaxis: Eliquis Code Status: DO NOT RESUSCITATE.   Family Communication:  Disposition Plan: This will depend on hospital course   Consultants:  None.  Will have a low threshold to consult the palliative care team.  Procedures:  None  Antimicrobials:  None   Subjective: No significant history from patient.  Objective: Vitals:   08/27/22 0030 08/27/22 0542 08/27/22 0733 08/27/22 1243  BP: 119/84 112/71 (!) 101/59 116/65  Pulse: 95 84 85 86  Resp: 15 15 16 16   Temp: 97.7 F (36.5 C) (!) 97.5 F  (36.4 C) (!) 97.5 F (36.4 C) 98.5 F (36.9 C)  TempSrc: Oral Axillary Oral Oral  SpO2: 93% 90% (!) 78% (!) 72%    Intake/Output Summary (Last 24 hours) at 08/27/2022 1342 Last data filed at 08/27/2022 1300 Gross per 24 hour  Intake 80 ml  Output --  Net 80 ml    There were no vitals filed for this visit.  Examination:  General exam: Patient is cachectic.  Patient is awake.  No communication with patient.    Respiratory system: Clear to auscultation.  Cardiovascular system: S1 & S2 heard Gastrointestinal system: Abdomen is soft and nontender.  Central nervous system: Awake.  Will not comply with examination.   Extremities: No leg edema.  Data Reviewed: I have personally reviewed following labs and imaging studies  CBC: Recent Labs  Lab 08/25/22 0231 08/26/22 0659  WBC 12.4* 10.6*  NEUTROABS 11.2*  --   HGB 13.2 11.1*  HCT 44.0 37.8  MCV 84.3 84.6  PLT 449* 393    Basic Metabolic Panel: Recent Labs  Lab 08/25/22 2104 08/26/22 0301 08/26/22 0659 08/26/22 1631 08/27/22 1149  NA 163* 158* 157* 156* 146*  K 2.9* 2.9* 2.6* 3.2* 3.3*  CL 128* 123* 120* 118* 114*  CO2 29 26 31 28 26   GLUCOSE 127* 116* 130* 103* 113*  BUN 27* 25* 24* 19 12  CREATININE 0.78 0.71 0.83 0.70 0.61  CALCIUM 8.5* 8.4* 8.5* 8.5* 8.2*  MG  --   --  2.6*  --  2.3  PHOS  --   --  2.7  --  2.6    GFR: CrCl cannot be calculated (Unknown ideal weight.). Liver Function Tests: Recent Labs  Lab 08/25/22 0231 08/26/22 0659 08/27/22 1149  AST 79* 53*  --   ALT 91* 74*  --   ALKPHOS 87 75  --   BILITOT 1.1 0.7  --   PROT 7.2 6.4*  --   ALBUMIN 2.3* 1.9* 2.3*    No results for input(s): "LIPASE", "AMYLASE" in the last 168 hours. No results for input(s): "AMMONIA" in the last 168 hours. Coagulation Profile: No results for input(s): "INR", "PROTIME" in the last 168 hours. Cardiac Enzymes: No results for input(s): "CKTOTAL", "CKMB", "CKMBINDEX", "TROPONINI" in the last 168  hours. BNP (last 3 results) No results for input(s): "PROBNP" in the last 8760 hours. HbA1C: No results for input(s): "HGBA1C" in the last 72 hours. CBG: No results for input(s): "GLUCAP" in the last 168 hours. Lipid Profile: No results for input(s): "CHOL", "HDL", "LDLCALC", "TRIG", "CHOLHDL", "LDLDIRECT" in the last 72 hours. Thyroid Function Tests: No results for input(s): "TSH", "T4TOTAL", "FREET4", "T3FREE", "THYROIDAB" in the last 72 hours. Anemia Panel: No results for input(s): "VITAMINB12", "FOLATE", "FERRITIN", "TIBC", "IRON", "RETICCTPCT" in the last 72 hours. Urine analysis:    Component Value Date/Time   COLORURINE YELLOW 06/30/2022 2151   APPEARANCEUR HAZY (A) 06/30/2022 2151   LABSPEC 1.025 06/30/2022 2151   PHURINE 5.0 06/30/2022 2151   GLUCOSEU NEGATIVE 06/30/2022 2151   HGBUR LARGE (A) 06/30/2022 2151   BILIRUBINUR NEGATIVE 06/30/2022 2151   BILIRUBINUR negative 06/03/2016 1542   BILIRUBINUR neg 07/21/2014 1622   KETONESUR 20 (A) 06/30/2022 2151  PROTEINUR 30 (A) 06/30/2022 2151   UROBILINOGEN 0.2 06/03/2016 1542   UROBILINOGEN 0.2 12/20/2015 1535   NITRITE POSITIVE (A) 06/30/2022 2151   LEUKOCYTESUR SMALL (A) 06/30/2022 2151   Sepsis Labs: @LABRCNTIP (procalcitonin:4,lacticidven:4)  )No results found for this or any previous visit (from the past 240 hour(s)).       Radiology Studies: No results found.      Scheduled Meds:  apixaban  5 mg Oral BID   feeding supplement  237 mL Oral BID BM   ferrous sulfate  325 mg Oral QODAY   flecainide  50 mg Oral BID   free water  200 mL Oral Q4H   pantoprazole  40 mg Oral Daily   potassium chloride  40 mEq Oral Once   potassium chloride  60 mEq Oral Once   Continuous Infusions:  albumin human 25 g (08/27/22 1000)   dextrose 5 % and 0.9 % NaCl with KCl 20 mEq/L       LOS: 1 day    Time spent: 55 minutes.    08/29/22, MD  [facility] Pager #: [phone] 7PM-7AM contact night  coverage as above

## 2022-08-27 NOTE — Plan of Care (Signed)

## 2022-08-28 LAB — RENAL FUNCTION PANEL
Albumin: 2.6 g/dL — ABNORMAL LOW (ref 3.5–5.0)
Albumin: 2.5 g/dL — ABNORMAL LOW (ref 3.5–5.0)
Albumin: 3.1 g/dL — ABNORMAL LOW (ref 3.5–5.0)
Anion gap: 6 (ref 5–15)
Anion gap: 9 (ref 5–15)
Anion gap: 14 (ref 5–15)
BUN: 8 mg/dL (ref 8–23)
BUN: 6 mg/dL — ABNORMAL LOW (ref 8–23)
BUN: 5 mg/dL — ABNORMAL LOW (ref 8–23)
CO2: 25 mmol/L (ref 22–32)
CO2: 24 mmol/L (ref 22–32)
CO2: 20 mmol/L — ABNORMAL LOW (ref 22–32)
Calcium: 8.5 mg/dL — ABNORMAL LOW (ref 8.9–10.3)
Calcium: 8.6 mg/dL — ABNORMAL LOW (ref 8.9–10.3)
Calcium: 8.9 mg/dL (ref 8.9–10.3)
Chloride: 115 mmol/L — ABNORMAL HIGH (ref 98–111)
Chloride: 116 mmol/L — ABNORMAL HIGH (ref 98–111)
Chloride: 111 mmol/L (ref 98–111)
Creatinine, Ser: 0.58 mg/dL (ref 0.44–1.00)
Creatinine, Ser: 0.59 mg/dL (ref 0.44–1.00)
Creatinine, Ser: 0.6 mg/dL (ref 0.44–1.00)
GFR, Estimated: >60 mL/min (ref >60)
GFR, Estimated: >60 mL/min (ref >60)
GFR, Estimated: >60 mL/min (ref >60)
Glucose, Bld: 98 mg/dL (ref 70–99)
Glucose, Bld: 110 mg/dL — ABNORMAL HIGH (ref 70–99)
Glucose, Bld: 123 mg/dL — ABNORMAL HIGH (ref 70–99)
Phosphorus: 2.6 mg/dL (ref 2.5–4.6)
Phosphorus: 3 mg/dL (ref 2.5–4.6)
Phosphorus: 2.5 mg/dL (ref 2.5–4.6)
Potassium: 3.6 mmol/L (ref 3.5–5.1)
Potassium: 4.1 mmol/L (ref 3.5–5.1)
Potassium: 3.5 mmol/L (ref 3.5–5.1)
Sodium: 146 mmol/L — ABNORMAL HIGH (ref 135–145)
Sodium: 149 mmol/L — ABNORMAL HIGH (ref 135–145)
Sodium: 145 mmol/L (ref 135–145)

## 2022-08-28 LAB — URINALYSIS, ROUTINE W REFLEX MICROSCOPIC
Bilirubin Urine: NEGATIVE
Glucose, UA: NEGATIVE mg/dL
Ketones, ur: NEGATIVE mg/dL
Leukocytes,Ua: NEGATIVE
Nitrite: NEGATIVE
Protein, ur: NEGATIVE mg/dL
Specific Gravity, Urine: 1.014 (ref 1.005–1.030)
pH: 5 (ref 5.0–8.0)

## 2022-08-28 LAB — PROTEIN / CREATININE RATIO, URINE
Creatinine, Urine: 53 mg/dL
Protein Creatinine Ratio: 0.6 mg/mg{Cre} — ABNORMAL HIGH (ref 0.00–0.15)
Total Protein, Urine: 32 mg/dL

## 2022-08-28 LAB — MAGNESIUM: Magnesium: 2.1 mg/dL (ref 1.7–2.4)

## 2022-08-28 MED ORDER — POTASSIUM CHLORIDE 20 MEQ PO PACK
40.0000 meq | PACK | Freq: Once | ORAL | Status: AC
  Administered 2022-08-28: 40 meq via ORAL
  Filled 2022-08-28: qty 2

## 2022-08-28 NOTE — TOC Progression Note (Addendum)
Transition of Care [facility]) - Progression Note    Patient Details  Name: Donna Simmons MRN: [id number] Date of Birth: [birth year]  Transition of Care [facility]) CM/SW Contact  Carley Hammed, [place] Phone Number: 08/28/2022, 3:32 PM  Clinical Narrative:     CSW was advised by PT that pt's family is not wanting to pursue PT/OT services at SNF and were working with the facility to transition to LTC. Facility Liaison noted that their social worker had been working with the family, and this would need to be coordinated between their Child psychotherapist and the family. Liaison unsure of how far into the process family was, but they may need to pay for a month up front. CSW attempted to contact spouse to discuss, unable to leave a VM. CSW spoke with [place] with Social Work at [place] who noted she would review pt's information and would follow up with CSW for next steps. TOC will continue to follow for DC needs.    CSW received a return call from [facility], sw, and business office. They advised that prior to pt even returning, she has a $6015 balance that has not been paid. [name] has also failed to start a medicaid application and so LTC will be an out of pocket expense of approximately $7000, making the total for pt to return $13,850. If family pays this, pt will be moved to a semi private room as well. It is unlikely additional insurance authorizations will be approved as family is now stating pt has not been interactive and they do not want to pursue PT/OT services. These barriers to be discussed with the family and team. TOC will continue to follow for an appropriate disposition.     Expected Discharge Plan and Services                                               Social Determinants of Health (SDOH) Interventions SDOH Screenings   Food Insecurity: Unknown (07/03/2022)  Alcohol Screen: Low Risk  (04/12/2019)  Depression (PHQ2-9): Low Risk  (11/05/2020)  Tobacco Use: Low Risk   (08/20/2022)    Readmission Risk Interventions     No data to display

## 2022-08-28 NOTE — Progress Notes (Signed)
PT Cancellation Note  Patient Details Name: Donna Simmons MRN: [id number] DOB: February 24, 1941   Cancelled Treatment:    Reason Eval/Treat Not Completed: PT screened. Will sign-off.  Patient not arousing to voice and light touch.  I spoke with husband over the phone. He states attempts for meaningful participation with rehab team at SNF prior to admission have been minimal and that the family was in the process of transitioning to long term care. He hopes to have her transition to LTC at d/c and does not wish to pursue further PT or OT interventions at this time.   Kathlyn Sacramento, PT, DPT Physical Therapist Acute Rehabilitation Services [facility] Outpatient Rehabilitation Services [facility] 08/28/2022, 2:22 PM

## 2022-08-29 ENCOUNTER — Inpatient Hospital Stay (HOSPITAL_COMMUNITY): Payer: Medicare PPO

## 2022-08-29 DIAGNOSIS — E870 Hyperosmolality and hypernatremia: Secondary | ICD-10-CM | POA: Diagnosis not present

## 2022-08-29 LAB — RENAL FUNCTION PANEL
Albumin: 2.4 g/dL — ABNORMAL LOW (ref 3.5–5.0)
Albumin: 3.2 g/dL — ABNORMAL LOW (ref 3.5–5.0)
Anion gap: 4 — ABNORMAL LOW (ref 5–15)
Anion gap: 4 — ABNORMAL LOW (ref 5–15)
BUN: 5 mg/dL — ABNORMAL LOW (ref 8–23)
BUN: 5 mg/dL — ABNORMAL LOW (ref 8–23)
CO2: 26 mmol/L (ref 22–32)
CO2: 24 mmol/L (ref 22–32)
Calcium: 8.2 mg/dL — ABNORMAL LOW (ref 8.9–10.3)
Calcium: 8.6 mg/dL — ABNORMAL LOW (ref 8.9–10.3)
Chloride: 115 mmol/L — ABNORMAL HIGH (ref 98–111)
Chloride: 112 mmol/L — ABNORMAL HIGH (ref 98–111)
Creatinine, Ser: 0.62 mg/dL (ref 0.44–1.00)
Creatinine, Ser: 0.53 mg/dL (ref 0.44–1.00)
GFR, Estimated: >60 mL/min (ref >60)
GFR, Estimated: >60 mL/min (ref >60)
Glucose, Bld: 106 mg/dL — ABNORMAL HIGH (ref 70–99)
Glucose, Bld: 100 mg/dL — ABNORMAL HIGH (ref 70–99)
Phosphorus: 2.9 mg/dL (ref 2.5–4.6)
Phosphorus: 1.8 mg/dL — ABNORMAL LOW (ref 2.5–4.6)
Potassium: 3.3 mmol/L — ABNORMAL LOW (ref 3.5–5.1)
Potassium: 3.8 mmol/L (ref 3.5–5.1)
Sodium: 145 mmol/L (ref 135–145)
Sodium: 140 mmol/L (ref 135–145)

## 2022-08-29 MED ORDER — POTASSIUM CHLORIDE 10 MEQ/100ML IV SOLN
10.0000 meq | INTRAVENOUS | Status: AC
  Administered 2022-08-29 (×4): 10 meq via INTRAVENOUS
  Filled 2022-08-29 (×3): qty 100

## 2022-08-29 MED ORDER — POTASSIUM CHLORIDE 20 MEQ PO PACK
20.0000 meq | PACK | Freq: Once | ORAL | Status: AC
  Administered 2022-08-29: 20 meq via ORAL
  Filled 2022-08-29: qty 1

## 2022-08-29 MED ORDER — SODIUM CHLORIDE 0.9 % IV SOLN
1.0000 g | INTRAVENOUS | Status: DC
  Administered 2022-08-29 – 2022-08-30 (×2): 1 g via INTRAVENOUS
  Filled 2022-08-29 (×2): qty 10

## 2022-08-29 MED ORDER — POTASSIUM CHLORIDE 10 MEQ/100ML IV SOLN
10.0000 meq | INTRAVENOUS | Status: DC
  Filled 2022-08-29: qty 100

## 2022-08-29 NOTE — Progress Notes (Addendum)
       CROSS COVER NOTE  Bedside RN had questions regarding patient's CODE STATUS.  Patient currently has a full code order, however prior notes mention DNR.  I spoke over the phone with Mr. Romero Belling, the patient's husband.  CODE STATUS options were explained and questions were answered.  Based on this conversation, Mr. Norbeck decided that if the patient became pulseless, he would want her to receive CPR, DC shock, ACLS drugs, IV meds/fluids, and/or cardiac monitoring as needed.  However, if the patient were to stop breathing on her own, DO NOT INTUBATE (DNI) or mechanically ventilate.  CODE STATUS changed to partial code.  DO NOT INTUBATE (DNI) or mechanically ventilated.  If pulseless, okay with CPR, DC shock, ACLS drugs, IV meds/fluids, and/or cardiac monitoring as needed.  RN has been updated.  Anthoney Harada, DNP, ACNPC- AG [facility]

## 2022-08-29 NOTE — Care Management Important Message (Signed)
Important Message  Patient Details  Name: Donna Simmons MRN: [id number] Date of Birth: 10-29-1940   Medicare Important Message Given:  Yes     Sherilyn Banker 08/29/2022, 11:06 AM

## 2022-08-29 NOTE — TOC Progression Note (Addendum)
Transition of Care [facility]) - Progression Note    Patient Details  Name: Donna Simmons MRN: [id number] Date of Birth: [birth year]  Transition of Care [facility]) CM/SW Contact  Carley Hammed, [place] Phone Number: 08/29/2022, 3:05 PM  Clinical Narrative:     CSW spoke with pt's spouse via phone. Spouse was advised of the outstanding balance at [facility] along with the barriers to LTC. Out of pocket cost for a month upfront as well as medicaid were discussed. Spouse notes he may be able to pay for a month up front, but it will need to be a cheaper facility. CSW educated that cheaper tends to be synonymous with lower ratings, spouse noted understanding. CSW encouraged spouse to contact DSS today to start Medicaid application as this will be a major barrier to placement. Spouse states he would have if he thought she would qualify. CSW advised that, as discussed at the facility, needing LTC is a good reason to start the application as it increases the likelihood of being approved. Spouse stated he would call DSS today.  CSW stating pt will have to have a pending LTC Medicaid application pending before any facility will consider for LTC. They may accept with a month upfront, but will still likely be a difficult pt. CSW to fax out referral's and follow up with facilities to determine eligibility and costs associated. CSW to follow up with spouse before  EOD to determine if he has spoken with DSS. Per Spouse pt coming home is " not an option" as spouse is not able to physically care for her.  Medical team updated and CSW advised that palliative is being consulted for this pt. TOC will continue to follow for DC needs.     Several facilities contacted to discuss pt, VM's left.  [facility] reviewing, noted it is 11,000 for an upfront cost, for a month. Facility stated if Medicaid is pending and business office approves, they may be able to take pt without a fee. CSW followed up with spouse and he confirmed he has the  application and everything he needs to fill it out. He will do so and send it to the appropriate people. TOC will continue to follow.   Expected Discharge Plan and Services                                               Social Determinants of Health (SDOH) Interventions SDOH Screenings   Food Insecurity: Unknown (07/03/2022)  Alcohol Screen: Low Risk  (04/12/2019)  Depression (PHQ2-9): Low Risk  (11/05/2020)  Tobacco Use: Low Risk  (08/20/2022)    Readmission Risk Interventions     No data to display

## 2022-08-29 NOTE — Progress Notes (Signed)
PROGRESS NOTE    EVOLEHT HOVATTER  CVE:[id number] DOB: 09/05/1940 DOA: 08/25/2022 PCP: Yolanda Manges, DO  Outpatient Specialists:     Brief Narrative:  Patient is a 81 year old female with medical history significant for Alzheimer's dementia, hypertension, hyperlipidemia, permanent atrial fibrillation on Eliquis.  Patient was admitted to await failure to thrive, hyponatremia (sodium of 166), and poor p.o. intake.  Patient is currently on IV fluids D5 water at 75 cc/h.  Patient will also be on free water 200 cc every 4 hours.  Lab work done this morning, 08/26/2022 revealed sodium of 157, potassium of 3.6, magnesium of 3.6, albumin of 1.9, AST of 53, ALT of 74 and total protein of 6.4.  Will correct abnormal potassium.  Will continue to monitor renal function and electrolytes.  Will check urinalysis, looking for protein, consider drain globulin of 4.4 and albumin of 1.9.  08/26/2022: Lab work done today revealed sodium of 157, potassium of 2.6, chloride of 120, CO2 of 31, BUN of 24 with serum creatinine of 0.83.  Albumin is 1.9.  Free water.  Repeat potassium.  Magnesium is 2.6.  No significant history from patient.  Repeat renal panel every 6 hours.  08/27/2022: Patient seen.  No significant history from patient.  Discussed with patient's husband extensively.  Patient's husband has elected for patient to be DO NOT RESUSCITATE.  The husband also informed me that patient has been nonverbal for a while.  Electrolytes are improving.  BMP done today revealed sodium of 146, potassium of 3.3, chloride 114, CO2 26, BUN of 12 and serum creatinine of 0.61.  Will continue to monitor renal function and repeat weekly electrolytes.  08/29/2022: Patient seen.  Urine culture is growing E. coli.  Will proceed to blood culture.  IV Rocephin started.  Patient is better today.  Electrolytes monitored and corrected.   Assessment & Plan:   Principal Problem:   Hypernatremia Active Problems:   GERD  (gastroesophageal reflux disease)   Leukocytosis   Thrombocytosis   Hypokalemia   Hypoalbuminemia due to protein-calorie malnutrition (HCC)   Transaminitis   AKI (acute kidney injury) (HCC)   Permanent atrial fibrillation (HCC)   Iron deficiency anemia   Alzheimer's dementia (HCC)   Insomnia   Failure to thrive (child)   Hypernatremia: -Likely volume dependent. -On presentation, sodium was 166. -Sodium has improved to 157 today. -Continue D5 water. -Oral free water. -Monitor renal function and electrolytes every 6 hours. -Gentle drop in sodium desired. 08/27/2022: Patient seen.  See above documentation.  Sodium is down to 146. 08/29/2022: Resolved significantly.  Sodium is 145 today.   Leukocytosis possibly reactive -WBC is 10.6 today. -Smelly urine negative. -Urinalysis and urine culture to rule out UTI.   08/27/2022: Urinalysis and urine culture are still pending. 08/29/2022: No recent lab work.  Last WBC was 10.6.  UTI: -Urine cultures growing E. coli. -Blood cultures. -Continue Rocephin.   Thrombocytosis possibly reactive -Resolved.     Hypokalemia -Potassium is down to 2.6 today. -Replace potassium aggressively.   08/27/2022: Improving.  Potassium is 3.3 today. 08/29/2022: Potassium was 3.3 today.  Repleted.   Acute kidney injury: -Resolved.  Dehydration: -Hydrate patient. 08/29/2022: Resolved significantly.   Transaminitis: -Etiology unclear. -AST is 52 today ALT 74. -Total protein 6.4 with albumin of 1.9. -Continue to monitor for now.   08/29/2022: Repeat CMP tomorrow.   Hypoalbuminemia possibly secondary to moderate protein calorie malnutrition Albumin 2.3, protein supplement will be provided   Iron deficiency anemia Continue ferrous sulfate  Permanent atrial fibrillation Continue flecainide and Eliquis   GERD Continue Protonix   Insomnia Continue melatonin nightly as needed   Alzheimer's dementia Stable     DVT prophylaxis:  Eliquis Code Status: DO NOT RESUSCITATE.   Family Communication:  Disposition Plan: This will depend on hospital course   Consultants:  None.  Will have a low threshold to consult the palliative care team.  Procedures:  None  Antimicrobials:  None   Subjective: Patient is better today. Patient is trying to communicate some today.  Objective: Vitals:   08/29/22 1041 08/29/22 1712 08/29/22 1825 08/29/22 1826  BP: 102/69 114/77    Pulse: (!) 58 (!) 105 85   Resp: 16 17    Temp: 97.8 F (36.6 C) (!) 97.5 F (36.4 C)  98.2 F (36.8 C)  TempSrc: Oral Oral    SpO2: 93% (!) 73% 93%     Intake/Output Summary (Last 24 hours) at 08/29/2022 1840 Last data filed at 08/29/2022 1255 Gross per 24 hour  Intake 300 ml  Output --  Net 300 ml    There were no vitals filed for this visit.  Examination:  General exam: Patient seems to be improving.  Patient is more interactive today.  Respiratory system: Clear to auscultation.  Cardiovascular system: S1 & S2 heard Gastrointestinal system: Abdomen is soft and nontender.  Central nervous system: Awake.  Seems to move all extremities.  Extremities: No leg edema.  Data Reviewed: I have personally reviewed following labs and imaging studies  CBC: Recent Labs  Lab 08/25/22 0231 08/26/22 0659  WBC 12.4* 10.6*  NEUTROABS 11.2*  --   HGB 13.2 11.1*  HCT 44.0 37.8  MCV 84.3 84.6  PLT 449* 393    Basic Metabolic Panel: Recent Labs  Lab 08/26/22 0659 08/26/22 1631 08/27/22 1149 08/27/22 1857 08/28/22 0219 08/28/22 0955 08/28/22 1832 08/29/22 0651  NA 157*   < > 146* 147* 146* 149* 145 145  K 2.6*   < > 3.3* 3.8 3.6 4.1 3.5 3.3*  CL 120*   < > 114* 114* 115* 116* 111 115*  CO2 31   < > 26 24 25 24  20* 26  GLUCOSE 130*   < > 113* 96 98 110* 123* 106*  BUN 24*   < > 12 10 8  6* 5* 5*  CREATININE 0.83   < > 0.61 0.50 0.58 0.59 0.60 0.62  CALCIUM 8.5*   < > 8.2* 8.4* 8.5* 8.6* 8.9 8.2*  MG 2.6*  --  2.3  --  2.1  --   --    --   PHOS 2.7  --  2.6 2.6 2.6 3.0 2.5 2.9   < > = values in this interval not displayed.    GFR: CrCl cannot be calculated (Unknown ideal weight.). Liver Function Tests: Recent Labs  Lab 08/25/22 0231 08/26/22 0659 08/27/22 1149 08/27/22 1857 08/28/22 0219 08/28/22 0955 08/28/22 1832 08/29/22 0651  AST 79* 53*  --   --   --   --   --   --   ALT 91* 74*  --   --   --   --   --   --   ALKPHOS 87 75  --   --   --   --   --   --   BILITOT 1.1 0.7  --   --   --   --   --   --   PROT 7.2 6.4*  --   --   --   --   --   --  ALBUMIN 2.3* 1.9*   < > 2.7* 2.6* 2.5* 3.1* 2.4*   < > = values in this interval not displayed.    No results for input(s): "LIPASE", "AMYLASE" in the last 168 hours. No results for input(s): "AMMONIA" in the last 168 hours. Coagulation Profile: No results for input(s): "INR", "PROTIME" in the last 168 hours. Cardiac Enzymes: No results for input(s): "CKTOTAL", "CKMB", "CKMBINDEX", "TROPONINI" in the last 168 hours. BNP (last 3 results) No results for input(s): "PROBNP" in the last 8760 hours. HbA1C: No results for input(s): "HGBA1C" in the last 72 hours. CBG: No results for input(s): "GLUCAP" in the last 168 hours. Lipid Profile: No results for input(s): "CHOL", "HDL", "LDLCALC", "TRIG", "CHOLHDL", "LDLDIRECT" in the last 72 hours. Thyroid Function Tests: No results for input(s): "TSH", "T4TOTAL", "FREET4", "T3FREE", "THYROIDAB" in the last 72 hours. Anemia Panel: No results for input(s): "VITAMINB12", "FOLATE", "FERRITIN", "TIBC", "IRON", "RETICCTPCT" in the last 72 hours. Urine analysis:    Component Value Date/Time   COLORURINE YELLOW 08/28/2022 1430   APPEARANCEUR HAZY (A) 08/28/2022 1430   LABSPEC 1.014 08/28/2022 1430   PHURINE 5.0 08/28/2022 1430   GLUCOSEU NEGATIVE 08/28/2022 1430   HGBUR MODERATE (A) 08/28/2022 1430   BILIRUBINUR NEGATIVE 08/28/2022 1430   BILIRUBINUR negative 06/03/2016 1542   BILIRUBINUR neg 07/21/2014 1622    KETONESUR NEGATIVE 08/28/2022 1430   PROTEINUR NEGATIVE 08/28/2022 1430   UROBILINOGEN 0.2 06/03/2016 1542   UROBILINOGEN 0.2 12/20/2015 1535   NITRITE NEGATIVE 08/28/2022 1430   LEUKOCYTESUR NEGATIVE 08/28/2022 1430   Sepsis Labs: @LABRCNTIP (procalcitonin:4,lacticidven:4)  ) Recent Results (from the past 240 hour(s))  Urine Culture     Status: Abnormal (Preliminary result)   Collection Time: 08/28/22  2:30 PM   Specimen: Urine, Clean Catch  Result Value Ref Range Status   Specimen Description URINE, CLEAN CATCH  Final   Special Requests NONE  Final   Culture (A)  Final    >=100,000 COLONIES/mL ESCHERICHIA COLI SUSCEPTIBILITIES TO FOLLOW Performed at [facility], [address] [place], [place]    Report Status PENDING  Incomplete         Radiology Studies: DG Pelvis 1-2 Views  Result Date: 08/29/2022 CLINICAL DATA:  Hip pain. EXAM: PELVIS - 1-2 VIEW COMPARISON:  AP pelvis 06/30/2022 FINDINGS: There is a new curvilinear lucency indicating an acute fracture of the proximal right femoral neck with mild superior displacement of the distal fracture component with respect to the proximal fracture component. Mild-to-moderate bilateral femoroacetabular joint space narrowing. IMPRESSION: New, acute fracture of the proximal right femoral neck with mild superior displacement of the distal fracture component. Electronically Signed   By: 07/02/2022 M.D.   On: 08/29/2022 15:25        Scheduled Meds:  apixaban  5 mg Oral BID   feeding supplement  237 mL Oral BID BM   ferrous sulfate  325 mg Oral QODAY   flecainide  50 mg Oral BID   free water  200 mL Oral Q4H   pantoprazole  40 mg Oral Daily   Continuous Infusions:  cefTRIAXone (ROCEPHIN)  IV 1 g (08/29/22 1159)   dextrose 5 % and 0.9 % NaCl with KCl 20 mEq/L 50 mL/hr at 08/29/22 0509     LOS: 3 days    Time spent: 55 minutes.    08/31/22, MD  [facility] Pager #: [phone] 7PM-7AM contact night coverage as above

## 2022-08-29 NOTE — Progress Notes (Signed)
OT Cancellation Note  Patient Details Name: Donna Simmons MRN: [id number] DOB: Apr 19, 1941   Cancelled Treatment:    Reason Eval/Treat Not Completed: Other (comment).  Per PT note from yesterday and TOC CM/SW notes pt's family states pt was not participating in therapy sessions at SNF and do not wish to continue with OT or PT at this time.  Will sign off and defer eval.  Merville Hijazi OTR/L ,08/29/2022, 8:52 AM

## 2022-08-29 NOTE — Progress Notes (Signed)
   08/29/22 1826  Assess: MEWS Score  Temp 98.2 F (36.8 C) (axillary)  Assess: MEWS Score  MEWS Temp 0  MEWS Systolic 0  MEWS Pulse 0  MEWS RR 0  MEWS LOC 0  MEWS Score 0  MEWS Score Color Green  Assess: if the MEWS score is Yellow or Red  Were vital signs taken at a resting state? Yes  Does the patient meet 2 or more of the SIRS criteria? No  MEWS guidelines implemented *See Row Information* No, vital signs rechecked  Treat  Pain Scale PAINAD  Breathing 0  Negative Vocalization 0  Facial Expression 0  Body Language 0  Consolability 0  PAINAD Score 0  Assess: SIRS CRITERIA  SIRS Temperature  0  SIRS Pulse 0  SIRS Respirations  0  SIRS WBC 0  SIRS Score Sum  0   Recheck vitals patient is stable and resting in bed.

## 2022-08-30 DIAGNOSIS — E870 Hyperosmolality and hypernatremia: Secondary | ICD-10-CM | POA: Diagnosis not present

## 2022-08-30 LAB — COMPREHENSIVE METABOLIC PANEL
ALT: 31 U/L (ref 0–44)
AST: 24 U/L (ref 15–41)
Albumin: 3 g/dL — ABNORMAL LOW (ref 3.5–5.0)
Alkaline Phosphatase: 57 U/L (ref 38–126)
Anion gap: 8 (ref 5–15)
BUN: <5 mg/dL — ABNORMAL LOW (ref 8–23)
CO2: 23 mmol/L (ref 22–32)
Calcium: 8.6 mg/dL — ABNORMAL LOW (ref 8.9–10.3)
Chloride: 112 mmol/L — ABNORMAL HIGH (ref 98–111)
Creatinine, Ser: 0.5 mg/dL (ref 0.44–1.00)
GFR, Estimated: >60 mL/min (ref >60)
Glucose, Bld: 88 mg/dL (ref 70–99)
Potassium: 3.6 mmol/L (ref 3.5–5.1)
Sodium: 143 mmol/L (ref 135–145)
Total Bilirubin: 1 mg/dL (ref 0.3–1.2)
Total Protein: 6 g/dL — ABNORMAL LOW (ref 6.5–8.1)

## 2022-08-30 LAB — CBC WITH DIFFERENTIAL/PLATELET
Abs Immature Granulocytes: 0.04 10*3/uL (ref 0.00–0.07)
Basophils Absolute: 0 10*3/uL (ref 0.0–0.1)
Basophils Relative: 0 %
Eosinophils Absolute: 0 10*3/uL (ref 0.0–0.5)
Eosinophils Relative: 0 %
HCT: 32.2 % — ABNORMAL LOW (ref 36.0–46.0)
Hemoglobin: 10.2 g/dL — ABNORMAL LOW (ref 12.0–15.0)
Immature Granulocytes: 1 %
Lymphocytes Relative: 15 %
Lymphs Abs: 1.1 10*3/uL (ref 0.7–4.0)
MCH: 25.2 pg — ABNORMAL LOW (ref 26.0–34.0)
MCHC: 31.7 g/dL (ref 30.0–36.0)
MCV: 79.5 fL — ABNORMAL LOW (ref 80.0–100.0)
Monocytes Absolute: 0.4 10*3/uL (ref 0.1–1.0)
Monocytes Relative: 5 %
Neutro Abs: 6.2 10*3/uL (ref 1.7–7.7)
Neutrophils Relative %: 79 %
Platelets: 261 10*3/uL (ref 150–400)
RBC: 4.05 MIL/uL (ref 3.87–5.11)
RDW: 15.6 % — ABNORMAL HIGH (ref 11.5–15.5)
WBC: 7.8 10*3/uL (ref 4.0–10.5)
nRBC: 0 % (ref 0.0–0.2)

## 2022-08-30 LAB — URINE CULTURE: Culture: >=100000 — AB

## 2022-08-30 LAB — RENAL FUNCTION PANEL
Albumin: 2.8 g/dL — ABNORMAL LOW (ref 3.5–5.0)
Albumin: 3 g/dL — ABNORMAL LOW (ref 3.5–5.0)
Anion gap: 10 (ref 5–15)
Anion gap: 8 (ref 5–15)
BUN: <5 mg/dL — ABNORMAL LOW (ref 8–23)
BUN: <5 mg/dL — ABNORMAL LOW (ref 8–23)
CO2: 23 mmol/L (ref 22–32)
CO2: 24 mmol/L (ref 22–32)
Calcium: 8.5 mg/dL — ABNORMAL LOW (ref 8.9–10.3)
Calcium: 8.5 mg/dL — ABNORMAL LOW (ref 8.9–10.3)
Chloride: 108 mmol/L (ref 98–111)
Chloride: 107 mmol/L (ref 98–111)
Creatinine, Ser: 0.56 mg/dL (ref 0.44–1.00)
Creatinine, Ser: 0.55 mg/dL (ref 0.44–1.00)
GFR, Estimated: >60 mL/min (ref >60)
GFR, Estimated: >60 mL/min (ref >60)
Glucose, Bld: 107 mg/dL — ABNORMAL HIGH (ref 70–99)
Glucose, Bld: 79 mg/dL (ref 70–99)
Phosphorus: 2.4 mg/dL — ABNORMAL LOW (ref 2.5–4.6)
Phosphorus: 2.3 mg/dL — ABNORMAL LOW (ref 2.5–4.6)
Potassium: 3.2 mmol/L — ABNORMAL LOW (ref 3.5–5.1)
Potassium: 4.5 mmol/L (ref 3.5–5.1)
Sodium: 141 mmol/L (ref 135–145)
Sodium: 139 mmol/L (ref 135–145)

## 2022-08-30 LAB — MAGNESIUM: Magnesium: 1.9 mg/dL (ref 1.7–2.4)

## 2022-08-30 LAB — PHOSPHORUS: Phosphorus: 2 mg/dL — ABNORMAL LOW (ref 2.5–4.6)

## 2022-08-30 MED ORDER — SODIUM CHLORIDE 0.9 % IV SOLN
1.0000 g | INTRAVENOUS | Status: DC
  Administered 2022-08-30 – 2022-09-02 (×4): 1000 mg via INTRAVENOUS
  Filled 2022-08-30 (×4): qty 1

## 2022-08-30 MED ORDER — POTASSIUM CHLORIDE 20 MEQ PO PACK
40.0000 meq | PACK | ORAL | Status: AC
  Administered 2022-08-30 (×2): 40 meq via ORAL
  Filled 2022-08-30 (×2): qty 2

## 2022-08-30 MED ORDER — MAGNESIUM SULFATE IN D5W 1-5 GM/100ML-% IV SOLN
1.0000 g | Freq: Once | INTRAVENOUS | Status: AC
  Administered 2022-08-30: 1 g via INTRAVENOUS
  Filled 2022-08-30: qty 100

## 2022-08-30 NOTE — Progress Notes (Signed)
PROGRESS NOTE    Donna Simmons  GPQ:[id number] DOB: 1941-08-04 DOA: 08/25/2022 PCP: Yolanda Manges, DO  Outpatient Specialists:     Brief Narrative:  Patient is a 81 year old female with medical history significant for Alzheimer's dementia, hypertension, hyperlipidemia, permanent atrial fibrillation on Eliquis.  Patient was admitted to await failure to thrive, hyponatremia (sodium of 166), and poor p.o. intake.  Patient is currently on IV fluids D5 water at 75 cc/h.  Patient will also be on free water 200 cc every 4 hours.  Lab work done this morning, 08/26/2022 revealed sodium of 157, potassium of 3.6, magnesium of 3.6, albumin of 1.9, AST of 53, ALT of 74 and total protein of 6.4.  Will correct abnormal potassium.  Will continue to monitor renal function and electrolytes.  Will check urinalysis, looking for protein, consider drain globulin of 4.4 and albumin of 1.9.  08/26/2022: Lab work done today revealed sodium of 157, potassium of 2.6, chloride of 120, CO2 of 31, BUN of 24 with serum creatinine of 0.83.  Albumin is 1.9.  Free water.  Repeat potassium.  Magnesium is 2.6.  No significant history from patient.  Repeat renal panel every 6 hours.  08/27/2022: Patient seen.  No significant history from patient.  Discussed with patient's husband extensively.  Patient's husband has elected for patient to be DO NOT RESUSCITATE.  The husband also informed me that patient has been nonverbal for a while.  Electrolytes are improving.  BMP done today revealed sodium of 146, potassium of 3.3, chloride 114, CO2 26, BUN of 12 and serum creatinine of 0.61.  Will continue to monitor renal function and repeat weekly electrolytes.  08/29/2022: Patient seen.  Urine culture is growing E. coli.  Will proceed to blood culture.  IV Rocephin started.  Patient is better today.  Electrolytes monitored and corrected.  08/30/2022: Urine culture grew E. coli ESBL.  Will change antibiotics from Rocephin to IV ertapenem.   Patient seen alongside patient's husband.  Patient is clinically improving.   Assessment & Plan:   Principal Problem:   Hypernatremia Active Problems:   GERD (gastroesophageal reflux disease)   Leukocytosis   Thrombocytosis   Hypokalemia   Hypoalbuminemia due to protein-calorie malnutrition (HCC)   Transaminitis   AKI (acute kidney injury) (HCC)   Permanent atrial fibrillation (HCC)   Iron deficiency anemia   Alzheimer's dementia (HCC)   Insomnia   Failure to thrive (child)   Hypernatremia: -Likely volume dependent. -On presentation, sodium was 166. -Sodium has improved to 157 today. -Continue D5 water. -Oral free water. -Monitor renal function and electrolytes every 6 hours. -Gentle drop in sodium desired. 08/27/2022: Patient seen.  See above documentation.  Sodium is down to 146. 08/29/2022: Resolved significantly.  Sodium is 145 today. 08/30/2022: Resolved.  Sodium is 139 today.   Leukocytosis possibly reactive -WBC is 10.6 today. -Smelly urine negative. -Urinalysis and urine culture to rule out UTI.   08/27/2022: Urinalysis and urine culture are still pending. 08/29/2022: No recent lab work.  Last WBC was 10.6.  UTI: -Urine cultures growing E. coli. -Blood cultures. 08/30/2022: Urine culture grew E. coli ESBL.  Discontinue Rocephin.  Start IV ertapenem.  Follow blood cultures.   Thrombocytosis possibly reactive -Resolved.     Hypokalemia -Resolved. -Potassium is 4.5 today..   Acute kidney injury: -Resolved.  Dehydration: -Hydrate patient. 08/29/2022: Resolved significantly.   Transaminitis: -Etiology unclear. -AST is 52 today ALT 74. -Total protein 6.4 with albumin of 1.9. -Continue to monitor for now.  08/29/2022: Repeat CMP tomorrow.   Hypoalbuminemia possibly secondary to moderate protein calorie malnutrition Albumin 2.3, protein supplement will be provided   Iron deficiency anemia Continue ferrous sulfate   Permanent atrial  fibrillation Continue flecainide and Eliquis   GERD Continue Protonix   Insomnia Continue melatonin nightly as needed   Alzheimer's dementia Stable     DVT prophylaxis: Eliquis Code Status: DO NOT RESUSCITATE.   Family Communication:  Disposition Plan: This will depend on hospital course   Consultants:  None.  Will have a low threshold to consult the palliative care team.  Procedures:  None  Antimicrobials:  IV Rocephin discontinued. IV ertapenem started today, 08/30/2022.   Subjective: Patient seen alongside patient's husband. No new complaints.  Objective: Vitals:   08/30/22 0343 08/30/22 0806 08/30/22 1300 08/30/22 1544  BP: (!) 107/91 122/70  107/83  Pulse: 89   70  Resp: 18 18  16   Temp: (!) 97.5 F (36.4 C) 98.2 F (36.8 C)  97.8 F (36.6 C)  TempSrc: Oral Axillary    SpO2: 100% 92%  92%  Weight:   50.2 kg   Height:   5\' 2"  (1.575 m)     Intake/Output Summary (Last 24 hours) at 08/30/2022 2010 Last data filed at 08/30/2022 1829 Gross per 24 hour  Intake 2495.66 ml  Output 750 ml  Net 1745.66 ml    Filed Weights   08/30/22 1300  Weight: 50.2 kg    Examination:  General exam: Patient seems to be improving.  Patient is more interactive today.  Respiratory system: Clear to auscultation.  Cardiovascular system: S1 & S2 heard Gastrointestinal system: Abdomen is soft and nontender.  Central nervous system: Awake.  Seems to move all extremities.  Extremities: No leg edema.  Data Reviewed: I have personally reviewed following labs and imaging studies  CBC: Recent Labs  Lab 08/25/22 0231 08/26/22 0659 08/30/22 0215  WBC 12.4* 10.6* 7.8  NEUTROABS 11.2*  --  6.2  HGB 13.2 11.1* 10.2*  HCT 44.0 37.8 32.2*  MCV 84.3 84.6 79.5*  PLT 449* 393 261    Basic Metabolic Panel: Recent Labs  Lab 08/26/22 0659 08/26/22 1631 08/27/22 1149 08/27/22 1857 08/28/22 0219 08/28/22 0955 08/29/22 0651 08/29/22 1928 08/30/22 0215 08/30/22 1055  08/30/22 1823  NA 157*   < > 146*   < > 146*   < > 145 140 143 141 139  K 2.6*   < > 3.3*   < > 3.6   < > 3.3* 3.8 3.6 3.2* 4.5  CL 120*   < > 114*   < > 115*   < > 115* 112* 112* 108 107  CO2 31   < > 26   < > 25   < > 26 24 23 23 24   GLUCOSE 130*   < > 113*   < > 98   < > 106* 100* 88 107* 79  BUN 24*   < > 12   < > 8   < > 5* 5* <5* <5* <5*  CREATININE 0.83   < > 0.61   < > 0.58   < > 0.62 0.53 0.50 0.56 0.55  CALCIUM 8.5*   < > 8.2*   < > 8.5*   < > 8.2* 8.6* 8.6* 8.5* 8.5*  MG 2.6*  --  2.3  --  2.1  --   --   --  1.9  --   --   PHOS 2.7  --  2.6   < >  2.6   < > 2.9 1.8* 2.0* 2.4* 2.3*   < > = values in this interval not displayed.    GFR: Estimated Creatinine Clearance: 45.1 mL/min (by C-G formula based on SCr of 0.55 mg/dL). Liver Function Tests: Recent Labs  Lab 08/25/22 0231 08/26/22 0659 08/27/22 1149 08/29/22 0651 08/29/22 1928 08/30/22 0215 08/30/22 1055 08/30/22 1823  AST 79* 53*  --   --   --  24  --   --   ALT 91* 74*  --   --   --  31  --   --   ALKPHOS 87 75  --   --   --  57  --   --   BILITOT 1.1 0.7  --   --   --  1.0  --   --   PROT 7.2 6.4*  --   --   --  6.0*  --   --   ALBUMIN 2.3* 1.9*   < > 2.4* 3.2* 3.0* 2.8* 3.0*   < > = values in this interval not displayed.    No results for input(s): "LIPASE", "AMYLASE" in the last 168 hours. No results for input(s): "AMMONIA" in the last 168 hours. Coagulation Profile: No results for input(s): "INR", "PROTIME" in the last 168 hours. Cardiac Enzymes: No results for input(s): "CKTOTAL", "CKMB", "CKMBINDEX", "TROPONINI" in the last 168 hours. BNP (last 3 results) No results for input(s): "PROBNP" in the last 8760 hours. HbA1C: No results for input(s): "HGBA1C" in the last 72 hours. CBG: No results for input(s): "GLUCAP" in the last 168 hours. Lipid Profile: No results for input(s): "CHOL", "HDL", "LDLCALC", "TRIG", "CHOLHDL", "LDLDIRECT" in the last 72 hours. Thyroid Function Tests: No results for  input(s): "TSH", "T4TOTAL", "FREET4", "T3FREE", "THYROIDAB" in the last 72 hours. Anemia Panel: No results for input(s): "VITAMINB12", "FOLATE", "FERRITIN", "TIBC", "IRON", "RETICCTPCT" in the last 72 hours. Urine analysis:    Component Value Date/Time   COLORURINE YELLOW 08/28/2022 1430   APPEARANCEUR HAZY (A) 08/28/2022 1430   LABSPEC 1.014 08/28/2022 1430   PHURINE 5.0 08/28/2022 1430   GLUCOSEU NEGATIVE 08/28/2022 1430   HGBUR MODERATE (A) 08/28/2022 1430   BILIRUBINUR NEGATIVE 08/28/2022 1430   BILIRUBINUR negative 06/03/2016 1542   BILIRUBINUR neg 07/21/2014 1622   KETONESUR NEGATIVE 08/28/2022 1430   PROTEINUR NEGATIVE 08/28/2022 1430   UROBILINOGEN 0.2 06/03/2016 1542   UROBILINOGEN 0.2 12/20/2015 1535   NITRITE NEGATIVE 08/28/2022 1430   LEUKOCYTESUR NEGATIVE 08/28/2022 1430   Sepsis Labs: @LABRCNTIP (procalcitonin:4,lacticidven:4)  ) Recent Results (from the past 240 hour(s))  Urine Culture     Status: Abnormal   Collection Time: 08/28/22  2:30 PM   Specimen: Urine, Clean Catch  Result Value Ref Range Status   Specimen Description URINE, CLEAN CATCH  Final   Special Requests   Final    NONE Performed at [facility], [address] [place], [place]    Culture (A)  Final    >=100,000 COLONIES/mL ESCHERICHIA COLI Confirmed Extended Spectrum Beta-Lactamase Producer (ESBL).  In bloodstream infections from ESBL organisms, carbapenems are preferred over piperacillin/tazobactam. They are shown to have a lower risk of mortality.    Report Status 08/30/2022 FINAL  Final   Organism ID, Bacteria ESCHERICHIA COLI (A)  Final      Susceptibility   Escherichia coli - MIC*    AMPICILLIN >=32 RESISTANT Resistant     CEFAZOLIN >=64 RESISTANT Resistant     CEFEPIME >=32 RESISTANT Resistant  CEFTRIAXONE >=64 RESISTANT Resistant     CIPROFLOXACIN >=4 RESISTANT Resistant     GENTAMICIN <=1 SENSITIVE Sensitive     IMIPENEM 0.5 SENSITIVE Sensitive      NITROFURANTOIN 32 SENSITIVE Sensitive     TRIMETH/SULFA >=320 RESISTANT Resistant     AMPICILLIN/SULBACTAM >=32 RESISTANT Resistant     PIP/TAZO 16 SENSITIVE Sensitive     * >=100,000 COLONIES/mL ESCHERICHIA COLI  Culture, blood (Routine X 2) w Reflex to ID Panel     Status: None (Preliminary result)   Collection Time: 08/29/22  7:29 PM   Specimen: BLOOD RIGHT ARM  Result Value Ref Range Status   Specimen Description BLOOD RIGHT ARM  Final   Special Requests   Final    BOTTLES DRAWN AEROBIC AND ANAEROBIC Blood Culture adequate volume   Culture   Final    NO GROWTH < 12 HOURS Performed at [facility], [address], [place], [place] [address]    Report Status PENDING  Incomplete  Culture, blood (Routine X 2) w Reflex to ID Panel     Status: None (Preliminary result)   Collection Time: 08/29/22  7:30 PM   Specimen: BLOOD RIGHT ARM  Result Value Ref Range Status   Specimen Description BLOOD RIGHT ARM  Final   Special Requests   Final    BOTTLES DRAWN AEROBIC AND ANAEROBIC Blood Culture adequate volume   Culture   Final    NO GROWTH < 12 HOURS Performed at [facility], [address], [place], [place] [address]    Report Status PENDING  Incomplete          Radiology Studies: DG Pelvis 1-2 Views  Result Date: 08/29/2022 CLINICAL DATA:  Hip pain. EXAM: PELVIS - 1-2 VIEW COMPARISON:  AP pelvis 06/30/2022 FINDINGS: There is a new curvilinear lucency indicating an acute fracture of the proximal right femoral neck with mild superior displacement of the distal fracture component with respect to the proximal fracture component. Mild-to-moderate bilateral femoroacetabular joint space narrowing. IMPRESSION: New, acute fracture of the proximal right femoral neck with mild superior displacement of the distal fracture component. Electronically Signed   By: Neita Garnet M.D.   On: 08/29/2022 15:25        Scheduled Meds:  apixaban  5 mg Oral BID   feeding supplement  237  mL Oral BID BM   ferrous sulfate  325 mg Oral QODAY   flecainide  50 mg Oral BID   free water  200 mL Oral Q4H   pantoprazole  40 mg Oral Daily   Continuous Infusions:  dextrose 5 % and 0.9 % NaCl with KCl 20 mEq/L 50 mL/hr at 08/30/22 0343   ertapenem 1,000 mg (08/30/22 1613)     LOS: 4 days    Time spent: 55 minutes.    Berton Mount, MD  [facility] Pager #: [phone] 7PM-7AM contact night coverage as above

## 2022-08-31 DIAGNOSIS — E870 Hyperosmolality and hypernatremia: Secondary | ICD-10-CM | POA: Diagnosis not present

## 2022-08-31 LAB — RENAL FUNCTION PANEL
Albumin: 2.8 g/dL — ABNORMAL LOW (ref 3.5–5.0)
Anion gap: 10 (ref 5–15)
BUN: <5 mg/dL — ABNORMAL LOW (ref 8–23)
CO2: 22 mmol/L (ref 22–32)
Calcium: 8.3 mg/dL — ABNORMAL LOW (ref 8.9–10.3)
Chloride: 108 mmol/L (ref 98–111)
Creatinine, Ser: 0.48 mg/dL (ref 0.44–1.00)
GFR, Estimated: >60 mL/min (ref >60)
Glucose, Bld: 93 mg/dL (ref 70–99)
Phosphorus: 2.5 mg/dL (ref 2.5–4.6)
Potassium: 4.3 mmol/L (ref 3.5–5.1)
Sodium: 140 mmol/L (ref 135–145)

## 2022-08-31 LAB — MAGNESIUM: Magnesium: 2 mg/dL (ref 1.7–2.4)

## 2022-08-31 NOTE — Progress Notes (Signed)
PROGRESS NOTE    Donna Simmons  NID:[id number] DOB: 1940-12-22 DOA: 08/25/2022 PCP: Yolanda Manges, DO  Outpatient Specialists:     Brief Narrative:  Patient is a 81 year old female with medical history significant for Alzheimer's dementia, hypertension, hyperlipidemia, permanent atrial fibrillation on Eliquis.  Patient was admitted to await failure to thrive, hyponatremia (sodium of 166), and poor p.o. intake.  Patient is currently on IV fluids D5 water at 75 cc/h.  Patient will also be on free water 200 cc every 4 hours.  Lab work done this morning, 08/26/2022 revealed sodium of 157, potassium of 3.6, magnesium of 3.6, albumin of 1.9, AST of 53, ALT of 74 and total protein of 6.4.  Will correct abnormal potassium.  Will continue to monitor renal function and electrolytes.  Will check urinalysis, looking for protein, consider drain globulin of 4.4 and albumin of 1.9.  08/26/2022: Lab work done today revealed sodium of 157, potassium of 2.6, chloride of 120, CO2 of 31, BUN of 24 with serum creatinine of 0.83.  Albumin is 1.9.  Free water.  Repeat potassium.  Magnesium is 2.6.  No significant history from patient.  Repeat renal panel every 6 hours.  08/27/2022: Patient seen.  No significant history from patient.  Discussed with patient's husband extensively.  Patient's husband has elected for patient to be DO NOT RESUSCITATE.  The husband also informed me that patient has been nonverbal for a while.  Electrolytes are improving.  BMP done today revealed sodium of 146, potassium of 3.3, chloride 114, CO2 26, BUN of 12 and serum creatinine of 0.61.  Will continue to monitor renal function and repeat weekly electrolytes.  08/29/2022: Patient seen.  Urine culture is growing E. coli.  Will proceed to blood culture.  IV Rocephin started.  Patient is better today.  Electrolytes monitored and corrected.  08/30/2022: Urine culture grew E. coli ESBL.  Will change antibiotics from Rocephin to IV ertapenem.   Patient seen alongside patient's husband.  Patient is clinically improving.  08/31/2022: Patient seen.  No new changes.  Complete course of antibiotics.  Pursue disposition after completion of antibiotics.   Assessment & Plan:   Principal Problem:   Hypernatremia Active Problems:   GERD (gastroesophageal reflux disease)   Leukocytosis   Thrombocytosis   Hypokalemia   Hypoalbuminemia due to protein-calorie malnutrition (HCC)   Transaminitis   AKI (acute kidney injury) (HCC)   Permanent atrial fibrillation (HCC)   Iron deficiency anemia   Alzheimer's dementia (HCC)   Insomnia   Failure to thrive (child)   Hypernatremia: -Likely volume dependent. -On presentation, sodium was 166. -Sodium has improved to 157 today. -Continue D5 water. -Oral free water. -Monitor renal function and electrolytes every 6 hours. -Gentle drop in sodium desired. 08/27/2022: Patient seen.  See above documentation.  Sodium is down to 146. 08/29/2022: Resolved significantly.  Sodium is 145 today. 08/30/2022: Resolved.  Sodium is 139 today.   Leukocytosis possibly reactive -WBC is 10.6 today. -Smelly urine negative. -Urinalysis and urine culture to rule out UTI.   08/27/2022: Urinalysis and urine culture are still pending. 08/29/2022: No recent lab work.  Last WBC was 10.6.  UTI: -Urine cultures growing E. coli. -Blood cultures. 08/30/2022: Urine culture grew E. coli ESBL.  Discontinue Rocephin.  Start IV ertapenem.  Follow blood cultures.   Thrombocytosis possibly reactive -Resolved.     Hypokalemia -Resolved. -Potassium is 4.3 today..   Acute kidney injury: -Resolved.  Dehydration: -Hydrate patient. 08/29/2022: Resolved significantly.   Transaminitis: -  Etiology unclear. -AST is 52 today ALT 74. -Total protein 6.4 with albumin of 1.9. -Continue to monitor for now.   08/29/2022: Repeat CMP tomorrow.   Hypoalbuminemia possibly secondary to moderate protein calorie  malnutrition Albumin 2.3, protein supplement will be provided   Iron deficiency anemia Continue ferrous sulfate   Permanent atrial fibrillation Continue flecainide and Eliquis   GERD Continue Protonix   Insomnia Continue melatonin nightly as needed   Alzheimer's dementia Stable     DVT prophylaxis: Eliquis Code Status: DO NOT RESUSCITATE.   Family Communication:  Disposition Plan: This will depend on hospital course   Consultants:  None.  Will have a low threshold to consult the palliative care team.  Procedures:  None  Antimicrobials:  IV Rocephin discontinued. IV ertapenem started today, 08/30/2022.   Subjective: Patient seen alongside patient's husband. No new complaints.  Objective: Vitals:   08/30/22 2102 08/31/22 0539 08/31/22 0646 08/31/22 0741  BP: 112/69 (!) 197/168 111/65 106/61  Pulse: (!) 58 95 93 90  Resp: 17 17  16   Temp: 97.8 F (36.6 C) 98.9 F (37.2 C)    TempSrc:  Oral    SpO2:  99%  100%  Weight:      Height:        Intake/Output Summary (Last 24 hours) at 08/31/2022 1626 Last data filed at 08/31/2022 09/02/2022 Gross per 24 hour  Intake 878.22 ml  Output 650 ml  Net 228.22 ml    Filed Weights   08/30/22 1300  Weight: 50.2 kg    Examination:  General exam: Patient seems to be improving.  Patient is more interactive today.  Respiratory system: Clear to auscultation.  Cardiovascular system: S1 & S2 heard Gastrointestinal system: Abdomen is soft and nontender.  Central nervous system: Awake.  Seems to move all extremities.  Extremities: No leg edema.  Data Reviewed: I have personally reviewed following labs and imaging studies  CBC: Recent Labs  Lab 08/25/22 0231 08/26/22 0659 08/30/22 0215  WBC 12.4* 10.6* 7.8  NEUTROABS 11.2*  --  6.2  HGB 13.2 11.1* 10.2*  HCT 44.0 37.8 32.2*  MCV 84.3 84.6 79.5*  PLT 449* 393 261    Basic Metabolic Panel: Recent Labs  Lab 08/26/22 0659 08/26/22 1631 08/27/22 1149  08/27/22 1857 08/28/22 0219 08/28/22 0955 08/29/22 1928 08/30/22 0215 08/30/22 1055 08/30/22 1823 08/31/22 0200  NA 157*   < > 146*   < > 146*   < > 140 143 141 139 140  K 2.6*   < > 3.3*   < > 3.6   < > 3.8 3.6 3.2* 4.5 4.3  CL 120*   < > 114*   < > 115*   < > 112* 112* 108 107 108  CO2 31   < > 26   < > 25   < > 24 23 23 24 22   GLUCOSE 130*   < > 113*   < > 98   < > 100* 88 107* 79 93  BUN 24*   < > 12   < > 8   < > 5* <5* <5* <5* <5*  CREATININE 0.83   < > 0.61   < > 0.58   < > 0.53 0.50 0.56 0.55 0.48  CALCIUM 8.5*   < > 8.2*   < > 8.5*   < > 8.6* 8.6* 8.5* 8.5* 8.3*  MG 2.6*  --  2.3  --  2.1  --   --  1.9  --   --  2.0  PHOS 2.7  --  2.6   < > 2.6   < > 1.8* 2.0* 2.4* 2.3* 2.5   < > = values in this interval not displayed.    GFR: Estimated Creatinine Clearance: 45.1 mL/min (by C-G formula based on SCr of 0.48 mg/dL). Liver Function Tests: Recent Labs  Lab 08/25/22 0231 08/26/22 0659 08/27/22 1149 08/29/22 1928 08/30/22 0215 08/30/22 1055 08/30/22 1823 08/31/22 0200  AST 79* 53*  --   --  24  --   --   --   ALT 91* 74*  --   --  31  --   --   --   ALKPHOS 87 75  --   --  57  --   --   --   BILITOT 1.1 0.7  --   --  1.0  --   --   --   PROT 7.2 6.4*  --   --  6.0*  --   --   --   ALBUMIN 2.3* 1.9*   < > 3.2* 3.0* 2.8* 3.0* 2.8*   < > = values in this interval not displayed.    No results for input(s): "LIPASE", "AMYLASE" in the last 168 hours. No results for input(s): "AMMONIA" in the last 168 hours. Coagulation Profile: No results for input(s): "INR", "PROTIME" in the last 168 hours. Cardiac Enzymes: No results for input(s): "CKTOTAL", "CKMB", "CKMBINDEX", "TROPONINI" in the last 168 hours. BNP (last 3 results) No results for input(s): "PROBNP" in the last 8760 hours. HbA1C: No results for input(s): "HGBA1C" in the last 72 hours. CBG: No results for input(s): "GLUCAP" in the last 168 hours. Lipid Profile: No results for input(s): "CHOL", "HDL",  "LDLCALC", "TRIG", "CHOLHDL", "LDLDIRECT" in the last 72 hours. Thyroid Function Tests: No results for input(s): "TSH", "T4TOTAL", "FREET4", "T3FREE", "THYROIDAB" in the last 72 hours. Anemia Panel: No results for input(s): "VITAMINB12", "FOLATE", "FERRITIN", "TIBC", "IRON", "RETICCTPCT" in the last 72 hours. Urine analysis:    Component Value Date/Time   COLORURINE YELLOW 08/28/2022 1430   APPEARANCEUR HAZY (A) 08/28/2022 1430   LABSPEC 1.014 08/28/2022 1430   PHURINE 5.0 08/28/2022 1430   GLUCOSEU NEGATIVE 08/28/2022 1430   HGBUR MODERATE (A) 08/28/2022 1430   BILIRUBINUR NEGATIVE 08/28/2022 1430   BILIRUBINUR negative 06/03/2016 1542   BILIRUBINUR neg 07/21/2014 1622   KETONESUR NEGATIVE 08/28/2022 1430   PROTEINUR NEGATIVE 08/28/2022 1430   UROBILINOGEN 0.2 06/03/2016 1542   UROBILINOGEN 0.2 12/20/2015 1535   NITRITE NEGATIVE 08/28/2022 1430   LEUKOCYTESUR NEGATIVE 08/28/2022 1430   Sepsis Labs: @LABRCNTIP (procalcitonin:4,lacticidven:4)  ) Recent Results (from the past 240 hour(s))  Urine Culture     Status: Abnormal   Collection Time: 08/28/22  2:30 PM   Specimen: Urine, Clean Catch  Result Value Ref [place] Status   Specimen Description URINE, CLEAN CATCH  Final   Special Requests   Final    NONE Performed at [facility], [address] [place], [place]    Culture (A)  Final    >=100,000 COLONIES/mL ESCHERICHIA COLI Confirmed Extended Spectrum Beta-Lactamase Producer (ESBL).  In bloodstream infections from ESBL organisms, carbapenems are preferred over piperacillin/tazobactam. They are shown to have a lower risk of mortality.    Report Status 08/30/2022 FINAL  Final   Organism ID, Bacteria ESCHERICHIA COLI (A)  Final      Susceptibility   Escherichia coli - MIC*    AMPICILLIN >=32 RESISTANT Resistant     CEFAZOLIN >=64  RESISTANT Resistant     CEFEPIME >=32 RESISTANT Resistant     CEFTRIAXONE >=64 RESISTANT Resistant     CIPROFLOXACIN >=4  RESISTANT Resistant     GENTAMICIN <=1 SENSITIVE Sensitive     IMIPENEM 0.5 SENSITIVE Sensitive     NITROFURANTOIN 32 SENSITIVE Sensitive     TRIMETH/SULFA >=320 RESISTANT Resistant     AMPICILLIN/SULBACTAM >=32 RESISTANT Resistant     PIP/TAZO 16 SENSITIVE Sensitive     * >=100,000 COLONIES/mL ESCHERICHIA COLI  Culture, blood (Routine X 2) w Reflex to ID Panel     Status: None (Preliminary result)   Collection Time: 08/29/22  7:29 PM   Specimen: BLOOD RIGHT ARM  Result Value Ref [place] Status   Specimen Description BLOOD RIGHT ARM  Final   Special Requests   Final    BOTTLES DRAWN AEROBIC AND ANAEROBIC Blood Culture adequate volume   Culture   Final    NO GROWTH 2 DAYS Performed at [facility], [address] [place], [place] [address]    Report Status PENDING  Incomplete  Culture, blood (Routine X 2) w Reflex to ID Panel     Status: None (Preliminary result)   Collection Time: 08/29/22  7:30 PM   Specimen: BLOOD RIGHT ARM  Result Value Ref [place] Status   Specimen Description BLOOD RIGHT ARM  Final   Special Requests   Final    BOTTLES DRAWN AEROBIC AND ANAEROBIC Blood Culture adequate volume   Culture   Final    NO GROWTH 2 DAYS Performed at [facility], [address] [place], [place] [address]    Report Status PENDING  Incomplete          Radiology Studies: No results found.      Scheduled Meds:  apixaban  5 mg Oral BID   feeding supplement  237 mL Oral BID BM   ferrous sulfate  325 mg Oral QODAY   flecainide  50 mg Oral BID   free water  200 mL Oral Q4H   pantoprazole  40 mg Oral Daily   Continuous Infusions:  dextrose 5 % and 0.9 % NaCl with KCl 20 mEq/L 50 mL/hr at 08/30/22 0343   ertapenem 1,000 mg (08/31/22 1518)     LOS: 5 days    Time spent: 35 minutes.    Berton MountSylvester Annistyn Depass, MD  [facility] Pager #: [phone] 7PM-7AM contact night coverage as above

## 2022-09-01 DIAGNOSIS — E870 Hyperosmolality and hypernatremia: Secondary | ICD-10-CM | POA: Diagnosis not present

## 2022-09-01 LAB — RENAL FUNCTION PANEL
Albumin: 2.4 g/dL — ABNORMAL LOW (ref 3.5–5.0)
Albumin: 2.3 g/dL — ABNORMAL LOW (ref 3.5–5.0)
Albumin: 2.2 g/dL — ABNORMAL LOW (ref 3.5–5.0)
Anion gap: 9 (ref 5–15)
Anion gap: 7 (ref 5–15)
Anion gap: 5 (ref 5–15)
BUN: 6 mg/dL — ABNORMAL LOW (ref 8–23)
BUN: 8 mg/dL (ref 8–23)
BUN: 8 mg/dL (ref 8–23)
CO2: 22 mmol/L (ref 22–32)
CO2: 18 mmol/L — ABNORMAL LOW (ref 22–32)
CO2: 26 mmol/L (ref 22–32)
Calcium: 8 mg/dL — ABNORMAL LOW (ref 8.9–10.3)
Calcium: 8.3 mg/dL — ABNORMAL LOW (ref 8.9–10.3)
Calcium: 8 mg/dL — ABNORMAL LOW (ref 8.9–10.3)
Chloride: 106 mmol/L (ref 98–111)
Chloride: 114 mmol/L — ABNORMAL HIGH (ref 98–111)
Chloride: 104 mmol/L (ref 98–111)
Creatinine, Ser: 0.55 mg/dL (ref 0.44–1.00)
Creatinine, Ser: 0.55 mg/dL (ref 0.44–1.00)
Creatinine, Ser: 0.6 mg/dL (ref 0.44–1.00)
GFR, Estimated: >60 mL/min (ref >60)
GFR, Estimated: >60 mL/min (ref >60)
GFR, Estimated: >60 mL/min (ref >60)
Glucose, Bld: 120 mg/dL — ABNORMAL HIGH (ref 70–99)
Glucose, Bld: 84 mg/dL (ref 70–99)
Glucose, Bld: 94 mg/dL (ref 70–99)
Phosphorus: 2.5 mg/dL (ref 2.5–4.6)
Phosphorus: 3.1 mg/dL (ref 2.5–4.6)
Phosphorus: 3 mg/dL (ref 2.5–4.6)
Potassium: 3.8 mmol/L (ref 3.5–5.1)
Potassium: 4.9 mmol/L (ref 3.5–5.1)
Potassium: 3.7 mmol/L (ref 3.5–5.1)
Sodium: 137 mmol/L (ref 135–145)
Sodium: 139 mmol/L (ref 135–145)
Sodium: 135 mmol/L (ref 135–145)

## 2022-09-01 NOTE — Plan of Care (Signed)
  Problem: Education: Goal: Knowledge of General Education information will improve Description: Including pain rating scale, medication(s)/side effects and non-pharmacologic comfort measures Outcome: Not Progressing   Problem: Health Behavior/Discharge Planning: Goal: Ability to manage health-related needs will improve Outcome: Not Progressing   

## 2022-09-01 NOTE — Plan of Care (Signed)

## 2022-09-01 NOTE — Plan of Care (Signed)
  Problem: Education: Goal: Knowledge of General Education information will improve Description: Including pain rating scale, medication(s)/side effects and non-pharmacologic comfort measures 09/01/2022 2345 by Keturah Shavers, RN Outcome: Not Progressing 09/01/2022 2343 by Keturah Shavers, RN Outcome: Not Progressing   Problem: Health Behavior/Discharge Planning: Goal: Ability to manage health-related needs will improve 09/01/2022 2345 by Keturah Shavers, RN Outcome: Not Progressing 09/01/2022 2343 by Keturah Shavers, RN Outcome: Not Progressing

## 2022-09-01 NOTE — Progress Notes (Signed)
PROGRESS NOTE    Donna Simmons  HWE:[id number] DOB: [birth year] DOA: 08/25/2022 PCP: [name], DO  Outpatient Specialists:     Brief Narrative:  Patient is a 82 year old female with medical history significant for Alzheimer's dementia, hypertension, hyperlipidemia, permanent atrial fibrillation on Eliquis.  Patient was admitted with failure to thrive, hyponatremia (sodium of 166), and poor p.o. intake.  Workup done also revealed UTI secondary to E. coli ESBL.  Patient is currently on IV Ertapenem.  Complete course of antibiotics.  Assessment & Plan:   Principal Problem:   Hypernatremia Active Problems:   GERD (gastroesophageal reflux disease)   Leukocytosis   Thrombocytosis   Hypokalemia   Hypoalbuminemia due to protein-calorie malnutrition (HCC)   Transaminitis   AKI (acute kidney injury) ([place])   Permanent atrial fibrillation (HCC)   Iron deficiency anemia   Alzheimer's dementia ([place])   Insomnia   Failure to thrive (child)   Hypernatremia: -Likely volume dependent. -Resolved with hydration.   -Sodium is 135 today.   Leukocytosis possibly reactive -Workup revealed UTI secondary to E. coli ESBL. -Leukocytosis has resolved.  UTI secondary to E. coli ESBL: -Complete course of antibiotics.    -Blood cultures have not grown any organisms..   Thrombocytosis: -Possibly reactive -Resolved.     Hypokalemia -Resolved. -Potassium is 3.7.  Acute kidney injury: -Resolved.  Dehydration: -Continue to assess and hydrate as needed.  Transaminitis: -Etiology unclear. -Resolved. -Cannot rule out shock liver.   Hypoalbuminemia: -Possibly secondary to moderate protein calorie malnutrition Albumin 2.3, protein supplement will be provided -Low albumin may have prognostic significance. -Palliative care consult.   Iron deficiency anemia: Continue ferrous sulfate   Permanent atrial fibrillation: Continue flecainide and Eliquis   GERD: Continue Protonix    Insomnia: Continue melatonin nightly as needed   Alzheimer's dementia: Stable     DVT prophylaxis: Eliquis Code Status: DO NOT RESUSCITATE.   Family Communication:  Disposition Plan: This will depend on hospital course   Consultants:  None.  Will have a low threshold to consult the palliative care team.  Procedures:  None  Antimicrobials:  IV Rocephin discontinued. IV ertapenem started today, 08/30/2022.   Subjective: No significant history from patient.  Objective: Vitals:   08/31/22 2049 09/01/22 0400 09/01/22 0830 09/01/22 1529  BP: (!) 109/59 (!) 107/56 114/66 (!) 93/57  Pulse: 99 96 69 92  Resp:   16 17  Temp: 98.4 F (36.9 C) 98.1 F (36.7 C) 98 F (36.7 C) (!) 97.3 F (36.3 C)  TempSrc: Oral Oral Axillary Oral  SpO2: 95% 96% 94% 100%  Weight:      Height:       No intake or output data in the 24 hours ending 09/01/22 2013  Filed Weights   08/30/22 1300  Weight: 50.2 kg    Examination:  General exam: Not in any distress.   Respiratory system: Clear to auscultation.  Cardiovascular system: S1 & S2 heard Gastrointestinal system: Abdomen is soft and nontender.  Central nervous system: Still quietly. Extremities: No leg edema.  Data Reviewed: I have personally reviewed following labs and imaging studies  CBC: Recent Labs  Lab 08/26/22 0659 08/30/22 0215  WBC 10.6* 7.8  NEUTROABS  --  6.2  HGB 11.1* 10.2*  HCT 37.8 32.2*  MCV 84.6 79.5*  PLT 393 893    Basic Metabolic Panel: Recent Labs  Lab 08/26/22 0659 08/26/22 1631 08/27/22 1149 08/27/22 1857 08/28/22 0219 08/28/22 0955 08/30/22 0215 08/30/22 1055 08/30/22 1823 08/31/22 0200 09/01/22 8101  09/01/22 1005 09/01/22 1805  NA 157*   < > 146*   < > 146*   < > 143   < > 139 140 137 139 135  K 2.6*   < > 3.3*   < > 3.6   < > 3.6   < > 4.5 4.3 3.8 4.9 3.7  CL 120*   < > 114*   < > 115*   < > 112*   < > 107 108 106 114* 104  CO2 31   < > 26   < > 25   < > 23   < > 24 22 22  18*  26  GLUCOSE 130*   < > 113*   < > 98   < > 88   < > 79 93 120* 84 94  BUN 24*   < > 12   < > 8   < > <5*   < > <5* <5* 6* 8 8  CREATININE 0.83   < > 0.61   < > 0.58   < > 0.50   < > 0.55 0.48 0.55 0.55 0.60  CALCIUM 8.5*   < > 8.2*   < > 8.5*   < > 8.6*   < > 8.5* 8.3* 8.0* 8.3* 8.0*  MG 2.6*  --  2.3  --  2.1  --  1.9  --   --  2.0  --   --   --   PHOS 2.7  --  2.6   < > 2.6   < > 2.0*   < > 2.3* 2.5 2.5 3.1 3.0   < > = values in this interval not displayed.    GFR: Estimated Creatinine Clearance: 45.1 mL/min (by C-G formula based on SCr of 0.6 mg/dL). Liver Function Tests: Recent Labs  Lab 08/26/22 0659 08/27/22 1149 08/30/22 0215 08/30/22 1055 08/30/22 1823 08/31/22 0200 09/01/22 0212 09/01/22 1005 09/01/22 1805  AST 53*  --  24  --   --   --   --   --   --   ALT 74*  --  31  --   --   --   --   --   --   ALKPHOS 75  --  57  --   --   --   --   --   --   BILITOT 0.7  --  1.0  --   --   --   --   --   --   PROT 6.4*  --  6.0*  --   --   --   --   --   --   ALBUMIN 1.9*   < > 3.0*   < > 3.0* 2.8* 2.4* 2.3* 2.2*   < > = values in this interval not displayed.    No results for input(s): "LIPASE", "AMYLASE" in the last 168 hours. No results for input(s): "AMMONIA" in the last 168 hours. Coagulation Profile: No results for input(s): "INR", "PROTIME" in the last 168 hours. Cardiac Enzymes: No results for input(s): "CKTOTAL", "CKMB", "CKMBINDEX", "TROPONINI" in the last 168 hours. BNP (last 3 results) No results for input(s): "PROBNP" in the last 8760 hours. HbA1C: No results for input(s): "HGBA1C" in the last 72 hours. CBG: No results for input(s): "GLUCAP" in the last 168 hours. Lipid Profile: No results for input(s): "CHOL", "HDL", "LDLCALC", "TRIG", "CHOLHDL", "LDLDIRECT" in the last 72 hours. Thyroid Function Tests: No results for input(s): "TSH", "T4TOTAL", "FREET4", "  T3FREE", "THYROIDAB" in the last 72 hours. Anemia Panel: No results for input(s): "VITAMINB12",  "FOLATE", "FERRITIN", "TIBC", "IRON", "RETICCTPCT" in the last 72 hours. Urine analysis:    Component Value Date/Time   COLORURINE YELLOW 08/28/2022 1430   APPEARANCEUR HAZY (A) 08/28/2022 1430   LABSPEC 1.014 08/28/2022 1430   PHURINE 5.0 08/28/2022 1430   GLUCOSEU NEGATIVE 08/28/2022 1430   HGBUR MODERATE (A) 08/28/2022 1430   BILIRUBINUR NEGATIVE 08/28/2022 1430   BILIRUBINUR negative 06/03/2016 1542   BILIRUBINUR neg 07/21/2014 1622   KETONESUR NEGATIVE 08/28/2022 1430   PROTEINUR NEGATIVE 08/28/2022 1430   UROBILINOGEN 0.2 06/03/2016 1542   UROBILINOGEN 0.2 12/20/2015 1535   NITRITE NEGATIVE 08/28/2022 1430   LEUKOCYTESUR NEGATIVE 08/28/2022 1430   Sepsis Labs: @LABRCNTIP (procalcitonin:4,lacticidven:4)  ) Recent Results (from the past 240 hour(s))  Urine Culture     Status: Abnormal   Collection Time: 08/28/22  2:30 PM   Specimen: Urine, Clean Catch  Result Value Ref [place] Status   Specimen Description URINE, CLEAN CATCH  Final   Special Requests   Final    NONE Performed at [facility], [place] [address], [place], [place] [address]    Culture (A)  Final    >=100,000 COLONIES/mL ESCHERICHIA COLI Confirmed Extended Spectrum Beta-Lactamase Producer (ESBL).  In bloodstream infections from ESBL organisms, carbapenems are preferred over piperacillin/tazobactam. They are shown to have a lower risk of mortality.    Report Status 08/30/2022 FINAL  Final   Organism ID, Bacteria ESCHERICHIA COLI (A)  Final      Susceptibility   Escherichia coli - MIC*    AMPICILLIN >=32 RESISTANT Resistant     CEFAZOLIN >=64 RESISTANT Resistant     CEFEPIME >=32 RESISTANT Resistant     CEFTRIAXONE >=64 RESISTANT Resistant     CIPROFLOXACIN >=4 RESISTANT Resistant     GENTAMICIN <=1 SENSITIVE Sensitive     IMIPENEM 0.5 SENSITIVE Sensitive     NITROFURANTOIN 32 SENSITIVE Sensitive     TRIMETH/SULFA >=320 RESISTANT Resistant     AMPICILLIN/SULBACTAM >=32 RESISTANT Resistant      PIP/TAZO 16 SENSITIVE Sensitive     * >=100,000 COLONIES/mL ESCHERICHIA COLI  Culture, blood (Routine X 2) w Reflex to ID Panel     Status: None (Preliminary result)   Collection Time: 08/29/22  7:29 PM   Specimen: BLOOD RIGHT ARM  Result Value Ref [place] Status   Specimen Description BLOOD RIGHT ARM  Final   Special Requests   Final    BOTTLES DRAWN AEROBIC AND ANAEROBIC Blood Culture adequate volume   Culture   Final    NO GROWTH 3 DAYS Performed at [facility], [address], [place], [place] [address]    Report Status PENDING  Incomplete  Culture, blood (Routine X 2) w Reflex to ID Panel     Status: None (Preliminary result)   Collection Time: 08/29/22  7:30 PM   Specimen: BLOOD RIGHT ARM  Result Value Ref [place] Status   Specimen Description BLOOD RIGHT ARM  Final   Special Requests   Final    BOTTLES DRAWN AEROBIC AND ANAEROBIC Blood Culture adequate volume   Culture   Final    NO GROWTH 3 DAYS Performed at [facility], [place] [address] [place], [place] [address]    Report Status PENDING  Incomplete          Radiology Studies: No results found.      Scheduled Meds:  apixaban  5 mg Oral  BID   feeding supplement  237 mL Oral BID BM   ferrous sulfate  325 mg Oral QODAY   flecainide  50 mg Oral BID   free water  200 mL Oral Q4H   pantoprazole  40 mg Oral Daily   Continuous Infusions:  dextrose 5 % and 0.9 % NaCl with KCl 20 mEq/L 50 mL/hr at 09/01/22 1049   ertapenem Stopped (09/01/22 1230)     LOS: 6 days    Time spent: 35 minutes.    Berton Mount, MD  [facility] Pager #: [phone] 7PM-7AM contact night coverage as above

## 2022-09-02 DIAGNOSIS — E870 Hyperosmolality and hypernatremia: Secondary | ICD-10-CM | POA: Diagnosis not present

## 2022-09-02 LAB — RENAL FUNCTION PANEL
Albumin: 2.2 g/dL — ABNORMAL LOW (ref 3.5–5.0)
Albumin: 2.4 g/dL — ABNORMAL LOW (ref 3.5–5.0)
Anion gap: 8 (ref 5–15)
Anion gap: 7 (ref 5–15)
BUN: 7 mg/dL — ABNORMAL LOW (ref 8–23)
BUN: 5 mg/dL — ABNORMAL LOW (ref 8–23)
CO2: 26 mmol/L (ref 22–32)
CO2: 24 mmol/L (ref 22–32)
Calcium: 8.2 mg/dL — ABNORMAL LOW (ref 8.9–10.3)
Calcium: 8.4 mg/dL — ABNORMAL LOW (ref 8.9–10.3)
Chloride: 106 mmol/L (ref 98–111)
Chloride: 106 mmol/L (ref 98–111)
Creatinine, Ser: 0.5 mg/dL (ref 0.44–1.00)
Creatinine, Ser: 0.46 mg/dL (ref 0.44–1.00)
GFR, Estimated: >60 mL/min (ref >60)
GFR, Estimated: >60 mL/min (ref >60)
Glucose, Bld: 95 mg/dL (ref 70–99)
Glucose, Bld: 105 mg/dL — ABNORMAL HIGH (ref 70–99)
Phosphorus: 3 mg/dL (ref 2.5–4.6)
Phosphorus: 3.1 mg/dL (ref 2.5–4.6)
Potassium: 3.7 mmol/L (ref 3.5–5.1)
Potassium: 3.8 mmol/L (ref 3.5–5.1)
Sodium: 140 mmol/L (ref 135–145)
Sodium: 137 mmol/L (ref 135–145)

## 2022-09-02 MED ORDER — ENSURE ENLIVE PO LIQD
237.0000 mL | Freq: Three times a day (TID) | ORAL | Status: DC
  Administered 2022-09-02 – 2022-12-19 (×266): 237 mL via ORAL

## 2022-09-02 MED ORDER — SODIUM CHLORIDE 0.9 % IV SOLN
1.0000 g | INTRAVENOUS | Status: AC
  Administered 2022-09-03 – 2022-09-04 (×2): 1000 mg via INTRAVENOUS
  Filled 2022-09-02 (×2): qty 1

## 2022-09-02 NOTE — Progress Notes (Signed)
PROGRESS NOTE    Donna Simmons  ZOX:[id number] DOB: Dec 01, 1940 DOA: 08/25/2022 PCP: Yolanda Manges, DO   Brief Narrative: 82 year old with past medical history significant for Alzheimer's dementia, hypertension, hyperlipidemia, permanent A-fib on Eliquis admitted with failure to thrive, hypernatremia with a sodium of 166, poor oral intake.  Workup revealed UTI ESBL.  Patient treated with IV Ertapenem.   Currently awaiting placement.    Assessment & Plan:   Principal Problem:   Hypernatremia Active Problems:   GERD (gastroesophageal reflux disease)   Leukocytosis   Thrombocytosis   Hypokalemia   Hypoalbuminemia due to protein-calorie malnutrition (HCC)   Transaminitis   AKI (acute kidney injury) (HCC)   Permanent atrial fibrillation (HCC)   Iron deficiency anemia   Alzheimer's dementia (HCC)   Insomnia   Failure to thrive (child)   1-Hypernatremia: Presented  with a sodium level of 167 In the setting of poor oral intake and hypovolemia. Resolved with IV fluids  2-Leukocytosis In the setting of UTI.  Resolved.   3-UTI, E. coli ESBL ; UA; 11-20 WBC.  Urine culture: E coli ESBL.  On Ertapenem  Plan to complete 5 days.   4-Thrombocytosis In the setting of infection.  Resolved  Hypokalemia: Replaced.  AKI: Present with a creatinine of 1.1.  Baseline 0.7 In the setting of hypovolemia Resolved IV fluids  Dehydration; treated with IV fluids.   Transaminases Resolved. Related to hypovolemia  Hypoalbuminemia; In the setting  of poor oral intake Continue with Ensure   Iron deficiency anemia Hemoglobin was stable last time was checked  Permanent A-fib Continue with Eliquis Metoprolol on hold due to soft blood pressure On flecainide.  Check Mg level.   GERD: PPI.   Insomnia: Continue with melatonin  Alzheimer's dementia: De,irium precaution.     Pressure Injury 07/01/22 Coccyx Right;Left Stage 2 -  Partial thickness loss of dermis  presenting as a shallow open injury with a red, pink wound bed without slough. (Active)  07/01/22 1405  Location: Coccyx  Location Orientation: Right;Left  Staging: Stage 2 -  Partial thickness loss of dermis presenting as a shallow open injury with a red, pink wound bed without slough.  Wound Description (Comments):   Present on Admission: Yes      Estimated body mass index is 20.24 kg/m as calculated from the following:   Height as of this encounter: 5\' 2"  (1.575 m).   Weight as of this encounter: 50.2 kg.   DVT prophylaxis:  Code Status: DNR, Confirmed with Husband in event of respiratory or cardiac arrest, do not resuscitate or intubate. In event of deterioration ok to IV fluids, antibiotics, medications, but no intubation.  Family Communication: Husband over phone 1/02 Disposition Plan:  Status is: Inpatient Remains inpatient appropriate because: awaiting placement.     Consultants:  none  Procedures:  None  Antimicrobials:    Subjective: She is lying in bed in fetal position, appears generalized weak.  Per nurse report poor oral intake.  Per husband she eats a little bit more with him, but  it takes time.  She denies pain. She had BM today , documented.   Objective: Vitals:   09/01/22 1529 09/01/22 2039 09/02/22 0440 09/02/22 0734  BP: (!) 93/57 (!) 87/73 (!) 101/56 106/62  Pulse: 92 65 82 81  Resp: 17 19 17 18   Temp: (!) 97.3 F (36.3 C) 99.6 F (37.6 C) 97.9 F (36.6 C) 98 F (36.7 C)  TempSrc: Oral Oral Oral Oral  SpO2: 100% 100%  100% 100%  Weight:      Height:        Intake/Output Summary (Last 24 hours) at 09/02/2022 0734 Last data filed at 09/02/2022 0500 Gross per 24 hour  Intake 0 ml  Output 150 ml  Net -150 ml   Filed Weights   08/30/22 1300  Weight: 50.2 kg    Examination:  General exam: Appears calm and comfortable , thin appearing.  Respiratory system: Clear to auscultation. Respiratory effort normal. Cardiovascular system: S1 & S2  heard, RRR.  Gastrointestinal system: Abdomen is nondistended, soft and nontender. No organomegaly or masses felt. Normal bowel sounds heard. Central nervous system: Alert  Extremities: no edema  Data Reviewed: I have personally reviewed following labs and imaging studies  CBC: Recent Labs  Lab 08/30/22 0215  WBC 7.8  NEUTROABS 6.2  HGB 10.2*  HCT 32.2*  MCV 79.5*  PLT 350   Basic Metabolic Panel: Recent Labs  Lab 08/27/22 1149 08/27/22 1857 08/28/22 0219 08/28/22 0955 08/30/22 0215 08/30/22 1055 08/31/22 0200 09/01/22 0212 09/01/22 1005 09/01/22 1805 09/02/22 0246  NA 146*   < > 146*   < > 143   < > 140 137 139 135 140  K 3.3*   < > 3.6   < > 3.6   < > 4.3 3.8 4.9 3.7 3.7  CL 114*   < > 115*   < > 112*   < > 108 106 114* 104 106  CO2 26   < > 25   < > 23   < > 22 22 18* 26 26  GLUCOSE 113*   < > 98   < > 88   < > 93 120* 84 94 95  BUN 12   < > 8   < > <5*   < > <5* 6* 8 8 7*  CREATININE 0.61   < > 0.58   < > 0.50   < > 0.48 0.55 0.55 0.60 0.50  CALCIUM 8.2*   < > 8.5*   < > 8.6*   < > 8.3* 8.0* 8.3* 8.0* 8.2*  MG 2.3  --  2.1  --  1.9  --  2.0  --   --   --   --   PHOS 2.6   < > 2.6   < > 2.0*   < > 2.5 2.5 3.1 3.0 3.0   < > = values in this interval not displayed.   GFR: Estimated Creatinine Clearance: 45.1 mL/min (by C-G formula based on SCr of 0.5 mg/dL). Liver Function Tests: Recent Labs  Lab 08/30/22 0215 08/30/22 1055 08/31/22 0200 09/01/22 0212 09/01/22 1005 09/01/22 1805 09/02/22 0246  AST 24  --   --   --   --   --   --   ALT 31  --   --   --   --   --   --   ALKPHOS 57  --   --   --   --   --   --   BILITOT 1.0  --   --   --   --   --   --   PROT 6.0*  --   --   --   --   --   --   ALBUMIN 3.0*   < > 2.8* 2.4* 2.3* 2.2* 2.2*   < > = values in this interval not displayed.   No results for input(s): "LIPASE", "AMYLASE" in the last 168 hours.  No results for input(s): "AMMONIA" in the last 168 hours. Coagulation Profile: No results for  input(s): "INR", "PROTIME" in the last 168 hours. Cardiac Enzymes: No results for input(s): "CKTOTAL", "CKMB", "CKMBINDEX", "TROPONINI" in the last 168 hours. BNP (last 3 results) No results for input(s): "PROBNP" in the last 8760 hours. HbA1C: No results for input(s): "HGBA1C" in the last 72 hours. CBG: No results for input(s): "GLUCAP" in the last 168 hours. Lipid Profile: No results for input(s): "CHOL", "HDL", "LDLCALC", "TRIG", "CHOLHDL", "LDLDIRECT" in the last 72 hours. Thyroid Function Tests: No results for input(s): "TSH", "T4TOTAL", "FREET4", "T3FREE", "THYROIDAB" in the last 72 hours. Anemia Panel: No results for input(s): "VITAMINB12", "FOLATE", "FERRITIN", "TIBC", "IRON", "RETICCTPCT" in the last 72 hours. Sepsis Labs: No results for input(s): "PROCALCITON", "LATICACIDVEN" in the last 168 hours.  Recent Results (from the past 240 hour(s))  Urine Culture     Status: Abnormal   Collection Time: 08/28/22  2:30 PM   Specimen: Urine, Clean Catch  Result Value Ref Range Status   Specimen Description URINE, CLEAN CATCH  Final   Special Requests   Final    NONE Performed at [facility], [address] [place], [place] [address]    Culture (A)  Final    >=100,000 COLONIES/mL ESCHERICHIA COLI Confirmed Extended Spectrum Beta-Lactamase Producer (ESBL).  In bloodstream infections from ESBL organisms, carbapenems are preferred over piperacillin/tazobactam. They are shown to have a lower risk of mortality.    Report Status 08/30/2022 FINAL  Final   Organism ID, Bacteria ESCHERICHIA COLI (A)  Final      Susceptibility   Escherichia coli - MIC*    AMPICILLIN >=32 RESISTANT Resistant     CEFAZOLIN >=64 RESISTANT Resistant     CEFEPIME >=32 RESISTANT Resistant     CEFTRIAXONE >=64 RESISTANT Resistant     CIPROFLOXACIN >=4 RESISTANT Resistant     GENTAMICIN <=1 SENSITIVE Sensitive     IMIPENEM 0.5 SENSITIVE Sensitive     NITROFURANTOIN 32 SENSITIVE Sensitive      TRIMETH/SULFA >=320 RESISTANT Resistant     AMPICILLIN/SULBACTAM >=32 RESISTANT Resistant     PIP/TAZO 16 SENSITIVE Sensitive     * >=100,000 COLONIES/mL ESCHERICHIA COLI  Culture, blood (Routine X 2) w Reflex to ID Panel     Status: None (Preliminary result)   Collection Time: 08/29/22  7:29 PM   Specimen: BLOOD RIGHT ARM  Result Value Ref Range Status   Specimen Description BLOOD RIGHT ARM  Final   Special Requests   Final    BOTTLES DRAWN AEROBIC AND ANAEROBIC Blood Culture adequate volume   Culture   Final    NO GROWTH 3 DAYS Performed at [facility], [address], [place], [place] [address]    Report Status PENDING  Incomplete  Culture, blood (Routine X 2) w Reflex to ID Panel     Status: None (Preliminary result)   Collection Time: 08/29/22  7:30 PM   Specimen: BLOOD RIGHT ARM  Result Value Ref Range Status   Specimen Description BLOOD RIGHT ARM  Final   Special Requests   Final    BOTTLES DRAWN AEROBIC AND ANAEROBIC Blood Culture adequate volume   Culture   Final    NO GROWTH 3 DAYS Performed at [facility], [place] [address] [place], [place] [address]    Report Status PENDING  Incomplete         Radiology Studies: No results found.      Scheduled  Meds:  apixaban  5 mg Oral BID   feeding supplement  237 mL Oral BID BM   ferrous sulfate  325 mg Oral QODAY   flecainide  50 mg Oral BID   free water  200 mL Oral Q4H   pantoprazole  40 mg Oral Daily   Continuous Infusions:  dextrose 5 % and 0.9 % NaCl with KCl 20 mEq/L 50 mL/hr at 09/01/22 1049   ertapenem Stopped (09/01/22 1230)     LOS: 7 days    Time spent: 35 minutes    Rakel Junio A Jazziel Fitzsimmons, MD [facility]   If 7PM-7AM, please contact night-coverage [email or web address]  09/02/2022, 7:34 AM

## 2022-09-02 NOTE — TOC Progression Note (Signed)
Transition of Care [facility]) - Progression Note    Patient Details  Name: Donna Simmons MRN: [id number] Date of Birth: 11/19/1940  [facility] of [facility]) CM/SW Contact  Coralee Pesa, [place] Phone Number: 09/02/2022, 2:11 PM  Clinical Narrative:     TOC continues to follow for DC planning. CSW noting a consult for Palliative care, TOC to follow for appropriate disposition. Significant barriers at this time for SNF placement, will continue to assist with placement unless goals change. TOC will continue to follow for DC needs.        Expected Discharge Plan and Services                                               Social Determinants of Health (SDOH) Interventions SDOH Screenings   Food Insecurity: Unknown (07/03/2022)  Alcohol Screen: Low Risk  (04/12/2019)  Depression (PHQ2-9): Low Risk  (11/05/2020)  Tobacco Use: Low Risk  (08/20/2022)    Readmission Risk Interventions     No data to display

## 2022-09-03 DIAGNOSIS — R6251 Failure to thrive (child): Secondary | ICD-10-CM | POA: Diagnosis not present

## 2022-09-03 DIAGNOSIS — E870 Hyperosmolality and hypernatremia: Secondary | ICD-10-CM | POA: Diagnosis not present

## 2022-09-03 DIAGNOSIS — G309 Alzheimer's disease, unspecified: Secondary | ICD-10-CM | POA: Diagnosis not present

## 2022-09-03 DIAGNOSIS — F02C0 Dementia in other diseases classified elsewhere, severe, without behavioral disturbance, psychotic disturbance, mood disturbance, and anxiety: Secondary | ICD-10-CM

## 2022-09-03 DIAGNOSIS — Z515 Encounter for palliative care: Secondary | ICD-10-CM

## 2022-09-03 DIAGNOSIS — Z7189 Other specified counseling: Secondary | ICD-10-CM

## 2022-09-03 DIAGNOSIS — E8809 Other disorders of plasma-protein metabolism, not elsewhere classified: Secondary | ICD-10-CM | POA: Diagnosis not present

## 2022-09-03 LAB — BASIC METABOLIC PANEL
Anion gap: 9 (ref 5–15)
BUN: 5 mg/dL — ABNORMAL LOW (ref 8–23)
CO2: 23 mmol/L (ref 22–32)
Calcium: 8.3 mg/dL — ABNORMAL LOW (ref 8.9–10.3)
Chloride: 104 mmol/L (ref 98–111)
Creatinine, Ser: 0.43 mg/dL — ABNORMAL LOW (ref 0.44–1.00)
GFR, Estimated: >60 mL/min (ref >60)
Glucose, Bld: 94 mg/dL (ref 70–99)
Potassium: 3.5 mmol/L (ref 3.5–5.1)
Sodium: 136 mmol/L (ref 135–145)

## 2022-09-03 LAB — CULTURE, BLOOD (ROUTINE X 2)
Culture: NO GROWTH
Culture: NO GROWTH
Special Requests: ADEQUATE
Special Requests: ADEQUATE

## 2022-09-03 LAB — GLUCOSE, CAPILLARY: Glucose-Capillary: 72 mg/dL (ref 70–99)

## 2022-09-03 LAB — MAGNESIUM: Magnesium: 1.8 mg/dL (ref 1.7–2.4)

## 2022-09-03 MED ORDER — POTASSIUM CHLORIDE 20 MEQ PO PACK
40.0000 meq | PACK | Freq: Once | ORAL | Status: AC
  Administered 2022-09-03: 40 meq via ORAL
  Filled 2022-09-03: qty 2

## 2022-09-03 NOTE — Progress Notes (Signed)
PROGRESS NOTE  Donna Simmons [id number] DOB: 1941/03/08 DOA: 08/25/2022 PCP: [name], DO   LOS: 8 days   Brief Narrative / Interim history: 82 year old with past medical history significant for Alzheimer's dementia, hypertension, hyperlipidemia, permanent A-fib on Eliquis admitted with failure to thrive, hypernatremia with a sodium of 166, poor oral intake.  Workup revealed UTI ESBL.  Patient treated with IV Ertapenem.  Currently awaiting placement.   Subjective / 24h Interval events: Doing well, not interacting much   Assesement and Plan: Principal Problem:   Hypernatremia Active Problems:   GERD (gastroesophageal reflux disease)   Leukocytosis   Thrombocytosis   Hypokalemia   Hypoalbuminemia due to protein-calorie malnutrition (HCC)   Transaminitis   AKI (acute kidney injury) ([place])   Permanent atrial fibrillation (HCC)   Iron deficiency anemia   Alzheimer's dementia ([place])   Insomnia   Failure to thrive (child)   Principal problem Hypernatremia - Presented  with a sodium level of 167, likely in the setting of poor oral intake and hypovolemia. Resolved with IV fluids, sodium now normalized and is 136 this morning.  Hold further fluids, allow p.o. intake and see how she does.  Hyponatremia confers a very poor prognosis in the setting of dementia, palliative care consulted  Active problems UTI, E. coli ESBL -patient was diagnosed with a ESBL E. coli, speciated 08/30/2022.  She was started on ertapenem on 12/30, today's day #5 and completed the treatment.  Thrombocytosis -In the setting of infection.  Resolved   Hypokalemia -Replaced.   AKI -Present with a creatinine of 1.1.  Baseline 0.7, in the setting of hypovolemia, resolved IV fluids   Transaminases -Resolved.   Hypoalbuminemia - In the setting  of poor oral intake   Iron deficiency anemia - Hemoglobin was stable last time was checked   Permanent A-fib -Continue with Eliquis, continue flecainide,  continue to hold metoprolol in the setting of soft blood pressures   Insomnia -Continue with melatonin   Alzheimer's dementia - Delirium precaution.   Scheduled Meds:  apixaban  5 mg Oral BID   feeding supplement  237 mL Oral TID BM   ferrous sulfate  325 mg Oral QODAY   flecainide  50 mg Oral BID   pantoprazole  40 mg Oral Daily   Continuous Infusions:  ertapenem 1,000 mg (09/03/22 0948)   PRN Meds:.acetaminophen **OR** acetaminophen, melatonin, ondansetron **OR** ondansetron (ZOFRAN) IV  Current Outpatient Medications  Medication Instructions   acetaminophen (TYLENOL) 650 mg, Oral, Every 6 hours PRN   apixaban (ELIQUIS) 5 MG TABS tablet Take 1 tablet by mouth twice daily   Cholecalciferol (VITAMIN D-3 PO) 1 capsule, Oral, Daily   Ensure (ENSURE) 237 mLs, Oral, 2 times daily, Vanilla   flecainide (TAMBOCOR) 50 MG tablet TAKE 1 TABLET BY MOUTH TWICE DAILY. APPOINTMENT REQUIRED FOR FUTURE REFILLS   melatonin 5 mg, Oral, At bedtime PRN   metoprolol succinate (TOPROL-XL) 12.5 mg, Oral, Daily   omeprazole (PRILOSEC) 40 mg, Oral, Daily   polyethylene glycol (MIRALAX / GLYCOLAX) 17 g, Oral, Daily PRN   traZODone (DESYREL) 50 mg, Oral, Daily at bedtime    Diet Orders (From admission, onward)     Start     Ordered   08/29/22 1653  DIET DYS 3 Room service appropriate? Yes; Fluid consistency: Thin  Diet effective now       Question Answer Comment  Room service appropriate? Yes   Fluid consistency: Thin      08/29/22 1652  DVT prophylaxis: SCDs Start: 08/25/22 0659 apixaban (ELIQUIS) tablet 5 mg   Lab Results  Component Value Date   PLT 261 08/30/2022      Code Status: DNR  Family Communication: no family at bedside  Status is: Inpatient  Remains inpatient appropriate because: severity of illness  Level of care: Telemetry Medical  Consultants:  Palliative  Objective: Vitals:   09/02/22 1450 09/02/22 2002 09/03/22 0604 09/03/22 0747  BP: (!) 89/55  111/63 119/77 (!) 144/81  Pulse: 84 80 78 93  Resp: 16 16 16 16   Temp: 99 F (37.2 C) 98.3 F (36.8 C) 98 F (36.7 C) 98.4 F (36.9 C)  TempSrc: Oral Oral Axillary Oral  SpO2: 99% 100% 100% 100%  Weight:      Height:        Intake/Output Summary (Last 24 hours) at 09/03/2022 1349 Last data filed at 09/03/2022 7846 Gross per 24 hour  Intake --  Output 500 ml  Net -500 ml   Wt Readings from Last 3 Encounters:  08/30/22 50.2 kg  07/03/22 50.2 kg  04/24/22 54.4 kg    Examination:  Constitutional: NAD Eyes: no scleral icterus ENMT: Mucous membranes are moist.  Neck: normal, supple Respiratory: clear to auscultation bilaterally, no wheezing, no crackles. Normal respiratory effort. No accessory muscle use.  Cardiovascular: Regular rate and rhythm, no murmurs / rubs / gallops.  Abdomen: non distended, no tenderness. Bowel sounds positive.  Musculoskeletal: no clubbing / cyanosis.   Data Reviewed: I have independently reviewed following labs and imaging studies   CBC Recent Labs  Lab 08/30/22 0215  WBC 7.8  HGB 10.2*  HCT 32.2*  PLT 261  MCV 79.5*  MCH 25.2*  MCHC 31.7  RDW 15.6*  LYMPHSABS 1.1  MONOABS 0.4  EOSABS 0.0  BASOSABS 0.0    Recent Labs  Lab 08/28/22 0219 08/28/22 0955 08/30/22 0215 08/30/22 1055 08/31/22 0200 09/01/22 0212 09/01/22 1005 09/01/22 1805 09/02/22 0246 09/02/22 1059 09/03/22 0226  NA 146*   < > 143   < > 140 137 139 135 140 137 136  K 3.6   < > 3.6   < > 4.3 3.8 4.9 3.7 3.7 3.8 3.5  CL 115*   < > 112*   < > 108 106 114* 104 106 106 104  CO2 25   < > 23   < > 22 22 18* 26 26 24 23   GLUCOSE 98   < > 88   < > 93 120* 84 94 95 105* 94  BUN 8   < > <5*   < > <5* 6* 8 8 7* 5* 5*  CREATININE 0.58   < > 0.50   < > 0.48 0.55 0.55 0.60 0.50 0.46 0.43*  CALCIUM 8.5*   < > 8.6*   < > 8.3* 8.0* 8.3* 8.0* 8.2* 8.4* 8.3*  AST  --   --  24  --   --   --   --   --   --   --   --   ALT  --   --  31  --   --   --   --   --   --   --   --    ALKPHOS  --   --  57  --   --   --   --   --   --   --   --   BILITOT  --   --  1.0  --   --   --   --   --   --   --   --  ALBUMIN 2.6*   < > 3.0*   < > 2.8* 2.4* 2.3* 2.2* 2.2* 2.4*  --   MG 2.1  --  1.9  --  2.0  --   --   --   --   --  1.8   < > = values in this interval not displayed.    ------------------------------------------------------------------------------------------------------------------ No results for input(s): "CHOL", "HDL", "LDLCALC", "TRIG", "CHOLHDL", "LDLDIRECT" in the last 72 hours.  Lab Results  Component Value Date   HGBA1C 5.3 04/04/2020   ------------------------------------------------------------------------------------------------------------------ No results for input(s): "TSH", "T4TOTAL", "T3FREE", "THYROIDAB" in the last 72 hours.  Invalid input(s): "FREET3"  Cardiac Enzymes No results for input(s): "CKMB", "TROPONINI", "MYOGLOBIN" in the last 168 hours.  Invalid input(s): "CK" ------------------------------------------------------------------------------------------------------------------    Component Value Date/Time   BNP 73.3 10/09/2019 1035    CBG: No results for input(s): "GLUCAP" in the last 168 hours.  Recent Results (from the past 240 hour(s))  Urine Culture     Status: Abnormal   Collection Time: 08/28/22  2:30 PM   Specimen: Urine, Clean Catch  Result Value Ref Range Status   Specimen Description URINE, CLEAN CATCH  Final   Special Requests   Final    NONE Performed at [facility], [address], [place], [place] [address]    Culture (A)  Final    >=100,000 COLONIES/mL ESCHERICHIA COLI Confirmed Extended Spectrum Beta-Lactamase Producer (ESBL).  In bloodstream infections from ESBL organisms, carbapenems are preferred over piperacillin/tazobactam. They are shown to have a lower risk of mortality.    Report Status 08/30/2022 FINAL  Final   Organism ID, Bacteria ESCHERICHIA COLI (A)  Final      Susceptibility    Escherichia coli - MIC*    AMPICILLIN >=32 RESISTANT Resistant     CEFAZOLIN >=64 RESISTANT Resistant     CEFEPIME >=32 RESISTANT Resistant     CEFTRIAXONE >=64 RESISTANT Resistant     CIPROFLOXACIN >=4 RESISTANT Resistant     GENTAMICIN <=1 SENSITIVE Sensitive     IMIPENEM 0.5 SENSITIVE Sensitive     NITROFURANTOIN 32 SENSITIVE Sensitive     TRIMETH/SULFA >=320 RESISTANT Resistant     AMPICILLIN/SULBACTAM >=32 RESISTANT Resistant     PIP/TAZO 16 SENSITIVE Sensitive     * >=100,000 COLONIES/mL ESCHERICHIA COLI  Culture, blood (Routine X 2) w Reflex to ID Panel     Status: None   Collection Time: 08/29/22  7:29 PM   Specimen: BLOOD RIGHT ARM  Result Value Ref Range Status   Specimen Description BLOOD RIGHT ARM  Final   Special Requests   Final    BOTTLES DRAWN AEROBIC AND ANAEROBIC Blood Culture adequate volume   Culture   Final    NO GROWTH 5 DAYS Performed at [facility], [address], [place], [place] [address]    Report Status 09/03/2022 FINAL  Final  Culture, blood (Routine X 2) w Reflex to ID Panel     Status: None   Collection Time: 08/29/22  7:30 PM   Specimen: BLOOD RIGHT ARM  Result Value Ref Range Status   Specimen Description BLOOD RIGHT ARM  Final   Special Requests   Final    BOTTLES DRAWN AEROBIC AND ANAEROBIC Blood Culture adequate volume   Culture   Final    NO GROWTH 5 DAYS Performed at [facility], [place] [address], [place], [place] [address]    Report Status 09/03/2022 FINAL  Final     Radiology  Studies: No results found.   Marzetta Board, MD, PhD [facility]  Between 7 am - 7 pm I am available, please contact me via Amion (for emergencies) or Securechat (non urgent messages)  Between 7 pm - 7 am I am not available, please contact night coverage MD/APP via Amion

## 2022-09-03 NOTE — Consult Note (Signed)
Palliative Care Consult Note                                  Date: 09/03/2022   Patient Name: Donna Simmons  DOB: 04/09/1941  MRN: [id number]  Age / Sex: 82 y.o., female  PCP: [name], DO Referring Physician: Caren Griffins, MD  Reason for Consultation: Establishing goals of care  HPI/Patient Profile: 81 y.o. female  with past medical history of Alzheimer's dementia, A-fib on Eliquis, hypertension, and hyperlipidemia.  She presented to [facility] ED on 08/25/2022 from SNF with decreased PO intake and lethargy.  Sodium on admission was 166.  She was admitted to [facility] service with hypernatremia, dehydration, and failure to thrive. Additional problems include leukocytosis, thrombocytosis, AKI, hypoalbuminemia, and transaminitis. Palliative Medicine has been consulted for goals of care   Past Medical History:  Diagnosis Date   Allergy    GERD (gastroesophageal reflux disease)    Hypercholesteremia    Hypertension    Memory loss    Paroxysmal atrial fibrillation ([place]) 12/27/2008    Subjective:   I have reviewed medical records including progress notes, labs and imaging, and assessed the patient at bedside.  She is curled up on her side, lying in bed.  She is not interactive and her speech is mostly mumbled, except for the word "ok".  RN reports that she ate 25% of her breakfast.  I spoke with her husband Gwynn Burly (by phone) to discuss diagnosis, prognosis, GOC, EOL wishes, disposition, and options.  Patient is known to PMT from previous hospitalizations.  I re-introduced Palliative Medicine as specialized medical care for people living with serious illness. It focuses on providing relief from the symptoms and stress of a serious illness.   A brief life review was discussed.  Valynn and Gwynn Burly have married for 47 years.  They do not have children.  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   We discussed Jaycee's course over the past 2 months.  On 06/29/2022, she fell and has not been ambulatory since then.  She was hospitalized with failure to thrive at [facility] 06/30/2022 through 07/08/2022, then discharged to [facility]. Thurmond reports that she has not improved while at [facility].  We discussed patient's current illness and what it means in the larger context of her ongoing co-morbidities.   Detailed discussion was had regarding the diagnosis of dementia and its natural trajectory. We reviewed that dementia is a progressive, non-curable disease. We reviewed specific indicators that he has reached advanced stages, including bed bound/non-ambulatory status, decreased ability to communicate, and decreased oral intake  Discussed ongoing risk for poor PO intake, malnutrition, and dehydration.  Discussed the ongoing risk for readmission. Discussed that artificial feeding is not recommended in patients with advanced dementia.  I introduced the concept of comfort care, which means allowing a natural course to occur rather than pursuing life-prolonging inventions. Encouraged Thurmond to consider at what point he would want to transition to comfort care, keeping in mind the concept of quality of life.  Briefly introduced hospice philosophy and services.  Values and goals of care important to patient and family were discussed.  Gwynn Burly is appreciation for the information and conversation.  He states that he is "not ready to live  without her".  For now, he wishes to continue supportive care with the goal of medical optimization and return to SNF.  He reports he is no longer able to care for [facility] at home.  Questions and concerns addressed. Husband encouraged to call with questions or concerns.     Review of Systems  Unable to perform ROS: Dementia    Objective:    Primary Diagnoses: Present on Admission:  Hypernatremia  GERD (gastroesophageal reflux disease)  Failure to thrive (child)   Physical Exam Vitals reviewed.  Constitutional:      General: She is awake.     Comments: Frail and chronically ill-appearing  Pulmonary:     Effort: Pulmonary effort is normal.  Neurological:     Comments: Garbled speech  Psychiatric:        Cognition and Memory: Cognition is impaired.     Vital Signs:  BP (!) 144/81 (BP Location: Right Arm)   Pulse 93   Temp 98.4 F (36.9 C) (Oral)   Resp 16   Ht 5\' 2"  (1.575 m)   Wt 50.2 kg   SpO2 100%   BMI 20.24 kg/m   Palliative Assessment/Data: PPS 20%     Assessment & Plan:   SUMMARY OF RECOMMENDATIONS   DNR as previously documented Continue current supportive interventions Husband will need ongoing education about the trajectory of dementia Goal of care - medical optimization and return to SNF Recommend outpatient palliative at discharge   Primary Decision Maker: Spouse - Jasmine Awe  Prognosis:  Difficult to determine, but less than 6 months would not be surprising  Discharge Planning:  SNF with palliative services   Discussed with: Dr. Cruzita Lederer and [facility] via secure chat   Thank you for allowing [place] to participate in the care of [place]  Signed by: Elie Confer, NP Palliative Medicine Team  Team Phone # [phone]  For individual providers, please see AMION

## 2022-09-03 NOTE — Plan of Care (Signed)
Was able to get patient to take several small bites of food.  I alternated food with meds crushed in applesauce.  She drank 1/2 of her mild.  She answered appropriately.  Edythe Clarity, RN

## 2022-09-04 DIAGNOSIS — E870 Hyperosmolality and hypernatremia: Secondary | ICD-10-CM | POA: Diagnosis not present

## 2022-09-04 LAB — COMPREHENSIVE METABOLIC PANEL
ALT: 21 U/L (ref 0–44)
AST: 22 U/L (ref 15–41)
Albumin: 2.2 g/dL — ABNORMAL LOW (ref 3.5–5.0)
Alkaline Phosphatase: 67 U/L (ref 38–126)
Anion gap: 10 (ref 5–15)
BUN: <5 mg/dL — ABNORMAL LOW (ref 8–23)
CO2: 27 mmol/L (ref 22–32)
Calcium: 8.4 mg/dL — ABNORMAL LOW (ref 8.9–10.3)
Chloride: 101 mmol/L (ref 98–111)
Creatinine, Ser: 0.53 mg/dL (ref 0.44–1.00)
GFR, Estimated: >60 mL/min (ref >60)
Glucose, Bld: 79 mg/dL (ref 70–99)
Potassium: 3.1 mmol/L — ABNORMAL LOW (ref 3.5–5.1)
Sodium: 138 mmol/L (ref 135–145)
Total Bilirubin: 0.6 mg/dL (ref 0.3–1.2)
Total Protein: 5.8 g/dL — ABNORMAL LOW (ref 6.5–8.1)

## 2022-09-04 LAB — CBC
HCT: 29.6 % — ABNORMAL LOW (ref 36.0–46.0)
Hemoglobin: 9.4 g/dL — ABNORMAL LOW (ref 12.0–15.0)
MCH: 25.1 pg — ABNORMAL LOW (ref 26.0–34.0)
MCHC: 31.8 g/dL (ref 30.0–36.0)
MCV: 78.9 fL — ABNORMAL LOW (ref 80.0–100.0)
Platelets: 249 10*3/uL (ref 150–400)
RBC: 3.75 MIL/uL — ABNORMAL LOW (ref 3.87–5.11)
RDW: 15.5 % (ref 11.5–15.5)
WBC: 7.4 10*3/uL (ref 4.0–10.5)
nRBC: 0 % (ref 0.0–0.2)

## 2022-09-04 LAB — MAGNESIUM: Magnesium: 1.8 mg/dL (ref 1.7–2.4)

## 2022-09-04 MED ORDER — POTASSIUM CHLORIDE 20 MEQ PO PACK
60.0000 meq | PACK | Freq: Once | ORAL | Status: AC
  Administered 2022-09-04: 60 meq via ORAL
  Filled 2022-09-04: qty 3

## 2022-09-04 NOTE — TOC Progression Note (Signed)
Transition of Care [facility]) - Progression Note    Patient Details  Name: Donna Simmons MRN: [id number] Date of Birth: 1940/09/20  Transition of Care [facility] CM/SW [place], [place] Phone Number: 09/04/2022, 5:49 PM  Clinical Narrative:    CSW notified by medical team that pt's family are not yet ready to consider hospice. CSW met with spouse at bedside and again discussed the barriers to facility placement. At this time the only option is to get a Medicaid application pending and secure a facility to accept pt while it is pending. [facility] has agreed to review once that is in place. Several other facilities have offered, but spouse stating he cannot pay 30 days up front. Spouse stating he has no family to assist for her to come home. Palliative to follow back up later to continue conversation. As of today, spouse is still working on the application. The urgency of this was expressed and he noted understanding. CSW discussed pt with supervision. TOC will continue to follow for DC needs.        Expected Discharge Plan and Services                                               Social Determinants of Health (SDOH) Interventions SDOH Screenings   Food Insecurity: Unknown (07/03/2022)  Alcohol Screen: Low Risk  (04/12/2019)  Depression (PHQ2-9): Low Risk  (11/05/2020)  Tobacco Use: Low Risk  (08/20/2022)    Readmission Risk Interventions     No data to display

## 2022-09-04 NOTE — Progress Notes (Addendum)
PROGRESS NOTE  Donna Simmons [name] DOB: 02-16-1941 DOA: 08/25/2022 PCP: Donna Oman, DO   LOS: 9 days   Brief Narrative / Interim history: 82 year old with past medical history significant for Alzheimer's dementia, hypertension, hyperlipidemia, permanent A-fib on Eliquis admitted with failure to thrive, hypernatremia with a sodium of 166, poor oral intake.  Workup revealed UTI ESBL.  Patient treated with IV Ertapenem.  Currently awaiting placement.   Subjective / 24h Interval events: No overnight events, doing well this morning, does not interact much  Assesement and Plan: Principal Problem:   Hypernatremia Active Problems:   GERD (gastroesophageal reflux disease)   Leukocytosis   Thrombocytosis   Hypokalemia   Hypoalbuminemia due to protein-calorie malnutrition (HCC)   Transaminitis   AKI (acute kidney injury) ([place])   Permanent atrial fibrillation (HCC)   Iron deficiency anemia   Alzheimer's dementia ([place])   Insomnia   Failure to thrive (child)   Principal problem Hypernatremia - Presented  with a sodium level of 167, likely in the setting of poor oral intake and hypovolemia. Resolved with IV fluids, sodium now normalized.  Hold further fluids, allow p.o. intake and see how she does.  Hyponatremia confers a very poor prognosis in the setting of dementia, palliative care consulted, however family not ready for hospice  Active problems UTI, E. coli ESBL -patient was diagnosed with a ESBL E. coli, speciated 08/30/2022.  She was started on ertapenem on 12/30, completed 5 days of treatment with the last dose on 09/03/2022  Thrombocytosis -In the setting of infection.  Resolved   Hypokalemia -Replaced.   AKI -Present with a creatinine of 1.1.  Baseline 0.7, in the setting of hypovolemia, resolved IV fluids   Transaminases -Resolved.   Hypoalbuminemia - In the setting  of poor oral intake   Iron deficiency anemia - Hemoglobin was stable last time was checked    Permanent A-fib -Continue with Eliquis, continue flecainide, continue to hold metoprolol in the setting of soft blood pressures   Insomnia -Continue with melatonin   Alzheimer's dementia - Delirium precaution.   Scheduled Meds:  apixaban  5 mg Oral BID   feeding supplement  237 mL Oral TID BM   ferrous sulfate  325 mg Oral QODAY   flecainide  50 mg Oral BID   pantoprazole  40 mg Oral Daily   Continuous Infusions:   PRN Meds:.acetaminophen **OR** acetaminophen, melatonin, ondansetron **OR** ondansetron (ZOFRAN) IV  Current Outpatient Medications  Medication Instructions   acetaminophen (TYLENOL) 650 mg, Oral, Every 6 hours PRN   apixaban (ELIQUIS) 5 MG TABS tablet Take 1 tablet by mouth twice daily   Cholecalciferol (VITAMIN D-3 PO) 1 capsule, Oral, Daily   Ensure (ENSURE) 237 mLs, Oral, 2 times daily, Vanilla   flecainide (TAMBOCOR) 50 MG tablet TAKE 1 TABLET BY MOUTH TWICE DAILY. APPOINTMENT REQUIRED FOR FUTURE REFILLS   melatonin 5 mg, Oral, At bedtime PRN   metoprolol succinate (TOPROL-XL) 12.5 mg, Oral, Daily   omeprazole (PRILOSEC) 40 mg, Oral, Daily   polyethylene glycol (MIRALAX / GLYCOLAX) 17 g, Oral, Daily PRN   traZODone (DESYREL) 50 mg, Oral, Daily at bedtime    Diet Orders (From admission, onward)     Start     Ordered   08/29/22 1653  DIET DYS 3 Room service appropriate? Yes; Fluid consistency: Thin  Diet effective now       Question Answer Comment  Room service appropriate? Yes   Fluid consistency: Thin      08/29/22  1652            DVT prophylaxis: SCDs Start: 08/25/22 0659 apixaban (ELIQUIS) tablet 5 mg   Lab Results  Component Value Date   PLT 249 09/04/2022      Code Status: DNR  Family Communication: no family at bedside  Status is: Inpatient  Remains inpatient appropriate because: severity of illness  Level of care: Telemetry Medical  Consultants:  Palliative  Objective: Vitals:   09/03/22 1600 09/03/22 2155 09/04/22 0702  09/04/22 0833  BP: 115/76 (!) 151/71 (!) 115/58 111/77  Pulse: (!) 54 (!) 50 75 84  Resp: 16 15  17   Temp: (!) 97.5 F (36.4 C) 97.7 F (36.5 C) 98.6 F (37 C) 97.6 F (36.4 C)  TempSrc: Oral Oral  Oral  SpO2: 93% 90%    Weight:      Height:        Intake/Output Summary (Last 24 hours) at 09/04/2022 1123 Last data filed at 09/03/2022 1700 Gross per 24 hour  Intake 200 ml  Output 151 ml  Net 49 ml    Wt Readings from Last 3 Encounters:  08/30/22 50.2 kg  07/03/22 50.2 kg  04/24/22 54.4 kg    Examination:  Constitutional: NAD Respiratory: CTA Cardiovascular: RRR  Data Reviewed: I have independently reviewed following labs and imaging studies   CBC Recent Labs  Lab 08/30/22 0215 09/04/22 0256  WBC 7.8 7.4  HGB 10.2* 9.4*  HCT 32.2* 29.6*  PLT 261 249  MCV 79.5* 78.9*  MCH 25.2* 25.1*  MCHC 31.7 31.8  RDW 15.6* 15.5  LYMPHSABS 1.1  --   MONOABS 0.4  --   EOSABS 0.0  --   BASOSABS 0.0  --      Recent Labs  Lab 08/30/22 0215 08/30/22 1055 08/31/22 0200 09/01/22 0212 09/01/22 1005 09/01/22 1805 09/02/22 0246 09/02/22 1059 09/03/22 0226 09/04/22 0256  NA 143   < > 140   < > 139 135 140 137 136 138  K 3.6   < > 4.3   < > 4.9 3.7 3.7 3.8 3.5 3.1*  CL 112*   < > 108   < > 114* 104 106 106 104 101  CO2 23   < > 22   < > 18* 26 26 24 23 27   GLUCOSE 88   < > 93   < > 84 94 95 105* 94 79  BUN <5*   < > <5*   < > 8 8 7* 5* 5* <5*  CREATININE 0.50   < > 0.48   < > 0.55 0.60 0.50 0.46 0.43* 0.53  CALCIUM 8.6*   < > 8.3*   < > 8.3* 8.0* 8.2* 8.4* 8.3* 8.4*  AST 24  --   --   --   --   --   --   --   --  22  ALT 31  --   --   --   --   --   --   --   --  21  ALKPHOS 57  --   --   --   --   --   --   --   --  67  BILITOT 1.0  --   --   --   --   --   --   --   --  0.6  ALBUMIN 3.0*   < > 2.8*   < > 2.3* 2.2* 2.2* 2.4*  --  2.2*  MG 1.9  --  2.0  --   --   --   --   --  1.8 1.8   < > = values in this interval not displayed.      ------------------------------------------------------------------------------------------------------------------ No results for input(s): "CHOL", "HDL", "LDLCALC", "TRIG", "CHOLHDL", "LDLDIRECT" in the last 72 hours.  Lab Results  Component Value Date   HGBA1C 5.3 04/04/2020   ------------------------------------------------------------------------------------------------------------------ No results for input(s): "TSH", "T4TOTAL", "T3FREE", "THYROIDAB" in the last 72 hours.  Invalid input(s): "FREET3"  Cardiac Enzymes No results for input(s): "CKMB", "TROPONINI", "MYOGLOBIN" in the last 168 hours.  Invalid input(s): "CK" ------------------------------------------------------------------------------------------------------------------    Component Value Date/Time   BNP 73.3 10/09/2019 1035    CBG: Recent Labs  Lab 09/03/22 2214  GLUCAP 72    Recent Results (from the past 240 hour(s))  Urine Culture     Status: Abnormal   Collection Time: 08/28/22  2:30 PM   Specimen: Urine, Clean Catch  Result Value Ref Range Status   Specimen Description URINE, CLEAN CATCH  Final   Special Requests   Final    NONE Performed at [facility], [place] [address], [place], [place] [address]    Culture (A)  Final    >=100,000 COLONIES/mL ESCHERICHIA COLI Confirmed Extended Spectrum Beta-Lactamase Producer (ESBL).  In bloodstream infections from ESBL organisms, carbapenems are preferred over piperacillin/tazobactam. They are shown to have a lower risk of mortality.    Report Status 08/30/2022 FINAL  Final   Organism ID, Bacteria ESCHERICHIA COLI (A)  Final      Susceptibility   Escherichia coli - MIC*    AMPICILLIN >=32 RESISTANT Resistant     CEFAZOLIN >=64 RESISTANT Resistant     CEFEPIME >=32 RESISTANT Resistant     CEFTRIAXONE >=64 RESISTANT Resistant     CIPROFLOXACIN >=4 RESISTANT Resistant     GENTAMICIN <=1 SENSITIVE Sensitive     IMIPENEM 0.5 SENSITIVE Sensitive      NITROFURANTOIN 32 SENSITIVE Sensitive     TRIMETH/SULFA >=320 RESISTANT Resistant     AMPICILLIN/SULBACTAM >=32 RESISTANT Resistant     PIP/TAZO 16 SENSITIVE Sensitive     * >=100,000 COLONIES/mL ESCHERICHIA COLI  Culture, blood (Routine X 2) w Reflex to ID Panel     Status: None   Collection Time: 08/29/22  7:29 PM   Specimen: BLOOD RIGHT ARM  Result Value Ref Range Status   Specimen Description BLOOD RIGHT ARM  Final   Special Requests   Final    BOTTLES DRAWN AEROBIC AND ANAEROBIC Blood Culture adequate volume   Culture   Final    NO GROWTH 5 DAYS Performed at [facility], [address], [place], [place] [address]    Report Status 09/03/2022 FINAL  Final  Culture, blood (Routine X 2) w Reflex to ID Panel     Status: None   Collection Time: 08/29/22  7:30 PM   Specimen: BLOOD RIGHT ARM  Result Value Ref Range Status   Specimen Description BLOOD RIGHT ARM  Final   Special Requests   Final    BOTTLES DRAWN AEROBIC AND ANAEROBIC Blood Culture adequate volume   Culture   Final    NO GROWTH 5 DAYS Performed at [facility], [place] [address] [place],  [address]    Report Status 09/03/2022 FINAL  Final     Radiology Studies: No results found.   Marzetta Board, MD, PhD [facility]  Between 7 am - 7 pm I am  available, please contact me via Amion (for emergencies) or Securechat (non urgent messages)  Between 7 pm - 7 am I am not available, please contact night coverage MD/APP via Amion

## 2022-09-05 DIAGNOSIS — E870 Hyperosmolality and hypernatremia: Secondary | ICD-10-CM | POA: Diagnosis not present

## 2022-09-05 LAB — BASIC METABOLIC PANEL
Anion gap: 8 (ref 5–15)
BUN: 8 mg/dL (ref 8–23)
CO2: 25 mmol/L (ref 22–32)
Calcium: 8.3 mg/dL — ABNORMAL LOW (ref 8.9–10.3)
Chloride: 103 mmol/L (ref 98–111)
Creatinine, Ser: 0.46 mg/dL (ref 0.44–1.00)
GFR, Estimated: >60 mL/min (ref >60)
Glucose, Bld: 88 mg/dL (ref 70–99)
Potassium: 3.8 mmol/L (ref 3.5–5.1)
Sodium: 136 mmol/L (ref 135–145)

## 2022-09-05 NOTE — Progress Notes (Signed)
PROGRESS NOTE  Donna Simmons RJJ:[id number] DOB: 1941/08/15 DOA: 08/25/2022 PCP: [name], DO   LOS: 10 days   Brief Narrative / Interim history: 82 year old with past medical history significant for Alzheimer's dementia, hypertension, hyperlipidemia, permanent A-fib on Eliquis admitted with failure to thrive, hypernatremia with a sodium of 166, poor oral intake.  Workup revealed UTI ESBL.  Patient treated with IV Ertapenem.  Currently awaiting placement.   Subjective / 24h Interval events: No overnight events, doing well this morning.  Minimally interactive  Assesement and Plan: Principal Problem:   Hypernatremia Active Problems:   GERD (gastroesophageal reflux disease)   Leukocytosis   Thrombocytosis   Hypokalemia   Hypoalbuminemia due to protein-calorie malnutrition (HCC)   Transaminitis   AKI (acute kidney injury) ([place])   Permanent atrial fibrillation (HCC)   Iron deficiency anemia   Alzheimer's dementia ([place])   Insomnia   Failure to thrive (child)   Principal problem Hypernatremia - Presented  with a sodium level of 167, likely in the setting of poor oral intake and hypovolemia. Resolved with IV fluids, sodium now normalized.  Hold further fluids, allow p.o. intake and see how she does.  Hyponatremia confers a very poor prognosis in the setting of dementia, palliative care consulted, however family not ready for hospice -Sodium actually stable this morning, little bit improved  Active problems UTI, E. coli ESBL -patient was diagnosed with a ESBL E. coli, speciated 08/30/2022.  She was started on ertapenem on 12/30, status post IV days of antibiotics  Thrombocytosis -In the setting of infection.  Resolved   Hypokalemia -Replaced.   AKI -Present with a creatinine of 1.1.  Baseline 0.7, in the setting of hypovolemia, resolved IV fluids.  Creatinine remains normal today   Transaminases -Resolved.   Hypoalbuminemia - In the setting  of poor oral intake   Iron  deficiency anemia - Hemoglobin was stable last time was checked   Permanent A-fib -Continue with Eliquis, continue flecainide, continue to hold metoprolol in the setting of soft blood pressures   Insomnia -Continue with melatonin   Alzheimer's dementia - Delirium precaution.   Disposition-Per Education officer, museum, she cannot return to her previous SNF due to financial issues, they will also need to pay 30 days upfront which spouses is unable to do so.  There is no family to assist her at home.  Difficult to place  Scheduled Meds:  apixaban  5 mg Oral BID   feeding supplement  237 mL Oral TID BM   ferrous sulfate  325 mg Oral QODAY   flecainide  50 mg Oral BID   pantoprazole  40 mg Oral Daily   Continuous Infusions:   PRN Meds:.acetaminophen **OR** acetaminophen, melatonin, ondansetron **OR** ondansetron (ZOFRAN) IV  Current Outpatient Medications  Medication Instructions   acetaminophen (TYLENOL) 650 mg, Oral, Every 6 hours PRN   apixaban (ELIQUIS) 5 MG TABS tablet Take 1 tablet by mouth twice daily   Cholecalciferol (VITAMIN D-3 PO) 1 capsule, Oral, Daily   Ensure (ENSURE) 237 mLs, Oral, 2 times daily, Vanilla   flecainide (TAMBOCOR) 50 MG tablet TAKE 1 TABLET BY MOUTH TWICE DAILY. APPOINTMENT REQUIRED FOR FUTURE REFILLS   melatonin 5 mg, Oral, At bedtime PRN   metoprolol succinate (TOPROL-XL) 12.5 mg, Oral, Daily   omeprazole (PRILOSEC) 40 mg, Oral, Daily   polyethylene glycol (MIRALAX / GLYCOLAX) 17 g, Oral, Daily PRN   traZODone (DESYREL) 50 mg, Oral, Daily at bedtime    Diet Orders (From admission, onward)  Start     Ordered   08/29/22 1653  DIET DYS 3 Room service appropriate? Yes; Fluid consistency: Thin  Diet effective now       Question Answer Comment  Room service appropriate? Yes   Fluid consistency: Thin      08/29/22 1652            DVT prophylaxis: SCDs Start: 08/25/22 0659 apixaban (ELIQUIS) tablet 5 mg   Lab Results  Component Value Date   PLT 249  09/04/2022      Code Status: DNR  Family Communication: no family at bedside  Status is: Inpatient  Remains inpatient appropriate because: severity of illness  Level of care: Med-Surg  Consultants:  Palliative  Objective: Vitals:   09/04/22 1643 09/04/22 2010 09/05/22 0600 09/05/22 0756  BP: 133/80 (!) 96/58 (!) 125/94 93/60  Pulse: 98 95 86 71  Resp: 16 17 18 16   Temp: (!) 97.5 F (36.4 C) 98.2 F (36.8 C) 98.2 F (36.8 C) 98 F (36.7 C)  TempSrc: Axillary Oral Oral Axillary  SpO2: 100% 96% 100% 99%  Weight:      Height:        Intake/Output Summary (Last 24 hours) at 09/05/2022 [id number] Last data filed at 09/04/2022 1856 Gross per 24 hour  Intake 600 ml  Output 350 ml  Net 250 ml    Wt Readings from Last 3 Encounters:  08/30/22 50.2 kg  07/03/22 50.2 kg  04/24/22 54.4 kg    Examination:  Constitutional: NAD Eyes: lids and conjunctivae normal, no scleral icterus ENMT: mmm Neck: normal, supple Respiratory: clear to auscultation bilaterally, no wheezing, no crackles. Normal respiratory effort.  Cardiovascular: Regular rate and rhythm, no murmurs / rubs / gallops. No LE edema. Abdomen: soft, no distention, no tenderness. Bowel sounds positive.   Data Reviewed: I have independently reviewed following labs and imaging studies   CBC Recent Labs  Lab 08/30/22 0215 09/04/22 0256  WBC 7.8 7.4  HGB 10.2* 9.4*  HCT 32.2* 29.6*  PLT 261 249  MCV 79.5* 78.9*  MCH 25.2* 25.1*  MCHC 31.7 31.8  RDW 15.6* 15.5  LYMPHSABS 1.1  --   MONOABS 0.4  --   EOSABS 0.0  --   BASOSABS 0.0  --      Recent Labs  Lab 08/30/22 0215 08/30/22 1055 08/31/22 0200 09/01/22 0212 09/01/22 1005 09/01/22 1805 09/02/22 0246 09/02/22 1059 09/03/22 0226 09/04/22 0256 09/05/22 0545  NA 143   < > 140   < > 139 135 140 137 136 138 136  K 3.6   < > 4.3   < > 4.9 3.7 3.7 3.8 3.5 3.1* 3.8  CL 112*   < > 108   < > 114* 104 106 106 104 101 103  CO2 23   < > 22   < > 18* 26 26 24  23 27 25   GLUCOSE 88   < > 93   < > 84 94 95 105* 94 79 88  BUN <5*   < > <5*   < > 8 8 7* 5* 5* <5* 8  CREATININE 0.50   < > 0.48   < > 0.55 0.60 0.50 0.46 0.43* 0.53 0.46  CALCIUM 8.6*   < > 8.3*   < > 8.3* 8.0* 8.2* 8.4* 8.3* 8.4* 8.3*  AST 24  --   --   --   --   --   --   --   --  22  --  ALT 31  --   --   --   --   --   --   --   --  21  --   ALKPHOS 57  --   --   --   --   --   --   --   --  67  --   BILITOT 1.0  --   --   --   --   --   --   --   --  0.6  --   ALBUMIN 3.0*   < > 2.8*   < > 2.3* 2.2* 2.2* 2.4*  --  2.2*  --   MG 1.9  --  2.0  --   --   --   --   --  1.8 1.8  --    < > = values in this interval not displayed.     ------------------------------------------------------------------------------------------------------------------ No results for input(s): "CHOL", "HDL", "LDLCALC", "TRIG", "CHOLHDL", "LDLDIRECT" in the last 72 hours.  Lab Results  Component Value Date   HGBA1C 5.3 04/04/2020   ------------------------------------------------------------------------------------------------------------------ No results for input(s): "TSH", "T4TOTAL", "T3FREE", "THYROIDAB" in the last 72 hours.  Invalid input(s): "FREET3"  Cardiac Enzymes No results for input(s): "CKMB", "TROPONINI", "MYOGLOBIN" in the last 168 hours.  Invalid input(s): "CK" ------------------------------------------------------------------------------------------------------------------    Component Value Date/Time   BNP 73.3 10/09/2019 1035    CBG: Recent Labs  Lab 09/03/22 2214  GLUCAP 72     Recent Results (from the past 240 hour(s))  Urine Culture     Status: Abnormal   Collection Time: 08/28/22  2:30 PM   Specimen: Urine, Clean Catch  Result Value Ref Range Status   Specimen Description URINE, CLEAN CATCH  Final   Special Requests   Final    NONE Performed at [facility], [place] [address] [place], [place] [address]    Culture (A)  Final    >=100,000 COLONIES/mL  ESCHERICHIA COLI Confirmed Extended Spectrum Beta-Lactamase Producer (ESBL).  In bloodstream infections from ESBL organisms, carbapenems are preferred over piperacillin/tazobactam. They are shown to have a lower risk of mortality.    Report Status 08/30/2022 FINAL  Final   Organism ID, Bacteria ESCHERICHIA COLI (A)  Final      Susceptibility   Escherichia coli - MIC*    AMPICILLIN >=32 RESISTANT Resistant     CEFAZOLIN >=64 RESISTANT Resistant     CEFEPIME >=32 RESISTANT Resistant     CEFTRIAXONE >=64 RESISTANT Resistant     CIPROFLOXACIN >=4 RESISTANT Resistant     GENTAMICIN <=1 SENSITIVE Sensitive     IMIPENEM 0.5 SENSITIVE Sensitive     NITROFURANTOIN 32 SENSITIVE Sensitive     TRIMETH/SULFA >=320 RESISTANT Resistant     AMPICILLIN/SULBACTAM >=32 RESISTANT Resistant     PIP/TAZO 16 SENSITIVE Sensitive     * >=100,000 COLONIES/mL ESCHERICHIA COLI  Culture, blood (Routine X 2) w Reflex to ID Panel     Status: None   Collection Time: 08/29/22  7:29 PM   Specimen: BLOOD RIGHT ARM  Result Value Ref Range Status   Specimen Description BLOOD RIGHT ARM  Final   Special Requests   Final    BOTTLES DRAWN AEROBIC AND ANAEROBIC Blood Culture adequate volume   Culture   Final    NO GROWTH 5 DAYS Performed at [facility], [address] [place], [place] [address]    Report Status 09/03/2022 FINAL  Final  Culture, blood (Routine X 2) w  Reflex to ID Panel     Status: None   Collection Time: 08/29/22  7:30 PM   Specimen: BLOOD RIGHT ARM  Result Value Ref Range Status   Specimen Description BLOOD RIGHT ARM  Final   Special Requests   Final    BOTTLES DRAWN AEROBIC AND ANAEROBIC Blood Culture adequate volume   Culture   Final    NO GROWTH 5 DAYS Performed at [facility], [address], [place], [place] [address]    Report Status 09/03/2022 FINAL  Final     Radiology Studies: No results found.   Marzetta Board, MD, PhD [facility]  Between 7 am - 7 pm I am  available, please contact me via Amion (for emergencies) or Securechat (non urgent messages)  Between 7 pm - 7 am I am not available, please contact night coverage MD/APP via Amion

## 2022-09-06 DIAGNOSIS — E870 Hyperosmolality and hypernatremia: Secondary | ICD-10-CM | POA: Diagnosis not present

## 2022-09-06 LAB — BASIC METABOLIC PANEL
Anion gap: 7 (ref 5–15)
BUN: 6 mg/dL — ABNORMAL LOW (ref 8–23)
CO2: 25 mmol/L (ref 22–32)
Calcium: 8.2 mg/dL — ABNORMAL LOW (ref 8.9–10.3)
Chloride: 104 mmol/L (ref 98–111)
Creatinine, Ser: 0.53 mg/dL (ref 0.44–1.00)
GFR, Estimated: >60 mL/min (ref >60)
Glucose, Bld: 97 mg/dL (ref 70–99)
Potassium: 3.9 mmol/L (ref 3.5–5.1)
Sodium: 136 mmol/L (ref 135–145)

## 2022-09-06 NOTE — Progress Notes (Signed)
No PROGRESS NOTE  ARROW EMMERICH XIP:[id number] DOB: Dec 14, 1940 DOA: 08/25/2022 PCP: Yolanda Manges, DO   LOS: 11 days   Brief Narrative / Interim history: 82 year old with past medical history significant for Alzheimer's dementia, hypertension, hyperlipidemia, permanent A-fib on Eliquis admitted with failure to thrive, hypernatremia with a sodium of 166, poor oral intake.  Workup revealed UTI ESBL.  Patient treated with IV Ertapenem.  Currently awaiting placement.   Subjective / 24h Interval events: Overnight events, comfortable this morning  Assesement and Plan: Principal Problem:   Hypernatremia Active Problems:   GERD (gastroesophageal reflux disease)   Leukocytosis   Thrombocytosis   Hypokalemia   Hypoalbuminemia due to protein-calorie malnutrition (HCC)   Transaminitis   AKI (acute kidney injury) (HCC)   Permanent atrial fibrillation (HCC)   Iron deficiency anemia   Alzheimer's dementia (HCC)   Insomnia   Failure to thrive (child)   Principal problem Hypernatremia - Presented  with a sodium level of 167, likely in the setting of poor oral intake and hypovolemia. Resolved with IV fluids, sodium now normalized.  Hold further fluids, allow p.o. intake and see how she does.  Hyponatremia confers a very poor prognosis in the setting of dementia, palliative care consulted, however family not ready for hospice -Sodium has actually stabilized  Active problems UTI, E. coli ESBL -patient was diagnosed with a ESBL E. coli, speciated 08/30/2022.  She was started on ertapenem on 12/30, status post 5 days of antibiotics  Thrombocytosis -In the setting of infection.  Resolved   Hypokalemia -Replaced.   AKI -Present with a creatinine of 1.1.  Baseline 0.7, in the setting of hypovolemia, resolved IV fluids.  Creatinine remains normal today   Transaminases -Resolved.   Hypoalbuminemia - In the setting  of poor oral intake   Iron deficiency anemia - Hemoglobin was stable last time  was checked   Permanent A-fib -Continue with Eliquis, continue flecainide, continue to hold metoprolol in the setting of soft blood pressures   Insomnia -Continue with melatonin   Alzheimer's dementia - Delirium precaution.   Disposition-Per Child psychotherapist, she cannot return to her previous SNF due to financial issues, they will also need to pay 30 days upfront which spouses is unable to do so.  There is no family to assist her at home.  Difficult to place  Scheduled Meds:  apixaban  5 mg Oral BID   feeding supplement  237 mL Oral TID BM   ferrous sulfate  325 mg Oral QODAY   flecainide  50 mg Oral BID   pantoprazole  40 mg Oral Daily   Continuous Infusions:   PRN Meds:.acetaminophen **OR** acetaminophen, melatonin, ondansetron **OR** ondansetron (ZOFRAN) IV  Current Outpatient Medications  Medication Instructions   acetaminophen (TYLENOL) 650 mg, Oral, Every 6 hours PRN   apixaban (ELIQUIS) 5 MG TABS tablet Take 1 tablet by mouth twice daily   Cholecalciferol (VITAMIN D-3 PO) 1 capsule, Oral, Daily   Ensure (ENSURE) 237 mLs, Oral, 2 times daily, Vanilla   flecainide (TAMBOCOR) 50 MG tablet TAKE 1 TABLET BY MOUTH TWICE DAILY. APPOINTMENT REQUIRED FOR FUTURE REFILLS   melatonin 5 mg, Oral, At bedtime PRN   metoprolol succinate (TOPROL-XL) 12.5 mg, Oral, Daily   omeprazole (PRILOSEC) 40 mg, Oral, Daily   polyethylene glycol (MIRALAX / GLYCOLAX) 17 g, Oral, Daily PRN   traZODone (DESYREL) 50 mg, Oral, Daily at bedtime    Diet Orders (From admission, onward)     Start     Ordered  08/29/22 1653  DIET DYS 3 Room service appropriate? Yes; Fluid consistency: Thin  Diet effective now       Question Answer Comment  Room service appropriate? Yes   Fluid consistency: Thin      08/29/22 1652            DVT prophylaxis: SCDs Start: 08/25/22 0659 apixaban (ELIQUIS) tablet 5 mg   Lab Results  Component Value Date   PLT 249 09/04/2022      Code Status: DNR  Family  Communication: no family at bedside  Status is: Inpatient  Remains inpatient appropriate because: severity of illness  Level of care: Med-Surg  Consultants:  Palliative  Objective: Vitals:   09/05/22 0756 09/05/22 1610 09/05/22 2039 09/06/22 0740  BP: 93/60 112/64 (!) 98/51 105/63  Pulse: 71 88 96 94  Resp: 16 17 16 16   Temp: 98 F (36.7 C) 97.9 F (36.6 C) 99 F (37.2 C) 98.8 F (37.1 C)  TempSrc: Axillary Axillary Oral Oral  SpO2: 99% 100% 100% 100%  Weight:      Height:        Intake/Output Summary (Last 24 hours) at 09/06/2022 0904 Last data filed at 09/05/2022 1900 Gross per 24 hour  Intake 480 ml  Output 100 ml  Net 380 ml    Wt Readings from Last 3 Encounters:  08/30/22 50.2 kg  07/03/22 50.2 kg  04/24/22 54.4 kg    Examination:  Constitutional: NAD Respiratory: CTA Cardiovascular: RRR  Data Reviewed: I have independently reviewed following labs and imaging studies   CBC Recent Labs  Lab 09/04/22 0256  WBC 7.4  HGB 9.4*  HCT 29.6*  PLT 249  MCV 78.9*  MCH 25.1*  MCHC 31.8  RDW 15.5     Recent Labs  Lab 08/31/22 0200 09/01/22 0212 09/01/22 1005 09/01/22 1805 09/02/22 0246 09/02/22 1059 09/03/22 0226 09/04/22 0256 09/05/22 0545 09/06/22 0313  NA 140   < > 139 135 140 137 136 138 136 136  K 4.3   < > 4.9 3.7 3.7 3.8 3.5 3.1* 3.8 3.9  CL 108   < > 114* 104 106 106 104 101 103 104  CO2 22   < > 18* 26 26 24 23 27 25 25   GLUCOSE 93   < > 84 94 95 105* 94 79 88 97  BUN <5*   < > 8 8 7* 5* 5* <5* 8 6*  CREATININE 0.48   < > 0.55 0.60 0.50 0.46 0.43* 0.53 0.46 0.53  CALCIUM 8.3*   < > 8.3* 8.0* 8.2* 8.4* 8.3* 8.4* 8.3* 8.2*  AST  --   --   --   --   --   --   --  22  --   --   ALT  --   --   --   --   --   --   --  21  --   --   ALKPHOS  --   --   --   --   --   --   --  41  --   --   BILITOT  --   --   --   --   --   --   --  0.6  --   --   ALBUMIN 2.8*   < > 2.3* 2.2* 2.2* 2.4*  --  2.2*  --   --   MG 2.0  --   --   --   --   --  1.8 1.8  --   --    < > = values in this interval not displayed.     ------------------------------------------------------------------------------------------------------------------ No results for input(s): "CHOL", "HDL", "LDLCALC", "TRIG", "CHOLHDL", "LDLDIRECT" in the last 72 hours.  Lab Results  Component Value Date   HGBA1C 5.3 04/04/2020   ------------------------------------------------------------------------------------------------------------------ No results for input(s): "TSH", "T4TOTAL", "T3FREE", "THYROIDAB" in the last 72 hours.  Invalid input(s): "FREET3"  Cardiac Enzymes No results for input(s): "CKMB", "TROPONINI", "MYOGLOBIN" in the last 168 hours.  Invalid input(s): "CK" ------------------------------------------------------------------------------------------------------------------    Component Value Date/Time   BNP 73.3 10/09/2019 1035    CBG: Recent Labs  Lab 09/03/22 2214  GLUCAP 72     Recent Results (from the past 240 hour(s))  Urine Culture     Status: Abnormal   Collection Time: 08/28/22  2:30 PM   Specimen: Urine, Clean Catch  Result Value Ref Range Status   Specimen Description URINE, CLEAN CATCH  Final   Special Requests   Final    NONE Performed at [facility], [address], [place], [place] [address]    Culture (A)  Final    >=100,000 COLONIES/mL ESCHERICHIA COLI Confirmed Extended Spectrum Beta-Lactamase Producer (ESBL).  In bloodstream infections from ESBL organisms, carbapenems are preferred over piperacillin/tazobactam. They are shown to have a lower risk of mortality.    Report Status 08/30/2022 FINAL  Final   Organism ID, Bacteria ESCHERICHIA COLI (A)  Final      Susceptibility   Escherichia coli - MIC*    AMPICILLIN >=32 RESISTANT Resistant     CEFAZOLIN >=64 RESISTANT Resistant     CEFEPIME >=32 RESISTANT Resistant     CEFTRIAXONE >=64 RESISTANT Resistant     CIPROFLOXACIN >=4 RESISTANT Resistant      GENTAMICIN <=1 SENSITIVE Sensitive     IMIPENEM 0.5 SENSITIVE Sensitive     NITROFURANTOIN 32 SENSITIVE Sensitive     TRIMETH/SULFA >=320 RESISTANT Resistant     AMPICILLIN/SULBACTAM >=32 RESISTANT Resistant     PIP/TAZO 16 SENSITIVE Sensitive     * >=100,000 COLONIES/mL ESCHERICHIA COLI  Culture, blood (Routine X 2) w Reflex to ID Panel     Status: None   Collection Time: 08/29/22  7:29 PM   Specimen: BLOOD RIGHT ARM  Result Value Ref Range Status   Specimen Description BLOOD RIGHT ARM  Final   Special Requests   Final    BOTTLES DRAWN AEROBIC AND ANAEROBIC Blood Culture adequate volume   Culture   Final    NO GROWTH 5 DAYS Performed at [facility], [address], [place], [place] [address]    Report Status 09/03/2022 FINAL  Final  Culture, blood (Routine X 2) w Reflex to ID Panel     Status: None   Collection Time: 08/29/22  7:30 PM   Specimen: BLOOD RIGHT ARM  Result Value Ref Range Status   Specimen Description BLOOD RIGHT ARM  Final   Special Requests   Final    BOTTLES DRAWN AEROBIC AND ANAEROBIC Blood Culture adequate volume   Culture   Final    NO GROWTH 5 DAYS Performed at [facility], [address], [place], [place] [address]    Report Status 09/03/2022 FINAL  Final     Radiology Studies: No results found.   Pamella Pert, MD, PhD [facility]  Between 7 am - 7 pm I am available, please contact me via Amion (for emergencies) or Securechat (non urgent messages)  Between  7 pm - 7 am I am not available, please contact night coverage MD/APP via Amion

## 2022-09-07 DIAGNOSIS — E870 Hyperosmolality and hypernatremia: Secondary | ICD-10-CM | POA: Diagnosis not present

## 2022-09-07 NOTE — Progress Notes (Signed)
No PROGRESS NOTE  Donna Simmons [id number]:[id number] DOB: 10-Nov-1940 DOA: 08/25/2022 PCP: [name], DO   LOS: 12 days   Brief Narrative / Interim history: 82 year old with past medical history significant for Alzheimer's dementia, hypertension, hyperlipidemia, permanent A-fib on Eliquis admitted with failure to thrive, hypernatremia with a sodium of 166, poor oral intake.  Workup revealed UTI ESBL.  Patient treated with IV Ertapenem.  Currently awaiting placement.   Subjective / 24h Interval events: Comfortable, does not interact much  Assesement and Plan: Principal Problem:   Hypernatremia Active Problems:   GERD (gastroesophageal reflux disease)   Leukocytosis   Thrombocytosis   Hypokalemia   Hypoalbuminemia due to protein-calorie malnutrition (HCC)   Transaminitis   AKI (acute kidney injury) ([place])   Permanent atrial fibrillation (HCC)   Iron deficiency anemia   Alzheimer's dementia ([place])   Insomnia   Failure to thrive (child)   Principal problem Hypernatremia - Presented  with a sodium level of 167, likely in the setting of poor oral intake and hypovolemia. Resolved with IV fluids, sodium now normalized.  Hold further fluids, allow p.o. intake and see how she does.  Hyponatremia confers a very poor prognosis in the setting of dementia, palliative care consulted, however family not ready for hospice -Sodium has actually stabilized  Active problems UTI, E. coli ESBL -patient was diagnosed with a ESBL E. coli, speciated 08/30/2022.  She was started on ertapenem on 12/30, status post 5 days of antibiotics  Thrombocytosis -In the setting of infection.  Resolved   Hypokalemia -Replaced.   AKI -Present with a creatinine of 1.1.  Baseline 0.7, in the setting of hypovolemia, resolved IV fluids.  Creatinine remains normal today   Transaminases -Resolved.   Hypoalbuminemia - In the setting  of poor oral intake   Iron deficiency anemia - Hemoglobin was stable last time was  checked   Permanent A-fib -Continue with Eliquis, continue flecainide, continue to hold metoprolol in the setting of soft blood pressures   Insomnia -Continue with melatonin   Alzheimer's dementia - Delirium precaution.   Disposition-Per Education officer, museum, she cannot return to her previous SNF due to financial issues, they will also need to pay 30 days upfront which spouses is unable to do so.  There is no family to assist her at home.  Difficult to place  Scheduled Meds:  apixaban  5 mg Oral BID   feeding supplement  237 mL Oral TID BM   ferrous sulfate  325 mg Oral QODAY   flecainide  50 mg Oral BID   pantoprazole  40 mg Oral Daily   Continuous Infusions:   PRN Meds:.acetaminophen **OR** acetaminophen, melatonin, ondansetron **OR** ondansetron (ZOFRAN) IV  Current Outpatient Medications  Medication Instructions   acetaminophen (TYLENOL) 650 mg, Oral, Every 6 hours PRN   apixaban (ELIQUIS) 5 MG TABS tablet Take 1 tablet by mouth twice daily   Cholecalciferol (VITAMIN D-3 PO) 1 capsule, Oral, Daily   Ensure (ENSURE) 237 mLs, Oral, 2 times daily, Vanilla   flecainide (TAMBOCOR) 50 MG tablet TAKE 1 TABLET BY MOUTH TWICE DAILY. APPOINTMENT REQUIRED FOR FUTURE REFILLS   melatonin 5 mg, Oral, At bedtime PRN   metoprolol succinate (TOPROL-XL) 12.5 mg, Oral, Daily   omeprazole (PRILOSEC) 40 mg, Oral, Daily   polyethylene glycol (MIRALAX / GLYCOLAX) 17 g, Oral, Daily PRN   traZODone (DESYREL) 50 mg, Oral, Daily at bedtime    Diet Orders (From admission, onward)     Start     Ordered  08/29/22 1653  DIET DYS 3 Room service appropriate? Yes; Fluid consistency: Thin  Diet effective now       Question Answer Comment  Room service appropriate? Yes   Fluid consistency: Thin      08/29/22 1652            DVT prophylaxis: SCDs Start: 08/25/22 0659 apixaban (ELIQUIS) tablet 5 mg   Lab Results  Component Value Date   PLT 249 09/04/2022      Code Status: DNR  Family  Communication: no family at bedside  Status is: Inpatient  Remains inpatient appropriate because: severity of illness  Level of care: Med-Surg  Consultants:  Palliative  Objective: Vitals:   09/06/22 1556 09/06/22 2159 09/07/22 0600 09/07/22 0829  BP: (!) 109/57 102/72 102/61 124/84  Pulse: 95 100 92 (!) 44  Resp: 16  16 17   Temp: 97.8 F (36.6 C) 99.2 F (37.3 C) 97.7 F (36.5 C) 98.7 F (37.1 C)  TempSrc: Oral Axillary Axillary Oral  SpO2: 99% 100% 100% 90%  Weight:      Height:       No intake or output data in the 24 hours ending 09/07/22 1523  Wt Readings from Last 3 Encounters:  08/30/22 50.2 kg  07/03/22 50.2 kg  04/24/22 54.4 kg    Examination:  Constitutional: NAD Respiratory: CTA Cardiovascular: RRR  Data Reviewed: I have independently reviewed following labs and imaging studies   CBC Recent Labs  Lab 09/04/22 0256  WBC 7.4  HGB 9.4*  HCT 29.6*  PLT 249  MCV 78.9*  MCH 25.1*  MCHC 31.8  RDW 15.5     Recent Labs  Lab 09/01/22 1005 09/01/22 1805 09/02/22 0246 09/02/22 1059 09/03/22 0226 09/04/22 0256 09/05/22 0545 09/06/22 0313  NA 139 135 140 137 136 138 136 136  K 4.9 3.7 3.7 3.8 3.5 3.1* 3.8 3.9  CL 114* 104 106 106 104 101 103 104  CO2 18* 26 26 24 23 27 25 25   GLUCOSE 84 94 95 105* 94 79 88 97  BUN 8 8 7* 5* 5* <5* 8 6*  CREATININE 0.55 0.60 0.50 0.46 0.43* 0.53 0.46 0.53  CALCIUM 8.3* 8.0* 8.2* 8.4* 8.3* 8.4* 8.3* 8.2*  AST  --   --   --   --   --  22  --   --   ALT  --   --   --   --   --  21  --   --   ALKPHOS  --   --   --   --   --  67  --   --   BILITOT  --   --   --   --   --  0.6  --   --   ALBUMIN 2.3* 2.2* 2.2* 2.4*  --  2.2*  --   --   MG  --   --   --   --  1.8 1.8  --   --      ------------------------------------------------------------------------------------------------------------------ No results for input(s): "CHOL", "HDL", "LDLCALC", "TRIG", "CHOLHDL", "LDLDIRECT" in the last 72 hours.  Lab  Results  Component Value Date   HGBA1C 5.3 04/04/2020   ------------------------------------------------------------------------------------------------------------------ No results for input(s): "TSH", "T4TOTAL", "T3FREE", "THYROIDAB" in the last 72 hours.  Invalid input(s): "FREET3"  Cardiac Enzymes No results for input(s): "CKMB", "TROPONINI", "MYOGLOBIN" in the last 168 hours.  Invalid input(s): "CK" ------------------------------------------------------------------------------------------------------------------    Component Value Date/Time   BNP 73.3  10/09/2019 1035    CBG: Recent Labs  Lab 09/03/22 2214  GLUCAP 72     Recent Results (from the past 240 hour(s))  Culture, blood (Routine X 2) w Reflex to ID Panel     Status: None   Collection Time: 08/29/22  7:29 PM   Specimen: BLOOD RIGHT ARM  Result Value Ref Range Status   Specimen Description BLOOD RIGHT ARM  Final   Special Requests   Final    BOTTLES DRAWN AEROBIC AND ANAEROBIC Blood Culture adequate volume   Culture   Final    NO GROWTH 5 DAYS Performed at [facility], [address], [place], [place] [address]    Report Status 09/03/2022 FINAL  Final  Culture, blood (Routine X 2) w Reflex to ID Panel     Status: None   Collection Time: 08/29/22  7:30 PM   Specimen: BLOOD RIGHT ARM  Result Value Ref Range Status   Specimen Description BLOOD RIGHT ARM  Final   Special Requests   Final    BOTTLES DRAWN AEROBIC AND ANAEROBIC Blood Culture adequate volume   Culture   Final    NO GROWTH 5 DAYS Performed at [facility], [address], [place], [place] [address]    Report Status 09/03/2022 FINAL  Final     Radiology Studies: No results found.   Pamella Pert, MD, PhD [facility]  Between 7 am - 7 pm I am available, please contact me via Amion (for emergencies) or Securechat (non urgent messages)  Between 7 pm - 7 am I am not available, please contact night coverage MD/APP via  Amion

## 2022-09-08 DIAGNOSIS — E870 Hyperosmolality and hypernatremia: Secondary | ICD-10-CM | POA: Diagnosis not present

## 2022-09-08 LAB — CBC
HCT: 27.5 % — ABNORMAL LOW (ref 36.0–46.0)
Hemoglobin: 8.9 g/dL — ABNORMAL LOW (ref 12.0–15.0)
MCH: 25.1 pg — ABNORMAL LOW (ref 26.0–34.0)
MCHC: 32.4 g/dL (ref 30.0–36.0)
MCV: 77.5 fL — ABNORMAL LOW (ref 80.0–100.0)
Platelets: 336 10*3/uL (ref 150–400)
RBC: 3.55 MIL/uL — ABNORMAL LOW (ref 3.87–5.11)
RDW: 15.8 % — ABNORMAL HIGH (ref 11.5–15.5)
WBC: 8.6 10*3/uL (ref 4.0–10.5)
nRBC: 0 % (ref 0.0–0.2)

## 2022-09-08 LAB — BASIC METABOLIC PANEL
Anion gap: 10 (ref 5–15)
BUN: 5 mg/dL — ABNORMAL LOW (ref 8–23)
CO2: 25 mmol/L (ref 22–32)
Calcium: 8.6 mg/dL — ABNORMAL LOW (ref 8.9–10.3)
Chloride: 101 mmol/L (ref 98–111)
Creatinine, Ser: 0.51 mg/dL (ref 0.44–1.00)
GFR, Estimated: >60 mL/min (ref >60)
Glucose, Bld: 98 mg/dL (ref 70–99)
Potassium: 3.5 mmol/L (ref 3.5–5.1)
Sodium: 136 mmol/L (ref 135–145)

## 2022-09-08 MED ORDER — POTASSIUM CHLORIDE 20 MEQ PO PACK
40.0000 meq | PACK | Freq: Once | ORAL | Status: AC
  Administered 2022-09-08: 40 meq via ORAL
  Filled 2022-09-08: qty 2

## 2022-09-08 NOTE — TOC Progression Note (Signed)
Transition of Care [facility]) - Progression Note    Patient Details  Name: ODA PLACKE MRN: [id number] Date of Birth: Aug 22, 1941  Transition of Care [facility]) CM/SW [place], [place] Phone Number: 09/08/2022, 11:34 AM  Clinical Narrative:    TOC is continuing to follow for placement for pt. [name] is currently hospitalized as well. Spouse to be working on Insurance underwriter. This is currently on hold due to spouse stating there is no other family or support. Will continue to follow with spouse for when he is medically stable.         Expected Discharge Plan and Services                                               Social Determinants of Health (SDOH) Interventions SDOH Screenings   Food Insecurity: Unknown (07/03/2022)  Alcohol Screen: Low Risk  (04/12/2019)  Depression (PHQ2-9): Low Risk  (11/05/2020)  Tobacco Use: Low Risk  (08/20/2022)    Readmission Risk Interventions     No data to display

## 2022-09-08 NOTE — Progress Notes (Signed)
No PROGRESS NOTE  CRETA DORAME RJJ:[id number] DOB: 1940/12/10 DOA: 08/25/2022 PCP: Yolanda Manges, DO   LOS: 13 days   Brief Narrative / Interim history: 82 year old with past medical history significant for Alzheimer's dementia, hypertension, hyperlipidemia, permanent A-fib on Eliquis admitted with failure to thrive, hypernatremia with a sodium of 166, poor oral intake.  Workup revealed UTI ESBL.  Patient treated with IV Ertapenem.  Currently awaiting placement.   Subjective / 24h Interval events: No overnight events, she appears comfortable.  She does not interact much  Assesement and Plan: Principal Problem:   Hypernatremia Active Problems:   GERD (gastroesophageal reflux disease)   Leukocytosis   Thrombocytosis   Hypokalemia   Hypoalbuminemia due to protein-calorie malnutrition (HCC)   Transaminitis   AKI (acute kidney injury) (HCC)   Permanent atrial fibrillation (HCC)   Iron deficiency anemia   Alzheimer's dementia (HCC)   Insomnia   Failure to thrive (child)   Principal problem Hypernatremia - Presented  with a sodium level of 167, likely in the setting of poor oral intake and hypovolemia. Resolved with IV fluids, sodium now normalized.  Hold further fluids, allow p.o. intake and see how she does.  Hyponatremia confers a very poor prognosis in the setting of dementia, palliative care consulted, however family not ready for hospice -Sodium has stabilized this morning, if she is fed she seems to have adequate p.o. and fluid intake  Active problems UTI, E. coli ESBL -patient was diagnosed with a ESBL E. coli, speciated 08/30/2022.  She was started on ertapenem on 12/30, status post 5 days of antibiotics.  Remains afebrile  Thrombocytosis -In the setting of infection.  Resolved   Hypokalemia -Replaced.   AKI -Present with a creatinine of 1.1.  Baseline 0.7, in the setting of hypovolemia, resolved IV fluids.  Creatinine normalized and has remained stable    Transaminases -Resolved.   Hypoalbuminemia - In the setting  of poor oral intake   Iron deficiency anemia - Hemoglobin was stable last time was checked   Permanent A-fib -Continue with Eliquis, continue flecainide, continue to hold metoprolol in the setting of soft blood pressures   Insomnia -Continue with melatonin   Alzheimer's dementia - Delirium precaution.   Disposition-Per Child psychotherapist, she cannot return to her previous SNF due to financial issues, they will also need to pay 30 days upfront which spouses is unable to do so.  There is no family to assist her at home.  Difficult to place  Scheduled Meds:  apixaban  5 mg Oral BID   feeding supplement  237 mL Oral TID BM   ferrous sulfate  325 mg Oral QODAY   flecainide  50 mg Oral BID   pantoprazole  40 mg Oral Daily   potassium chloride  40 mEq Oral Once   Continuous Infusions:   PRN Meds:.acetaminophen **OR** acetaminophen, melatonin, ondansetron **OR** ondansetron (ZOFRAN) IV  Current Outpatient Medications  Medication Instructions   acetaminophen (TYLENOL) 650 mg, Oral, Every 6 hours PRN   apixaban (ELIQUIS) 5 MG TABS tablet Take 1 tablet by mouth twice daily   Cholecalciferol (VITAMIN D-3 PO) 1 capsule, Oral, Daily   Ensure (ENSURE) 237 mLs, Oral, 2 times daily, Vanilla   flecainide (TAMBOCOR) 50 MG tablet TAKE 1 TABLET BY MOUTH TWICE DAILY. APPOINTMENT REQUIRED FOR FUTURE REFILLS   melatonin 5 mg, Oral, At bedtime PRN   metoprolol succinate (TOPROL-XL) 12.5 mg, Oral, Daily   omeprazole (PRILOSEC) 40 mg, Oral, Daily   polyethylene glycol (MIRALAX /  GLYCOLAX) 17 g, Oral, Daily PRN   traZODone (DESYREL) 50 mg, Oral, Daily at bedtime    Diet Orders (From admission, onward)     Start     Ordered   08/29/22 1653  DIET DYS 3 Room service appropriate? Yes; Fluid consistency: Thin  Diet effective now       Question Answer Comment  Room service appropriate? Yes   Fluid consistency: Thin      08/29/22 1652             DVT prophylaxis: SCDs Start: 08/25/22 0659 apixaban (ELIQUIS) tablet 5 mg   Lab Results  Component Value Date   PLT 336 09/08/2022      Code Status: DNR  Family Communication: no family at bedside  Status is: Inpatient  Remains inpatient appropriate because: severity of illness  Level of care: Med-Surg  Consultants:  Palliative  Objective: Vitals:   09/07/22 1523 09/07/22 1947 09/08/22 0521 09/08/22 0719  BP: 132/83 138/88 114/77 134/74  Pulse: (!) 104  (!) 105 (!) 110  Resp: 14 18 18 16   Temp: 98.3 F (36.8 C) 98.3 F (36.8 C) 98.5 F (36.9 C) 99 F (37.2 C)  TempSrc: Oral Oral Oral Oral  SpO2: 100% 100% 97% 98%  Weight:      Height:        Intake/Output Summary (Last 24 hours) at 09/08/2022 0935 Last data filed at 09/07/2022 1700 Gross per 24 hour  Intake 450 ml  Output --  Net 450 ml    Wt Readings from Last 3 Encounters:  08/30/22 50.2 kg  07/03/22 50.2 kg  04/24/22 54.4 kg    Examination:  Constitutional: NAD Respiratory: CTA Cardiovascular: RRR  Data Reviewed: I have independently reviewed following labs and imaging studies   CBC Recent Labs  Lab 09/04/22 0256 09/08/22 0243  WBC 7.4 8.6  HGB 9.4* 8.9*  HCT 29.6* 27.5*  PLT 249 336  MCV 78.9* 77.5*  MCH 25.1* 25.1*  MCHC 31.8 32.4  RDW 15.5 15.8*     Recent Labs  Lab 09/01/22 1005 09/01/22 1805 09/02/22 0246 09/02/22 1059 09/03/22 0226 09/04/22 0256 09/05/22 0545 09/06/22 0313 09/08/22 0243  NA 139 135 140 137 136 138 136 136 136  K 4.9 3.7 3.7 3.8 3.5 3.1* 3.8 3.9 3.5  CL 114* 104 106 106 104 101 103 104 101  CO2 18* 26 26 24 23 27 25 25 25   GLUCOSE 84 94 95 105* 94 79 88 97 98  BUN 8 8 7* 5* 5* <5* 8 6* 5*  CREATININE 0.55 0.60 0.50 0.46 0.43* 0.53 0.46 0.53 0.51  CALCIUM 8.3* 8.0* 8.2* 8.4* 8.3* 8.4* 8.3* 8.2* 8.6*  AST  --   --   --   --   --  22  --   --   --   ALT  --   --   --   --   --  21  --   --   --   ALKPHOS  --   --   --   --   --  67  --   --    --   BILITOT  --   --   --   --   --  0.6  --   --   --   ALBUMIN 2.3* 2.2* 2.2* 2.4*  --  2.2*  --   --   --   MG  --   --   --   --  1.8 1.8  --   --   --      ------------------------------------------------------------------------------------------------------------------ No results for input(s): "CHOL", "HDL", "LDLCALC", "TRIG", "CHOLHDL", "LDLDIRECT" in the last 72 hours.  Lab Results  Component Value Date   HGBA1C 5.3 04/04/2020   ------------------------------------------------------------------------------------------------------------------ No results for input(s): "TSH", "T4TOTAL", "T3FREE", "THYROIDAB" in the last 72 hours.  Invalid input(s): "FREET3"  Cardiac Enzymes No results for input(s): "CKMB", "TROPONINI", "MYOGLOBIN" in the last 168 hours.  Invalid input(s): "CK" ------------------------------------------------------------------------------------------------------------------    Component Value Date/Time   BNP 73.3 10/09/2019 1035    CBG: Recent Labs  Lab 09/03/22 2214  GLUCAP 72     Recent Results (from the past 240 hour(s))  Culture, blood (Routine X 2) w Reflex to ID Panel     Status: None   Collection Time: 08/29/22  7:29 PM   Specimen: BLOOD RIGHT ARM  Result Value Ref Range Status   Specimen Description BLOOD RIGHT ARM  Final   Special Requests   Final    BOTTLES DRAWN AEROBIC AND ANAEROBIC Blood Culture adequate volume   Culture   Final    NO GROWTH 5 DAYS Performed at [facility], [address], [place], [place] [address]    Report Status 09/03/2022 FINAL  Final  Culture, blood (Routine X 2) w Reflex to ID Panel     Status: None   Collection Time: 08/29/22  7:30 PM   Specimen: BLOOD RIGHT ARM  Result Value Ref Range Status   Specimen Description BLOOD RIGHT ARM  Final   Special Requests   Final    BOTTLES DRAWN AEROBIC AND ANAEROBIC Blood Culture adequate volume   Culture   Final    NO GROWTH 5 DAYS Performed at [facility], [place] [address] [place], [place] [address]    Report Status 09/03/2022 FINAL  Final     Radiology Studies: No results found.   Marzetta Board, MD, PhD [facility]  Between 7 am - 7 pm I am available, please contact me via Amion (for emergencies) or Securechat (non urgent messages)  Between 7 pm - 7 am I am not available, please contact night coverage MD/APP via Amion

## 2022-09-08 NOTE — Progress Notes (Signed)
Palliative Medicine Progress Note   Patient Name: Donna Simmons       Date: 09/08/2022 DOB: March 25, 1941  Age: 82 y.o. MRN#: [id number] Attending Physician: Caren Griffins, MD Primary Care Physician: [name], DO Admit Date: 08/25/2022  Reason for Consultation/Follow-up: {Reason for Consult:23484}  HPI/Patient Profile: 82 y.o. female  with past medical history of Alzheimer's dementia, A-fib on Eliquis, hypertension, and hyperlipidemia.  She presented to [facility] ED on 08/25/2022 from SNF with decreased PO intake and lethargy.  Sodium on admission was 166.  She was admitted to [facility] service with hypernatremia, dehydration, and failure to thrive. Additional problems include leukocytosis, thrombocytosis, AKI, hypoalbuminemia, and transaminitis. Palliative Medicine has been consulted for goals of care    Subjective: Chart reviewed.   Objective:  Physical Exam          Vital Signs: BP 137/80 (BP Location: Left Arm)   Pulse (!) 105   Temp 98.3 F (36.8 C) (Oral)   Resp 16   Ht 5\' 2"  (1.575 m)   Wt 50.2 kg   SpO2 90%   BMI 20.24 kg/m  SpO2: SpO2: 90 % O2 Device: O2 Device: Room Air O2 Flow Rate: O2 Flow Rate (L/min): 2 L/min  Intake/output summary:  Intake/Output Summary (Last 24 hours) at 09/08/2022 1649 Last data filed at 09/07/2022 1700 Gross per 24 hour  Intake 200 ml  Output --  Net 200 ml    LBM: Last BM Date : 09/08/22     Palliative Assessment/Data: ***     Palliative Medicine Assessment & Plan   Assessment: Principal Problem:   Hypernatremia Active Problems:   GERD (gastroesophageal reflux disease)   Leukocytosis   Thrombocytosis   Hypokalemia   Hypoalbuminemia due to protein-calorie malnutrition (HCC)   Transaminitis   AKI (acute kidney injury) ([place])    Permanent atrial fibrillation (HCC)   Iron deficiency anemia   Alzheimer's dementia ([place])   Insomnia   Failure to thrive (child)    Recommendations/Plan: ***  Goals of Care and Additional Recommendations: Limitations on Scope of Treatment: {Recommended Scope and Preferences:21019}  Code Status:   Prognosis:  {Palliative Care Prognosis:23504}  Discharge Planning: {Palliative dispostion:23505}  Care plan was discussed with ***  Thank you for allowing the Palliative Medicine Team to assist in the care of this patient.   ***  Lavena Bullion, NP   Please contact Palliative Medicine Team phone at [phone] for questions and concerns.  For individual providers, please see AMION.

## 2022-09-09 DIAGNOSIS — E870 Hyperosmolality and hypernatremia: Secondary | ICD-10-CM | POA: Diagnosis not present

## 2022-09-09 LAB — BASIC METABOLIC PANEL
Anion gap: 12 (ref 5–15)
BUN: 6 mg/dL — ABNORMAL LOW (ref 8–23)
CO2: 27 mmol/L (ref 22–32)
Calcium: 8.6 mg/dL — ABNORMAL LOW (ref 8.9–10.3)
Chloride: 101 mmol/L (ref 98–111)
Creatinine, Ser: 0.57 mg/dL (ref 0.44–1.00)
GFR, Estimated: >60 mL/min (ref >60)
Glucose, Bld: 99 mg/dL (ref 70–99)
Potassium: 3.6 mmol/L (ref 3.5–5.1)
Sodium: 140 mmol/L (ref 135–145)

## 2022-09-09 LAB — MAGNESIUM: Magnesium: 2.2 mg/dL (ref 1.7–2.4)

## 2022-09-09 MED ORDER — ADULT MULTIVITAMIN W/MINERALS CH
1.0000 | ORAL_TABLET | Freq: Every day | ORAL | Status: DC
  Administered 2022-09-09 – 2022-12-19 (×99): 1 via ORAL
  Filled 2022-09-09 (×101): qty 1

## 2022-09-09 NOTE — Progress Notes (Signed)
Initial Nutrition Assessment  DOCUMENTATION CODES:   Not applicable  INTERVENTION:   Continue Ensure Enlive po TID, each supplement provides 350 kcal and 20 grams of protein. Staff to assist with feeding pt Multivitamin w/ minerals daily  NUTRITION DIAGNOSIS:   Inadequate oral intake related to poor appetite as evidenced by meal completion < 25%.  GOAL:   Patient will meet greater than or equal to 90% of their needs  MONITOR:   PO intake, Supplement acceptance, Weight trends, Labs  REASON FOR ASSESSMENT:   Consult Assessment of nutrition requirement/status  ASSESSMENT:   82 y.o. female presented to the ED with failure to thrive from SNF, recent decreased PO intake and lethargy. PMH includes Alzheimer's dementia, GERD, HTN, and A. Fib. Pt admitted with hypernatremia, AKI, dehydration, and hypokalemia.   12/25 - Admitted  Per MD notes, pt continues to not interact much. Pt as accepted majority of the Ensure's offered over the past 7 days. Discussed with RN, RN reports that pt took only bites of food but finished first Ensure.   Pt evaluated by RD at previous admission in November 2023. During that admission, pt met criteria for severe malnutrition based on NFPE. RD suspects that pt still with malnutrition, although RD unable to diagnosis at this time due to inability to perform physical exam.   Meal Intake 01/02-01/08: 3-70% x 8 meals (average 22%)  Medications reviewed and include: Ferrous Sulfate, Protonix Labs reviewed.   NUTRITION - FOCUSED PHYSICAL EXAM:  Deferred to follow-up due to RD working remotely.   Diet Order:   Diet Order             DIET DYS 3 Room service appropriate? Yes; Fluid consistency: Thin  Diet effective now                   EDUCATION NEEDS:   Not appropriate for education at this time  Skin:  Skin Assessment: Skin Integrity Issues: Skin Integrity Issues:: Stage II Stage II: Sacrum  Last BM:  1/8  Height:   Ht Readings  from Last 1 Encounters:  08/30/22 5\' 2"  (1.575 m)    Weight:   Wt Readings from Last 1 Encounters:  08/30/22 50.2 kg    Ideal Body Weight:  50 kg  BMI:  Body mass index is 20.24 kg/m.  Estimated Nutritional Needs:   Kcal:  1500-1700  Protein:  75-90 grams  Fluid:  >/= 1.5 L    Hermina Barters RD, LDN Clinical Dietitian See [facility] for contact information.

## 2022-09-09 NOTE — Progress Notes (Signed)
No PROGRESS NOTE  Donna Simmons JJO:[id number] DOB: [birth year] DOA: 08/25/2022 PCP: [name], DO   LOS: 14 days   Brief Narrative / Interim history: 82 year old with past medical history significant for Alzheimer's dementia, hypertension, hyperlipidemia, permanent A-fib on Eliquis admitted with failure to thrive, hypernatremia with a sodium of 166, poor oral intake.  Workup revealed UTI ESBL.  Patient treated with IV Ertapenem.  Currently awaiting placement.   Subjective / 24h Interval events: No overnight events, appears comfortable.  Does not interact much  Assesement and Plan: Principal Problem:   Hypernatremia Active Problems:   GERD (gastroesophageal reflux disease)   Leukocytosis   Thrombocytosis   Hypokalemia   Hypoalbuminemia due to protein-calorie malnutrition (HCC)   Transaminitis   AKI (acute kidney injury) ([place])   Permanent atrial fibrillation (HCC)   Iron deficiency anemia   Alzheimer's dementia ([name])   Insomnia   Failure to thrive (child)   Principal problem Hypernatremia - Presented  with a sodium level of 167, likely in the setting of poor oral intake and hypovolemia. Resolved with IV fluids, sodium now normalized.  Hold further fluids, allow p.o. intake and see how she does.  Hyponatremia confers a very poor prognosis in the setting of dementia, palliative care consulted, however family not ready for hospice -Sodium has stabilized this morning, if she is fed she seems to have adequate p.o. and fluid intake  Active problems UTI, E. coli ESBL -patient was diagnosed with a ESBL E. coli, speciated 08/30/2022.  She was started on ertapenem on 12/30, status post 5 days of antibiotics.  Remains afebrile  Thrombocytosis -In the setting of infection.  Resolved   Hypokalemia -Replaced.   AKI -Present with a creatinine of 1.1.  Baseline 0.7, in the setting of hypovolemia, resolved IV fluids.  Creatinine normalized and has remained stable   Transaminases  -Resolved.   Hypoalbuminemia - In the setting  of poor oral intake   Iron deficiency anemia - Hemoglobin was stable last time was checked   Permanent A-fib -Continue with Eliquis, continue flecainide, continue to hold metoprolol in the setting of soft blood pressures   Insomnia -Continue with melatonin   Alzheimer's dementia - Delirium precaution.   Disposition-Per Education officer, museum, she cannot return to her previous SNF due to financial issues, they will also need to pay 30 days upfront which spouses is unable to do so.  There is no family to assist her at home.  Difficult to place, Medicaid application pending  Scheduled Meds:  apixaban  5 mg Oral BID   feeding supplement  237 mL Oral TID BM   ferrous sulfate  325 mg Oral QODAY   flecainide  50 mg Oral BID   multivitamin with minerals  1 tablet Oral Daily   pantoprazole  40 mg Oral Daily   Continuous Infusions:   PRN Meds:.acetaminophen **OR** acetaminophen, melatonin, ondansetron **OR** ondansetron (ZOFRAN) IV  Current Outpatient Medications  Medication Instructions   acetaminophen (TYLENOL) 650 mg, Oral, Every 6 hours PRN   apixaban (ELIQUIS) 5 MG TABS tablet Take 1 tablet by mouth twice daily   Cholecalciferol (VITAMIN D-3 PO) 1 capsule, Oral, Daily   Ensure (ENSURE) 237 mLs, Oral, 2 times daily, Vanilla   flecainide (TAMBOCOR) 50 MG tablet TAKE 1 TABLET BY MOUTH TWICE DAILY. APPOINTMENT REQUIRED FOR FUTURE REFILLS   melatonin 5 mg, Oral, At bedtime PRN   metoprolol succinate (TOPROL-XL) 12.5 mg, Oral, Daily   omeprazole (PRILOSEC) 40 mg, Oral, Daily   polyethylene  glycol (MIRALAX / GLYCOLAX) 17 g, Oral, Daily PRN   traZODone (DESYREL) 50 mg, Oral, Daily at bedtime    Diet Orders (From admission, onward)     Start     Ordered   08/29/22 1653  DIET DYS 3 Room service appropriate? Yes; Fluid consistency: Thin  Diet effective now       Question Answer Comment  Room service appropriate? Yes   Fluid consistency: Thin       08/29/22 1652            DVT prophylaxis: SCDs Start: 08/25/22 0659 apixaban (ELIQUIS) tablet 5 mg   Lab Results  Component Value Date   PLT 336 09/08/2022      Code Status: DNR  Family Communication: no family at bedside  Status is: Inpatient  Remains inpatient appropriate because: severity of illness  Level of care: Med-Surg  Consultants:  Palliative  Objective: Vitals:   09/08/22 1608 09/08/22 1943 09/09/22 0336 09/09/22 0817  BP: 137/80 103/71 105/65 119/84  Pulse: (!) 105 63 96 (!) 105  Resp: 16 16 18 17   Temp: 98.3 F (36.8 C) 99.3 F (37.4 C) 98.2 F (36.8 C) 98.5 F (36.9 C)  TempSrc: Oral Oral Oral Oral  SpO2: 90% 99% 99% 99%  Weight:      Height:        Intake/Output Summary (Last 24 hours) at 09/09/2022 1118 Last data filed at 09/09/2022 0900 Gross per 24 hour  Intake 355 ml  Output --  Net 355 ml    Wt Readings from Last 3 Encounters:  08/30/22 50.2 kg  07/03/22 50.2 kg  04/24/22 54.4 kg    Examination:  Constitutional: NAD Respiratory: CTA Cardiovascular: RRR  Data Reviewed: I have independently reviewed following labs and imaging studies   CBC Recent Labs  Lab 09/04/22 0256 09/08/22 0243  WBC 7.4 8.6  HGB 9.4* 8.9*  HCT 29.6* 27.5*  PLT 249 336  MCV 78.9* 77.5*  MCH 25.1* 25.1*  MCHC 31.8 32.4  RDW 15.5 15.8*     Recent Labs  Lab 09/03/22 0226 09/04/22 0256 09/05/22 0545 09/06/22 0313 09/08/22 0243 09/09/22 0302  NA 136 138 136 136 136 140  K 3.5 3.1* 3.8 3.9 3.5 3.6  CL 104 101 103 104 101 101  CO2 23 27 25 25 25 27   GLUCOSE 94 79 88 97 98 99  BUN 5* <5* 8 6* 5* 6*  CREATININE 0.43* 0.53 0.46 0.53 0.51 0.57  CALCIUM 8.3* 8.4* 8.3* 8.2* 8.6* 8.6*  AST  --  22  --   --   --   --   ALT  --  21  --   --   --   --   ALKPHOS  --  67  --   --   --   --   BILITOT  --  0.6  --   --   --   --   ALBUMIN  --  2.2*  --   --   --   --   MG 1.8 1.8  --   --   --  2.2      ------------------------------------------------------------------------------------------------------------------ No results for input(s): "CHOL", "HDL", "LDLCALC", "TRIG", "CHOLHDL", "LDLDIRECT" in the last 72 hours.  Lab Results  Component Value Date   HGBA1C 5.3 04/04/2020   ------------------------------------------------------------------------------------------------------------------ No results for input(s): "TSH", "T4TOTAL", "T3FREE", "THYROIDAB" in the last 72 hours.  Invalid input(s): "FREET3"  Cardiac Enzymes No results for input(s): "CKMB", "TROPONINI", "MYOGLOBIN" in  the last 168 hours.  Invalid input(s): "CK" ------------------------------------------------------------------------------------------------------------------    Component Value Date/Time   BNP 73.3 10/09/2019 1035    CBG: Recent Labs  Lab 09/03/22 2214  GLUCAP 72     No results found for this or any previous visit (from the past 240 hour(s)).    Radiology Studies: No results found.   Pamella Pert, MD, PhD [facility]  Between 7 am - 7 pm I am available, please contact me via Amion (for emergencies) or Securechat (non urgent messages)  Between 7 pm - 7 am I am not available, please contact night coverage MD/APP via Amion

## 2022-09-10 DIAGNOSIS — F02C0 Dementia in other diseases classified elsewhere, severe, without behavioral disturbance, psychotic disturbance, mood disturbance, and anxiety: Secondary | ICD-10-CM

## 2022-09-10 DIAGNOSIS — G309 Alzheimer's disease, unspecified: Secondary | ICD-10-CM | POA: Diagnosis not present

## 2022-09-10 DIAGNOSIS — Z515 Encounter for palliative care: Secondary | ICD-10-CM | POA: Diagnosis not present

## 2022-09-10 DIAGNOSIS — R6251 Failure to thrive (child): Secondary | ICD-10-CM | POA: Diagnosis not present

## 2022-09-10 DIAGNOSIS — E870 Hyperosmolality and hypernatremia: Secondary | ICD-10-CM | POA: Diagnosis not present

## 2022-09-10 DIAGNOSIS — Z7189 Other specified counseling: Secondary | ICD-10-CM

## 2022-09-10 DIAGNOSIS — N179 Acute kidney failure, unspecified: Secondary | ICD-10-CM | POA: Diagnosis not present

## 2022-09-10 LAB — CBC
HCT: 27.2 % — ABNORMAL LOW (ref 36.0–46.0)
Hemoglobin: 8.7 g/dL — ABNORMAL LOW (ref 12.0–15.0)
MCH: 24.7 pg — ABNORMAL LOW (ref 26.0–34.0)
MCHC: 32 g/dL (ref 30.0–36.0)
MCV: 77.3 fL — ABNORMAL LOW (ref 80.0–100.0)
Platelets: 358 10*3/uL (ref 150–400)
RBC: 3.52 MIL/uL — ABNORMAL LOW (ref 3.87–5.11)
RDW: 16 % — ABNORMAL HIGH (ref 11.5–15.5)
WBC: 7.2 10*3/uL (ref 4.0–10.5)
nRBC: 0 % (ref 0.0–0.2)

## 2022-09-10 LAB — COMPREHENSIVE METABOLIC PANEL
ALT: 22 U/L (ref 0–44)
AST: 26 U/L (ref 15–41)
Albumin: 2.2 g/dL — ABNORMAL LOW (ref 3.5–5.0)
Alkaline Phosphatase: 74 U/L (ref 38–126)
Anion gap: 8 (ref 5–15)
BUN: 11 mg/dL (ref 8–23)
CO2: 28 mmol/L (ref 22–32)
Calcium: 8.4 mg/dL — ABNORMAL LOW (ref 8.9–10.3)
Chloride: 104 mmol/L (ref 98–111)
Creatinine, Ser: 0.57 mg/dL (ref 0.44–1.00)
GFR, Estimated: >60 mL/min (ref >60)
Glucose, Bld: 102 mg/dL — ABNORMAL HIGH (ref 70–99)
Potassium: 3.6 mmol/L (ref 3.5–5.1)
Sodium: 140 mmol/L (ref 135–145)
Total Bilirubin: 0.6 mg/dL (ref 0.3–1.2)
Total Protein: 6.3 g/dL — ABNORMAL LOW (ref 6.5–8.1)

## 2022-09-10 LAB — MAGNESIUM: Magnesium: 2.1 mg/dL (ref 1.7–2.4)

## 2022-09-10 NOTE — Social Work (Signed)
Previously documented barriers remain as well as pt's spouse is still currently hospitalized as well. He is unable to move forward with process while inpatient. CSW will follow for when spouse discharges. TOC leadership indicated they would provide an LOG if needed. TOC will continue to follow.

## 2022-09-10 NOTE — Plan of Care (Signed)
  Problem: Education: Goal: Knowledge of General Education information will improve Description: Including pain rating scale, medication(s)/side effects and non-pharmacologic comfort measures 09/10/2022 0340 by Suzy Bouchard, RN Outcome: Progressing 09/10/2022 0340 by Suzy Bouchard, RN Outcome: Progressing   Problem: Activity: Goal: Risk for activity intolerance will decrease Outcome: Progressing   Problem: Coping: Goal: Level of anxiety will decrease Outcome: Progressing   Problem: Pain Managment: Goal: General experience of comfort will improve Outcome: Progressing   Problem: Safety: Goal: Ability to remain free from injury will improve Outcome: Progressing

## 2022-09-10 NOTE — Progress Notes (Signed)
PROGRESS NOTE    LEVITA MONICAL  FBP:[id number] DOB: 05-19-1941 DOA: 08/25/2022 PCP: [name], DO   Brief Narrative:  82 year old with past medical history significant for Alzheimer's dementia, hypertension, hyperlipidemia, permanent A-fib on Eliquis admitted with failure to thrive, hypernatremia with a sodium of 166, poor oral intake.  Workup revealed UTI ESBL.  Patient treated with IV Ertapenem.  Currently awaiting placement.    Assessment & Plan:   Hypernatremia  - Presented  with a sodium level of 167, likely in the setting of poor oral intake and hypovolemia. Resolved with IV fluids, sodium now normalized.  Hold further fluids, allow p.o. intake and see how she does.  Hyponatremia confers a very poor prognosis in the setting of dementia, palliative care consulted, however family not ready for hospice -Sodium 140 this morning  UTI, E. coli ESBL  -status post 5 days of ertapenem.  Remains afebrile   Thrombocytosis -In the setting of infection.  Resolved   Hypokalemia -Replaced.  Improved   AKI -Present with a creatinine of 1.1.  Baseline 0.7, in the setting of hypovolemia, resolved IV fluids.  Creatinine normalized and has remained stable   Transaminitis-Resolved.   Hypoalbuminemia - In the setting  of poor oral intake.  Follow nutrition recommendations   Iron deficiency anemia - Hemoglobin currently stable.  Monitor intermittently.  Permanent A-fib -Continue with Eliquis, continue flecainide, continue to hold metoprolol in the setting of soft blood pressures   Insomnia -Continue with melatonin   Alzheimer's dementia - Delirium precaution.  -Palliative care evaluation appreciated: Family not ready for patient to enroll in hospice yet   Disposition-Per social worker, she cannot return to her previous SNF due to financial issues, they will also need to pay 30 days upfront which spouse is unable to do so.  There is no family to assist her at home.  Difficult to place,  Medicaid application pending -Currently medically stable for discharge  DVT prophylaxis: Eliquis Code Status: DNR Family Communication: None at bedside Disposition Plan: Status is: Inpatient Remains inpatient appropriate because: of need for SNF placement  Consultants: Palliative care  Procedures: None  Antimicrobials:  Anti-infectives (From admission, onward)    Start     Dose/Rate Route Frequency Ordered Stop   09/03/22 1000  ertapenem (INVANZ) 1,000 mg in sodium chloride 0.9 % 100 mL IVPB        1 g 200 mL/hr over 30 Minutes Intravenous Every 24 hours 09/02/22 1421 09/04/22 2048   08/30/22 1500  ertapenem (INVANZ) 1,000 mg in sodium chloride 0.9 % 100 mL IVPB  Status:  Discontinued        1 g 200 mL/hr over 30 Minutes Intravenous Every 24 hours 08/30/22 1419 09/02/22 1421   08/29/22 1115  cefTRIAXone (ROCEPHIN) 1 g in sodium chloride 0.9 % 100 mL IVPB  Status:  Discontinued        1 g 200 mL/hr over 30 Minutes Intravenous Every 24 hours 08/29/22 1017 08/30/22 1419        Subjective: Patient seen and examined at bedside.  Poor historian, confused.  No fever, seizures, agitation reported.  Objective: Vitals:   09/09/22 0817 09/09/22 1705 09/09/22 2201 09/10/22 0826  BP: 119/84 110/74 110/70 104/69  Pulse: (!) 105 98 (!) 107 90  Resp: 17 16  16   Temp: 98.5 F (36.9 C) 98 F (36.7 C) 98.5 F (36.9 C) 98.5 F (36.9 C)  TempSrc: Oral Oral Oral Oral  SpO2: 99% 95% 100% 98%  Weight:  Height:        Intake/Output Summary (Last 24 hours) at 09/10/2022 1050 Last data filed at 09/09/2022 1348 Gross per 24 hour  Intake 300 ml  Output --  Net 300 ml   Filed Weights   08/30/22 1300  Weight: 50.2 kg    Examination:  General exam: Appears calm and comfortable.  On room air.  No surgical in the condition.  Confused.  Slow to respond.  Poor historian. Respiratory system: Bilateral decreased breath sounds at bases with scattered crackles Cardiovascular system: S1 &  S2 heard, Rate controlled currently Gastrointestinal system: Abdomen is nondistended, soft and nontender. Normal bowel sounds heard. Extremities: No cyanosis, clubbing, edema   Data Reviewed: I have personally reviewed following labs and imaging studies  CBC: Recent Labs  Lab 09/04/22 0256 09/08/22 0243 09/10/22 0217  WBC 7.4 8.6 7.2  HGB 9.4* 8.9* 8.7*  HCT 29.6* 27.5* 27.2*  MCV 78.9* 77.5* 77.3*  PLT 249 336 151   Basic Metabolic Panel: Recent Labs  Lab 09/04/22 0256 09/05/22 0545 09/06/22 0313 09/08/22 0243 09/09/22 0302 09/10/22 0217  NA 138 136 136 136 140 140  K 3.1* 3.8 3.9 3.5 3.6 3.6  CL 101 103 104 101 101 104  CO2 27 25 25 25 27 28   GLUCOSE 79 88 97 98 99 102*  BUN <5* 8 6* 5* 6* 11  CREATININE 0.53 0.46 0.53 0.51 0.57 0.57  CALCIUM 8.4* 8.3* 8.2* 8.6* 8.6* 8.4*  MG 1.8  --   --   --  2.2 2.1   GFR: Estimated Creatinine Clearance: 45.1 mL/min (by C-G formula based on SCr of 0.57 mg/dL). Liver Function Tests: Recent Labs  Lab 09/04/22 0256 09/10/22 0217  AST 22 26  ALT 21 22  ALKPHOS 67 74  BILITOT 0.6 0.6  PROT 5.8* 6.3*  ALBUMIN 2.2* 2.2*   No results for input(s): "LIPASE", "AMYLASE" in the last 168 hours. No results for input(s): "AMMONIA" in the last 168 hours. Coagulation Profile: No results for input(s): "INR", "PROTIME" in the last 168 hours. Cardiac Enzymes: No results for input(s): "CKTOTAL", "CKMB", "CKMBINDEX", "TROPONINI" in the last 168 hours. BNP (last 3 results) No results for input(s): "PROBNP" in the last 8760 hours. HbA1C: No results for input(s): "HGBA1C" in the last 72 hours. CBG: Recent Labs  Lab 09/03/22 2214  GLUCAP 72   Lipid Profile: No results for input(s): "CHOL", "HDL", "LDLCALC", "TRIG", "CHOLHDL", "LDLDIRECT" in the last 72 hours. Thyroid Function Tests: No results for input(s): "TSH", "T4TOTAL", "FREET4", "T3FREE", "THYROIDAB" in the last 72 hours. Anemia Panel: No results for input(s): "VITAMINB12",  "FOLATE", "FERRITIN", "TIBC", "IRON", "RETICCTPCT" in the last 72 hours. Sepsis Labs: No results for input(s): "PROCALCITON", "LATICACIDVEN" in the last 168 hours.  No results found for this or any previous visit (from the past 240 hour(s)).       Radiology Studies: No results found.      Scheduled Meds:  apixaban  5 mg Oral BID   feeding supplement  237 mL Oral TID BM   ferrous sulfate  325 mg Oral QODAY   flecainide  50 mg Oral BID   multivitamin with minerals  1 tablet Oral Daily   pantoprazole  40 mg Oral Daily   Continuous Infusions:        Aline August, MD [facility] 09/10/2022, 10:50 AM

## 2022-09-10 NOTE — Plan of Care (Signed)
  Problem: Education: Goal: Knowledge of General Education information will improve Description: Including pain rating scale, medication(s)/side effects and non-pharmacologic comfort measures Outcome: Progressing   Problem: Coping: Goal: Level of anxiety will decrease Outcome: Progressing   Problem: Pain Managment: Goal: General experience of comfort will improve Outcome: Progressing   

## 2022-09-11 DIAGNOSIS — N179 Acute kidney failure, unspecified: Secondary | ICD-10-CM | POA: Diagnosis not present

## 2022-09-11 DIAGNOSIS — E870 Hyperosmolality and hypernatremia: Secondary | ICD-10-CM | POA: Diagnosis not present

## 2022-09-11 DIAGNOSIS — G309 Alzheimer's disease, unspecified: Secondary | ICD-10-CM | POA: Diagnosis not present

## 2022-09-11 DIAGNOSIS — F02C0 Dementia in other diseases classified elsewhere, severe, without behavioral disturbance, psychotic disturbance, mood disturbance, and anxiety: Secondary | ICD-10-CM | POA: Diagnosis not present

## 2022-09-11 NOTE — Progress Notes (Signed)
PROGRESS NOTE    Donna Simmons  SWF:[id number] DOB: [birth year] DOA: 08/25/2022 PCP: [name], DO   Brief Narrative:  82 year old with past medical history significant for Alzheimer's dementia, hypertension, hyperlipidemia, permanent A-fib on Eliquis admitted with failure to thrive, hypernatremia with a sodium of 166, poor oral intake.  Workup revealed UTI ESBL.  Patient treated with IV Ertapenem.  Currently awaiting placement.    Assessment & Plan:   Hypernatremia  - Presented  with a sodium level of 167, likely in the setting of poor oral intake and hypovolemia. Resolved with IV fluids, sodium now normalized.  Hold further fluids, allow p.o. intake and see how she does.  Hyponatremia confers a very poor prognosis in the setting of dementia, palliative care consulted, however family not ready for hospice -Sodium 140 on 09/10/2022.  Monitor intermittently.  UTI, E. coli ESBL  -status post 5 days of ertapenem.  Remains afebrile   Thrombocytosis -In the setting of infection.  Resolved   Hypokalemia -Replaced.  Improved   AKI -Present with a creatinine of 1.1.  Baseline 0.7, in the setting of hypovolemia, resolved IV fluids.  Creatinine normalized and has remained stable.  Monitor intermittently.   Transaminitis-Resolved.   Hypoalbuminemia - In the setting  of poor oral intake.  Follow nutrition recommendations   Iron deficiency anemia - Hemoglobin currently stable.  Monitor intermittently.  Permanent A-fib -Continue with Eliquis, continue flecainide, continue to hold metoprolol in the setting of soft blood pressures   Insomnia -Continue with melatonin   Alzheimer's dementia - Delirium precaution.  -Palliative care evaluation appreciated: Family not ready for patient to enroll in hospice yet   Disposition-Per social worker, she cannot return to her previous SNF due to financial issues, they will also need to pay 30 days upfront which spouse is unable to do so.  There is no  family to assist her at home.  Difficult to place, Medicaid application pending -Currently medically stable for discharge  DVT prophylaxis: Eliquis Code Status: DNR Family Communication: None at bedside Disposition Plan: Status is: Inpatient Remains inpatient appropriate because: of need for SNF placement  Consultants: Palliative care  Procedures: None  Antimicrobials:  Anti-infectives (From admission, onward)    Start     Dose/Rate Route Frequency Ordered Stop   09/03/22 1000  ertapenem (INVANZ) 1,000 mg in sodium chloride 0.9 % 100 mL IVPB        1 g 200 mL/hr over 30 Minutes Intravenous Every 24 hours 09/02/22 1421 09/04/22 2048   08/30/22 1500  ertapenem (INVANZ) 1,000 mg in sodium chloride 0.9 % 100 mL IVPB  Status:  Discontinued        1 g 200 mL/hr over 30 Minutes Intravenous Every 24 hours 08/30/22 1419 09/02/22 1421   08/29/22 1115  cefTRIAXone (ROCEPHIN) 1 g in sodium chloride 0.9 % 100 mL IVPB  Status:  Discontinued        1 g 200 mL/hr over 30 Minutes Intravenous Every 24 hours 08/29/22 1017 08/30/22 1419        Subjective: Patient seen and examined at bedside.  Poor historian, confused.  No agitation, seizures, fever or vomiting reported. Objective: Vitals:   09/10/22 0826 09/10/22 1628 09/10/22 2045 09/11/22 0441  BP: 104/69 108/61 120/62 99/61  Pulse: 90 (!) 105 (!) 107 97  Resp: 16 15 16 16   Temp: 98.5 F (36.9 C) 97.9 F (36.6 C) 97.6 F (36.4 C) 98 F (36.7 C)  TempSrc: Oral  Axillary Oral  SpO2:  98% 94% 92% 99%  Weight:      Height:        Intake/Output Summary (Last 24 hours) at 09/11/2022 0809 Last data filed at 09/10/2022 1530 Gross per 24 hour  Intake 240 ml  Output --  Net 240 ml    Filed Weights   08/30/22 1300  Weight: 50.2 kg    Examination:  General exam: No distress.  On room air currently.  Chronically ill and deconditioned looking.  Still confused.  Slow to respond.  Poor historian. Respiratory system: Decreased breath  sounds at bases bilaterally with some crackles  cardiovascular system: Rate controlled; S1 and S2 are heard Gastrointestinal system: Abdomen is distended slightly, soft and nontender. bowel sounds heard. Extremities: No edema or clubbing  Data Reviewed: I have personally reviewed following labs and imaging studies  CBC: Recent Labs  Lab 09/08/22 0243 09/10/22 0217  WBC 8.6 7.2  HGB 8.9* 8.7*  HCT 27.5* 27.2*  MCV 77.5* 77.3*  PLT 336 570    Basic Metabolic Panel: Recent Labs  Lab 09/05/22 0545 09/06/22 0313 09/08/22 0243 09/09/22 0302 09/10/22 0217  NA 136 136 136 140 140  K 3.8 3.9 3.5 3.6 3.6  CL 103 104 101 101 104  CO2 25 25 25 27 28   GLUCOSE 88 97 98 99 102*  BUN 8 6* 5* 6* 11  CREATININE 0.46 0.53 0.51 0.57 0.57  CALCIUM 8.3* 8.2* 8.6* 8.6* 8.4*  MG  --   --   --  2.2 2.1    GFR: Estimated Creatinine Clearance: 45.1 mL/min (by C-G formula based on SCr of 0.57 mg/dL). Liver Function Tests: Recent Labs  Lab 09/10/22 0217  AST 26  ALT 22  ALKPHOS 74  BILITOT 0.6  PROT 6.3*  ALBUMIN 2.2*    No results for input(s): "LIPASE", "AMYLASE" in the last 168 hours. No results for input(s): "AMMONIA" in the last 168 hours. Coagulation Profile: No results for input(s): "INR", "PROTIME" in the last 168 hours. Cardiac Enzymes: No results for input(s): "CKTOTAL", "CKMB", "CKMBINDEX", "TROPONINI" in the last 168 hours. BNP (last 3 results) No results for input(s): "PROBNP" in the last 8760 hours. HbA1C: No results for input(s): "HGBA1C" in the last 72 hours. CBG: No results for input(s): "GLUCAP" in the last 168 hours.  Lipid Profile: No results for input(s): "CHOL", "HDL", "LDLCALC", "TRIG", "CHOLHDL", "LDLDIRECT" in the last 72 hours. Thyroid Function Tests: No results for input(s): "TSH", "T4TOTAL", "FREET4", "T3FREE", "THYROIDAB" in the last 72 hours. Anemia Panel: No results for input(s): "VITAMINB12", "FOLATE", "FERRITIN", "TIBC", "IRON", "RETICCTPCT"  in the last 72 hours. Sepsis Labs: No results for input(s): "PROCALCITON", "LATICACIDVEN" in the last 168 hours.  No results found for this or any previous visit (from the past 240 hour(s)).       Radiology Studies: No results found.      Scheduled Meds:  apixaban  5 mg Oral BID   feeding supplement  237 mL Oral TID BM   ferrous sulfate  325 mg Oral QODAY   flecainide  50 mg Oral BID   multivitamin with minerals  1 tablet Oral Daily   pantoprazole  40 mg Oral Daily   Continuous Infusions:        Aline August, MD [facility] 09/11/2022, 8:09 AM

## 2022-09-12 DIAGNOSIS — E870 Hyperosmolality and hypernatremia: Secondary | ICD-10-CM | POA: Diagnosis not present

## 2022-09-12 NOTE — Plan of Care (Signed)

## 2022-09-12 NOTE — TOC Progression Note (Signed)
Transition of Care [facility]) - Progression Note    Patient Details  Name: Donna Simmons MRN: [id number] Date of Birth: 01-12-1941  [facility] CM/SW [place], [place] Phone Number: 09/12/2022, 2:36 PM  Clinical Narrative:     [name] to return home soon, CSW to follow up on Medicaid application when appropriate. Appreciate palliatives continued discussions with family. Pt placement can be established once a pending medicaid application and DSS caseworker can be confirmed. TOC is continuing to follow.        Expected Discharge Plan and Services                                               Social Determinants of Health (SDOH) Interventions SDOH Screenings   Food Insecurity: Unknown (07/03/2022)  Alcohol Screen: Low Risk  (04/12/2019)  Depression (PHQ2-9): Low Risk  (11/05/2020)  Tobacco Use: Low Risk  (08/20/2022)    Readmission Risk Interventions     No data to display

## 2022-09-12 NOTE — Progress Notes (Signed)
PROGRESS NOTE    Donna Simmons  [id number]:[id number] DOB: Oct 30, 1940 DOA: 08/25/2022 PCP: [name], DO   Brief Narrative:  82 year old with past medical history significant for Alzheimer's dementia, hypertension, hyperlipidemia, permanent A-fib on Eliquis admitted with failure to thrive, hypernatremia with a sodium of 166, poor oral intake.  Workup revealed UTI ESBL.  Patient treated with IV Ertapenem.  Currently awaiting placement.    Assessment & Plan:   Hypernatremia  - Presented  with a sodium level of 167, likely in the setting of poor oral intake and hypovolemia. Resolved with IV fluids, sodium now normalized.  Hold further fluids, allow p.o. intake and see how she does.  Hyponatremia confers a very poor prognosis in the setting of dementia, palliative care consulted, however family not ready for hospice -Sodium 140 on 09/10/2022.  Monitor intermittently.  UTI, E. coli ESBL  -status post 5 days of ertapenem.  Remains afebrile   Thrombocytosis -In the setting of infection.  Resolved   Hypokalemia -Replaced.  Improved   AKI -Present with a creatinine of 1.1.  Baseline 0.7, in the setting of hypovolemia, resolved IV fluids.  Creatinine normalized and has remained stable.  Monitor intermittently.   Transaminitis-Resolved.   Hypoalbuminemia - In the setting  of poor oral intake.  Follow nutrition recommendations   Iron deficiency anemia - Hemoglobin currently stable.  Monitor intermittently.  Permanent A-fib -Continue with Eliquis, continue flecainide, continue to hold metoprolol in the setting of soft blood pressures   Insomnia -Continue with melatonin   Alzheimer's dementia - Delirium precaution.  -Palliative care evaluation appreciated: Family not ready for patient to enroll in hospice yet   Disposition-Per social worker, she cannot return to her previous SNF due to financial issues, they will also need to pay 30 days upfront which spouse is unable to do so.  There is no  family to assist her at home.  Difficult to place, Medicaid application pending -Currently medically stable for discharge  DVT prophylaxis: Eliquis Code Status: DNR Family Communication: None at bedside Disposition Plan: Status is: Inpatient Remains inpatient appropriate because: of need for SNF placement  Consultants: Palliative care  Procedures: None  Antimicrobials:  Anti-infectives (From admission, onward)    Start     Dose/Rate Route Frequency Ordered Stop   09/03/22 1000  ertapenem (INVANZ) 1,000 mg in sodium chloride 0.9 % 100 mL IVPB        1 g 200 mL/hr over 30 Minutes Intravenous Every 24 hours 09/02/22 1421 09/04/22 2048   08/30/22 1500  ertapenem (INVANZ) 1,000 mg in sodium chloride 0.9 % 100 mL IVPB  Status:  Discontinued        1 g 200 mL/hr over 30 Minutes Intravenous Every 24 hours 08/30/22 1419 09/02/22 1421   08/29/22 1115  cefTRIAXone (ROCEPHIN) 1 g in sodium chloride 0.9 % 100 mL IVPB  Status:  Discontinued        1 g 200 mL/hr over 30 Minutes Intravenous Every 24 hours 08/29/22 1017 08/30/22 1419        Subjective: Patient seen and examined at bedside.  Poor historian, confused.  No seizures, agitation, vomiting or fever reported.   Objective: Vitals:   09/11/22 0919 09/11/22 1517 09/11/22 2148 09/12/22 0633  BP: 111/75 (!) 95/54 (!) 92/56 (!) 109/50  Pulse: 86 83 85 100  Resp: 15 16 16 16   Temp: 97.6 F (36.4 C) 97.7 F (36.5 C) 97.6 F (36.4 C) 97.6 F (36.4 C)  TempSrc: Oral Oral Oral  Oral  SpO2:  100% 100% 100%  Weight:      Height:        Intake/Output Summary (Last 24 hours) at 09/12/2022 0801 Last data filed at 09/11/2022 2130 Gross per 24 hour  Intake 480 ml  Output --  Net 480 ml    Filed Weights   08/30/22 1300  Weight: 50.2 kg    Examination:  General exam: On room air.  No acute distress currently.  Chronically ill and deconditioned looking.  Still confused and very slow to respond.  Poor historian. Respiratory system:  Bilateral decreased breath sounds at bases with scattered crackles  cardiovascular system: S1-S2 heard; rate mostly controlled Gastrointestinal system: Abdomen is still distended; soft and nontender.  Normal bowel sounds heard  extremities: No cyanosis or clubbing  Data Reviewed: I have personally reviewed following labs and imaging studies  CBC: Recent Labs  Lab 09/08/22 0243 09/10/22 0217  WBC 8.6 7.2  HGB 8.9* 8.7*  HCT 27.5* 27.2*  MCV 77.5* 77.3*  PLT 336 858    Basic Metabolic Panel: Recent Labs  Lab 09/06/22 0313 09/08/22 0243 09/09/22 0302 09/10/22 0217  NA 136 136 140 140  K 3.9 3.5 3.6 3.6  CL 104 101 101 104  CO2 25 25 27 28   GLUCOSE 97 98 99 102*  BUN 6* 5* 6* 11  CREATININE 0.53 0.51 0.57 0.57  CALCIUM 8.2* 8.6* 8.6* 8.4*  MG  --   --  2.2 2.1    GFR: Estimated Creatinine Clearance: 45.1 mL/min (by C-G formula based on SCr of 0.57 mg/dL). Liver Function Tests: Recent Labs  Lab 09/10/22 0217  AST 26  ALT 22  ALKPHOS 74  BILITOT 0.6  PROT 6.3*  ALBUMIN 2.2*    No results for input(s): "LIPASE", "AMYLASE" in the last 168 hours. No results for input(s): "AMMONIA" in the last 168 hours. Coagulation Profile: No results for input(s): "INR", "PROTIME" in the last 168 hours. Cardiac Enzymes: No results for input(s): "CKTOTAL", "CKMB", "CKMBINDEX", "TROPONINI" in the last 168 hours. BNP (last 3 results) No results for input(s): "PROBNP" in the last 8760 hours. HbA1C: No results for input(s): "HGBA1C" in the last 72 hours. CBG: No results for input(s): "GLUCAP" in the last 168 hours.  Lipid Profile: No results for input(s): "CHOL", "HDL", "LDLCALC", "TRIG", "CHOLHDL", "LDLDIRECT" in the last 72 hours. Thyroid Function Tests: No results for input(s): "TSH", "T4TOTAL", "FREET4", "T3FREE", "THYROIDAB" in the last 72 hours. Anemia Panel: No results for input(s): "VITAMINB12", "FOLATE", "FERRITIN", "TIBC", "IRON", "RETICCTPCT" in the last 72  hours. Sepsis Labs: No results for input(s): "PROCALCITON", "LATICACIDVEN" in the last 168 hours.  No results found for this or any previous visit (from the past 240 hour(s)).       Radiology Studies: No results found.      Scheduled Meds:  apixaban  5 mg Oral BID   feeding supplement  237 mL Oral TID BM   ferrous sulfate  325 mg Oral QODAY   flecainide  50 mg Oral BID   multivitamin with minerals  1 tablet Oral Daily   pantoprazole  40 mg Oral Daily   Continuous Infusions:        Aline August, MD [facility] 09/12/2022, 8:01 AM

## 2022-09-13 DIAGNOSIS — E870 Hyperosmolality and hypernatremia: Secondary | ICD-10-CM | POA: Diagnosis not present

## 2022-09-13 NOTE — Progress Notes (Signed)
PROGRESS NOTE    Donna Simmons  WUJ:[id number] DOB: Jan 20, 1941 DOA: 08/25/2022 PCP: [name], DO   Brief Narrative:  82 year old with past medical history significant for Alzheimer's dementia, hypertension, hyperlipidemia, permanent A-fib on Eliquis admitted with failure to thrive, hypernatremia with a sodium of 166, poor oral intake.  Workup revealed UTI ESBL.  Patient treated with IV Ertapenem.  Currently awaiting placement.    Assessment & Plan:   Hypernatremia  - Presented  with a sodium level of 167, likely in the setting of poor oral intake and hypovolemia. Resolved with IV fluids, sodium now normalized.  Hold further fluids, allow p.o. intake and see how she does.  Hyponatremia confers a very poor prognosis in the setting of dementia, palliative care consulted, however family not ready for hospice -Sodium 140 on 09/10/2022.  Monitor intermittently.  UTI, E. coli ESBL  -status post 5 days of ertapenem.  Remains afebrile   Thrombocytosis -In the setting of infection.  Resolved   Hypokalemia -Replaced.  Improved   AKI -Present with a creatinine of 1.1.  Baseline 0.7, in the setting of hypovolemia, resolved IV fluids.  Creatinine normalized and has remained stable.  Monitor intermittently.   Transaminitis-Resolved.   Hypoalbuminemia - In the setting  of poor oral intake.  Follow nutrition recommendations   Iron deficiency anemia - Hemoglobin currently stable.  Monitor intermittently.  Permanent A-fib -Continue with Eliquis, continue flecainide, continue to hold metoprolol in the setting of soft blood pressures   Insomnia -Continue with melatonin   Alzheimer's dementia - Delirium precaution.  -Palliative care evaluation appreciated: Family not ready for patient to enroll in hospice yet   Disposition-Per social worker, she cannot return to her previous SNF due to financial issues, they will also need to pay 30 days upfront which spouse is unable to do so.  There is no  family to assist her at home.  Difficult to place, Medicaid application pending -Currently medically stable for discharge  DVT prophylaxis: Eliquis Code Status: DNR Family Communication: None at bedside Disposition Plan: Status is: Inpatient Remains inpatient appropriate because: of need for SNF placement  Consultants: Palliative care  Procedures: None  Antimicrobials:  Anti-infectives (From admission, onward)    Start     Dose/Rate Route Frequency Ordered Stop   09/03/22 1000  ertapenem (INVANZ) 1,000 mg in sodium chloride 0.9 % 100 mL IVPB        1 g 200 mL/hr over 30 Minutes Intravenous Every 24 hours 09/02/22 1421 09/04/22 2048   08/30/22 1500  ertapenem (INVANZ) 1,000 mg in sodium chloride 0.9 % 100 mL IVPB  Status:  Discontinued        1 g 200 mL/hr over 30 Minutes Intravenous Every 24 hours 08/30/22 1419 09/02/22 1421   08/29/22 1115  cefTRIAXone (ROCEPHIN) 1 g in sodium chloride 0.9 % 100 mL IVPB  Status:  Discontinued        1 g 200 mL/hr over 30 Minutes Intravenous Every 24 hours 08/29/22 1017 08/30/22 1419        Subjective: Patient seen and examined at bedside.  Poor historian, confused.  No fever, agitation, seizures reported.   Objective: Vitals:   09/12/22 0806 09/12/22 1648 09/12/22 2028 09/13/22 0610  BP: 131/83 102/70 98/65 115/66  Pulse: 96 98 (!) 105 97  Resp: 18 17 16 17   Temp: 97.8 F (36.6 C) 98.5 F (36.9 C) 98.3 F (36.8 C) 98.4 F (36.9 C)  TempSrc: Axillary Oral Oral Oral  SpO2: 100%  99% 100% 100%  Weight:      Height:        Intake/Output Summary (Last 24 hours) at 09/13/2022 0800 Last data filed at 09/12/2022 1800 Gross per 24 hour  Intake 360 ml  Output --  Net 360 ml    Filed Weights   08/30/22 1300  Weight: 50.2 kg    Examination:  General exam: No distress.  Still on room air.  Chronically ill and deconditioned looking.  Still confused and very slow to respond.  Poor historian. Respiratory system: Decreased breath  sounds at bases bilaterally with some crackles  cardiovascular system: Rate controlled; S1 and S2 are heard  gastrointestinal system: Abdomen is distended slightly; soft and nontender.  Bowel sounds heard normally  extremities: No clubbing or edema  Data Reviewed: I have personally reviewed following labs and imaging studies  CBC: Recent Labs  Lab 09/08/22 0243 09/10/22 0217  WBC 8.6 7.2  HGB 8.9* 8.7*  HCT 27.5* 27.2*  MCV 77.5* 77.3*  PLT 336 193    Basic Metabolic Panel: Recent Labs  Lab 09/08/22 0243 09/09/22 0302 09/10/22 0217  NA 136 140 140  K 3.5 3.6 3.6  CL 101 101 104  CO2 25 27 28   GLUCOSE 98 99 102*  BUN 5* 6* 11  CREATININE 0.51 0.57 0.57  CALCIUM 8.6* 8.6* 8.4*  MG  --  2.2 2.1    GFR: Estimated Creatinine Clearance: 45.1 mL/min (by C-G formula based on SCr of 0.57 mg/dL). Liver Function Tests: Recent Labs  Lab 09/10/22 0217  AST 26  ALT 22  ALKPHOS 74  BILITOT 0.6  PROT 6.3*  ALBUMIN 2.2*    No results for input(s): "LIPASE", "AMYLASE" in the last 168 hours. No results for input(s): "AMMONIA" in the last 168 hours. Coagulation Profile: No results for input(s): "INR", "PROTIME" in the last 168 hours. Cardiac Enzymes: No results for input(s): "CKTOTAL", "CKMB", "CKMBINDEX", "TROPONINI" in the last 168 hours. BNP (last 3 results) No results for input(s): "PROBNP" in the last 8760 hours. HbA1C: No results for input(s): "HGBA1C" in the last 72 hours. CBG: No results for input(s): "GLUCAP" in the last 168 hours.  Lipid Profile: No results for input(s): "CHOL", "HDL", "LDLCALC", "TRIG", "CHOLHDL", "LDLDIRECT" in the last 72 hours. Thyroid Function Tests: No results for input(s): "TSH", "T4TOTAL", "FREET4", "T3FREE", "THYROIDAB" in the last 72 hours. Anemia Panel: No results for input(s): "VITAMINB12", "FOLATE", "FERRITIN", "TIBC", "IRON", "RETICCTPCT" in the last 72 hours. Sepsis Labs: No results for input(s): "PROCALCITON", "LATICACIDVEN"  in the last 168 hours.  No results found for this or any previous visit (from the past 240 hour(s)).       Radiology Studies: No results found.      Scheduled Meds:  apixaban  5 mg Oral BID   feeding supplement  237 mL Oral TID BM   ferrous sulfate  325 mg Oral QODAY   flecainide  50 mg Oral BID   multivitamin with minerals  1 tablet Oral Daily   pantoprazole  40 mg Oral Daily   Continuous Infusions:        Aline August, MD [facility] 09/13/2022, 8:00 AM

## 2022-09-14 DIAGNOSIS — E870 Hyperosmolality and hypernatremia: Secondary | ICD-10-CM | POA: Diagnosis not present

## 2022-09-14 NOTE — Progress Notes (Signed)
PROGRESS NOTE    Donna Simmons  TIW:[id number] DOB: [birth year] DOA: 08/25/2022 PCP: [name], DO   Brief Narrative:  82 year old with past medical history significant for Alzheimer's dementia, hypertension, hyperlipidemia, permanent A-fib on Eliquis admitted with failure to thrive, hypernatremia with a sodium of 166, poor oral intake.  Workup revealed UTI ESBL.  Patient treated with IV Ertapenem.  Currently awaiting placement.    Assessment & Plan:   Hypernatremia  - Presented  with a sodium level of 167, likely in the setting of poor oral intake and hypovolemia. Resolved with IV fluids, sodium now normalized.  Hold further fluids, allow p.o. intake and see how she does.  Hyponatremia confers a very poor prognosis in the setting of dementia, palliative care consulted, however family not ready for hospice -Sodium 140 on 09/10/2022.  Monitor intermittently.  UTI, E. coli ESBL  -status post 5 days of ertapenem.  Remains afebrile   Thrombocytosis -In the setting of infection.  Resolved   Hypokalemia -Replaced.  Improved   AKI -Present with a creatinine of 1.1.  Baseline 0.7, in the setting of hypovolemia, resolved IV fluids.  Creatinine normalized and has remained stable.  Monitor intermittently.   Transaminitis-Resolved.   Hypoalbuminemia - In the setting  of poor oral intake.  Follow nutrition recommendations   Iron deficiency anemia - Hemoglobin currently stable.  Monitor intermittently.  Permanent A-fib -Continue with Eliquis, continue flecainide, continue to hold metoprolol in the setting of soft blood pressures   Insomnia -Continue with melatonin   Alzheimer's dementia - Delirium precaution.  -Palliative care evaluation appreciated: Family not ready for patient to enroll in hospice yet   Disposition-Per social worker, she cannot return to her previous SNF due to financial issues, they will also need to pay 30 days upfront which spouse is unable to do so.  There is no  family to assist her at home.  Difficult to place, Medicaid application pending -Currently medically stable for discharge  DVT prophylaxis: Eliquis Code Status: DNR Family Communication: None at bedside Disposition Plan: Status is: Inpatient Remains inpatient appropriate because: of need for SNF placement  Consultants: Palliative care  Procedures: None  Antimicrobials:  Anti-infectives (From admission, onward)    Start     Dose/Rate Route Frequency Ordered Stop   09/03/22 1000  ertapenem (INVANZ) 1,000 mg in sodium chloride 0.9 % 100 mL IVPB        1 g 200 mL/hr over 30 Minutes Intravenous Every 24 hours 09/02/22 1421 09/04/22 2048   08/30/22 1500  ertapenem (INVANZ) 1,000 mg in sodium chloride 0.9 % 100 mL IVPB  Status:  Discontinued        1 g 200 mL/hr over 30 Minutes Intravenous Every 24 hours 08/30/22 1419 09/02/22 1421   08/29/22 1115  cefTRIAXone (ROCEPHIN) 1 g in sodium chloride 0.9 % 100 mL IVPB  Status:  Discontinued        1 g 200 mL/hr over 30 Minutes Intravenous Every 24 hours 08/29/22 1017 08/30/22 1419        Subjective: Patient seen and examined at bedside.  Poor historian, confused.  No seizures, agitation, fever or vomiting reported.  Had fever overnight  objective: Vitals:   09/13/22 2058 09/13/22 2341 09/14/22 0659 09/14/22 0745  BP: 102/70 95/65 105/63 (!) 141/76  Pulse:  (!) 103 88 84  Resp: 17 18 18 17   Temp: (!) 100.5 F (38.1 C) 100 F (37.8 C) 97.6 F (36.4 C) 97.8 F (36.6 C)  TempSrc:  Oral Oral Oral Oral  SpO2: 100% 100% 100% 100%  Weight:      Height:        Intake/Output Summary (Last 24 hours) at 09/14/2022 0813 Last data filed at 09/14/2022 0701 Gross per 24 hour  Intake 120 ml  Output 200 ml  Net -80 ml    Filed Weights   08/30/22 1300  Weight: 50.2 kg    Examination:  General exam: On room air no acute distress.  Chronically ill and deconditioned looking.  Still confused and very slow to respond.  Poor  historian. Respiratory system: Bilateral decreased breath sounds at bases, no wheezing cardiovascular system: S1-S2 heard; currently rate controlled gastrointestinal system: Abdomen is mildly distended; soft and nontender.  Normal bowel sounds heard  extremities: No cyanosis or clubbing  Data Reviewed: I have personally reviewed following labs and imaging studies  CBC: Recent Labs  Lab 09/08/22 0243 09/10/22 0217  WBC 8.6 7.2  HGB 8.9* 8.7*  HCT 27.5* 27.2*  MCV 77.5* 77.3*  PLT 336 130    Basic Metabolic Panel: Recent Labs  Lab 09/08/22 0243 09/09/22 0302 09/10/22 0217  NA 136 140 140  K 3.5 3.6 3.6  CL 101 101 104  CO2 25 27 28   GLUCOSE 98 99 102*  BUN 5* 6* 11  CREATININE 0.51 0.57 0.57  CALCIUM 8.6* 8.6* 8.4*  MG  --  2.2 2.1    GFR: Estimated Creatinine Clearance: 45.1 mL/min (by C-G formula based on SCr of 0.57 mg/dL). Liver Function Tests: Recent Labs  Lab 09/10/22 0217  AST 26  ALT 22  ALKPHOS 74  BILITOT 0.6  PROT 6.3*  ALBUMIN 2.2*    No results for input(s): "LIPASE", "AMYLASE" in the last 168 hours. No results for input(s): "AMMONIA" in the last 168 hours. Coagulation Profile: No results for input(s): "INR", "PROTIME" in the last 168 hours. Cardiac Enzymes: No results for input(s): "CKTOTAL", "CKMB", "CKMBINDEX", "TROPONINI" in the last 168 hours. BNP (last 3 results) No results for input(s): "PROBNP" in the last 8760 hours. HbA1C: No results for input(s): "HGBA1C" in the last 72 hours. CBG: No results for input(s): "GLUCAP" in the last 168 hours.  Lipid Profile: No results for input(s): "CHOL", "HDL", "LDLCALC", "TRIG", "CHOLHDL", "LDLDIRECT" in the last 72 hours. Thyroid Function Tests: No results for input(s): "TSH", "T4TOTAL", "FREET4", "T3FREE", "THYROIDAB" in the last 72 hours. Anemia Panel: No results for input(s): "VITAMINB12", "FOLATE", "FERRITIN", "TIBC", "IRON", "RETICCTPCT" in the last 72 hours. Sepsis Labs: No results for  input(s): "PROCALCITON", "LATICACIDVEN" in the last 168 hours.  No results found for this or any previous visit (from the past 240 hour(s)).       Radiology Studies: No results found.      Scheduled Meds:  apixaban  5 mg Oral BID   feeding supplement  237 mL Oral TID BM   ferrous sulfate  325 mg Oral QODAY   flecainide  50 mg Oral BID   multivitamin with minerals  1 tablet Oral Daily   pantoprazole  40 mg Oral Daily   Continuous Infusions:        Aline August, MD [facility] 09/14/2022, 8:13 AM

## 2022-09-15 DIAGNOSIS — E870 Hyperosmolality and hypernatremia: Secondary | ICD-10-CM | POA: Diagnosis not present

## 2022-09-15 LAB — CBC WITH DIFFERENTIAL/PLATELET
Abs Immature Granulocytes: 0.04 10*3/uL (ref 0.00–0.07)
Basophils Absolute: 0 10*3/uL (ref 0.0–0.1)
Basophils Relative: 0 %
Eosinophils Absolute: 0 10*3/uL (ref 0.0–0.5)
Eosinophils Relative: 0 %
HCT: 28.3 % — ABNORMAL LOW (ref 36.0–46.0)
Hemoglobin: 8.9 g/dL — ABNORMAL LOW (ref 12.0–15.0)
Immature Granulocytes: 1 %
Lymphocytes Relative: 11 %
Lymphs Abs: 1 10*3/uL (ref 0.7–4.0)
MCH: 24.4 pg — ABNORMAL LOW (ref 26.0–34.0)
MCHC: 31.4 g/dL (ref 30.0–36.0)
MCV: 77.5 fL — ABNORMAL LOW (ref 80.0–100.0)
Monocytes Absolute: 0.5 10*3/uL (ref 0.1–1.0)
Monocytes Relative: 6 %
Neutro Abs: 7.1 10*3/uL (ref 1.7–7.7)
Neutrophils Relative %: 82 %
Platelets: 503 10*3/uL — ABNORMAL HIGH (ref 150–400)
RBC: 3.65 MIL/uL — ABNORMAL LOW (ref 3.87–5.11)
RDW: 16.4 % — ABNORMAL HIGH (ref 11.5–15.5)
WBC: 8.6 10*3/uL (ref 4.0–10.5)
nRBC: 0 % (ref 0.0–0.2)

## 2022-09-15 LAB — COMPREHENSIVE METABOLIC PANEL
ALT: 35 U/L (ref 0–44)
AST: 44 U/L — ABNORMAL HIGH (ref 15–41)
Albumin: 2.4 g/dL — ABNORMAL LOW (ref 3.5–5.0)
Alkaline Phosphatase: 70 U/L (ref 38–126)
Anion gap: 12 (ref 5–15)
BUN: 15 mg/dL (ref 8–23)
CO2: 27 mmol/L (ref 22–32)
Calcium: 8.8 mg/dL — ABNORMAL LOW (ref 8.9–10.3)
Chloride: 99 mmol/L (ref 98–111)
Creatinine, Ser: 0.56 mg/dL (ref 0.44–1.00)
GFR, Estimated: >60 mL/min (ref >60)
Glucose, Bld: 108 mg/dL — ABNORMAL HIGH (ref 70–99)
Potassium: 3.9 mmol/L (ref 3.5–5.1)
Sodium: 138 mmol/L (ref 135–145)
Total Bilirubin: 0.5 mg/dL (ref 0.3–1.2)
Total Protein: 6.8 g/dL (ref 6.5–8.1)

## 2022-09-15 LAB — MAGNESIUM: Magnesium: 1.9 mg/dL (ref 1.7–2.4)

## 2022-09-15 NOTE — Progress Notes (Signed)
PROGRESS NOTE    Donna Simmons  WLN:[id number] DOB: 07-17-1941 DOA: 08/25/2022 PCP: [name], DO   Brief Narrative:  82 year old with past medical history significant for Alzheimer's dementia, hypertension, hyperlipidemia, permanent A-fib on Eliquis admitted with failure to thrive, hypernatremia with a sodium of 166, poor oral intake.  Workup revealed UTI ESBL.  Patient treated with IV Ertapenem.  Currently awaiting placement.    Assessment & Plan:   Hypernatremia  - Presented  with a sodium level of 167, likely in the setting of poor oral intake and hypovolemia. Resolved with IV fluids, sodium now normalized.  Hold further fluids, allow p.o. intake and see how she does.  Hyponatremia confers a very poor prognosis in the setting of dementia, palliative care consulted, however family not ready for hospice -Sodium 140 on 09/10/2022.  Monitor intermittently.  UTI, E. coli ESBL  -status post 5 days of ertapenem.  Remains afebrile   Thrombocytosis -In the setting of infection.  Resolved   Hypokalemia -Replaced.  Improved   AKI -Present with a creatinine of 1.1.  Baseline 0.7, in the setting of hypovolemia, resolved IV fluids.  Creatinine normalized and has remained stable.  Monitor intermittently.   Transaminitis-Resolved.   Hypoalbuminemia - In the setting  of poor oral intake.  Follow nutrition recommendations   Iron deficiency anemia - Hemoglobin currently stable.  Monitor intermittently.  Permanent A-fib -Continue with Eliquis, continue flecainide, continue to hold metoprolol in the setting of soft blood pressures   Insomnia -Continue with melatonin   Alzheimer's dementia - Delirium precaution.  -Palliative care evaluation appreciated: Family not ready for patient to enroll in hospice yet   Disposition-Per social worker, she cannot return to her previous SNF due to financial issues, they will also need to pay 30 days upfront which spouse is unable to do so.  There is no  family to assist her at home.  Difficult to place, Medicaid application pending -Currently medically stable for discharge  DVT prophylaxis: Eliquis Code Status: DNR Family Communication: None at bedside Disposition Plan: Status is: Inpatient Remains inpatient appropriate because: of need for SNF placement  Consultants: Palliative care  Procedures: None  Antimicrobials:  Anti-infectives (From admission, onward)    Start     Dose/Rate Route Frequency Ordered Stop   09/03/22 1000  ertapenem (INVANZ) 1,000 mg in sodium chloride 0.9 % 100 mL IVPB        1 g 200 mL/hr over 30 Minutes Intravenous Every 24 hours 09/02/22 1421 09/04/22 2048   08/30/22 1500  ertapenem (INVANZ) 1,000 mg in sodium chloride 0.9 % 100 mL IVPB  Status:  Discontinued        1 g 200 mL/hr over 30 Minutes Intravenous Every 24 hours 08/30/22 1419 09/02/22 1421   08/29/22 1115  cefTRIAXone (ROCEPHIN) 1 g in sodium chloride 0.9 % 100 mL IVPB  Status:  Discontinued        1 g 200 mL/hr over 30 Minutes Intravenous Every 24 hours 08/29/22 1017 08/30/22 1419        Subjective: Patient seen and examined at bedside.  Poor historian, confused.  No seizures fever, vomiting reported  objective: Vitals:   09/14/22 0745 09/14/22 1506 09/14/22 2059 09/15/22 0553  BP: (!) 141/76 127/82 111/62 (!) 100/57  Pulse: 84 (!) 103 (!) 109 79  Resp: 17 14 16 16   Temp: 97.8 F (36.6 C) 98.2 F (36.8 C) 98.8 F (37.1 C) 98.3 F (36.8 C)  TempSrc: Oral Axillary Oral Oral  SpO2: 100% 100% 99% 95%  Weight:      Height:        Intake/Output Summary (Last 24 hours) at 09/15/2022 0755 Last data filed at 09/15/2022 0615 Gross per 24 hour  Intake 120 ml  Output 250 ml  Net -130 ml    Filed Weights   08/30/22 1300  Weight: 50.2 kg    Examination:  General exam: No distress.  Still on room air.  Chronically ill and deconditioned looking.  Still confused and very slow to respond.  Poor historian. Respiratory system:  Decreased breath sounds at bases bilaterally with scattered crackles  cardiovascular system: Rate controlled; S1 and S2 are heard  gastrointestinal system: Abdomen is distended slightly; soft and nontender.  Bowel sound are heard  extremities: No edema or clubbing  Data Reviewed: I have personally reviewed following labs and imaging studies  CBC: Recent Labs  Lab 09/10/22 0217 09/15/22 0308  WBC 7.2 8.6  NEUTROABS  --  7.1  HGB 8.7* 8.9*  HCT 27.2* 28.3*  MCV 77.3* 77.5*  PLT 358 503*    Basic Metabolic Panel: Recent Labs  Lab 09/09/22 0302 09/10/22 0217 09/15/22 0308  NA 140 140 138  K 3.6 3.6 3.9  CL 101 104 99  CO2 27 28 27   GLUCOSE 99 102* 108*  BUN 6* 11 15  CREATININE 0.57 0.57 0.56  CALCIUM 8.6* 8.4* 8.8*  MG 2.2 2.1 1.9    GFR: Estimated Creatinine Clearance: 45.1 mL/min (by C-G formula based on SCr of 0.56 mg/dL). Liver Function Tests: Recent Labs  Lab 09/10/22 0217 09/15/22 0308  AST 26 44*  ALT 22 35  ALKPHOS 74 70  BILITOT 0.6 0.5  PROT 6.3* 6.8  ALBUMIN 2.2* 2.4*    No results for input(s): "LIPASE", "AMYLASE" in the last 168 hours. No results for input(s): "AMMONIA" in the last 168 hours. Coagulation Profile: No results for input(s): "INR", "PROTIME" in the last 168 hours. Cardiac Enzymes: No results for input(s): "CKTOTAL", "CKMB", "CKMBINDEX", "TROPONINI" in the last 168 hours. BNP (last 3 results) No results for input(s): "PROBNP" in the last 8760 hours. HbA1C: No results for input(s): "HGBA1C" in the last 72 hours. CBG: No results for input(s): "GLUCAP" in the last 168 hours.  Lipid Profile: No results for input(s): "CHOL", "HDL", "LDLCALC", "TRIG", "CHOLHDL", "LDLDIRECT" in the last 72 hours. Thyroid Function Tests: No results for input(s): "TSH", "T4TOTAL", "FREET4", "T3FREE", "THYROIDAB" in the last 72 hours. Anemia Panel: No results for input(s): "VITAMINB12", "FOLATE", "FERRITIN", "TIBC", "IRON", "RETICCTPCT" in the last 72  hours. Sepsis Labs: No results for input(s): "PROCALCITON", "LATICACIDVEN" in the last 168 hours.  No results found for this or any previous visit (from the past 240 hour(s)).       Radiology Studies: No results found.      Scheduled Meds:  apixaban  5 mg Oral BID   feeding supplement  237 mL Oral TID BM   ferrous sulfate  325 mg Oral QODAY   flecainide  50 mg Oral BID   multivitamin with minerals  1 tablet Oral Daily   pantoprazole  40 mg Oral Daily   Continuous Infusions:        Aline August, MD [facility] 09/15/2022, 7:55 AM

## 2022-09-15 NOTE — TOC Progression Note (Signed)
Transition of Care [facility]) - Progression Note    Patient Details  Name: Donna Simmons MRN: [id number] Date of Birth: Aug 05, 1941  Transition of Care [facility]) CM/SW [place], [place] Phone Number: 09/15/2022, 11:36 AM  Clinical Narrative:     CSW followed up with pt's spouse to get an update on her Medicaid application. Spouse was recently discharged from the hospital himself. He states he is currently working on the application now, and will follow up with CSW with any updates. CSW noted that TOC will continue to follow and follow up with him. The importance of this application being processed was discussed again. TOC will continue to follow for DC needs.        Expected Discharge Plan and Services                                               Social Determinants of Health (SDOH) Interventions SDOH Screenings   Food Insecurity: Unknown (07/03/2022)  Alcohol Screen: Low Risk  (04/12/2019)  Depression (PHQ2-9): Low Risk  (11/05/2020)  Tobacco Use: Low Risk  (08/20/2022)    Readmission Risk Interventions     No data to display

## 2022-09-16 DIAGNOSIS — E870 Hyperosmolality and hypernatremia: Secondary | ICD-10-CM | POA: Diagnosis not present

## 2022-09-16 DIAGNOSIS — R6251 Failure to thrive (child): Secondary | ICD-10-CM | POA: Diagnosis not present

## 2022-09-16 NOTE — Plan of Care (Signed)
  Problem: Clinical Measurements: Goal: Diagnostic test results will improve Outcome: Progressing   

## 2022-09-16 NOTE — Progress Notes (Signed)
   09/16/22 1354  Assess: MEWS Score  Temp 98.7 F (37.1 C)  BP (!) 76/49  MAP (mmHg) (!) 60  Pulse Rate (!) 108  Resp 16  SpO2 98 %  O2 Device Room Air  Assess: MEWS Score  MEWS Temp 0  MEWS Systolic 2  MEWS Pulse 1  MEWS RR 0  MEWS LOC 0  MEWS Score 3  MEWS Score Color Yellow  Assess: if the MEWS score is Yellow or Red  Were vital signs taken at a resting state? Yes  Focused Assessment No change from prior assessment  Does the patient meet 2 or more of the SIRS criteria? No  MEWS guidelines implemented *See Row Information* Yes  Treat  Pain Scale PAINAD  Pain Score 0  Take Vital Signs  Increase Vital Sign Frequency  Yellow: Q 2hr X 2 then Q 4hr X 2, if remains yellow, continue Q 4hrs  Escalate  MEWS: Escalate Yellow: discuss with charge nurse/RN and consider discussing with provider and RRT  Notify: Charge Nurse/RN  Name of Charge Nurse/RN Notified Sharee Pimple RN  Date Charge Nurse/RN Notified 09/16/22  Time Charge Nurse/RN Notified 1400  Provider Notification  Provider Name/Title Kshitiz Starla Link  Date Provider Notified 09/16/22  Time Provider Notified 1353  Method of Notification Page (secure chat)  Notification Reason Critical Result  Provider response No new orders (stated that we will continue to monitor)  Date of Provider Response 09/16/22  Time of Provider Response 1356  Assess: SIRS CRITERIA  SIRS Temperature  0  SIRS Pulse 1  SIRS Respirations  0  SIRS WBC 0  SIRS Score Sum  1

## 2022-09-16 NOTE — Progress Notes (Signed)
   09/16/22 1000  Assess: MEWS Score  Pulse Rate 99  Level of Consciousness Alert  SpO2 100 %  O2 Device Room Air  Assess: MEWS Score  MEWS Temp 0  MEWS Systolic 1  MEWS Pulse 0  MEWS RR 0  MEWS LOC 0  MEWS Score 1  MEWS Score Color Green  Assess: if the MEWS score is Yellow or Red  Were vital signs taken at a resting state? Yes  Focused Assessment No change from prior assessment  Does the patient meet 2 or more of the SIRS criteria? No  MEWS guidelines implemented *See Row Information* No, vital signs rechecked  Assess: SIRS CRITERIA  SIRS Temperature  0  SIRS Pulse 1  SIRS Respirations  0  SIRS WBC 0  SIRS Score Sum  1

## 2022-09-16 NOTE — Progress Notes (Deleted)
Called son, Alverda Skeans, to give him an update.

## 2022-09-16 NOTE — Progress Notes (Signed)
   09/16/22 1544  Assess: MEWS Score  Temp 97.6 F (36.4 C)  BP (!) 105/56  MAP (mmHg) 72  Pulse Rate 82  Resp 16  SpO2 96 %  O2 Device Room Air  Assess: MEWS Score  MEWS Temp 0  MEWS Systolic 0  MEWS Pulse 0  MEWS RR 0  MEWS LOC 0  MEWS Score 0  MEWS Score Color Green  Assess: SIRS CRITERIA  SIRS Temperature  0  SIRS Pulse 0  SIRS Respirations  0  SIRS WBC 0  SIRS Score Sum  0

## 2022-09-16 NOTE — Plan of Care (Signed)
Pt contracted limited ability to turn due to contractures. Foams changed and bath given this am  Problem: Skin Integrity: Goal: Risk for impaired skin integrity will decrease Outcome: Not Progressing   Problem: Education: Goal: Knowledge of General Education information will improve Description: Including pain rating scale, medication(s)/side effects and non-pharmacologic comfort measures Outcome: Progressing   Problem: Health Behavior/Discharge Planning: Goal: Ability to manage health-related needs will improve Outcome: Progressing   Problem: Clinical Measurements: Goal: Ability to maintain clinical measurements within normal limits will improve Outcome: Progressing Goal: Will remain free from infection Outcome: Progressing Goal: Diagnostic test results will improve Outcome: Progressing Goal: Respiratory complications will improve Outcome: Progressing Goal: Cardiovascular complication will be avoided Outcome: Progressing   Problem: Activity: Goal: Risk for activity intolerance will decrease Outcome: Progressing   Problem: Nutrition: Goal: Adequate nutrition will be maintained Outcome: Progressing   Problem: Coping: Goal: Level of anxiety will decrease Outcome: Progressing   Problem: Elimination: Goal: Will not experience complications related to bowel motility Outcome: Progressing Goal: Will not experience complications related to urinary retention Outcome: Progressing   Problem: Pain Managment: Goal: General experience of comfort will improve Outcome: Progressing   Problem: Safety: Goal: Ability to remain free from injury will improve Outcome: Progressing

## 2022-09-16 NOTE — Progress Notes (Signed)
Notified Dr. Remi Haggard that patient's BP was 76/49. He replied to continue to monitor. Patient is a yellow MEWS so will follow MEWS guidelines.

## 2022-09-16 NOTE — Progress Notes (Signed)
PROGRESS NOTE    Donna Simmons  ZDG:[id number] DOB: 25-Nov-1940 DOA: 08/25/2022 PCP: [name], DO   Brief Narrative:  82 year old with past medical history significant for Alzheimer's dementia, hypertension, hyperlipidemia, permanent A-fib on Eliquis admitted with failure to thrive, hypernatremia with a sodium of 166, poor oral intake.  Workup revealed UTI ESBL.  Patient treated with IV Ertapenem.  Currently awaiting placement.    Assessment & Plan:   Hypernatremia  - Presented  with a sodium level of 167, likely in the setting of poor oral intake and hypovolemia. Resolved with IV fluids, sodium now normalized.  Hold further fluids, allow p.o. intake and see how she does.  Hyponatremia confers a very poor prognosis in the setting of dementia, palliative care consulted, however family not ready for hospice -Sodium 138 on 09/15/2022.  Monitor intermittently.  UTI, E. coli ESBL  -status post 5 days of ertapenem.  Remains afebrile   Thrombocytosis -In the setting of infection.  Resolved   Hypokalemia -Replaced.  Improved   AKI -Present with a creatinine of 1.1.  Baseline 0.7, in the setting of hypovolemia, resolved IV fluids.  Creatinine normalized and has remained stable.  Monitor intermittently.   Transaminitis-almost normalized.   Hypoalbuminemia - In the setting  of poor oral intake.  Follow nutrition recommendations   Iron deficiency anemia - Hemoglobin currently stable.  Monitor intermittently.  Permanent A-fib -Continue with Eliquis, continue flecainide, continue to hold metoprolol in the setting of soft blood pressures  Thrombocytosis  -Possibly reactive.  Monitor intermittently  Insomnia -Continue with melatonin   Alzheimer's dementia - Delirium precaution.  -Palliative care evaluation appreciated: Family not ready for patient to enroll in hospice yet   Disposition-Per social worker, she cannot return to her previous SNF due to financial issues, they will also  need to pay 30 days upfront which spouse is unable to do so.  There is no family to assist her at home.  Difficult to place, Medicaid application pending -Currently medically stable for discharge  DVT prophylaxis: Eliquis Code Status: DNR Family Communication: None at bedside Disposition Plan: Status is: Inpatient Remains inpatient appropriate because: of need for SNF placement  Consultants: Palliative care  Procedures: None  Antimicrobials:  Anti-infectives (From admission, onward)    Start     Dose/Rate Route Frequency Ordered Stop   09/03/22 1000  ertapenem (INVANZ) 1,000 mg in sodium chloride 0.9 % 100 mL IVPB        1 g 200 mL/hr over 30 Minutes Intravenous Every 24 hours 09/02/22 1421 09/04/22 2048   08/30/22 1500  ertapenem (INVANZ) 1,000 mg in sodium chloride 0.9 % 100 mL IVPB  Status:  Discontinued        1 g 200 mL/hr over 30 Minutes Intravenous Every 24 hours 08/30/22 1419 09/02/22 1421   08/29/22 1115  cefTRIAXone (ROCEPHIN) 1 g in sodium chloride 0.9 % 100 mL IVPB  Status:  Discontinued        1 g 200 mL/hr over 30 Minutes Intravenous Every 24 hours 08/29/22 1017 08/30/22 1419        Subjective: Patient seen and examined at bedside.  Poor historian, still confused.  No fever, agitation, vomiting reported.   Objective: Vitals:   09/15/22 0832 09/15/22 1524 09/15/22 1950 09/16/22 0621  BP: 131/82 (!) 97/53 101/70 (!) 110/91  Pulse: (!) 125 98 93 60  Resp: 15  16 16   Temp: 97.8 F (36.6 C) 99 F (37.2 C) 98 F (36.7 C) 98.3 F (  36.8 C)  TempSrc: Oral Oral Oral Oral  SpO2: 100% 100% 100% 100%  Weight:      Height:        Intake/Output Summary (Last 24 hours) at 09/16/2022 0747 Last data filed at 09/15/2022 1541 Gross per 24 hour  Intake 360 ml  Output --  Net 360 ml    Filed Weights   08/30/22 1300  Weight: 50.2 kg    Examination:  General exam: On room air currently in no distress.  Chronically ill and deconditioned looking.  Still confused  and very slow to respond.  Poor historian. Respiratory system: Bilateral decreased breath sounds at bases with some crackles cardiovascular system: S1-S2 heard; rate controlled currently gastrointestinal system: Abdomen is mildly distended; soft and nontender.  Normal bowel sounds heard  extremities: No clubbing or cyanosis  Data Reviewed: I have personally reviewed following labs and imaging studies  CBC: Recent Labs  Lab 09/10/22 0217 09/15/22 0308  WBC 7.2 8.6  NEUTROABS  --  7.1  HGB 8.7* 8.9*  HCT 27.2* 28.3*  MCV 77.3* 77.5*  PLT 358 503*    Basic Metabolic Panel: Recent Labs  Lab 09/10/22 0217 09/15/22 0308  NA 140 138  K 3.6 3.9  CL 104 99  CO2 28 27  GLUCOSE 102* 108*  BUN 11 15  CREATININE 0.57 0.56  CALCIUM 8.4* 8.8*  MG 2.1 1.9    GFR: Estimated Creatinine Clearance: 45.1 mL/min (by C-G formula based on SCr of 0.56 mg/dL). Liver Function Tests: Recent Labs  Lab 09/10/22 0217 09/15/22 0308  AST 26 44*  ALT 22 35  ALKPHOS 74 70  BILITOT 0.6 0.5  PROT 6.3* 6.8  ALBUMIN 2.2* 2.4*    No results for input(s): "LIPASE", "AMYLASE" in the last 168 hours. No results for input(s): "AMMONIA" in the last 168 hours. Coagulation Profile: No results for input(s): "INR", "PROTIME" in the last 168 hours. Cardiac Enzymes: No results for input(s): "CKTOTAL", "CKMB", "CKMBINDEX", "TROPONINI" in the last 168 hours. BNP (last 3 results) No results for input(s): "PROBNP" in the last 8760 hours. HbA1C: No results for input(s): "HGBA1C" in the last 72 hours. CBG: No results for input(s): "GLUCAP" in the last 168 hours.  Lipid Profile: No results for input(s): "CHOL", "HDL", "LDLCALC", "TRIG", "CHOLHDL", "LDLDIRECT" in the last 72 hours. Thyroid Function Tests: No results for input(s): "TSH", "T4TOTAL", "FREET4", "T3FREE", "THYROIDAB" in the last 72 hours. Anemia Panel: No results for input(s): "VITAMINB12", "FOLATE", "FERRITIN", "TIBC", "IRON", "RETICCTPCT" in  the last 72 hours. Sepsis Labs: No results for input(s): "PROCALCITON", "LATICACIDVEN" in the last 168 hours.  No results found for this or any previous visit (from the past 240 hour(s)).       Radiology Studies: No results found.      Scheduled Meds:  apixaban  5 mg Oral BID   feeding supplement  237 mL Oral TID BM   ferrous sulfate  325 mg Oral QODAY   flecainide  50 mg Oral BID   multivitamin with minerals  1 tablet Oral Daily   pantoprazole  40 mg Oral Daily   Continuous Infusions:        Aline August, MD [facility] 09/16/2022, 7:47 AM

## 2022-09-16 NOTE — Progress Notes (Signed)
Nutrition Follow-up  DOCUMENTATION CODES:   Severe malnutrition in context of chronic illness  INTERVENTION:   Continue Ensure Enlive po TID, each supplement provides 350 kcal and 20 grams of protein. Staff to assist with feeding pt Continue Multivitamin w/ minerals daily  NUTRITION DIAGNOSIS:   Severe Malnutrition related to chronic illness as evidenced by severe muscle depletion, severe fat depletion. - New diagnosis  GOAL:   Patient will meet greater than or equal to 90% of their needs - Ongoing  MONITOR:   PO intake, Supplement acceptance, Weight trends, Labs  REASON FOR ASSESSMENT:   Consult Assessment of nutrition requirement/status  ASSESSMENT:   82 y.o. female presented to the ED with failure to thrive from SNF, recent decreased PO intake and lethargy. PMH includes Alzheimer's dementia, GERD, HTN, and A. Fib. Pt admitted with hypernatremia, AKI, dehydration, and hypokalemia.   12/25 - Admitted  Discussed with RN, recently attempted to feed pt lunch and she did not want to eat. RD observed to lunch tray, 0% consumed. RN reports that pt drank some of Ensure this morning, but did not want the one this afternoon. Pt laying in bed, does not talk to RD, will open eyes to touch or voice intermittently.   Meal Intake 01/02-01/08: 3-70% x 8 meals (average 22%) 01/11-01/15: 5-100% x 7 meals (average 57%)  Medications reviewed and include: Ferrous Sulfate, MVI, Protonix Labs reviewed.   NUTRITION - FOCUSED PHYSICAL EXAM:  Flowsheet Row Most Recent Value  Orbital Region Severe depletion  Upper Arm Region Severe depletion  Thoracic and Lumbar Region Severe depletion  Buccal Region Severe depletion  Temple Region Severe depletion  Clavicle Bone Region Severe depletion  Clavicle and Acromion Bone Region Severe depletion  Scapular Bone Region Severe depletion  Dorsal Hand Unable to assess  Patellar Region Unable to assess  Anterior Thigh Region Unable to assess   Posterior Calf Region Unable to assess  Edema (RD Assessment) Mild  Hair Reviewed  Eyes Unable to assess  Mouth Unable to assess  Skin Reviewed  Nails Unable to assess   Diet Order:   Diet Order             DIET - DYS 1 Room service appropriate? Yes; Fluid consistency: Thin  Diet effective now                   EDUCATION NEEDS:   Not appropriate for education at this time  Skin:  Skin Assessment: Skin Integrity Issues: Skin Integrity Issues:: Stage II Stage II: Sacrum  Last BM:  1/16 - Type 6, smear  Height:  Ht Readings from Last 1 Encounters:  08/30/22 5\' 2"  (1.575 m)   Weight:  Wt Readings from Last 1 Encounters:  08/30/22 50.2 kg   Ideal Body Weight:  50 kg  BMI:  Body mass index is 20.24 kg/m.  Estimated Nutritional Needs:  Kcal:  1500-1700 Protein:  75-90 grams Fluid:  >/= 1.5 L    Hermina Barters RD, LDN Clinical Dietitian See [facility] for contact information.

## 2022-09-16 NOTE — Plan of Care (Signed)

## 2022-09-17 DIAGNOSIS — E870 Hyperosmolality and hypernatremia: Secondary | ICD-10-CM | POA: Diagnosis not present

## 2022-09-17 LAB — COMPREHENSIVE METABOLIC PANEL
ALT: 21 U/L (ref 0–44)
AST: 11 U/L — ABNORMAL LOW (ref 15–41)
Albumin: 2 g/dL — ABNORMAL LOW (ref 3.5–5.0)
Alkaline Phosphatase: 67 U/L (ref 38–126)
Anion gap: 9 (ref 5–15)
BUN: 10 mg/dL (ref 8–23)
CO2: 29 mmol/L (ref 22–32)
Calcium: 8.6 mg/dL — ABNORMAL LOW (ref 8.9–10.3)
Chloride: 100 mmol/L (ref 98–111)
Creatinine, Ser: 0.59 mg/dL (ref 0.44–1.00)
GFR, Estimated: >60 mL/min (ref >60)
Glucose, Bld: 89 mg/dL (ref 70–99)
Potassium: 3.5 mmol/L (ref 3.5–5.1)
Sodium: 138 mmol/L (ref 135–145)
Total Bilirubin: 0.4 mg/dL (ref 0.3–1.2)
Total Protein: 6.1 g/dL — ABNORMAL LOW (ref 6.5–8.1)

## 2022-09-17 LAB — MAGNESIUM: Magnesium: 2 mg/dL (ref 1.7–2.4)

## 2022-09-17 MED ORDER — POTASSIUM CHLORIDE 20 MEQ PO PACK
20.0000 meq | PACK | Freq: Two times a day (BID) | ORAL | Status: AC
  Administered 2022-09-17 – 2022-09-18 (×3): 20 meq via ORAL
  Filled 2022-09-17 (×3): qty 1

## 2022-09-17 NOTE — TOC Progression Note (Signed)
Transition of Care [facility]) - Progression Note    Patient Details  Name: Donna Simmons MRN: [id number] Date of Birth: 21-Feb-1941  Transition of Care [facility]) CM/SW [place], [place] Phone Number: 09/17/2022, 3:07 PM  Clinical Narrative:     CSW followed up with pt's spouse at bedside. He stated he is having someone help him finish the forms, but they have not been submitted. CSW reiterated the importance of this being done expeditiously, spouse noted understanding. TOC will continue to follow for DC needs.         Expected Discharge Plan and Services                                               Social Determinants of Health (SDOH) Interventions SDOH Screenings   Food Insecurity: Unknown (07/03/2022)  Alcohol Screen: Low Risk  (04/12/2019)  Depression (PHQ2-9): Low Risk  (11/05/2020)  Tobacco Use: Low Risk  (08/20/2022)    Readmission Risk Interventions     No data to display

## 2022-09-17 NOTE — Progress Notes (Signed)
PROGRESS NOTE    Donna Simmons  RXV:[id number] DOB: [birth year] DOA: 08/25/2022 PCP: [name], DO    Brief Narrative:   Donna Simmons is a 82 y.o. female with past medical history significant for Alzheimer's dementia, HTN, HLD, permanent atrial fibrillation on EliquisWho presented to [facility] ED on 12/25 from [facility] for failure to thrive.  Patient with decreased oral intake over the past 3 days with increased lethargy.  EMS was activated and patient was brought to the ED for further evaluation.  Workup revealed ESBL UTI and patient completed antibiotic course with IV ertapenem.  Currently awaiting placement.  Assessment & Plan:   ESBL E. coli UTI Completed 5-day course of ertapenem.  Hypernatremia Presenting with a sodium level of 167, likely in the setting of poor oral intake/dehydration from hypovolemia.  Patient was started on IV fluid hydration with normalization of sodium level.  Continue to encourage increased oral intake.  Unfortunately, hyponatremia confers a very poor prognosis in the setting of dementia and palliative care was consulted; however family not ready for hospice at this time.  Sodium 138 on 1/17. -- Continue intermittent BMPs  Thrombocytosis Etiology in the setting of active infection as above.  Now resolved.  Hypokalemia Repleted, now resolved.  Acute renal failure: Resolved Presented with a creatinine 1.1, baseline 0.7.  Etiology likely secondary to severe dehydration, now resolved following IV fluid hydration.  Transaminitis Improved with IV fluid hydration.  Hypoalbuminemia In the setting of poor oral intake, underlying dementia.  Continue to encourage increased oral intake.  Iron deficiency anemia Hemoglobin stable, continue intermittent monitoring of H&H.  Permanent atrial fibrillation -- Flecainide 50 mg p.o. twice daily -- Eliquis 5 mg p.o. twice daily -- Holding home metoprolol in the setting of soft blood pressures  Insomnia --  Melatonin 5 mg p.o. nightly as needed insomnia  Alzheimer's dementia --Delirium precautions --Get up during the day --Encourage a familiar face to remain present throughout the day --Keep blinds open and lights on during daylight hours --Minimize the use of opioids/benzodiazepines -- Evaluated by palliative care, family not ready for patient to enroll in hospice services yet.  Disposition Per social work, patient cannot return to her previous SNF due to financial issues, they will also need to pay 30 days out from which spouse is unable to do so.  No family to assist at home.  Difficult placement, Medicaid application pending.  Currently medically stable for discharge once safe discharge plan acquired.  DVT prophylaxis: SCDs Start: 08/25/22 0659 apixaban (ELIQUIS) tablet 5 mg    Code Status: DNR Family Communication: No family present at bedside this morning  Disposition Plan:  Level of care: Med-Surg Status is: Inpatient Remains inpatient appropriate because: Medically stable for discharge once safe disposition found by social work    Consultants:  Palliative care  Procedures:  None  Antimicrobials:  Ertapenem 12/30 - 1/4  Ceftriaxone 12/29 - 12/30   Subjective: Patient seen examined bedside, resting comfortably.  Pleasantly confused.  No complaints this morning.  Remains medically stable for discharge once disposition found per TOC.  Denies headache, no chest pain, no shortness of breath, no abdominal pain.  No acute events overnight per nursing staff.  Objective: Vitals:   09/16/22 1811 09/16/22 2201 09/17/22 0451 09/17/22 0926  BP: (!) 99/58 (!) 98/58 104/67 (!) 109/59  Pulse: (!) 105 100 (!) 103 (!) 112  Resp:  16 18 17   Temp: 97.9 F (36.6 C) 97.9 F (36.6 C) 98 F (36.7  C) 98.5 F (36.9 C)  TempSrc: Oral Oral Oral Oral  SpO2: 100% 94% 100% 98%  Weight:      Height:        Intake/Output Summary (Last 24 hours) at 09/17/2022 1458 Last data filed at  09/17/2022 1130 Gross per 24 hour  Intake 600 ml  Output --  Net 600 ml   Filed Weights   08/30/22 1300  Weight: 50.2 kg    Examination:  Physical Exam: GEN: NAD, alert,, pleasantly confused, thin/cachectic in appearance, HEENT: NCAT, PERRL, EOMI, sclera clear, dry mucous membranes PULM: CTAB w/o wheezes/crackles, normal respiratory effort, on room air CV: RRR w/o M/G/R GI: abd soft, NTND, NABS, no R/G/M MSK: no peripheral edema Integumentary: dry/intact, no rashes or wounds    Data Reviewed: I have personally reviewed following labs and imaging studies  CBC: Recent Labs  Lab 09/15/22 0308  WBC 8.6  NEUTROABS 7.1  HGB 8.9*  HCT 28.3*  MCV 77.5*  PLT 370*   Basic Metabolic Panel: Recent Labs  Lab 09/15/22 0308 09/17/22 0325  NA 138 138  K 3.9 3.5  CL 99 100  CO2 27 29  GLUCOSE 108* 89  BUN 15 10  CREATININE 0.56 0.59  CALCIUM 8.8* 8.6*  MG 1.9 2.0   GFR: Estimated Creatinine Clearance: 45.1 mL/min (by C-G formula based on SCr of 0.59 mg/dL). Liver Function Tests: Recent Labs  Lab 09/15/22 0308 09/17/22 0325  AST 44* 11*  ALT 35 21  ALKPHOS 70 67  BILITOT 0.5 0.4  PROT 6.8 6.1*  ALBUMIN 2.4* 2.0*   No results for input(s): "LIPASE", "AMYLASE" in the last 168 hours. No results for input(s): "AMMONIA" in the last 168 hours. Coagulation Profile: No results for input(s): "INR", "PROTIME" in the last 168 hours. Cardiac Enzymes: No results for input(s): "CKTOTAL", "CKMB", "CKMBINDEX", "TROPONINI" in the last 168 hours. BNP (last 3 results) No results for input(s): "PROBNP" in the last 8760 hours. HbA1C: No results for input(s): "HGBA1C" in the last 72 hours. CBG: No results for input(s): "GLUCAP" in the last 168 hours. Lipid Profile: No results for input(s): "CHOL", "HDL", "LDLCALC", "TRIG", "CHOLHDL", "LDLDIRECT" in the last 72 hours. Thyroid Function Tests: No results for input(s): "TSH", "T4TOTAL", "FREET4", "T3FREE", "THYROIDAB" in the last  72 hours. Anemia Panel: No results for input(s): "VITAMINB12", "FOLATE", "FERRITIN", "TIBC", "IRON", "RETICCTPCT" in the last 72 hours. Sepsis Labs: No results for input(s): "PROCALCITON", "LATICACIDVEN" in the last 168 hours.  No results found for this or any previous visit (from the past 240 hour(s)).       Radiology Studies: No results found.      Scheduled Meds:  apixaban  5 mg Oral BID   feeding supplement  237 mL Oral TID BM   ferrous sulfate  325 mg Oral QODAY   flecainide  50 mg Oral BID   multivitamin with minerals  1 tablet Oral Daily   pantoprazole  40 mg Oral Daily   Continuous Infusions:   LOS: 22 days    Time spent: 51 minutes spent on chart review, discussion with nursing staff, consultants, updating family and interview/physical exam; more than 50% of that time was spent in counseling and/or coordination of care.    Lezette Kitts J [place] DO [facility] Available via Epic secure chat 7am-7pm After these hours, please refer to coverage provider listed on [email or web address] 09/17/2022, 2:58 PM

## 2022-09-18 DIAGNOSIS — E870 Hyperosmolality and hypernatremia: Secondary | ICD-10-CM | POA: Diagnosis not present

## 2022-09-18 NOTE — Progress Notes (Signed)
PROGRESS NOTE    Donna Simmons  [id number]:[id number] DOB: 01/31/1941 DOA: 08/25/2022 PCP: [name], DO    Brief Narrative:   Donna Simmons is a 82 y.o. female with past medical history significant for Alzheimer's dementia, HTN, HLD, permanent atrial fibrillation on EliquisWho presented to [facility] ED [facility]25 from [facility] for failure to thrive.  Patient with decreased oral intake over the past 3 days with increased lethargy.  EMS was activated and patient was brought to the ED for further evaluation.  Workup revealed ESBL UTI and patient completed antibiotic course with IV ertapenem.  Currently awaiting placement.  Assessment & Plan:   ESBL E. coli UTI Completed 5-day course of ertapenem.  Hypernatremia Presenting with a sodium level of 167, likely in the setting of poor oral intake/dehydration from hypovolemia.  Patient was started on IV fluid hydration with normalization of sodium level.  Continue to encourage increased oral intake.  Unfortunately, hyponatremia confers a very poor prognosis in the setting of dementia and palliative care was consulted; however family not ready for hospice at this time.  Sodium 138 on 1/17. -- Continue intermittent BMPs  Thrombocytosis Etiology in the setting of active infection as above.  Now resolved.  Hypokalemia Repleted, now resolved.  Acute renal failure: Resolved Presented with a creatinine 1.1, baseline 0.7.  Etiology likely secondary to severe dehydration, now resolved following IV fluid hydration.  Transaminitis Improved with IV fluid hydration.  Hypoalbuminemia In the setting of poor oral intake, underlying dementia.  Continue to encourage increased oral intake.  Iron deficiency anemia Hemoglobin stable, continue intermittent monitoring of H&H.  Permanent atrial fibrillation -- Flecainide 50 mg p.o. twice daily -- Eliquis 5 mg p.o. twice daily -- Holding home metoprolol in the setting of soft blood pressures  Insomnia --  Melatonin 5 mg p.o. nightly as needed insomnia  Alzheimer's dementia --Delirium precautions --Get up during the day --Encourage a familiar face to remain present throughout the day --Keep blinds open and lights on during daylight hours --Minimize the use of opioids/benzodiazepines -- Evaluated by palliative care, family not ready for patient to enroll in hospice services yet.  Disposition Per social work, patient cannot return to her previous SNF due to financial issues, they will also need to pay 30 days out from which spouse is unable to do so.  No family to assist at home.  Difficult placement, Medicaid application pending.  Currently medically stable for discharge once safe discharge plan acquired.  DVT prophylaxis: SCDs Start: 08/25/22 0659 apixaban (ELIQUIS) tablet 5 mg    Code Status: DNR Family Communication: No family present at bedside this morning  Disposition Plan:  Level of care: Med-Surg Status is: Inpatient Remains inpatient appropriate because: Medically stable for discharge once safe disposition found by social work    Consultants:  Palliative care  Procedures:  None  Antimicrobials:  Ertapenem 12/30 - 1/4  Ceftriaxone 12/29 - 12/30   Subjective: Patient seen examined bedside, resting comfortably.  Pleasantly confused.  No complaints this morning.  Remains medically stable for discharge once disposition found per TOC.  Denies headache, no chest pain, no shortness of breath, no abdominal pain.  No acute events overnight per nursing staff.  Objective: Vitals:   09/17/22 2125 09/18/22 0007 09/18/22 0529 09/18/22 0736  BP: 100/67 (!) 95/58 (!) 107/58 100/66  Pulse: (!) 113 (!) 110 (!) 104 100  Resp: 17 19 18 18   Temp: 98.3 F (36.8 C)  98.9 F (37.2 C) 98.2 F (36.8  C)  TempSrc: Oral  Oral Oral  SpO2: 98% 100% 99% 91%  Weight:      Height:        Intake/Output Summary (Last 24 hours) at 09/18/2022 1212 Last data filed at 09/18/2022 0553 Gross per 24  hour  Intake 240 ml  Output 350 ml  Net -110 ml   Filed Weights   08/30/22 1300  Weight: 50.2 kg    Examination:  Physical Exam: GEN: NAD, alert,, pleasantly confused, thin/cachectic in appearance, HEENT: NCAT, PERRL, EOMI, sclera clear, dry mucous membranes PULM: CTAB w/o wheezes/crackles, normal respiratory effort, on room air CV: RRR w/o M/G/R GI: abd soft, NTND, NABS, no R/G/M MSK: no peripheral edema Integumentary: dry/intact, no rashes or wounds    Data Reviewed: I have personally reviewed following labs and imaging studies  CBC: Recent Labs  Lab 09/15/22 0308  WBC 8.6  NEUTROABS 7.1  HGB 8.9*  HCT 28.3*  MCV 77.5*  PLT 161*   Basic Metabolic Panel: Recent Labs  Lab 09/15/22 0308 09/17/22 0325  NA 138 138  K 3.9 3.5  CL 99 100  CO2 27 29  GLUCOSE 108* 89  BUN 15 10  CREATININE 0.56 0.59  CALCIUM 8.8* 8.6*  MG 1.9 2.0   GFR: Estimated Creatinine Clearance: 45.1 mL/min (by C-G formula based on SCr of 0.59 mg/dL). Liver Function Tests: Recent Labs  Lab 09/15/22 0308 09/17/22 0325  AST 44* 11*  ALT 35 21  ALKPHOS 70 67  BILITOT 0.5 0.4  PROT 6.8 6.1*  ALBUMIN 2.4* 2.0*   No results for input(s): "LIPASE", "AMYLASE" in the last 168 hours. No results for input(s): "AMMONIA" in the last 168 hours. Coagulation Profile: No results for input(s): "INR", "PROTIME" in the last 168 hours. Cardiac Enzymes: No results for input(s): "CKTOTAL", "CKMB", "CKMBINDEX", "TROPONINI" in the last 168 hours. BNP (last 3 results) No results for input(s): "PROBNP" in the last 8760 hours. HbA1C: No results for input(s): "HGBA1C" in the last 72 hours. CBG: No results for input(s): "GLUCAP" in the last 168 hours. Lipid Profile: No results for input(s): "CHOL", "HDL", "LDLCALC", "TRIG", "CHOLHDL", "LDLDIRECT" in the last 72 hours. Thyroid Function Tests: No results for input(s): "TSH", "T4TOTAL", "FREET4", "T3FREE", "THYROIDAB" in the last 72 hours. Anemia  Panel: No results for input(s): "VITAMINB12", "FOLATE", "FERRITIN", "TIBC", "IRON", "RETICCTPCT" in the last 72 hours. Sepsis Labs: No results for input(s): "PROCALCITON", "LATICACIDVEN" in the last 168 hours.  No results found for this or any previous visit (from the past 240 hour(s)).       Radiology Studies: No results found.      Scheduled Meds:  apixaban  5 mg Oral BID   feeding supplement  237 mL Oral TID BM   ferrous sulfate  325 mg Oral QODAY   flecainide  50 mg Oral BID   multivitamin with minerals  1 tablet Oral Daily   pantoprazole  40 mg Oral Daily   potassium chloride  20 mEq Oral BID   Continuous Infusions:   LOS: 23 days    Time spent: 48 minutes spent on chart review, discussion with nursing staff, consultants, updating family and interview/physical exam; more than 50% of that time was spent in counseling and/or coordination of care.    Tuvia Woodrick J [place] DO [facility] Available via Epic secure chat 7am-7pm After these hours, please refer to coverage provider listed on [email or web address] 09/18/2022, 12:12 PM

## 2022-09-19 DIAGNOSIS — E870 Hyperosmolality and hypernatremia: Secondary | ICD-10-CM | POA: Diagnosis not present

## 2022-09-19 NOTE — Progress Notes (Signed)
PROGRESS NOTE    Donna Simmons  [id number]:[id number] DOB: 13-Mar-1941 DOA: 08/25/2022 PCP: [name], DO    Brief Narrative:   Donna Simmons is a 82 y.o. female with past medical history significant for Alzheimer's dementia, HTN, HLD, permanent atrial fibrillation on EliquisWho presented to [facility] ED on 12/25 from [facility] for failure to thrive.  Patient with decreased oral intake over the past 3 days with increased lethargy.  EMS was activated and patient was brought to the ED for further evaluation.  Workup revealed ESBL UTI and patient completed antibiotic course with IV ertapenem.  Currently awaiting placement.  Assessment & Plan:   ESBL E. coli UTI Completed 5-day course of ertapenem.  Hypernatremia Presenting with a sodium level of 167, likely in the setting of poor oral intake/dehydration from hypovolemia.  Patient was started on IV fluid hydration with normalization of sodium level.  Continue to encourage increased oral intake.  Unfortunately, hyponatremia confers a very poor prognosis in the setting of dementia and palliative care was consulted; however family not ready for hospice at this time.  Sodium 138 on 1/17. -- Continue intermittent BMPs  Thrombocytosis Etiology in the setting of active infection as above.  Now resolved.  Hypokalemia Repleted, now resolved.  Acute renal failure: Resolved Presented with a creatinine 1.1, baseline 0.7.  Etiology likely secondary to severe dehydration, now resolved following IV fluid hydration.  Transaminitis Improved with IV fluid hydration.  Hypoalbuminemia In the setting of poor oral intake, underlying dementia.  Continue to encourage increased oral intake.  Iron deficiency anemia Hemoglobin stable, continue intermittent monitoring of H&H.  Permanent atrial fibrillation -- Flecainide 50 mg p.o. twice daily -- Eliquis 5 mg p.o. twice daily -- Holding home metoprolol in the setting of soft blood pressures  Insomnia --  Melatonin 5 mg p.o. nightly as needed insomnia  Alzheimer's dementia --Delirium precautions --Get up during the day --Encourage a familiar face to remain present throughout the day --Keep blinds open and lights on during daylight hours --Minimize the use of opioids/benzodiazepines -- Evaluated by palliative care, family not ready for patient to enroll in hospice services yet.  Severe protein calorie malnutrition Body mass index is 20.24 kg/m. Nutrition Status: Nutrition Problem: Severe Malnutrition Etiology: chronic illness Signs/Symptoms: severe muscle depletion, severe fat depletion Interventions: Ensure Enlive (each supplement provides 350kcal and 20 grams of protein), MVI -- Continue to encourage increased oral intake  Disposition Per social work, patient cannot return to her previous SNF due to financial issues, they will also need to pay 30 days out from which spouse is unable to do so.  No family to assist at home.  Difficult placement, Medicaid application pending.  Currently medically stable for discharge once safe discharge plan acquired.  DVT prophylaxis: SCDs Start: 08/25/22 0659 apixaban (ELIQUIS) tablet 5 mg    Code Status: DNR Family Communication: No family present at bedside this morning  Disposition Plan:  Level of care: Med-Surg Status is: Inpatient Remains inpatient appropriate because: Medically stable for discharge once safe disposition found by social work    Consultants:  Palliative care  Procedures:  None  Antimicrobials:  Ertapenem 12/30 - 1/4  Ceftriaxone 12/29 - 12/30   Subjective: Patient seen examined bedside, resting comfortably.  Sleeping but easily arousable.  Pleasantly confused.  No complaints this morning.  Remains medically stable for discharge once disposition found per TOC.  Denies headache, no chest pain, no shortness of breath, no abdominal pain.  No acute events overnight per  nursing staff.  Objective: Vitals:   09/18/22 1548  09/18/22 2116 09/19/22 0527 09/19/22 1114  BP: 101/67 104/72 109/69 102/72  Pulse: (!) 101 (!) 105 (!) 106 100  Resp: 16 18 16 18   Temp: 97.6 F (36.4 C) 97.6 F (36.4 C)  97.9 F (36.6 C)  TempSrc: Oral Oral  Oral  SpO2: 100% 100% 100% 92%  Weight:      Height:        Intake/Output Summary (Last 24 hours) at 09/19/2022 1310 Last data filed at 09/19/2022 09/21/2022 Gross per 24 hour  Intake --  Output 350 ml  Net -350 ml   Filed Weights   08/30/22 1300  Weight: 50.2 kg    Examination:  Physical Exam: GEN: NAD, alert,, pleasantly confused, thin/cachectic in appearance, HEENT: NCAT, PERRL, EOMI, sclera clear, dry mucous membranes PULM: CTAB w/o wheezes/crackles, normal respiratory effort, on room air CV: RRR w/o M/G/R GI: abd soft, NTND, NABS, no R/G/M MSK: no peripheral edema Integumentary: dry/intact, no rashes or wounds    Data Reviewed: I have personally reviewed following labs and imaging studies  CBC: Recent Labs  Lab 09/15/22 0308  WBC 8.6  NEUTROABS 7.1  HGB 8.9*  HCT 28.3*  MCV 77.5*  PLT 503*   Basic Metabolic Panel: Recent Labs  Lab 09/15/22 0308 09/17/22 0325  NA 138 138  K 3.9 3.5  CL 99 100  CO2 27 29  GLUCOSE 108* 89  BUN 15 10  CREATININE 0.56 0.59  CALCIUM 8.8* 8.6*  MG 1.9 2.0   GFR: Estimated Creatinine Clearance: 45.1 mL/min (by C-G formula based on SCr of 0.59 mg/dL). Liver Function Tests: Recent Labs  Lab 09/15/22 0308 09/17/22 0325  AST 44* 11*  ALT 35 21  ALKPHOS 70 67  BILITOT 0.5 0.4  PROT 6.8 6.1*  ALBUMIN 2.4* 2.0*   No results for input(s): "LIPASE", "AMYLASE" in the last 168 hours. No results for input(s): "AMMONIA" in the last 168 hours. Coagulation Profile: No results for input(s): "INR", "PROTIME" in the last 168 hours. Cardiac Enzymes: No results for input(s): "CKTOTAL", "CKMB", "CKMBINDEX", "TROPONINI" in the last 168 hours. BNP (last 3 results) No results for input(s): "PROBNP" in the last 8760  hours. HbA1C: No results for input(s): "HGBA1C" in the last 72 hours. CBG: No results for input(s): "GLUCAP" in the last 168 hours. Lipid Profile: No results for input(s): "CHOL", "HDL", "LDLCALC", "TRIG", "CHOLHDL", "LDLDIRECT" in the last 72 hours. Thyroid Function Tests: No results for input(s): "TSH", "T4TOTAL", "FREET4", "T3FREE", "THYROIDAB" in the last 72 hours. Anemia Panel: No results for input(s): "VITAMINB12", "FOLATE", "FERRITIN", "TIBC", "IRON", "RETICCTPCT" in the last 72 hours. Sepsis Labs: No results for input(s): "PROCALCITON", "LATICACIDVEN" in the last 168 hours.  No results found for this or any previous visit (from the past 240 hour(s)).       Radiology Studies: No results found.      Scheduled Meds:  apixaban  5 mg Oral BID   feeding supplement  237 mL Oral TID BM   ferrous sulfate  325 mg Oral QODAY   flecainide  50 mg Oral BID   multivitamin with minerals  1 tablet Oral Daily   pantoprazole  40 mg Oral Daily   Continuous Infusions:   LOS: 24 days    Time spent: 48 minutes spent on chart review, discussion with nursing staff, consultants, updating family and interview/physical exam; more than 50% of that time was spent in counseling and/or coordination of care.  Anoushka Divito J [place] DO [facility] Available via Epic secure chat 7am-7pm After these hours, please refer to coverage provider listed on [email or web address] 09/19/2022, 1:10 PM

## 2022-09-20 DIAGNOSIS — E870 Hyperosmolality and hypernatremia: Secondary | ICD-10-CM | POA: Diagnosis not present

## 2022-09-20 NOTE — Progress Notes (Signed)
PROGRESS NOTE    Donna Simmons  PPI:[id number] DOB: Nov 30, 1940 DOA: 08/25/2022 PCP: [name], DO    Brief Narrative:   Donna Simmons is a 82 y.o. female with past medical history significant for Alzheimer's dementia, HTN, HLD, permanent atrial fibrillation on EliquisWho presented [facility] from [facility] for failure to thrive.  Patient with decreased oral intake over the past 3 days with increased lethargy.  EMS was activated and patient was brought to the ED for further evaluation.  Workup revealed ESBL UTI and patient completed antibiotic course with IV ertapenem.  Currently awaiting placement.  Assessment & Plan:   ESBL E. coli UTI Completed 5-day course of ertapenem.  Hypernatremia Presenting with a sodium level of 167, likely in the setting of poor oral intake/dehydration from hypovolemia.  Patient was started on IV fluid hydration with normalization of sodium level.  Continue to encourage increased oral intake.  Unfortunately, hyponatremia confers a very poor prognosis in the setting of dementia and palliative care was consulted; however family not ready for hospice at this time.  Sodium 138 on 1/17. -- Continue intermittent BMPs  Thrombocytosis Etiology in the setting of active infection as above.  Now resolved.  Hypokalemia Repleted, now resolved.  Acute renal failure: Resolved Presented with a creatinine 1.1, baseline 0.7.  Etiology likely secondary to severe dehydration, now resolved following IV fluid hydration.  Transaminitis Improved with IV fluid hydration.  Hypoalbuminemia In the setting of poor oral intake, underlying dementia.  Continue to encourage increased oral intake.  Iron deficiency anemia Hemoglobin stable, continue intermittent monitoring of H&H.  Permanent atrial fibrillation -- Flecainide 50 mg p.o. twice daily -- Eliquis 5 mg p.o. twice daily -- Holding home metoprolol in the setting of soft blood pressures  Insomnia --  Melatonin 5 mg p.o. nightly as needed insomnia  Alzheimer's dementia --Delirium precautions --Get up during the day --Encourage a familiar face to remain present throughout the day --Keep blinds open and lights on during daylight hours --Minimize the use of opioids/benzodiazepines -- Evaluated by palliative care, family not ready for patient to enroll in hospice services yet.  Severe protein calorie malnutrition Body mass index is 20.24 kg/m. Nutrition Status: Nutrition Problem: Severe Malnutrition Etiology: chronic illness Signs/Symptoms: severe muscle depletion, severe fat depletion Interventions: Ensure Enlive (each supplement provides 350kcal and 20 grams of protein), MVI -- Continue to encourage increased oral intake  Disposition Per social work, patient cannot return to her previous SNF due to financial issues, they will also need to pay 30 days out from which spouse is unable to do so.  No family to assist at home.  Difficult placement, Medicaid application pending.  Currently medically stable for discharge once safe discharge plan acquired.  DVT prophylaxis: SCDs Start: 08/25/22 0659 apixaban (ELIQUIS) tablet 5 mg    Code Status: DNR Family Communication: No family present at bedside this morning  Disposition Plan:  Level of care: Med-Surg Status is: Inpatient Remains inpatient appropriate because: Medically stable for discharge once safe disposition found by social work    Consultants:  Palliative care  Procedures:  None  Antimicrobials:  Ertapenem 12/30 - 1/4  Ceftriaxone 12/29 - 12/30   Subjective: Patient seen examined bedside, resting comfortably.  Sleeping but easily arousable.  Pleasantly confused.  No complaints this morning.  Remains medically stable for discharge once disposition found per TOC.  Denies headache, no chest pain, no shortness of breath, no abdominal pain.  No acute events overnight per  nursing staff.  Objective: Vitals:   09/19/22 1944  09/20/22 0402 09/20/22 0821 09/20/22 0823  BP: 91/72 117/70 109/85   Pulse: 92 95 (!) 111 (!) 109  Resp: 16  17   Temp: 98.7 F (37.1 C) 98.6 F (37 C) 98.4 F (36.9 C)   TempSrc: Axillary Oral Oral   SpO2: 98% 100% 100% 100%  Weight:      Height:        Intake/Output Summary (Last 24 hours) at 09/20/2022 1111 Last data filed at 09/20/2022 0600 Gross per 24 hour  Intake 360 ml  Output --  Net 360 ml   Filed Weights   08/30/22 1300  Weight: 50.2 kg    Examination:  Physical Exam: GEN: NAD, alert,, pleasantly confused, thin/cachectic in appearance, HEENT: NCAT, PERRL, EOMI, sclera clear, dry mucous membranes PULM: CTAB w/o wheezes/crackles, normal respiratory effort, on room air CV: RRR w/o M/G/R GI: abd soft, NTND, NABS, no R/G/M MSK: no peripheral edema Integumentary: dry/intact, no rashes or wounds    Data Reviewed: I have personally reviewed following labs and imaging studies  CBC: Recent Labs  Lab 09/15/22 0308  WBC 8.6  NEUTROABS 7.1  HGB 8.9*  HCT 28.3*  MCV 77.5*  PLT 413*   Basic Metabolic Panel: Recent Labs  Lab 09/15/22 0308 09/17/22 0325  NA 138 138  K 3.9 3.5  CL 99 100  CO2 27 29  GLUCOSE 108* 89  BUN 15 10  CREATININE 0.56 0.59  CALCIUM 8.8* 8.6*  MG 1.9 2.0   GFR: Estimated Creatinine Clearance: 45.1 mL/min (by C-G formula based on SCr of 0.59 mg/dL). Liver Function Tests: Recent Labs  Lab 09/15/22 0308 09/17/22 0325  AST 44* 11*  ALT 35 21  ALKPHOS 70 67  BILITOT 0.5 0.4  PROT 6.8 6.1*  ALBUMIN 2.4* 2.0*   No results for input(s): "LIPASE", "AMYLASE" in the last 168 hours. No results for input(s): "AMMONIA" in the last 168 hours. Coagulation Profile: No results for input(s): "INR", "PROTIME" in the last 168 hours. Cardiac Enzymes: No results for input(s): "CKTOTAL", "CKMB", "CKMBINDEX", "TROPONINI" in the last 168 hours. BNP (last 3 results) No results for input(s): "PROBNP" in the last 8760 hours. HbA1C: No  results for input(s): "HGBA1C" in the last 72 hours. CBG: No results for input(s): "GLUCAP" in the last 168 hours. Lipid Profile: No results for input(s): "CHOL", "HDL", "LDLCALC", "TRIG", "CHOLHDL", "LDLDIRECT" in the last 72 hours. Thyroid Function Tests: No results for input(s): "TSH", "T4TOTAL", "FREET4", "T3FREE", "THYROIDAB" in the last 72 hours. Anemia Panel: No results for input(s): "VITAMINB12", "FOLATE", "FERRITIN", "TIBC", "IRON", "RETICCTPCT" in the last 72 hours. Sepsis Labs: No results for input(s): "PROCALCITON", "LATICACIDVEN" in the last 168 hours.  No results found for this or any previous visit (from the past 240 hour(s)).       Radiology Studies: No results found.      Scheduled Meds:  apixaban  5 mg Oral BID   feeding supplement  237 mL Oral TID BM   ferrous sulfate  325 mg Oral QODAY   flecainide  50 mg Oral BID   multivitamin with minerals  1 tablet Oral Daily   pantoprazole  40 mg Oral Daily   Continuous Infusions:   LOS: 25 days    Time spent: 48 minutes spent on chart review, discussion with nursing staff, consultants, updating family and interview/physical exam; more than 50% of that time was spent in counseling and/or coordination of care.    Randall Hiss  J [place], DO [facility] Available via Epic secure chat 7am-7pm After these hours, please refer to coverage provider listed on [email or web address] 09/20/2022, 11:11 AM

## 2022-09-21 DIAGNOSIS — E870 Hyperosmolality and hypernatremia: Secondary | ICD-10-CM | POA: Diagnosis not present

## 2022-09-21 NOTE — Progress Notes (Signed)
PROGRESS NOTE    [name]  HTD:[id number] DOB: 1941/05/17 DOA: 08/25/2022 PCP: [name], DO    Brief Narrative:   Donna Simmons is a 82 y.o. female with past medical history significant for Alzheimer's dementia, HTN, HLD, permanent atrial fibrillation on EliquisWho [facility] from [facility] for failure to thrive.  Patient with decreased oral intake over the past 3 days with increased lethargy.  EMS was activated and patient was brought to the ED for further evaluation.  Workup revealed ESBL UTI and patient completed antibiotic course with IV ertapenem.  Currently awaiting placement.  Assessment & Plan:   ESBL E. coli UTI Completed 5-day course of ertapenem.  Hypernatremia Presenting with a sodium level of 167, likely in the setting of poor oral intake/dehydration from hypovolemia.  Patient was started on IV fluid hydration with normalization of sodium level.  Continue to encourage increased oral intake.  Unfortunately, hyponatremia confers a very poor prognosis in the setting of dementia and palliative care was consulted; however family not ready for hospice at this time.  Sodium 138 on 1/17. -- Continue intermittent BMPs  Thrombocytosis Etiology in the setting of active infection as above.  Now resolved.  Hypokalemia Repleted, now resolved.  Acute renal failure: Resolved Presented with a creatinine 1.1, baseline 0.7.  Etiology likely secondary to severe dehydration, now resolved following IV fluid hydration.  Transaminitis Improved with IV fluid hydration.  Hypoalbuminemia In the setting of poor oral intake, underlying dementia.  Continue to encourage increased oral intake.  Iron deficiency anemia Hemoglobin stable, continue intermittent monitoring of H&H.  Permanent atrial fibrillation -- Flecainide 50 mg p.o. twice daily -- Eliquis 5 mg p.o. twice daily -- Holding home metoprolol in the setting of soft blood pressures  Insomnia --  Melatonin 5 mg p.o. nightly as needed insomnia  Alzheimer's dementia --Delirium precautions --Get up during the day --Encourage a familiar face to remain present throughout the day --Keep blinds open and lights on during daylight hours --Minimize the use of opioids/benzodiazepines -- Evaluated by palliative care, family not ready for patient to enroll in hospice services yet.  Severe protein calorie malnutrition Body mass index is 20.24 kg/m. Nutrition Status: Nutrition Problem: Severe Malnutrition Etiology: chronic illness Signs/Symptoms: severe muscle depletion, severe fat depletion Interventions: Ensure Enlive (each supplement provides 350kcal and 20 grams of protein), MVI -- Continue to encourage increased oral intake  Disposition Per social work, patient cannot return to her previous SNF due to financial issues, they will also need to pay 30 days out from which spouse is unable to do so.  No family to assist at home.  Difficult placement, Medicaid application pending.  Currently medically stable for discharge once safe discharge plan acquired.  DVT prophylaxis: SCDs Start: 08/25/22 0659 apixaban (ELIQUIS) tablet 5 mg    Code Status: DNR Family Communication: Updated husband present at bedside this morning  Disposition Plan:  Level of care: Med-Surg Status is: Inpatient Remains inpatient appropriate because: Medically stable for discharge once safe disposition found by social work    Consultants:  Palliative care  Procedures:  None  Antimicrobials:  Ertapenem 12/30 - 1/4  Ceftriaxone 12/29 - 12/30   Subjective: Patient seen examined bedside, resting comfortably.  Sleeping but easily arousable.  Pleasantly confused.  No complaints this morning.  Husband present at bedside.  He reports that his wife was a previous graduate of [employer] medically stable for discharge once disposition found per  TOC.  Denies headache, no chest pain, no shortness  of breath, no abdominal pain.  No acute events overnight per nursing staff.  Objective: Vitals:   09/20/22 1532 09/20/22 2052 09/21/22 0452 09/21/22 0700  BP:  93/83 (!) 97/59 99/63  Pulse: (!) 109 (!) 119 (!) 122 100  Resp:   20 17  Temp:  99.4 F (37.4 C) 98.2 F (36.8 C) 98 F (36.7 C)  TempSrc:  Oral Oral Oral  SpO2: 100% 100% 98% 99%  Weight:      Height:        Intake/Output Summary (Last 24 hours) at 09/21/2022 1006 Last data filed at 09/20/2022 1535 Gross per 24 hour  Intake 480 ml  Output 150 ml  Net 330 ml   Filed Weights   08/30/22 1300  Weight: 50.2 kg    Examination:  Physical Exam: GEN: NAD, alert,, pleasantly confused, thin/cachectic in appearance HEENT: NCAT, PERRL, EOMI, sclera clear, dry mucous membranes PULM: CTAB w/o wheezes/crackles, normal respiratory effort, on room air CV: RRR w/o M/G/R GI: abd soft, NTND, NABS, no R/G/M MSK: no peripheral edema Integumentary: dry/intact, no rashes or wounds    Data Reviewed: I have personally reviewed following labs and imaging studies  CBC: Recent Labs  Lab 09/15/22 0308  WBC 8.6  NEUTROABS 7.1  HGB 8.9*  HCT 28.3*  MCV 77.5*  PLT 270*   Basic Metabolic Panel: Recent Labs  Lab 09/15/22 0308 09/17/22 0325  NA 138 138  K 3.9 3.5  CL 99 100  CO2 27 29  GLUCOSE 108* 89  BUN 15 10  CREATININE 0.56 0.59  CALCIUM 8.8* 8.6*  MG 1.9 2.0   GFR: Estimated Creatinine Clearance: 45.1 mL/min (by C-G formula based on SCr of 0.59 mg/dL). Liver Function Tests: Recent Labs  Lab 09/15/22 0308 09/17/22 0325  AST 44* 11*  ALT 35 21  ALKPHOS 70 67  BILITOT 0.5 0.4  PROT 6.8 6.1*  ALBUMIN 2.4* 2.0*   No results for input(s): "LIPASE", "AMYLASE" in the last 168 hours. No results for input(s): "AMMONIA" in the last 168 hours. Coagulation Profile: No results for input(s): "INR", "PROTIME" in the last 168 hours. Cardiac Enzymes: No results for input(s): "CKTOTAL", "CKMB", "CKMBINDEX", "TROPONINI"  in the last 168 hours. BNP (last 3 results) No results for input(s): "PROBNP" in the last 8760 hours. HbA1C: No results for input(s): "HGBA1C" in the last 72 hours. CBG: No results for input(s): "GLUCAP" in the last 168 hours. Lipid Profile: No results for input(s): "CHOL", "HDL", "LDLCALC", "TRIG", "CHOLHDL", "LDLDIRECT" in the last 72 hours. Thyroid Function Tests: No results for input(s): "TSH", "T4TOTAL", "FREET4", "T3FREE", "THYROIDAB" in the last 72 hours. Anemia Panel: No results for input(s): "VITAMINB12", "FOLATE", "FERRITIN", "TIBC", "IRON", "RETICCTPCT" in the last 72 hours. Sepsis Labs: No results for input(s): "PROCALCITON", "LATICACIDVEN" in the last 168 hours.  No results found for this or any previous visit (from the past 240 hour(s)).       Radiology Studies: No results found.      Scheduled Meds:  apixaban  5 mg Oral BID   feeding supplement  237 mL Oral TID BM   ferrous sulfate  325 mg Oral QODAY   flecainide  50 mg Oral BID   multivitamin with minerals  1 tablet Oral Daily   pantoprazole  40 mg Oral Daily   Continuous Infusions:   LOS: 26 days    Time spent: 48 minutes spent on chart review, discussion with nursing staff, consultants,  updating family and interview/physical exam; more than 50% of that time was spent in counseling and/or coordination of care.    Alvira Philips [place], DO [facility] Available via Epic secure chat 7am-7pm After these hours, please refer to coverage provider listed on [email or web address] 09/21/2022, 10:06 AM

## 2022-09-21 NOTE — Plan of Care (Signed)

## 2022-09-22 DIAGNOSIS — E870 Hyperosmolality and hypernatremia: Secondary | ICD-10-CM | POA: Diagnosis not present

## 2022-09-22 DIAGNOSIS — F03C0 Unspecified dementia, severe, without behavioral disturbance, psychotic disturbance, mood disturbance, and anxiety: Secondary | ICD-10-CM | POA: Diagnosis not present

## 2022-09-22 DIAGNOSIS — R627 Adult failure to thrive: Secondary | ICD-10-CM

## 2022-09-22 DIAGNOSIS — N179 Acute kidney failure, unspecified: Secondary | ICD-10-CM | POA: Diagnosis not present

## 2022-09-22 DIAGNOSIS — Z515 Encounter for palliative care: Secondary | ICD-10-CM

## 2022-09-22 DIAGNOSIS — E8809 Other disorders of plasma-protein metabolism, not elsewhere classified: Secondary | ICD-10-CM | POA: Diagnosis not present

## 2022-09-22 LAB — CBC
HCT: 29.7 % — ABNORMAL LOW (ref 36.0–46.0)
Hemoglobin: 8.8 g/dL — ABNORMAL LOW (ref 12.0–15.0)
MCH: 23.6 pg — ABNORMAL LOW (ref 26.0–34.0)
MCHC: 29.6 g/dL — ABNORMAL LOW (ref 30.0–36.0)
MCV: 79.6 fL — ABNORMAL LOW (ref 80.0–100.0)
Platelets: 643 10*3/uL — ABNORMAL HIGH (ref 150–400)
RBC: 3.73 MIL/uL — ABNORMAL LOW (ref 3.87–5.11)
RDW: 17.6 % — ABNORMAL HIGH (ref 11.5–15.5)
WBC: 7.9 10*3/uL (ref 4.0–10.5)
nRBC: 0 % (ref 0.0–0.2)

## 2022-09-22 LAB — BASIC METABOLIC PANEL
Anion gap: 10 (ref 5–15)
BUN: 20 mg/dL (ref 8–23)
CO2: 26 mmol/L (ref 22–32)
Calcium: 8.7 mg/dL — ABNORMAL LOW (ref 8.9–10.3)
Chloride: 104 mmol/L (ref 98–111)
Creatinine, Ser: 0.53 mg/dL (ref 0.44–1.00)
GFR, Estimated: >60 mL/min (ref >60)
Glucose, Bld: 104 mg/dL — ABNORMAL HIGH (ref 70–99)
Potassium: 3.8 mmol/L (ref 3.5–5.1)
Sodium: 140 mmol/L (ref 135–145)

## 2022-09-22 LAB — MAGNESIUM: Magnesium: 2.1 mg/dL (ref 1.7–2.4)

## 2022-09-22 NOTE — Progress Notes (Signed)
PROGRESS NOTE    Donna Simmons  LKG:[id number] DOB: 03-10-1941 DOA: 08/25/2022 PCP: [name], DO    Brief Narrative:   Donna Simmons is a 82 y.o. female with past medical history significant for Alzheimer's dementia, HTN, HLD, permanent atrial fibrillation on EliquisWho presented to [facility] ED on 12/25 from [facility] for failure to thrive.  Patient with decreased oral intake over the past 3 days with increased lethargy.  EMS was activated and patient was brought to the ED for further evaluation.  Workup revealed ESBL UTI and patient completed antibiotic course with IV ertapenem.  Currently awaiting placement.  Assessment & Plan:   ESBL E. coli UTI Completed 5-day course of ertapenem.  Hypernatremia Presenting with a sodium level of 167, likely in the setting of poor oral intake/dehydration from hypovolemia.  Patient was started on IV fluid hydration with normalization of sodium level.  Continue to encourage increased oral intake.  Unfortunately, hyponatremia confers a very poor prognosis in the setting of dementia and palliative care was consulted; however family not ready for hospice at this time.  Sodium 140 on 1/22. -- Continue intermittent BMPs  Thrombocytosis Etiology in the setting of active infection as above.  Now resolved.  Hypokalemia Repleted, now resolved.  Acute renal failure: Resolved Presented with a creatinine 1.1, baseline 0.7.  Etiology likely secondary to severe dehydration, now resolved following IV fluid hydration.  Creatinine 0.53 on 1/22.  Transaminitis Improved with IV fluid hydration.  Hypoalbuminemia In the setting of poor oral intake, underlying dementia.  Continue to encourage increased oral intake.  Iron deficiency anemia Hemoglobin stable, continue intermittent monitoring of H&H.  Permanent atrial fibrillation -- Flecainide 50 mg p.o. twice daily -- Eliquis 5 mg p.o. twice daily -- Holding home metoprolol in the setting of soft blood  pressures  Insomnia -- Melatonin 5 mg p.o. nightly as needed insomnia  Alzheimer's dementia --Delirium precautions --Get up during the day --Encourage a familiar face to remain present throughout the day --Keep blinds open and lights on during daylight hours --Minimize the use of opioids/benzodiazepines -- Evaluated by palliative care, family not ready for patient to enroll in hospice services yet.  Severe protein calorie malnutrition Body mass index is 20.24 kg/m. Nutrition Status: Nutrition Problem: Severe Malnutrition Etiology: chronic illness Signs/Symptoms: severe muscle depletion, severe fat depletion Interventions: Ensure Enlive (each supplement provides 350kcal and 20 grams of protein), MVI -- Continue to encourage increased oral intake  Disposition Per social work, patient cannot return to her previous SNF due to financial issues, they will also need to pay 30 days out from which spouse is unable to do so.  No family to assist at home.  Difficult placement, Medicaid application pending.  Currently medically stable for discharge once safe discharge plan acquired.  DVT prophylaxis: SCDs Start: 08/25/22 0659 apixaban (ELIQUIS) tablet 5 mg    Code Status: DNR Family Communication: Updated husband present at bedside yesterday morning, no family present this morning.  Disposition Plan:  Level of care: Med-Surg Status is: Inpatient Remains inpatient appropriate because: Medically stable for discharge once safe disposition found by social work    Consultants:  Palliative care  Procedures:  None  Antimicrobials:  Ertapenem 12/30 - 1/4  Ceftriaxone 12/29 - 12/30   Subjective: Patient seen examined bedside, resting comfortably.  Sleeping but easily arousable.  Pleasantly confused.  No complaints this morning.  No family present at bedside this morning.  Remains medically stable for discharge once disposition found per TOC.  Denies headache, no chest pain, no shortness  of breath, no abdominal pain.  No acute events overnight per nursing staff.  Objective: Vitals:   09/21/22 1614 09/21/22 2021 09/22/22 0418 09/22/22 0734  BP: 106/63 107/68 99/68 94/72   Pulse: (!) 104 (!) 110 (!) 110 98  Resp:    16  Temp: 98.4 F (36.9 C) 100 F (37.8 C) 99.3 F (37.4 C) (!) 97.3 F (36.3 C)  TempSrc: Oral Oral Oral Oral  SpO2: 99% 99% 100% 100%  Weight:      Height:       No intake or output data in the 24 hours ending 09/22/22 1100  Filed Weights   08/30/22 1300  Weight: 50.2 kg    Examination:  Physical Exam: GEN: NAD, alert,, pleasantly confused, thin/cachectic in appearance HEENT: NCAT, PERRL, EOMI, sclera clear, dry mucous membranes PULM: CTAB w/o wheezes/crackles, normal respiratory effort, on room air CV: RRR w/o M/G/R GI: abd soft, NTND, NABS, no R/G/M MSK: no peripheral edema Integumentary: dry/intact, no rashes or wounds    Data Reviewed: I have personally reviewed following labs and imaging studies  CBC: Recent Labs  Lab 09/22/22 0239  WBC 7.9  HGB 8.8*  HCT 29.7*  MCV 79.6*  PLT 643*   Basic Metabolic Panel: Recent Labs  Lab 09/17/22 0325 09/22/22 0239  NA 138 140  K 3.5 3.8  CL 100 104  CO2 29 26  GLUCOSE 89 104*  BUN 10 20  CREATININE 0.59 0.53  CALCIUM 8.6* 8.7*  MG 2.0 2.1   GFR: Estimated Creatinine Clearance: 45.1 mL/min (by C-G formula based on SCr of 0.53 mg/dL). Liver Function Tests: Recent Labs  Lab 09/17/22 0325  AST 11*  ALT 21  ALKPHOS 67  BILITOT 0.4  PROT 6.1*  ALBUMIN 2.0*   No results for input(s): "LIPASE", "AMYLASE" in the last 168 hours. No results for input(s): "AMMONIA" in the last 168 hours. Coagulation Profile: No results for input(s): "INR", "PROTIME" in the last 168 hours. Cardiac Enzymes: No results for input(s): "CKTOTAL", "CKMB", "CKMBINDEX", "TROPONINI" in the last 168 hours. BNP (last 3 results) No results for input(s): "PROBNP" in the last 8760 hours. HbA1C: No results  for input(s): "HGBA1C" in the last 72 hours. CBG: No results for input(s): "GLUCAP" in the last 168 hours. Lipid Profile: No results for input(s): "CHOL", "HDL", "LDLCALC", "TRIG", "CHOLHDL", "LDLDIRECT" in the last 72 hours. Thyroid Function Tests: No results for input(s): "TSH", "T4TOTAL", "FREET4", "T3FREE", "THYROIDAB" in the last 72 hours. Anemia Panel: No results for input(s): "VITAMINB12", "FOLATE", "FERRITIN", "TIBC", "IRON", "RETICCTPCT" in the last 72 hours. Sepsis Labs: No results for input(s): "PROCALCITON", "LATICACIDVEN" in the last 168 hours.  No results found for this or any previous visit (from the past 240 hour(s)).       Radiology Studies: No results found.      Scheduled Meds:  apixaban  5 mg Oral BID   feeding supplement  237 mL Oral TID BM   ferrous sulfate  325 mg Oral QODAY   flecainide  50 mg Oral BID   multivitamin with minerals  1 tablet Oral Daily   pantoprazole  40 mg Oral Daily   Continuous Infusions:   LOS: 27 days    Time spent: 48 minutes spent on chart review, discussion with nursing staff, consultants, updating family and interview/physical exam; more than 50% of that time was spent in counseling and/or coordination of care.    Alvira Philips [place], DO [facility] Available via  Epic secure chat 7am-7pm After these hours, please refer to coverage provider listed on [email or web address] 09/22/2022, 11:00 AM

## 2022-09-22 NOTE — Progress Notes (Signed)
Palliative Medicine Progress Note   Patient Name: Donna Simmons       Date: 09/22/2022 DOB: 08/21/1941  Age: 82 y.o. MRN#: [id number] Attending Physician: [place] Eric J, DO Primary Care Physician: [name], DO Admit Date: 08/25/2022    HPI/Patient Profile: 82 y.o. female  with past medical history of Alzheimer's dementia, A-fib on Eliquis, hypertension, and hyperlipidemia.  She presented to [facility] ED on 08/25/2022 from SNF with decreased PO intake and lethargy.  Sodium on admission was 166.  She was admitted to [facility] service with hypernatremia, dehydration, and failure to thrive. Additional problems include leukocytosis, thrombocytosis, AKI, hypoalbuminemia, and transaminitis. Palliative Medicine was consulted for goals of care  Subjective: Chart reviewed. Per I&O flowsheet, patient is eating/drinking some with total assistance. Per CSW note 1/17, spouse is working on [employer] application.   Bedside visit. Patient is resting, appears comfortable. States she is "ok" but does not answer additional questions.    Objective:  Physical Exam Vitals reviewed.  Constitutional:      General: She is awake. She is not in acute distress.    Comments: Frail and chronically ill-appearing  Pulmonary:     Effort: Pulmonary effort is normal.  Neurological:     Mental Status: She is confused.  Psychiatric:        Cognition and Memory: Cognition is impaired. Memory is impaired.             Vital Signs: BP 94/72 (BP Location: Left Arm)   Pulse 98   Temp (!) 97.3 F (36.3 C) (Oral)   Resp 16   Ht 5\' 2"  (1.575 m)   Wt 50.2 kg   SpO2 100%   BMI 20.24 kg/m  SpO2: SpO2: 100 % O2 Device: O2 Device: Room Air   Palliative Medicine Assessment & Plan   Assessment: Principal Problem:    Hypernatremia Active Problems:   GERD (gastroesophageal reflux disease)   Leukocytosis   Thrombocytosis   Hypokalemia   Hypoalbuminemia due to protein-calorie malnutrition (HCC)   Transaminitis   AKI (acute kidney injury) ([place])   Permanent atrial fibrillation (HCC)   Iron deficiency anemia   Alzheimer's dementia ([place])   Insomnia   Failure to thrive (adult)    Recommendations/Plan: DNR as previously documented Continue current supportive interventions Goal of care - return to SNF when  able to be placed Recommend outpatient palliative at discharge  Goals of Care and Additional Recommendations: Limitations on Scope of Treatment: No Artificial Feeding  Prognosis:  < 6 months would not be surprising   Thank you for allowing the Palliative Medicine Team to assist in the care of this patient.    Lavena Bullion, NP   Please contact Palliative Medicine Team phone at [phone] for questions and concerns.  For individual providers, please see AMION.

## 2022-09-23 DIAGNOSIS — E870 Hyperosmolality and hypernatremia: Secondary | ICD-10-CM | POA: Diagnosis not present

## 2022-09-23 NOTE — TOC Progression Note (Addendum)
Transition of Care [facility]) - Progression Note    Patient Details  Name: Donna Simmons MRN: [id number] Date of Birth: 03/09/1941  Transition of Care [facility]) CM/SW [place], Hayden Phone Number: 09/23/2022, 11:55 AM  Clinical Narrative:     CSW attempted to call pt spouse. Phone rang for 1-2 minutes with no answer and no voicemail. Call eventually disconnected.   1605: CSW called spouse to inquire about medicaid application. He stated he submitted the application on the 1/65/79. He reports he received call yesterday from DSS asking to verify a detail on the application and then he was informed that the application would be submitted to a worker to review. He did not have a case# or case worker name. CSW informed spouse to get case# and case worker name from [place] once it is available.        Expected Discharge Plan and Services                                               Social Determinants of Health (SDOH) Interventions SDOH Screenings   Food Insecurity: Unknown (07/03/2022)  Alcohol Screen: Low Risk  (04/12/2019)  Depression (PHQ2-9): Low Risk  (11/05/2020)  Tobacco Use: Low Risk  (08/20/2022)    Readmission Risk Interventions     No data to display

## 2022-09-23 NOTE — Progress Notes (Signed)
PROGRESS NOTE    Donna Simmons  YIR:[id number] DOB: August 22, 1941 DOA: 08/25/2022 PCP: [name], DO    Brief Narrative:   Donna Simmons is a 82 y.o. female with past medical history significant for Alzheimer's dementia, HTN, HLD, permanent atrial fibrillation on EliquisWho presented to [facility] ED on 12/25 from [facility] for failure to thrive.  Patient with decreased oral intake over the past 3 days with increased lethargy.  EMS was activated and patient was brought to the ED for further evaluation.  Workup revealed ESBL UTI and patient completed antibiotic course with IV ertapenem.  Currently awaiting placement.  Assessment & Plan:   ESBL E. coli UTI Completed 5-day course of ertapenem.  Hypernatremia Presenting with a sodium level of 167, likely in the setting of poor oral intake/dehydration from hypovolemia.  Patient was started on IV fluid hydration with normalization of sodium level.  Continue to encourage increased oral intake.  Unfortunately, hyponatremia confers a very poor prognosis in the setting of dementia and palliative care was consulted; however family not ready for hospice at this time.  Sodium 140 on 1/22. -- Continue intermittent BMPs  Thrombocytosis Etiology in the setting of active infection as above.  Now resolved.  Hypokalemia Repleted, now resolved.  Acute renal failure: Resolved Presented with a creatinine 1.1, baseline 0.7.  Etiology likely secondary to severe dehydration, now resolved following IV fluid hydration.  Creatinine 0.53 on 1/22.  Transaminitis Improved with IV fluid hydration.  Hypoalbuminemia In the setting of poor oral intake, underlying dementia.  Continue to encourage increased oral intake.  Iron deficiency anemia Hemoglobin stable, continue intermittent monitoring of H&H.  Permanent atrial fibrillation -- Flecainide 50 mg p.o. twice daily -- Eliquis 5 mg p.o. twice daily -- Holding home metoprolol in the setting of soft blood  pressures  Insomnia -- Melatonin 5 mg p.o. nightly as needed insomnia  Alzheimer's dementia --Delirium precautions --Get up during the day --Encourage a familiar face to remain present throughout the day --Keep blinds open and lights on during daylight hours --Minimize the use of opioids/benzodiazepines -- Evaluated by palliative care, family not ready for patient to enroll in hospice services yet.  Severe protein calorie malnutrition Body mass index is 20.24 kg/m. Nutrition Status: Nutrition Problem: Severe Malnutrition Etiology: chronic illness Signs/Symptoms: severe muscle depletion, severe fat depletion Interventions: Ensure Enlive (each supplement provides 350kcal and 20 grams of protein), MVI -- Continue to encourage increased oral intake  Disposition Per social work, patient cannot return to her previous SNF due to financial issues, they will also need to pay 30 days out from which spouse is unable to do so.  No family to assist at home.  Difficult placement, Medicaid application pending.  Currently medically stable for discharge once safe discharge plan acquired.  DVT prophylaxis: SCDs Start: 08/25/22 0659 apixaban (ELIQUIS) tablet 5 mg    Code Status: DNR Family Communication: Updated husband present at bedside yesterday morning, no family present this morning.  Disposition Plan:  Level of care: Med-Surg Status is: Inpatient Remains inpatient appropriate because: Medically stable for discharge once safe disposition found by social work    Consultants:  Palliative care  Procedures:  None  Antimicrobials:  Ertapenem 12/30 - 1/4  Ceftriaxone 12/29 - 12/30   Subjective: Patient seen examined bedside, resting comfortably.  Lying in bed watching TV.  Remains pleasantly confused.  No family present at bedside this morning.  No complaints or questions at this time.  Remains medically stable for discharge once  disposition found per TOC.  Denies headache, no chest  pain, no shortness of breath, no abdominal pain.  No acute events overnight per nursing staff.  Objective: Vitals:   09/22/22 1933 09/22/22 2000 09/23/22 0421 09/23/22 0811  BP: 100/70  104/68 104/68  Pulse: (!) 107 99 96 (!) 103  Resp: 18 18 16 16   Temp: 98.2 F (36.8 C)  (!) 97.5 F (36.4 C) 97.6 F (36.4 C)  TempSrc: Axillary  Axillary Axillary  SpO2: 95%  100% 100%  Weight:      Height:        Intake/Output Summary (Last 24 hours) at 09/23/2022 1219 Last data filed at 09/23/2022 09/25/2022 Gross per 24 hour  Intake 120 ml  Output --  Net 120 ml    Filed Weights   08/30/22 1300  Weight: 50.2 kg    Examination:  Physical Exam: GEN: NAD, alert,, pleasantly confused, thin/cachectic in appearance HEENT: NCAT, PERRL, EOMI, sclera clear, dry mucous membranes PULM: CTAB w/o wheezes/crackles, normal respiratory effort, on room air CV: RRR w/o M/G/R GI: abd soft, NTND, NABS, no R/G/M MSK: no peripheral edema Integumentary: dry/intact, no rashes or wounds    Data Reviewed: I have personally reviewed following labs and imaging studies  CBC: Recent Labs  Lab 09/22/22 0239  WBC 7.9  HGB 8.8*  HCT 29.7*  MCV 79.6*  PLT 643*   Basic Metabolic Panel: Recent Labs  Lab 09/17/22 0325 09/22/22 0239  NA 138 140  K 3.5 3.8  CL 100 104  CO2 29 26  GLUCOSE 89 104*  BUN 10 20  CREATININE 0.59 0.53  CALCIUM 8.6* 8.7*  MG 2.0 2.1   GFR: Estimated Creatinine Clearance: 45.1 mL/min (by C-G formula based on SCr of 0.53 mg/dL). Liver Function Tests: Recent Labs  Lab 09/17/22 0325  AST 11*  ALT 21  ALKPHOS 67  BILITOT 0.4  PROT 6.1*  ALBUMIN 2.0*   No results for input(s): "LIPASE", "AMYLASE" in the last 168 hours. No results for input(s): "AMMONIA" in the last 168 hours. Coagulation Profile: No results for input(s): "INR", "PROTIME" in the last 168 hours. Cardiac Enzymes: No results for input(s): "CKTOTAL", "CKMB", "CKMBINDEX", "TROPONINI" in the last 168  hours. BNP (last 3 results) No results for input(s): "PROBNP" in the last 8760 hours. HbA1C: No results for input(s): "HGBA1C" in the last 72 hours. CBG: No results for input(s): "GLUCAP" in the last 168 hours. Lipid Profile: No results for input(s): "CHOL", "HDL", "LDLCALC", "TRIG", "CHOLHDL", "LDLDIRECT" in the last 72 hours. Thyroid Function Tests: No results for input(s): "TSH", "T4TOTAL", "FREET4", "T3FREE", "THYROIDAB" in the last 72 hours. Anemia Panel: No results for input(s): "VITAMINB12", "FOLATE", "FERRITIN", "TIBC", "IRON", "RETICCTPCT" in the last 72 hours. Sepsis Labs: No results for input(s): "PROCALCITON", "LATICACIDVEN" in the last 168 hours.  No results found for this or any previous visit (from the past 240 hour(s)).       Radiology Studies: No results found.      Scheduled Meds:  apixaban  5 mg Oral BID   feeding supplement  237 mL Oral TID BM   ferrous sulfate  325 mg Oral QODAY   flecainide  50 mg Oral BID   multivitamin with minerals  1 tablet Oral Daily   pantoprazole  40 mg Oral Daily   Continuous Infusions:   LOS: 28 days    Time spent: 48 minutes spent on chart review, discussion with nursing staff, consultants, updating family and interview/physical exam; more than 50% of  that time was spent in counseling and/or coordination of care.    Jru Pense J [place] DO [facility] Available via Epic secure chat 7am-7pm After these hours, please refer to coverage provider listed on [email or web address] 09/23/2022, 12:19 PM

## 2022-09-23 NOTE — Progress Notes (Signed)
Nutrition Follow-up  DOCUMENTATION CODES:   Severe malnutrition in context of chronic illness  INTERVENTION:  Continue Ensure Enlive po TID, each supplement provides 350 kcal and 20 grams of protein. Continue meal feeding with assistance MVI with minerals daily Request updated weight  NUTRITION DIAGNOSIS:  Severe Malnutrition related to chronic illness as evidenced by severe muscle depletion, severe fat depletion. - remains ongoing  GOAL:  Patient will meet greater than or equal to 90% of their needs - goal unmet, addressing via feeding assistance, meals and supplements  MONITOR:  PO intake, Supplement acceptance, Weight trends, Labs  REASON FOR ASSESSMENT:  Consult Assessment of nutrition requirement/status  ASSESSMENT:  82 y.o. female presented to the ED with failure to thrive from SNF, recent decreased PO intake and lethargy. PMH includes Alzheimer's dementia, GERD, HTN, and A. Fib. Pt admitted with hypernatremia, AKI, dehydration, and hypokalemia.  No family present at bedside at time of visit. Pending SNF placement.   Obtained nutrition information from RN. She reports that NT was able to spend time feeding pt this morning. When she receives assistance she eats really well. She ate 100% of breakfast today. She was only able to consume a couple sips of Ensure as RN only able to assist when doing med passes which is when she provides additional sips of supplements. Per review of MAR, pt has received most nutrition supplements however it is unclear how much she has consumed since being ordered.  Meal completions: 1/17: 25% breakfast, 50% lunch, 100% dinner 1/19: 90% lunch, 95% dinner 1/20: 25% dinner 1/23: 100% breakfast  No updated documentation of weight on file since 08/30/22. Will request updated weight to review.   Medications: ferrous sulfate, MVI, protonix  Labs reviewed  Diet Order:   Diet Order             DIET - DYS 1 Room service appropriate? Yes; Fluid  consistency: Thin  Diet effective now                   EDUCATION NEEDS:  Not appropriate for education at this time  Skin:  Skin Assessment: Skin Integrity Issues: Skin Integrity Issues:: Stage II Stage II: Sacrum  Last BM:  1/21  Height:  Ht Readings from Last 1 Encounters:  08/30/22 5\' 2"  (1.575 m)    Weight:  Wt Readings from Last 1 Encounters:  08/30/22 50.2 kg    Ideal Body Weight:  50 kg  BMI:  Body mass index is 20.24 kg/m.  Estimated Nutritional Needs:   Kcal:  1500-1700  Protein:  75-90 grams  Fluid:  >/= 1.5 L  Clayborne Dana, RDN, LDN Clinical Nutrition

## 2022-09-24 DIAGNOSIS — E870 Hyperosmolality and hypernatremia: Secondary | ICD-10-CM | POA: Diagnosis not present

## 2022-09-24 DIAGNOSIS — N179 Acute kidney failure, unspecified: Secondary | ICD-10-CM | POA: Diagnosis not present

## 2022-09-24 DIAGNOSIS — F02C0 Dementia in other diseases classified elsewhere, severe, without behavioral disturbance, psychotic disturbance, mood disturbance, and anxiety: Secondary | ICD-10-CM

## 2022-09-24 DIAGNOSIS — R6251 Failure to thrive (child): Secondary | ICD-10-CM

## 2022-09-24 DIAGNOSIS — G309 Alzheimer's disease, unspecified: Secondary | ICD-10-CM

## 2022-09-24 DIAGNOSIS — K219 Gastro-esophageal reflux disease without esophagitis: Secondary | ICD-10-CM

## 2022-09-24 LAB — COMPREHENSIVE METABOLIC PANEL
ALT: 25 U/L (ref 0–44)
AST: 21 U/L (ref 15–41)
Albumin: 2 g/dL — ABNORMAL LOW (ref 3.5–5.0)
Alkaline Phosphatase: 68 U/L (ref 38–126)
Anion gap: 10 (ref 5–15)
BUN: 19 mg/dL (ref 8–23)
CO2: 26 mmol/L (ref 22–32)
Calcium: 8.3 mg/dL — ABNORMAL LOW (ref 8.9–10.3)
Chloride: 103 mmol/L (ref 98–111)
Creatinine, Ser: 0.64 mg/dL (ref 0.44–1.00)
GFR, Estimated: >60 mL/min (ref >60)
Glucose, Bld: 101 mg/dL — ABNORMAL HIGH (ref 70–99)
Potassium: 3.5 mmol/L (ref 3.5–5.1)
Sodium: 139 mmol/L (ref 135–145)
Total Bilirubin: 0.4 mg/dL (ref 0.3–1.2)
Total Protein: 6.3 g/dL — ABNORMAL LOW (ref 6.5–8.1)

## 2022-09-24 LAB — CBC WITH DIFFERENTIAL/PLATELET
Abs Immature Granulocytes: 0.03 10*3/uL (ref 0.00–0.07)
Basophils Absolute: 0 10*3/uL (ref 0.0–0.1)
Basophils Relative: 0 %
Eosinophils Absolute: 0.1 10*3/uL (ref 0.0–0.5)
Eosinophils Relative: 1 %
HCT: 26.5 % — ABNORMAL LOW (ref 36.0–46.0)
Hemoglobin: 8.1 g/dL — ABNORMAL LOW (ref 12.0–15.0)
Immature Granulocytes: 0 %
Lymphocytes Relative: 9 %
Lymphs Abs: 0.9 10*3/uL (ref 0.7–4.0)
MCH: 23.6 pg — ABNORMAL LOW (ref 26.0–34.0)
MCHC: 30.6 g/dL (ref 30.0–36.0)
MCV: 77.3 fL — ABNORMAL LOW (ref 80.0–100.0)
Monocytes Absolute: 0.6 10*3/uL (ref 0.1–1.0)
Monocytes Relative: 6 %
Neutro Abs: 8.2 10*3/uL — ABNORMAL HIGH (ref 1.7–7.7)
Neutrophils Relative %: 84 %
Platelets: 631 10*3/uL — ABNORMAL HIGH (ref 150–400)
RBC: 3.43 MIL/uL — ABNORMAL LOW (ref 3.87–5.11)
RDW: 17.3 % — ABNORMAL HIGH (ref 11.5–15.5)
WBC: 9.9 10*3/uL (ref 4.0–10.5)
nRBC: 0 % (ref 0.0–0.2)

## 2022-09-24 LAB — MAGNESIUM: Magnesium: 2 mg/dL (ref 1.7–2.4)

## 2022-09-24 LAB — PHOSPHORUS: Phosphorus: 3 mg/dL (ref 2.5–4.6)

## 2022-09-24 NOTE — Progress Notes (Signed)
PROGRESS NOTE    Donna Simmons  FTD:[id number] DOB: 1940-12-01 DOA: 08/25/2022 PCP: [name], DO   Brief Narrative:  Donna Simmons is a 82 y.o. female with past medical history significant for Alzheimer's dementia, HTN, HLD, permanent atrial fibrillation on EliquisWho [facility] from [facility] for failure to thrive.  Patient with decreased oral intake over the past 3 days with increased lethargy.  EMS was activated and patient was brought to the ED for further evaluation.  Workup revealed ESBL UTI and patient completed antibiotic course with IV ertapenem.  Currently medically stable and awaiting SNF placement.   Assessment and Plan: No notes have been filed under this hospital service. Service: Hospitalist  ESBL E. coli UTI -Completed 5-day course of ertapenem.   Hypernatremia, improved  -Presented with a sodium level of 167, likely in the setting of poor oral intake/dehydration from hypovolemia.   -Patient was started on IV fluid hydration with normalization of sodium level.   -Continue to encourage increased oral intake. -Unfortunately, hyponatremia confers a very poor prognosis in the setting of dementia and palliative care was consulted; however family not ready for hospice at this time.   -Na+ Trend: Recent Labs  Lab 09/08/22 0243 09/09/22 0302 09/10/22 0217 09/15/22 0308 09/17/22 0325 09/22/22 0239 09/24/22 1159  NA 136 140 140 138 138 140 139  -Continue intermittent BMPs   Thrombocytosis -Etiology in the setting of active infection as above.   -Had resolved but now worsened as Plt Count Trend: Recent Labs  Lab 08/30/22 0215 09/04/22 0256 09/08/22 0243 09/10/22 0217 09/15/22 0308 09/22/22 0239 09/24/22 1159  PLT 261 249 336 358 503* 643* 631*  -Continue to Monitor and Trend Intermittently and continue to Monitor for S/Sx of Bleeding -Stable from last check    Hypokalemia -K+ Trend Recent Labs  Lab 09/08/22 0243  09/09/22 0302 09/10/22 0217 09/15/22 0308 09/17/22 0325 09/22/22 0239 09/24/22 1159  K 3.5 3.6 3.6 3.9 3.5 3.8 3.5  -Continue to Monitor and Trend K+ Intermittently   Acute renal failure: Resolved -Presented with a creatinine 1.1, baseline 0.7. -Etiology likely secondary to severe dehydration, now resolved following IV fluid hydration.  -BUN/Cr Trend: Recent Labs  Lab 09/08/22 0243 09/09/22 0302 09/10/22 0217 09/15/22 0308 09/17/22 0325 09/22/22 0239 09/24/22 1159  BUN 5* 6* 11 15 10 20 19   CREATININE 0.51 0.57 0.57 0.56 0.59 0.53 0.64  -Avoid Nephrotoxic Medications, Contrast Dyes, Hypotension and Dehydration to Ensure Adequate Renal Perfusion and will need to Renally Adjust Meds -Continue to Monitor and Trend Renal Function carefully and repeat CMP intermittently   Transaminitis -Improved with IV fluid hydration. -LFT Trend: Recent Labs  Lab 08/26/22 0659 08/30/22 0215 09/04/22 0256 09/10/22 0217 09/15/22 0308 09/17/22 0325 09/24/22 1159  AST 53* 24 22 26  44* 11* 21  ALT 74* 31 21 22  35 21 25  -Continue to Monitor and Tend CMP intermittently   Hypoalbuminemia -In the setting of poor oral intake, underlying dementia.   -Continue to encourage increased oral intake. -Albumin Level Trend: Recent Labs  Lab 09/02/22 0246 09/02/22 1059 09/04/22 0256 09/10/22 0217 09/15/22 0308 09/17/22 0325 09/24/22 1159  ALBUMIN 2.2* 2.4* 2.2* 2.2* 2.4* 2.0* 2.0*  -Continue to Monitor and Trend CMP Intermittently    Iron Deficiency Anemia -Hgb/Hct Trend Recent Labs  Lab 08/30/22 0215 09/04/22 0256 09/08/22 0243 09/10/22 0217 09/15/22 0308 09/22/22 0239 09/24/22 1159  HGB 10.2* 9.4* 8.9* 8.7* 8.9* 8.8* 8.1*  HCT 32.2* 29.6* 27.5*  27.2* 28.3* 29.7* 26.5*  MCV 79.5* 78.9* 77.5* 77.3* 77.5* 79.6* 77.3*  -Continue to monitor for signs or symptoms of bleeding; no overt bleeding noted -Repeat CBC intermittently   Permanent Atrial Fibrillation -C/w Flecainide 50 mg  p.o. twice daily -C/w Apixaban 5 mg p.o. twice daily -Was Holding home metoprolol in the setting of soft blood pressures   Insomnia -C/w Melatonin 5 mg p.o. nightly as needed insomnia   Alzheimer's dementia -C/w Delirium precautions -Get up during the day -Encourage a familiar face to remain present throughout the day -Keep blinds open and lights on during daylight hours -Minimize the use of opioids/benzodiazepines -Evaluated by palliative care, family not ready for patient to enroll in hospice services yet.   Severe protein calorie malnutrition Estimated body mass index is 24.48 kg/m as calculated from the following:   Height as of this encounter: 5\' 2"  (1.575 m).   Weight as of this encounter: 60.7 kg. Nutrition Status: Nutrition Problem: Severe Malnutrition Etiology: chronic illness Signs/Symptoms: severe muscle depletion, severe fat depletion Interventions: Ensure Enlive (each supplement provides 350kcal and 20 grams of protein), MVI -Continue to encourage increased oral intake   Disposition -Per social work, patient cannot return to her previous SNF due to financial issues, they will also need to pay 30 days out from which spouse is unable to do so.   -No family to assist at home.   -Difficult placement, Medicaid application pending.   -Currently medically stable for discharge once safe discharge plan acquired.  DVT prophylaxis: SCDs Start: 08/25/22 0659 apixaban (ELIQUIS) tablet 5 mg    Code Status: DNR Family Communication: Discussed with Husband at bedside   Disposition Plan:  Level of care: Med-Surg Status is: Inpatient Remains inpatient appropriate because: Medically stable for discharge once safe disposition found by social work    Consultants:  Palliative Care  Procedures:  As delineated as above   Antimicrobials:  Anti-infectives (From admission, onward)    Start     Dose/Rate Route Frequency Ordered Stop   09/03/22 1000  ertapenem (INVANZ) 1,000 mg  in sodium chloride 0.9 % 100 mL IVPB        1 g 200 mL/hr over 30 Minutes Intravenous Every 24 hours 09/02/22 1421 09/04/22 2048   08/30/22 1500  ertapenem (INVANZ) 1,000 mg in sodium chloride 0.9 % 100 mL IVPB  Status:  Discontinued        1 g 200 mL/hr over 30 Minutes Intravenous Every 24 hours 08/30/22 1419 09/02/22 1421   08/29/22 1115  cefTRIAXone (ROCEPHIN) 1 g in sodium chloride 0.9 % 100 mL IVPB  Status:  Discontinued        1 g 200 mL/hr over 30 Minutes Intravenous Every 24 hours 08/29/22 1017 08/30/22 1419       Subjective: Seen and examined at bedside and she is resting and had no complaints.  Continues to remain pleasantly confused.  Remains medically stable for discharge.  Denies any other complaints or concerns at time.  Objective: Vitals:   09/23/22 0811 09/23/22 1921 09/24/22 0526 09/24/22 0731  BP: 104/68 100/65 99/64 (!) 102/52  Pulse: (!) 103 84 60 (!) 102  Resp: 16 16 17 16   Temp: 97.6 F (36.4 C) 98.2 F (36.8 C) 98.2 F (36.8 C) 99.3 F (37.4 C)  TempSrc: Axillary Oral Oral Oral  SpO2: 100% 100% 100% 100%  Weight:   60.7 kg   Height:        Intake/Output Summary (Last 24 hours) at 09/24/2022 [phone]  Last data filed at 09/24/2022 1610 Gross per 24 hour  Intake 740 ml  Output 200 ml  Net 540 ml   Filed Weights   08/30/22 1300 09/24/22 0526  Weight: 50.2 kg 60.7 kg   Examination: Physical Exam:  Constitutional: Thin cachectic chronically ill-appearing African American female in no acute distress and appears pleasantly confused Respiratory: Diminished to auscultation bilaterally, no wheezing, rales, rhonchi or crackles. Normal respiratory effort and patient is not tachypenic. No accessory muscle use. Unlabored breathing  Cardiovascular: RRR, no murmurs / rubs / gallops. S1 and S2 auscultated. No extremity edema.  Abdomen: Soft, non-tender, non-distended. Bowel sounds positive.  GU: Deferred. Musculoskeletal: No clubbing / cyanosis of digits/nails. No  joint deformity upper and lower extremities. Skin: No rashes, lesions, ulcers on a limited skin evaluation. No induration; Warm and dry.  Neurologic: CN 2-12 grossly intact with no focal deficits. Romberg sign and cerebellar reflexes not assessed.  Psychiatric: Impaired judgement and insight.   Data Reviewed: I have personally reviewed following labs and imaging studies  CBC: Recent Labs  Lab 09/22/22 0239  WBC 7.9  HGB 8.8*  HCT 29.7*  MCV 79.6*  PLT 643*   Basic Metabolic Panel: Recent Labs  Lab 09/22/22 0239  NA 140  K 3.8  CL 104  CO2 26  GLUCOSE 104*  BUN 20  CREATININE 0.53  CALCIUM 8.7*  MG 2.1   GFR: Estimated Creatinine Clearance: 48.9 mL/min (by C-G formula based on SCr of 0.53 mg/dL). Liver Function Tests: No results for input(s): "AST", "ALT", "ALKPHOS", "BILITOT", "PROT", "ALBUMIN" in the last 168 hours. No results for input(s): "LIPASE", "AMYLASE" in the last 168 hours. No results for input(s): "AMMONIA" in the last 168 hours. Coagulation Profile: No results for input(s): "INR", "PROTIME" in the last 168 hours. Cardiac Enzymes: No results for input(s): "CKTOTAL", "CKMB", "CKMBINDEX", "TROPONINI" in the last 168 hours. BNP (last 3 results) No results for input(s): "PROBNP" in the last 8760 hours. HbA1C: No results for input(s): "HGBA1C" in the last 72 hours. CBG: No results for input(s): "GLUCAP" in the last 168 hours. Lipid Profile: No results for input(s): "CHOL", "HDL", "LDLCALC", "TRIG", "CHOLHDL", "LDLDIRECT" in the last 72 hours. Thyroid Function Tests: No results for input(s): "TSH", "T4TOTAL", "FREET4", "T3FREE", "THYROIDAB" in the last 72 hours. Anemia Panel: No results for input(s): "VITAMINB12", "FOLATE", "FERRITIN", "TIBC", "IRON", "RETICCTPCT" in the last 72 hours. Sepsis Labs: No results for input(s): "PROCALCITON", "LATICACIDVEN" in the last 168 hours.  No results found for this or any previous visit (from the past 240 hour(s)).    Radiology Studies: No results found.  Scheduled Meds:  apixaban  5 mg Oral BID   feeding supplement  237 mL Oral TID BM   ferrous sulfate  325 mg Oral QODAY   flecainide  50 mg Oral BID   multivitamin with minerals  1 tablet Oral Daily   pantoprazole  40 mg Oral Daily   Continuous Infusions:   LOS: 29 days   Marguerita Merles, DO [facility] Available via Epic secure chat 7am-7pm After these hours, please refer to coverage provider listed on [email or web address] 09/24/2022, 9:05 AM

## 2022-09-24 NOTE — Discharge Instructions (Signed)

## 2022-09-25 DIAGNOSIS — R6251 Failure to thrive (child): Secondary | ICD-10-CM | POA: Diagnosis not present

## 2022-09-25 DIAGNOSIS — E870 Hyperosmolality and hypernatremia: Secondary | ICD-10-CM | POA: Diagnosis not present

## 2022-09-25 DIAGNOSIS — N179 Acute kidney failure, unspecified: Secondary | ICD-10-CM | POA: Diagnosis not present

## 2022-09-25 DIAGNOSIS — G309 Alzheimer's disease, unspecified: Secondary | ICD-10-CM | POA: Diagnosis not present

## 2022-09-25 MED ORDER — MEDIHONEY WOUND/BURN DRESSING EX PSTE
1.0000 | PASTE | Freq: Every day | CUTANEOUS | Status: AC
  Administered 2022-09-25 – 2022-11-03 (×39): 1 via TOPICAL
  Filled 2022-09-25 (×7): qty 44

## 2022-09-25 NOTE — Consult Note (Signed)
WOC Nurse Consult Note:  Reason for Consult: sacral wound  Wound type: Pressure  Sacrum Unstageable Pressure Injury 2 cms x 1 cms 100% devitalized tissue  R lateral knee Unstageable Pressure Injury 0.5 cms x 0.5 cms 100% devitalized tissue  R medial knee Stage 2 Pressure Injury 1 cm x 1 cm x 0.1 cm 100% pink moist  R great toe full thickness 3 cms x 3 cms x 0.1 cm 50% tan devitalized tissue 50% red moist  L lateral foot Deep Tissure Pressure Injury 2 cms x 1 cm intact blister  R 5th digit dark tissue surrounding; suspect Deep Tissue Injury that has evolved to full thickness 1.5 cms x 2 cms x 0.1 cms pink moist area  R upper posterior thigh full thickness linear wound with a dry scab, 4 cms x 0.3 cms  Pressure Injury POA: Sacral Pressure Injury not present on admission  Dressing procedure/placement/frequency:  Sacrum:  clean wound with NS, apply Medihoney to gauze and place on wound bed daily, cover with foam dressing.  R lateral knee clean with NS, apply Medihoney to gauze and place on wound bed daily, cover with foam dressing.  R medial knee Stage 2 cover with foam dressing.  R great toe full thickness clean with NS, apply Medihoney to gauze and place in wound bed daily, cover with foam dressing.  L lateral foot cover with foam dressing.  R 5th digit cover with foam dressing.  R upper posterior thigh cover with foam dressing.   All foam dressings to be changed q 3 days and prn soiling.  POC discussed with bedside nurse and patient.  Patient to be placed on low air loss mattress for moisture management and pressure redistribution.  Prevalon boots ordered for off loading of heels.    WOC will reassess this patient weekly to determine changes in plan of care.   Thank you,     Overton Boggus MSN, RN-BC, [employer]

## 2022-09-25 NOTE — TOC Progression Note (Signed)
Transition of Care [facility]) - Progression Note    Patient Details  Name: Donna Simmons MRN: [id number] Date of Birth: [birth year]  Transition of Care [facility]) CM/SW [place], [place] Phone Number: 09/25/2022, 2:33 PM  Clinical Narrative:     CSW followed up with pt's spouse to get an update on medicaid application status. Spouse states it is currently being reviewed, but has not gotten a caseworker yet. CSW followed up with several facilities to start reviewing her for LTC placement. Requesting facilities to determine if they can take an LOG. [facility] currently only has LTC beds at [facility] [place]. Will attempt to get a placement closer to home. TOC will continue to follow for DC needs.        Expected Discharge Plan and Services                                               Social Determinants of Health (SDOH) Interventions SDOH Screenings   Food Insecurity: Unknown (07/03/2022)  Alcohol Screen: Low Risk  (04/12/2019)  Depression (PHQ2-9): Low Risk  (11/05/2020)  Tobacco Use: Low Risk  (08/20/2022)    Readmission Risk Interventions     No data to display

## 2022-09-25 NOTE — Progress Notes (Signed)
PROGRESS NOTE    Donna Simmons  EGB:[id number] DOB: September 17, 1940 DOA: 08/25/2022 PCP: [name], DO   Brief Narrative:  Donna Simmons is a 82 y.o. female with past medical history significant for Alzheimer's dementia, HTN, HLD, permanent atrial fibrillation on Eliquis who presented to [facility] ED on 12/25 from [facility] for failure to thrive.  Patient with decreased oral intake over the past 3 days with increased lethargy.  EMS was activated and patient was brought to the ED for further evaluation.  Workup revealed ESBL UTI and patient completed antibiotic course with IV ertapenem. She improved and is currently medically stable and awaiting SNF placement she is a difficult placement.  Case worker is involved and following up on Medicaid application status and per TOC is currently being reviewed but the patient has not gotten the caseworker.  Caseworker has followed up with several facilities to start reviewing her for LTC placement and requesting the facility to determine if they can take an LOG.   Assessment and Plan:  ESBL E. coli UTI -Completed 5-day course of ertapenem.   Hypernatremia, improved  -Presented with a sodium level of 167, likely in the setting of poor oral intake/dehydration from hypovolemia.   -Patient was started on IV fluid hydration with normalization of sodium level.   -Continue to encourage increased oral intake.  -Unfortunately, hyponatremia confers a very poor prognosis in the setting of dementia and palliative care was consulted; however family not ready for hospice at this time.   -Na+ Trend: Recent Labs  Lab 09/08/22 0243 09/09/22 0302 09/10/22 0217 09/15/22 0308 09/17/22 0325 09/22/22 0239 09/24/22 1159  NA 136 140 140 138 138 140 139  -Continue intermittent BMPs   Thrombocytosis -Etiology in the setting of active infection as above.   -Had resolved but now worsened as Plt Count Trend: Recent Labs  Lab 08/30/22 0215 09/04/22 0256  09/08/22 0243 09/10/22 0217 09/15/22 0308 09/22/22 0239 09/24/22 1159  PLT 261 249 336 358 503* 643* 631*  -Continue to Monitor and Trend Intermittently and continue to Monitor for S/Sx of Bleeding -Stable from last check    Hypokalemia -K+ Trend Recent Labs  Lab 09/08/22 0243 09/09/22 0302 09/10/22 0217 09/15/22 0308 09/17/22 0325 09/22/22 0239 09/24/22 1159  K 3.5 3.6 3.6 3.9 3.5 3.8 3.5  -Continue to Monitor and Trend K+ Intermittently   Acute renal failure: Resolved -Presented with a creatinine 1.1, baseline 0.7. -Etiology likely secondary to severe dehydration, now resolved following IV fluid hydration.  -BUN/Cr Trend: Recent Labs  Lab 09/08/22 0243 09/09/22 0302 09/10/22 0217 09/15/22 0308 09/17/22 0325 09/22/22 0239 09/24/22 1159  BUN 5* 6* 11 15 10 20 19   CREATININE 0.51 0.57 0.57 0.56 0.59 0.53 0.64  -Avoid Nephrotoxic Medications, Contrast Dyes, Hypotension and Dehydration to Ensure Adequate Renal Perfusion and will need to Renally Adjust Meds -Continue to Monitor and Trend Renal Function carefully and repeat CMP intermittently   Transaminitis -Improved with IV fluid hydration. -LFT Trend: Recent Labs  Lab 08/30/22 0215 09/04/22 0256 09/10/22 0217 09/15/22 0308 09/17/22 0325 09/24/22 1159  AST 24 22 26  44* 11* 21  ALT 31 21 22  35 21 25  -Continue to Monitor and Tend CMP intermittently   Hypoalbuminemia -In the setting of poor oral intake, underlying dementia.   -Continue to encourage increased oral intake. -Albumin Level Trend: Recent Labs  Lab 09/02/22 0246 09/02/22 1059 09/04/22 0256 09/10/22 0217 09/15/22 0308 09/17/22 0325 09/24/22 1159  ALBUMIN 2.2* 2.4* 2.2* 2.2* 2.4* 2.0*  2.0*  -Continue to Monitor and Trend CMP Intermittently     Iron Deficiency Anemia -Hgb/Hct Trend Recent Labs  Lab 08/30/22 0215 09/04/22 0256 09/08/22 0243 09/10/22 0217 09/15/22 0308 09/22/22 0239 09/24/22 1159  HGB 10.2* 9.4* 8.9* 8.7* 8.9* 8.8*  8.1*  HCT 32.2* 29.6* 27.5* 27.2* 28.3* 29.7* 26.5*  MCV 79.5* 78.9* 77.5* 77.3* 77.5* 79.6* 77.3*  -Continue to monitor for signs or symptoms of bleeding; no overt bleeding noted -Repeat CBC intermittently   Permanent Atrial Fibrillation -C/w Flecainide 50 mg p.o. twice daily -C/w Apixaban 5 mg p.o. twice daily -Was Holding home metoprolol in the setting of soft blood pressures   Insomnia -C/w Melatonin 5 mg p.o. nightly as needed insomnia   Alzheimer's Dementia -C/w Delirium precautions -Get up during the day -Encourage a familiar face to remain present throughout the day -Keep blinds open and lights on during daylight hours -Minimize the use of opioids/benzodiazepines -Evaluated by palliative care, family not ready for patient to enroll in hospice services yet.  Pressure Ulcers, not present on Admission Pressure Injury 09/04/22 Sacrum Unstageable - Full thickness tissue loss in which the base of the injury is covered by slough (yellow, tan, gray, green or brown) and/or eschar (tan, brown or black) in the wound bed. (Active)  09/04/22 1030  Location: Sacrum  Location Orientation:   Staging: Unstageable - Full thickness tissue loss in which the base of the injury is covered by slough (yellow, tan, gray, green or brown) and/or eschar (tan, brown or black) in the wound bed.  Wound Description (Comments):   Present on Admission: No  Per Wound Nurse:  WOC Nurse Consult Note:  Reason for Consult: sacral wound  Wound type: Pressure  Sacrum Unstageable Pressure Injury 2 cms x 1 cms 100% devitalized tissue  R lateral knee Unstageable Pressure Injury 0.5 cms x 0.5 cms 100% devitalized tissue  R medial knee Stage 2 Pressure Injury 1 cm x 1 cm x 0.1 cm 100% pink moist  R great toe full thickness 3 cms x 3 cms x 0.1 cm 50% tan devitalized tissue 50% red moist  L lateral foot Deep Tissure Pressure Injury 2 cms x 1 cm intact blister  R 5th digit dark tissue surrounding; suspect Deep Tissue  Injury that has evolved to full thickness 1.5 cms x 2 cms x 0.1 cms pink moist area  R upper posterior thigh full thickness linear wound with a dry scab, 4 cms x 0.3 cms  Pressure Injury POA: Sacral Pressure Injury not present on admission  Dressing procedure/placement/frequency:  Sacrum:  clean wound with NS, apply Medihoney to gauze and place on wound bed daily, cover with foam dressing.  R lateral knee clean with NS, apply Medihoney to gauze and place on wound bed daily, cover with foam dressing.  R medial knee Stage 2 cover with foam dressing.  R great toe full thickness clean with NS, apply Medihoney to gauze and place in wound bed daily, cover with foam dressing.  L lateral foot cover with foam dressing.  R 5th digit cover with foam dressing.  R upper posterior thigh cover with foam dressing.   All foam dressings to be changed q 3 days and prn soiling.  POC discussed with bedside nurse and patient.  Patient to be placed on low air loss mattress for moisture management and pressure redistribution.  Prevalon boots ordered for off loading of heels.        Severe protein calorie malnutrition Estimated body mass index is  24.48 kg/m as calculated from the following:   Height as of this encounter: 5\' 2"  (1.575 m).   Weight as of this encounter: 60.7 kg. Nutrition Status: Nutrition Problem: Severe Malnutrition Etiology: chronic illness Signs/Symptoms: severe muscle depletion, severe fat depletion Interventions: Ensure Enlive (each supplement provides 350kcal and 20 grams of protein), MVI -Continue to encourage increased oral intake   Disposition -Per social work, patient cannot return to her previous SNF due to financial issues, they will also need to pay 30 days out from which spouse is unable to do so.   -No family to assist at home.   -Difficult placement, Medicaid application pending and patient's spouse states that the Medicaid application is currently being reviewed but has not  gotten the case worker yet.   -Currently medically stable for discharge once safe discharge plan acquired.  DVT prophylaxis: SCDs Start: 08/25/22 0659 apixaban (ELIQUIS) tablet 5 mg    Code Status: DNR Family Communication: No family currently at bedside  Disposition Plan:  Level of care: Med-Surg Status is: Inpatient Remains inpatient appropriate because: Is medically stable for discharge once safe disposition is found by social work   Consultants:  Palliative Care Medicine   Procedures:  As delineated as above  Antimicrobials:  Anti-infectives (From admission, onward)    Start     Dose/Rate Route Frequency Ordered Stop   09/03/22 1000  ertapenem (INVANZ) 1,000 mg in sodium chloride 0.9 % 100 mL IVPB        1 g 200 mL/hr over 30 Minutes Intravenous Every 24 hours 09/02/22 1421 09/04/22 2048   08/30/22 1500  ertapenem (INVANZ) 1,000 mg in sodium chloride 0.9 % 100 mL IVPB  Status:  Discontinued        1 g 200 mL/hr over 30 Minutes Intravenous Every 24 hours 08/30/22 1419 09/02/22 1421   08/29/22 1115  cefTRIAXone (ROCEPHIN) 1 g in sodium chloride 0.9 % 100 mL IVPB  Status:  Discontinued        1 g 200 mL/hr over 30 Minutes Intravenous Every 24 hours 08/29/22 1017 08/30/22 1419       Subjective: Seen and examined at bedside and she is resting comfortably without issues.  She had no complaints and continues to remain pleasantly confused.  Continues to remain medically stable for discharge.  Objective: Vitals:   09/24/22 1515 09/24/22 2100 09/25/22 0512 09/25/22 0824  BP: 98/61 111/71 104/66 109/64  Pulse: 98 (!) 109 94 92  Resp: 16  16 16   Temp: 99.1 F (37.3 C) 99.2 F (37.3 C) 99 F (37.2 C) (!) 97.5 F (36.4 C)  TempSrc: Oral Axillary Axillary Axillary  SpO2: 100% 98% 97% 100%  Weight:      Height:        Intake/Output Summary (Last 24 hours) at 09/25/2022 1550 Last data filed at 09/25/2022 0909 Gross per 24 hour  Intake 580 ml  Output --  Net 580 ml    Filed Weights   08/30/22 1300 09/24/22 0526  Weight: 50.2 kg 60.7 kg   Examination: Physical Exam:  Constitutional: Thin cachectic chronically ill-appearing African-American female currently no acute distress appears pleasantly confused and wanting to rest Respiratory: Diminished to auscultation bilaterally, no wheezing, rales, rhonchi or crackles. Normal respiratory effort and patient is not tachypenic. No accessory muscle use.  Unlabored breathing Cardiovascular: RRR, no murmurs / rubs / gallops.  No appreciable extremity edema. 2+ pedal pulses. No carotid bruits.  Abdomen: Soft, non-tender, non-distended.  Bowel sounds positive.  GU: Deferred.  Musculoskeletal: No clubbing / cyanosis of digits/nails. No joint deformity upper and lower extremities. Skin: Has a sacral ulcer as well as other lower extremity ulcers on the right and left legs but no appreciable rashes on limited skin evaluation Neurologic: CN 2-12 grossly intact with no focal deficits.  Romberg sign cerebellar reflexes not assessed.  Psychiatric: Impaired judgment and insight  Data Reviewed: I have personally reviewed following labs and imaging studies  CBC: Recent Labs  Lab 09/22/22 0239 09/24/22 1159  WBC 7.9 9.9  NEUTROABS  --  8.2*  HGB 8.8* 8.1*  HCT 29.7* 26.5*  MCV 79.6* 77.3*  PLT 643* 631*   Basic Metabolic Panel: Recent Labs  Lab 09/22/22 0239 09/24/22 1159  NA 140 139  K 3.8 3.5  CL 104 103  CO2 26 26  GLUCOSE 104* 101*  BUN 20 19  CREATININE 0.53 0.64  CALCIUM 8.7* 8.3*  MG 2.1 2.0  PHOS  --  3.0   GFR: Estimated Creatinine Clearance: 48.9 mL/min (by C-G formula based on SCr of 0.64 mg/dL). Liver Function Tests: Recent Labs  Lab 09/24/22 1159  AST 21  ALT 25  ALKPHOS 68  BILITOT 0.4  PROT 6.3*  ALBUMIN 2.0*   No results for input(s): "LIPASE", "AMYLASE" in the last 168 hours. No results for input(s): "AMMONIA" in the last 168 hours. Coagulation Profile: No results for  input(s): "INR", "PROTIME" in the last 168 hours. Cardiac Enzymes: No results for input(s): "CKTOTAL", "CKMB", "CKMBINDEX", "TROPONINI" in the last 168 hours. BNP (last 3 results) No results for input(s): "PROBNP" in the last 8760 hours. HbA1C: No results for input(s): "HGBA1C" in the last 72 hours. CBG: No results for input(s): "GLUCAP" in the last 168 hours. Lipid Profile: No results for input(s): "CHOL", "HDL", "LDLCALC", "TRIG", "CHOLHDL", "LDLDIRECT" in the last 72 hours. Thyroid Function Tests: No results for input(s): "TSH", "T4TOTAL", "FREET4", "T3FREE", "THYROIDAB" in the last 72 hours. Anemia Panel: No results for input(s): "VITAMINB12", "FOLATE", "FERRITIN", "TIBC", "IRON", "RETICCTPCT" in the last 72 hours. Sepsis Labs: No results for input(s): "PROCALCITON", "LATICACIDVEN" in the last 168 hours.  No results found for this or any previous visit (from the past 240 hour(s)).   Radiology Studies: No results found.  Scheduled Meds:  apixaban  5 mg Oral BID   feeding supplement  237 mL Oral TID BM   ferrous sulfate  325 mg Oral QODAY   flecainide  50 mg Oral BID   leptospermum manuka honey  1 Application Topical Daily   multivitamin with minerals  1 tablet Oral Daily   pantoprazole  40 mg Oral Daily   Continuous Infusions:   LOS: 30 days   Marguerita Merles, DO [facility] Available via Epic secure chat 7am-7pm After these hours, please refer to coverage provider listed on [email or web address] 09/25/2022, 3:50 PM

## 2022-09-26 DIAGNOSIS — R6251 Failure to thrive (child): Secondary | ICD-10-CM | POA: Diagnosis not present

## 2022-09-26 DIAGNOSIS — G309 Alzheimer's disease, unspecified: Secondary | ICD-10-CM | POA: Diagnosis not present

## 2022-09-26 DIAGNOSIS — E870 Hyperosmolality and hypernatremia: Secondary | ICD-10-CM | POA: Diagnosis not present

## 2022-09-26 DIAGNOSIS — N179 Acute kidney failure, unspecified: Secondary | ICD-10-CM | POA: Diagnosis not present

## 2022-09-26 MED ORDER — POTASSIUM CHLORIDE CRYS ER 20 MEQ PO TBCR
40.0000 meq | EXTENDED_RELEASE_TABLET | Freq: Once | ORAL | Status: AC
  Administered 2022-09-26: 40 meq via ORAL
  Filled 2022-09-26: qty 2

## 2022-09-26 NOTE — Progress Notes (Signed)
PROGRESS NOTE    Donna Simmons  QVZ:[id number] DOB: Jun 16, 1941 DOA: 08/25/2022 PCP: [name], DO   Brief Narrative:  Donna Simmons is a 82 y.o. female with past medical history significant for Alzheimer's dementia, HTN, HLD, permanent atrial fibrillation on Eliquis who presented to [facility] ED on 12/25 from [facility] for failure to thrive.  Patient with decreased oral intake over the past 3 days with increased lethargy.  EMS was activated and patient was brought to the ED for further evaluation.  Workup revealed ESBL UTI and patient completed antibiotic course with IV ertapenem. She improved and is currently medically stable and awaiting SNF placement she is a difficult placement.    TOC is involved and following up on Medicaid application status and per TOC is currently being reviewed by DSS but the patient has not gotten the caseworker assigned yet. Caseworker following up with several facilities to start reviewing her for LTC placement and requesting the facility to determine if they can take an LOG.    Assessment and Plan:  ESBL E. coli UTI -Completed 5-day course of ertapenem.   Hypernatremia, improved  -Presented with a sodium level of 167, likely in the setting of poor oral intake/dehydration from hypovolemia.   -Patient was started on IV fluid hydration with normalization of sodium level.   -Continue to encourage increased oral intake.  -Unfortunately, hyponatremia confers a very poor prognosis in the setting of dementia and palliative care was consulted; however family not ready for hospice at this time.   -Na+ Trend: Recent Labs  Lab 09/08/22 0243 09/09/22 0302 09/10/22 0217 09/15/22 0308 09/17/22 0325 09/22/22 0239 09/24/22 1159  NA 136 140 140 138 138 140 139  -Continue intermittent BMPs and will repeat CMP in the morning   Thrombocytosis -Etiology in the setting of active infection as above.   -Had resolved but now worsened as Plt Count Trend: Recent Labs   Lab 08/30/22 0215 09/04/22 0256 09/08/22 0243 09/10/22 0217 09/15/22 0308 09/22/22 0239 09/24/22 1159  PLT 261 249 336 358 503* 643* 631*  -Continue to Monitor and Trend Intermittently and continue to Monitor for S/Sx of Bleeding -Stable from last check    Hypokalemia -K+ Trend Recent Labs  Lab 09/08/22 0243 09/09/22 0302 09/10/22 0217 09/15/22 0308 09/17/22 0325 09/22/22 0239 09/24/22 1159  K 3.5 3.6 3.6 3.9 3.5 3.8 3.5  -Continue to Monitor and Trend K+ Intermittently   Acute renal failure: Resolved -Presented with a creatinine 1.1, baseline 0.7. -Etiology likely secondary to severe dehydration, now resolved following IV fluid hydration.  -BUN/Cr Trend: Recent Labs  Lab 09/08/22 0243 09/09/22 0302 09/10/22 0217 09/15/22 0308 09/17/22 0325 09/22/22 0239 09/24/22 1159  BUN 5* 6* 11 15 10 20 19   CREATININE 0.51 0.57 0.57 0.56 0.59 0.53 0.64  -Avoid Nephrotoxic Medications, Contrast Dyes, Hypotension and Dehydration to Ensure Adequate Renal Perfusion and will need to Renally Adjust Meds -Continue to Monitor and Trend Renal Function carefully and repeat CMP intermittently and will repeat in the morning   Transaminitis -Improved with IV fluid hydration. -LFT Trend: Recent Labs  Lab 08/30/22 0215 09/04/22 0256 09/10/22 0217 09/15/22 0308 09/17/22 0325 09/24/22 1159  AST 24 22 26  44* 11* 21  ALT 31 21 22  35 21 25  -Continue to Monitor and Tend CMP intermittently and will repeat in the morning   Hypoalbuminemia -In the setting of poor oral intake, underlying dementia.   -Continue to encourage increased oral intake. -Albumin Level Trend: Recent Labs  Lab 09/02/22 0246 09/02/22 1059 09/04/22 0256 09/10/22 0217 09/15/22 0308 09/17/22 0325 09/24/22 1159  ALBUMIN 2.2* 2.4* 2.2* 2.2* 2.4* 2.0* 2.0*  -Continue to Monitor and Trend CMP Intermittently and will repeat in the morning    Iron Deficiency Anemia -Hgb/Hct Trend Recent Labs  Lab 08/30/22 0215  09/04/22 0256 09/08/22 0243 09/10/22 0217 09/15/22 0308 09/22/22 0239 09/24/22 1159  HGB 10.2* 9.4* 8.9* 8.7* 8.9* 8.8* 8.1*  HCT 32.2* 29.6* 27.5* 27.2* 28.3* 29.7* 26.5*  MCV 79.5* 78.9* 77.5* 77.3* 77.5* 79.6* 77.3*  -Continue to monitor for signs or symptoms of bleeding; no overt bleeding noted -Repeat CBC intermittently and will repeat in the morning   Permanent Atrial Fibrillation -C/w Flecainide 50 mg p.o. twice daily -C/w Apixaban 5 mg p.o. twice daily -Was Holding home metoprolol in the setting of soft blood pressures   Insomnia -C/w Melatonin 5 mg p.o. nightly as needed insomnia   Alzheimer's Dementia -C/w Delirium precautions -Get up during the day -Encourage a familiar face to remain present throughout the day -Keep blinds open and lights on during daylight hours -Minimize the use of opioids/benzodiazepines -Evaluated by palliative care, family not ready for patient to enroll in hospice services yet.   Pressure Ulcers, not present on Admission Pressure Injury 09/04/22 Sacrum Unstageable - Full thickness tissue loss in which the base of the injury is covered by slough (yellow, tan, gray, green or brown) and/or eschar (tan, brown or black) in the wound bed. (Active)  09/04/22 1030  Location: Sacrum  Location Orientation:   Staging: Unstageable - Full thickness tissue loss in which the base of the injury is covered by slough (yellow, tan, gray, green or brown) and/or eschar (tan, brown or black) in the wound bed.  Wound Description (Comments):   Present on Admission: No  Per Wound Nurse:  WOC Nurse Consult Note:  Reason for Consult: sacral wound  Wound type: Pressure  Sacrum Unstageable Pressure Injury 2 cms x 1 cms 100% devitalized tissue  R lateral knee Unstageable Pressure Injury 0.5 cms x 0.5 cms 100% devitalized tissue  R medial knee Stage 2 Pressure Injury 1 cm x 1 cm x 0.1 cm 100% pink moist  R great toe full thickness 3 cms x 3 cms x 0.1 cm 50% tan  devitalized tissue 50% red moist  L lateral foot Deep Tissure Pressure Injury 2 cms x 1 cm intact blister  R 5th digit dark tissue surrounding; suspect Deep Tissue Injury that has evolved to full thickness 1.5 cms x 2 cms x 0.1 cms pink moist area  R upper posterior thigh full thickness linear wound with a dry scab, 4 cms x 0.3 cms  Pressure Injury POA: Sacral Pressure Injury not present on admission  Dressing procedure/placement/frequency:  Sacrum:  clean wound with NS, apply Medihoney to gauze and place on wound bed daily, cover with foam dressing.  R lateral knee clean with NS, apply Medihoney to gauze and place on wound bed daily, cover with foam dressing.  R medial knee Stage 2 cover with foam dressing.  R great toe full thickness clean with NS, apply Medihoney to gauze and place in wound bed daily, cover with foam dressing.  L lateral foot cover with foam dressing.  R 5th digit cover with foam dressing.  R upper posterior thigh cover with foam dressing.   All foam dressings to be changed q 3 days and prn soiling.  POC discussed with bedside nurse and patient.  Patient to be placed on  low air loss mattress for moisture management and pressure redistribution.  Prevalon boots ordered for off loading of heels.         Severe protein calorie malnutrition -Estimated body mass index is 24.48 kg/m as calculated from the following:   Height as of this encounter: 5\' 2"  (1.575 m).   Weight as of this encounter: 60.7 kg. -Nutrition Status: Nutrition Problem: Severe Malnutrition Etiology: chronic illness Signs/Symptoms: severe muscle depletion, severe fat depletion Interventions: Ensure Enlive (each supplement provides 350kcal and 20 grams of protein), MVI -Continue to encourage increased oral intake   Disposition -Per social work, patient cannot return to her previous SNF due to financial issues, they will also need to pay 30 days out from which spouse is unable to do so.   -No family to  assist at home.   -Difficult placement, Medicaid application pending and patient's spouse states that the Medicaid application is currently being reviewed but has not gotten the case worker yet.   -Currently medically stable for discharge once safe discharge plan acquired.  DVT prophylaxis: SCDs Start: 08/25/22 0659 apixaban (ELIQUIS) tablet 5 mg    Code Status: DNR Family Communication: No family currently at bedside  Disposition Plan:  Level of care: Med-Surg Status is: Inpatient Remains inpatient appropriate because: Medically stable for discharge once safe disposition found by social work     Consultants:  Palliative Care  Procedures:  As delay needed as above  Antimicrobials:  Anti-infectives (From admission, onward)    Start     Dose/Rate Route Frequency Ordered Stop   09/03/22 1000  ertapenem (INVANZ) 1,000 mg in sodium chloride 0.9 % 100 mL IVPB        1 g 200 mL/hr over 30 Minutes Intravenous Every 24 hours 09/02/22 1421 09/04/22 2048   08/30/22 1500  ertapenem (INVANZ) 1,000 mg in sodium chloride 0.9 % 100 mL IVPB  Status:  Discontinued        1 g 200 mL/hr over 30 Minutes Intravenous Every 24 hours 08/30/22 1419 09/02/22 1421   08/29/22 1115  cefTRIAXone (ROCEPHIN) 1 g in sodium chloride 0.9 % 100 mL IVPB  Status:  Discontinued        1 g 200 mL/hr over 30 Minutes Intravenous Every 24 hours 08/29/22 1017 08/30/22 1419       Subjective: Seen and examined at bedside and the patient was doing okay and denied any pain but she remains pleasantly confused.  Stable for discharge and denies any concerns or complaints at this time but she was she still having a difficult time with her disposition.  Objective: Vitals:   09/25/22 2041 09/25/22 2204 09/26/22 0552 09/26/22 0818  BP: 101/61  100/61 103/62  Pulse: 81  80 100  Resp: 17  18 16   Temp:  98.2 F (36.8 C) 98.7 F (37.1 C) 98.2 F (36.8 C)  TempSrc:  Oral Oral Oral  SpO2: 94%  94% 100%  Weight:      Height:         Intake/Output Summary (Last 24 hours) at 09/26/2022 [id number] Last data filed at 09/26/2022 0602 Gross per 24 hour  Intake 480 ml  Output 100 ml  Net 380 ml   Filed Weights   08/30/22 1300 09/24/22 0526  Weight: 50.2 kg 60.7 kg   Examination: Physical Exam:  Constitutional: Thin cachectic chronically ill-appearing African-American female currently no acute distress appears calm pleasantly confused Respiratory: Diminished to auscultation bilaterally, no wheezing, rales, rhonchi or crackles. Normal respiratory effort and  patient is not tachypenic. No accessory muscle use.  Unlabored breathing Cardiovascular: RRR, no murmurs / rubs / gallops. S1 and S2 auscultated. No extremity edema.  Abdomen: Soft, non-tender, non-distended.  Bowel sounds positive.  GU: Deferred. Musculoskeletal: No clubbing / cyanosis of digits/nails. No joint deformity upper and lower extremities.  Skin: Ulcers on the lower extremities noted.  No appreciable rashes or lesions on limited skin evaluation Neurologic: CN 2-12 grossly intact with no focal deficits. Romberg sign cerebellar reflexes not assessed.  Psychiatric: Impaired judgment and insight  Data Reviewed: I have personally reviewed following labs and imaging studies  CBC: Recent Labs  Lab 09/22/22 0239 09/24/22 1159  WBC 7.9 9.9  NEUTROABS  --  8.2*  HGB 8.8* 8.1*  HCT 29.7* 26.5*  MCV 79.6* 77.3*  PLT 643* 329*   Basic Metabolic Panel: Recent Labs  Lab 09/22/22 0239 09/24/22 1159  NA 140 139  K 3.8 3.5  CL 104 103  CO2 26 26  GLUCOSE 104* 101*  BUN 20 19  CREATININE 0.53 0.64  CALCIUM 8.7* 8.3*  MG 2.1 2.0  PHOS  --  3.0   GFR: Estimated Creatinine Clearance: 48.9 mL/min (by C-G formula based on SCr of 0.64 mg/dL). Liver Function Tests: Recent Labs  Lab 09/24/22 1159  AST 21  ALT 25  ALKPHOS 68  BILITOT 0.4  PROT 6.3*  ALBUMIN 2.0*   No results for input(s): "LIPASE", "AMYLASE" in the last 168 hours. No results for  input(s): "AMMONIA" in the last 168 hours. Coagulation Profile: No results for input(s): "INR", "PROTIME" in the last 168 hours. Cardiac Enzymes: No results for input(s): "CKTOTAL", "CKMB", "CKMBINDEX", "TROPONINI" in the last 168 hours. BNP (last 3 results) No results for input(s): "PROBNP" in the last 8760 hours. HbA1C: No results for input(s): "HGBA1C" in the last 72 hours. CBG: No results for input(s): "GLUCAP" in the last 168 hours. Lipid Profile: No results for input(s): "CHOL", "HDL", "LDLCALC", "TRIG", "CHOLHDL", "LDLDIRECT" in the last 72 hours. Thyroid Function Tests: No results for input(s): "TSH", "T4TOTAL", "FREET4", "T3FREE", "THYROIDAB" in the last 72 hours. Anemia Panel: No results for input(s): "VITAMINB12", "FOLATE", "FERRITIN", "TIBC", "IRON", "RETICCTPCT" in the last 72 hours. Sepsis Labs: No results for input(s): "PROCALCITON", "LATICACIDVEN" in the last 168 hours.  No results found for this or any previous visit (from the past 240 hour(s)).   Radiology Studies: No results found.  Scheduled Meds:  apixaban  5 mg Oral BID   feeding supplement  237 mL Oral TID BM   ferrous sulfate  325 mg Oral QODAY   flecainide  50 mg Oral BID   leptospermum manuka honey  1 Application Topical Daily   multivitamin with minerals  1 tablet Oral Daily   pantoprazole  40 mg Oral Daily   Continuous Infusions:   LOS: 31 days   Raiford Noble, DO [facility] Available via Epic secure chat 7am-7pm After these hours, please refer to coverage provider listed on [email or web address] 09/26/2022, 9:11 AM

## 2022-09-26 NOTE — TOC Progression Note (Signed)
Transition of Care [facility]) - Progression Note    Patient Details  Name: Donna Simmons MRN: [id number] Date of Birth: 14-Nov-1940  Transition of Care [facility]) CM/SW [place], [place] Phone Number: 09/26/2022, 1:03 PM  Clinical Narrative:    Medicaid application still being reviewed by DSS, no caseworker assigned yet. Search began for a facility to accept Medicaid/ LOG.   [facility] facilities- only at [place]. [facility]- no  Central Intake reviewing for [place], [place], [place], [place], and additional facilities. [place]- no [facility] reviewing [place]- No [place]- No [place] [facility]- No [facility]/ [facility]- No [facility]/ [facility] facilities- no [place]- No [place]- No [place]- No response [place]- No response [place]- No [place]- No [place]- No [place]- No [place]- No [place]- No [facility]- No [place]- No  TOC will continue to follow for placement options.        Expected Discharge Plan and Services                                               Social Determinants of Health (SDOH) Interventions SDOH Screenings   Food Insecurity: Unknown (07/03/2022)  Alcohol Screen: Low Risk  (04/12/2019)  Depression (PHQ2-9): Low Risk  (11/05/2020)  Tobacco Use: Low Risk  (08/20/2022)    Readmission Risk Interventions     No data to display

## 2022-09-26 NOTE — Progress Notes (Signed)
      Chart reviewed. Discussed with Dr. Alfredia Ferguson. Goals are clear to discharge back to SNF when able to be placed. Recommend hospice or outpatient palliative at discharge.   Palliative Medicine will sign off at this time. Please re-consult if needed.    Elie Confer, NP-C Palliative Medicine   Please call Palliative Medicine team phone with any questions [phone]. For individual providers please see AMION.   No charge

## 2022-09-27 DIAGNOSIS — R6251 Failure to thrive (child): Secondary | ICD-10-CM | POA: Diagnosis not present

## 2022-09-27 DIAGNOSIS — E870 Hyperosmolality and hypernatremia: Secondary | ICD-10-CM | POA: Diagnosis not present

## 2022-09-27 DIAGNOSIS — N179 Acute kidney failure, unspecified: Secondary | ICD-10-CM | POA: Diagnosis not present

## 2022-09-27 DIAGNOSIS — G309 Alzheimer's disease, unspecified: Secondary | ICD-10-CM | POA: Diagnosis not present

## 2022-09-27 LAB — CBC WITH DIFFERENTIAL/PLATELET
Abs Immature Granulocytes: 0.04 10*3/uL (ref 0.00–0.07)
Basophils Absolute: 0 10*3/uL (ref 0.0–0.1)
Basophils Relative: 0 %
Eosinophils Absolute: 0 10*3/uL (ref 0.0–0.5)
Eosinophils Relative: 0 %
HCT: 26.5 % — ABNORMAL LOW (ref 36.0–46.0)
Hemoglobin: 8.1 g/dL — ABNORMAL LOW (ref 12.0–15.0)
Immature Granulocytes: 0 %
Lymphocytes Relative: 10 %
Lymphs Abs: 1 10*3/uL (ref 0.7–4.0)
MCH: 23.3 pg — ABNORMAL LOW (ref 26.0–34.0)
MCHC: 30.6 g/dL (ref 30.0–36.0)
MCV: 76.1 fL — ABNORMAL LOW (ref 80.0–100.0)
Monocytes Absolute: 0.6 10*3/uL (ref 0.1–1.0)
Monocytes Relative: 6 %
Neutro Abs: 8.2 10*3/uL — ABNORMAL HIGH (ref 1.7–7.7)
Neutrophils Relative %: 84 %
Platelets: 593 10*3/uL — ABNORMAL HIGH (ref 150–400)
RBC: 3.48 MIL/uL — ABNORMAL LOW (ref 3.87–5.11)
RDW: 17.4 % — ABNORMAL HIGH (ref 11.5–15.5)
WBC: 9.8 10*3/uL (ref 4.0–10.5)
nRBC: 0 % (ref 0.0–0.2)

## 2022-09-27 LAB — COMPREHENSIVE METABOLIC PANEL
ALT: 25 U/L (ref 0–44)
AST: 31 U/L (ref 15–41)
Albumin: 2 g/dL — ABNORMAL LOW (ref 3.5–5.0)
Alkaline Phosphatase: 76 U/L (ref 38–126)
Anion gap: 9 (ref 5–15)
BUN: 22 mg/dL (ref 8–23)
CO2: 26 mmol/L (ref 22–32)
Calcium: 8.5 mg/dL — ABNORMAL LOW (ref 8.9–10.3)
Chloride: 106 mmol/L (ref 98–111)
Creatinine, Ser: 0.59 mg/dL (ref 0.44–1.00)
GFR, Estimated: >60 mL/min (ref >60)
Glucose, Bld: 129 mg/dL — ABNORMAL HIGH (ref 70–99)
Potassium: 3.7 mmol/L (ref 3.5–5.1)
Sodium: 141 mmol/L (ref 135–145)
Total Bilirubin: 0.3 mg/dL (ref 0.3–1.2)
Total Protein: 6.4 g/dL — ABNORMAL LOW (ref 6.5–8.1)

## 2022-09-27 LAB — PHOSPHORUS: Phosphorus: 2.2 mg/dL — ABNORMAL LOW (ref 2.5–4.6)

## 2022-09-27 LAB — MAGNESIUM: Magnesium: 2 mg/dL (ref 1.7–2.4)

## 2022-09-27 MED ORDER — POTASSIUM CHLORIDE CRYS ER 20 MEQ PO TBCR
40.0000 meq | EXTENDED_RELEASE_TABLET | Freq: Once | ORAL | Status: AC
  Administered 2022-09-27: 40 meq via ORAL
  Filled 2022-09-27: qty 2

## 2022-09-27 MED ORDER — K PHOS MONO-SOD PHOS DI & MONO 155-852-130 MG PO TABS
500.0000 mg | ORAL_TABLET | Freq: Two times a day (BID) | ORAL | Status: AC
  Administered 2022-09-27 (×2): 500 mg via ORAL
  Filled 2022-09-27 (×2): qty 2

## 2022-09-27 NOTE — Progress Notes (Signed)
   09/27/22 1531  Assess: MEWS Score  Temp 97.8 F (36.6 C)  BP 99/66  MAP (mmHg) 77  Pulse Rate (!) 106  Resp 17  Level of Consciousness Alert  SpO2 96 %  O2 Device Room Air  Assess: MEWS Score  MEWS Temp 0  MEWS Systolic 1  MEWS Pulse 1  MEWS RR 0  MEWS LOC 0  MEWS Score 2  MEWS Score Color Yellow  Assess: if the MEWS score is Yellow or Red  Were vital signs taken at a resting state? Yes  Focused Assessment No change from prior assessment  Does the patient meet 2 or more of the SIRS criteria? No  Treat  Pain Scale 0-10  Pain Score Asleep  Patients Stated Pain Goal 0  Pain Intervention(s) Relaxation  Breathing 0  Negative Vocalization 0  Facial Expression 0  Body Language 0  Consolability 0  PAINAD Score 0  Take Vital Signs  Increase Vital Sign Frequency  Yellow: Q 2hr X 2 then Q 4hr X 2, if remains yellow, continue Q 4hrs  Escalate  MEWS: Escalate Yellow: discuss with charge nurse/RN and consider discussing with provider and RRT  Notify: Charge Nurse/RN  Name of Charge Nurse/RN Notified Comptroller  Date Charge Nurse/RN Notified 09/27/22  Time Charge Nurse/RN Notified 47  Document  Patient Outcome Stabilized after interventions  Assess: SIRS CRITERIA  SIRS Temperature  0  SIRS Pulse 1  SIRS Respirations  0  SIRS WBC 0  SIRS Score Sum  1

## 2022-09-27 NOTE — Progress Notes (Signed)
   09/27/22 1730  Vitals  Temp 97.6 F (36.4 C)  BP 107/71  Pulse Rate 100  Resp 18  Level of Consciousness  Level of Consciousness Alert  MEWS COLOR  MEWS Score Color Green  Oxygen Therapy  SpO2 95 %  O2 Device Room Air  Pain Assessment  Pain Scale 0-10  Pain Score Asleep  Patients Stated Pain Goal 0  Pain Intervention(s) Repositioned  PAINAD (Pain Assessment in Advanced Dementia)  Breathing 0  Negative Vocalization 0  Facial Expression 0  Body Language 0  Consolability 0  PAINAD Score 0  MEWS Score  MEWS Temp 0  MEWS Systolic 0  MEWS Pulse 0  MEWS RR 0  MEWS LOC 0  MEWS Score 0

## 2022-09-27 NOTE — Progress Notes (Signed)
PROGRESS NOTE    Donna Simmons  HCW:[id number] DOB: Jan 18, 1941 DOA: 08/25/2022 PCP: [name], DO   Brief Narrative:  Donna Simmons is a 82 y.o. female with past medical history significant for Alzheimer's dementia, HTN, HLD, permanent atrial fibrillation on Eliquis who presented to [facility] ED on 12/25 from [facility] for failure to thrive.  Patient with decreased oral intake over the past 3 days with increased lethargy.  EMS was activated and patient was brought to the ED for further evaluation.  Workup revealed ESBL UTI and patient completed antibiotic course with IV ertapenem. She improved and is currently medically stable and awaiting SNF placement she is a difficult placement.     TOC is involved and following up on Medicaid application status and per TOC is currently being reviewed by DSS but the patient has not gotten the caseworker assigned yet. Caseworker following up with several facilities to start reviewing her for LTC placement and requesting the facility to determine if they can take an LOG.    **09/27/22: Labs are stable. She didn't reatlly want to speak to me today. No acute issues overnight   Assessment and Plan:  ESBL E. coli UTI -Completed 5-day course of ertapenem.   Hypernatremia, improved  -Presented with a sodium level of 167, likely in the setting of poor oral intake/dehydration from hypovolemia.   -Patient was started on IV fluid hydration with normalization of sodium level.   -Continue to encourage increased oral intake.  -Unfortunately, hyponatremia confers a very poor prognosis in the setting of dementia and palliative care was consulted; however family not ready for hospice at this time.   -Na+ Trend: Recent Labs  Lab 09/09/22 0302 09/10/22 0217 09/15/22 0308 09/17/22 0325 09/22/22 0239 09/24/22 1159 09/27/22 0136  NA 140 140 138 138 140 139 141  -Continue intermittent BMPs    Thrombocytosis -Etiology in the setting of active infection as  above.   -Had resolved but now worsened as Plt Count Trend: Recent Labs  Lab 09/04/22 0256 09/08/22 0243 09/10/22 0217 09/15/22 0308 09/22/22 0239 09/24/22 1159 09/27/22 0136  PLT 249 336 358 503* 643* 631* 593*  -Continue to Monitor and Trend Intermittently and continue to Monitor for S/Sx of Bleeding -Stable from last check    Hypokalemia -K+ Trend Recent Labs  Lab 09/09/22 0302 09/10/22 0217 09/15/22 0308 09/17/22 0325 09/22/22 0239 09/24/22 1159 09/27/22 0136  K 3.6 3.6 3.9 3.5 3.8 3.5 3.7  -Replete with po Kcl 40 mEQ x1 and po K Phos Neutral 500 mg po BID x2 -Continue to Monitor and Trend K+ Intermittently  Hypophosphatemia -Phos Level Trend: Recent Labs  Lab 09/01/22 0212 09/01/22 1005 09/01/22 1805 09/02/22 0246 09/02/22 1059 09/24/22 1159 09/27/22 0136  PHOS 2.5 3.1 3.0 3.0 3.1 3.0 2.2*  -Replete with po K Phos Neutral 500 mg po BID x2 -Continue to Monitor and Replete as Necessary -Repeat CMP intermittently    Acute renal failure: Resolved -Presented with a creatinine 1.1, baseline 0.7. -Etiology likely secondary to severe dehydration, now resolved following IV fluid hydration.  -BUN/Cr Trend: Recent Labs  Lab 09/09/22 0302 09/10/22 0217 09/15/22 0308 09/17/22 0325 09/22/22 0239 09/24/22 1159 09/27/22 0136  BUN 6* 11 15 10 20 19 22   CREATININE 0.57 0.57 0.56 0.59 0.53 0.64 0.59  -Avoid Nephrotoxic Medications, Contrast Dyes, Hypotension and Dehydration to Ensure Adequate Renal Perfusion and will need to Renally Adjust Meds -Continue to Monitor and Trend Renal Function carefully and repeat CMP intermittently  Transaminitis -Improved with IV fluid hydration. -LFT Trend: Recent Labs  Lab 08/30/22 0215 09/04/22 0256 09/10/22 0217 09/15/22 0308 09/17/22 0325 09/24/22 1159 09/27/22 0136  AST 24 22 26  44* 11* 21 31  ALT 31 21 22  35 21 25 25   -Continue to Monitor and Tend CMP intermittently    Hypoalbuminemia -In the setting of poor  oral intake, underlying dementia.   -Continue to encourage increased oral intake. -Albumin Level Trend: Recent Labs  Lab 09/02/22 1059 09/04/22 0256 09/10/22 0217 09/15/22 0308 09/17/22 0325 09/24/22 1159 09/27/22 0136  ALBUMIN 2.4* 2.2* 2.2* 2.4* 2.0* 2.0* 2.0*  -Continue to Monitor and Trend CMP Intermittently     Iron Deficiency Anemia -Hgb/Hct Trend Recent Labs  Lab 09/04/22 0256 09/08/22 0243 09/10/22 0217 09/15/22 0308 09/22/22 0239 09/24/22 1159 09/27/22 0136  HGB 9.4* 8.9* 8.7* 8.9* 8.8* 8.1* 8.1*  HCT 29.6* 27.5* 27.2* 28.3* 29.7* 26.5* 26.5*  MCV 78.9* 77.5* 77.3* 77.5* 79.6* 77.3* 76.1*  -Continue to monitor for signs or symptoms of bleeding; no overt bleeding noted -Repeat CBC intermittently and will repeat in the morning   Permanent Atrial Fibrillation -C/w Flecainide 50 mg p.o. twice daily -C/w Apixaban 5 mg p.o. twice daily -Was Holding home metoprolol in the setting of soft blood pressures   Insomnia -C/w Melatonin 5 mg p.o. nightly as needed insomnia   Alzheimer's Dementia -C/w Delirium precautions -Get up during the day -Encourage a familiar face to remain present throughout the day -Keep blinds open and lights on during daylight hours -Minimize the use of opioids/benzodiazepines -Evaluated by palliative care, family not ready for patient to enroll in hospice services yet.   Pressure Ulcers, not present on Admission Pressure Injury 09/04/22 Sacrum Unstageable - Full thickness tissue loss in which the base of the injury is covered by slough (yellow, tan, gray, green or brown) and/or eschar (tan, brown or black) in the wound bed. (Active)  09/04/22 1030  Location: Sacrum  Location Orientation:   Staging: Unstageable - Full thickness tissue loss in which the base of the injury is covered by slough (yellow, tan, gray, green or brown) and/or eschar (tan, brown or black) in the wound bed.  Wound Description (Comments):   Present on Admission: No   Per Wound Nurse:  WOC Nurse Consult Note:  Reason for Consult: sacral wound  Wound type: Pressure  Sacrum Unstageable Pressure Injury 2 cms x 1 cms 100% devitalized tissue  R lateral knee Unstageable Pressure Injury 0.5 cms x 0.5 cms 100% devitalized tissue  R medial knee Stage 2 Pressure Injury 1 cm x 1 cm x 0.1 cm 100% pink moist  R great toe full thickness 3 cms x 3 cms x 0.1 cm 50% tan devitalized tissue 50% red moist  L lateral foot Deep Tissure Pressure Injury 2 cms x 1 cm intact blister  R 5th digit dark tissue surrounding; suspect Deep Tissue Injury that has evolved to full thickness 1.5 cms x 2 cms x 0.1 cms pink moist area  R upper posterior thigh full thickness linear wound with a dry scab, 4 cms x 0.3 cms  Pressure Injury POA: Sacral Pressure Injury not present on admission  Dressing procedure/placement/frequency:  Sacrum:  clean wound with NS, apply Medihoney to gauze and place on wound bed daily, cover with foam dressing.  R lateral knee clean with NS, apply Medihoney to gauze and place on wound bed daily, cover with foam dressing.  R medial knee Stage 2 cover with foam dressing.  R  great toe full thickness clean with NS, apply Medihoney to gauze and place in wound bed daily, cover with foam dressing.  L lateral foot cover with foam dressing.  R 5th digit cover with foam dressing.  R upper posterior thigh cover with foam dressing.   All foam dressings to be changed q 3 days and prn soiling.  POC discussed with bedside nurse and patient.  Patient to be placed on low air loss mattress for moisture management and pressure redistribution.  Prevalon boots ordered for off loading of heels.         Severe protein calorie malnutrition -Estimated body mass index is 24.48 kg/m as calculated from the following:   Height as of this encounter: 5\' 2"  (1.575 m).   Weight as of this encounter: 60.7 kg. -Nutrition Status: Nutrition Problem: Severe Malnutrition Etiology: chronic  illness Signs/Symptoms: severe muscle depletion, severe fat depletion Interventions: Ensure Enlive (each supplement provides 350kcal and 20 grams of protein), MVI -Continue to encourage increased oral intake   Disposition -Per social work, patient cannot return to her previous SNF due to financial issues, they will also need to pay 30 days out from which spouse is unable to do so.   -No family to assist at home.   -Difficult placement, Medicaid application pending and patient's spouse states that the Medicaid application is currently being reviewed but has not gotten the case worker yet.   -Currently medically stable for discharge once safe discharge plan acquired.  DVT prophylaxis: SCDs Start: 08/25/22 0659 apixaban (ELIQUIS) tablet 5 mg    Code Status: DNR Family Communication: No family present at bedsdie  Disposition Plan:  Level of care: Med-Surg Status is: Inpatient Remains inpatient appropriate because: Medically stable for discharge once safe disposition found by social work      Consultants:  Palliative Care (signed off as of 09/26/22)  Procedures:  As delineated as above  Antimicrobials:  Anti-infectives (From admission, onward)    Start     Dose/Rate Route Frequency Ordered Stop   09/03/22 1000  ertapenem (INVANZ) 1,000 mg in sodium chloride 0.9 % 100 mL IVPB        1 g 200 mL/hr over 30 Minutes Intravenous Every 24 hours 09/02/22 1421 09/04/22 2048   08/30/22 1500  ertapenem (INVANZ) 1,000 mg in sodium chloride 0.9 % 100 mL IVPB  Status:  Discontinued        1 g 200 mL/hr over 30 Minutes Intravenous Every 24 hours 08/30/22 1419 09/02/22 1421   08/29/22 1115  cefTRIAXone (ROCEPHIN) 1 g in sodium chloride 0.9 % 100 mL IVPB  Status:  Discontinued        1 g 200 mL/hr over 30 Minutes Intravenous Every 24 hours 08/29/22 1017 08/30/22 1419       Subjective: Seen and examined at bedside she is awake but not really interactive with me today and did not really want to  speak.  Would not answer any of my questions when I asked her.  Nursing states that she had a difficult time trying to get her fed but then she started taking her medications.  No family at bedside and she remains medically stable for discharge.  Objective: Vitals:   09/26/22 1906 09/26/22 2110 09/27/22 0503 09/27/22 0743  BP:  (!) 102/53 103/65 107/71  Pulse: 88 (!) 106 93 95  Resp: 16 15 16 18   Temp:  98.2 F (36.8 C) 98.1 F (36.7 C) 97.8 F (36.6 C)  TempSrc:  Oral Oral Oral  SpO2:  99% 98% 98%  Weight:      Height:        Intake/Output Summary (Last 24 hours) at 09/27/2022 1610 Last data filed at 09/27/2022 0146 Gross per 24 hour  Intake 960 ml  Output --  Net 960 ml   Filed Weights   08/30/22 1300 09/24/22 0526  Weight: 50.2 kg 60.7 kg   Examination: Physical Exam:  Constitutional: Thin cachectic chronically ill-appearing African-American female in no acute distress and remains pleasantly demented and confused and did not really want to speak to me today at all Respiratory: Diminished to auscultation bilaterally, no wheezing, rales, rhonchi or crackles. Normal respiratory effort and patient is not tachypenic. No accessory muscle use.  Unlabored breathing Cardiovascular: RRR, no murmurs / rubs / gallops. S1 and S2 auscultated. No extremity edema. Abdomen: Soft, non-tender, non-distended. Bowel sounds positive.  GU: Deferred. Musculoskeletal: No clubbing / cyanosis of digits/nails. No joint deformity upper and lower extremities.  Skin: Has ulcers in the lower extremities noted that are covered no appreciable rashes on limited skin evaluation.  Neurologic: She is awake but does not really want to participate in oral examination to her current condition. Psychiatric: Impaired judgment and insight  Data Reviewed: I have personally reviewed following labs and imaging studies  CBC: Recent Labs  Lab 09/22/22 0239 09/24/22 1159 09/27/22 0136  WBC 7.9 9.9 9.8  NEUTROABS   --  8.2* 8.2*  HGB 8.8* 8.1* 8.1*  HCT 29.7* 26.5* 26.5*  MCV 79.6* 77.3* 76.1*  PLT 643* 631* 593*   Basic Metabolic Panel: Recent Labs  Lab 09/22/22 0239 09/24/22 1159 09/27/22 0136  NA 140 139 141  K 3.8 3.5 3.7  CL 104 103 106  CO2 26 26 26   GLUCOSE 104* 101* 129*  BUN 20 19 22   CREATININE 0.53 0.64 0.59  CALCIUM 8.7* 8.3* 8.5*  MG 2.1 2.0 2.0  PHOS  --  3.0 2.2*   GFR: Estimated Creatinine Clearance: 48.9 mL/min (by C-G formula based on SCr of 0.59 mg/dL). Liver Function Tests: Recent Labs  Lab 09/24/22 1159 09/27/22 0136  AST 21 31  ALT 25 25  ALKPHOS 68 76  BILITOT 0.4 0.3  PROT 6.3* 6.4*  ALBUMIN 2.0* 2.0*   No results for input(s): "LIPASE", "AMYLASE" in the last 168 hours. No results for input(s): "AMMONIA" in the last 168 hours. Coagulation Profile: No results for input(s): "INR", "PROTIME" in the last 168 hours. Cardiac Enzymes: No results for input(s): "CKTOTAL", "CKMB", "CKMBINDEX", "TROPONINI" in the last 168 hours. BNP (last 3 results) No results for input(s): "PROBNP" in the last 8760 hours. HbA1C: No results for input(s): "HGBA1C" in the last 72 hours. CBG: No results for input(s): "GLUCAP" in the last 168 hours. Lipid Profile: No results for input(s): "CHOL", "HDL", "LDLCALC", "TRIG", "CHOLHDL", "LDLDIRECT" in the last 72 hours. Thyroid Function Tests: No results for input(s): "TSH", "T4TOTAL", "FREET4", "T3FREE", "THYROIDAB" in the last 72 hours. Anemia Panel: No results for input(s): "VITAMINB12", "FOLATE", "FERRITIN", "TIBC", "IRON", "RETICCTPCT" in the last 72 hours. Sepsis Labs: No results for input(s): "PROCALCITON", "LATICACIDVEN" in the last 168 hours.  No results found for this or any previous visit (from the past 240 hour(s)).   Radiology Studies: No results found.  Scheduled Meds:  apixaban  5 mg Oral BID   feeding supplement  237 mL Oral TID BM   ferrous sulfate  325 mg Oral QODAY   flecainide  50 mg Oral BID    leptospermum manuka honey  1 Application Topical Daily   multivitamin with minerals  1 tablet Oral Daily   pantoprazole  40 mg Oral Daily   phosphorus  500 mg Oral BID   potassium chloride  40 mEq Oral Once   Continuous Infusions:   LOS: 32 days   Marguerita Merles, DO [facility] Available via Epic secure chat 7am-7pm After these hours, please refer to coverage provider listed on [email or web address] 09/27/2022, 9:05 AM

## 2022-09-28 DIAGNOSIS — N179 Acute kidney failure, unspecified: Secondary | ICD-10-CM | POA: Diagnosis not present

## 2022-09-28 DIAGNOSIS — E870 Hyperosmolality and hypernatremia: Secondary | ICD-10-CM | POA: Diagnosis not present

## 2022-09-28 DIAGNOSIS — G309 Alzheimer's disease, unspecified: Secondary | ICD-10-CM | POA: Diagnosis not present

## 2022-09-28 DIAGNOSIS — R6251 Failure to thrive (child): Secondary | ICD-10-CM | POA: Diagnosis not present

## 2022-09-28 MED ORDER — SODIUM CHLORIDE 0.9 % IV BOLUS
500.0000 mL | Freq: Once | INTRAVENOUS | Status: AC
  Administered 2022-09-28: 500 mL via INTRAVENOUS

## 2022-09-28 NOTE — Plan of Care (Signed)
  Problem: Clinical Measurements: Goal: Will remain free from infection Outcome: Progressing Goal: Respiratory complications will improve Outcome: Progressing   Problem: Coping: Goal: Level of anxiety will decrease Outcome: Progressing   Problem: Elimination: Goal: Will not experience complications related to bowel motility Outcome: Progressing   Problem: Pain Managment: Goal: General experience of comfort will improve Outcome: Progressing   Problem: Nutrition: Goal: Adequate nutrition will be maintained Outcome: Not Progressing

## 2022-09-28 NOTE — Progress Notes (Signed)
PROGRESS NOTE    POLA FURNO  ZOX:[id number] DOB: 1941/02/27 DOA: 08/25/2022 PCP: Yolanda Manges, DO   Brief Narrative:  Donna Simmons is a 82 y.o. female with past medical history significant for Alzheimer's dementia, HTN, HLD, permanent atrial fibrillation on Eliquis who presented to [facility] ED on 12/25 from [facility] for failure to thrive.  Patient with decreased oral intake over the past 3 days with increased lethargy.  EMS was activated and patient was brought to the ED for further evaluation.  Workup revealed ESBL UTI and patient completed antibiotic course with IV ertapenem. She improved and is currently medically stable and awaiting SNF placement she is a difficult placement.     TOC is involved and following up on Medicaid application status and per TOC is currently being reviewed by DSS but the patient has not gotten the caseworker assigned yet. Caseworker following up with several facilities to start reviewing her for LTC placement and requesting the facility to determine if they can take an LOG.     **09/27/22: Labs are stable. She didn't reatlly want to speak to me today. No acute issues overnight   **09/28/22: Blood pressure is on the lower side this morning and patient was not eating as well so we will give her 500 mL bolus.  After her husband came she started eating better and all her meal.  Blood pressure is now improved after the bolus.  Will need to continue to monitor however she still remains medically stable for discharge.  Assessment and Plan:  ESBL E. coli UTI -Completed 5-day course of ertapenem.   Hypernatremia, improved  -Presented with a sodium level of 167, likely in the setting of poor oral intake/dehydration from hypovolemia.   -Patient was started on IV fluid hydration with normalization of sodium level.   -Continue to encourage increased oral intake.  -Unfortunately, hyponatremia confers a very poor prognosis in the setting of dementia and palliative  care was consulted; however family not ready for hospice at this time.   -Na+ Trend: Recent Labs  Lab 09/09/22 0302 09/10/22 0217 09/15/22 0308 09/17/22 0325 09/22/22 0239 09/24/22 1159 09/27/22 0136  NA 140 140 138 138 140 139 141  -Continue intermittent BMPs    Thrombocytosis -Etiology in the setting of active infection as above.   -Had resolved but now worsened as Plt Count Trend: Recent Labs  Lab 09/04/22 0256 09/08/22 0243 09/10/22 0217 09/15/22 0308 09/22/22 0239 09/24/22 1159 09/27/22 0136  PLT 249 336 358 503* 643* 631* 593*  -Continue to Monitor and Trend Intermittently and continue to Monitor for S/Sx of Bleeding -Stable from last check    Hypokalemia -K+ Trend Recent Labs  Lab 09/09/22 0302 09/10/22 0217 09/15/22 0308 09/17/22 0325 09/22/22 0239 09/24/22 1159 09/27/22 0136  K 3.6 3.6 3.9 3.5 3.8 3.5 3.7  -Replete with po Kcl 40 mEQ x1 and po K Phos Neutral 500 mg po BID x2 yesterday -Continue to Monitor and Trend K+ Intermittently   Hypophosphatemia -Phos Level Trend: Recent Labs  Lab 09/01/22 0212 09/01/22 1005 09/01/22 1805 09/02/22 0246 09/02/22 1059 09/24/22 1159 09/27/22 0136  PHOS 2.5 3.1 3.0 3.0 3.1 3.0 2.2*  -Replete with po K Phos Neutral 500 mg po BID x2 -Continue to Monitor and Replete as Necessary -Repeat CMP intermittently    Acute renal failure: Resolved -Presented with a creatinine 1.1, baseline 0.7. -Etiology likely secondary to severe dehydration, now resolved following IV fluid hydration.  -BUN/Cr Trend: Recent Labs  Lab 09/09/22  0302 09/10/22 0217 09/15/22 0308 09/17/22 0325 09/22/22 0239 09/24/22 1159 09/27/22 0136  BUN 6* 11 15 10 20 19 22   CREATININE 0.57 0.57 0.56 0.59 0.53 0.64 0.59  -Avoid Nephrotoxic Medications, Contrast Dyes, Hypotension and Dehydration to Ensure Adequate Renal Perfusion and will need to Renally Adjust Meds -Continue to Monitor and Trend Renal Function carefully and repeat CMP  intermittently    Transaminitis -Improved with IV fluid hydration. -LFT Trend: Recent Labs  Lab 08/30/22 0215 09/04/22 0256 09/10/22 0217 09/15/22 0308 09/17/22 0325 09/24/22 1159 09/27/22 0136  AST 24 22 26  44* 11* 21 31  ALT 31 21 22  35 21 25 25   -Continue to Monitor and Tend CMP intermittently    Hypoalbuminemia -In the setting of poor oral intake, underlying dementia.   -Continue to encourage increased oral intake. -Albumin Level Trend: Recent Labs  Lab 09/02/22 1059 09/04/22 0256 09/10/22 0217 09/15/22 0308 09/17/22 0325 09/24/22 1159 09/27/22 0136  ALBUMIN 2.4* 2.2* 2.2* 2.4* 2.0* 2.0* 2.0*  -Continue to Monitor and Trend CMP Intermittently     Iron Deficiency Anemia -Hgb/Hct Trend Recent Labs  Lab 09/04/22 0256 09/08/22 0243 09/10/22 0217 09/15/22 0308 09/22/22 0239 09/24/22 1159 09/27/22 0136  HGB 9.4* 8.9* 8.7* 8.9* 8.8* 8.1* 8.1*  HCT 29.6* 27.5* 27.2* 28.3* 29.7* 26.5* 26.5*  MCV 78.9* 77.5* 77.3* 77.5* 79.6* 77.3* 76.1*  -Continue to monitor for signs or symptoms of bleeding; no overt bleeding noted -Repeat CBC intermittently and will repeat in the morning   Permanent Atrial Fibrillation -C/w Flecainide 50 mg p.o. twice daily -C/w Apixaban 5 mg p.o. twice daily -Was Holding home metoprolol in the setting of soft blood pressures but may need to resume if blood pressure is improved -Heart rates are slightly elevated today so she was given a 500 mL bolus   Insomnia -C/w Melatonin 5 mg p.o. nightly as needed insomnia   Alzheimer's Dementia -C/w Delirium precautions -Get up during the day -Encourage a familiar face to remain present throughout the day -Keep blinds open and lights on during daylight hours -Minimize the use of opioids/benzodiazepines -Evaluated by palliative care, family not ready for patient to enroll in hospice services yet.   Pressure Ulcers, not present on Admission Pressure Injury 09/04/22 Sacrum Unstageable - Full  thickness tissue loss in which the base of the injury is covered by slough (yellow, tan, gray, green or brown) and/or eschar (tan, brown or black) in the wound bed. (Active)  09/04/22 1030  Location: Sacrum  Location Orientation:   Staging: Unstageable - Full thickness tissue loss in which the base of the injury is covered by slough (yellow, tan, gray, green or brown) and/or eschar (tan, brown or black) in the wound bed.  Wound Description (Comments):   Present on Admission: No  Per Wound Nurse:  [facility] Nurse Consult Note:  Reason for Consult: sacral wound  Wound type: Pressure  Sacrum Unstageable Pressure Injury 2 cms x 1 cms 100% devitalized tissue  R lateral knee Unstageable Pressure Injury 0.5 cms x 0.5 cms 100% devitalized tissue  R medial knee Stage 2 Pressure Injury 1 cm x 1 cm x 0.1 cm 100% pink moist  R great toe full thickness 3 cms x 3 cms x 0.1 cm 50% tan devitalized tissue 50% red moist  L lateral foot Deep Tissure Pressure Injury 2 cms x 1 cm intact blister  R 5th digit dark tissue surrounding; suspect Deep Tissue Injury that has evolved to full thickness 1.5 cms x 2 cms x  0.1 cms pink moist area  R upper posterior thigh full thickness linear wound with a dry scab, 4 cms x 0.3 cms  Pressure Injury POA: Sacral Pressure Injury not present on admission  Dressing procedure/placement/frequency:  Sacrum:  clean wound with NS, apply Medihoney to gauze and place on wound bed daily, cover with foam dressing.  R lateral knee clean with NS, apply Medihoney to gauze and place on wound bed daily, cover with foam dressing.  R medial knee Stage 2 cover with foam dressing.  R great toe full thickness clean with NS, apply Medihoney to gauze and place in wound bed daily, cover with foam dressing.  L lateral foot cover with foam dressing.  R 5th digit cover with foam dressing.  R upper posterior thigh cover with foam dressing.   All foam dressings to be changed q 3 days and prn soiling.  POC  discussed with bedside nurse and patient.  Patient to be placed on low air loss mattress for moisture management and pressure redistribution.  Prevalon boots ordered for off loading of heels.         Severe protein calorie malnutrition -Estimated body mass index is 24.48 kg/m as calculated from the following:   Height as of this encounter: 5\' 2"  (1.575 m).   Weight as of this encounter: 60.7 kg. -Nutrition Status: Nutrition Problem: Severe Malnutrition Etiology: chronic illness Signs/Symptoms: severe muscle depletion, severe fat depletion Interventions: Ensure Enlive (each supplement provides 350kcal and 20 grams of protein), MVI -Continue to encourage increased oral intake   Disposition -Per social work, patient cannot return to her previous SNF due to financial issues, they will also need to pay 30 days out from which spouse is unable to do so.   -No family to assist at home.   -Difficult placement, Medicaid application pending and patient's spouse states that the Medicaid application is currently being reviewed but has not gotten the case worker yet.   -Currently medically stable for discharge once safe discharge plan acquired.  DVT prophylaxis: SCDs Start: 08/25/22 0659 apixaban (ELIQUIS) tablet 5 mg    Code Status: DNR Family Communication: Discussed with husband at bedside   Disposition Plan:  Level of care: Med-Surg Status is: Inpatient Remains inpatient appropriate because: Medically stable for discharge once safe disposition found by social work       Consultants:  Palliative Care (signed off as of 09/26/22)   Procedures:  As delineated as above   Antimicrobials:  Anti-infectives (From admission, onward)    Start     Dose/Rate Route Frequency Ordered Stop   09/03/22 1000  ertapenem (INVANZ) 1,000 mg in sodium chloride 0.9 % 100 mL IVPB        1 g 200 mL/hr over 30 Minutes Intravenous Every 24 hours 09/02/22 1421 09/04/22 2048   08/30/22 1500  ertapenem (INVANZ)  1,000 mg in sodium chloride 0.9 % 100 mL IVPB  Status:  Discontinued        1 g 200 mL/hr over 30 Minutes Intravenous Every 24 hours 08/30/22 1419 09/02/22 1421   08/29/22 1115  cefTRIAXone (ROCEPHIN) 1 g in sodium chloride 0.9 % 100 mL IVPB  Status:  Discontinued        1 g 200 mL/hr over 30 Minutes Intravenous Every 24 hours 08/29/22 1017 08/30/22 1419       Subjective: Seen and examined at bedside this is withdrawn this morning but states that she felt okay denies any pain.  She did not really eat this  morning with the nursing staff however she ate when her husband came.  She had no acute complaints overnight and husband had no questions and she remains medically stable for discharge at this time.  Objective: Vitals:   09/28/22 0001 09/28/22 0520 09/28/22 0825 09/28/22 1037  BP: 105/69 113/72 (!) 89/59 114/84  Pulse: (!) 112 (!) 116 (!) 110 (!) 119  Resp: 17 16 17    Temp: 97.9 F (36.6 C) 98 F (36.7 C) 98 F (36.7 C)   TempSrc: Oral Oral    SpO2: 100% 97% 100% 95%  Weight:      Height:        Intake/Output Summary (Last 24 hours) at 09/28/2022 1151 Last data filed at 09/27/2022 1329 Gross per 24 hour  Intake 230 ml  Output --  Net 230 ml   Filed Weights   08/30/22 1300 09/24/22 0526  Weight: 50.2 kg 60.7 kg   Examination: Physical Exam:  Constitutional: Patient is a thin cachectic chronically ill-appearing African-American female currently in no acute distress appears pleasantly demented and confused but more alert than her husband is here Respiratory: Diminished to auscultation bilaterally, no wheezing, rales, rhonchi or crackles. Normal respiratory effort and patient is not tachypenic. No accessory muscle use.  Unlabored breathing Cardiovascular: RRR, no murmurs / rubs / gallops. S1 and S2 auscultated.  Abdomen: Soft, non-tender, nondistended.  Bowel sounds positive.  GU: Deferred. Musculoskeletal: No clubbing / cyanosis of digits/nails. No joint deformity upper and  lower extremities. Skin: Has multiple ulcers and wounds on the lower extremities no facial rashes or lesions on limited skin evaluation Neurologic: CN 2-12 grossly intact with no focal deficits.  Romberg sign and cerebellar reflexes not assessed. Psychiatric: Impaired judgment and insight  Data Reviewed: I have personally reviewed following labs and imaging studies  CBC: Recent Labs  Lab 09/22/22 0239 09/24/22 1159 09/27/22 0136  WBC 7.9 9.9 9.8  NEUTROABS  --  8.2* 8.2*  HGB 8.8* 8.1* 8.1*  HCT 29.7* 26.5* 26.5*  MCV 79.6* 77.3* 76.1*  PLT 643* 631* 593*   Basic Metabolic Panel: Recent Labs  Lab 09/22/22 0239 09/24/22 1159 09/27/22 0136  NA 140 139 141  K 3.8 3.5 3.7  CL 104 103 106  CO2 26 26 26   GLUCOSE 104* 101* 129*  BUN 20 19 22   CREATININE 0.53 0.64 0.59  CALCIUM 8.7* 8.3* 8.5*  MG 2.1 2.0 2.0  PHOS  --  3.0 2.2*   GFR: Estimated Creatinine Clearance: 48.9 mL/min (by C-G formula based on SCr of 0.59 mg/dL). Liver Function Tests: Recent Labs  Lab 09/24/22 1159 09/27/22 0136  AST 21 31  ALT 25 25  ALKPHOS 68 76  BILITOT 0.4 0.3  PROT 6.3* 6.4*  ALBUMIN 2.0* 2.0*   No results for input(s): "LIPASE", "AMYLASE" in the last 168 hours. No results for input(s): "AMMONIA" in the last 168 hours. Coagulation Profile: No results for input(s): "INR", "PROTIME" in the last 168 hours. Cardiac Enzymes: No results for input(s): "CKTOTAL", "CKMB", "CKMBINDEX", "TROPONINI" in the last 168 hours. BNP (last 3 results) No results for input(s): "PROBNP" in the last 8760 hours. HbA1C: No results for input(s): "HGBA1C" in the last 72 hours. CBG: No results for input(s): "GLUCAP" in the last 168 hours. Lipid Profile: No results for input(s): "CHOL", "HDL", "LDLCALC", "TRIG", "CHOLHDL", "LDLDIRECT" in the last 72 hours. Thyroid Function Tests: No results for input(s): "TSH", "T4TOTAL", "FREET4", "T3FREE", "THYROIDAB" in the last 72 hours. Anemia Panel: No results for  input(s): "  VITAMINB12", "FOLATE", "FERRITIN", "TIBC", "IRON", "RETICCTPCT" in the last 72 hours. Sepsis Labs: No results for input(s): "PROCALCITON", "LATICACIDVEN" in the last 168 hours.  No results found for this or any previous visit (from the past 240 hour(s)).   Radiology Studies: No results found.  Scheduled Meds:  apixaban  5 mg Oral BID   feeding supplement  237 mL Oral TID BM   ferrous sulfate  325 mg Oral QODAY   flecainide  50 mg Oral BID   leptospermum manuka honey  1 Application Topical Daily   multivitamin with minerals  1 tablet Oral Daily   pantoprazole  40 mg Oral Daily   Continuous Infusions:   LOS: 33 days   Raiford Noble, DO [facility] Available via Epic secure chat 7am-7pm After these hours, please refer to coverage provider listed on [email or web address] 09/28/2022, 11:51 AM

## 2022-09-28 NOTE — Progress Notes (Signed)
   09/28/22 0825  Assess: MEWS Score  Temp 98 F (36.7 C)  BP (!) 89/59  MAP (mmHg) 69  Pulse Rate (!) 110  Resp 17  SpO2 100 %  O2 Device Room Air  Assess: MEWS Score  MEWS Temp 0  MEWS Systolic 1  MEWS Pulse 1  MEWS RR 0  MEWS LOC 0  MEWS Score 2  MEWS Score Color Yellow  Assess: if the MEWS score is Yellow or Red  Were vital signs taken at a resting state? Yes  Focused Assessment No change from prior assessment  Does the patient meet 2 or more of the SIRS criteria? No  MEWS guidelines implemented *See Row Information* No, previously yellow, continue vital signs every 4 hours  Treat  MEWS Interventions Other (Comment);Administered scheduled meds/treatments (MD put new orders)  Pain Scale PAINAD  Pain Score 0  Faces Pain Scale 0  Breathing 0  Negative Vocalization 0  Facial Expression 0  Body Language 0  Consolability 0  PAINAD Score 0  Notify: Charge Nurse/RN  Name of Charge Nurse/RN Notified Jatavious Peppard R  Date Charge Nurse/RN Notified 09/28/22  Time Charge Nurse/RN Notified [id number]  Provider Notification  Provider Name/Title [facility]  Provider response See new orders  Date of Provider Response 09/28/22  Document  Patient Outcome Stabilized after interventions  Assess: SIRS CRITERIA  SIRS Temperature  0  SIRS Pulse 1  SIRS Respirations  0  SIRS WBC 0  SIRS Score Sum  1

## 2022-09-29 DIAGNOSIS — K219 Gastro-esophageal reflux disease without esophagitis: Secondary | ICD-10-CM | POA: Diagnosis not present

## 2022-09-29 DIAGNOSIS — G309 Alzheimer's disease, unspecified: Secondary | ICD-10-CM | POA: Diagnosis not present

## 2022-09-29 DIAGNOSIS — N179 Acute kidney failure, unspecified: Secondary | ICD-10-CM | POA: Diagnosis not present

## 2022-09-29 DIAGNOSIS — E870 Hyperosmolality and hypernatremia: Secondary | ICD-10-CM | POA: Diagnosis not present

## 2022-09-29 LAB — CBC
HCT: 27.6 % — ABNORMAL LOW (ref 36.0–46.0)
Hemoglobin: 8.6 g/dL — ABNORMAL LOW (ref 12.0–15.0)
MCH: 23.6 pg — ABNORMAL LOW (ref 26.0–34.0)
MCHC: 31.2 g/dL (ref 30.0–36.0)
MCV: 75.6 fL — ABNORMAL LOW (ref 80.0–100.0)
Platelets: 593 10*3/uL — ABNORMAL HIGH (ref 150–400)
RBC: 3.65 MIL/uL — ABNORMAL LOW (ref 3.87–5.11)
RDW: 18 % — ABNORMAL HIGH (ref 11.5–15.5)
WBC: 10.5 10*3/uL (ref 4.0–10.5)
nRBC: 0 % (ref 0.0–0.2)

## 2022-09-29 LAB — BASIC METABOLIC PANEL
Anion gap: 9 (ref 5–15)
BUN: 25 mg/dL — ABNORMAL HIGH (ref 8–23)
CO2: 28 mmol/L (ref 22–32)
Calcium: 8.6 mg/dL — ABNORMAL LOW (ref 8.9–10.3)
Chloride: 110 mmol/L (ref 98–111)
Creatinine, Ser: 0.58 mg/dL (ref 0.44–1.00)
GFR, Estimated: >60 mL/min (ref >60)
Glucose, Bld: 103 mg/dL — ABNORMAL HIGH (ref 70–99)
Potassium: 3.2 mmol/L — ABNORMAL LOW (ref 3.5–5.1)
Sodium: 147 mmol/L — ABNORMAL HIGH (ref 135–145)

## 2022-09-29 LAB — PHOSPHORUS: Phosphorus: 3.6 mg/dL (ref 2.5–4.6)

## 2022-09-29 LAB — MAGNESIUM: Magnesium: 2.4 mg/dL (ref 1.7–2.4)

## 2022-09-29 MED ORDER — POTASSIUM CHLORIDE CRYS ER 20 MEQ PO TBCR
40.0000 meq | EXTENDED_RELEASE_TABLET | Freq: Two times a day (BID) | ORAL | Status: AC
  Administered 2022-09-29 (×2): 40 meq via ORAL
  Filled 2022-09-29 (×2): qty 2

## 2022-09-29 MED ORDER — DEXTROSE 5 % IV SOLN: INTRAVENOUS | Status: DC

## 2022-09-29 MED ORDER — POTASSIUM CHLORIDE 10 MEQ/100ML IV SOLN
10.0000 meq | INTRAVENOUS | Status: AC
  Administered 2022-09-29 (×4): 10 meq via INTRAVENOUS
  Filled 2022-09-29 (×4): qty 100

## 2022-09-29 NOTE — Progress Notes (Signed)
PROGRESS NOTE    Donna Simmons  WEX:[id number] DOB: 08-01-1941 DOA: 08/25/2022 PCP: [name], DO   Brief Narrative:  Donna Simmons is a 82 y.o. female with past medical history significant for Alzheimer's dementia, HTN, HLD, permanent atrial fibrillation on Eliquis who presented to [facility] ED on 12/25 from [facility] for failure to thrive.  Patient with decreased oral intake over the past 3 days with increased lethargy.  EMS was activated and patient was brought to the ED for further evaluation.  Workup revealed ESBL UTI and patient completed antibiotic course with IV ertapenem. She improved and is currently medically stable and awaiting SNF placement she is a difficult placement.     TOC is involved and following up on Medicaid application status and per TOC is currently being reviewed by DSS but the patient has not gotten the caseworker assigned yet. Caseworker following up with several facilities to start reviewing her for LTC placement and requesting the facility to determine if they can take an LOG.     **09/27/22: Labs are stable. She didn't reatlly want to speak to me today. No acute issues overnight    **09/28/22: Blood pressure is on the lower side this morning and patient was not eating as well so we will give her 500 mL bolus.  After her husband came she started eating better and all her meal.  Blood pressure is now improved after the bolus.  Will need to continue to monitor however she still remains medically stable for discharge.  **09/29/22: Na+ is elevated again and will start D5W and will replete Potassium    Assessment and Plan:  ESBL E. coli UTI -Completed 5-day course of ertapenem.   Hypernatremia -Presented with a sodium level of 167, likely in the setting of poor oral intake/dehydration from hypovolemia.   -Patient was started on IV fluid hydration with normalization of sodium level and had stopped but now will resume at D5W at 75 mL/hr -Continue to encourage  increased oral intake.  -Unfortunately, hyponatremia confers a very poor prognosis in the setting of dementia and palliative care was consulted; however family not ready for hospice at this time.   -Na+ Trend: Recent Labs  Lab 09/10/22 0217 09/15/22 0308 09/17/22 0325 09/22/22 0239 09/24/22 1159 09/27/22 0136 09/29/22 0303  NA 140 138 138 140 139 141 147*  -Continue intermittent BMPs    Thrombocytosis -Etiology in the setting of active infection as above.   -Had resolved but now worsened as Plt Count Trend: Recent Labs  Lab 09/08/22 0243 09/10/22 0217 09/15/22 0308 09/22/22 0239 09/24/22 1159 09/27/22 0136 09/29/22 0303  PLT 336 358 503* 643* 631* 593* 593*  -Continue to Monitor and Trend Intermittently and continue to Monitor for S/Sx of Bleeding -Stable from last check    Hypokalemia -K+ Trend Recent Labs  Lab 09/10/22 0217 09/15/22 0308 09/17/22 0325 09/22/22 0239 09/24/22 1159 09/27/22 0136 09/29/22 0303  K 3.6 3.9 3.5 3.8 3.5 3.7 3.2*  -Replete with po Kcl 40 mEQ po BID x2 -Continue to Monitor and Trend K+ Intermittently   Hypophosphatemia -Phos Level Trend: Recent Labs  Lab 09/01/22 1005 09/01/22 1805 09/02/22 0246 09/02/22 1059 09/24/22 1159 09/27/22 0136 09/29/22 0303  PHOS 3.1 3.0 3.0 3.1 3.0 2.2* 3.6  -Replete with po K Phos Neutral 500 mg po BID x2 -Continue to Monitor and Replete as Necessary -Repeat CMP intermittently    Acute renal failure: Resolved -Presented with a creatinine 1.1, baseline 0.7. -Etiology likely secondary to  severe dehydration, now resolved following IV fluid hydration.  -BUN/Cr Trend: Recent Labs  Lab 09/10/22 0217 09/15/22 0308 09/17/22 0325 09/22/22 0239 09/24/22 1159 09/27/22 0136 09/29/22 0303  BUN 11 15 10 20 19 22  25*  CREATININE 0.57 0.56 0.59 0.53 0.64 0.59 0.58  -Avoid Nephrotoxic Medications, Contrast Dyes, Hypotension and Dehydration to Ensure Adequate Renal Perfusion and will need to Renally  Adjust Meds -Continue to Monitor and Trend Renal Function carefully and repeat CMP intermittently    Transaminitis -Improved with IV fluid hydration. -LFT Trend: Recent Labs  Lab 09/04/22 0256 09/10/22 0217 09/15/22 0308 09/17/22 0325 09/24/22 1159 09/27/22 0136  AST 22 26 44* 11* 21 31  ALT 21 22 35 21 25 25   -Continue to Monitor and Tend CMP intermittently    Hypoalbuminemia -In the setting of poor oral intake, underlying dementia.   -Continue to encourage increased oral intake. -Albumin Level Trend: Recent Labs  Lab 09/02/22 1059 09/04/22 0256 09/10/22 0217 09/15/22 0308 09/17/22 0325 09/24/22 1159 09/27/22 0136  ALBUMIN 2.4* 2.2* 2.2* 2.4* 2.0* 2.0* 2.0*  -Continue to Monitor and Trend CMP Intermittently     Iron Deficiency Anemia -Hgb/Hct Trend Recent Labs  Lab 09/08/22 0243 09/10/22 0217 09/15/22 0308 09/22/22 0239 09/24/22 1159 09/27/22 0136 09/29/22 0303  HGB 8.9* 8.7* 8.9* 8.8* 8.1* 8.1* 8.6*  HCT 27.5* 27.2* 28.3* 29.7* 26.5* 26.5* 27.6*  MCV 77.5* 77.3* 77.5* 79.6* 77.3* 76.1* 75.6*  -Continue to monitor for signs or symptoms of bleeding; no overt bleeding noted -Repeat CBC intermittently and will repeat in the morning   Permanent Atrial Fibrillation -C/w Flecainide 50 mg p.o. twice daily -C/w Apixaban 5 mg p.o. twice daily -Was Holding home metoprolol in the setting of soft blood pressures but may need to resume if blood pressure is improved -Heart rates are slightly elevated today so she was given a 500 mL bolus   Insomnia -C/w Melatonin 5 mg p.o. nightly as needed insomnia   Alzheimer's Dementia -C/w Delirium precautions -Get up during the day -Encourage a familiar face to remain present throughout the day -Keep blinds open and lights on during daylight hours -Minimize the use of opioids/benzodiazepines -Evaluated by palliative care, family not ready for patient to enroll in hospice services yet.   Pressure Ulcers, not present on  Admission Pressure Injury 09/04/22 Sacrum Unstageable - Full thickness tissue loss in which the base of the injury is covered by slough (yellow, tan, gray, green or brown) and/or eschar (tan, brown or black) in the wound bed. (Active)  09/04/22 1030  Location: Sacrum  Location Orientation:   Staging: Unstageable - Full thickness tissue loss in which the base of the injury is covered by slough (yellow, tan, gray, green or brown) and/or eschar (tan, brown or black) in the wound bed.  Wound Description (Comments):   Present on Admission: No  Per Wound Nurse: [place] Nurse Consult Note:  Reason for Consult: sacral wound  Wound type: Pressure  Sacrum Unstageable Pressure Injury 2 cms x 1 cms 100% devitalized tissue  R lateral knee Unstageable Pressure Injury 0.5 cms x 0.5 cms 100% devitalized tissue  R medial knee Stage 2 Pressure Injury 1 cm x 1 cm x 0.1 cm 100% pink moist  R great toe full thickness 3 cms x 3 cms x 0.1 cm 50% tan devitalized tissue 50% red moist  L lateral foot Deep Tissure Pressure Injury 2 cms x 1 cm intact blister  R 5th digit dark tissue surrounding; suspect Deep Tissue Injury that  has evolved to full thickness 1.5 cms x 2 cms x 0.1 cms pink moist area  R upper posterior thigh full thickness linear wound with a dry scab, 4 cms x 0.3 cms  Pressure Injury POA: Sacral Pressure Injury not present on admission  Dressing procedure/placement/frequency:  Sacrum:  clean wound with NS, apply Medihoney to gauze and place on wound bed daily, cover with foam dressing.  R lateral knee clean with NS, apply Medihoney to gauze and place on wound bed daily, cover with foam dressing.  R medial knee Stage 2 cover with foam dressing.  R great toe full thickness clean with NS, apply Medihoney to gauze and place in wound bed daily, cover with foam dressing.  L lateral foot cover with foam dressing.  R 5th digit cover with foam dressing.  R upper posterior thigh cover with foam dressing.   All foam  dressings to be changed q 3 days and prn soiling.  POC discussed with bedside nurse and patient.  Patient to be placed on low air loss mattress for moisture management and pressure redistribution.  Prevalon boots ordered for off loading of heels.         Severe protein calorie malnutrition -Estimated body mass index is 24.48 kg/m as calculated from the following:   Height as of this encounter: 5\' 2"  (1.575 m).   Weight as of this encounter: 60.7 kg. -Nutrition Status: Nutrition Problem: Severe Malnutrition Etiology: chronic illness Signs/Symptoms: severe muscle depletion, severe fat depletion Interventions: Ensure Enlive (each supplement provides 350kcal and 20 grams of protein), MVI -Continue to encourage increased oral intake   Disposition -Per social work, patient cannot return to her previous SNF due to financial issues, they will also need to pay 30 days out from which spouse is unable to do so.   -No family to assist at home.   -Difficult placement, Medicaid application pending and patient's spouse states that the Medicaid application is currently being reviewed but has not gotten the case worker yet.   -Currently medically stable for discharge once safe discharge plan acquired. Has a bed but Medicaid application is pending   DVT prophylaxis: SCDs Start: 08/25/22 0659 apixaban (ELIQUIS) tablet 5 mg    Code Status: DNR Family Communication: Discussed with Husband at bedside   Disposition Plan:  Level of care: Med-Surg Status is: Inpatient Remains inpatient appropriate because: Medically stable for discharge once safe disposition found by social work; Bed has been secured but Medicaid Pending    Consultants:  Palliative Care (signed off as of 09/26/22)   Procedures:  As delineated as above  Antimicrobials:  Anti-infectives (From admission, onward)    Start     Dose/Rate Route Frequency Ordered Stop   09/03/22 1000  ertapenem (INVANZ) 1,000 mg in sodium chloride 0.9 % 100  mL IVPB        1 g 200 mL/hr over 30 Minutes Intravenous Every 24 hours 09/02/22 1421 09/04/22 2048   08/30/22 1500  ertapenem (INVANZ) 1,000 mg in sodium chloride 0.9 % 100 mL IVPB  Status:  Discontinued        1 g 200 mL/hr over 30 Minutes Intravenous Every 24 hours 08/30/22 1419 09/02/22 1421   08/29/22 1115  cefTRIAXone (ROCEPHIN) 1 g in sodium chloride 0.9 % 100 mL IVPB  Status:  Discontinued        1 g 200 mL/hr over 30 Minutes Intravenous Every 24 hours 08/29/22 1017 08/30/22 1419       Subjective: Seen and examined  at bedside and she is doing okay.  Husband states that she is eating today.  Sodium is elevated and potassium is on the low side today.  She had no complaints or concerns and husband had no other questions.  Per TOC she has a bed available for discharge but no Medicaid approval.  Objective: Vitals:   09/28/22 1601 09/28/22 2132 09/29/22 0443 09/29/22 1006  BP: 104/68 124/85 101/65 101/67  Pulse: (!) 107 (!) 106 (!) 101 (!) 102  Resp: 16 20 18 17   Temp: 99.6 F (37.6 C) 97.8 F (36.6 C) 98 F (36.7 C) 98.4 F (36.9 C)  TempSrc: Axillary Oral Oral Oral  SpO2: 100% 98% 99% 96%  Weight:      Height:       No intake or output data in the 24 hours ending 09/29/22 1311 Filed Weights   08/30/22 1300 09/24/22 0526  Weight: 50.2 kg 60.7 kg   Examination: Physical Exam:  Constitutional: Patient is a thin cachectic chronically ill-appearing AAF in NAD Respiratory: Diminished to auscultation bilaterally, no wheezing, rales, rhonchi or crackles. Normal respiratory effort and patient is not tachypenic. Unlabored breathing   Cardiovascular: RRR, no murmurs / rubs / gallops. S1 and S2 auscultated. No extremity edema. Abdomen: Soft, non-tender, non-distended. Bowel sounds positive.  GU: Deferred. Musculoskeletal: No clubbing / cyanosis of digits/nails. No joint deformity upper and lower extremities.  Skin: Has multiple ulcers and wounds on the lower  Neurologic: CN 2-12  grossly intact with no focal deficits. Romberg sign cerebellar reflexes not assessed.  Psychiatric: Impaired judgement   Data Reviewed: I have personally reviewed following labs and imaging studies  CBC: Recent Labs  Lab 09/24/22 1159 09/27/22 0136 09/29/22 0303  WBC 9.9 9.8 10.5  NEUTROABS 8.2* 8.2*  --   HGB 8.1* 8.1* 8.6*  HCT 26.5* 26.5* 27.6*  MCV 77.3* 76.1* 75.6*  PLT 631* 593* 593*   Basic Metabolic Panel: Recent Labs  Lab 09/24/22 1159 09/27/22 0136 09/29/22 0303  NA 139 141 147*  K 3.5 3.7 3.2*  CL 103 106 110  CO2 26 26 28   GLUCOSE 101* 129* 103*  BUN 19 22 25*  CREATININE 0.64 0.59 0.58  CALCIUM 8.3* 8.5* 8.6*  MG 2.0 2.0 2.4  PHOS 3.0 2.2* 3.6   GFR: Estimated Creatinine Clearance: 48.9 mL/min (by C-G formula based on SCr of 0.58 mg/dL). Liver Function Tests: Recent Labs  Lab 09/24/22 1159 09/27/22 0136  AST 21 31  ALT 25 25  ALKPHOS 68 76  BILITOT 0.4 0.3  PROT 6.3* 6.4*  ALBUMIN 2.0* 2.0*   No results for input(s): "LIPASE", "AMYLASE" in the last 168 hours. No results for input(s): "AMMONIA" in the last 168 hours. Coagulation Profile: No results for input(s): "INR", "PROTIME" in the last 168 hours. Cardiac Enzymes: No results for input(s): "CKTOTAL", "CKMB", "CKMBINDEX", "TROPONINI" in the last 168 hours. BNP (last 3 results) No results for input(s): "PROBNP" in the last 8760 hours. HbA1C: No results for input(s): "HGBA1C" in the last 72 hours. CBG: No results for input(s): "GLUCAP" in the last 168 hours. Lipid Profile: No results for input(s): "CHOL", "HDL", "LDLCALC", "TRIG", "CHOLHDL", "LDLDIRECT" in the last 72 hours. Thyroid Function Tests: No results for input(s): "TSH", "T4TOTAL", "FREET4", "T3FREE", "THYROIDAB" in the last 72 hours. Anemia Panel: No results for input(s): "VITAMINB12", "FOLATE", "FERRITIN", "TIBC", "IRON", "RETICCTPCT" in the last 72 hours. Sepsis Labs: No results for input(s): "PROCALCITON", "LATICACIDVEN" in  the last 168 hours.  No results found for  this or any previous visit (from the past 240 hour(s)).   Radiology Studies: No results found.  Scheduled Meds:  apixaban  5 mg Oral BID   feeding supplement  237 mL Oral TID BM   ferrous sulfate  325 mg Oral QODAY   flecainide  50 mg Oral BID   leptospermum manuka honey  1 Application Topical Daily   multivitamin with minerals  1 tablet Oral Daily   pantoprazole  40 mg Oral Daily   potassium chloride  40 mEq Oral BID   Continuous Infusions:  dextrose 75 mL/hr at 09/29/22 1021   potassium chloride 10 mEq (09/29/22 1308)    LOS: 34 days   Marguerita Merles, DO [facility] Available via Epic secure chat 7am-7pm After these hours, please refer to coverage provider listed on [email or web address] 09/29/2022, 1:11 PM

## 2022-09-29 NOTE — Plan of Care (Signed)

## 2022-09-29 NOTE — TOC Progression Note (Signed)
Transition of Care [facility]) - Progression Note    Patient Details  Name: SARAFINA PUTHOFF MRN: [id number] Date of Birth: 30-Jan-1941  Transition of Care [facility]) CM/SW [place], [place] Phone Number: 09/29/2022, 3:03 PM  Clinical Narrative:    CSW spoke with pt's spouse to get an update on Medicaid application. Per spouse, he has not heard back yet, but will let CSW know when he does. [facility] has declined, [facility] is reviewing, currrent offer from [employer]. CSW updated TOC leadership to need for LOG soon. TOC will continue to follow for DC needs.        Expected Discharge Plan and Services                                               Social Determinants of Health (SDOH) Interventions SDOH Screenings   Food Insecurity: Unknown (07/03/2022)  Alcohol Screen: Low Risk  (04/12/2019)  Depression (PHQ2-9): Low Risk  (11/05/2020)  Tobacco Use: Low Risk  (08/20/2022)    Readmission Risk Interventions     No data to display

## 2022-09-30 DIAGNOSIS — N179 Acute kidney failure, unspecified: Secondary | ICD-10-CM | POA: Diagnosis not present

## 2022-09-30 DIAGNOSIS — G309 Alzheimer's disease, unspecified: Secondary | ICD-10-CM | POA: Diagnosis not present

## 2022-09-30 DIAGNOSIS — K219 Gastro-esophageal reflux disease without esophagitis: Secondary | ICD-10-CM | POA: Diagnosis not present

## 2022-09-30 DIAGNOSIS — E870 Hyperosmolality and hypernatremia: Secondary | ICD-10-CM | POA: Diagnosis not present

## 2022-09-30 LAB — PHOSPHORUS: Phosphorus: 3.4 mg/dL (ref 2.5–4.6)

## 2022-09-30 LAB — BASIC METABOLIC PANEL
Anion gap: 7 (ref 5–15)
BUN: 15 mg/dL (ref 8–23)
CO2: 25 mmol/L (ref 22–32)
Calcium: 8.6 mg/dL — ABNORMAL LOW (ref 8.9–10.3)
Chloride: 106 mmol/L (ref 98–111)
Creatinine, Ser: 0.5 mg/dL (ref 0.44–1.00)
GFR, Estimated: >60 mL/min (ref >60)
Glucose, Bld: 106 mg/dL — ABNORMAL HIGH (ref 70–99)
Potassium: 4.2 mmol/L (ref 3.5–5.1)
Sodium: 138 mmol/L (ref 135–145)

## 2022-09-30 NOTE — Plan of Care (Signed)

## 2022-09-30 NOTE — Consult Note (Signed)
[place] Nurse wound follow up Wound type: Pressure Measurement: 1.  Sacrum remains Unstageable Pressure Injury 2 cms x 1 cms 100% devitalized tissue  2.  R lateral knee Unstageable Pressure Injury has evolved to Stage 2, 0.3 cms x 0.2 cms x 0.1 cms 100% pink and moist  3.  R medial knee Stage 2 1.5 cms x 1 cm x 0.1 cm, pink and moist  4.  R great toe full thickness 3 cms x 3 cms x 0.1 cms 50% tan devitalized tissue, 50% red moist 5.  L lateral foot Deep Tissue Pressure Injury 2 cms x 1 cm intact blister 6.  R 5th digit dark tissue surrounding;  suspect Deep Tissue Injury that has evolved to full thickness 1.5 cms x 2 cms x 0.1 cms pink  moist  7.  R hip Deep Tissue Pressure Injury that has evolved to Stage 3 Pressure Injury 2 cms x 1 cms x 0.1 cms , 80% red moist 20% yellow devitalized tissue  Drainage (amount, consistency, odor) Scant serosanguinous Sacrum, R lateral knee, R medial knee, R great toe Periwound: old scar tissue noted surrounding sacral wound, otherwise intact  Dressing procedure/placement/frequency:  Continue with Medihoney to Sacrum and R great toe.  2.   R hip apply foam dressing.  3.  Continue with foam dressings to other areas.  All foam dressings to be changed q3 days and prn soiling.   Low air loss mattress ordered for patient for moisture management and pressure redistribution. Prevalon boots ordered for off loading of heels.    WOC will assess this patient weekly to determine changes in plan of care.   Thank you,    Champagne Paletta MSN, RN-BC, [employer]

## 2022-09-30 NOTE — Progress Notes (Signed)
PROGRESS NOTE    Donna Simmons  ZOX:[id number] DOB: 1940-12-05 DOA: 08/25/2022 PCP: [name], DO   Brief Narrative:  Donna Simmons is a 82 y.o. female with past medical history significant for Alzheimer's dementia, HTN, HLD, permanent atrial fibrillation on Eliquis who presented to [facility] ED on 12/25 from [facility] for failure to thrive.  Patient with decreased oral intake over the past 3 days with increased lethargy.  EMS was activated and patient was brought to the ED for further evaluation.  Workup revealed ESBL UTI and patient completed antibiotic course with IV ertapenem. She improved and is currently medically stable and awaiting SNF placement she is a difficult placement.     TOC is involved and following up on Medicaid application status and per TOC is currently being reviewed by DSS but the patient has not gotten the caseworker assigned yet. Caseworker following up with several facilities to start reviewing her for LTC placement and requesting the facility to determine if they can take an LOG.     **09/27/22: Labs are stable. She didn't reatlly want to speak to me today. No acute issues overnight    **09/28/22: Blood pressure is on the lower side this morning and patient was not eating as well so we will give her 500 mL bolus.  After her husband came she started eating better and all her meal.  Blood pressure is now improved after the bolus.  Will need to continue to monitor however she still remains medically stable for discharge.   **09/29/22: Na+ is elevated again and will start D5W and will replete Potassium   **09/30/22: Patient is improved so we will discontinue D5W.  Potassium is now improved.  Assessment and Plan:  ESBL E. coli UTI -Completed 5-day course of ertapenem.   Hypernatremia -Presented with a sodium level of 167, likely in the setting of poor oral intake/dehydration from hypovolemia.   -IVF with D5W now stopped that N+ has improved -Continue to encourage  increased oral intake.  -Unfortunately, hyponatremia confers a very poor prognosis in the setting of dementia and palliative care was consulted; however family not ready for hospice at this time.   -Na+ Trend: Recent Labs  Lab 09/15/22 0308 09/17/22 0325 09/22/22 0239 09/24/22 1159 09/27/22 0136 09/29/22 0303 09/30/22 0441  NA 138 138 140 139 141 147* 138  -Continue intermittent BMPs    Thrombocytosis -Etiology in the setting of active infection as above.   -Had resolved but now worsened as Plt Count Trend: Recent Labs  Lab 09/08/22 0243 09/10/22 0217 09/15/22 0308 09/22/22 0239 09/24/22 1159 09/27/22 0136 09/29/22 0303  PLT 336 358 503* 643* 631* 593* 593*  -Continue to Monitor and Trend Intermittently and continue to Monitor for S/Sx of Bleeding -Stable from last check    Hypokalemia -K+ Trend Recent Labs  Lab 09/15/22 0308 09/17/22 0325 09/22/22 0239 09/24/22 1159 09/27/22 0136 09/29/22 0303 09/30/22 0441  K 3.9 3.5 3.8 3.5 3.7 3.2* 4.2  -Replete as Necessary  -Continue to Monitor and Trend K+ Intermittently   Hypophosphatemia -Phos Level Trend: Recent Labs  Lab 09/01/22 1805 09/02/22 0246 09/02/22 1059 09/24/22 1159 09/27/22 0136 09/29/22 0303 09/30/22 0441  PHOS 3.0 3.0 3.1 3.0 2.2* 3.6 3.4  -Replete with po K Phos Neutral 500 mg po BID x2 -Continue to Monitor and Replete as Necessary -Repeat CMP intermittently    Acute renal failure: Resolved -Presented with a creatinine 1.1, baseline 0.7. -Etiology likely secondary to severe dehydration, now resolved following  IV fluid hydration.  -BUN/Cr Trend: Recent Labs  Lab 09/15/22 0308 09/17/22 0325 09/22/22 0239 09/24/22 1159 09/27/22 0136 09/29/22 0303 09/30/22 0441  BUN 15 10 20 19 22  25* 15  CREATININE 0.56 0.59 0.53 0.64 0.59 0.58 0.50  -Avoid Nephrotoxic Medications, Contrast Dyes, Hypotension and Dehydration to Ensure Adequate Renal Perfusion and will need to Renally Adjust  Meds -Continue to Monitor and Trend Renal Function carefully and repeat CMP intermittently    Transaminitis -Improved with IV fluid hydration. -LFT Trend: Recent Labs  Lab 09/04/22 0256 09/10/22 0217 09/15/22 0308 09/17/22 0325 09/24/22 1159 09/27/22 0136  AST 22 26 44* 11* 21 31  ALT 21 22 35 21 25 25   -Continue to Monitor and Tend CMP intermittently    Hypoalbuminemia -In the setting of poor oral intake, underlying dementia.   -Continue to encourage increased oral intake. -Albumin Level Trend: Recent Labs  Lab 09/02/22 1059 09/04/22 0256 09/10/22 0217 09/15/22 0308 09/17/22 0325 09/24/22 1159 09/27/22 0136  ALBUMIN 2.4* 2.2* 2.2* 2.4* 2.0* 2.0* 2.0*  -Continue to Monitor and Trend CMP Intermittently     Iron Deficiency Anemia -Hgb/Hct Trend Recent Labs  Lab 09/08/22 0243 09/10/22 0217 09/15/22 0308 09/22/22 0239 09/24/22 1159 09/27/22 0136 09/29/22 0303  HGB 8.9* 8.7* 8.9* 8.8* 8.1* 8.1* 8.6*  HCT 27.5* 27.2* 28.3* 29.7* 26.5* 26.5* 27.6*  MCV 77.5* 77.3* 77.5* 79.6* 77.3* 76.1* 75.6*  -Continue to monitor for signs or symptoms of bleeding; no overt bleeding noted -Repeat CBC intermittently and will repeat in the morning   Permanent Atrial Fibrillation -C/w Flecainide 50 mg p.o. twice daily -C/w Apixaban 5 mg p.o. twice daily -Was Holding home metoprolol in the setting of soft blood pressures but may need to resume if blood pressure is improved -Heart rates are slightly elevated so she was given a 500 mL bolus a few days ago and was placed on maintenance IV fluid with D5W but this is now stopped   Insomnia -C/w Melatonin 5 mg p.o. nightly as needed insomnia   Alzheimer's Dementia -C/w Delirium precautions -Get up during the day -Encourage a familiar face to remain present throughout the day -Keep blinds open and lights on during daylight hours -Minimize the use of opioids/benzodiazepines -Evaluated by palliative care, family not ready for patient to  enroll in hospice services yet.   Pressure Ulcers, not present on Admission Pressure Injury 09/04/22 Sacrum Unstageable - Full thickness tissue loss in which the base of the injury is covered by slough (yellow, tan, gray, green or brown) and/or eschar (tan, brown or black) in the wound bed. (Active)  09/04/22 1030  Location: Sacrum  Location Orientation:   Staging: Unstageable - Full thickness tissue loss in which the base of the injury is covered by slough (yellow, tan, gray, green or brown) and/or eschar (tan, brown or black) in the wound bed.  Wound Description (Comments):   Present on Admission: No     Pressure Injury 09/30/22 Hip Right Deep Tissue Pressure Injury - Purple or maroon localized area of discolored intact skin or blood-filled blister due to damage of underlying soft tissue from pressure and/or shear. 2 cms x 1 cm x 0.1 cms 80% red moist 20 (Active)  09/30/22 1200  Location: Hip  Location Orientation: Right  Staging: Deep Tissue Pressure Injury - Purple or maroon localized area of discolored intact skin or blood-filled blister due to damage of underlying soft tissue from pressure and/or shear.  Wound Description (Comments): 2 cms x 1 cm x 0.1  cms 80% red moist 20% yellow  Present on Admission: No  Per Wound Nurse: "Wound type: Pressure Measurement: 1.  Sacrum remains Unstageable Pressure Injury 2 cms x 1 cms 100% devitalized tissue  2.  R lateral knee Unstageable Pressure Injury has evolved to Stage 2, 0.3 cms x 0.2 cms x 0.1 cms 100% pink and moist  3.  R medial knee Stage 2 1.5 cms x 1 cm x 0.1 cm, pink and moist  4.  R great toe full thickness 3 cms x 3 cms x 0.1 cms 50% tan devitalized tissue, 50% red moist 5.  L lateral foot Deep Tissue Pressure Injury 2 cms x 1 cm intact blister 6.  R 5th digit dark tissue surrounding;  suspect Deep Tissue Injury that has evolved to full thickness 1.5 cms x 2 cms x 0.1 cms pink  moist  7.  R hip Deep Tissue Pressure Injury that has  evolved to Stage 3 Pressure Injury 2 cms x 1 cms x 0.1 cms , 80% red moist 20% yellow devitalized tissue  Drainage (amount, consistency, odor) Scant serosanguinous Sacrum, R lateral knee, R medial knee, R great toe Periwound: old scar tissue noted surrounding sacral wound, otherwise intact  Dressing procedure/placement/frequency:  Continue with Medihoney to Sacrum and R great toe.  2.   R hip apply foam dressing.  3.  Continue with foam dressings to other areas.  All foam dressings to be changed q3 days and prn soiling.  Low air loss mattress ordered for patient for moisture management and pressure redistribution. Prevalon boots ordered for off loading of heels."    Severe protein calorie malnutrition -Estimated body mass index is 24.48 kg/m as calculated from the following:   Height as of this encounter: 5\' 2"  (1.575 m).   Weight as of this encounter: 60.7 kg. -Nutrition Status: Nutrition Problem: Severe Malnutrition Etiology: chronic illness Signs/Symptoms: severe muscle depletion, severe fat depletion Interventions: Ensure Enlive (each supplement provides 350kcal and 20 grams of protein), MVI -Continue to encourage increased oral intake   Disposition -Per social work, patient cannot return to her previous SNF due to financial issues, they will also need to pay 30 days out from which spouse is unable to do so.   -No family to assist at home.   -Difficult placement, Medicaid application pending and patient's spouse states that the Medicaid application is currently being reviewed but has not gotten the case worker yet.   -Currently medically stable for discharge once safe discharge plan acquired. Has a bed but Medicaid application is pending    DVT prophylaxis: SCDs Start: 08/25/22 0659 apixaban (ELIQUIS) tablet 5 mg    Code Status: DNR Family Communication: No family currently at bedside  Disposition Plan:  Level of care: Med-Surg Status is: Inpatient Remains inpatient appropriate  because: She is stable however cannot discharge due to Medicaid application is still pending   Consultants:  Palliative Care (signed off as of 09/26/22)   Procedures:  As delineated as above  Antimicrobials:  Anti-infectives (From admission, onward)    Start     Dose/Rate Route Frequency Ordered Stop   09/03/22 1000  ertapenem (INVANZ) 1,000 mg in sodium chloride 0.9 % 100 mL IVPB        1 g 200 mL/hr over 30 Minutes Intravenous Every 24 hours 09/02/22 1421 09/04/22 2048   08/30/22 1500  ertapenem (INVANZ) 1,000 mg in sodium chloride 0.9 % 100 mL IVPB  Status:  Discontinued  1 g 200 mL/hr over 30 Minutes Intravenous Every 24 hours 08/30/22 1419 09/02/22 1421   08/29/22 1115  cefTRIAXone (ROCEPHIN) 1 g in sodium chloride 0.9 % 100 mL IVPB  Status:  Discontinued        1 g 200 mL/hr over 30 Minutes Intravenous Every 24 hours 08/29/22 1017 08/30/22 1419       Subjective: Seen and examined at bedside she is being fed by the nurse.  Had no complaints.  Remained stable for discharge and sodium is improved so we will stop D5W.  No family currently at bedside.  No other concerns or close at this time.  Objective: Vitals:   09/29/22 1628 09/29/22 2049 09/30/22 0439 09/30/22 0801  BP: 96/66 103/66 102/63 112/66  Pulse: 96 98 93 95  Resp: 17 18 16 16   Temp: 97.9 F (36.6 C) 98.2 F (36.8 C) 98 F (36.7 C) 98.6 F (37 C)  TempSrc: Oral Oral Oral Oral  SpO2: 99% 97% 98% 100%  Weight:      Height:        Intake/Output Summary (Last 24 hours) at 09/30/2022 1350 Last data filed at 09/30/2022 10/02/2022 Gross per 24 hour  Intake 1542.2 ml  Output --  Net 1542.2 ml   Filed Weights   08/30/22 1300 09/24/22 0526  Weight: 50.2 kg 60.7 kg   Examination: Physical Exam:  Constitutional: Patient is a thin cachectic chronically ill-appearing African-American female in no acute distress Respiratory: Diminished to auscultation bilaterally, no wheezing, rales, rhonchi or crackles. Normal  respiratory effort and patient is not tachypenic. No accessory muscle use. Unlabored breathing   Cardiovascular: RRR, no murmurs / rubs / gallops. S1 and S2 auscultated. No extremity edema.  Abdomen: Soft, non-tender, non-distended. Bowel sounds positive.  GU: Deferred. Musculoskeletal: No clubbing / cyanosis of digits/nails. No joint deformity upper and lower extremities.  Skin: Multiple wounds/ulcers in the lower extremities noted that are covered Neurologic: CN 2-12 grossly intact with no focal deficits. Romberg sign cerebellar reflexes not assessed.  Psychiatric: Impaired judgment and insight  Data Reviewed: I have personally reviewed following labs and imaging studies  CBC: Recent Labs  Lab 09/24/22 1159 09/27/22 0136 09/29/22 0303  WBC 9.9 9.8 10.5  NEUTROABS 8.2* 8.2*  --   HGB 8.1* 8.1* 8.6*  HCT 26.5* 26.5* 27.6*  MCV 77.3* 76.1* 75.6*  PLT 631* 593* 593*   Basic Metabolic Panel: Recent Labs  Lab 09/24/22 1159 09/27/22 0136 09/29/22 0303 09/30/22 0441  NA 139 141 147* 138  K 3.5 3.7 3.2* 4.2  CL 103 106 110 106  CO2 26 26 28 25   GLUCOSE 101* 129* 103* 106*  BUN 19 22 25* 15  CREATININE 0.64 0.59 0.58 0.50  CALCIUM 8.3* 8.5* 8.6* 8.6*  MG 2.0 2.0 2.4  --   PHOS 3.0 2.2* 3.6 3.4   GFR: Estimated Creatinine Clearance: 48.9 mL/min (by C-G formula based on SCr of 0.5 mg/dL). Liver Function Tests: Recent Labs  Lab 09/24/22 1159 09/27/22 0136  AST 21 31  ALT 25 25  ALKPHOS 68 76  BILITOT 0.4 0.3  PROT 6.3* 6.4*  ALBUMIN 2.0* 2.0*   No results for input(s): "LIPASE", "AMYLASE" in the last 168 hours. No results for input(s): "AMMONIA" in the last 168 hours. Coagulation Profile: No results for input(s): "INR", "PROTIME" in the last 168 hours. Cardiac Enzymes: No results for input(s): "CKTOTAL", "CKMB", "CKMBINDEX", "TROPONINI" in the last 168 hours. BNP (last 3 results) No results for input(s): "PROBNP" in  the last 8760 hours. HbA1C: No results for  input(s): "HGBA1C" in the last 72 hours. CBG: No results for input(s): "GLUCAP" in the last 168 hours. Lipid Profile: No results for input(s): "CHOL", "HDL", "LDLCALC", "TRIG", "CHOLHDL", "LDLDIRECT" in the last 72 hours. Thyroid Function Tests: No results for input(s): "TSH", "T4TOTAL", "FREET4", "T3FREE", "THYROIDAB" in the last 72 hours. Anemia Panel: No results for input(s): "VITAMINB12", "FOLATE", "FERRITIN", "TIBC", "IRON", "RETICCTPCT" in the last 72 hours. Sepsis Labs: No results for input(s): "PROCALCITON", "LATICACIDVEN" in the last 168 hours.  No results found for this or any previous visit (from the past 240 hour(s)).   Radiology Studies: No results found.  Scheduled Meds:  apixaban  5 mg Oral BID   feeding supplement  237 mL Oral TID BM   ferrous sulfate  325 mg Oral QODAY   flecainide  50 mg Oral BID   leptospermum manuka honey  1 Application Topical Daily   multivitamin with minerals  1 tablet Oral Daily   pantoprazole  40 mg Oral Daily   Continuous Infusions:   LOS: 35 days   Raiford Noble, DO [facility] Available via Epic secure chat 7am-7pm After these hours, please refer to coverage provider listed on [email or web address] 09/30/2022, 1:50 PM

## 2022-09-30 NOTE — Progress Notes (Signed)
Nutrition Follow-up  DOCUMENTATION CODES:   Severe malnutrition in context of chronic illness  INTERVENTION:  Continue Ensure Enlive po TID, each supplement provides 350 kcal and 20 grams of protein. Continue meal feeding with assistance Continue MVI with minerals daily  NUTRITION DIAGNOSIS:  Severe Malnutrition related to chronic illness as evidenced by severe muscle depletion, severe fat depletion. - Ongoing  GOAL:  Patient will meet greater than or equal to 90% of their needs - Ongoing  MONITOR:  PO intake, Supplement acceptance, Weight trends, Labs  REASON FOR ASSESSMENT:  Consult Assessment of nutrition requirement/status  ASSESSMENT:  82 y.o. female presented to the ED with failure to thrive from SNF, recent decreased PO intake and lethargy. PMH includes Alzheimer's dementia, GERD, HTN, and A. Fib. Pt admitted with hypernatremia, AKI, dehydration, and hypokalemia.  Pt laying in bed, lunch observed in room (untouched). Pt woke briefly to RD voice/touch. Is able to say she is doing well. Does not engage in any other conversation with RD.  Discussed with RN. RN reports that she fed her breakfast and ate some eggs, grits, orange juice, and some Ensure.   Pt remains medically stable for discharge, still pending placement.   Medications reviewed and include: Ferrous Sulfate, MVI, Protonix Labs reviewed.  Diet Order:   Diet Order             DIET - DYS 1 Room service appropriate? Yes; Fluid consistency: Thin  Diet effective now                   EDUCATION NEEDS:  Not appropriate for education at this time  Skin:  Per WOC assessment on 09/30/22: 1.  Sacrum Unstageable Pressure Injury 2 cms x 1 cms 100% devitalized tissue  2.  R lateral knee Unstageable Pressure Injury has evolved to Stage 2, 0.3 cms x 0.2 cms x 0.1 cms 100% pink and moist  3.  R medial knee Stage 2 1.5 cms x 1 cm x 0.1 cm, pink and moist  4.  R great toe full thickness 3 cms x 3 cms x 0.1 cms 50%  tan devitalized tissue, 50% red moist 5.  L lateral foot Deep Tissue Pressure Injury 2 cms x 1 cm intact blister 6.  R 5th digit dark tissue surrounding;  suspect Deep Tissue Injury that has evolved to full thickness 1.5 cms x 2 cms x 0.1 cms pink  moist  7.  R hip Deep Tissue Pressure Injury that has evolved to Stage 3 Pressure Injury 2 cms x 1 cms x 0.1 cms , 80% red moist 20% yellow devitalized tissue  Last BM:  1/30  Height:  Ht Readings from Last 1 Encounters:  08/30/22 5\' 2"  (1.575 m)   Weight:  Wt Readings from Last 1 Encounters:  09/24/22 60.7 kg   Ideal Body Weight:  50 kg  BMI:  Body mass index is 24.48 kg/m.  Estimated Nutritional Needs:  Kcal:  1500-1700 Protein:  75-90 grams Fluid:  >/= 1.5 L   Hermina Barters RD, LDN Clinical Dietitian See [facility] for contact information.

## 2022-09-30 NOTE — Progress Notes (Signed)
Nursing note: Air bed ordered for [phone] but none available.

## 2022-10-01 DIAGNOSIS — N179 Acute kidney failure, unspecified: Secondary | ICD-10-CM | POA: Diagnosis not present

## 2022-10-01 DIAGNOSIS — E870 Hyperosmolality and hypernatremia: Secondary | ICD-10-CM | POA: Diagnosis not present

## 2022-10-01 DIAGNOSIS — G309 Alzheimer's disease, unspecified: Secondary | ICD-10-CM | POA: Diagnosis not present

## 2022-10-01 DIAGNOSIS — K219 Gastro-esophageal reflux disease without esophagitis: Secondary | ICD-10-CM | POA: Diagnosis not present

## 2022-10-01 LAB — CBC WITH DIFFERENTIAL/PLATELET
Abs Immature Granulocytes: 0.05 10*3/uL (ref 0.00–0.07)
Basophils Absolute: 0 10*3/uL (ref 0.0–0.1)
Basophils Relative: 0 %
Eosinophils Absolute: 0.1 10*3/uL (ref 0.0–0.5)
Eosinophils Relative: 1 %
HCT: 26 % — ABNORMAL LOW (ref 36.0–46.0)
Hemoglobin: 8.2 g/dL — ABNORMAL LOW (ref 12.0–15.0)
Immature Granulocytes: 1 %
Lymphocytes Relative: 14 %
Lymphs Abs: 1.1 10*3/uL (ref 0.7–4.0)
MCH: 23.4 pg — ABNORMAL LOW (ref 26.0–34.0)
MCHC: 31.5 g/dL (ref 30.0–36.0)
MCV: 74.1 fL — ABNORMAL LOW (ref 80.0–100.0)
Monocytes Absolute: 0.4 10*3/uL (ref 0.1–1.0)
Monocytes Relative: 5 %
Neutro Abs: 6.4 10*3/uL (ref 1.7–7.7)
Neutrophils Relative %: 79 %
Platelets: 501 10*3/uL — ABNORMAL HIGH (ref 150–400)
RBC: 3.51 MIL/uL — ABNORMAL LOW (ref 3.87–5.11)
RDW: 17.9 % — ABNORMAL HIGH (ref 11.5–15.5)
WBC: 8 10*3/uL (ref 4.0–10.5)
nRBC: 0 % (ref 0.0–0.2)

## 2022-10-01 LAB — COMPREHENSIVE METABOLIC PANEL
ALT: 32 U/L (ref 0–44)
AST: 31 U/L (ref 15–41)
Albumin: 1.9 g/dL — ABNORMAL LOW (ref 3.5–5.0)
Alkaline Phosphatase: 70 U/L (ref 38–126)
Anion gap: 9 (ref 5–15)
BUN: 13 mg/dL (ref 8–23)
CO2: 27 mmol/L (ref 22–32)
Calcium: 8.5 mg/dL — ABNORMAL LOW (ref 8.9–10.3)
Chloride: 102 mmol/L (ref 98–111)
Creatinine, Ser: 0.49 mg/dL (ref 0.44–1.00)
GFR, Estimated: >60 mL/min (ref >60)
Glucose, Bld: 90 mg/dL (ref 70–99)
Potassium: 3.5 mmol/L (ref 3.5–5.1)
Sodium: 138 mmol/L (ref 135–145)
Total Bilirubin: 0.4 mg/dL (ref 0.3–1.2)
Total Protein: 5.8 g/dL — ABNORMAL LOW (ref 6.5–8.1)

## 2022-10-01 LAB — MAGNESIUM: Magnesium: 2 mg/dL (ref 1.7–2.4)

## 2022-10-01 LAB — PHOSPHORUS: Phosphorus: 4 mg/dL (ref 2.5–4.6)

## 2022-10-01 NOTE — Progress Notes (Signed)
[facility]  PROGRESS NOTE  Donna Simmons BJS:[id number] DOB: November 22, 1940 DOA: 08/25/2022 PCP: [name], DO   Brief HPI:   82 year old female with medical history of Alzheimer's dementia, hypertension, hyperlipidemia, permanent atrial fibrillation on Eliquis came to [name] ED on 12/25 from [place] facility for failure to thrive.  Patient has decreased oral intake for past 3 days and increased lethargy.  Workup revealed ESBL UTI and patient completed antibiotic course with IV ertapenem.  Patient improved and is currently awaiting placement.    Subjective   Patient seen and examined, denies any complaints.   Assessment/Plan:    ESBL E. coli UTI -Completed 5 days course of ertapenem  Hypernatremia -Presented with sodium of 167 due to poor p.o. intake -Started on IV fluids with D5W -Sodium has improved to 138  Acute kidney injury -Resolved -Presented with creatinine of 1.1, baseline 0.7 -Likely from CKD rotation -Creatinine has improved to 0.49  Iron deficiency anemia -Hemoglobin stable, continue supplementation with ferrous sulfate  Permanent atrial fibrillation -Continue flecainide, apixaban -Metoprolol on hold due to soft blood pressure  Alzheimer's dementia -Continue delirium precautions  Hypokalemia -Replete  Hypophosphatemia -Replete    Medications     apixaban  5 mg Oral BID   feeding supplement  237 mL Oral TID BM   ferrous sulfate  325 mg Oral QODAY   flecainide  50 mg Oral BID   leptospermum manuka honey  1 Application Topical Daily   multivitamin with minerals  1 tablet Oral Daily   pantoprazole  40 mg Oral Daily     Data Reviewed:   CBG:  No results for input(s): "GLUCAP" in the last 168 hours.  SpO2: 98 % O2 Flow Rate (L/min): 2 L/min    Vitals:   10/01/22 0500 10/01/22 0506 10/01/22 0754 10/01/22 1541  BP:  104/63 101/66 102/65  Pulse:  86 80 83  Resp:   18 18  Temp:  97.6 F (36.4 C) 98.2 F  (36.8 C) 98.1 F (36.7 C)  TempSrc:  Oral Oral Oral  SpO2:  98% 98% 98%  Weight: 68.1 kg     Height:          Data Reviewed:  Basic Metabolic Panel: Recent Labs  Lab 09/27/22 0136 09/29/22 0303 09/30/22 0441 10/01/22 0248  NA 141 147* 138 138  K 3.7 3.2* 4.2 3.5  CL 106 110 106 102  CO2 26 28 25 27   GLUCOSE 129* 103* 106* 90  BUN 22 25* 15 13  CREATININE 0.59 0.58 0.50 0.49  CALCIUM 8.5* 8.6* 8.6* 8.5*  MG 2.0 2.4  --  2.0  PHOS 2.2* 3.6 3.4 4.0    CBC: Recent Labs  Lab 09/27/22 0136 09/29/22 0303 10/01/22 0248  WBC 9.8 10.5 8.0  NEUTROABS 8.2*  --  6.4  HGB 8.1* 8.6* 8.2*  HCT 26.5* 27.6* 26.0*  MCV 76.1* 75.6* 74.1*  PLT 593* 593* 501*    LFT Recent Labs  Lab 09/27/22 0136 10/01/22 0248  AST 31 31  ALT 25 32  ALKPHOS 76 70  BILITOT 0.3 0.4  PROT 6.4* 5.8*  ALBUMIN 2.0* 1.9*     Antibiotics: Anti-infectives (From admission, onward)    Start     Dose/Rate Route Frequency Ordered Stop   09/03/22 1000  ertapenem (INVANZ) 1,000 mg in sodium chloride 0.9 % 100 mL IVPB        1 g 200 mL/hr over 30 Minutes Intravenous Every 24 hours 09/02/22 1421 09/04/22  2048   08/30/22 1500  ertapenem (INVANZ) 1,000 mg in sodium chloride 0.9 % 100 mL IVPB  Status:  Discontinued        1 g 200 mL/hr over 30 Minutes Intravenous Every 24 hours 08/30/22 1419 09/02/22 1421   08/29/22 1115  cefTRIAXone (ROCEPHIN) 1 g in sodium chloride 0.9 % 100 mL IVPB  Status:  Discontinued        1 g 200 mL/hr over 30 Minutes Intravenous Every 24 hours 08/29/22 1017 08/30/22 1419        DVT prophylaxis: SCDs  Code Status: DNR  Family Communication: No family at bedside   CONSULTS    Objective    Physical Examination:   General-appears in no acute distress Heart-S1-S2, regular, no murmur auscultated Lungs-clear to auscultation bilaterally, no wheezing or crackles auscultated Abdomen-soft, nontender, no organomegaly Extremities-no edema in the lower  extremities Neuro-alert, oriented x3, no focal deficit noted Status is: Inpatient:      Pressure Injury 09/04/22 Sacrum Unstageable - Full thickness tissue loss in which the base of the injury is covered by slough (yellow, tan, gray, green or brown) and/or eschar (tan, brown or black) in the wound bed. (Active)  09/04/22 1030  Location: Sacrum  Location Orientation:   Staging: Unstageable - Full thickness tissue loss in which the base of the injury is covered by slough (yellow, tan, gray, green or brown) and/or eschar (tan, brown or black) in the wound bed.  Wound Description (Comments):   Present on Admission: No     Pressure Injury 09/30/22 Hip Right Deep Tissue Pressure Injury - Purple or maroon localized area of discolored intact skin or blood-filled blister due to damage of underlying soft tissue from pressure and/or shear. 2 cms x 1 cm x 0.1 cms 80% red moist 20 (Active)  09/30/22 1200  Location: Hip  Location Orientation: Right  Staging: Deep Tissue Pressure Injury - Purple or maroon localized area of discolored intact skin or blood-filled blister due to damage of underlying soft tissue from pressure and/or shear.  Wound Description (Comments): 2 cms x 1 cm x 0.1 cms 80% red moist 20% yellow  Present on Admission: No        [place]   [facility] If 7PM-7AM, please contact night-coverage at [email or web address], Office  [phone]   10/01/2022, 5:12 PM  LOS: 36 days

## 2022-10-01 NOTE — TOC Progression Note (Signed)
Transition of Care [facility]) - Progression Note    Patient Details  Name: Donna Simmons MRN: [id number] Date of Birth: 06-12-1941  Transition of Care [facility]) CM/SW [place], [place] Phone Number: 10/01/2022, 1:58 PM  Clinical Narrative:      CSW followed up with pt's spouse to get an update on Medicaid application. He reports he has not heard any updates, but will continue to follow up with any news. TOC leadership aware pt will require an LOG. Facilities reviewing. TOC will continue to follow for DC needs.       Expected Discharge Plan and Services                                               Social Determinants of Health (SDOH) Interventions SDOH Screenings   Food Insecurity: Unknown (07/03/2022)  Alcohol Screen: Low Risk  (04/12/2019)  Depression (PHQ2-9): Low Risk  (11/05/2020)  Tobacco Use: Low Risk  (08/20/2022)    Readmission Risk Interventions     No data to display

## 2022-10-02 DIAGNOSIS — E870 Hyperosmolality and hypernatremia: Secondary | ICD-10-CM | POA: Diagnosis not present

## 2022-10-02 DIAGNOSIS — K219 Gastro-esophageal reflux disease without esophagitis: Secondary | ICD-10-CM | POA: Diagnosis not present

## 2022-10-02 DIAGNOSIS — N179 Acute kidney failure, unspecified: Secondary | ICD-10-CM | POA: Diagnosis not present

## 2022-10-02 DIAGNOSIS — G309 Alzheimer's disease, unspecified: Secondary | ICD-10-CM | POA: Diagnosis not present

## 2022-10-02 NOTE — Plan of Care (Signed)
  Problem: Education: Goal: Knowledge of General Education information will improve Description: Including pain rating scale, medication(s)/side effects and non-pharmacologic comfort measures Outcome: Not Progressing   Problem: Health Behavior/Discharge Planning: Goal: Ability to manage health-related needs will improve Outcome: Not Progressing   Problem: Clinical Measurements: Goal: Will remain free from infection Outcome: Progressing Goal: Cardiovascular complication will be avoided Outcome: Progressing

## 2022-10-02 NOTE — Progress Notes (Signed)
[facility]  PROGRESS NOTE  Donna Simmons KPT:[id number] DOB: 1941/03/12 DOA: 08/25/2022 PCP: [name], DO   Brief HPI:   82 year old female with medical history of Alzheimer's dementia, hypertension, hyperlipidemia, permanent atrial fibrillation on Eliquis came to [name] ED on 12/25 from [place] facility for failure to thrive.  Patient has decreased oral intake for past 3 days and increased lethargy.  Workup revealed ESBL UTI and patient completed antibiotic course with IV ertapenem.  Patient improved and is currently awaiting placement.    Subjective   Patient seen and examined, no new complaints.   Assessment/Plan:    ESBL E. coli UTI -Completed 5 days course of ertapenem  Hypernatremia -Presented with sodium of 167 due to poor p.o. intake -Started on IV fluids with D5W -Sodium has improved to 138  Acute kidney injury -Resolved -Presented with creatinine of 1.1, baseline 0.7 -Likely from CKD rotation -Creatinine has improved to 0.49  Iron deficiency anemia -Hemoglobin stable, continue supplementation with ferrous sulfate  Permanent atrial fibrillation -Continue flecainide, apixaban -Metoprolol on hold due to soft blood pressure  Alzheimer's dementia -Continue delirium precautions  Hypokalemia -Replete  Hypophosphatemia -Replete    Medications     apixaban  5 mg Oral BID   feeding supplement  237 mL Oral TID BM   ferrous sulfate  325 mg Oral QODAY   flecainide  50 mg Oral BID   leptospermum manuka honey  1 Application Topical Daily   multivitamin with minerals  1 tablet Oral Daily   pantoprazole  40 mg Oral Daily     Data Reviewed:   CBG:  No results for input(s): "GLUCAP" in the last 168 hours.  SpO2: 100 % O2 Flow Rate (L/min): 2 L/min    Vitals:   10/01/22 1541 10/01/22 1952 10/02/22 0408 10/02/22 0849  BP: 102/65 111/72 123/69 110/70  Pulse: 83 (!) 104 (!) 108 (!) 109  Resp: 18 17 18 16   Temp:  98.1 F (36.7 C) 98.6 F (37 C) 98.2 F (36.8 C) 99.2 F (37.3 C)  TempSrc: Oral  Axillary Axillary  SpO2: 98% 100% 100% 100%  Weight:      Height:          Data Reviewed:  Basic Metabolic Panel: Recent Labs  Lab 09/27/22 0136 09/29/22 0303 09/30/22 0441 10/01/22 0248  NA 141 147* 138 138  K 3.7 3.2* 4.2 3.5  CL 106 110 106 102  CO2 26 28 25 27   GLUCOSE 129* 103* 106* 90  BUN 22 25* 15 13  CREATININE 0.59 0.58 0.50 0.49  CALCIUM 8.5* 8.6* 8.6* 8.5*  MG 2.0 2.4  --  2.0  PHOS 2.2* 3.6 3.4 4.0    CBC: Recent Labs  Lab 09/27/22 0136 09/29/22 0303 10/01/22 0248  WBC 9.8 10.5 8.0  NEUTROABS 8.2*  --  6.4  HGB 8.1* 8.6* 8.2*  HCT 26.5* 27.6* 26.0*  MCV 76.1* 75.6* 74.1*  PLT 593* 593* 501*    LFT Recent Labs  Lab 09/27/22 0136 10/01/22 0248  AST 31 31  ALT 25 32  ALKPHOS 76 70  BILITOT 0.3 0.4  PROT 6.4* 5.8*  ALBUMIN 2.0* 1.9*     Antibiotics: Anti-infectives (From admission, onward)    Start     Dose/Rate Route Frequency Ordered Stop   09/03/22 1000  ertapenem (INVANZ) 1,000 mg in sodium chloride 0.9 % 100 mL IVPB        1 g 200 mL/hr over 30 Minutes Intravenous Every  24 hours 09/02/22 1421 09/04/22 2048   08/30/22 1500  ertapenem (INVANZ) 1,000 mg in sodium chloride 0.9 % 100 mL IVPB  Status:  Discontinued        1 g 200 mL/hr over 30 Minutes Intravenous Every 24 hours 08/30/22 1419 09/02/22 1421   08/29/22 1115  cefTRIAXone (ROCEPHIN) 1 g in sodium chloride 0.9 % 100 mL IVPB  Status:  Discontinued        1 g 200 mL/hr over 30 Minutes Intravenous Every 24 hours 08/29/22 1017 08/30/22 1419        DVT prophylaxis: SCDs  Code Status: DNR  Family Communication: No family at bedside   CONSULTS    Objective    Physical Examination:  Appears in no acute distress S1-S2, regular Neuro-alert, nonverbal  Status is: Inpatient:      Pressure Injury 09/04/22 Sacrum Unstageable - Full thickness tissue loss in which the base of the  injury is covered by slough (yellow, tan, gray, green or brown) and/or eschar (tan, brown or black) in the wound bed. (Active)  09/04/22 1030  Location: Sacrum  Location Orientation:   Staging: Unstageable - Full thickness tissue loss in which the base of the injury is covered by slough (yellow, tan, gray, green or brown) and/or eschar (tan, brown or black) in the wound bed.  Wound Description (Comments):   Present on Admission: No     Pressure Injury 09/30/22 Hip Right Deep Tissue Pressure Injury - Purple or maroon localized area of discolored intact skin or blood-filled blister due to damage of underlying soft tissue from pressure and/or shear. 2 cms x 1 cm x 0.1 cms 80% red moist 20 (Active)  09/30/22 1200  Location: Hip  Location Orientation: Right  Staging: Deep Tissue Pressure Injury - Purple or maroon localized area of discolored intact skin or blood-filled blister due to damage of underlying soft tissue from pressure and/or shear.  Wound Description (Comments): 2 cms x 1 cm x 0.1 cms 80% red moist 20% yellow  Present on Admission: No        [place]   [facility] If 7PM-7AM, please contact night-coverage at [email or web address], Office  [phone]   10/02/2022, 11:31 AM  LOS: 37 days

## 2022-10-02 NOTE — Progress Notes (Signed)
Pt refused morning meds. Pt meds given crushed in puree. Pt refused to open mouth to take medications. Nurse will try again later to give medications.

## 2022-10-03 DIAGNOSIS — E870 Hyperosmolality and hypernatremia: Secondary | ICD-10-CM | POA: Diagnosis not present

## 2022-10-03 DIAGNOSIS — N179 Acute kidney failure, unspecified: Secondary | ICD-10-CM | POA: Diagnosis not present

## 2022-10-03 NOTE — TOC Progression Note (Signed)
Transition of Care [facility]) - Progression Note    Patient Details  Name: Donna Simmons MRN: [id number] Date of Birth: Aug 27, 1941  Transition of Care [facility]) CM/SW [place], [place] Phone Number: 10/03/2022, 3:19 PM  Clinical Narrative:     No updates concerning placement at this time. Medicaid application still being reviewed by DSS. TOC will continue to follow for placement.        Expected Discharge Plan and Services                                               Social Determinants of Health (SDOH) Interventions SDOH Screenings   Food Insecurity: Unknown (07/03/2022)  Alcohol Screen: Low Risk  (04/12/2019)  Depression (PHQ2-9): Low Risk  (11/05/2020)  Tobacco Use: Low Risk  (08/20/2022)    Readmission Risk Interventions     No data to display

## 2022-10-03 NOTE — Progress Notes (Signed)
[facility]  PROGRESS NOTE  Donna Simmons VHQ:[id number] DOB: July 28, 1941 DOA: 08/25/2022 PCP: [name], DO   Brief HPI:   82 year old female with medical history of Alzheimer's dementia, hypertension, hyperlipidemia, permanent atrial fibrillation on Eliquis came [name] ED on 12/25 from [place] facility for failure to thrive.  Patient has decreased oral intake for past 3 days and increased lethargy.  Workup revealed ESBL UTI and patient completed antibiotic course with IV ertapenem.  Patient improved and is currently awaiting placement.    Subjective   Patient seen and examined, no new complaints.  Awaiting bed at skilled nursing facility   Assessment/Plan:    ESBL E. coli UTI -Completed 5 days course of ertapenem  Hypernatremia -Presented with sodium of 167 due to poor p.o. intake -Started on IV fluids with D5W -Sodium has improved to 138  Acute kidney injury -Resolved -Presented with creatinine of 1.1, baseline 0.7 -Likely from CKD rotation -Creatinine has improved to 0.49  Iron deficiency anemia -Hemoglobin stable, continue supplementation with ferrous sulfate  Permanent atrial fibrillation -Continue flecainide, apixaban -Metoprolol on hold due to soft blood pressure  Alzheimer's dementia -Continue delirium precautions  Hypokalemia -Replete  Hypophosphatemia -Replete    Medications     apixaban  5 mg Oral BID   feeding supplement  237 mL Oral TID BM   ferrous sulfate  325 mg Oral QODAY   flecainide  50 mg Oral BID   leptospermum manuka honey  1 Application Topical Daily   multivitamin with minerals  1 tablet Oral Daily   pantoprazole  40 mg Oral Daily     Data Reviewed:   CBG:  No results for input(s): "GLUCAP" in the last 168 hours.  SpO2: 100 % O2 Flow Rate (L/min): 2 L/min    Vitals:   10/02/22 1928 10/03/22 0439 10/03/22 0500 10/03/22 0810  BP: 112/69 114/73  108/64  Pulse: (!) 104 (!) 102  (!)  104  Resp: 17 14  16   Temp: 98.4 F (36.9 C) 97.8 F (36.6 C)  98.3 F (36.8 C)  TempSrc: Oral Oral  Oral  SpO2: 100% 100%  100%  Weight:   64.4 kg   Height:          Data Reviewed:  Basic Metabolic Panel: Recent Labs  Lab 09/27/22 0136 09/29/22 0303 09/30/22 0441 10/01/22 0248  NA 141 147* 138 138  K 3.7 3.2* 4.2 3.5  CL 106 110 106 102  CO2 26 28 25 27   GLUCOSE 129* 103* 106* 90  BUN 22 25* 15 13  CREATININE 0.59 0.58 0.50 0.49  CALCIUM 8.5* 8.6* 8.6* 8.5*  MG 2.0 2.4  --  2.0  PHOS 2.2* 3.6 3.4 4.0    CBC: Recent Labs  Lab 09/27/22 0136 09/29/22 0303 10/01/22 0248  WBC 9.8 10.5 8.0  NEUTROABS 8.2*  --  6.4  HGB 8.1* 8.6* 8.2*  HCT 26.5* 27.6* 26.0*  MCV 76.1* 75.6* 74.1*  PLT 593* 593* 501*    LFT Recent Labs  Lab 09/27/22 0136 10/01/22 0248  AST 31 31  ALT 25 32  ALKPHOS 76 70  BILITOT 0.3 0.4  PROT 6.4* 5.8*  ALBUMIN 2.0* 1.9*     Antibiotics: Anti-infectives (From admission, onward)    Start     Dose/Rate Route Frequency Ordered Stop   09/03/22 1000  ertapenem (INVANZ) 1,000 mg in sodium chloride 0.9 % 100 mL IVPB        1 g 200 mL/hr  over 30 Minutes Intravenous Every 24 hours 09/02/22 1421 09/04/22 2048   08/30/22 1500  ertapenem (INVANZ) 1,000 mg in sodium chloride 0.9 % 100 mL IVPB  Status:  Discontinued        1 g 200 mL/hr over 30 Minutes Intravenous Every 24 hours 08/30/22 1419 09/02/22 1421   08/29/22 1115  cefTRIAXone (ROCEPHIN) 1 g in sodium chloride 0.9 % 100 mL IVPB  Status:  Discontinued        1 g 200 mL/hr over 30 Minutes Intravenous Every 24 hours 08/29/22 1017 08/30/22 1419        DVT prophylaxis: SCDs  Code Status: DNR  Family Communication: No family at bedside   CONSULTS    Objective    Physical Examination:  Alert, nonverbal Lungs clear to auscultation bilaterally Abdomen is soft, nontender, no organomegaly  Status is: Inpatient:      Pressure Injury 09/04/22 Sacrum Unstageable - Full  thickness tissue loss in which the base of the injury is covered by slough (yellow, tan, gray, green or brown) and/or eschar (tan, brown or black) in the wound bed. (Active)  09/04/22 1030  Location: Sacrum  Location Orientation:   Staging: Unstageable - Full thickness tissue loss in which the base of the injury is covered by slough (yellow, tan, gray, green or brown) and/or eschar (tan, brown or black) in the wound bed.  Wound Description (Comments):   Present on Admission: No     Pressure Injury 09/30/22 Hip Right Deep Tissue Pressure Injury - Purple or maroon localized area of discolored intact skin or blood-filled blister due to damage of underlying soft tissue from pressure and/or shear. 2 cms x 1 cm x 0.1 cms 80% red moist 20 (Active)  09/30/22 1200  Location: Hip  Location Orientation: Right  Staging: Deep Tissue Pressure Injury - Purple or maroon localized area of discolored intact skin or blood-filled blister due to damage of underlying soft tissue from pressure and/or shear.  Wound Description (Comments): 2 cms x 1 cm x 0.1 cms 80% red moist 20% yellow  Present on Admission: No        [place]   [facility] If 7PM-7AM, please contact night-coverage at [email or web address], Office  [phone]   10/03/2022, 3:32 PM  LOS: 38 days

## 2022-10-04 DIAGNOSIS — E870 Hyperosmolality and hypernatremia: Secondary | ICD-10-CM | POA: Diagnosis not present

## 2022-10-04 DIAGNOSIS — N179 Acute kidney failure, unspecified: Secondary | ICD-10-CM | POA: Diagnosis not present

## 2022-10-04 NOTE — Progress Notes (Signed)
[facility]  PROGRESS NOTE  TOI Donna Simmons:[id number] DOB: [birth year] DOA: 08/25/2022 PCP: [name], DO   Brief HPI:   82 year old female with medical history of Alzheimer's dementia, hypertension, hyperlipidemia, permanent atrial fibrillation on Eliquis came [name] ED on 12/25 from [place] facility for failure to thrive.  Patient has decreased oral intake for past 3 days and increased lethargy.  Workup revealed ESBL UTI and patient completed antibiotic course with IV ertapenem.  Patient improved and is currently awaiting placement.    Subjective   Patient seen and examined, no new complaints.  Patient's son at bedside   Assessment/Plan:    ESBL E. coli UTI -Completed 5 days course of ertapenem  Hypernatremia -Presented with sodium of 167 due to poor p.o. intake -Started on IV fluids with D5W -Sodium has improved to 138  Acute kidney injury -Resolved -Presented with creatinine of 1.1, baseline 0.7 -Likely from CKD rotation -Creatinine has improved to 0.49  Iron deficiency anemia -Hemoglobin stable, continue supplementation with ferrous sulfate  Permanent atrial fibrillation -Continue flecainide, apixaban -Metoprolol on hold due to soft blood pressure  Alzheimer's dementia -Continue delirium precautions  Hypokalemia -Replete  Hypophosphatemia -Replete    Medications     apixaban  5 mg Oral BID   feeding supplement  237 mL Oral TID BM   ferrous sulfate  325 mg Oral QODAY   flecainide  50 mg Oral BID   leptospermum manuka honey  1 Application Topical Daily   multivitamin with minerals  1 tablet Oral Daily   pantoprazole  40 mg Oral Daily     Data Reviewed:   CBG:  No results for input(s): "GLUCAP" in the last 168 hours.  SpO2: (!) 79 % O2 Flow Rate (L/min): 2 L/min    Vitals:   10/03/22 1557 10/03/22 2014 10/04/22 0425 10/04/22 0723  BP: 97/74 99/68 106/70 (!) 144/106  Pulse: 61 100 96 97  Resp: 16 15  16 16   Temp: 98.4 F (36.9 C) 99.1 F (37.3 C) 97.8 F (36.6 C) 98.1 F (36.7 C)  TempSrc: Oral Oral Oral Oral  SpO2: 100% 99% 100% (!) 79%  Weight:   66.8 kg   Height:          Data Reviewed:  Basic Metabolic Panel: Recent Labs  Lab 09/29/22 0303 09/30/22 0441 10/01/22 0248  NA 147* 138 138  K 3.2* 4.2 3.5  CL 110 106 102  CO2 28 25 27   GLUCOSE 103* 106* 90  BUN 25* 15 13  CREATININE 0.58 0.50 0.49  CALCIUM 8.6* 8.6* 8.5*  MG 2.4  --  2.0  PHOS 3.6 3.4 4.0    CBC: Recent Labs  Lab 09/29/22 0303 10/01/22 0248  WBC 10.5 8.0  NEUTROABS  --  6.4  HGB 8.6* 8.2*  HCT 27.6* 26.0*  MCV 75.6* 74.1*  PLT 593* 501*    LFT Recent Labs  Lab 10/01/22 0248  AST 31  ALT 32  ALKPHOS 70  BILITOT 0.4  PROT 5.8*  ALBUMIN 1.9*     Antibiotics: Anti-infectives (From admission, onward)    Start     Dose/Rate Route Frequency Ordered Stop   09/03/22 1000  ertapenem (INVANZ) 1,000 mg in sodium chloride 0.9 % 100 mL IVPB        1 g 200 mL/hr over 30 Minutes Intravenous Every 24 hours 09/02/22 1421 09/04/22 2048   08/30/22 1500  ertapenem (INVANZ) 1,000 mg in sodium chloride 0.9 % 100 mL  IVPB  Status:  Discontinued        1 g 200 mL/hr over 30 Minutes Intravenous Every 24 hours 08/30/22 1419 09/02/22 1421   08/29/22 1115  cefTRIAXone (ROCEPHIN) 1 g in sodium chloride 0.9 % 100 mL IVPB  Status:  Discontinued        1 g 200 mL/hr over 30 Minutes Intravenous Every 24 hours 08/29/22 1017 08/30/22 1419        DVT prophylaxis: SCDs  Code Status: DNR  Family Communication: Discussed with patient's son at bedside   CONSULTS    Objective    Physical Examination:  Appears in no acute distress S1-S2, regular, no murmur auscultated Lungs are clear to auscultation bilaterally Abdomen is soft, nontender, no organomegaly  Status is: Inpatient:      Pressure Injury 09/04/22 Sacrum Unstageable - Full thickness tissue loss in which the base of the injury is  covered by slough (yellow, tan, gray, green or brown) and/or eschar (tan, brown or black) in the wound bed. (Active)  09/04/22 1030  Location: Sacrum  Location Orientation:   Staging: Unstageable - Full thickness tissue loss in which the base of the injury is covered by slough (yellow, tan, gray, green or brown) and/or eschar (tan, brown or black) in the wound bed.  Wound Description (Comments):   Present on Admission: No     Pressure Injury 09/30/22 Hip Right Deep Tissue Pressure Injury - Purple or maroon localized area of discolored intact skin or blood-filled blister due to damage of underlying soft tissue from pressure and/or shear. 2 cms x 1 cm x 0.1 cms 80% red moist 20 (Active)  09/30/22 1200  Location: Hip  Location Orientation: Right  Staging: Deep Tissue Pressure Injury - Purple or maroon localized area of discolored intact skin or blood-filled blister due to damage of underlying soft tissue from pressure and/or shear.  Wound Description (Comments): 2 cms x 1 cm x 0.1 cms 80% red moist 20% yellow  Present on Admission: No        [place]   [facility] If 7PM-7AM, please contact night-coverage at [email or web address], Office  [phone]   10/04/2022, 11:56 AM  LOS: 39 days

## 2022-10-05 DIAGNOSIS — N179 Acute kidney failure, unspecified: Secondary | ICD-10-CM | POA: Diagnosis not present

## 2022-10-05 DIAGNOSIS — E870 Hyperosmolality and hypernatremia: Secondary | ICD-10-CM | POA: Diagnosis not present

## 2022-10-05 NOTE — Plan of Care (Signed)

## 2022-10-05 NOTE — Progress Notes (Signed)
[facility]  PROGRESS NOTE  Donna Simmons HQI:[id number] DOB: [birth year] DOA: 08/25/2022 PCP: [name], DO   Brief HPI:   82 year old female with medical history of Alzheimer's dementia, hypertension, hyperlipidemia, permanent atrial fibrillation on Eliquis came [name] on 12/25 from [place] facility for failure to thrive.  Patient has decreased oral intake for past 3 days and increased lethargy.  Workup revealed ESBL UTI and patient completed antibiotic course with IV ertapenem.  Patient improved and is currently awaiting placement.    Subjective   Patient seen examined, no new complaints.  Awaiting bed at skilled nursing facility.  Patient's son at bedside   Assessment/Plan:    ESBL E. coli UTI -Completed 5 days course of ertapenem  Hypernatremia -Presented with sodium of 167 due to poor p.o. intake -Started on IV fluids with D5W -Sodium has improved to 138  Acute kidney injury -Resolved -Presented with creatinine of 1.1, baseline 0.7 -Likely from CKD rotation -Creatinine has improved to 0.49  Iron deficiency anemia -Hemoglobin stable, continue supplementation with ferrous sulfate  Permanent atrial fibrillation -Continue flecainide, apixaban -Metoprolol on hold due to soft blood pressure  Alzheimer's dementia -Continue delirium precautions  Hypokalemia -Replete  Hypophosphatemia -Replete    Medications     apixaban  5 mg Oral BID   feeding supplement  237 mL Oral TID BM   ferrous sulfate  325 mg Oral QODAY   flecainide  50 mg Oral BID   leptospermum manuka honey  1 Application Topical Daily   multivitamin with minerals  1 tablet Oral Daily   pantoprazole  40 mg Oral Daily     Data Reviewed:   CBG:  No results for input(s): "GLUCAP" in the last 168 hours.  SpO2: 99 % O2 Flow Rate (L/min): 2 L/min    Vitals:   10/04/22 2043 10/05/22 0425 10/05/22 0823 10/05/22 1028  BP: 106/62 101/72 (!) 88/57 (!)  100/57  Pulse: (!) 124 (!) 108 (!) 104 (!) 104  Resp: 16 15 16    Temp: 99.8 F (37.7 C) 99.1 F (37.3 C) 97.9 F (36.6 C) 97.7 F (36.5 C)  TempSrc: Oral Oral Oral Oral  SpO2: 99% 98% 99% 99%  Weight:      Height:          Data Reviewed:  Basic Metabolic Panel: Recent Labs  Lab 09/29/22 0303 09/30/22 0441 10/01/22 0248  NA 147* 138 138  K 3.2* 4.2 3.5  CL 110 106 102  CO2 28 25 27   GLUCOSE 103* 106* 90  BUN 25* 15 13  CREATININE 0.58 0.50 0.49  CALCIUM 8.6* 8.6* 8.5*  MG 2.4  --  2.0  PHOS 3.6 3.4 4.0    CBC: Recent Labs  Lab 09/29/22 0303 10/01/22 0248  WBC 10.5 8.0  NEUTROABS  --  6.4  HGB 8.6* 8.2*  HCT 27.6* 26.0*  MCV 75.6* 74.1*  PLT 593* 501*    LFT Recent Labs  Lab 10/01/22 0248  AST 31  ALT 32  ALKPHOS 70  BILITOT 0.4  PROT 5.8*  ALBUMIN 1.9*     Antibiotics: Anti-infectives (From admission, onward)    Start     Dose/Rate Route Frequency Ordered Stop   09/03/22 1000  ertapenem (INVANZ) 1,000 mg in sodium chloride 0.9 % 100 mL IVPB        1 g 200 mL/hr over 30 Minutes Intravenous Every 24 hours 09/02/22 1421 09/04/22 2048   08/30/22 1500  ertapenem (INVANZ) 1,000  mg in sodium chloride 0.9 % 100 mL IVPB  Status:  Discontinued        1 g 200 mL/hr over 30 Minutes Intravenous Every 24 hours 08/30/22 1419 09/02/22 1421   08/29/22 1115  cefTRIAXone (ROCEPHIN) 1 g in sodium chloride 0.9 % 100 mL IVPB  Status:  Discontinued        1 g 200 mL/hr over 30 Minutes Intravenous Every 24 hours 08/29/22 1017 08/30/22 1419        DVT prophylaxis: SCDs  Code Status: DNR  Family Communication: Discussed with patient's son at bedside   CONSULTS    Objective    Physical Examination:  Appearance no acute distress S1-S2, regular, no murmur auscultated Lungs clear to auscultation bilaterally  Status is: Inpatient:      Pressure Injury 09/04/22 Sacrum Unstageable - Full thickness tissue loss in which the base of the injury is  covered by slough (yellow, tan, gray, green or brown) and/or eschar (tan, brown or black) in the wound bed. (Active)  09/04/22 1030  Location: Sacrum  Location Orientation:   Staging: Unstageable - Full thickness tissue loss in which the base of the injury is covered by slough (yellow, tan, gray, green or brown) and/or eschar (tan, brown or black) in the wound bed.  Wound Description (Comments):   Present on Admission: No     Pressure Injury 09/30/22 Hip Right Deep Tissue Pressure Injury - Purple or maroon localized area of discolored intact skin or blood-filled blister due to damage of underlying soft tissue from pressure and/or shear. 2 cms x 1 cm x 0.1 cms 80% red moist 20 (Active)  09/30/22 1200  Location: Hip  Location Orientation: Right  Staging: Deep Tissue Pressure Injury - Purple or maroon localized area of discolored intact skin or blood-filled blister due to damage of underlying soft tissue from pressure and/or shear.  Wound Description (Comments): 2 cms x 1 cm x 0.1 cms 80% red moist 20% yellow  Present on Admission: No        [place]   [facility] If 7PM-7AM, please contact night-coverage at [email or web address], Office  [phone]   10/05/2022, 2:16 PM  LOS: 40 days

## 2022-10-06 DIAGNOSIS — E870 Hyperosmolality and hypernatremia: Secondary | ICD-10-CM | POA: Diagnosis not present

## 2022-10-06 DIAGNOSIS — N179 Acute kidney failure, unspecified: Secondary | ICD-10-CM | POA: Diagnosis not present

## 2022-10-06 NOTE — Progress Notes (Signed)
[facility]  PROGRESS NOTE  Donna Simmons IOE:[id number] DOB: 01/06/1941 DOA: 08/25/2022 PCP: [name], DO   Brief HPI:   82 year old female with medical history of Alzheimer's dementia, hypertension, hyperlipidemia, permanent atrial fibrillation on Eliquis came to [name] ED on 12/25 from [facility] for failure to thrive.  Patient has decreased oral intake for past 3 days and increased lethargy.  Workup revealed ESBL UTI and patient completed antibiotic course with IV ertapenem.  Patient improved and is currently awaiting placement.    Subjective   Patient seen and examined, no complaints.   Assessment/Plan:    ESBL E. coli UTI -Completed 5 days course of ertapenem  Hypernatremia -Presented with sodium of 167 due to poor p.o. intake -Started on IV fluids with D5W -Sodium has improved to 138  Acute kidney injury -Resolved -Presented with creatinine of 1.1, baseline 0.7 -Likely from CKD rotation -Creatinine has improved to 0.49  Iron deficiency anemia -Hemoglobin stable, continue supplementation with ferrous sulfate  Permanent atrial fibrillation -Continue flecainide, apixaban -Metoprolol on hold due to soft blood pressure  Alzheimer's dementia -Continue delirium precautions  Hypokalemia -Replete  Hypophosphatemia -Replete    Medications     apixaban  5 mg Oral BID   feeding supplement  237 mL Oral TID BM   ferrous sulfate  325 mg Oral QODAY   flecainide  50 mg Oral BID   leptospermum manuka honey  1 Application Topical Daily   multivitamin with minerals  1 tablet Oral Daily   pantoprazole  40 mg Oral Daily     Data Reviewed:   CBG:  No results for input(s): "GLUCAP" in the last 168 hours.  SpO2: 93 % O2 Flow Rate (L/min): 2 L/min    Vitals:   10/05/22 1028 10/05/22 1448 10/05/22 2017 10/06/22 0758  BP: (!) 100/57 101/71 111/74 90/74  Pulse: (!) 104 (!) 112 (!) 110 91  Resp:  16 20 16   Temp: 97.7 F  (36.5 C) 97.9 F (36.6 C) 97.7 F (36.5 C) 98.2 F (36.8 C)  TempSrc: Oral Oral Oral Oral  SpO2: 99% 97% 100% 93%  Weight:      Height:          Data Reviewed:  Basic Metabolic Panel: Recent Labs  Lab 09/30/22 0441 10/01/22 0248  NA 138 138  K 4.2 3.5  CL 106 102  CO2 25 27  GLUCOSE 106* 90  BUN 15 13  CREATININE 0.50 0.49  CALCIUM 8.6* 8.5*  MG  --  2.0  PHOS 3.4 4.0    CBC: Recent Labs  Lab 10/01/22 0248  WBC 8.0  NEUTROABS 6.4  HGB 8.2*  HCT 26.0*  MCV 74.1*  PLT 501*    LFT Recent Labs  Lab 10/01/22 0248  AST 31  ALT 32  ALKPHOS 70  BILITOT 0.4  PROT 5.8*  ALBUMIN 1.9*     Antibiotics: Anti-infectives (From admission, onward)    Start     Dose/Rate Route Frequency Ordered Stop   09/03/22 1000  ertapenem (INVANZ) 1,000 mg in sodium chloride 0.9 % 100 mL IVPB        1 g 200 mL/hr over 30 Minutes Intravenous Every 24 hours 09/02/22 1421 09/04/22 2048   08/30/22 1500  ertapenem (INVANZ) 1,000 mg in sodium chloride 0.9 % 100 mL IVPB  Status:  Discontinued        1 g 200 mL/hr over 30 Minutes Intravenous Every 24 hours 08/30/22 1419 09/02/22 1421  08/29/22 1115  cefTRIAXone (ROCEPHIN) 1 g in sodium chloride 0.9 % 100 mL IVPB  Status:  Discontinued        1 g 200 mL/hr over 30 Minutes Intravenous Every 24 hours 08/29/22 1017 08/30/22 1419        DVT prophylaxis: SCDs  Code Status: DNR  Family Communication: Discussed with patient's son at bedside   CONSULTS    Objective    Physical Examination:  Appears in no acute distress Nonverbal Abdomen is soft, nontender Lungs clear to auscultation bilaterally  Status is: Inpatient:      Pressure Injury 09/04/22 Sacrum Unstageable - Full thickness tissue loss in which the base of the injury is covered by slough (yellow, tan, gray, green or brown) and/or eschar (tan, brown or black) in the wound bed. (Active)  09/04/22 1030  Location: Sacrum  Location Orientation:   Staging:  Unstageable - Full thickness tissue loss in which the base of the injury is covered by slough (yellow, tan, gray, green or brown) and/or eschar (tan, brown or black) in the wound bed.  Wound Description (Comments):   Present on Admission: No     Pressure Injury 09/30/22 Hip Right Deep Tissue Pressure Injury - Purple or maroon localized area of discolored intact skin or blood-filled blister due to damage of underlying soft tissue from pressure and/or shear. 2 cms x 1 cm x 0.1 cms 80% red moist 20 (Active)  09/30/22 1200  Location: Hip  Location Orientation: Right  Staging: Deep Tissue Pressure Injury - Purple or maroon localized area of discolored intact skin or blood-filled blister due to damage of underlying soft tissue from pressure and/or shear.  Wound Description (Comments): 2 cms x 1 cm x 0.1 cms 80% red moist 20% yellow  Present on Admission: No        [place]   [facility] If 7PM-7AM, please contact night-coverage at [email or web address], Office  [phone]   10/06/2022, 10:08 AM  LOS: 41 days

## 2022-10-06 NOTE — Consult Note (Addendum)
[place] Nurse wound follow up Wound type: Pressure  Measurement:  Sacrum remains Unstageable Pressure Injury 2 cms x 1.5 cms x 1.5 cms, 75% loosely adherent slough 25% red  R lateral knee Stage 2 healing, no open area noted  R medial knee Stage 2 healing, no open area notged R medial foot (base of R great toe) full thickness 3 cms x 3 cms x 0.2 cms 50% yellow devitalized tissue 50% red moist  L lateral foot Deep Tissue Pressure Injury that has evolved to full thickness 1.5 cms x 1 cm x 0.1 cm 50% yellow 50% red moist  R 5th digit healing full thickness 1.5 x 2 cms pink moist  R hip Deep Tissue Injury that has evolved to Unstageable 4 cms x 2 cms x 0.1 cms 100% tan yellow devitalized tissue  Drainage (amount, consistency, odor) serosanguinous to Sacrum, R medial foot, L lateral foot.   Periwound: old pink scar tissue noted surrounding sacral wound, otherwise intact  Dressing procedure/placement/frequency: Continue with Medihoney to Sacrum and R medial foot (base of great toe).   R hip Unstageable clean with NS, apply Medihoney to gauze and place in wound bed daily, cover with foam dressing.   L lateral foot full thickness clean with NS, apply Medihoney to gauze daily and place in wound bed, cover with foam dressing.  Continue with foam dressings to R knee, R 5th digit.  All foam dressings to be changed q 3 days and prn soiling.    Low air loss mattress ordered for this patient for moisture management and pressure redistribution.  Prevalon boots ordered for off loading of heels and protection of other pressure points due to patient being contracted.    WOC will continue to follow this patient weekly to determine changes in plan of care.   Thank you,    Kalee Broxton MSN, RN-BC, [employer]

## 2022-10-07 DIAGNOSIS — N179 Acute kidney failure, unspecified: Secondary | ICD-10-CM | POA: Diagnosis not present

## 2022-10-07 DIAGNOSIS — E870 Hyperosmolality and hypernatremia: Secondary | ICD-10-CM | POA: Diagnosis not present

## 2022-10-07 NOTE — TOC Progression Note (Signed)
Transition of Care [facility]) - Progression Note    Patient Details  Name: Donna Simmons MRN: [id number] Date of Birth: [birth year]  Transition of Care [facility]) CM/SW [place], [place] Phone Number: 10/07/2022, 11:51 AM  Clinical Narrative:     CSW followed up with pt's spouse to see if there are any updates on Medicaid application. Per spouse, as of yet, he has not heard anything, but states he is continuing to follow up as well, as he understands the importance. TOC will continue to follow for DC needs.        Expected Discharge Plan and Services                                               Social Determinants of Health (SDOH) Interventions SDOH Screenings   Food Insecurity: Unknown (07/03/2022)  Alcohol Screen: Low Risk  (04/12/2019)  Depression (PHQ2-9): Low Risk  (11/05/2020)  Tobacco Use: Low Risk  (08/20/2022)    Readmission Risk Interventions     No data to display

## 2022-10-07 NOTE — Progress Notes (Signed)
Nutrition Follow-up  DOCUMENTATION CODES:   Severe malnutrition in context of chronic illness  INTERVENTION:  Continue Ensure Enlive po TID, each supplement provides 350 kcal and 20 grams of protein. Continue meal feeding with assistance Continue MVI with minerals daily  NUTRITION DIAGNOSIS:  Severe Malnutrition related to chronic illness as evidenced by severe muscle depletion, severe fat depletion. - Ongoing  GOAL:  Patient will meet greater than or equal to 90% of their needs - Ongoing  MONITOR:  PO intake, Supplement acceptance, Weight trends, Labs  REASON FOR ASSESSMENT:  Consult Assessment of nutrition requirement/status  ASSESSMENT:  82 y.o. female presented to the ED with failure to thrive from SNF, recent decreased PO intake and lethargy. PMH includes Alzheimer's dementia, GERD, HTN, and A. Fib. Pt admitted with hypernatremia, AKI, dehydration, and hypokalemia.  Discussed case with RN. RN reports that pt drank a whole Ensure this morning and a cup of pudding. Pt remains medically stable for discharge, still pending placement.   Medications reviewed and include: Ferrous Sulfate, MVI, Protonix Labs reviewed.  Diet Order:   Diet Order             DIET - DYS 1 Room service appropriate? Yes; Fluid consistency: Thin  Diet effective now                   EDUCATION NEEDS:  Not appropriate for education at this time  Skin:  Per WOC assessment on 10/06/22:  Sacrum remains Unstageable Pressure Injury 2 cms x 1.5 cms x 1.5 cms, 75% loosely adherent slough 25% red  R lateral knee Stage 2 healing, no open area noted  R medial knee Stage 2 healing, no open area notged R medial foot (base of R great toe) full thickness 3 cms x 3 cms x 0.2 cms 50% yellow devitalized tissue 50% red moist  L lateral foot Deep Tissue Pressure Injury that has evolved to full thickness 1.5 cms x 1 cm x 0.1 cm 50% yellow 50% red moist  R 5th digit healing full thickness 1.5 x 2 cms pink moist   R hip Deep Tissue Injury that has evolved to Unstageable 4 cms x 2 cms x 0.1 cms 100% tan yellow devitalized tissue   Last BM:  2/6  Height:  Ht Readings from Last 1 Encounters:  08/30/22 5\' 2"  (1.575 m)   Weight:  Wt Readings from Last 1 Encounters:  10/07/22 64.4 kg   Ideal Body Weight:  50 kg  BMI:  Body mass index is 25.97 kg/m.  Estimated Nutritional Needs:  Kcal:  1500-1700 Protein:  75-90 grams Fluid:  >/= 1.5 L   Hermina Barters RD, LDN Clinical Dietitian See [facility] for contact information.

## 2022-10-07 NOTE — Progress Notes (Signed)
[facility]  PROGRESS NOTE  NIL BOLSER ZOX:[id number] DOB: 03-Jan-1941 DOA: 08/25/2022 PCP: [name], DO   Brief HPI:   82 year old female with medical history of Alzheimer's dementia, hypertension, hyperlipidemia, permanent atrial fibrillation on Eliquis came to [name] ED on 12/25 from [facility] facility for failure to thrive.  Patient has decreased oral intake for past 3 days and increased lethargy.  Workup revealed ESBL UTI and patient completed antibiotic course with IV ertapenem.  Patient improved and is currently awaiting placement.    Subjective   Patient seen and examined, drowsy but arousable.  Denies pain.   Assessment/Plan:    ESBL E. coli UTI -Completed 5 days course of ertapenem  Hypernatremia -Presented with sodium of 167 due to poor p.o. intake -Started on IV fluids with D5W -Sodium has improved to 138  Acute kidney injury -Resolved -Presented with creatinine of 1.1, baseline 0.7 -Likely from CKD rotation -Creatinine has improved to 0.49  Iron deficiency anemia -Hemoglobin stable, continue supplementation with ferrous sulfate  Permanent atrial fibrillation -Continue flecainide, apixaban -Metoprolol on hold due to soft blood pressure  Alzheimer's dementia -Continue delirium precautions  Hypokalemia -Replete  Hypophosphatemia -Replete    Medications     apixaban  5 mg Oral BID   feeding supplement  237 mL Oral TID BM   ferrous sulfate  325 mg Oral QODAY   flecainide  50 mg Oral BID   leptospermum manuka honey  1 Application Topical Daily   multivitamin with minerals  1 tablet Oral Daily   pantoprazole  40 mg Oral Daily     Data Reviewed:   CBG:  No results for input(s): "GLUCAP" in the last 168 hours.  SpO2: 100 % O2 Flow Rate (L/min): 2 L/min    Vitals:   10/06/22 1602 10/06/22 2025 10/07/22 0500 10/07/22 0908  BP: 111/73 118/77  (!) 103/56  Pulse: (!) 108 (!) 110  (!) 55  Resp: 16 18  17    Temp: 98.5 F (36.9 C) 98.1 F (36.7 C)  98.5 F (36.9 C)  TempSrc: Oral Oral  Oral  SpO2: 96% 100%  100%  Weight:   64.4 kg   Height:          Data Reviewed:  Basic Metabolic Panel: Recent Labs  Lab 10/01/22 0248  NA 138  K 3.5  CL 102  CO2 27  GLUCOSE 90  BUN 13  CREATININE 0.49  CALCIUM 8.5*  MG 2.0  PHOS 4.0    CBC: Recent Labs  Lab 10/01/22 0248  WBC 8.0  NEUTROABS 6.4  HGB 8.2*  HCT 26.0*  MCV 74.1*  PLT 501*    LFT Recent Labs  Lab 10/01/22 0248  AST 31  ALT 32  ALKPHOS 70  BILITOT 0.4  PROT 5.8*  ALBUMIN 1.9*     Antibiotics: Anti-infectives (From admission, onward)    Start     Dose/Rate Route Frequency Ordered Stop   09/03/22 1000  ertapenem (INVANZ) 1,000 mg in sodium chloride 0.9 % 100 mL IVPB        1 g 200 mL/hr over 30 Minutes Intravenous Every 24 hours 09/02/22 1421 09/04/22 2048   08/30/22 1500  ertapenem (INVANZ) 1,000 mg in sodium chloride 0.9 % 100 mL IVPB  Status:  Discontinued        1 g 200 mL/hr over 30 Minutes Intravenous Every 24 hours 08/30/22 1419 09/02/22 1421   08/29/22 1115  cefTRIAXone (ROCEPHIN) 1 g in sodium chloride 0.9 %  100 mL IVPB  Status:  Discontinued        1 g 200 mL/hr over 30 Minutes Intravenous Every 24 hours 08/29/22 1017 08/30/22 1419        DVT prophylaxis: SCDs  Code Status: DNR  Family Communication: Discussed with patient's son at bedside   CONSULTS    Objective    Physical Examination:  Appears in no acute distress S1-S2, regular, no murmur auscultated Lungs clear to auscultation bilaterally Abdomen is soft, nontender, no organomegaly   Status is: Inpatient:      Pressure Injury 09/04/22 Sacrum Unstageable - Full thickness tissue loss in which the base of the injury is covered by slough (yellow, tan, gray, green or brown) and/or eschar (tan, brown or black) in the wound bed. (Active)  09/04/22 1030  Location: Sacrum  Location Orientation:   Staging: Unstageable -  Full thickness tissue loss in which the base of the injury is covered by slough (yellow, tan, gray, green or brown) and/or eschar (tan, brown or black) in the wound bed.  Wound Description (Comments):   Present on Admission: No     Pressure Injury 09/30/22 Hip Right Deep Tissue Pressure Injury - Purple or maroon localized area of discolored intact skin or blood-filled blister due to damage of underlying soft tissue from pressure and/or shear. 2 cms x 1 cm x 0.1 cms 80% red moist 20 (Active)  09/30/22 1200  Location: Hip  Location Orientation: Right  Staging: Deep Tissue Pressure Injury - Purple or maroon localized area of discolored intact skin or blood-filled blister due to damage of underlying soft tissue from pressure and/or shear.  Wound Description (Comments): 2 cms x 1 cm x 0.1 cms 80% red moist 20% yellow  Present on Admission: No     [place]   [facility] If 7PM-7AM, please contact night-coverage at [email or web address], Office  [phone]   10/07/2022, 10:10 AM  LOS: 42 days

## 2022-10-07 NOTE — Plan of Care (Signed)

## 2022-10-08 DIAGNOSIS — E870 Hyperosmolality and hypernatremia: Secondary | ICD-10-CM | POA: Diagnosis not present

## 2022-10-08 NOTE — Plan of Care (Signed)

## 2022-10-08 NOTE — Progress Notes (Signed)
PROGRESS NOTE  Donna Simmons  DOB: 08/10/1941  PCP: [name], DO ZJQ:[id number]  DOA: 08/25/2022  LOS: 3 days  Hospital Day: 45  Brief narrative: Donna Simmons is a 82 y.o. female with PMH significant for Alzheimer's dementia, HTN, HLD, permanent A-fib on Eliquis, GERD. 12/25, patient was sent to ED from [facility] for poor oral intake, increased lethargy, failure to thrive. Workup showed hypernatremia of 167, AKI.  Urine culture grew ESBL E. coli. Patient completed the course of IV ertapenem.  She could not return to SNF because of unpaid bills.  Family unable to take care of her at home.  Medicaid pending. Difficult to place.  Subjective: Patient was seen and examined this morning.  Elderly African-American female.  Lying on bed.  Opens eyes on verbal command.  Able to answer simple yes no questions.  Demented Blood pressure in 90s Last blood work was a week ago with a stable hemoglobin of 8.4.  Assessment and plan: ESBL E. coli UTI Completed 5 days course of ertapenem   Hypernatremia Presented with sodium of 167 due to poor p.o. intake Started on hypotonic fluid.  Subsequently sodium level improved to 138 on last blood work on 1/30.  Repeat BMP tomorrow No results for input(s): "NA" in the last 168 hours.  Acute kidney injury Resolved Recent Labs    09/09/22 0302 09/10/22 0217 09/15/22 0308 09/17/22 0325 09/22/22 0239 09/24/22 1159 09/27/22 0136 09/29/22 0303 09/30/22 0441 10/01/22 0248  BUN 6* 11 15 10 20 19 22  25* 15 13  CREATININE 0.57 0.57 0.56 0.59 0.53 0.64 0.59 0.58 0.50 0.49   Hypokalemia/hypophosphatemia Improved with replacement.  Recheck tomorrow. No results for input(s): "K", "MG", "PHOS" in the last 168 hours.  Chronic iron deficiency anemia Hemoglobin low but stable.  Repeat tomorrow.  Continue iron supplement. Recent Labs    09/22/22 0239 09/24/22 1159 09/27/22 0136 09/29/22 0303 10/01/22 0248  HGB 8.8* 8.1* 8.1* 8.6* 8.2*   MCV 79.6* 77.3* 76.1* 75.6* 74.1*   Permanent atrial fibrillation Continue flecainide, apixaban Metoprolol on hold due to soft blood pressure   Alzheimer's dementia Continue delirium precautions     Mobility: Encourage ambulation  Goals of care   Code Status: DNR     DVT prophylaxis:  SCDs Start: 08/25/22 0659 apixaban (ELIQUIS) tablet 5 mg   Antimicrobials: None Fluid: None Consultants: None Family Communication: None at bedside  Status is: Inpatient Level of care: Med-Surg   Dispo: Patient is from: SNF              Anticipated d/c is to: Difficult to place. Continue in-hospital care because: Pending placement.  Medically stable for discharge   Scheduled Meds:  apixaban  5 mg Oral BID   feeding supplement  237 mL Oral TID BM   ferrous sulfate  325 mg Oral QODAY   flecainide  50 mg Oral BID   leptospermum manuka honey  1 Application Topical Daily   multivitamin with minerals  1 tablet Oral Daily   pantoprazole  40 mg Oral Daily    PRN meds: acetaminophen **OR** acetaminophen, melatonin, ondansetron **OR** ondansetron (ZOFRAN) IV   Infusions:    Diet:  Diet Order             DIET - DYS 1 Room service appropriate? Yes; Fluid consistency: Thin  Diet effective now                   Antimicrobials: Anti-infectives (From admission, onward)  Start     Dose/Rate Route Frequency Ordered Stop   09/03/22 1000  ertapenem (INVANZ) 1,000 mg in sodium chloride 0.9 % 100 mL IVPB        1 g 200 mL/hr over 30 Minutes Intravenous Every 24 hours 09/02/22 1421 09/04/22 2048   08/30/22 1500  ertapenem (INVANZ) 1,000 mg in sodium chloride 0.9 % 100 mL IVPB  Status:  Discontinued        1 g 200 mL/hr over 30 Minutes Intravenous Every 24 hours 08/30/22 1419 09/02/22 1421   08/29/22 1115  cefTRIAXone (ROCEPHIN) 1 g in sodium chloride 0.9 % 100 mL IVPB  Status:  Discontinued        1 g 200 mL/hr over 30 Minutes Intravenous Every 24 hours 08/29/22 1017 08/30/22 1419        Skin assessment:  Pressure Injury 09/04/22 Sacrum Unstageable - Full thickness tissue loss in which the base of the injury is covered by slough (yellow, tan, gray, green or brown) and/or eschar (tan, brown or black) in the wound bed. (Active)  09/04/22 1030  Location: Sacrum  Location Orientation:   Staging: Unstageable - Full thickness tissue loss in which the base of the injury is covered by slough (yellow, tan, gray, green or brown) and/or eschar (tan, brown or black) in the wound bed.  Wound Description (Comments):   Present on Admission: No     Pressure Injury 09/30/22 Hip Right Deep Tissue Pressure Injury - Purple or maroon localized area of discolored intact skin or blood-filled blister due to damage of underlying soft tissue from pressure and/or shear. 2 cms x 1 cm x 0.1 cms 80% red moist 20 (Active)  09/30/22 1200  Location: Hip  Location Orientation: Right  Staging: Deep Tissue Pressure Injury - Purple or maroon localized area of discolored intact skin or blood-filled blister due to damage of underlying soft tissue from pressure and/or shear.  Wound Description (Comments): 2 cms x 1 cm x 0.1 cms 80% red moist 20% yellow  Present on Admission: No      Nutritional status:  Body mass index is 25.12 kg/m.  Nutrition Problem: Severe Malnutrition Etiology: chronic illness Signs/Symptoms: severe muscle depletion, severe fat depletion     Objective: Vitals:   10/08/22 0501 10/08/22 0840  BP: 115/70 (!) 92/58  Pulse: (!) 103 86  Resp: 18 16  Temp: 98.1 F (36.7 C) 98.5 F (36.9 C)  SpO2: 96% 100%    Intake/Output Summary (Last 24 hours) at 10/08/2022 1345 Last data filed at 10/08/2022 0841 Gross per 24 hour  Intake 220 ml  Output 50 ml  Net 170 ml   Filed Weights   10/04/22 0425 10/07/22 0500 10/08/22 0501  Weight: 66.8 kg 64.4 kg 62.3 kg   Weight change: -2.1 kg Body mass index is 25.12 kg/m.   Physical Exam: General exam: Elderly African-American  female.  Not in physical distress Skin: No rashes, lesions or ulcers. HEENT: Atraumatic, normocephalic, no obvious bleeding Lungs: Clear to auscultation bilaterally CVS: Regular rate and rhythm, no murmur GI/Abd soft, nontender, nondistended, bowel sound present CNS: Alert, awake, able to answer yes/no questions. Psychiatry: Demented at baseline Extremities: No pedal edema, no calf tenderness  Data Review: I have personally reviewed the laboratory data and studies available.  F/u labs ordered Unresulted Labs (From admission, onward)     Start     Ordered   10/09/22 9678  Basic metabolic panel  Tomorrow morning,   R  Question:  Specimen collection method  Answer:  Lab=Lab collect   10/08/22 1024   10/09/22 0500  CBC with Differential/Platelet  Tomorrow morning,   R       Question:  Specimen collection method  Answer:  Lab=Lab collect   10/08/22 1024   10/09/22 0500  Magnesium  Tomorrow morning,   R       Question:  Specimen collection method  Answer:  Lab=Lab collect   10/08/22 1026   10/09/22 0500  Phosphorus  Tomorrow morning,   R       Question:  Specimen collection method  Answer:  Lab=Lab collect   10/08/22 1026            Total time spent in review of labs and imaging, patient evaluation, formulation of plan, documentation and communication with family: 62 minutes  Signed, Terrilee Croak, MD [facility] 10/08/2022

## 2022-10-09 DIAGNOSIS — E870 Hyperosmolality and hypernatremia: Secondary | ICD-10-CM | POA: Diagnosis not present

## 2022-10-09 LAB — BASIC METABOLIC PANEL
Anion gap: 9 (ref 5–15)
BUN: 18 mg/dL (ref 8–23)
CO2: 29 mmol/L (ref 22–32)
Calcium: 8.4 mg/dL — ABNORMAL LOW (ref 8.9–10.3)
Chloride: 100 mmol/L (ref 98–111)
Creatinine, Ser: 0.47 mg/dL (ref 0.44–1.00)
GFR, Estimated: >60 mL/min (ref >60)
Glucose, Bld: 106 mg/dL — ABNORMAL HIGH (ref 70–99)
Potassium: 3.1 mmol/L — ABNORMAL LOW (ref 3.5–5.1)
Sodium: 138 mmol/L (ref 135–145)

## 2022-10-09 LAB — CBC WITH DIFFERENTIAL/PLATELET
Abs Immature Granulocytes: 0.06 10*3/uL (ref 0.00–0.07)
Basophils Absolute: 0 10*3/uL (ref 0.0–0.1)
Basophils Relative: 0 %
Eosinophils Absolute: 0 10*3/uL (ref 0.0–0.5)
Eosinophils Relative: 0 %
HCT: 27.1 % — ABNORMAL LOW (ref 36.0–46.0)
Hemoglobin: 8.3 g/dL — ABNORMAL LOW (ref 12.0–15.0)
Immature Granulocytes: 1 %
Lymphocytes Relative: 11 %
Lymphs Abs: 1.1 10*3/uL (ref 0.7–4.0)
MCH: 22.5 pg — ABNORMAL LOW (ref 26.0–34.0)
MCHC: 30.6 g/dL (ref 30.0–36.0)
MCV: 73.4 fL — ABNORMAL LOW (ref 80.0–100.0)
Monocytes Absolute: 0.4 10*3/uL (ref 0.1–1.0)
Monocytes Relative: 4 %
Neutro Abs: 8.4 10*3/uL — ABNORMAL HIGH (ref 1.7–7.7)
Neutrophils Relative %: 84 %
Platelets: 486 10*3/uL — ABNORMAL HIGH (ref 150–400)
RBC: 3.69 MIL/uL — ABNORMAL LOW (ref 3.87–5.11)
RDW: 18.8 % — ABNORMAL HIGH (ref 11.5–15.5)
WBC: 10.1 10*3/uL (ref 4.0–10.5)
nRBC: 0 % (ref 0.0–0.2)

## 2022-10-09 LAB — MAGNESIUM: Magnesium: 2.2 mg/dL (ref 1.7–2.4)

## 2022-10-09 LAB — PHOSPHORUS: Phosphorus: 3.3 mg/dL (ref 2.5–4.6)

## 2022-10-09 MED ORDER — POTASSIUM CHLORIDE 20 MEQ PO PACK
40.0000 meq | PACK | Freq: Once | ORAL | Status: AC
  Administered 2022-10-09: 40 meq via ORAL
  Filled 2022-10-09: qty 2

## 2022-10-09 NOTE — Progress Notes (Signed)
PROGRESS NOTE  Donna Simmons  DOB: 1941/03/02  PCP: [name], DO QIW:[id number]  DOA: 08/25/2022  LOS: 17 days  Hospital Day: 46  Brief narrative: Donna Simmons is a 82 y.o. female with PMH significant for Alzheimer's dementia, HTN, HLD, permanent A-fib on Eliquis, GERD. 12/25, patient was sent to ED from [facility] for poor oral intake, increased lethargy, failure to thrive. Workup showed hypernatremia of 167, AKI.  Urine culture grew ESBL E. coli. Patient completed the course of IV ertapenem.  She could not return to SNF because of unpaid bills.  Family unable to take care of her at home.  Medicaid pending. Difficult to place.  Subjective: Patient was seen and examined this morning.  Elderly African-American female.  Lying on bed.  Opens eyes on verbal command.  Able to answer simple yes no questions.  Demented Blood pressure in low normal range.  Assessment and plan: ESBL E. coli UTI Completed 5 days course of ertapenem   Hypernatremia Presented with sodium of 167 due to poor p.o. intake Started on hypotonic fluid.  Sodium level subsequently improved.  138 today. Recent Labs  Lab 10/09/22 0336  NA 138    Acute kidney injury Resolved Recent Labs    09/10/22 0217 09/15/22 0308 09/17/22 0325 09/22/22 0239 09/24/22 1159 09/27/22 0136 09/29/22 0303 09/30/22 0441 10/01/22 0248 10/09/22 0336  BUN 11 15 10 20 19 22  25* 15 13 18   CREATININE 0.57 0.56 0.59 0.53 0.64 0.59 0.58 0.50 0.49 0.47   Hypokalemia/hypophosphatemia Calcium low at 3.1 today.  Replacement ordered. Recent Labs  Lab 10/09/22 0336  K 3.1*  MG 2.2  PHOS 3.3    Chronic iron deficiency anemia Hemoglobin low but stable.  Continue iron supplement. Recent Labs    09/24/22 1159 09/27/22 0136 09/29/22 0303 10/01/22 0248 10/09/22 0336  HGB 8.1* 8.1* 8.6* 8.2* 8.3*  MCV 77.3* 76.1* 75.6* 74.1* 73.4*   Permanent atrial fibrillation Continue flecainide, apixaban Metoprolol on  hold due to soft blood pressure   Alzheimer's dementia Continue delirium precautions     Mobility: Encourage ambulation  Goals of care   Code Status: DNR     DVT prophylaxis:  SCDs Start: 08/25/22 0659 apixaban (ELIQUIS) tablet 5 mg   Antimicrobials: None Fluid: None Consultants: None Family Communication: None at bedside  Status is: Inpatient Level of care: Med-Surg   Dispo: Patient is from: SNF              Anticipated d/c is to: Difficult to place. Continue in-hospital care because: Pending placement.  Medically stable for discharge   Scheduled Meds:  apixaban  5 mg Oral BID   feeding supplement  237 mL Oral TID BM   ferrous sulfate  325 mg Oral QODAY   flecainide  50 mg Oral BID   leptospermum manuka honey  1 Application Topical Daily   multivitamin with minerals  1 tablet Oral Daily   pantoprazole  40 mg Oral Daily   potassium chloride  40 mEq Oral Once    PRN meds: acetaminophen **OR** acetaminophen, melatonin, ondansetron **OR** ondansetron (ZOFRAN) IV   Infusions:    Diet:  Diet Order             DIET - DYS 1 Room service appropriate? Yes; Fluid consistency: Thin  Diet effective now                   Antimicrobials: Anti-infectives (From admission, onward)    Start  Dose/Rate Route Frequency Ordered Stop   09/03/22 1000  ertapenem (INVANZ) 1,000 mg in sodium chloride 0.9 % 100 mL IVPB        1 g 200 mL/hr over 30 Minutes Intravenous Every 24 hours 09/02/22 1421 09/04/22 2048   08/30/22 1500  ertapenem (INVANZ) 1,000 mg in sodium chloride 0.9 % 100 mL IVPB  Status:  Discontinued        1 g 200 mL/hr over 30 Minutes Intravenous Every 24 hours 08/30/22 1419 09/02/22 1421   08/29/22 1115  cefTRIAXone (ROCEPHIN) 1 g in sodium chloride 0.9 % 100 mL IVPB  Status:  Discontinued        1 g 200 mL/hr over 30 Minutes Intravenous Every 24 hours 08/29/22 1017 08/30/22 1419       Skin assessment:  Pressure Injury 09/04/22 Sacrum Unstageable -  Full thickness tissue loss in which the base of the injury is covered by slough (yellow, tan, gray, green or brown) and/or eschar (tan, brown or black) in the wound bed. (Active)  09/04/22 1030  Location: Sacrum  Location Orientation:   Staging: Unstageable - Full thickness tissue loss in which the base of the injury is covered by slough (yellow, tan, gray, green or brown) and/or eschar (tan, brown or black) in the wound bed.  Wound Description (Comments):   Present on Admission: No     Pressure Injury 09/30/22 Hip Right Deep Tissue Pressure Injury - Purple or maroon localized area of discolored intact skin or blood-filled blister due to damage of underlying soft tissue from pressure and/or shear. 2 cms x 1 cm x 0.1 cms 80% red moist 20 (Active)  09/30/22 1200  Location: Hip  Location Orientation: Right  Staging: Deep Tissue Pressure Injury - Purple or maroon localized area of discolored intact skin or blood-filled blister due to damage of underlying soft tissue from pressure and/or shear.  Wound Description (Comments): 2 cms x 1 cm x 0.1 cms 80% red moist 20% yellow  Present on Admission: No      Nutritional status:  Body mass index is 26.33 kg/m.  Nutrition Problem: Severe Malnutrition Etiology: chronic illness Signs/Symptoms: severe muscle depletion, severe fat depletion     Objective: Vitals:   10/09/22 0503 10/09/22 0815  BP: (!) 106/59 101/62  Pulse: 75 82  Resp: 16 17  Temp: 98.1 F (36.7 C) 97.9 F (36.6 C)  SpO2: 95% 95%    Intake/Output Summary (Last 24 hours) at 10/09/2022 1143 Last data filed at 10/09/2022 0851 Gross per 24 hour  Intake 720 ml  Output --  Net 720 ml   Filed Weights   10/07/22 0500 10/08/22 0501 10/09/22 0503  Weight: 64.4 kg 62.3 kg 65.3 kg   Weight change: 3 kg Body mass index is 26.33 kg/m.   Physical Exam: General exam: Elderly African-American female.  Not in physical distress Skin: No rashes, lesions or ulcers. HEENT:  Atraumatic, normocephalic, no obvious bleeding Lungs: Clear to auscultation bilaterally CVS: Regular rate and rhythm, no murmur GI/Abd soft, nontender, nondistended, bowel sound present CNS: Alert, awake, able to answer yes/no questions. Psychiatry: Demented at baseline Extremities: No pedal edema, no calf tenderness  Data Review: I have personally reviewed the laboratory data and studies available.  F/u labs ordered Unresulted Labs (From admission, onward)     Start     Ordered   Pending  MRSA Next Gen by PCR, Nasal  Once,   R        Pending  Total time spent in review of labs and imaging, patient evaluation, formulation of plan, documentation and communication with family: 25 minutes  Signed, Terrilee Croak, MD [facility] 10/09/2022

## 2022-10-10 ENCOUNTER — Other Ambulatory Visit (HOSPITAL_COMMUNITY): Payer: Medicare PPO

## 2022-10-10 DIAGNOSIS — E870 Hyperosmolality and hypernatremia: Secondary | ICD-10-CM | POA: Diagnosis not present

## 2022-10-10 MED ORDER — METOPROLOL TARTRATE 12.5 MG HALF TABLET
12.5000 mg | ORAL_TABLET | Freq: Two times a day (BID) | ORAL | Status: DC
  Administered 2022-10-10: 12.5 mg via ORAL
  Filled 2022-10-10 (×2): qty 1

## 2022-10-10 NOTE — Progress Notes (Signed)
PROGRESS NOTE  Donna Simmons  DOB: [birth year]  PCP: [name], DO [id number]  DOA: 08/25/2022  LOS: 84 days  Hospital Day: 37  Brief narrative: Donna Simmons is a 82 y.o. female with PMH significant for Alzheimer's dementia, HTN, HLD, permanent A-fib on Eliquis, GERD. 12/25, patient was sent to ED from [facility] for poor oral intake, increased lethargy, failure to thrive. Workup showed hypernatremia of 167, AKI.  Urine culture grew ESBL E. coli. Patient completed the course of IV ertapenem.  She could not return to SNF because of unpaid bills.  Family unable to take care of her at home.  Medicaid pending. Difficult to place.  Subjective: Patient was seen and examined this morning.   Not in distress.  Tachycardic since last night up to 130s.  Blood pressure in low normal range.    Assessment and plan: Sinus tachycardia Heart rate elevated since last night, as high as 130s.  Blood pressure stable.  Not going through any active infection Started on telemetry monitoring. I will start on metoprolol 12.5 mg twice daily.  Obtain echocardiogram.  ESBL E. coli UTI Completed 5 days course of ertapenem   Hypernatremia Presented with sodium of 167 due to poor p.o. intake Started on hypotonic fluid.  Sodium level subsequently improved.  138 on last check on 2/8. Recent Labs  Lab 10/09/22 0336  NA 138   Acute kidney injury Resolved Recent Labs    09/10/22 0217 09/15/22 0308 09/17/22 0325 09/22/22 0239 09/24/22 1159 09/27/22 0136 09/29/22 0303 09/30/22 0441 10/01/22 0248 10/09/22 0336  BUN 11 15 10 20 19 22 $ 25* 15 13 18  $ CREATININE 0.57 0.56 0.59 0.53 0.64 0.59 0.58 0.50 0.49 0.47   Hypokalemia/hypophosphatemia Calcium low at 3.1 on last check on 2/8.Marland Kitchen  Replacement ordered. Recent Labs  Lab 10/09/22 0336  K 3.1*  MG 2.2  PHOS 3.3    Chronic iron deficiency anemia Hemoglobin low but stable.  Continue iron supplement. Recent Labs    09/24/22 1159  09/27/22 0136 09/29/22 0303 10/01/22 0248 10/09/22 0336  HGB 8.1* 8.1* 8.6* 8.2* 8.3*  MCV 77.3* 76.1* 75.6* 74.1* 73.4*   Permanent atrial fibrillation Continue flecainide, apixaban Metoprolol on hold due to soft blood pressure   Alzheimer's dementia Continue delirium precautions     Mobility: Encourage ambulation  Goals of care   Code Status: DNR     DVT prophylaxis:  SCDs Start: 08/25/22 0659 apixaban (ELIQUIS) tablet 5 mg   Antimicrobials: None Fluid: None Consultants: None Family Communication: None at bedside  Status is: Inpatient Level of care: Med-Surg   Dispo: Patient is from: SNF              Anticipated d/c is to: Difficult to place. Continue in-hospital care because: Pending placement.   Scheduled Meds:  apixaban  5 mg Oral BID   feeding supplement  237 mL Oral TID BM   ferrous sulfate  325 mg Oral QODAY   flecainide  50 mg Oral BID   leptospermum manuka honey  1 Application Topical Daily   metoprolol tartrate  12.5 mg Oral BID   multivitamin with minerals  1 tablet Oral Daily   pantoprazole  40 mg Oral Daily    PRN meds: acetaminophen **OR** acetaminophen, melatonin, ondansetron **OR** ondansetron (ZOFRAN) IV   Infusions:    Diet:  Diet Order             DIET - DYS 1 Room service appropriate? Yes; Fluid consistency: Thin  Diet effective now                   Antimicrobials: Anti-infectives (From admission, onward)    Start     Dose/Rate Route Frequency Ordered Stop   09/03/22 1000  ertapenem (INVANZ) 1,000 mg in sodium chloride 0.9 % 100 mL IVPB        1 g 200 mL/hr over 30 Minutes Intravenous Every 24 hours 09/02/22 1421 09/04/22 2048   08/30/22 1500  ertapenem (INVANZ) 1,000 mg in sodium chloride 0.9 % 100 mL IVPB  Status:  Discontinued        1 g 200 mL/hr over 30 Minutes Intravenous Every 24 hours 08/30/22 1419 09/02/22 1421   08/29/22 1115  cefTRIAXone (ROCEPHIN) 1 g in sodium chloride 0.9 % 100 mL IVPB  Status:   Discontinued        1 g 200 mL/hr over 30 Minutes Intravenous Every 24 hours 08/29/22 1017 08/30/22 1419       Skin assessment:  Pressure Injury 09/04/22 Sacrum Unstageable - Full thickness tissue loss in which the base of the injury is covered by slough (yellow, tan, gray, green or brown) and/or eschar (tan, brown or black) in the wound bed. (Active)  09/04/22 1030  Location: Sacrum  Location Orientation:   Staging: Unstageable - Full thickness tissue loss in which the base of the injury is covered by slough (yellow, tan, gray, green or brown) and/or eschar (tan, brown or black) in the wound bed.  Wound Description (Comments):   Present on Admission: No     Pressure Injury 09/30/22 Hip Right Deep Tissue Pressure Injury - Purple or maroon localized area of discolored intact skin or blood-filled blister due to damage of underlying soft tissue from pressure and/or shear. 2 cms x 1 cm x 0.1 cms 80% red moist 20 (Active)  09/30/22 1200  Location: Hip  Location Orientation: Right  Staging: Deep Tissue Pressure Injury - Purple or maroon localized area of discolored intact skin or blood-filled blister due to damage of underlying soft tissue from pressure and/or shear.  Wound Description (Comments): 2 cms x 1 cm x 0.1 cms 80% red moist 20% yellow  Present on Admission: No      Nutritional status:  Body mass index is 26.33 kg/m.  Nutrition Problem: Severe Malnutrition Etiology: chronic illness Signs/Symptoms: severe muscle depletion, severe fat depletion     Objective: Vitals:   10/10/22 0747 10/10/22 1229  BP: 104/74 (!) 98/59  Pulse: (!) 129 (!) 103  Resp: 18 18  Temp: 98.4 F (36.9 C) 98.1 F (36.7 C)  SpO2: 98% 99%    Intake/Output Summary (Last 24 hours) at 10/10/2022 1338 Last data filed at 10/10/2022 1004 Gross per 24 hour  Intake 1080 ml  Output 200 ml  Net 880 ml   Filed Weights   10/08/22 0501 10/09/22 0503 10/10/22 0500  Weight: 62.3 kg 65.3 kg 65.3 kg    Weight change: 0 kg Body mass index is 26.33 kg/m.   Physical Exam: General exam: Elderly African-American female.  Not in physical distress Skin: No rashes, lesions or ulcers. HEENT: Atraumatic, normocephalic, no obvious bleeding Lungs: Clear to auscultation bilaterally CVS: Regular rhythm, tachycardic, no murmur GI/Abd soft, nontender, nondistended, bowel sound present CNS: Alert, awake, able to answer yes/no questions. Psychiatry: Demented at baseline Extremities: No pedal edema, no calf tenderness  Data Review: I have personally reviewed the laboratory data and studies available.  F/u labs ordered Unresulted Labs (From admission, onward)  None       Total time spent in review of labs and imaging, patient evaluation, formulation of plan, documentation and communication with family: 67 minutes  Signed, Terrilee Croak, MD [facility] 10/10/2022

## 2022-10-10 NOTE — TOC Progression Note (Signed)
Transition of Care [facility]) - Progression Note    Patient Details  Name: Donna Simmons MRN: [id number] Date of Birth: February 06, 1941  Transition of Care [facility]) CM/SW [place], [place] Phone Number: 10/10/2022, 12:55 PM  Clinical Narrative:     TOC continuing to follow for pending Medicaid application required for placement. Spouse reports no new updates, waiting for application to be reviewed. TOC will continue to follow for placement needs.       Expected Discharge Plan and Services                                               Social Determinants of Health (SDOH) Interventions SDOH Screenings   Food Insecurity: Unknown (07/03/2022)  Alcohol Screen: Low Risk  (04/12/2019)  Depression (PHQ2-9): Low Risk  (11/05/2020)  Tobacco Use: Low Risk  (08/20/2022)    Readmission Risk Interventions     No data to display

## 2022-10-11 ENCOUNTER — Inpatient Hospital Stay (HOSPITAL_COMMUNITY): Payer: Medicare PPO

## 2022-10-11 DIAGNOSIS — N179 Acute kidney failure, unspecified: Secondary | ICD-10-CM | POA: Diagnosis not present

## 2022-10-11 DIAGNOSIS — E876 Hypokalemia: Secondary | ICD-10-CM | POA: Diagnosis not present

## 2022-10-11 DIAGNOSIS — R9431 Abnormal electrocardiogram [ECG] [EKG]: Secondary | ICD-10-CM | POA: Diagnosis not present

## 2022-10-11 DIAGNOSIS — R7309 Other abnormal glucose: Secondary | ICD-10-CM

## 2022-10-11 DIAGNOSIS — E870 Hyperosmolality and hypernatremia: Secondary | ICD-10-CM | POA: Diagnosis not present

## 2022-10-11 LAB — ECHOCARDIOGRAM COMPLETE
Area-P 1/2: 4.06 cm2
Height: 62 in
MV M vel: 2.67 m/s
MV Peak grad: 28.5 mmHg
P 1/2 time: 399 msec
S' Lateral: 2.7 cm
Weight: 2377.44 oz

## 2022-10-11 NOTE — Progress Notes (Signed)
[facility]                                                                              Donna Simmons, is a 82 y.o. female, DOB - October 27, 1940, [id number] Admit date - 08/25/2022    Outpatient Primary MD for the patient is [name], DO  LOS - 16  days  Chief Complaint  Patient presents with   Failure To Thrive       Brief summary   Donna Simmons is a 82 y.o. female with PMH significant for Alzheimer's dementia, HTN, HLD, permanent A-fib on Eliquis, GERD. 12/25, patient was sent to ED from [facility] for poor oral intake, increased lethargy, failure to thrive. Workup showed hypernatremia of 167, AKI.  Urine culture grew ESBL E. coli. Patient completed the course of IV ertapenem.  She could not return to SNF because of unpaid bills.  Family unable to take care of her at home.  Medicaid pending. Difficult to place, TOC assisting with placement.   Assessment & Plan   Sinus tachycardia -Heart rate now controlled - BP soft, asymptomatic, will hold beta-blocker, continue flecainide -Follow 2D echo, TSH    ESBL E. coli UTI Completed 5 days course of ertapenem   Hypernatremia Presented with sodium of 167 due to poor p.o. intake -Patient was placed on hypotonic fluid, sodium has subsequently improved -Sodium 138 on 2/8   Acute kidney injury -Resolved, creatinine 1.19 on admission -Creatinine 0.4   Hypokalemia/hypophosphatemia -Recheck in a.m.     Chronic iron deficiency anemia -H&H currently stable, currently presents supplementation   Permanent atrial fibrillation -Heart rate controlled, continue flecainide, apixaban -Metoprolol placed on hold due to soft BP    Alzheimer's dementia Continue delirium precautions  Pressure injury documentation -Sacrum unstageable -Right hip deep tissue pressure injury -Wound care per nursing  Severe protein calorie malnutrition Etiology: chronic illness Signs/Symptoms: severe muscle depletion,  severe fat depletion Interventions: Ensure Enlive (each supplement provides 350kcal and 20 grams of protein), MVI Estimated body mass index is 27.18 kg/m as calculated from the following:   Height as of this encounter: 5' 2"$  (1.575 m).   Weight as of this encounter: 67.4 kg.  Code Status: DNR DVT Prophylaxis:  SCDs Start: 08/25/22 0659 apixaban (ELIQUIS) tablet 5 mg   Level of Care: Level of care: Med-Surg Family Communication:  Disposition Plan:      Remains inpatient appropriate: Awaiting placement at   Procedures:    Consultants:     Antimicrobials:   Anti-infectives (From admission, onward)    Start     Dose/Rate Route Frequency Ordered Stop   09/03/22 1000  ertapenem (INVANZ) 1,000 mg in sodium chloride 0.9 % 100 mL IVPB        1 g 200 mL/hr over 30 Minutes Intravenous Every 24 hours 09/02/22 1421 09/04/22 2048   08/30/22 1500  ertapenem (INVANZ) 1,000 mg in sodium chloride 0.9 % 100 mL IVPB  Status:  Discontinued        1 g 200 mL/hr over 30 Minutes Intravenous Every 24 hours 08/30/22 1419 09/02/22 1421   08/29/22 1115  cefTRIAXone (ROCEPHIN) 1 g  in sodium chloride 0.9 % 100 mL IVPB  Status:  Discontinued        1 g 200 mL/hr over 30 Minutes Intravenous Every 24 hours 08/29/22 1017 08/30/22 1419          Medications  apixaban  5 mg Oral BID   feeding supplement  237 mL Oral TID BM   ferrous sulfate  325 mg Oral QODAY   flecainide  50 mg Oral BID   leptospermum manuka honey  1 Application Topical Daily   metoprolol tartrate  12.5 mg Oral BID   multivitamin with minerals  1 tablet Oral Daily   pantoprazole  40 mg Oral Daily      Subjective:   [name] was seen and examined today.  No acute complaints.  Heart rate now controlled.  BP soft.,  No dizziness, lightheadedness   Objective:   Vitals:   10/11/22 0030 10/11/22 0423 10/11/22 0755 10/11/22 0800  BP: (!) 95/57 (!) 87/64  (!) 89/65  Pulse: 90 82  83  Resp:  15  14  Temp: 98.2 F (36.8 C)  98.2 F (36.8 C) (P) 98.4 F (36.9 C) 98.4 F (36.9 C)  TempSrc: Axillary Oral (P) Oral   SpO2: 99% 98%    Weight:  67.4 kg    Height:        Intake/Output Summary (Last 24 hours) at 10/11/2022 1305 Last data filed at 10/11/2022 1225 Gross per 24 hour  Intake 1040 ml  Output 500 ml  Net 540 ml     Wt Readings from Last 3 Encounters:  10/11/22 67.4 kg  07/03/22 50.2 kg  04/24/22 54.4 kg     Exam General: Oriented to self, NAD, ill-appearing Cardiovascular: S1 S2 auscultated,  RRR Respiratory: Clear to auscultation bilaterally, no wheezing Gastrointestinal: Soft, nontender, nondistended, + bowel sounds Ext: no pedal edema bilaterally Neuro: moving all 4 extremities Psych: dementia at baseline    Data Reviewed:  I have personally reviewed following labs    CBC Lab Results  Component Value Date   WBC 10.1 10/09/2022   RBC 3.69 (L) 10/09/2022   HGB 8.3 (L) 10/09/2022   HCT 27.1 (L) 10/09/2022   MCV 73.4 (L) 10/09/2022   MCH 22.5 (L) 10/09/2022   PLT 486 (H) 10/09/2022   MCHC 30.6 10/09/2022   RDW 18.8 (H) 10/09/2022   LYMPHSABS 1.1 10/09/2022   MONOABS 0.4 10/09/2022   EOSABS 0.0 10/09/2022   BASOSABS 0.0 [id number]     Last metabolic panel Lab Results  Component Value Date   NA 138 10/09/2022   K 3.1 (L) 10/09/2022   CL 100 10/09/2022   CO2 29 10/09/2022   BUN 18 10/09/2022   CREATININE 0.47 10/09/2022   GLUCOSE 106 (H) 10/09/2022   GFRNONAA >60 10/09/2022   GFRAA 62 04/04/2020   CALCIUM 8.4 (L) 10/09/2022   PHOS 3.3 10/09/2022   PROT 5.8 (L) 10/01/2022   ALBUMIN 1.9 (L) 10/01/2022   LABGLOB 2.6 04/04/2020   AGRATIO 1.7 04/04/2020   BILITOT 0.4 10/01/2022   ALKPHOS 70 10/01/2022   AST 31 10/01/2022   ALT 32 10/01/2022   ANIONGAP 9 10/09/2022    CBG (last 3)  No results for input(s): "GLUCAP" in the last 72 hours.    Coagulation Profile: No results for input(s): "INR", "PROTIME" in the last 168 hours.   Radiology Studies: I have  personally reviewed the imaging studies  No results found.     Estill Cotta M.D. [facility] 10/11/2022,  1:05 PM  Available via Epic secure chat 7am-7pm After 7 pm, please refer to night coverage provider listed on amion.

## 2022-10-11 NOTE — Progress Notes (Signed)
*  PRELIMINARY RESULTS* Echocardiogram 2D Echocardiogram has been performed.  Wynelle Link 10/11/2022, 9:41 AM

## 2022-10-12 DIAGNOSIS — R7309 Other abnormal glucose: Secondary | ICD-10-CM | POA: Diagnosis not present

## 2022-10-12 DIAGNOSIS — E870 Hyperosmolality and hypernatremia: Secondary | ICD-10-CM | POA: Diagnosis not present

## 2022-10-12 DIAGNOSIS — N179 Acute kidney failure, unspecified: Secondary | ICD-10-CM | POA: Diagnosis not present

## 2022-10-12 DIAGNOSIS — E876 Hypokalemia: Secondary | ICD-10-CM | POA: Diagnosis not present

## 2022-10-12 LAB — BASIC METABOLIC PANEL
Anion gap: 11 (ref 5–15)
BUN: 22 mg/dL (ref 8–23)
CO2: 26 mmol/L (ref 22–32)
Calcium: 8.3 mg/dL — ABNORMAL LOW (ref 8.9–10.3)
Chloride: 104 mmol/L (ref 98–111)
Creatinine, Ser: 0.48 mg/dL (ref 0.44–1.00)
GFR, Estimated: >60 mL/min (ref >60)
Glucose, Bld: 105 mg/dL — ABNORMAL HIGH (ref 70–99)
Potassium: 3.2 mmol/L — ABNORMAL LOW (ref 3.5–5.1)
Sodium: 141 mmol/L (ref 135–145)

## 2022-10-12 LAB — TSH: TSH: 1.706 u[IU]/mL (ref 0.350–4.500)

## 2022-10-12 LAB — MAGNESIUM: Magnesium: 2 mg/dL (ref 1.7–2.4)

## 2022-10-12 LAB — PHOSPHORUS: Phosphorus: 3.1 mg/dL (ref 2.5–4.6)

## 2022-10-12 MED ORDER — POTASSIUM CHLORIDE CRYS ER 20 MEQ PO TBCR
40.0000 meq | EXTENDED_RELEASE_TABLET | Freq: Once | ORAL | Status: AC
  Administered 2022-10-12: 40 meq via ORAL
  Filled 2022-10-12: qty 2

## 2022-10-12 NOTE — Progress Notes (Addendum)
[facility]                                                                              Donna Simmons, is a 82 y.o. female, DOB - [birth year], [id number] Admit date - 08/25/2022    Outpatient Primary MD for the patient is Donna Oman, DO  LOS - 61  days  Chief Complaint  Patient presents with   Failure To Thrive       Brief summary   Donna Simmons is a 82 y.o. female with PMH significant for Alzheimer's dementia, HTN, HLD, permanent A-fib on Eliquis, GERD. 12/25, patient was sent to ED from [facility] for poor oral intake, increased lethargy, failure to thrive. Workup showed hypernatremia of 167, AKI.  Urine culture grew ESBL E. coli. Patient completed the course of IV ertapenem.  She could not return to SNF because of unpaid bills.  Family unable to take care of her at home.  Medicaid pending. Difficult to place, TOC assisting with placement.  Medically stable, awaiting placement   Assessment & Plan   Sinus tachycardia -Heart rate now controlled -BP stable, continue flecainide, hold beta-blocker  -TSH 1.7 -2D echo showed EF 50 to 55%, G1 DD, mild to moderate AR, mild MR    ESBL E. coli UTI Completed 5 days course of ertapenem   Hypernatremia Presented with sodium of 167 due to poor p.o. intake -Patient was placed on hypotonic fluid, sodium has subsequently improved -Resolved, sodium 141  Hypokalemia -Replaced   Acute kidney injury -Resolved, creatinine 1.19 on admission -Creatinine 0.4      Chronic iron deficiency anemia -H&H currently stable, currently presents supplementation   Permanent atrial fibrillation -Heart rate controlled, continue flecainide, apixaban -Metoprolol placed on hold due to soft BP    Alzheimer's dementia Continue delirium precautions  Pressure injury documentation -Sacrum unstageable -Right hip deep tissue pressure injury -Wound care per nursing  Severe protein calorie malnutrition Etiology:  chronic illness Signs/Symptoms: severe muscle depletion, severe fat depletion Interventions: Ensure Enlive (each supplement provides 350kcal and 20 grams of protein), MVI Estimated body mass index is 25.48 kg/m as calculated from the following:   Height as of this encounter: 5' 2"$  (1.575 m).   Weight as of this encounter: 63.2 kg.  Code Status: DNR DVT Prophylaxis:  SCDs Start: 08/25/22 0659 apixaban (ELIQUIS) tablet 5 mg   Level of Care: Level of care: Med-Surg Family Communication: Updated patient's husband at the bedside today Disposition Plan:      Remains inpatient appropriate: Awaiting placement    Procedures:    Consultants:     Antimicrobials:   Anti-infectives (From admission, onward)    Start     Dose/Rate Route Frequency Ordered Stop   09/03/22 1000  ertapenem (INVANZ) 1,000 mg in sodium chloride 0.9 % 100 mL IVPB        1 g 200 mL/hr over 30 Minutes Intravenous Every 24 hours 09/02/22 1421 09/04/22 2048   08/30/22 1500  ertapenem (INVANZ) 1,000 mg in sodium chloride 0.9 % 100 mL IVPB  Status:  Discontinued        1 g 200 mL/hr over  30 Minutes Intravenous Every 24 hours 08/30/22 1419 09/02/22 1421   08/29/22 1115  cefTRIAXone (ROCEPHIN) 1 g in sodium chloride 0.9 % 100 mL IVPB  Status:  Discontinued        1 g 200 mL/hr over 30 Minutes Intravenous Every 24 hours 08/29/22 1017 08/30/22 1419          Medications  apixaban  5 mg Oral BID   feeding supplement  237 mL Oral TID BM   ferrous sulfate  325 mg Oral QODAY   flecainide  50 mg Oral BID   leptospermum manuka honey  1 Application Topical Daily   multivitamin with minerals  1 tablet Oral Daily   pantoprazole  40 mg Oral Daily      Subjective:   Geneve Pellicane was seen and examined today.  No acute complaints, heart rate controlled, BP stable.  Husband at the bedside.   Objective:   Vitals:   10/11/22 1621 10/11/22 2008 10/12/22 0451 10/12/22 0657  BP: 103/64 107/71  113/65  Pulse: 94 86  97   Resp: 15 20  20  $ Temp: 98.2 F (36.8 C) 99.5 F (37.5 C)  98 F (36.7 C)  TempSrc:  Axillary  Axillary  SpO2: 100%   98%  Weight:   63.2 kg   Height:        Intake/Output Summary (Last 24 hours) at 10/12/2022 1245 Last data filed at 10/11/2022 2330 Gross per 24 hour  Intake 240 ml  Output --  Net 240 ml     Wt Readings from Last 3 Encounters:  10/12/22 63.2 kg  07/03/22 50.2 kg  04/24/22 54.4 kg   Physical Exam General: Pleasant, oriented to self, NAD Cardiovascular: S1 S2 clear, RRR.  Respiratory: CTAB, no wheezing Gastrointestinal: Soft, nontender, nondistended, NBS Ext: no pedal edema bilaterally Neuro: no new deficits Psych: dementia   Data Reviewed:  I have personally reviewed following labs    CBC Lab Results  Component Value Date   WBC 10.1 10/09/2022   RBC 3.69 (L) 10/09/2022   HGB 8.3 (L) 10/09/2022   HCT 27.1 (L) 10/09/2022   MCV 73.4 (L) 10/09/2022   MCH 22.5 (L) 10/09/2022   PLT 486 (H) 10/09/2022   MCHC 30.6 10/09/2022   RDW 18.8 (H) 10/09/2022   LYMPHSABS 1.1 10/09/2022   MONOABS 0.4 10/09/2022   EOSABS 0.0 10/09/2022   BASOSABS 0.0 [id number]     Last metabolic panel Lab Results  Component Value Date   NA 141 10/12/2022   K 3.2 (L) 10/12/2022   CL 104 10/12/2022   CO2 26 10/12/2022   BUN 22 10/12/2022   CREATININE 0.48 10/12/2022   GLUCOSE 105 (H) 10/12/2022   GFRNONAA >60 10/12/2022   GFRAA 62 04/04/2020   CALCIUM 8.3 (L) 10/12/2022   PHOS 3.1 10/12/2022   PROT 5.8 (L) 10/01/2022   ALBUMIN 1.9 (L) 10/01/2022   LABGLOB 2.6 04/04/2020   AGRATIO 1.7 04/04/2020   BILITOT 0.4 10/01/2022   ALKPHOS 70 10/01/2022   AST 31 10/01/2022   ALT 32 10/01/2022   ANIONGAP 11 10/12/2022    CBG (last 3)  No results for input(s): "GLUCAP" in the last 72 hours.    Coagulation Profile: No results for input(s): "INR", "PROTIME" in the last 168 hours.   Radiology Studies: I have personally reviewed the imaging studies  ECHOCARDIOGRAM  COMPLETE  Result Date: 10/11/2022    ECHOCARDIOGRAM REPORT   Patient Name:   Donna Simmons Date of Exam: 10/11/2022  Medical Rec #:  [id number]       Height:       62.0 in Accession #:    [id number]      Weight:       148.6 lb Date of Birth:  06-08-1941      BSA:          1.685 m Patient Age:    6 years        BP:           89/65 mmHg Patient Gender: F               HR:           86 bpm. Exam Location:  Inpatient Procedure: 2D Echo, Cardiac Doppler and Color Doppler Indications:    Abnormal ECG 794.31 / R94.31  History:        Patient has prior history of Echocardiogram examinations, most                 recent 07/25/2018. Pulmonary HTN, Arrythmias:Atrial                 Fibrillation, Signs/Symptoms:Altered Mental Status; Risk                 Factors:Hypertension and Dyslipidemia.  Sonographer:    Greer Pickerel Referring Phys: [id number] [facility]  Sonographer Comments: No subcostal window. Image acquisition challenging due to patient body habitus and Image acquisition challenging due to respiratory motion. IMPRESSIONS  1. Left ventricular ejection fraction, by estimation, is 50 to 55%. The left ventricle has low normal function. Left ventricular endocardial border not optimally defined to evaluate regional wall motion. Left ventricular diastolic parameters are consistent with Grade I diastolic dysfunction (impaired relaxation).  2. Right ventricular systolic function was not well visualized. The right ventricular size is mildly enlarged. Tricuspid regurgitation signal is inadequate for assessing PA pressure.  3. The mitral valve is normal in structure. Mild mitral valve regurgitation. No evidence of mitral stenosis.  4. The aortic valve is tricuspid. Aortic valve regurgitation is mild to moderate. No aortic stenosis is present. Aortic regurgitation PHT measures 399 msec.  5. Aortic dilatation noted. There is borderline dilatation of the aortic root, measuring 38 mm. Comparison(s): No prior Echocardiogram.  FINDINGS  Left Ventricle: Left ventricular ejection fraction, by estimation, is 50 to 55%. The left ventricle has low normal function. Left ventricular endocardial border not optimally defined to evaluate regional wall motion. The left ventricular internal cavity  size was normal in size. There is no left ventricular hypertrophy. Left ventricular diastolic parameters are consistent with Grade I diastolic dysfunction (impaired relaxation). Right Ventricle: The right ventricular size is mildly enlarged. Right vetricular wall thickness was not well visualized. Right ventricular systolic function was not well visualized. Tricuspid regurgitation signal is inadequate for assessing PA pressure. Left Atrium: Left atrial size was normal in size. Right Atrium: Right atrial size was normal in size. Pericardium: There is no evidence of pericardial effusion. Mitral Valve: The mitral valve is normal in structure. Mild mitral valve regurgitation. No evidence of mitral valve stenosis. Tricuspid Valve: The tricuspid valve is normal in structure. Tricuspid valve regurgitation is not demonstrated. No evidence of tricuspid stenosis. Aortic Valve: The aortic valve is tricuspid. Aortic valve regurgitation is mild to moderate. Aortic regurgitation PHT measures 399 msec. No aortic stenosis is present. Pulmonic Valve: The pulmonic valve was not well visualized. Pulmonic valve regurgitation is not visualized. No evidence of pulmonic stenosis. Aorta: Aortic dilatation noted. There is  borderline dilatation of the aortic root, measuring 38 mm. Venous: The inferior vena cava was not well visualized. IAS/Shunts: The interatrial septum was not assessed.  LEFT VENTRICLE PLAX 2D LVIDd:         3.70 cm   Diastology LVIDs:         2.70 cm   LV e' medial:    7.94 cm/s LV PW:         0.90 cm   LV E/e' medial:  7.4 LV IVS:        0.70 cm   LV e' lateral:   13.10 cm/s LVOT diam:     1.80 cm   LV E/e' lateral: 4.5 LV SV:         50 LV SV Index:   30 LVOT  Area:     2.54 cm  RIGHT VENTRICLE RV S prime:     9.64 cm/s TAPSE (M-mode): 0.9 cm LEFT ATRIUM             Index        RIGHT ATRIUM           Index LA diam:        3.20 cm 1.90 cm/m   RA Area:     11.90 cm LA Vol (A2C):   31.8 ml 18.87 ml/m  RA Volume:   24.00 ml  14.24 ml/m LA Vol (A4C):   24.6 ml 14.60 ml/m LA Biplane Vol: 28.7 ml 17.03 ml/m  AORTIC VALVE LVOT Vmax:   113.00 cm/s LVOT Vmean:  75.800 cm/s LVOT VTI:    0.196 m AI PHT:      399 msec  AORTA Ao Root diam: 3.80 cm Ao Asc diam:  3.50 cm MITRAL VALVE MV Area (PHT): 4.06 cm    SHUNTS MV Decel Time: 187 msec    Systemic VTI:  0.20 m MR Peak grad: 28.5 mmHg    Systemic Diam: 1.80 cm MR Vmax:      267.00 cm/s MV E velocity: 59.00 cm/s MV A velocity: 82.30 cm/s MV E/A ratio:  0.72 Vishnu Priya Mallipeddi Electronically signed by Lorelee Cover Mallipeddi Signature Date/Time: 10/11/2022/1:55:00 PM    Final        Estill Cotta M.D. [facility] 10/12/2022, 12:45 PM  Available via Epic secure chat 7am-7pm After 7 pm, please refer to night coverage provider listed on amion.

## 2022-10-12 NOTE — Plan of Care (Signed)

## 2022-10-13 DIAGNOSIS — N179 Acute kidney failure, unspecified: Secondary | ICD-10-CM | POA: Diagnosis not present

## 2022-10-13 DIAGNOSIS — R7309 Other abnormal glucose: Secondary | ICD-10-CM | POA: Diagnosis not present

## 2022-10-13 DIAGNOSIS — E876 Hypokalemia: Secondary | ICD-10-CM | POA: Diagnosis not present

## 2022-10-13 DIAGNOSIS — E870 Hyperosmolality and hypernatremia: Secondary | ICD-10-CM | POA: Diagnosis not present

## 2022-10-13 NOTE — Plan of Care (Signed)

## 2022-10-13 NOTE — Progress Notes (Addendum)
[facility]                                                                              Donna Simmons, is a 82 y.o. female, DOB - Sep 09, 1940, [id number] Admit date - 08/25/2022    Outpatient Primary MD for the patient is [name], DO  LOS - 48  days  Chief Complaint  Patient presents with   Failure To Thrive       Brief summary   Donna Simmons is a 82 y.o. female with PMH significant for Alzheimer's dementia, HTN, HLD, permanent A-fib on Eliquis, GERD. 12/25, patient was sent to ED from [facility] for poor oral intake, increased lethargy, failure to thrive. Workup showed hypernatremia of 167, AKI.  Urine culture grew ESBL E. coli. Patient completed the course of IV ertapenem.  She could not return to SNF because of unpaid bills.  Family unable to take care of her at home.  Medicaid pending. Difficult to place, TOC assisting with placement.  Medically stable, awaiting placement   Assessment & Plan   Sinus tachycardia -Heart rate now controlled -BP stable, continue flecainide, hold beta-blocker  -TSH 1.7 -2D echo showed EF 50 to 55%, G1 DD, mild to moderate AR, mild MR -No acute issues    ESBL E. coli UTI Completed 5 days course of ertapenem   Hypernatremia Presented with sodium of 167 due to poor p.o. intake -Patient was placed on hypotonic fluid, sodium has subsequently improved -Resolved, sodium 141  Hypokalemia -Replaced on 2/11   Acute kidney injury -Resolved, creatinine 1.19 on admission -Creatinine 0.4     Chronic iron deficiency anemia -H&H currently stable   Permanent atrial fibrillation -Heart rate controlled, continue flecainide, apixaban -Metoprolol placed on hold due to soft BP    Alzheimer's dementia Continue delirium precautions  Pressure injury documentation -Sacrum unstageable -Right hip deep tissue pressure injury -Wound care per nursing, air mattress.  Severe protein calorie malnutrition Etiology:  chronic illness Signs/Symptoms: severe muscle depletion, severe fat depletion Interventions: Ensure Enlive (each supplement provides 350kcal and 20 grams of protein), MVI Estimated body mass index is 25.48 kg/m as calculated from the following:   Height as of this encounter: 5\' 2"  (1.575 m).   Weight as of this encounter: 63.2 kg.  Code Status: DNR DVT Prophylaxis:  SCDs Start: 08/25/22 0659 apixaban (ELIQUIS) tablet 5 mg   Level of Care: Level of care: Med-Surg Family Communication: Updated patient's husband at the bedside on 10/12/2022 Disposition Plan:      Remains inpatient appropriate: No acute issues, awaiting placement   Procedures:    Consultants:     Antimicrobials:   Anti-infectives (From admission, onward)    Start     Dose/Rate Route Frequency Ordered Stop   09/03/22 1000  ertapenem (INVANZ) 1,000 mg in sodium chloride 0.9 % 100 mL IVPB        1 g 200 mL/hr over 30 Minutes Intravenous Every 24 hours 09/02/22 1421 09/04/22 2048   08/30/22 1500  ertapenem (INVANZ) 1,000 mg in sodium chloride 0.9 % 100 mL IVPB  Status:  Discontinued  1 g 200 mL/hr over 30 Minutes Intravenous Every 24 hours 08/30/22 1419 09/02/22 1421   08/29/22 1115  cefTRIAXone (ROCEPHIN) 1 g in sodium chloride 0.9 % 100 mL IVPB  Status:  Discontinued        1 g 200 mL/hr over 30 Minutes Intravenous Every 24 hours 08/29/22 1017 08/30/22 1419          Medications  apixaban  5 mg Oral BID   feeding supplement  237 mL Oral TID BM   ferrous sulfate  325 mg Oral QODAY   flecainide  50 mg Oral BID   leptospermum manuka honey  1 Application Topical Daily   multivitamin with minerals  1 tablet Oral Daily   pantoprazole  40 mg Oral Daily      Subjective:   [name] was seen and examined today.  No acute complaints, no overnight issues.  Awaiting placement.  Mattress changed.  Objective:   Vitals:   10/12/22 1547 10/12/22 2004 10/13/22 0512 10/13/22 0741  BP: 103/62 104/65 (!)  94/54 95/61  Pulse: 100 (!) 109 73 89  Resp: 18 20  16   Temp: 98.2 F (36.8 C) 99 F (37.2 C) 99.5 F (37.5 C) 98.5 F (36.9 C)  TempSrc: Axillary Oral Oral Oral  SpO2: 100% 99% 94% 99%  Weight:      Height:       No intake or output data in the 24 hours ending 10/13/22 1525    Wt Readings from Last 3 Encounters:  10/12/22 63.2 kg  07/03/22 50.2 kg  04/24/22 54.4 kg    Physical Exam General: Alert and awake, NAD, appears comfortable Cardiovascular: S1 S2 clear, RRR.  Respiratory: CTAB, no wheezing, rales or rhonchi Gastrointestinal: Soft, nontender, nondistended, NBS Ext: no pedal edema bilaterally Skin: Sacral and right hip pressure wound, unstageable Psych: dementia   Data Reviewed:  I have personally reviewed following labs    CBC Lab Results  Component Value Date   WBC 10.1 10/09/2022   RBC 3.69 (L) 10/09/2022   HGB 8.3 (L) 10/09/2022   HCT 27.1 (L) 10/09/2022   MCV 73.4 (L) 10/09/2022   MCH 22.5 (L) 10/09/2022   PLT 486 (H) 10/09/2022   MCHC 30.6 10/09/2022   RDW 18.8 (H) 10/09/2022   LYMPHSABS 1.1 10/09/2022   MONOABS 0.4 10/09/2022   EOSABS 0.0 10/09/2022   BASOSABS 0.0 24/23/5361     Last metabolic panel Lab Results  Component Value Date   NA 141 10/12/2022   K 3.2 (L) 10/12/2022   CL 104 10/12/2022   CO2 26 10/12/2022   BUN 22 10/12/2022   CREATININE 0.48 10/12/2022   GLUCOSE 105 (H) 10/12/2022   GFRNONAA >60 10/12/2022   GFRAA 62 04/04/2020   CALCIUM 8.3 (L) 10/12/2022   PHOS 3.1 10/12/2022   PROT 5.8 (L) 10/01/2022   ALBUMIN 1.9 (L) 10/01/2022   LABGLOB 2.6 04/04/2020   AGRATIO 1.7 04/04/2020   BILITOT 0.4 10/01/2022   ALKPHOS 70 10/01/2022   AST 31 10/01/2022   ALT 32 10/01/2022   ANIONGAP 11 10/12/2022    CBG (last 3)  No results for input(s): "GLUCAP" in the last 72 hours.    Coagulation Profile: No results for input(s): "INR", "PROTIME" in the last 168 hours.   Radiology Studies: I have personally reviewed the  imaging studies  No results found.     Estill Cotta M.D. [facility] 10/13/2022, 3:25 PM  Available via Epic secure chat 7am-7pm After 7 pm, please refer  to night coverage provider listed on amion.

## 2022-10-14 DIAGNOSIS — E870 Hyperosmolality and hypernatremia: Secondary | ICD-10-CM | POA: Diagnosis not present

## 2022-10-14 DIAGNOSIS — N179 Acute kidney failure, unspecified: Secondary | ICD-10-CM | POA: Diagnosis not present

## 2022-10-14 DIAGNOSIS — E876 Hypokalemia: Secondary | ICD-10-CM | POA: Diagnosis not present

## 2022-10-14 DIAGNOSIS — R7309 Other abnormal glucose: Secondary | ICD-10-CM | POA: Diagnosis not present

## 2022-10-14 MED ORDER — DAKINS (1/4 STRENGTH) 0.125 % EX SOLN
Freq: Every day | CUTANEOUS | Status: AC
  Filled 2022-10-14: qty 473

## 2022-10-14 NOTE — TOC Progression Note (Signed)
Transition of Care [facility]) - Progression Note    Patient Details  Name: Donna Simmons MRN: [id number] Date of Birth: Nov 29, 1940  Transition of Care [facility]) CM/SW [place], [place] Phone Number: 10/14/2022, 2:09 PM  Clinical Narrative:    CSW followed up with pt's spouse who states he received a reply from [place], and a case worker has been assigned. Katia White [phone], a VM was left for her. [name] is currently getting additional paperwork sent to DSS. CSW consulted supervisor as to the criteria for an LOG, and if pt now meets that. TOC will continue to follow for DC needs.         Expected Discharge Plan and Services                                               Social Determinants of Health (SDOH) Interventions SDOH Screenings   Food Insecurity: Unknown (07/03/2022)  Alcohol Screen: Low Risk  (04/12/2019)  Depression (PHQ2-9): Low Risk  (11/05/2020)  Tobacco Use: Low Risk  (08/20/2022)    Readmission Risk Interventions     No data to display

## 2022-10-14 NOTE — Progress Notes (Signed)
[facility]                                                                              Donna Simmons, is a 82 y.o. female, DOB - Jul 10, 1941, [id number] Admit date - 08/25/2022    Outpatient Primary MD for the patient is [name], DO  LOS - 57  days  Chief Complaint  Patient presents with   Failure To Thrive       Brief summary   Donna Simmons is a 82 y.o. female with PMH significant for Alzheimer's dementia, HTN, HLD, permanent A-fib on Eliquis, GERD. 12/25, patient was sent to ED from [facility] for poor oral intake, increased lethargy, failure to thrive. Workup showed hypernatremia of 167, AKI.  Urine culture grew ESBL E. coli. Patient completed the course of IV ertapenem.  She could not return to SNF because of unpaid bills.  Family unable to take care of her at home.  Medicaid pending. Difficult to place, TOC assisting with placement.  LLOS, Medically stable, awaiting placement   Assessment & Plan   Sinus tachycardia -Heart rate now controlled -BP stable, continue flecainide, hold beta-blocker  -TSH 1.7 -2D echo showed EF 50 to 55%, G1 DD, mild to moderate AR, mild MR -No acute medical issues     ESBL E. coli UTI Completed 5 days course of ertapenem   Hypernatremia Presented with sodium of 167 due to poor p.o. intake -Patient was placed on hypotonic fluid, sodium has subsequently improved -Resolved, sodium 141  Hypokalemia -Replaced on 2/11   Acute kidney injury -Resolved, creatinine 1.19 on admission -Creatinine 0.4     Chronic iron deficiency anemia -H&H currently stable   Permanent atrial fibrillation -Heart rate controlled, continue flecainide, apixaban -Metoprolol placed on hold due to soft BP    Alzheimer's dementia Continue delirium precautions  Pressure injury documentation -Sacrum unstageable -Right hip deep tissue pressure injury -Wound care per nursing, air mattress.  Severe protein calorie  malnutrition Etiology: chronic illness Signs/Symptoms: severe muscle depletion, severe fat depletion Interventions: Ensure Enlive (each supplement provides 350kcal and 20 grams of protein), MVI Estimated body mass index is 25.48 kg/m as calculated from the following:   Height as of this encounter: 5' 2"$  (1.575 m).   Weight as of this encounter: 63.2 kg.  Code Status: DNR DVT Prophylaxis:  SCDs Start: 08/25/22 0659 apixaban (ELIQUIS) tablet 5 mg   Level of Care: Level of care: Med-Surg Family Communication: Updated patient's husband at the bedside on 10/12/2022 Disposition Plan:      Remains inpatient appropriate:  No acute issues, awaiting placement   Procedures:   Consultants:    Antimicrobials:   Anti-infectives (From admission, onward)    Start     Dose/Rate Route Frequency Ordered Stop   09/03/22 1000  ertapenem (INVANZ) 1,000 mg in sodium chloride 0.9 % 100 mL IVPB        1 g 200 mL/hr over 30 Minutes Intravenous Every 24 hours 09/02/22 1421 09/04/22 2048   08/30/22 1500  ertapenem (INVANZ) 1,000 mg in sodium chloride 0.9 % 100 mL IVPB  Status:  Discontinued  1 g 200 mL/hr over 30 Minutes Intravenous Every 24 hours 08/30/22 1419 09/02/22 1421   08/29/22 1115  cefTRIAXone (ROCEPHIN) 1 g in sodium chloride 0.9 % 100 mL IVPB  Status:  Discontinued        1 g 200 mL/hr over 30 Minutes Intravenous Every 24 hours 08/29/22 1017 08/30/22 1419          Medications  apixaban  5 mg Oral BID   feeding supplement  237 mL Oral TID BM   ferrous sulfate  325 mg Oral QODAY   flecainide  50 mg Oral BID   leptospermum manuka honey  1 Application Topical Daily   multivitamin with minerals  1 tablet Oral Daily   pantoprazole  40 mg Oral Daily      Subjective:   Unnamed Forsyth was seen and examined today.  No complaints.  Awaiting placement.    Objective:   Vitals:   10/13/22 2301 10/13/22 2346 10/14/22 0609 10/14/22 0850  BP: (!) 100/55 (!) 99/56 117/78 105/63   Pulse: (!) 120 (!) 101 94 97  Resp: 14   16  Temp: 98.6 F (37 C) 98.2 F (36.8 C) 98.2 F (36.8 C) 98 F (36.7 C)  TempSrc:  Oral Oral Oral  SpO2:  100% 98% 100%  Weight:      Height:        Intake/Output Summary (Last 24 hours) at 10/14/2022 1224 Last data filed at 10/14/2022 1000 Gross per 24 hour  Intake 120 ml  Output --  Net 120 ml      Wt Readings from Last 3 Encounters:  10/12/22 63.2 kg  07/03/22 50.2 kg  04/24/22 54.4 kg   Physical Exam General: Alert, awake, appears comfortable. Cardiovascular: S1 S2 clear, RRR.  Respiratory: CTAB Gastrointestinal: Soft, nontender, nondistended, NBS Ext: no pedal edema bilaterally Skin: Sacral, right hip pressure wound unstageable, dressing intact Psych: dementia   Data Reviewed:  I have personally reviewed following labs    CBC Lab Results  Component Value Date   WBC 10.1 10/09/2022   RBC 3.69 (L) 10/09/2022   HGB 8.3 (L) 10/09/2022   HCT 27.1 (L) 10/09/2022   MCV 73.4 (L) 10/09/2022   MCH 22.5 (L) 10/09/2022   PLT 486 (H) 10/09/2022   MCHC 30.6 10/09/2022   RDW 18.8 (H) 10/09/2022   LYMPHSABS 1.1 10/09/2022   MONOABS 0.4 10/09/2022   EOSABS 0.0 10/09/2022   BASOSABS 0.0 [id number]     Last metabolic panel Lab Results  Component Value Date   NA 141 10/12/2022   K 3.2 (L) 10/12/2022   CL 104 10/12/2022   CO2 26 10/12/2022   BUN 22 10/12/2022   CREATININE 0.48 10/12/2022   GLUCOSE 105 (H) 10/12/2022   GFRNONAA >60 10/12/2022   GFRAA 62 04/04/2020   CALCIUM 8.3 (L) 10/12/2022   PHOS 3.1 10/12/2022   PROT 5.8 (L) 10/01/2022   ALBUMIN 1.9 (L) 10/01/2022   LABGLOB 2.6 04/04/2020   AGRATIO 1.7 04/04/2020   BILITOT 0.4 10/01/2022   ALKPHOS 70 10/01/2022   AST 31 10/01/2022   ALT 32 10/01/2022   ANIONGAP 11 10/12/2022    CBG (last 3)  No results for input(s): "GLUCAP" in the last 72 hours.    Coagulation Profile: No results for input(s): "INR", "PROTIME" in the last 168 hours.   Radiology  Studies: I have personally reviewed the imaging studies  No results found.     Estill Cotta M.D. [facility] 10/14/2022, 12:24 PM  Available  via Epic secure chat 7am-7pm After 7 pm, please refer to night coverage provider listed on amion.

## 2022-10-14 NOTE — Consult Note (Signed)
[facility] Nurse wound follow up Wound type: Pressure  Measurement:  Sacrum Unstageable Pressure Injury 3 cms x 3 cms x 1.8 cms 75% loosely adherent slough, 25% red, malodorous at this visit  R medial foot (base of great toe) full thickness 2 cms x 1.5 cms x 0.2 cms 50% yellow devitalized tissue 50% red moist  L lateral foot  Deep Tissue Pressure Injury that has evolved to full thickness 1 cm x 1 cm x 0.1 cm 80% yellow slough 20% pink and moist  R 5th digit digit healing full thickness 1.5 cms x 2 cms pink moist  R hip Deep Tissue Injury that has evolved to Unstageable 7 cms x 3 cms total area with 4 cms x 2 cms x 0.1 cm area of  black devitalized tissue  Deep Tissue Injury noted superior to R hip wound 0.5 cms x  0.5 cms maroon discoloration, skin intact.  Drainage (amount, consistency, odor)  Sacrum with tan malodorous drainage, R medial foot and L lateral foot serosanguinous drainage, minimal serosanguinous to R hip   Periwound: Sacral wound with pink scarred skin, rim of yellow devitalized tissue from 12 o'clock to 7 o'clock  R hip pink scarred tissue, area of Deep Tissue Pressure Injury noted superior measuring 0.5 cms x 0.5 cms  Dressing procedure/placement/frequency:  Sacrum apply Dakin's moistened gauze daily making sure to use Q tip applicator to insert Dakin's moistened gauze into wound bed, fill entire defect with fluffed Dakin's moistened gauze, cover with dry gauze and ABD pad or foam dressing to secure  R hip apply Dakin's moistened gauze to wound bed daily cover with dry gauze and ABD pad or foam dressing.  R medial and lateral foot continue to apply Medihoney to gauze and place on wound bed daily, cover with foam dressing.   R hip Deep Tissue Injury cover with foam dressing.  All foam dressings to be changed q3 days and prn soiling.   WOC will follow this patient weekly for management of wounds.  Low air loss mattress in place for moisture management and pressure redistribution.   Prevalon boots have been ordered for off loading of heels and protection of other pressure points due to patient being contracted.     Thank you,    Malaki Koury MSN, RN-BC, [employer]

## 2022-10-14 NOTE — Progress Notes (Signed)
Nutrition Brief Note   Pt remains stable for discharge, pending Medicaid application for placement.  Discussed pt with RN, pt eating just bites of food. RN unable to provide pt with any Ensure due to unit being out.   Pt on diet and Ensure ordered TID. Please continue to offer Ensure and assist feeding pt.   No additional nutrition interventions warranted at this time. RD will sign-off. Please re-consult as needed.    Hermina Barters RD, LDN Clinical Dietitian See Shea Evans for contact information.

## 2022-10-15 DIAGNOSIS — E870 Hyperosmolality and hypernatremia: Secondary | ICD-10-CM | POA: Diagnosis not present

## 2022-10-15 LAB — CBC
HCT: 27.7 % — ABNORMAL LOW (ref 36.0–46.0)
Hemoglobin: 8.4 g/dL — ABNORMAL LOW (ref 12.0–15.0)
MCH: 22.3 pg — ABNORMAL LOW (ref 26.0–34.0)
MCHC: 30.3 g/dL (ref 30.0–36.0)
MCV: 73.7 fL — ABNORMAL LOW (ref 80.0–100.0)
Platelets: 500 10*3/uL — ABNORMAL HIGH (ref 150–400)
RBC: 3.76 MIL/uL — ABNORMAL LOW (ref 3.87–5.11)
RDW: 19.2 % — ABNORMAL HIGH (ref 11.5–15.5)
WBC: 8.2 10*3/uL (ref 4.0–10.5)
nRBC: 0 % (ref 0.0–0.2)

## 2022-10-15 LAB — BASIC METABOLIC PANEL
Anion gap: 9 (ref 5–15)
BUN: 23 mg/dL (ref 8–23)
CO2: 27 mmol/L (ref 22–32)
Calcium: 8.5 mg/dL — ABNORMAL LOW (ref 8.9–10.3)
Chloride: 104 mmol/L (ref 98–111)
Creatinine, Ser: 0.51 mg/dL (ref 0.44–1.00)
GFR, Estimated: >60 mL/min (ref >60)
Glucose, Bld: 100 mg/dL — ABNORMAL HIGH (ref 70–99)
Potassium: 3.7 mmol/L (ref 3.5–5.1)
Sodium: 140 mmol/L (ref 135–145)

## 2022-10-15 MED ORDER — LIP MEDEX EX OINT
TOPICAL_OINTMENT | CUTANEOUS | Status: DC | PRN
  Administered 2022-10-16: 75 via TOPICAL
  Filled 2022-10-15: qty 7

## 2022-10-15 NOTE — TOC Progression Note (Signed)
Transition of Care [facility]) - Progression Note    Patient Details  Name: Donna Simmons MRN: [id number] Date of Birth: [birth year]  Transition of Care [facility]) CM/SW Contact  Coralee [place], [place] Phone Number: 10/15/2022, 1:17 PM  Clinical Narrative:     CSW received an email from pt's DSS worker confirming a pending Medicaid application. She notes she is still waiting on additional information from spouse. CSW spoke with spouse, he was advised that [facility] has been the only one willing to accept an LOG. Spouse states [facility] is too far away. Spouse states he is working with a Education officer, museum at [employer] to find another facility. CSW requested the name of the social worker, Spouse states he will provide it. CSW followed up again with other facilities. [facility] is reviewing again. LOG paperwork is ready to be submitted. TOC will continue to follow for DC needs.        Expected Discharge Plan and Services                                               Social Determinants of Health (SDOH) Interventions SDOH Screenings   Food Insecurity: Unknown (07/03/2022)  Alcohol Screen: Low Risk  (04/12/2019)  Depression (PHQ2-9): Low Risk  (11/05/2020)  Tobacco Use: Low Risk  (08/20/2022)    Readmission Risk Interventions     No data to display

## 2022-10-15 NOTE — Progress Notes (Signed)
PROGRESS NOTE    Donna Simmons  [id number] DOB: 1941/01/24 DOA: 08/25/2022 PCP: [name], DO    Brief Narrative:   Donna Simmons is a 82 y.o. female with past medical history significant for Alzheimer's dementia, HTN, HLD, permanent atrial fibrillation on EliquisWho presented to [facility] ED on 12/25 from [facility] for failure to thrive.  Patient with decreased oral intake over the past 3 days with increased lethargy.  EMS was activated and patient was brought to the ED for further evaluation.  Workup revealed ESBL UTI and patient completed antibiotic course with IV ertapenem.  Currently awaiting placement per TOC.  Assessment & Plan:   ESBL E. coli UTI Completed 5-day course of ertapenem.  Hypernatremia Presenting with a sodium level of 167, likely in the setting of poor oral intake/dehydration from hypovolemia.  Patient was started on IV fluid hydration with normalization of sodium level.  Continue to encourage increased oral intake.  Unfortunately, hyponatremia confers a very poor prognosis in the setting of dementia and palliative care was consulted; however family not ready for hospice at this time.  Sodium 140 on 2/14. -- Continue intermittent BMPs  Thrombocytosis Etiology in the setting of active infection as above.  Now resolved.  Hypokalemia Repleted, now resolved.  Acute renal failure: Resolved Presented with a creatinine 1.19, baseline 0.7.  Etiology likely secondary to severe dehydration, now resolved following IV fluid hydration.  Creatinine 0.51 on 2/14.  Transaminitis: Resolved Resolved with IV fluid hydration.  Hypoalbuminemia In the setting of poor oral intake, underlying dementia.  Continue to encourage increased oral intake.  Iron deficiency anemia Hemoglobin stable, continue intermittent monitoring of H&H.  Hemoglobin 8.4 on 2/14, stable.  Permanent atrial fibrillation -- Flecainide 50 mg p.o. twice daily -- Eliquis 5 mg p.o. twice daily --  Holding home metoprolol in the setting of soft blood pressures  Insomnia -- Melatonin 5 mg p.o. nightly as needed insomnia  Alzheimer's dementia --Delirium precautions --Get up during the day --Encourage a familiar face to remain present throughout the day --Keep blinds open and lights on during daylight hours --Minimize the use of opioids/benzodiazepines -- Evaluated by palliative care, family not ready for patient to enroll in hospice services yet.  Pressure injury sacrum Pressure Injury 09/04/22 Sacrum Unstageable - Full thickness tissue loss in which the base of the injury is covered by slough (yellow, tan, gray, green or brown) and/or eschar (tan, brown or black) in the wound bed. (Active)  09/04/22 1030  Location: Sacrum  Location Orientation:   Staging: Unstageable - Full thickness tissue loss in which the base of the injury is covered by slough (yellow, tan, gray, green or brown) and/or eschar (tan, brown or black) in the wound bed.  Wound Description (Comments):   Present on Admission: No     Pressure Injury 09/30/22 Hip Right Deep Tissue Pressure Injury - Purple or maroon localized area of discolored intact skin or blood-filled blister due to damage of underlying soft tissue from pressure and/or shear. 2 cms x 1 cm x 0.1 cms 80% red moist 20 (Active)  09/30/22 1200  Location: Hip  Location Orientation: Right  Staging: Deep Tissue Pressure Injury - Purple or maroon localized area of discolored intact skin or blood-filled blister due to damage of underlying soft tissue from pressure and/or shear.  Wound Description (Comments): 2 cms x 1 cm x 0.1 cms 80% red moist 20% yellow  Present on Admission: No  -- Wound care per nursing, air mattress, frequent  offloading    Severe protein calorie malnutrition Body mass index is 17.41 kg/m. Nutrition Status: Nutrition Problem: Severe Malnutrition Etiology: chronic illness Signs/Symptoms: severe muscle depletion, severe fat  depletion Interventions: Ensure Enlive (each supplement provides 350kcal and 20 grams of protein), MVI -- Continue to encourage increased oral intake  Disposition Per social work, patient cannot return to her previous SNF due to financial issues, they will also need to pay 30 days out from which spouse is unable to do so.  No family to assist at home.  Difficult placement, Medicaid application pending.  Currently medically stable for discharge once safe discharge plan acquired.  DVT prophylaxis: SCDs Start: 08/25/22 0659 apixaban (ELIQUIS) tablet 5 mg    Code Status: DNR Family Communication: No family present at bedside this morning  Disposition Plan:  Level of care: Med-Surg Status is: Inpatient Remains inpatient appropriate because: Medically stable for discharge once safe disposition found by social work    Consultants:  Palliative care  Procedures:  None  Antimicrobials:  Ertapenem 12/30 - 1/4  Ceftriaxone 12/29 - 12/30   Subjective: Patient seen examined bedside, resting comfortably.  Lying in bed.  Sleeping but easily arousable.  No family present.  No complaints this morning. Remains medically stable for discharge once disposition found per TOC.  Denies headache, no chest pain, no shortness of breath, no abdominal pain.  No acute events overnight per nursing staff.  Objective: Vitals:   10/14/22 2153 10/15/22 0457 10/15/22 0500 10/15/22 0824  BP: 111/77 104/60  91/67  Pulse: 80 85  89  Resp: 16 16  16  $ Temp: 97.6 F (36.4 C) 98 F (36.7 C)  97.7 F (36.5 C)  TempSrc: Oral Axillary  Oral  SpO2: 100% 100%  100%  Weight:   43.2 kg   Height:        Intake/Output Summary (Last 24 hours) at 10/15/2022 1410 Last data filed at 10/14/2022 1859 Gross per 24 hour  Intake 477 ml  Output --  Net 477 ml    Filed Weights   10/11/22 0423 10/12/22 0451 10/15/22 0500  Weight: 67.4 kg 63.2 kg 43.2 kg    Examination:  Physical Exam: GEN: NAD, alert,, pleasantly  confused, thin/cachectic in appearance HEENT: NCAT, PERRL, EOMI, sclera clear, dry mucous membranes PULM: CTAB w/o wheezes/crackles, normal respiratory effort, on room air CV: RRR w/o M/G/R GI: abd soft, NTND, NABS MSK: no peripheral edema Integumentary: Sacral/right hip pressure wound unstageable, dressing in place, otherwise no other concerning rashes/lesions/wounds noted on exposed skin surfaces.      Data Reviewed: I have personally reviewed following labs and imaging studies  CBC: Recent Labs  Lab 10/09/22 0336 10/15/22 0057  WBC 10.1 8.2  NEUTROABS 8.4*  --   HGB 8.3* 8.4*  HCT 27.1* 27.7*  MCV 73.4* 73.7*  PLT 486* [id number]*   Basic Metabolic Panel: Recent Labs  Lab 10/09/22 0336 10/12/22 0306 10/15/22 0057  NA 138 141 140  K 3.1* 3.2* 3.7  CL 100 104 104  CO2 29 26 27  $ GLUCOSE 106* 105* 100*  BUN 18 22 23  $ CREATININE 0.47 0.48 0.51  CALCIUM 8.4* 8.3* 8.5*  MG 2.2 2.0  --   PHOS 3.3 3.1  --    GFR: Estimated Creatinine Clearance: 38.9 mL/min (by C-G formula based on SCr of 0.51 mg/dL). Liver Function Tests: No results for input(s): "AST", "ALT", "ALKPHOS", "BILITOT", "PROT", "ALBUMIN" in the last 168 hours.  No results for input(s): "LIPASE", "AMYLASE" in the last 168  hours. No results for input(s): "AMMONIA" in the last 168 hours. Coagulation Profile: No results for input(s): "INR", "PROTIME" in the last 168 hours. Cardiac Enzymes: No results for input(s): "CKTOTAL", "CKMB", "CKMBINDEX", "TROPONINI" in the last 168 hours. BNP (last 3 results) No results for input(s): "PROBNP" in the last 8760 hours. HbA1C: No results for input(s): "HGBA1C" in the last 72 hours. CBG: No results for input(s): "GLUCAP" in the last 168 hours. Lipid Profile: No results for input(s): "CHOL", "HDL", "LDLCALC", "TRIG", "CHOLHDL", "LDLDIRECT" in the last 72 hours. Thyroid Function Tests: No results for input(s): "TSH", "T4TOTAL", "FREET4", "T3FREE", "THYROIDAB" in the last 72  hours. Anemia Panel: No results for input(s): "VITAMINB12", "FOLATE", "FERRITIN", "TIBC", "IRON", "RETICCTPCT" in the last 72 hours. Sepsis Labs: No results for input(s): "PROCALCITON", "LATICACIDVEN" in the last 168 hours.  No results found for this or any previous visit (from the past 240 hour(s)).       Radiology Studies: No results found.      Scheduled Meds:  apixaban  5 mg Oral BID   feeding supplement  237 mL Oral TID BM   ferrous sulfate  325 mg Oral QODAY   flecainide  50 mg Oral BID   leptospermum manuka honey  1 Application Topical Daily   multivitamin with minerals  1 tablet Oral Daily   pantoprazole  40 mg Oral Daily   sodium hypochlorite   Irrigation Daily   Continuous Infusions:   LOS: 50 days    Time spent: 48 minutes spent on chart review, discussion with nursing staff, consultants, updating family and interview/physical exam; more than 50% of that time was spent in counseling and/or coordination of care.    Najai Waszak J [place] DO [facility] Available via Epic secure chat 7am-7pm After these hours, please refer to coverage provider listed on [email or web address] 10/15/2022, 2:10 PM

## 2022-10-16 DIAGNOSIS — E870 Hyperosmolality and hypernatremia: Secondary | ICD-10-CM | POA: Diagnosis not present

## 2022-10-16 NOTE — Progress Notes (Signed)
PROGRESS NOTE    Donna Simmons  [id number] DOB: [birth year] DOA: 08/25/2022 PCP: [name], DO    Brief Narrative:   Donna Simmons is a 82 y.o. female with past medical history significant for Alzheimer's dementia, HTN, HLD, permanent atrial fibrillation on EliquisWho presented [facility] from [facility] for failure to thrive.  Patient with decreased oral intake over the past 3 days with increased lethargy.  EMS was activated and patient was brought to the ED for further evaluation.  Workup revealed ESBL UTI and patient completed antibiotic course with IV ertapenem.  Currently awaiting placement per TOC.  Assessment & Plan:   ESBL E. coli UTI Completed 5-day course of ertapenem.  Hypernatremia Presenting with a sodium level of 167, likely in the setting of poor oral intake/dehydration from hypovolemia.  Patient was started on IV fluid hydration with normalization of sodium level.  Continue to encourage increased oral intake.  Unfortunately, hyponatremia confers a very poor prognosis in the setting of dementia and palliative care was consulted; however family not ready for hospice at this time.  Sodium 140 on 2/14. -- Continue intermittent BMPs  Thrombocytosis Etiology in the setting of active infection as above.  Now resolved.  Hypokalemia Repleted, now resolved.  Acute renal failure: Resolved Presented with a creatinine 1.19, baseline 0.7.  Etiology likely secondary to severe dehydration, now resolved following IV fluid hydration.  Creatinine 0.51 on 2/14.  Transaminitis: Resolved Resolved with IV fluid hydration.  Hypoalbuminemia In the setting of poor oral intake, underlying dementia.  Continue to encourage increased oral intake.  Iron deficiency anemia Hemoglobin stable, continue intermittent monitoring of H&H.  Hemoglobin 8.4 on 2/14, stable.  Permanent atrial fibrillation -- Flecainide 50 mg p.o. twice daily -- Eliquis 5 mg p.o. twice daily --  Holding home metoprolol in the setting of soft blood pressures  Insomnia -- Melatonin 5 mg p.o. nightly as needed insomnia  Alzheimer's dementia --Delirium precautions --Get up during the day --Encourage a familiar face to remain present throughout the day --Keep blinds open and lights on during daylight hours --Minimize the use of opioids/benzodiazepines -- Evaluated by palliative care, family not ready for patient to enroll in hospice services yet.  Pressure injury sacrum Pressure Injury 09/04/22 Sacrum Unstageable - Full thickness tissue loss in which the base of the injury is covered by slough (yellow, tan, gray, green or brown) and/or eschar (tan, brown or black) in the wound bed. (Active)  09/04/22 1030  Location: Sacrum  Location Orientation:   Staging: Unstageable - Full thickness tissue loss in which the base of the injury is covered by slough (yellow, tan, gray, green or brown) and/or eschar (tan, brown or black) in the wound bed.  Wound Description (Comments):   Present on Admission: No     Pressure Injury 09/30/22 Hip Right Deep Tissue Pressure Injury - Purple or maroon localized area of discolored intact skin or blood-filled blister due to damage of underlying soft tissue from pressure and/or shear. 2 cms x 1 cm x 0.1 cms 80% red moist 20 (Active)  09/30/22 1200  Location: Hip  Location Orientation: Right  Staging: Deep Tissue Pressure Injury - Purple or maroon localized area of discolored intact skin or blood-filled blister due to damage of underlying soft tissue from pressure and/or shear.  Wound Description (Comments): 2 cms x 1 cm x 0.1 cms 80% red moist 20% yellow  Present on Admission: No  -- Wound care per nursing, air mattress, frequent  offloading    Severe protein calorie malnutrition Body mass index is 17.41 kg/m. Nutrition Status: Nutrition Problem: Severe Malnutrition Etiology: chronic illness Signs/Symptoms: severe muscle depletion, severe fat  depletion Interventions: Ensure Enlive (each supplement provides 350kcal and 20 grams of protein), MVI -- Continue to encourage increased oral intake  Disposition Per social work, patient cannot return to her previous SNF due to financial issues, they will also need to pay 30 days out from which spouse is unable to do so.  No family to assist at home.  Difficult placement, Medicaid application pending.  Currently medically stable for discharge once safe discharge plan acquired.  DVT prophylaxis: SCDs Start: 08/25/22 0659 apixaban (ELIQUIS) tablet 5 mg    Code Status: DNR Family Communication: No family present at bedside this morning  Disposition Plan:  Level of care: Med-Surg Status is: Inpatient Remains inpatient appropriate because: Medically stable for discharge once safe disposition found by social work    Consultants:  Palliative care  Procedures:  None  Antimicrobials:  Ertapenem 12/30 - 1/4  Ceftriaxone 12/29 - 12/30   Subjective: Patient seen examined bedside, resting comfortably.  Lying in bed.  Nurse tech present assisting with feeding breakfast this morning.  No family present.  No complaints this morning, pleasantly confused. Remains medically stable for discharge once disposition found per TOC.  No acute events overnight per nursing staff.  Objective: Vitals:   10/16/22 0613 10/16/22 0756 10/16/22 1038 10/16/22 1041  BP: (!) 101/46 99/64 (!) 87/55 96/64  Pulse: 91 (!) 107 91 91  Resp: 18 17 17   $ Temp: 97.6 F (36.4 C) (!) 97.5 F (36.4 C) 98.5 F (36.9 C)   TempSrc: Oral Axillary Oral   SpO2:  100% 100%   Weight:      Height:        Intake/Output Summary (Last 24 hours) at 10/16/2022 1242 Last data filed at 10/16/2022 [id number] Gross per 24 hour  Intake 1215 ml  Output --  Net 1215 ml    Filed Weights   10/11/22 0423 10/12/22 0451 10/15/22 0500  Weight: 67.4 kg 63.2 kg 43.2 kg    Examination:  Physical Exam: GEN: NAD, alert,, pleasantly confused,  thin/cachectic in appearance HEENT: NCAT, PERRL, EOMI, sclera clear, dry mucous membranes PULM: CTAB w/o wheezes/crackles, normal respiratory effort, on room air CV: RRR w/o M/G/R GI: abd soft, NTND, NABS MSK: no peripheral edema Integumentary: Sacral/right hip pressure wound unstageable, dressing in place, otherwise no other concerning rashes/lesions/wounds noted on exposed skin surfaces.      Data Reviewed: I have personally reviewed following labs and imaging studies  CBC: Recent Labs  Lab 10/15/22 0057  WBC 8.2  HGB 8.4*  HCT 27.7*  MCV 73.7*  PLT [id number]*   Basic Metabolic Panel: Recent Labs  Lab 10/12/22 0306 10/15/22 0057  NA 141 140  K 3.2* 3.7  CL 104 104  CO2 26 27  GLUCOSE 105* 100*  BUN 22 23  CREATININE 0.48 0.51  CALCIUM 8.3* 8.5*  MG 2.0  --   PHOS 3.1  --    GFR: Estimated Creatinine Clearance: 38.9 mL/min (by C-G formula based on SCr of 0.51 mg/dL). Liver Function Tests: No results for input(s): "AST", "ALT", "ALKPHOS", "BILITOT", "PROT", "ALBUMIN" in the last 168 hours.  No results for input(s): "LIPASE", "AMYLASE" in the last 168 hours. No results for input(s): "AMMONIA" in the last 168 hours. Coagulation Profile: No results for input(s): "INR", "PROTIME" in the last 168 hours. Cardiac Enzymes: No results  for input(s): "CKTOTAL", "CKMB", "CKMBINDEX", "TROPONINI" in the last 168 hours. BNP (last 3 results) No results for input(s): "PROBNP" in the last 8760 hours. HbA1C: No results for input(s): "HGBA1C" in the last 72 hours. CBG: No results for input(s): "GLUCAP" in the last 168 hours. Lipid Profile: No results for input(s): "CHOL", "HDL", "LDLCALC", "TRIG", "CHOLHDL", "LDLDIRECT" in the last 72 hours. Thyroid Function Tests: No results for input(s): "TSH", "T4TOTAL", "FREET4", "T3FREE", "THYROIDAB" in the last 72 hours. Anemia Panel: No results for input(s): "VITAMINB12", "FOLATE", "FERRITIN", "TIBC", "IRON", "RETICCTPCT" in the last 72  hours. Sepsis Labs: No results for input(s): "PROCALCITON", "LATICACIDVEN" in the last 168 hours.  No results found for this or any previous visit (from the past 240 hour(s)).       Radiology Studies: No results found.      Scheduled Meds:  apixaban  5 mg Oral BID   feeding supplement  237 mL Oral TID BM   ferrous sulfate  325 mg Oral QODAY   flecainide  50 mg Oral BID   leptospermum manuka honey  1 Application Topical Daily   multivitamin with minerals  1 tablet Oral Daily   pantoprazole  40 mg Oral Daily   sodium hypochlorite   Irrigation Daily   Continuous Infusions:   LOS: 51 days    Time spent: 48 minutes spent on chart review, discussion with nursing staff, consultants, updating family and interview/physical exam; more than 50% of that time was spent in counseling and/or coordination of care.    Christyna Letendre J [place] DO [facility] Available via Epic secure chat 7am-7pm After these hours, please refer to coverage provider listed on [email or web address] 10/16/2022, 12:42 PM

## 2022-10-16 NOTE — Plan of Care (Signed)
  Problem: Clinical Measurements: Goal: Ability to maintain clinical measurements within normal limits will improve Outcome: Progressing Goal: Will remain free from infection Outcome: Progressing Goal: Diagnostic test results will improve Outcome: Progressing Goal: Respiratory complications will improve Outcome: Progressing Goal: Cardiovascular complication will be avoided Outcome: Progressing   Problem: Activity: Goal: Risk for activity intolerance will decrease Outcome: Progressing   Problem: Nutrition: Goal: Adequate nutrition will be maintained Outcome: Progressing   Problem: Coping: Goal: Level of anxiety will decrease Outcome: Progressing   Problem: Elimination: Goal: Will not experience complications related to bowel motility Outcome: Progressing Goal: Will not experience complications related to urinary retention Outcome: Progressing   Problem: Pain Managment: Goal: General experience of comfort will improve Outcome: Progressing   Problem: Safety: Goal: Ability to remain free from injury will improve Outcome: Progressing

## 2022-10-16 NOTE — TOC Progression Note (Signed)
Transition of Care [facility]) - Progression Note    Patient Details  Name: JESSLY STALZER MRN: [id number] Date of Birth: 1940-12-20  [facility] CM/SW [place], [place] Phone Number: 10/16/2022, 12:30 PM  Clinical Narrative:     CSW received a return call from pt's spouse who provided the number to a social worker that works with one of the pt's doctors in the OP setting. Pt states she is also trying to assist in finding placement. CSW left a VM for Loura Back ([phone]), unsure of what assistance she can offer. [place] continuing to review. TOC will continue to follow for DC needs.        Expected Discharge Plan and Services                                               Social Determinants of Health (SDOH) Interventions SDOH Screenings   Food Insecurity: Unknown (07/03/2022)  Alcohol Screen: Low Risk  (04/12/2019)  Depression (PHQ2-9): Low Risk  (11/05/2020)  Tobacco Use: Low Risk  (08/20/2022)    Readmission Risk Interventions     No data to display

## 2022-10-17 DIAGNOSIS — E870 Hyperosmolality and hypernatremia: Secondary | ICD-10-CM | POA: Diagnosis not present

## 2022-10-17 NOTE — Progress Notes (Signed)
PROGRESS NOTE    [name]  [id number] DOB: Jan 13, 1941 DOA: 08/25/2022 PCP: Donna Oman, DO    Brief Narrative:   Donna Simmons is a 82 y.o. female with past medical history significant for Alzheimer's dementia, HTN, HLD, permanent atrial fibrillation on EliquisWho presented to [facility] ED on 12/25 from [facility] for failure to thrive.  Patient with decreased oral intake over the past 3 days with increased lethargy.  EMS was activated and patient was brought to the ED for further evaluation.  Workup revealed ESBL UTI and patient completed antibiotic course with IV ertapenem.  Currently awaiting placement per TOC.  Assessment & Plan:   ESBL E. coli UTI Completed 5-day course of ertapenem.  Hypernatremia Presenting with a sodium level of 167, likely in the setting of poor oral intake/dehydration from hypovolemia.  Patient was started on IV fluid hydration with normalization of sodium level.  Continue to encourage increased oral intake.  Unfortunately, hyponatremia confers a very poor prognosis in the setting of dementia and palliative care was consulted; however family not ready for hospice at this time.  Sodium 140 on 2/14. -- Continue intermittent BMPs  Thrombocytosis Etiology in the setting of active infection as above.  Now resolved.  Hypokalemia Repleted, now resolved.  Acute renal failure: Resolved Presented with a creatinine 1.19, baseline 0.7.  Etiology likely secondary to severe dehydration, now resolved following IV fluid hydration.  Creatinine 0.51 on 2/14.  Transaminitis: Resolved Resolved with IV fluid hydration.  Hypoalbuminemia In the setting of poor oral intake, underlying dementia.  Continue to encourage increased oral intake.  Iron deficiency anemia Hemoglobin stable, continue intermittent monitoring of H&H.  Hemoglobin 8.4 on 2/14, stable.  Permanent atrial fibrillation -- Flecainide 50 mg p.o. twice daily -- Eliquis 5 mg p.o. twice daily --  Holding home metoprolol in the setting of soft blood pressures  Insomnia -- Melatonin 5 mg p.o. nightly as needed insomnia  Alzheimer's dementia --Delirium precautions --Get up during the day --Encourage a familiar face to remain present throughout the day --Keep blinds open and lights on during daylight hours --Minimize the use of opioids/benzodiazepines -- Evaluated by palliative care, family not ready for patient to enroll in hospice services yet.  Pressure injury sacrum Pressure Injury 09/04/22 Sacrum Unstageable - Full thickness tissue loss in which the base of the injury is covered by slough (yellow, tan, gray, green or brown) and/or eschar (tan, brown or black) in the wound bed. (Active)  09/04/22 1030  Location: Sacrum  Location Orientation:   Staging: Unstageable - Full thickness tissue loss in which the base of the injury is covered by slough (yellow, tan, gray, green or brown) and/or eschar (tan, brown or black) in the wound bed.  Wound Description (Comments):   Present on Admission: No     Pressure Injury 09/30/22 Hip Right Deep Tissue Pressure Injury - Purple or maroon localized area of discolored intact skin or blood-filled blister due to damage of underlying soft tissue from pressure and/or shear. 2 cms x 1 cm x 0.1 cms 80% red moist 20 (Active)  09/30/22 1200  Location: Hip  Location Orientation: Right  Staging: Deep Tissue Pressure Injury - Purple or maroon localized area of discolored intact skin or blood-filled blister due to damage of underlying soft tissue from pressure and/or shear.  Wound Description (Comments): 2 cms x 1 cm x 0.1 cms 80% red moist 20% yellow  Present on Admission: No  -- Wound care per nursing, air mattress, frequent  offloading    Severe protein calorie malnutrition Body mass index is 17.41 kg/m. Nutrition Status: Nutrition Problem: Severe Malnutrition Etiology: chronic illness Signs/Symptoms: severe muscle depletion, severe fat  depletion Interventions: Ensure Enlive (each supplement provides 350kcal and 20 grams of protein), MVI -- Continue to encourage increased oral intake  Disposition Per social work, patient cannot return to her previous SNF due to financial issues, they will also need to pay 30 days out from which spouse is unable to do so.  No family to assist at home.  Difficult placement, Medicaid application pending.  Currently medically stable for discharge once safe discharge plan acquired.  DVT prophylaxis: SCDs Start: 08/25/22 0659 apixaban (ELIQUIS) tablet 5 mg    Code Status: DNR Family Communication: No family present at bedside this morning  Disposition Plan:  Level of care: Med-Surg Status is: Inpatient Remains inpatient appropriate because: Medically stable for discharge once safe disposition found by social work    Consultants:  Palliative care  Procedures:  None  Antimicrobials:  Ertapenem 12/30 - 1/4  Ceftriaxone 12/29 - 12/30   Subjective: Patient seen examined bedside, resting comfortably.  Lying in bed.  Sleeping but easily arousable.  No family present.  Pleasantly confused. Remains medically stable for discharge once disposition found per TOC.  No acute events overnight per nursing staff.  Objective: Vitals:   10/16/22 1822 10/16/22 1958 10/17/22 0419 10/17/22 0740  BP: (!) 108/96 90/61 97/63 $ 113/68  Pulse: 94 98 89 83  Resp: 17 17 17 17  $ Temp: 98.1 F (36.7 C) 98.2 F (36.8 C) 98.3 F (36.8 C) 98.2 F (36.8 C)  TempSrc: Oral Oral Oral Oral  SpO2: 99% 100% 100% 94%  Weight:      Height:        Intake/Output Summary (Last 24 hours) at 10/17/2022 1253 Last data filed at 10/17/2022 0920 Gross per 24 hour  Intake 1080 ml  Output --  Net 1080 ml    Filed Weights   10/11/22 0423 10/12/22 0451 10/15/22 0500  Weight: 67.4 kg 63.2 kg 43.2 kg    Examination:  Physical Exam: GEN: NAD, alert,, pleasantly confused, thin/cachectic in appearance HEENT: NCAT, PERRL,  EOMI, sclera clear, dry mucous membranes PULM: CTAB w/o wheezes/crackles, normal respiratory effort, on room air CV: RRR w/o M/G/R GI: abd soft, NTND, NABS MSK: no peripheral edema Integumentary: Sacral/right hip pressure wound unstageable, dressing in place, otherwise no other concerning rashes/lesions/wounds noted on exposed skin surfaces.      Data Reviewed: I have personally reviewed following labs and imaging studies  CBC: Recent Labs  Lab 10/15/22 0057  WBC 8.2  HGB 8.4*  HCT 27.7*  MCV 73.7*  PLT [id number]*   Basic Metabolic Panel: Recent Labs  Lab 10/12/22 0306 10/15/22 0057  NA 141 140  K 3.2* 3.7  CL 104 104  CO2 26 27  GLUCOSE 105* 100*  BUN 22 23  CREATININE 0.48 0.51  CALCIUM 8.3* 8.5*  MG 2.0  --   PHOS 3.1  --    GFR: Estimated Creatinine Clearance: 38.9 mL/min (by C-G formula based on SCr of 0.51 mg/dL). Liver Function Tests: No results for input(s): "AST", "ALT", "ALKPHOS", "BILITOT", "PROT", "ALBUMIN" in the last 168 hours.  No results for input(s): "LIPASE", "AMYLASE" in the last 168 hours. No results for input(s): "AMMONIA" in the last 168 hours. Coagulation Profile: No results for input(s): "INR", "PROTIME" in the last 168 hours. Cardiac Enzymes: No results for input(s): "CKTOTAL", "CKMB", "CKMBINDEX", "TROPONINI" in the last  168 hours. BNP (last 3 results) No results for input(s): "PROBNP" in the last 8760 hours. HbA1C: No results for input(s): "HGBA1C" in the last 72 hours. CBG: No results for input(s): "GLUCAP" in the last 168 hours. Lipid Profile: No results for input(s): "CHOL", "HDL", "LDLCALC", "TRIG", "CHOLHDL", "LDLDIRECT" in the last 72 hours. Thyroid Function Tests: No results for input(s): "TSH", "T4TOTAL", "FREET4", "T3FREE", "THYROIDAB" in the last 72 hours. Anemia Panel: No results for input(s): "VITAMINB12", "FOLATE", "FERRITIN", "TIBC", "IRON", "RETICCTPCT" in the last 72 hours. Sepsis Labs: No results for input(s):  "PROCALCITON", "LATICACIDVEN" in the last 168 hours.  No results found for this or any previous visit (from the past 240 hour(s)).       Radiology Studies: No results found.      Scheduled Meds:  apixaban  5 mg Oral BID   feeding supplement  237 mL Oral TID BM   ferrous sulfate  325 mg Oral QODAY   flecainide  50 mg Oral BID   leptospermum manuka honey  1 Application Topical Daily   multivitamin with minerals  1 tablet Oral Daily   pantoprazole  40 mg Oral Daily   sodium hypochlorite   Irrigation Daily   Continuous Infusions:   LOS: 52 days    Time spent: 48 minutes spent on chart review, discussion with nursing staff, consultants, updating family and interview/physical exam; more than 50% of that time was spent in counseling and/or coordination of care.    Donna Simmons J [place] DO [facility] Available via Epic secure chat 7am-7pm After these hours, please refer to coverage provider listed on [email or web address] 10/17/2022, 12:53 PM

## 2022-10-17 NOTE — TOC Progression Note (Signed)
Transition of Care [facility]) - Progression Note    Patient Details  Name: Donna Simmons MRN: [id number] Date of Birth: 26-May-1941  Transition of Care [facility]) CM/SW [place], [place] Phone Number: 10/17/2022, 1:59 PM  Clinical Narrative:    CSW spoke with Delana Meyer, Education officer, museum with an outpatient MD that follows pt. She advised that she would speak with pt and note she recommends a discharge to an appropriate facility, even if it is not to spouses liking. CSW spoke with [facility] who are reviewing their bed availability for possible admit with LOG. LOG paperwork will need to be submitted to [facility] leadership. MG reviewing for possible admit next week. CSW spoke with spouse who is agreeable to this plan. TOC will continue to follow for DC needs.         Expected Discharge Plan and Services                                               Social Determinants of Health (SDOH) Interventions SDOH Screenings   Food Insecurity: Unknown (07/03/2022)  Alcohol Screen: Low Risk  (04/12/2019)  Depression (PHQ2-9): Low Risk  (11/05/2020)  Tobacco Use: Low Risk  (08/20/2022)    Readmission Risk Interventions     No data to display

## 2022-10-18 DIAGNOSIS — E870 Hyperosmolality and hypernatremia: Secondary | ICD-10-CM | POA: Diagnosis not present

## 2022-10-18 NOTE — Progress Notes (Signed)
PROGRESS NOTE    Donna Simmons  [id number] DOB: [birth year] DOA: 08/25/2022 PCP: [name], DO    Brief Narrative:   Donna Simmons is a 82 y.o. female with past medical history significant for Alzheimer's dementia, HTN, HLD, permanent atrial fibrillation on EliquisWho presented to [facility] ED on 12/25 from [facility] for failure to thrive.  Patient with decreased oral intake over the past 3 days with increased lethargy.  EMS was activated and patient was brought to the ED for further evaluation.  Workup revealed ESBL UTI and patient completed antibiotic course with IV ertapenem.  Currently awaiting placement per TOC.  Assessment & Plan:   ESBL E. coli UTI Completed 5-day course of ertapenem.  Hypernatremia Presenting with a sodium level of 167, likely in the setting of poor oral intake/dehydration from hypovolemia.  Patient was started on IV fluid hydration with normalization of sodium level.  Continue to encourage increased oral intake.  Unfortunately, hyponatremia confers a very poor prognosis in the setting of dementia and palliative care was consulted; however family not ready for hospice at this time.  Sodium 140 on 2/14. -- Continue intermittent BMPs  Thrombocytosis Etiology in the setting of active infection as above.  Now resolved.  Hypokalemia Repleted, now resolved.  Acute renal failure: Resolved Presented with a creatinine 1.19, baseline 0.7.  Etiology likely secondary to severe dehydration, now resolved following IV fluid hydration.  Creatinine 0.51 on 2/14.  Transaminitis: Resolved Resolved with IV fluid hydration.  Hypoalbuminemia In the setting of poor oral intake, underlying dementia.  Continue to encourage increased oral intake.  Iron deficiency anemia Hemoglobin stable, continue intermittent monitoring of H&H.  Hemoglobin 8.4 on 2/14, stable.  Permanent atrial fibrillation -- Flecainide 50 mg p.o. twice daily -- Eliquis 5 mg p.o. twice daily --  Holding home metoprolol in the setting of soft blood pressures  Insomnia -- Melatonin 5 mg p.o. nightly as needed insomnia  Alzheimer's dementia --Delirium precautions --Get up during the day --Encourage a familiar face to remain present throughout the day --Keep blinds open and lights on during daylight hours --Minimize the use of opioids/benzodiazepines -- Evaluated by palliative care, family not ready for patient to enroll in hospice services yet.  Pressure injury sacrum Pressure Injury 09/04/22 Sacrum Unstageable - Full thickness tissue loss in which the base of the injury is covered by slough (yellow, tan, gray, green or brown) and/or eschar (tan, brown or black) in the wound bed. (Active)  09/04/22 1030  Location: Sacrum  Location Orientation:   Staging: Unstageable - Full thickness tissue loss in which the base of the injury is covered by slough (yellow, tan, gray, green or brown) and/or eschar (tan, brown or black) in the wound bed.  Wound Description (Comments):   Present on Admission: No     Pressure Injury 09/30/22 Hip Right Deep Tissue Pressure Injury - Purple or maroon localized area of discolored intact skin or blood-filled blister due to damage of underlying soft tissue from pressure and/or shear. 2 cms x 1 cm x 0.1 cms 80% red moist 20 (Active)  09/30/22 1200  Location: Hip  Location Orientation: Right  Staging: Deep Tissue Pressure Injury - Purple or maroon localized area of discolored intact skin or blood-filled blister due to damage of underlying soft tissue from pressure and/or shear.  Wound Description (Comments): 2 cms x 1 cm x 0.1 cms 80% red moist 20% yellow  Present on Admission: No  -- Wound care per nursing, air mattress, frequent  offloading    Severe protein calorie malnutrition Body mass index is 17.41 kg/m. Nutrition Status: Nutrition Problem: Severe Malnutrition Etiology: chronic illness Signs/Symptoms: severe muscle depletion, severe fat  depletion Interventions: Ensure Enlive (each supplement provides 350kcal and 20 grams of protein), MVI -- Continue to encourage increased oral intake  Disposition Per social work, patient cannot return to her previous SNF due to financial issues, they will also need to pay 30 days out from which spouse is unable to do so.  No family to assist at home.  Difficult placement, Medicaid application pending.  Currently medically stable for discharge once safe discharge plan acquired.  DVT prophylaxis: SCDs Start: 08/25/22 0659 apixaban (ELIQUIS) tablet 5 mg    Code Status: DNR Family Communication: No family present at bedside this morning  Disposition Plan:  Level of care: Med-Surg Status is: Inpatient Remains inpatient appropriate because: Medically stable for discharge once safe disposition found by social work    Consultants:  Palliative care  Procedures:  None  Antimicrobials:  Ertapenem 12/30 - 1/4  Ceftriaxone 12/29 - 12/30   Subjective: Patient seen examined bedside, resting comfortably.  Lying in bed.  Watching TV.  No family present.  Pleasantly confused. Remains medically stable for discharge once disposition found per TOC.  No acute events overnight per nursing staff.  Objective: Vitals:   10/17/22 0740 10/17/22 2158 10/18/22 0707 10/18/22 0732  BP: 113/68 (!) 96/54 101/62 101/71  Pulse: 83 85 80 64  Resp: 17 16 18 16  $ Temp: 98.2 F (36.8 C) 97.9 F (36.6 C) 98 F (36.7 C) 98.2 F (36.8 C)  TempSrc: Oral Axillary Oral Oral  SpO2: 94% 95% 96% 99%  Weight:      Height:        Intake/Output Summary (Last 24 hours) at 10/18/2022 1119 Last data filed at 10/18/2022 1000 Gross per 24 hour  Intake 650 ml  Output --  Net 650 ml    Filed Weights   10/11/22 0423 10/12/22 0451 10/15/22 0500  Weight: 67.4 kg 63.2 kg 43.2 kg    Examination:  Physical Exam: GEN: NAD, alert,, pleasantly confused, thin/cachectic in appearance HEENT: NCAT, PERRL, EOMI, sclera  clear, dry mucous membranes PULM: CTAB w/o wheezes/crackles, normal respiratory effort, on room air CV: RRR w/o M/G/R GI: abd soft, NTND, NABS MSK: no peripheral edema Integumentary: Sacral/right hip pressure wound unstageable, dressing in place, otherwise no other concerning rashes/lesions/wounds noted on exposed skin surfaces.      Data Reviewed: I have personally reviewed following labs and imaging studies  CBC: Recent Labs  Lab 10/15/22 0057  WBC 8.2  HGB 8.4*  HCT 27.7*  MCV 73.7*  PLT [id number]*   Basic Metabolic Panel: Recent Labs  Lab 10/12/22 0306 10/15/22 0057  NA 141 140  K 3.2* 3.7  CL 104 104  CO2 26 27  GLUCOSE 105* 100*  BUN 22 23  CREATININE 0.48 0.51  CALCIUM 8.3* 8.5*  MG 2.0  --   PHOS 3.1  --    GFR: Estimated Creatinine Clearance: 38.9 mL/min (by C-G formula based on SCr of 0.51 mg/dL). Liver Function Tests: No results for input(s): "AST", "ALT", "ALKPHOS", "BILITOT", "PROT", "ALBUMIN" in the last 168 hours.  No results for input(s): "LIPASE", "AMYLASE" in the last 168 hours. No results for input(s): "AMMONIA" in the last 168 hours. Coagulation Profile: No results for input(s): "INR", "PROTIME" in the last 168 hours. Cardiac Enzymes: No results for input(s): "CKTOTAL", "CKMB", "CKMBINDEX", "TROPONINI" in the last 168 hours.  BNP (last 3 results) No results for input(s): "PROBNP" in the last 8760 hours. HbA1C: No results for input(s): "HGBA1C" in the last 72 hours. CBG: No results for input(s): "GLUCAP" in the last 168 hours. Lipid Profile: No results for input(s): "CHOL", "HDL", "LDLCALC", "TRIG", "CHOLHDL", "LDLDIRECT" in the last 72 hours. Thyroid Function Tests: No results for input(s): "TSH", "T4TOTAL", "FREET4", "T3FREE", "THYROIDAB" in the last 72 hours. Anemia Panel: No results for input(s): "VITAMINB12", "FOLATE", "FERRITIN", "TIBC", "IRON", "RETICCTPCT" in the last 72 hours. Sepsis Labs: No results for input(s): "PROCALCITON",  "LATICACIDVEN" in the last 168 hours.  No results found for this or any previous visit (from the past 240 hour(s)).       Radiology Studies: No results found.      Scheduled Meds:  apixaban  5 mg Oral BID   feeding supplement  237 mL Oral TID BM   ferrous sulfate  325 mg Oral QODAY   flecainide  50 mg Oral BID   leptospermum manuka honey  1 Application Topical Daily   multivitamin with minerals  1 tablet Oral Daily   pantoprazole  40 mg Oral Daily   sodium hypochlorite   Irrigation Daily   Continuous Infusions:   LOS: 53 days    Time spent: 48 minutes spent on chart review, discussion with nursing staff, consultants, updating family and interview/physical exam; more than 50% of that time was spent in counseling and/or coordination of care.    Page Lancon J [place] DO [facility] Available via Epic secure chat 7am-7pm After these hours, please refer to coverage provider listed on [email or web address] 10/18/2022, 11:19 AM

## 2022-10-18 NOTE — Plan of Care (Signed)

## 2022-10-19 ENCOUNTER — Encounter (HOSPITAL_COMMUNITY): Payer: Self-pay | Admitting: Internal Medicine

## 2022-10-19 DIAGNOSIS — E870 Hyperosmolality and hypernatremia: Secondary | ICD-10-CM | POA: Diagnosis not present

## 2022-10-19 NOTE — Progress Notes (Signed)
PROGRESS NOTE    [name]  [id number] DOB: 06/23/1941 DOA: 08/25/2022 PCP: [name], DO    Brief Narrative:   Donna Simmons is a 82 y.o. female with past medical history significant for Alzheimer's dementia, HTN, HLD, permanent atrial fibrillation on EliquisWho presented to [facility] from [facility] for failure to thrive.  Patient with decreased oral intake over the past 3 days with increased lethargy.  EMS was activated and patient was brought to the ED for further evaluation.  Workup revealed ESBL UTI and patient completed antibiotic course with IV ertapenem.  Currently awaiting placement per TOC.  Assessment & Plan:   ESBL E. coli UTI Completed 5-day course of ertapenem.  Hypernatremia Presenting with a sodium level of 167, likely in the setting of poor oral intake/dehydration from hypovolemia.  Patient was started on IV fluid hydration with normalization of sodium level.  Continue to encourage increased oral intake.  Unfortunately, hyponatremia confers a very poor prognosis in the setting of dementia and palliative care was consulted; however family not ready for hospice at this time.  Sodium 140 on 2/14. -- Continue intermittent BMPs  Thrombocytosis Etiology in the setting of active infection as above.  Now resolved.  Hypokalemia Repleted, now resolved.  Acute renal failure: Resolved Presented with a creatinine 1.19, baseline 0.7.  Etiology likely secondary to severe dehydration, now resolved following IV fluid hydration.  Creatinine 0.51 on 2/14.  Transaminitis: Resolved Resolved with IV fluid hydration.  Hypoalbuminemia In the setting of poor oral intake, underlying dementia.  Continue to encourage increased oral intake.  Iron deficiency anemia Hemoglobin stable, continue intermittent monitoring of H&H.  Hemoglobin 8.4 on 2/14, stable.  Permanent atrial fibrillation -- Flecainide 50 mg p.o. twice daily -- Eliquis 5 mg p.o. twice daily --  Holding home metoprolol in the setting of soft blood pressures  Insomnia -- Melatonin 5 mg p.o. nightly as needed insomnia  Alzheimer's dementia --Delirium precautions --Get up during the day --Encourage a familiar face to remain present throughout the day --Keep blinds open and lights on during daylight hours --Minimize the use of opioids/benzodiazepines -- Evaluated by palliative care, family not ready for patient to enroll in hospice services yet.  Pressure injury sacrum Pressure Injury 09/04/22 Sacrum Unstageable - Full thickness tissue loss in which the base of the injury is covered by slough (yellow, tan, gray, green or brown) and/or eschar (tan, brown or black) in the wound bed. (Active)  09/04/22 1030  Location: Sacrum  Location Orientation:   Staging: Unstageable - Full thickness tissue loss in which the base of the injury is covered by slough (yellow, tan, gray, green or brown) and/or eschar (tan, brown or black) in the wound bed.  Wound Description (Comments):   Present on Admission: No     Pressure Injury 09/30/22 Hip Right Deep Tissue Pressure Injury - Purple or maroon localized area of discolored intact skin or blood-filled blister due to damage of underlying soft tissue from pressure and/or shear. 2 cms x 1 cm x 0.1 cms 80% red moist 20 (Active)  09/30/22 1200  Location: Hip  Location Orientation: Right  Staging: Deep Tissue Pressure Injury - Purple or maroon localized area of discolored intact skin or blood-filled blister due to damage of underlying soft tissue from pressure and/or shear.  Wound Description (Comments): 2 cms x 1 cm x 0.1 cms 80% red moist 20% yellow  Present on Admission: No  -- Wound care per nursing, air mattress, frequent  offloading    Severe protein calorie malnutrition Body mass index is 17.41 kg/m. Nutrition Status: Nutrition Problem: Severe Malnutrition Etiology: chronic illness Signs/Symptoms: severe muscle depletion, severe fat  depletion Interventions: Ensure Enlive (each supplement provides 350kcal and 20 grams of protein), MVI -- Continue to encourage increased oral intake  Disposition Per social work, patient cannot return to her previous SNF due to financial issues, they will also need to pay 30 days out from which spouse is unable to do so.  No family to assist at home.  Difficult placement, Medicaid application pending.  Currently medically stable for discharge once safe discharge plan acquired.  DVT prophylaxis: SCDs Start: 08/25/22 0659 apixaban (ELIQUIS) tablet 5 mg    Code Status: DNR Family Communication: No family present at bedside this morning  Disposition Plan:  Level of care: Med-Surg Status is: Inpatient Remains inpatient appropriate because: Medically stable for discharge once safe disposition found by social work    Consultants:  Palliative care  Procedures:  None  Antimicrobials:  Ertapenem 12/30 - 1/4  Ceftriaxone 12/29 - 12/30   Subjective: Patient seen examined bedside, resting comfortably.  Lying in bed.  Sleeping but easily arousable.  No family present.  Remains pleasantly confused.  Medically stable for discharge once disposition found per TOC.  No acute events overnight per nursing staff.  Objective: Vitals:   10/18/22 1608 10/18/22 2154 10/19/22 0657 10/19/22 0825  BP: 107/72 (!) 93/55 103/70 (!) 93/57  Pulse: 77 (!) 102 100 88  Resp: 16 16 17 16  $ Temp: 98.2 F (36.8 C) 98 F (36.7 C) 98.3 F (36.8 C) 98.4 F (36.9 C)  TempSrc: Oral Axillary Axillary Oral  SpO2: 100% 100% 100% 100%  Weight:      Height:       No intake or output data in the 24 hours ending 10/19/22 1159   Filed Weights   10/11/22 0423 10/12/22 0451 10/15/22 0500  Weight: 67.4 kg 63.2 kg 43.2 kg    Examination:  Physical Exam: GEN: NAD, alert,, pleasantly confused, thin/cachectic in appearance HEENT: NCAT, PERRL, EOMI, sclera clear, dry mucous membranes PULM: CTAB w/o wheezes/crackles,  normal respiratory effort, on room air CV: RRR w/o M/G/R GI: abd soft, NTND, NABS MSK: no peripheral edema Integumentary: Sacral/right hip pressure wound unstageable, dressing in place, otherwise no other concerning rashes/lesions/wounds noted on exposed skin surfaces.      Data Reviewed: I have personally reviewed following labs and imaging studies  CBC: Recent Labs  Lab 10/15/22 0057  WBC 8.2  HGB 8.4*  HCT 27.7*  MCV 73.7*  PLT [id number]*   Basic Metabolic Panel: Recent Labs  Lab 10/15/22 0057  NA 140  K 3.7  CL 104  CO2 27  GLUCOSE 100*  BUN 23  CREATININE 0.51  CALCIUM 8.5*   GFR: Estimated Creatinine Clearance: 38.9 mL/min (by C-G formula based on SCr of 0.51 mg/dL). Liver Function Tests: No results for input(s): "AST", "ALT", "ALKPHOS", "BILITOT", "PROT", "ALBUMIN" in the last 168 hours.  No results for input(s): "LIPASE", "AMYLASE" in the last 168 hours. No results for input(s): "AMMONIA" in the last 168 hours. Coagulation Profile: No results for input(s): "INR", "PROTIME" in the last 168 hours. Cardiac Enzymes: No results for input(s): "CKTOTAL", "CKMB", "CKMBINDEX", "TROPONINI" in the last 168 hours. BNP (last 3 results) No results for input(s): "PROBNP" in the last 8760 hours. HbA1C: No results for input(s): "HGBA1C" in the last 72 hours. CBG: No results for input(s): "GLUCAP" in the last 168 hours.  Lipid Profile: No results for input(s): "CHOL", "HDL", "LDLCALC", "TRIG", "CHOLHDL", "LDLDIRECT" in the last 72 hours. Thyroid Function Tests: No results for input(s): "TSH", "T4TOTAL", "FREET4", "T3FREE", "THYROIDAB" in the last 72 hours. Anemia Panel: No results for input(s): "VITAMINB12", "FOLATE", "FERRITIN", "TIBC", "IRON", "RETICCTPCT" in the last 72 hours. Sepsis Labs: No results for input(s): "PROCALCITON", "LATICACIDVEN" in the last 168 hours.  No results found for this or any previous visit (from the past 240 hour(s)).       Radiology  Studies: No results found.      Scheduled Meds:  apixaban  5 mg Oral BID   feeding supplement  237 mL Oral TID BM   ferrous sulfate  325 mg Oral QODAY   flecainide  50 mg Oral BID   leptospermum manuka honey  1 Application Topical Daily   multivitamin with minerals  1 tablet Oral Daily   pantoprazole  40 mg Oral Daily   sodium hypochlorite   Irrigation Daily   Continuous Infusions:   LOS: 54 days    Time spent: 48 minutes spent on chart review, discussion with nursing staff, consultants, updating family and interview/physical exam; more than 50% of that time was spent in counseling and/or coordination of care.    Kendell Gammon J [place] DO [facility] Available via Epic secure chat 7am-7pm After these hours, please refer to coverage provider listed on [email or web address] 10/19/2022, 11:59 AM

## 2022-10-20 DIAGNOSIS — E870 Hyperosmolality and hypernatremia: Secondary | ICD-10-CM | POA: Diagnosis not present

## 2022-10-20 NOTE — Progress Notes (Signed)
PROGRESS NOTE    Donna Simmons  [id number] DOB: 1941-07-18 DOA: 08/25/2022 PCP: [name], DO    Brief Narrative:   Donna Simmons is a 82 y.o. female with past medical history significant for Alzheimer's dementia, HTN, HLD, permanent atrial fibrillation on EliquisWho [facility] from [facility] for failure to thrive.  Patient with decreased oral intake over the past 3 days with increased lethargy.  EMS was activated and patient was brought to the ED for further evaluation.  Workup revealed ESBL UTI and patient completed antibiotic course with IV ertapenem.  Currently awaiting placement per TOC.  Assessment & Plan:   ESBL E. coli UTI Completed 5-day course of ertapenem.  Hypernatremia Presenting with a sodium level of 167, likely in the setting of poor oral intake/dehydration from hypovolemia.  Patient was started on IV fluid hydration with normalization of sodium level.  Continue to encourage increased oral intake.  Unfortunately, hyponatremia confers a very poor prognosis in the setting of dementia and palliative care was consulted; however family not ready for hospice at this time.  Sodium 140 on 2/14. -- Continue intermittent BMPs  Thrombocytosis Etiology in the setting of active infection as above.  Now resolved.  Hypokalemia Repleted, now resolved.  Acute renal failure: Resolved Presented with a creatinine 1.19, baseline 0.7.  Etiology likely secondary to severe dehydration, now resolved following IV fluid hydration.  Creatinine 0.51 on 2/14.  Transaminitis: Resolved Resolved with IV fluid hydration.  Hypoalbuminemia In the setting of poor oral intake, underlying dementia.  Continue to encourage increased oral intake.  Iron deficiency anemia Hemoglobin stable, continue intermittent monitoring of H&H.  Hemoglobin 8.4 on 2/14, stable.  Permanent atrial fibrillation -- Flecainide 50 mg p.o. twice daily -- Eliquis 5 mg p.o. twice daily --  Holding home metoprolol in the setting of soft blood pressures  Insomnia -- Melatonin 5 mg p.o. nightly as needed insomnia  Alzheimer's dementia --Delirium precautions --Get up during the day --Encourage a familiar face to remain present throughout the day --Keep blinds open and lights on during daylight hours --Minimize the use of opioids/benzodiazepines -- Evaluated by palliative care, family not ready for patient to enroll in hospice services yet.  Pressure injury sacrum Pressure Injury 09/04/22 Sacrum Unstageable - Full thickness tissue loss in which the base of the injury is covered by slough (yellow, tan, gray, green or brown) and/or eschar (tan, brown or black) in the wound bed. (Active)  09/04/22 1030  Location: Sacrum  Location Orientation:   Staging: Unstageable - Full thickness tissue loss in which the base of the injury is covered by slough (yellow, tan, gray, green or brown) and/or eschar (tan, brown or black) in the wound bed.  Wound Description (Comments):   Present on Admission: No     Pressure Injury 09/30/22 Hip Right Deep Tissue Pressure Injury - Purple or maroon localized area of discolored intact skin or blood-filled blister due to damage of underlying soft tissue from pressure and/or shear. 2 cms x 1 cm x 0.1 cms 80% red moist 20 (Active)  09/30/22 1200  Location: Hip  Location Orientation: Right  Staging: Deep Tissue Pressure Injury - Purple or maroon localized area of discolored intact skin or blood-filled blister due to damage of underlying soft tissue from pressure and/or shear.  Wound Description (Comments): 2 cms x 1 cm x 0.1 cms 80% red moist 20% yellow  Present on Admission: No  -- Wound care per nursing, air mattress, frequent  offloading    Severe protein calorie malnutrition Body mass index is 17.4 kg/m. Nutrition Status: Nutrition Problem: Severe Malnutrition Etiology: chronic illness Signs/Symptoms: severe muscle depletion, severe fat  depletion Interventions: Ensure Enlive (each supplement provides 350kcal and 20 grams of protein), MVI -- Continue to encourage increased oral intake  Disposition Per social work, patient cannot return to her previous SNF due to financial issues, they will also need to pay 30 days out from which spouse is unable to do so.  No family to assist at home.  Difficult placement, Medicaid application pending.  Currently medically stable for discharge once safe discharge plan acquired.  DVT prophylaxis: SCDs Start: 08/25/22 0659 apixaban (ELIQUIS) tablet 5 mg    Code Status: DNR Family Communication: No family present at bedside this morning  Disposition Plan:  Level of care: Med-Surg Status is: Inpatient Remains inpatient appropriate because: Medically stable for discharge once safe disposition found by social work    Consultants:  Palliative care  Procedures:  None  Antimicrobials:  Ertapenem 12/30 - 1/4  Ceftriaxone 12/29 - 12/30   Subjective: Patient seen examined bedside, resting comfortably.  Lying in bed.  Sleeping but easily arousable.  No family present.  Remains pleasantly confused.  Medically stable for discharge once disposition found per TOC.  No acute events overnight per nursing staff.  Objective: Vitals:   10/19/22 1943 10/20/22 0423 10/20/22 0439 10/20/22 0759  BP: 104/68 111/62  (!) 90/55  Pulse: 92 86  81  Resp: 18 18  16  $ Temp: 98.5 F (36.9 C) 98.7 F (37.1 C)  98 F (36.7 C)  TempSrc: Oral Axillary  Oral  SpO2: 99% 100%  99%  Weight:   43.1 kg   Height:        Intake/Output Summary (Last 24 hours) at 10/20/2022 1032 Last data filed at 10/20/2022 0058 Gross per 24 hour  Intake --  Output 100 ml  Net -100 ml     Filed Weights   10/12/22 0451 10/15/22 0500 10/20/22 0439  Weight: 63.2 kg 43.2 kg 43.1 kg    Examination:  Physical Exam: GEN: NAD, alert,, pleasantly confused, thin/cachectic in appearance HEENT: NCAT, PERRL, EOMI, sclera clear,  dry mucous membranes PULM: CTAB w/o wheezes/crackles, normal respiratory effort, on room air CV: RRR w/o M/G/R GI: abd soft, NTND, NABS MSK: no peripheral edema Integumentary: Sacral/right hip pressure wound unstageable, dressing in place, otherwise no other concerning rashes/lesions/wounds noted on exposed skin surfaces.      Data Reviewed: I have personally reviewed following labs and imaging studies  CBC: Recent Labs  Lab 10/15/22 0057  WBC 8.2  HGB 8.4*  HCT 27.7*  MCV 73.7*  PLT [id number]*   Basic Metabolic Panel: Recent Labs  Lab 10/15/22 0057  NA 140  K 3.7  CL 104  CO2 27  GLUCOSE 100*  BUN 23  CREATININE 0.51  CALCIUM 8.5*   GFR: Estimated Creatinine Clearance: 38.8 mL/min (by C-G formula based on SCr of 0.51 mg/dL). Liver Function Tests: No results for input(s): "AST", "ALT", "ALKPHOS", "BILITOT", "PROT", "ALBUMIN" in the last 168 hours.  No results for input(s): "LIPASE", "AMYLASE" in the last 168 hours. No results for input(s): "AMMONIA" in the last 168 hours. Coagulation Profile: No results for input(s): "INR", "PROTIME" in the last 168 hours. Cardiac Enzymes: No results for input(s): "CKTOTAL", "CKMB", "CKMBINDEX", "TROPONINI" in the last 168 hours. BNP (last 3 results) No results for input(s): "PROBNP" in the last 8760 hours. HbA1C: No results for input(s): "HGBA1C"  in the last 72 hours. CBG: No results for input(s): "GLUCAP" in the last 168 hours. Lipid Profile: No results for input(s): "CHOL", "HDL", "LDLCALC", "TRIG", "CHOLHDL", "LDLDIRECT" in the last 72 hours. Thyroid Function Tests: No results for input(s): "TSH", "T4TOTAL", "FREET4", "T3FREE", "THYROIDAB" in the last 72 hours. Anemia Panel: No results for input(s): "VITAMINB12", "FOLATE", "FERRITIN", "TIBC", "IRON", "RETICCTPCT" in the last 72 hours. Sepsis Labs: No results for input(s): "PROCALCITON", "LATICACIDVEN" in the last 168 hours.  No results found for this or any previous visit  (from the past 240 hour(s)).       Radiology Studies: No results found.      Scheduled Meds:  apixaban  5 mg Oral BID   feeding supplement  237 mL Oral TID BM   ferrous sulfate  325 mg Oral QODAY   flecainide  50 mg Oral BID   leptospermum manuka honey  1 Application Topical Daily   multivitamin with minerals  1 tablet Oral Daily   pantoprazole  40 mg Oral Daily   sodium hypochlorite   Irrigation Daily   Continuous Infusions:   LOS: 55 days    Time spent: 42 minutes spent on chart review, discussion with nursing staff, consultants, updating family and interview/physical exam; more than 50% of that time was spent in counseling and/or coordination of care.    Donna Zanetti J [place] DO [facility] Available via Epic secure chat 7am-7pm After these hours, please refer to coverage provider listed on [email or web address] 10/20/2022, 10:32 AM

## 2022-10-21 DIAGNOSIS — E870 Hyperosmolality and hypernatremia: Secondary | ICD-10-CM | POA: Diagnosis not present

## 2022-10-21 MED ORDER — DAKINS (1/4 STRENGTH) 0.125 % EX SOLN
Freq: Every day | CUTANEOUS | Status: AC
  Filled 2022-10-21 (×2): qty 473

## 2022-10-21 NOTE — Consult Note (Addendum)
[place] Nurse wound follow up Wound type: pressure  Measurement:  Sacrum Unstageable Pressure Injury 3 cms x 3 cms x 1.8 cms with 3 cms undermining from 6-12 o'clock noted on this visit; 50% loosely adherent slough, 50% red moist  R medial foot (base of great toe) full thickness 2 cms x 3 cms x 0.1 cms 50% yellow slough 50% red moist  L lateral foot Deep Tissue Injury that evolved to full thickness 1 cm x 1 cm x 0.1 cm 100% pink and moist  R 5th digit Deep Tissue Injury 3 cm x 2 cms intact blister  R hip Deep Tissue Injury that has evolved to Unstageable 6 cm x 2 cm total area with 4 cms x 1.5 cms black devitalized tissue  Deep Tissue Injury noted superior to R hip wound that has now evolved to Unstageable 0.2 cms x 0.3 cms 100% yellow slough   Drainage (amount, consistency, odor) Sacrum with tan malodorous drainage; R medial and lateral foot serosanguinous drainage, minimal tan exudate R hip  Periwound:Sacral wound with pink scarred skin, rim of yellow devitalized tissue from 7 o'clock to 12 o'clock, R hip pink scarred skin  Dressing procedure/placement/frequency:  Continue Dakin's to Sacrum and R hip.  Secure chat to primary MD to consider PT evaluation for hydrotherapy to R hip if aggressive wound care is desired.   R medial foot continue with Medihoney.  L lateral foot clean with NS, apply Silver hydrofiber Kellie Simmering [phone]) cut to fit wound daily, cover with foam.  R hip Unstageable (above larger hip wound) clean with NS, apply Medihoney to base of wound daily, cover with gauze and foam dressing.   All foam dressings to be changed q3 days and prn soiling.     Patient is currently on a low air loss mattress.  Prevalon boots have been ordered for off loading of heels and protection of other pressure points due to patient being contracted.  Plan of care discussed with bedside nurse and primary MD.  WOC will continue to follow this patient weekly for assessment of wounds and to change POC as  needed.    Thank you,    Octavius Shin MSN, RN-BC, [employer]

## 2022-10-21 NOTE — Progress Notes (Signed)
PROGRESS NOTE    Donna Simmons  [id number] DOB: 01/17/1941 DOA: 08/25/2022 PCP: [name], DO    Brief Narrative:   Donna Simmons is a 82 y.o. female with past medical history significant for Alzheimer's dementia, HTN, HLD, permanent atrial fibrillation on EliquisWho presented to [facility] ED on 12/25 from [facility] for failure to thrive.  Patient with decreased oral intake over the past 3 days with increased lethargy.  EMS was activated and patient was brought to the ED for further evaluation.  Workup revealed ESBL UTI and patient completed antibiotic course with IV ertapenem.  Currently awaiting placement per TOC.  Assessment & Plan:   ESBL E. coli UTI Completed 5-day course of ertapenem.  Hypernatremia Presenting with a sodium level of 167, likely in the setting of poor oral intake/dehydration from hypovolemia.  Patient was started on IV fluid hydration with normalization of sodium level.  Continue to encourage increased oral intake.  Unfortunately, hyponatremia confers a very poor prognosis in the setting of dementia and palliative care was consulted; however family not ready for hospice at this time.  Sodium 140 on 2/14. -- Continue intermittent BMPs  Thrombocytosis Etiology in the setting of active infection as above.  Now resolved.  Hypokalemia Repleted, now resolved.  Acute renal failure: Resolved Presented with a creatinine 1.19, baseline 0.7.  Etiology likely secondary to severe dehydration, now resolved following IV fluid hydration.  Creatinine 0.51 on 2/14.  Transaminitis: Resolved Resolved with IV fluid hydration.  Hypoalbuminemia In the setting of poor oral intake, underlying dementia.  Continue to encourage increased oral intake.  Iron deficiency anemia Hemoglobin stable, continue intermittent monitoring of H&H.  Hemoglobin 8.4 on 2/14, stable.  Permanent atrial fibrillation -- Flecainide 50 mg p.o. twice daily -- Eliquis 5 mg p.o. twice daily --  Holding home metoprolol in the setting of soft blood pressures  Insomnia -- Melatonin 5 mg p.o. nightly as needed insomnia  Alzheimer's dementia --Delirium precautions --Get up during the day --Encourage a familiar face to remain present throughout the day --Keep blinds open and lights on during daylight hours --Minimize the use of opioids/benzodiazepines -- Evaluated by palliative care, family not ready for patient to enroll in hospice services yet.  Pressure injury sacrum Pressure Injury 09/04/22 Sacrum Unstageable - Full thickness tissue loss in which the base of the injury is covered by slough (yellow, tan, gray, green or brown) and/or eschar (tan, brown or black) in the wound bed. (Active)  09/04/22 1030  Location: Sacrum  Location Orientation:   Staging: Unstageable - Full thickness tissue loss in which the base of the injury is covered by slough (yellow, tan, gray, green or brown) and/or eschar (tan, brown or black) in the wound bed.  Wound Description (Comments):   Present on Admission: No     Pressure Injury 09/30/22 Hip Right Deep Tissue Pressure Injury - Purple or maroon localized area of discolored intact skin or blood-filled blister due to damage of underlying soft tissue from pressure and/or shear. 2 cms x 1 cm x 0.1 cms 80% red moist 20 (Active)  09/30/22 1200  Location: Hip  Location Orientation: Right  Staging: Deep Tissue Pressure Injury - Purple or maroon localized area of discolored intact skin or blood-filled blister due to damage of underlying soft tissue from pressure and/or shear.  Wound Description (Comments): 2 cms x 1 cm x 0.1 cms 80% red moist 20% yellow  Present on Admission: No     Pressure Injury 10/20/22 Labia Left  Stage 2 -  Partial thickness loss of dermis presenting as a shallow open injury with a red, pink wound bed without slough. Abrasion on left labia (Active)  10/20/22 2017  Location: Labia  Location Orientation: Left  Staging: Stage 2 -   Partial thickness loss of dermis presenting as a shallow open injury with a red, pink wound bed without slough.  Wound Description (Comments): Abrasion on left labia  Present on Admission: No  -- Wound care per nursing, air mattress, frequent offloading    Severe protein calorie malnutrition Body mass index is 17.4 kg/m. Nutrition Status: Nutrition Problem: Severe Malnutrition Etiology: chronic illness Signs/Symptoms: severe muscle depletion, severe fat depletion Interventions: Ensure Enlive (each supplement provides 350kcal and 20 grams of protein), MVI -- Continue to encourage increased oral intake  Disposition Per social work, patient cannot return to her previous SNF due to financial issues, they will also need to pay 30 days out from which spouse is unable to do so.  No family to assist at home.  Difficult placement, Medicaid application pending.  Currently medically stable for discharge once safe discharge plan acquired.  DVT prophylaxis: SCDs Start: 08/25/22 0659 apixaban (ELIQUIS) tablet 5 mg    Code Status: DNR Family Communication: No family present at bedside this morning  Disposition Plan:  Level of care: Med-Surg Status is: Inpatient Remains inpatient appropriate because: Medically stable for discharge once safe disposition found by social work    Consultants:  Palliative care  Procedures:  None  Antimicrobials:  Ertapenem 12/30 - 1/4  Ceftriaxone 12/29 - 12/30   Subjective: Patient seen examined bedside, resting comfortably.  Lying in bed. No family present.  Remains pleasantly confused.  Medically stable for discharge once disposition found per TOC.  No acute events overnight per nursing staff.  Objective: Vitals:   10/20/22 1612 10/20/22 2200 10/21/22 0645 10/21/22 0951  BP: (!) 91/57 92/64 99/70 $ (!) 104/59  Pulse: 97 90 89 83  Resp: 16 15 16 16  $ Temp: 97.9 F (36.6 C) 98 F (36.7 C) 99.4 F (37.4 C) 97.8 F (36.6 C)  TempSrc: Oral Axillary  Axillary Oral  SpO2: 99% 100% 100% 100%  Weight:      Height:        Intake/Output Summary (Last 24 hours) at 10/21/2022 1100 Last data filed at 10/21/2022 [id number] Gross per 24 hour  Intake 360 ml  Output --  Net 360 ml     Filed Weights   10/12/22 0451 10/15/22 0500 10/20/22 0439  Weight: 63.2 kg 43.2 kg 43.1 kg    Examination:  Physical Exam: GEN: NAD, alert,, pleasantly confused, thin/cachectic in appearance HEENT: NCAT, PERRL, EOMI, sclera clear, dry mucous membranes PULM: CTAB w/o wheezes/crackles, normal respiratory effort, on room air CV: RRR w/o M/G/R GI: abd soft, NTND, NABS MSK: no peripheral edema Integumentary: Sacral/right hip pressure wound unstageable, dressing in place, otherwise no other concerning rashes/lesions/wounds noted on exposed skin surfaces.      Data Reviewed: I have personally reviewed following labs and imaging studies  CBC: Recent Labs  Lab 10/15/22 0057  WBC 8.2  HGB 8.4*  HCT 27.7*  MCV 73.7*  PLT [id number]*   Basic Metabolic Panel: Recent Labs  Lab 10/15/22 0057  NA 140  K 3.7  CL 104  CO2 27  GLUCOSE 100*  BUN 23  CREATININE 0.51  CALCIUM 8.5*   GFR: Estimated Creatinine Clearance: 38.8 mL/min (by C-G formula based on SCr of 0.51 mg/dL). Liver Function Tests: No results for  input(s): "AST", "ALT", "ALKPHOS", "BILITOT", "PROT", "ALBUMIN" in the last 168 hours.  No results for input(s): "LIPASE", "AMYLASE" in the last 168 hours. No results for input(s): "AMMONIA" in the last 168 hours. Coagulation Profile: No results for input(s): "INR", "PROTIME" in the last 168 hours. Cardiac Enzymes: No results for input(s): "CKTOTAL", "CKMB", "CKMBINDEX", "TROPONINI" in the last 168 hours. BNP (last 3 results) No results for input(s): "PROBNP" in the last 8760 hours. HbA1C: No results for input(s): "HGBA1C" in the last 72 hours. CBG: No results for input(s): "GLUCAP" in the last 168 hours. Lipid Profile: No results for input(s):  "CHOL", "HDL", "LDLCALC", "TRIG", "CHOLHDL", "LDLDIRECT" in the last 72 hours. Thyroid Function Tests: No results for input(s): "TSH", "T4TOTAL", "FREET4", "T3FREE", "THYROIDAB" in the last 72 hours. Anemia Panel: No results for input(s): "VITAMINB12", "FOLATE", "FERRITIN", "TIBC", "IRON", "RETICCTPCT" in the last 72 hours. Sepsis Labs: No results for input(s): "PROCALCITON", "LATICACIDVEN" in the last 168 hours.  No results found for this or any previous visit (from the past 240 hour(s)).       Radiology Studies: No results found.      Scheduled Meds:  apixaban  5 mg Oral BID   feeding supplement  237 mL Oral TID BM   ferrous sulfate  325 mg Oral QODAY   flecainide  50 mg Oral BID   leptospermum manuka honey  1 Application Topical Daily   multivitamin with minerals  1 tablet Oral Daily   pantoprazole  40 mg Oral Daily   Continuous Infusions:   LOS: 56 days    Time spent: 42 minutes spent on chart review, discussion with nursing staff, consultants, updating family and interview/physical exam; more than 50% of that time was spent in counseling and/or coordination of care.    Donna Simmons J [place] DO [facility] Available via Epic secure chat 7am-7pm After these hours, please refer to coverage provider listed on [email or web address] 10/21/2022, 11:00 AM

## 2022-10-21 NOTE — TOC Progression Note (Signed)
Transition of Care [facility]) - Progression Note    Patient Details  Name: Donna Simmons MRN: [id number] Date of Birth: 22-Sep-1940  Transition of Care [facility]) CM/SW Contact  Joanne Chars, LCSW Phone Number: 10/21/2022, 9:48 AM  Clinical Narrative:   [facility].  They are not able to accept pt under LOG.         Expected Discharge Plan and Services                                               Social Determinants of Health (SDOH) Interventions SDOH Screenings   Food Insecurity: Unknown (10/19/2022)  Alcohol Screen: Low Risk  (04/12/2019)  Depression (PHQ2-9): Low Risk  (11/05/2020)  Tobacco Use: Low Risk  (10/19/2022)    Readmission Risk Interventions     No data to display

## 2022-10-22 DIAGNOSIS — E870 Hyperosmolality and hypernatremia: Secondary | ICD-10-CM | POA: Diagnosis not present

## 2022-10-22 LAB — CBC
HCT: 29.5 % — ABNORMAL LOW (ref 36.0–46.0)
Hemoglobin: 8.9 g/dL — ABNORMAL LOW (ref 12.0–15.0)
MCH: 22.4 pg — ABNORMAL LOW (ref 26.0–34.0)
MCHC: 30.2 g/dL (ref 30.0–36.0)
MCV: 74.1 fL — ABNORMAL LOW (ref 80.0–100.0)
Platelets: 446 10*3/uL — ABNORMAL HIGH (ref 150–400)
RBC: 3.98 MIL/uL (ref 3.87–5.11)
RDW: 19.9 % — ABNORMAL HIGH (ref 11.5–15.5)
WBC: 5.6 10*3/uL (ref 4.0–10.5)
nRBC: 0 % (ref 0.0–0.2)

## 2022-10-22 LAB — BASIC METABOLIC PANEL
Anion gap: 10 (ref 5–15)
BUN: 16 mg/dL (ref 8–23)
CO2: 28 mmol/L (ref 22–32)
Calcium: 8.7 mg/dL — ABNORMAL LOW (ref 8.9–10.3)
Chloride: 103 mmol/L (ref 98–111)
Creatinine, Ser: 0.56 mg/dL (ref 0.44–1.00)
GFR, Estimated: >60 mL/min (ref >60)
Glucose, Bld: 101 mg/dL — ABNORMAL HIGH (ref 70–99)
Potassium: 3.6 mmol/L (ref 3.5–5.1)
Sodium: 141 mmol/L (ref 135–145)

## 2022-10-22 LAB — MAGNESIUM: Magnesium: 2.2 mg/dL (ref 1.7–2.4)

## 2022-10-22 NOTE — Progress Notes (Signed)
PROGRESS NOTE    Donna Simmons  [id number] DOB: [birth year] DOA: 08/25/2022 PCP: [name], DO    Brief Narrative:   Donna Simmons is a 82 y.o. female with past medical history significant for Alzheimer's dementia, HTN, HLD, permanent atrial fibrillation on EliquisWho [facility] from [facility] for failure to thrive.  Patient with decreased oral intake over the past 3 days with increased lethargy.  EMS was activated and patient was brought to the ED for further evaluation.  Workup revealed ESBL UTI and patient completed antibiotic course with IV ertapenem.  Currently awaiting placement per TOC.  Assessment & Plan:   ESBL E. coli UTI Completed 5-day course of ertapenem.  Hypernatremia Presenting with a sodium level of 167, likely in the setting of poor oral intake/dehydration from hypovolemia.  Patient was started on IV fluid hydration with normalization of sodium level.  Continue to encourage increased oral intake.  Unfortunately, hyponatremia confers a very poor prognosis in the setting of dementia and palliative care was consulted; however family not ready for hospice at this time.  Sodium 140 on 2/14. -- Continue intermittent BMPs  Thrombocytosis Etiology in the setting of active infection as above.  Now resolved.  Hypokalemia Repleted, now resolved.  Acute renal failure: Resolved Presented with a creatinine 1.19, baseline 0.7.  Etiology likely secondary to severe dehydration, now resolved following IV fluid hydration.  Creatinine 0.51 on 2/14.  Transaminitis: Resolved Resolved with IV fluid hydration.  Hypoalbuminemia In the setting of poor oral intake, underlying dementia.  Continue to encourage increased oral intake.  Iron deficiency anemia Hemoglobin stable, continue intermittent monitoring of H&H.  Hemoglobin 8.4 on 2/14, stable.  Permanent atrial fibrillation -- Flecainide 50 mg p.o. twice daily -- Eliquis 5 mg p.o. twice daily --  Holding home metoprolol in the setting of soft blood pressures  Insomnia -- Melatonin 5 mg p.o. nightly as needed insomnia  Alzheimer's dementia --Delirium precautions --Get up during the day --Encourage a familiar face to remain present throughout the day --Keep blinds open and lights on during daylight hours --Minimize the use of opioids/benzodiazepines -- Evaluated by palliative care, family not ready for patient to enroll in hospice services yet.  Pressure injury  Sacrum Unstageable Pressure Injury 3 cms x 3 cms x 1.8 cms with 3 cms undermining from 6-12 o'clock noted on this visit; 50% loosely adherent slough, 50% red moist  R medial foot (base of great toe) full thickness 2 cms x 3 cms x 0.1 cms 50% yellow slough 50% red moist  L lateral foot Deep Tissue Injury that evolved to full thickness 1 cm x 1 cm x 0.1 cm 100% pink and moist  R 5th digit Deep Tissue Injury 3 cm x 2 cms intact blister  R hip Deep Tissue Injury that has evolved to Unstageable 6 cm x 2 cm total area with 4 cms x 1.5 cms black devitalized tissue  Deep Tissue Injury noted superior to R hip wound that has now evolved to Unstageable 0.2 cms x 0.3 cms 100% yellow slough    Drainage (amount, consistency, odor) Sacrum with tan malodorous drainage; R medial and lateral foot serosanguinous drainage, minimal tan exudate R hip  Periwound:Sacral wound with pink scarred skin, rim of yellow devitalized tissue from 7 o'clock to 12 o'clock, R hip pink scarred skin  Dressing procedure/placement/frequency:  Continue Dakin's to Sacrum and R hip.  PT evaluation for hydrotherapy to R hip   R medial foot  continue with Medihoney.  L lateral foot clean with NS, apply Silver hydrofiber Kellie Simmering [phone]) cut to fit wound daily, cover with foam.  R hip Unstageable (above larger hip wound) clean with NS, apply Medihoney to base of wound daily, cover with gauze and foam dressing.   All foam dressings to be changed q3 days and prn soiling.        Patient is currently on a low air loss mattress.  Prevalon boots have been ordered for off loading of heels and protection of other pressure points due to patient being contracted.  Severe protein calorie malnutrition Body mass index is 17.4 kg/m. Nutrition Status: Nutrition Problem: Severe Malnutrition Etiology: chronic illness Signs/Symptoms: severe muscle depletion, severe fat depletion Interventions: Ensure Enlive (each supplement provides 350kcal and 20 grams of protein), MVI -- Continue to encourage increased oral intake; overall poor prognosis  Disposition Per social work, patient cannot return to her previous SNF due to financial issues, they will also need to pay 30 days out from which spouse is unable to do so.  No family to assist at home.  Difficult placement, Medicaid application pending.  Currently medically stable for discharge once safe discharge plan acquired.  DVT prophylaxis: SCDs Start: 08/25/22 0659 apixaban (ELIQUIS) tablet 5 mg    Code Status: DNR Family Communication: No family present at bedside this morning  Disposition Plan:  Level of care: Med-Surg Status is: Inpatient Remains inpatient appropriate because: Medically stable for discharge once safe disposition found by social work    Consultants:  Palliative care  Procedures:  None  Antimicrobials:  Ertapenem 12/30 - 1/4  Ceftriaxone 12/29 - 12/30   Subjective: Patient seen examined bedside, resting comfortably.  Lying in bed. No family present.  Remains pleasantly confused and contracted.  Medically stable for discharge once disposition found per TOC.  No acute events overnight per nursing staff.  Objective: Vitals:   10/21/22 0951 10/21/22 1944 10/22/22 0435 10/22/22 0753  BP: (!) 104/59 109/69 (!) 98/56 (!) 96/57  Pulse: 83 98 81 68  Resp: 16 17 18 16  $ Temp: 97.8 F (36.6 C) 98.6 F (37 C) 97.7 F (36.5 C) 98 F (36.7 C)  TempSrc: Oral Oral Oral Oral  SpO2: 100% 100% 98% 100%   Weight:      Height:        Intake/Output Summary (Last 24 hours) at 10/22/2022 1026 Last data filed at 10/22/2022 [id number] Gross per 24 hour  Intake 240 ml  Output 240 ml  Net 0 ml     Filed Weights   10/12/22 0451 10/15/22 0500 10/20/22 0439  Weight: 63.2 kg 43.2 kg 43.1 kg    Examination:  Physical Exam: GEN: NAD, alert,, pleasantly confused, thin/cachectic in appearance, contracted HEENT: NCAT, PERRL, EOMI, sclera clear, dry mucous membranes PULM: CTAB w/o wheezes/crackles, normal respiratory effort, on room air CV: RRR w/o M/G/R GI: abd soft, NTND, NABS MSK: no peripheral edema Integumentary: Sacral/right hip pressure wound unstageable, dressing in place, otherwise no other concerning rashes/lesions/wounds noted on exposed skin surfaces.      Data Reviewed: I have personally reviewed following labs and imaging studies  CBC: Recent Labs  Lab 10/22/22 0325  WBC 5.6  HGB 8.9*  HCT 29.5*  MCV 74.1*  PLT [id number]*   Basic Metabolic Panel: Recent Labs  Lab 10/22/22 0325  NA 141  K 3.6  CL 103  CO2 28  GLUCOSE 101*  BUN 16  CREATININE 0.56  CALCIUM 8.7*  MG 2.2   GFR:  Estimated Creatinine Clearance: 38.8 mL/min (by C-G formula based on SCr of 0.56 mg/dL). Liver Function Tests: No results for input(s): "AST", "ALT", "ALKPHOS", "BILITOT", "PROT", "ALBUMIN" in the last 168 hours.  No results for input(s): "LIPASE", "AMYLASE" in the last 168 hours. No results for input(s): "AMMONIA" in the last 168 hours. Coagulation Profile: No results for input(s): "INR", "PROTIME" in the last 168 hours. Cardiac Enzymes: No results for input(s): "CKTOTAL", "CKMB", "CKMBINDEX", "TROPONINI" in the last 168 hours. BNP (last 3 results) No results for input(s): "PROBNP" in the last 8760 hours. HbA1C: No results for input(s): "HGBA1C" in the last 72 hours. CBG: No results for input(s): "GLUCAP" in the last 168 hours. Lipid Profile: No results for input(s): "CHOL", "HDL",  "LDLCALC", "TRIG", "CHOLHDL", "LDLDIRECT" in the last 72 hours. Thyroid Function Tests: No results for input(s): "TSH", "T4TOTAL", "FREET4", "T3FREE", "THYROIDAB" in the last 72 hours. Anemia Panel: No results for input(s): "VITAMINB12", "FOLATE", "FERRITIN", "TIBC", "IRON", "RETICCTPCT" in the last 72 hours. Sepsis Labs: No results for input(s): "PROCALCITON", "LATICACIDVEN" in the last 168 hours.  No results found for this or any previous visit (from the past 240 hour(s)).       Radiology Studies: No results found.      Scheduled Meds:  apixaban  5 mg Oral BID   feeding supplement  237 mL Oral TID BM   ferrous sulfate  325 mg Oral QODAY   flecainide  50 mg Oral BID   leptospermum manuka honey  1 Application Topical Daily   multivitamin with minerals  1 tablet Oral Daily   pantoprazole  40 mg Oral Daily   sodium hypochlorite   Irrigation Daily   Continuous Infusions:   LOS: 57 days    Time spent: 42 minutes spent on chart review, discussion with nursing staff, consultants, updating family and interview/physical exam; more than 50% of that time was spent in counseling and/or coordination of care.    Sherry Rogus J [place] DO [facility] Available via Epic secure chat 7am-7pm After these hours, please refer to coverage provider listed on [email or web address] 10/22/2022, 10:26 AM

## 2022-10-22 NOTE — Progress Notes (Signed)
Patient alert and oriented x1, VSS, room air. BLE contractures.  Wound care and dressing changes. BUE soft mittens in use.  Total feeder, poor PO intake. NAD, handoff to night shift nurse.

## 2022-10-22 NOTE — Progress Notes (Signed)
Physical Therapy Wound Treatment Patient Details  Name: Donna Simmons MRN: [id number] Date of Birth: 15-Mar-1941  Today's Date: 10/22/2022 Time: [id number] Time Calculation (min): 47 min  Subjective  Subjective Assessment Subjective: pt smiles in response to therapist Patient and Family Stated Goals: placement in [facility] Prior Treatments: dressing changes  Pain Score:  Pt given Tylenol prior to session. Only one bout of pt swatting at therapist during debridement, otherwise does not exhibit any sign of increased pain.   Wound Assessment  Pressure Injury 09/30/22 Hip Right Deep Tissue Pressure Injury - Purple or maroon localized area of discolored intact skin or blood-filled blister due to damage of underlying soft tissue from pressure and/or shear. 2 cms x 1 cm x 0.1 cms 80% red moist 20 (Active)  Wound Image   10/22/22 1100  Dressing Type Foam - Lift dressing to assess site every shift 10/22/22 1100  Dressing Clean, Dry, Intact;Changed 10/22/22 1100  Dressing Change Frequency Daily 10/22/22 1100  State of Healing Eschar 10/22/22 1100  Site / Wound Assessment Black 10/22/22 1100  % Wound base Red or Granulating 10% 10/18/22 0735  % Wound base Yellow/Fibrinous Exudate 5% 10/22/22 1100  % Wound base Black/Eschar 95% 10/22/22 1100  Peri-wound Assessment Pink;Erythema (blanchable);Denuded 10/22/22 1100  Wound Length (cm) 3.8 cm 10/22/22 1100  Wound Width (cm) 1.9 cm 10/22/22 1100  Wound Depth (cm) 0.1 cm 10/06/22 1300  Wound Surface Area (cm^2) 7.22 cm^2 10/22/22 1100  Wound Volume (cm^3) 0.8 cm^3 10/06/22 1300  Drainage Amount Scant 10/22/22 1100  Drainage Description Serosanguineous 10/22/22 1100  Treatment Irrigation;Debridement (Selective);Other (Comment) 10/22/22 1100     Pressure Injury 10/20/22 Labia Left Stage 2 -  Partial thickness loss of dermis presenting as a shallow open injury with a red, pink wound bed without slough. Abrasion on left labia (Active)  Dressing Type None  10/21/22 2025  Dressing Other (Comment) 10/20/22 2017  Dressing Change Frequency Other (Comment) 10/20/22 2017  State of Healing Epithelialized 10/21/22 2025  Site / Wound Assessment Pink;Red;Yellow 10/21/22 2025  Peri-wound Assessment Intact 10/21/22 2025  Drainage Amount None 10/21/22 2025  Drainage Description Serosanguineous 10/20/22 2017  Treatment Cleansed;Other (Comment) 10/20/22 2017      Selective Debridement (non-excisional) Selective Debridement (non-excisional) - Location: R hip Selective Debridement (non-excisional) - Tools Used: Forceps, Scalpel Selective Debridement (non-excisional) - Tissue Removed: hard black/brown eschar    Wound Assessment and Plan  Wound Therapy - Assess/Plan/Recommendations Wound Therapy - Clinical Statement: Pt supine with contracted hips, knees and ankles, unable to lay on back due to contractions. Incontinent of stool on entry. PT only treating patch of eschar on R hip. PT able to debride layer of eschar, and underlying eschar will likely soften with Dakin's application. Pt will benefit from eschar removal to reduce bioburden and promote wound healing. PT will follow back for wound care. Wound Therapy - Functional Problem List: pt unable to turn herself in the bed and has due to cognitive decline is not able to understand need for offloading Factors Delaying/Impairing Wound Healing: Immobility, Incontinence, Multiple medical problems Hydrotherapy Plan: Debridement, Dressing change, Patient/family education Wound Therapy - Frequency:  (2x/wk) Wound Therapy - Follow Up Recommendations: dressing changes by RN  Wound Therapy Goals- Improve the function of patient's integumentary system by progressing the wound(s) through the phases of wound healing (inflammation - proliferation - remodeling) by: Wound Therapy Goals - Improve the function of patient's integumentary system by progressing the wound(s) through the phases of wound healing by: Decrease  Necrotic  Tissue to: 20 Decrease Necrotic Tissue - Progress: Goal set today Increase Granulation Tissue to: 80 Increase Granulation Tissue - Progress: Goal set today Goals/treatment plan/discharge plan were made with and agreed upon by patient/family: No, Patient unable to participate in goals/treatment/discharge plan and family unavailable Time For Goal Achievement: 7 days Wound Therapy - Potential for Goals: Good  Goals will be updated until maximal potential achieved or discharge criteria met.  Discharge criteria: when goals achieved, discharge from hospital, MD decision/surgical intervention, no progress towards goals, refusal/missing three consecutive treatments without notification or medical reason.  GP     Charges PT Wound Care Charges $Wound Debridement up to 20 cm: < or equal to 20 cm $ Wound Debridement each add'l 20 sqcm: 1 $PT Hydrotherapy Visit: 1 Visit     Jaron Czarnecki B. Migdalia Dk PT, DPT Acute Rehabilitation Services Please use secure chat or  Call Office [phone]   [place] 10/22/2022, 11:50 AM

## 2022-10-22 NOTE — TOC Progression Note (Signed)
Transition of Care [facility]) - Progression Note    Patient Details  Name: MONTOYA PRUDENT MRN: [id number] Date of Birth: 05-08-1941  Transition of Care [facility]) CM/SW Contact  Coralee P[place], [place] Phone Number: 10/22/2022, 11:08 AM  Clinical Narrative:     CSW notified that [facility] are unable to offer an LOG bed at this time. CSW attempted other facilities, but none found that can take an LOG. Medical team notified. TOC will continue to follow. [facility] declined [facility] declined [facility] declined (cannot do LTC unless pt already has disability) [facility] declined [facility]- does not take [facility]- does not take LOG [facility] declined [facility]- do not take [facility] declined [facility]- does not take [facility]- still reviewing.        Expected Discharge Plan and Services                                               Social Determinants of Health (SDOH) Interventions SDOH Screenings   Food Insecurity: Unknown (10/19/2022)  Alcohol Screen: Low Risk  (04/12/2019)  Depression (PHQ2-9): Low Risk  (11/05/2020)  Tobacco Use: Low Risk  (10/19/2022)    Readmission Risk Interventions     No data to display

## 2022-10-23 DIAGNOSIS — E870 Hyperosmolality and hypernatremia: Secondary | ICD-10-CM | POA: Diagnosis not present

## 2022-10-23 NOTE — Plan of Care (Signed)

## 2022-10-23 NOTE — Progress Notes (Signed)
PROGRESS NOTE    Donna Simmons  [id number] DOB: Dec 10, 1940 DOA: 08/25/2022 PCP: [name], DO    Brief Narrative:   Donna Simmons is a 82 y.o. female with past medical history significant for Alzheimer's dementia, HTN, HLD, permanent atrial fibrillation on EliquisWho presented to [facility] ED on 12/25 from [facility] for failure to thrive.  Patient with decreased oral intake over the past 3 days with increased lethargy.  EMS was activated and patient was brought to the ED for further evaluation.  Workup revealed ESBL UTI and patient completed antibiotic course with IV ertapenem.  Currently awaiting placement per TOC.  Assessment & Plan:   ESBL E. coli UTI Completed 5-day course of ertapenem.  Hypernatremia Presenting with a sodium level of 167, likely in the setting of poor oral intake/dehydration from hypovolemia.  Patient was started on IV fluid hydration with normalization of sodium level.  Continue to encourage increased oral intake.  Unfortunately, hyponatremia confers a very poor prognosis in the setting of dementia and palliative care was consulted; however family not ready for hospice at this time.  Sodium 141 on 2/21. -- Continue intermittent BMPs  Thrombocytosis Etiology in the setting of active infection as above.  Now resolved.  Hypokalemia Repleted, now resolved.  Acute renal failure: Resolved Presented with a creatinine 1.19, baseline 0.7.  Etiology likely secondary to severe dehydration, now resolved following IV fluid hydration.  Creatinine 0.56 on 2/21.  Transaminitis: Resolved Resolved with IV fluid hydration.  Hypoalbuminemia In the setting of poor oral intake, underlying dementia.  Continue to encourage increased oral intake.  Iron deficiency anemia Hemoglobin stable, continue intermittent monitoring of H&H.  Hemoglobin 8.9 on 2/21, stable.  Permanent atrial fibrillation -- Flecainide 50 mg p.o. twice daily -- Eliquis 5 mg p.o. twice daily --  Holding home metoprolol in the setting of soft blood pressures  Insomnia -- Melatonin 5 mg p.o. nightly as needed insomnia  Alzheimer's dementia --Delirium precautions --Get up during the day --Encourage a familiar face to remain present throughout the day --Keep blinds open and lights on during daylight hours --Minimize the use of opioids/benzodiazepines -- Evaluated by palliative care, family not ready for patient to enroll in hospice services yet.  Pressure injury  Sacrum Unstageable Pressure Injury 3 cms x 3 cms x 1.8 cms with 3 cms undermining from 6-12 o'clock noted on this visit; 50% loosely adherent slough, 50% red moist  R medial foot (base of great toe) full thickness 2 cms x 3 cms x 0.1 cms 50% yellow slough 50% red moist  L lateral foot Deep Tissue Injury that evolved to full thickness 1 cm x 1 cm x 0.1 cm 100% pink and moist  R 5th digit Deep Tissue Injury 3 cm x 2 cms intact blister  R hip Deep Tissue Injury that has evolved to Unstageable 6 cm x 2 cm total area with 4 cms x 1.5 cms black devitalized tissue  Deep Tissue Injury noted superior to R hip wound that has now evolved to Unstageable 0.2 cms x 0.3 cms 100% yellow slough    Drainage (amount, consistency, odor) Sacrum with tan malodorous drainage; R medial and lateral foot serosanguinous drainage, minimal tan exudate R hip  Periwound:Sacral wound with pink scarred skin, rim of yellow devitalized tissue from 7 o'clock to 12 o'clock, R hip pink scarred skin  Dressing procedure/placement/frequency:  Continue Dakin's to Sacrum and R hip.  PT evaluation for hydrotherapy to R hip   R medial foot  continue with Medihoney.  L lateral foot clean with NS, apply Silver hydrofiber Kellie Simmering [phone]) cut to fit wound daily, cover with foam.  R hip Unstageable (above larger hip wound) clean with NS, apply Medihoney to base of wound daily, cover with gauze and foam dressing.   All foam dressings to be changed q3 days and prn soiling.        Patient is currently on a low air loss mattress.  Prevalon boots have been ordered for off loading of heels and protection of other pressure points due to patient being contracted.  Severe protein calorie malnutrition Body mass index is 17.4 kg/m. Nutrition Status: Nutrition Problem: Severe Malnutrition Etiology: chronic illness Signs/Symptoms: severe muscle depletion, severe fat depletion Interventions: Ensure Enlive (each supplement provides 350kcal and 20 grams of protein), MVI -- Continue to encourage increased oral intake; overall poor prognosis  Disposition Per social work, patient cannot return to her previous SNF due to financial issues, they will also need to pay 30 days out from which spouse is unable to do so.  No family to assist at home.  Difficult placement, Medicaid application pending.  Currently medically stable for discharge once safe discharge plan acquired.  DVT prophylaxis: SCDs Start: 08/25/22 0659 apixaban (ELIQUIS) tablet 5 mg    Code Status: DNR Family Communication: No family present at bedside this morning  Disposition Plan:  Level of care: Med-Surg Status is: Inpatient Remains inpatient appropriate because: Medically stable for discharge once safe disposition found by social work    Consultants:  Palliative care  Procedures:  None  Antimicrobials:  Ertapenem 12/30 - 1/4  Ceftriaxone 12/29 - 12/30   Subjective: Patient seen examined bedside, resting comfortably.  Lying in bed.  Sleeping but arousable.  No family present.  Remains pleasantly confused and contracted.  Medically stable for discharge once disposition found per TOC.  No acute events overnight per nursing staff.  Objective: Vitals:   10/22/22 1621 10/22/22 2115 10/23/22 0516 10/23/22 0959  BP: 102/64 95/63 114/71 94/77  Pulse: 88 90 86 92  Resp: 17 17 17 16  $ Temp: 97.8 F (36.6 C) 98.4 F (36.9 C)  97.7 F (36.5 C)  TempSrc: Oral Oral  Oral  SpO2: 99% 98% 100% 98%   Weight:      Height:        Intake/Output Summary (Last 24 hours) at 10/23/2022 1059 Last data filed at 10/23/2022 1000 Gross per 24 hour  Intake 0 ml  Output --  Net 0 ml     Filed Weights   10/12/22 0451 10/15/22 0500 10/20/22 0439  Weight: 63.2 kg 43.2 kg 43.1 kg    Examination:  Physical Exam: GEN: NAD, alert,, pleasantly confused, thin/cachectic in appearance, contracted HEENT: NCAT, PERRL, EOMI, sclera clear, dry mucous membranes PULM: CTAB w/o wheezes/crackles, normal respiratory effort, on room air CV: RRR w/o M/G/R GI: abd soft, NTND, NABS MSK: no peripheral edema, lower extremities contracted Integumentary: Sacral/right hip pressure wound unstageable, dressing in place, otherwise no other concerning rashes/lesions/wounds noted on exposed skin surfaces.      Data Reviewed: I have personally reviewed following labs and imaging studies  CBC: Recent Labs  Lab 10/22/22 0325  WBC 5.6  HGB 8.9*  HCT 29.5*  MCV 74.1*  PLT [id number]*   Basic Metabolic Panel: Recent Labs  Lab 10/22/22 0325  NA 141  K 3.6  CL 103  CO2 28  GLUCOSE 101*  BUN 16  CREATININE 0.56  CALCIUM 8.7*  MG 2.2  GFR: Estimated Creatinine Clearance: 38.8 mL/min (by C-G formula based on SCr of 0.56 mg/dL). Liver Function Tests: No results for input(s): "AST", "ALT", "ALKPHOS", "BILITOT", "PROT", "ALBUMIN" in the last 168 hours.  No results for input(s): "LIPASE", "AMYLASE" in the last 168 hours. No results for input(s): "AMMONIA" in the last 168 hours. Coagulation Profile: No results for input(s): "INR", "PROTIME" in the last 168 hours. Cardiac Enzymes: No results for input(s): "CKTOTAL", "CKMB", "CKMBINDEX", "TROPONINI" in the last 168 hours. BNP (last 3 results) No results for input(s): "PROBNP" in the last 8760 hours. HbA1C: No results for input(s): "HGBA1C" in the last 72 hours. CBG: No results for input(s): "GLUCAP" in the last 168 hours. Lipid Profile: No results for  input(s): "CHOL", "HDL", "LDLCALC", "TRIG", "CHOLHDL", "LDLDIRECT" in the last 72 hours. Thyroid Function Tests: No results for input(s): "TSH", "T4TOTAL", "FREET4", "T3FREE", "THYROIDAB" in the last 72 hours. Anemia Panel: No results for input(s): "VITAMINB12", "FOLATE", "FERRITIN", "TIBC", "IRON", "RETICCTPCT" in the last 72 hours. Sepsis Labs: No results for input(s): "PROCALCITON", "LATICACIDVEN" in the last 168 hours.  No results found for this or any previous visit (from the past 240 hour(s)).       Radiology Studies: No results found.      Scheduled Meds:  apixaban  5 mg Oral BID   feeding supplement  237 mL Oral TID BM   ferrous sulfate  325 mg Oral QODAY   flecainide  50 mg Oral BID   leptospermum manuka honey  1 Application Topical Daily   multivitamin with minerals  1 tablet Oral Daily   pantoprazole  40 mg Oral Daily   sodium hypochlorite   Irrigation Daily   Continuous Infusions:   LOS: 58 days    Time spent: 42 minutes spent on chart review, discussion with nursing staff, consultants, updating family and interview/physical exam; more than 50% of that time was spent in counseling and/or coordination of care.    Charnetta Wulff J [place] DO [facility] Available via Epic secure chat 7am-7pm After these hours, please refer to coverage provider listed on [email or web address] 10/23/2022, 10:59 AM

## 2022-10-24 DIAGNOSIS — E870 Hyperosmolality and hypernatremia: Secondary | ICD-10-CM | POA: Diagnosis not present

## 2022-10-24 NOTE — Plan of Care (Signed)

## 2022-10-24 NOTE — Progress Notes (Signed)
Physical Therapy Wound Treatment Patient Details  Name: Donna Simmons MRN: [id number] Date of Birth: 30-May-1941  Today's Date: 10/24/2022 Time: 1016-1058 Time Calculation (min): 42 min  Subjective  Subjective Assessment Subjective: very sleepy today Patient and Family Stated Goals: placement in LTACH Prior Treatments: dressing changes  Pain Score:  Pt given pain medication prior to session. Drowsy throughout. One time facial pain 6/10 and swatting at therapist during debridement. But then fell back to sleep.   Wound Assessment  Pressure Injury 09/30/22 Hip Right Deep Tissue Pressure Injury - Purple or maroon localized area of discolored intact skin or blood-filled blister due to damage of underlying soft tissue from pressure and/or shear. 2 cms x 1 cm x 0.1 cms 80% red moist 20 (Active)  Wound Image   10/22/22 1100  Dressing Type Foam - Lift dressing to assess site every shift;Gauze (Comment);Barrier Film (skin prep) 10/24/22 1100  Dressing Clean, Dry, Intact;Changed 10/24/22 1100  Dressing Change Frequency Daily 10/24/22 1100  State of Healing Eschar 10/24/22 1100  Site / Wound Assessment Black 10/24/22 1100  % Wound base Red or Granulating 10% 10/18/22 0735  % Wound base Yellow/Fibrinous Exudate 10% 10/24/22 1100  % Wound base Black/Eschar 90% 10/24/22 1100  Peri-wound Assessment Pink;Erythema (blanchable);Denuded 10/24/22 1100  Wound Length (cm) 3.8 cm 10/22/22 1100  Wound Width (cm) 1.9 cm 10/22/22 1100  Wound Depth (cm) 0.1 cm 10/06/22 1300  Wound Surface Area (cm^2) 7.22 cm^2 10/22/22 1100  Wound Volume (cm^3) 0.8 cm^3 10/06/22 1300  Margins Unattached edges (unapproximated) 10/24/22 1100  Drainage Amount Scant 10/24/22 1100  Drainage Description Serosanguineous 10/24/22 1100  Treatment Debridement (Selective);Irrigation 10/24/22 1100      Selective Debridement (non-excisional) Selective Debridement (non-excisional) - Location: R hip Selective Debridement  (non-excisional) - Tools Used: Forceps, Scalpel Selective Debridement (non-excisional) - Tissue Removed: hard black/brown eschar and attached yellow slough from the edge of the wound    Wound Assessment and Plan  Wound Therapy - Assess/Plan/Recommendations Wound Therapy - Clinical Statement: Pt very sleepy today, bats at therapist during debridement but otherwise does not stir. hardened eschar has softened with the Medihoney and able to remove eschar from margin. No evidence of infection so shifted to use of Medihoney to protect periwound from Dakin's. Pt will continue to benefit from removal of necrotic tissue to reduce bioburden and promote wound healing. PT will follow back for wound care. Wound Therapy - Functional Problem List: pt unable to turn herself in the bed and has due to cognitive decline is not able to understand need for offloading Factors Delaying/Impairing Wound Healing: Immobility, Incontinence, Multiple medical problems Hydrotherapy Plan: Debridement, Dressing change, Patient/family education Wound Therapy - Frequency:  (2x/wk) Wound Therapy - Follow Up Recommendations: dressing changes by RN  Wound Therapy Goals- Improve the function of patient's integumentary system by progressing the wound(s) through the phases of wound healing (inflammation - proliferation - remodeling) by: Wound Therapy Goals - Improve the function of patient's integumentary system by progressing the wound(s) through the phases of wound healing by: Decrease Necrotic Tissue to: 20 Decrease Necrotic Tissue - Progress: Progressing toward goal Increase Granulation Tissue to: 80 Increase Granulation Tissue - Progress: Progressing toward goal Goals/treatment plan/discharge plan were made with and agreed upon by patient/family: No, Patient unable to participate in goals/treatment/discharge plan and family unavailable Time For Goal Achievement: 7 days Wound Therapy - Potential for Goals: Good  Goals will be  updated until maximal potential achieved or discharge criteria met.  Discharge criteria: when goals  achieved, discharge from hospital, MD decision/surgical intervention, no progress towards goals, refusal/missing three consecutive treatments without notification or medical reason.  GP     Charges PT Wound Care Charges $Wound Debridement up to 20 cm: < or equal to 20 cm $ Wound Debridement each add'l 20 sqcm: 1 $PT Hydrotherapy Dressing: 1 dressing $PT Hydrotherapy Visit: 1 Visit      Swayzie Choate B. Migdalia Dk PT, DPT Acute Rehabilitation Services Please use secure chat or  Call Office [phone]  [place] 10/24/2022, 11:44 AM

## 2022-10-24 NOTE — Progress Notes (Addendum)
PROGRESS NOTE  Donna Simmons  DOB: Jul 15, 1941  PCP: [name], DO [id number]  DOA: 08/25/2022  LOS: 66 days  Hospital Day: 76  Brief narrative: VALORIE FLIKKEMA is a 82 y.o. female with PMH significant for Alzheimer's dementia, HTN, HLD, permanent A-fib on Eliquis, GERD. 12/25, patient was sent to ED from [facility] for poor oral intake, increased lethargy, failure to thrive. Workup showed hypernatremia of 167, AKI.  Urine culture grew ESBL E. coli. Patient completed the course of IV ertapenem.  She could not return to SNF because of unpaid bills.  Family unable to take care of her at home.  Medicaid pending. Difficult to place.  Subjective: Patient was seen and examined this morning.   Lying down in bed.  On mittens.  Not in distress.  Assessment and plan: ESBL E. coli UTI Completed 5 days course of ertapenem   Hypernatremia Presented with sodium of 167 due to poor p.o. intake Started on hypotonic fluid.  Sodium level subsequently improved.  141 on last check on 2/21. Recent Labs  Lab 10/22/22 0325  NA 141   Acute kidney injury Resolved. Recent Labs    09/22/22 0239 09/24/22 1159 09/27/22 0136 09/29/22 0303 09/30/22 0441 10/01/22 0248 10/09/22 0336 10/12/22 0306 10/15/22 0057 10/22/22 0325  BUN '20 19 22 '$ 25* '15 13 18 22 23 16  '$ CREATININE 0.53 0.64 0.59 0.58 0.50 0.49 0.47 0.48 0.51 0.56   Chronic iron deficiency anemia Hemoglobin low but stable.  Continue iron supplement. Recent Labs    09/29/22 0303 10/01/22 0248 10/09/22 0336 10/15/22 0057 10/22/22 0325  HGB 8.6* 8.2* 8.3* 8.4* 8.9*  MCV 75.6* 74.1* 73.4* 73.7* 74.1*   Permanent atrial fibrillation Continue flecainide, apixaban Metoprolol on hold due to soft blood pressure   Alzheimer's dementia Continue delirium precautions and supportive care. Melatonin for insomnia   Mobility: Encourage ambulation  Goals of care   Code Status: DNR     DVT prophylaxis:  SCDs Start:  08/25/22 0659 apixaban (ELIQUIS) tablet 5 mg   Antimicrobials: None Fluid: None Consultants: None Family Communication: None at bedside  Status is: Inpatient Level of care: Med-Surg   Dispo: Patient is from: SNF              Anticipated d/c is to: Medically stable for discharge.  Difficult to place.  Per Education officer, museum, patient can return to previous SNF [facility] due to unpaid bills.  She has no family to assist at home.  Medicaid application pending.    Scheduled Meds:  apixaban  5 mg Oral BID   feeding supplement  237 mL Oral TID BM   ferrous sulfate  325 mg Oral QODAY   flecainide  50 mg Oral BID   leptospermum manuka honey  1 Application Topical Daily   multivitamin with minerals  1 tablet Oral Daily   pantoprazole  40 mg Oral Daily   sodium hypochlorite   Irrigation Daily    PRN meds: acetaminophen **OR** acetaminophen, lip balm, melatonin, ondansetron **OR** ondansetron (ZOFRAN) IV   Infusions:    Diet:  Diet Order             DIET - DYS 1 Room service appropriate? Yes; Fluid consistency: Thin  Diet effective now                   Antimicrobials: Anti-infectives (From admission, onward)    Start     Dose/Rate Route Frequency Ordered Stop   09/03/22 1000  ertapenem [facility])  1,000 mg in sodium chloride 0.9 % 100 mL IVPB        1 g 200 mL/hr over 30 Minutes Intravenous Every 24 hours 09/02/22 1421 09/04/22 2048   08/30/22 1500  ertapenem (INVANZ) 1,000 mg in sodium chloride 0.9 % 100 mL IVPB  Status:  Discontinued        1 g 200 mL/hr over 30 Minutes Intravenous Every 24 hours 08/30/22 1419 09/02/22 1421   08/29/22 1115  cefTRIAXone (ROCEPHIN) 1 g in sodium chloride 0.9 % 100 mL IVPB  Status:  Discontinued        1 g 200 mL/hr over 30 Minutes Intravenous Every 24 hours 08/29/22 1017 08/30/22 1419       Skin assessment:  Pressure Injury 09/04/22 Sacrum Unstageable - Full thickness tissue loss in which the base of the injury is covered by slough  (yellow, tan, gray, green or brown) and/or eschar (tan, brown or black) in the wound bed. (Active)  09/04/22 1030  Location: Sacrum  Location Orientation:   Staging: Unstageable - Full thickness tissue loss in which the base of the injury is covered by slough (yellow, tan, gray, green or brown) and/or eschar (tan, brown or black) in the wound bed.  Wound Description (Comments):   Present on Admission: No     Pressure Injury 09/30/22 Hip Right Deep Tissue Pressure Injury - Purple or maroon localized area of discolored intact skin or blood-filled blister due to damage of underlying soft tissue from pressure and/or shear. 2 cms x 1 cm x 0.1 cms 80% red moist 20 (Active)  09/30/22 1200  Location: Hip  Location Orientation: Right  Staging: Deep Tissue Pressure Injury - Purple or maroon localized area of discolored intact skin or blood-filled blister due to damage of underlying soft tissue from pressure and/or shear.  Wound Description (Comments): 2 cms x 1 cm x 0.1 cms 80% red moist 20% yellow  Present on Admission: No     Pressure Injury 10/20/22 Labia Left Stage 2 -  Partial thickness loss of dermis presenting as a shallow open injury with a red, pink wound bed without slough. Abrasion on left labia (Active)  10/20/22 2017  Location: Labia  Location Orientation: Left  Staging: Stage 2 -  Partial thickness loss of dermis presenting as a shallow open injury with a red, pink wound bed without slough.  Wound Description (Comments): Abrasion on left labia  Present on Admission: No      Nutritional status:  Body mass index is 17.4 kg/m.  Nutrition Problem: Severe Malnutrition Etiology: chronic illness Signs/Symptoms: severe muscle depletion, severe fat depletion     Objective: Vitals:   10/24/22 0502 10/24/22 0733  BP: (!) 93/59 102/61  Pulse: 81 83  Resp: 16 16  Temp: (!) 97.5 F (36.4 C) 99 F (37.2 C)  SpO2: 100% 100%    Intake/Output Summary (Last 24 hours) at 10/24/2022  1359 Last data filed at 10/24/2022 [id number] Gross per 24 hour  Intake 360 ml  Output --  Net 360 ml   Filed Weights   10/12/22 0451 10/15/22 0500 10/20/22 0439  Weight: 63.2 kg 43.2 kg 43.1 kg   Weight change:  Body mass index is 17.4 kg/m.   Physical Exam: General exam: Elderly African-American female.  Not in physical distress Skin: No rashes, lesions or ulcers. HEENT: Atraumatic, normocephalic, no obvious bleeding Lungs: Clear to auscultation bilaterally CVS: Regular rhythm, tachycardic, no murmur GI/Abd soft, nontender, nondistended, bowel sound present CNS: Alert, awake, able to  answer yes/no questions. Psychiatry: Demented at baseline Extremities: No pedal edema, no calf tenderness  Data Review: I have personally reviewed the laboratory data and studies available.  F/u labs ordered Unresulted Labs (From admission, onward)    None       Total time spent in review of labs and imaging, patient evaluation, formulation of plan, documentation and communication with family: 25 minutes  Signed, Terrilee Croak, MD [facility] 10/24/2022

## 2022-10-25 NOTE — Progress Notes (Addendum)
PROGRESS NOTE  Donna Simmons  DOB: 1941-05-21  PCP: [name], DO [id number]  DOA: 08/25/2022  LOS: 23 days  Hospital Day: 62  Brief narrative: Donna Simmons is a 82 y.o. female with PMH significant for Alzheimer's dementia, HTN, HLD, permanent A-fib on Eliquis, GERD. 12/25, patient was sent to ED from [facility] for poor oral intake, increased lethargy, failure to thrive. Workup showed hypernatremia of 167, AKI.  Urine culture grew ESBL E. coli. Patient completed the course of IV ertapenem.  She could not return to SNF because of unpaid bills.  Family unable to take care of her at home.  Medicaid pending. Difficult to place.  Subjective: Patient was seen and examined this morning.   Lying down in bed.  On mittens.  Not in distress.  Assessment and plan: ESBL E. coli UTI Completed 5 days course of ertapenem   Hypernatremia Presented with sodium of 167 due to poor p.o. intake Started on hypotonic fluid.  Sodium level subsequently improved.  141 on last check on 2/21. Recent Labs  Lab 10/22/22 0325  NA 141   Acute kidney injury Resolved. Recent Labs    09/22/22 0239 09/24/22 1159 09/27/22 0136 09/29/22 0303 09/30/22 0441 10/01/22 0248 10/09/22 0336 10/12/22 0306 10/15/22 0057 10/22/22 0325  BUN '20 19 22 '$ 25* '15 13 18 22 23 16  '$ CREATININE 0.53 0.64 0.59 0.58 0.50 0.49 0.47 0.48 0.51 0.56   Chronic iron deficiency anemia Hemoglobin low but stable.  Continue iron supplement. Recent Labs    09/29/22 0303 10/01/22 0248 10/09/22 0336 10/15/22 0057 10/22/22 0325  HGB 8.6* 8.2* 8.3* 8.4* 8.9*  MCV 75.6* 74.1* 73.4* 73.7* 74.1*   Permanent atrial fibrillation Continue flecainide, apixaban Metoprolol on hold due to soft blood pressure   Alzheimer's dementia Continue delirium precautions and supportive care. Melatonin for insomnia   Mobility: Encourage ambulation  Goals of care   Code Status: DNR     DVT prophylaxis:  SCDs Start:  08/25/22 0659 apixaban (ELIQUIS) tablet 5 mg   Antimicrobials: None Fluid: None Consultants: None Family Communication: None at bedside  Status is: Inpatient Level of care: Med-Surg   Dispo: Patient is from: SNF              Anticipated d/c is to: Medically stable for discharge.  Difficult to place.  Per Education officer, museum, patient can return to previous SNF [facility] due to unpaid bills.  She has no family to assist at home.  Medicaid application pending.    Scheduled Meds:  apixaban  5 mg Oral BID   feeding supplement  237 mL Oral TID BM   ferrous sulfate  325 mg Oral QODAY   flecainide  50 mg Oral BID   leptospermum manuka honey  1 Application Topical Daily   multivitamin with minerals  1 tablet Oral Daily   pantoprazole  40 mg Oral Daily   sodium hypochlorite   Irrigation Daily    PRN meds: acetaminophen **OR** acetaminophen, lip balm, melatonin, ondansetron **OR** ondansetron (ZOFRAN) IV   Infusions:    Diet:  Diet Order             DIET - DYS 1 Room service appropriate? Yes; Fluid consistency: Thin  Diet effective now                   Antimicrobials: Anti-infectives (From admission, onward)    Start     Dose/Rate Route Frequency Ordered Stop   09/03/22 1000  ertapenem [facility])  1,000 mg in sodium chloride 0.9 % 100 mL IVPB        1 g 200 mL/hr over 30 Minutes Intravenous Every 24 hours 09/02/22 1421 09/04/22 2048   08/30/22 1500  ertapenem (INVANZ) 1,000 mg in sodium chloride 0.9 % 100 mL IVPB  Status:  Discontinued        1 g 200 mL/hr over 30 Minutes Intravenous Every 24 hours 08/30/22 1419 09/02/22 1421   08/29/22 1115  cefTRIAXone (ROCEPHIN) 1 g in sodium chloride 0.9 % 100 mL IVPB  Status:  Discontinued        1 g 200 mL/hr over 30 Minutes Intravenous Every 24 hours 08/29/22 1017 08/30/22 1419       Skin assessment:  Pressure Injury 09/04/22 Sacrum Unstageable - Full thickness tissue loss in which the base of the injury is covered by slough  (yellow, tan, gray, green or brown) and/or eschar (tan, brown or black) in the wound bed. (Active)  09/04/22 1030  Location: Sacrum  Location Orientation:   Staging: Unstageable - Full thickness tissue loss in which the base of the injury is covered by slough (yellow, tan, gray, green or brown) and/or eschar (tan, brown or black) in the wound bed.  Wound Description (Comments):   Present on Admission: No     Pressure Injury 09/30/22 Hip Right Deep Tissue Pressure Injury - Purple or maroon localized area of discolored intact skin or blood-filled blister due to damage of underlying soft tissue from pressure and/or shear. 2 cms x 1 cm x 0.1 cms 80% red moist 20 (Active)  09/30/22 1200  Location: Hip  Location Orientation: Right  Staging: Deep Tissue Pressure Injury - Purple or maroon localized area of discolored intact skin or blood-filled blister due to damage of underlying soft tissue from pressure and/or shear.  Wound Description (Comments): 2 cms x 1 cm x 0.1 cms 80% red moist 20% yellow  Present on Admission: No     Pressure Injury 10/20/22 Labia Left Stage 2 -  Partial thickness loss of dermis presenting as a shallow open injury with a red, pink wound bed without slough. Abrasion on left labia (Active)  10/20/22 2017  Location: Labia  Location Orientation: Left  Staging: Stage 2 -  Partial thickness loss of dermis presenting as a shallow open injury with a red, pink wound bed without slough.  Wound Description (Comments): Abrasion on left labia  Present on Admission: No      Nutritional status:  Body mass index is 17.4 kg/m.  Nutrition Problem: Severe Malnutrition Etiology: chronic illness Signs/Symptoms: severe muscle depletion, severe fat depletion     Objective: Vitals:   10/25/22 0346 10/25/22 0724  BP: 109/68 98/65  Pulse: (!) 112 83  Resp: 16 16  Temp: 97.9 F (36.6 C) 98.8 F (37.1 C)  SpO2: 95% 100%    Intake/Output Summary (Last 24 hours) at 10/25/2022  1424 Last data filed at 10/25/2022 0800 Gross per 24 hour  Intake 300 ml  Output --  Net 300 ml   Filed Weights   10/12/22 0451 10/15/22 0500 10/20/22 0439  Weight: 63.2 kg 43.2 kg 43.1 kg   Weight change:  Body mass index is 17.4 kg/m.   Physical Exam: General exam: Elderly African-American female.  Not in physical distress Skin: No rashes, lesions or ulcers. HEENT: Atraumatic, normocephalic, no obvious bleeding Lungs: Clear to auscultation bilaterally CVS: Regular rhythm, tachycardic, no murmur GI/Abd soft, nontender, nondistended, bowel sound present CNS: Alert, awake, able to answer  yes/no questions. Psychiatry: Demented at baseline Extremities: No pedal edema, no calf tenderness  Data Review: I have personally reviewed the laboratory data and studies available.  F/u labs ordered Unresulted Labs (From admission, onward)     Start     Ordered   Pending  MRSA Next Gen by PCR, Nasal  Once,   R        Pending            Total time spent in review of labs and imaging, patient evaluation, formulation of plan, documentation and communication with family: 25 minutes  Signed, Terrilee Croak, MD [facility] 10/25/2022

## 2022-10-26 DIAGNOSIS — E870 Hyperosmolality and hypernatremia: Secondary | ICD-10-CM | POA: Diagnosis not present

## 2022-10-26 NOTE — Progress Notes (Signed)
PROGRESS NOTE  Donna Simmons  DOB: [birth year]  PCP: [name], DO [id number]  DOA: 08/25/2022  LOS: 51 days  Hospital Day: 99  Brief narrative: Donna Simmons is a 82 y.o. female with PMH significant for Alzheimer's dementia, HTN, HLD, permanent A-fib on Eliquis, GERD. 12/25, patient was sent to ED from [facility] for poor oral intake, increased lethargy, failure to thrive. Workup showed hypernatremia of 167, AKI.  Urine culture grew ESBL E. coli. Patient completed the course of IV ertapenem.  She could not return to SNF because of unpaid bills.  Family unable to take care of her at home.  Medicaid pending. Difficult to place.  Subjective: Patient was seen and examined this morning.   Lying down in bed.  On mittens.  Not in distress. Husband at bedside trying to feed her.  States that he is in touch with case worker on a regular basis.  His understanding is that 'they are trying to find a bed for her to go.'  Assessment and plan: ESBL E. coli UTI Completed 5 days course of ertapenem   Hypernatremia Presented with sodium of 167 due to poor p.o. intake Started on hypotonic fluid.  Sodium level subsequently improved.  141 on last check on 2/21. Recent Labs  Lab 10/22/22 0325  NA 141    Acute kidney injury Resolved. Recent Labs    09/22/22 0239 09/24/22 1159 09/27/22 0136 09/29/22 0303 09/30/22 0441 10/01/22 0248 10/09/22 0336 10/12/22 0306 10/15/22 0057 10/22/22 0325  BUN '20 19 22 '$ 25* '15 13 18 22 23 16  '$ CREATININE 0.53 0.64 0.59 0.58 0.50 0.49 0.47 0.48 0.51 0.56    Chronic iron deficiency anemia Hemoglobin low but stable.  Continue iron supplement. Recent Labs    09/29/22 0303 10/01/22 0248 10/09/22 0336 10/15/22 0057 10/22/22 0325  HGB 8.6* 8.2* 8.3* 8.4* 8.9*  MCV 75.6* 74.1* 73.4* 73.7* 74.1*    Permanent atrial fibrillation Continue flecainide, apixaban Metoprolol on hold due to soft blood pressure   Alzheimer's  dementia Continue delirium precautions and supportive care. Melatonin for insomnia   Mobility: Encourage ambulation  Goals of care   Code Status: DNR     DVT prophylaxis:  SCDs Start: 08/25/22 0659 apixaban (ELIQUIS) tablet 5 mg   Antimicrobials: None Fluid: None Consultants: None Family Communication: None at bedside  Status is: Inpatient Level of care: Med-Surg   Dispo: Patient is from: SNF              Anticipated d/c is to: Medically stable for discharge.  Difficult to place.  Per Education officer, museum, patient can return to previous SNF [facility] due to unpaid bills.  She has no family to assist at home.  Medicaid application pending.    Scheduled Meds:  apixaban  5 mg Oral BID   feeding supplement  237 mL Oral TID BM   ferrous sulfate  325 mg Oral QODAY   flecainide  50 mg Oral BID   leptospermum manuka honey  1 Application Topical Daily   multivitamin with minerals  1 tablet Oral Daily   pantoprazole  40 mg Oral Daily   sodium hypochlorite   Irrigation Daily    PRN meds: acetaminophen **OR** acetaminophen, lip balm, melatonin, ondansetron **OR** ondansetron (ZOFRAN) IV   Infusions:    Diet:  Diet Order             DIET - DYS 1 Room service appropriate? Yes; Fluid consistency: Thin  Diet effective now  Antimicrobials: Anti-infectives (From admission, onward)    Start     Dose/Rate Route Frequency Ordered Stop   09/03/22 1000  ertapenem (INVANZ) 1,000 mg in sodium chloride 0.9 % 100 mL IVPB        1 g 200 mL/hr over 30 Minutes Intravenous Every 24 hours 09/02/22 1421 09/04/22 2048   08/30/22 1500  ertapenem (INVANZ) 1,000 mg in sodium chloride 0.9 % 100 mL IVPB  Status:  Discontinued        1 g 200 mL/hr over 30 Minutes Intravenous Every 24 hours 08/30/22 1419 09/02/22 1421   08/29/22 1115  cefTRIAXone (ROCEPHIN) 1 g in sodium chloride 0.9 % 100 mL IVPB  Status:  Discontinued        1 g 200 mL/hr over 30 Minutes Intravenous Every 24  hours 08/29/22 1017 08/30/22 1419       Skin assessment:  Pressure Injury 09/04/22 Sacrum Unstageable - Full thickness tissue loss in which the base of the injury is covered by slough (yellow, tan, gray, green or brown) and/or eschar (tan, brown or black) in the wound bed. (Active)  09/04/22 1030  Location: Sacrum  Location Orientation:   Staging: Unstageable - Full thickness tissue loss in which the base of the injury is covered by slough (yellow, tan, gray, green or brown) and/or eschar (tan, brown or black) in the wound bed.  Wound Description (Comments):   Present on Admission: No     Pressure Injury 09/30/22 Hip Right Deep Tissue Pressure Injury - Purple or maroon localized area of discolored intact skin or blood-filled blister due to damage of underlying soft tissue from pressure and/or shear. 2 cms x 1 cm x 0.1 cms 80% red moist 20 (Active)  09/30/22 1200  Location: Hip  Location Orientation: Right  Staging: Deep Tissue Pressure Injury - Purple or maroon localized area of discolored intact skin or blood-filled blister due to damage of underlying soft tissue from pressure and/or shear.  Wound Description (Comments): 2 cms x 1 cm x 0.1 cms 80% red moist 20% yellow  Present on Admission: No     Pressure Injury 10/20/22 Labia Left Stage 2 -  Partial thickness loss of dermis presenting as a shallow open injury with a red, pink wound bed without slough. Abrasion on left labia (Active)  10/20/22 2017  Location: Labia  Location Orientation: Left  Staging: Stage 2 -  Partial thickness loss of dermis presenting as a shallow open injury with a red, pink wound bed without slough.  Wound Description (Comments): Abrasion on left labia  Present on Admission: No      Nutritional status:  Body mass index is 17.4 kg/m.  Nutrition Problem: Severe Malnutrition Etiology: chronic illness Signs/Symptoms: severe muscle depletion, severe fat depletion     Objective: Vitals:   10/26/22 0317  10/26/22 0802  BP: 107/74 104/74  Pulse: (!) 101 88  Resp:  14  Temp: 97.9 F (36.6 C) 98.2 F (36.8 C)  SpO2: 100% 100%    Intake/Output Summary (Last 24 hours) at 10/26/2022 1114 Last data filed at 10/25/2022 2030 Gross per 24 hour  Intake 120 ml  Output --  Net 120 ml    Filed Weights   10/12/22 0451 10/15/22 0500 10/20/22 0439  Weight: 63.2 kg 43.2 kg 43.1 kg   Weight change:  Body mass index is 17.4 kg/m.   Physical Exam: General exam: Elderly African-American female.  Not in physical distress Skin: No rashes, lesions or ulcers. HEENT: Atraumatic, normocephalic, no  obvious bleeding Lungs: Clear to auscultation bilaterally CVS: Regular rhythm, tachycardic, no murmur GI/Abd soft, nontender, nondistended, bowel sound present CNS: Alert, awake, able to answer yes/no questions. Psychiatry: Demented at baseline Extremities: No pedal edema, no calf tenderness  Data Review: I have personally reviewed the laboratory data and studies available.  F/u labs ordered Unresulted Labs (From admission, onward)     Start     Ordered   Pending  MRSA Next Gen by PCR, Nasal  Once,   R        Pending            Total time spent in review of labs and imaging, patient evaluation, formulation of plan, documentation and communication with family: 25 minutes  Signed, Terrilee Croak, MD [facility] 10/26/2022

## 2022-10-27 DIAGNOSIS — E870 Hyperosmolality and hypernatremia: Secondary | ICD-10-CM | POA: Diagnosis not present

## 2022-10-27 NOTE — Progress Notes (Signed)
PROGRESS NOTE  Donna Simmons  DOB: 12-19-1940  PCP: [name], DO [id number]  DOA: 08/25/2022  LOS: 15 days  Hospital Day: 11  Brief narrative: Donna Simmons is a 82 y.o. female with PMH significant for Alzheimer's dementia, HTN, HLD, permanent A-fib on Eliquis, GERD. 12/25, patient was sent to ED from [facility] for poor oral intake, increased lethargy, failure to thrive. Workup showed hypernatremia of 167, AKI.  Urine culture grew ESBL E. coli. Patient completed the course of IV ertapenem.  She could not return to SNF because of unpaid bills.  Family unable to take care of her at home.  Medicaid pending. Difficult to place.  Subjective: Patient was seen and examined this morning.   Lying down in bed.  On mittens.  Not in distress. Family not at bedside  Assessment and plan: ESBL E. coli UTI Completed 5 days course of ertapenem   Hypernatremia Presented with sodium of 167 due to poor p.o. intake Started on hypotonic fluid.  Sodium level subsequently improved.  141 on last check on 2/21. Recent Labs  Lab 10/22/22 0325  NA 141    Acute kidney injury Resolved. Recent Labs    09/22/22 0239 09/24/22 1159 09/27/22 0136 09/29/22 0303 09/30/22 0441 10/01/22 0248 10/09/22 0336 10/12/22 0306 10/15/22 0057 10/22/22 0325  BUN '20 19 22 '$ 25* '15 13 18 22 23 16  '$ CREATININE 0.53 0.64 0.59 0.58 0.50 0.49 0.47 0.48 0.51 0.56    Chronic iron deficiency anemia Hemoglobin low but stable.  Continue iron supplement. Recent Labs    09/29/22 0303 10/01/22 0248 10/09/22 0336 10/15/22 0057 10/22/22 0325  HGB 8.6* 8.2* 8.3* 8.4* 8.9*  MCV 75.6* 74.1* 73.4* 73.7* 74.1*    Permanent atrial fibrillation Continue flecainide, apixaban Metoprolol on hold due to soft blood pressure   Alzheimer's dementia Continue delirium precautions and supportive care. Melatonin for insomnia   Mobility: Encourage ambulation  Goals of care   Code Status: DNR     DVT  prophylaxis:  SCDs Start: 08/25/22 0659 apixaban (ELIQUIS) tablet 5 mg   Antimicrobials: None Fluid: None Consultants: None Family Communication: None at bedside  Status is: Inpatient Level of care: Med-Surg   Dispo: Patient is from: SNF              Anticipated d/c is to: Medically stable for discharge.  Difficult to place.  Per Education officer, museum, patient can return to previous SNF [facility] due to unpaid bills.  She has no family to assist at home.  Medicaid application pending.    Scheduled Meds:  apixaban  5 mg Oral BID   feeding supplement  237 mL Oral TID BM   ferrous sulfate  325 mg Oral QODAY   flecainide  50 mg Oral BID   leptospermum manuka honey  1 Application Topical Daily   multivitamin with minerals  1 tablet Oral Daily   pantoprazole  40 mg Oral Daily   sodium hypochlorite   Irrigation Daily    PRN meds: acetaminophen **OR** acetaminophen, lip balm, melatonin, ondansetron **OR** ondansetron (ZOFRAN) IV   Infusions:    Diet:  Diet Order             DIET - DYS 1 Room service appropriate? Yes; Fluid consistency: Thin  Diet effective now                   Antimicrobials: Anti-infectives (From admission, onward)    Start     Dose/Rate Route Frequency Ordered Stop  09/03/22 1000  ertapenem (INVANZ) 1,000 mg in sodium chloride 0.9 % 100 mL IVPB        1 g 200 mL/hr over 30 Minutes Intravenous Every 24 hours 09/02/22 1421 09/04/22 2048   08/30/22 1500  ertapenem (INVANZ) 1,000 mg in sodium chloride 0.9 % 100 mL IVPB  Status:  Discontinued        1 g 200 mL/hr over 30 Minutes Intravenous Every 24 hours 08/30/22 1419 09/02/22 1421   08/29/22 1115  cefTRIAXone (ROCEPHIN) 1 g in sodium chloride 0.9 % 100 mL IVPB  Status:  Discontinued        1 g 200 mL/hr over 30 Minutes Intravenous Every 24 hours 08/29/22 1017 08/30/22 1419       Skin assessment:  Pressure Injury 09/04/22 Sacrum Unstageable - Full thickness tissue loss in which the base of the injury  is covered by slough (yellow, tan, gray, green or brown) and/or eschar (tan, brown or black) in the wound bed. (Active)  09/04/22 1030  Location: Sacrum  Location Orientation:   Staging: Unstageable - Full thickness tissue loss in which the base of the injury is covered by slough (yellow, tan, gray, green or brown) and/or eschar (tan, brown or black) in the wound bed.  Wound Description (Comments):   Present on Admission: No     Pressure Injury 09/30/22 Hip Right Deep Tissue Pressure Injury - Purple or maroon localized area of discolored intact skin or blood-filled blister due to damage of underlying soft tissue from pressure and/or shear. 2 cms x 1 cm x 0.1 cms 80% red moist 20 (Active)  09/30/22 1200  Location: Hip  Location Orientation: Right  Staging: Deep Tissue Pressure Injury - Purple or maroon localized area of discolored intact skin or blood-filled blister due to damage of underlying soft tissue from pressure and/or shear.  Wound Description (Comments): 2 cms x 1 cm x 0.1 cms 80% red moist 20% yellow  Present on Admission: No     Pressure Injury 10/20/22 Labia Left Stage 2 -  Partial thickness loss of dermis presenting as a shallow open injury with a red, pink wound bed without slough. Abrasion on left labia (Active)  10/20/22 2017  Location: Labia  Location Orientation: Left  Staging: Stage 2 -  Partial thickness loss of dermis presenting as a shallow open injury with a red, pink wound bed without slough.  Wound Description (Comments): Abrasion on left labia  Present on Admission: No      Nutritional status:  Body mass index is 17.4 kg/m.  Nutrition Problem: Severe Malnutrition Etiology: chronic illness Signs/Symptoms: severe muscle depletion, severe fat depletion     Objective: Vitals:   10/27/22 0421 10/27/22 0757  BP: 116/73 97/78  Pulse: 84 73  Resp: 16 16  Temp: 97.8 F (36.6 C) (!) 97.4 F (36.3 C)  SpO2: 97% 100%    Intake/Output Summary (Last 24  hours) at 10/27/2022 1410 Last data filed at 10/26/2022 2130 Gross per 24 hour  Intake 120 ml  Output --  Net 120 ml    Filed Weights   10/12/22 0451 10/15/22 0500 10/20/22 0439  Weight: 63.2 kg 43.2 kg 43.1 kg   Weight change:  Body mass index is 17.4 kg/m.   Physical Exam: General exam: Elderly African-American female.  Not in physical distress Skin: No rashes, lesions or ulcers. HEENT: Atraumatic, normocephalic, no obvious bleeding Lungs: Clear to auscultation bilaterally CVS: Regular rhythm, tachycardic, no murmur GI/Abd soft, nontender, nondistended, bowel sound present  CNS: Alert, awake, able to answer yes/no questions. Psychiatry: Demented at baseline Extremities: No pedal edema, no calf tenderness  Data Review: I have personally reviewed the laboratory data and studies available.  F/u labs ordered Unresulted Labs (From admission, onward)     Start     Ordered   10/28/22 [id number]  Basic metabolic panel  Tomorrow morning,   R       Question:  Specimen collection method  Answer:  Lab=Lab collect   10/27/22 1122            Total time spent in review of labs and imaging, patient evaluation, formulation of plan, documentation and communication with family: 25 minutes  Signed, Terrilee Croak, MD [facility] 10/27/2022

## 2022-10-27 NOTE — TOC Progression Note (Signed)
Transition of Care [facility]) - Progression Note    Patient Details  Name: Donna Simmons MRN: [id number] Date of Birth: 05-Apr-1941  Transition of Care [facility]) CM/SW [place], [place] Phone Number: 10/27/2022, 2:25 PM  Clinical Narrative:    CSW followed up again with [facility], no answer yet on admission decision. No other facilities have been identified to accept. TOC will continue to follow for DC needs.         Expected Discharge Plan and Services                                               Social Determinants of Health (SDOH) Interventions SDOH Screenings   Food Insecurity: Unknown (10/19/2022)  Alcohol Screen: Low Risk  (04/12/2019)  Depression (PHQ2-9): Low Risk  (11/05/2020)  Tobacco Use: Low Risk  (10/19/2022)    Readmission Risk Interventions     No data to display

## 2022-10-27 NOTE — Consult Note (Signed)
[place] Nurse wound follow up Wound type:pressure  Measurement:  Sacrum Stage 3 previously Unstageable 4 cms x 2 cms x 1.5 cms with 2 cms undermining from 6 o'clock to 12 o'clock, has lip of loosely adherent slough, wound bed 20% yellow slough 80% red  R medial foot (base of great toe) Stage 4 Pressure Injury 2 cms x 3 cms x 0.1 cms 100% pink moist, bone noted  R great toe Deep Tissue Pressure Injury 2 cms x 2 cms, skin intact  L lateral foot Deep Tissue Injury that has evolved to Stage 2 1 cm x 1 cm x 0.1 cm 100% pink moist  R hip Deep Tissue Injury that has evolved to Unstageable 3 cms x 2 cms 80% black devitalized tissue 20% yellow necrotic tissue  Deep Tissue Injury noted superior to R hip wound that has now evolved to Unstageable 0.2 cms x 0.3 cms 100% yellow slough   Drainage (amount, consistency, odor) minimal serosanguinous, sacral wound no longer malodorous minimal serosanguinous drainage  Periwound: some scarring noted around sacral wound, others intact  Dressing procedure/placement/frequency:  Sacrum change to Normal Saline wet to dry gauze twice daily, fluff gauze to fill entire wound bed, cover with dry gauze and ABD pad or foam dressing whichever is preferred.  R medial foot clean with NS, cut piece of Silver Hydrofiber Kellie Simmering [phone]) to fit wound bed daily, cover with foam dressing.  R great toe DTI cover with foam dressing, lift daily to assess wound.  L lateral foot clean with NS, cut piece of Silver Hydrofiber Kellie Simmering [phone]) to fit wound bed daily, cover with foam dressing.  R hip Deep Tissue Injury continue with hydrotherapy per PT and Medihoney as previously ordered.   Patient remains on low air loss mattress for moisture management and pressure redistribution.  Prevalon boots in place at todays visit to offload pressure points (patient is contracted).    WOC will follow this patient weekly.   Thank you,    Jacqualin Shirkey MSN, RN-BC, [employer]

## 2022-10-28 LAB — BASIC METABOLIC PANEL
Anion gap: 16 — ABNORMAL HIGH (ref 5–15)
BUN: 17 mg/dL (ref 8–23)
CO2: 27 mmol/L (ref 22–32)
Calcium: 9.2 mg/dL (ref 8.9–10.3)
Chloride: 102 mmol/L (ref 98–111)
Creatinine, Ser: 0.49 mg/dL (ref 0.44–1.00)
GFR, Estimated: >60 mL/min (ref >60)
Glucose, Bld: 98 mg/dL (ref 70–99)
Potassium: 3.2 mmol/L — ABNORMAL LOW (ref 3.5–5.1)
Sodium: 145 mmol/L (ref 135–145)

## 2022-10-28 MED ORDER — POTASSIUM CHLORIDE CRYS ER 20 MEQ PO TBCR
40.0000 meq | EXTENDED_RELEASE_TABLET | Freq: Once | ORAL | Status: AC
  Administered 2022-10-28: 40 meq via ORAL
  Filled 2022-10-28: qty 2

## 2022-10-28 MED ORDER — POTASSIUM CHLORIDE 20 MEQ PO PACK
40.0000 meq | PACK | Freq: Once | ORAL | Status: AC
  Administered 2022-10-28: 40 meq via ORAL
  Filled 2022-10-28: qty 2

## 2022-10-28 NOTE — Progress Notes (Signed)
Physical Therapy Wound Treatment Patient Details  Name: Donna Simmons MRN: [id number] Date of Birth: 03/24/1941  Today's Date: 10/28/2022 Time: [id number] Time Calculation (min): 25 min  Subjective  Subjective Assessment Subjective: Pt nonverbal throughout, slept through most of session Patient and Family Stated Goals: placement in [facility] Prior Treatments: dressing changes  Pain Score:  Premedicated; did not appear to be in pain throughout session.   Wound Assessment  Pressure Injury 09/30/22 Hip Right Deep Tissue Pressure Injury - Purple or maroon localized area of discolored intact skin or blood-filled blister due to damage of underlying soft tissue from pressure and/or shear. 2 cms x 1 cm x 0.1 cms 80% red moist 20 (Active)  Wound Image   10/28/22 1433  Dressing Type Foam - Lift dressing to assess site every shift;Gauze (Comment);Honey;Barrier Film (skin prep) 10/28/22 1433  Dressing Clean, Dry, Intact;Changed 10/28/22 1433  Dressing Change Frequency Daily 10/28/22 1433  State of Healing Eschar 10/28/22 1433  Site / Wound Assessment Black;Pink;Yellow 10/28/22 1433  % Wound base Red or Granulating 5% 10/28/22 1433  % Wound base Yellow/Fibrinous Exudate 5% 10/28/22 1433  % Wound base Black/Eschar 90% 10/28/22 1433  % Wound base Other/Granulation Tissue (Comment) 0% 10/28/22 1433  Peri-wound Assessment Pink;Erythema (blanchable);Denuded 10/28/22 1433  Wound Length (cm) 3.8 cm 10/28/22 1433  Wound Width (cm) 1.9 cm 10/28/22 1433  Wound Depth (cm) 0.2 cm 10/28/22 1433  Wound Surface Area (cm^2) 7.22 cm^2 10/28/22 1433  Wound Volume (cm^3) 1.44 cm^3 10/28/22 1433  Tunneling (cm) 0 10/28/22 1433  Undermining (cm) 0 10/28/22 1433  Margins Unattached edges (unapproximated) 10/28/22 1433  Drainage Amount Minimal 10/28/22 1433  Drainage Description Serosanguineous 10/28/22 1433  Treatment Debridement (Selective);Irrigation;Packing (Dry gauze) 10/28/22 1433      Selective Debridement  (non-excisional) Selective Debridement (non-excisional) - Location: R hip Selective Debridement (non-excisional) - Tools Used: Forceps, Scalpel Selective Debridement (non-excisional) - Tissue Removed: eschar    Wound Assessment and Plan  Wound Therapy - Assess/Plan/Recommendations Wound Therapy - Clinical Statement: Progressing with debridement of eschar. Pt tolerated well overall without complaints of pain. This patient will benefit from continued wound therapy for selective removal of unviable tissue, to decrease bioburden, and promote wound bed healing. Wound Therapy - Functional Problem List: pt unable to turn herself in the bed and has due to cognitive decline is not able to understand need for offloading Factors Delaying/Impairing Wound Healing: Immobility, Incontinence, Multiple medical problems Hydrotherapy Plan: Debridement, Dressing change, Patient/family education Wound Therapy - Frequency:  (2x / week) Wound Therapy - Follow Up Recommendations: dressing changes by RN  Wound Therapy Goals- Improve the function of patient's integumentary system by progressing the wound(s) through the phases of wound healing (inflammation - proliferation - remodeling) by: Wound Therapy Goals - Improve the function of patient's integumentary system by progressing the wound(s) through the phases of wound healing by: Decrease Necrotic Tissue to: 20% Decrease Necrotic Tissue - Progress: Progressing toward goal Increase Granulation Tissue to: 80% Increase Granulation Tissue - Progress: Progressing toward goal Goals/treatment plan/discharge plan were made with and agreed upon by patient/family: No, Patient unable to participate in goals/treatment/discharge plan and family unavailable Time For Goal Achievement: 7 days Wound Therapy - Potential for Goals: Good  Goals will be updated until maximal potential achieved or discharge criteria met.  Discharge criteria: when goals achieved, discharge from  hospital, MD decision/surgical intervention, no progress towards goals, refusal/missing three consecutive treatments without notification or medical reason.  GP     Charges PT Wound  Care Charges $Wound Debridement up to 20 cm: < or equal to 20 cm $PT Hydrotherapy Dressing: 1 dressing $PT Hydrotherapy Visit: 1 Visit       Thelma Comp 10/28/2022, 2:38 PM  Rolinda Roan, PT, DPT Acute Rehabilitation Services Secure Chat Preferred Office: [phone]

## 2022-10-28 NOTE — Progress Notes (Signed)
PROGRESS NOTE  Donna Simmons  DOB: [birth year]  PCP: [name], DO [id number]  DOA: 08/25/2022  LOS: 22 days  Hospital Day: 65  Brief narrative: Donna Simmons is a 82 y.o. female with PMH significant for Alzheimer's dementia, HTN, HLD, permanent A-fib on Eliquis, GERD. 12/25, patient was sent to ED from [facility] for poor oral intake, increased lethargy, failure to thrive. Workup showed hypernatremia of 167, AKI.  Urine culture grew ESBL E. coli. Patient completed the course of IV ertapenem.  She could not return to SNF because of unpaid bills.  Family unable to take care of her at home.  Medicaid pending. Difficult to place.  Subjective: Patient was seen and examined this morning.   Lying down in bed.  On mittens.  Not in distress. Family not at bedside  Assessment and plan: ESBL E. coli UTI Completed 5 days course of ertapenem   Hypernatremia Presented with sodium of 167 due to poor p.o. intake Started on hypotonic fluid.  Sodium level subsequently improved.  141 on last check on 2/21. Recent Labs  Lab 10/22/22 0325 10/28/22 0352  NA 141 145    Acute kidney injury Resolved. Recent Labs    09/24/22 1159 09/27/22 0136 09/29/22 0303 09/30/22 0441 10/01/22 0248 10/09/22 0336 10/12/22 0306 10/15/22 0057 10/22/22 0325 10/28/22 0352  BUN 19 22 25* '15 13 18 22 23 16 17  '$ CREATININE 0.64 0.59 0.58 0.50 0.49 0.47 0.48 0.51 0.56 0.49    Chronic iron deficiency anemia Hemoglobin low but stable.  Continue iron supplement. Recent Labs    09/29/22 0303 10/01/22 0248 10/09/22 0336 10/15/22 0057 10/22/22 0325  HGB 8.6* 8.2* 8.3* 8.4* 8.9*  MCV 75.6* 74.1* 73.4* 73.7* 74.1*    Permanent atrial fibrillation Continue flecainide, apixaban Metoprolol on hold due to soft blood pressure   Alzheimer's dementia Continue delirium precautions and supportive care. Melatonin for insomnia   Mobility: Encourage ambulation  Goals of care   Code Status:  DNR     DVT prophylaxis:  SCDs Start: 08/25/22 0659 apixaban (ELIQUIS) tablet 5 mg   Antimicrobials: None Fluid: None Consultants: None Family Communication: None at bedside  Status is: Inpatient Level of care: Med-Surg   Dispo: Patient is from: SNF              Anticipated d/c is to: Medically stable for discharge.  Difficult to place.  Per Education officer, museum, patient can return to previous SNF [facility] due to unpaid bills.  She has no family to assist at home.  Medicaid application pending.    Scheduled Meds:  apixaban  5 mg Oral BID   feeding supplement  237 mL Oral TID BM   ferrous sulfate  325 mg Oral QODAY   flecainide  50 mg Oral BID   leptospermum manuka honey  1 Application Topical Daily   multivitamin with minerals  1 tablet Oral Daily   pantoprazole  40 mg Oral Daily    PRN meds: acetaminophen **OR** acetaminophen, lip balm, melatonin, ondansetron **OR** ondansetron (ZOFRAN) IV   Infusions:    Diet:  Diet Order             DIET - DYS 1 Room service appropriate? Yes; Fluid consistency: Thin  Diet effective now                   Antimicrobials: Anti-infectives (From admission, onward)    Start     Dose/Rate Route Frequency Ordered Stop   09/03/22 1000  ertapenem (INVANZ) 1,000 mg in sodium chloride 0.9 % 100 mL IVPB        1 g 200 mL/hr over 30 Minutes Intravenous Every 24 hours 09/02/22 1421 09/04/22 2048   08/30/22 1500  ertapenem (INVANZ) 1,000 mg in sodium chloride 0.9 % 100 mL IVPB  Status:  Discontinued        1 g 200 mL/hr over 30 Minutes Intravenous Every 24 hours 08/30/22 1419 09/02/22 1421   08/29/22 1115  cefTRIAXone (ROCEPHIN) 1 g in sodium chloride 0.9 % 100 mL IVPB  Status:  Discontinued        1 g 200 mL/hr over 30 Minutes Intravenous Every 24 hours 08/29/22 1017 08/30/22 1419       Skin assessment:  Pressure Injury 09/04/22 Sacrum Unstageable - Full thickness tissue loss in which the base of the injury is covered by slough  (yellow, tan, gray, green or brown) and/or eschar (tan, brown or black) in the wound bed. (Active)  09/04/22 1030  Location: Sacrum  Location Orientation:   Staging: Unstageable - Full thickness tissue loss in which the base of the injury is covered by slough (yellow, tan, gray, green or brown) and/or eschar (tan, brown or black) in the wound bed.  Wound Description (Comments):   Present on Admission: No     Pressure Injury 09/30/22 Hip Right Deep Tissue Pressure Injury - Purple or maroon localized area of discolored intact skin or blood-filled blister due to damage of underlying soft tissue from pressure and/or shear. 2 cms x 1 cm x 0.1 cms 80% red moist 20 (Active)  09/30/22 1200  Location: Hip  Location Orientation: Right  Staging: Deep Tissue Pressure Injury - Purple or maroon localized area of discolored intact skin or blood-filled blister due to damage of underlying soft tissue from pressure and/or shear.  Wound Description (Comments): 2 cms x 1 cm x 0.1 cms 80% red moist 20% yellow  Present on Admission: No     Pressure Injury 10/20/22 Labia Left Stage 2 -  Partial thickness loss of dermis presenting as a shallow open injury with a red, pink wound bed without slough. Abrasion on left labia (Active)  10/20/22 2017  Location: Labia  Location Orientation: Left  Staging: Stage 2 -  Partial thickness loss of dermis presenting as a shallow open injury with a red, pink wound bed without slough.  Wound Description (Comments): Abrasion on left labia  Present on Admission: No      Nutritional status:  Body mass index is 17.4 kg/m.  Nutrition Problem: Severe Malnutrition Etiology: chronic illness Signs/Symptoms: severe muscle depletion, severe fat depletion     Objective: Vitals:   10/27/22 2225 10/28/22 0959  BP: 98/79 (!) 91/55  Pulse: 97 61  Resp: 16 16  Temp: 98.7 F (37.1 C) (!) 97.5 F (36.4 C)  SpO2: 100% 100%    Intake/Output Summary (Last 24 hours) at 10/28/2022  1432 Last data filed at 10/28/2022 1040 Gross per 24 hour  Intake 320 ml  Output --  Net 320 ml    Filed Weights   10/12/22 0451 10/15/22 0500 10/20/22 0439  Weight: 63.2 kg 43.2 kg 43.1 kg   Weight change:  Body mass index is 17.4 kg/m.   Physical Exam: General exam: Elderly African-American female.  Not in physical distress Skin: No rashes, lesions or ulcers. HEENT: Atraumatic, normocephalic, no obvious bleeding Lungs: Clear to auscultation bilaterally CVS: Regular rhythm, tachycardic, no murmur GI/Abd soft, nontender, nondistended, bowel sound present CNS: Alert,  awake, able to answer yes/no questions. Psychiatry: Demented at baseline Extremities: No pedal edema, no calf tenderness  Data Review: I have personally reviewed the laboratory data and studies available.  F/u labs ordered Unresulted Labs (From admission, onward)     Start     Ordered   10/29/22 [id number]  Basic metabolic panel  Tomorrow morning,   R       Question:  Specimen collection method  Answer:  Lab=Lab collect   10/28/22 0953            Total time spent in review of labs and imaging, patient evaluation, formulation of plan, documentation and communication with family: 25 minutes  Signed, Terrilee Croak, MD [facility] 10/28/2022

## 2022-10-28 NOTE — Plan of Care (Signed)
  Problem: Clinical Measurements: Goal: Diagnostic test results will improve Outcome: Progressing   Problem: Elimination: Goal: Will not experience complications related to bowel motility Outcome: Progressing Goal: Will not experience complications related to urinary retention Outcome: Progressing

## 2022-10-29 LAB — BASIC METABOLIC PANEL
Anion gap: 9 (ref 5–15)
BUN: 15 mg/dL (ref 8–23)
CO2: 25 mmol/L (ref 22–32)
Calcium: 8.8 mg/dL — ABNORMAL LOW (ref 8.9–10.3)
Chloride: 108 mmol/L (ref 98–111)
Creatinine, Ser: 0.53 mg/dL (ref 0.44–1.00)
GFR, Estimated: >60 mL/min (ref >60)
Glucose, Bld: 90 mg/dL (ref 70–99)
Potassium: 4 mmol/L (ref 3.5–5.1)
Sodium: 142 mmol/L (ref 135–145)

## 2022-10-29 NOTE — TOC Progression Note (Signed)
Transition of Care [facility]) - Progression Note    Patient Details  Name: Donna Simmons MRN: [id number] Date of Birth: 01/06/1941  Transition of Care [facility]) CM/SW [place], [place] Phone Number: 10/29/2022, 2:09 PM  Clinical Narrative:    CSW followed up with [facility], VM left. CSW spoke with [facility] and they advised they are still full. CSW left VM for admissions to discuss case. TOC will continue to follow for DC needs.         Expected Discharge Plan and Services                                               Social Determinants of Health (SDOH) Interventions SDOH Screenings   Food Insecurity: Unknown (10/19/2022)  Alcohol Screen: Low Risk  (04/12/2019)  Depression (PHQ2-9): Low Risk  (11/05/2020)  Tobacco Use: Low Risk  (10/19/2022)    Readmission Risk Interventions     No data to display

## 2022-10-29 NOTE — Progress Notes (Signed)
PROGRESS NOTE  Donna Simmons  DOB: 11/01/1940  PCP: [name], DO [id number]  DOA: 08/25/2022  LOS: 26 days  Hospital Day: 79  Brief narrative: Donna Simmons is a 82 y.o. female with PMH significant for Alzheimer's dementia, HTN, HLD, permanent A-fib on Eliquis, GERD. 12/25, patient was sent to ED from [facility] for poor oral intake, increased lethargy, failure to thrive. Workup showed hypernatremia of 167, AKI.  Urine culture grew ESBL E. coli. Patient completed the course of IV ertapenem.  She could not return to SNF because of unpaid bills.  Family unable to take care of her at home.  Medicaid pending. Difficult to place.  Subjective: Patient was seen and examined this morning.   Lying down in bed.  On mittens.  Not in distress. Family not at bedside  Assessment and plan: ESBL E. coli UTI Completed 5 days course of ertapenem   Hypernatremia Presented with sodium of 167 due to poor p.o. intake Started on hypotonic fluid.  Sodium level subsequently improved.  141 on last check on 2/21. Recent Labs  Lab 10/28/22 0352 10/29/22 0019  NA 145 142    Acute kidney injury Resolved. Recent Labs    09/27/22 0136 09/29/22 0303 09/30/22 0441 10/01/22 0248 10/09/22 0336 10/12/22 0306 10/15/22 0057 10/22/22 0325 10/28/22 0352 10/29/22 0019  BUN 22 25* '15 13 18 22 23 16 17 15  '$ CREATININE 0.59 0.58 0.50 0.49 0.47 0.48 0.51 0.56 0.49 0.53    Chronic iron deficiency anemia Hemoglobin low but stable.  Continue iron supplement. Recent Labs    09/29/22 0303 10/01/22 0248 10/09/22 0336 10/15/22 0057 10/22/22 0325  HGB 8.6* 8.2* 8.3* 8.4* 8.9*  MCV 75.6* 74.1* 73.4* 73.7* 74.1*    Permanent atrial fibrillation Continue flecainide, apixaban Metoprolol on hold due to soft blood pressure   Alzheimer's dementia Continue delirium precautions and supportive care. Melatonin for insomnia   Mobility: Encourage ambulation  Goals of care   Code Status:  DNR     DVT prophylaxis:  SCDs Start: 08/25/22 0659 apixaban (ELIQUIS) tablet 5 mg   Antimicrobials: None Fluid: None Consultants: None Family Communication: None at bedside  Status is: Inpatient Level of care: Med-Surg   Dispo: Patient is from: SNF              Anticipated d/c is to: Medically stable for discharge.  Difficult to place.  Per Education officer, museum, patient can return to previous SNF [facility] due to unpaid bills.  She has no family to assist at home.  Medicaid application pending.    Scheduled Meds:  apixaban  5 mg Oral BID   feeding supplement  237 mL Oral TID BM   ferrous sulfate  325 mg Oral QODAY   flecainide  50 mg Oral BID   leptospermum manuka honey  1 Application Topical Daily   multivitamin with minerals  1 tablet Oral Daily   pantoprazole  40 mg Oral Daily    PRN meds: acetaminophen **OR** acetaminophen, lip balm, melatonin, ondansetron **OR** ondansetron (ZOFRAN) IV   Infusions:    Diet:  Diet Order             DIET - DYS 1 Room service appropriate? Yes with Assist; Fluid consistency: Thin  Diet effective now                   Antimicrobials: Anti-infectives (From admission, onward)    Start     Dose/Rate Route Frequency Ordered Stop   09/03/22  1000  ertapenem (INVANZ) 1,000 mg in sodium chloride 0.9 % 100 mL IVPB        1 g 200 mL/hr over 30 Minutes Intravenous Every 24 hours 09/02/22 1421 09/04/22 2048   08/30/22 1500  ertapenem (INVANZ) 1,000 mg in sodium chloride 0.9 % 100 mL IVPB  Status:  Discontinued        1 g 200 mL/hr over 30 Minutes Intravenous Every 24 hours 08/30/22 1419 09/02/22 1421   08/29/22 1115  cefTRIAXone (ROCEPHIN) 1 g in sodium chloride 0.9 % 100 mL IVPB  Status:  Discontinued        1 g 200 mL/hr over 30 Minutes Intravenous Every 24 hours 08/29/22 1017 08/30/22 1419       Skin assessment:  Pressure Injury 09/04/22 Sacrum Unstageable - Full thickness tissue loss in which the base of the injury is covered by  slough (yellow, tan, gray, green or brown) and/or eschar (tan, brown or black) in the wound bed. (Active)  09/04/22 1030  Location: Sacrum  Location Orientation:   Staging: Unstageable - Full thickness tissue loss in which the base of the injury is covered by slough (yellow, tan, gray, green or brown) and/or eschar (tan, brown or black) in the wound bed.  Wound Description (Comments):   Present on Admission: No     Pressure Injury 09/30/22 Hip Right Deep Tissue Pressure Injury - Purple or maroon localized area of discolored intact skin or blood-filled blister due to damage of underlying soft tissue from pressure and/or shear. 2 cms x 1 cm x 0.1 cms 80% red moist 20 (Active)  09/30/22 1200  Location: Hip  Location Orientation: Right  Staging: Deep Tissue Pressure Injury - Purple or maroon localized area of discolored intact skin or blood-filled blister due to damage of underlying soft tissue from pressure and/or shear.  Wound Description (Comments): 2 cms x 1 cm x 0.1 cms 80% red moist 20% yellow  Present on Admission: No     Pressure Injury 10/20/22 Labia Left Stage 2 -  Partial thickness loss of dermis presenting as a shallow open injury with a red, pink wound bed without slough. Abrasion on left labia (Active)  10/20/22 2017  Location: Labia  Location Orientation: Left  Staging: Stage 2 -  Partial thickness loss of dermis presenting as a shallow open injury with a red, pink wound bed without slough.  Wound Description (Comments): Abrasion on left labia  Present on Admission: No      Nutritional status:  Body mass index is 17.4 kg/m.  Nutrition Problem: Severe Malnutrition Etiology: chronic illness Signs/Symptoms: severe muscle depletion, severe fat depletion     Objective: Vitals:   10/28/22 1944 10/29/22 0347  BP: 126/80 (!) 99/56  Pulse: 87 86  Resp:    Temp: (!) 97.5 F (36.4 C) 97.7 F (36.5 C)  SpO2:  94%    Intake/Output Summary (Last 24 hours) at 10/29/2022  1130 Last data filed at 10/28/2022 2230 Gross per 24 hour  Intake 350 ml  Output --  Net 350 ml    Filed Weights   10/12/22 0451 10/15/22 0500 10/20/22 0439  Weight: 63.2 kg 43.2 kg 43.1 kg   Weight change:  Body mass index is 17.4 kg/m.   Physical Exam: General exam: Elderly African-American female.  Not in physical distress Skin: No rashes, lesions or ulcers. HEENT: Atraumatic, normocephalic, no obvious bleeding Lungs: Clear to auscultation bilaterally CVS: Regular rhythm, tachycardic, no murmur GI/Abd soft, nontender, nondistended, bowel sound present  CNS: Alert, awake, able to answer yes/no questions. Psychiatry: Demented at baseline Extremities: No pedal edema, no calf tenderness  Data Review: I have personally reviewed the laboratory data and studies available.  F/u labs ordered Unresulted Labs (From admission, onward)    None       Total time spent in review of labs and imaging, patient evaluation, formulation of plan, documentation and communication with family: 25 minutes  Signed, Terrilee Croak, MD [facility] 10/29/2022

## 2022-10-30 NOTE — Progress Notes (Signed)
PROGRESS NOTE  Donna Simmons  DOB: 1941-08-19  PCP: [name], DO [id number]  DOA: 08/25/2022  LOS: 22 days  Hospital Day: 32  Brief narrative: Donna Simmons is a 82 y.o. female with PMH significant for Alzheimer's dementia, HTN, HLD, permanent A-fib on Eliquis, GERD. 12/25, patient was sent to ED from [facility] for poor oral intake, increased lethargy, failure to thrive. Workup showed hypernatremia of 167, AKI.  Urine culture grew ESBL E. coli. Patient completed the course of IV ertapenem.  She could not return to SNF because of unpaid bills.  Family unable to take care of her at home.  Medicaid pending. Difficult to place.  Subjective: Patient was seen and examined this morning.   Lying down in bed.  On mittens.  Not in distress. Family not at bedside  Assessment and plan: ESBL E. coli UTI Completed 5 days course of ertapenem   Hypernatremia Presented with sodium of 167 due to poor p.o. intake Started on hypotonic fluid.  Sodium level subsequently improved.  141 on last check on 2/21. Recent Labs  Lab 10/28/22 0352 10/29/22 0019  NA 145 142    Acute kidney injury Resolved. Recent Labs    09/27/22 0136 09/29/22 0303 09/30/22 0441 10/01/22 0248 10/09/22 0336 10/12/22 0306 10/15/22 0057 10/22/22 0325 10/28/22 0352 10/29/22 0019  BUN 22 25* '15 13 18 22 23 16 17 15  '$ CREATININE 0.59 0.58 0.50 0.49 0.47 0.48 0.51 0.56 0.49 0.53    Chronic iron deficiency anemia Hemoglobin low but stable.  Continue iron supplement. Recent Labs    09/29/22 0303 10/01/22 0248 10/09/22 0336 10/15/22 0057 10/22/22 0325  HGB 8.6* 8.2* 8.3* 8.4* 8.9*  MCV 75.6* 74.1* 73.4* 73.7* 74.1*    Permanent atrial fibrillation Continue flecainide, apixaban Metoprolol on hold due to soft blood pressure   Alzheimer's dementia Continue delirium precautions and supportive care. Melatonin for insomnia   Mobility: Encourage ambulation  Goals of care   Code Status:  DNR     DVT prophylaxis:  SCDs Start: 08/25/22 0659 apixaban (ELIQUIS) tablet 5 mg   Antimicrobials: None Fluid: None Consultants: None Family Communication: None at bedside  Status is: Inpatient Level of care: Med-Surg   Dispo: Patient is from: SNF              Anticipated d/c is to: Medically stable for discharge.  Difficult to place.  Per Education officer, museum, patient can return to previous SNF [facility] due to unpaid bills.  She has no family to assist at home.  Medicaid application pending.    Scheduled Meds:  apixaban  5 mg Oral BID   feeding supplement  237 mL Oral TID BM   ferrous sulfate  325 mg Oral QODAY   flecainide  50 mg Oral BID   leptospermum manuka honey  1 Application Topical Daily   multivitamin with minerals  1 tablet Oral Daily   pantoprazole  40 mg Oral Daily    PRN meds: acetaminophen **OR** acetaminophen, lip balm, melatonin, ondansetron **OR** ondansetron (ZOFRAN) IV   Infusions:    Diet:  Diet Order             DIET - DYS 1 Room service appropriate? Yes with Assist; Fluid consistency: Thin  Diet effective now                   Antimicrobials: Anti-infectives (From admission, onward)    Start     Dose/Rate Route Frequency Ordered Stop   09/03/22  1000  ertapenem (INVANZ) 1,000 mg in sodium chloride 0.9 % 100 mL IVPB        1 g 200 mL/hr over 30 Minutes Intravenous Every 24 hours 09/02/22 1421 09/04/22 2048   08/30/22 1500  ertapenem (INVANZ) 1,000 mg in sodium chloride 0.9 % 100 mL IVPB  Status:  Discontinued        1 g 200 mL/hr over 30 Minutes Intravenous Every 24 hours 08/30/22 1419 09/02/22 1421   08/29/22 1115  cefTRIAXone (ROCEPHIN) 1 g in sodium chloride 0.9 % 100 mL IVPB  Status:  Discontinued        1 g 200 mL/hr over 30 Minutes Intravenous Every 24 hours 08/29/22 1017 08/30/22 1419       Skin assessment:  Pressure Injury 09/04/22 Sacrum Unstageable - Full thickness tissue loss in which the base of the injury is covered by  slough (yellow, tan, gray, green or brown) and/or eschar (tan, brown or black) in the wound bed. (Active)  09/04/22 1030  Location: Sacrum  Location Orientation:   Staging: Unstageable - Full thickness tissue loss in which the base of the injury is covered by slough (yellow, tan, gray, green or brown) and/or eschar (tan, brown or black) in the wound bed.  Wound Description (Comments):   Present on Admission: No     Pressure Injury 09/30/22 Hip Right Deep Tissue Pressure Injury - Purple or maroon localized area of discolored intact skin or blood-filled blister due to damage of underlying soft tissue from pressure and/or shear. 2 cms x 1 cm x 0.1 cms 80% red moist 20 (Active)  09/30/22 1200  Location: Hip  Location Orientation: Right  Staging: Deep Tissue Pressure Injury - Purple or maroon localized area of discolored intact skin or blood-filled blister due to damage of underlying soft tissue from pressure and/or shear.  Wound Description (Comments): 2 cms x 1 cm x 0.1 cms 80% red moist 20% yellow  Present on Admission: No     Pressure Injury 10/20/22 Labia Left Stage 2 -  Partial thickness loss of dermis presenting as a shallow open injury with a red, pink wound bed without slough. Abrasion on left labia (Active)  10/20/22 2017  Location: Labia  Location Orientation: Left  Staging: Stage 2 -  Partial thickness loss of dermis presenting as a shallow open injury with a red, pink wound bed without slough.  Wound Description (Comments): Abrasion on left labia  Present on Admission: No      Nutritional status:  Body mass index is 17.42 kg/m.  Nutrition Problem: Severe Malnutrition Etiology: chronic illness Signs/Symptoms: severe muscle depletion, severe fat depletion     Objective: Vitals:   10/30/22 0447 10/30/22 0930  BP: 108/75 103/65  Pulse: 87 80  Resp: 17 16  Temp: 97.6 F (36.4 C) 97.6 F (36.4 C)  SpO2: 97% 97%    Intake/Output Summary (Last 24 hours) at 10/30/2022  1308 Last data filed at 10/29/2022 1830 Gross per 24 hour  Intake 240 ml  Output --  Net 240 ml    Filed Weights   10/15/22 0500 10/20/22 0439 10/30/22 0500  Weight: 43.2 kg 43.1 kg 43.2 kg   Weight change:  Body mass index is 17.42 kg/m.   Physical Exam: General exam: Elderly African-American female.  Not in physical distress Skin: No rashes, lesions or ulcers. HEENT: Atraumatic, normocephalic, no obvious bleeding Lungs: Clear to auscultation bilaterally CVS: Regular rhythm, tachycardic, no murmur GI/Abd soft, nontender, nondistended, bowel sound present CNS: Alert,  awake, able to answer yes/no questions. Psychiatry: Demented at baseline Extremities: No pedal edema, no calf tenderness  Data Review: I have personally reviewed the laboratory data and studies available.  F/u labs ordered Unresulted Labs (From admission, onward)    None       Total time spent in review of labs and imaging, patient evaluation, formulation of plan, documentation and communication with family: 25 minutes  Signed, Terrilee Croak, MD [facility] 10/30/2022

## 2022-10-30 NOTE — TOC Progression Note (Signed)
Transition of Care [facility]) - Progression Note    Patient Details  Name: Donna Simmons MRN: [id number] Date of Birth: [birth year]  Transition [facility]) CM/SW [place], [place] Phone Number: 10/30/2022, 12:42 PM  Clinical Narrative:     CSW followed up with [facility], and was advised they had Medicaid/ LOG beds open up. CSW re sent referral and central intake will review. No answer from [place] or [place]. TOC will continue to follow for DC needs.        Expected Discharge Plan and Services                                               Social Determinants of Health (SDOH) Interventions SDOH Screenings   Food Insecurity: Unknown (10/19/2022)  Alcohol Screen: Low Risk  (04/12/2019)  Depression (PHQ2-9): Low Risk  (11/05/2020)  Tobacco Use: Low Risk  (10/19/2022)    Readmission Risk Interventions     No data to display

## 2022-10-31 DIAGNOSIS — E870 Hyperosmolality and hypernatremia: Secondary | ICD-10-CM | POA: Diagnosis not present

## 2022-10-31 LAB — CBC WITH DIFFERENTIAL/PLATELET
Abs Immature Granulocytes: 0.01 10*3/uL (ref 0.00–0.07)
Basophils Absolute: 0 10*3/uL (ref 0.0–0.1)
Basophils Relative: 1 %
Eosinophils Absolute: 0 10*3/uL (ref 0.0–0.5)
Eosinophils Relative: 1 %
HCT: 31.1 % — ABNORMAL LOW (ref 36.0–46.0)
Hemoglobin: 9.1 g/dL — ABNORMAL LOW (ref 12.0–15.0)
Immature Granulocytes: 0 %
Lymphocytes Relative: 34 %
Lymphs Abs: 1.1 10*3/uL (ref 0.7–4.0)
MCH: 21.9 pg — ABNORMAL LOW (ref 26.0–34.0)
MCHC: 29.3 g/dL — ABNORMAL LOW (ref 30.0–36.0)
MCV: 74.9 fL — ABNORMAL LOW (ref 80.0–100.0)
Monocytes Absolute: 0.3 10*3/uL (ref 0.1–1.0)
Monocytes Relative: 8 %
Neutro Abs: 1.9 10*3/uL (ref 1.7–7.7)
Neutrophils Relative %: 56 %
Platelets: 375 10*3/uL (ref 150–400)
RBC: 4.15 MIL/uL (ref 3.87–5.11)
RDW: 20 % — ABNORMAL HIGH (ref 11.5–15.5)
WBC: 3.4 10*3/uL — ABNORMAL LOW (ref 4.0–10.5)
nRBC: 0 % (ref 0.0–0.2)

## 2022-10-31 LAB — BASIC METABOLIC PANEL
Anion gap: 7 (ref 5–15)
BUN: 17 mg/dL (ref 8–23)
CO2: 28 mmol/L (ref 22–32)
Calcium: 8.8 mg/dL — ABNORMAL LOW (ref 8.9–10.3)
Chloride: 106 mmol/L (ref 98–111)
Creatinine, Ser: 0.56 mg/dL (ref 0.44–1.00)
GFR, Estimated: >60 mL/min (ref >60)
Glucose, Bld: 98 mg/dL (ref 70–99)
Potassium: 3.4 mmol/L — ABNORMAL LOW (ref 3.5–5.1)
Sodium: 141 mmol/L (ref 135–145)

## 2022-10-31 MED ORDER — SODIUM CHLORIDE 0.9 % IV SOLN: INTRAVENOUS | Status: AC

## 2022-10-31 MED ORDER — POTASSIUM CHLORIDE CRYS ER 20 MEQ PO TBCR
40.0000 meq | EXTENDED_RELEASE_TABLET | Freq: Once | ORAL | Status: AC
  Administered 2022-10-31: 40 meq via ORAL
  Filled 2022-10-31: qty 2

## 2022-10-31 MED ORDER — SODIUM CHLORIDE 0.9 % IV BOLUS
500.0000 mL | Freq: Once | INTRAVENOUS | Status: AC
  Administered 2022-10-31: 500 mL via INTRAVENOUS

## 2022-10-31 NOTE — Progress Notes (Addendum)
       CROSS COVER NOTE  NAME: Donna Simmons MRN: [id number] DOB : [birth year]    Date of Service   10/31/2022     HPI/Events of Note   RN reports hypotension, 72/56 (61). Prior BP was also soft.  All other vital signs WNL.  Per RN, patient is alert and confused with clear lungs, no peripheral edema, appears frail, and has poor appetite. 500 cc IV fluid bolus was ordered.  2200- BP improved after bolus. Will continue with IV gentle hydration.    Interventions/ Plan   Fluid bolus Gentle hydration.       Raenette Rover, DNP, [place]

## 2022-10-31 NOTE — Progress Notes (Signed)
  Progress Note   Patient: Donna Simmons [id number] DOB: 05-15-1941 DOA: 08/25/2022     66 DOS: the patient was seen and examined on 10/31/2022   Brief hospital course:  Assessment and Plan:  ESBL E. coli UTI - s/p 5 days course of ertapenem   Hypernatremia - Resolved    Acute kidney injury - Resolved   Chronic iron deficiency anemia - Hgb stable  - Ferrous sulfate 325 mg PO every other day   Permanent atrial fibrillation - Eliquis 5 mg PO bid  - Flecainide 50 mg PO bid    Alzheimer's dementia - Supportive care. - Melatonin 5 mg PO qhs PRN  - Case management working on disposition (medicaid pending)   DVT prophylaxis: Eliquis as above  GI prophylaxis: Protonix 40 mg PO daily      Subjective: Pt seen and examined at the bedside. Na+ 141 today. Medicaid pending and case management helping with disposition.    Physical Exam: Vitals:   10/30/22 1541 10/31/22 0030 10/31/22 0449 10/31/22 0922  BP: 1'09/65 98/70 96/62 '$ 100/75  Pulse: 78 100 75 69  Resp: '17 18 20 16  '$ Temp: 97.8 F (36.6 C) 98.8 F (37.1 C) 98.4 F (36.9 C) 97.7 F (36.5 C)  TempSrc:  Oral Oral   SpO2:  94%    Weight:      Height:       Physical Exam Constitutional:      Comments: Drowsy but awake   HENT:     Head: Atraumatic.     Mouth/Throat:     Mouth: Mucous membranes are moist.  Cardiovascular:     Rate and Rhythm: Normal rate and regular rhythm.  Pulmonary:     Effort: Pulmonary effort is normal.  Abdominal:     General: Abdomen is flat.  Musculoskeletal:     Cervical back: Neck supple.     Comments: Partially contracted in the bed   Skin:    General: Skin is warm.  Neurological:     Mental Status: Mental status is at baseline.  Psychiatric:        Mood and Affect: Mood normal.     Data Reviewed:   Disposition: Status is: Inpatient  Planned Discharge Destination: Skilled nursing facility    Time spent: 35 minutes  Author: Lucienne Minks , MD 10/31/2022 10:11  AM  For on call review [email or web address].

## 2022-10-31 NOTE — Progress Notes (Signed)
Physical Therapy Wound Treatment Patient Details  Name: JALEN GARG MRN: [id number] Date of Birth: 30-Jan-1941  Today's Date: 10/31/2022 Time: 1009-1035 Time Calculation (min): 26 min  Subjective  Subjective Assessment Subjective: Pt nonverbal throughout, slept through most of session Patient and Family Stated Goals: placement in [facility] Prior Treatments: dressing changes  Pain Score:  Pt asleep through most of session.   Wound Assessment  Pressure Injury 09/30/22 Hip Right Deep Tissue Pressure Injury - Purple or maroon localized area of discolored intact skin or blood-filled blister due to damage of underlying soft tissue from pressure and/or shear. 2 cms x 1 cm x 0.1 cms 80% red moist 20 (Active)  Dressing Type Foam - Lift dressing to assess site every shift;Gauze (Comment);Honey;Barrier Film (skin prep) 10/31/22 1316  Dressing Changed 10/31/22 1316  Dressing Change Frequency Daily 10/31/22 1316  State of Healing Eschar 10/31/22 1316  Site / Wound Assessment Black;Pink;Yellow 10/31/22 1316  % Wound base Red or Granulating 10% 10/31/22 1316  % Wound base Yellow/Fibrinous Exudate 50% 10/31/22 1316  % Wound base Black/Eschar 40% 10/31/22 1316  % Wound base Other/Granulation Tissue (Comment) 0% 10/31/22 1316  Peri-wound Assessment Pink;Erythema (blanchable);Denuded 10/31/22 1316  Wound Length (cm) 3.8 cm 10/28/22 1433  Wound Width (cm) 1.9 cm 10/28/22 1433  Wound Depth (cm) 0.2 cm 10/28/22 1433  Wound Surface Area (cm^2) 7.22 cm^2 10/28/22 1433  Wound Volume (cm^3) 1.44 cm^3 10/28/22 1433  Tunneling (cm) 0 10/28/22 1433  Undermining (cm) 0 10/28/22 1433  Margins Unattached edges (unapproximated) 10/31/22 1316  Drainage Amount Minimal 10/31/22 1316  Drainage Description Serosanguineous 10/31/22 1316  Treatment Debridement (Selective);Irrigation;Packing (Dry gauze) 10/31/22 1316      Selective Debridement (non-excisional) Selective Debridement (non-excisional) - Location: R  hip Selective Debridement (non-excisional) - Tools Used: Forceps, Scalpel Selective Debridement (non-excisional) - Tissue Removed: eschar    Wound Assessment and Plan  Wound Therapy - Assess/Plan/Recommendations Wound Therapy - Clinical Statement: Progressing with debridement of eschar. Pt tolerated well overall without complaints of pain. This patient will benefit from continued wound therapy for selective removal of unviable tissue, to decrease bioburden, and promote wound bed healing. Wound Therapy - Functional Problem List: pt unable to turn herself in the bed and has due to cognitive decline is not able to understand need for offloading Factors Delaying/Impairing Wound Healing: Immobility, Incontinence, Multiple medical problems Hydrotherapy Plan: Debridement, Dressing change, Patient/family education Wound Therapy - Frequency:  (2x / week) Wound Therapy - Follow Up Recommendations: dressing changes by RN  Wound Therapy Goals- Improve the function of patient's integumentary system by progressing the wound(s) through the phases of wound healing (inflammation - proliferation - remodeling) by: Wound Therapy Goals - Improve the function of patient's integumentary system by progressing the wound(s) through the phases of wound healing by: Decrease Necrotic Tissue to: 20% Decrease Necrotic Tissue - Progress: Progressing toward goal Increase Granulation Tissue to: 80% Increase Granulation Tissue - Progress: Progressing toward goal Goals/treatment plan/discharge plan were made with and agreed upon by patient/family: No, Patient unable to participate in goals/treatment/discharge plan and family unavailable Time For Goal Achievement: 7 days Wound Therapy - Potential for Goals: Good  Goals will be updated until maximal potential achieved or discharge criteria met.  Discharge criteria: when goals achieved, discharge from hospital, MD decision/surgical intervention, no progress towards goals,  refusal/missing three consecutive treatments without notification or medical reason.  GP     Charges PT Wound Care Charges $Wound Debridement up to 20 cm: < or equal to 20  cm $PT Hydrotherapy Dressing: 1 dressing $PT Hydrotherapy Visit: 1 Visit       Thelma Comp 10/31/2022, 1:22 PM  Rolinda Roan, PT, DPT Acute Rehabilitation Services Secure Chat Preferred Office: [phone]

## 2022-11-01 DIAGNOSIS — E870 Hyperosmolality and hypernatremia: Secondary | ICD-10-CM | POA: Diagnosis not present

## 2022-11-01 LAB — COMPREHENSIVE METABOLIC PANEL
ALT: 27 U/L (ref 0–44)
AST: 30 U/L (ref 15–41)
Albumin: 2.2 g/dL — ABNORMAL LOW (ref 3.5–5.0)
Alkaline Phosphatase: 64 U/L (ref 38–126)
Anion gap: 11 (ref 5–15)
BUN: 24 mg/dL — ABNORMAL HIGH (ref 8–23)
CO2: 25 mmol/L (ref 22–32)
Calcium: 8.5 mg/dL — ABNORMAL LOW (ref 8.9–10.3)
Chloride: 105 mmol/L (ref 98–111)
Creatinine, Ser: 0.6 mg/dL (ref 0.44–1.00)
GFR, Estimated: >60 mL/min (ref >60)
Glucose, Bld: 102 mg/dL — ABNORMAL HIGH (ref 70–99)
Potassium: 4 mmol/L (ref 3.5–5.1)
Sodium: 141 mmol/L (ref 135–145)
Total Bilirubin: 0.2 mg/dL — ABNORMAL LOW (ref 0.3–1.2)
Total Protein: 6.2 g/dL — ABNORMAL LOW (ref 6.5–8.1)

## 2022-11-01 LAB — CBC
HCT: 31.4 % — ABNORMAL LOW (ref 36.0–46.0)
Hemoglobin: 9.4 g/dL — ABNORMAL LOW (ref 12.0–15.0)
MCH: 22.2 pg — ABNORMAL LOW (ref 26.0–34.0)
MCHC: 29.9 g/dL — ABNORMAL LOW (ref 30.0–36.0)
MCV: 74.1 fL — ABNORMAL LOW (ref 80.0–100.0)
Platelets: 372 10*3/uL (ref 150–400)
RBC: 4.24 MIL/uL (ref 3.87–5.11)
RDW: 20.1 % — ABNORMAL HIGH (ref 11.5–15.5)
WBC: 6.2 10*3/uL (ref 4.0–10.5)
nRBC: 0 % (ref 0.0–0.2)

## 2022-11-01 LAB — MAGNESIUM: Magnesium: 2.1 mg/dL (ref 1.7–2.4)

## 2022-11-01 NOTE — Progress Notes (Signed)
  Progress Note   Patient: Donna Simmons [id number] DOB: 27-Mar-1941 DOA: 08/25/2022     67 DOS: the patient was seen and examined on 11/01/2022   Brief hospital course:  Assessment and Plan: ESBL E. coli UTI - s/p 5 days course of ertapenem   Hypernatremia - Resolved    Acute kidney injury - Resolved   Chronic iron deficiency anemia - Hgb stable  - Ferrous sulfate 325 mg PO every other day    Permanent atrial fibrillation - Eliquis 5 mg PO bid  - Flecainide 50 mg PO bid    Alzheimer's dementia - Supportive care. - Melatonin 5 mg PO qhs PRN  - Case management working on disposition (medicaid pending)    DVT prophylaxis: Eliquis as above  GI prophylaxis: Protonix 40 mg PO daily       Subjective: Pt seen and examined at the bedside. Labs are stable. Case management assisting with disposition.  Physical Exam: Vitals:   10/31/22 1749 10/31/22 2000 10/31/22 2147 11/01/22 0600  BP: (!) 85/48 (!) 72/56 95/66 105/77  Pulse: (!) 106 77 65 88  Resp: '17 16 16 16  '$ Temp: 97.6 F (36.4 C) 98.6 F (37 C) 98.9 F (37.2 C) 98.6 F (37 C)  TempSrc: Axillary Oral Oral Oral  SpO2: 91%  92% 100%  Weight:      Height:       Constitutional:      Comments: Awake  HENT:     Head: Atraumatic.     Mouth/Throat:     Mouth: Mucous membranes are moist.  Cardiovascular:     Rate and Rhythm: Normal rate and regular rhythm.  Pulmonary:     Effort: Pulmonary effort is normal.  Abdominal:     General: Abdomen is flat.  Musculoskeletal:     Cervical back: Neck supple.     Comments: Partially contracted in the bed   Skin:    General: Skin is warm.  Neurological:     Mental Status: Mental status is at baseline.  Psychiatric:        Mood and Affect: Mood normal.   Data Reviewed:   Disposition: Status is: Inpatient  Planned Discharge Destination: Skilled nursing facility    Time spent: 35 minutes  Author: Lucienne Minks , MD 11/01/2022 9:43 AM  For on call review  [email or web address].

## 2022-11-02 DIAGNOSIS — E870 Hyperosmolality and hypernatremia: Secondary | ICD-10-CM | POA: Diagnosis not present

## 2022-11-02 LAB — CBC
HCT: 30.2 % — ABNORMAL LOW (ref 36.0–46.0)
Hemoglobin: 9 g/dL — ABNORMAL LOW (ref 12.0–15.0)
MCH: 22.1 pg — ABNORMAL LOW (ref 26.0–34.0)
MCHC: 29.8 g/dL — ABNORMAL LOW (ref 30.0–36.0)
MCV: 74.2 fL — ABNORMAL LOW (ref 80.0–100.0)
Platelets: 368 10*3/uL (ref 150–400)
RBC: 4.07 MIL/uL (ref 3.87–5.11)
RDW: 20.3 % — ABNORMAL HIGH (ref 11.5–15.5)
WBC: 5 10*3/uL (ref 4.0–10.5)
nRBC: 0 % (ref 0.0–0.2)

## 2022-11-02 LAB — COMPREHENSIVE METABOLIC PANEL
ALT: 22 U/L (ref 0–44)
AST: 22 U/L (ref 15–41)
Albumin: 2.2 g/dL — ABNORMAL LOW (ref 3.5–5.0)
Alkaline Phosphatase: 64 U/L (ref 38–126)
Anion gap: 7 (ref 5–15)
BUN: 18 mg/dL (ref 8–23)
CO2: 24 mmol/L (ref 22–32)
Calcium: 8.2 mg/dL — ABNORMAL LOW (ref 8.9–10.3)
Chloride: 108 mmol/L (ref 98–111)
Creatinine, Ser: 0.54 mg/dL (ref 0.44–1.00)
GFR, Estimated: >60 mL/min (ref >60)
Glucose, Bld: 89 mg/dL (ref 70–99)
Potassium: 3.6 mmol/L (ref 3.5–5.1)
Sodium: 139 mmol/L (ref 135–145)
Total Bilirubin: 0.5 mg/dL (ref 0.3–1.2)
Total Protein: 6 g/dL — ABNORMAL LOW (ref 6.5–8.1)

## 2022-11-02 LAB — MAGNESIUM: Magnesium: 2 mg/dL (ref 1.7–2.4)

## 2022-11-02 MED ORDER — SODIUM CHLORIDE 0.9 % IV SOLN: INTRAVENOUS | Status: DC

## 2022-11-02 NOTE — Progress Notes (Signed)
  Progress Note   Patient: Donna Simmons [id number] DOB: [birth year] DOA: 08/25/2022     68 DOS: the patient was seen and examined on 11/02/2022   Brief hospital course:  Assessment and Plan: ESBL E. coli UTI - s/p 5 days course of ertapenem   Hypernatremia - Resolved    Acute kidney injury - Resolved   Chronic iron deficiency anemia - Hgb stable  - Ferrous sulfate 325 mg PO every other day    Permanent atrial fibrillation - Eliquis 5 mg PO bid  - Flecainide 50 mg PO bid    Alzheimer's dementia - Supportive care. - Melatonin 5 mg PO qhs PRN  - Case management working on disposition (medicaid pending)    DVT prophylaxis: Eliquis as above  GI prophylaxis: Protonix 40 mg PO daily        Subjective: Pt seen and examined at the bedside. Case management working on disposition. Labs are stable.   Physical Exam: Vitals:   11/01/22 2000 11/02/22 0500 11/02/22 0514 11/02/22 0754  BP: 93/65  (!) 94/58 (!) 86/51  Pulse: 91  84 69  Resp: 16   16  Temp: 98.7 F (37.1 C)  98.6 F (37 C) 98.7 F (37.1 C)  TempSrc: Oral  Oral Oral  SpO2: 100%   99%  Weight:  43 kg    Height:       Constitutional:      Comments: Drowsy  HENT:     Head: Atraumatic.     Mouth/Throat:     Mouth: Mucous membranes are moist.  Cardiovascular:     Rate and Rhythm: Normal rate and regular rhythm.  Pulmonary:     Effort: Pulmonary effort is normal.  Abdominal:     General: Abdomen is flat.  Musculoskeletal:     Cervical back: Neck supple.     Comments: Partially contracted in the bed   Skin:    General: Skin is warm.  Neurological:     Mental Status: Mental status is at baseline.  Psychiatric:        Mood and Affect: Mood normal.   Data Reviewed:   Disposition: Status is: Inpatient  Planned Discharge Destination: Skilled nursing facility    Time spent: 35 minutes  Author: Lucienne Minks , MD 11/02/2022 8:29 AM  For on call review [email or web address].

## 2022-11-03 DIAGNOSIS — E870 Hyperosmolality and hypernatremia: Secondary | ICD-10-CM | POA: Diagnosis not present

## 2022-11-03 LAB — COMPREHENSIVE METABOLIC PANEL
ALT: 19 U/L (ref 0–44)
AST: 20 U/L (ref 15–41)
Albumin: 2.1 g/dL — ABNORMAL LOW (ref 3.5–5.0)
Alkaline Phosphatase: 67 U/L (ref 38–126)
Anion gap: 7 (ref 5–15)
BUN: 22 mg/dL (ref 8–23)
CO2: 24 mmol/L (ref 22–32)
Calcium: 8.3 mg/dL — ABNORMAL LOW (ref 8.9–10.3)
Chloride: 108 mmol/L (ref 98–111)
Creatinine, Ser: 0.56 mg/dL (ref 0.44–1.00)
GFR, Estimated: >60 mL/min (ref >60)
Glucose, Bld: 103 mg/dL — ABNORMAL HIGH (ref 70–99)
Potassium: 3.6 mmol/L (ref 3.5–5.1)
Sodium: 139 mmol/L (ref 135–145)
Total Bilirubin: 0.3 mg/dL (ref 0.3–1.2)
Total Protein: 5.7 g/dL — ABNORMAL LOW (ref 6.5–8.1)

## 2022-11-03 LAB — CBC
HCT: 30.7 % — ABNORMAL LOW (ref 36.0–46.0)
Hemoglobin: 9 g/dL — ABNORMAL LOW (ref 12.0–15.0)
MCH: 22.2 pg — ABNORMAL LOW (ref 26.0–34.0)
MCHC: 29.3 g/dL — ABNORMAL LOW (ref 30.0–36.0)
MCV: 75.6 fL — ABNORMAL LOW (ref 80.0–100.0)
Platelets: 347 10*3/uL (ref 150–400)
RBC: 4.06 MIL/uL (ref 3.87–5.11)
RDW: 20.5 % — ABNORMAL HIGH (ref 11.5–15.5)
WBC: 5.8 10*3/uL (ref 4.0–10.5)
nRBC: 0 % (ref 0.0–0.2)

## 2022-11-03 LAB — MAGNESIUM: Magnesium: 1.9 mg/dL (ref 1.7–2.4)

## 2022-11-03 MED ORDER — MEDIHONEY WOUND/BURN DRESSING EX PSTE
1.0000 | PASTE | Freq: Every day | CUTANEOUS | Status: AC
  Administered 2022-11-04 – 2022-11-17 (×11): 1 via TOPICAL
  Filled 2022-11-03 (×2): qty 44

## 2022-11-03 NOTE — Progress Notes (Signed)
PROGRESS NOTE  Donna Simmons [id number] DOB: 1941/01/24 DOA: 08/25/2022 PCP: [name], DO   LOS: 66 days   Brief Narrative / Interim history: 82 year old with past medical history significant for Alzheimer's dementia, hypertension, hyperlipidemia, permanent A-fib on Eliquis admitted from SNF with failure to thrive, hypernatremia with a sodium of 167, poor oral intake.  Workup revealed UTI ESBL.  Patient treated with IV Ertapenem.  Currently awaiting long-term placement.  She could not return to SNF because of unpaid bills, family is unable to take care of her at home, and Medicaid is pending.  Difficult to place  Subjective / 24h Interval events: She is doing well this morning, has no complaints for me.  Husband is at bedside  Assesement and Plan: Principal Problem:   Hypernatremia Active Problems:   GERD (gastroesophageal reflux disease)   Leukocytosis   Thrombocytosis   Hypokalemia   Hypoalbuminemia due to protein-calorie malnutrition (HCC)   Transaminitis   AKI (acute kidney injury) ([place])   Permanent atrial fibrillation (HCC)   Iron deficiency anemia   Alzheimer's dementia ([place])   Insomnia   Failure to thrive (child)   Principal problem UTI, E. coli ESBL -patient was diagnosed with a ESBL E. coli, speciated 08/30/2022.  Completed 5 days of ertapenem.  No further symptoms  Active problems Hypernatremia - Presented  with a sodium level of 167, likely in the setting of poor oral intake and hypovolemia. Resolved with IV fluids.  Sodium remains normal this morning  AKI -resolved with fluids  Hypokalemia -continue to monitor and replace as indicated   Elevated LFTs-resolved.   Hypoalbuminemia - In the setting  of poor oral intake   Iron deficiency anemia -hemoglobin is stable   Permanent A-fib -continue Eliquis and flecainide   Alzheimer's dementia - Delirium precaution.    Disposition-Medicaid pending.  TOC following   Scheduled Meds:  apixaban  5 mg  Oral BID   feeding supplement  237 mL Oral TID BM   ferrous sulfate  325 mg Oral QODAY   flecainide  50 mg Oral BID   leptospermum manuka honey  1 Application Topical Daily   multivitamin with minerals  1 tablet Oral Daily   pantoprazole  40 mg Oral Daily   Continuous Infusions:  sodium chloride 75 mL/hr at 11/03/22 0331   PRN Meds:.acetaminophen **OR** acetaminophen, lip balm, melatonin, ondansetron **OR** ondansetron (ZOFRAN) IV  Current Outpatient Medications  Medication Instructions   acetaminophen (TYLENOL) 650 mg, Oral, Every 6 hours PRN   apixaban (ELIQUIS) 5 MG TABS tablet Take 1 tablet by mouth twice daily   Cholecalciferol (VITAMIN D-3 PO) 1 capsule, Oral, Daily   Ensure (ENSURE) 237 mLs, Oral, 2 times daily, Vanilla   flecainide (TAMBOCOR) 50 MG tablet TAKE 1 TABLET BY MOUTH TWICE DAILY. APPOINTMENT REQUIRED FOR FUTURE REFILLS   melatonin 5 mg, Oral, At bedtime PRN   metoprolol succinate (TOPROL-XL) 12.5 mg, Oral, Daily   omeprazole (PRILOSEC) 40 mg, Oral, Daily   polyethylene glycol (MIRALAX / GLYCOLAX) 17 g, Oral, Daily PRN   traZODone (DESYREL) 50 mg, Oral, Daily at bedtime    Diet Orders (From admission, onward)     Start     Ordered   10/29/22 0752  DIET - DYS 1 Room service appropriate? Yes with Assist; Fluid consistency: Thin  Diet effective now       Question Answer Comment  Room service appropriate? Yes with Assist   Fluid consistency: Thin      10/29/22 0751  DVT prophylaxis: SCDs Start: 08/25/22 0659 apixaban (ELIQUIS) tablet 5 mg   Lab Results  Component Value Date   PLT 347 11/03/2022      Code Status: DNR  Family Communication: family at bedside   Status is: Inpatient  Remains inpatient appropriate because: difficult to place  Level of care: Med-Surg  Consultants:  none  Objective: Vitals:   11/02/22 1131 11/02/22 1534 11/02/22 2100 11/03/22 0615  BP: (!) 88/60 103/61 105/68 (!) 98/54  Pulse: 75 72 (!) 59 66   Resp: '20 19 18 18  '$ Temp:  98.8 F (37.1 C) 98.6 F (37 C) 97.8 F (36.6 C)  TempSrc:  Tympanic Axillary Axillary  SpO2:  98% 91% 100%  Weight:      Height:       No intake or output data in the 24 hours ending 11/03/22 0812 Wt Readings from Last 3 Encounters:  11/02/22 43 kg  07/03/22 50.2 kg  04/24/22 54.4 kg    Examination:  Constitutional: NAD Eyes: no scleral icterus ENMT: Mucous membranes are moist.  Neck: normal, supple Respiratory: clear to auscultation bilaterally, no wheezing, no crackles. Normal respiratory effort.  Cardiovascular: Regular rate and rhythm, no murmurs / rubs / gallops. Abdomen: non distended, no tenderness. Bowel sounds positive.  Musculoskeletal: no clubbing / cyanosis.  Skin: no rashes Neurologic: non focal  Data Reviewed: I have independently reviewed following labs and imaging studies   CBC Recent Labs  Lab 10/31/22 0617 11/01/22 0507 11/02/22 0449 11/03/22 0334  WBC 3.4* 6.2 5.0 5.8  HGB 9.1* 9.4* 9.0* 9.0*  HCT 31.1* 31.4* 30.2* 30.7*  PLT 375 372 368 347  MCV 74.9* 74.1* 74.2* 75.6*  MCH 21.9* 22.2* 22.1* 22.2*  MCHC 29.3* 29.9* 29.8* 29.3*  RDW 20.0* 20.1* 20.3* 20.5*  LYMPHSABS 1.1  --   --   --   MONOABS 0.3  --   --   --   EOSABS 0.0  --   --   --   BASOSABS 0.0  --   --   --     Recent Labs  Lab 10/29/22 0019 10/31/22 0617 11/01/22 0507 11/02/22 0449 11/03/22 0334  NA 142 141 141 139 139  K 4.0 3.4* 4.0 3.6 3.6  CL 108 106 105 108 108  CO2 '25 28 25 24 24  '$ GLUCOSE 90 98 102* 89 103*  BUN 15 17 24* 18 22  CREATININE 0.53 0.56 0.60 0.54 0.56  CALCIUM 8.8* 8.8* 8.5* 8.2* 8.3*  AST  --   --  '30 22 20  '$ ALT  --   --  '27 22 19  '$ ALKPHOS  --   --  64 64 67  BILITOT  --   --  0.2* 0.5 0.3  ALBUMIN  --   --  2.2* 2.2* 2.1*  MG  --   --  2.1 2.0 1.9    ------------------------------------------------------------------------------------------------------------------ No results for input(s): "CHOL", "HDL",  "LDLCALC", "TRIG", "CHOLHDL", "LDLDIRECT" in the last 72 hours.  Lab Results  Component Value Date   HGBA1C 5.3 04/04/2020   ------------------------------------------------------------------------------------------------------------------ No results for input(s): "TSH", "T4TOTAL", "T3FREE", "THYROIDAB" in the last 72 hours.  Invalid input(s): "FREET3"  Cardiac Enzymes No results for input(s): "CKMB", "TROPONINI", "MYOGLOBIN" in the last 168 hours.  Invalid input(s): "CK" ------------------------------------------------------------------------------------------------------------------    Component Value Date/Time   BNP 73.3 10/09/2019 1035    CBG: No results for input(s): "GLUCAP" in the last 168 hours.  No results found for this or any previous  visit (from the past 240 hour(s)).   Radiology Studies: No results found.   Marzetta Board, MD, PhD [facility]  Between 7 am - 7 pm I am available, please contact me via Amion (for emergencies) or Securechat (non urgent messages)  Between 7 pm - 7 am I am not available, please contact night coverage MD/APP via Amion

## 2022-11-03 NOTE — TOC Progression Note (Signed)
Transition of Care [facility]) - Progression Note    Patient Details  Name: Donna Simmons MRN: [id number] Date of Birth: Sep 07, 1940  Transition of Care [facility]) CM/SW [place], [place] Phone Number: 11/03/2022, 1:25 PM  Clinical Narrative:    CSW followed up with several facilities that are considering.  [facility], [place], and [place] have not responded. [facility] declined any LOG or Medicaid pending.  CSW updated medical team and asked if getting palliative involved again would be an option. TOC will continue to follow for DC needs.          Expected Discharge Plan and Services                                               Social Determinants of Health (SDOH) Interventions SDOH Screenings   Food Insecurity: Unknown (10/19/2022)  Alcohol Screen: Low Risk  (04/12/2019)  Depression (PHQ2-9): Low Risk  (11/05/2020)  Tobacco Use: Low Risk  (10/19/2022)    Readmission Risk Interventions     No data to display

## 2022-11-03 NOTE — Consult Note (Signed)
[place] Nurse wound follow up Wound type: pressure  Measurement:  Sacrum Stage 4 Pressure Injury 3 cms x 3 cms x 1 cm with 1 cm undermining from 6 o'clock to 9 o'clock wound bed 10% yellow slough 90% pink moist  R hip Unstageable Pressure Injury 3 cms x 2 cms x 0.1 cm 80% tan yellow slough 20% pink moist  R medial foot (base of great toe) Stage 4 Pressure Injury 2 cms x 2 cms x 0.1 cms 90% pink moist, 10% bone/cartilage  R great toe former area of Deep Tissue Injury that is now a Stage 2 Pressure Injury 0.5 cms x 0.5 cms x 0.1 cm 100% pink moist  L lateral foot Stage 2 1 cm x 1 cm x 0.1 cm 100% pink moist   Drainage (amount, consistency, odor) minimal tan from R hip, minimal serosanguinous from all other wounds, no malodor noted to any wound  Periwound: scarring to sacrum, all others intact  Dressing procedure/placement/frequency:  Continue NS moist to dry dressings twice daily to sacrum.  R hip receiving hydrotherapy, continue Medihoney to wound bed daily.  R medial foot, R great toe, and L lateral foot clean with NS, continue with silver hydrofiber daily Kellie Simmering [phone]).   Patient remains on low air loss mattress for moisture management and pressure redistribution.  Prevalon boots in place at todays visit to offload pressure points (patient is contracted).   WOC will follow patient weekly.   Thank you,     Ardel Jagger MSN, RN-BC, [employer]

## 2022-11-04 ENCOUNTER — Inpatient Hospital Stay (HOSPITAL_COMMUNITY): Payer: Medicare PPO

## 2022-11-04 DIAGNOSIS — E870 Hyperosmolality and hypernatremia: Secondary | ICD-10-CM | POA: Diagnosis not present

## 2022-11-04 LAB — BASIC METABOLIC PANEL
Anion gap: 4 — ABNORMAL LOW (ref 5–15)
BUN: 12 mg/dL (ref 8–23)
CO2: 25 mmol/L (ref 22–32)
Calcium: 8.3 mg/dL — ABNORMAL LOW (ref 8.9–10.3)
Chloride: 108 mmol/L (ref 98–111)
Creatinine, Ser: 0.53 mg/dL (ref 0.44–1.00)
GFR, Estimated: >60 mL/min (ref >60)
Glucose, Bld: 101 mg/dL — ABNORMAL HIGH (ref 70–99)
Potassium: 3.4 mmol/L — ABNORMAL LOW (ref 3.5–5.1)
Sodium: 137 mmol/L (ref 135–145)

## 2022-11-04 LAB — CBC
HCT: 30.1 % — ABNORMAL LOW (ref 36.0–46.0)
Hemoglobin: 9.3 g/dL — ABNORMAL LOW (ref 12.0–15.0)
MCH: 22.6 pg — ABNORMAL LOW (ref 26.0–34.0)
MCHC: 30.9 g/dL (ref 30.0–36.0)
MCV: 73.1 fL — ABNORMAL LOW (ref 80.0–100.0)
Platelets: 330 10*3/uL (ref 150–400)
RBC: 4.12 MIL/uL (ref 3.87–5.11)
RDW: 20.6 % — ABNORMAL HIGH (ref 11.5–15.5)
WBC: 5.7 10*3/uL (ref 4.0–10.5)
nRBC: 0 % (ref 0.0–0.2)

## 2022-11-04 LAB — MAGNESIUM: Magnesium: 1.9 mg/dL (ref 1.7–2.4)

## 2022-11-04 LAB — GLUCOSE, CAPILLARY: Glucose-Capillary: 148 mg/dL — ABNORMAL HIGH (ref 70–99)

## 2022-11-04 MED ORDER — POTASSIUM CHLORIDE 20 MEQ PO PACK
40.0000 meq | PACK | Freq: Once | ORAL | Status: AC
  Administered 2022-11-04: 40 meq via ORAL
  Filled 2022-11-04: qty 2

## 2022-11-04 NOTE — Progress Notes (Signed)
Physical Therapy Wound Treatment Patient Details  Name: ESPN WHRITENOUR MRN: [id number] Date of Birth: 15-Aug-1941  Today's Date: 11/04/2022 Time: [id number] Time Calculation (min): 28 min  Subjective  Subjective Assessment Subjective: Pt nonverbal throughout, slept through most of session Patient and Family Stated Goals: placement in [facility] Prior Treatments: dressing changes  Pain Score:   No signs of pain; slept during treatment  Wound Assessment  Pressure Injury 09/30/22 Hip Right Unstageable - Full thickness tissue loss in which the base of the injury is covered by slough (yellow, tan, gray, green or brown) and/or eschar (tan, brown or black) in the wound bed. 2 cms x 1 cm x 0.1 cms 80% red mois (Active)  Wound Image   11/04/22 1038  Dressing Type Foam - Lift dressing to assess site every shift;Barrier Film (skin prep);Gauze (Comment);Dakin's-soaked gauze 11/04/22 1038  Dressing Changed 11/04/22 1038  Dressing Change Frequency Daily 11/04/22 1038  State of Healing Eschar 11/04/22 1038  Site / Wound Assessment Yellow;Dusky;Granulation tissue 11/04/22 1038  % Wound base Red or Granulating 30% 11/04/22 1038  % Wound base Yellow/Fibrinous Exudate 70% 11/04/22 1038  % Wound base Black/Eschar 0% 11/04/22 1038  % Wound base Other/Granulation Tissue (Comment) 0% 10/31/22 1316  Peri-wound Assessment Pink 11/04/22 1038  Wound Length (cm) 3.2 cm 11/04/22 1038  Wound Width (cm) 2 cm 11/04/22 1038  Wound Depth (cm) 0.7 cm 11/04/22 1038  Wound Surface Area (cm^2) 6.4 cm^2 11/04/22 1038  Wound Volume (cm^3) 4.48 cm^3 11/04/22 1038  Tunneling (cm) 0 10/28/22 1433  Undermining (cm) 0 10/28/22 1433  Margins Unattached edges (unapproximated) 11/04/22 1038  Drainage Amount Scant 11/04/22 1038  Drainage Description Serous 11/04/22 1038  Treatment Cleansed 11/03/22 0620      Selective Debridement (non-excisional) Selective Debridement (non-excisional) - Location: R hip Selective Debridement  (non-excisional) - Tools Used: Forceps, Scissors Selective Debridement (non-excisional) - Tissue Removed: eschar    Wound Assessment and Plan  Wound Therapy - Assess/Plan/Recommendations Wound Therapy - Clinical Statement: Progressing with debridement of eschar. Pt tolerated well overall without complaints of pain. This patient will benefit from continued wound therapy for selective removal of unviable tissue, to decrease bioburden, and promote wound bed healing. Wound Therapy - Functional Problem List: pt unable to turn herself in the bed and due to cognitive decline is not able to understand need for offloading Factors Delaying/Impairing Wound Healing: Immobility, Incontinence, Multiple medical problems Hydrotherapy Plan: Debridement, Dressing change, Patient/family education Wound Therapy - Frequency:  (2x / week) Wound Therapy - Follow Up Recommendations: dressing changes by RN  Wound Therapy Goals- Improve the function of patient's integumentary system by progressing the wound(s) through the phases of wound healing (inflammation - proliferation - remodeling) by: Wound Therapy Goals - Improve the function of patient's integumentary system by progressing the wound(s) through the phases of wound healing by: Decrease Necrotic Tissue to: 20% Decrease Necrotic Tissue - Progress: Progressing toward goal Increase Granulation Tissue to: 80% Increase Granulation Tissue - Progress: Progressing toward goal Goals/treatment plan/discharge plan were made with and agreed upon by patient/family: No, Patient unable to participate in goals/treatment/discharge plan and family unavailable Time For Goal Achievement: 7 days Wound Therapy - Potential for Goals: Good  Goals will be updated until maximal potential achieved or discharge criteria met.  Discharge criteria: when goals achieved, discharge from hospital, MD decision/surgical intervention, no progress towards goals, refusal/missing three consecutive  treatments without notification or medical reason.  GP     Charges PT Wound Care Charges $Wound Debridement  up to 20 cm: < or equal to 20 cm $PT Hydrotherapy Dressing: 1 dressing $PT Hydrotherapy Visit: 1 Visit      [place]  Office [phone]   Rexanne Mano 11/04/2022, 10:44 AM

## 2022-11-04 NOTE — Progress Notes (Signed)
The NT was in with the patient and stated she began to blink rapidly and had a few twitches and then became unresponsive. No response to sternal rub. I opened her eyes her gaze was up and to the left. VSS.  CBG normal. Rapid response nurse called, at bedside to assess and feels she is just tired and doesn't wish to communicate at this time. MD aware.   Haydee Salter, RN

## 2022-11-04 NOTE — Progress Notes (Signed)
EEG complete - results pending 

## 2022-11-04 NOTE — Significant Event (Signed)
Rapid Response Event Note   Reason for Call :  NT noticed pt to blink rapidly and then became unresponsive  Initial Focused Assessment:  Pt lying in bed. Eyes open. Pt looking midline, slightly upward. PERRLA, 76m. Pt blinks to central threat. She does not blink bilaterally to peripheral threat. I am able to get patient to look towards me during my exam. She grimaces to painful stimuli. Face appears symmetrical. Upper arm movements are symmetrical. Pt is resistant to me lifting her arms. Legs appear contracted, however she withdrawals to painful stimuli.   Pt breathing is regular, unlabored. Skin is warm, dry, pink.   VS: BP 118/83, HR 92, RR 17, SpO2 100% on room air CBG: 148  Interventions:  CBG  Plan of Care:  -RN to follow-up with attending MD regarding change in patient status, no focal deficit noted on my exam  Call rapid response for additional needs  Event Summary:  MD Notified: Dr. GCruzita Lederer per RN Call Time: 1DonoraTime: 1550 End Time: 1[place] RN

## 2022-11-04 NOTE — Progress Notes (Signed)
PROGRESS NOTE  Donna Simmons [id number] DOB: May 02, 1941 DOA: 08/25/2022 PCP: [name], DO   LOS: 39 days   Brief Narrative / Interim history: 82 year old with past medical history significant for Alzheimer's dementia, hypertension, hyperlipidemia, permanent A-fib on Eliquis admitted from SNF with failure to thrive, hypernatremia with a sodium of 167, poor oral intake.  Workup revealed UTI ESBL.  Patient treated with IV Ertapenem.  Currently awaiting long-term placement.  She could not return to SNF because of unpaid bills, family is unable to take care of her at home, and Medicaid is pending.  Difficult to place  Subjective / 24h Interval events: She has no complaints.  Very confused  Assesement and Plan: Principal Problem:   Hypernatremia Active Problems:   GERD (gastroesophageal reflux disease)   Leukocytosis   Thrombocytosis   Hypokalemia   Hypoalbuminemia due to protein-calorie malnutrition (HCC)   Transaminitis   AKI (acute kidney injury) ([place])   Permanent atrial fibrillation (HCC)   Iron deficiency anemia   Alzheimer's dementia ([place])   Insomnia   Failure to thrive (child)   Principal problem UTI, E. coli ESBL -patient was diagnosed with a ESBL E. coli, speciated 08/30/2022.  Completed 5 days of ertapenem.  No further symptoms  Active problems Hypernatremia - Presented  with a sodium level of 167, likely in the setting of poor oral intake and hypovolemia. Resolved with IV fluids.  Sodium stable  AKI -resolved with fluids  Hypokalemia -continue to monitor and replace as indicated including this morning   Elevated LFTs-resolved.   Hypoalbuminemia - In the setting  of poor oral intake   Iron deficiency anemia -hemoglobin is stable   Permanent A-fib -continue Eliquis and flecainide   Alzheimer's dementia - Delirium precaution.    Disposition-Medicaid pending.  TOC following   Scheduled Meds:  apixaban  5 mg Oral BID   feeding supplement  237 mL  Oral TID BM   ferrous sulfate  325 mg Oral QODAY   flecainide  50 mg Oral BID   leptospermum manuka honey  1 Application Topical Daily   leptospermum manuka honey  1 Application Topical Daily   multivitamin with minerals  1 tablet Oral Daily   pantoprazole  40 mg Oral Daily   potassium chloride  40 mEq Oral Once   Continuous Infusions:   PRN Meds:.acetaminophen **OR** acetaminophen, lip balm, melatonin, ondansetron **OR** ondansetron (ZOFRAN) IV  Current Outpatient Medications  Medication Instructions   acetaminophen (TYLENOL) 650 mg, Oral, Every 6 hours PRN   apixaban (ELIQUIS) 5 MG TABS tablet Take 1 tablet by mouth twice daily   Cholecalciferol (VITAMIN D-3 PO) 1 capsule, Oral, Daily   Ensure (ENSURE) 237 mLs, Oral, 2 times daily, Vanilla   flecainide (TAMBOCOR) 50 MG tablet TAKE 1 TABLET BY MOUTH TWICE DAILY. APPOINTMENT REQUIRED FOR FUTURE REFILLS   melatonin 5 mg, Oral, At bedtime PRN   metoprolol succinate (TOPROL-XL) 12.5 mg, Oral, Daily   omeprazole (PRILOSEC) 40 mg, Oral, Daily   polyethylene glycol (MIRALAX / GLYCOLAX) 17 g, Oral, Daily PRN   traZODone (DESYREL) 50 mg, Oral, Daily at bedtime    Diet Orders (From admission, onward)     Start     Ordered   10/29/22 0752  DIET - DYS 1 Room service appropriate? Yes with Assist; Fluid consistency: Thin  Diet effective now       Question Answer Comment  Room service appropriate? Yes with Assist   Fluid consistency: Thin      10/29/22  [id number]            DVT prophylaxis: SCDs Start: 08/25/22 0659 apixaban (ELIQUIS) tablet 5 mg   Lab Results  Component Value Date   PLT 330 11/04/2022      Code Status: DNR  Family Communication: family at bedside   Status is: Inpatient  Remains inpatient appropriate because: difficult to place  Level of care: Med-Surg  Consultants:  none  Objective: Vitals:   11/03/22 0857 11/03/22 1549 11/03/22 2035 11/04/22 0803  BP: 108/60 (!) 99/53 110/62 112/80  Pulse: 69 62 76  79  Resp: '17 16 17 16  '$ Temp: 98 F (36.7 C) 98.6 F (37 C) 98.9 F (37.2 C) 98.1 F (36.7 C)  TempSrc: Oral Oral  Oral  SpO2: 99% 98% 99% 100%  Weight:      Height:        Intake/Output Summary (Last 24 hours) at 11/04/2022 0844 Last data filed at 11/03/2022 1133 Gross per 24 hour  Intake 1563.22 ml  Output --  Net 1563.22 ml   Wt Readings from Last 3 Encounters:  11/02/22 43 kg  07/03/22 50.2 kg  04/24/22 54.4 kg    Examination:  Constitutional: NAD Respiratory: CTA Cardiovascular: RRR  Data Reviewed: I have independently reviewed following labs and imaging studies   CBC Recent Labs  Lab 10/31/22 0617 11/01/22 0507 11/02/22 0449 11/03/22 0334 11/04/22 0316  WBC 3.4* 6.2 5.0 5.8 5.7  HGB 9.1* 9.4* 9.0* 9.0* 9.3*  HCT 31.1* 31.4* 30.2* 30.7* 30.1*  PLT 375 372 368 347 330  MCV 74.9* 74.1* 74.2* 75.6* 73.1*  MCH 21.9* 22.2* 22.1* 22.2* 22.6*  MCHC 29.3* 29.9* 29.8* 29.3* 30.9  RDW 20.0* 20.1* 20.3* 20.5* 20.6*  LYMPHSABS 1.1  --   --   --   --   MONOABS 0.3  --   --   --   --   EOSABS 0.0  --   --   --   --   BASOSABS 0.0  --   --   --   --      Recent Labs  Lab 10/31/22 0617 11/01/22 0507 11/02/22 0449 11/03/22 0334 11/04/22 0316  NA 141 141 139 139 137  K 3.4* 4.0 3.6 3.6 3.4*  CL 106 105 108 108 108  CO2 '28 25 24 24 25  '$ GLUCOSE 98 102* 89 103* 101*  BUN 17 24* '18 22 12  '$ CREATININE 0.56 0.60 0.54 0.56 0.53  CALCIUM 8.8* 8.5* 8.2* 8.3* 8.3*  AST  --  '30 22 20  '$ --   ALT  --  '27 22 19  '$ --   ALKPHOS  --  64 64 67  --   BILITOT  --  0.2* 0.5 0.3  --   ALBUMIN  --  2.2* 2.2* 2.1*  --   MG  --  2.1 2.0 1.9 1.9     ------------------------------------------------------------------------------------------------------------------ No results for input(s): "CHOL", "HDL", "LDLCALC", "TRIG", "CHOLHDL", "LDLDIRECT" in the last 72 hours.  Lab Results  Component Value Date   HGBA1C 5.3 04/04/2020    ------------------------------------------------------------------------------------------------------------------ No results for input(s): "TSH", "T4TOTAL", "T3FREE", "THYROIDAB" in the last 72 hours.  Invalid input(s): "FREET3"  Cardiac Enzymes No results for input(s): "CKMB", "TROPONINI", "MYOGLOBIN" in the last 168 hours.  Invalid input(s): "CK" ------------------------------------------------------------------------------------------------------------------    Component Value Date/Time   BNP 73.3 10/09/2019 1035    CBG: No results for input(s): "GLUCAP" in the last 168 hours.  No results found for this or any  previous visit (from the past 240 hour(s)).   Radiology Studies: No results found.   Marzetta Board, MD, PhD [facility]  Between 7 am - 7 pm I am available, please contact me via Amion (for emergencies) or Securechat (non urgent messages)  Between 7 pm - 7 am I am not available, please contact night coverage MD/APP via Amion

## 2022-11-05 DIAGNOSIS — R4182 Altered mental status, unspecified: Secondary | ICD-10-CM | POA: Diagnosis not present

## 2022-11-05 DIAGNOSIS — G309 Alzheimer's disease, unspecified: Secondary | ICD-10-CM | POA: Diagnosis not present

## 2022-11-05 DIAGNOSIS — E870 Hyperosmolality and hypernatremia: Secondary | ICD-10-CM | POA: Diagnosis not present

## 2022-11-05 DIAGNOSIS — R7309 Other abnormal glucose: Secondary | ICD-10-CM | POA: Diagnosis not present

## 2022-11-05 DIAGNOSIS — N179 Acute kidney failure, unspecified: Secondary | ICD-10-CM | POA: Diagnosis not present

## 2022-11-05 DIAGNOSIS — Z515 Encounter for palliative care: Secondary | ICD-10-CM | POA: Diagnosis not present

## 2022-11-05 DIAGNOSIS — Z7189 Other specified counseling: Secondary | ICD-10-CM

## 2022-11-05 DIAGNOSIS — R627 Adult failure to thrive: Secondary | ICD-10-CM | POA: Diagnosis not present

## 2022-11-05 NOTE — Procedures (Signed)
Patient Name: Donna Simmons  MRN: [id number]  Epilepsy Attending: Lora Havens  Referring Physician/Provider: Caren Griffins, MD  Date: 11/04/2022  Duration: 22.55 mins  Patient history: 82yo F with ams. EEG to evaluate for seizure.  Level of alertness: lethargic   AEDs during EEG study: None  Technical aspects: This EEG study was done with scalp electrodes positioned according to the 10-20 International system of electrode placement. Electrical activity was reviewed with band pass filter of 1-'70Hz'$ , sensitivity of 7 uV/mm, display speed of 79m/sec with a '60Hz'$  notched filter applied as appropriate. EEG data were recorded continuously and digitally stored.  Video monitoring was available and reviewed as appropriate.  Description: EEG showed continuous generalized 3 to 6 Hz theta-delta slowing. Hyperventilation and photic stimulation were not performed.     ABNORMALITY - Continuous slow, generalized  IMPRESSION: This study is suggestive of moderate diffuse encephalopathy, nonspecific etiology. No seizures or epileptiform discharges were seen throughout the recording.  [name] OBarbra Sarks

## 2022-11-05 NOTE — Progress Notes (Signed)
  Progress Note   Patient: Donna Simmons [id number] DOB: [birth year] DOA: 08/25/2022     71 DOS: the patient was seen and examined on 11/05/2022   Brief hospital course: 82 year old with past medical history significant for Alzheimer's dementia, hypertension, hyperlipidemia, permanent A-fib on Eliquis admitted from SNF with failure to thrive, hypernatremia with a sodium of 167, poor oral intake.  Workup revealed UTI ESBL.  Patient treated with IV Ertapenem.  Currently awaiting long-term placement.  She could not return to SNF because of unpaid bills, family is unable to take care of her at home, and Medicaid is pending.  Difficult to place   Assessment and Plan: Principal problem UTI, E. coli ESBL -patient was diagnosed with a ESBL E. coli, speciated 08/30/2022.  Completed 5 days of ertapenem.  No further symptoms   Active problems Hypernatremia - Presented  with a sodium level of 167, likely in the setting of poor oral intake and hypovolemia. Resolved with IV fluids.  Sodium stable   AKI -resolved with fluids   Hypokalemia -continue to monitor and replace as indicated including this morning   Elevated LFTs-resolved.   Hypoalbuminemia - In the setting  of poor oral intake   Iron deficiency anemia -hemoglobin is stable   Permanent A-fib -continue Eliquis and flecainide   Alzheimer's dementia - Delirium precaution.    Disposition-Medicaid pending.  TOC continues to follow      Subjective: Pleasantly confused  Physical Exam: Vitals:   11/05/22 0300 11/05/22 0500 11/05/22 0801 11/05/22 1544  BP: 104/76  (!) 91/55 111/80  Pulse: 98  98 80  Resp: '16  16 16  '$ Temp: 98.3 F (36.8 C)  98.4 F (36.9 C) 98 F (36.7 C)  TempSrc: Axillary  Oral Oral  SpO2: 98%  90% 98%  Weight:  43.5 kg    Height:       General exam: Awake, laying in bed, in nad Respiratory system: Normal respiratory effort, no wheezing Cardiovascular system: regular rate, s1, s2 Gastrointestinal system:  Soft, nondistended, positive BS Central nervous system: CN2-12 grossly intact, strength intact Extremities: Perfused, no clubbing Skin: Normal skin turgor, no notable skin lesions seen Psychiatry: Unable to assess given mentation  Data Reviewed:  There are no new results to review at this time.  Family Communication: Pt in room, family at bedside  Disposition: Status is: Inpatient Remains inpatient appropriate because: severity of illness and dispo planning  Planned Discharge Destination:  Unclear at this time    Author: Marylu Lund, MD 11/05/2022 5:03 PM  For on call review [email or web address].

## 2022-11-05 NOTE — Progress Notes (Signed)
Palliative Medicine Progress Note   Patient Name: Donna Simmons       Date: 11/05/2022 DOB: 1940/11/09  Age: 82 y.o. MRN#: [id number] Attending Physician: Donne Hazel, MD Primary Care Physician: [name], DO Admit Date: 08/25/2022  Reason for Consultation/Follow-up: Establishing goals of care  HPI/Patient Profile: 82 y.o. female  with past medical history of Alzheimer's dementia, A-fib on Eliquis, hypertension, and hyperlipidemia.  She presented to [facility] ED on 08/25/2022 from SNF with decreased PO intake and lethargy.  Sodium on admission was 166.  She was admitted to [facility] service with hypernatremia, dehydration, and failure to thrive.  Workup revealed UTI ESBL.  Patient is currently awaiting long-term placement.  She cannot return to SNF because of unpaid bills.  Her husband is unable to care for her at home.  Medicaid is pending.  Palliative Medicine was re-consulted 3/6 for goals of care in the setting of prolonged hospitalization.   Subjective: Chart reviewed. Per I&O flowsheet yesterday 3/5, patient ate 50% of breakfast and 25% of lunch.  Patient assessed at bedside. She is resting comfortably. She opens her eyes briefly to voice but quickly falls back asleep.   I spoke with husband/Thurmond by phone.  I reintroduced myself and the role of PMT.  We reviewed [facility] hospital course.  We also reviewed the diagnosis of dementia and natural trajectory.  Thurmond understands it is a progressive and noncurable illness.  We again reviewed specific indicators that Leilahni has reached advanced stages, including bedbound status, decreased ability to communicate, and decreased oral intake.  Discussed that although Larry's oral intake is not good, she is eating/drinking enough to sustain herself at  least for now. Gwynn Burly shares that his and Luisana's 47th wedding anniversary is approaching soon. He expresses that he is "saddened" by her current condition. Emotional support provided.   Objective:  Physical Exam Vitals reviewed.  Constitutional:      General: She is sleeping. She is not in acute distress.    Appearance: She is ill-appearing.     Comments: Frail  Psychiatric:        Speech: She is noncommunicative.        Cognition and Memory: Cognition is impaired.             Vital Signs: BP (!) 91/55 (BP Location: Left Arm)  Pulse 98   Temp 98.4 F (36.9 C) (Oral)   Resp 16   Ht '5\' 2"'$  (1.575 m)   Wt 43.5 kg   SpO2 90%   BMI 17.54 kg/m  SpO2: SpO2: 90 % O2 Device: O2 Device: Room Air  LBM: Last BM Date : 11/04/22     Palliative Assessment/Data: PPS 20-30%     Palliative Medicine Assessment & Plan   Assessment: Principal Problem:   Hypernatremia Active Problems:   GERD (gastroesophageal reflux disease)   Leukocytosis   Thrombocytosis   Hypokalemia   Hypoalbuminemia due to protein-calorie malnutrition (HCC)   Transaminitis   AKI (acute kidney injury) ([place])   Permanent atrial fibrillation (HCC)   Iron deficiency anemia   Alzheimer's dementia ([place])   Insomnia   Failure to thrive (adult)    Recommendations/Plan: Continue current supportive interventions Patient is eligible for home hospice services, but her husband is not able to care for her at home Patient does not seem to be eligible for residential hospice facility PMT will continue to follow   Code Status: DNR/DNI   Prognosis:  < 6 months would not be surprising    Thank you for allowing the Palliative Medicine Team to assist in the care of this patient.   MDM - High   Lavena Bullion, NP   Please contact Palliative Medicine Team phone at [phone] for questions and concerns.  For individual providers, please see AMION.

## 2022-11-05 NOTE — TOC Progression Note (Signed)
Transition of Care [facility]) - Progression Note    Patient Details  Name: Donna Simmons MRN: [id number] Date of Birth: Apr 10, 1941  Transition of Care [facility]) CM/SW [place], [place] Phone Number: 11/05/2022, 12:40 PM  Clinical Narrative:    CSW followed up with [facility], and was advised that their DON had declined. Have not received responses from [facility] or [facility], TOC will continue to follow.         Expected Discharge Plan and Services                                               Social Determinants of Health (SDOH) Interventions SDOH Screenings   Food Insecurity: Unknown (10/19/2022)  Alcohol Screen: Low Risk  (04/12/2019)  Depression (PHQ2-9): Low Risk  (11/05/2020)  Tobacco Use: Low Risk  (10/19/2022)    Readmission Risk Interventions     No data to display

## 2022-11-05 NOTE — Hospital Course (Addendum)
[name] is a 82 y.o. F with advanced Alzheimer's dementia, HTN, permAF on Eliquis, sent from rehab center with decreased responsiveness, found to have Na 167 and ESBL UTI.   Patient was treated with IV Ertapenem.She could not return to SNF because of unpaid bills, family is unable to take care of her at home.  She is unable to qualify for Medicaid currently for financial reasons.   Pt currently with wounds and poor oral intake. Usually non verbal, non mobile.CSW following closely, TOC has been discussing with patient and family, planning for [facility] hospice to follow-up at the facility-with plan to discharge to LTC facility. Significant events:12/25: Admitted from SNF on ertapenem, hypotonic fluids 1/3: Sodium corrected, completed Abx, medically ready but could not return to SNF due to unpaid bills 1/4-present:working with TOC to arrange safe disposition  On 4/13 patient developed sinus tachycardia and has been upto 120s 130 At this time blood pressure was soft and improved with fluids, heart rate is stable.  She appears more alert awake.  [facility]

## 2022-11-05 NOTE — Progress Notes (Signed)
Slept fairly well throughout latter half of shift. Interacting at baseline with staff during personal care and one to one interaction. EEG completed, results pending. Turned and repositioned throughout the night. Husband at bedside.

## 2022-11-06 DIAGNOSIS — R7309 Other abnormal glucose: Secondary | ICD-10-CM | POA: Diagnosis not present

## 2022-11-06 DIAGNOSIS — N179 Acute kidney failure, unspecified: Secondary | ICD-10-CM | POA: Diagnosis not present

## 2022-11-06 DIAGNOSIS — E870 Hyperosmolality and hypernatremia: Secondary | ICD-10-CM | POA: Diagnosis not present

## 2022-11-06 LAB — BASIC METABOLIC PANEL
Anion gap: 10 (ref 5–15)
BUN: 8 mg/dL (ref 8–23)
CO2: 26 mmol/L (ref 22–32)
Calcium: 8.5 mg/dL — ABNORMAL LOW (ref 8.9–10.3)
Chloride: 102 mmol/L (ref 98–111)
Creatinine, Ser: 0.46 mg/dL (ref 0.44–1.00)
GFR, Estimated: >60 mL/min (ref >60)
Glucose, Bld: 85 mg/dL (ref 70–99)
Potassium: 2.8 mmol/L — ABNORMAL LOW (ref 3.5–5.1)
Sodium: 138 mmol/L (ref 135–145)

## 2022-11-06 LAB — MAGNESIUM: Magnesium: 1.8 mg/dL (ref 1.7–2.4)

## 2022-11-06 MED ORDER — POTASSIUM CHLORIDE CRYS ER 20 MEQ PO TBCR
60.0000 meq | EXTENDED_RELEASE_TABLET | ORAL | Status: AC
  Administered 2022-11-06 (×2): 60 meq via ORAL
  Filled 2022-11-06 (×2): qty 3

## 2022-11-06 MED ORDER — MAGNESIUM SULFATE 2 GM/50ML IV SOLN
2.0000 g | Freq: Once | INTRAVENOUS | Status: AC
  Administered 2022-11-06: 2 g via INTRAVENOUS
  Filled 2022-11-06: qty 50

## 2022-11-06 NOTE — Progress Notes (Signed)
  Progress Note   Patient: Donna Simmons DOB: Dec 02, 1940 DOA: 08/25/2022     72 DOS: the patient was seen and examined on 11/06/2022   Brief hospital course: 82 year old with past medical history significant for Alzheimer's dementia, hypertension, hyperlipidemia, permanent A-fib on Eliquis admitted from SNF with failure to thrive, hypernatremia with a sodium of 167, poor oral intake.  Workup revealed UTI ESBL.  Patient treated with IV Ertapenem.  Currently awaiting long-term placement.  She could not return to SNF because of unpaid bills, family is unable to take care of her at home, and Medicaid is pending.  Difficult to place   Assessment and Plan: Principal problem UTI, E. coli ESBL -patient was diagnosed with a ESBL E. coli, speciated 08/30/2022.  Completed 5 days of ertapenem.  No further symptoms   Active problems Hypernatremia - Presented  with a sodium level of 167, likely in the setting of poor oral intake and hypovolemia. Resolved with IV fluids.  Sodium stable   AKI -resolved with fluids   Hypokalemia -low this AM, will replace  Hypomagnesemia - replace   Elevated LFTs-resolved.   Hypoalbuminemia - In the setting  of poor oral intake   Iron deficiency anemia -hemoglobin is stable   Permanent A-fib -continue Eliquis and flecainide   Alzheimer's dementia - Delirium precaution.    Disposition-Medicaid pending.  TOC continues to follow      Subjective: Remains pleasantly confused this AM  Physical Exam: Vitals:   11/05/22 2010 11/06/22 0419 11/06/22 0932 11/06/22 0933  BP: 109/70 115/63 102/70   Pulse: 87 77 (!) 119   Resp: 14  16   Temp: 97.8 F (36.6 C) 97.7 F (36.5 C)  97.9 F (36.6 C)  TempSrc: Axillary Axillary  Axillary  SpO2: 100% 95% 100%   Weight:      Height:       General exam: Conversant, in no acute distress Respiratory system: normal chest rise, clear, no audible wheezing Cardiovascular system: regular rhythm,  s1-s2 Gastrointestinal system: Nondistended, nontender, pos BS Central nervous system: No seizures, no tremors Extremities: No cyanosis, no joint deformities Skin: No rashes, no pallor Psychiatry: Affect normal // no auditory hallucinations   Data Reviewed:  Labs reviewed: Na 138, K 2.8, Cr 0.46  Family Communication: Pt in room, family at bedside  Disposition: Status is: Inpatient Remains inpatient appropriate because: severity of illness and dispo planning  Planned Discharge Destination:  Unclear at this time    Author: Marylu Lund, MD 11/06/2022 5:39 PM  For on call review [email or web address].

## 2022-11-07 DIAGNOSIS — R7309 Other abnormal glucose: Secondary | ICD-10-CM | POA: Diagnosis not present

## 2022-11-07 DIAGNOSIS — N179 Acute kidney failure, unspecified: Secondary | ICD-10-CM | POA: Diagnosis not present

## 2022-11-07 DIAGNOSIS — E870 Hyperosmolality and hypernatremia: Secondary | ICD-10-CM | POA: Diagnosis not present

## 2022-11-07 LAB — COMPREHENSIVE METABOLIC PANEL
ALT: 13 U/L (ref 0–44)
AST: 16 U/L (ref 15–41)
Albumin: 2.1 g/dL — ABNORMAL LOW (ref 3.5–5.0)
Alkaline Phosphatase: 56 U/L (ref 38–126)
Anion gap: 9 (ref 5–15)
BUN: 16 mg/dL (ref 8–23)
CO2: 25 mmol/L (ref 22–32)
Calcium: 8.3 mg/dL — ABNORMAL LOW (ref 8.9–10.3)
Chloride: 107 mmol/L (ref 98–111)
Creatinine, Ser: 0.51 mg/dL (ref 0.44–1.00)
GFR, Estimated: >60 mL/min (ref >60)
Glucose, Bld: 103 mg/dL — ABNORMAL HIGH (ref 70–99)
Potassium: 4.2 mmol/L (ref 3.5–5.1)
Sodium: 141 mmol/L (ref 135–145)
Total Bilirubin: 0.4 mg/dL (ref 0.3–1.2)
Total Protein: 6.1 g/dL — ABNORMAL LOW (ref 6.5–8.1)

## 2022-11-07 LAB — MAGNESIUM: Magnesium: 2.3 mg/dL (ref 1.7–2.4)

## 2022-11-07 NOTE — Progress Notes (Signed)
Physical Therapy Wound Treatment Patient Details  Name: Donna Simmons MRN: [id number] Date of Birth: 1941/02/01  Today's Date: 11/07/2022 Time: 0916-0958 Time Calculation (min): 42 min  Subjective  Subjective Assessment Subjective: moans and groans with rolling otherwise sleeping Patient and Family Stated Goals: placement in LTACH Prior Treatments: dressing changes  Pain Score:  Pt with facial pain of 4/10 with debridement, but quickly falls back to sleep after completed.  Wound Assessment     Pressure Injury 09/30/22 Hip Right Unstageable - Full thickness tissue loss in which the base of the injury is covered by slough (yellow, tan, gray, green or brown) and/or eschar (tan, brown or black) in the wound bed. 2 cms x 1 cm x 0.1 cms 80% red mois (Active)  Wound Image   11/04/22 1038  Dressing Type Foam - Lift dressing to assess site every shift;Barrier Film (skin prep);Gauze (Comment);Honey 11/07/22 1400  Dressing Changed 11/07/22 1400  Dressing Change Frequency Daily 11/07/22 1400  State of Healing Other (Comment) 11/07/22 1400  Site / Wound Assessment Yellow;Dusky;Granulation tissue 11/07/22 1400  % Wound base Red or Granulating 30% 11/07/22 1400  % Wound base Yellow/Fibrinous Exudate 70% 11/07/22 1400  % Wound base Black/Eschar 0% 11/04/22 1038  % Wound base Other/Granulation Tissue (Comment) 0% 10/31/22 1316  Peri-wound Assessment Pink 11/07/22 1400  Wound Length (cm) 3.2 cm 11/04/22 1038  Wound Width (cm) 2 cm 11/04/22 1038  Wound Depth (cm) 0.7 cm 11/04/22 1038  Wound Surface Area (cm^2) 6.4 cm^2 11/04/22 1038  Wound Volume (cm^3) 4.48 cm^3 11/04/22 1038  Tunneling (cm) 0 10/28/22 1433  Undermining (cm) 0 10/28/22 1433  Margins Unattached edges (unapproximated) 11/07/22 1400  Drainage Amount Scant 11/07/22 1400  Drainage Description Serous 11/07/22 1400  Treatment Debridement (Selective);Irrigation 11/07/22 1400      Selective Debridement (non-excisional) Selective  Debridement (non-excisional) - Location: R hip Selective Debridement (non-excisional) - Tools Used: Forceps, Scissors Selective Debridement (non-excisional) - Tissue Removed: eschar    Wound Assessment and Plan  Wound Therapy - Assess/Plan/Recommendations Wound Therapy - Clinical Statement: Eschar has been removed and underlying tissue is necrotic fat and yellow. softened slough. Pt with some discomfort during debridement but quickly falls back to sleep. Will continue to benefit from selective debridement of necrotic tissue to promote wound healing. Wound Therapy - Functional Problem List: unable to weightshift. lacks ability to understand wound treatment process Factors Delaying/Impairing Wound Healing: Immobility, Incontinence, Multiple medical problems Hydrotherapy Plan: Debridement, Dressing change, Patient/family education Wound Therapy - Frequency:  (2x / week) Wound Therapy - Follow Up Recommendations: dressing changes by RN  Wound Therapy Goals- Improve the function of patient's integumentary system by progressing the wound(s) through the phases of wound healing (inflammation - proliferation - remodeling) by: Wound Therapy Goals - Improve the function of patient's integumentary system by progressing the wound(s) through the phases of wound healing by: Decrease Necrotic Tissue to: 20% Decrease Necrotic Tissue - Progress: Progressing toward goal Increase Granulation Tissue to: 80% Increase Granulation Tissue - Progress: Progressing toward goal Goals/treatment plan/discharge plan were made with and agreed upon by patient/family: No, Patient unable to participate in goals/treatment/discharge plan and family unavailable Time For Goal Achievement: 7 days Wound Therapy - Potential for Goals: Good  Goals will be updated until maximal potential achieved or discharge criteria met.  Discharge criteria: when goals achieved, discharge from hospital, MD decision/surgical intervention, no progress  towards goals, refusal/missing three consecutive treatments without notification or medical reason.  GP     Charges PT  Wound Care Charges $Wound Debridement up to 20 cm: < or equal to 20 cm $ Wound Debridement each add'l 20 sqcm: 1 $PT Hydrotherapy Visit: 1 Visit      Maelie Chriswell B. Migdalia Dk PT, DPT Acute Rehabilitation Services Please use secure chat or  Call Office [phone]  [place] 11/07/2022, 4:01 PM

## 2022-11-07 NOTE — TOC Progression Note (Signed)
Transition of Care [facility]) - Progression Note    Patient Details  Name: Donna Simmons MRN: [id number] Date of Birth: 03-11-1941  Transition of Care [facility]) CM/SW [place], [place] Phone Number: 11/07/2022, 1:00 PM  Clinical Narrative:    Pt continues to have multiple barriers for placement. No updates available at this time. TOC will continue to follow for DC needs.         Expected Discharge Plan and Services                                               Social Determinants of Health (SDOH) Interventions SDOH Screenings   Food Insecurity: Unknown (10/19/2022)  Alcohol Screen: Low Risk  (04/12/2019)  Depression (PHQ2-9): Low Risk  (11/05/2020)  Tobacco Use: Low Risk  (10/19/2022)    Readmission Risk Interventions     No data to display

## 2022-11-07 NOTE — Progress Notes (Signed)
  Progress Note   Patient: Donna Simmons [id number] DOB: 08/29/1941 DOA: 08/25/2022     73 DOS: the patient was seen and examined on 11/07/2022   Brief hospital course: 82 year old with past medical history significant for Alzheimer's dementia, hypertension, hyperlipidemia, permanent A-fib on Eliquis admitted from SNF with failure to thrive, hypernatremia with a sodium of 167, poor oral intake.  Workup revealed UTI ESBL.  Patient treated with IV Ertapenem.  Currently awaiting long-term placement.  She could not return to SNF because of unpaid bills, family is unable to take care of her at home, and Medicaid is pending.  Difficult to place   Assessment and Plan: Principal problem UTI, E. coli ESBL -patient was diagnosed with a ESBL E. coli, speciated 08/30/2022.  Completed 5 days of ertapenem.  No further symptoms   Active problems Hypernatremia - Presented  with a sodium level of 167, likely in the setting of poor oral intake and hypovolemia. Resolved with IV fluids.  Sodium stable   AKI -resolved with fluids   Hypokalemia -replaced  Hypomagnesemia - replaced   Elevated LFTs-resolved.   Hypoalbuminemia - In the setting  of poor oral intake   Iron deficiency anemia -hemoglobin is stable   Permanent A-fib -continue Eliquis and flecainide   Alzheimer's dementia - Delirium precaution.    Disposition-Medicaid pending. TOC is following      Subjective: Without complaints  Physical Exam: Vitals:   11/06/22 2112 11/07/22 0500 11/07/22 0541 11/07/22 0911  BP: 93/62  (!) 102/57 (!) 98/57  Pulse: 98  85 78  Resp: 16  16 16   Temp: 98 F (36.7 C)  98.2 F (36.8 C) 97.6 F (36.4 C)  TempSrc: Axillary  Oral Oral  SpO2: 100%  100% 100%  Weight:  43.4 kg    Height:       General exam: Awake, laying in bed, in nad Respiratory system: Normal respiratory effort, no wheezing Cardiovascular system: regular rate, s1, s2 Gastrointestinal system: Soft, nondistended, positive  BS Central nervous system: CN2-12 grossly intact, strength intact Extremities: Perfused, no clubbing Skin: Normal skin turgor, no notable skin lesions seen Psychiatry: Mood normal // no visual hallucinations   Data Reviewed:  Labs reviewed: Na 141, K 4.2, Cr 0.51  Family Communication: Pt in room, family not at bedside  Disposition: Status is: Inpatient Remains inpatient appropriate because: severity of illness and dispo planning  Planned Discharge Destination:  Unclear at this time    Author: Marylu Lund, MD 11/07/2022 2:50 PM  For on call review [email or web address].

## 2022-11-08 DIAGNOSIS — E870 Hyperosmolality and hypernatremia: Secondary | ICD-10-CM | POA: Diagnosis not present

## 2022-11-08 LAB — COMPREHENSIVE METABOLIC PANEL
ALT: 14 U/L (ref 0–44)
AST: 18 U/L (ref 15–41)
Albumin: 2.1 g/dL — ABNORMAL LOW (ref 3.5–5.0)
Alkaline Phosphatase: 48 U/L (ref 38–126)
Anion gap: 10 (ref 5–15)
BUN: 12 mg/dL (ref 8–23)
CO2: 24 mmol/L (ref 22–32)
Calcium: 8.4 mg/dL — ABNORMAL LOW (ref 8.9–10.3)
Chloride: 105 mmol/L (ref 98–111)
Creatinine, Ser: 0.48 mg/dL (ref 0.44–1.00)
GFR, Estimated: >60 mL/min (ref >60)
Glucose, Bld: 84 mg/dL (ref 70–99)
Potassium: 3.9 mmol/L (ref 3.5–5.1)
Sodium: 139 mmol/L (ref 135–145)
Total Bilirubin: 0.6 mg/dL (ref 0.3–1.2)
Total Protein: 5.8 g/dL — ABNORMAL LOW (ref 6.5–8.1)

## 2022-11-08 LAB — MAGNESIUM: Magnesium: 2.1 mg/dL (ref 1.7–2.4)

## 2022-11-08 NOTE — Progress Notes (Signed)
  Progress Note   Patient: Donna Simmons [id number] DOB: 04-Dec-1940 DOA: 08/25/2022     74 DOS: the patient was seen and examined on 11/08/2022   Brief hospital course: 82 year old with past medical history significant for Alzheimer's dementia, hypertension, hyperlipidemia, permanent A-fib on Eliquis admitted from SNF with failure to thrive, hypernatremia with a sodium of 167, poor oral intake.  Workup revealed UTI ESBL.  Patient treated with IV Ertapenem.  Currently awaiting long-term placement.  She could not return to SNF because of unpaid bills, family is unable to take care of her at home, and Medicaid is pending.  Difficult to place   Assessment and Plan: Principal problem UTI, E. coli ESBL  -patient was diagnosed with a ESBL E. coli, speciated 08/30/2022.  Completed 5 days of ertapenem.  No further symptoms   Active problems Hypernatremia  - Presented  with a sodium level of 167, likely in the setting of poor oral intake and hypovolemia. Resolved with IV fluids.  Sodium stable   AKI -resolved with fluids   Hypokalemia -replaced  Hypomagnesemia - replaced   Elevated LFTs-resolved.   Hypoalbuminemia - In the setting  of poor oral intake   Iron deficiency anemia -hemoglobin is stable   Permanent A-fib -continue Eliquis and flecainide   Alzheimer's dementia - Delirium precaution.    Disposition-Medicaid pending. TOC continues to follow      Subjective: Pleasantly confused this AM  Physical Exam: Vitals:   11/07/22 2040 11/08/22 0622 11/08/22 0745 11/08/22 1535  BP: 111/73 106/72 97/67 118/69  Pulse: 78 72 75 76  Resp: 16 16 16 17   Temp: 98.3 F (36.8 C) 98 F (36.7 C) 98 F (36.7 C) 97.8 F (36.6 C)  TempSrc: Axillary  Axillary Oral  SpO2: 100% 100% 99% 99%  Weight:  43.5 kg    Height:       General exam: Conversant, in no acute distress Respiratory system: normal chest rise, clear, no audible wheezing Cardiovascular system: regular rhythm,  s1-s2 Gastrointestinal system: Nondistended, nontender, pos BS Central nervous system: No seizures, no tremors Extremities: No cyanosis, no joint deformities Skin: No rashes, no pallor Psychiatry: Affect normal // no auditory hallucinations   Data Reviewed:  Labs reviewed: Na 139, K 3.9, Cr 0.48  Family Communication: Pt in room, family at bedside  Disposition: Status is: Inpatient Remains inpatient appropriate because: severity of illness and dispo planning  Planned Discharge Destination:  Unclear at this time    Author: Marylu Lund, MD 11/08/2022 4:04 PM  For on call review [email or web address].

## 2022-11-08 NOTE — Plan of Care (Signed)

## 2022-11-09 DIAGNOSIS — R7309 Other abnormal glucose: Secondary | ICD-10-CM | POA: Diagnosis not present

## 2022-11-09 DIAGNOSIS — E870 Hyperosmolality and hypernatremia: Secondary | ICD-10-CM | POA: Diagnosis not present

## 2022-11-09 DIAGNOSIS — N179 Acute kidney failure, unspecified: Secondary | ICD-10-CM | POA: Diagnosis not present

## 2022-11-09 LAB — COMPREHENSIVE METABOLIC PANEL
ALT: 15 U/L (ref 0–44)
AST: 18 U/L (ref 15–41)
Albumin: 2.2 g/dL — ABNORMAL LOW (ref 3.5–5.0)
Alkaline Phosphatase: 60 U/L (ref 38–126)
Anion gap: 10 (ref 5–15)
BUN: 19 mg/dL (ref 8–23)
CO2: 24 mmol/L (ref 22–32)
Calcium: 8.6 mg/dL — ABNORMAL LOW (ref 8.9–10.3)
Chloride: 105 mmol/L (ref 98–111)
Creatinine, Ser: 0.6 mg/dL (ref 0.44–1.00)
GFR, Estimated: >60 mL/min (ref >60)
Glucose, Bld: 99 mg/dL (ref 70–99)
Potassium: 4.1 mmol/L (ref 3.5–5.1)
Sodium: 139 mmol/L (ref 135–145)
Total Bilirubin: 0.4 mg/dL (ref 0.3–1.2)
Total Protein: 6 g/dL — ABNORMAL LOW (ref 6.5–8.1)

## 2022-11-09 LAB — MAGNESIUM: Magnesium: 2 mg/dL (ref 1.7–2.4)

## 2022-11-09 NOTE — Progress Notes (Signed)
  Progress Note   Patient: Donna Simmons [id number] DOB: June 06, 1941 DOA: 08/25/2022     80 DOS: the patient was seen and examined on 11/09/2022   Brief hospital course: 82 year old with past medical history significant for Alzheimer's dementia, hypertension, hyperlipidemia, permanent A-fib on Eliquis admitted from SNF with failure to thrive, hypernatremia with a sodium of 167, poor oral intake.  Workup revealed UTI ESBL.  Patient treated with IV Ertapenem.  Currently awaiting long-term placement.  She could not return to SNF because of unpaid bills, family is unable to take care of her at home, and Medicaid is pending.  Difficult to place   Assessment and Plan: Principal problem UTI, E. coli ESBL  -patient was diagnosed with a ESBL E. coli, speciated 08/30/2022.  Completed 5 days of ertapenem.  No further symptoms   Active problems Hypernatremia  - Presented  with a sodium level of 167, likely in the setting of poor oral intake and hypovolemia. Resolved with IV fluids.  Sodium stable   AKI -resolved with fluids   Hypokalemia -replaced  Hypomagnesemia - replaced   Elevated LFTs-resolved.   Hypoalbuminemia - In the setting  of poor oral intake   Iron deficiency anemia -hemoglobin is stable   Permanent A-fib -continue Eliquis and flecainide   Alzheimer's dementia - Delirium precaution.    Disposition-Medicaid pending. TOC is following      Subjective: Remains pleasantly confused this AM  Physical Exam: Vitals:   11/08/22 1535 11/08/22 2025 11/09/22 0436 11/09/22 0750  BP: 118/69 100/68 96/76 110/68  Pulse: 76 87 82 87  Resp: 17 17 14 16   Temp: 97.8 F (36.6 C) 98 F (36.7 C) 97.9 F (36.6 C) 98.3 F (36.8 C)  TempSrc: Oral Oral  Oral  SpO2: 99% 100%  99%  Weight:      Height:       General exam: Awake, laying in bed, in nad Respiratory system: Normal respiratory effort, no wheezing Cardiovascular system: regular rate, s1, s2 Gastrointestinal system: Soft,  nondistended, positive BS Central nervous system: CN2-12 grossly intact, strength intact Extremities: Perfused, no clubbing Skin: Normal skin turgor, no notable skin lesions seen Psychiatry: Mood normal // no visual hallucinations   Data Reviewed:  Labs reviewed: Na 139, K 4.1, Cr 0.60  Family Communication: Pt in room, family at bedside  Disposition: Status is: Inpatient Remains inpatient appropriate because: severity of illness and dispo planning  Planned Discharge Destination:  Unclear at this time    Author: Marylu Lund, MD 11/09/2022 5:20 PM  For on call review [email or web address].

## 2022-11-09 NOTE — Plan of Care (Signed)

## 2022-11-10 DIAGNOSIS — R7309 Other abnormal glucose: Secondary | ICD-10-CM | POA: Diagnosis not present

## 2022-11-10 DIAGNOSIS — E876 Hypokalemia: Secondary | ICD-10-CM | POA: Diagnosis not present

## 2022-11-10 DIAGNOSIS — E870 Hyperosmolality and hypernatremia: Secondary | ICD-10-CM | POA: Diagnosis not present

## 2022-11-10 LAB — COMPREHENSIVE METABOLIC PANEL
ALT: 16 U/L (ref 0–44)
AST: 20 U/L (ref 15–41)
Albumin: 2.2 g/dL — ABNORMAL LOW (ref 3.5–5.0)
Alkaline Phosphatase: 54 U/L (ref 38–126)
Anion gap: 3 — ABNORMAL LOW (ref 5–15)
BUN: 13 mg/dL (ref 8–23)
CO2: 30 mmol/L (ref 22–32)
Calcium: 8.7 mg/dL — ABNORMAL LOW (ref 8.9–10.3)
Chloride: 104 mmol/L (ref 98–111)
Creatinine, Ser: 0.61 mg/dL (ref 0.44–1.00)
GFR, Estimated: >60 mL/min (ref >60)
Glucose, Bld: 104 mg/dL — ABNORMAL HIGH (ref 70–99)
Potassium: 3.8 mmol/L (ref 3.5–5.1)
Sodium: 137 mmol/L (ref 135–145)
Total Bilirubin: 0.3 mg/dL (ref 0.3–1.2)
Total Protein: 6.1 g/dL — ABNORMAL LOW (ref 6.5–8.1)

## 2022-11-10 LAB — MAGNESIUM: Magnesium: 2 mg/dL (ref 1.7–2.4)

## 2022-11-10 MED ORDER — POTASSIUM CHLORIDE CRYS ER 20 MEQ PO TBCR
40.0000 meq | EXTENDED_RELEASE_TABLET | Freq: Once | ORAL | Status: AC
  Administered 2022-11-10: 40 meq via ORAL
  Filled 2022-11-10: qty 2

## 2022-11-10 NOTE — Plan of Care (Signed)

## 2022-11-10 NOTE — TOC Progression Note (Signed)
Transition of Care [facility]) - Progression Note    Patient Details  Name: Donna Simmons MRN: [id number] Date of Birth: 1941-02-27  Transition of Care [facility]) CM/SW [place], [place] Phone Number: 11/10/2022, 2:55 PM  Clinical Narrative:     CSW reviewed pt's chart and received report from nurses. Per nursing staff, pt has been more communicative, eating better but still not moving much. CSW noting no bed offers have been extended at this time, no updates from Medicaid yet. TOC will continue to follow for placement needs.       Expected Discharge Plan and Services                                               Social Determinants of Health (SDOH) Interventions SDOH Screenings   Food Insecurity: Unknown (10/19/2022)  Alcohol Screen: Low Risk  (04/12/2019)  Depression (PHQ2-9): Low Risk  (11/05/2020)  Tobacco Use: Low Risk  (10/19/2022)    Readmission Risk Interventions     No data to display

## 2022-11-10 NOTE — Consult Note (Addendum)
[facility] Nurse wound follow up Wound type: pressure  Measurement:  Sacrum Stage 4 Pressure Injury 3 cms x 2 cms x 2 cms with 3 cms undermining from 12 o'clock to 9 o'clock, 60% pink moist 40% fibrinous tissue,  epibole of wound edges noted  R hip Unstageable Pressure Injury 3 cms x 3 cms x 2 cms 50% tan yellow slough 50% pink moist  R medial foot (base of great toe) Stage 4 Pressure Injury 2 cms x 1 cm x 0.1 cm 100% pink moist (small area of bone/cartilage superior aspect of wound)  R great toe former area of Deep Tissue Injury that is now Stage 2 Pressure Injury 0.5 cm x 0.5 cm x 0.1 100% pink and moist  L lateral foot Stage 2 Pressure Injury 1 cm x 1 cm x 0.1 cm 100% pink and moist   Drainage (amount, consistency, odor) minimal serosanguinous from all wounds, no malodor noted  Periwound:scarring noted to sacrum, all others intact  Dressing procedure/placement/frequency:  R hip receiving hydrotherapy, continue Medihoney to wound bed daily.  R medial foot, R great toe and L lateral foot clean with NS, continue with Silver Hydrofiber daily Kellie Simmering [phone]).  Sacrum Stage 4 clean with NS, pack wound with Silver Hydrofiber daily using a Q tip applicator to fill entire wound bed including undermining with Silver, cover with dry gauze and foam.  May lift foam to change Silver daily.  Change foam dressings q3 days and prn soiling.    Patient remains on low air loss mattress for moisture management and pressure redistribution.  Prevalon boots in place to offload pressure points (patient is contracted).    WOC will continue to follow patient weekly.   Thanks,    [employer] MSN, RN-BC, [employer]

## 2022-11-10 NOTE — Progress Notes (Signed)
  Progress Note   Patient: Donna Simmons [id number] DOB: Oct 05, 1940 DOA: 08/25/2022     80 DOS: the patient was seen and examined on 11/10/2022   Brief hospital course: 82 year old with past medical history significant for Alzheimer's dementia, hypertension, hyperlipidemia, permanent A-fib on Eliquis admitted from SNF with failure to thrive, hypernatremia with a sodium of 167, poor oral intake.  Workup revealed UTI ESBL.  Patient treated with IV Ertapenem.  Currently awaiting long-term placement.  She could not return to SNF because of unpaid bills, family is unable to take care of her at home, and Medicaid is pending.  Difficult to place   Assessment and Plan: Principal problem UTI, E. coli ESBL  -patient was diagnosed with a ESBL E. coli, speciated 08/30/2022.  Completed 5 days of ertapenem.  No further symptoms   Active problems Hypernatremia  - Presented  with a sodium level of 167, likely in the setting of poor oral intake and hypovolemia. Resolved with IV fluids.  Sodium stable   AKI -resolved with fluids   Hypokalemia -replaced  Hypomagnesemia - replaced   Elevated LFTs-resolved.   Hypoalbuminemia - In the setting  of poor oral intake   Iron deficiency anemia -hemoglobin is stable   Permanent A-fib -continue Eliquis and flecainide   Alzheimer's dementia - Delirium precaution.    Disposition-Medicaid pending. TOC continues to follow      Subjective: Confused but pleasant this AM  Physical Exam: Vitals:   11/10/22 0422 11/10/22 0500 11/10/22 0700 11/10/22 1455  BP: 100/60  (!) 93/58 120/67  Pulse: 84  83 83  Resp: 16  16 16   Temp: 97.6 F (36.4 C)  98.3 F (36.8 C) 98 F (36.7 C)  TempSrc:    Oral  SpO2: 100%  (!) 87% 100%  Weight:  43.2 kg    Height:       General exam: Conversant, in no acute distress Respiratory system: normal chest rise, clear, no audible wheezing Cardiovascular system: regular rhythm, s1-s2 Gastrointestinal system: Nondistended,  nontender, pos BS Central nervous system: No seizures, no tremors Extremities: No cyanosis, no joint deformities Skin: No rashes, no pallor Psychiatry: Affect normal // no auditory hallucinations   Data Reviewed:  Labs reviewed: Na 137, K 3.8, Cr 0.61  Family Communication: Pt in room, family currently not at bedside  Disposition: Status is: Inpatient Remains inpatient appropriate because: severity of illness and dispo planning  Planned Discharge Destination:  Unclear at this time    Author: Marylu Lund, MD 11/10/2022 5:31 PM  For on call review [email or web address].

## 2022-11-11 DIAGNOSIS — N179 Acute kidney failure, unspecified: Secondary | ICD-10-CM | POA: Diagnosis not present

## 2022-11-11 DIAGNOSIS — E870 Hyperosmolality and hypernatremia: Secondary | ICD-10-CM | POA: Diagnosis not present

## 2022-11-11 DIAGNOSIS — R7309 Other abnormal glucose: Secondary | ICD-10-CM | POA: Diagnosis not present

## 2022-11-11 LAB — COMPREHENSIVE METABOLIC PANEL
ALT: 14 U/L (ref 0–44)
AST: 19 U/L (ref 15–41)
Albumin: 2.2 g/dL — ABNORMAL LOW (ref 3.5–5.0)
Alkaline Phosphatase: 53 U/L (ref 38–126)
Anion gap: 8 (ref 5–15)
BUN: 13 mg/dL (ref 8–23)
CO2: 25 mmol/L (ref 22–32)
Calcium: 8.7 mg/dL — ABNORMAL LOW (ref 8.9–10.3)
Chloride: 105 mmol/L (ref 98–111)
Creatinine, Ser: 0.56 mg/dL (ref 0.44–1.00)
GFR, Estimated: >60 mL/min (ref >60)
Glucose, Bld: 90 mg/dL (ref 70–99)
Potassium: 3.7 mmol/L (ref 3.5–5.1)
Sodium: 138 mmol/L (ref 135–145)
Total Bilirubin: <0.1 mg/dL — ABNORMAL LOW (ref 0.3–1.2)
Total Protein: 6.2 g/dL — ABNORMAL LOW (ref 6.5–8.1)

## 2022-11-11 MED ORDER — POTASSIUM CHLORIDE CRYS ER 20 MEQ PO TBCR
60.0000 meq | EXTENDED_RELEASE_TABLET | Freq: Once | ORAL | Status: AC
  Administered 2022-11-11: 60 meq via ORAL
  Filled 2022-11-11: qty 3

## 2022-11-11 NOTE — Progress Notes (Signed)
  Progress Note   Patient: Donna Simmons [id number] DOB: November 28, 1940 DOA: 08/25/2022     77 DOS: the patient was seen and examined on 11/11/2022   Brief hospital course: 82 year old with past medical history significant for Alzheimer's dementia, hypertension, hyperlipidemia, permanent A-fib on Eliquis admitted from SNF with failure to thrive, hypernatremia with a sodium of 167, poor oral intake.  Workup revealed UTI ESBL.  Patient treated with IV Ertapenem.  Currently awaiting long-term placement.  She could not return to SNF because of unpaid bills, family is unable to take care of her at home, and Medicaid is pending.  Difficult to place   Assessment and Plan: Principal problem UTI, E. coli ESBL  -patient was diagnosed with a ESBL E. coli, speciated 08/30/2022.  Completed 5 days of ertapenem.  No further symptoms   Active problems Hypernatremia  - Presented  with a sodium level of 167, likely in the setting of poor oral intake and hypovolemia. Resolved with IV fluids.  Sodium remains stable   AKI -resolved with fluids   Hypokalemia - historically, has required supplementation periodically. Given 20meq today. Will recheck bmet in AM  Hypomagnesemia - replaced   Elevated LFTs-resolved.   Hypoalbuminemia - In the setting  of poor oral intake   Iron deficiency anemia -hemoglobin is stable   Permanent A-fib -continue Eliquis and flecainide   Alzheimer's dementia - Delirium precaution.    Disposition-Medicaid pending. TOC continues to follow      Subjective: Pleasantly confused this AM  Physical Exam: Vitals:   11/10/22 1455 11/10/22 2133 11/11/22 0500 11/11/22 0654  BP: 120/67 92/60  (!) 98/59  Pulse: 83 86  84  Resp: 16 16  16   Temp: 98 F (36.7 C) 98.2 F (36.8 C)  97.6 F (36.4 C)  TempSrc: Oral Axillary  Oral  SpO2: 100% 100%  100%  Weight:   43.2 kg   Height:       General exam: Awake, laying in bed, in nad Respiratory system: Normal respiratory effort, no  wheezing Cardiovascular system: regular rate, s1, s2 Gastrointestinal system: Soft, nondistended, positive BS Central nervous system: CN2-12 grossly intact, strength intact Extremities: Perfused, no clubbing Skin: Normal skin turgor, no notable skin lesions seen Psychiatry: Mood normal // no visual hallucinations   Data Reviewed:  Labs reviewed: Na 138, K 3.7, Cr 0.56  Family Communication: Pt in room, family currently at bedside  Disposition: Status is: Inpatient Remains inpatient appropriate because: severity of illness and dispo planning  Planned Discharge Destination:  Unclear at this time    Author: Marylu Lund, MD 11/11/2022 6:06 PM  For on call review [email or web address].

## 2022-11-11 NOTE — TOC Progression Note (Signed)
Transition of Care [facility]) - Progression Note    Patient Details  Name: CLINIQUE SCHAPPERT MRN: [id number] Date of Birth: 15-Jan-1941  Transition of Care [facility]) CM/SW [place], [place] Phone Number: 11/11/2022, 1:07 PM  Clinical Narrative:    CSW attempted to follow up with additional facilities to see if they can review pt. Still no answer from [facility]. [facility] will return CSW's call when able. TOC will continue to follow for DC needs.         Expected Discharge Plan and Services                                               Social Determinants of Health (SDOH) Interventions SDOH Screenings   Food Insecurity: Unknown (10/19/2022)  Alcohol Screen: Low Risk  (04/12/2019)  Depression (PHQ2-9): Low Risk  (11/05/2020)  Tobacco Use: Low Risk  (10/19/2022)    Readmission Risk Interventions     No data to display

## 2022-11-11 NOTE — Progress Notes (Signed)
Physical Therapy Wound Treatment Patient Details  Name: Donna Simmons MRN: [id number] Date of Birth: 1940/09/07  Today's Date: 11/11/2022 Time: 0832-0902 Time Calculation (min): 30 min  Subjective  Subjective Assessment Subjective: no verbalizations Patient and Family Stated Goals: placement in [facility] Prior Treatments: dressing changes  Pain Score:   no moaning or verbalizations indicative of pain  Wound Assessment  Wound Image   11/11/22 0904  Dressing Type Foam - Lift dressing to assess site every shift;Gauze (Comment);Honey;Barrier Film (skin prep) 11/11/22 0904  Dressing Changed 11/11/22 0904  Dressing Change Frequency Every 3 days 11/11/22 0904  State of Healing Early/partial granulation 11/11/22 0904  Site / Wound Assessment Yellow 11/11/22 0904  % Wound base Red or Granulating 60% 11/11/22 0904  % Wound base Yellow/Fibrinous Exudate 40% 11/11/22 0904  % Wound base Black/Eschar 0% 11/11/22 0904  % Wound base Other/Granulation Tissue (Comment) 0% 11/11/22 0904  Peri-wound Assessment Pink 11/11/22 0904  Wound Length (cm) 3.3 cm 11/11/22 0904  Wound Width (cm) 2.3 cm 11/11/22 0904  Wound Depth (cm) 1 cm 11/11/22 0904  Wound Surface Area (cm^2) 7.59 cm^2 11/11/22 0904  Wound Volume (cm^3) 7.59 cm^3 11/11/22 0904  Tunneling (cm) 0 10/28/22 1433  Undermining (cm) 0 10/28/22 1433  Margins Unattached edges (unapproximated) 11/11/22 0904  Drainage Amount Moderate 11/11/22 0904  Drainage Description Serous 11/11/22 0904  Treatment Debridement (Selective);Irrigation;Packing (Dry gauze) 11/11/22 0904      Selective Debridement (non-excisional) Selective Debridement (non-excisional) - Location: R hip Selective Debridement (non-excisional) - Tools Used: Forceps, Scalpel Selective Debridement (non-excisional) - Tissue Removed: necrotic slough    Wound Assessment and Plan  Wound Therapy - Assess/Plan/Recommendations Wound Therapy - Clinical Statement: Continue to remove yellow  necrotic tissue and slough. Increased drainage noted, however remains serous. Will continue to benefit from debridement to promote healing. Wound Therapy - Functional Problem List: unable to weightshift. lacks ability to understand wound treatment process Factors Delaying/Impairing Wound Healing: Immobility, Incontinence, Multiple medical problems Hydrotherapy Plan: Debridement, Dressing change, Patient/family education Wound Therapy - Frequency: Other (comment) (2x/week) Wound Therapy - Follow Up Recommendations: f/u selective debridement, dressing changes by RN  Wound Therapy Goals- Improve the function of patient's integumentary system by progressing the wound(s) through the phases of wound healing (inflammation - proliferation - remodeling) by: Wound Therapy Goals - Improve the function of patient's integumentary system by progressing the wound(s) through the phases of wound healing by: Decrease Necrotic Tissue to: 20% Decrease Necrotic Tissue - Progress: Progressing toward goal Increase Granulation Tissue to: 80% Increase Granulation Tissue - Progress: Progressing toward goal Goals/treatment plan/discharge plan were made with and agreed upon by patient/family: No, Patient unable to participate in goals/treatment/discharge plan and family unavailable Time For Goal Achievement: 7 days Wound Therapy - Potential for Goals: Good  Goals will be updated until maximal potential achieved or discharge criteria met.  Discharge criteria: when goals achieved, discharge from hospital, MD decision/surgical intervention, no progress towards goals, refusal/missing three consecutive treatments without notification or medical reason.  GP     Charges PT Wound Care Charges $Wound Debridement up to 20 cm: < or equal to 20 cm $PT Hydrotherapy Dressing: 1 dressing $PT Hydrotherapy Visit: 1 Visit      Arby Barrette, PT Acute Rehabilitation Services  Office [phone]   Rexanne Mano 11/11/2022, 9:11  AM

## 2022-11-12 DIAGNOSIS — E870 Hyperosmolality and hypernatremia: Secondary | ICD-10-CM | POA: Diagnosis not present

## 2022-11-12 LAB — CBC
HCT: 30.3 % — ABNORMAL LOW (ref 36.0–46.0)
Hemoglobin: 9.3 g/dL — ABNORMAL LOW (ref 12.0–15.0)
MCH: 22.2 pg — ABNORMAL LOW (ref 26.0–34.0)
MCHC: 30.7 g/dL (ref 30.0–36.0)
MCV: 72.5 fL — ABNORMAL LOW (ref 80.0–100.0)
Platelets: 382 10*3/uL (ref 150–400)
RBC: 4.18 MIL/uL (ref 3.87–5.11)
RDW: 20.3 % — ABNORMAL HIGH (ref 11.5–15.5)
WBC: 5.6 10*3/uL (ref 4.0–10.5)
nRBC: 0 % (ref 0.0–0.2)

## 2022-11-12 LAB — COMPREHENSIVE METABOLIC PANEL
ALT: 20 U/L (ref 0–44)
AST: 19 U/L (ref 15–41)
Albumin: 2.2 g/dL — ABNORMAL LOW (ref 3.5–5.0)
Alkaline Phosphatase: 67 U/L (ref 38–126)
Anion gap: 7 (ref 5–15)
BUN: 23 mg/dL (ref 8–23)
CO2: 26 mmol/L (ref 22–32)
Calcium: 8.7 mg/dL — ABNORMAL LOW (ref 8.9–10.3)
Chloride: 107 mmol/L (ref 98–111)
Creatinine, Ser: 0.64 mg/dL (ref 0.44–1.00)
GFR, Estimated: >60 mL/min (ref >60)
Glucose, Bld: 105 mg/dL — ABNORMAL HIGH (ref 70–99)
Potassium: 4.2 mmol/L (ref 3.5–5.1)
Sodium: 140 mmol/L (ref 135–145)
Total Bilirubin: 0.3 mg/dL (ref 0.3–1.2)
Total Protein: 6.3 g/dL — ABNORMAL LOW (ref 6.5–8.1)

## 2022-11-12 LAB — MAGNESIUM: Magnesium: 1.9 mg/dL (ref 1.7–2.4)

## 2022-11-12 NOTE — Progress Notes (Signed)
  Progress Note   Patient: Donna Simmons [id number] DOB: Nov 14, 1940 DOA: 08/25/2022     78 DOS: the patient was seen and examined on 11/12/2022   Brief hospital course: 82 year old with past medical history significant for Alzheimer's dementia, hypertension, hyperlipidemia, permanent A-fib on Eliquis admitted from SNF with failure to thrive, hypernatremia with a sodium of 167, poor oral intake.  Workup revealed UTI ESBL.  Patient treated with IV Ertapenem.  Currently awaiting long-term placement.  She could not return to SNF because of unpaid bills, family is unable to take care of her at home, and Medicaid is pending.  Difficult to place   Assessment and Plan: Principal problem UTI, E. coli ESBL  -patient was diagnosed with a ESBL E. coli, speciated 08/30/2022.  Completed 5 days of ertapenem.  No further symptoms   Active problems Hypernatremia  - Presented  with a sodium level of 167, likely in the setting of poor oral intake and hypovolemia. Resolved with IV fluids.  Sodium remains stable   AKI -resolved with fluids   Hypokalemia - historically, has required supplementation periodically. Given 82meq today. Will recheck bmet in AM  Hypomagnesemia - replaced   Elevated LFTs-resolved.   Hypoalbuminemia - In the setting  of poor oral intake   Iron deficiency anemia -hemoglobin is stable   Permanent A-fib -continue Eliquis and flecainide   Alzheimer's dementia - Delirium precaution.    Disposition-Medicaid pending. TOC continues to follow      Subjective:  No acute event ordered,  Pleasantly confused , she is calm and cooperative  Physical Exam: Vitals:   11/11/22 1959 11/12/22 0430 11/12/22 0900 11/12/22 1638  BP: (!) 149/115 113/64 111/66 (!) 88/63  Pulse: 94 91 91 97  Resp: 18 18 18 17   Temp: 97.7 F (36.5 C) 98.4 F (36.9 C) 98.1 F (36.7 C) 98.6 F (37 C)  TempSrc: Oral Oral Oral Axillary  SpO2: 94% 98%  99%  Weight:      Height:       General exam:  demented, Awake, laying in bed, in nad, + mittens, currently on agitation Respiratory system: Normal respiratory effort, no wheezing Cardiovascular system: regular rate, s1, s2 Gastrointestinal system: Soft, nondistended, positive BS Central nervous system: CN2-12 grossly intact, appear to have chronic contracture in lower extremity Extremities: Perfused, no clubbing Skin: Normal skin turgor, no notable skin lesions seen Psychiatry: demented, appear calm and cooperative currently   Data Reviewed:  Labs reviewed: Na 138, K 3.7, Cr 0.56  Family Communication: family is not at bedside  Disposition: Remains inpatient appropriate because: dispo planning  Planned Discharge Destination:  Unclear at this time    Author: Florencia Reasons, MD PhD FACP 11/12/2022 7:19 PM  For on call review [email or web address].

## 2022-11-13 DIAGNOSIS — E870 Hyperosmolality and hypernatremia: Secondary | ICD-10-CM | POA: Diagnosis not present

## 2022-11-13 NOTE — Progress Notes (Signed)
  Progress Note   Patient: Donna Simmons [id number] DOB: 1941-05-02 DOA: 08/25/2022     79 DOS: the patient was seen and examined on 11/13/2022   Brief hospital course: 82 year old with past medical history significant for Alzheimer's dementia, hypertension, hyperlipidemia, permanent A-fib on Eliquis admitted from SNF with failure to thrive, hypernatremia with a sodium of 167, poor oral intake.  Workup revealed UTI ESBL.  Patient treated with IV Ertapenem.  Currently awaiting long-term placement.  She could not return to SNF because of unpaid bills, family is unable to take care of her at home, and Medicaid is pending.  Difficult to place   Assessment and Plan: Principal problem UTI, E. coli ESBL  -patient was diagnosed with a ESBL E. coli, speciated 08/30/2022.  Completed 5 days of ertapenem.  No further symptoms   Active problems Hypernatremia  - Presented  with a sodium level of 167, likely in the setting of poor oral intake and hypovolemia. Resolved with IV fluids.  Sodium remains stable   AKI -resolved with fluids   Hypokalemia - historically, has required supplementation periodically. Given 45meq today. Will recheck bmet in AM  Hypomagnesemia - replaced   Elevated LFTs-resolved.   Hypoalbuminemia - In the setting  of poor oral intake   Iron deficiency anemia -hemoglobin is stable   Permanent A-fib -continue Eliquis and flecainide   Alzheimer's dementia - Delirium precaution.    Disposition-Medicaid pending. TOC continues to follow      Subjective:  No acute event ordered,  Pleasantly confused , she is calm and cooperative  Physical Exam: Vitals:   11/12/22 1638 11/12/22 1931 11/13/22 0428 11/13/22 0807  BP: (!) 88/63 105/72 (!) 143/66 139/71  Pulse: 97 97 98 92  Resp: 17 16 15 17   Temp: 98.6 F (37 C) 97.9 F (36.6 C) 98.2 F (36.8 C) 98.6 F (37 C)  TempSrc: Axillary Axillary Axillary Axillary  SpO2: 99% 98% 92% 93%  Weight:      Height:        General exam: demented, Awake, laying in bed, in nad, + mittens, currently on agitation Respiratory system: Normal respiratory effort, no wheezing Cardiovascular system: regular rate, s1, s2 Gastrointestinal system: Soft, nondistended, positive BS Central nervous system: CN2-12 grossly intact, appear to have chronic contracture in lower extremity Extremities: Perfused, no clubbing Skin: Normal skin turgor, no notable skin lesions seen Psychiatry: demented, appear calm and cooperative currently   Data Reviewed:  Labs reviewed: Na 138, K 3.7, Cr 0.56  Family Communication: family is not at bedside  Disposition: Remains inpatient appropriate because: dispo planning  Planned Discharge Destination:  Unclear at this time    Author: Florencia Reasons, MD PhD FACP 11/13/2022 6:48 PM  For on call review [email or web address].

## 2022-11-13 NOTE — TOC Progression Note (Addendum)
Transition of Care [facility]) - Progression Note    Patient Details  Name: Donna Simmons MRN: [id number] Date of Birth: Oct 23, 1940  Transition [facility]) CM/SW Contact  Coralee [place], [place] Phone Number: 11/13/2022, 3:22 PM  Clinical Narrative:    CSW notified that [facility] may have some LTC beds available. They agreed to review pt, but were ultimately unable to accept. [facility] does not have a LTC bed available at this time. CSW emailed DSS worker to request an update on [place] application. No response from [facility] yet. TOC is continuing to follow.         Expected Discharge Plan and Services                                               Social Determinants of Health (SDOH) Interventions SDOH Screenings   Food Insecurity: Patient Declined (10/19/2022)  Alcohol Screen: Low Risk  (04/12/2019)  Depression (PHQ2-9): Low Risk  (11/05/2020)  Tobacco Use: Low Risk  (10/19/2022)    Readmission Risk Interventions     No data to display

## 2022-11-14 DIAGNOSIS — E870 Hyperosmolality and hypernatremia: Secondary | ICD-10-CM | POA: Diagnosis not present

## 2022-11-14 NOTE — Progress Notes (Signed)
Physical Therapy Wound Treatment Patient Details  Name: Donna Simmons MRN: [id number] Date of Birth: May 28, 1941  Today's Date: 11/14/2022 Time: [id number] Time Calculation (min): 23 min  Subjective  Subjective Assessment Subjective: Pt sleeping through most of session. At end of session pt woke up and was pleasant, says thank you. Patient and Family Stated Goals: placement in [facility] Prior Treatments: dressing changes  Pain Score:  Pt sleeping throughout treatment. Did not appear to be in pain.   Wound Assessment  Pressure Injury 09/30/22 Hip Right Unstageable - Full thickness tissue loss in which the base of the injury is covered by slough (yellow, tan, gray, green or brown) and/or eschar (tan, brown or black) in the wound bed. 2 cms x 1 cm x 0.1 cms 80% red mois (Active)  Wound Image    11/14/22 1333  Dressing Type Foam - Lift dressing to assess site every shift;Gauze (Comment);Honey;Barrier Film (skin prep) 11/14/22 1333  Dressing Changed 11/14/22 1333  Dressing Change Frequency Daily 11/14/22 1333  State of Healing Early/partial granulation 11/14/22 1333  Site / Wound Assessment Yellow;Red 11/14/22 1333  % Wound base Red or Granulating 70% 11/14/22 1333  % Wound base Yellow/Fibrinous Exudate 30% 11/14/22 1333  % Wound base Black/Eschar 0% 11/14/22 1333  % Wound base Other/Granulation Tissue (Comment) 0% 11/14/22 1333  Peri-wound Assessment Pink 11/14/22 1333  Wound Length (cm) 3 cm 11/14/22 1333  Wound Width (cm) 2 cm 11/14/22 1333  Wound Depth (cm) 1.1 cm 11/14/22 1333  Wound Surface Area (cm^2) 6 cm^2 11/14/22 1333  Wound Volume (cm^3) 6.6 cm^3 11/14/22 1333  Tunneling (cm) 0 11/14/22 1333  Undermining (cm) 0 11/14/22 1333  Margins Unattached edges (unapproximated) 11/14/22 1333  Drainage Amount Minimal 11/14/22 1333  Drainage Description Serosanguineous 11/14/22 1333  Treatment Debridement (Selective);Irrigation;Packing (Dry gauze) 11/14/22 1333      Selective  Debridement (non-excisional) Selective Debridement (non-excisional) - Location: R hip Selective Debridement (non-excisional) - Tools Used: Forceps, Scalpel Selective Debridement (non-excisional) - Tissue Removed: necrotic slough    Wound Assessment and Plan  Wound Therapy - Assess/Plan/Recommendations Wound Therapy - Clinical Statement: Initially wound appeared to be closing, however when exploring wound for measurements, wound opened back up, revealing more necrotic slough under the surface. Progressing with debridement. Pt will benefit from continued wound therapy for selective removal of unviable tissue, to decrease bioburden, and promote wound bed healing. Wound Therapy - Functional Problem List: unable to weightshift. lacks ability to understand wound treatment process Factors Delaying/Impairing Wound Healing: Immobility, Incontinence, Multiple medical problems Hydrotherapy Plan: Debridement, Dressing change, Patient/family education Wound Therapy - Frequency: Other (comment) (2x/week) Wound Therapy - Follow Up Recommendations: f/u selective debridement, dressing changes by RN  Wound Therapy Goals- Improve the function of patient's integumentary system by progressing the wound(s) through the phases of wound healing (inflammation - proliferation - remodeling) by: Wound Therapy Goals - Improve the function of patient's integumentary system by progressing the wound(s) through the phases of wound healing by: Decrease Necrotic Tissue to: 20% Decrease Necrotic Tissue - Progress: Progressing toward goal Increase Granulation Tissue to: 80% Increase Granulation Tissue - Progress: Progressing toward goal Goals/treatment plan/discharge plan were made with and agreed upon by patient/family: No, Patient unable to participate in goals/treatment/discharge plan and family unavailable Time For Goal Achievement: 7 days Wound Therapy - Potential for Goals: Good  Goals will be updated until maximal  potential achieved or discharge criteria met.  Discharge criteria: when goals achieved, discharge from hospital, MD decision/surgical intervention, no progress towards goals,  refusal/missing three consecutive treatments without notification or medical reason.  GP     Charges PT Wound Care Charges $Wound Debridement up to 20 cm: < or equal to 20 cm $PT Hydrotherapy Dressing: 1 dressing $PT Hydrotherapy Visit: 1 Visit       Thelma Comp 11/14/2022, 1:44 PM  Rolinda Roan, PT, DPT Acute Rehabilitation Services Secure Chat Preferred Office: [phone]

## 2022-11-14 NOTE — Progress Notes (Signed)
  Progress Note   Patient: Donna Simmons [id number] DOB: [birth year] DOA: 08/25/2022     82 DOS: the patient was seen and examined on 11/14/2022   Brief hospital course: 82 year old with past medical history significant for Alzheimer's dementia, hypertension, hyperlipidemia, permanent A-fib on Eliquis admitted from SNF with failure to thrive, hypernatremia with a sodium of 167, poor oral intake.  Workup revealed UTI ESBL.  Patient treated with IV Ertapenem.  Currently awaiting long-term placement.  She could not return to SNF because of unpaid bills, family is unable to take care of her at home, and Medicaid is pending.  Difficult to place   Assessment and Plan: Principal problem UTI, E. coli ESBL  -patient was diagnosed with a ESBL E. coli, speciated 08/30/2022.  Completed 5 days of ertapenem.  No further symptoms   Active problems Hypernatremia  - Presented  with a sodium level of 167, likely in the setting of poor oral intake and hypovolemia. Resolved with IV fluids.  Sodium remains stable   AKI -resolved with fluids   Hypokalemia - historically, has required supplementation periodically. Given 36meq today. Will recheck bmet in AM  Hypomagnesemia - replaced   Elevated LFTs-resolved.   Hypoalbuminemia - In the setting  of poor oral intake   Iron deficiency anemia -hemoglobin is stable   Permanent A-fib -continue Eliquis and flecainide   Alzheimer's dementia - Delirium precaution.    Disposition-Medicaid pending. TOC continues to follow      Pressure injury right hip, getting hydrotherapy  Subjective:  No acute event ordered,  Pleasantly confused , she is calm and cooperative Mittens on  Physical Exam: Vitals:   11/14/22 0425 11/14/22 0500 11/14/22 0747 11/14/22 1541  BP: 95/69  99/67 101/71  Pulse: 98  (!) 53 82  Resp:   16 16  Temp: 98.3 F (36.8 C)  98.1 F (36.7 C) 98.2 F (36.8 C)  TempSrc: Oral  Oral   SpO2:   90% 100%  Weight:  43.1 kg    Height:        General exam: demented, Awake, laying in bed, in nad, + mittens, currently on agitation Respiratory system: Normal respiratory effort, no wheezing Cardiovascular system: regular rate, s1, s2 Gastrointestinal system: Soft, nondistended, positive BS Central nervous system: CN2-12 grossly intact, appear to have chronic contracture in lower extremity Extremities: Perfused, no clubbing Skin: Normal skin turgor, no notable skin lesions seen Psychiatry: demented, appear calm and cooperative currently   Data Reviewed:  Labs reviewed: Na 138, K 3.7, Cr 0.56  Family Communication: family is not at bedside  Disposition: Remains inpatient appropriate because: dispo planning  Planned Discharge Destination:  Unclear at this time    Author: Florencia Reasons, MD PhD FACP 11/14/2022 6:56 PM  For on call review [email or web address].

## 2022-11-15 DIAGNOSIS — E870 Hyperosmolality and hypernatremia: Secondary | ICD-10-CM | POA: Diagnosis not present

## 2022-11-15 LAB — URINALYSIS, ROUTINE W REFLEX MICROSCOPIC
Bilirubin Urine: NEGATIVE
Glucose, UA: NEGATIVE mg/dL
Ketones, ur: NEGATIVE mg/dL
Nitrite: POSITIVE — AB
Protein, ur: NEGATIVE mg/dL
Specific Gravity, Urine: 1.026 (ref 1.005–1.030)
WBC, UA: >50 WBC/hpf (ref 0–5)
pH: 5 (ref 5.0–8.0)

## 2022-11-15 MED ORDER — SODIUM CHLORIDE 0.9 % IV BOLUS
1000.0000 mL | Freq: Once | INTRAVENOUS | Status: AC
  Administered 2022-11-15: 1000 mL via INTRAVENOUS

## 2022-11-15 NOTE — Progress Notes (Signed)
  Progress Note   Patient: Donna Simmons [id number] DOB: [birth year] DOA: 08/25/2022     83 DOS: the patient was seen and examined on 11/15/2022   Brief hospital course: 82 year old with past medical history significant for Alzheimer's dementia, hypertension, hyperlipidemia, permanent A-fib on Eliquis admitted from SNF with failure to thrive, hypernatremia with a sodium of 167, poor oral intake.  Workup revealed UTI ESBL.  Patient treated with IV Ertapenem.  Currently awaiting long-term placement.  She could not return to SNF because of unpaid bills, family is unable to take care of her at home, and Medicaid is pending.  Difficult to place   Assessment and Plan: Principal problem UTI, E. coli ESBL  -patient was diagnosed with a ESBL E. coli, speciated 08/30/2022.  Completed 5 days of ertapenem.    3/16 RN reports her urine is odorous , recheck ua with flex, check cbc/bmp   Active problems Hypernatremia  - Presented  with a sodium level of 167, likely in the setting of poor oral intake and hypovolemia. Resolved with IV fluids.  Sodium remains stable   AKI -resolved with fluids   Hypokalemia - historically, has required supplementation periodically. Given 45meq today. Will recheck bmet in AM  Hypomagnesemia - replaced   Elevated LFTs-resolved.   Hypoalbuminemia - In the setting  of poor oral intake   Iron deficiency anemia -hemoglobin is stable   Permanent A-fib -continue Eliquis and flecainide   Alzheimer's dementia - Delirium precaution.    Disposition-Medicaid pending. TOC continues to follow      Pressure injury right hip, getting hydrotherapy  Subjective:  Total care, Pleasantly confused , she is calm and cooperative, Mittens on  RN Reports her urine is odorous, also  reports she is very contracted, external urinary catheter leaks , think temporary foley will be helpful  Physical Exam: Vitals:   11/15/22 0500 11/15/22 0818 11/15/22 1003 11/15/22 1536  BP:  (!)  92/47 (!) 90/57 111/68  Pulse:  94 90 70  Resp:  16  17  Temp:  98.1 F (36.7 C)  98 F (36.7 C)  TempSrc:  Oral  Oral  SpO2:  99%  100%  Weight: 43 kg     Height:       General exam: demented, Awake, laying in bed, in nad, + mittens, currently on agitation Respiratory system: Normal respiratory effort, no wheezing Cardiovascular system: regular rate, s1, s2 Gastrointestinal system: Soft, nondistended, positive BS Central nervous system: CN2-12 grossly intact, appear to have chronic contracture in lower extremity Extremities: Perfused, no clubbing Skin: Normal skin turgor, no notable skin lesions seen Psychiatry: demented, appear calm and cooperative currently   Data Reviewed:  Labs reviewed: Na 138, K 3.7, Cr 0.56  Family Communication: family is not at bedside  Disposition: Remains inpatient appropriate because: dispo planning  Planned Discharge Destination:  Unclear at this time    Author: Florencia Reasons, MD PhD FACP 11/15/2022 7:07 PM  For on call review [email or web address].

## 2022-11-16 DIAGNOSIS — E870 Hyperosmolality and hypernatremia: Secondary | ICD-10-CM | POA: Diagnosis not present

## 2022-11-16 LAB — CBC WITH DIFFERENTIAL/PLATELET
Abs Immature Granulocytes: 0.01 10*3/uL (ref 0.00–0.07)
Basophils Absolute: 0 10*3/uL (ref 0.0–0.1)
Basophils Relative: 0 %
Eosinophils Absolute: 0 10*3/uL (ref 0.0–0.5)
Eosinophils Relative: 0 %
HCT: 31.4 % — ABNORMAL LOW (ref 36.0–46.0)
Hemoglobin: 9.3 g/dL — ABNORMAL LOW (ref 12.0–15.0)
Immature Granulocytes: 0 %
Lymphocytes Relative: 21 %
Lymphs Abs: 1.1 10*3/uL (ref 0.7–4.0)
MCH: 21.9 pg — ABNORMAL LOW (ref 26.0–34.0)
MCHC: 29.6 g/dL — ABNORMAL LOW (ref 30.0–36.0)
MCV: 74.1 fL — ABNORMAL LOW (ref 80.0–100.0)
Monocytes Absolute: 0.4 10*3/uL (ref 0.1–1.0)
Monocytes Relative: 7 %
Neutro Abs: 3.8 10*3/uL (ref 1.7–7.7)
Neutrophils Relative %: 72 %
Platelets: 368 10*3/uL (ref 150–400)
RBC: 4.24 MIL/uL (ref 3.87–5.11)
RDW: 21 % — ABNORMAL HIGH (ref 11.5–15.5)
WBC: 5.3 10*3/uL (ref 4.0–10.5)
nRBC: 0 % (ref 0.0–0.2)

## 2022-11-16 LAB — BASIC METABOLIC PANEL
Anion gap: 7 (ref 5–15)
BUN: 25 mg/dL — ABNORMAL HIGH (ref 8–23)
CO2: 25 mmol/L (ref 22–32)
Calcium: 8.5 mg/dL — ABNORMAL LOW (ref 8.9–10.3)
Chloride: 109 mmol/L (ref 98–111)
Creatinine, Ser: 0.48 mg/dL (ref 0.44–1.00)
GFR, Estimated: >60 mL/min (ref >60)
Glucose, Bld: 68 mg/dL — ABNORMAL LOW (ref 70–99)
Potassium: 3.8 mmol/L (ref 3.5–5.1)
Sodium: 141 mmol/L (ref 135–145)

## 2022-11-16 LAB — PHOSPHORUS: Phosphorus: 2.6 mg/dL (ref 2.5–4.6)

## 2022-11-16 LAB — MAGNESIUM: Magnesium: 2 mg/dL (ref 1.7–2.4)

## 2022-11-16 LAB — GLUCOSE, CAPILLARY: Glucose-Capillary: 124 mg/dL — ABNORMAL HIGH (ref 70–99)

## 2022-11-16 MED ORDER — DEXTROSE IN LACTATED RINGERS 5 % IV SOLN: INTRAVENOUS | Status: AC

## 2022-11-16 MED ORDER — CHLORHEXIDINE GLUCONATE CLOTH 2 % EX PADS
6.0000 | MEDICATED_PAD | Freq: Every day | CUTANEOUS | Status: DC
  Administered 2022-11-16 – 2022-12-19 (×34): 6 via TOPICAL

## 2022-11-16 NOTE — Progress Notes (Addendum)
  Progress Note   Patient: Donna Simmons [id number] DOB: 06-Oct-1940 DOA: 08/25/2022     82 DOS: the patient was seen and examined on 11/16/2022   Brief hospital course: 82 year old with past medical history significant for Alzheimer's dementia, hypertension, hyperlipidemia, permanent A-fib on Eliquis admitted from SNF with failure to thrive, hypernatremia with a sodium of 167, poor oral intake.  Workup revealed UTI ESBL.  Patient treated with IV Ertapenem.  Currently awaiting long-term placement.  She could not return to SNF because of unpaid bills, family is unable to take care of her at home, and Medicaid is pending.  Difficult to place   Assessment and Plan:  Principal problem UTI, E. coli ESBL  -patient was diagnosed with a ESBL E. coli, speciated 08/30/2022.  Completed 5 days of ertapenem.    3/16 RN reports her urine is odorous , recheck ua with flex, check cbc/bmp Foley was placed on 3/16 as RN reports external urinary catheter does not work on her due to her severe contraction, foley inserted to assist on would healing on hip for which she is getting hydrotherapy   3/17 She is total care ,appeared to have poor oral intake, BP low normal , blood glucose 68 this morning ,start gentle hydration, monitor urine output.   Active problems Hypernatremia  - Presented  with a sodium level of 167, likely in the setting of poor oral intake and hypovolemia. Resolved with IV fluids.  Sodium remains stable   AKI -resolved with fluids   Hypokalemia - historically, has required supplementation periodically. Given 31meq today. Will recheck bmet in AM  Hypomagnesemia - replaced   Elevated LFTs-resolved.   Hypoalbuminemia - In the setting  of poor oral intake   Iron deficiency anemia -hemoglobin is stable   Permanent A-fib -continue Eliquis and flecainide   Alzheimer's dementia - Delirium precaution.    Disposition-Medicaid pending. TOC continues to follow      Pressure injury  right hip, getting hydrotherapy  Subjective:  Total care, Pleasantly confused , she is calm and cooperative, Mittens on  Husband at bedside    Physical Exam: Vitals:   11/15/22 2051 11/16/22 0420 11/16/22 0700 11/16/22 1548  BP: 109/68 (!) 103/56 103/60 90/63  Pulse: 78 81 85 83  Resp: 16  16 16   Temp: 98.6 F (37 C) 98.8 F (37.1 C) (!) 97.5 F (36.4 C) 98.5 F (36.9 C)  TempSrc: Axillary Axillary Axillary Axillary  SpO2: 100% 100% 100% 100%  Weight:      Height:       General exam: demented, Awake, laying in bed, in nad, + mittens, currently on agitation Respiratory system: Normal respiratory effort, no wheezing Cardiovascular system: regular rate, s1, s2 Gastrointestinal system: Soft, nondistended, positive BS Central nervous system: CN2-12 grossly intact, appear to have chronic contracture in lower extremity Extremities: Perfused, no clubbing Skin: Normal skin turgor, no notable skin lesions seen Psychiatry: demented, appear calm and cooperative currently   Data Reviewed:  Labs reviewed: Na 138, K 3.7, Cr 0.56  Family Communication: husband at  bedside on 3/17  Disposition: Remains inpatient appropriate because: dispo planning  Planned Discharge Destination:  Unclear at this time    Author: Florencia Reasons, MD PhD FACP 11/16/2022 6:28 PM  For on call review [email or web address].

## 2022-11-16 NOTE — Plan of Care (Signed)

## 2022-11-16 NOTE — Plan of Care (Signed)
°  Problem: Education: °Goal: Knowledge of General Education information will improve °Description: Including pain rating scale, medication(s)/side effects and non-pharmacologic comfort measures °Outcome: Not Progressing °  °Problem: Health Behavior/Discharge Planning: °Goal: Ability to manage health-related needs will improve °Outcome: Not Progressing °  °Problem: Clinical Measurements: °Goal: Ability to maintain clinical measurements within normal limits will improve °Outcome: Progressing °  °

## 2022-11-17 DIAGNOSIS — E870 Hyperosmolality and hypernatremia: Secondary | ICD-10-CM | POA: Diagnosis not present

## 2022-11-17 LAB — CBC WITH DIFFERENTIAL/PLATELET
Abs Immature Granulocytes: 0.04 10*3/uL (ref 0.00–0.07)
Basophils Absolute: 0 10*3/uL (ref 0.0–0.1)
Basophils Relative: 0 %
Eosinophils Absolute: 0 10*3/uL (ref 0.0–0.5)
Eosinophils Relative: 0 %
HCT: 31.8 % — ABNORMAL LOW (ref 36.0–46.0)
Hemoglobin: 9.6 g/dL — ABNORMAL LOW (ref 12.0–15.0)
Immature Granulocytes: 1 %
Lymphocytes Relative: 13 %
Lymphs Abs: 0.9 10*3/uL (ref 0.7–4.0)
MCH: 22.2 pg — ABNORMAL LOW (ref 26.0–34.0)
MCHC: 30.2 g/dL (ref 30.0–36.0)
MCV: 73.4 fL — ABNORMAL LOW (ref 80.0–100.0)
Monocytes Absolute: 0.4 10*3/uL (ref 0.1–1.0)
Monocytes Relative: 6 %
Neutro Abs: 6 10*3/uL (ref 1.7–7.7)
Neutrophils Relative %: 80 %
Platelets: 337 10*3/uL (ref 150–400)
RBC: 4.33 MIL/uL (ref 3.87–5.11)
RDW: 20.8 % — ABNORMAL HIGH (ref 11.5–15.5)
WBC: 7.4 10*3/uL (ref 4.0–10.5)
nRBC: 0 % (ref 0.0–0.2)

## 2022-11-17 LAB — BASIC METABOLIC PANEL
Anion gap: 8 (ref 5–15)
BUN: 19 mg/dL (ref 8–23)
CO2: 22 mmol/L (ref 22–32)
Calcium: 8.5 mg/dL — ABNORMAL LOW (ref 8.9–10.3)
Chloride: 108 mmol/L (ref 98–111)
Creatinine, Ser: 0.48 mg/dL (ref 0.44–1.00)
GFR, Estimated: >60 mL/min (ref >60)
Glucose, Bld: 100 mg/dL — ABNORMAL HIGH (ref 70–99)
Potassium: 3.7 mmol/L (ref 3.5–5.1)
Sodium: 138 mmol/L (ref 135–145)

## 2022-11-17 LAB — IRON AND TIBC
Iron: 18 ug/dL — ABNORMAL LOW (ref 28–170)
Saturation Ratios: 7 % — ABNORMAL LOW (ref 10.4–31.8)
TIBC: 256 ug/dL (ref 250–450)
UIBC: 238 ug/dL

## 2022-11-17 MED ORDER — MEDIHONEY WOUND/BURN DRESSING EX PSTE
1.0000 | PASTE | Freq: Every day | CUTANEOUS | Status: AC
  Administered 2022-11-17 – 2022-11-30 (×13): 1 via TOPICAL
  Filled 2022-11-17: qty 44

## 2022-11-17 NOTE — Progress Notes (Signed)
PROGRESS NOTE  Donna Simmons  DOB: 1941/08/23  PCP: [name], DO [id number]  DOA: 08/25/2022  LOS: 52 days  Hospital Day: 75  Brief narrative: Donna Simmons is a 82 y.o. female with PMH significant for Alzheimer's dementia, HTN, HLD, permanent A-fib on Eliquis, GERD. 12/25, patient was sent to ED from [facility] for poor oral intake, increased lethargy, failure to thrive. Workup showed hypernatremia of 167, AKI.  Urine culture grew ESBL E. coli. Patient completed the course of IV ertapenem.  She could not return to SNF because of unpaid bills.  Family was unable to take care of her at home.  Medicaid pending. Difficult to place.  Subjective: Patient was seen and examined this afternoon.  Elderly African-American female.  Demented.  Unable to interact.  No family at bedside  Assessment and plan: ESBL E. coli UTI Completed 5 days course of ertapenem  Failure to thrive Hypoalbuminemia Patient has poor oral intake, severe contracture, hip woounds 3/16, Foley catheter inserted to promote wound healing on hip for which she is getting hydrotherapy.  An external urinary catheter is not an option after given her severe contraction.  Hypokalemia Improved with replacement.  Check intermittently. Recent Labs  Lab 11/11/22 0111 11/12/22 0045 11/16/22 0252 11/17/22 0727  K 3.7 4.2 3.8 3.7  MG  --  1.9 2.0  --   PHOS  --   --  2.6  --    Hypernatremia Presented with sodium of 167 due to poor p.o. intake Started on hypotonic fluid.  Sodium level subsequently improved.   Recent Labs  Lab 11/11/22 0111 11/12/22 0045 11/16/22 0252 11/17/22 0727  NA 138 140 141 138   Acute kidney injury Resolved.  Recent Labs    11/04/22 0316 11/06/22 0335 11/07/22 0330 11/08/22 0342 11/09/22 0429 11/10/22 0240 11/11/22 0111 11/12/22 0045 11/16/22 0252 11/17/22 0727  BUN 12 8 16 12 19 13 13 23  25* 19  CREATININE 0.53 0.46 0.51 0.48 0.60 0.61 0.56 0.64 0.48 0.48  CO2  25 26 25 24 24 30 25 26 25 22    Chronic iron deficiency anemia Hemoglobin low but stable.  Continue iron supplement. Recent Labs    11/03/22 0334 11/04/22 0316 11/12/22 0045 11/16/22 0252 11/17/22 0727  HGB 9.0* 9.3* 9.3* 9.3* 9.6*  MCV 75.6* 73.1* 72.5* 74.1* 73.4*  TIBC  --   --   --   --  256  IRON  --   --   --   --  18*    Permanent atrial fibrillation Continue flecainide, apixaban Metoprolol on hold due to soft blood pressure   Alzheimer's dementia Continue delirium precautions and supportive care. Melatonin for insomnia    Mobility: Bedbound status  Goals of care   Code Status: DNR     DVT prophylaxis:  SCDs Start: 08/25/22 0659 apixaban (ELIQUIS) tablet 5 mg   Antimicrobials: None Fluid: None Consultants: None Family Communication: None at bedside.  Husband visits intermittently  Status: Inpatient Level of care:  Med-Surg   Needs to continue in-hospital care:  Difficult to place because of Medicaid pending status  Prognosis:  Poor  Patient from: Home   Scheduled Meds:  apixaban  5 mg Oral BID   Chlorhexidine Gluconate Cloth  6 each Topical Daily   feeding supplement  237 mL Oral TID BM   ferrous sulfate  325 mg Oral QODAY   flecainide  50 mg Oral BID   leptospermum manuka honey  1 Application Topical Daily  multivitamin with minerals  1 tablet Oral Daily   pantoprazole  40 mg Oral Daily    PRN meds: acetaminophen **OR** acetaminophen, lip balm, melatonin, ondansetron **OR** ondansetron (ZOFRAN) IV   Infusions:    Diet:  Diet Order             DIET - DYS 1 Room service appropriate? Yes with Assist; Fluid consistency: Thin  Diet effective now                   Antimicrobials: Anti-infectives (From admission, onward)    Start     Dose/Rate Route Frequency Ordered Stop   09/03/22 1000  ertapenem (INVANZ) 1,000 mg in sodium chloride 0.9 % 100 mL IVPB        1 g 200 mL/hr over 30 Minutes Intravenous Every 24 hours 09/02/22  1421 09/04/22 2048   08/30/22 1500  ertapenem (INVANZ) 1,000 mg in sodium chloride 0.9 % 100 mL IVPB  Status:  Discontinued        1 g 200 mL/hr over 30 Minutes Intravenous Every 24 hours 08/30/22 1419 09/02/22 1421   08/29/22 1115  cefTRIAXone (ROCEPHIN) 1 g in sodium chloride 0.9 % 100 mL IVPB  Status:  Discontinued        1 g 200 mL/hr over 30 Minutes Intravenous Every 24 hours 08/29/22 1017 08/30/22 1419       Skin assessment:  Pressure Injury 09/04/22 Sacrum Stage 4 - Full thickness tissue loss with exposed bone, tendon or muscle. 3 cms x 3 cms x 1 cm (Active)  09/04/22 1030  Location: Sacrum  Location Orientation:   Staging: Stage 4 - Full thickness tissue loss with exposed bone, tendon or muscle.  Wound Description (Comments): 3 cms x 3 cms x 1 cm  Present on Admission: No     Pressure Injury 09/30/22 Hip Right Unstageable - Full thickness tissue loss in which the base of the injury is covered by slough (yellow, tan, gray, green or brown) and/or eschar (tan, brown or black) in the wound bed. 2 cms x 1 cm x 0.1 cms 80% red mois (Active)  09/30/22 1200  Location: Hip  Location Orientation: Right  Staging: Unstageable - Full thickness tissue loss in which the base of the injury is covered by slough (yellow, tan, gray, green or brown) and/or eschar (tan, brown or black) in the wound bed.  Wound Description (Comments): 2 cms x 1 cm x 0.1 cms 80% red moist 20% yellow  Present on Admission: No     Pressure Injury 10/20/22 Labia Left Stage 2 -  Partial thickness loss of dermis presenting as a shallow open injury with a red, pink wound bed without slough. Abrasion on left labia (Active)  10/20/22 2017  Location: Labia  Location Orientation: Left  Staging: Stage 2 -  Partial thickness loss of dermis presenting as a shallow open injury with a red, pink wound bed without slough.  Wound Description (Comments): Abrasion on left labia  Present on Admission: No      Nutritional status:   Body mass index is 17.34 kg/m.  Nutrition Problem: Severe Malnutrition Etiology: chronic illness Signs/Symptoms: severe muscle depletion, severe fat depletion     Objective: Vitals:   11/17/22 0446 11/17/22 0800  BP: 104/64 96/83  Pulse: 76 87  Resp: 16 15  Temp: 99.3 F (37.4 C) 98.3 F (36.8 C)  SpO2: 99% 99%    Intake/Output Summary (Last 24 hours) at 11/17/2022 1635 Last data filed at 11/17/2022  [id number] Gross per 24 hour  Intake 560 ml  Output 625 ml  Net -65 ml   Filed Weights   11/11/22 0500 11/14/22 0500 11/15/22 0500  Weight: 43.2 kg 43.1 kg 43 kg   Weight change:  Body mass index is 17.34 kg/m.   Physical Exam: General exam: Frail elderly, demented, contractures Skin: No rashes, lesions or ulcers. HEENT: Atraumatic, normocephalic, no obvious bleeding Lungs: Clear to auscultation bilaterally CVS: Regular rate and rhythm, no murmur GI/Abd soft, nontender, nondistended, bowel present CNS: Alert, awake, not interactive due to dementia Psychiatry: Sad affect Extremities: Contracted extremities  Data Review: I have personally reviewed the laboratory data and studies available.  F/u labs ordered Unresulted Labs (From admission, onward)     Start     Ordered   11/17/22 0500  CBC with Differential/Platelet  Every Monday (0500),   R     Question:  Specimen collection method  Answer:  Lab=Lab collect   11/15/22 1907   11/17/22 [id number]  Basic metabolic panel  Every Monday (0500),   R     Question:  Specimen collection method  Answer:  Lab=Lab collect   11/15/22 1907            Total time spent in review of labs and imaging, patient evaluation, formulation of plan, documentation and communication with family: 25 minutes  Signed, Terrilee Croak, MD [facility] 11/17/2022

## 2022-11-17 NOTE — Consult Note (Signed)
[place] Nurse wound follow up Wound type: pressure  Measurement:  Sacrum Stage 4 Pressure Injury 3.5 cms x 2.5 cms x 1.5 cms with 2 cms undermining from 12 o'clock to 9 o'clock R hip Unstageable Pressure Injury 3 cms x 3 cms x 2 cms 60% tan yellow slough 40% pink moist  R medial foot (base of great toe) Healing stage 4 Pressure Injury 2 cms x 2 cms x 0.1 cms 100% pink and moist  R great toe former area of Deep Tissue Pressure Injury now Stage 2 Pressure Injury 0.5 cms x 0.5 cms x 0.1 cm 100% pink and moist  L lateral foot  Stage 2 Pressure Injury 1 cms x 1 cms x 0.1 cms 100% pink and moist  R 5th digit Deep Tissue Injury 3 cms x 2 cms 100% dry eschar   Drainage (amount, consistency, odor)  Minimal tan exudate from sacral PI and R hip PI, minimal serosanguinous R and L foot wounds other than R 5th digit dry  Periwound: pink scar tissue noted around sacrum, all others intact  Dressing procedure/placement/frequency:  Sacrum continue silver hydrofiber as previously ordered.   R medial foot, R great toe and L lateral foot continue silver hydrofiber daily.  R hip, continue with Medihoney daily.  R 5th digit DTI pain with Betadine twice daily and leave open to air.    Patient remains on low air loss mattress for moisture management and pressure redistribution.  Prevalon boots in place to offload pressure points (patient contracted).   WOC will continue to follow patient weekly.  Thank you,    Winta Barcelo MSN, RN-BC, [employer]

## 2022-11-17 NOTE — TOC Progression Note (Addendum)
Transition of Care [facility]) - Progression Note    Patient Details  Name: Donna Simmons MRN: [id number] Date of Birth: 08/08/1941  Transition of Care [facility]) CM/SW [place], [place] Phone Number: 11/17/2022, 10:30 AM  Clinical Narrative:     CSW has been unable to secure a placement for this pt as facilities are currently limited on their ability to take Medicaid pending pt's and not everyone will accept an LOG from the hospital. CSW followed up with pt's DSS social worker to enquire on the progress of pt's Medicaid application, with the hopes of a bed offer with approved Medicaid. CSW was advised of Medicaid ID#:  [id number]. DSS worker told CSW she could not approve pt for Medicaid unless pt was placed at SNF. CSW asked for clarification, as pt cannot be placed until Medicaid is approved. TOC will continue to follow for clarification.   CSW notified by DSS that pt and spouse have too much money to be approved for Medicaid. They are over the 2000 limit. CSW noting pt with extensive wounds, spoke with facility about qualifying pt for Wound care, doing a spend down, and then attempting Medicaid again. Per facility liaison, [facility] will likely not approve for wound care very long, and does not pay very well. They would accept pt for private pay, but not likely with wound care with [facility]. CSW has already discussed this topic with spouse, and he stated they could not afford to private pay. TOC will continue to work toward a solution.       Expected Discharge Plan and Services                                               Social Determinants of Health (SDOH) Interventions SDOH Screenings   Food Insecurity: Patient Declined (10/19/2022)  Alcohol Screen: Low Risk  (04/12/2019)  Depression (PHQ2-9): Low Risk  (11/05/2020)  Tobacco Use: Low Risk  (10/19/2022)    Readmission Risk Interventions     No data to display

## 2022-11-18 DIAGNOSIS — E870 Hyperosmolality and hypernatremia: Secondary | ICD-10-CM | POA: Diagnosis not present

## 2022-11-18 NOTE — Progress Notes (Signed)
PROGRESS NOTE  Donna Simmons  DOB: 12-16-1940  PCP: [name], DO [id number]  DOA: 08/25/2022  LOS: 8 days  Hospital Day: 27  Brief narrative: Donna Simmons is a 82 y.o. female with PMH significant for Alzheimer's dementia, HTN, HLD, permanent A-fib on Eliquis, GERD. 12/25, patient was sent to ED from [facility] for poor oral intake, increased lethargy, failure to thrive. Workup showed hypernatremia of 167, AKI.  Urine culture grew ESBL E. coli. Patient completed the course of IV ertapenem.  She could not return to SNF because of unpaid bills.  Family was unable to take care of her at home.  Medicaid pending. Difficult to place.  Subjective: Patient was seen and examined this morning.  Demented.  Alert, awake, mumbles on asking a question.  Husband at bedside. No change in status in the last several days.  Assessment and plan: ESBL E. coli UTI Completed 5 days course of ertapenem  Failure to thrive Hypoalbuminemia Patient has poor oral intake, severe contracture, hip woounds 3/16, Foley catheter inserted to promote wound healing on hip for which she is getting hydrotherapy.  An external urinary catheter is not an option after given her severe contraction.  Hypokalemia Improved with replacement.  Check intermittently. Recent Labs  Lab 11/12/22 0045 11/16/22 0252 11/17/22 0727  K 4.2 3.8 3.7  MG 1.9 2.0  --   PHOS  --  2.6  --     Hypernatremia Presented with sodium of 167 due to poor p.o. intake Started on hypotonic fluid.  Sodium level subsequently improved.   Recent Labs  Lab 11/12/22 0045 11/16/22 0252 11/17/22 0727  NA 140 141 138    Acute kidney injury Resolved.  Recent Labs    11/04/22 0316 11/06/22 0335 11/07/22 0330 11/08/22 0342 11/09/22 0429 11/10/22 0240 11/11/22 0111 11/12/22 0045 11/16/22 0252 11/17/22 0727  BUN 12 8 16 12 19 13 13 23  25* 19  CREATININE 0.53 0.46 0.51 0.48 0.60 0.61 0.56 0.64 0.48 0.48  CO2 25 26 25  24 24 30 25 26 25 22     Chronic iron deficiency anemia Hemoglobin low but stable.  Continue iron supplement. Recent Labs    11/03/22 0334 11/04/22 0316 11/12/22 0045 11/16/22 0252 11/17/22 0727  HGB 9.0* 9.3* 9.3* 9.3* 9.6*  MCV 75.6* 73.1* 72.5* 74.1* 73.4*  TIBC  --   --   --   --  256  IRON  --   --   --   --  18*     Permanent atrial fibrillation Continue flecainide, apixaban Metoprolol on hold due to soft blood pressure   Alzheimer's dementia Continue delirium precautions and supportive care. Melatonin for insomnia    Mobility: Bedbound status  Goals of care   Code Status: DNR     DVT prophylaxis:  SCDs Start: 08/25/22 0659 apixaban (ELIQUIS) tablet 5 mg   Antimicrobials: None Fluid: None Consultants: None Family Communication: Husband visits intermittently.  He was at bedside this morning.  Status: Inpatient Level of care:  Med-Surg   Needs to continue in-hospital care:  Difficult to place because of Medicaid pending status  Prognosis:  Poor  Patient from: Home   Scheduled Meds:  apixaban  5 mg Oral BID   Chlorhexidine Gluconate Cloth  6 each Topical Daily   feeding supplement  237 mL Oral TID BM   ferrous sulfate  325 mg Oral QODAY   flecainide  50 mg Oral BID   leptospermum manuka honey  1 Application  Topical Daily   multivitamin with minerals  1 tablet Oral Daily   pantoprazole  40 mg Oral Daily    PRN meds: acetaminophen **OR** acetaminophen, lip balm, melatonin, ondansetron **OR** ondansetron (ZOFRAN) IV   Infusions:    Diet:  Diet Order             DIET - DYS 1 Room service appropriate? Yes with Assist; Fluid consistency: Thin  Diet effective now                   Antimicrobials: Anti-infectives (From admission, onward)    Start     Dose/Rate Route Frequency Ordered Stop   09/03/22 1000  ertapenem (INVANZ) 1,000 mg in sodium chloride 0.9 % 100 mL IVPB        1 g 200 mL/hr over 30 Minutes Intravenous Every 24 hours  09/02/22 1421 09/04/22 2048   08/30/22 1500  ertapenem (INVANZ) 1,000 mg in sodium chloride 0.9 % 100 mL IVPB  Status:  Discontinued        1 g 200 mL/hr over 30 Minutes Intravenous Every 24 hours 08/30/22 1419 09/02/22 1421   08/29/22 1115  cefTRIAXone (ROCEPHIN) 1 g in sodium chloride 0.9 % 100 mL IVPB  Status:  Discontinued        1 g 200 mL/hr over 30 Minutes Intravenous Every 24 hours 08/29/22 1017 08/30/22 1419       Skin assessment:  Pressure Injury 09/04/22 Sacrum Stage 4 - Full thickness tissue loss with exposed bone, tendon or muscle. 3 cms x 3 cms x 1 cm (Active)  09/04/22 1030  Location: Sacrum  Location Orientation:   Staging: Stage 4 - Full thickness tissue loss with exposed bone, tendon or muscle.  Wound Description (Comments): 3 cms x 3 cms x 1 cm  Present on Admission: No     Pressure Injury 09/30/22 Hip Right Unstageable - Full thickness tissue loss in which the base of the injury is covered by slough (yellow, tan, gray, green or brown) and/or eschar (tan, brown or black) in the wound bed. 2 cms x 1 cm x 0.1 cms 80% red mois (Active)  09/30/22 1200  Location: Hip  Location Orientation: Right  Staging: Unstageable - Full thickness tissue loss in which the base of the injury is covered by slough (yellow, tan, gray, green or brown) and/or eschar (tan, brown or black) in the wound bed.  Wound Description (Comments): 2 cms x 1 cm x 0.1 cms 80% red moist 20% yellow  Present on Admission: No     Pressure Injury 10/20/22 Labia Left Stage 2 -  Partial thickness loss of dermis presenting as a shallow open injury with a red, pink wound bed without slough. Abrasion on left labia (Active)  10/20/22 2017  Location: Labia  Location Orientation: Left  Staging: Stage 2 -  Partial thickness loss of dermis presenting as a shallow open injury with a red, pink wound bed without slough.  Wound Description (Comments): Abrasion on left labia  Present on Admission: No      Nutritional  status:  Body mass index is 17.34 kg/m.  Nutrition Problem: Severe Malnutrition Etiology: chronic illness Signs/Symptoms: severe muscle depletion, severe fat depletion     Objective: Vitals:   11/18/22 0500 11/18/22 0829  BP: 96/84 (!) 89/54  Pulse: 88 89  Resp: 16 16  Temp: 97.6 F (36.4 C) 98.3 F (36.8 C)  SpO2: 98% 91%    Intake/Output Summary (Last 24 hours) at 11/18/2022 1246  Last data filed at 11/18/2022 0829 Gross per 24 hour  Intake 480 ml  Output 600 ml  Net -120 ml    Filed Weights   11/14/22 0500 11/15/22 0500 11/18/22 0500  Weight: 43.1 kg 43 kg 43 kg   Weight change:  Body mass index is 17.34 kg/m.   Physical Exam: General exam: Frail elderly, demented, contractures Skin: No rashes, lesions or ulcers. HEENT: Atraumatic, normocephalic, no obvious bleeding Lungs: Clear to auscultation bilaterally CVS: Regular rate and rhythm, no murmur GI/Abd soft, nontender, nondistended, bowel present CNS: Alert, awake, mumbles only on questioning.  Not interactive due to dementia Psychiatry: Sad affect Extremities: Contracted extremities  Data Review: I have personally reviewed the laboratory data and studies available.  F/u labs ordered Unresulted Labs (From admission, onward)     Start     Ordered   11/17/22 0500  CBC with Differential/Platelet  Every Monday (0500),   R     Question:  Specimen collection method  Answer:  Lab=Lab collect   11/15/22 1907   11/17/22 [id number]  Basic metabolic panel  Every Monday (0500),   R     Question:  Specimen collection method  Answer:  Lab=Lab collect   11/15/22 1907            Total time spent in review of labs and imaging, patient evaluation, formulation of plan, documentation and communication with family: 25 minutes  Signed, Terrilee Croak, MD [facility] 11/18/2022

## 2022-11-18 NOTE — Progress Notes (Signed)
Physical Therapy Wound Treatment Patient Details  Name: Donna Simmons MRN: [id number] Date of Birth: 31-Oct-1940  Today's Date: 11/18/2022 Time: [id number] Time Calculation (min): 26 min  Subjective  Subjective Assessment Subjective: Pt alert, no signs of pain Patient and Family Stated Goals: placement in LTACH Prior Treatments: dressing changes  Pain Score:   No indications of pain  Wound Assessment  Pressure Injury 09/30/22 Hip Right Unstageable - Full thickness tissue loss in which the base of the injury is covered by slough (yellow, tan, gray, green or brown) and/or eschar (tan, brown or black) in the wound bed. 2 cms x 1 cm x 0.1 cms 80% red mois (Active)  Wound Image   11/18/22 1028  Dressing Type Foam - Lift dressing to assess site every shift;Gauze (Comment);Honey;Barrier Film (skin prep) 11/18/22 1028  Dressing Changed 11/18/22 1028  Dressing Change Frequency Daily 11/18/22 1028  State of Healing Early/partial granulation 11/18/22 1028  Site / Wound Assessment Granulation tissue;Pale;Pink;Yellow 11/18/22 1028  % Wound base Red or Granulating 30% 11/18/22 1028  % Wound base Yellow/Fibrinous Exudate 70% 11/18/22 1028  % Wound base Black/Eschar 0% 11/18/22 1028  % Wound base Other/Granulation Tissue (Comment) 0% 11/18/22 1028  Peri-wound Assessment Pink 11/18/22 1028  Wound Length (cm) 2.7 cm 11/18/22 1028  Wound Width (cm) 2.4 cm 11/18/22 1028  Wound Depth (cm) 1.7 cm 11/18/22 1028  Wound Surface Area (cm^2) 6.48 cm^2 11/18/22 1028  Wound Volume (cm^3) 11.02 cm^3 11/18/22 1028  Tunneling (cm) 0 11/18/22 1028  Undermining (cm) 0 11/18/22 1028  Margins Unattached edges (unapproximated) 11/18/22 1028  Drainage Amount Moderate 11/18/22 1028  Drainage Description Purulent 11/18/22 1028  Treatment Debridement (Selective);Irrigation;Packing (Dry gauze) 11/18/22 1028      Selective Debridement (non-excisional) Selective Debridement (non-excisional) - Location: R  hip Selective Debridement (non-excisional) - Tools Used: Forceps, Scalpel Selective Debridement (non-excisional) - Tissue Removed: necrotic slough    Wound Assessment and Plan  Wound Therapy - Assess/Plan/Recommendations Wound Therapy - Clinical Statement: Wound measurements and percentage necrotic tissue all increased this week after wound "opening up" on 3/15. Patient tolerating debridement without issue, however somewhat difficult to get to base of wound as it is a relatively narrow opening. Wound Therapy - Functional Problem List: unable to weightshift. lacks ability to understand wound treatment process Factors Delaying/Impairing Wound Healing: Immobility, Incontinence, Multiple medical problems Hydrotherapy Plan: Debridement, Dressing change, Patient/family education Wound Therapy - Frequency: Other (comment) (2xwk) Wound Therapy - Follow Up Recommendations: f/u selective debridement, dressing changes by RN  Wound Therapy Goals- Improve the function of patient's integumentary system by progressing the wound(s) through the phases of wound healing (inflammation - proliferation - remodeling) by: Wound Therapy Goals - Improve the function of patient's integumentary system by progressing the wound(s) through the phases of wound healing by: Decrease Necrotic Tissue to: 20% Decrease Necrotic Tissue - Progress: Not progressing Increase Granulation Tissue to: 80% Increase Granulation Tissue - Progress: Mot progressing Decrease Length/Width/Depth by (cm): 0.2/0.2/0.5 Decrease Length/Width/Depth - Progress: Goal set today Improve Drainage Characteristics: Min Improve Drainage Characteristics - Progress: Goal set today Goals/treatment plan/discharge plan were made with and agreed upon by patient/family: No, Patient unable to participate in goals/treatment/discharge plan and family unavailable Time For Goal Achievement: 7 days Wound Therapy - Potential for Goals: Good  Goals will be updated  until maximal potential achieved or discharge criteria met.  Discharge criteria: when goals achieved, discharge from hospital, MD decision/surgical intervention, no progress towards goals, refusal/missing three consecutive treatments without notification or medical  reason.  GP     Charges PT Wound Care Charges $Wound Debridement up to 20 cm: < or equal to 20 cm $PT Hydrotherapy Dressing: 1 dressing $PT Hydrotherapy Visit: 1 Visit      Arby Barrette, PT Acute Rehabilitation Services  Office [phone]   Rexanne Mano 11/18/2022, 10:39 AM

## 2022-11-18 NOTE — TOC Progression Note (Signed)
Transition of Care [facility]) - Progression Note    Patient Details  Name: JODI MCGURL MRN: [id number] Date of Birth: 06/23/1941  Transition of Care [facility]) CM/SW [place], [place] Phone Number: 11/18/2022, 11:55 AM  Clinical Narrative:    CSW spoke with pt and spouse at bedside and explained the barriers to placement. CSW discussed the current options being private paying until under the money limit to reapply for Medicaid, wether that be at home or at a facility. Spouse stating there is no way to take her home and would have to consider the other option. CSW requested DSS worker speak with spouse about spend down option. TOC will continue to follow.         Expected Discharge Plan and Services                                               Social Determinants of Health (SDOH) Interventions SDOH Screenings   Food Insecurity: Patient Declined (10/19/2022)  Alcohol Screen: Low Risk  (04/12/2019)  Depression (PHQ2-9): Low Risk  (11/05/2020)  Tobacco Use: Low Risk  (10/19/2022)    Readmission Risk Interventions     No data to display

## 2022-11-18 NOTE — Consult Note (Addendum)
[place] Nurse wound follow up Weekly pressure injury follow up Wound type:pressure injuries Sacrum; Stage 4; 100% clean, pink, 3.0cm x 2.0cm x 1.0cm; continue silver hydrofiber and foam topper every other day Right medial foot; right great toe; each 0.5cm x 0.5cm x 0.1cm; 100% clean and pink; DC silver use foam dressing only; change every 3 days Right 5th digit; 3cm x 2cm x 0cm; continue betadine application daily Left lateral foot; Stage 3; 0.5cm x 0.5cm x 0.1cm; [id number] clean/pink, silicone foam Right trochanter; Unstageable; see PT notes for measurements and description. Continue PT 2x wk for treatment and Medihoney between treatments daily per bedside nurses.   Measurement:see above  Wound bed:see above  Drainage (amount, consistency, odor) no significant drainage from LEs wounds or sacrum Periwound: intact  Dressing procedure/placement/frequency: Continue PT hydrotherapy to the right trochanter wound 2x wk with Medihoney to the right trochanter  Silicone foam only to the bilateral foot wounds Offload with PRevalon boots at all times Continue silver hydrofiber to the sacral wound daily, top with foam Low air loss mattress for moisture management and pressure redistribution  RD for supplementation for wound healing  Met with PT performing hydrotherapy, continue   [facility] Nurse team will follow with you and see patient within 10 days for wound assessments.  Please notify [facility] nurses of any acute changes in the wounds or any new areas of concern [place] MSN, [place], [place], [place]

## 2022-11-19 NOTE — Progress Notes (Signed)
PROGRESS NOTE  Donna Simmons  DOB: 06-06-1941  PCP: [name], DO [id number]  DOA: 08/25/2022  LOS: 23 days  Hospital Day: 63  Brief narrative: Donna Simmons is a 82 y.o. female with PMH significant for Alzheimer's dementia, HTN, HLD, permanent A-fib on Eliquis, GERD. 12/25, patient was sent to ED from [facility] for poor oral intake, increased lethargy, failure to thrive. Workup showed hypernatremia of 167, AKI.  Urine culture grew ESBL E. coli. Patient completed the course of IV ertapenem.  She could not return to SNF because of unpaid bills.  Family was unable to take care of her at home.  Medicaid pending. Difficult to place.  Subjective: Patient was seen and examined this morning.  Lying on bed.  demented.  Not interactive.  Family not at bedside  Assessment and plan: ESBL E. coli UTI Completed 5 days course of ertapenem  Failure to thrive Hypoalbuminemia Patient has poor oral intake, severe contracture, hip woounds 3/16, Foley catheter inserted to promote wound healing on hip for which she is getting hydrotherapy.  An external urinary catheter is not an option after given her severe contraction.  Hypokalemia Improved with replacement.  Check intermittently. Recent Labs  Lab 11/16/22 0252 11/17/22 0727  K 3.8 3.7  MG 2.0  --   PHOS 2.6  --     Hypernatremia Presented with sodium of 167 due to poor p.o. intake Started on hypotonic fluid.  Sodium level subsequently improved.   Recent Labs  Lab 11/16/22 0252 11/17/22 0727  NA 141 138    Acute kidney injury Resolved.  Recent Labs    11/04/22 0316 11/06/22 0335 11/07/22 0330 11/08/22 0342 11/09/22 0429 11/10/22 0240 11/11/22 0111 11/12/22 0045 11/16/22 0252 11/17/22 0727  BUN 12 8 16 12 19 13 13 23  25* 19  CREATININE 0.53 0.46 0.51 0.48 0.60 0.61 0.56 0.64 0.48 0.48  CO2 25 26 25 24 24 30 25 26 25  22    Chronic iron deficiency anemia Hemoglobin low but stable.  Continue iron  supplement. Recent Labs    11/03/22 0334 11/04/22 0316 11/12/22 0045 11/16/22 0252 11/17/22 0727  HGB 9.0* 9.3* 9.3* 9.3* 9.6*  MCV 75.6* 73.1* 72.5* 74.1* 73.4*  TIBC  --   --   --   --  256  IRON  --   --   --   --  18*     Permanent atrial fibrillation Continue flecainide, apixaban Metoprolol on hold due to soft blood pressure   Alzheimer's dementia Continue delirium precautions and supportive care. Melatonin for insomnia    Mobility: Bedbound status  Goals of care   Code Status: DNR     DVT prophylaxis:  SCDs Start: 08/25/22 0659 apixaban (ELIQUIS) tablet 5 mg   Antimicrobials: None Fluid: None Consultants: None Family Communication: Husband visits intermittently.    Status: Inpatient Level of care:  Med-Surg   Needs to continue in-hospital care:  Difficult to place because of Medicaid pending status  Prognosis:  Poor  Patient from: Home   Scheduled Meds:  apixaban  5 mg Oral BID   Chlorhexidine Gluconate Cloth  6 each Topical Daily   feeding supplement  237 mL Oral TID BM   ferrous sulfate  325 mg Oral QODAY   flecainide  50 mg Oral BID   leptospermum manuka honey  1 Application Topical Daily   multivitamin with minerals  1 tablet Oral Daily   pantoprazole  40 mg Oral Daily    PRN  meds: acetaminophen **OR** acetaminophen, lip balm, melatonin, ondansetron **OR** ondansetron (ZOFRAN) IV   Infusions:    Diet:  Diet Order             DIET - DYS 1 Room service appropriate? Yes with Assist; Fluid consistency: Thin  Diet effective now                   Antimicrobials: Anti-infectives (From admission, onward)    Start     Dose/Rate Route Frequency Ordered Stop   09/03/22 1000  ertapenem (INVANZ) 1,000 mg in sodium chloride 0.9 % 100 mL IVPB        1 g 200 mL/hr over 30 Minutes Intravenous Every 24 hours 09/02/22 1421 09/04/22 2048   08/30/22 1500  ertapenem (INVANZ) 1,000 mg in sodium chloride 0.9 % 100 mL IVPB  Status:   Discontinued        1 g 200 mL/hr over 30 Minutes Intravenous Every 24 hours 08/30/22 1419 09/02/22 1421   08/29/22 1115  cefTRIAXone (ROCEPHIN) 1 g in sodium chloride 0.9 % 100 mL IVPB  Status:  Discontinued        1 g 200 mL/hr over 30 Minutes Intravenous Every 24 hours 08/29/22 1017 08/30/22 1419       Skin assessment:  Pressure Injury 09/04/22 Sacrum Stage 4 - Full thickness tissue loss with exposed bone, tendon or muscle. 3 cms x 3 cms x 1 cm (Active)  09/04/22 1030  Location: Sacrum  Location Orientation:   Staging: Stage 4 - Full thickness tissue loss with exposed bone, tendon or muscle.  Wound Description (Comments): 3 cms x 3 cms x 1 cm  Present on Admission: No     Pressure Injury 09/30/22 Hip Right Unstageable - Full thickness tissue loss in which the base of the injury is covered by slough (yellow, tan, gray, green or brown) and/or eschar (tan, brown or black) in the wound bed. 2 cms x 1 cm x 0.1 cms 80% red mois (Active)  09/30/22 1200  Location: Hip  Location Orientation: Right  Staging: Unstageable - Full thickness tissue loss in which the base of the injury is covered by slough (yellow, tan, gray, green or brown) and/or eschar (tan, brown or black) in the wound bed.  Wound Description (Comments): 2 cms x 1 cm x 0.1 cms 80% red moist 20% yellow  Present on Admission: No     Pressure Injury 10/20/22 Labia Left Stage 2 -  Partial thickness loss of dermis presenting as a shallow open injury with a red, pink wound bed without slough. Abrasion on left labia (Active)  10/20/22 2017  Location: Labia  Location Orientation: Left  Staging: Stage 2 -  Partial thickness loss of dermis presenting as a shallow open injury with a red, pink wound bed without slough.  Wound Description (Comments): Abrasion on left labia  Present on Admission: No      Nutritional status:  Body mass index is 17.34 kg/m.  Nutrition Problem: Severe Malnutrition Etiology: chronic  illness Signs/Symptoms: severe muscle depletion, severe fat depletion     Objective: Vitals:   11/19/22 0500 11/19/22 0818  BP: (!) 90/56 112/61  Pulse: 86 78  Resp: 16 17  Temp: 98.2 F (36.8 C) 98.3 F (36.8 C)  SpO2: 98% 100%    Intake/Output Summary (Last 24 hours) at 11/19/2022 1330 Last data filed at 11/19/2022 0900 Gross per 24 hour  Intake 480 ml  Output 800 ml  Net -320 ml  Filed Weights   11/14/22 0500 11/15/22 0500 11/18/22 0500  Weight: 43.1 kg 43 kg 43 kg   Weight change:  Body mass index is 17.34 kg/m.   Physical Exam: General exam: Frail elderly, demented, contractures Skin: No rashes, lesions or ulcers. HEENT: Atraumatic, normocephalic, no obvious bleeding Lungs: Clear to auscultation bilaterally CVS: Regular rate and rhythm, no murmur GI/Abd soft, nontender, nondistended, bowel present CNS: Alert, awake, mumbles only on questioning.  Not interactive due to dementia Psychiatry: Sad affect Extremities: Contracted extremities  Data Review: I have personally reviewed the laboratory data and studies available.  F/u labs ordered Unresulted Labs (From admission, onward)     Start     Ordered   11/17/22 0500  CBC with Differential/Platelet  Every Monday (0500),   R     Question:  Specimen collection method  Answer:  Lab=Lab collect   11/15/22 1907   11/17/22 [id number]  Basic metabolic panel  Every Monday (0500),   R     Question:  Specimen collection method  Answer:  Lab=Lab collect   11/15/22 1907            Total time spent in review of labs and imaging, patient evaluation, formulation of plan, documentation and communication with family: 25 minutes  Signed, Terrilee Croak, MD [facility] 11/19/2022

## 2022-11-19 NOTE — Plan of Care (Signed)

## 2022-11-20 NOTE — Progress Notes (Signed)
PROGRESS NOTE  Donna Simmons  PCP: [name], DO [id number]  DOA: 08/25/2022  LOS: 74 days  Hospital Day: 71  Brief narrative: Donna Simmons is a 82 y.o. female with PMH significant for Alzheimer's dementia, HTN, HLD, permanent A-fib on Eliquis, GERD. 12/25, patient was sent to ED from [facility] for poor oral intake, increased lethargy, failure to thrive. Workup showed hypernatremia of 167, AKI.  Urine culture grew ESBL E. coli. Patient completed the course of IV ertapenem.  She could not return to SNF because of unpaid bills.  Family was unable to take care of her at home.  Medicaid pending. Difficult to place.  Subjective: Patient was seen and examined this morning.  Lying on bed.  demented.  Not interactive.  Family not at bedside  Assessment and plan: ESBL E. coli UTI Completed 5 days course of ertapenem  Failure to thrive Hypoalbuminemia Patient has poor oral intake, severe contracture, hip woounds 3/16, Foley catheter inserted to promote wound healing on hip for which she is getting hydrotherapy.  An external urinary catheter is not an option after given her severe contraction.  Hypokalemia Improved with replacement.  Check intermittently. Recent Labs  Lab 11/16/22 0252 11/17/22 0727  K 3.8 3.7  MG 2.0  --   PHOS 2.6  --     Hypernatremia Presented with sodium of 167 due to poor p.o. intake Started on hypotonic fluid.  Sodium level subsequently improved.   Recent Labs  Lab 11/16/22 0252 11/17/22 0727  NA 141 138    Acute kidney injury Resolved.  Recent Labs    11/04/22 0316 11/06/22 0335 11/07/22 0330 11/08/22 0342 11/09/22 0429 11/10/22 0240 11/11/22 0111 11/12/22 0045 11/16/22 0252 11/17/22 0727  BUN 12 8 16 12 19 13 13 23  25* 19  CREATININE 0.53 0.46 0.51 0.48 0.60 0.61 0.56 0.64 0.48 0.48  CO2 25 26 25 24 24 30 25 26 25  22    Chronic iron deficiency anemia Hemoglobin low but stable.  Continue iron  supplement. Recent Labs    11/03/22 0334 11/04/22 0316 11/12/22 0045 11/16/22 0252 11/17/22 0727  HGB 9.0* 9.3* 9.3* 9.3* 9.6*  MCV 75.6* 73.1* 72.5* 74.1* 73.4*  TIBC  --   --   --   --  256  IRON  --   --   --   --  18*     Permanent atrial fibrillation Continue flecainide, apixaban Metoprolol on hold due to soft blood pressure   Alzheimer's dementia Continue delirium precautions and supportive care. Melatonin for insomnia    Mobility: Bedbound status  Goals of care   Code Status: DNR     DVT prophylaxis:  SCDs Start: 08/25/22 0659 apixaban (ELIQUIS) tablet 5 mg   Antimicrobials: None Fluid: None Consultants: None Family Communication: Husband visits intermittently.    Status: Inpatient Level of care:  Med-Surg   Needs to continue in-hospital care:  Difficult to place because of Medicaid pending status  Prognosis:  Poor  Patient from: Home   Scheduled Meds:  apixaban  5 mg Oral BID   Chlorhexidine Gluconate Cloth  6 each Topical Daily   feeding supplement  237 mL Oral TID BM   ferrous sulfate  325 mg Oral QODAY   flecainide  50 mg Oral BID   leptospermum manuka honey  1 Application Topical Daily   multivitamin with minerals  1 tablet Oral Daily   pantoprazole  40 mg Oral Daily    PRN  meds: acetaminophen **OR** acetaminophen, lip balm, melatonin, ondansetron **OR** ondansetron (ZOFRAN) IV   Infusions:    Diet:  Diet Order             DIET - DYS 1 Room service appropriate? Yes with Assist; Fluid consistency: Thin  Diet effective now                   Antimicrobials: Anti-infectives (From admission, onward)    Start     Dose/Rate Route Frequency Ordered Stop   09/03/22 1000  ertapenem (INVANZ) 1,000 mg in sodium chloride 0.9 % 100 mL IVPB        1 g 200 mL/hr over 30 Minutes Intravenous Every 24 hours 09/02/22 1421 09/04/22 2048   08/30/22 1500  ertapenem (INVANZ) 1,000 mg in sodium chloride 0.9 % 100 mL IVPB  Status:   Discontinued        1 g 200 mL/hr over 30 Minutes Intravenous Every 24 hours 08/30/22 1419 09/02/22 1421   08/29/22 1115  cefTRIAXone (ROCEPHIN) 1 g in sodium chloride 0.9 % 100 mL IVPB  Status:  Discontinued        1 g 200 mL/hr over 30 Minutes Intravenous Every 24 hours 08/29/22 1017 08/30/22 1419       Skin assessment:  Pressure Injury 09/04/22 Sacrum Stage 4 - Full thickness tissue loss with exposed bone, tendon or muscle. 3 cms x 3 cms x 1 cm (Active)  09/04/22 1030  Location: Sacrum  Location Orientation:   Staging: Stage 4 - Full thickness tissue loss with exposed bone, tendon or muscle.  Wound Description (Comments): 3 cms x 3 cms x 1 cm  Present on Admission: No     Pressure Injury 09/30/22 Hip Right Unstageable - Full thickness tissue loss in which the base of the injury is covered by slough (yellow, tan, gray, green or brown) and/or eschar (tan, brown or black) in the wound bed. 2 cms x 1 cm x 0.1 cms 80% red mois (Active)  09/30/22 1200  Location: Hip  Location Orientation: Right  Staging: Unstageable - Full thickness tissue loss in which the base of the injury is covered by slough (yellow, tan, gray, green or brown) and/or eschar (tan, brown or black) in the wound bed.  Wound Description (Comments): 2 cms x 1 cm x 0.1 cms 80% red moist 20% yellow  Present on Admission: No     Pressure Injury 10/20/22 Labia Left Stage 2 -  Partial thickness loss of dermis presenting as a shallow open injury with a red, pink wound bed without slough. Abrasion on left labia (Active)  10/20/22 2017  Location: Labia  Location Orientation: Left  Staging: Stage 2 -  Partial thickness loss of dermis presenting as a shallow open injury with a red, pink wound bed without slough.  Wound Description (Comments): Abrasion on left labia  Present on Admission: No      Nutritional status:  Body mass index is 17.34 kg/m.  Nutrition Problem: Severe Malnutrition Etiology: chronic  illness Signs/Symptoms: severe muscle depletion, severe fat depletion     Objective: Vitals:   11/20/22 0435 11/20/22 0919  BP: 109/68 (!) 96/57  Pulse: 71 79  Resp: 16 16  Temp: 98.3 F (36.8 C) 98.1 F (36.7 C)  SpO2: 100% 99%    Intake/Output Summary (Last 24 hours) at 11/20/2022 1417 Last data filed at 11/20/2022 0252 Gross per 24 hour  Intake 250 ml  Output 750 ml  Net -500 ml  Filed Weights   11/14/22 0500 11/15/22 0500 11/18/22 0500  Weight: 43.1 kg 43 kg 43 kg   Weight change:  Body mass index is 17.34 kg/m.   Physical Exam: General exam: Frail elderly, demented, contractures Skin: No rashes, lesions or ulcers. HEENT: Atraumatic, normocephalic, no obvious bleeding Lungs: Clear to auscultation bilaterally CVS: Regular rate and rhythm, no murmur GI/Abd soft, nontender, nondistended, bowel present CNS: Alert, awake, mumbles only on questioning.  Not interactive due to dementia Psychiatry: Sad affect Extremities: Contracted extremities  Data Review: I have personally reviewed the laboratory data and studies available.  F/u labs ordered Unresulted Labs (From admission, onward)     Start     Ordered   11/17/22 0500  CBC with Differential/Platelet  Every Monday (0500),   R     Question:  Specimen collection method  Answer:  Lab=Lab collect   11/15/22 1907   11/17/22 [id number]  Basic metabolic panel  Every Monday (0500),   R     Question:  Specimen collection method  Answer:  Lab=Lab collect   11/15/22 1907            Total time spent in review of labs and imaging, patient evaluation, formulation of plan, documentation and communication with family: 25 minutes  Signed, Terrilee Croak, MD [facility] 11/20/2022

## 2022-11-20 NOTE — Care Management Important Message (Signed)
Important Message  Patient Details  Name: FLORENDA DELANY MRN: [id number] Date of Birth: [birth year]   Medicare Important Message Given:  Yes     Hannah Beat 11/20/2022, 2:09 PM

## 2022-11-21 NOTE — Progress Notes (Signed)
PROGRESS NOTE  Donna Simmons  DOB: 1941/04/02  PCP: [name], DO [id number]  DOA: 08/25/2022  LOS: 42 days  Hospital Day: 69  Brief narrative: Donna Simmons is a 82 y.o. female with PMH significant for Alzheimer's dementia, HTN, HLD, permanent A-fib on Eliquis, GERD. 12/25, patient was sent to ED from [facility] for poor oral intake, increased lethargy, failure to thrive. Workup showed hypernatremia of 167, AKI.  Urine culture grew ESBL E. coli. Patient completed the course of IV ertapenem.  She could not return to SNF because of unpaid bills.  Family was unable to take care of her at home.  Medicaid pending. Difficult to place.  Subjective: Patient was seen and examined this morning.  Lying on bed.  demented.   IV therapy was ongoing.  Assessment and plan: ESBL E. coli UTI Completed 5 days course of ertapenem  Failure to thrive Hypoalbuminemia Patient has poor oral intake, severe contracture, hip woounds 3/16, Foley catheter inserted to promote wound healing on hip for which she is getting hydrotherapy.  An external urinary catheter is not an option after given her severe contraction.  Hypokalemia Improved with replacement.  Check intermittently. Recent Labs  Lab 11/16/22 0252 11/17/22 0727  K 3.8 3.7  MG 2.0  --   PHOS 2.6  --     Hypernatremia Presented with sodium of 167 due to poor p.o. intake Started on hypotonic fluid.  Sodium level subsequently improved.   Recent Labs  Lab 11/16/22 0252 11/17/22 0727  NA 141 138    Acute kidney injury Resolved.  Recent Labs    11/04/22 0316 11/06/22 0335 11/07/22 0330 11/08/22 0342 11/09/22 0429 11/10/22 0240 11/11/22 0111 11/12/22 0045 11/16/22 0252 11/17/22 0727  BUN 12 8 16 12 19 13 13 23  25* 19  CREATININE 0.53 0.46 0.51 0.48 0.60 0.61 0.56 0.64 0.48 0.48  CO2 25 26 25 24 24 30 25 26 25  22    Chronic iron deficiency anemia Hemoglobin low but stable.  Continue iron  supplement. Recent Labs    11/03/22 0334 11/04/22 0316 11/12/22 0045 11/16/22 0252 11/17/22 0727  HGB 9.0* 9.3* 9.3* 9.3* 9.6*  MCV 75.6* 73.1* 72.5* 74.1* 73.4*  TIBC  --   --   --   --  256  IRON  --   --   --   --  18*    Permanent atrial fibrillation Continue flecainide, apixaban Metoprolol on hold due to soft blood pressure   Alzheimer's dementia Continue delirium precautions and supportive care. Melatonin for insomnia    Mobility: Bedbound status  Goals of care   Code Status: DNR     DVT prophylaxis:  SCDs Start: 08/25/22 0659 apixaban (ELIQUIS) tablet 5 mg   Antimicrobials: None Fluid: None Consultants: None Family Communication: Husband visits intermittently.    Status: Inpatient Level of care:  Med-Surg   Needs to continue in-hospital care:  Difficult to place because of Medicaid pending status  Prognosis:  Poor  Patient from: Home   Scheduled Meds:  apixaban  5 mg Oral BID   Chlorhexidine Gluconate Cloth  6 each Topical Daily   feeding supplement  237 mL Oral TID BM   ferrous sulfate  325 mg Oral QODAY   flecainide  50 mg Oral BID   leptospermum manuka honey  1 Application Topical Daily   multivitamin with minerals  1 tablet Oral Daily   pantoprazole  40 mg Oral Daily    PRN meds: acetaminophen **OR**  acetaminophen, lip balm, melatonin, ondansetron **OR** ondansetron (ZOFRAN) IV   Infusions:    Diet:  Diet Order             DIET - DYS 1 Room service appropriate? Yes with Assist; Fluid consistency: Thin  Diet effective now                   Antimicrobials: Anti-infectives (From admission, onward)    Start     Dose/Rate Route Frequency Ordered Stop   09/03/22 1000  ertapenem (INVANZ) 1,000 mg in sodium chloride 0.9 % 100 mL IVPB        1 g 200 mL/hr over 30 Minutes Intravenous Every 24 hours 09/02/22 1421 09/04/22 2048   08/30/22 1500  ertapenem (INVANZ) 1,000 mg in sodium chloride 0.9 % 100 mL IVPB  Status:  Discontinued         1 g 200 mL/hr over 30 Minutes Intravenous Every 24 hours 08/30/22 1419 09/02/22 1421   08/29/22 1115  cefTRIAXone (ROCEPHIN) 1 g in sodium chloride 0.9 % 100 mL IVPB  Status:  Discontinued        1 g 200 mL/hr over 30 Minutes Intravenous Every 24 hours 08/29/22 1017 08/30/22 1419       Skin assessment:  Pressure Injury 09/04/22 Sacrum Stage 4 - Full thickness tissue loss with exposed bone, tendon or muscle. 3 cms x 3 cms x 1 cm (Active)  09/04/22 1030  Location: Sacrum  Location Orientation:   Staging: Stage 4 - Full thickness tissue loss with exposed bone, tendon or muscle.  Wound Description (Comments): 3 cms x 3 cms x 1 cm  Present on Admission: No     Pressure Injury 09/30/22 Hip Right Unstageable - Full thickness tissue loss in which the base of the injury is covered by slough (yellow, tan, gray, green or brown) and/or eschar (tan, brown or black) in the wound bed. 2 cms x 1 cm x 0.1 cms 80% red mois (Active)  09/30/22 1200  Location: Hip  Location Orientation: Right  Staging: Unstageable - Full thickness tissue loss in which the base of the injury is covered by slough (yellow, tan, gray, green or brown) and/or eschar (tan, brown or black) in the wound bed.  Wound Description (Comments): 2 cms x 1 cm x 0.1 cms 80% red moist 20% yellow  Present on Admission: No     Pressure Injury 10/20/22 Labia Left Stage 2 -  Partial thickness loss of dermis presenting as a shallow open injury with a red, pink wound bed without slough. Abrasion on left labia (Active)  10/20/22 2017  Location: Labia  Location Orientation: Left  Staging: Stage 2 -  Partial thickness loss of dermis presenting as a shallow open injury with a red, pink wound bed without slough.  Wound Description (Comments): Abrasion on left labia  Present on Admission: No      Nutritional status:  Body mass index is 17.34 kg/m.  Nutrition Problem: Severe Malnutrition Etiology: chronic illness Signs/Symptoms: severe  muscle depletion, severe fat depletion     Objective: Vitals:   11/21/22 0341 11/21/22 0755  BP: 122/76 120/80  Pulse: 89 75  Resp: 17 17  Temp: 98.3 F (36.8 C) (!) 97.5 F (36.4 C)  SpO2: 100% 98%    Intake/Output Summary (Last 24 hours) at 11/21/2022 1634 Last data filed at 11/21/2022 0900 Gross per 24 hour  Intake 100 ml  Output 1150 ml  Net -1050 ml    [employer]  11/14/22 0500 11/15/22 0500 11/18/22 0500  Weight: 43.1 kg 43 kg 43 kg   Weight change:  Body mass index is 17.34 kg/m.   Physical Exam: General exam: Frail elderly, demented, contractures Skin: No rashes, lesions or ulcers. HEENT: Atraumatic, normocephalic, no obvious bleeding Lungs: Clear to auscultation bilaterally CVS: Regular rate and rhythm, no murmur GI/Abd soft, nontender, nondistended, bowel present CNS: Alert, awake, mumbles only on questioning.  Not interactive due to dementia Psychiatry: Sad affect Extremities: Contracted extremities  Data Review: I have personally reviewed the laboratory data and studies available.  F/u labs ordered Unresulted Labs (From admission, onward)     Start     Ordered   11/17/22 0500  CBC with Differential/Platelet  Every Monday (0500),   R     Question:  Specimen collection method  Answer:  Lab=Lab collect   11/15/22 1907   11/17/22 [id number]  Basic metabolic panel  Every Monday (0500),   R     Question:  Specimen collection method  Answer:  Lab=Lab collect   11/15/22 1907            Total time spent in review of labs and imaging, patient evaluation, formulation of plan, documentation and communication with family: 25 minutes  Signed, Terrilee Croak, MD [facility] 11/21/2022

## 2022-11-21 NOTE — TOC Progression Note (Signed)
Transition of Care [facility]) - Progression Note    Patient Details  Name: Donna Simmons MRN: [id number] Date of Birth: 12-18-1940  Transition of Care [facility] CM/SW [place], [place] Phone Number: 11/21/2022, 10:28 AM  Clinical Narrative:     Pt was admitted on Christmas Day from [facility] due to failure to thrive. Per [place], pt owed several thousand dollars, and it was determined she was no longer appropriate for SNF level services. LTC options were discussed, but spouse had not started Medicaid application. This was started and a search was put out for a LTC bed with an LOG. [name] has spoken with palliative and is not ready to initiate hospice services yet.  Spouse states pt cannot return home, there is no one to care for her. Previous conversations regarding this matter, spouse could not afford to pay out of pocket for services at LTC or home. Per Medicaid, pt and spouse have over the 2000 reserve limit and cannot be approved for Medicaid at this time. CSW requested DSS follow up with husband to discuss spend down options, and exactly what he will need to do to meet qualifications. Spouse still has not received an update from them.  Pt currently with wounds and poor oral intake. Usually non verbal, non mobile. LOG was not accepted and can now not be offered due to Medicaid declining. CSW to consult DTP team to review for further assistance with disposition. Pt continues to remain stable at this time. TOC will continue to follow.        Expected Discharge Plan and Services                                               Social Determinants of Health (SDOH) Interventions SDOH Screenings   Food Insecurity: Patient Declined (10/19/2022)  Alcohol Screen: Low Risk  (04/12/2019)  Depression (PHQ2-9): Low Risk  (11/05/2020)  Tobacco Use: Low Risk  (10/19/2022)    Readmission Risk Interventions     No data to display

## 2022-11-21 NOTE — Progress Notes (Signed)
Physical Therapy Wound Treatment Patient Details  Name: Donna Simmons MRN: [id number] Date of Birth: 16-Jul-1941  Today's Date: 11/21/2022 Time: 1020-1050 Time Calculation (min): 30 min  Subjective  Subjective Assessment Subjective: Pt speaking a few words and agreeable to session. Patient and Family Stated Goals: placement in LTACH Date of Onset:  (unsure) Prior Treatments: dressing changes  Pain Score:  Pre-medicated with oral pain meds, mild pain noted intermittently during session  Wound Assessment  Pressure Injury 09/30/22 Hip Right Unstageable - Full thickness tissue loss in which the base of the injury is covered by slough (yellow, tan, gray, green or brown) and/or eschar (tan, brown or black) in the wound bed. 2 cms x 1 cm x 0.1 cms 80% red mois (Active)  Wound Image   11/18/22 1028  Dressing Type Foam - Lift dressing to assess site every shift;Gauze (Comment);Honey;Barrier Film (skin prep);Normal saline moist dressing 11/21/22 1313  Dressing Changed 11/21/22 1313  Dressing Change Frequency Daily 11/21/22 1313  State of Healing Early/partial granulation 11/21/22 1313  Site / Wound Assessment Granulation tissue;Pale;Pink;Yellow 11/21/22 1313  % Wound base Red or Granulating 55% 11/21/22 1313  % Wound base Yellow/Fibrinous Exudate 45% 11/21/22 1313  % Wound base Black/Eschar 0% 11/21/22 1313  % Wound base Other/Granulation Tissue (Comment) 0% 11/21/22 1313  Peri-wound Assessment Pink 11/21/22 1313  Wound Length (cm) 2.7 cm 11/18/22 1028  Wound Width (cm) 2.4 cm 11/18/22 1028  Wound Depth (cm) 1.7 cm 11/18/22 1028  Wound Surface Area (cm^2) 6.48 cm^2 11/18/22 1028  Wound Volume (cm^3) 11.02 cm^3 11/18/22 1028  Tunneling (cm) 0 11/18/22 1028  Undermining (cm) 0 11/18/22 1028  Margins Unattached edges (unapproximated) 11/21/22 1313  Drainage Amount Moderate 11/21/22 1313  Drainage Description Odor - foul;Purulent 11/21/22 1313  Treatment Debridement  (Selective);Irrigation;Packing (Saline gauze) 11/21/22 1313   Selective Debridement (non-excisional) Selective Debridement (non-excisional) - Location: R hip Selective Debridement (non-excisional) - Tools Used: Forceps, Scalpel, Scissors Selective Debridement (non-excisional) - Tissue Removed: yellow necrotic slough    Wound Assessment and Plan  Wound Therapy - Assess/Plan/Recommendations Wound Therapy - Clinical Statement: Pt's wound bed appears to be improving with decreased yellow necrotic slough. Her wound has a small opening, making it difficult at times to remove the unviable tissue. Pt tolderated session well being pre-medicated, noted to have brief moments of mild pain. This patient will benefit from continued wound therapy for selective removal of unviable tissue, to decrease bioburden, and promote wound bed healing. Wound Therapy - Functional Problem List: unable to weightshift. lacks ability to understand wound treatment process Factors Delaying/Impairing Wound Healing: Immobility, Incontinence, Multiple medical problems Hydrotherapy Plan: Debridement, Dressing change, Patient/family education Wound Therapy - Frequency: Other (comment) (2x/week) Wound Therapy - Follow Up Recommendations: f/u selective debridement, dressing changes by RN  Wound Therapy Goals- Improve the function of patient's integumentary system by progressing the wound(s) through the phases of wound healing (inflammation - proliferation - remodeling) by: Wound Therapy Goals - Improve the function of patient's integumentary system by progressing the wound(s) through the phases of wound healing by: Decrease Necrotic Tissue to: 20% Decrease Necrotic Tissue - Progress: Progressing toward goal Increase Granulation Tissue to: 80% Increase Granulation Tissue - Progress: Progressing toward goal Decrease Length/Width/Depth by (cm): 0.2/0.2/0.5 Decrease Length/Width/Depth - Progress: Progressing toward goal Improve  Drainage Characteristics: Min Improve Drainage Characteristics - Progress: Progressing toward goal Goals/treatment plan/discharge plan were made with and agreed upon by patient/family: No, Patient unable to participate in goals/treatment/discharge plan and family unavailable Time For Goal  Achievement: 7 days Wound Therapy - Potential for Goals: Good  Goals will be updated until maximal potential achieved or discharge criteria met.  Discharge criteria: when goals achieved, discharge from hospital, MD decision/surgical intervention, no progress towards goals, refusal/missing three consecutive treatments without notification or medical reason.  GP     Charges PT Wound Care Charges $Wound Debridement up to 20 cm: < or equal to 20 cm $PT Hydrotherapy Dressing: 1 dressing $PT Hydrotherapy Visit: 1 Visit     Moishe Spice, PT, DPT Acute Rehabilitation Services  Office: [phone]   Orvan Falconer 11/21/2022, 1:27 PM

## 2022-11-22 ENCOUNTER — Encounter (HOSPITAL_COMMUNITY): Payer: Self-pay | Admitting: Internal Medicine

## 2022-11-22 DIAGNOSIS — L8990 Pressure ulcer of unspecified site, unspecified stage: Secondary | ICD-10-CM | POA: Diagnosis present

## 2022-11-22 DIAGNOSIS — R627 Adult failure to thrive: Secondary | ICD-10-CM | POA: Diagnosis not present

## 2022-11-22 NOTE — Plan of Care (Addendum)
Pt responds to her name. Vitals stable. Foley intact and draining. Pt drank 100% of ensure at hs. Took meds in crushed applesauce. Dsg changed this am to sacrum, hip and right knee.  Problem: Clinical Measurements: Goal: Will remain free from infection Outcome: Progressing Goal: Respiratory complications will improve Outcome: Progressing Goal: Cardiovascular complication will be avoided Outcome: Progressing   Problem: Nutrition: Goal: Adequate nutrition will be maintained Outcome: Progressing

## 2022-11-22 NOTE — Plan of Care (Signed)

## 2022-11-22 NOTE — Progress Notes (Addendum)
Progress Note   Patient: Donna Simmons [id number] DOB: 05/02/1941 DOA: 08/25/2022     88 DOS: the patient was seen and examined on 11/22/2022   Brief hospital course: Patient with h/o Alzheimer's dementia, hypertension, hyperlipidemia, and permanent A-fib on Eliquis admitted on 08/25/22 from SNF with failure to thrive, hypernatremia with a sodium of 167, poor oral intake.  Workup revealed UTI ESBL.  Patient treated with IV Ertapenem.  Currently awaiting long-term placement.  She could not return to SNF because of unpaid bills, family is unable to take care of her at home.  She is unable to qualify for Medicaid currently for financial reasons.   Pt currently with wounds and poor oral intake. Usually non verbal, non mobile. LOG was not accepted and can now not be offered due to Medicaid declining. CSW to consult DTP team to review for further assistance with disposition.   Assessment and Plan: No notes have been filed under this hospital service. Service: Hospitalist  Failure to thrive/difficult to place -Patient with prolonged hospitalization due to FTT -She had a UTI on presentation -She was unable to return to her facility due to financial considerations -Family reports inability to care for her at home -Poor PO intake, severe contractures -There is no safe disposition at this time but she is medically stable when this is addressed  ESBL E. coli UTI -Completed 5 days course of ertapenem   Pressure wounds Pressure Injury 09/04/22 Sacrum Stage 4 - Full thickness tissue loss with exposed bone, tendon or muscle. 3 cms x 3 cms x 1 cm (Active)  09/04/22 1030  Location: Sacrum  Location Orientation:   Staging: Stage 4 - Full thickness tissue loss with exposed bone, tendon or muscle.  Wound Description (Comments): 3 cms x 3 cms x 1 cm  Present on Admission: No     Pressure Injury 09/30/22 Hip Right Unstageable - Full thickness tissue loss in which the base of the injury is covered by  slough (yellow, tan, gray, green or brown) and/or eschar (tan, brown or black) in the wound bed. 2 cms x 1 cm x 0.1 cms 80% red mois (Active)  09/30/22 1200  Location: Hip  Location Orientation: Right  Staging: Unstageable - Full thickness tissue loss in which the base of the injury is covered by slough (yellow, tan, gray, green or brown) and/or eschar (tan, brown or black) in the wound bed.  Wound Description (Comments): 2 cms x 1 cm x 0.1 cms 80% red moist 20% yellow  Present on Admission: No     Pressure Injury 10/20/22 Labia Left Stage 2 -  Partial thickness loss of dermis presenting as a shallow open injury with a red, pink wound bed without slough. Abrasion on left labia (Active)  10/20/22 2017  Location: Labia  Location Orientation: Left  Staging: Stage 2 -  Partial thickness loss of dermis presenting as a shallow open injury with a red, pink wound bed without slough.  Wound Description (Comments): Abrasion on left labia  Present on Admission: No  -Foley catheter inserted on 3/16 to promote wound healing -she is getting hydrotherapy.   -An external urinary catheter is not an option given her severe contraction.   Acute kidney injury Resolved.  Severe malnutrition Nutrition Problem: Severe Malnutrition Etiology: chronic illness Signs/Symptoms: severe muscle depletion, severe fat depletion Interventions: Ensure Enlive (each supplement provides 350kcal and 20 grams of protein), MVI  Chronic afib  -Rate controlled without home Toprol XL -She is on flecainide -Continue Eliquis  Subjective: Patient slept throughout, no interaction.  Withdraws from pain.  Physical Exam: Vitals:   11/21/22 1700 11/21/22 2050 11/22/22 0500 11/22/22 0921  BP: 111/71 106/65 97/67 111/72  Pulse: 67 66 68 69  Resp: 16 15  17   Temp: 98.2 F (36.8 C) 98.4 F (36.9 C) 98.6 F (37 C) 98 F (36.7 C)  TempSrc: Axillary  Oral Oral  SpO2: 99% 99% 99% 95%  Weight:      Height:       General:   Appears  frail, chronically ill Eyes:   normal lids, eyes closed throughout ENT:  grossly normal lips & tongue Neck:  no LAD, masses or thyromegaly Cardiovascular:  RRR, no m/r/g. No LE edema.  Respiratory:   CTA bilaterally with no wheezes/rales/rhonchi.  Normal respiratory effort. Abdomen:  soft, NT, ND Skin:  feet wounds are wrapped with Prevalon boots in place Musculoskeletal:  no bony abnormality, + contractures Psychiatric:  responds only to pain Neurologic: unable to effectively perform   Radiological Exams on Admission: Independently reviewed - see discussion in A/P where applicable  No results found.  EKG: none since 06/30/22   Labs on Admission: I have personally reviewed the available labs and imaging studies at the time of the admission.  Pertinent labs from 3/18:    Glucose 100 WBC 7.4 Hgb 9.6  Family Communication: None present; I was unable to reach her husband by telephone   Disposition: Status is: Inpatient Remains inpatient appropriate because: unsafe disposition  Planned Discharge Destination:  unknown     Time spent: 35 minutes  Author: Karmen Bongo, MD 11/22/2022 4:37 PM  For on call review [email or web address].

## 2022-11-23 DIAGNOSIS — R627 Adult failure to thrive: Secondary | ICD-10-CM | POA: Diagnosis not present

## 2022-11-23 NOTE — Progress Notes (Signed)
Progress Note   Patient: Donna Simmons [id number] DOB: May 23, 1941 DOA: 08/25/2022     89 DOS: the patient was seen and examined on 11/23/2022   Brief hospital course: Patient with h/o Alzheimer's dementia, hypertension, hyperlipidemia, and permanent A-fib on Eliquis admitted on 08/25/22 from SNF with failure to thrive, hypernatremia with a sodium of 167, poor oral intake.  Workup revealed UTI ESBL.  Patient treated with IV Ertapenem.  Currently awaiting long-term placement.  She could not return to SNF because of unpaid bills, family is unable to take care of her at home.  She is unable to qualify for Medicaid currently for financial reasons.   Pt currently with wounds and poor oral intake. Usually non verbal, non mobile. LOG was not accepted and can now not be offered due to Medicaid declining. CSW to consult DTP team to review for further assistance with disposition.   Assessment and Plan:   Failure to thrive/difficult to place -Patient with prolonged hospitalization due to FTT -She had a UTI on presentation -She was unable to return to her facility due to financial considerations -Family reports inability to care for her at home -Poor PO intake, severe contractures -There is no safe disposition at this time but she is medically stable when this is addressed   ESBL E. coli UTI -Completed 5 days course of ertapenem   Pressure wounds -Foley catheter inserted on 3/16 to promote wound healing -she is getting hydrotherapy.   -An external urinary catheter is not an option given her severe contractures.  Pressure Injury 09/04/22 Sacrum Stage 4 - Full thickness tissue loss with exposed bone, tendon or muscle. 3 cms x 3 cms x 1 cm (Active)  09/04/22 1030  Location: Sacrum  Location Orientation:   Staging: Stage 4 - Full thickness tissue loss with exposed bone, tendon or muscle.  Wound Description (Comments): 3 cms x 3 cms x 1 cm  Present on Admission: No     Pressure Injury  09/30/22 Hip Right Unstageable - Full thickness tissue loss in which the base of the injury is covered by slough (yellow, tan, gray, green or brown) and/or eschar (tan, brown or black) in the wound bed. 2 cms x 1 cm x 0.1 cms 80% red mois (Active)  09/30/22 1200  Location: Hip  Location Orientation: Right  Staging: Unstageable - Full thickness tissue loss in which the base of the injury is covered by slough (yellow, tan, gray, green or brown) and/or eschar (tan, brown or black) in the wound bed.  Wound Description (Comments): 2 cms x 1 cm x 0.1 cms 80% red moist 20% yellow  Present on Admission: No     Pressure Injury 10/20/22 Labia Left Stage 2 -  Partial thickness loss of dermis presenting as a shallow open injury with a red, pink wound bed without slough. Abrasion on left labia (Active)  10/20/22 2017  Location: Labia  Location Orientation: Left  Staging: Stage 2 -  Partial thickness loss of dermis presenting as a shallow open injury with a red, pink wound bed without slough.  Wound Description (Comments): Abrasion on left labia  Present on Admission: No     Acute kidney injury Resolved.   Severe malnutrition Nutrition Problem: Severe Malnutrition Etiology: chronic illness Signs/Symptoms: severe muscle depletion, severe fat depletion Interventions: Ensure Enlive (each supplement provides 350kcal and 20 grams of protein), MVI   Chronic afib  -Rate controlled without home Toprol XL -She is on flecainide -Continue Eliquis     Subjective:  The patient was awake today but did not interact or attempt to follow directions.  Her husband was present and reports that she ate pretty well, no new concerns from his standpoint.  He is unable to take her home and is awaiting placement.  Physical Exam: Vitals:   11/22/22 1814 11/22/22 2036 11/23/22 0413 11/23/22 0500  BP:  (!) 101/59 116/79   Pulse:  91 89   Resp:  18 18   Temp: 98.5 F (36.9 C) 98.5 F (36.9 C) 98.3 F (36.8 C)    TempSrc: Oral Axillary Axillary   SpO2:  100% 100%   Weight:    79.8 kg  Height:       General:  Appears  frail, chronically ill Eyes:   normal lids, eyes open but did not track, clear conjunctivae ENT:  grossly normal lips & tongue Neck:  no LAD, masses or thyromegaly Cardiovascular:  RRR, no m/r/g. No LE edema.  Respiratory:   CTA bilaterally with no wheezes/rales/rhonchi.  Normal respiratory effort. Abdomen:  soft, NT, ND Skin:  feet wounds are wrapped with Prevalon boots in place Musculoskeletal:  no bony abnormality, + contractures Psychiatric:  responds only to pain Neurologic: unable to effectively perform   Radiological Exams on Admission: Independently reviewed - see discussion in A/P where applicable  No results found.  EKG: none   Labs on Admission: I have personally reviewed the available labs and imaging studies at the time of the admission.  Pertinent labs:  none since 3/18  Family Communication: Husband was present throughout evaluation  Disposition: Status is: Inpatient Remains inpatient appropriate because: unsafe disposition  Planned Discharge Destination: to be determined    Time spent: 35 minutes  Author: Karmen Bongo, MD 11/23/2022 8:51 AM  For on call review [email or web address].

## 2022-11-24 DIAGNOSIS — R627 Adult failure to thrive: Secondary | ICD-10-CM | POA: Diagnosis not present

## 2022-11-24 LAB — CBC WITH DIFFERENTIAL/PLATELET
Abs Immature Granulocytes: 0 10*3/uL (ref 0.00–0.07)
Basophils Absolute: 0 10*3/uL (ref 0.0–0.1)
Basophils Relative: 0 %
Eosinophils Absolute: 0 10*3/uL (ref 0.0–0.5)
Eosinophils Relative: 0 %
HCT: 32.2 % — ABNORMAL LOW (ref 36.0–46.0)
Hemoglobin: 9.7 g/dL — ABNORMAL LOW (ref 12.0–15.0)
Lymphocytes Relative: 28 %
Lymphs Abs: 1.3 10*3/uL (ref 0.7–4.0)
MCH: 22.4 pg — ABNORMAL LOW (ref 26.0–34.0)
MCHC: 30.1 g/dL (ref 30.0–36.0)
MCV: 74.2 fL — ABNORMAL LOW (ref 80.0–100.0)
Monocytes Absolute: 0.1 10*3/uL (ref 0.1–1.0)
Monocytes Relative: 3 %
Neutro Abs: 3.1 10*3/uL (ref 1.7–7.7)
Neutrophils Relative %: 69 %
Platelets: 348 10*3/uL (ref 150–400)
RBC: 4.34 MIL/uL (ref 3.87–5.11)
RDW: 21.3 % — ABNORMAL HIGH (ref 11.5–15.5)
WBC: 4.5 10*3/uL (ref 4.0–10.5)
nRBC: 0 % (ref 0.0–0.2)
nRBC: 0 /100 WBC

## 2022-11-24 LAB — BASIC METABOLIC PANEL
Anion gap: 7 (ref 5–15)
BUN: 19 mg/dL (ref 8–23)
CO2: 26 mmol/L (ref 22–32)
Calcium: 8.7 mg/dL — ABNORMAL LOW (ref 8.9–10.3)
Chloride: 106 mmol/L (ref 98–111)
Creatinine, Ser: 0.65 mg/dL (ref 0.44–1.00)
GFR, Estimated: >60 mL/min (ref >60)
Glucose, Bld: 86 mg/dL (ref 70–99)
Potassium: 3.8 mmol/L (ref 3.5–5.1)
Sodium: 139 mmol/L (ref 135–145)

## 2022-11-24 NOTE — Progress Notes (Signed)
PROGRESS NOTE    Donna Simmons  [id number] DOB: 15-Nov-1940 DOA: 08/25/2022 PCP: [name], DO   Brief Narrative:  82 yo h/o Alzheimer's dementia, hypertension, hyperlipidemia, and permanent A-fib on Eliquis admitted on 08/25/22 from SNF with failure to thrive, hypernatremia with a sodium of 167, poor oral intake.  Workup revealed UTI ESBL.  Patient treated with IV Ertapenem.  Currently awaiting long-term placement.  She could not return to SNF because of unpaid bills, family is unable to take care of her at home.  She is unable to qualify for Medicaid currently for financial reasons.   Pt currently with wounds and poor oral intake. Usually non verbal, non mobile. LOG was not accepted and can now not be offered due to Medicaid declining. CSW to consult DTP team to review for further assistance with disposition.    Assessment & Plan:  Principal Problem:   Failure to thrive in adult Active Problems:   GERD (gastroesophageal reflux disease)   Hypoalbuminemia due to protein-calorie malnutrition (HCC)   AKI (acute kidney injury) ([place])   Permanent atrial fibrillation (HCC)   Alzheimer's dementia ([place])   Pressure ulcer     Failure to thrive/difficult to place -Patient with prolonged hospitalization due to FTT -She had a UTI on presentation; She was unable to return to her facility due to financial considerations and family unable to take care of her at home.  -Poor PO intake, severe contractures -There is no safe disposition at this time but she is medically stable when this is addressed   ESBL E. coli UTI -Completed 5 days course of ertapenem   Pressure wounds -Foley catheter inserted on 3/16 to promote wound healing -she is getting hydrotherapy.   -An external urinary catheter is not an option given her severe contractures.   Acute kidney injury Resolved.   Severe malnutrition Nutrition Problem: Severe Malnutrition Etiology: chronic illness Signs/Symptoms: severe  muscle depletion, severe fat depletion Interventions: Ensure Enlive (each supplement provides 350kcal and 20 grams of protein), MVI   Chronic afib  -Rate controlled without home Toprol XL -She is on flecainide -Continue Eliquis  Iron Def Microcytosis On PO Iron   DVT prophylaxis: ELiquis Code Status: DNR Family Communication:  Spuse at bedside  Status is: Inpatient Pending safe Dispo.  Nutritional status    Signs/Symptoms: severe muscle depletion, severe fat depletion  Interventions: Ensure Enlive (each supplement provides 350kcal and 20 grams of protein), MVI  Body mass index is 32.19 kg/m.  Pressure Injury 09/04/22 Sacrum Stage 4 - Full thickness tissue loss with exposed bone, tendon or muscle. 3 cms x 3 cms x 1 cm (Active)  09/04/22 1030  Location: Sacrum  Location Orientation:   Staging: Stage 4 - Full thickness tissue loss with exposed bone, tendon or muscle.  Wound Description (Comments): 3 cms x 3 cms x 1 cm  Present on Admission: No     Pressure Injury 09/30/22 Hip Right Unstageable - Full thickness tissue loss in which the base of the injury is covered by slough (yellow, tan, gray, green or brown) and/or eschar (tan, brown or black) in the wound bed. 2 cms x 1 cm x 0.1 cms 80% red mois (Active)  09/30/22 1200  Location: Hip  Location Orientation: Right  Staging: Unstageable - Full thickness tissue loss in which the base of the injury is covered by slough (yellow, tan, gray, green or brown) and/or eschar (tan, brown or black) in the wound bed.  Wound Description (Comments): 2 cms x  1 cm x 0.1 cms 80% red moist 20% yellow  Present on Admission: No     Pressure Injury 10/20/22 Labia Left Stage 2 -  Partial thickness loss of dermis presenting as a shallow open injury with a red, pink wound bed without slough. Abrasion on left labia (Active)  10/20/22 2017  Location: Labia  Location Orientation: Left  Staging: Stage 2 -  Partial thickness loss of dermis presenting  as a shallow open injury with a red, pink wound bed without slough.  Wound Description (Comments): Abrasion on left labia  Present on Admission: No        Subjective: Resting no complaints.    Examination:  Gen exa: very frail. NAD.  Lung: CTAb/l CV: NSR   Objective: Vitals:   11/23/22 0500 11/23/22 1219 11/23/22 1644 11/23/22 2120  BP:  108/76 94/64 96/64   Pulse:  93 81 82  Resp:  20 17 17   Temp:  98.4 F (36.9 C) 97.6 F (36.4 C) 97.6 F (36.4 C)  TempSrc:  Axillary Axillary Axillary  SpO2:  100% 100% 100%  Weight: 79.8 kg     Height:        Intake/Output Summary (Last 24 hours) at 11/24/2022 0820 Last data filed at 11/23/2022 1124 Gross per 24 hour  Intake 120 ml  Output --  Net 120 ml   Filed Weights   11/18/22 0500 11/23/22 0500  Weight: 43 kg 79.8 kg     Data Reviewed:   CBC: Recent Labs  Lab 11/24/22 0617  WBC 4.5  NEUTROABS PENDING  HGB 9.7*  HCT 32.2*  MCV 74.2*  PLT 0000000   Basic Metabolic Panel: Recent Labs  Lab 11/24/22 0617  NA 139  K 3.8  CL 106  CO2 26  GLUCOSE 86  BUN 19  CREATININE 0.65  CALCIUM 8.7*   GFR: Estimated Creatinine Clearance: 55.8 mL/min (by C-G formula based on SCr of 0.65 mg/dL). Liver Function Tests: No results for input(s): "AST", "ALT", "ALKPHOS", "BILITOT", "PROT", "ALBUMIN" in the last 168 hours. No results for input(s): "LIPASE", "AMYLASE" in the last 168 hours. No results for input(s): "AMMONIA" in the last 168 hours. Coagulation Profile: No results for input(s): "INR", "PROTIME" in the last 168 hours. Cardiac Enzymes: No results for input(s): "CKTOTAL", "CKMB", "CKMBINDEX", "TROPONINI" in the last 168 hours. BNP (last 3 results) No results for input(s): "PROBNP" in the last 8760 hours. HbA1C: No results for input(s): "HGBA1C" in the last 72 hours. CBG: No results for input(s): "GLUCAP" in the last 168 hours. Lipid Profile: No results for input(s): "CHOL", "HDL", "LDLCALC", "TRIG",  "CHOLHDL", "LDLDIRECT" in the last 72 hours. Thyroid Function Tests: No results for input(s): "TSH", "T4TOTAL", "FREET4", "T3FREE", "THYROIDAB" in the last 72 hours. Anemia Panel: No results for input(s): "VITAMINB12", "FOLATE", "FERRITIN", "TIBC", "IRON", "RETICCTPCT" in the last 72 hours. Sepsis Labs: No results for input(s): "PROCALCITON", "LATICACIDVEN" in the last 168 hours.  No results found for this or any previous visit (from the past 240 hour(s)).       Radiology Studies: No results found.      Scheduled Meds:  apixaban  5 mg Oral BID   Chlorhexidine Gluconate Cloth  6 each Topical Daily   feeding supplement  237 mL Oral TID BM   ferrous sulfate  325 mg Oral QODAY   flecainide  50 mg Oral BID   leptospermum manuka honey  1 Application Topical Daily   multivitamin with minerals  1 tablet Oral Daily   pantoprazole  40 mg Oral Daily   Continuous Infusions:   LOS: 90 days   Time spent= 35 mins    Arlan Birks Arsenio Loader, MD [facility]  If 7PM-7AM, please contact night-coverage  11/24/2022, 8:20 AM

## 2022-11-24 NOTE — Plan of Care (Signed)

## 2022-11-25 DIAGNOSIS — R627 Adult failure to thrive: Secondary | ICD-10-CM | POA: Diagnosis not present

## 2022-11-25 NOTE — Progress Notes (Signed)
Physical Therapy Wound Treatment Patient Details  Name: Donna Simmons MRN: [id number] Date of Birth: 27-Oct-1940  Today's Date: 11/25/2022 Time: 0920-0940 Time Calculation (min): 20 min  Subjective  Subjective Assessment Subjective: Pt speaking a few words and agreeable to session. Patient and Family Stated Goals: placement in LTACH Date of Onset:  (unknown) Prior Treatments: dressing changes  Pain Score:   Unable to state; no grimacing or indications of pain  Wound Assessment  Pressure Injury 09/30/22 Hip Right Unstageable - Full thickness tissue loss in which the base of the injury is covered by slough (yellow, tan, gray, green or brown) and/or eschar (tan, brown or black) in the wound bed. 2 cms x 1 cm x 0.1 cms 80% red mois (Active)  Wound Image   11/25/22 1004  Dressing Type Foam - Lift dressing to assess site every shift;Gauze (Comment);Honey;Barrier Film (skin prep) 11/25/22 1004  Dressing Changed 11/25/22 1004  Dressing Change Frequency Daily 11/25/22 1004  State of Healing Early/partial granulation 11/25/22 1004  Site / Wound Assessment Granulation tissue;Yellow 11/25/22 1004  % Wound base Red or Granulating 70% 11/25/22 1004  % Wound base Yellow/Fibrinous Exudate 30% 11/25/22 1004  % Wound base Black/Eschar 0% 11/25/22 1004  % Wound base Other/Granulation Tissue (Comment) 0% 11/25/22 1004  Peri-wound Assessment Intact 11/25/22 1004  Wound Length (cm) 3.2 cm 11/25/22 1004  Wound Width (cm) 1.5 cm 11/25/22 1004  Wound Depth (cm) 1.4 cm 11/25/22 1004  Wound Surface Area (cm^2) 4.8 cm^2 11/25/22 1004  Wound Volume (cm^3) 6.72 cm^3 11/25/22 1004  Tunneling (cm) 0 11/18/22 1028  Undermining (cm) 0 11/18/22 1028  Margins Unattached edges (unapproximated) 11/25/22 1004  Drainage Amount Scant 11/25/22 1004  Drainage Description Serous 11/25/22 1004  Treatment Debridement (Selective);Irrigation;Packing (Saline gauze) 11/25/22 1004      Selective Debridement  (non-excisional) Selective Debridement (non-excisional) - Location: R hip Selective Debridement (non-excisional) - Tools Used: Forceps, Scalpel Selective Debridement (non-excisional) - Tissue Removed: yellow necrotic slough    Wound Assessment and Plan  Wound Therapy - Assess/Plan/Recommendations Wound Therapy - Clinical Statement: Pt's wound bed appears to be improving with decreased yellow necrotic slough. Her wound has a small opening, making it difficult at times to remove the unviable tissue. Pt tolderated session well being pre-medicated. This patient will benefit from continued wound therapy for selective removal of unviable tissue, to decrease bioburden, and promote wound bed healing. Wound Therapy - Functional Problem List: unable to weightshift. lacks ability to understand wound treatment process Factors Delaying/Impairing Wound Healing: Immobility, Incontinence, Multiple medical problems Hydrotherapy Plan: Debridement, Dressing change, Patient/family education Wound Therapy - Frequency: Other (comment) (2x/week) Wound Therapy - Follow Up Recommendations: dressing changes by RN  Wound Therapy Goals- Improve the function of patient's integumentary system by progressing the wound(s) through the phases of wound healing (inflammation - proliferation - remodeling) by: Wound Therapy Goals - Improve the function of patient's integumentary system by progressing the wound(s) through the phases of wound healing by: Decrease Necrotic Tissue to: 20% Decrease Necrotic Tissue - Progress: Progressing toward goal Increase Granulation Tissue to: 80% Increase Granulation Tissue - Progress: Progressing toward goal Decrease Length/Width/Depth by (cm): 0.2/0.2/0.5 Decrease Length/Width/Depth - Progress: Progressing toward goal Improve Drainage Characteristics: Min Improve Drainage Characteristics - Progress: Met Goals/treatment plan/discharge plan were made with and agreed upon by patient/family: No,  Patient unable to participate in goals/treatment/discharge plan and family unavailable Time For Goal Achievement: 7 days Wound Therapy - Potential for Goals: Good  Goals will be updated until maximal  potential achieved or discharge criteria met.  Discharge criteria: when goals achieved, discharge from hospital, MD decision/surgical intervention, no progress towards goals, refusal/missing three consecutive treatments without notification or medical reason.  GP     Charges PT Wound Care Charges $Wound Debridement up to 20 cm: < or equal to 20 cm $PT Hydrotherapy Visit: 1 Visit      [place]  Office [phone]   Rexanne Mano 11/25/2022, 10:10 AM

## 2022-11-25 NOTE — Progress Notes (Signed)
Patient arrived to the unit via Bed. VSS. Marland Kitchen Husband is at the bedside. Patient is asleep asleep at this time.

## 2022-11-25 NOTE — Progress Notes (Signed)
PROGRESS NOTE    Donna Simmons  [id number] DOB: [birth year] DOA: 08/25/2022 PCP: [name], DO   Brief Narrative:  82 yo h/o Alzheimer's dementia, hypertension, hyperlipidemia, and permanent A-fib on Eliquis admitted on 08/25/22 from SNF with failure to thrive, hypernatremia with a sodium of 167, poor oral intake.  Workup revealed UTI ESBL.  Patient treated with IV Ertapenem.  Currently awaiting long-term placement.  She could not return to SNF because of unpaid bills, family is unable to take care of her at home.  She is unable to qualify for Medicaid currently for financial reasons.   Pt currently with wounds and poor oral intake. Usually non verbal, non mobile. LOG was not accepted and can now not be offered due to Medicaid declining. CSW to consult DTP team to review for further assistance with disposition.    Assessment & Plan:  Principal Problem:   Failure to thrive in adult Active Problems:   GERD (gastroesophageal reflux disease)   Hypoalbuminemia due to protein-calorie malnutrition (HCC)   AKI (acute kidney injury) ([place])   Permanent atrial fibrillation (HCC)   Alzheimer's dementia ([place])   Pressure ulcer     Failure to thrive/difficult to place -Patient with prolonged hospitalization due to FTT -She had a UTI on presentation; She was unable to return to her facility due to financial considerations and family unable to take care of her at home.  -Poor PO intake, severe contractures -There is no safe disposition at this time but she is medically stable when this is addressed   ESBL E. coli UTI -Completed 5 days course of ertapenem   Pressure wounds -Foley catheter inserted on 3/16 to promote wound healing -she is getting hydrotherapy.   -An external urinary catheter is not an option given her severe contractures.   Acute kidney injury Resolved.   Severe malnutrition Nutrition Problem: Severe Malnutrition Etiology: chronic illness Signs/Symptoms: severe  muscle depletion, severe fat depletion Interventions: Ensure Enlive (each supplement provides 350kcal and 20 grams of protein), MVI   Chronic afib  -Rate controlled without home Toprol XL -She is on flecainide -Continue Eliquis  Iron Def Microcytosis On PO Iron   DVT prophylaxis: ELiquis Code Status: DNR Family Communication: Spouse at bedside  Status is: Inpatient Pending safe Dispo.  Nutritional status    Signs/Symptoms: severe muscle depletion, severe fat depletion  Interventions: Ensure Enlive (each supplement provides 350kcal and 20 grams of protein), MVI  Body mass index is 32.19 kg/m.  Pressure Injury 09/04/22 Sacrum Stage 4 - Full thickness tissue loss with exposed bone, tendon or muscle. 3 cms x 3 cms x 1 cm (Active)  09/04/22 1030  Location: Sacrum  Location Orientation:   Staging: Stage 4 - Full thickness tissue loss with exposed bone, tendon or muscle.  Wound Description (Comments): 3 cms x 3 cms x 1 cm  Present on Admission: No     Pressure Injury 09/30/22 Hip Right Unstageable - Full thickness tissue loss in which the base of the injury is covered by slough (yellow, tan, gray, green or brown) and/or eschar (tan, brown or black) in the wound bed. 2 cms x 1 cm x 0.1 cms 80% red mois (Active)  09/30/22 1200  Location: Hip  Location Orientation: Right  Staging: Unstageable - Full thickness tissue loss in which the base of the injury is covered by slough (yellow, tan, gray, green or brown) and/or eschar (tan, brown or black) in the wound bed.  Wound Description (Comments): 2 cms x 1  cm x 0.1 cms 80% red moist 20% yellow  Present on Admission: No     Pressure Injury 10/20/22 Labia Left Stage 2 -  Partial thickness loss of dermis presenting as a shallow open injury with a red, pink wound bed without slough. Abrasion on left labia (Active)  10/20/22 2017  Location: Labia  Location Orientation: Left  Staging: Stage 2 -  Partial thickness loss of dermis presenting  as a shallow open injury with a red, pink wound bed without slough.  Wound Description (Comments): Abrasion on left labia  Present on Admission: No        Subjective: No complaints Resting comfortably.  Had her dressing changes this morning.  Examination:  Gen exa: very frail. NAD.  Lung: CTAb/l CV: NSR   Objective: Vitals:   11/24/22 0846 11/24/22 1638 11/24/22 2006 11/25/22 0505  BP: (!) 87/51 100/68 96/64 112/73  Pulse: 85 96 89 98  Resp: 15 17 16 16   Temp: 98.1 F (36.7 C) 97.9 F (36.6 C) 98.1 F (36.7 C) 98.2 F (36.8 C)  TempSrc: Oral Axillary Oral Oral  SpO2: 98% 96% 97% 98%  Weight:      Height:        Intake/Output Summary (Last 24 hours) at 11/25/2022 0827 Last data filed at 11/25/2022 0500 Gross per 24 hour  Intake 480 ml  Output 1200 ml  Net -720 ml   Filed Weights   11/18/22 0500 11/23/22 0500  Weight: 43 kg 79.8 kg     Data Reviewed:   CBC: Recent Labs  Lab 11/24/22 0617  WBC 4.5  NEUTROABS 3.1  HGB 9.7*  HCT 32.2*  MCV 74.2*  PLT 0000000   Basic Metabolic Panel: Recent Labs  Lab 11/24/22 0617  NA 139  K 3.8  CL 106  CO2 26  GLUCOSE 86  BUN 19  CREATININE 0.65  CALCIUM 8.7*   GFR: Estimated Creatinine Clearance: 55.8 mL/min (by C-G formula based on SCr of 0.65 mg/dL). Liver Function Tests: No results for input(s): "AST", "ALT", "ALKPHOS", "BILITOT", "PROT", "ALBUMIN" in the last 168 hours. No results for input(s): "LIPASE", "AMYLASE" in the last 168 hours. No results for input(s): "AMMONIA" in the last 168 hours. Coagulation Profile: No results for input(s): "INR", "PROTIME" in the last 168 hours. Cardiac Enzymes: No results for input(s): "CKTOTAL", "CKMB", "CKMBINDEX", "TROPONINI" in the last 168 hours. BNP (last 3 results) No results for input(s): "PROBNP" in the last 8760 hours. HbA1C: No results for input(s): "HGBA1C" in the last 72 hours. CBG: No results for input(s): "GLUCAP" in the last 168 hours. Lipid  Profile: No results for input(s): "CHOL", "HDL", "LDLCALC", "TRIG", "CHOLHDL", "LDLDIRECT" in the last 72 hours. Thyroid Function Tests: No results for input(s): "TSH", "T4TOTAL", "FREET4", "T3FREE", "THYROIDAB" in the last 72 hours. Anemia Panel: No results for input(s): "VITAMINB12", "FOLATE", "FERRITIN", "TIBC", "IRON", "RETICCTPCT" in the last 72 hours. Sepsis Labs: No results for input(s): "PROCALCITON", "LATICACIDVEN" in the last 168 hours.  No results found for this or any previous visit (from the past 240 hour(s)).       Radiology Studies: No results found.      Scheduled Meds:  apixaban  5 mg Oral BID   Chlorhexidine Gluconate Cloth  6 each Topical Daily   feeding supplement  237 mL Oral TID BM   ferrous sulfate  325 mg Oral QODAY   flecainide  50 mg Oral BID   leptospermum manuka honey  1 Application Topical Daily   multivitamin with  minerals  1 tablet Oral Daily   pantoprazole  40 mg Oral Daily   Continuous Infusions:   LOS: 91 days   Time spent= 35 mins    Edrick Whitehorn Arsenio Loader, MD [facility]  If 7PM-7AM, please contact night-coverage  11/25/2022, 8:27 AM

## 2022-11-25 NOTE — Consult Note (Signed)
[place] Nurse wound follow up Wound type: Pressure  Measurement:  Sacrum Stage 4 Pressure Injury 3 cms x 2.5 cms x 1.2 cms with 2.5 cms undermining from 12 to 3 o'clock 100% pink and moist; epibole of wound edges noted  R medial foot full thickness 2 cms x  1 cm x 0.1 100% pink and moist; hypergranulation tissue noted  R great toe full thickness 0.5 cms x 0.5 cms x 0.1 100% pink moist, hypergranulation tissue noted  L lateral foot stage 3 0.5 cms x 0.5 cms x 0.1 cm 100% pink  R trochanter Unstageable 3 cms x 2 cms x 2 cms 60% tan slough 40% pink moist   Drainage (amount, consistency, odor) tan exudate to sacrum and R trochanter; serosanguinous to foot wounds  Periwound: scarring noted around sacral wound  Dressing procedure/placement/frequency:  Sacrum continue silver hydrofiber [id number]  R medial foot, R 5th digit and L lateral foot continue with silicone foam.  R trochanter receiving PT hydrotherapy, continue with Medihoney daily by bedside nurse.    Patient remains on low air loss mattress for pressure redistribution and moisture management. Prevalon boots on at this visit.   [place] nurse team will continue to follow this patient every 7-10 days for wound assessment.    Thank you,    Shelton Silvas MSN, RN-BC, [employer] [phone]

## 2022-11-25 NOTE — Plan of Care (Signed)

## 2022-11-26 DIAGNOSIS — R627 Adult failure to thrive: Secondary | ICD-10-CM | POA: Diagnosis not present

## 2022-11-26 NOTE — TOC Progression Note (Signed)
Transition of Care [facility]) - Progression Note    Patient Details  Name: Donna Simmons MRN: [id number] Date of Birth: [birth year]  Transition of Care [facility] CM/SW Contact  Jinger Neighbors, [place] Phone Number: 11/26/2022, 11:14 AM  Clinical Narrative:     CSW met with pt facce to face at bedside just to say hello. CSW made contact with pt's spouse, Mr. Desa via phone to discuss status of Medicaid spend down. Mr. Pendell reports he was told the property lot they have in [place] is what is putting them over the limit and is willing to sell or rid of the property in order to qualify for Medicaid. CSW encouraged him to f/u with DSS to see what needs to be done with the property. CSW also informed him since he is at the home, if there is no place for pt to d/c to, she will have to return home with him with supports in place.      Barriers to Discharge: Financial Resources, Inadequate or no insurance, No SNF bed, SNF Authorization Denied, SNF Pending Medicaid RWB, Unsafe home situation, SNF Pending payor source - LOG, SNF Pending bed offer, Barriers Unresolved (comment)  Expected Discharge Plan and Services     Post Acute Care Choice: Nursing Home Living arrangements for the past 2 months: [place] (SNF then hospital for 88 days)                                       Social Determinants of Health (SDOH) Interventions SDOH Screenings   Food Insecurity: Patient Declined (10/19/2022)  Alcohol Screen: Low Risk  (04/12/2019)  Depression (PHQ2-9): Low Risk  (11/05/2020)  Tobacco Use: Low Risk  (11/22/2022)    Readmission Risk Interventions     No data to display

## 2022-11-26 NOTE — Progress Notes (Signed)
[facility] PROGRESS NOTE  Donna Simmons (DOB: [birth year]) [id number] PCP: [name], DO  Brief Narrative: 82 yo h/o Alzheimer's dementia, hypertension, hyperlipidemia, and permanent A-fib on Eliquis admitted on 08/25/22 from SNF with failure to thrive, hypernatremia with a sodium of 167, poor oral intake.  Workup revealed UTI ESBL.  Patient treated with IV Ertapenem.  Currently awaiting long-term placement.  She could not return to SNF because of unpaid bills, family is unable to take care of her at home.  She is unable to qualify for Medicaid currently for financial reasons.   Pt currently with wounds and poor oral intake. Usually non verbal, non mobile. LOG was not accepted and can now not be offered due to Medicaid declining. CSW to consult DTP team to review for further assistance with disposition.   Subjective: No complaints, no pain.  Objective: BP 92/67 (BP Location: Right Arm)   Pulse 89   Temp 97.8 F (36.6 C) (Oral)   Resp 18   Ht 5\' 2"  (1.575 m)   Wt 44.9 kg   SpO2 98%   BMI 18.10 kg/m   Gen: Frail elderly female in no acute distress Pulm: Clear, nonlabored  CV: RRR, trace edema GI: Soft, NT, ND, +BS Neuro: Alert, responsive but contracted, not clearly oriented. Ext: Warm, dry with LE spastic contractures Skin: Dressings on wounds are c/d/i without surrounding erythema or any discharge or bleeding.  Assessment & Plan: Principal Problem:   Failure to thrive in adult Active Problems:   GERD (gastroesophageal reflux disease)   Hypoalbuminemia due to protein-calorie malnutrition (HCC)   AKI (acute kidney injury) ([place])   Permanent atrial fibrillation (HCC)   Alzheimer's dementia (HCC)   Pressure ulcer  Failure to thrive/difficult to place: Patient with prolonged hospitalization due to FTT. She had a UTI on presentation; She was unable to return to her facility due to financial considerations and family unable to take care of her at home. Poor PO  intake, severe contractures - There is no safe disposition at this time but she is medically stable when this is addressed   ESBL E. coli UTI: Completed 5 days course of ertapenem. - Contact isolation per protocol   Sacrum Stage 4 Pressure Injury 3 cms x 2.5 cms x 1.2 cms with 2.5 cms undermining from 12 to 3 o'clock 100% pink and moist; epibole of wound edges noted  - Continue silver hydrofiber - Foley catheter inserted on 3/16 to promote wound healing. An external urinary catheter is not an option given her severe contractures.  Pressure injuries R medial foot full thickness 2 cms x  1 cm x 0.1 100% pink and moist; hypergranulation tissue noted. R great toe full thickness 0.5 cms x 0.5 cms x 0.1 100% pink moist, hypergranulation tissue noted. L lateral foot stage 3 0.5 cms x 0.5 cms x 0.1 cm 100% pink  - Continue silicone foam  Pressure injury R trochanter Unstageable 3 cms x 2 cms x 2 cms 60% tan slough 40% pink moist  - Continue hydrotherapy and daily medihoney.    Acute kidney injury: Resolved.   Severe malnutrition - Ensure Enlive (each supplement provides 350kcal and 20 grams of protein), MVM  Chronic afib: Rate controlled.  - Continue metoprolol, flecainide - Continue eliquis   Iron deficiency anemia:  - Continue iron supplementation  Patrecia Pour, MD [facility] [email or web address] 11/26/2022, 11:29 AM

## 2022-11-27 DIAGNOSIS — R627 Adult failure to thrive: Secondary | ICD-10-CM | POA: Diagnosis not present

## 2022-11-27 LAB — CBC
HCT: 33.8 % — ABNORMAL LOW (ref 36.0–46.0)
Hemoglobin: 10.4 g/dL — ABNORMAL LOW (ref 12.0–15.0)
MCH: 22.4 pg — ABNORMAL LOW (ref 26.0–34.0)
MCHC: 30.8 g/dL (ref 30.0–36.0)
MCV: 72.7 fL — ABNORMAL LOW (ref 80.0–100.0)
Platelets: 360 K/uL (ref 150–400)
RBC: 4.65 MIL/uL (ref 3.87–5.11)
RDW: 21.4 % — ABNORMAL HIGH (ref 11.5–15.5)
WBC: 4.8 K/uL (ref 4.0–10.5)
nRBC: 0 % (ref 0.0–0.2)

## 2022-11-27 LAB — COMPREHENSIVE METABOLIC PANEL WITH GFR
ALT: 16 U/L (ref 0–44)
AST: 16 U/L (ref 15–41)
Albumin: 2.5 g/dL — ABNORMAL LOW (ref 3.5–5.0)
Alkaline Phosphatase: 56 U/L (ref 38–126)
Anion gap: 10 (ref 5–15)
BUN: 29 mg/dL — ABNORMAL HIGH (ref 8–23)
CO2: 24 mmol/L (ref 22–32)
Calcium: 8.8 mg/dL — ABNORMAL LOW (ref 8.9–10.3)
Chloride: 110 mmol/L (ref 98–111)
Creatinine, Ser: 0.59 mg/dL (ref 0.44–1.00)
GFR, Estimated: >60 mL/min
Glucose, Bld: 99 mg/dL (ref 70–99)
Potassium: 3.7 mmol/L (ref 3.5–5.1)
Sodium: 144 mmol/L (ref 135–145)
Total Bilirubin: 0.8 mg/dL (ref 0.3–1.2)
Total Protein: 6.7 g/dL (ref 6.5–8.1)

## 2022-11-27 NOTE — TOC Progression Note (Addendum)
Transition of Care [facility]) - Progression Note    Patient Details  Name: Donna Simmons MRN: [id number] Date of Birth: [birth year]  Transition of Care [facility]) CM/SW Contact  Jinger Neighbors, [place] Phone Number: 11/27/2022, 2:28 PM  Clinical Narrative:     CSW received a voicemail from pt's spouse. CSW returned call and [name] reports he received a call from an agent in [place] about selling their land. He is going to sell it and turn it into an annuity. He stated he sent all of the information to [name] at [facility] and has yet to hear back. CSW attempted to call [name], [phone] ([name] is not the name on the vm, but this is the number CSW received from [place]). CSW asked for a returned call.  CSW researched annuities w/ Medicaid. An annuity will help exempt their [place] property, but will then count as income when the annuity starts to pay monthly, which will disqualify them from a financial aspect. Pt reported yesterday he lives in a dilapidated trailer, which would be unsafe for pt to return, and he reports he is disabled. CSW and RNCM staffed pt with attending MD, Dr. Bonner Puna to discuss safe d/c planning. CSW and RNCM, Sharyn Lull called pt to discuss discharge plans. CSW confirmed pt's address, a [facility],  which is not a dilapidated trailer, as pt reported the piror day to [place]. Spouse changed his course of action when CSW confirmed [facility] vs trailer,  then [name] stated he is almost disabled himself and has to go to the hospital and would not have anyone to care for her at that time. CSW/RNCM explained to [name] pt will need to go to LTC or go home with [facility] and [facility], paid out of pocket, until she can qualify for Medicaid. It was also recommended [name] pay the past due balance at [facility] for pt to return. [name] reports the SWKr at [place] did not leave the form as stated. When he finally received the form he appealed insurance three times and won, but when insurance stopped  paying, pt continued to stay without paying.  Cost for care was broken down for spouse and he stated he is not familiar with facilites, but CSW RNCM will assist and f/u with him.     Barriers to Discharge: Financial Resources, Inadequate or no insurance, No SNF bed, SNF Authorization Denied, SNF Pending Medicaid RWB, Unsafe home situation, SNF Pending payor source - LOG, SNF Pending bed offer, Barriers Unresolved (comment)  Expected Discharge Plan and Services     Post Acute Care Choice: Nursing Home Living arrangements for the past 2 months: [place] (SNF then hospital for 88 days)                                       Social Determinants of Health (SDOH) Interventions SDOH Screenings   Food Insecurity: Patient Declined (10/19/2022)  Alcohol Screen: Low Risk  (04/12/2019)  Depression (PHQ2-9): Low Risk  (11/05/2020)  Tobacco Use: Low Risk  (11/22/2022)    Readmission Risk Interventions     No data to display

## 2022-11-27 NOTE — Progress Notes (Signed)
[facility] PROGRESS NOTE  [name] (DOB: [birth year]) [id number] PCP: [name], DO  Brief Narrative: 82 yo h/o Alzheimer's dementia, hypertension, hyperlipidemia, and permanent A-fib on Eliquis admitted on 08/25/22 from SNF with failure to thrive, hypernatremia with a sodium of 167, poor oral intake.  Workup revealed UTI ESBL.  Patient treated with IV Ertapenem.  Currently awaiting long-term placement.  She could not return to SNF because of unpaid bills, family is unable to take care of her at home.  She is unable to qualify for Medicaid currently for financial reasons.   Pt currently with wounds and poor oral intake. Usually non verbal, non mobile. LOG was not accepted and can now not be offered due to Medicaid declining. CSW to consult DTP team to review for further assistance with disposition.   Subjective: No complaints this morning. She is soiled and unaware. Drowsy but does respond to tactile and verbal stimuli.  Objective: BP 105/66 (BP Location: Left Arm)   Pulse 83   Temp 98.1 F (36.7 C) (Oral)   Resp 18   Ht 5\' 2"  (1.575 m)   Wt 44.9 kg   SpO2 97%   BMI 18.10 kg/m   Gen: Elderly frail female in no acute distress Pulm: Clear, nonlabored  CV: RRR, no MRG GI: Soft, NT, ND, +BS Neuro: Drowsy but rousable, not cooperative with full exam. Ext: Warm, stable contractures, decreased muscle bulk. Skin: No new rashes, lesions or ulcers on visualized skin   Assessment & Plan: Principal Problem:   Failure to thrive in adult Active Problems:   GERD (gastroesophageal reflux disease)   Hypoalbuminemia due to protein-calorie malnutrition (HCC)   AKI (acute kidney injury) ([place])   Permanent atrial fibrillation (HCC)   Alzheimer's dementia (HCC)   Pressure ulcer  Failure to thrive/difficult to place: Patient with prolonged hospitalization due to FTT. She had a UTI on presentation; She was unable to return to her facility due to financial considerations and  family unable to take care of her at home. Poor PO intake, severe contractures - There is no safe disposition at this time but she is medically stable when this is addressed - Pt is drowsy, suspect withdrawn delirium. Does have hx UTIs and has catheter in place, will check updated CBC and CMP and continue monitoring. Afebrile (Tmax 99) with no leukocytosis on last check.   ESBL E. coli UTI: Completed 5 days course of ertapenem. - Contact isolation per protocol   Sacrum Stage 4 Pressure Injury 3 cms x 2.5 cms x 1.2 cms with 2.5 cms undermining from 12 to 3 o'clock 100% pink and moist; epibole of wound edges noted  - Continue silver hydrofiber - Foley catheter inserted on 3/16 to promote wound healing. An external urinary catheter is not an option given her severe contractures.  Pressure injuries R medial foot full thickness 2 cms x  1 cm x 0.1 100% pink and moist; hypergranulation tissue noted. R great toe full thickness 0.5 cms x 0.5 cms x 0.1 100% pink moist, hypergranulation tissue noted. L lateral foot stage 3 0.5 cms x 0.5 cms x 0.1 cm 100% pink  - Continue silicone foam  Pressure injury R trochanter Unstageable 3 cms x 2 cms x 2 cms 60% tan slough 40% pink moist  - Continue hydrotherapy and daily medihoney.    Acute kidney injury: Resolved.   Severe malnutrition - Ensure Enlive (each supplement provides 350kcal and 20 grams of protein), MVM  Chronic afib: Rate controlled.  -  Continue metoprolol, flecainide - Continue eliquis   Iron deficiency anemia:  - Continue iron supplementation  Patrecia Pour, MD [facility] [email or web address] 11/27/2022, 9:21 AM

## 2022-11-27 NOTE — TOC Progression Note (Addendum)
Transition of Care [facility]) - Progression Note    Patient Details  Name: Donna Simmons MRN: [id number] Date of Birth: [birth year]  Transition of Care [facility]) CM/SW Contact  Curlene Labrum, RN Phone Number: 11/27/2022, 2:26 PM  Clinical Narrative:    CM called and spoke with the patient's husband on the phone along with Ronnald Ramp, LCSW to discuss safe discharge options for the patient.  The patient's husband states that patient was admitted to [facility] and husband states that [facility] stopped insurance authorization for STR at the facility even though he appealed the decision three time.   I discuss patient's options with the husband and he states that he has health issues and is unable to care for the patient at the home and would prefer to pay out of pocket for LTC at a nursing home facility.  I explained to the patient that [facility] Team will explore options for LTC placement and that the patient would need to use his financial resources available to pay for her care and the husband was agreeable.  The patient's husband lives in a single story home in [place] and has available assets to pay for the patient's  care at the facility.  The patient plans to also sell land that he owns to assist with payment for her care.  11/27/22 - 1450 - CM called and spoke with [place], [place] at [facility] and the facility has available LTC beds and will review the patient's clinicals for possible bed offer.  The patient was made aware in the conversation on the phone that he would need to pay out of pocket to spend down his resources and he was in agreement.  The cost at [facility] for [facility] is 8000/month starting fee.  Ronnald Ramp, LCSW will follow up regarding LTC placement at this time.     Barriers to Discharge: Financial Resources, Inadequate or no insurance, No SNF bed, SNF Authorization Denied, SNF Pending Medicaid RWB, Unsafe home situation, SNF Pending payor source - LOG, SNF Pending bed offer,  Barriers Unresolved (comment)  Expected Discharge Plan and Services     Post Acute Care Choice: Nursing Home Living arrangements for the past 2 months: [place] (SNF then hospital for 88 days)                                       Social Determinants of Health (SDOH) Interventions SDOH Screenings   Food Insecurity: Patient Declined (10/19/2022)  Alcohol Screen: Low Risk  (04/12/2019)  Depression (PHQ2-9): Low Risk  (11/05/2020)  Tobacco Use: Low Risk  (11/22/2022)    Readmission Risk Interventions     No data to display

## 2022-11-27 NOTE — Plan of Care (Signed)

## 2022-11-28 DIAGNOSIS — R627 Adult failure to thrive: Secondary | ICD-10-CM | POA: Diagnosis not present

## 2022-11-28 MED ORDER — SODIUM CHLORIDE 0.9 % IV SOLN: 1.0000 g | INTRAVENOUS | Status: DC

## 2022-11-28 NOTE — Progress Notes (Signed)
Physical Therapy Wound Treatment Patient Details  Name: Donna Simmons MRN: [id number] Date of Birth: 1941/05/07  Today's Date: 11/28/2022 Time: [id number] Time Calculation (min): 46 min  Subjective  Subjective Assessment Subjective: Pt speaking a few words and agreeable to session. Patient and Family Stated Goals: placement in LTACH Date of Onset:  (unknown) Prior Treatments: dressing changes  Pain Score:  Pt tolerated well with intermittent pain without being pre-medicated  Wound Assessment  Pressure Injury 09/30/22 Hip Right Unstageable - Full thickness tissue loss in which the base of the injury is covered by slough (yellow, tan, gray, green or brown) and/or eschar (tan, brown or black) in the wound bed. 2 cms x 1 cm x 0.1 cms 80% red mois (Active)  Wound Image   11/25/22 1004  Dressing Type Foam - Lift dressing to assess site every shift;Gauze (Comment);Honey;Barrier Film (skin prep);Normal saline moist dressing 11/28/22 1634  Dressing Changed 11/28/22 1634  Dressing Change Frequency Daily 11/28/22 1634  State of Healing Early/partial granulation 11/28/22 1634  Site / Wound Assessment Granulation tissue;Yellow 11/28/22 1634  % Wound base Red or Granulating 70% 11/28/22 1634  % Wound base Yellow/Fibrinous Exudate 30% 11/28/22 1634  % Wound base Black/Eschar 0% 11/28/22 1634  % Wound base Other/Granulation Tissue (Comment) 0% 11/28/22 1634  Peri-wound Assessment Intact 11/28/22 1634  Wound Length (cm) 3.2 cm 11/25/22 1004  Wound Width (cm) 1.5 cm 11/25/22 1004  Wound Depth (cm) 2 cm 11/27/22 1100  Wound Surface Area (cm^2) 4.8 cm^2 11/25/22 1004  Wound Volume (cm^3) 6.72 cm^3 11/25/22 1004  Tunneling (cm) 0 11/18/22 1028  Undermining (cm) 0 11/18/22 1028  Margins Unattached edges (unapproximated) 11/28/22 1634  Drainage Amount Minimal 11/28/22 1634  Drainage Description Serous 11/28/22 1634  Treatment Debridement (Selective);Irrigation;Packing (Saline gauze) 11/28/22 1634    Selective Debridement (non-excisional) Selective Debridement (non-excisional) - Location: R hip Selective Debridement (non-excisional) - Tools Used: Forceps, Scalpel Selective Debridement (non-excisional) - Tissue Removed: yellow necrotic slough and necrotic adipose tissue    Wound Assessment and Plan  Wound Therapy - Assess/Plan/Recommendations Wound Therapy - Clinical Statement: Pt's wound bed appears to be gradually improving. Her wound has a small opening, making it difficult at times to remove the unviable tissue. However, a decent amount of necrotic yellow slough and adipose tissue was able to be successfully removed, exposing a pink wound bed in some areas. Pt tolderated session well with only intermittent pain without being pre-medicated. This patient will benefit from continued wound therapy for selective removal of unviable tissue, to decrease bioburden, and promote wound bed healing. Wound Therapy - Functional Problem List: unable to weightshift. lacks ability to understand wound treatment process Factors Delaying/Impairing Wound Healing: Immobility, Incontinence, Multiple medical problems Hydrotherapy Plan: Debridement, Dressing change, Patient/family education Wound Therapy - Frequency: Other (comment) (2x/week) Wound Therapy - Follow Up Recommendations: dressing changes by RN  Wound Therapy Goals- Improve the function of patient's integumentary system by progressing the wound(s) through the phases of wound healing (inflammation - proliferation - remodeling) by: Wound Therapy Goals - Improve the function of patient's integumentary system by progressing the wound(s) through the phases of wound healing by: Decrease Necrotic Tissue to: 20% Decrease Necrotic Tissue - Progress: Progressing toward goal Increase Granulation Tissue to: 80% Increase Granulation Tissue - Progress: Progressing toward goal Decrease Length/Width/Depth by (cm): 0.2/0.2/0.5 Decrease Length/Width/Depth -  Progress: Progressing toward goal Improve Drainage Characteristics: Min Improve Drainage Characteristics - Progress: Progressing toward goal Goals/treatment plan/discharge plan were made with and agreed upon by patient/family:  Yes (with husband) Time For Goal Achievement: 7 days Wound Therapy - Potential for Goals: Good  Goals will be updated until maximal potential achieved or discharge criteria met.  Discharge criteria: when goals achieved, discharge from hospital, MD decision/surgical intervention, no progress towards goals, refusal/missing three consecutive treatments without notification or medical reason.  GP     Charges PT Wound Care Charges $Wound Debridement up to 20 cm: < or equal to 20 cm $PT Hydrotherapy Dressing: 2 dressings $PT Hydrotherapy Visit: 1 Visit   Moishe Spice, PT, DPT Acute Rehabilitation Services  Office: [phone]       Orvan Falconer 11/28/2022, 4:40 PM

## 2022-11-28 NOTE — Progress Notes (Signed)
[facility] PROGRESS NOTE  Donna Simmons (DOB: 03-08-1941) [id number] PCP: [name], DO  Brief Narrative: 82 yo h/o Alzheimer's dementia, hypertension, hyperlipidemia, and permanent A-fib on Eliquis admitted on 08/25/22 from SNF with failure to thrive, hypernatremia with a sodium of 167, poor oral intake.  Workup revealed UTI ESBL.  Patient treated with IV Ertapenem.  Currently awaiting long-term placement.  She could not return to SNF because of unpaid bills, family is unable to take care of her at home.  She is unable to qualify for Medicaid currently for financial reasons.   Pt currently with wounds and poor oral intake. Usually non verbal, non mobile. LOG was not accepted and can now not be offered due to Medicaid declining. CSW to consult DTP team to review for further assistance with disposition.   Subjective: No complaints and no overnight events. Her husband of 70 years is at the bedside this morning assisting with breakfast. 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 Eventually returned near where she grew up locally here to work at [employer].   Objective: BP 128/80 (BP Location: Left Arm)   Pulse (!) 102   Temp 98.4 F (36.9 C)   Resp 14   Ht 5\' 2"  (1.575 m)   Wt 42.6 kg   SpO2 97%   BMI 17.19 kg/m   Gen: Frail elderly thin female in no distress Pulm: Clear, nonlabored  CV: RRR, no edema GI: Soft, NT, ND, +BS  Neuro: Responsive, not oriented, keeps eyes closed mostly. Ext: Warm, dry. Contractures stable Skin: No new rashes, lesions or ulcers on visualized skin   Assessment & Plan: Principal Problem:   Failure to thrive in adult Active Problems:   GERD (gastroesophageal reflux disease)   Hypoalbuminemia due to protein-calorie malnutrition (HCC)   AKI (acute kidney injury) ([place])   Permanent atrial fibrillation (HCC)   Alzheimer's dementia (HCC)   Pressure ulcer  Failure to  thrive/difficult to place, advanced Alzheimer's dementia: Patient with prolonged hospitalization due to FTT. She had a UTI on presentation; She was unable to return to her facility due to financial considerations and family unable to take care of her at home. Poor PO intake, severe contractures - There is no safe disposition at this time but she is medically stable when this is addressed - Mental status remains stable.    ESBL E. coli UTI: Completed 5 days course of ertapenem. - Contact isolation per protocol   Sacrum Stage 4 Pressure Injury 3 cms x 2.5 cms x 1.2 cms with 2.5 cms undermining from 12 to 3 o'clock 100% pink and moist; epibole of wound edges noted  - Continue silver hydrofiber - Foley catheter inserted on 3/16 to promote wound healing. An external urinary catheter is not an option given her severe contractures.  Pressure injuries R medial foot full thickness 2 cms x  1 cm x 0.1 100% pink and moist; hypergranulation tissue noted. R great toe full thickness 0.5 cms x 0.5 cms x 0.1 100% pink moist, hypergranulation tissue noted. L lateral foot stage 3 0.5 cms x 0.5 cms x 0.1 cm 100% pink  - Continue silicone foam  Pressure injury R trochanter Unstageable 3 cms x 2 cms x 2 cms 60% tan slough 40% pink moist  - Continue hydrotherapy and daily medihoney.    Acute kidney injury: Resolved.   Severe malnutrition - Ensure Enlive (each supplement  provides 350kcal and 20 grams of protein), MVM  Chronic afib: Rate controlled.  - Continue metoprolol, flecainide - Continue eliquis   Iron deficiency anemia:  - Continue iron supplementation  Patrecia Pour, MD [facility] [email or web address] 11/28/2022, 11:54 AM

## 2022-11-29 DIAGNOSIS — R627 Adult failure to thrive: Secondary | ICD-10-CM | POA: Diagnosis not present

## 2022-11-29 LAB — BASIC METABOLIC PANEL
Anion gap: 9 (ref 5–15)
Anion gap: 12 (ref 5–15)
BUN: 36 mg/dL — ABNORMAL HIGH (ref 8–23)
BUN: 36 mg/dL — ABNORMAL HIGH (ref 8–23)
CO2: 30 mmol/L (ref 22–32)
CO2: 24 mmol/L (ref 22–32)
Calcium: 9.2 mg/dL (ref 8.9–10.3)
Calcium: 9.1 mg/dL (ref 8.9–10.3)
Chloride: 111 mmol/L (ref 98–111)
Chloride: 109 mmol/L (ref 98–111)
Creatinine, Ser: 0.58 mg/dL (ref 0.44–1.00)
Creatinine, Ser: 0.61 mg/dL (ref 0.44–1.00)
GFR, Estimated: >60 mL/min (ref >60)
GFR, Estimated: >60 mL/min (ref >60)
Glucose, Bld: 106 mg/dL — ABNORMAL HIGH (ref 70–99)
Glucose, Bld: 98 mg/dL (ref 70–99)
Potassium: 3.6 mmol/L (ref 3.5–5.1)
Potassium: 3.6 mmol/L (ref 3.5–5.1)
Sodium: 150 mmol/L — ABNORMAL HIGH (ref 135–145)
Sodium: 145 mmol/L (ref 135–145)

## 2022-11-29 LAB — URINALYSIS, ROUTINE W REFLEX MICROSCOPIC
Bilirubin Urine: NEGATIVE
Cellular Cast, UA: 26
Glucose, UA: NEGATIVE mg/dL
Ketones, ur: NEGATIVE mg/dL
Nitrite: NEGATIVE
Protein, ur: >=300 mg/dL — AB
RBC / HPF: >50 RBC/hpf (ref 0–5)
Specific Gravity, Urine: 1.028 (ref 1.005–1.030)
WBC, UA: >50 WBC/hpf (ref 0–5)
pH: 5 (ref 5.0–8.0)

## 2022-11-29 LAB — CBC
HCT: 37.1 % (ref 36.0–46.0)
Hemoglobin: 10.9 g/dL — ABNORMAL LOW (ref 12.0–15.0)
MCH: 22.2 pg — ABNORMAL LOW (ref 26.0–34.0)
MCHC: 29.4 g/dL — ABNORMAL LOW (ref 30.0–36.0)
MCV: 75.4 fL — ABNORMAL LOW (ref 80.0–100.0)
Platelets: 379 10*3/uL (ref 150–400)
RBC: 4.92 MIL/uL (ref 3.87–5.11)
RDW: 22.2 % — ABNORMAL HIGH (ref 11.5–15.5)
WBC: 7.5 10*3/uL (ref 4.0–10.5)
nRBC: 0 % (ref 0.0–0.2)

## 2022-11-29 MED ORDER — ATROPINE SULFATE 1 MG/ML IV SOLN: 0.4000 mg | INTRAVENOUS | Status: DC | PRN

## 2022-11-29 MED ORDER — DEXTROSE-NACL 5-0.45 % IV SOLN: INTRAVENOUS | Status: DC

## 2022-11-29 NOTE — Progress Notes (Signed)
Sacrum wound dressing changed this morning, CHG bath and foley care provided.

## 2022-11-29 NOTE — Plan of Care (Signed)
Patient alert but disoriented X 4, VSS throughout shift.  All meds given on time as ordered.  Diminished lungs, IS encouraged.  Foley care provided and full bath given.  UA lab collected and sent.  Pt sleeping in bed.  POC maintained, will continue to monitor.  Problem: Education: Goal: Knowledge of General Education information will improve Description: Including pain rating scale, medication(s)/side effects and non-pharmacologic comfort measures Outcome: Progressing   Problem: Health Behavior/Discharge Planning: Goal: Ability to manage health-related needs will improve Outcome: Progressing   Problem: Clinical Measurements: Goal: Ability to maintain clinical measurements within normal limits will improve Outcome: Progressing Goal: Will remain free from infection Outcome: Progressing Goal: Diagnostic test results will improve Outcome: Progressing Goal: Respiratory complications will improve Outcome: Progressing Goal: Cardiovascular complication will be avoided Outcome: Progressing   Problem: Activity: Goal: Risk for activity intolerance will decrease Outcome: Progressing   Problem: Nutrition: Goal: Adequate nutrition will be maintained Outcome: Progressing   Problem: Coping: Goal: Level of anxiety will decrease Outcome: Progressing   Problem: Elimination: Goal: Will not experience complications related to bowel motility Outcome: Progressing Goal: Will not experience complications related to urinary retention Outcome: Progressing   Problem: Pain Managment: Goal: General experience of comfort will improve Outcome: Progressing   Problem: Safety: Goal: Ability to remain free from injury will improve Outcome: Progressing   Problem: Skin Integrity: Goal: Risk for impaired skin integrity will decrease Outcome: Progressing

## 2022-11-29 NOTE — Progress Notes (Addendum)
[facility] PROGRESS NOTE  SUJATA TARTAGLIONE (DOB: Apr 18, 1941) [id number] PCP: [name], DO  Brief Narrative: 82 yo h/o Alzheimer's dementia, hypertension, hyperlipidemia, and permanent A-fib on Eliquis admitted on 08/25/22 from SNF with failure to thrive, hypernatremia with a sodium of 167, poor oral intake.  Workup revealed UTI ESBL.  Patient treated with IV Ertapenem.  Currently awaiting long-term placement.  She could not return to SNF because of unpaid bills, family is unable to take care of her at home.  She is unable to qualify for Medicaid currently for financial reasons.   Pt currently with wounds and poor oral intake. Usually non verbal, non mobile. LOG was not accepted and can now not be offered due to Medicaid declining. CSW to consult DTP team to review for further assistance with disposition.   Subjective: No complaints. Developed gross hematuria yesterday, urine now dark without red blood. Pt denies any new pain.  Objective: BP 108/71   Pulse 89   Temp 97.8 F (36.6 C)   Resp 16   Ht 5\' 2"  (1.575 m)   Wt 42.6 kg   SpO2 100%   BMI 17.19 kg/m   Gen: Frail elderly female in no distress Pulm: Clear, nonlabored  CV: RRR, no edema GI: Soft, NT, ND, +BS, scaphoid Neuro: Resting quietly but awakens. Very weak diffusely, no focal deficits noted. Not oriented. Ext: Warm, spastic contractures stable Skin: No new rashes, lesions or ulcers on visualized skin   Assessment & Plan: Principal Problem:   Failure to thrive in adult Active Problems:   GERD (gastroesophageal reflux disease)   Hypoalbuminemia due to protein-calorie malnutrition (HCC)   AKI (acute kidney injury) ([place])   Permanent atrial fibrillation (HCC)   Alzheimer's dementia (HCC)   Pressure ulcer  Failure to thrive/difficult to place, advanced Alzheimer's dementia: Patient with prolonged hospitalization due to FTT. She had a UTI on presentation; She was unable to return to her facility due to  financial considerations and family unable to take care of her at home. Poor PO intake, severe contractures - There is no safe disposition at this time but she is medically stable when this is addressed - Mental status remains stable.    ESBL E. coli UTI: Completed 5 days course of ertapenem. - Contact isolation per protocol   Hematuria: Transient. Hgb stable at 10.4g/dl, no red blood noted any longer and having normal foley output, WBC 7.5.  - Holding eliquis for now - No other evidence of UTI at this time, will monitor off abx.  - Check BMP (pending this AM), flush catheter prn low output. May need some IV fluids. BMP updated, hypernatremia and rising BUN:Cr. Suspect less po intake, will give 1/2NS and recheck to confirm improvement/stability this PM and again in AM.  Sacrum Stage 4 Pressure Injury 3 cms x 2.5 cms x 1.2 cms with 2.5 cms undermining from 12 to 3 o'clock 100% pink and moist; epibole of wound edges noted  - Continue silver hydrofiber - Foley catheter inserted on 3/16 to promote wound healing. An external urinary catheter is not an option given her severe contractures.  Pressure injuries R medial foot full thickness 2 cms x  1 cm x 0.1 100% pink and moist; hypergranulation tissue noted. R great toe full thickness 0.5 cms x 0.5 cms x 0.1 100% pink moist, hypergranulation tissue noted. L lateral foot stage 3 0.5 cms x 0.5 cms x 0.1 cm 100% pink  - Continue silicone foam  Pressure injury R trochanter  Unstageable 3 cms x 2 cms x 2 cms 60% tan slough 40% pink moist  - Continue hydrotherapy and daily medihoney.    Acute kidney injury: Resolved.   Severe malnutrition - Ensure Enlive (each supplement provides 350kcal and 20 grams of protein), MVM  Chronic afib: Rate controlled.  - Continue metoprolol, flecainide - Continue eliquis   Iron deficiency anemia:  - Continue iron supplementation  Patrecia Pour, MD [facility] [email or web address] 11/29/2022, 10:21 AM

## 2022-11-30 DIAGNOSIS — R627 Adult failure to thrive: Secondary | ICD-10-CM | POA: Diagnosis not present

## 2022-11-30 LAB — BASIC METABOLIC PANEL
Anion gap: 10 (ref 5–15)
BUN: 32 mg/dL — ABNORMAL HIGH (ref 8–23)
CO2: 24 mmol/L (ref 22–32)
Calcium: 8.9 mg/dL (ref 8.9–10.3)
Chloride: 111 mmol/L (ref 98–111)
Creatinine, Ser: 0.57 mg/dL (ref 0.44–1.00)
GFR, Estimated: >60 mL/min (ref >60)
Glucose, Bld: 120 mg/dL — ABNORMAL HIGH (ref 70–99)
Potassium: 3.5 mmol/L (ref 3.5–5.1)
Sodium: 145 mmol/L (ref 135–145)

## 2022-11-30 NOTE — Progress Notes (Signed)
[facility] PROGRESS NOTE  Donna Simmons (DOB: 1941/07/30) [id number] PCP: [name], DO  Brief Narrative: 82 yo h/o Alzheimer's dementia, hypertension, hyperlipidemia, and permanent A-fib on Eliquis admitted on 08/25/22 from SNF with failure to thrive, hypernatremia with a sodium of 167, poor oral intake.  Workup revealed UTI ESBL.  Patient treated with IV Ertapenem.  Currently awaiting long-term placement.  She could not return to SNF because of unpaid bills, family is unable to take care of her at home.  She is unable to qualify for Medicaid currently for financial reasons.   Pt currently with wounds and poor oral intake. Usually non verbal, non mobile. LOG was not accepted and can now not be offered due to Medicaid declining. CSW to consult DTP team to review for further assistance with disposition.   Subjective: Spouse at bedside, she's taking po without complaint or vomiting. No bleeding, urine is much lighter now.   Objective: BP 116/73 (BP Location: Left Arm)   Pulse 96   Temp (!) 97.5 F (36.4 C)   Resp 18   Ht 5\' 2"  (1.575 m)   Wt 42.6 kg   SpO2 100%   BMI 17.19 kg/m   Gen: No distress, frail Pulm: Clear, nonlabored  CV: RRR, no pitting edema GI: Soft, NT, ND, +BS. Neuro: Alert not oriented, not verbal for me today. Ext: Warm, stable contractures Skin: No new rashes, lesions or ulcers on visualized skin   Assessment & Plan: Principal Problem:   Failure to thrive in adult Active Problems:   GERD (gastroesophageal reflux disease)   Hypoalbuminemia due to protein-calorie malnutrition (HCC)   AKI (acute kidney injury) ([place])   Permanent atrial fibrillation (HCC)   Alzheimer's dementia (HCC)   Pressure ulcer  Failure to thrive/difficult to place, advanced Alzheimer's dementia: Patient with prolonged hospitalization due to FTT. She had a UTI on presentation; She was unable to return to her facility due to financial considerations and family unable to take  care of her at home. Poor PO intake, severe contractures - There is no safe disposition at this time but she is medically stable when this is addressed - Mental status remains stable.    ESBL E. coli UTI: Completed 5 days course of ertapenem. - Contact isolation per protocol   Hematuria: Transient. Hgb stable at 10.4g/dl, no red blood noted any longer and having normal foley output, WBC 7.5.  - Holding eliquis for now, d/w spouse. May restart 4/1. - No other evidence of UTI at this time, will monitor off abx.   Hypernatremia: Inadequate free water intake. Resolved at goal rate with hypotonic saline.  - Encourage enteral free water. Monitor BMP again in AM, stop IVF.   Sacrum Stage 4 Pressure Injury 3 cms x 2.5 cms x 1.2 cms with 2.5 cms undermining from 12 to 3 o'clock 100% pink and moist; epibole of wound edges noted  - Continue silver hydrofiber - Foley catheter inserted on 3/16 to promote wound healing. An external urinary catheter is not an option given her severe contractures.  Pressure injuries R medial foot full thickness 2 cms x  1 cm x 0.1 100% pink and moist; hypergranulation tissue noted. R great toe full thickness 0.5 cms x 0.5 cms x 0.1 100% pink moist, hypergranulation tissue noted. L lateral foot stage 3 0.5 cms x 0.5 cms x 0.1 cm 100% pink  - Continue silicone foam  Pressure injury R trochanter Unstageable 3 cms x 2 cms x 2 cms 60% tan  slough 40% pink moist  - Continue hydrotherapy and daily medihoney.    Acute kidney injury: Resolved.   Severe malnutrition - Ensure Enlive (each supplement provides 350kcal and 20 grams of protein), MVM  Chronic afib: Rate controlled.  - Continue metoprolol, flecainide - Continue eliquis   Iron deficiency anemia:  - Continue iron supplementation  Patrecia Pour, MD [facility] [email or web address] 11/30/2022, 1:51 PM

## 2022-11-30 NOTE — Plan of Care (Signed)
Patient alert but disoriented X 4, VSS throughout shift. All meds given on time as ordered. Diminished lungs, IS encouraged. Foley care provided and full bath given. Tylenol given for pain.  Pt sleeping in bed. POC maintained, will continue to monitor.   Problem: Education: Goal: Knowledge of General Education information will improve Description: Including pain rating scale, medication(s)/side effects and non-pharmacologic comfort measures Outcome: Progressing   Problem: Health Behavior/Discharge Planning: Goal: Ability to manage health-related needs will improve Outcome: Progressing   Problem: Clinical Measurements: Goal: Ability to maintain clinical measurements within normal limits will improve Outcome: Progressing Goal: Will remain free from infection Outcome: Progressing Goal: Diagnostic test results will improve Outcome: Progressing Goal: Respiratory complications will improve Outcome: Progressing Goal: Cardiovascular complication will be avoided Outcome: Progressing   Problem: Activity: Goal: Risk for activity intolerance will decrease Outcome: Progressing   Problem: Nutrition: Goal: Adequate nutrition will be maintained Outcome: Progressing   Problem: Coping: Goal: Level of anxiety will decrease Outcome: Progressing   Problem: Elimination: Goal: Will not experience complications related to bowel motility Outcome: Progressing Goal: Will not experience complications related to urinary retention Outcome: Progressing   Problem: Pain Managment: Goal: General experience of comfort will improve Outcome: Progressing   Problem: Safety: Goal: Ability to remain free from injury will improve Outcome: Progressing   Problem: Skin Integrity: Goal: Risk for impaired skin integrity will decrease Outcome: Progressing

## 2022-12-01 DIAGNOSIS — R627 Adult failure to thrive: Secondary | ICD-10-CM | POA: Diagnosis not present

## 2022-12-01 LAB — CBC WITH DIFFERENTIAL/PLATELET
Abs Immature Granulocytes: 0.03 10*3/uL (ref 0.00–0.07)
Basophils Absolute: 0 10*3/uL (ref 0.0–0.1)
Basophils Relative: 0 %
Eosinophils Absolute: 0.1 10*3/uL (ref 0.0–0.5)
Eosinophils Relative: 1 %
HCT: 32.6 % — ABNORMAL LOW (ref 36.0–46.0)
Hemoglobin: 9.7 g/dL — ABNORMAL LOW (ref 12.0–15.0)
Immature Granulocytes: 0 %
Lymphocytes Relative: 17 %
Lymphs Abs: 1.2 10*3/uL (ref 0.7–4.0)
MCH: 22.2 pg — ABNORMAL LOW (ref 26.0–34.0)
MCHC: 29.8 g/dL — ABNORMAL LOW (ref 30.0–36.0)
MCV: 74.6 fL — ABNORMAL LOW (ref 80.0–100.0)
Monocytes Absolute: 0.4 10*3/uL (ref 0.1–1.0)
Monocytes Relative: 6 %
Neutro Abs: 5.4 10*3/uL (ref 1.7–7.7)
Neutrophils Relative %: 76 %
Platelets: 287 10*3/uL (ref 150–400)
RBC: 4.37 MIL/uL (ref 3.87–5.11)
RDW: 21.3 % — ABNORMAL HIGH (ref 11.5–15.5)
Smear Review: NORMAL
WBC: 7.1 10*3/uL (ref 4.0–10.5)
nRBC: 0 % (ref 0.0–0.2)

## 2022-12-01 LAB — BASIC METABOLIC PANEL
Anion gap: 12 (ref 5–15)
BUN: 24 mg/dL — ABNORMAL HIGH (ref 8–23)
CO2: 26 mmol/L (ref 22–32)
Calcium: 8.7 mg/dL — ABNORMAL LOW (ref 8.9–10.3)
Chloride: 106 mmol/L (ref 98–111)
Creatinine, Ser: 0.53 mg/dL (ref 0.44–1.00)
GFR, Estimated: >60 mL/min (ref >60)
Glucose, Bld: 92 mg/dL (ref 70–99)
Potassium: 3.2 mmol/L — ABNORMAL LOW (ref 3.5–5.1)
Sodium: 144 mmol/L (ref 135–145)

## 2022-12-01 MED ORDER — POTASSIUM CHLORIDE CRYS ER 20 MEQ PO TBCR
40.0000 meq | EXTENDED_RELEASE_TABLET | Freq: Once | ORAL | Status: AC
  Administered 2022-12-01: 40 meq via ORAL
  Filled 2022-12-01: qty 2

## 2022-12-01 NOTE — TOC Progression Note (Signed)
Transition of Care [facility]) - Progression Note    Patient Details  Name: ARETHA DORNAK MRN: [id number] Date of Birth: [birth year]  [facility]) CM/SW Contact  Jinger Neighbors, [place] Phone Number: 12/01/2022, 3:50 PM  Clinical Narrative:     CSW called pt's spouse to discuss and review the three bed offers for long term care. Mr. Gane reports he will research facilities and f/u with CSW in the morning. CSW will notify the SNF and ask them to reach out to spouse to move forward.     Barriers to Discharge: Financial Resources, Inadequate or no insurance, No SNF bed, SNF Authorization Denied, SNF Pending Medicaid RWB, Unsafe home situation, SNF Pending payor source - LOG, SNF Pending bed offer, Barriers Unresolved (comment)  Expected Discharge Plan and Services     Post Acute Care Choice: Nursing Home Living arrangements for the past 2 months: [employer] (SNF then hospital for 88 days)                                       Social Determinants of Health (SDOH) Interventions SDOH Screenings   Food Insecurity: Patient Declined (10/19/2022)  Alcohol Screen: Low Risk  (04/12/2019)  Depression (PHQ2-9): Low Risk  (11/05/2020)  Tobacco Use: Low Risk  (11/22/2022)    Readmission Risk Interventions     No data to display

## 2022-12-01 NOTE — Progress Notes (Signed)
[facility] PROGRESS NOTE  Donna Simmons (DOB: 06-Feb-1941) [id number] PCP: [name], DO  Brief Narrative: 82 yo h/o Alzheimer's dementia, hypertension, hyperlipidemia, and permanent A-fib on Eliquis admitted on 08/25/22 from SNF with failure to thrive, hypernatremia with a sodium of 167, poor oral intake.  Workup revealed UTI ESBL.  Patient treated with IV Ertapenem.  Currently awaiting long-term placement.  She could not return to SNF because of unpaid bills, family is unable to take care of her at home.  She is unable to qualify for Medicaid currently for financial reasons.   Pt currently with wounds and poor oral intake. Usually non verbal, non mobile. LOG was not accepted and can now not be offered due to Medicaid declining. CSW to consult DTP team to review for further assistance with disposition.   Subjective: No new complaints or overnight events.  Objective: BP 95/64 (BP Location: Right Arm)   Pulse 93   Temp 98.5 F (36.9 C) (Oral)   Resp 18   Ht 5\' 2"  (1.575 m)   Wt 42.6 kg   SpO2 97%   BMI 17.19 kg/m   Gen: No distress Pulm: Clear, nonlabored  CV: RRR GI: Soft, NT, ND, +BS Neuro: Disoriented, stable contractures.    Assessment & Plan: Principal Problem:   Failure to thrive in adult Active Problems:   GERD (gastroesophageal reflux disease)   Hypoalbuminemia due to protein-calorie malnutrition   AKI (acute kidney injury)   Permanent atrial fibrillation   Alzheimer's dementia   Pressure ulcer  Failure to thrive/difficult to place, advanced Alzheimer's dementia: Patient with prolonged hospitalization due to FTT. She had a UTI on presentation; She was unable to return to her facility due to financial considerations and family unable to take care of her at home. Poor PO intake, severe contractures - There is no safe disposition at this time but she is medically stable when this is addressed - Mental status remains stable.    ESBL E. coli UTI:  Completed 5 days course of ertapenem. - Contact isolation per protocol   Hematuria: Transient. Hgb stable at 10.4g/dl, no red blood noted any longer and having normal foley output, WBC 7.5.  - Holding eliquis for now, d/w spouse. May restart 4/1. - No other evidence of UTI at this time, remains afebrile with normal appearing urine output today and normal WBC. Will monitor off abx.   Hypernatremia: Inadequate free water intake. Resolved at goal rate with hypotonic saline.  - Encourage enteral free water. Na 144 without IVF support.    Sacrum Stage 4 Pressure Injury 3 cms x 2.5 cms x 1.2 cms with 2.5 cms undermining from 12 to 3 o'clock 100% pink and moist; epibole of wound edges noted  - Continue silver hydrofiber - Foley catheter inserted on 3/16 to promote wound healing. An external urinary catheter is not an option given her severe contractures.  Pressure injuries R medial foot full thickness 2 cms x  1 cm x 0.1 100% pink and moist; hypergranulation tissue noted. R great toe full thickness 0.5 cms x 0.5 cms x 0.1 100% pink moist, hypergranulation tissue noted. L lateral foot stage 3 0.5 cms x 0.5 cms x 0.1 cm 100% pink  - Continue silicone foam  Pressure injury R trochanter Unstageable 3 cms x 2 cms x 2 cms 60% tan slough 40% pink moist  - Continue hydrotherapy and daily medihoney.    Acute kidney injury: Resolved.   Severe malnutrition - Ensure Enlive (each supplement provides  350kcal and 20 grams of protein), MVM  Chronic afib: Rate controlled.  - Continue metoprolol, flecainide - Continue eliquis   Iron deficiency anemia:  - Continue iron supplementation  Patrecia Pour, MD [facility] [email or web address] 12/01/2022, 9:33 AM

## 2022-12-01 NOTE — Plan of Care (Signed)
Patient alert but disoriented X 4, VSS throughout shift. All meds given on time as ordered. Diminished lungs, IS encouraged. Foley care provided and bowel movement documented.  Sacrum dressing changed.  Pt sleeping in bed. POC maintained, will continue to monitor.  Problem: Education: Goal: Knowledge of General Education information will improve Description: Including pain rating scale, medication(s)/side effects and non-pharmacologic comfort measures Outcome: Progressing   Problem: Health Behavior/Discharge Planning: Goal: Ability to manage health-related needs will improve Outcome: Progressing   Problem: Clinical Measurements: Goal: Ability to maintain clinical measurements within normal limits will improve Outcome: Progressing Goal: Will remain free from infection Outcome: Progressing Goal: Diagnostic test results will improve Outcome: Progressing Goal: Respiratory complications will improve Outcome: Progressing Goal: Cardiovascular complication will be avoided Outcome: Progressing   Problem: Activity: Goal: Risk for activity intolerance will decrease Outcome: Progressing   Problem: Nutrition: Goal: Adequate nutrition will be maintained Outcome: Progressing   Problem: Coping: Goal: Level of anxiety will decrease Outcome: Progressing   Problem: Elimination: Goal: Will not experience complications related to bowel motility Outcome: Progressing Goal: Will not experience complications related to urinary retention Outcome: Progressing   Problem: Pain Managment: Goal: General experience of comfort will improve Outcome: Progressing   Problem: Safety: Goal: Ability to remain free from injury will improve Outcome: Progressing   Problem: Skin Integrity: Goal: Risk for impaired skin integrity will decrease Outcome: Progressing

## 2022-12-02 DIAGNOSIS — R627 Adult failure to thrive: Secondary | ICD-10-CM | POA: Diagnosis not present

## 2022-12-02 MED ORDER — MEDIHONEY WOUND/BURN DRESSING EX PSTE
1.0000 | PASTE | Freq: Every day | CUTANEOUS | Status: DC
  Administered 2022-12-02 – 2022-12-10 (×8): 1 via TOPICAL
  Filled 2022-12-02: qty 44

## 2022-12-02 NOTE — Plan of Care (Signed)
Patient alert and oriented to self only, VSS throughout shift. All meds given on time as ordered. Diminished lungs, IS encouraged. Foley care provided and bowel movement documented.  Pt ate 100% of dinner and desserts last night. Pt sleeping in bed. POC maintained, will continue to monitor.  Problem: Education: Goal: Knowledge of General Education information will improve Description: Including pain rating scale, medication(s)/side effects and non-pharmacologic comfort measures Outcome: Progressing   Problem: Health Behavior/Discharge Planning: Goal: Ability to manage health-related needs will improve Outcome: Progressing   Problem: Clinical Measurements: Goal: Ability to maintain clinical measurements within normal limits will improve Outcome: Progressing Goal: Will remain free from infection Outcome: Progressing Goal: Diagnostic test results will improve Outcome: Progressing Goal: Respiratory complications will improve Outcome: Progressing Goal: Cardiovascular complication will be avoided Outcome: Progressing   Problem: Activity: Goal: Risk for activity intolerance will decrease Outcome: Progressing   Problem: Nutrition: Goal: Adequate nutrition will be maintained Outcome: Progressing   Problem: Coping: Goal: Level of anxiety will decrease Outcome: Progressing   Problem: Elimination: Goal: Will not experience complications related to bowel motility Outcome: Progressing Goal: Will not experience complications related to urinary retention Outcome: Progressing   Problem: Pain Managment: Goal: General experience of comfort will improve Outcome: Progressing   Problem: Safety: Goal: Ability to remain free from injury will improve Outcome: Progressing   Problem: Skin Integrity: Goal: Risk for impaired skin integrity will decrease Outcome: Progressing

## 2022-12-02 NOTE — Consult Note (Addendum)
[facility] Nurse wound follow up Wound type: Pressure  Measurement:  Sacrum Stage 4 Pressure Injury 3 cms x 3 cms x 1 cms with 2 cms undermining from 6 o'clock to 12 o'clock, 80% pink moist 20% dark devitalized tissue; epibole of wound edges noted  R medial foot full thickness 2 cms x 2 cms x 0.1 cms 75% brown devitalized tissue 25% pink moist  R great toe full thickness 0.3 cms x 0.3 cms x 0.1 cm 100% pink and moist  L lateral foot Stage 3 Pressure Injury 1 cm x 1 cm x 0.1 cm 100% pink and moist  R trochanter Unstageable Pressure Injury 2 cms x 2.5 cms x 3 cms 50% pink and moist 50% yellow fibrin and subcutaneous tissue   Drainage (amount, consistency, odor) tan exudate to sacrum and R trochanter, minimal serosanguinous all other wounds  Periwound: some scarring and maceration noted surrounding sacral wound, all others intact  Dressing procedure/placement/frequency:  Sacrum continue Silver hydrofiber Kellie Simmering [phone]) R trochanter using Q tip applicator fill wound with  moist to dry saline gauze twice daily, top with dry gauze and cover with silicone foam.  Change foam dressing q3 days and prn soiling.   R medial foot clean with NS, apply Medihoney to wound bed daily, cover with dry gauze and foam dressing.  May lift foam to reapply Medihoney.  Change foam dressing q3 days and prn soiling.  All other wounds to feet apply silicone foams, change q 3days and prn soiling.  Patient remains on low air loss mattress for pressure redistribution and moisture management.  Prevalon boots in room at time of todays visit.   [facility] nurse team will continue to follow this patient every 7-10 days for wound assessment.   Thank you,    Shelton Silvas MSN, RN-BC, [employer] [phone]

## 2022-12-02 NOTE — Progress Notes (Signed)
Physical Therapy Wound Treatment and Discharge Patient Details  Name: TATYANA MENDIVIL MRN: [id number] Date of Birth: 07/30/1941  Today's Date: 12/02/2022 Time: 0907-0929 Time Calculation (min): 22 min  Subjective  Subjective Assessment Subjective: no verbalizations today Patient and Family Stated Goals: placement in LTACH Date of Onset:  (unknown) Prior Treatments: dressing changes  Pain Score:   no displays of pain; sleeping throughout session  Wound Assessment  Pressure Injury 09/30/22 Hip Right Unstageable - Full thickness tissue loss in which the base of the injury is covered by slough (yellow, tan, gray, green or brown) and/or eschar (tan, brown or black) in the wound bed. 2 cms x 1 cm x 0.1 cms 80% red mois (Active)  Wound Image   12/02/22 0934  Dressing Type Gauze (Comment);Moist to dry;Normal saline moist dressing;Foam - Lift dressing to assess site every shift 12/02/22 0934  Dressing Changed 12/02/22 0934  Dressing Change Frequency Daily 12/02/22 0934  State of Healing Early/partial granulation 12/02/22 0934  Site / Wound Assessment Granulation tissue 12/02/22 0934  % Wound base Red or Granulating 70% 12/02/22 0934  % Wound base Yellow/Fibrinous Exudate 30% 12/02/22 0934  % Wound base Black/Eschar 0% 12/02/22 0934  % Wound base Other/Granulation Tissue (Comment) 0% 12/02/22 0934  Peri-wound Assessment Intact 12/02/22 0934  Wound Length (cm) 2.8 cm 12/02/22 0934  Wound Width (cm) 1.8 cm 12/02/22 0934  Wound Depth (cm) 2.3 cm 12/02/22 0934  Wound Surface Area (cm^2) 5.04 cm^2 12/02/22 0934  Wound Volume (cm^3) 11.59 cm^3 12/02/22 0934  Tunneling (cm) 0 11/18/22 1028  Undermining (cm) 0 11/18/22 1028  Margins Unattached edges (unapproximated) 12/02/22 0934  Drainage Amount Scant 12/02/22 0934  Drainage Description Serosanguineous 12/02/22 0934  Treatment Debridement (Selective);Irrigation;Packing (Saline gauze) 12/02/22 0934      Selective Debridement  (non-excisional) Selective Debridement (non-excisional) - Location: R hip Selective Debridement (non-excisional) - Tools Used: Forceps, Scalpel Selective Debridement (non-excisional) - Tissue Removed: yellow necrotic slough and necrotic adipose tissue    Wound Assessment and Plan  Wound Therapy - Assess/Plan/Recommendations Wound Therapy - Clinical Statement: Patient's wound bed without signs of overt infection. Depth of wound has increased as necrotic tissue has been removed (as would be expected). Necrotic tissue primarily at base of wound and difficult to debride due to narrow opening. Will discharge from hydrotherapy due to inability to adequately debride area. WOC, RN informed and has changed dressing order to wet to dry to help with mechanical debridement. Wound Therapy - Functional Problem List: unable to weightshift. lacks ability to understand wound treatment process Factors Delaying/Impairing Wound Healing: Immobility, Incontinence, Multiple medical problems Hydrotherapy Plan: Other (comment) (Discharge) Wound Therapy - Frequency: Other (comment) (2x/week) Wound Therapy - Follow Up Recommendations: dressing changes by RN  Wound Therapy Goals- Improve the function of patient's integumentary system by progressing the wound(s) through the phases of wound healing (inflammation - proliferation - remodeling) by: Wound Therapy Goals - Improve the function of patient's integumentary system by progressing the wound(s) through the phases of wound healing by: Decrease Necrotic Tissue to: 20% Decrease Necrotic Tissue - Progress: Partly met Increase Granulation Tissue to: 80% Increase Granulation Tissue - Progress: Partly met Decrease Length/Width/Depth by (cm): 0.2/0.2/0.5 Decrease Length/Width/Depth - Progress: Not met Improve Drainage Characteristics: Min Improve Drainage Characteristics - Progress: Met Goals/treatment plan/discharge plan were made with and agreed upon by patient/family:  Yes (with husband) Time For Goal Achievement: 7 days Wound Therapy - Potential for Goals: Good  Goals will be updated until maximal potential achieved or  discharge criteria met.  Discharge criteria: when goals achieved, discharge from hospital, MD decision/surgical intervention, no progress towards goals, refusal/missing three consecutive treatments without notification or medical reason.  GP     Charges PT Wound Care Charges $Wound Debridement up to 20 cm: < or equal to 20 cm $PT Hydrotherapy Visit: 1 Visit      [place]  Office [phone]   Rexanne Mano 12/02/2022, 9:46 AM

## 2022-12-02 NOTE — TOC Progression Note (Addendum)
Transition of Care [facility]) - Progression Note    Patient Details  Name: Donna Simmons MRN: [id number] Date of Birth: 1941-05-23  Transition of Care [facility]) CM/SW Contact  Curlene Labrum, RN Phone Number: 12/02/2022, 9:18 AM  Clinical Narrative:    CM met with the patient's husband at the bedside to discuss Medicare choice regarding LTC placement at a nursing facility.  The patient's husband chose [place].  I called and left a message with [facility] to follow up with the patient's husband regarding admission documents.     Barriers to Discharge: Financial Resources, Inadequate or no insurance, No SNF bed, SNF Authorization Denied, SNF Pending Medicaid RWB, Unsafe home situation, SNF Pending payor source - LOG, SNF Pending bed offer, Barriers Unresolved (comment)  Expected Discharge Plan and Services     Post Acute Care Choice: Nursing Home Living arrangements for the past 2 months: [place] (SNF then hospital for 88 days)                                       Social Determinants of Health (SDOH) Interventions SDOH Screenings   Food Insecurity: Patient Declined (10/19/2022)  Alcohol Screen: Low Risk  (04/12/2019)  Depression (PHQ2-9): Low Risk  (11/05/2020)  Tobacco Use: Low Risk  (11/22/2022)    Readmission Risk Interventions     No data to display

## 2022-12-02 NOTE — Progress Notes (Signed)
[facility] PROGRESS NOTE  Donna Simmons (DOB: 1941-07-09) [id number] PCP: [name], DO  Brief Narrative: 82 yo h/o Alzheimer's dementia, hypertension, hyperlipidemia, and permanent A-fib on Eliquis admitted on 08/25/22 from SNF with failure to thrive, hypernatremia with a sodium of 167, poor oral intake.  Workup revealed UTI ESBL.  Patient treated with IV Ertapenem.  Currently awaiting long-term placement.  She could not return to SNF because of unpaid bills, family is unable to take care of her at home.  She is unable to qualify for Medicaid currently for financial reasons.   Pt currently with wounds and poor oral intake. Usually non verbal, non mobile. LOG was not accepted and can now not be offered due to Medicaid declining. CSW to consult DTP team to review for further assistance with disposition.   Subjective: No complaints or overnight events. Husband at bedside. Ate 100% dinner last night.  Objective: BP (!) 114/92 (BP Location: Right Arm)   Pulse (!) 102   Temp 99.2 F (37.3 C)   Resp 16   Ht 5\' 2"  (1.575 m)   Wt 44.5 kg   SpO2 98%   BMI 17.92 kg/m   Gen: No distress Pulm: Clear, nonlabored  CV: RRR, no MRG or pitting edema GI: Soft, NT, ND, +BS  GU: Foley with clear yellow urine. Neuro: Disoriented. No new focal deficits.   Assessment & Plan: Principal Problem:   Failure to thrive in adult Active Problems:   GERD (gastroesophageal reflux disease)   Hypoalbuminemia due to protein-calorie malnutrition   AKI (acute kidney injury)   Permanent atrial fibrillation   Alzheimer's dementia   Pressure ulcer  Failure to thrive/difficult to place, advanced Alzheimer's dementia: Patient with prolonged hospitalization due to FTT. She had a UTI on presentation; She was unable to return to her facility due to financial considerations and family unable to take care of her at home. Poor PO intake, severe contractures - There is no safe disposition at this time but  she is medically stable when this is addressed - Mental status remains stable.    ESBL E. coli UTI: Completed 5 days course of ertapenem. - Contact isolation per protocol   Hematuria: Transient. Hgb stable at 10.4g/dl, no red blood noted any longer and having normal foley output, WBC 7.5.  - Held eliquis with quick resolution. Since hematuria resolved, we will restart anticoagulation 4/2. - No other evidence of UTI at this time, remains afebrile with normal appearing urine output today and normal WBC. Will monitor off abx.   Hypernatremia: Inadequate free water intake. Resolved at goal rate with hypotonic saline.  - Encourage enteral free water. Na 144 without IVF support. Recheck later this week.  Sacrum Stage 4 Pressure Injury 3 cms x 2.5 cms x 1.2 cms with 2.5 cms undermining from 12 to 3 o'clock 100% pink and moist; epibole of wound edges noted  - Continue silver hydrofiber - Foley catheter inserted on 3/16 to promote wound healing. An external urinary catheter is not an option given her severe contractures.  Pressure injuries R medial foot full thickness 2 cms x  1 cm x 0.1 100% pink and moist; hypergranulation tissue noted. R great toe full thickness 0.5 cms x 0.5 cms x 0.1 100% pink moist, hypergranulation tissue noted. L lateral foot stage 3 0.5 cms x 0.5 cms x 0.1 cm 100% pink  - Continue silicone foam  Pressure injury R trochanter Unstageable 3 cms x 2 cms x 2 cms 60% tan slough  40% pink moist  - Continue hydrotherapy and daily medihoney.    Acute kidney injury: Resolved.   Severe malnutrition - Ensure Enlive (each supplement provides 350kcal and 20 grams of protein), MVM  Chronic afib: Rate controlled.  - Continue metoprolol, flecainide - Continue eliquis   Iron deficiency anemia:  - Continue iron supplementation  Patrecia Pour, MD [facility] [email or web address] 12/02/2022, 8:55 AM

## 2022-12-03 DIAGNOSIS — R627 Adult failure to thrive: Secondary | ICD-10-CM | POA: Diagnosis not present

## 2022-12-03 LAB — BASIC METABOLIC PANEL
Anion gap: 11 (ref 5–15)
BUN: 32 mg/dL — ABNORMAL HIGH (ref 8–23)
CO2: 24 mmol/L (ref 22–32)
Calcium: 8.6 mg/dL — ABNORMAL LOW (ref 8.9–10.3)
Chloride: 116 mmol/L — ABNORMAL HIGH (ref 98–111)
Creatinine, Ser: 0.63 mg/dL (ref 0.44–1.00)
GFR, Estimated: >60 mL/min (ref >60)
Glucose, Bld: 112 mg/dL — ABNORMAL HIGH (ref 70–99)
Potassium: 3.6 mmol/L (ref 3.5–5.1)
Sodium: 151 mmol/L — ABNORMAL HIGH (ref 135–145)

## 2022-12-03 MED ORDER — APIXABAN 2.5 MG PO TABS
2.5000 mg | ORAL_TABLET | Freq: Two times a day (BID) | ORAL | Status: DC
  Administered 2022-12-03 – 2022-12-19 (×32): 2.5 mg via ORAL
  Filled 2022-12-03 (×32): qty 1

## 2022-12-03 NOTE — Progress Notes (Addendum)
[facility] PROGRESS NOTE  Donna Simmons (DOB: Oct 04, 1940) [id number] PCP: [name], DO  Brief Narrative: 82 yo h/o Alzheimer's dementia, hypertension, hyperlipidemia, and permanent A-fib on Eliquis admitted on 08/25/22 from SNF with failure to thrive, hypernatremia with a sodium of 167, poor oral intake.  Workup revealed UTI ESBL.  Patient treated with IV Ertapenem.  Currently awaiting long-term placement.  She could not return to SNF because of unpaid bills, family is unable to take care of her at home.  She is unable to qualify for Medicaid currently for financial reasons.   Pt currently with wounds and poor oral intake. Usually non verbal, non mobile. LOG was not accepted and can now not be offered due to Medicaid declining. CSW to consult DTP team to review for further assistance with disposition.   Subjective: No change overnight, oral intake is good, no fever, vomiting, pain complaints, change in urinary habits.  Objective: BP 103/67 (BP Location: Right Arm)   Pulse (!) 105   Temp 98.1 F (36.7 C) (Oral)   Resp 15   Ht 5\' 2"  (1.575 m)   Wt 44.5 kg   SpO2 96%   BMI 17.92 kg/m   Frail elderly female, lying in bed, can limbs are emaciated and contractured She does not respond to my presence, or make any spontaneous verbalizations, she cooperates with nursing cares Tachycardic, regular, no murmurs, no peripheral edema Respiratory rate seems normal, lungs clear without rales or wheezes Abdomen no grimace to palpation     Assessment & Plan: Principal Problem:   Failure to thrive in adult Active Problems:   GERD (gastroesophageal reflux disease)   Hypoalbuminemia due to protein-calorie malnutrition   AKI (acute kidney injury)   Permanent atrial fibrillation   Alzheimer's dementia   Pressure ulcer  Failure to thrive Alzheimer's No change   Hypernatremia Sodium is fluctuated in the range 140-150 during her hospital stay.  Sodium up to 151 this  morning - Push oral fluids  Chronic atrial fibrillation Notes from yesterday suggest Eliquis was restarted, but it was not.  - Continue flecainide - Hold metoprolol - Resume apixaban   Edwin Dada, MD [facility] [email or web address] 12/03/2022, 3:58 PM

## 2022-12-03 NOTE — TOC Progression Note (Signed)
Transition of Care [facility]) - Progression Note    Patient Details  Name: Donna Simmons MRN: [id number] Date of Birth: 1941/02/03  Transition of Care [facility]) CM/SW [place], RN Phone Number: 12/03/2022, 1:59 PM  Clinical Narrative:    CM met with the patient and husband at the bedside to discuss patient's discharge to the nursing facility [facility].  I spoke with the facility and they left a voicemail message with the patient requesting husband meet with the business office for payment.  I called the facility on the phone while I was in the hospital room and the husband agreed to meet with the business office in the am to make payment arrangements.  I updated the attending physician for likely discharge to the facility tomorrow once husband meet with the business office.      Barriers to Discharge: Financial Resources, Inadequate or no insurance, No SNF bed, SNF Authorization Denied, SNF Pending Medicaid RWB, Unsafe home situation, SNF Pending payor source - LOG, SNF Pending bed offer, Barriers Unresolved (comment)  Expected Discharge Plan and Services     Post Acute Care Choice: Nursing Home Living arrangements for the past 2 months: [place] (SNF then hospital for 88 days)                                       Social Determinants of Health (SDOH) Interventions SDOH Screenings   Food Insecurity: Patient Declined (10/19/2022)  Alcohol Screen: Low Risk  (04/12/2019)  Depression (PHQ2-9): Low Risk  (11/05/2020)  Tobacco Use: Low Risk  (11/22/2022)    Readmission Risk Interventions     No data to display

## 2022-12-03 NOTE — Care Plan (Signed)
Patient able to say her first name, unable to answer any other orientation questions. Husband at bedside almost the whole day, helping patient eat and drink. I encouraged husband to offer liquids frequently per DR Danford's request. Patient had 3 Ensure Plus during the day and ate 40% of her meals. Per husband, he couldn't feed her more because patient was falling asleep. I changed all the dressings and helped with cleaning the patient twice. Patient uncomfortable wen was turned in bed, BLE being very contracted. Q2hrs rounds provided by [employer].

## 2022-12-04 DIAGNOSIS — R627 Adult failure to thrive: Secondary | ICD-10-CM | POA: Diagnosis not present

## 2022-12-04 LAB — BASIC METABOLIC PANEL
Anion gap: 12 (ref 5–15)
BUN: 34 mg/dL — ABNORMAL HIGH (ref 8–23)
CO2: 24 mmol/L (ref 22–32)
Calcium: 8.7 mg/dL — ABNORMAL LOW (ref 8.9–10.3)
Chloride: 114 mmol/L — ABNORMAL HIGH (ref 98–111)
Creatinine, Ser: 0.64 mg/dL (ref 0.44–1.00)
GFR, Estimated: >60 mL/min (ref >60)
Glucose, Bld: 109 mg/dL — ABNORMAL HIGH (ref 70–99)
Potassium: 3.3 mmol/L — ABNORMAL LOW (ref 3.5–5.1)
Sodium: 150 mmol/L — ABNORMAL HIGH (ref 135–145)

## 2022-12-04 MED ORDER — DEXTROSE 5 % IV SOLN: INTRAVENOUS | Status: DC

## 2022-12-04 MED ORDER — POTASSIUM CL IN DEXTROSE 5% 20 MEQ/L IV SOLN
20.0000 meq | INTRAVENOUS | Status: DC
  Administered 2022-12-04 – 2022-12-05 (×2): 20 meq via INTRAVENOUS
  Filled 2022-12-04 (×3): qty 1000

## 2022-12-04 NOTE — TOC Progression Note (Addendum)
Transition of Care [facility]) - Progression Note    Patient Details  Name: MAZAL SEDENO MRN: [id number] Date of Birth: Jun 20, 1941  Transition of Care [facility]) CM/SW [place], RN Phone Number: 12/04/2022, 10:01 AM  Clinical Narrative:    CM called and spoke with CM at [facility] this morning and the facility has retracted their bed offer.  I met with the patient at the bedside and the patient's husband was not present at the bedside to update.  I called and left a voicemail message with the husband to updated that [facility] has declined bedoffer and discuss other LTC bed offers in the the hub.  12/04/22 1341 - I called [facility] and spoke with Narda Rutherford, CM with facility and she states that she spoke with the patient's husband this morning and she currently does not have any semi-private beds available at this time for placement.    Barriers to Discharge: Financial Resources, Inadequate or no insurance, No SNF bed, SNF Authorization Denied, SNF Pending Medicaid RWB, Unsafe home situation, SNF Pending payor source - LOG, SNF Pending bed offer, Barriers Unresolved (comment)  Expected Discharge Plan and Services     Post Acute Care Choice: Nursing Home Living arrangements for the past 2 months: [place] (SNF then hospital for 88 days)                                       Social Determinants of Health (SDOH) Interventions SDOH Screenings   Food Insecurity: Patient Declined (10/19/2022)  Alcohol Screen: Low Risk  (04/12/2019)  Depression (PHQ2-9): Low Risk  (11/05/2020)  Tobacco Use: Low Risk  (11/22/2022)    Readmission Risk Interventions     No data to display

## 2022-12-04 NOTE — Progress Notes (Signed)
[facility] PROGRESS NOTE  Donna Simmons (DOB: 25-May-1941) [id number] PCP: [name], DO  Brief Narrative: 82 yo F with advanced Alzheimer's HTN, Afib on Eliquis who presented with decreased responsiveness, found to have Na 167 and ESBL UTI.     Subjective: Family report oral intake is at baseline over the last 2 months, she has had no fever, restlessness, change in urinary habits.  Nursing of no concerns.  Objective: BP 104/71 (BP Location: Right Arm)   Pulse 96   Temp 98 F (36.7 C) (Oral)   Resp 17   Ht 5\' 2"  (1.575 m)   Wt 44.5 kg   SpO2 93%   BMI 17.92 kg/m   Frail elderly female, lying in bed, limbs are emaciated and contractured Tachycardic, regular, no murmurs, no peripheral edema, no JVD Respiratory effort shallow, lung sounds diminished, no rales or wheezes appreciated No grimace to palpation of the abdomen, which is soft and nondistended She does not make eye contact, she makes no spontaneous verbalizations, she does not appear to be aware of my presence      Assessment & Plan: Principal Problem:   Failure to thrive in adult Active Problems:   GERD (gastroesophageal reflux disease)   Hypoalbuminemia due to protein-calorie malnutrition   AKI (acute kidney injury)   Permanent atrial fibrillation   Alzheimer's dementia   Pressure ulcer  Failure to thrive Alzheimer's End-stage   Hypernatremia Sodium elevated again.  Looking back over the last 3 months, she appears to have had hypernatremia occurring every few weeks, usually treated with IV fluids.  This is clearly part of her end stage Alzheimer's and should inform family's goals of care. -Resume hypotonic fluids - Trend sodium - Reconsult palliative care   Chronic atrial fibrillation - Continue apixaban, flecainide  - Hold metoprolol   Edwin Dada, MD [facility] [email or web address] 12/04/2022, 4:43 PM

## 2022-12-05 DIAGNOSIS — Z515 Encounter for palliative care: Secondary | ICD-10-CM | POA: Diagnosis not present

## 2022-12-05 DIAGNOSIS — Z7189 Other specified counseling: Secondary | ICD-10-CM | POA: Diagnosis not present

## 2022-12-05 DIAGNOSIS — R627 Adult failure to thrive: Secondary | ICD-10-CM | POA: Diagnosis not present

## 2022-12-05 LAB — BASIC METABOLIC PANEL
Anion gap: 11 (ref 5–15)
BUN: 23 mg/dL (ref 8–23)
CO2: 24 mmol/L (ref 22–32)
Calcium: 8.4 mg/dL — ABNORMAL LOW (ref 8.9–10.3)
Chloride: 107 mmol/L (ref 98–111)
Creatinine, Ser: 0.53 mg/dL (ref 0.44–1.00)
GFR, Estimated: >60 mL/min (ref >60)
Glucose, Bld: 107 mg/dL — ABNORMAL HIGH (ref 70–99)
Potassium: 3.6 mmol/L (ref 3.5–5.1)
Sodium: 142 mmol/L (ref 135–145)

## 2022-12-05 NOTE — Progress Notes (Signed)
[facility] PROGRESS NOTE  Donna Simmons (DOB: 10-Feb-1941) [id number] PCP: Yolanda Manges, DO  Brief Narrative: 82 yo F with advanced Alzheimer's HTN, Afib on Eliquis who presented with decreased responsiveness, found to have Na 167 and ESBL UTI.     Subjective: No significant change overnight, no nursing concerns, no fever, no respiratory distress, no improvement in mentation.  Objective: BP 105/64 (BP Location: Right Arm)   Pulse 88   Temp 98.2 F (36.8 C) (Oral)   Resp 18   Ht 5\' 2"  (1.575 m)   Wt 40.8 kg   SpO2 98%   BMI 16.46 kg/m   For elderly female, lying in bed, opens eyes, gives one-word answer to how are you, otherwise does not follow commands or answer any other questions Regular rate, regular rhythm, no murmurs, no peripheral edema, no JVD Respiratory effort shallow, lung sounds diminished, no rales or wheezes appreciated No grimace to palpation of the abdomen, which is soft and nondistended        Assessment & Plan: Principal Problem:   Failure to thrive in adult Active Problems:   GERD (gastroesophageal reflux disease)   Hypoalbuminemia due to protein-calorie malnutrition   AKI (acute kidney injury)   Permanent atrial fibrillation   Alzheimer's dementia   Pressure ulcer  Failure to thrive Alzheimer's End-stage   Hypernatremia Elevated again yesterday, started on hypotonic fluids, resolved this morning.  Hypokalemia Resolved with supplementation   Chronic atrial fibrillation -Continue Eliquis, flecainide - Hold metoprolol    Disposition: Patient is medically stable, social worker been abnormal to find a safe disposition at this time, efforts to place the patient on ongoing  Alberteen Sam, MD [facility] [email or web address] 12/05/2022, 3:16 PM

## 2022-12-05 NOTE — Progress Notes (Addendum)
Palliative Medicine Inpatient Follow Up Note HPI: 82 yo F with advanced Alzheimer's HTN, Afib on Eliquis who presented with decreased responsiveness, found to have Na 167 and ESBL UTI.     Today's Discussion 12/05/2022  *Please note that this is a verbal dictation therefore any spelling or grammatical errors are due to the "Dragon Medical One" system interpretation.  Chart reviewed inclusive of vital signs, progress notes, laboratory results, and diagnostic images.   I spoke with patients spouse, Donna Simmons we reviewed patient's prolonged hospital admission.  We discussed Donna Simmons's clinical state in the setting of her advanced Alzheimer's type dementia.  I reviewed with him the progressive nature of dementia and how over time patient's no longer have the want need or desire to eat and drink.  We further reviewed patient's hypernatremia.  We discussed that this is something commonly seen when we are at the later phases of the disease and although we can correct it for short period of time with artificial hydration there is no good long-term solution.  I was open and honest sharing that patient will likely continue to cycle into and out of the inpatient setting for dehydration.  Created space and opportunity for patients spouse to explore thoughts feelings and fears regarding Donna Simmons's current medical situation. He shares that ideally he would take her home and care for her but he is limited as a former marine with his own musculoskeletal discomfort.   We reviewed that at this juncture in the setting of Alizey's disease processes I would strongly recommend consideration of hospice care. I described hospice as a service for patients who have a life expectancy of 6 months or less. The goal of hospice is the preservation of dignity and quality at the end phases of life. Under hospice care, the focus changes from curative to symptom relief. Donna Simmons shares that he understands this thought and will strongly  consider this in the future. He is open and agreeable to ongoing discussions during patients hospital stay as related to goals of care. He was thankful for the candid conversation(s).   Donna Simmons is interested in speaking to hospice for a better understanding in terms of what their care would look like. He is aware there would be a shift from curative therapies to symptom focused care.   Questions and concerns addressed/Palliative Support Provided.   Objective Assessment: Vital Signs Vitals:   12/05/22 0534 12/05/22 0844  BP: 115/78 105/64  Pulse:  88  Resp:  18  Temp: 98.7 F (37.1 C) 98.2 F (36.8 C)  SpO2:  98%    Intake/Output Summary (Last 24 hours) at 12/05/2022 1210 Last data filed at 12/05/2022 0404 Gross per 24 hour  Intake 1407.85 ml  Output 325 ml  Net 1082.85 ml   Last Weight  Most recent update: 12/05/2022  4:29 AM    Weight  40.8 kg (90 lb)            Gen:  Frail elderly F in NAD HEENT: Dry mucous membranes CV: Regular rate and rhythm  PULM: On RA, breathing is even and nonlabored ABD: soft/nontender EXT: No edema Neuro: Somnolent  SUMMARY OF RECOMMENDATIONS   DNAR/DNI  Open and honest conversation were held related to advanced AD and recurring hypernatremia  Discussed hospice as an option for care which patients spouse said he would strongly consider  Appreciate TOC setting up a meeting with Hospice and patients spouse for informational purposes  Ongoing PMT support   Time Spent: 65 Billing based on  MDM: High ______________________________________________________________________________________ Donna LulasMichelle Merric Simmons [place] Palliative Medicine Team Team Cell Phone: [phone] Please utilize secure chat with additional questions, if there is no response within 30 minutes please call the above phone number  Palliative Medicine Team providers are available by phone from 7am to 7pm daily and can be reached through the team cell phone.  Should this  patient require assistance outside of these hours, please call the patient's attending physician.

## 2022-12-06 DIAGNOSIS — Z515 Encounter for palliative care: Secondary | ICD-10-CM | POA: Diagnosis not present

## 2022-12-06 DIAGNOSIS — R627 Adult failure to thrive: Secondary | ICD-10-CM | POA: Diagnosis not present

## 2022-12-06 DIAGNOSIS — Z7189 Other specified counseling: Secondary | ICD-10-CM | POA: Diagnosis not present

## 2022-12-06 NOTE — Progress Notes (Signed)
Palliative Medicine Inpatient Follow Up Note HPI: 82 yo F with advanced Alzheimer's HTN, Afib on Eliquis who presented with decreased responsiveness, found to have Na 167 and ESBL UTI.     Today's Discussion 12/06/2022  *Please note that this is a verbal dictation therefore any spelling or grammatical errors are due to the "Dragon Medical One" system interpretation.  Chart reviewed inclusive of vital signs, progress notes, laboratory results, and diagnostic images.   I met at bedside this morning [facility]. She was awake and appeared to be grabbing at the air. Patients spouse, Santiago Bumpershurmond was in the recliner chair next to her.   Thurmond and I discussed Keisi's present Alzheimer's dementia and the progressive nature of the disease.  We reviewed that Thurmond had a sitdown conversation with his primary geriatrician, [facility] Alfredia [facility] back in October.  During that time there geriatrician was very open and honest sharing that this is a progressive disease and Suhana's time will be coming sooner than later.  While Marti Sleighhurman shares he understood that reality it is much harder when you are being faced with it.  Santiago Bumpershurmond shares that he is spent 50 years with [name] and they have both accomplished great things within that time.  Renesme achieved a physician assistant license and eventually went on to graduate with her doctoral degree.  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  She was involved in various community and out reach health initiatives.  Per Santiago Bumpershurmond she was very well-regarded.  Despite this he goes on to tell me that he and his wife had never planned for what their wishes would be in the future.  He expresses that this is where he is having the hardest time in terms of knowing what steps to take next.  I shared with Santiago Bumpershurmond that Alzheimer's dementia can be a very difficult disease to not only bear witness to but also to be the person  experiencing it.  I expressed that my goal is to alleviate any pain or suffering that [name] may be experiencing though unable to vocalize to us.  I shared that again the increasing sodium levels we are consistently seeing are not something which will be reversed long-term and I suspect this cycle will continue as a result of her lack of desire to eat and drink.  Thurmond understands this but continues to share he does not feel ready to stop the blood draws and the artificial hydration at this time.  I did share that in this case the hydration is acting as a form of life support and inhibiting Beanca's natural body processes.  I went on to elucidate the reality that we are preventing her from experiencing a natural and peaceful passing from this earth.  The differences between palliative care and hospice care were reviewed.  Santiago Bumpershurmond is still interested in meeting with a hospice liaison to understand the specific services that might be provided if he were to pursue that route.  During my time at bedside I was able to provide emotional support through therapeutic listening.  Education was provided on Alzheimer's dementia and the progressive irreversible nature of the disease.  Questions and concerns addressed.  Objective Assessment: Vital Signs Vitals:   12/06/22 0555 12/06/22 0633  BP: (!) 90/57 (!) 94/58  Pulse: 98 67  Resp: 18   Temp: 98.2 F (36.8 C)   SpO2: 98%     Intake/Output Summary (Last 24 hours) at 12/06/2022  7342 Last data filed at 12/06/2022 8768 Gross per 24 hour  Intake 237 ml  Output 500 ml  Net -263 ml    Last Weight  Most recent update: 12/06/2022  3:44 AM    Weight  40.4 kg (89 lb)            Gen:  Frail elderly F in NAD HEENT: Dry mucous membranes CV: Regular rate and rhythm  PULM: On RA, breathing is even and nonlabored ABD: soft/nontender EXT: No edema, moving arms, BLE extremities w. contractures Neuro: Awake, not verbal  SUMMARY OF RECOMMENDATIONS    DNAR/DNI  Open and honest conversation were held related to advanced AD and recurring hypernatremia  Discussed the differences between palliative support and hospice support --> patient's spouse openly vocalizes that he is struggling to determine the best course of action as he is spent the last 50 years with his wife  Appreciate TOC setting up a meeting with Hospice and patients spouse for informational purposes --> Per weekend support will depend on their availability  Ongoing PMT support   Time Spent: 72 Billing based on MDM: High ______________________________________________________________________________________ Lamarr Lulas [place] Palliative Medicine Team Team Cell Phone: [phone] Please utilize secure chat with additional questions, if there is no response within 30 minutes please call the above phone number  Palliative Medicine Team providers are available by phone from 7am to 7pm daily and can be reached through the team cell phone.  Should this patient require assistance outside of these hours, please call the patient's attending physician.

## 2022-12-06 NOTE — Progress Notes (Signed)
[facility] 2W08 [facility] Hospice hospital liaison note   Received request from TOC/PMT for family interest in [facility].    Chart under review and [facility] eligibility is Pending at this time. Visited at bedside and talked extensively with husband.   Unfortunately [facility] does not have a bed to offer today. TOC is aware hospital liaison will follow up tomorrow or sooner if room becomes available.    Please do not hesitate to call with any hospice related questions or concerns.    Thank you for the opportunity to participate in this patient's care.  Misty Green, RN, BSN Hospice Hospital Liaison  [phone] 

## 2022-12-06 NOTE — Plan of Care (Signed)

## 2022-12-06 NOTE — Progress Notes (Signed)
[facility] PROGRESS NOTE  Donna Simmons (DOB: 04-18-1941) OAC:[id number] PCP: Yolanda Manges, DO  Brief Narrative: 82 yo F with advanced Alzheimer's HTN, Afib on Eliquis who presented with decreased responsiveness, found to have Na 167 and ESBL UTI.    For further details, please see Dr. Raynelle Dick summary from 4/2     Subjective: No change in mentation, no fever, no reports of pain, no nursing concerns.    Objective: BP (!) 94/58 (BP Location: Right Arm)   Pulse 67   Temp 98.2 F (36.8 C) (Oral)   Resp 18   Ht 5\' 2"  (1.575 m)   Wt 40.4 kg   SpO2 98%   BMI 16.28 kg/m   Elderly female, lying in bed, contractures, opens eyes but makes no intelligible responses RRR, no murmurs, no peripheral edema Cachectic Respiratory effort seems weak, no rales or wheezes appreciated No grimace to palpation of the abdomen, no distention         Assessment & Plan: Principal Problem:   Failure to thrive in adult Active Problems:   GERD (gastroesophageal reflux disease)   Hypoalbuminemia due to protein-calorie malnutrition   AKI (acute kidney injury)   Permanent atrial fibrillation   Alzheimer's dementia   Pressure ulcer  Failure to thrive End-stage Alzheimer's disease -Consult hospice and palliative care  Hypernatremia Oral intake poor   Chronic atrial fibrillation -Continue Eliquis, flecainide - Hold metoprolol          Alberteen Sam, MD [facility] [email or web address] 12/06/2022, 11:57 AM

## 2022-12-07 DIAGNOSIS — Z7189 Other specified counseling: Secondary | ICD-10-CM | POA: Diagnosis not present

## 2022-12-07 DIAGNOSIS — R627 Adult failure to thrive: Secondary | ICD-10-CM | POA: Diagnosis not present

## 2022-12-07 DIAGNOSIS — Z515 Encounter for palliative care: Secondary | ICD-10-CM | POA: Diagnosis not present

## 2022-12-07 LAB — BASIC METABOLIC PANEL
Anion gap: 12 (ref 5–15)
BUN: 28 mg/dL — ABNORMAL HIGH (ref 8–23)
CO2: 24 mmol/L (ref 22–32)
Calcium: 8.7 mg/dL — ABNORMAL LOW (ref 8.9–10.3)
Chloride: 106 mmol/L (ref 98–111)
Creatinine, Ser: 0.53 mg/dL (ref 0.44–1.00)
GFR, Estimated: >60 mL/min (ref >60)
Glucose, Bld: 110 mg/dL — ABNORMAL HIGH (ref 70–99)
Potassium: 3.6 mmol/L (ref 3.5–5.1)
Sodium: 142 mmol/L (ref 135–145)

## 2022-12-07 NOTE — Progress Notes (Signed)
   Palliative Medicine Inpatient Follow Up Note HPI: 82 yo F with advanced Alzheimer's HTN, Afib on Eliquis who presented with decreased responsiveness, found to have Na 167 and ESBL UTI.     Today's Discussion 12/07/2022  *Please note that this is a verbal dictation therefore any spelling or grammatical errors are due to the "Dragon Medical One" system interpretation.  Chart reviewed inclusive of vital signs, progress notes, laboratory results, and diagnostic images.   I met at bedside this morning with Donna Dandy and her spouse, Donna Simmons.  Donna Simmons is more somnolent this morning as she had just been changed per her spouse.  Discussions held as they relate to patient's present clinical state.  Reviewed that Donna Simmons has end-stage dementia and her time on earth will be quite limited.  Patient's spouse is aware of this reality and has decided to pursue hospice at [facility] once a bed is available.  I did verify his understanding that when she goes there no additional blood draws, diagnostic tests, or interventions would be pursued other than those catered towards keeping her comfortable. Donna Simmons understands this.  We further discussed being that we know the goal is going to be comfort at [facility] if we could make that transition well Donna Simmons is hospitalized here. Donna Simmons would like to give that a little bit more thought though does share he will have a response to that question by the morning.   Questions and concerns addressed.  Palliative care support provided.  Objective Assessment: Vital Signs Vitals:   12/06/22 2057 12/07/22 0823  BP: (!) 157/142 (!) 160/72  Pulse: 91 100  Resp: 16 18  Temp: 98 F (36.7 C) 97.8 F (36.6 C)  SpO2: 99% 98%    Intake/Output Summary (Last 24 hours) at 12/07/2022 0856 Last data filed at 12/07/2022 0630 Gross per 24 hour  Intake --  Output 600 ml  Net -600 ml    Last Weight  Most recent update: 12/06/2022  3:44 AM    Weight  40.4 kg (89 lb)             Gen:  Frail elderly F in NAD HEENT: Dry mucous membranes CV: Regular rate and rhythm  PULM: On RA, breathing is even and nonlabored ABD: soft/nontender EXT: No edema, moving arms, BLE extremities w. contractures Neuro: Awake, not verbal  SUMMARY OF RECOMMENDATIONS   DNAR/DNI  Patient's spouse continues to consider whether or not to transition patient to full comfort care while inpatient  Plan for patient to transition to [facility] once approved and a bed is made available  Ongoing PMT support   Billing based on MDM: High ______________________________________________________________________________________ Lamarr Lulas [place] Palliative Medicine Team Team Cell Phone: [phone] Please utilize secure chat with additional questions, if there is no response within 30 minutes please call the above phone number  Palliative Medicine Team providers are available by phone from 7am to 7pm daily and can be reached through the team cell phone.  Should this patient require assistance outside of these hours, please call the patient's attending physician.

## 2022-12-07 NOTE — Progress Notes (Signed)
[facility] PROGRESS NOTE  Donna Simmons (DOB: April 03, 1941) JEH:[id number] PCP: Yolanda Manges, DO  Brief Narrative: 82 yo F with advanced Alzheimer's HTN, Afib on Eliquis who presented with decreased responsiveness, found to have Na 167 and ESBL UTI.    For further details, please see Dr. Raynelle Dick summary from 4/2     Subjective: No new nursing concerns, no complaints    Objective: BP 102/75 (BP Location: Left Arm)   Pulse 98   Temp 97.7 F (36.5 C)   Resp 16   Ht 5\' 2"  (1.575 m)   Wt 40.4 kg   SpO2 98%   BMI 16.28 kg/m   Elderly adult female, lying in bed RRR, no murmurs, no peripheral edema Respiratory effort weak, no rales or wheezes appreciated          Assessment & Plan: Principal Problem:   Failure to thrive in adult Active Problems:   GERD (gastroesophageal reflux disease)   Hypoalbuminemia due to protein-calorie malnutrition   AKI (acute kidney injury)   Permanent atrial fibrillation   Alzheimer's dementia   Pressure ulcer  Failure to thrive End-stage Alzheimer's disease -Consult hospice and palliative care  Hypernatremia Oral intake poor   Chronic atrial fibrillation - Continue Eliquis, flecainide          Alberteen Sam, MD [facility] [email or web address] 12/07/2022, 5:09 PM

## 2022-12-07 NOTE — Progress Notes (Addendum)
Per Palliative Medicine APP, pt's husband is agreeable to comfort measures and hospice home placement, prefers [facility]. Referral made to [facility] with [facility]. No bed availability over weekend and pt's approval for [facility] remains pending at this time. SW will f/u Monday.   Dellie Burns, MSW, LCSW [phone] (coverage)

## 2022-12-07 NOTE — Progress Notes (Signed)
[facility] 2W08 [facility]) Hospice hospital liaison note   Received request from TOC/PMT for family interest in [facility].    Chart under review and [facility] eligibility is Pending at this time. Visited at bedside and talked extensively with husband.   Unfortunately [facility] does not have a bed to offer today. TOC is aware hospital liaison will follow up tomorrow or sooner if room becomes available.    Please do not hesitate to call with any hospice related questions or concerns.    Thank you for the opportunity to participate in this patient's care.  Thea Gist, Charity fundraiser, [facility] Liaison  [phone]

## 2022-12-08 DIAGNOSIS — R627 Adult failure to thrive: Secondary | ICD-10-CM | POA: Diagnosis not present

## 2022-12-08 NOTE — TOC Progression Note (Signed)
Transition of Care [facility]) - Progression Note    Patient Details  Name: Donna Simmons MRN: [id number] Date of Birth: [birth year]  [facility] CM/SW Contact  Janae Bridgeman, RN Phone Number: 12/08/2022, 1:47 PM  Clinical Narrative:    CM called and spoke with Watt Climes Junious, CM with [facility] and the patient does not qualify for Inpatient Hospice placement at this time.  I called and spoke with Genia Harold, CM at [facility] and they do not have any LTC female beds at this time.     Barriers to Discharge: Financial Resources, Inadequate or no insurance, No SNF bed, SNF Authorization Denied, SNF Pending Medicaid RWB, Unsafe home situation, SNF Pending payor source - LOG, SNF Pending bed offer, Barriers Unresolved (comment)  Expected Discharge Plan and Services     Post Acute Care Choice: Nursing Home Living arrangements for the past 2 months[facility] hospital for [facility] days)                                       Social Determinants of Health (SDOH) Interventions SDOH Screenings   Food Insecurity: Patient Declined (10/19/2022)  Alcohol Screen: Low Risk  (04/12/2019)  Depression (PHQ2-9): Low Risk  (11/05/2020)  Tobacco Use: Low Risk  (11/22/2022)    Readmission Risk Interventions     No data to display

## 2022-12-08 NOTE — Progress Notes (Signed)
Civil engineer, contracting [facility]) Hospital Liaison Note  At this time, patient does not meet criteria for [facility] transfer due to continued lack of symptom management needs. [facility] is available to provide services in the home or LTC if family choose to proceed with TLC transfer.   [facility] to follow peripherally but are available to reassess for IPU transfer if needs arise.    Please do not hesitate to call with any hospice related questions.    Thank you for the opportunity to participate in this patient's care.  Eugenie Birks, MSW [facility] Liaison  [phone]

## 2022-12-08 NOTE — Progress Notes (Signed)
                                                     Palliative Care Progress Note, Assessment & Plan   Patient Name: Donna Simmons       Date: 12/08/2022 DOB: 10/23/1940  Age: 82 y.o. MRN#: [id number] Attending Physician: Alberteen Sam, * Primary Care Physician: Yolanda Manges, DO Admit Date: 08/25/2022  Subjective: ***  HPI: ***  Summary of counseling/coordination of care: After reviewing the patient's chart and assessing the patient at bedside, ***  Therapeutic silence and active listening provided for *** to share *** thoughts and emotions regarding current medical situation.  Emotional support provided.  Physical Exam          Total Time *** minutes   Donna Simmons L. Manon Hilding, FNP-BC Palliative Medicine Team Team Phone # [phone]

## 2022-12-08 NOTE — Progress Notes (Signed)
[facility] PROGRESS NOTE  Donna Simmons (DOB: 21-Nov-1940) MVE:[id number] PCP: Yolanda Manges, DO  Brief Narrative: 82 yo F with advanced Alzheimer's HTN, Afib on Eliquis who presented with decreased responsiveness, found to have Na 167 and ESBL UTI.    For further details, please see Dr. Raynelle Dick summary from 4/2     Subjective: No new nursing concerns, no complaints    Objective: BP 114/75 (BP Location: Right Arm)   Pulse 97   Temp 97.9 F (36.6 C) (Oral)   Resp 18   Ht 5\' 2"  (1.575 m)   Wt 40.4 kg   SpO2 97%   BMI 16.28 kg/m   Elderly adult female, lying in bed RRR, no murmurs, no peripheral edema Respiratory effort weak, no rales or wheezes appreciated          Assessment & Plan: Principal Problem:   Failure to thrive in adult Active Problems:   GERD (gastroesophageal reflux disease)   Hypoalbuminemia due to protein-calorie malnutrition   AKI (acute kidney injury)   Permanent atrial fibrillation   Alzheimer's dementia   Pressure ulcer  Failure to thrive End-stage Alzheimer's disease -Consult hospice and palliative care  Hypernatremia Oral intake poor   Chronic atrial fibrillation - Continue Eliquis, flecainide          Alberteen Sam, MD [facility] [email or web address] 12/08/2022, 6:08 PM

## 2022-12-08 NOTE — TOC Progression Note (Signed)
Transition of Care [facility]) - Progression Note    Patient Details  Name: Donna Simmons MRN: [id number] Date of Birth: [birth year]  Transition of Care [facility]) CM/SW Contact  Janae Bridgeman, RN Phone Number: 12/08/2022, 11:32 AM  Clinical Narrative:    CM called and sent a message to Junious, CM with [facility] and the patient's clinicals are currently under review by Medical director with [facility] place at this time.  Dr. Maryfrances Bunnell, Attending MD was updated.     Barriers to Discharge: Financial Resources, Inadequate or no insurance, No SNF bed, SNF Authorization Denied, SNF Pending Medicaid RWB, Unsafe home situation, SNF Pending payor source - LOG, SNF Pending bed offer, Barriers Unresolved (comment)  Expected Discharge Plan and Services     Post Acute Care Choice: Nursing Home Living arrangements for the past 2 months: Skilled Nursing Facility (SNF then hospital for 88 days)                                       Social Determinants of Health (SDOH) Interventions SDOH Screenings   Food Insecurity: Patient Declined (10/19/2022)  Alcohol Screen: Low Risk  (04/12/2019)  Depression (PHQ2-9): Low Risk  (11/05/2020)  Tobacco Use: Low Risk  (11/22/2022)    Readmission Risk Interventions     No data to display

## 2022-12-09 DIAGNOSIS — E876 Hypokalemia: Secondary | ICD-10-CM | POA: Diagnosis not present

## 2022-12-09 DIAGNOSIS — R627 Adult failure to thrive: Secondary | ICD-10-CM | POA: Diagnosis not present

## 2022-12-09 DIAGNOSIS — N179 Acute kidney failure, unspecified: Secondary | ICD-10-CM | POA: Diagnosis not present

## 2022-12-09 DIAGNOSIS — R7309 Other abnormal glucose: Secondary | ICD-10-CM | POA: Diagnosis not present

## 2022-12-09 NOTE — Progress Notes (Signed)
[facility] PROGRESS NOTE  Donna Simmons (DOB: 1941/07/27) [id number] PCP: Yolanda Manges, DO  Brief Narrative: 82 yo F with advanced Alzheimer's HTN, Afib on Eliquis who presented with decreased responsiveness, found to have Na 167 and ESBL UTI.    For further details, please see Dr. Raynelle Dick summary from 4/2     Subjective: No new nursing concerns.  Nonverbal    Objective: BP 111/70 (BP Location: Right Arm)   Pulse 93   Temp 98.3 F (36.8 C)   Resp 18   Ht 5\' 2"  (1.575 m)   Wt 40.4 kg   SpO2 98%   BMI 16.28 kg/m   Elderly female, nonverbal, contractured, lying in bed          Assessment & Plan: Principal Problem:   Failure to thrive in adult Active Problems:   GERD (gastroesophageal reflux disease)   Hypoalbuminemia due to protein-calorie malnutrition   AKI (acute kidney injury)   Permanent atrial fibrillation   Alzheimer's dementia   Pressure ulcer  Failure to thrive End-stage Alzheimer's disease -Consult hospice and palliative care  Hypernatremia Oral intake poor   Chronic atrial fibrillation - Continue Eliquis, flecainide          Alberteen Sam, MD [facility] [email or web address] 12/09/2022, 6:19 PM

## 2022-12-09 NOTE — Plan of Care (Signed)
  Problem: Nutrition: Goal: Adequate nutrition will be maintained Outcome: Not Progressing   Problem: Elimination: Goal: Will not experience complications related to bowel motility Outcome: Not Progressing   Problem: Skin Integrity: Goal: Risk for impaired skin integrity will decrease Outcome: Not Progressing   

## 2022-12-09 NOTE — TOC Progression Note (Signed)
Transition of Care [facility]) - Progression Note    Patient Details  Name: ORANGIE FOLMSBEE MRN: [id number] Date of Birth: 02-11-1941  Transition of Care [facility]) CM/SW Contact  Baldemar Lenis, [place] Phone Number: 12/09/2022, 10:11 AM  Clinical Narrative:   CSW attempting to locate long term care bed for patient. CSW contacted [facility], they do not have a female LTC bed. CSW contacted [facility] and [facility], they do have long term care availability. CSW sent referral, awaiting response back.      Barriers to Discharge: Financial Resources, Inadequate or no insurance, No SNF bed, SNF Authorization Denied, SNF Pending Medicaid RWB, Unsafe home situation, SNF Pending payor source - LOG, SNF Pending bed offer, Barriers Unresolved (comment)  Expected Discharge Plan and Services     Post Acute Care Choice: Nursing Home Living arrangements for the past 2 months: Skilled Nursing Facility (SNF then hospital for 88 days)                                       Social Determinants of Health (SDOH) Interventions SDOH Screenings   Food Insecurity: Patient Declined (10/19/2022)  Alcohol Screen: Low Risk  (04/12/2019)  Depression (PHQ2-9): Low Risk  (11/05/2020)  Tobacco Use: Low Risk  (11/22/2022)    Readmission Risk Interventions     No data to display

## 2022-12-09 NOTE — Progress Notes (Signed)
                                                     Palliative Care Progress Note, Assessment & Plan   Patient Name: Donna Simmons       Date: 12/09/2022 DOB: Aug 14, 1941  Age: 82 y.o. MRN#: [id number] Attending Physician: Alberteen Sam, * Primary Care Physician: Yolanda Manges, DO Admit Date: 08/25/2022  Subjective: Patient is lying in bed in no apparent distress.  She is easily arousable, acknowledges my presence, and quickly closes her eyes to return back to sleep. She does not make verbalizations for me and is unable to participate in discussions independently. No family/friends present at bedside during my encounter.   HPI: 82 yo F with advanced Alzheimer's HTN, Afib on Eliquis who presented with decreased responsiveness, found to have Na 167 and ESBL UTI.   Summary of counseling/coordination of care: After reviewing the patient's chart and assessing the patient at bedside, I again attempted to speak with patient's husband over the phone.  No answer.  HIPAA compliant voicemail left. PMT contact information given.   I counseled with attending Dr. Maryfrances Bunnell in regards to plan of care and disposition. Patient is not eligible for inpatient hospice placement with [facility], but other hospice organizations with inpatient units discussed. TOC to investigate.   DNR remains.   PMT will continue to follow and support patient/family throughout her hospitalization  Physical Exam Vitals reviewed.  Constitutional:      General: She is not in acute distress.    Appearance: She is not ill-appearing.  HENT:     Head: Normocephalic.     Mouth/Throat:     Mouth: Mucous membranes are moist.  Cardiovascular:     Rate and Rhythm: Normal rate.     Pulses: Normal pulses.  Pulmonary:     Effort: Pulmonary effort is normal.  Abdominal:     Palpations:  Abdomen is soft.  Musculoskeletal:     Comments: Generalized weakness  Skin:    General: Skin is warm and dry.             Total Time 25 minutes   Octa Uplinger L. Manon Hilding, FNP-BC Palliative Medicine Team Team Phone # [phone]

## 2022-12-09 NOTE — TOC Progression Note (Addendum)
Transition of Care [facility]) - Progression Note    Patient Details  Name: ANAELLE VATER MRN: [id number] Date of Birth: December 30, 1940  Transition of Care [facility]) CM/SW Contact  Carley Hammed, [place] Phone Number: 12/09/2022, 11:49 AM  Clinical Narrative:     CSW noting pt was reviewed and determined to not be eligible for GIP Hospice placement at this time. Per team, pt's spouse may be more agreeable to private paying for LTC at this time. CSW messaged [facility] who have LTC private pay beds and will review, TOC will continue to follow.    [facility] still reviewing, $304 ad day, 30 days up front $9120. Barriers to Discharge: Financial Resources, Inadequate or no insurance, No SNF bed, SNF Authorization Denied, SNF Pending Medicaid RWB, Unsafe home situation, SNF Pending payor source - LOG, SNF Pending bed offer, Barriers Unresolved (comment)  Expected Discharge Plan and Services     Post Acute Care Choice: Nursing Home Living arrangements for the past 2 months: Skilled Nursing Facility (SNF then hospital for 88 days)                                       Social Determinants of Health (SDOH) Interventions SDOH Screenings   Food Insecurity: Patient Declined (10/19/2022)  Alcohol Screen: Low Risk  (04/12/2019)  Depression (PHQ2-9): Low Risk  (11/05/2020)  Tobacco Use: Low Risk  (11/22/2022)    Readmission Risk Interventions     No data to display

## 2022-12-10 DIAGNOSIS — E876 Hypokalemia: Secondary | ICD-10-CM | POA: Diagnosis not present

## 2022-12-10 DIAGNOSIS — E870 Hyperosmolality and hypernatremia: Secondary | ICD-10-CM | POA: Diagnosis not present

## 2022-12-10 DIAGNOSIS — N179 Acute kidney failure, unspecified: Secondary | ICD-10-CM | POA: Diagnosis not present

## 2022-12-10 DIAGNOSIS — R627 Adult failure to thrive: Secondary | ICD-10-CM | POA: Diagnosis not present

## 2022-12-10 NOTE — TOC Progression Note (Addendum)
Transition of Care [facility]) - Progression Note    Patient Details  Name: Donna Simmons MRN: [id number] Date of Birth: 16-Jul-1941  Transition of Care [facility]) CM/SW Contact  Janae Bridgeman, RN Phone Number: 12/10/2022, 11:08 AM  Clinical Narrative:    CM called and left a voicemail message with [facility] to follow up regarding needed LTC bed at the facility.  Patient was declined for [facility] facility.  Patient is currently under comfort care orders and is medically stable to discharge to LTC facility with private pay by the patient's husband.  12/10/2022 1134 - I met with the patient's husband and updated him that patient did not meet admission criteria for Inpatient Hospice placement at [facility].  I explained to the patient's husband that patient needs LTC placement with [facility] to follow the patient in the accepting facility.  Patient is aware that he will need to pay out of pocket for placement.  I discussed with the patient's husband that [facility] has not return the follow up phone call and that I would explore other facilities for LTC bed availability.  I offered to the patient's husband that [facility] may likely have availability.  I called Debbie at [facility] and she is reviewing clinicals at this time.  Current MAR was sent to Debbie at [facility] LTC to review.  CM will continue to follow the patient for LTC placement needs.  12/10/22 1454 - I spoke with Whitney Post, CM with [facility] and the facility has offered a semi-private LTC bed to the patient.  I met with the patient's husband at the bedside and provided him with my phone to speak with [facility] directly regarding bed offer and upfront cost for admission.  He state that he will call back and speak with the facility tonight I will follow up with him in the am.  I explained to the other option if he declines the bed offer is to have the patient return to home with him at the home and have  [facility] provide home hospice support and he would need to hire private duty assistance at the home.  The patient's husband is aware of both options and states that LTC admission is likely plan.  [facility] LTC facility is reviewing the patient's clinicals as well but husband states that this distance would not be optimal for transportation for him.  I will follow up in the am with the patient's husband regarding his decision to accept the LTC bed offer.  LCSW is aware and will follow as needed for communication with the patient's husband and safe disposition plans.     Barriers to Discharge: Financial Resources, Inadequate or no insurance, No SNF bed, SNF Authorization Denied, SNF Pending Medicaid RWB, Unsafe home situation, SNF Pending payor source - LOG, SNF Pending bed offer, Barriers Unresolved (comment)  Expected Discharge Plan and Services     Post Acute Care Choice: Nursing Home Living arrangements for the past 2 months: Skilled Nursing Facility (SNF then hospital for 88 days)                                       Social Determinants of Health (SDOH) Interventions SDOH Screenings   Food Insecurity: Patient Declined (10/19/2022)  Alcohol Screen: Low Risk  (04/12/2019)  Depression (PHQ2-9): Low Risk  (11/05/2020)  Tobacco Use: Low Risk  (11/22/2022)  Readmission Risk Interventions     No data to display

## 2022-12-10 NOTE — Progress Notes (Signed)
CSW contacted [facility] and [facility] requesting review/update on referral. Awaiting responses.

## 2022-12-10 NOTE — Consult Note (Signed)
WOC Nurse wound follow up Wound type: Pressure  Measurement:  Sacrum Stage 4 Pressure Injury 4 cms x 2.5 cms x 0.5 cms with 1.5 cm undermining from 6 o'clock to 12 o'clock, 80% pink and moist, 20% dark devitalized tissue, epibole of wound edges noted  R trochanter former Unstageable Pressure Injury now presenting as Stage 3 Pressure Injury 2 cms x 1.5 cms x 1.8 cms 100% pink and moist  R medial foot 1 cm x 1 cm x 0.1 cm 100% pink and moist  L lateral foot Stage 3 Pressure Injury 1 cm x 1 cm x 0.1 cm 100% pink and moist  Drainage (amount, consistency, odor) minimal serosanguinous to all wounds  Periwound: scarring and maceration noted to sacral wound, all others intact  Dressing procedure/placement/frequency:  Sacral and R trochanter moist to dry dressing changes twice daily:  Using Q tip applicator fill wound with moist to dry saline gauze twice daily, top with dry gauze and cover with Silicone foam.  Change foam dressing q3 days and prn soiling.   All wounds to feet apply silicone foam dressings, change q3 days and prn soiling.  Feet should remain in Prevalon boots for offloading of pressure points.   Patient remains on low air loss mattress for pressure redistribution and moisture management.    WOC nursing team will continue to follow this patient every 7 to 10 days for wound assessment.   Thank you,   Priscella Mann MSN, RN-BC, [employer] [phone]

## 2022-12-10 NOTE — Plan of Care (Signed)
  Problem: Safety: Goal: Ability to remain free from injury will improve Outcome: Not Progressing   Problem: Skin Integrity: Goal: Risk for impaired skin integrity will decrease Outcome: Not Progressing   

## 2022-12-10 NOTE — Progress Notes (Addendum)
[facility]                                                                              Donna Simmons, is a 82 y.o. female, DOB - 03-Feb-1941, IRC:[id number] Admit date - 08/25/2022    Outpatient Primary MD for the patient is Yolanda Manges, DO  LOS - 106  days  Chief Complaint  Patient presents with   Failure To Thrive       Brief summary   82 yo h/o Alzheimer's dementia, hypertension, hyperlipidemia, and permanent A-fib on Eliquis admitted on 08/25/22 from SNF with failure to thrive, hypernatremia with a sodium of 167, poor oral intake.  Workup revealed UTI ESBL.  Patient treated with IV Ertapenem.  Currently awaiting long-term placement.  She could not return to SNF because of unpaid bills, family is unable to take care of her at home.  She is unable to qualify for Medicaid currently for financial reasons.   Pt currently with wounds and poor oral intake. Usually non verbal, non mobile. LOG was not accepted and can now not be offered due to Medicaid declining. CSW to consult DTP team to review for further assistance with disposition.    Assessment & Plan     Failure to thrive End-stage Alzheimer's disease -Palliative care following, last seen on 4/7, had recommended patient's husband to consider comfort care.    Hypernatremia Oral intake poor, encourage p.o. diet - Na stable   Chronic atrial fibrillation - HR controlled, continue flecainide -Continue eliquis   ESBL UTI -Completed course of ertapenem  Acute kidney injury -Resolved, creatinine 1.19 on admission  Chronic iron deficiency anemia -H&H stable  Pressure injury documentation Sacrum stage IV Right hip, unstageable Left labia stage II -Wound care per nursing   Severe 18-calorie malnutrition Signs/Symptoms: severe muscle depletion, severe fat depletion Interventions: Ensure Enlive (each supplement provides 350kcal and 20 grams of protein), MVI Estimated body mass index is 16.28 kg/m  as calculated from the following:   Height as of this encounter: 5\' 2"  (1.575 m).   Weight as of this encounter: 40.4 kg.  Code Status: DNR DVT Prophylaxis:  apixaban (ELIQUIS) tablet 2.5 mg Start: 12/03/22 2200 SCDs Start: 08/25/22 0659 apixaban (ELIQUIS) tablet 2.5 mg   Level of Care: Level of care: Med-Surg Family Communication: Updated patient's husband at the bedside Disposition Plan:      Remains inpatient appropriate: Awaiting disposition  Antimicrobials: Completed course of ertapenem  Medications  apixaban  2.5 mg Oral BID   Chlorhexidine Gluconate Cloth  6 each Topical Daily   feeding supplement  237 mL Oral TID BM   ferrous sulfate  325 mg Oral QODAY   flecainide  50 mg Oral BID   leptospermum manuka honey  1 Application Topical Daily   multivitamin with minerals  1 tablet Oral Daily   pantoprazole  40 mg Oral Daily      Subjective:   Donna Simmons was seen and examined today.  No acute complaints, husband at the bedside.  No issues overnight.  Objective:   Vitals:   12/09/22 0800 12/09/22 2006 12/10/22 0432 12/10/22 0805  BP: 111/70 105/66  107/70 (!) 136/99  Pulse: 93 93 100 97  Resp: 18 18 17 18   Temp: 98.3 F (36.8 C) 97.8 F (36.6 C) 98.3 F (36.8 C) (!) 97.3 F (36.3 C)  TempSrc:   Oral   SpO2: 98% 98% 100% 99%  Weight:      Height:        Intake/Output Summary (Last 24 hours) at 12/10/2022 1009 Last data filed at 12/10/2022 0439 Gross per 24 hour  Intake 110 ml  Output 625 ml  Net -515 ml     Wt Readings from Last 3 Encounters:  12/09/22 40.4 kg  07/03/22 50.2 kg  04/24/22 54.4 kg     Exam General: Much more alert and awake today,, appears comfortable, smiling, husband at the bedside Cardiovascular: S1 S2 auscultated,  RRR Respiratory: CTA B Gastrointestinal: Soft, NT, ND, NBS Ext: no pedal edema bilaterally Neuro: moving all 4 extremities spontaneously Psych: alert and awake, has dementia    Data Reviewed:  I have personally  reviewed following labs    CBC Lab Results  Component Value Date   WBC 7.1 12/01/2022   RBC 4.37 12/01/2022   HGB 9.7 (L) 12/01/2022   HCT 32.6 (L) 12/01/2022   MCV 74.6 (L) 12/01/2022   MCH 22.2 (L) 12/01/2022   PLT 287 12/01/2022   MCHC 29.8 (L) 12/01/2022   RDW 21.3 (H) 12/01/2022   LYMPHSABS 1.2 12/01/2022   MONOABS 0.4 12/01/2022   EOSABS 0.1 12/01/2022   BASOSABS 0.0 12/01/2022     Last metabolic panel Lab Results  Component Value Date   NA 142 12/07/2022   K 3.6 12/07/2022   CL 106 12/07/2022   CO2 24 12/07/2022   BUN 28 (H) 12/07/2022   CREATININE 0.53 12/07/2022   GLUCOSE 110 (H) 12/07/2022   GFRNONAA >60 12/07/2022   GFRAA 62 04/04/2020   CALCIUM 8.7 (L) 12/07/2022   PHOS 2.6 11/16/2022   PROT 6.7 11/27/2022   ALBUMIN 2.5 (L) 11/27/2022   LABGLOB 2.6 04/04/2020   AGRATIO 1.7 04/04/2020   BILITOT 0.8 11/27/2022   ALKPHOS 56 11/27/2022   AST 16 11/27/2022   ALT 16 11/27/2022   ANIONGAP 12 12/07/2022    CBG (last 3)  No results for input(s): "GLUCAP" in the last 72 hours.    Coagulation Profile: No results for input(s): "INR", "PROTIME" in the last 168 hours.   Radiology Studies: I have personally reviewed the imaging studies  No results found.     Thad Ranger M.D. [facility] 12/10/2022, 10:09 AM  Available via Epic secure chat 7am-7pm After 7 pm, please refer to night coverage provider listed on amion.

## 2022-12-11 DIAGNOSIS — R627 Adult failure to thrive: Secondary | ICD-10-CM | POA: Diagnosis not present

## 2022-12-11 DIAGNOSIS — N179 Acute kidney failure, unspecified: Secondary | ICD-10-CM | POA: Diagnosis not present

## 2022-12-11 DIAGNOSIS — E870 Hyperosmolality and hypernatremia: Secondary | ICD-10-CM | POA: Diagnosis not present

## 2022-12-11 DIAGNOSIS — E876 Hypokalemia: Secondary | ICD-10-CM | POA: Diagnosis not present

## 2022-12-11 MED ORDER — FERROUS SULFATE 325 (65 FE) MG PO TABS: 325.0000 mg | ORAL_TABLET | ORAL | 3 refills | Status: AC

## 2022-12-11 MED ORDER — ADULT MULTIVITAMIN W/MINERALS CH: 1.0000 | ORAL_TABLET | Freq: Every day | ORAL | Status: AC

## 2022-12-11 MED ORDER — APIXABAN 2.5 MG PO TABS: 5.0000 mg | ORAL_TABLET | Freq: Two times a day (BID) | ORAL | Status: DC

## 2022-12-11 NOTE — Progress Notes (Signed)
[facility]                                                                              Donna Simmons, is a 82 y.o. female, DOB - Aug 23, 1941, FRT:[id number] Admit date - 08/25/2022    Outpatient Primary MD for the patient is Yolanda Manges, DO  LOS - 107  days  Chief Complaint  Patient presents with   Failure To Thrive       Brief summary   82 yo h/o Alzheimer's dementia, hypertension, hyperlipidemia, and permanent A-fib on Eliquis admitted on 08/25/22 from SNF with failure to thrive, hypernatremia with a sodium of 167, poor oral intake.  Workup revealed UTI ESBL.  Patient treated with IV Ertapenem.  Currently awaiting long-term placement.  She could not return to SNF because of unpaid bills, family is unable to take care of her at home.  She is unable to qualify for Medicaid currently for financial reasons.   Pt currently with wounds and poor oral intake. Usually non verbal, non mobile. LOG was not accepted and can now not be offered due to Medicaid declining. CSW to consult DTP team to review for further assistance with disposition.    Assessment & Plan     Failure to thrive End-stage Alzheimer's disease -Palliative care following, last seen on 4/7, had recommended patient's husband to consider comfort care.  -No acute issues, husband at the bedside.   Hypernatremia Oral intake poor, encourage p.o. diet - Na stable   Chronic atrial fibrillation - HR controlled, continue flecainide -Continue eliquis   ESBL UTI -Completed course of ertapenem  Acute kidney injury -Resolved, creatinine 1.19 on admission  Chronic iron deficiency anemia -H&H stable  Pressure injury documentation Sacrum stage IV Right hip, unstageable Left labia stage II -Wound care per nursing   Severe 18-calorie malnutrition Signs/Symptoms: severe muscle depletion, severe fat depletion Interventions: Ensure Enlive (each supplement provides 350kcal and 20 grams of protein),  MVI Estimated body mass index is 16.28 kg/m as calculated from the following:   Height as of this encounter: 5\' 2"  (1.575 m).   Weight as of this encounter: 40.4 kg.  Code Status: DNR DVT Prophylaxis:  apixaban (ELIQUIS) tablet 2.5 mg Start: 12/03/22 2200 SCDs Start: 08/25/22 0659 apixaban (ELIQUIS) tablet 2.5 mg   Level of Care: Level of care: Med-Surg Family Communication: Updated patient's husband at the bedside Disposition Plan:      Remains inpatient appropriate:  Patient is accepted to [facility], facility awaiting upfront payment. Pt's husband is making arrangement to have the money transferred to the facility.  Will DC to SNF today if all arrangements are completed today vs in am.     Antimicrobials: Completed course of ertapenem  Medications  apixaban  2.5 mg Oral BID   Chlorhexidine Gluconate Cloth  6 each Topical Daily   feeding supplement  237 mL Oral TID BM   ferrous sulfate  325 mg Oral QODAY   flecainide  50 mg Oral BID   multivitamin with minerals  1 tablet Oral Daily   pantoprazole  40 mg Oral Daily      Subjective:  Donna Simmons was seen and examined today.  No acute issues but did not sleep well last night.  Nonverbal, husband at the bedside.    Objective:   Vitals:   12/10/22 1537 12/10/22 2201 12/11/22 0426 12/11/22 0759  BP: 114/78 119/84 93/77 116/72  Pulse: 97 (!) 105 81 99  Resp: 18 19  18   Temp: (!) 97.3 F (36.3 C) (!) 97.2 F (36.2 C) 98.3 F (36.8 C) 98.9 F (37.2 C)  TempSrc:  Oral Oral   SpO2: 96% 97% (!) 82% 98%  Weight:      Height:        Intake/Output Summary (Last 24 hours) at 12/11/2022 1131 Last data filed at 12/11/2022 0456 Gross per 24 hour  Intake --  Output 555 ml  Net -555 ml     Wt Readings from Last 3 Encounters:  12/09/22 40.4 kg  07/03/22 50.2 kg  04/24/22 54.4 kg   Physical Exam General: Somewhat sleepy, comfortable, husband at the bedside Cardiovascular: S1 S2 clear, RRR.  Respiratory: CTAB,  no wheezing anteriorly Gastrointestinal: Soft, nontender, nondistended, NBS Ext: no pedal edema bilaterally Neuro: moving all 4 extremities Psych: dementia   Data Reviewed:  I have personally reviewed following labs    CBC Lab Results  Component Value Date   WBC 7.1 12/01/2022   RBC 4.37 12/01/2022   HGB 9.7 (L) 12/01/2022   HCT 32.6 (L) 12/01/2022   MCV 74.6 (L) 12/01/2022   MCH 22.2 (L) 12/01/2022   PLT 287 12/01/2022   MCHC 29.8 (L) 12/01/2022   RDW 21.3 (H) 12/01/2022   LYMPHSABS 1.2 12/01/2022   MONOABS 0.4 12/01/2022   EOSABS 0.1 12/01/2022   BASOSABS 0.0 12/01/2022     Last metabolic panel Lab Results  Component Value Date   NA 142 12/07/2022   K 3.6 12/07/2022   CL 106 12/07/2022   CO2 24 12/07/2022   BUN 28 (H) 12/07/2022   CREATININE 0.53 12/07/2022   GLUCOSE 110 (H) 12/07/2022   GFRNONAA >60 12/07/2022   GFRAA 62 04/04/2020   CALCIUM 8.7 (L) 12/07/2022   PHOS 2.6 11/16/2022   PROT 6.7 11/27/2022   ALBUMIN 2.5 (L) 11/27/2022   LABGLOB 2.6 04/04/2020   AGRATIO 1.7 04/04/2020   BILITOT 0.8 11/27/2022   ALKPHOS 56 11/27/2022   AST 16 11/27/2022   ALT 16 11/27/2022   ANIONGAP 12 12/07/2022    CBG (last 3)  No results for input(s): "GLUCAP" in the last 72 hours.    Coagulation Profile: No results for input(s): "INR", "PROTIME" in the last 168 hours.   Radiology Studies: I have personally reviewed the imaging studies  No results found.     Thad Ranger M.D. [facility] 12/11/2022, 11:31 AM  Available via Epic secure chat 7am-7pm After 7 pm, please refer to night coverage provider listed on amion.

## 2022-12-11 NOTE — TOC Progression Note (Addendum)
Transition of Care [facility]) - Progression Note    Patient Details  Name: Donna Simmons MRN: [id number] Date of Birth: April 27, 1941  Transition of Care [facility]) CM/SW Contact  Janae Bridgeman, RN Phone Number: 12/11/2022, 8:59 AM  Clinical Narrative:    CM met with the patient's husband at the bedside and the husband accepted the bed offer to [facility].  I called Logan, CM at [facility] admission and she and the husband are making arrangements to have the money transferred to the facility - likely today but still pending transfer at this time.  Attending MD was updated.  [facility] will accept the patient for admission once the funds are transferred only.  12/11/22 2876 - CM spoke with Charlton Amor, CM with [facility] and they will reach out to the husband today to discuss that they will follow the patient for services at the nursing home facility.  12/11/22 1556 - I met with the patient at the bedside and the patient's husband states that he is having funds transferred to the [employer] but that the facility would not receive payment for her admission until Monday.  Logan, CM at [facility] is aware as well and patient will likely discharge to [facility] on Monday.  Attending MD notified and aware.     Barriers to Discharge: Financial Resources, Inadequate or no insurance, No SNF bed, SNF Authorization Denied, SNF Pending Medicaid RWB, Unsafe home situation, SNF Pending payor source - LOG, SNF Pending bed offer, Barriers Unresolved (comment)  Expected Discharge Plan and Services     Post Acute Care Choice: Nursing Home Living arrangements for the past 2 months: Skilled Nursing Facility (SNF then hospital for 88 days)                                       Social Determinants of Health (SDOH) Interventions SDOH Screenings   Food Insecurity: Patient Declined (10/19/2022)  Alcohol Screen: Low Risk  (04/12/2019)  Depression (PHQ2-9): Low Risk   (11/05/2020)  Tobacco Use: Low Risk  (11/22/2022)    Readmission Risk Interventions     No data to display

## 2022-12-11 NOTE — Progress Notes (Signed)
[facility] 2W08 - [facility]) Hospital Liaison Note:  Referral received from [facility] for hospice services upon discharge.   Met with patient spouse to explain hospice philosophy, ACC services and offer support. Family verbalized understanding of information given. Per discussion, Plan is to discharge to LTC facility via ambulance once medically stable and financial arrangements can be made. Possible discharge to [facility] today or tomorrow.   Patient's spouse Santiago Bumpers) contact information is correct in the chart. ACC information and contact numbers were given to [facility] and encouraged to call for support as needed.   Currently, patient has no DME needs per spouse.    Please send completed DNR with patient upon discharge.   Please arrange for any comfort scripts that may be needed for symptom management so there is no lapse in patient comfort prior to hospice services beginning.  Thank you for the opportunity to participate in this patient's care,   Roda Shutters, RN [facility] Liaison (in [place]) [phone]

## 2022-12-12 DIAGNOSIS — Z515 Encounter for palliative care: Secondary | ICD-10-CM | POA: Diagnosis not present

## 2022-12-12 DIAGNOSIS — E870 Hyperosmolality and hypernatremia: Secondary | ICD-10-CM | POA: Diagnosis not present

## 2022-12-12 DIAGNOSIS — Z66 Do not resuscitate: Secondary | ICD-10-CM

## 2022-12-12 DIAGNOSIS — R627 Adult failure to thrive: Secondary | ICD-10-CM | POA: Diagnosis not present

## 2022-12-12 DIAGNOSIS — G309 Alzheimer's disease, unspecified: Secondary | ICD-10-CM | POA: Diagnosis not present

## 2022-12-12 LAB — CBC
HCT: 34.4 % — ABNORMAL LOW (ref 36.0–46.0)
Hemoglobin: 10.5 g/dL — ABNORMAL LOW (ref 12.0–15.0)
MCH: 22.2 pg — ABNORMAL LOW (ref 26.0–34.0)
MCHC: 30.5 g/dL (ref 30.0–36.0)
MCV: 72.7 fL — ABNORMAL LOW (ref 80.0–100.0)
Platelets: 473 10*3/uL — ABNORMAL HIGH (ref 150–400)
RBC: 4.73 MIL/uL (ref 3.87–5.11)
RDW: 20.9 % — ABNORMAL HIGH (ref 11.5–15.5)
WBC: 9 10*3/uL (ref 4.0–10.5)
nRBC: 0 % (ref 0.0–0.2)

## 2022-12-12 LAB — BASIC METABOLIC PANEL
Anion gap: 10 (ref 5–15)
BUN: 33 mg/dL — ABNORMAL HIGH (ref 8–23)
CO2: 25 mmol/L (ref 22–32)
Calcium: 8.8 mg/dL — ABNORMAL LOW (ref 8.9–10.3)
Chloride: 117 mmol/L — ABNORMAL HIGH (ref 98–111)
Creatinine, Ser: 0.59 mg/dL (ref 0.44–1.00)
GFR, Estimated: >60 mL/min (ref >60)
Glucose, Bld: 102 mg/dL — ABNORMAL HIGH (ref 70–99)
Potassium: 3.7 mmol/L (ref 3.5–5.1)
Sodium: 152 mmol/L — ABNORMAL HIGH (ref 135–145)

## 2022-12-12 LAB — SODIUM
Sodium: 151 mmol/L — ABNORMAL HIGH (ref 135–145)
Sodium: 149 mmol/L — ABNORMAL HIGH (ref 135–145)
Sodium: 147 mmol/L — ABNORMAL HIGH (ref 135–145)

## 2022-12-12 MED ORDER — DEXTROSE 5 % IV SOLN: INTRAVENOUS | Status: DC

## 2022-12-12 NOTE — Plan of Care (Signed)
  Problem: Clinical Measurements: Goal: Ability to maintain clinical measurements within normal limits will improve Outcome: Progressing Goal: Will remain free from infection Outcome: Progressing Goal: Respiratory complications will improve Outcome: Progressing   Problem: Pain Managment: Goal: General experience of comfort will improve Outcome: Progressing   Problem: Safety: Goal: Ability to remain free from injury will improve Outcome: Progressing   Problem: Nutrition: Goal: Adequate nutrition will be maintained Outcome: Not Progressing   Problem: Elimination: Goal: Will not experience complications related to urinary retention Outcome: Not Progressing

## 2022-12-12 NOTE — Progress Notes (Signed)
[facility] 2W08 [facility])  Patient is referred for hospice in facility on discharge. Liaison continues to follow peripherally and will be available to assist as needed.   Thank you for the opportunity to participate in this patient's care Thea Gist, BSN, RN [facility] hospital liaison [phone]

## 2022-12-12 NOTE — Progress Notes (Signed)
PROGRESS NOTE    Donna Simmons  AOZ:[id number] DOB: May 31, 1941 DOA: 08/25/2022 PCP: Yolanda Manges, DO   Brief Narrative:  82 yo h/o Alzheimer's dementia, hypertension, hyperlipidemia, and permanent A-fib on Eliquis admitted on 08/25/22 from SNF with failure to thrive, hypernatremia with a sodium of 167, poor oral intake.  Workup revealed UTI ESBL.  Patient treated with IV Ertapenem.  Currently awaiting long-term placement.  She could not return to SNF because of unpaid bills, family is unable to take care of her at home.  She is unable to qualify for Medicaid currently for financial reasons.   Pt currently with wounds and poor oral intake. Usually non verbal, non mobile. LOG was not accepted and can now not be offered due to Medicaid declining. CSW to consult DTP team to review for further assistance with disposition.     Assessment & Plan:   Failure to thrive End-stage Alzheimer's disease -Palliative care following, last seen on 4/7, had recommended patient's husband to consider comfort care.  -At baseline   Hypernatremia Oral intake poor, encourage p.o. diet -Continue dextrose 5% IV fluids.  Continue to monitor sodium level   Chronic atrial fibrillation -HR controlled, continue flecainide -Continue eliquis   ESBL UTI -Completed course of ertapenem   Acute kidney injury -Resolved, creatinine 1.19 on admission   Chronic iron deficiency anemia -H&H stable   Pressure injury documentation Sacrum stage IV Right hip, unstageable Left labia stage II -Wound care per nursing   Severe 18-calorie malnutrition Signs/Symptoms: severe muscle depletion, severe fat depletion Interventions: Ensure Enlive (each supplement provides 350kcal and 20 grams of protein), MVI  Thrombocytosis: -Platelet 473.  Continue to monitor  DVT prophylaxis: Eliquis Code Status: DNR Family Communication:  None present at bedside.   Disposition Plan: SNF   Patient is accepted to [facility],  facility awaiting upfront payment. Pt's husband is making arrangement to have the money transferred to the facility.  TOC is aware.  Plan is likely discharge on Monday  Consultants:  None  Procedures:  None  Status is: Inpatient   Subjective: Patient seen and examined.  RN at the bedside feeding patient.  Her heart rate trended up earlier this morning in 130s.  EKG was performed.  Patient was placed on daily.  Objective: Vitals:   12/12/22 0424 12/12/22 0445 12/12/22 0600 12/12/22 0806  BP: 99/84 107/76  108/70  Pulse: (!) 132 (!) 133 (!) 106 (!) 101  Resp: Temp: 98 F (36.7 C)   97.7 F (36.5 C)  TempSrc:      SpO2: 97%   100%  Weight:      Height:        Intake/Output Summary (Last 24 hours) at 12/12/2022 1041 Last data filed at 12/12/2022 0731 Gross per 24 hour  Intake 355.09 ml  Output 500 ml  Net -144.91 ml   Filed Weights   12/06/22 0340 12/09/22 0500 12/12/22 0422  Weight: 40.4 kg 40.4 kg 36.3 kg    Examination:  General exam: Appears calm and comfortable, elderly African-American female, cachectic, on room air, RN at bedside  respiratory system: Clear to auscultation. Respiratory effort normal. Cardiovascular system: S1 & S2 heard, RRR. No JVD, murmurs, rubs, gallops or clicks. No pedal edema. Gastrointestinal system: Abdomen is nondistended, soft and nontender. No organomegaly or masses felt. Normal bowel sounds heard. Central nervous system: Alert, nonverbal, has contractures in bilateral lower extremities,  Data Reviewed: I have personally reviewed following labs and imaging  studies  CBC: Recent Labs  Lab 12/12/22 0356  WBC 9.0  HGB 10.5*  HCT 34.4*  MCV 72.7*  PLT 473*   Basic Metabolic Panel: Recent Labs  Lab 12/07/22 0455 12/12/22 0356 12/12/22 0746  NA 142 152* 151*  K 3.6 3.7  --   CL 106 117*  --   CO2 24 25  --   GLUCOSE 110* 102*  --   BUN 28* 33*  --   CREATININE 0.53 0.59  --   CALCIUM 8.7* 8.8*  --     GFR: Estimated Creatinine Clearance: 32.7 mL/min (by C-G formula based on SCr of 0.59 mg/dL). Liver Function Tests: No results for input(s): "AST", "ALT", "ALKPHOS", "BILITOT", "PROT", "ALBUMIN" in the last 168 hours. No results for input(s): "LIPASE", "AMYLASE" in the last 168 hours. No results for input(s): "AMMONIA" in the last 168 hours. Coagulation Profile: No results for input(s): "INR", "PROTIME" in the last 168 hours. Cardiac Enzymes: No results for input(s): "CKTOTAL", "CKMB", "CKMBINDEX", "TROPONINI" in the last 168 hours. BNP (last 3 results) No results for input(s): "PROBNP" in the last 8760 hours. HbA1C: No results for input(s): "HGBA1C" in the last 72 hours. CBG: No results for input(s): "GLUCAP" in the last 168 hours. Lipid Profile: No results for input(s): "CHOL", "HDL", "LDLCALC", "TRIG", "CHOLHDL", "LDLDIRECT" in the last 72 hours. Thyroid Function Tests: No results for input(s): "TSH", "T4TOTAL", "FREET4", "T3FREE", "THYROIDAB" in the last 72 hours. Anemia Panel: No results for input(s): "VITAMINB12", "FOLATE", "FERRITIN", "TIBC", "IRON", "RETICCTPCT" in the last 72 hours. Sepsis Labs: No results for input(s): "PROCALCITON", "LATICACIDVEN" in the last 168 hours.  No results found for this or any previous visit (from the past 240 hour(s)).    Radiology Studies: No results found.  Scheduled Meds:  apixaban  2.5 mg Oral BID   Chlorhexidine Gluconate Cloth  6 each Topical Daily   feeding supplement  237 mL Oral TID BM   ferrous sulfate  325 mg Oral QODAY   flecainide  50 mg Oral BID   multivitamin with minerals  1 tablet Oral Daily   pantoprazole  40 mg Oral Daily   Continuous Infusions:  dextrose 75 mL/hr at 12/12/22 0731     LOS: 108 days   Time spent: 35 minutes   Jager Koska Estill Cotta, MD [facility]  If 7PM-7AM, please contact night-coverage [email or web address] 12/12/2022, 10:41 AM

## 2022-12-12 NOTE — Progress Notes (Signed)
0449 AM patient HR was 133 with BP 107/76. Patient was found resting in bed, not in distress. John Giovanni, MD was notified.  Stat EKG was performed, patient was placed on tele monitor per order. Started on 5% of dextrose per order.  Patient resting and not in distress. Current HR 109.  Darrielle Pflieger

## 2022-12-12 NOTE — Progress Notes (Signed)
Palliative Medicine Progress Note   Patient Name: Donna Simmons       Date: 12/12/2022 DOB: 06/22/1941  Age: 82 y.o. MRN#: [id number] Attending Physician: Ollen Bowl, MD Primary Care Physician: Yolanda Manges, DO Admit Date: 08/25/2022  Reason for Consultation/Follow-up: {Reason for Consult:23484}  HPI/Patient Profile: 82 yo F with advanced Alzheimer's HTN, Afib on Eliquis who presented with decreased responsiveness, found to have Na 167 and ESBL UTI.   Subjective: Chart reviewed.   Bedside visit. Patient appears to be resting comfortably. She briefly opens her eyes with stimulation but is not interactive with me.   I spoke with husband/Donna Simmons by phone. He confirms that plan is discharge to [facility] SNF for long-term care, likely on Monday. Discussed that Donna Simmons will have the addition of hospice support with [facility].   Objective:  Physical Exam          Vital Signs: BP 106/72 (BP Location: Right Arm)   Pulse (!) 101   Temp 97.7 F (36.5 C) (Oral)   Resp 16   Ht 5\' 2"  (1.575 m)   Wt 36.3 kg   SpO2 98%   BMI 14.63 kg/m  SpO2: SpO2: 98 % O2 Device: O2 Device: Room Air O2 Flow Rate: O2 Flow Rate (L/min): 0 L/min  Intake/output summary:  Intake/Output Summary (Last 24 hours) at 12/12/2022 1708 Last data filed at 12/12/2022 9826 Gross per 24 hour  Intake 355.09 ml  Output 250 ml  Net 105.09 ml    LBM: Last BM Date : 12/12/22     Palliative Assessment/Data: ***     Palliative Medicine Assessment & Plan   Assessment: Principal Problem:   Failure to thrive in adult Active Problems:   GERD (gastroesophageal reflux disease)   Hypoalbuminemia due to protein-calorie malnutrition   AKI (acute kidney injury)   Permanent atrial fibrillation   Alzheimer's  dementia   Pressure ulcer    Recommendations/Plan: ***  Goals of Care and Additional Recommendations: Limitations on Scope of Treatment: {Recommended Scope and Preferences:21019}  Code Status:   Prognosis:  {Palliative Care Prognosis:23504}  Discharge Planning: {Palliative dispostion:23505}  Care plan was discussed with ***  Thank you for allowing the Palliative Medicine Team to assist in the care of this patient.   ***   Merry Proud, NP   Please contact  Palliative Medicine Team phone at [phone] for questions and concerns.  For individual providers, please see AMION.

## 2022-12-13 DIAGNOSIS — R627 Adult failure to thrive: Secondary | ICD-10-CM | POA: Diagnosis not present

## 2022-12-13 LAB — BASIC METABOLIC PANEL
Anion gap: 13 (ref 5–15)
BUN: 29 mg/dL — ABNORMAL HIGH (ref 8–23)
CO2: 26 mmol/L (ref 22–32)
Calcium: 9 mg/dL (ref 8.9–10.3)
Chloride: 110 mmol/L (ref 98–111)
Creatinine, Ser: 0.61 mg/dL (ref 0.44–1.00)
GFR, Estimated: >60 mL/min (ref >60)
Glucose, Bld: 100 mg/dL — ABNORMAL HIGH (ref 70–99)
Potassium: 3.4 mmol/L — ABNORMAL LOW (ref 3.5–5.1)
Sodium: 149 mmol/L — ABNORMAL HIGH (ref 135–145)

## 2022-12-13 LAB — MAGNESIUM: Magnesium: 2.5 mg/dL — ABNORMAL HIGH (ref 1.7–2.4)

## 2022-12-13 MED ORDER — POTASSIUM CHLORIDE 20 MEQ PO PACK
40.0000 meq | PACK | Freq: Two times a day (BID) | ORAL | Status: AC
  Administered 2022-12-13 (×2): 40 meq via ORAL
  Filled 2022-12-13 (×2): qty 2

## 2022-12-13 MED ORDER — DEXTROSE 5 % IV SOLN: INTRAVENOUS | Status: DC

## 2022-12-13 NOTE — Progress Notes (Signed)
PROGRESS NOTE    Donna Simmons  TZG:[id number] DOB: Mar 25, 1941 DOA: 08/25/2022 PCP: Donna Manges, DO   Brief Narrative:  82 yo h/o Alzheimer's dementia, hypertension, hyperlipidemia, and permanent A-fib on Eliquis admitted on 08/25/22 from SNF with failure to thrive, hypernatremia with a sodium of 167, poor oral intake.  Workup revealed UTI ESBL.  Patient treated with IV Ertapenem.  Currently awaiting long-term placement.  She could not return to SNF because of unpaid bills, family is unable to take care of her at home.  She is unable to qualify for Medicaid currently for financial reasons.   Pt currently with wounds and poor oral intake. Usually non verbal, non mobile. LOG was not accepted and can now not be offered due to Medicaid declining. CSW to consult DTP team to review for further assistance with disposition.     Assessment & Plan:   Failure to thrive End-stage Alzheimer's disease -Palliative care following, last seen on 4/7, had recommended patient's husband to consider comfort care.  -At baseline   Hypernatremia -Oral intake poor, encourage p.o. diet -Continue dextrose 5% IV fluids.  Continue to monitor sodium level.  Improving  Chronic atrial fibrillation -HR controlled, continue flecainide -Continue eliquis   ESBL UTI -Completed course of ertapenem   Acute kidney injury -Resolved   Chronic iron deficiency anemia -H&H stable   Pressure injury documentation Sacrum stage IV Right hip, unstageable Left labia stage II -Wound care per nursing   Severe 18-calorie malnutrition Signs/Symptoms: severe muscle depletion, severe fat depletion Interventions: Ensure Enlive (each supplement provides 350kcal and 20 grams of protein), MVI  Thrombocytosis: -Platelet 473.  Continue to monitor  Hypokalemia: Replenished.  Check magnesium level.  Repeat BMP tomorrow a.m.  DVT prophylaxis: Eliquis Code Status: DNR Family Communication: Patient's husband present at the  bedside.  Plan of care discussed with patient's husband and he verbalized understanding.    Disposition Plan: SNF   Patient is accepted to [facility], facility awaiting upfront payment. Pt's husband is making arrangement to have the money transferred to the facility.  TOC is aware.  Plan is likely discharge on Monday  Consultants:  None  Procedures:  None  Status is: Inpatient   Subjective: Patient seen and examined.  Husband at the bedside.  Denies any new complaints.  No acute events overnight.  Objective: Vitals:   12/12/22 1539 12/12/22 2100 12/13/22 0625 12/13/22 0806  BP: 106/72 110/74 103/68 97/72  Pulse: (!) 101 100 92 (!) 102  Resp: 16 16 16    Temp: 97.7 F (36.5 C) 97.7 F (36.5 C) 97.7 F (36.5 C) 97.7 F (36.5 C)  TempSrc: Oral Oral  Oral  SpO2: 98% 98% 98% 95%  Weight:      Height:        Intake/Output Summary (Last 24 hours) at 12/13/2022 7591 Last data filed at 12/13/2022 6384 Gross per 24 hour  Intake 240 ml  Output 500 ml  Net -260 ml    Filed Weights   12/06/22 0340 12/09/22 0500 12/12/22 0422  Weight: 40.4 kg 40.4 kg 36.3 kg    Examination:  General exam: Appears calm and comfortable, elderly African-American female, cachectic, on room air, has been at the bedside  respiratory system: Clear to auscultation. Respiratory effort normal. Cardiovascular system: S1 & S2 heard, RRR. No JVD, murmurs, rubs, gallops or clicks. No pedal edema. Gastrointestinal system: Abdomen is nondistended, soft and nontender. No organomegaly or masses felt. Normal bowel sounds heard. Central nervous system: Alert, nonverbal,  has contractures in bilateral lower extremities,  Data Reviewed: I have personally reviewed following labs and imaging studies  CBC: Recent Labs  Lab 12/12/22 0356  WBC 9.0  HGB 10.5*  HCT 34.4*  MCV 72.7*  PLT 473*    Basic Metabolic Panel: Recent Labs  Lab 12/07/22 0455 12/12/22 0356 12/12/22 0746 12/12/22 1136  12/12/22 1557 12/13/22 0228  NA 142 152* 151* 149* 147* 149*  K 3.6 3.7  --   --   --  3.4*  CL 106 117*  --   --   --  110  CO2 24 25  --   --   --  26  GLUCOSE 110* 102*  --   --   --  100*  BUN 28* 33*  --   --   --  29*  CREATININE 0.53 0.59  --   --   --  0.61  CALCIUM 8.7* 8.8*  --   --   --  9.0    GFR: Estimated Creatinine Clearance: 32.7 mL/min (by C-G formula based on SCr of 0.61 mg/dL). Liver Function Tests: No results for input(s): "AST", "ALT", "ALKPHOS", "BILITOT", "PROT", "ALBUMIN" in the last 168 hours. No results for input(s): "LIPASE", "AMYLASE" in the last 168 hours. No results for input(s): "AMMONIA" in the last 168 hours. Coagulation Profile: No results for input(s): "INR", "PROTIME" in the last 168 hours. Cardiac Enzymes: No results for input(s): "CKTOTAL", "CKMB", "CKMBINDEX", "TROPONINI" in the last 168 hours. BNP (last 3 results) No results for input(s): "PROBNP" in the last 8760 hours. HbA1C: No results for input(s): "HGBA1C" in the last 72 hours. CBG: No results for input(s): "GLUCAP" in the last 168 hours. Lipid Profile: No results for input(s): "CHOL", "HDL", "LDLCALC", "TRIG", "CHOLHDL", "LDLDIRECT" in the last 72 hours. Thyroid Function Tests: No results for input(s): "TSH", "T4TOTAL", "FREET4", "T3FREE", "THYROIDAB" in the last 72 hours. Anemia Panel: No results for input(s): "VITAMINB12", "FOLATE", "FERRITIN", "TIBC", "IRON", "RETICCTPCT" in the last 72 hours. Sepsis Labs: No results for input(s): "PROCALCITON", "LATICACIDVEN" in the last 168 hours.  No results found for this or any previous visit (from the past 240 hour(s)).    Radiology Studies: No results found.  Scheduled Meds:  apixaban  2.5 mg Oral BID   Chlorhexidine Gluconate Cloth  6 each Topical Daily   feeding supplement  237 mL Oral TID BM   ferrous sulfate  325 mg Oral QODAY   flecainide  50 mg Oral BID   multivitamin with minerals  1 tablet Oral Daily   pantoprazole  40  mg Oral Daily   potassium chloride  40 mEq Oral BID   Continuous Infusions:     LOS: 109 days   Time spent: 35 minutes   Donna Landgrebe Estill Cotta, MD [facility]  If 7PM-7AM, please contact night-coverage [email or web address] 12/13/2022, 9:07 AM

## 2022-12-13 NOTE — Progress Notes (Signed)
  [facility] 2W08 [facility])   Patient is referred for hospice in facility at [facility] upon discharge. [facility] Liaison continues to follow peripherally and will be available to assist as needed.    Thank you for the opportunity to participate in this patient's care.  Haynes Bast, BSN, RN [facility] Liaison  [phone]

## 2022-12-13 NOTE — Plan of Care (Signed)
  Problem: Education: Goal: Knowledge of General Education information will improve Description: Including pain rating scale, medication(s)/side effects and non-pharmacologic comfort measures Outcome: Progressing   Problem: Clinical Measurements: Goal: Will remain free from infection Outcome: Progressing Goal: Respiratory complications will improve Outcome: Progressing   Problem: Coping: Goal: Level of anxiety will decrease Outcome: Progressing   Problem: Pain Managment: Goal: General experience of comfort will improve Outcome: Progressing   Problem: Safety: Goal: Ability to remain free from injury will improve Outcome: Progressing   Problem: Nutrition: Goal: Adequate nutrition will be maintained Outcome: Not Progressing

## 2022-12-14 DIAGNOSIS — R627 Adult failure to thrive: Secondary | ICD-10-CM | POA: Diagnosis not present

## 2022-12-14 LAB — BASIC METABOLIC PANEL
Anion gap: 8 (ref 5–15)
BUN: 33 mg/dL — ABNORMAL HIGH (ref 8–23)
CO2: 24 mmol/L (ref 22–32)
Calcium: 8.5 mg/dL — ABNORMAL LOW (ref 8.9–10.3)
Chloride: 116 mmol/L — ABNORMAL HIGH (ref 98–111)
Creatinine, Ser: 0.73 mg/dL (ref 0.44–1.00)
GFR, Estimated: >60 mL/min (ref >60)
Glucose, Bld: 127 mg/dL — ABNORMAL HIGH (ref 70–99)
Potassium: 3.6 mmol/L (ref 3.5–5.1)
Sodium: 148 mmol/L — ABNORMAL HIGH (ref 135–145)

## 2022-12-14 LAB — URINALYSIS, ROUTINE W REFLEX MICROSCOPIC
Bilirubin Urine: NEGATIVE
Glucose, UA: NEGATIVE mg/dL
Ketones, ur: NEGATIVE mg/dL
Nitrite: NEGATIVE
Protein, ur: 100 mg/dL — AB
RBC / HPF: >50 RBC/hpf (ref 0–5)
Specific Gravity, Urine: 1.027 (ref 1.005–1.030)
WBC, UA: >50 WBC/hpf (ref 0–5)
pH: 5 (ref 5.0–8.0)

## 2022-12-14 LAB — CBC
HCT: 34.5 % — ABNORMAL LOW (ref 36.0–46.0)
Hemoglobin: 10.6 g/dL — ABNORMAL LOW (ref 12.0–15.0)
MCH: 22.3 pg — ABNORMAL LOW (ref 26.0–34.0)
MCHC: 30.7 g/dL (ref 30.0–36.0)
MCV: 72.5 fL — ABNORMAL LOW (ref 80.0–100.0)
Platelets: 443 10*3/uL — ABNORMAL HIGH (ref 150–400)
RBC: 4.76 MIL/uL (ref 3.87–5.11)
RDW: 21.2 % — ABNORMAL HIGH (ref 11.5–15.5)
WBC: 10.6 10*3/uL — ABNORMAL HIGH (ref 4.0–10.5)
nRBC: 0 % (ref 0.0–0.2)

## 2022-12-14 LAB — MAGNESIUM: Magnesium: 2.7 mg/dL — ABNORMAL HIGH (ref 1.7–2.4)

## 2022-12-14 MED ORDER — METOPROLOL TARTRATE 5 MG/5ML IV SOLN: 2.5000 mg | Freq: Once | INTRAVENOUS | Status: DC | PRN

## 2022-12-14 MED ORDER — METOPROLOL TARTRATE 12.5 MG HALF TABLET
12.5000 mg | ORAL_TABLET | Freq: Two times a day (BID) | ORAL | Status: DC
  Administered 2022-12-14 – 2022-12-19 (×8): 12.5 mg via ORAL
  Filled 2022-12-14 (×10): qty 1

## 2022-12-14 NOTE — Progress Notes (Signed)
PROGRESS NOTE    Donna Simmons  AGT:[id number] DOB: [birth year] DOA: 08/25/2022 PCP: Yolanda Manges, DO   Brief Narrative:  82 yo h/o Alzheimer's dementia, hypertension, hyperlipidemia, and permanent A-fib on Eliquis admitted on 08/25/22 from SNF with failure to thrive, hypernatremia with a sodium of 167, poor oral intake.  Workup revealed UTI ESBL.  Patient treated with IV Ertapenem.  Currently awaiting long-term placement.  She could not return to SNF because of unpaid bills, family is unable to take care of her at home.  She is unable to qualify for Medicaid currently for financial reasons.   Pt currently with wounds and poor oral intake. Usually non verbal, non mobile. LOG was not accepted and can now not be offered due to Medicaid declining. CSW to consult DTP team to review for further assistance with disposition.  On 4/13 developed sinus tachycardia and has been remaining in the 120-130s    Assessment & Plan:   Failure to thrive End-stage Alzheimer's disease -Palliative care following, last seen on 4/7, had recommended patient's husband to consider comfort care-- hospice to follow at SNF -continue poor PO intake   Hypernatremia -Oral intake poor, encourage p.o. diet -Continue dextrose 5% IV fluids.  Continue to monitor sodium level  Chronic atrial fibrillation/sinus tachy -HR controlled, continue flecainide -add low dose BB -Continue eliquis   ESBL UTI -Completed course of ertapenem -exchange foley and recheck U/A as source of her tachycardia as her WBCs are trending up   Acute kidney injury -Resolved   Chronic iron deficiency anemia -H&H stable   Pressure injury documentation Sacrum stage IV Right hip, unstageable Left labia stage II -Wound care per nursing   Severe 18-calorie malnutrition Signs/Symptoms: severe muscle depletion, severe fat depletion Interventions: Ensure Enlive (each supplement provides 350kcal and 20 grams of protein),  MVI  Thrombocytosis: -Platelet 473.  Continue to monitor  Hypokalemia: Replenished.  Check magnesium level.  Repeat BMP tomorrow a.m.  DVT prophylaxis: Eliquis Code Status: DNR Family Communication: Patient's husband present at the bedside.  Plan of care discussed with patient's husband and he verbalized understanding.    Disposition Plan: SNF   Patient is accepted to [facility], facility awaiting upfront payment. Pt's husband is making arrangement to have the money transferred to the facility.  TOC following  Consultants:  Palliative care  Status is: Inpatient   Subjective: Developed sinus tachy overnight  Objective: Vitals:   12/13/22 2303 12/13/22 2333 12/14/22 0500 12/14/22 0526  BP: 109/68 101/66  (!) 112/96  Pulse: (!) 122 (!) 137  (!) 109  Resp: 16 16  16   Temp: 98.2 F (36.8 C) 99.2 F (37.3 C)  98.2 F (36.8 C)  TempSrc: Axillary     SpO2: 97% 97%  98%  Weight:   36.3 kg   Height:        Intake/Output Summary (Last 24 hours) at 12/14/2022 0802 Last data filed at 12/14/2022 2248 Gross per 24 hour  Intake 1212.15 ml  Output 500 ml  Net 712.15 ml   Filed Weights   12/09/22 0500 12/12/22 0422 12/14/22 0500  Weight: 40.4 kg 36.3 kg 36.3 kg    Examination:   General: Appearance:    Cachectic female in no acute distress- non verbal     Lungs:     respirations unlabored  Heart:    Tachycardic.      Neurologic:   Awake but non-verbal     Data Reviewed: I have personally reviewed following labs and imaging  studies  CBC: Recent Labs  Lab 12/12/22 0356 12/14/22 0415  WBC 9.0 10.6*  HGB 10.5* 10.6*  HCT 34.4* 34.5*  MCV 72.7* 72.5*  PLT 473* 443*   Basic Metabolic Panel: Recent Labs  Lab 12/12/22 0356 12/12/22 0746 12/12/22 1136 12/12/22 1557 12/13/22 0228 12/14/22 0415  NA 152* 151* 149* 147* 149* 148*  K 3.7  --   --   --  3.4* 3.6  CL 117*  --   --   --  110 116*  CO2 25  --   --   --  26 24  GLUCOSE 102*  --   --   --  100*  127*  BUN 33*  --   --   --  29* 33*  CREATININE 0.59  --   --   --  0.61 0.73  CALCIUM 8.8*  --   --   --  9.0 8.5*  MG  --   --   --   --  2.5* 2.7*   GFR: Estimated Creatinine Clearance: 32.7 mL/min (by C-G formula based on SCr of 0.73 mg/dL). Liver Function Tests: No results for input(s): "AST", "ALT", "ALKPHOS", "BILITOT", "PROT", "ALBUMIN" in the last 168 hours. No results for input(s): "LIPASE", "AMYLASE" in the last 168 hours. No results for input(s): "AMMONIA" in the last 168 hours. Coagulation Profile: No results for input(s): "INR", "PROTIME" in the last 168 hours. Cardiac Enzymes: No results for input(s): "CKTOTAL", "CKMB", "CKMBINDEX", "TROPONINI" in the last 168 hours. BNP (last 3 results) No results for input(s): "PROBNP" in the last 8760 hours. HbA1C: No results for input(s): "HGBA1C" in the last 72 hours. CBG: No results for input(s): "GLUCAP" in the last 168 hours. Lipid Profile: No results for input(s): "CHOL", "HDL", "LDLCALC", "TRIG", "CHOLHDL", "LDLDIRECT" in the last 72 hours. Thyroid Function Tests: No results for input(s): "TSH", "T4TOTAL", "FREET4", "T3FREE", "THYROIDAB" in the last 72 hours. Anemia Panel: No results for input(s): "VITAMINB12", "FOLATE", "FERRITIN", "TIBC", "IRON", "RETICCTPCT" in the last 72 hours. Sepsis Labs: No results for input(s): "PROCALCITON", "LATICACIDVEN" in the last 168 hours.  No results found for this or any previous visit (from the past 240 hour(s)).    Radiology Studies: No results found.  Scheduled Meds:  apixaban  2.5 mg Oral BID   Chlorhexidine Gluconate Cloth  6 each Topical Daily   feeding supplement  237 mL Oral TID BM   ferrous sulfate  325 mg Oral QODAY   flecainide  50 mg Oral BID   metoprolol tartrate  12.5 mg Oral BID   multivitamin with minerals  1 tablet Oral Daily   pantoprazole  40 mg Oral Daily   Continuous Infusions:  dextrose 75 mL/hr at 12/14/22 0622     LOS: 110 days   Time spent: 35  minutes   Joseph Art, DO [facility]  If 7PM-7AM, please contact night-coverage [email or web address] 12/14/2022, 8:02 AM

## 2022-12-14 NOTE — Progress Notes (Signed)
[facility] 2W08 [facility] Liaison Note  Patient is referred for hospice in facility at [facility] upon discharge. [facility] Liaison continues to follow peripherally and will be available to assist as needed.  Thank you for the opportunity to participate in this patients care.  Haynes Bast, BSN, RN [facility] Liaison [phone]

## 2022-12-15 DIAGNOSIS — R627 Adult failure to thrive: Secondary | ICD-10-CM | POA: Diagnosis not present

## 2022-12-15 LAB — BASIC METABOLIC PANEL
Anion gap: 10 (ref 5–15)
BUN: 27 mg/dL — ABNORMAL HIGH (ref 8–23)
CO2: 23 mmol/L (ref 22–32)
Calcium: 8.5 mg/dL — ABNORMAL LOW (ref 8.9–10.3)
Chloride: 108 mmol/L (ref 98–111)
Creatinine, Ser: 0.6 mg/dL (ref 0.44–1.00)
GFR, Estimated: >60 mL/min (ref >60)
Glucose, Bld: 119 mg/dL — ABNORMAL HIGH (ref 70–99)
Potassium: 3.3 mmol/L — ABNORMAL LOW (ref 3.5–5.1)
Sodium: 141 mmol/L (ref 135–145)

## 2022-12-15 LAB — CBC
HCT: 32.9 % — ABNORMAL LOW (ref 36.0–46.0)
Hemoglobin: 10.1 g/dL — ABNORMAL LOW (ref 12.0–15.0)
MCH: 22.2 pg — ABNORMAL LOW (ref 26.0–34.0)
MCHC: 30.7 g/dL (ref 30.0–36.0)
MCV: 72.3 fL — ABNORMAL LOW (ref 80.0–100.0)
Platelets: 405 10*3/uL — ABNORMAL HIGH (ref 150–400)
RBC: 4.55 MIL/uL (ref 3.87–5.11)
RDW: 20.8 % — ABNORMAL HIGH (ref 11.5–15.5)
WBC: 8.4 10*3/uL (ref 4.0–10.5)
nRBC: 0 % (ref 0.0–0.2)

## 2022-12-15 MED ORDER — POTASSIUM CHLORIDE 10 MEQ/100ML IV SOLN
10.0000 meq | INTRAVENOUS | Status: AC
  Administered 2022-12-15 (×2): 10 meq via INTRAVENOUS
  Filled 2022-12-15 (×2): qty 100

## 2022-12-15 NOTE — Progress Notes (Signed)
PROGRESS NOTE Donna Simmons  WLS:[id number] DOB: 16-Apr-1941 DOA: 08/25/2022 PCP: Donna Manges, DO  Brief Narrative/Hospital Course: Donna Simmons is a 82 y.o. F with advanced Alzheimer's dementia, HTN, permAF on Eliquis, sent from rehab center with decreased responsiveness, found to have Na 167 and ESBL UTI.   Patient was treated with IV Ertapenem.She could not return to SNF because of unpaid bills, family is unable to take care of her at home.  She is unable to qualify for Medicaid currently for financial reasons.   Pt currently with wounds and poor oral intake. Usually non verbal, non mobile.CSW following closely, TOC has been discussing with patient and family, planning for [facility] hospice to follow-up at the facility-with plan to discharge to LTC facility. Significant events:12/25: Admitted from SNF on ertapenem, hypotonic fluids 1/3: Sodium corrected, completed Abx, medically ready but could not return to SNF due to unpaid bills 1/4-present:working with TOC to arrange safe disposition  On 4/13 patient developed sinus tachycardia and has been upto 120s 130     Subjective: Patient seen and examined.  Husband at the bedside PATIENT IS Alert awake oriented x 0-1 Overnight afebrile SBP soft 93-110 Labs significant for potassium 3.3 sodium improved to 151, CBC with chronic microcytic anemia hemoglobin 10.1 g per, thrombocytosis 845 K Noted From 4/14 UA: leukocyte large, WBC more than 50 RBC more than 50 many bacteria   Assessment and Plan: Principal Problem:   Failure to thrive in adult Active Problems:   GERD (gastroesophageal reflux disease)   Hypoalbuminemia due to protein-calorie malnutrition   AKI (acute kidney injury)   Permanent atrial fibrillation   Alzheimer's dementia   Pressure ulcer   Adult failure to thrive etiology much disease: Alzheimer's dementia end-stage: Seen by palliative care, currently on supportive care, husband involved in the care and has been  recommended to consider hospice/comfort care and planning to have hospice follow-up at the facility ,continues to have poor oral intake and failure to thrive   Hypernatremia: Resolved, on hypotonic fluid  Chronic atrial fibrillation/sinus tachyCARDIA: currently controlled on flecainide, metoprolol 12.5 twice daily and Eliquis  ESBL OTL:[id number] course of ertapenem, recheck UA 4/14 with pyuria but overall no leukocytosis afebrile    AKI: Resolved, at risk of recurrent AKI    Chronic iron deficiency anemia:H&H stable   Pressure injury multiple sites as below  Sacrum stage IV Right hip, unstageable Left labia stage II Continue wound care per nursing  Thrombocytosis:Platelet downtrending.  Monitor    Hypokalemia: Replete.    Goals of care DNR: Due to failure to thrive advanced dementia at risk of decompensation and readmission, plan is to follow-up hospice at the facility  Examination  Severe malnutrition augment diet as below Nutrition Problem: Severe Malnutrition Etiology: chronic illness Signs/Symptoms: severe muscle depletion, severe fat depletion Interventions: Ensure Enlive (each supplement provides 350kcal and 20 grams of protein), MVI   DVT prophylaxis: apixaban (ELIQUIS) tablet 2.5 mg Start: 12/03/22 2200 SCDs Start: 08/25/22 0659 Code Status:   Code Status: DNR Family Communication: plan of care discussed with patient at bedside. Patient status is: Inpatient because of unsafe disposition Level of care: Telemetry Medical   Dispo: Anticipated disposition: nh W/ hospice care once husband makes arrangement for payment to the facility  Objective: Vitals last 24 hrs: Vitals:   12/14/22 2000 12/15/22 0448 12/15/22 0500 12/15/22 0754  BP: 97/73 (!) 93/58  (!) 133/112  Pulse: 99 96  60  Resp: 16 16  18   Temp: 98.6 F (37 C)  97.9 F (36.6 C)  (!) 97.5 F (36.4 C)  TempSrc: Tympanic Tympanic    SpO2: 99% 98%  99%  Weight:   36.1 kg   Height:       Weight change:  -0.188 kg  Physical Examination: General exam: cachectic, alert awake, oriented x 1, ill and frail. HEENT:Oral mucosa dry, Ear/Nose WNL grossly Respiratory system: bilaterally clear BS, no use of accessory muscle Cardiovascular system: S1 & S2 +, No JVD. Gastrointestinal system: Abdomen soft,NT,ND, BS+ Nervous System:Alert, awake, moving extremities. Extremities: LE edema neg,distal peripheral pulses palpable.  Skin: No rashes,no icterus. MSK: Normal muscle bulk,tone, power  Medications reviewed:  Scheduled Meds:  apixaban  2.5 mg Oral BID   Chlorhexidine Gluconate Cloth  6 each Topical Daily   feeding supplement  237 mL Oral TID BM   ferrous sulfate  325 mg Oral QODAY   flecainide  50 mg Oral BID   metoprolol tartrate  12.5 mg Oral BID   multivitamin with minerals  1 tablet Oral Daily   pantoprazole  40 mg Oral Daily   Continuous Infusions:  dextrose 75 mL/hr at 12/15/22 0920    Pressure Injury 07/01/22 Coccyx Right;Left Stage 2 -  Partial thickness loss of dermis presenting as a shallow open injury with a red, pink wound bed without slough. (Active)  07/01/22 1405  Location: Coccyx  Location Orientation: Right;Left  Staging: Stage 2 -  Partial thickness loss of dermis presenting as a shallow open injury with a red, pink wound bed without slough.  Wound Description (Comments):   Present on Admission: Yes     Pressure Injury 09/04/22 Sacrum Stage 4 - Full thickness tissue loss with exposed bone, tendon or muscle. 3 cms x 3 cms x 1 cm (Active)  09/04/22 1030  Location: Sacrum  Location Orientation:   Staging: Stage 4 - Full thickness tissue loss with exposed bone, tendon or muscle.  Wound Description (Comments): 3 cms x 3 cms x 1 cm  Present on Admission: No  Dressing Type Foam - Lift dressing to assess site every shift 12/14/22 2200     Pressure Injury 09/30/22 Hip Right Unstageable - Full thickness tissue loss in which the base of the injury is covered by slough (yellow,  tan, gray, green or brown) and/or eschar (tan, brown or black) in the wound bed. 2 cms x 1 cm x 0.1 cms 80% red mois (Active)  09/30/22 1200  Location: Hip  Location Orientation: Right  Staging: Unstageable - Full thickness tissue loss in which the base of the injury is covered by slough (yellow, tan, gray, green or brown) and/or eschar (tan, brown or black) in the wound bed.  Wound Description (Comments): 2 cms x 1 cm x 0.1 cms 80% red moist 20% yellow  Present on Admission: No  Dressing Type Foam - Lift dressing to assess site every shift 12/14/22 2200     Pressure Injury 10/20/22 Labia Left Stage 2 -  Partial thickness loss of dermis presenting as a shallow open injury with a red, pink wound bed without slough. Abrasion on left labia (Active)  10/20/22 2017  Location: Labia  Location Orientation: Left  Staging: Stage 2 -  Partial thickness loss of dermis presenting as a shallow open injury with a red, pink wound bed without slough.  Wound Description (Comments): Abrasion on left labia  Present on Admission: No  Dressing Type Foam - Lift dressing to assess site every shift 12/14/22 0830   Diet Order  DIET - DYS 1 Room service appropriate? Yes with Assist; Fluid consistency: Thin  Diet effective now                    Nutrition Problem: Severe Malnutrition Etiology: chronic illness Signs/Symptoms: severe muscle depletion, severe fat depletion Interventions: Ensure Enlive (each supplement provides 350kcal and 20 grams of protein), MVI   Intake/Output Summary (Last 24 hours) at 12/15/2022 1235 Last data filed at 12/15/2022 0900 Gross per 24 hour  Intake 150 ml  Output 800 ml  Net -650 ml   Net IO Since Admission: 30,018.74 mL [12/15/22 1235]  Wt Readings from Last 3 Encounters:  12/15/22 36.1 kg  07/03/22 50.2 kg  04/24/22 54.4 kg     Unresulted Labs (From admission, onward)    None     Data Reviewed: I have personally reviewed following labs and imaging  studies CBC: Recent Labs  Lab 12/12/22 0356 12/14/22 0415 12/15/22 0410  WBC 9.0 10.6* 8.4  HGB 10.5* 10.6* 10.1*  HCT 34.4* 34.5* 32.9*  MCV 72.7* 72.5* 72.3*  PLT 473* 443* 405*   Basic Metabolic Panel: Recent Labs  Lab 12/12/22 0356 12/12/22 0746 12/12/22 1136 12/12/22 1557 12/13/22 0228 12/14/22 0415 12/15/22 0410  NA 152*   < > 149* 147* 149* 148* 141  K 3.7  --   --   --  3.4* 3.6 3.3*  CL 117*  --   --   --  110 116* 108  CO2 25  --   --   --  GLUCOSE 102*  --   --   --  100* 127* 119*  BUN 33*  --   --   --  29* 33* 27*  CREATININE 0.59  --   --   --  0.61 0.73 0.60  CALCIUM 8.8*  --   --   --  9.0 8.5* 8.5*  MG  --   --   --   --  2.5* 2.7*  --    < > = values in this interval not displayed.  No results found for this or any previous visit (from the past 240 hour(s)).  Antimicrobials: Anti-infectives (From admission, onward)    Start     Dose/Rate Route Frequency Ordered Stop   11/28/22 2345  cefTRIAXone (ROCEPHIN) 1 g in sodium chloride 0.9 % 100 mL IVPB  Status:  Discontinued        1 g 200 mL/hr over 30 Minutes Intravenous Every 24 hours 11/28/22 2252 11/28/22 2254   09/03/22 1000  ertapenem (INVANZ) 1,000 mg in sodium chloride 0.9 % 100 mL IVPB        1 g 200 mL/hr over 30 Minutes Intravenous Every 24 hours 09/02/22 1421 09/04/22 2048   08/30/22 1500  ertapenem (INVANZ) 1,000 mg in sodium chloride 0.9 % 100 mL IVPB  Status:  Discontinued        1 g 200 mL/hr over 30 Minutes Intravenous Every 24 hours 08/30/22 1419 09/02/22 1421   08/29/22 1115  cefTRIAXone (ROCEPHIN) 1 g in sodium chloride 0.9 % 100 mL IVPB  Status:  Discontinued        1 g 200 mL/hr over 30 Minutes Intravenous Every 24 hours 08/29/22 1017 08/30/22 1419      Culture/Microbiology    Component Value Date/Time   SDES BLOOD RIGHT ARM 08/29/2022 1930   SPECREQUEST  08/29/2022 1930    BOTTLES DRAWN AEROBIC AND ANAEROBIC Blood Culture adequate volume  CULT  08/29/2022 1930     NO GROWTH 5 DAYS Performed at [facility], [address], [place], [place] [address]    REPTSTATUS 09/03/2022 FINAL 08/29/2022 1930  Radiology Studies: No results found.   LOS: 111 days   Lanae Boast, MD [facility]  12/15/2022, 12:35 PM

## 2022-12-16 DIAGNOSIS — R627 Adult failure to thrive: Secondary | ICD-10-CM | POA: Diagnosis not present

## 2022-12-16 LAB — BASIC METABOLIC PANEL
Anion gap: 9 (ref 5–15)
BUN: 19 mg/dL (ref 8–23)
CO2: 24 mmol/L (ref 22–32)
Calcium: 8.2 mg/dL — ABNORMAL LOW (ref 8.9–10.3)
Chloride: 104 mmol/L (ref 98–111)
Creatinine, Ser: 0.49 mg/dL (ref 0.44–1.00)
GFR, Estimated: >60 mL/min (ref >60)
Glucose, Bld: 95 mg/dL (ref 70–99)
Potassium: 2.9 mmol/L — ABNORMAL LOW (ref 3.5–5.1)
Sodium: 137 mmol/L (ref 135–145)

## 2022-12-16 MED ORDER — POTASSIUM CHLORIDE 20 MEQ PO PACK
40.0000 meq | PACK | Freq: Once | ORAL | Status: AC
  Administered 2022-12-16: 40 meq via ORAL
  Filled 2022-12-16: qty 2

## 2022-12-16 MED ORDER — SODIUM CHLORIDE 0.9 % IV BOLUS
250.0000 mL | Freq: Once | INTRAVENOUS | Status: AC
  Administered 2022-12-16: 250 mL via INTRAVENOUS

## 2022-12-16 MED ORDER — POTASSIUM CHLORIDE 10 MEQ/100ML IV SOLN
10.0000 meq | INTRAVENOUS | Status: AC
  Administered 2022-12-16 (×4): 10 meq via INTRAVENOUS
  Filled 2022-12-16 (×4): qty 100

## 2022-12-16 NOTE — TOC Progression Note (Signed)
Transition of Care [facility]) - Progression Note    Patient Details  Name: Donna Simmons MRN: [id number] Date of Birth: 1941-02-03  Transition of Care [facility]) CM/SW Contact  Corlene Sabia A [place], [place] Phone Number: 12/16/2022, 9:01 AM  Clinical Narrative:     CSW contacted pt's husband, Alanea Woolridge, via phone while at pt's bedside. He informed CSW that he is still waiting on his funds to transfer to his account and has not paid the admission fee to [facility]. CSW will follow up tomorrow to inquire about funds.     Barriers to Discharge: Financial Resources, Inadequate or no insurance, No SNF bed, SNF Authorization Denied, SNF Pending Medicaid RWB, Unsafe home situation, SNF Pending payor source - LOG, SNF Pending bed offer, Barriers Unresolved (comment)  Expected Discharge Plan and Services     Post Acute Care Choice: Nursing Home Living arrangements for the past 2 months: Skilled Nursing Facility (SNF then hospital for 88 days)                                       Social Determinants of Health (SDOH) Interventions SDOH Screenings   Food Insecurity: Patient Declined (10/19/2022)  Alcohol Screen: Low Risk  (04/12/2019)  Depression (PHQ2-9): Low Risk  (11/05/2020)  Tobacco Use: Low Risk  (11/22/2022)    Readmission Risk Interventions     No data to display

## 2022-12-16 NOTE — TOC Progression Note (Signed)
Transition of Care [facility]) - Progression Note    Patient Details  Name: FARRON LAFOND MRN: [id number] Date of Birth: [birth year]  Transition of Care [facility]) CM/SW Contact  Tacoya Altizer A [place], [place] Phone Number: 12/16/2022, 9:05 AM  Clinical Narrative:     CSW was informed by Whitney Post at [facility] that pt's husband, Sheliah Fiorillo had not yet sent admission funds to facility. She stated she would contact CSW once funds had been transferred.     Barriers to Discharge: Financial Resources, Inadequate or no insurance, No SNF bed, SNF Authorization Denied, SNF Pending Medicaid RWB, Unsafe home situation, SNF Pending payor source - LOG, SNF Pending bed offer, Barriers Unresolved (comment)  Expected Discharge Plan and Services     Post Acute Care Choice: Nursing Home Living arrangements for the past 2 months: Skilled Nursing Facility (SNF then hospital for 88 days)                                       Social Determinants of Health (SDOH) Interventions SDOH Screenings   Food Insecurity: Patient Declined (10/19/2022)  Alcohol Screen: Low Risk  (04/12/2019)  Depression (PHQ2-9): Low Risk  (11/05/2020)  Tobacco Use: Low Risk  (11/22/2022)    Readmission Risk Interventions     No data to display

## 2022-12-16 NOTE — Progress Notes (Signed)
PROGRESS NOTE Donna Simmons  ZOX:[id number] DOB: 1941/06/05 DOA: 08/25/2022 PCP: Yolanda Manges, DO  Brief Narrative/Hospital Course: Donna Simmons is a 82 y.o. F with advanced Alzheimer's dementia, HTN, permAF on Eliquis, sent from rehab center with decreased responsiveness, found to have Na 167 and ESBL UTI.   Patient was treated with IV Ertapenem.She could not return to SNF because of unpaid bills, family is unable to take care of her at home.  She is unable to qualify for Medicaid currently for financial reasons.   Pt currently with wounds and poor oral intake. Usually non verbal, non mobile.CSW following closely, TOC has been discussing with patient and family, planning for [facility] hospice to follow-up at the facility-with plan to discharge to LTC facility. Significant events:12/25: Admitted from SNF on ertapenem, hypotonic fluids 1/3: Sodium corrected, completed Abx, medically ready but could not return to SNF due to unpaid bills 1/4-present:working with TOC to arrange safe disposition  On 4/13 patient developed sinus tachycardia and has been upto 120s 130 At this time blood pressure was soft and improved with fluids, heart rate is stable.  She appears more alert awake.  Plan is for discharge to [facility] with hospice services     Subjective: Seen this am Husband at bedside She is more alert awake this am  Assessment and Plan: Principal Problem:   Failure to thrive in adult Active Problems:   GERD (gastroesophageal reflux disease)   Hypoalbuminemia due to protein-calorie malnutrition   AKI (acute kidney injury)   Permanent atrial fibrillation   Alzheimer's dementia   Pressure ulcer   Adult failure to thrive etiology much disease: Alzheimer's dementia end-stage: Seen by palliative care.Husband involved in the care and has been recommended to consider hospice/comfort care and planning to have hospice follow-up at the facility ,continues to have poor oral intake and  failure to thrive.   Hypernatremia: Resolved, on hypotonic fluid> wean off ivf.  Chronic atrial fibrillation/sinus tachyCARDIA: currently controlled on flecainide, metoprolol 12.5 twice daily and Eliquis  ESBL WJX:BJYNWGNFA ertapenem, recheck UA 4/14 with pyuria but overall no leukocytosis afebrile . Has foley in place likely causing abnormal ua.   AKI: Resolved, at risk of recurrent AKI    Hypotension:BP soft in 80s given IV bolus and BP has improved  Chronic iron deficiency anemia:H&H stable   Pressure injury multiple sites as below  Sacrum stage IV Right hip, unstageable Left labia stage II Continue wound care per nursing  Thrombocytosis:Platelet downtrending.  Monitor    Hypokalemia:  AGAIN W/ low potassium we will repeat aggressively   Goals of care DNR: Due to failure to thrive advanced dementia at risk of decompensation and readmission, plan is to follow-up hospice at the facility  Examination  Severe malnutrition augment diet as below Nutrition Problem: Severe Malnutrition Etiology: chronic illness Signs/Symptoms: severe muscle depletion, severe fat depletion Interventions: Ensure Enlive (each supplement provides 350kcal and 20 grams of protein), MVI   DVT prophylaxis: apixaban (ELIQUIS) tablet 2.5 mg Start: 12/03/22 2200 SCDs Start: 08/25/22 0659 Code Status:   Code Status: DNR Family Communication: plan of care discussed with patient at bedside. Patient status is: Inpatient because of unsafe disposition Level of care: Telemetry Medical   Dispo: Anticipated disposition: nh W/ hospice care once husband makes arrangement for payment to the facility Potassium is very low today being replaced aggressively p.o. and IV> HOLD OFF Discharge today  Objective: Vitals last 24 hrs: Vitals:   12/16/22 0556 12/16/22 0749 12/16/22 0907 12/16/22 1208  BP: (!) 109/97 (!) 84/56 94/67 106/72  Pulse: 87 88 85 89  Resp:   16   Temp:  97.6 F (36.4 C) 98.4 F (36.9 C) 98.2 F  (36.8 C)  TempSrc:  Oral Oral   SpO2:  97% 100% 98%  Weight:      Height:       Weight change: 0.641 kg  Physical Examination: General exam: AA, weak,older appearing HEENT:Oral mucosa moist, Ear/Nose WNL grossly, dentition normal. Respiratory system: bilaterally clear BS, no use of accessory muscle Cardiovascular system: S1 & S2 +, regular rate,. Gastrointestinal system: Abdomen soft, NT,ND,BS+ Nervous System:Alert, awake, moving extremities and grossly nonfocal Extremities: LE ankle edema neg, lower extremities warm Skin: No rashes,no icterus. MSK: in fetal position  Medications reviewed:  Scheduled Meds:  apixaban  2.5 mg Oral BID   Chlorhexidine Gluconate Cloth  6 each Topical Daily   feeding supplement  237 mL Oral TID BM   ferrous sulfate  325 mg Oral QODAY   flecainide  50 mg Oral BID   metoprolol tartrate  12.5 mg Oral BID   multivitamin with minerals  1 tablet Oral Daily   pantoprazole  40 mg Oral Daily   Continuous Infusions:  dextrose 75 mL/hr at 12/16/22 4098   Diet Order             DIET - DYS 1 Room service appropriate? Yes with Assist; Fluid consistency: Thin  Diet effective now                    Nutrition Problem: Severe Malnutrition Etiology: chronic illness Signs/Symptoms: severe muscle depletion, severe fat depletion Interventions: Ensure Enlive (each supplement provides 350kcal and 20 grams of protein), MVI   Intake/Output Summary (Last 24 hours) at 12/16/2022 1254 Last data filed at 12/16/2022 1191 Gross per 24 hour  Intake 1013.55 ml  Output 300 ml  Net 713.55 ml    Net IO Since Admission: 30,732.29 mL [12/16/22 1254]  Wt Readings from Last 3 Encounters:  12/16/22 36.7 kg  07/03/22 50.2 kg  04/24/22 54.4 kg     Unresulted Labs (From admission, onward)    None     Data Reviewed: I have personally reviewed following labs and imaging studies CBC: Recent Labs  Lab 12/12/22 0356 12/14/22 0415 12/15/22 0410  WBC 9.0 10.6*  8.4  HGB 10.5* 10.6* 10.1*  HCT 34.4* 34.5* 32.9*  MCV 72.7* 72.5* 72.3*  PLT 473* 443* 405*    Basic Metabolic Panel: Recent Labs  Lab 12/12/22 0356 12/12/22 0746 12/12/22 1557 12/13/22 0228 12/14/22 0415 12/15/22 0410 12/16/22 1140  NA 152*   < > 147* 149* 148* 141 137  K 3.7  --   --  3.4* 3.6 3.3* 2.9*  CL 117*  --   --  110 116* 108 104  CO2 25  --   --  GLUCOSE 102*  --   --  100* 127* 119* 95  BUN 33*  --   --  29* 33* 27* 19  CREATININE 0.59  --   --  0.61 0.73 0.60 0.49  CALCIUM 8.8*  --   --  9.0 8.5* 8.5* 8.2*  MG  --   --   --  2.5* 2.7*  --   --    < > = values in this interval not displayed.   No results found for this or any previous visit (from the past 240 hour(s)).  Antimicrobials: Anti-infectives (From admission,  onward)    Start     Dose/Rate Route Frequency Ordered Stop   11/28/22 2345  cefTRIAXone (ROCEPHIN) 1 g in sodium chloride 0.9 % 100 mL IVPB  Status:  Discontinued        1 g 200 mL/hr over 30 Minutes Intravenous Every 24 hours 11/28/22 2252 11/28/22 2254   09/03/22 1000  ertapenem (INVANZ) 1,000 mg in sodium chloride 0.9 % 100 mL IVPB        1 g 200 mL/hr over 30 Minutes Intravenous Every 24 hours 09/02/22 1421 09/04/22 2048   08/30/22 1500  ertapenem (INVANZ) 1,000 mg in sodium chloride 0.9 % 100 mL IVPB  Status:  Discontinued        1 g 200 mL/hr over 30 Minutes Intravenous Every 24 hours 08/30/22 1419 09/02/22 1421   08/29/22 1115  cefTRIAXone (ROCEPHIN) 1 g in sodium chloride 0.9 % 100 mL IVPB  Status:  Discontinued        1 g 200 mL/hr over 30 Minutes Intravenous Every 24 hours 08/29/22 1017 08/30/22 1419      Culture/Microbiology    Component Value Date/Time   SDES BLOOD RIGHT ARM 08/29/2022 1930   SPECREQUEST  08/29/2022 1930    BOTTLES DRAWN AEROBIC AND ANAEROBIC Blood Culture adequate volume   CULT  08/29/2022 1930    NO GROWTH 5 DAYS Performed at [facility], [address], [place], [place] [address]     REPTSTATUS 09/03/2022 FINAL 08/29/2022 1930  Radiology Studies: No results found.   LOS: 112 days   Donna Boast, MD [facility]  12/16/2022, 12:54 PM

## 2022-12-16 NOTE — Consult Note (Signed)
WOC Nurse wound follow up Wound type: pressure  Measurement:  Sacrum Stage 4 cms x 3 cms x 1 cm with 2 cms of undermining from 6 o'clock to 12 o'clock, 80% pink and moist 20% dark devitalized tissue, epibole of wound edges noted  R trochanter Stage 3 Pressure Injury 2 cms x 1.5 cms x 1 cm 100% pink and moist  R medial foot 0.5 cm x 0.5 cm x 0.1 cm 100% pink and moist  R lateral foot 1 cm x 1.5 cm Deep Tissue Injury  L lateral foot Stage 3 Pressure Injury 1 cm x 1 cm x 0.1 cm 100% pink and moist   Drainage (amount, consistency, odor)  serosanguinous from sacrum, R medial foot, L lateral foot; some tan exudate noted from R trochanter wound  Periwound: healing pink epithelium to all foot wounds except callous noted around R lateral foot DTI, scarring noted to sacral wound  Dressing procedure/placement/frequency:  Sacral and R trochanter moist to dry dressing changes twice daily as previously ordered.  All wounds to feet apply silicone foam dressings as per previous order.  Feet should remain in Prevalon boots for offloading of pressure points.    Patient remains on low air loss mattress for pressure redistribution and moisture management.   WOC nursing team will continue to follow this patient every 7 to 10 days for wound assessment.    Thank you,    Priscella Mann MSN, RN-BC, [employer] [phone]

## 2022-12-17 DIAGNOSIS — R627 Adult failure to thrive: Secondary | ICD-10-CM | POA: Diagnosis not present

## 2022-12-17 LAB — BASIC METABOLIC PANEL
Anion gap: 9 (ref 5–15)
BUN: 15 mg/dL (ref 8–23)
CO2: 22 mmol/L (ref 22–32)
Calcium: 8.4 mg/dL — ABNORMAL LOW (ref 8.9–10.3)
Chloride: 108 mmol/L (ref 98–111)
Creatinine, Ser: 0.57 mg/dL (ref 0.44–1.00)
GFR, Estimated: >60 mL/min (ref >60)
Glucose, Bld: 93 mg/dL (ref 70–99)
Potassium: 3.5 mmol/L (ref 3.5–5.1)
Sodium: 139 mmol/L (ref 135–145)

## 2022-12-17 MED ORDER — APIXABAN 2.5 MG PO TABS: 2.5000 mg | ORAL_TABLET | Freq: Two times a day (BID) | ORAL | Status: AC

## 2022-12-17 MED ORDER — METOPROLOL TARTRATE 25 MG PO TABS: 12.5000 mg | ORAL_TABLET | Freq: Two times a day (BID) | ORAL | Status: DC

## 2022-12-17 NOTE — Discharge Summary (Addendum)
Physician Discharge Summary  LAVRA IMLER ZOX:[id number] DOB: July 20, 1941 DOA: 08/25/2022  PCP: Yolanda Manges, DO  Admit date: 08/25/2022 Discharge date: 12/19/2022 Recommendations for Outpatient Follow-up:  Follow up with PCP in 1 weeks-call for appointment Please obtain BMP/CBC in one week  Discharge Dispo: [facility] long term ww/ Hospice Discharge Condition: Stable Code Status:   Code Status: DNR Diet recommendation:  Diet Order             DIET - DYS 1 Room service appropriate? Yes with Assist; Fluid consistency: Thin  Diet effective now                    Brief/Interim Summary: [name] is a 82 y.o. F with advanced Alzheimer's dementia, HTN, permAF on Eliquis, sent from rehab center with decreased responsiveness, found to have Na 167 and ESBL UTI.   Patient was treated with IV Ertapenem.She could not return to SNF because of unpaid bills, family is unable to take care of her at home.  She is unable to qualify for Medicaid currently for financial reasons.   Pt currently with wounds and poor oral intake. Usually non verbal, non mobile.CSW following closely, TOC has been discussing with patient and family, planning for [facility] hospice to follow-up at the facility-with plan to discharge to LTC facility. Significant events:12/25: Admitted from SNF on ertapenem, hypotonic fluids 1/3: Sodium corrected, completed Abx, medically ready but could not return to SNF due to unpaid bills 1/4-present:working with TOC to arrange safe disposition  On 4/13 patient developed sinus tachycardia and has been upto 120s 130 At this time blood pressure was soft and improved with fluids, heart rate is stable.  She appears more alert awake.  Plan is for discharge to [facility] with hospice services Nursing home has accepted the patient after her husband has made  arrangement/payment can be accepted today as per Child psychotherapist  Discharge Diagnoses:  Principal Problem:   Failure to  thrive in adult Active Problems:   GERD (gastroesophageal reflux disease)   Hypoalbuminemia due to protein-calorie malnutrition   AKI (acute kidney injury)   Permanent atrial fibrillation   Alzheimer's dementia   Pressure ulcer   Adult failure to thrive etiology much disease: Alzheimer's dementia end-stage: Seen by palliative care.Husband involved in the care and has been recommended to consider hospice/comfort care and planning to have hospice follow-up at the facility ,continues to have poor oral intake and failure to thrive.   Hypernatremia: Resolved, on hypotonic fluid> wean off ivf. Hypokalemia repleted Chronic atrial fibrillation/sinus tachycardia: currently controlled on flecainide, metoprolol changed to 12.5 twice daily prn 2/2 soft bp. Cont on Eliquis  ESBL WJX:BJYNWGNFA ertapenem, recheck UA 4/14 with pyuria but overall no leukocytosis afebrile . Has foley in place likely causing abnormal ua.   AKI: Resolved, at risk of recurrent AKI    Hypotension:BP has improved. Changed metoprolol to prns.     Chronic iron deficiency anemia:H&H stable   Pressure injury multiple sites as below  Sacrum stage IV Right hip, unstageable Left labia stage II Continue wound care per nursing  Thrombocytosis:Platelet downtrending.  Monitor   Goals of care DNR: Due to failure to thrive advanced dementia at risk of decompensation and readmission, plan is to follow-up hospice at the facility  Severe malnutrition augment diet as below Nutrition Problem: Severe Malnutrition Etiology: chronic illness Signs/Symptoms: severe muscle depletion, severe fat depletion Interventions: Ensure Enlive (each supplement provides 350kcal and 20 grams of protein), MVI  Consults: Wound  care, palliative care  Subjective: alert awake resting comfortably husband at the bedside, patient was being fed and drinking well  Discharge was held yesterday as patient's husband was not agreeable, social worker came back  and talk to the patient's husband at the facility.  Barriers solved and husband is agreeable can be discharged 4/18  Discharge Exam: Vitals:   12/19/22 0336 12/19/22 0810  BP: 125/76 103/77  Pulse: 97 88  Resp: 18 15  Temp: 99.1 F (37.3 C) 99 F (37.2 C)  SpO2: 97% 100%   General: Pt is alert, awake, not in acute distress Cardiovascular: RRR, S1/S2 +, no rubs, no gallops Respiratory: CTA bilaterally, no wheezing, no rhonchi Abdominal: Soft, NT, ND, bowel sounds + Extremities: no edema, no cyanosis  Discharge Instructions  Discharge Instructions     Discharge instructions   Complete by: As directed    Follow-up with [facility] hospice at the facility   Discharge wound care:   Complete by: As directed    2 times daily      Comments: Sacral and R trochanter wounds utilize moist to dry dressing changes twice daily:  Using Q tip applicator fill wound with moist to dry saline gauze twice daily, top with dry gauze and cover with Silicone foam.  Change foam dressing q3 days and prn soiling  12/10/22 1058     12/10/22 1058    Wound care  Every shift      Comments: Silicone foam dressings to all foot wounds.  Lift every shift to assess.  Change foam dressings q3 days and prn soiling.   Increase activity slowly   Complete by: As directed       Allergies as of 12/19/2022       Reactions   Antihistamines, Chlorpheniramine-type Palpitations   Tachycardia         Medication List     STOP taking these medications    metoprolol succinate 25 MG 24 hr tablet Commonly known as: TOPROL-XL   traZODone 50 MG tablet Commonly known as: DESYREL       TAKE these medications    acetaminophen 325 MG tablet Commonly known as: TYLENOL Take 2 tablets (650 mg total) by mouth every 6 (six) hours as needed for mild pain, fever or headache.   apixaban 2.5 MG Tabs tablet Commonly known as: ELIQUIS Take 1 tablet (2.5 mg total) by mouth 2 (two) times daily. What changed:  medication  strength how much to take   Ensure Take 237 mLs by mouth in the morning and at bedtime. Vanilla   ferrous sulfate 325 (65 FE) MG tablet Take 1 tablet (325 mg total) by mouth every other day.   flecainide 50 MG tablet Commonly known as: TAMBOCOR TAKE 1 TABLET BY MOUTH TWICE DAILY. APPOINTMENT REQUIRED FOR FUTURE REFILLS What changed: See the new instructions.   melatonin 5 MG Tabs Take 1 tablet (5 mg total) by mouth at bedtime as needed. What changed: reasons to take this   metoprolol tartrate 25 MG tablet Commonly known as: LOPRESSOR Take 0.5 tablets (12.5 mg total) by mouth 2 (two) times daily as needed (for hr >130).   multivitamin with minerals Tabs tablet Take 1 tablet by mouth daily.   omeprazole 40 MG capsule Commonly known as: PRILOSEC Take 40 mg by mouth daily.   polyethylene glycol 17 g packet Commonly known as: MIRALAX / GLYCOLAX Take 17 g by mouth daily as needed for mild constipation.   VITAMIN D-3 PO Take 1 capsule by  mouth daily.               Discharge Care Instructions  (From admission, onward)           Start     Ordered   12/17/22 0000  Discharge wound care:       Comments: 2 times daily      Comments: Sacral and R trochanter wounds utilize moist to dry dressing changes twice daily:  Using Q tip applicator fill wound with moist to dry saline gauze twice daily, top with dry gauze and cover with Silicone foam.  Change foam dressing q3 days and prn soiling  12/10/22 1058     12/10/22 1058    Wound care  Every shift      Comments: Silicone foam dressings to all foot wounds.  Lift every shift to assess.  Change foam dressings q3 days and prn soiling.   12/17/22 1134            Contact information for follow-up providers     Yolanda Manges, DO. Schedule an appointment as soon as possible for a visit in 2 week(s).   Specialty: Internal Medicine Why: for hospital follow-up Contact information: [address] [place] [address] [phone]         [facility] Follow up.   Specialty: Hospice and Palliative Medicine Why: [facility] will continue to follow and provide services at the facility. Contact information: [address] [facility] [place] [address] [phone]             Contact information for after-discharge care     Destination     [facility] .   Service: Skilled Nursing [address] [place] [address] [phone]                    Allergies  Allergen Reactions   Antihistamines, Chlorpheniramine-Type Palpitations    Tachycardia    The results of significant diagnostics from this hospitalization (including imaging, microbiology, ancillary and laboratory) are listed below for reference.    Microbiology: No results found for this or any previous visit (from the past 240 hour(s)).  Procedures/Studies: No results found. Labs: BNP (last 3 results) No results for input(s): "BNP" in the last 8760 hours. Basic Metabolic Panel: Recent Labs  Lab 12/13/22 0228 12/14/22 0415 12/15/22 0410 12/16/22 1140 12/17/22 0704  NA 149* 148* 141 137 139  K 3.4* 3.6 3.3* 2.9* 3.5  CL 110 116* 108 104 108  CO2 26 24 23 24 22   GLUCOSE 100* 127* 119* 95 93  BUN 29* 33* 27* 19 15  CREATININE 0.61 0.73 0.60 0.49 0.57  CALCIUM 9.0 8.5* 8.5* 8.2* 8.4*  MG 2.5* 2.7*  --   --   --    Recent Labs  Lab 12/14/22 0415 12/15/22 0410  WBC 10.6* 8.4  HGB 10.6* 10.1*  HCT 34.5* 32.9*  MCV 72.5* 72.3*  PLT 443* 405*  Anemia work up No results for input(s): "VITAMINB12", "FOLATE", "FERRITIN", "TIBC", "IRON", "RETICCTPCT" in the last 72 hours. Urinalysis    Component Value Date/Time   COLORURINE AMBER (A) 12/14/2022 1953   APPEARANCEUR CLOUDY (A) 12/14/2022 1953   LABSPEC 1.027 12/14/2022 1953   PHURINE 5.0 12/14/2022 1953   GLUCOSEU NEGATIVE 12/14/2022 1953   HGBUR LARGE (A) 12/14/2022 1953    BILIRUBINUR NEGATIVE 12/14/2022 1953   BILIRUBINUR negative 06/03/2016 1542   BILIRUBINUR neg 07/21/2014 1622   KETONESUR  NEGATIVE 12/14/2022 1953   PROTEINUR 100 (A) 12/14/2022 1953   UROBILINOGEN 0.2 06/03/2016 1542   UROBILINOGEN 0.2 12/20/2015 1535   NITRITE NEGATIVE 12/14/2022 1953   LEUKOCYTESUR LARGE (A) 12/14/2022 1953   Sepsis Labs Recent Labs  Lab 12/14/22 0415 12/15/22 0410  WBC 10.6* 8.4   Microbiology No results found for this or any previous visit (from the past 240 hour(s)).   Time coordinating discharge: 25 minutes  SIGNED: Lanae Boast, MD  [facility] 12/19/2022, 10:49 AM  If 7PM-7AM, please contact night-coverage [email or web address]

## 2022-12-17 NOTE — TOC Progression Note (Addendum)
Transition of Care [facility]) - Progression Note    Patient Details  Name: Donna Simmons MRN: [id number] Date of Birth: 1940/10/09  Transition of Care [facility]) CM/SW Contact  Teresina Bugaj A [place], [place] Phone Number: 12/17/2022, 12:42 PM  Clinical Narrative:   CSW spoke in person with pt's husband, Thurmond, and Nurse Case Pensions consultant, Jiles Crocker. CSW, pt's husband, and nurse case manger spoke with Montine Circle regarding concern of funding for long term care stay. Stanton Kidney ensured that funding would be honored and that pt's stay would be covered for the duration of the payment. She said pt was able to accept pt as early as today.  Pt's husband said "ok" to discharge to [facility]. Facility unable to accept pt this afternoon due to obtaining medications. Pt will discharge tomorrow morning.   CSW contacted pt's husband regarding pt's ability to discharge to facility. He said that he still have concerns regarding issues with finances due to the date of his check clearing. CSW has been in contact with facility to request conversation with husband. Situation ongoing.   CSW was contacted by Whitney Post at [facility]. She confirmed pt is able to be discharged to facility today. Discharge barriers resolved.    CSW contacted Paediatric nurse at [facility]. She said that pt is able to discharge to facility and provided bed number as well as contact information for report. CSW spoke with pt's husband regarding discharge and he said that Montine Circle in business office informed him that discharge would not be available until Monday due to check needing to be cleared. CSW contacted Montine Circle and left voicemail. CSW reached out to [place], facility liaison as well regarding discrepancy. Situation ongoing.      Barriers to Discharge: Financial Resources, Inadequate or no insurance, No SNF bed, SNF Authorization Denied, SNF Pending Medicaid RWB, Unsafe home situation, SNF Pending payor source - LOG, SNF Pending bed offer,  Barriers Unresolved (comment)  Expected Discharge Plan and Services     Post Acute Care Choice: Nursing Home Living arrangements for the past 2 months: Skilled Nursing Facility (SNF then hospital for 88 days) Expected Discharge Date: 12/17/22                                     Social Determinants of Health (SDOH) Interventions SDOH Screenings   Food Insecurity: Patient Declined (10/19/2022)  Alcohol Screen: Low Risk  (04/12/2019)  Depression (PHQ2-9): Low Risk  (11/05/2020)  Tobacco Use: Low Risk  (11/22/2022)    Readmission Risk Interventions     No data to display

## 2022-12-17 NOTE — NC FL2 (Signed)
[place] MEDICAID FL2 LEVEL OF CARE FORM     IDENTIFICATION  Patient Name: Donna Simmons Birthdate: 30-Jul-1941 Sex: female Admission Date (Current Location): 08/25/2022  [facility] and [place] Number:  Producer, television/film/video and Address:  The . [facility], [address] [place], [place] [address]      Provider Number: [id number]  Attending Physician Name and Address:  Lanae Boast, MD  Relative Name and Phone Number:       Current Level of Care: Hospital Recommended Level of Care: Skilled Nursing Facility Prior Approval Number:    Date Approved/Denied:   PASRR Number: [id number]  Discharge Plan: Other (Comment) (Long Term Care Facility)    Current Diagnoses: Patient Active Problem List   Diagnosis Date Noted   Pressure ulcer 11/22/2022   Hypernatremia 08/25/2022   Leukocytosis 08/25/2022   Thrombocytosis 08/25/2022   Hypokalemia 08/25/2022   Hypoalbuminemia due to protein-calorie malnutrition 08/25/2022   Transaminitis 08/25/2022   AKI (acute kidney injury) 08/25/2022   Permanent atrial fibrillation 08/25/2022   Iron deficiency anemia 08/25/2022   Alzheimer's dementia 08/25/2022   Insomnia 08/25/2022   Protein-calorie malnutrition, severe 07/03/2022   Pressure injury of skin 07/02/2022   Acute cystitis without hematuria    Failure to thrive in adult    Weakness 12/03/2021   GERD (gastroesophageal reflux disease)    Generalized weakness 10/14/2021   Bilateral lower extremity edema 08/19/2020   Other forms of angina pectoris 07/25/2018   Long term current use of anticoagulant therapy 11/18/2012   Hypercholesteremia 09/25/2012   DDD (degenerative disc disease), cervical 05/19/2012   Paroxysmal atrial fibrillation (HCC) - CHA2DS2-VASc Score 3, on Eliquis 04/20/2012   Essential hypertension 04/20/2012    Orientation RESPIRATION BLADDER Height & Weight     Self  Normal Incontinent (catheter) Weight: 81 lb (36.7 kg) Height:   (157.5  cm)  BEHAVIORAL SYMPTOMS/MOOD NEUROLOGICAL BOWEL NUTRITION STATUS      Incontinent Diet (see discharge summary)  AMBULATORY STATUS COMMUNICATION OF NEEDS Skin   Total Care Non-Verbally ([place]) PU Stage and Appropriate Care (Pressure injury right hip, full tissue thickness loss, assess every shift; Pressure injury left labia, loss of dermis partial thickness with shallow open injury, red, pink wound w/o slough; foot, anterior, pressure injury, medial)       PU Stage 4 Dressing: Daily (Change dressing 2x per day, dressing, foam)               Personal Care Assistance Level of Assistance  Bathing, Feeding, Dressing Bathing Assistance: Maximum assistance Feeding assistance: Maximum assistance Dressing Assistance: Maximum assistance     Functional Limitations Info  Sight, Hearing Sight Info: Impaired Hearing Info: Impaired      SPECIAL CARE FACTORS FREQUENCY                       Contractures Contractures Info: Not present    Additional Factors Info  Code Status, Allergies Code Status Info: DNR Allergies Info: Antihistamines, Chlorpheniramine-type           Current Medications (12/17/2022):  This is the current hospital active medication list Current Facility-Administered Medications  Medication Dose Route Frequency Provider Last Rate Last Admin   acetaminophen (TYLENOL) tablet 650 mg  650 mg Oral Q6H PRN Adefeso, Oladapo, DO   650 mg at 12/10/22 5621   Or   acetaminophen (TYLENOL) suppository 650 mg  650 mg Rectal Q6H PRN Adefeso, Oladapo, DO       apixaban (ELIQUIS)  tablet 2.5 mg  2.5 mg Oral BID Alberteen Sam, MD   2.5 mg at 12/17/22 1018   Chlorhexidine Gluconate Cloth 2 % PADS 6 each  6 each Topical Daily Albertine Grates, MD   6 each at 12/17/22 1019   dextrose 5 % solution   Intravenous Continuous Pahwani, Rinka R, MD 75 mL/hr at 12/16/22 0611 Infusion Verify at 12/16/22 0611   feeding supplement (ENSURE ENLIVE / ENSURE PLUS) liquid 237 mL  237 mL Oral TID BM  Regalado, Belkys A, MD   237 mL at 12/17/22 1018   ferrous sulfate tablet 325 mg  325 mg Oral QODAY Adefeso, Oladapo, DO   325 mg at 12/17/22 1018   flecainide (TAMBOCOR) tablet 50 mg  50 mg Oral BID Adefeso, Oladapo, DO   50 mg at 12/17/22 1018   lip balm (CARMEX) ointment   Topical PRN [place], Eric J, DO   75 Application at 10/16/22 1610   melatonin tablet 5 mg  5 mg Oral QHS PRN Adefeso, Oladapo, DO   5 mg at 12/03/22 2044   metoprolol tartrate (LOPRESSOR) injection 2.5 mg  2.5 mg Intravenous Once PRN John Giovanni, MD       metoprolol tartrate (LOPRESSOR) tablet 12.5 mg  12.5 mg Oral BID Vann, Jessica U, DO   12.5 mg at 12/17/22 1018   multivitamin with minerals tablet 1 tablet  1 tablet Oral Daily Leatha Gilding, MD   1 tablet at 12/17/22 1018   ondansetron (ZOFRAN) tablet 4 mg  4 mg Oral Q6H PRN Adefeso, Oladapo, DO       Or   ondansetron (ZOFRAN) injection 4 mg  4 mg Intravenous Q6H PRN Adefeso, Oladapo, DO       pantoprazole (PROTONIX) EC tablet 40 mg  40 mg Oral Daily Adefeso, Oladapo, DO   40 mg at 12/17/22 1018     Discharge Medications: Please see discharge summary for a list of discharge medications.  Relevant Imaging Results:  Relevant Lab Results:   Additional Information SSN- [id number]  Aidden Markovic A [place], [place]

## 2022-12-17 NOTE — Care Management Important Message (Signed)
Important Message  Patient Details  Name: Donna Simmons MRN: [id number] Date of Birth: 1941-04-06   Medicare Important Message Given:  Yes  Patient has a precaution order in place will mail a copy of this to the patient home address.   Lauro Manlove 12/17/2022, 2:58 PM

## 2022-12-17 NOTE — Plan of Care (Signed)

## 2022-12-18 MED ORDER — METOPROLOL TARTRATE 25 MG PO TABS: 12.5000 mg | ORAL_TABLET | Freq: Two times a day (BID) | ORAL | Status: AC | PRN

## 2022-12-18 NOTE — TOC Transition Note (Addendum)
Transition of Care [facility]) - CM/SW Discharge Note   Patient Details  Name: Donna Simmons MRN: [id number] Date of Birth: 09-16-1940  Transition of Care [facility]) CM/SW Contact:  Lockie Pares, RN Phone Number: 12/18/2022, 12:22 PM   Clinical Narrative:     Patient husband is appealing discharge. HINN 12 and notice of discharge verbally given to patients husband via phone due to remote. Each letter was verbally given to him verbatim. He declined to sign the HINN 12. Copies of both will be printed and brought into room.  Mr. Klasen was asked if he had any comments or questions regarding this. He stated "no". 1600: Spoke to Iris in CM office, appeal is still in clinical review    Barriers to Discharge: Financial Resources, Inadequate or no insurance, No SNF bed, SNF Authorization Denied, SNF Pending Medicaid RWB, Unsafe home situation, SNF Pending payor source - LOG, SNF Pending bed offer, Barriers Unresolved (comment)   Patient Goals and CMS Choice CMS Medicare.gov Compare Post Acute Care list provided to:: Patient Represenative (must comment) Choice offered to / list presented to : Spouse  Discharge Placement                         Discharge Plan and Services Additional resources added to the After Visit Summary for       Post Acute Care Choice: Nursing Home                               Social Determinants of Health (SDOH) Interventions SDOH Screenings   Food Insecurity: Patient Declined (10/19/2022)  Alcohol Screen: Low Risk  (04/12/2019)  Depression (PHQ2-9): Low Risk  (11/05/2020)  Tobacco Use: Low Risk  (11/22/2022)     Readmission Risk Interventions     No data to display

## 2022-12-18 NOTE — Progress Notes (Signed)
  Discharge was held yesterday as patient's husband was not agreeable, social worker came back and talked to the patient's husband at the facility.  Barriers solved and husband is agreeable can be discharged 4/18 Metoprolol cahnged to prn

## 2022-12-19 DIAGNOSIS — R627 Adult failure to thrive: Secondary | ICD-10-CM | POA: Diagnosis not present

## 2022-12-19 NOTE — TOC Transition Note (Signed)
Transition of Care [facility]) - CM/SW Discharge Note   Patient Details  Name: Donna Simmons MRN: [id number] Date of Birth: [birth year]  Transition of Care [facility]) CM/SW Contact:  Wateen Varon A [place], [place] Phone Number: 12/19/2022, 2:01 PM   Clinical Narrative:     Patient will DC to: [facility]  Anticipated DC date: 12/19/22  Family notified: Romero Belling  Transport by: Sharin Mons      Per MD patient ready for DC to [facility]. RN, patient, patient's family, and facility notified of DC. Discharge Summary and FL2 sent to facility. RN to call report prior to discharge (# for report [phone], room # 105). DC packet on chart. Ambulance transport requested for patient.     CSW will sign off for now as social work intervention is no longer needed. Please consult [place] again if new needs arise.   Final next level of care:  (Long Term Care) Barriers to Discharge: Barriers Resolved   Patient Goals and CMS Choice CMS Medicare.gov Compare Post Acute Care list provided to:: Patient Represenative (must comment) Choice offered to / list presented to : Spouse  Discharge Placement                Patient chooses bed at: [facility] Patient to be transferred to facility by: [facility] Name of family member notified: Thurmon Kamphaus Patient and family notified of of transfer: 12/19/22  Discharge Plan and Services Additional resources added to the After Visit Summary for       Post Acute Care Choice: Nursing Home                               Social Determinants of Health (SDOH) Interventions SDOH Screenings   Food Insecurity: Patient Declined (10/19/2022)  Alcohol Screen: Low Risk  (04/12/2019)  Depression (PHQ2-9): Low Risk  (11/05/2020)  Tobacco Use: Low Risk  (11/22/2022)     Readmission Risk Interventions     No data to display

## 2022-12-19 NOTE — Progress Notes (Signed)
PROGRESS NOTE Donna Simmons  YQM:[id number] DOB: 08/05/1941 DOA: 08/25/2022 PCP: Yolanda Manges, DO  Brief Narrative/Hospital Course: Donna Simmons is a 82 y.o. F with advanced Alzheimer's dementia, HTN, permAF on Eliquis, sent from rehab center with decreased responsiveness, found to have Na 167 and ESBL UTI.   Patient was treated with IV Ertapenem.She could not return to SNF because of unpaid bills, family is unable to take care of her at home.  She is unable to qualify for Medicaid currently for financial reasons.   Pt currently with wounds and poor oral intake. Usually non verbal, non mobile.CSW following closely, TOC has been discussing with patient and family, planning for [facility] hospice to follow-up at the facility-with plan to discharge to LTC facility. Significant events:12/25: Admitted from SNF on ertapenem, hypotonic fluids 1/3: Sodium corrected, completed Abx, medically ready but could not return to SNF due to unpaid bills 1/4-present:working with TOC to arrange safe disposition  On 4/13 patient developed sinus tachycardia and has been upto 120s 130 At this time blood pressure was soft and improved with fluids, heart rate is stable.  She appears more alert awake.  Plan is for discharge to [facility] with hospice services Patient's husband has appealed the discharge 4/18  Subjective: Seen and examined this morning Patient is alert awake Nurses feeding and was taking po Overnight patient has been afebrile BP stable no tachycardia   Assessment and Plan: Principal Problem:   Failure to thrive in adult Active Problems:   GERD (gastroesophageal reflux disease)   Hypoalbuminemia due to protein-calorie malnutrition   AKI (acute kidney injury)   Permanent atrial fibrillation   Alzheimer's dementia   Pressure ulcer  Adult failure to thrive etiology much disease: Alzheimer's dementia end-stage: Seen by palliative care.Husband involved in the care and has been recommended  to consider hospice/comfort care and planning to have hospice follow-up at the facility ,continues to have poor oral intake and failure to thrive.  She does eat and drink with assistance.   Hypernatremia: Resolved-off ivf  Hypokalemia resolved.    Chronic atrial fibrillation/sinus tachycardia: currently controlled on flecainide, metoprolol changed to 12.5 twice daily prn 2/2 soft bp. Cont on Eliquis   ESBL BMW:[id number] ertapenem, recheck UA 4/14 with pyuria but overall no leukocytosis afebrile.Has foley in place likely causing abnormal ua.  Monitor clinically   AKI: Resolved, at risk of recurrent AKI     Hypotension:BP has improved.  Keep metoprolol PRN   Chronic iron deficiency anemia:H&H stable-monitor interval generally   Pressure injury multiple sites as below  Sacrum stage IV Right hip, unstageable Left labia stage II: Continue regular wound care per nursing and monitor, continue offloading/frequent position changes   Thrombocytosis:Platelet downtrending.  Monitor    Goals of care DNR: Due to failure to thrive advanced dementia at risk of decompensation and readmission, plan is to follow-up hospice at the facility  Severe malnutrition augment diet as below Nutrition Problem: Severe Malnutrition Etiology: chronic illness Signs/Symptoms: severe muscle depletion, severe fat depletion Interventions: Ensure Enlive (each supplement provides 350kcal and 20 grams of protein), MVI   DVT prophylaxis: apixaban (ELIQUIS) tablet 2.5 mg Start: 12/03/22 2200 SCDs Start: 08/25/22 0659 Code Status:   Code Status: DNR Family Communication: plan of care discussed with patient at bedside. Patient status is: Inpatient because of unsafe disposition Level of care: Telemetry Medical   Dispo: Anticipated disposition: to [facility] w/ hospice care> husband had appealed the discharge, discharge has been updated and per social worker patient  be discharged to nursing home today before 2:00, husband  is aware and has accepted  Objective: Vitals last 24 hrs: Vitals:   12/18/22 1610 12/18/22 2002 12/19/22 0336 12/19/22 0810  BP: 111/65 111/71 125/76 103/77  Pulse: 98 93 97 88  Resp:  Temp: 97.8 F (36.6 C) 98.9 F (37.2 C) 99.1 F (37.3 C) 99 F (37.2 C)  TempSrc: Oral Oral Oral Axillary  SpO2:  98% 97% 100%  Weight:      Height:       Weight change:   Physical Examination: General exam: AA oriented to self, weak,older appearing HEENT:Oral mucosa moist, Ear/Nose WNL grossly, dentition normal. Respiratory system: bilaterally diminished BS, no use of accessory muscle Cardiovascular system: S1 & S2 +, regular rate,. Gastrointestinal system: Abdomen soft,NT,ND,BS+ Nervous System:Alert, awake, moving extremities, in fetal position Extremities: LE ankle edema neg, lower extremities warm Skin: No rashes,no icterus. MSK: Normal muscle bulk,tone, power   Medications reviewed:  Scheduled Meds:  apixaban  2.5 mg Oral BID   Chlorhexidine Gluconate Cloth  6 each Topical Daily   feeding supplement  237 mL Oral TID BM   ferrous sulfate  325 mg Oral QODAY   flecainide  50 mg Oral BID   metoprolol tartrate  12.5 mg Oral BID   multivitamin with minerals  1 tablet Oral Daily   pantoprazole  40 mg Oral Daily   Continuous Infusions:  dextrose 75 mL/hr at 12/16/22 0981   Diet Order             DIET - DYS 1 Room service appropriate? Yes with Assist; Fluid consistency: Thin  Diet effective now                    Nutrition Problem: Severe Malnutrition Etiology: chronic illness Signs/Symptoms: severe muscle depletion, severe fat depletion Interventions: Ensure Enlive (each supplement provides 350kcal and 20 grams of protein), MVI   Intake/Output Summary (Last 24 hours) at 12/19/2022 0842 Last data filed at 12/19/2022 0500 Gross per 24 hour  Intake --  Output 570 ml  Net -570 ml    Net IO Since Admission: 29,261.29 mL [12/19/22 0842]  Wt Readings from Last 3  Encounters:  12/16/22 36.7 kg  07/03/22 50.2 kg  04/24/22 54.4 kg     Unresulted Labs (From admission, onward)    None     Data Reviewed: I have personally reviewed following labs and imaging studies CBC: Recent Labs  Lab 12/14/22 0415 12/15/22 0410  WBC 10.6* 8.4  HGB 10.6* 10.1*  HCT 34.5* 32.9*  MCV 72.5* 72.3*  PLT 443* 405*    Basic Metabolic Panel: Recent Labs  Lab 12/13/22 0228 12/14/22 0415 12/15/22 0410 12/16/22 1140 12/17/22 0704  NA 149* 148* 141 137 139  K 3.4* 3.6 3.3* 2.9* 3.5  CL 110 116* 108 104 108  CO2 GLUCOSE 100* 127* 119* 95 93  BUN 29* 33* 27* 19 15  CREATININE 0.61 0.73 0.60 0.49 0.57  CALCIUM 9.0 8.5* 8.5* 8.2* 8.4*  MG 2.5* 2.7*  --   --   --    No results found for this or any previous visit (from the past 240 hour(s)).  Antimicrobials: Anti-infectives (From admission, onward)    Start     Dose/Rate Route Frequency Ordered Stop   11/28/22 2345  cefTRIAXone (ROCEPHIN) 1 g in sodium chloride 0.9 % 100 mL IVPB  Status:  Discontinued  1 g 200 mL/hr over 30 Minutes Intravenous Every 24 hours 11/28/22 2252 11/28/22 2254   09/03/22 1000  ertapenem (INVANZ) 1,000 mg in sodium chloride 0.9 % 100 mL IVPB        1 g 200 mL/hr over 30 Minutes Intravenous Every 24 hours 09/02/22 1421 09/04/22 2048   08/30/22 1500  ertapenem (INVANZ) 1,000 mg in sodium chloride 0.9 % 100 mL IVPB  Status:  Discontinued        1 g 200 mL/hr over 30 Minutes Intravenous Every 24 hours 08/30/22 1419 09/02/22 1421   08/29/22 1115  cefTRIAXone (ROCEPHIN) 1 g in sodium chloride 0.9 % 100 mL IVPB  Status:  Discontinued        1 g 200 mL/hr over 30 Minutes Intravenous Every 24 hours 08/29/22 1017 08/30/22 1419      Culture/Microbiology    Component Value Date/Time   SDES BLOOD RIGHT ARM 08/29/2022 1930   SPECREQUEST  08/29/2022 1930    BOTTLES DRAWN AEROBIC AND ANAEROBIC Blood Culture adequate volume   CULT  08/29/2022 1930    NO GROWTH 5  DAYS Performed at [facility], [address], [place], [place] [address]    REPTSTATUS 09/03/2022 FINAL 08/29/2022 1930  Radiology Studies: No results found.   LOS: 115 days   Lanae Boast, MD [facility]  12/19/2022, 8:42 AM

## 2022-12-19 NOTE — TOC Transition Note (Addendum)
Transition of Care [facility]) - CM/SW Discharge Note   Patient Details  Name: GREGORIA SELVY MRN: [id number] Date of Birth: [birth year]  Transition of Care [facility]) CM/SW Contact:  Lockie Pares, RN Phone Number: 12/19/2022, 10:35 AM   Clinical Narrative:     Received a call from [facility], case management. She stated that the appeal was denied by medicare. Team made aware via securechat. Called [place], patients husband to make his aware of appeal denial. She will go to [facility] today.  Final next level of care: Skilled Nursing Facility Barriers to Discharge: Financial Resources, Inadequate or no insurance, No SNF bed, SNF Authorization Denied, SNF Pending Medicaid RWB, Unsafe home situation, SNF Pending payor source - LOG, SNF Pending bed offer, Barriers Unresolved (comment)   Patient Goals and CMS Choice CMS Medicare.gov Compare Post Acute Care list provided to:: Patient Represenative (must comment) Choice offered to / list presented to : Spouse  Discharge Placement                         Discharge Plan and Services Additional resources added to the After Visit Summary for       Post Acute Care Choice: Nursing Home                               Social Determinants of Health (SDOH) Interventions SDOH Screenings   Food Insecurity: Patient Declined (10/19/2022)  Alcohol Screen: Low Risk  (04/12/2019)  Depression (PHQ2-9): Low Risk  (11/05/2020)  Tobacco Use: Low Risk  (11/22/2022)     Readmission Risk Interventions     No data to display

## 2023-04-02 DEATH — deceased
